# Patient Record
Sex: Male | Born: 1940
Health system: Southern US, Community
[De-identification: ages and names within clinical notes are randomized; demographics above are authoritative.]

## PROBLEM LIST (undated history)

## (undated) DIAGNOSIS — N400 Enlarged prostate without lower urinary tract symptoms: Secondary | ICD-10-CM

## (undated) DIAGNOSIS — R351 Nocturia: Secondary | ICD-10-CM

## (undated) DIAGNOSIS — K219 Gastro-esophageal reflux disease without esophagitis: Secondary | ICD-10-CM

## (undated) DIAGNOSIS — Z87448 Personal history of other diseases of urinary system: Secondary | ICD-10-CM

## (undated) DIAGNOSIS — C679 Malignant neoplasm of bladder, unspecified: Secondary | ICD-10-CM

## (undated) DIAGNOSIS — C419 Malignant neoplasm of bone and articular cartilage, unspecified: Secondary | ICD-10-CM

## (undated) DIAGNOSIS — Z860101 Personal history of adenomatous and serrated colon polyps: Secondary | ICD-10-CM

## (undated) DIAGNOSIS — R300 Dysuria: Secondary | ICD-10-CM

## (undated) DIAGNOSIS — Z8719 Personal history of other diseases of the digestive system: Secondary | ICD-10-CM

## (undated) DIAGNOSIS — Z86010 Personal history of colonic polyps: Secondary | ICD-10-CM

## (undated) DIAGNOSIS — Z8551 Personal history of malignant neoplasm of bladder: Secondary | ICD-10-CM

## (undated) HISTORY — DX: Benign prostatic hyperplasia without lower urinary tract symptoms: N40.0

## (undated) HISTORY — PX: COLONOSCOPY: SHX174

## (undated) HISTORY — DX: Gastro-esophageal reflux disease without esophagitis: K21.9

---

## 1995-08-17 HISTORY — PX: CARDIAC CATHETERIZATION: SHX172

## 2001-11-23 ENCOUNTER — Ambulatory Visit (HOSPITAL_COMMUNITY): Admission: RE | Admit: 2001-11-23 | Discharge: 2001-11-23 | Payer: Self-pay | Admitting: Family Medicine

## 2001-11-23 ENCOUNTER — Encounter: Payer: Self-pay | Admitting: Family Medicine

## 2002-11-01 ENCOUNTER — Emergency Department (HOSPITAL_COMMUNITY): Admission: EM | Admit: 2002-11-01 | Discharge: 2002-11-01 | Payer: Self-pay | Admitting: *Deleted

## 2002-12-02 ENCOUNTER — Emergency Department (HOSPITAL_COMMUNITY): Admission: EM | Admit: 2002-12-02 | Discharge: 2002-12-02 | Payer: Self-pay | Admitting: Internal Medicine

## 2003-04-23 ENCOUNTER — Encounter: Payer: Self-pay | Admitting: Internal Medicine

## 2003-04-23 ENCOUNTER — Ambulatory Visit (HOSPITAL_COMMUNITY): Admission: RE | Admit: 2003-04-23 | Discharge: 2003-04-23 | Payer: Self-pay | Admitting: Internal Medicine

## 2004-03-31 ENCOUNTER — Ambulatory Visit (HOSPITAL_COMMUNITY): Admission: RE | Admit: 2004-03-31 | Discharge: 2004-03-31 | Payer: Self-pay | Admitting: Family Medicine

## 2004-06-23 ENCOUNTER — Ambulatory Visit (HOSPITAL_COMMUNITY): Admission: RE | Admit: 2004-06-23 | Discharge: 2004-06-23 | Payer: Self-pay | Admitting: Family Medicine

## 2005-11-11 ENCOUNTER — Ambulatory Visit (HOSPITAL_COMMUNITY): Admission: RE | Admit: 2005-11-11 | Discharge: 2005-11-11 | Payer: Self-pay | Admitting: Family Medicine

## 2005-12-01 ENCOUNTER — Ambulatory Visit: Payer: Self-pay | Admitting: *Deleted

## 2005-12-14 ENCOUNTER — Ambulatory Visit: Payer: Self-pay | Admitting: *Deleted

## 2005-12-14 ENCOUNTER — Encounter (HOSPITAL_COMMUNITY): Admission: RE | Admit: 2005-12-14 | Discharge: 2006-01-13 | Payer: Self-pay | Admitting: *Deleted

## 2005-12-14 HISTORY — PX: CARDIOVASCULAR STRESS TEST: SHX262

## 2005-12-22 ENCOUNTER — Ambulatory Visit: Payer: Self-pay | Admitting: *Deleted

## 2005-12-25 ENCOUNTER — Ambulatory Visit: Payer: Self-pay | Admitting: Cardiology

## 2005-12-25 ENCOUNTER — Emergency Department (HOSPITAL_COMMUNITY): Admission: EM | Admit: 2005-12-25 | Discharge: 2005-12-25 | Payer: Self-pay | Admitting: Emergency Medicine

## 2006-09-20 ENCOUNTER — Ambulatory Visit (HOSPITAL_COMMUNITY): Admission: RE | Admit: 2006-09-20 | Discharge: 2006-09-20 | Payer: Self-pay | Admitting: General Surgery

## 2006-09-20 ENCOUNTER — Encounter (INDEPENDENT_AMBULATORY_CARE_PROVIDER_SITE_OTHER): Payer: Self-pay | Admitting: *Deleted

## 2007-01-30 ENCOUNTER — Ambulatory Visit (HOSPITAL_COMMUNITY): Admission: RE | Admit: 2007-01-30 | Discharge: 2007-01-30 | Payer: Self-pay | Admitting: Family Medicine

## 2007-07-07 ENCOUNTER — Ambulatory Visit (HOSPITAL_COMMUNITY): Admission: RE | Admit: 2007-07-07 | Discharge: 2007-07-07 | Payer: Self-pay | Admitting: Family Medicine

## 2007-07-17 ENCOUNTER — Encounter (HOSPITAL_COMMUNITY): Admission: RE | Admit: 2007-07-17 | Discharge: 2007-08-16 | Payer: Self-pay | Admitting: Family Medicine

## 2007-10-30 ENCOUNTER — Ambulatory Visit (HOSPITAL_COMMUNITY): Admission: RE | Admit: 2007-10-30 | Discharge: 2007-10-30 | Payer: Self-pay | Admitting: Family Medicine

## 2008-01-31 ENCOUNTER — Ambulatory Visit (HOSPITAL_COMMUNITY): Admission: RE | Admit: 2008-01-31 | Discharge: 2008-01-31 | Payer: Self-pay | Admitting: Family Medicine

## 2008-01-31 ENCOUNTER — Ambulatory Visit: Payer: Self-pay | Admitting: Cardiology

## 2008-01-31 ENCOUNTER — Encounter (INDEPENDENT_AMBULATORY_CARE_PROVIDER_SITE_OTHER): Payer: Self-pay | Admitting: Family Medicine

## 2008-01-31 HISTORY — PX: TRANSTHORACIC ECHOCARDIOGRAM: SHX275

## 2008-02-13 ENCOUNTER — Ambulatory Visit: Payer: Self-pay | Admitting: Cardiovascular Disease

## 2008-03-07 ENCOUNTER — Ambulatory Visit: Payer: Self-pay | Admitting: Internal Medicine

## 2008-03-07 DIAGNOSIS — J449 Chronic obstructive pulmonary disease, unspecified: Secondary | ICD-10-CM | POA: Insufficient documentation

## 2008-07-03 ENCOUNTER — Ambulatory Visit (HOSPITAL_COMMUNITY): Admission: RE | Admit: 2008-07-03 | Discharge: 2008-07-03 | Payer: Self-pay | Admitting: Family Medicine

## 2009-08-16 HISTORY — PX: CATARACT EXTRACTION W/ INTRAOCULAR LENS  IMPLANT, BILATERAL: SHX1307

## 2010-09-17 NOTE — Assessment & Plan Note (Signed)
Summary: Pulmonary Consultation/atypical copd   Visit Type:  Initial Consult Referred by:  Dr. Phillips Odor PCP:  Dr. Timoteo Expose  Chief Complaint:  Chest tightness.  History of Present Illness: 53 ywom quit smoking 1989 no trouble at all with breathing until about a year ago with a pattern of chest tightness that comes and goes and usually better within 15 min thinks he had a treadmill another one planned.  since 4/09 cough that goes away after a few minutes most ams white mucus assoc with chest tightness that resolves  after cough mucus up can do anything he wants with no chest tightness dancing,  up and down steps,  and digging ditches.  "Can do a treadmill all day":(but last did it over a month ago)  the pattern of tightness occurs4 to 5 days a week and may be related to when he eats his evening meal.  Today for example he reports not having a bout last ate at 430 yesterday   Pt denies any significant sore throat, nasal congestion or excess secretions, fever, chills, sweats, unintended wt loss, pleuritic or exertional cp, orthopnea pnd or leg swelling.  Pt also denies any obvious fluctuation in symptoms with weather or environmental change or other alleviating or aggravating factors.          Updated Prior Medication List: UROXATRAL 10 MG  XR24H-TAB (ALFUZOSIN HCL) two times a day  Current Allergies: No known allergies   Past Medical History:    Healthy   Family History:    no resp dz/atopy  Social History:    Retired    Married    Alcohol use-yes, 2-3 beers a day    Former smoker.  Quit 1989.  Smoked 1 ppd x 20 years.    Review of Systems  The patient denies anorexia, fever, weight loss, weight gain, vision loss, decreased hearing, hoarseness, chest pain, syncope, dyspnea on exertion, peripheral edema, headaches, hemoptysis, abdominal pain, melena, hematochezia, severe indigestion/heartburn, hematuria, incontinence, muscle weakness, suspicious skin lesions, transient  blindness, difficulty walking, depression, unusual weight change, abnormal bleeding, enlarged lymph nodes, and angioedema.     Vital Signs:  Patient Profile:   70 Years Old Male Weight:      206 pounds O2 Sat:      95 % O2 treatment:    Room Air Temp:     97.7 degrees F oral Pulse rate:   95 / minute BP sitting:   110 / 70  (left arm)  Vitals Entered By: Vernie Murders (March 07, 2008 10:57 AM)                 Physical Exam  elderly white male in no acute distress he seems somewhat frustrated by his inability to articulate his symptoms effectively HEENT mild turbinate edema.  Oropharynx no thrush or excess pnd or cobblestoning.  No JVD or cervical adenopathy. Mild accessory muscle hypertrophy. Trachea midline, nl thryroid. Chest was hyperinflated by percussion with diminished breath sounds and moderate increased exp time without wheeze. Hoover sign positive at mid inspiration. Regular rate and rhythm without murmur gallop or rub or increase P2.  Abd: no hsm, nl excursion. Ext warm without C,C or E.     CT of Chest  Procedure date:  01/31/2008  Findings:      calcified granuloma right lung with minimal bullous disease and scarring in the right lower lobe     Impression & Recommendations:  Problem # 1:  COPD UNSPECIFIED (ICD-496) he has clinical evidence of  COPD with bullous changes on his chest x-ray suggesting a a small component of emphysema.  Note however that his symptoms are markedly atypical and initially suggested angina to me until he told me that once he coughs up this morning mucus he has no symptoms for the rest of the day including with heavy activity.  However,  when respiratory symptoms begin well after a patient reports complete smoking cessation,  it is very hard to "blame" COPD  ie it doesn't make any more sense than hearing a  NASC[employer] driver wrecked his car while driving his kids to school or a Careers adviser sliced his hand off carving [place].  Once the high risk  activity stops,  the symptoms should not suddenly erupt.  If so, the differential diagnosis should include  obesity/deconditioning,  LPR/Reflux, CHF, or side effect of medications.  since he is the additional interesting history that  it makes a difference when he eats his evening meal as to whether he has morning symptoms, I suspect he is having nocturnal reflux and recommended a trial of Zegerid. If this eliminates  his symptoms, I gave him a prescription area otherwise at like to return here for a full set of PFTs and encouraged him to complete his cardiac workup. Orders: Consultation Level V [id number] [id number])   Medications Added to Medication List This Visit: 1)  Uroxatral 10 Mg Xr24h-tab (Alfuzosin hcl) .... Two times a day 2)  Zegerid 40-1100 Mg Caps (Omeprazole-sodium bicarbonate) .... One at bedtime   Patient Instructions: 1)  GERD (gastroesophageal reflux disease) was discussed. It is a common cause of respiratory symptoms. It commonly presents in the absence of heartburn. GERD can be treated with medication, but also with lifestyle changes including avoidance of late meals, excessive alcohol, smoking cessation, and avoid fatty foods, chocolate, peppermint, colas, red wine, and acidic juices such as orange juice.  2)  NO MINT OR MENTHOL PRODUCTS 3)  USE SUGARLESS CANDY INSTEAD (jolley ranchers)  4)  Please schedule a follow-up appointment in 3 weeks if not better   Prescriptions: ZEGERID 40-1100 MG  CAPS (OMEPRAZOLE-SODIUM BICARBONATE) one at bedtime  #30 x 0   Entered and Authorized by:   Nyoka Cowden MD   Signed by:   Nyoka Cowden MD on 03/07/2008   Method used:   Print then Give to Patient   RxID:   [id number]  ]

## 2010-12-29 NOTE — Assessment & Plan Note (Signed)
[facility]                       [place] CARDIOLOG[facility] OFFICE NOTE   SHAHEEN, MENDE                       MRN:          [id number]  DATE:02/13/2008                            DOB:          Jul 20, 1941    REFERRING PHYSICIAN:  Kirk Ruths, M.D.   Ms. Headen is seen today at the request of Dr. Regino Schultze.  He has previously  been seen by Dr. Dorethea Clan.  He apparently had heart catheterization at  [facility] 10 years ago which was normal.  He had some atypical chest pain 2  years ago and had a normal Myoview in 2007.  He has been having atypical  chest pain again.  It always recurs in the morning when he wakes up.  He  thinks he has some reflux.  He has had a cough for 3 months and had been  on antibiotics without much relief.  He says his allergies are worse  this year than ever before, particularly when cutting the grass.  The  patient is otherwise very active.  He golfs every day.  He shags.  He is  ambulatory and never gets any exertional chest pain.  There is no  associated diaphoresis.  He does have some mild shortness of breath with  his coughing, has not had any significant fevers.   He had previously been on metoprolol but this appears to have been  stopped.  I do not know whether it was due to some bronchospasm.   He has a history of hypertension.  Cholesterol status is unknown.  He is  a nondiabetic.   PAST MEDICAL HISTORY:  Otherwise remarkable for previous atypical chest  pain, question history of hiatal hernia and reflux.  Previous smoking.   ALLERGIES:  He denies any allergies.   MEDICATIONS:  He is currently not taking medication except for p.r.n.  ibuprofen.   FAMILY HISTORY:  Remarkable for both mother and father dying of  cirrhosis in their 18s.   SOCIAL HISTORY:  He is a nonsmoker, quitting over 20 years ago.  The  patient is retired.  He golfs on a regular basis.  He has one child who  lives in [place].  His wife's health is  fine.  He seems to be a fairly  outgoing guy but has a very unusual speech pattern with almost every  sentence beginning with listen here and let me tell you.   PHYSICAL EXAMINATION:  GENERAL:  Remarkable for healthy-appearing  elderly white male in no distress.  VITAL SIGNS:  His weight is 202, blood pressure is 150/90, pulse 80 and  regular, respiratory rate 14, afebrile.  HEENT:  Unremarkable.  NECK:  Carotids are normal without bruit.  No lymphadenopathy,  thyromegaly, or JVP elevation.  LUNGS:  Clear.  Good diaphragmatic motion.  No active wheezing.  S1 and  S2 with normal heart sounds.  PMI normal.  ABDOMEN:  Benign.  Bowel sounds positive.  No AAA.  No bruit.  No  hepatosplenomegaly or hepatojugular reflux.  EXTREMITIES:  Distal pulses are intact with minor varicosities.  NEUROLOGIC:  Nonfocal.  SKIN:  Warm and  dry.  MUSCULOSKELETAL:  No muscular weakness.   EKG is normal except for left axis deviation and compared to previous  EKG in April 2007.  There has been no significant change.   IMPRESSION:  1. Atypical chest pain, nonexertional, every morning, likely related      to reflux.  Consider starting H2 blocker and followup stress      Myoview.  2. Hypertension, previously treated with beta-blocker.  Consider      adding low-dose angiotensin-converting enzyme inhibitor and low-      salt diet.  3. Lower extremity varicosities, not symptomatic.  Consider referral      to [facility]; however, the patient would likely do fine with      observation and elevating the legs at the end of the day.  4. Overall I think the patient's the pain is atypical.  He certainly      is extremely active without chest pain with activity.  It may be      worthwhile to give him some block to elevate the head of his bed      with and start him on an H2 blocker.  5. Persistent upper respiratory illness with allergies and coughing      for last 3 months.  Consider chest CT to rule out  bronchiolitis      obliterans-organizing pneumonia.  Continue to follow up with Dr.      Regino Schultze in regards to atypical pneumonia versus flu.     Noralyn Pick. Eden Emms, MD, [facility]  Electronically Signed    PCN/MedQ  DD: 02/13/2008  DT: 02/14/2008  Job #: [id number]

## 2011-01-01 NOTE — Procedures (Signed)
NAMESALIOU, BARNIER NO.:  [id number]   MEDICAL RECORD NO.:  [id number]         PATIENT TYPE:  REC   LOCATION:  RAD                           FACILITY:  [facility]   PHYSICIAN:  Vida Roller, M.D.   DATE OF BIRTH:  [birth year]   DATE OF PROCEDURE:  DATE OF DISCHARGE:                                    STRESS TEST   HISTORY:  [name] is a 70 year old gentleman with normal coronaries by cath  10 years ago.  He now presents with complaints of exertional chest  discomfort.   Baseline data electrocardiogram reveals sinus rhythm at 74 beats per minute.  Blood pressure is 158/88.   Patient exercised for a total of 10 minutes 20 seconds Bruce protocol stage  IV at 12.8 METS, maximal heart rate was 142 which is at 91% of predicted  maximum, maximum blood pressure is 218/98 resolved down to 152/90 in  recovery.  The patient did describe substernal chest tightness rated as a  4/10 that resolved in less than 2 minutes in recovery.  EKG revealed few  PVCs, ventricular couplets, no ischemic changes were noted.   Final images and results are pending MD review.      [name], P.A. [facility]      Vida Roller, M.D.  Electronically Signed    AB/MEDQ  D:  12/14/2005  T:  12/14/2005  Job:  [id number]

## 2011-01-01 NOTE — H&P (Signed)
[name], [name] NO.:  [id number]   MEDICAL RECORD NO.:  [id number]           PATIENT TYPE:  AMB   LOCATION:  DAY                           FACILITY:  [facility]   PHYSICIAN:  Dalia Heading, M.D.  DATE OF BIRTH:  1940-09-16   DATE OF ADMISSION:  DATE OF DISCHARGE:  LH                              HISTORY & PHYSICAL   CHIEF COMPLAINT:  History of colon polyps.   HISTORY OF PRESENT ILLNESS:  The patient is a 70 year old white male who  presents for follow-up colonoscopy.  He last had a colonoscopy in 1997  for which he had some colon polyps removed.  He now presents for follow-  up colonoscopy.  Denies any nausea, vomiting, fever, diarrhea,  constipation, melena, or hematochezia.  There is no family history of  colon carcinoma.   PAST MEDICAL HISTORY:  Includes an anal fissure, neck difficulties.   CURRENT MEDICATIONS:  Benicar, Nexium, Vicoprofen.   ALLERGIES:  NO KNOWN DRUG ALLERGIES EXCEPT FOR STEROIDS WHICH MAKE HIM  LOOPY.   REVIEW OF SYSTEMS:  Noncontributory.   On physical examination, the patient is a well-developed, well-nourished  white male in no acute distress.  LUNGS:  Clear to auscultation with equal breath sounds bilaterally.  HEART EXAMINATION:  Reveals a regular rate and rhythm without S3, S4, or  murmurs.  ABDOMEN:  Is soft, nontender, nondistended.  RECTAL:  Examination was deferred to the procedure.   IMPRESSION:  History of colon polyps.   PLAN:  The patient is scheduled for colonoscopy on September 20, 2006.  Risks and benefits of the procedure including bleeding and perforation  were fully explained to the patient, who gave informed consent.      Dalia Heading, M.D.  Electronically Signed     MAJ/MEDQ  D:  09/08/2006  T:  09/08/2006  Job:  [id number]   cc:   [facility]  Fax: [phone]   Dalia Heading, M.D.  Fax: [phone]   Kirk Ruths, M.D.  Fax: [phone]

## 2011-01-01 NOTE — Consult Note (Signed)
[name], [name] NO.:  [id number]   MEDICAL RECORD NO.:  [id number]          PATIENT TYPE:  EMS   LOCATION:  MAJO                         FACILITY:  [facility]   PHYSICIAN:  Jesse Sans. Wall, M.D.   DATE OF BIRTH:  Sep 08, 1940   DATE OF CONSULTATION:  12/25/2005  DATE OF DISCHARGE:                                   CONSULTATION   REFERRING PHYSICIAN:  Dr. Carleene Cooper, ER physician.   PRIMARY CARE PHYSICIAN:  Dr. Dionicio Stall, [place]/Dr. Regino Schultze, [facility], Day.   REASON FOR CONSULTATION:  I was asked by Dr. Carleene Cooper to evaluate  [name], a delightful, very active, 70 year old, white male.  I met  [name] about 10 years ago when he had a catheterization which was  apparently normal.   He has been evaluate Dr. Dionicio Stall recently for some chest discomfort.  He  had a stress Myoview on an Dec 14, 2005, which showed him to exercise for 10  minutes and 20 seconds under Bruce protocol.  METS level was 12.8.  Heart  beat increased to 142 beats per minute.  Blood pressure was 158/88 and went  to 218/98.  He has some substernal chest tightness that recovered within 2  minutes.  He had no major are significant ST-segment changes.   He had a small area of inferior septal scar in the mid ventricle to the  apex.  There was mild inferior septal hypokinesia.  EF was 58%.  There is no  obvious ischemia.  It was felt to be a low-risk study with perhaps a remote  infarct.   He has had a lot of problems with his hiatal hernia and reflux.  About 4  weeks ago, he bent over after hitting golf balls and had some tightness in  the middle of his chest.  He subsequently, about 2 weeks ago, was putting  out mulch in his yard and had a similar event.  It came and went over a  period of a minute at a time.  This morning, he awoke with no chest pain,  but then as he was drinking a Coke, he began to have some tightness in his  chest and would come and go as  he drank the Coke.  It did not radiate into  his arm.  He did not have any shortness of breath or diaphoresis.   He does not take anything on regular basis for his reflux.  He uses p.r.n.  Rolaids.  He drinks a lot of sodas.  He denies any melena or  hematochezia.   He enjoys shagging and [employer] (that is a club) on a regular basis.  His  friend that is with him today says he can shag for hours without any chest  discomfort or shortness of breath.   CURRENT MEDICATIONS:  1.  He was placed on Benicar/hydrochlorothiazide 40/12.5 several days ago by      Dr. Dorethea Clan.  2.  He was also start on simvastatin 40 mg a day.   SOCIAL HISTORY:  He lives in [place].  FAMILY HISTORY:  Noncontributory.   PHYSICAL EXAMINATION:  VITAL SIGNS:  His blood pressure here was initially  114/69, temperature was 97.2, pulse 90 and regular, respiratory rate 20,  saturations were 95% on room air.  He has been stable in the emergency room  with his pressure.  GENERAL:  He is clearly in no acute distress.  SKIN:  His skin is warm and dry.  He is very pleasant.  NEUROLOGIC:  Intact.  HEENT:  His sclerae are clear.  PERLA.  Extraocular movements intact.  Facial symmetry is normal.  Carotids upstrokes are equal bilaterally without  bruits.  There is no JVD.  Thyroid is not enlarged.  Trachea is midline.  LUNGS:  Clear.  HEART:  Heart reveals a regular rate and rhythm without murmur or gallop.  ABDOMEN:  Soft.  There is no tenderness.  There is no epigastric bruit.  Bowel sounds are present.  There is no hepatosplenomegaly or tenderness.  EXTREMITIES:  There is no cyanosis, clubbing or edema.  Pulses are brisk.   LABORATORY DATA AND X-RAY FINDINGS:  Laboratory data is unremarkable  including cardiac enzyme care markers.  Electrolytes are normal.   EKG is completely normal.   ASSESSMENT:  I have had a long talk with [name] today.  I think most of  his symptoms are reflux.  He probably has had a remote  infarct in the past  and I agree with aggressive secondary preventative care implemented by Dr.  Dorethea Clan.  I have gone over extensively what to look out for as far as an  acute ischemic event and how to respond by calling 911.  I told him to be  aware of this, even though right now he seems to be perfectly stable.   RECOMMENDATIONS:  I am going to send him home from the emergency room.  I  have given him a Prevacid Solu-tab and gave him a prescription for Nexium 40  mg a day, #30, with one refill.  I have asked him to follow up with Dr.  Regino Schultze and Dr. Dorethea Clan.      Thomas C. Wall, M.D.  Electronically Signed     TCW/MEDQ  D:  12/25/2005  T:  12/27/2005  Job:  [id number]   cc:   Vida Roller, M.D.  Fax: [phone]   Kirk Ruths, M.D.  Fax: [phone]

## 2011-04-12 ENCOUNTER — Ambulatory Visit (HOSPITAL_COMMUNITY)
Admission: RE | Admit: 2011-04-12 | Discharge: 2011-04-12 | Disposition: A | Discharge status: Home / Self Care | Payer: BC Managed Care – PPO | Source: Ambulatory Visit | Attending: Family Medicine | Admitting: Family Medicine

## 2011-04-12 ENCOUNTER — Encounter (HOSPITAL_COMMUNITY): Payer: Self-pay

## 2011-04-12 ENCOUNTER — Other Ambulatory Visit (HOSPITAL_COMMUNITY): Payer: Self-pay | Admitting: Family Medicine

## 2011-04-12 DIAGNOSIS — R5381 Other malaise: Secondary | ICD-10-CM | POA: Insufficient documentation

## 2011-04-12 DIAGNOSIS — R059 Cough, unspecified: Secondary | ICD-10-CM

## 2011-04-12 DIAGNOSIS — I10 Essential (primary) hypertension: Secondary | ICD-10-CM

## 2011-04-12 DIAGNOSIS — R05 Cough: Secondary | ICD-10-CM

## 2011-04-12 DIAGNOSIS — R5383 Other fatigue: Secondary | ICD-10-CM

## 2011-08-24 DIAGNOSIS — I10 Essential (primary) hypertension: Secondary | ICD-10-CM | POA: Diagnosis not present

## 2011-08-24 DIAGNOSIS — Z6829 Body mass index (BMI) 29.0-29.9, adult: Secondary | ICD-10-CM | POA: Diagnosis not present

## 2011-08-24 DIAGNOSIS — Z23 Encounter for immunization: Secondary | ICD-10-CM | POA: Diagnosis not present

## 2011-08-24 DIAGNOSIS — M109 Gout, unspecified: Secondary | ICD-10-CM | POA: Diagnosis not present

## 2011-10-25 DIAGNOSIS — J069 Acute upper respiratory infection, unspecified: Secondary | ICD-10-CM | POA: Diagnosis not present

## 2011-10-25 DIAGNOSIS — G8929 Other chronic pain: Secondary | ICD-10-CM | POA: Diagnosis not present

## 2011-10-25 DIAGNOSIS — Z6829 Body mass index (BMI) 29.0-29.9, adult: Secondary | ICD-10-CM | POA: Diagnosis not present

## 2011-10-25 DIAGNOSIS — R059 Cough, unspecified: Secondary | ICD-10-CM | POA: Diagnosis not present

## 2011-10-25 DIAGNOSIS — N400 Enlarged prostate without lower urinary tract symptoms: Secondary | ICD-10-CM | POA: Diagnosis not present

## 2011-10-25 DIAGNOSIS — R05 Cough: Secondary | ICD-10-CM | POA: Diagnosis not present

## 2012-02-01 DIAGNOSIS — M109 Gout, unspecified: Secondary | ICD-10-CM | POA: Diagnosis not present

## 2012-02-01 DIAGNOSIS — N400 Enlarged prostate without lower urinary tract symptoms: Secondary | ICD-10-CM | POA: Diagnosis not present

## 2012-02-01 DIAGNOSIS — Z6829 Body mass index (BMI) 29.0-29.9, adult: Secondary | ICD-10-CM | POA: Diagnosis not present

## 2012-02-01 DIAGNOSIS — IMO0002 Reserved for concepts with insufficient information to code with codable children: Secondary | ICD-10-CM | POA: Diagnosis not present

## 2012-02-11 DIAGNOSIS — M5030 Other cervical disc degeneration, unspecified cervical region: Secondary | ICD-10-CM | POA: Diagnosis not present

## 2012-02-11 DIAGNOSIS — M542 Cervicalgia: Secondary | ICD-10-CM | POA: Diagnosis not present

## 2012-02-11 DIAGNOSIS — M47812 Spondylosis without myelopathy or radiculopathy, cervical region: Secondary | ICD-10-CM | POA: Diagnosis not present

## 2012-02-16 ENCOUNTER — Other Ambulatory Visit (HOSPITAL_COMMUNITY): Payer: Self-pay | Admitting: Neurosurgery

## 2012-02-16 DIAGNOSIS — M5030 Other cervical disc degeneration, unspecified cervical region: Secondary | ICD-10-CM

## 2012-02-16 DIAGNOSIS — M542 Cervicalgia: Secondary | ICD-10-CM

## 2012-02-16 DIAGNOSIS — M47812 Spondylosis without myelopathy or radiculopathy, cervical region: Secondary | ICD-10-CM

## 2012-02-22 ENCOUNTER — Ambulatory Visit (HOSPITAL_COMMUNITY)
Admission: RE | Admit: 2012-02-22 | Discharge: 2012-02-22 | Disposition: A | Discharge status: Home / Self Care | Payer: BC Managed Care – PPO | Source: Ambulatory Visit | Attending: Neurosurgery | Admitting: Neurosurgery

## 2012-02-22 DIAGNOSIS — M5030 Other cervical disc degeneration, unspecified cervical region: Secondary | ICD-10-CM

## 2012-02-22 DIAGNOSIS — M47812 Spondylosis without myelopathy or radiculopathy, cervical region: Secondary | ICD-10-CM

## 2012-02-22 DIAGNOSIS — M542 Cervicalgia: Secondary | ICD-10-CM | POA: Insufficient documentation

## 2012-02-22 DIAGNOSIS — M5020 Other cervical disc displacement, unspecified cervical region: Secondary | ICD-10-CM | POA: Insufficient documentation

## 2012-02-22 DIAGNOSIS — M4802 Spinal stenosis, cervical region: Secondary | ICD-10-CM | POA: Diagnosis not present

## 2012-03-03 DIAGNOSIS — M47812 Spondylosis without myelopathy or radiculopathy, cervical region: Secondary | ICD-10-CM | POA: Diagnosis not present

## 2012-03-06 ENCOUNTER — Other Ambulatory Visit: Payer: Self-pay | Admitting: Neurosurgery

## 2012-05-04 ENCOUNTER — Encounter (HOSPITAL_COMMUNITY): Admission: RE | Admit: 2012-05-04 | Payer: BC Managed Care – PPO | Source: Ambulatory Visit

## 2012-05-24 DIAGNOSIS — Z23 Encounter for immunization: Secondary | ICD-10-CM | POA: Diagnosis not present

## 2012-06-06 ENCOUNTER — Other Ambulatory Visit (HOSPITAL_COMMUNITY): Payer: BC Managed Care – PPO

## 2012-06-12 ENCOUNTER — Encounter (HOSPITAL_COMMUNITY): Admission: RE | Payer: Self-pay | Source: Ambulatory Visit

## 2012-06-12 ENCOUNTER — Ambulatory Visit (HOSPITAL_COMMUNITY): Admission: RE | Admit: 2012-06-12 | Payer: BC Managed Care – PPO | Source: Ambulatory Visit | Admitting: Neurosurgery

## 2012-06-12 SURGERY — ANTERIOR CERVICAL DECOMPRESSION/DISCECTOMY FUSION 1 LEVEL
Anesthesia: General

## 2012-06-13 DIAGNOSIS — L821 Other seborrheic keratosis: Secondary | ICD-10-CM | POA: Diagnosis not present

## 2012-06-13 DIAGNOSIS — D235 Other benign neoplasm of skin of trunk: Secondary | ICD-10-CM | POA: Diagnosis not present

## 2012-06-13 DIAGNOSIS — L570 Actinic keratosis: Secondary | ICD-10-CM | POA: Diagnosis not present

## 2012-08-14 DIAGNOSIS — Z6829 Body mass index (BMI) 29.0-29.9, adult: Secondary | ICD-10-CM | POA: Diagnosis not present

## 2012-08-14 DIAGNOSIS — E785 Hyperlipidemia, unspecified: Secondary | ICD-10-CM | POA: Diagnosis not present

## 2012-08-14 DIAGNOSIS — G8929 Other chronic pain: Secondary | ICD-10-CM | POA: Diagnosis not present

## 2012-08-14 DIAGNOSIS — I10 Essential (primary) hypertension: Secondary | ICD-10-CM | POA: Diagnosis not present

## 2012-09-19 DIAGNOSIS — I10 Essential (primary) hypertension: Secondary | ICD-10-CM | POA: Diagnosis not present

## 2012-09-19 DIAGNOSIS — G8929 Other chronic pain: Secondary | ICD-10-CM | POA: Diagnosis not present

## 2012-09-19 DIAGNOSIS — L821 Other seborrheic keratosis: Secondary | ICD-10-CM | POA: Diagnosis not present

## 2012-09-19 DIAGNOSIS — L259 Unspecified contact dermatitis, unspecified cause: Secondary | ICD-10-CM | POA: Diagnosis not present

## 2012-09-19 DIAGNOSIS — E785 Hyperlipidemia, unspecified: Secondary | ICD-10-CM | POA: Diagnosis not present

## 2012-12-26 DIAGNOSIS — L570 Actinic keratosis: Secondary | ICD-10-CM | POA: Diagnosis not present

## 2012-12-26 DIAGNOSIS — D235 Other benign neoplasm of skin of trunk: Secondary | ICD-10-CM | POA: Diagnosis not present

## 2012-12-26 DIAGNOSIS — L821 Other seborrheic keratosis: Secondary | ICD-10-CM | POA: Diagnosis not present

## 2013-01-05 DIAGNOSIS — N400 Enlarged prostate without lower urinary tract symptoms: Secondary | ICD-10-CM | POA: Diagnosis not present

## 2013-01-05 DIAGNOSIS — Z6828 Body mass index (BMI) 28.0-28.9, adult: Secondary | ICD-10-CM | POA: Diagnosis not present

## 2013-01-05 DIAGNOSIS — K5289 Other specified noninfective gastroenteritis and colitis: Secondary | ICD-10-CM | POA: Diagnosis not present

## 2013-01-05 DIAGNOSIS — I10 Essential (primary) hypertension: Secondary | ICD-10-CM | POA: Diagnosis not present

## 2013-05-23 DIAGNOSIS — Z23 Encounter for immunization: Secondary | ICD-10-CM | POA: Diagnosis not present

## 2013-09-11 DIAGNOSIS — R059 Cough, unspecified: Secondary | ICD-10-CM | POA: Diagnosis not present

## 2013-09-11 DIAGNOSIS — Z6829 Body mass index (BMI) 29.0-29.9, adult: Secondary | ICD-10-CM | POA: Diagnosis not present

## 2013-09-11 DIAGNOSIS — R05 Cough: Secondary | ICD-10-CM | POA: Diagnosis not present

## 2013-09-11 DIAGNOSIS — I10 Essential (primary) hypertension: Secondary | ICD-10-CM | POA: Diagnosis not present

## 2013-09-11 DIAGNOSIS — G8929 Other chronic pain: Secondary | ICD-10-CM | POA: Diagnosis not present

## 2013-11-20 DIAGNOSIS — G8929 Other chronic pain: Secondary | ICD-10-CM | POA: Diagnosis not present

## 2013-11-20 DIAGNOSIS — Z6829 Body mass index (BMI) 29.0-29.9, adult: Secondary | ICD-10-CM | POA: Diagnosis not present

## 2013-11-20 DIAGNOSIS — M779 Enthesopathy, unspecified: Secondary | ICD-10-CM | POA: Diagnosis not present

## 2013-12-14 DIAGNOSIS — M109 Gout, unspecified: Secondary | ICD-10-CM | POA: Diagnosis not present

## 2014-01-01 DIAGNOSIS — R5383 Other fatigue: Secondary | ICD-10-CM | POA: Diagnosis not present

## 2014-01-01 DIAGNOSIS — R5381 Other malaise: Secondary | ICD-10-CM | POA: Diagnosis not present

## 2014-01-01 DIAGNOSIS — Z6829 Body mass index (BMI) 29.0-29.9, adult: Secondary | ICD-10-CM | POA: Diagnosis not present

## 2014-01-01 DIAGNOSIS — M109 Gout, unspecified: Secondary | ICD-10-CM | POA: Diagnosis not present

## 2014-04-09 DIAGNOSIS — L258 Unspecified contact dermatitis due to other agents: Secondary | ICD-10-CM | POA: Diagnosis not present

## 2014-06-12 DIAGNOSIS — I10 Essential (primary) hypertension: Secondary | ICD-10-CM | POA: Diagnosis not present

## 2014-06-12 DIAGNOSIS — Z6829 Body mass index (BMI) 29.0-29.9, adult: Secondary | ICD-10-CM | POA: Diagnosis not present

## 2014-06-12 DIAGNOSIS — G894 Chronic pain syndrome: Secondary | ICD-10-CM | POA: Diagnosis not present

## 2014-06-12 DIAGNOSIS — J0100 Acute maxillary sinusitis, unspecified: Secondary | ICD-10-CM | POA: Diagnosis not present

## 2014-06-12 DIAGNOSIS — Z23 Encounter for immunization: Secondary | ICD-10-CM | POA: Diagnosis not present

## 2014-06-12 DIAGNOSIS — J209 Acute bronchitis, unspecified: Secondary | ICD-10-CM | POA: Diagnosis not present

## 2014-08-12 ENCOUNTER — Other Ambulatory Visit (HOSPITAL_COMMUNITY): Payer: Self-pay | Admitting: Internal Medicine

## 2014-08-12 DIAGNOSIS — G894 Chronic pain syndrome: Secondary | ICD-10-CM | POA: Diagnosis not present

## 2014-08-12 DIAGNOSIS — R1032 Left lower quadrant pain: Secondary | ICD-10-CM | POA: Diagnosis not present

## 2014-08-12 DIAGNOSIS — R1084 Generalized abdominal pain: Secondary | ICD-10-CM

## 2014-08-12 DIAGNOSIS — I10 Essential (primary) hypertension: Secondary | ICD-10-CM | POA: Diagnosis not present

## 2014-08-12 DIAGNOSIS — E663 Overweight: Secondary | ICD-10-CM | POA: Diagnosis not present

## 2014-08-12 DIAGNOSIS — K219 Gastro-esophageal reflux disease without esophagitis: Secondary | ICD-10-CM | POA: Diagnosis not present

## 2014-08-12 DIAGNOSIS — K5900 Constipation, unspecified: Secondary | ICD-10-CM | POA: Diagnosis not present

## 2014-08-12 DIAGNOSIS — Z6829 Body mass index (BMI) 29.0-29.9, adult: Secondary | ICD-10-CM | POA: Diagnosis not present

## 2014-08-12 DIAGNOSIS — R109 Unspecified abdominal pain: Secondary | ICD-10-CM | POA: Diagnosis not present

## 2014-08-13 ENCOUNTER — Ambulatory Visit (HOSPITAL_COMMUNITY)
Admission: RE | Admit: 2014-08-13 | Discharge: 2014-08-13 | Disposition: A | Discharge status: Home / Self Care | Payer: Medicare Other | Source: Ambulatory Visit | Attending: Internal Medicine | Admitting: Internal Medicine

## 2014-08-13 DIAGNOSIS — N3289 Other specified disorders of bladder: Secondary | ICD-10-CM | POA: Diagnosis not present

## 2014-08-13 DIAGNOSIS — R197 Diarrhea, unspecified: Secondary | ICD-10-CM | POA: Insufficient documentation

## 2014-08-13 DIAGNOSIS — N261 Atrophy of kidney (terminal): Secondary | ICD-10-CM | POA: Diagnosis not present

## 2014-08-13 DIAGNOSIS — I2510 Atherosclerotic heart disease of native coronary artery without angina pectoris: Secondary | ICD-10-CM | POA: Diagnosis not present

## 2014-08-13 DIAGNOSIS — R1084 Generalized abdominal pain: Secondary | ICD-10-CM | POA: Insufficient documentation

## 2014-08-13 DIAGNOSIS — R109 Unspecified abdominal pain: Secondary | ICD-10-CM | POA: Diagnosis not present

## 2014-08-13 DIAGNOSIS — R102 Pelvic and perineal pain: Secondary | ICD-10-CM | POA: Diagnosis not present

## 2014-08-13 MED ORDER — IOHEXOL 300 MG/ML  SOLN
100.0000 mL | Freq: Once | INTRAMUSCULAR | Status: AC | PRN
  Administered 2014-08-13: 100 mL via INTRAVENOUS

## 2014-08-28 ENCOUNTER — Telehealth: Payer: Self-pay | Admitting: Gastroenterology

## 2014-08-28 NOTE — Telephone Encounter (Signed)
Rec'd from [place] forward 5 pages to Dr.Stark

## 2014-09-10 ENCOUNTER — Encounter: Payer: Self-pay | Admitting: Gastroenterology

## 2014-09-10 ENCOUNTER — Ambulatory Visit (INDEPENDENT_AMBULATORY_CARE_PROVIDER_SITE_OTHER): Payer: Medicare Other | Admitting: Gastroenterology

## 2014-09-10 VITALS — BP 132/72 | HR 68 | Ht 70.0 in | Wt 212.1 lb

## 2014-09-10 DIAGNOSIS — K219 Gastro-esophageal reflux disease without esophagitis: Secondary | ICD-10-CM

## 2014-09-10 DIAGNOSIS — Z86010 Personal history of colonic polyps: Secondary | ICD-10-CM

## 2014-09-10 DIAGNOSIS — R933 Abnormal findings on diagnostic imaging of other parts of digestive tract: Secondary | ICD-10-CM | POA: Diagnosis not present

## 2014-09-10 MED ORDER — PEG-KCL-NACL-NASULF-NA ASC-C 100 G PO SOLR: 1.0000 | Freq: Once | ORAL | Status: DC

## 2014-09-10 NOTE — Progress Notes (Signed)
History of Present Illness: This is a 74 year old male referred by Dr. Gerarda Fraction for evaluation of periumbilical abdominal pain and an abnormal CT scan. He relates about 6 weeks ago periumbilical abdominal pain started occurring intermittently. He was evaluated by Dr. Riley Kill and underwent CT scan below findings. He was advised to discontinue all milk products and after discontinuing ice cream his symptoms completely resolved. Currently has no gastrointestinal complaints. He previously underwent colonoscopy by Dr. Arnoldo Morale in February 2008 showing small adenomatous colon polyp. 3 year surveillance colonoscopy was recommended however that has not been completed. Denies weight loss, constipation, diarrhea, change in stool caliber, melena, hematochezia, nausea, vomiting, dysphagia, reflux symptoms, chest pain.  ABD/PELVIC CT 08/13/14 IMPRESSION: Suggestion of mild diffuse wall thickening of proximal and mid small bowel loops -question enteritis of uncertain etiology. No evidence of abscess, pneumoperitoneum or bowel obstruction. The visualized mesenteric vasculature are patent.  Two areas of focal left bladder wall thickening. Bladder neoplasm not excluded and urologic consultation is recommended.  Coronary artery disease and mild bilateral renal cortical atrophy.   Allergies  Allergen Reactions  . Ciprofloxacin Other (See Comments)    Pt felt bloated   Outpatient Prescriptions Prior to Visit  Medication Sig Dispense Refill  . HYDROcodone-ibuprofen (VICOPROFEN) 7.5-200 MG per tablet Take 1 tablet by mouth every 6 (six) hours as needed for moderate pain.    . tamsulosin (FLOMAX) 0.4 MG CAPS capsule Take 0.4 mg by mouth 2 (two) times daily.    Marland Kitchen allopurinol (ZYLOPRIM) 100 MG tablet Take 100 mg by mouth daily.    Marland Kitchen aspirin 81 MG tablet Take 81 mg by mouth daily.    Marland Kitchen olmesartan-hydrochlorothiazide (BENICAR HCT) 40-12.5 MG per tablet Take 1 tablet by mouth daily.    Marland Kitchen omeprazole (PRILOSEC) 40 MG  capsule Take 40 mg by mouth daily.    . predniSONE (DELTASONE) 5 MG tablet Take 5 mg by mouth daily with breakfast.    . sildenafil (VIAGRA) 100 MG tablet Take 100 mg by mouth daily as needed for erectile dysfunction.     No facility-administered medications prior to visit.   Past Medical History  Diagnosis Date  . Hypertension   . Tubular adenoma of colon 2008  . BPH (benign prostatic hypertrophy)   . GERD (gastroesophageal reflux disease)    Past Surgical History  Procedure Laterality Date  . No past surgeries     History   Social History  . Marital Status: Married    Spouse Name: N/A    Number of Children: 1  . Years of Education: N/A   Occupational History  . Retired    Social History Main Topics  . Smoking status: Former Smoker    Types: Cigarettes  . Smokeless tobacco: Never Used  . Alcohol Use: 0.0 oz/week    0 Not specified per week     Comment: 2 beers a night  . Drug Use: No  . Sexual Activity: None   Other Topics Concern  . None   Social History Narrative   Family History  Problem Relation Age of Onset  . Colon cancer Neg Hx   . Colon polyps Neg Hx   . Diabetes Neg Hx   . Kidney disease Neg Hx   . Esophageal cancer Neg Hx   . Heart disease Neg Hx   . Gallbladder disease Neg Hx      Review of Systems: Pertinent positive and negative review of systems were noted in the above HPI section. All other  review of systems were otherwise negative.    Physical Exam: General: Well developed , well nourished, no acute distress Head: Normocephalic and atraumatic Eyes:  sclerae anicteric, EOMI Ears: Normal auditory acuity Mouth: No deformity or lesions Neck: Supple, no masses or thyromegaly Lungs: Clear throughout to auscultation Heart: Regular rate and rhythm; no murmurs, rubs or bruits Abdomen: Soft, non tender and non distended. No masses, hepatosplenomegaly or hernias noted. Normal Bowel sounds Rectal:  Deferred to colonoscopy Musculoskeletal:  Symmetrical with no gross deformities  Skin: No lesions on visible extremities Pulses:  Normal pulses noted Extremities: No clubbing, cyanosis, edema or deformities noted Neurological: Alert oriented x 4, grossly nonfocal Cervical Nodes:  No significant cervical adenopathy Inguinal Nodes: No significant inguinal adenopathy Psychological:  Alert and cooperative. Normal mood and affect  Assessment and Recommendations:  1. Periumbilical abdominal pain, resolved. Lactose intolerance. Abnormal CT scan of small bowel. He may have had a self-limited enteritis or the findings may be a CT artifact. Continue to avoid milk products. Schedule CT enteroscopy in several weeks to allow time for interval healing of a suspected self-limited process.  2. Personal history of adenomatous colon polyps. Overdue for surveillance colonoscopy. The risks, benefits, and alternatives to colonoscopy with possible biopsy and possible polypectomy were discussed with the patient and they consent to proceed.

## 2014-09-10 NOTE — Patient Instructions (Addendum)
Please come directly to the basement the week prior to your CT scan to have blood work done.   You have been scheduled for a CT enterography scan of the abdomen and pelvis at [facility] are scheduled on 10/29/14 at 10:15am. You should arrive at 9:00am . Please follow the written instructions below on the day of your exam:  WARNING: IF YOU ARE ALLERGIC TO IODINE/X-RAY DYE, PLEASE NOTIFY RADIOLOGY IMMEDIATELY AT [phone]! YOU WILL BE GIVEN A 13 HOUR PREMEDICATION PREP.  1) Do not eat or drink anything after midnight.  The purpose of you drinking the oral contrast is to aid in the visualization of your intestinal tract. The contrast solution may cause some diarrhea. Before your exam is started, you will be given a small amount of fluid to drink. Depending on your individual set of symptoms, you may also receive an intravenous injection of x-ray contrast/dye. Plan on being at [facility] for 30 minutes or long, depending on the type of exam you are having performed.   If you have any questions regarding your exam or if you need to reschedule, you may call the CT department at [phone] between the hours of 8:00 am and 5:00 pm, Monday-Friday.  ________________________________________________________________________  [name] have been scheduled for a colonoscopy. Please follow written instructions given to you at your visit today.  Please pick up your prep kit at the pharmacy within the next 1-3 days. If you use inhalers (even only as needed), please bring them with you on the day of your procedure. Your physician has requested that you go to www.startemmi.com and enter the access code given to you at your visit today. This web site gives a general overview about your procedure. However, you should still follow specific instructions given to you by our office regarding your preparation for the procedure.   Please remain on a Lactose free diet.   Thank you for choosing me  and [facility].  [name] Dagoberto Ligas., MD., [facility]  cc: [name], MD

## 2014-09-17 ENCOUNTER — Encounter: Payer: Self-pay | Admitting: Gastroenterology

## 2014-09-17 NOTE — Telephone Encounter (Signed)
Error

## 2014-10-17 ENCOUNTER — Encounter: Payer: Medicare Other | Admitting: Gastroenterology

## 2014-10-24 ENCOUNTER — Other Ambulatory Visit (INDEPENDENT_AMBULATORY_CARE_PROVIDER_SITE_OTHER): Payer: Medicare Other

## 2014-10-24 DIAGNOSIS — R933 Abnormal findings on diagnostic imaging of other parts of digestive tract: Secondary | ICD-10-CM | POA: Diagnosis not present

## 2014-10-24 LAB — CREATININE, SERUM: Creatinine, Ser: 1.06 mg/dL (ref 0.40–1.50)

## 2014-10-24 LAB — BUN: BUN: 9 mg/dL (ref 6–23)

## 2014-10-29 ENCOUNTER — Ambulatory Visit (HOSPITAL_COMMUNITY)
Admission: RE | Admit: 2014-10-29 | Discharge: 2014-10-29 | Disposition: A | Discharge status: Home / Self Care | Payer: Medicare Other | Source: Ambulatory Visit | Attending: Gastroenterology | Admitting: Gastroenterology

## 2014-10-29 ENCOUNTER — Other Ambulatory Visit: Payer: Medicare Other

## 2014-10-29 DIAGNOSIS — R933 Abnormal findings on diagnostic imaging of other parts of digestive tract: Secondary | ICD-10-CM

## 2014-11-05 ENCOUNTER — Encounter: Payer: Medicare Other | Admitting: Gastroenterology

## 2014-11-05 ENCOUNTER — Ambulatory Visit (AMBULATORY_SURGERY_CENTER): Payer: Medicare Other | Admitting: Gastroenterology

## 2014-11-05 ENCOUNTER — Encounter: Payer: Self-pay | Admitting: Gastroenterology

## 2014-11-05 VITALS — BP 138/84 | HR 68 | Temp 98.3°F | Resp 20 | Ht 70.0 in | Wt 212.0 lb

## 2014-11-05 DIAGNOSIS — D123 Benign neoplasm of transverse colon: Secondary | ICD-10-CM | POA: Diagnosis not present

## 2014-11-05 DIAGNOSIS — D122 Benign neoplasm of ascending colon: Secondary | ICD-10-CM

## 2014-11-05 DIAGNOSIS — Z86010 Personal history of colonic polyps: Secondary | ICD-10-CM | POA: Diagnosis not present

## 2014-11-05 DIAGNOSIS — D124 Benign neoplasm of descending colon: Secondary | ICD-10-CM

## 2014-11-05 DIAGNOSIS — D125 Benign neoplasm of sigmoid colon: Secondary | ICD-10-CM | POA: Diagnosis not present

## 2014-11-05 DIAGNOSIS — D120 Benign neoplasm of cecum: Secondary | ICD-10-CM

## 2014-11-05 DIAGNOSIS — J449 Chronic obstructive pulmonary disease, unspecified: Secondary | ICD-10-CM | POA: Diagnosis not present

## 2014-11-05 DIAGNOSIS — I10 Essential (primary) hypertension: Secondary | ICD-10-CM | POA: Diagnosis not present

## 2014-11-05 MED ORDER — SODIUM CHLORIDE 0.9 % IV SOLN: 500.0000 mL | INTRAVENOUS | Status: DC

## 2014-11-05 NOTE — Patient Instructions (Signed)

## 2014-11-05 NOTE — Progress Notes (Signed)
To recovery, report to Hodges, RN, VSS 

## 2014-11-05 NOTE — Progress Notes (Signed)
Called to room to assist during endoscopic procedure.  Patient ID and intended procedure confirmed with present staff. Received instructions for my participation in the procedure from the performing physician.  

## 2014-11-06 ENCOUNTER — Telehealth: Payer: Self-pay | Admitting: *Deleted

## 2014-11-06 NOTE — Telephone Encounter (Signed)
  Follow up Call-  Call back number 11/05/2014  Post procedure Call Back phone  # [phone]  Permission to leave phone message Yes     Patient questions:  Do you have a fever, pain , or abdominal swelling? No. Pain Score  0 *  Have you tolerated food without any problems? Yes.    Have you been able to return to your normal activities? Yes.    Do you have any questions about your discharge instructions: Diet   No. Medications  No. Follow up visit  No.  Do you have questions or concerns about your Care? No.  Actions: * If pain score is 4 or above: No action needed, pain <4.

## 2014-11-07 ENCOUNTER — Ambulatory Visit (HOSPITAL_COMMUNITY): Payer: No Typology Code available for payment source

## 2014-11-14 ENCOUNTER — Encounter: Payer: Self-pay | Admitting: Gastroenterology

## 2014-11-26 ENCOUNTER — Ambulatory Visit (HOSPITAL_COMMUNITY)
Admission: RE | Admit: 2014-11-26 | Discharge: 2014-11-26 | Disposition: A | Discharge status: Home / Self Care | Payer: Medicare Other | Source: Ambulatory Visit | Attending: Gastroenterology | Admitting: Gastroenterology

## 2014-11-26 DIAGNOSIS — R933 Abnormal findings on diagnostic imaging of other parts of digestive tract: Secondary | ICD-10-CM | POA: Diagnosis not present

## 2014-11-26 DIAGNOSIS — R109 Unspecified abdominal pain: Secondary | ICD-10-CM | POA: Insufficient documentation

## 2014-11-26 DIAGNOSIS — K760 Fatty (change of) liver, not elsewhere classified: Secondary | ICD-10-CM | POA: Diagnosis not present

## 2014-11-26 DIAGNOSIS — K7689 Other specified diseases of liver: Secondary | ICD-10-CM | POA: Diagnosis not present

## 2014-11-26 MED ORDER — BARIUM SULFATE 0.1 % PO SUSP: 1350.0000 mL | Freq: Once | ORAL | Status: DC

## 2014-11-26 MED ORDER — IOHEXOL 300 MG/ML  SOLN
125.0000 mL | Freq: Once | INTRAMUSCULAR | Status: AC | PRN
  Administered 2014-11-26: 125 mL via INTRAVENOUS

## 2014-11-27 ENCOUNTER — Other Ambulatory Visit: Payer: Self-pay

## 2014-11-27 DIAGNOSIS — R933 Abnormal findings on diagnostic imaging of other parts of digestive tract: Secondary | ICD-10-CM

## 2014-12-03 DIAGNOSIS — N323 Diverticulum of bladder: Secondary | ICD-10-CM | POA: Diagnosis not present

## 2014-12-03 DIAGNOSIS — D494 Neoplasm of unspecified behavior of bladder: Secondary | ICD-10-CM | POA: Diagnosis not present

## 2014-12-04 ENCOUNTER — Ambulatory Visit (AMBULATORY_SURGERY_CENTER): Payer: Self-pay | Admitting: *Deleted

## 2014-12-04 VITALS — Ht 70.0 in | Wt 212.0 lb

## 2014-12-04 DIAGNOSIS — R933 Abnormal findings on diagnostic imaging of other parts of digestive tract: Secondary | ICD-10-CM

## 2014-12-04 NOTE — Progress Notes (Signed)
Patient denies any allergies to eggs or soy. Patient denies any problems with anesthesia/sedation. Patient denies any oxygen use at home and does not take any diet/weight loss medications. No computer access at this time, "computer broke" per pt.

## 2014-12-05 ENCOUNTER — Ambulatory Visit (AMBULATORY_SURGERY_CENTER): Payer: Medicare Other | Admitting: Gastroenterology

## 2014-12-05 ENCOUNTER — Encounter: Payer: Self-pay | Admitting: Gastroenterology

## 2014-12-05 VITALS — BP 155/81 | HR 76 | Temp 97.1°F | Resp 24 | Ht 70.0 in | Wt 212.0 lb

## 2014-12-05 DIAGNOSIS — K209 Esophagitis, unspecified without bleeding: Secondary | ICD-10-CM

## 2014-12-05 DIAGNOSIS — R198 Other specified symptoms and signs involving the digestive system and abdomen: Secondary | ICD-10-CM

## 2014-12-05 DIAGNOSIS — R933 Abnormal findings on diagnostic imaging of other parts of digestive tract: Secondary | ICD-10-CM | POA: Diagnosis not present

## 2014-12-05 DIAGNOSIS — K929 Disease of digestive system, unspecified: Secondary | ICD-10-CM | POA: Diagnosis not present

## 2014-12-05 DIAGNOSIS — K219 Gastro-esophageal reflux disease without esophagitis: Secondary | ICD-10-CM | POA: Diagnosis not present

## 2014-12-05 DIAGNOSIS — J449 Chronic obstructive pulmonary disease, unspecified: Secondary | ICD-10-CM | POA: Diagnosis not present

## 2014-12-05 HISTORY — PX: ESOPHAGOGASTRODUODENOSCOPY: SHX1529

## 2014-12-05 MED ORDER — OMEPRAZOLE 40 MG PO CPDR: 40.0000 mg | DELAYED_RELEASE_CAPSULE | Freq: Every day | ORAL | Status: DC

## 2014-12-05 MED ORDER — SODIUM CHLORIDE 0.9 % IV SOLN: 500.0000 mL | INTRAVENOUS | Status: DC

## 2014-12-05 NOTE — Progress Notes (Signed)
No problems noted in the recovery room. maw 

## 2014-12-05 NOTE — Op Note (Signed)
[place]  [address] [place], [address]   ENTEROSCOPY PROCEDURE REPORT     EXAM DATE: 12/05/2014  PATIENT NAME:      Mehmet, Scally           MR #:      [id number]  BIRTHDATE:       1941-03-25      VISIT #:     [id number] ATTENDING:     Ladene Artist, MD, [facility]     STATUS:     outpatient  ASSISTANT: REFERRING MD: Kerin Perna, M.D. ASA CLASS:        Class III  INDICATIONS:  The patient is a 74 yr old male here for an enteroscopy procedure due to abnormal CT of jejunum. PROCEDURE PERFORMED:     Small bowel enteroscopy with biopsy MEDICATIONS:     Monitored anesthesia care and Propofol 300 mg IV  CONSENT: The patient understands the risks and benefits of the procedure and understands that these risks include, but are not limited to: sedation, allergic reaction, infection, perforation and/or bleeding. Alternative means of evaluation and treatment include, among others: physical exam, x-rays, and/or surgical intervention. The patient elects to proceed with this endoscopic procedure.  DESCRIPTION OF PROCEDURE: During intra-op preparation period all mechanical & medical equipment was checked for proper function. Hand hygiene and appropriate measures for infection prevention was taken. After the risks, benefits and alternatives of the procedure were thoroughly explained, Informed consent was verified, confirmed and timeout was successfully executed by the treatment team. The [id number] endoscope was introduced through the mouth and advanced to the proximal jejunum jejunum. The prep was The overall prep quality was excellent.. The instrument was then slowly withdrawn while examining the mucosa circumferentially. The scope was then completely withdrawn from the patient and the procedure terminated. The pulse, BP, and O2 saturation were monitored and documented by the physician and the nursing staff throughout the entire procedure.  The patient was  cared for as planned according to standard protocol, then discharged to recovery in stable condition and with appropriate post procedure care. Estimated blood loss is zero unless otherwise noted in this procedure report.  ESOPHAGUS: There was LA Class C esophagitis (Mucosal breaks continuous between > 2 mucosal folds, but involving less than 75% of the esophageal circumference) noted.   There was a short benign appearing stricture, with an inner diameter of 66mm, at the gastroesophageal junction.  The stricture was traversable with resistance. STOMACH: The mucosa and folds of the stomach appeared normal. DUODENUM: bulb to 4th portion appeared normal. JEJUNUM: The exam showed no abnormalities in the jejunum to 50 cm. Multiple random biopsies were performed.    ADVERSE EVENTS:      There were no immediate complications.  IMPRESSIONS:     1.  LA Class C esophagitis 2.  Short stricture at the gastroesophageal junction 3.  Small hiatal hernia 4.  The exam showed no abnormalities in the jejunum; multiple random biopsies performed  RECOMMENDATIONS:     1.  Anti-reflux regimen 2.  Await pathology results 3.  Omeprazole 40 mg po qam, 1 year of refills 4.  Office appt in 6 weeks     Ladene Artist, MD, [facility] eSigned:  Ladene Artist, MD, [facility] 12/05/2014 8:57 AM      PATIENT NAME:  Shaquille, Janes MR#: [id number]

## 2014-12-05 NOTE — Progress Notes (Signed)
Patient awakening,vss,report to rn 

## 2014-12-05 NOTE — Patient Instructions (Signed)
YOU HAD AN ENDOSCOPIC PROCEDURE TODAY AT [facility]:   Refer to the procedure report that was given to you for any specific questions about what was found during the examination.  If the procedure report does not answer your questions, please call your gastroenterologist to clarify.  If you requested that your care partner not be given the details of your procedure findings, then the procedure report has been included in a sealed envelope for you to review at your convenience later.  YOU SHOULD EXPECT: Some feelings of bloating in the abdomen. Passage of more gas than usual.  Walking can help get rid of the air that was put into your GI tract during the procedure and reduce the bloating. If you had a lower endoscopy (such as a colonoscopy or flexible sigmoidoscopy) you may notice spotting of blood in your stool or on the toilet paper. If you underwent a bowel prep for your procedure, you may not have a normal bowel movement for a few days.  Please Note:  You might notice some irritation and congestion in your nose or some drainage.  This is from the oxygen used during your procedure.  There is no need for concern and it should clear up in a day or so.  SYMPTOMS TO REPORT IMMEDIATELY:    Following upper endoscopy (EGD)  Vomiting of blood or coffee ground material  New chest pain or pain under the shoulder blades  Painful or persistently difficult swallowing  New shortness of breath  Fever of 100F or higher  Black, tarry-looking stools  For urgent or emergent issues, a gastroenterologist can be reached at any hour by calling [phone].   DIET: Your first meal following the procedure should be a small meal and then it is ok to progress to your normal diet. Heavy or fried foods are harder to digest and may make you feel nauseous or bloated.  Likewise, meals heavy in dairy and vegetables can increase bloating.  Drink plenty of fluids but you should avoid alcoholic beverages  for 24 hours.  ACTIVITY:  You should plan to take it easy for the rest of today and you should NOT DRIVE or use heavy machinery until tomorrow (because of the sedation medicines used during the test).    FOLLOW UP: Our staff will call the number listed on your records the next business day following your procedure to check on you and address any questions or concerns that you may have regarding the information given to you following your procedure. If we do not reach you, we will leave a message.  However, if you are feeling well and you are not experiencing any problems, there is no need to return our call.  We will assume that you have returned to your regular daily activities without incident.  If any biopsies were taken you will be contacted by phone or by letter within the next 1-3 weeks.  Please call us at [phone] if you have not heard about the biopsies in 3 weeks.    SIGNATURES/CONFIDENTIALITY: You and/or your care partner have signed paperwork which will be entered into your electronic medical record.  These signatures attest to the fact that that the information above on your After Visit Summary has been reviewed and is understood.  Full responsibility of the confidentiality of this discharge information lies with you and/or your care-partner.    Handouts were given to your care partner on GERD and a hiatal hernia. You may resume your  current medications today.  New rx was sent to [facility] in [place], [place] for OMEPRAZOLE 40 mg take every am on an empty stomach. Office vise with Dr. Fuller Plan in 6 weeks.  CMA on the 3rd floor will call you with appointment. Await biopsy results. Please call if any questions or concerns.

## 2014-12-05 NOTE — Progress Notes (Signed)
Pt coughing up phlegm on arrival to recovery.  He vomited x1 within minutes in recovery.  HOB raised, pt was cleaned up.  No other complaints noted at this time.  Barkley Boards, CRNA at the pt's side when he vomited and Dr. Fuller Plan was notified of this.  Dr. Fuller Plan explained the the pt and his family that air is inflated into his stomach and can sometimes cause vomiting.  maw

## 2014-12-05 NOTE — Progress Notes (Signed)
Called to room to assist during endoscopic procedure.  Patient ID and intended procedure confirmed with present staff. Received instructions for my participation in the procedure from the performing physician.  

## 2014-12-06 ENCOUNTER — Telehealth: Payer: Self-pay | Admitting: *Deleted

## 2014-12-06 NOTE — Telephone Encounter (Signed)
  Follow up Call-  Call back number 12/05/2014 11/05/2014  Post procedure Call Back phone  # [phone]  Permission to leave phone message Yes Yes     Patient questions:  Do you have a fever, pain , or abdominal swelling? No. Pain Score  0 *  Have you tolerated food without any problems? Yes.    Have you been able to return to your normal activities? Yes.    Do you have any questions about your discharge instructions: Diet   No. Medications  No. Follow up visit  No.  Do you have questions or concerns about your Care? No.  Actions: * If pain score is 4 or above: No action needed, pain <4.

## 2014-12-12 DIAGNOSIS — N359 Urethral stricture, unspecified: Secondary | ICD-10-CM | POA: Diagnosis not present

## 2014-12-12 DIAGNOSIS — C672 Malignant neoplasm of lateral wall of bladder: Secondary | ICD-10-CM | POA: Diagnosis not present

## 2014-12-15 DIAGNOSIS — Z8551 Personal history of malignant neoplasm of bladder: Secondary | ICD-10-CM

## 2014-12-15 HISTORY — DX: Personal history of malignant neoplasm of bladder: Z85.51

## 2014-12-16 ENCOUNTER — Other Ambulatory Visit: Payer: Self-pay | Admitting: Urology

## 2014-12-19 ENCOUNTER — Encounter: Payer: Self-pay | Admitting: Gastroenterology

## 2014-12-26 NOTE — Op Note (Signed)
[place]  [address]. [place], [address]   COLONOSCOPY PROCEDURE REPORT  PATIENT: [name], [name]  MR#: [id number] BIRTHDATE: Nov 06, 1940 , 62  yrs. old GENDER: male ENDOSCOPIST: Ladene Artist, MD, [facility] REFERRED ZH:[id number] Gerarda Fraction, M.D. PROCEDURE DATE:  11/05/2014 PROCEDURE:   Colonoscopy, surveillance , Colonoscopy with biopsy, and Colonoscopy with snare polypectomy First Screening Colonoscopy - Avg.  risk and is 50 yrs.  old or older - No.  Prior Negative Screening - Now for repeat screening. N/A  History of Adenoma - Now for follow-up colonoscopy & has been > or = to 3 yrs.  Yes hx of adenoma.  Has been 3 or more years since last colonoscopy. ASA CLASS:   Class III INDICATIONS:Surveillance due to prior colonic neoplasia and PH Colon Adenoma. MEDICATIONS: Monitored anesthesia care and Propofol 230 mg IV DESCRIPTION OF PROCEDURE:   After the risks benefits and alternatives of the procedure were thoroughly explained, informed consent was obtained.  The digital rectal exam revealed no abnormalities of the rectum.   The LB [id number]  endoscope was introduced through the anus and advanced to the cecum, which was identified by both the appendix and ileocecal valve. No adverse events experienced.   The quality of the prep was adequate (MoviPrep was used)  The instrument was then slowly withdrawn as the colon was fully examined.   COLON FINDINGS: Four sessile polyps measuring 4-6 mm in size were found in the ascending colon and at the cecum.  Polypectomies were performed with a cold snare on the 3 larger polyps and with cold forceps for the 4 mm polyp.  The resection was complete, the polyp tissue was completely retrieved and sent to histology.  Six sessile polyps measuring 6-10 mm in size were found in the transverse colon.  Polypectomies were performed using snare cautery for the 10 mm polyp and with a cold snare for the other 5.  The resection  was complete, the polyp tissue was completely retrieved and sent to histology.   Two sessile polyps measuring 7 mm in size were found in the sigmoid colon and descending colon.  Polypectomies were performed with a cold snare.  The resection was complete, the polyp tissue was completely retrieved and sent to histology.   The examination was otherwise normal.  Retroflexed views revealed internal Grade I hemorrhoids. The time to cecum = 0.9 Withdrawal time = 16.2   The scope was withdrawn and the procedure completed.  COMPLICATIONS: There were no immediate complications.    ENDOSCOPIC IMPRESSION: 1.   Four sessile polyps in the ascending and cecum; polypectomies performed with cold snare and with cold forceps 2.   Six sessile polyps in the transverse colon; polypectomies performed using snare cautery and with cold snare 3.   Two sessile polyps in the sigmoid colon and descending colon; polypectomies performed with a cold snare 4.   Grade l internal hemorrhoids  RECOMMENDATIONS: 1.  Hold Aspirin and all other NSAIDS for 2 weeks. 2.  Await pathology results 3.  Repeat Colonoscopy in 2 years pending path review  eSigned:  Ladene Artist, MD, [facility] 11/05/2014 2:36 PM     PATIENT NAME:  [name], [name] MR#: [id number]

## 2014-12-27 ENCOUNTER — Encounter (HOSPITAL_BASED_OUTPATIENT_CLINIC_OR_DEPARTMENT_OTHER): Payer: Self-pay | Admitting: *Deleted

## 2014-12-27 NOTE — Progress Notes (Signed)
NPO AFTER MN.  ARRIVE AT 0830. NEEDS HG. WILL TAKE AM MEDS. W/ SIPS OF WATER DOS.

## 2014-12-31 ENCOUNTER — Encounter (HOSPITAL_BASED_OUTPATIENT_CLINIC_OR_DEPARTMENT_OTHER)
Admission: RE | Disposition: A | Discharge status: Home / Self Care | Payer: Self-pay | Source: Ambulatory Visit | Attending: Urology

## 2014-12-31 ENCOUNTER — Encounter (HOSPITAL_BASED_OUTPATIENT_CLINIC_OR_DEPARTMENT_OTHER): Payer: Self-pay | Admitting: Anesthesiology

## 2014-12-31 ENCOUNTER — Ambulatory Visit (HOSPITAL_BASED_OUTPATIENT_CLINIC_OR_DEPARTMENT_OTHER)
Admission: RE | Admit: 2014-12-31 | Discharge: 2014-12-31 | Disposition: A | Discharge status: Home / Self Care | Payer: Medicare Other | Source: Ambulatory Visit | Attending: Urology | Admitting: Urology

## 2014-12-31 ENCOUNTER — Ambulatory Visit (HOSPITAL_BASED_OUTPATIENT_CLINIC_OR_DEPARTMENT_OTHER): Payer: Medicare Other | Admitting: Anesthesiology

## 2014-12-31 DIAGNOSIS — C672 Malignant neoplasm of lateral wall of bladder: Secondary | ICD-10-CM | POA: Insufficient documentation

## 2014-12-31 DIAGNOSIS — Z87891 Personal history of nicotine dependence: Secondary | ICD-10-CM | POA: Insufficient documentation

## 2014-12-31 DIAGNOSIS — N323 Diverticulum of bladder: Secondary | ICD-10-CM | POA: Insufficient documentation

## 2014-12-31 DIAGNOSIS — K219 Gastro-esophageal reflux disease without esophagitis: Secondary | ICD-10-CM | POA: Insufficient documentation

## 2014-12-31 DIAGNOSIS — N3289 Other specified disorders of bladder: Secondary | ICD-10-CM | POA: Diagnosis not present

## 2014-12-31 DIAGNOSIS — M109 Gout, unspecified: Secondary | ICD-10-CM | POA: Insufficient documentation

## 2014-12-31 DIAGNOSIS — M1990 Unspecified osteoarthritis, unspecified site: Secondary | ICD-10-CM | POA: Diagnosis not present

## 2014-12-31 DIAGNOSIS — Z79891 Long term (current) use of opiate analgesic: Secondary | ICD-10-CM | POA: Insufficient documentation

## 2014-12-31 DIAGNOSIS — Z79899 Other long term (current) drug therapy: Secondary | ICD-10-CM | POA: Insufficient documentation

## 2014-12-31 DIAGNOSIS — C679 Malignant neoplasm of bladder, unspecified: Secondary | ICD-10-CM | POA: Diagnosis not present

## 2014-12-31 DIAGNOSIS — J449 Chronic obstructive pulmonary disease, unspecified: Secondary | ICD-10-CM | POA: Diagnosis not present

## 2014-12-31 DIAGNOSIS — N359 Urethral stricture, unspecified: Secondary | ICD-10-CM | POA: Insufficient documentation

## 2014-12-31 DIAGNOSIS — D303 Benign neoplasm of bladder: Secondary | ICD-10-CM | POA: Diagnosis present

## 2014-12-31 HISTORY — DX: Personal history of other diseases of the digestive system: Z87.19

## 2014-12-31 HISTORY — DX: Personal history of colonic polyps: Z86.010

## 2014-12-31 HISTORY — DX: Nocturia: R35.1

## 2014-12-31 HISTORY — PX: TRANSURETHRAL RESECTION OF BLADDER TUMOR: SHX2575

## 2014-12-31 HISTORY — DX: Personal history of adenomatous and serrated colon polyps: Z86.0101

## 2014-12-31 LAB — POCT HEMOGLOBIN-HEMACUE: Hemoglobin: 17 g/dL (ref 13.0–17.0)

## 2014-12-31 SURGERY — TURBT (TRANSURETHRAL RESECTION OF BLADDER TUMOR)
Anesthesia: General | Site: Bladder

## 2014-12-31 MED ORDER — MITOMYCIN CHEMO FOR BLADDER INSTILLATION 40 MG
40.0000 mg | Freq: Once | INTRAVENOUS | Status: AC
  Administered 2014-12-31: 40 mg via INTRAVESICAL
  Filled 2014-12-31: qty 40

## 2014-12-31 MED ORDER — MEPERIDINE HCL 25 MG/ML IJ SOLN
6.2500 mg | INTRAMUSCULAR | Status: DC | PRN
  Filled 2014-12-31: qty 1

## 2014-12-31 MED ORDER — FENTANYL CITRATE (PF) 100 MCG/2ML IJ SOLN
INTRAMUSCULAR | Status: DC | PRN
  Administered 2014-12-31: 50 ug via INTRAVENOUS
  Administered 2014-12-31: 100 ug via INTRAVENOUS

## 2014-12-31 MED ORDER — LACTATED RINGERS IV SOLN
INTRAVENOUS | Status: DC
  Administered 2014-12-31 (×2): via INTRAVENOUS
  Filled 2014-12-31: qty 1000

## 2014-12-31 MED ORDER — MIDAZOLAM HCL 5 MG/5ML IJ SOLN
INTRAMUSCULAR | Status: DC | PRN
  Administered 2014-12-31: 2 mg via INTRAVENOUS

## 2014-12-31 MED ORDER — OXYCODONE HCL 5 MG PO TABS
5.0000 mg | ORAL_TABLET | Freq: Once | ORAL | Status: AC
  Administered 2014-12-31: 5 mg via ORAL
  Filled 2014-12-31: qty 1

## 2014-12-31 MED ORDER — MIDAZOLAM HCL 2 MG/2ML IJ SOLN
INTRAMUSCULAR | Status: AC
  Filled 2014-12-31: qty 2

## 2014-12-31 MED ORDER — SODIUM CHLORIDE 0.9 % IR SOLN
Status: DC | PRN
  Administered 2014-12-31 (×5): 3000 mL via INTRAVESICAL

## 2014-12-31 MED ORDER — PROPOFOL 10 MG/ML IV BOLUS
INTRAVENOUS | Status: DC | PRN
  Administered 2014-12-31: 160 mg via INTRAVENOUS
  Administered 2014-12-31: 30 mg via INTRAVENOUS

## 2014-12-31 MED ORDER — CEFAZOLIN SODIUM-DEXTROSE 2-3 GM-% IV SOLR
2.0000 g | INTRAVENOUS | Status: AC
  Administered 2014-12-31: 2 g via INTRAVENOUS
  Filled 2014-12-31: qty 50

## 2014-12-31 MED ORDER — PROMETHAZINE HCL 25 MG/ML IJ SOLN
6.2500 mg | INTRAMUSCULAR | Status: DC | PRN
  Filled 2014-12-31: qty 1

## 2014-12-31 MED ORDER — OXYCODONE HCL 5 MG PO TABS
ORAL_TABLET | ORAL | Status: AC
  Filled 2014-12-31: qty 1

## 2014-12-31 MED ORDER — PHENYLEPHRINE HCL 10 MG/ML IJ SOLN
INTRAMUSCULAR | Status: DC | PRN
  Administered 2014-12-31 (×2): 120 ug via INTRAVENOUS

## 2014-12-31 MED ORDER — DEXAMETHASONE SODIUM PHOSPHATE 4 MG/ML IJ SOLN
INTRAMUSCULAR | Status: DC | PRN
  Administered 2014-12-31: 10 mg via INTRAVENOUS

## 2014-12-31 MED ORDER — ACETAMINOPHEN 10 MG/ML IV SOLN
INTRAVENOUS | Status: DC | PRN
  Administered 2014-12-31: 1000 mg via INTRAVENOUS

## 2014-12-31 MED ORDER — OXYBUTYNIN CHLORIDE 5 MG PO TABS
ORAL_TABLET | ORAL | Status: AC
  Filled 2014-12-31: qty 1

## 2014-12-31 MED ORDER — PHENAZOPYRIDINE HCL 100 MG PO TABS
ORAL_TABLET | ORAL | Status: AC
  Filled 2014-12-31: qty 2

## 2014-12-31 MED ORDER — HYDROMORPHONE HCL 1 MG/ML IJ SOLN
INTRAMUSCULAR | Status: AC
  Filled 2014-12-31: qty 1

## 2014-12-31 MED ORDER — MITOMYCIN CHEMO FOR BLADDER INSTILLATION 40 MG: 40.0000 mg | Freq: Once | INTRAVENOUS | Status: DC

## 2014-12-31 MED ORDER — CEFAZOLIN SODIUM 1-5 GM-% IV SOLN
1.0000 g | INTRAVENOUS | Status: DC
  Filled 2014-12-31: qty 50

## 2014-12-31 MED ORDER — FENTANYL CITRATE (PF) 100 MCG/2ML IJ SOLN
INTRAMUSCULAR | Status: AC
  Filled 2014-12-31: qty 4

## 2014-12-31 MED ORDER — HYDROMORPHONE HCL 1 MG/ML IJ SOLN
0.2500 mg | INTRAMUSCULAR | Status: DC | PRN
  Administered 2014-12-31 (×2): 0.5 mg via INTRAVENOUS
  Filled 2014-12-31: qty 1

## 2014-12-31 MED ORDER — STERILE WATER FOR IRRIGATION IR SOLN
Status: DC | PRN
  Administered 2014-12-31: 500 mL
  Administered 2014-12-31 (×2): 3000 mL

## 2014-12-31 MED ORDER — CEFAZOLIN SODIUM-DEXTROSE 2-3 GM-% IV SOLR
INTRAVENOUS | Status: AC
  Filled 2014-12-31: qty 50

## 2014-12-31 MED ORDER — LIDOCAINE HCL (CARDIAC) 20 MG/ML IV SOLN
INTRAVENOUS | Status: DC | PRN
  Administered 2014-12-31: 70 mg via INTRAVENOUS

## 2014-12-31 MED ORDER — OXYBUTYNIN CHLORIDE 5 MG PO TABS
5.0000 mg | ORAL_TABLET | Freq: Three times a day (TID) | ORAL | Status: DC
  Administered 2014-12-31: 5 mg via ORAL
  Filled 2014-12-31: qty 1

## 2014-12-31 MED ORDER — PHENAZOPYRIDINE HCL 200 MG PO TABS
200.0000 mg | ORAL_TABLET | Freq: Once | ORAL | Status: DC
  Filled 2014-12-31: qty 1

## 2014-12-31 SURGICAL SUPPLY — 25 items
BAG DRAIN URO-CYSTO SKYTR STRL (DRAIN) ×2 IMPLANT
BAG DRN ANRFLXCHMBR STRAP LEK (BAG)
BAG DRN UROCATH (DRAIN) ×1
BAG URINE DRAINAGE (UROLOGICAL SUPPLIES) ×1 IMPLANT
BAG URINE LEG 19OZ MD ST LTX (BAG) IMPLANT
CANISTER SUCT LVC 12 LTR MEDI- (MISCELLANEOUS) ×3 IMPLANT
CATH FOLEY 2WAY SLVR  5CC 18FR (CATHETERS) ×1
CATH FOLEY 2WAY SLVR  5CC 20FR (CATHETERS)
CATH FOLEY 2WAY SLVR  5CC 22FR (CATHETERS)
CATH FOLEY 2WAY SLVR 5CC 18FR (CATHETERS) IMPLANT
CATH FOLEY 2WAY SLVR 5CC 20FR (CATHETERS) IMPLANT
CATH FOLEY 2WAY SLVR 5CC 22FR (CATHETERS) IMPLANT
CLOTH BEACON ORANGE TIMEOUT ST (SAFETY) ×2 IMPLANT
ELECT REM PT RETURN 9FT ADLT (ELECTROSURGICAL) ×2
ELECTRODE REM PT RTRN 9FT ADLT (ELECTROSURGICAL) ×1 IMPLANT
EVACUATOR MICROVAS BLADDER (UROLOGICAL SUPPLIES) ×2 IMPLANT
GLOVE BIO SURGEON STRL SZ7 (GLOVE) ×2 IMPLANT
HOLDER FOLEY CATH W/STRAP (MISCELLANEOUS) ×1 IMPLANT
IV NS IRRIG 3000ML ARTHROMATIC (IV SOLUTION) ×5 IMPLANT
PACK CYSTO (CUSTOM PROCEDURE TRAY) ×2 IMPLANT
PLUG CATH AND CAP STER (CATHETERS) ×1 IMPLANT
SET ASPIRATION TUBING (TUBING) ×1 IMPLANT
SYRINGE IRR TOOMEY STRL 70CC (SYRINGE) ×1 IMPLANT
WATER STERILE IRR 3000ML UROMA (IV SOLUTION) ×2 IMPLANT
WATER STERILE IRR 500ML POUR (IV SOLUTION) ×1 IMPLANT

## 2014-12-31 NOTE — Discharge Instructions (Signed)
Transurethral Procedure  Medications: Resume all your other meds from home  Activity: 1. No heavy lifting > 10 pounds for 2 weeks. 2. No sexual activity for 2 weeks. 3. No strenuous activity for 2 weeks. 4. No driving while on narcotic pain medications. 5. Drink plenty of water. 6. Continue to walk at home - you can still get blood clots when you are at home so keep     active but don't over do it. 7. Your urine may have some blood in it - make sure you drink plenty of water. Call or           come to the ER immediately if you catheter stops draining or you are unable to urinate.  Bathing: You can shower. You can take a bath unless you have a foley catheter in place.  Signs / Symptoms to call: 1. Call if you have a fever greater than 101.5  2. Uncontrolled nausea / vomiting, uncontrolled pain / dizziness, unable to urinate, leg         swelling / leg pain, or any other concerns.   You can reach [place] at [phone]   Foley Catheter Care A Foley catheter is a soft, flexible tube that is placed into the bladder to drain urine. A Foley catheter may be inserted if:  You leak urine or are not able to control when you urinate (urinary incontinence).  You are not able to urinate when you need to (urinary retention).  You had prostate surgery or surgery on the genitals.  You have certain medical conditions, such as multiple sclerosis, dementia, or a spinal cord injury. If you are going home with a Foley catheter in place, follow the instructions below. TAKING CARE OF THE CATHETER 1. Wash your hands with soap and water. 2. Using mild soap and warm water on a clean washcloth:  Clean the area on your body closest to the catheter insertion site using a circular motion, moving away from the catheter. Never wipe toward the catheter because this could sweep bacteria up into the urethra and cause infection.  Remove all traces of soap. Pat the area  dry with a clean towel. For males, reposition the foreskin. 3. Attach the catheter to your leg so there is no tension on the catheter. Use adhesive tape or a leg strap. If you are using adhesive tape, remove any sticky residue left behind by the previous tape you used. 4. Keep the drainage bag below the level of the bladder, but keep it off the floor. 5. Check throughout the day to be sure the catheter is working and urine is draining freely. Make sure the tubing does not become kinked. 6. Do not pull on the catheter or try to remove it. Pulling could damage internal tissues. TAKING CARE OF THE DRAINAGE BAGS You will be given two drainage bags to take home. One is a large overnight drainage bag, and the other is a smaller leg bag that fits underneath clothing. You may wear the overnight bag at any time, but you should never wear the smaller leg bag at night. Follow the instructions below for how to empty, change, and clean your drainage bags. Emptying the Drainage Bag You must empty your drainage bag when it is  - full or at least 2-3 times a day. 1. Wash your hands with soap and water. 2. Keep the drainage bag below your hips, below the level of your bladder. This stops urine from going back into the tubing  and into your bladder. 3. Hold the dirty bag over the toilet or a clean container. 4. Open the pour spout at the bottom of the bag and empty the urine into the toilet or container. Do not let the pour spout touch the toilet, container, or any other surface. Doing so can place bacteria on the bag, which can cause an infection. 5. Clean the pour spout with a gauze pad or cotton ball that has rubbing alcohol on it. 6. Close the pour spout. 7. Attach the bag to your leg with adhesive tape or a leg strap. 8. Wash your hands well. Changing the Drainage Bag Change your drainage bag once a month or sooner if it starts to smell bad or look dirty. Below are steps to follow when changing the drainage  bag. 1. Wash your hands with soap and water. 2. Pinch off the rubber catheter so that urine does not spill out. 3. Disconnect the catheter tube from the drainage tube at the connection valve. Do not let the tubes touch any surface. 4. Clean the end of the catheter tube with an alcohol wipe. Use a different alcohol wipe to clean the end of the drainage tube. 5. Connect the catheter tube to the drainage tube of the clean drainage bag. 6. Attach the new bag to the leg with adhesive tape or a leg strap. Avoid attaching the new bag too tightly. 7. Wash your hands well. Cleaning the Drainage Bag 1. Wash your hands with soap and water. 2. Wash the bag in warm, soapy water. 3. Rinse the bag thoroughly with warm water. 4. Fill the bag with a solution of white vinegar and water (1 cup vinegar to 1 qt warm water [.2 L vinegar to 1 L warm water]). Close the bag and soak it for 30 minutes in the solution. 5. Rinse the bag with warm water. 6. Hang the bag to dry with the pour spout open and hanging downward. 7. Store the clean bag (once it is dry) in a clean plastic bag. 8. Wash your hands well. PREVENTING INFECTION  Wash your hands before and after handling your catheter.  Take showers daily and wash the area where the catheter enters your body. Do not take baths. Replace wet leg straps with dry ones, if this applies.  Do not use powders, sprays, or lotions on the genital area. Only use creams, lotions, or ointments as directed by your caregiver.  For females, wipe from front to back after each bowel movement.  Drink enough fluids to keep your urine clear or pale yellow unless you have a fluid restriction.  Do not let the drainage bag or tubing touch or lie on the floor.  Wear cotton underwear to absorb moisture and to keep your skin drier. SEEK MEDICAL CARE IF:   Your urine is cloudy or smells unusually bad.  Your catheter becomes clogged.  You are not draining urine into the bag or your  bladder feels full.  Your catheter starts to leak. SEEK IMMEDIATE MEDICAL CARE IF:   You have pain, swelling, redness, or pus where the catheter enters the body.  You have pain in the abdomen, legs, lower back, or bladder.  You have a fever.  You see blood fill the catheter, or your urine is pink or red.  You have nausea, vomiting, or chills.  Your catheter gets pulled out. MAKE SURE YOU:   Understand these instructions.  Will watch your condition.  Will get help right away if you  are not doing well or get worse. Document Released: 08/02/2005 Document Revised: 12/17/2013 Document Reviewed: 07/24/2012 [facility] Patient Information 2015 [place], [place]. This information is not intended to replace advice given to you by your health care provider. Make sure you discuss any questions you have with your health care provider.     Post Anesthesia Home Care Instructions  Activity: Get plenty of rest for the remainder of the day. A responsible adult should stay with you for 24 hours following the procedure.  For the next 24 hours, DO NOT: -Drive a car -Paediatric nurse -Drink alcoholic beverages -Take any medication unless instructed by your physician -Make any legal decisions or sign important papers.  Meals: Start with liquid foods such as gelatin or soup. Progress to regular foods as tolerated. Avoid greasy, spicy, heavy foods. If nausea and/or vomiting occur, drink only clear liquids until the nausea and/or vomiting subsides. Call your physician if vomiting continues.  Special Instructions/Symptoms: Your throat may feel dry or sore from the anesthesia or the breathing tube placed in your throat during surgery. If this causes discomfort, gargle with warm salt water. The discomfort should disappear within 24 hours.  If you had a scopolamine patch placed behind your ear for the management of post- operative nausea and/or vomiting:  1. The medication in the patch is effective  for 72 hours, after which it should be removed.  Wrap patch in a tissue and discard in the trash. Wash hands thoroughly with soap and water. 2. You may remove the patch earlier than 72 hours if you experience unpleasant side effects which may include dry mouth, dizziness or visual disturbances. 3. Avoid touching the patch. Wash your hands with soap and water after contact with the patch.

## 2014-12-31 NOTE — Anesthesia Preprocedure Evaluation (Addendum)
Anesthesia Evaluation  Patient identified by MRN, date of birth, ID band Patient awake    Reviewed: Allergy & Precautions, NPO status , Patient's Chart, lab work & pertinent test results  Airway Mallampati: II  TM Distance: >3 FB Neck ROM: Full    Dental no notable dental hx. (+) Upper Dentures, Dental Advisory Given, Caps,    Pulmonary COPDformer smoker,  breath sounds clear to auscultation  Pulmonary exam normal       Cardiovascular negative cardio ROS Normal cardiovascular examRhythm:Regular Rate:Normal     Neuro/Psych negative neurological ROS  negative psych ROS   GI/Hepatic Neg liver ROS, hiatal hernia, GERD-  Medicated and Controlled,  Endo/Other  negative endocrine ROS  Renal/GU negative Renal ROS     Musculoskeletal negative musculoskeletal ROS (+)   Abdominal   Peds  Hematology negative hematology ROS (+)   Anesthesia Other Findings 7 teeth remain on bottom  ? Ectodermal Dysplasia  Reproductive/Obstetrics                           Anesthesia Physical Anesthesia Plan  ASA: II  Anesthesia Plan: General   Post-op Pain Management:    Induction: Intravenous  Airway Management Planned: LMA  Additional Equipment: None  Intra-op Plan:   Post-operative Plan: Extubation in OR  Informed Consent: I have reviewed the patients History and Physical, chart, labs and discussed the procedure including the risks, benefits and alternatives for the proposed anesthesia with the patient or authorized representative who has indicated his/her understanding and acceptance.   Dental advisory given  Plan Discussed with: CRNA  Anesthesia Plan Comments:         Anesthesia Quick Evaluation

## 2014-12-31 NOTE — Anesthesia Procedure Notes (Signed)
Procedure Name: LMA Insertion Date/Time: 12/31/2014 10:18 AM Performed by: Wanita Chamberlain Pre-anesthesia Checklist: Patient identified, Timeout performed, Emergency Drugs available, Suction available and Patient being monitored Patient Re-evaluated:Patient Re-evaluated prior to inductionOxygen Delivery Method: Circle system utilized Preoxygenation: Pre-oxygenation with 100% oxygen Intubation Type: IV induction Ventilation: Mask ventilation without difficulty LMA: LMA inserted LMA Size: 5.0 Number of attempts: 1 Placement Confirmation: positive ETCO2 Tube secured with: Tape Dental Injury: Teeth and Oropharynx as per pre-operative assessment

## 2014-12-31 NOTE — Op Note (Signed)
[name] is a 74 y.o.   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  Postop diagnosis: Same  Procedure done: Cystoscopy TUR bladder tumors  Surgeon: Charlene Brooke. Naiah Donahoe  Anesthesia: Gen.  Indication: Patient is a 74 years old male who was referred to our office for a filling defect in the left lateral wall of the bladder found on CT scan done for abdominal pain. He denies any history of hematuria. He quit smoking 26 years ago and had smoking pack a day for 25 years. Cystoscopy in the office showed 2 separate tumors on the left lateral wall of the bladder above the ureteral orifice. The larger tumor measures about 3 cm and the smaller tumor measured 2 cm. He is scheduled today for cystoscopy and TUR BT.  Procedure: Patient was identified by his wrist band and proper timeout was taken.  Under general anesthesia he was prepped and draped and placed in the dorsolithotomy position. He has a meatal stenosis and the need panendoscope could not be passed in the bladder. I dilated the meatus with #22 [place] with Von Buren sound. The panendoscope was then inserted in the bladder. The anterior urethra is normal. He has trilobar prostatic hypertrophy. The bladder is heavily trabeculated. There are 2 tumors on the left lateral wall of the bladder above the ureteral orifice. The tumor was adjacent to each oother. The larger tumor measures 3 cm and the smaller one measures 2 cm. There is a large diverticulum on the left lateral wall of the bladder above the larger tumor. Resection of the larger bladder tumor was done with the loop. Specimen was sent to pathology. Resection of the smaller bladder tumor was also done.  An other diverticulum was then noted just above the smaller tumor. There is no evidence of tumor inside of the diverticula. The base of the bladder tumors was then resected and specimen was sent to pathology for staging purposes. Hemostasis was secured  with electrocautery. A 2 cm resection margin was then fulgurated with electrocautery. There was no evidence of bleeding at the end of the procedure. The bladder was irrigated with normal saline and there was no evidence of remaining tumor in the bladder. The resectoscope was then removed. A #18 French Foley catheter was then inserted in the bladder. Then 40 mg of mitomycin-C in 40 mL of water were instilled in the bladder. Mitomycin will be left in the bladder for about 45 minutes.  Patient tolerated the procedure well and left the OR in satisfactory condition to postanesthesia care unit.  EBL: Minimal  CC: Lucio Edward, MD        Traci Sermon, MD

## 2014-12-31 NOTE — Transfer of Care (Signed)
Immediate Anesthesia Transfer of Care Note  Patient: [name]  Procedure(s) Performed: Procedure(s): TRANSURETHRAL RESECTION OF BLADDER TUMOR (TURBT) (N/A)  Patient Location: PACU  Anesthesia Type:General  Level of Consciousness: awake, alert , oriented and patient cooperative  Airway & Oxygen Therapy: Patient Spontanous Breathing and Patient connected to nasal cannula oxygen  Post-op Assessment: Report given to RN and Post -op Vital signs reviewed and stable  Post vital signs: Reviewed and stable  Last Vitals:  Filed Vitals:   12/31/14 0834  BP: 160/91  Pulse: 98  Temp: 36.6 C  Resp: 20    Complications: No apparent anesthesia complications

## 2014-12-31 NOTE — H&P (Signed)
History of Present Illness [name] returns for follow-up. CT scan in December 2015 and on 11/26/14 showed filling defect on the left posterior wall of the bladder with thickened bladder wall, 2 small right renal cysts. There is also a left sided bladder diverticulum. He denies hematuria. He quit smoking 26 years ago and had smoked 1 pack a day for 25 years. Cystoscopy today shows at least 2 separate tumors on the left lateral wall of the bladder.   Past Medical History Problems  1. History of Gout (M10.9) 2. History of arthritis (Z87.39) 3. History of heartburn (S97.026)  Surgical History Problems  1. History of No Surgical Problems  Current Meds 1. Hydrocodone-Acetaminophen TABS;  Therapy: (Recorded:19Apr2016) to Recorded 2. Tamsulosin HCl - 0.4 MG Oral Capsule;  Therapy: (Recorded:19Apr2016) to Recorded  Allergies Medication  1. Cipro TABS  Family History Problems  1. Family history of Alcoholism : Father 2. Family history of Alcoholism : Mother 3. Family history of Cirrhosis : Father 4. Family history of Cirrhosis : Mother 110. Family history of Death In The Family Father : Father   CirrhosisAge of death was 72 6. Family history of Death In The Family Mother : Mother   CirrhosisAge of death was 37 7. Family history of Family Health Status Number Of Children   One son  Social History Problems  1. Alcohol Use   Two per day 2. Caffeine Use   Three per day 3. Former smoker [phone]) 4. Marital History - Currently Married 5. Number of children   1 son 6. Retired From Work 7. History of Tobacco Use   Smoked two ppd for 20 yrs  Review of Systems Genitourinary, constitutional, skin, eye, otolaryngeal, hematologic/lymphatic, cardiovascular, pulmonary, endocrine, musculoskeletal, gastrointestinal, neurological and psychiatric system(s) were reviewed and pertinent findings if present are noted and are otherwise negative.  Genitourinary: urinary frequency and  nocturia.  Gastrointestinal: heartburn and diarrhea.  Hematologic/Lymphatic: a tendency to easily bruise.  Musculoskeletal: joint pain.    Physical Exam Constitutional: Well nourished and well developed . No acute distress.  ENT:. The ears and nose are normal in appearance.  Neck: The appearance of the neck is normal and no neck mass is present.  Pulmonary: No respiratory distress and normal respiratory rhythm and effort.  Cardiovascular: Heart rate and rhythm are normal . No peripheral edema.  Abdomen: The abdomen is soft and nontender. No masses are palpated. No CVA tenderness. No hernias are palpable. No hepatosplenomegaly noted.  Rectal: Rectal exam demonstrates normal sphincter tone, no tenderness and no masses. The prostate has no nodularity and is not tender. The left seminal vesicle is nonpalpable. The right seminal vesicle is nonpalpable. The perineum is normal on inspection.  Genitourinary: Examination of the penis demonstrates no discharge, no masses, no lesions and a normal meatus. The scrotum is without lesions. The right epididymis is palpably normal and non-tender. The left epididymis is palpably normal and non-tender. The right testis is non-tender and without masses. The left testis is non-tender and without masses.  Lymphatics: The femoral and inguinal nodes are not enlarged or tender.  Skin: Normal skin turgor, no visible rash and no visible skin lesions.  Neuro/Psych:. Mood and affect are appropriate.    Results/Data Urine [Data Includes: Last 1 Day]   28Apr2016  COLOR YELLOW   APPEARANCE CLEAR   SPECIFIC GRAVITY 1.015   pH 6.5   GLUCOSE NEG mg/dL  BILIRUBIN NEG   KETONE NEG mg/dL  BLOOD TRACE   PROTEIN NEG mg/dL  UROBILINOGEN 0.2 mg/dL  NITRITE NEG   LEUKOCYTE ESTERASE NEG   SQUAMOUS EPITHELIAL/HPF FEW   WBC 0-2 WBC/hpf  RBC 3-6 RBC/hpf  BACTERIA NONE SEEN   CRYSTALS NONE SEEN   CASTS NONE SEEN    Procedure  Procedure: Cystoscopy   Indication: Bladder  Mass.  Informed Consent: Risks, benefits, and potential adverse events were discussed and informed consent was obtained from the patient . Specific risks including, but not limited to bleeding, infection, pain, allergic reaction etc. were explained.  Prep: The patient was prepped with betadine.  Anesthesia:. Local anesthesia was administered intraurethrally with 2% lidocaine jelly.  Antibiotic prophylaxis: Ciprofloxacin.  Procedure Note:  Urethral meatus:. A stricture was present at the urethral meatus .  Anterior urethra: No abnormalities.  Prostatic urethra:. The lateral and median prostatic lobes were enlarged.  Bladder: Visulization was clear. The ureteral orifices were in the normal anatomic position bilaterally. Multiple tumors were identified in the bladder. A sessile tumor was seen in the bladder measuring approximately 3 cm in size. This tumor was located on the left side, on the posterior aspect, at the base of the bladder. Another papillary tumor was seen in the bladder measuring approximately 2 cm in size. This tumor was located on the left side, at the base of the bladder. The patient tolerated the procedure well.  Complications: None.    Assessment Assessed  1. 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 Meatal stenosis (N35.9)  Plan Health Maintenance  1. UA With REFLEX; [Do Not Release]; Status:Complete;   Done: 28Apr2016 02:07PM  Cipro 500 mgm x 1. Needs TURBT. The procedure, risks, benefits were discussed with the patient. The risks include but are not limited to hemorrhage, infection, bladder injury. He understands that definitive treatment of the bladder tumor will depend on pathological findings.

## 2014-12-31 NOTE — Anesthesia Postprocedure Evaluation (Signed)
Anesthesia Post Note  Patient: [name]  Procedure(s) Performed: Procedure(s) (LRB): TRANSURETHRAL RESECTION OF BLADDER TUMOR (TURBT) (N/A)  Anesthesia type: General  Patient location: PACU  Post pain: Pain level controlled  Post assessment: Post-op Vital signs reviewed  Last Vitals: BP 166/97 mmHg  Pulse 90  Temp(Src) 36.6 C (Oral)  Resp 19  Ht 5\' 11"  (1.803 m)  Wt 205 lb (92.987 kg)  BMI 28.60 kg/m2  SpO2 95%  Post vital signs: Reviewed  Level of consciousness: sedated  Complications: No apparent anesthesia complications

## 2015-01-01 ENCOUNTER — Encounter (HOSPITAL_BASED_OUTPATIENT_CLINIC_OR_DEPARTMENT_OTHER): Payer: Self-pay | Admitting: Urology

## 2015-01-10 DIAGNOSIS — C672 Malignant neoplasm of lateral wall of bladder: Secondary | ICD-10-CM | POA: Diagnosis not present

## 2015-02-18 DIAGNOSIS — E663 Overweight: Secondary | ICD-10-CM | POA: Diagnosis not present

## 2015-02-18 DIAGNOSIS — Z6829 Body mass index (BMI) 29.0-29.9, adult: Secondary | ICD-10-CM | POA: Diagnosis not present

## 2015-02-18 DIAGNOSIS — D494 Neoplasm of unspecified behavior of bladder: Secondary | ICD-10-CM | POA: Diagnosis not present

## 2015-02-18 DIAGNOSIS — Z1389 Encounter for screening for other disorder: Secondary | ICD-10-CM | POA: Diagnosis not present

## 2015-02-18 DIAGNOSIS — C672 Malignant neoplasm of lateral wall of bladder: Secondary | ICD-10-CM | POA: Diagnosis not present

## 2015-02-18 DIAGNOSIS — M25562 Pain in left knee: Secondary | ICD-10-CM | POA: Diagnosis not present

## 2015-02-18 DIAGNOSIS — R300 Dysuria: Secondary | ICD-10-CM | POA: Diagnosis not present

## 2015-02-25 DIAGNOSIS — Z5111 Encounter for antineoplastic chemotherapy: Secondary | ICD-10-CM | POA: Diagnosis not present

## 2015-02-25 DIAGNOSIS — C672 Malignant neoplasm of lateral wall of bladder: Secondary | ICD-10-CM | POA: Diagnosis not present

## 2015-03-04 DIAGNOSIS — N390 Urinary tract infection, site not specified: Secondary | ICD-10-CM | POA: Diagnosis not present

## 2015-03-04 DIAGNOSIS — C679 Malignant neoplasm of bladder, unspecified: Secondary | ICD-10-CM | POA: Diagnosis not present

## 2015-03-11 DIAGNOSIS — C672 Malignant neoplasm of lateral wall of bladder: Secondary | ICD-10-CM | POA: Diagnosis not present

## 2015-03-11 DIAGNOSIS — Z5111 Encounter for antineoplastic chemotherapy: Secondary | ICD-10-CM | POA: Diagnosis not present

## 2015-03-18 DIAGNOSIS — C679 Malignant neoplasm of bladder, unspecified: Secondary | ICD-10-CM | POA: Diagnosis not present

## 2015-03-18 DIAGNOSIS — N390 Urinary tract infection, site not specified: Secondary | ICD-10-CM | POA: Diagnosis not present

## 2015-03-25 DIAGNOSIS — Z5111 Encounter for antineoplastic chemotherapy: Secondary | ICD-10-CM | POA: Diagnosis not present

## 2015-03-25 DIAGNOSIS — C672 Malignant neoplasm of lateral wall of bladder: Secondary | ICD-10-CM | POA: Diagnosis not present

## 2015-04-01 DIAGNOSIS — Z5111 Encounter for antineoplastic chemotherapy: Secondary | ICD-10-CM | POA: Diagnosis not present

## 2015-04-01 DIAGNOSIS — C670 Malignant neoplasm of trigone of bladder: Secondary | ICD-10-CM | POA: Diagnosis not present

## 2015-04-02 DIAGNOSIS — K219 Gastro-esophageal reflux disease without esophagitis: Secondary | ICD-10-CM | POA: Diagnosis not present

## 2015-04-02 DIAGNOSIS — I10 Essential (primary) hypertension: Secondary | ICD-10-CM | POA: Diagnosis not present

## 2015-04-02 DIAGNOSIS — E782 Mixed hyperlipidemia: Secondary | ICD-10-CM | POA: Diagnosis not present

## 2015-04-02 DIAGNOSIS — M1000 Idiopathic gout, unspecified site: Secondary | ICD-10-CM | POA: Diagnosis not present

## 2015-04-02 DIAGNOSIS — Z6829 Body mass index (BMI) 29.0-29.9, adult: Secondary | ICD-10-CM | POA: Diagnosis not present

## 2015-04-08 DIAGNOSIS — N390 Urinary tract infection, site not specified: Secondary | ICD-10-CM | POA: Diagnosis not present

## 2015-04-08 DIAGNOSIS — C679 Malignant neoplasm of bladder, unspecified: Secondary | ICD-10-CM | POA: Diagnosis not present

## 2015-04-22 DIAGNOSIS — C679 Malignant neoplasm of bladder, unspecified: Secondary | ICD-10-CM | POA: Diagnosis not present

## 2015-04-30 DIAGNOSIS — Z5111 Encounter for antineoplastic chemotherapy: Secondary | ICD-10-CM | POA: Diagnosis not present

## 2015-04-30 DIAGNOSIS — C679 Malignant neoplasm of bladder, unspecified: Secondary | ICD-10-CM | POA: Diagnosis not present

## 2015-05-15 DIAGNOSIS — C672 Malignant neoplasm of lateral wall of bladder: Secondary | ICD-10-CM | POA: Diagnosis not present

## 2015-05-22 DIAGNOSIS — H524 Presbyopia: Secondary | ICD-10-CM | POA: Diagnosis not present

## 2015-05-22 DIAGNOSIS — H52223 Regular astigmatism, bilateral: Secondary | ICD-10-CM | POA: Diagnosis not present

## 2015-05-22 DIAGNOSIS — H5203 Hypermetropia, bilateral: Secondary | ICD-10-CM | POA: Diagnosis not present

## 2015-05-22 DIAGNOSIS — H59092 Other disorders of the left eye following cataract surgery: Secondary | ICD-10-CM | POA: Diagnosis not present

## 2015-05-29 DIAGNOSIS — Z23 Encounter for immunization: Secondary | ICD-10-CM | POA: Diagnosis not present

## 2015-05-29 DIAGNOSIS — E663 Overweight: Secondary | ICD-10-CM | POA: Diagnosis not present

## 2015-05-29 DIAGNOSIS — Z6829 Body mass index (BMI) 29.0-29.9, adult: Secondary | ICD-10-CM | POA: Diagnosis not present

## 2015-05-29 DIAGNOSIS — M542 Cervicalgia: Secondary | ICD-10-CM | POA: Diagnosis not present

## 2015-05-29 DIAGNOSIS — L989 Disorder of the skin and subcutaneous tissue, unspecified: Secondary | ICD-10-CM | POA: Diagnosis not present

## 2015-05-29 DIAGNOSIS — Z1389 Encounter for screening for other disorder: Secondary | ICD-10-CM | POA: Diagnosis not present

## 2015-07-08 ENCOUNTER — Telehealth: Payer: Self-pay | Admitting: Gastroenterology

## 2015-07-08 NOTE — Telephone Encounter (Signed)
Patient reports reflux symptoms.  He had stopped the omeprazole due to cost.  Patient instructed to maintain an anti-reflux diet. Advised to avoid caffeine, mint, citrus foods/juices, tomatoes,  chocolate, NSAIDS/ASA products.  Instructed not to eat within 2 hours of exercise or bed, multiple small meals are better than 3 large meals.  Need to take PPI 30 minutes prior to 1st meal of the day and add a second one at bedtime for one week, then return to daily.  He reports that he only has diarrhea about 1 time a month.  He is advised to use imodium for occasional diarrhea.  He will call back for any additional questions or concerns or if his symptoms fail to improve.

## 2015-08-01 ENCOUNTER — Other Ambulatory Visit: Payer: Self-pay | Admitting: Urology

## 2015-08-01 DIAGNOSIS — R3121 Asymptomatic microscopic hematuria: Secondary | ICD-10-CM | POA: Diagnosis not present

## 2015-08-01 DIAGNOSIS — C672 Malignant neoplasm of lateral wall of bladder: Secondary | ICD-10-CM | POA: Diagnosis not present

## 2015-08-06 ENCOUNTER — Encounter (HOSPITAL_BASED_OUTPATIENT_CLINIC_OR_DEPARTMENT_OTHER): Payer: Self-pay | Admitting: *Deleted

## 2015-08-06 NOTE — Progress Notes (Signed)
To [facility] at 0600-Labs to be drawn 08/07/15-instructed Npo after Mn-will take omeprazole with small amt water in am.

## 2015-08-07 DIAGNOSIS — K219 Gastro-esophageal reflux disease without esophagitis: Secondary | ICD-10-CM | POA: Diagnosis not present

## 2015-08-07 DIAGNOSIS — K449 Diaphragmatic hernia without obstruction or gangrene: Secondary | ICD-10-CM | POA: Diagnosis not present

## 2015-08-07 DIAGNOSIS — M109 Gout, unspecified: Secondary | ICD-10-CM | POA: Diagnosis not present

## 2015-08-07 DIAGNOSIS — N329 Bladder disorder, unspecified: Secondary | ICD-10-CM | POA: Diagnosis present

## 2015-08-07 DIAGNOSIS — N359 Urethral stricture, unspecified: Secondary | ICD-10-CM | POA: Diagnosis not present

## 2015-08-07 DIAGNOSIS — Z79899 Other long term (current) drug therapy: Secondary | ICD-10-CM | POA: Diagnosis not present

## 2015-08-07 DIAGNOSIS — N323 Diverticulum of bladder: Secondary | ICD-10-CM | POA: Diagnosis not present

## 2015-08-07 DIAGNOSIS — Z87891 Personal history of nicotine dependence: Secondary | ICD-10-CM | POA: Diagnosis not present

## 2015-08-07 DIAGNOSIS — C678 Malignant neoplasm of overlapping sites of bladder: Secondary | ICD-10-CM | POA: Diagnosis not present

## 2015-08-07 DIAGNOSIS — M1990 Unspecified osteoarthritis, unspecified site: Secondary | ICD-10-CM | POA: Diagnosis not present

## 2015-08-07 DIAGNOSIS — R3121 Asymptomatic microscopic hematuria: Secondary | ICD-10-CM | POA: Diagnosis not present

## 2015-08-07 DIAGNOSIS — J449 Chronic obstructive pulmonary disease, unspecified: Secondary | ICD-10-CM | POA: Diagnosis not present

## 2015-08-07 LAB — BASIC METABOLIC PANEL
Anion gap: 6 (ref 5–15)
BUN: 9 mg/dL (ref 6–20)
CO2: 30 mmol/L (ref 22–32)
Calcium: 8.6 mg/dL — ABNORMAL LOW (ref 8.9–10.3)
Chloride: 106 mmol/L (ref 101–111)
Creatinine, Ser: 1.11 mg/dL (ref 0.61–1.24)
GFR calc Af Amer: >60 mL/min (ref >60)
GFR calc non Af Amer: >60 mL/min (ref >60)
Glucose, Bld: 121 mg/dL — ABNORMAL HIGH (ref 65–99)
Potassium: 4.6 mmol/L (ref 3.5–5.1)
Sodium: 142 mmol/L (ref 135–145)

## 2015-08-07 LAB — APTT: aPTT: 25 seconds (ref 24–37)

## 2015-08-07 LAB — CBC
HCT: 47.8 % (ref 39.0–52.0)
Hemoglobin: 16.6 g/dL (ref 13.0–17.0)
MCH: 35.8 pg — ABNORMAL HIGH (ref 26.0–34.0)
MCHC: 34.7 g/dL (ref 30.0–36.0)
MCV: 103 fL — ABNORMAL HIGH (ref 78.0–100.0)
Platelets: 193 10*3/uL (ref 150–400)
RBC: 4.64 MIL/uL (ref 4.22–5.81)
RDW: 12.9 % (ref 11.5–15.5)
WBC: 6.5 10*3/uL (ref 4.0–10.5)

## 2015-08-07 LAB — PROTIME-INR
INR: 0.99 (ref 0.00–1.49)
Prothrombin Time: 13.3 seconds (ref 11.6–15.2)

## 2015-08-11 NOTE — Anesthesia Preprocedure Evaluation (Signed)
Anesthesia Evaluation  Patient identified by MRN, date of birth, ID band Patient awake    Reviewed: Allergy & Precautions, NPO status , Patient's Chart, lab work & pertinent test results  Airway Mallampati: II  TM Distance: >3 FB Neck ROM: Full    Dental no notable dental hx. (+) Upper Dentures, Dental Advisory Given, Caps,    Pulmonary COPD, former smoker,    Pulmonary exam normal breath sounds clear to auscultation       Cardiovascular negative cardio ROS Normal cardiovascular exam Rhythm:Regular Rate:Normal     Neuro/Psych negative neurological ROS  negative psych ROS   GI/Hepatic Neg liver ROS, hiatal hernia, GERD  Medicated and Controlled,  Endo/Other  negative endocrine ROS  Renal/GU negative Renal ROS     Musculoskeletal negative musculoskeletal ROS (+)   Abdominal   Peds  Hematology negative hematology ROS (+)   Anesthesia Other Findings 7 teeth remain on bottom  ? Ectodermal Dysplasia  Reproductive/Obstetrics                             Anesthesia Physical Anesthesia Plan  ASA: III  Anesthesia Plan: General   Post-op Pain Management:    Induction: Intravenous  Airway Management Planned: LMA  Additional Equipment:   Intra-op Plan:   Post-operative Plan:   Informed Consent:   Plan Discussed with: Surgeon  Anesthesia Plan Comments:         Anesthesia Quick Evaluation

## 2015-08-12 ENCOUNTER — Ambulatory Visit (HOSPITAL_BASED_OUTPATIENT_CLINIC_OR_DEPARTMENT_OTHER)
Admission: RE | Admit: 2015-08-12 | Discharge: 2015-08-12 | Disposition: A | Discharge status: Home / Self Care | Payer: Medicare Other | Source: Ambulatory Visit | Attending: Urology | Admitting: Urology

## 2015-08-12 ENCOUNTER — Encounter (HOSPITAL_BASED_OUTPATIENT_CLINIC_OR_DEPARTMENT_OTHER): Payer: Self-pay | Admitting: *Deleted

## 2015-08-12 ENCOUNTER — Ambulatory Visit (HOSPITAL_BASED_OUTPATIENT_CLINIC_OR_DEPARTMENT_OTHER): Payer: Medicare Other | Admitting: Anesthesiology

## 2015-08-12 ENCOUNTER — Encounter (HOSPITAL_BASED_OUTPATIENT_CLINIC_OR_DEPARTMENT_OTHER)
Admission: RE | Disposition: A | Discharge status: Home / Self Care | Payer: Self-pay | Source: Ambulatory Visit | Attending: Urology

## 2015-08-12 DIAGNOSIS — R3121 Asymptomatic microscopic hematuria: Secondary | ICD-10-CM | POA: Insufficient documentation

## 2015-08-12 DIAGNOSIS — C678 Malignant neoplasm of overlapping sites of bladder: Secondary | ICD-10-CM | POA: Insufficient documentation

## 2015-08-12 DIAGNOSIS — N358 Other urethral stricture: Secondary | ICD-10-CM | POA: Diagnosis not present

## 2015-08-12 DIAGNOSIS — C679 Malignant neoplasm of bladder, unspecified: Secondary | ICD-10-CM | POA: Diagnosis not present

## 2015-08-12 DIAGNOSIS — N359 Urethral stricture, unspecified: Secondary | ICD-10-CM | POA: Insufficient documentation

## 2015-08-12 DIAGNOSIS — M1990 Unspecified osteoarthritis, unspecified site: Secondary | ICD-10-CM | POA: Diagnosis not present

## 2015-08-12 DIAGNOSIS — C672 Malignant neoplasm of lateral wall of bladder: Secondary | ICD-10-CM

## 2015-08-12 DIAGNOSIS — N323 Diverticulum of bladder: Secondary | ICD-10-CM | POA: Diagnosis not present

## 2015-08-12 DIAGNOSIS — M109 Gout, unspecified: Secondary | ICD-10-CM | POA: Insufficient documentation

## 2015-08-12 DIAGNOSIS — K449 Diaphragmatic hernia without obstruction or gangrene: Secondary | ICD-10-CM | POA: Insufficient documentation

## 2015-08-12 DIAGNOSIS — J449 Chronic obstructive pulmonary disease, unspecified: Secondary | ICD-10-CM | POA: Insufficient documentation

## 2015-08-12 DIAGNOSIS — Z87891 Personal history of nicotine dependence: Secondary | ICD-10-CM | POA: Insufficient documentation

## 2015-08-12 DIAGNOSIS — Z79899 Other long term (current) drug therapy: Secondary | ICD-10-CM | POA: Insufficient documentation

## 2015-08-12 DIAGNOSIS — K219 Gastro-esophageal reflux disease without esophagitis: Secondary | ICD-10-CM | POA: Insufficient documentation

## 2015-08-12 HISTORY — PX: CYSTOSCOPY WITH BIOPSY: SHX5122

## 2015-08-12 SURGERY — CYSTOSCOPY, WITH BIOPSY
Anesthesia: General

## 2015-08-12 MED ORDER — MIDAZOLAM HCL 2 MG/2ML IJ SOLN
INTRAMUSCULAR | Status: AC
  Filled 2015-08-12: qty 2

## 2015-08-12 MED ORDER — ARTIFICIAL TEARS OP OINT
TOPICAL_OINTMENT | OPHTHALMIC | Status: AC
  Filled 2015-08-12: qty 3.5

## 2015-08-12 MED ORDER — ONDANSETRON HCL 4 MG/2ML IJ SOLN
INTRAMUSCULAR | Status: DC | PRN
  Administered 2015-08-12: 4 mg via INTRAVENOUS

## 2015-08-12 MED ORDER — CEFAZOLIN SODIUM-DEXTROSE 2-3 GM-% IV SOLR
INTRAVENOUS | Status: AC
  Filled 2015-08-12: qty 50

## 2015-08-12 MED ORDER — DEXAMETHASONE SODIUM PHOSPHATE 4 MG/ML IJ SOLN
INTRAMUSCULAR | Status: DC | PRN
  Administered 2015-08-12: 10 mg via INTRAVENOUS

## 2015-08-12 MED ORDER — PROPOFOL 10 MG/ML IV BOLUS
INTRAVENOUS | Status: AC
  Filled 2015-08-12: qty 40

## 2015-08-12 MED ORDER — MIDAZOLAM HCL 5 MG/5ML IJ SOLN
INTRAMUSCULAR | Status: DC | PRN
  Administered 2015-08-12: 2 mg via INTRAVENOUS

## 2015-08-12 MED ORDER — LACTATED RINGERS IV SOLN
INTRAVENOUS | Status: DC
  Administered 2015-08-12 (×2): via INTRAVENOUS
  Filled 2015-08-12: qty 1000

## 2015-08-12 MED ORDER — PROPOFOL 10 MG/ML IV BOLUS: INTRAVENOUS | Status: DC | PRN

## 2015-08-12 MED ORDER — ONDANSETRON HCL 4 MG/2ML IJ SOLN
INTRAMUSCULAR | Status: AC
  Filled 2015-08-12: qty 2

## 2015-08-12 MED ORDER — OXYCODONE-ACETAMINOPHEN 5-325 MG PO TABS
ORAL_TABLET | ORAL | Status: AC
  Filled 2015-08-12: qty 1

## 2015-08-12 MED ORDER — KETOROLAC TROMETHAMINE 30 MG/ML IJ SOLN
INTRAMUSCULAR | Status: AC
  Filled 2015-08-12: qty 1

## 2015-08-12 MED ORDER — HYDROCODONE-IBUPROFEN 7.5-200 MG PO TABS: 1.0000 | ORAL_TABLET | Freq: Four times a day (QID) | ORAL | Status: DC | PRN

## 2015-08-12 MED ORDER — OXYCODONE-ACETAMINOPHEN 5-325 MG PO TABS
1.0000 | ORAL_TABLET | Freq: Once | ORAL | Status: AC
  Administered 2015-08-12: 1 via ORAL
  Filled 2015-08-12: qty 1

## 2015-08-12 MED ORDER — DEXAMETHASONE SODIUM PHOSPHATE 10 MG/ML IJ SOLN
INTRAMUSCULAR | Status: AC
  Filled 2015-08-12: qty 1

## 2015-08-12 MED ORDER — LIDOCAINE HCL (CARDIAC) 20 MG/ML IV SOLN
INTRAVENOUS | Status: DC | PRN
  Administered 2015-08-12: 100 mg via INTRAVENOUS

## 2015-08-12 MED ORDER — PHENYLEPHRINE 40 MCG/ML (10ML) SYRINGE FOR IV PUSH (FOR BLOOD PRESSURE SUPPORT)
PREFILLED_SYRINGE | INTRAVENOUS | Status: AC
  Filled 2015-08-12: qty 10

## 2015-08-12 MED ORDER — FENTANYL CITRATE (PF) 100 MCG/2ML IJ SOLN
25.0000 ug | INTRAMUSCULAR | Status: DC | PRN
  Filled 2015-08-12: qty 1

## 2015-08-12 MED ORDER — LIDOCAINE HCL (CARDIAC) 20 MG/ML IV SOLN
INTRAVENOUS | Status: AC
  Filled 2015-08-12: qty 5

## 2015-08-12 MED ORDER — FENTANYL CITRATE (PF) 100 MCG/2ML IJ SOLN
INTRAMUSCULAR | Status: DC | PRN
  Administered 2015-08-12 (×2): 25 ug via INTRAVENOUS

## 2015-08-12 MED ORDER — FENTANYL CITRATE (PF) 100 MCG/2ML IJ SOLN
INTRAMUSCULAR | Status: AC
  Filled 2015-08-12: qty 2

## 2015-08-12 MED ORDER — STERILE WATER FOR INJECTION IJ SOLN
INTRAMUSCULAR | Status: AC
  Filled 2015-08-12: qty 40

## 2015-08-12 MED ORDER — STERILE WATER FOR IRRIGATION IR SOLN
Status: DC | PRN
  Administered 2015-08-12: 3000 mL via INTRAVESICAL

## 2015-08-12 MED ORDER — PHENYLEPHRINE HCL 10 MG/ML IJ SOLN
INTRAMUSCULAR | Status: DC | PRN
  Administered 2015-08-12 (×2): 40 ug via INTRAVENOUS

## 2015-08-12 MED ORDER — LIDOCAINE HCL (CARDIAC) 20 MG/ML IV SOLN: INTRAVENOUS | Status: DC | PRN

## 2015-08-12 MED ORDER — CEFAZOLIN SODIUM-DEXTROSE 2-3 GM-% IV SOLR
2.0000 g | INTRAVENOUS | Status: AC
  Administered 2015-08-12: 2 g via INTRAVENOUS
  Filled 2015-08-12: qty 50

## 2015-08-12 MED ORDER — DEXAMETHASONE SODIUM PHOSPHATE 4 MG/ML IJ SOLN: INTRAMUSCULAR | Status: DC | PRN

## 2015-08-12 MED ORDER — SULFAMETHOXAZOLE-TRIMETHOPRIM 800-160 MG PO TABS: 1.0000 | ORAL_TABLET | Freq: Two times a day (BID) | ORAL | Status: DC

## 2015-08-12 MED ORDER — PROPOFOL 10 MG/ML IV BOLUS
INTRAVENOUS | Status: DC | PRN
  Administered 2015-08-12: 150 mg via INTRAVENOUS

## 2015-08-12 SURGICAL SUPPLY — 24 items
BAG DRN ANRFLXCHMBR STRAP LEK (BAG)
BAG URINE DRAINAGE (UROLOGICAL SUPPLIES) IMPLANT
BAG URINE LEG 19OZ MD ST LTX (BAG) IMPLANT
BAG URINE LEG 500ML (DRAIN) IMPLANT
BAG URO CATCHER STRL LF (MISCELLANEOUS) ×2 IMPLANT
CATH FOLEY 2WAY SLVR  5CC 22FR (CATHETERS)
CATH FOLEY 2WAY SLVR 30CC 20FR (CATHETERS) IMPLANT
CATH FOLEY 2WAY SLVR 5CC 22FR (CATHETERS) IMPLANT
CLOTH BEACON ORANGE TIMEOUT ST (SAFETY) ×2 IMPLANT
ELECT BIVAP BIPO 22/24 DONUT (ELECTROSURGICAL)
ELECT REM PT RETURN 9FT ADLT (ELECTROSURGICAL) ×4
ELECTRD BIVAP BIPO 22/24 DONUT (ELECTROSURGICAL) IMPLANT
ELECTRODE REM PT RTRN 9FT ADLT (ELECTROSURGICAL) ×1 IMPLANT
EVACUATOR MICROVAS BLADDER (UROLOGICAL SUPPLIES) IMPLANT
GLOVE BIO SURGEON STRL SZ7.5 (GLOVE) ×2 IMPLANT
GOWN STRL REUS W/ TWL LRG LVL3 (GOWN DISPOSABLE) ×1 IMPLANT
GOWN STRL REUS W/TWL LRG LVL3 (GOWN DISPOSABLE) ×2
KIT ROOM TURNOVER WOR (KITS) ×2 IMPLANT
LOOP CUT BIPOLAR 24F LRG (ELECTROSURGICAL) IMPLANT
MANIFOLD NEPTUNE II (INSTRUMENTS) IMPLANT
PACK CYSTO (CUSTOM PROCEDURE TRAY) ×2 IMPLANT
SET ASPIRATION TUBING (TUBING) IMPLANT
SYRINGE IRR TOOMEY STRL 70CC (SYRINGE) IMPLANT
TUBE CONNECTING 12X1/4 (SUCTIONS) ×1 IMPLANT

## 2015-08-12 NOTE — Transfer of Care (Signed)
  Last Vitals:  Filed Vitals:   08/12/15 0617  BP: 158/81  Pulse: 69  Temp: 36.6 C  Resp: 16    Immediate Anesthesia Transfer of Care Note  Patient: [name]  Procedure(s) Performed: Procedure(s) (LRB): CYSTOSCOPY WITH BIOPSY AND FULGERATION (N/A)  Patient Location: PACU  Anesthesia Type: General  Level of Consciousness: awake, alert  and oriented  Airway & Oxygen Therapy: Patient Spontanous Breathing and Patient connected to nasal cannula oxygen  Post-op Assessment: Report given to PACU RN and Post -op Vital signs reviewed and stable  Post vital signs: Reviewed and stable  Complications: No apparent anesthesia complications

## 2015-08-12 NOTE — H&P (Signed)
Chief Complaint Bladder cancer   History of Present Illness         [name] had TURBT on 12/31/14 for high grade TCC without stromal invasion. He does not have hematuria or straining. He had induction intravesical BCG. He is doing well. He voids with a good flow.  Surveillance cystoscopy today showed no recurrence at his last visit. There are several bladder diverticula. His PSA was 0.26 on 09/19/12.     Interval history:  The patient returns today for his 3 month follow-up. He completed his BCG induction September 2016. He's had no problems since his last visit. Denies any urinary issues at this time. His IPSS score is 4.   Past Medical History Problems  1. History of Gout (M10.9) 2. History of arthritis (Z87.39) 3. History of heartburn [id number])  Surgical History Problems  1. History of No Surgical Problems  Current Meds 1. Hydrocodone-Acetaminophen TABS;  Therapy: (Recorded:19Apr2016) to Recorded 2. Tamsulosin HCl - 0.4 MG Oral Capsule;  Therapy: (Recorded:19Apr2016) to Recorded  Allergies Medication  1. Cipro TABS  Family History Problems  1. Family history of Alcoholism : Father 2. Family history of Alcoholism : Mother 3. Family history of Cirrhosis : Father 4. Family history of Cirrhosis : Mother 71. Family history of Death In The Family Father : Father   CirrhosisAge of death was 46 6. Family history of Death In The Family Mother : Mother   CirrhosisAge of death was 22 7. Family history of Family Health Status Number Of Children   One son  Social History Problems  1. Alcohol Use (History)   Two per day 2. Caffeine Use   Three per day 3. Former smoker [phone]) 4. Marital History - Currently Married 5. Number of children   1 son 6. Retired From Work 7. History of Tobacco Use   Smoked two ppd for 20 yrs  Vitals Vital Signs [Data Includes: Last 1 Day]  Recorded: [id number] 08:09AM  Weight: 210 lb  BMI Calculated: 29.29 BSA Calculated:  2.15 Blood Pressure: 155 / 89 Heart Rate: 82 Respiration: 18  Physical Exam Constitutional: Well nourished . No acute distress.  ENT:. The ears and nose are normal in appearance.  Neck: The appearance of the neck is normal.  Pulmonary: No respiratory distress.  Cardiovascular:. No peripheral edema.  Abdomen: The abdomen is soft and nontender.  Lymphatics: The posterior cervical nodes are not enlarged or tender.  Skin: Normal skin turgor.  Neuro/Psych:. Mood and affect are appropriate.    Results/Data Urine [Data Includes: Last 1 Day]   [id number]  COLOR YELLOW   APPEARANCE CLEAR   SPECIFIC GRAVITY 1.020   pH 6.0   GLUCOSE NEGATIVE   BILIRUBIN NEGATIVE   KETONE NEGATIVE   BLOOD TRACE   PROTEIN NEGATIVE   NITRITE NEGATIVE   LEUKOCYTE ESTERASE NEGATIVE   SQUAMOUS EPITHELIAL/HPF NONE SEEN HPF  WBC NONE SEEN WBC/HPF  RBC 3-10 RBC/HPF  BACTERIA NONE SEEN HPF  CRYSTALS NONE SEEN HPF  CASTS NONE SEEN LPF  Yeast NONE SEEN HPF   Procedure  Procedure: Cystoscopy   Indication: History of Urothelial Carcinoma.  Informed Consent: Risks, benefits, and potential adverse events were discussed and informed consent was obtained. Specific risks including, but not limited to bleeding, infection, pain, allergic reaction etc. were explained.  Prep: The patient was prepped with betadine.  Anesthesia:. Local anesthesia was administered intraurethrally with 2% lidocaine jelly.  Antibiotic prophylaxis: Ciprofloxacin.  Procedure Note:  Urethral meatus:. No abnormalities.  Anterior urethra: No  abnormalities.  Prostatic urethra: No abnormalities.  Bladder: Visulization was clear. erythema, hypervascularity left lateral wall of bladder The patient tolerated the procedure well.  Complications: None.    Assessment Assessed  1. 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 URINE CULTURE; Status:Hold For - Specimen/Data  Collection,Appointment; Requested  for:16Dec2016;  Health Maintenance  2. UA With REFLEX; [Do Not Release]; Status:Complete;   [id number] 07:41AM Malignant neoplasm of lateral wall of bladder  3. URINE CULTURE; Status:Hold For - Specimen/Data Collection,Appointment; Requested  for:16Dec2016;  4. URINE CYTOLOGY W/REFLEX FISH; [Do Not Release]; Status:Hold For -  Specimen/Data Collection,Appointment; Requested for:16Dec2016;   1. 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 We will arrange for him to undergo cystoscopy, bladder biopsy, possible TURBT in the near future. All risks, benefits, and indications of the procedure were described in great detail. The patient is agreeable to proceeding. If pathology is negative, he will be due for maintenance BCG in 3 months.   Signatures Electronically signed by : Baruch Gouty, M.D.; Aug 01 2015  8:38AM EST

## 2015-08-12 NOTE — Op Note (Signed)
Date of procedure: 08/12/2015  Preoperative diagnosis:  1. History of bladder cancer 2. Bladder lesion   Postoperative diagnosis:  1. History of bladder cancer 2. Bladder lesion 3. Meatal stenosis   Procedure: 1. Cystoscopy 2. Bladder biopsy 4 3. Fulguration of bleeders 4. Meatal dilation  Surgeon: Baruch Gouty, MD  Anesthesia: General  Complications: None  Intraoperative findings: The patient had a large diverticulum in the left posterior wall. He also had erythematous areas in the left lateral wall that were biopsied 4.  EBL: None  Specimens: Bladder biopsy 4 cm sent in 1 cup to pathology  Drains: None  Disposition: Stable to the postanesthesia care unit  Indication for procedure: The patient is a 74 y.o. male with history of high-grade non-muscle invasive bladder cancer status post resection and induction BCG has erythematous lesions in the left side of the bladder.  After reviewing the management options for treatment, the patient elected to proceed with the above surgical procedure(s). We have discussed the potential benefits and risks of the procedure, side effects of the proposed treatment, the likelihood of the patient achieving the goals of the procedure, and any potential problems that might occur during the procedure or recuperation. Informed consent has been obtained.  Description of procedure: The patient was met in the preoperative area. All risks, benefits, and indications of the procedure were described in great detail. The patient consented to the procedure. Preoperative antibiotics were given. The patient was taken to the operative theater. General anesthesia was induced per the anesthesia service. The patient was then placed in the dorsal lithotomy position and prepped and draped in the usual sterile fashion. A preoperative timeout was called. At this point, the patient was noted to have meatal stenosis. Using male sounds, his urethra was dilated up to 50  [place]. The cystoscope was inserted into the patient's bladder per urethra atraumatically. Pan cystoscopy revealed the ureters in normal position. There was a large diverticulum on the left posterior lateral wall. There are no lesions noted in the diverticulum. Proximal to this diverticulum on the left posterior lateral wall were noted to be the previous seen erythematous region. These areas were biopsied 4. Specimens were sent to pathology. The areas were fulgurated for bleeding and to remove any remaining erythematous area. Hemostasis was excellent this point. The patient's bladder was then drained and cystoscope was removed. Patient was woken from anesthesia and transferred in stable condition to the postanesthesia care unit.  Plan: The patient will follow-up in one to 2 weeks for pathology results. If these are normal, he will be due for maintenance BCG in approximately 3 months.  Baruch Gouty, M.D.

## 2015-08-12 NOTE — Anesthesia Postprocedure Evaluation (Signed)
Anesthesia Post Note  Patient: [name]  Procedure(s) Performed: Procedure(s) (LRB): CYSTOSCOPY WITH BIOPSY AND FULGERATION (N/A)  Patient location during evaluation: PACU Anesthesia Type: General Level of consciousness: awake and alert Pain management: pain level controlled Vital Signs Assessment: post-procedure vital signs reviewed and stable Respiratory status: spontaneous breathing, nonlabored ventilation, respiratory function stable and patient connected to nasal cannula oxygen Cardiovascular status: blood pressure returned to baseline and stable Postop Assessment: no signs of nausea or vomiting Anesthetic complications: no    Last Vitals:  Filed Vitals:   08/12/15 0830 08/12/15 0840  BP: 153/83   Pulse: 80 84  Temp:    Resp: 14 20    Last Pain:  Filed Vitals:   08/12/15 0944  PainSc: 3                  Nedra Mcinnis L

## 2015-08-12 NOTE — Anesthesia Procedure Notes (Signed)
Procedure Name: LMA Insertion Date/Time: 08/12/2015 7:30 AM Performed by: Mechele Claude Pre-anesthesia Checklist: Patient identified, Emergency Drugs available, Suction available and Patient being monitored Patient Re-evaluated:Patient Re-evaluated prior to inductionOxygen Delivery Method: Circle System Utilized Preoxygenation: Pre-oxygenation with 100% oxygen Intubation Type: IV induction Ventilation: Mask ventilation without difficulty LMA: LMA inserted LMA Size: 5.0 Number of attempts: 1 Airway Equipment and Method: bite block Placement Confirmation: positive ETCO2 Tube secured with: Tape Dental Injury: Teeth and Oropharynx as per pre-operative assessment

## 2015-08-12 NOTE — Discharge Instructions (Signed)
CYSTOSCOPY HOME CARE INSTRUCTIONS ° °Activity: °Rest for the remainder of the day.  Do not drive or operate equipment today.  You may resume normal activities in one to two days as instructed by your physician.  ° °Meals: °Drink plenty of liquids and eat light foods such as gelatin or soup this evening.  You may return to a normal meal plan tomorrow. ° °Return to Work: °You may return to work in one to two days or as instructed by your physician. ° °Special Instructions / Symptoms: °Call your physician if any of these symptoms occur: ° ° -persistent or heavy bleeding ° -bleeding which continues after first few urination ° -large blood clots that are difficult to pass ° -urine stream diminishes or stops completely ° -fever equal to or higher than 101 degrees Farenheit. ° -cloudy urine with a strong, foul odor ° -severe pain ° °Females should always wipe from front to back after elimination.  You may feel some burning pain when you urinate.  This should disappear with time.  Applying moist heat to the lower abdomen or a hot tub bath may help relieve the pain.  ° °Follow-Up / Date of Return Visit to Your Physician: Call for an appointment to arrange follow-up. ° ° ° ° ° °Post Anesthesia Home Care Instructions ° °Activity: °Get plenty of rest for the remainder of the day. A responsible adult should stay with you for 24 hours following the procedure.  °For the next 24 hours, DO NOT: °-Drive a car °-Operate machinery °-Drink alcoholic beverages °-Take any medication unless instructed by your physician °-Make any legal decisions or sign important papers. ° °Meals: °Start with liquid foods such as gelatin or soup. Progress to regular foods as tolerated. Avoid greasy, spicy, heavy foods. If nausea and/or vomiting occur, drink only clear liquids until the nausea and/or vomiting subsides. Call your physician if vomiting continues. ° °Special Instructions/Symptoms: °Your throat may feel dry or sore from the anesthesia or the  breathing tube placed in your throat during surgery. If this causes discomfort, gargle with warm salt water. The discomfort should disappear within 24 hours. ° °If you had a scopolamine patch placed behind your ear for the management of post- operative nausea and/or vomiting: ° °1. The medication in the patch is effective for 72 hours, after which it should be removed.  Wrap patch in a tissue and discard in the trash. Wash hands thoroughly with soap and water. °2. You may remove the patch earlier than 72 hours if you experience unpleasant side effects which may include dry mouth, dizziness or visual disturbances. °3. Avoid touching the patch. Wash your hands with soap and water after contact with the patch. °  ° °

## 2015-08-13 ENCOUNTER — Encounter (HOSPITAL_BASED_OUTPATIENT_CLINIC_OR_DEPARTMENT_OTHER): Payer: Self-pay | Admitting: Urology

## 2015-09-01 DIAGNOSIS — Z0000 Encounter for general adult medical examination without abnormal findings: Secondary | ICD-10-CM | POA: Diagnosis not present

## 2015-09-01 DIAGNOSIS — C672 Malignant neoplasm of lateral wall of bladder: Secondary | ICD-10-CM | POA: Diagnosis not present

## 2015-09-01 DIAGNOSIS — N323 Diverticulum of bladder: Secondary | ICD-10-CM | POA: Diagnosis not present

## 2015-10-07 DIAGNOSIS — H1133 Conjunctival hemorrhage, bilateral: Secondary | ICD-10-CM | POA: Diagnosis not present

## 2015-10-07 DIAGNOSIS — Z6829 Body mass index (BMI) 29.0-29.9, adult: Secondary | ICD-10-CM | POA: Diagnosis not present

## 2015-10-07 DIAGNOSIS — E782 Mixed hyperlipidemia: Secondary | ICD-10-CM | POA: Diagnosis not present

## 2015-10-07 DIAGNOSIS — C672 Malignant neoplasm of lateral wall of bladder: Secondary | ICD-10-CM | POA: Diagnosis not present

## 2015-10-07 DIAGNOSIS — I10 Essential (primary) hypertension: Secondary | ICD-10-CM | POA: Diagnosis not present

## 2015-10-07 DIAGNOSIS — Z1389 Encounter for screening for other disorder: Secondary | ICD-10-CM | POA: Diagnosis not present

## 2015-10-07 DIAGNOSIS — Z0000 Encounter for general adult medical examination without abnormal findings: Secondary | ICD-10-CM | POA: Diagnosis not present

## 2015-10-07 DIAGNOSIS — N400 Enlarged prostate without lower urinary tract symptoms: Secondary | ICD-10-CM | POA: Diagnosis not present

## 2015-10-07 DIAGNOSIS — E663 Overweight: Secondary | ICD-10-CM | POA: Diagnosis not present

## 2015-10-07 DIAGNOSIS — Z5111 Encounter for antineoplastic chemotherapy: Secondary | ICD-10-CM | POA: Diagnosis not present

## 2015-10-14 DIAGNOSIS — Z0000 Encounter for general adult medical examination without abnormal findings: Secondary | ICD-10-CM | POA: Diagnosis not present

## 2015-10-14 DIAGNOSIS — Z5111 Encounter for antineoplastic chemotherapy: Secondary | ICD-10-CM | POA: Diagnosis not present

## 2015-10-14 DIAGNOSIS — C672 Malignant neoplasm of lateral wall of bladder: Secondary | ICD-10-CM | POA: Diagnosis not present

## 2015-10-21 DIAGNOSIS — C672 Malignant neoplasm of lateral wall of bladder: Secondary | ICD-10-CM | POA: Diagnosis not present

## 2015-10-21 DIAGNOSIS — Z0000 Encounter for general adult medical examination without abnormal findings: Secondary | ICD-10-CM | POA: Diagnosis not present

## 2015-10-21 DIAGNOSIS — Z5111 Encounter for antineoplastic chemotherapy: Secondary | ICD-10-CM | POA: Diagnosis not present

## 2015-10-28 DIAGNOSIS — Z6828 Body mass index (BMI) 28.0-28.9, adult: Secondary | ICD-10-CM | POA: Diagnosis not present

## 2015-10-28 DIAGNOSIS — E663 Overweight: Secondary | ICD-10-CM | POA: Diagnosis not present

## 2015-10-28 DIAGNOSIS — Z1389 Encounter for screening for other disorder: Secondary | ICD-10-CM | POA: Diagnosis not present

## 2015-10-28 DIAGNOSIS — J019 Acute sinusitis, unspecified: Secondary | ICD-10-CM | POA: Diagnosis not present

## 2015-10-28 DIAGNOSIS — J301 Allergic rhinitis due to pollen: Secondary | ICD-10-CM | POA: Diagnosis not present

## 2015-10-30 DIAGNOSIS — C672 Malignant neoplasm of lateral wall of bladder: Secondary | ICD-10-CM | POA: Diagnosis not present

## 2015-10-30 DIAGNOSIS — Z0000 Encounter for general adult medical examination without abnormal findings: Secondary | ICD-10-CM | POA: Diagnosis not present

## 2015-11-06 DIAGNOSIS — Z5111 Encounter for antineoplastic chemotherapy: Secondary | ICD-10-CM | POA: Diagnosis not present

## 2015-11-06 DIAGNOSIS — C672 Malignant neoplasm of lateral wall of bladder: Secondary | ICD-10-CM | POA: Diagnosis not present

## 2015-11-06 DIAGNOSIS — Z0000 Encounter for general adult medical examination without abnormal findings: Secondary | ICD-10-CM | POA: Diagnosis not present

## 2015-11-06 DIAGNOSIS — C670 Malignant neoplasm of trigone of bladder: Secondary | ICD-10-CM | POA: Diagnosis not present

## 2015-11-11 DIAGNOSIS — Z5111 Encounter for antineoplastic chemotherapy: Secondary | ICD-10-CM | POA: Diagnosis not present

## 2015-11-11 DIAGNOSIS — C672 Malignant neoplasm of lateral wall of bladder: Secondary | ICD-10-CM | POA: Diagnosis not present

## 2015-11-11 DIAGNOSIS — C670 Malignant neoplasm of trigone of bladder: Secondary | ICD-10-CM | POA: Diagnosis not present

## 2015-11-11 DIAGNOSIS — Z0000 Encounter for general adult medical examination without abnormal findings: Secondary | ICD-10-CM | POA: Diagnosis not present

## 2015-11-18 DIAGNOSIS — Z0000 Encounter for general adult medical examination without abnormal findings: Secondary | ICD-10-CM | POA: Diagnosis not present

## 2015-11-18 DIAGNOSIS — C670 Malignant neoplasm of trigone of bladder: Secondary | ICD-10-CM | POA: Diagnosis not present

## 2015-11-18 DIAGNOSIS — Z5111 Encounter for antineoplastic chemotherapy: Secondary | ICD-10-CM | POA: Diagnosis not present

## 2015-11-18 DIAGNOSIS — C672 Malignant neoplasm of lateral wall of bladder: Secondary | ICD-10-CM | POA: Diagnosis not present

## 2016-01-16 ENCOUNTER — Other Ambulatory Visit: Payer: Self-pay | Admitting: Gastroenterology

## 2016-01-20 ENCOUNTER — Telehealth: Payer: Self-pay | Admitting: Gastroenterology

## 2016-01-20 DIAGNOSIS — R350 Frequency of micturition: Secondary | ICD-10-CM | POA: Diagnosis not present

## 2016-01-20 DIAGNOSIS — N401 Enlarged prostate with lower urinary tract symptoms: Secondary | ICD-10-CM | POA: Diagnosis not present

## 2016-01-20 DIAGNOSIS — C672 Malignant neoplasm of lateral wall of bladder: Secondary | ICD-10-CM | POA: Diagnosis not present

## 2016-01-20 DIAGNOSIS — K209 Esophagitis, unspecified without bleeding: Secondary | ICD-10-CM

## 2016-01-20 MED ORDER — OMEPRAZOLE 40 MG PO CPDR: 40.0000 mg | DELAYED_RELEASE_CAPSULE | Freq: Every day | ORAL | Status: DC

## 2016-01-20 NOTE — Telephone Encounter (Signed)
Prescription sent to patient's pharmacy and patient notified to keep appt for further refills. 

## 2016-02-10 DIAGNOSIS — Z0000 Encounter for general adult medical examination without abnormal findings: Secondary | ICD-10-CM | POA: Diagnosis not present

## 2016-02-10 DIAGNOSIS — Z23 Encounter for immunization: Secondary | ICD-10-CM | POA: Diagnosis not present

## 2016-02-10 DIAGNOSIS — Z6828 Body mass index (BMI) 28.0-28.9, adult: Secondary | ICD-10-CM | POA: Diagnosis not present

## 2016-02-27 DIAGNOSIS — R7309 Other abnormal glucose: Secondary | ICD-10-CM | POA: Diagnosis not present

## 2016-02-27 DIAGNOSIS — E782 Mixed hyperlipidemia: Secondary | ICD-10-CM | POA: Diagnosis not present

## 2016-02-27 DIAGNOSIS — H1133 Conjunctival hemorrhage, bilateral: Secondary | ICD-10-CM | POA: Diagnosis not present

## 2016-02-27 DIAGNOSIS — H10812 Pingueculitis, left eye: Secondary | ICD-10-CM | POA: Diagnosis not present

## 2016-02-27 DIAGNOSIS — Z1389 Encounter for screening for other disorder: Secondary | ICD-10-CM | POA: Diagnosis not present

## 2016-02-27 DIAGNOSIS — Z125 Encounter for screening for malignant neoplasm of prostate: Secondary | ICD-10-CM | POA: Diagnosis not present

## 2016-02-27 DIAGNOSIS — E748 Other specified disorders of carbohydrate metabolism: Secondary | ICD-10-CM | POA: Diagnosis not present

## 2016-03-15 ENCOUNTER — Ambulatory Visit: Payer: Medicare Other | Admitting: Gastroenterology

## 2016-04-01 DIAGNOSIS — H10812 Pingueculitis, left eye: Secondary | ICD-10-CM | POA: Diagnosis not present

## 2016-04-08 DIAGNOSIS — H119 Unspecified disorder of conjunctiva: Secondary | ICD-10-CM | POA: Diagnosis not present

## 2016-04-20 DIAGNOSIS — Z87891 Personal history of nicotine dependence: Secondary | ICD-10-CM | POA: Diagnosis not present

## 2016-04-20 DIAGNOSIS — H119 Unspecified disorder of conjunctiva: Secondary | ICD-10-CM | POA: Diagnosis not present

## 2016-04-20 DIAGNOSIS — Z961 Presence of intraocular lens: Secondary | ICD-10-CM | POA: Diagnosis not present

## 2016-04-20 DIAGNOSIS — H26493 Other secondary cataract, bilateral: Secondary | ICD-10-CM | POA: Diagnosis not present

## 2016-04-27 DIAGNOSIS — N400 Enlarged prostate without lower urinary tract symptoms: Secondary | ICD-10-CM | POA: Diagnosis not present

## 2016-04-27 DIAGNOSIS — Z8551 Personal history of malignant neoplasm of bladder: Secondary | ICD-10-CM | POA: Diagnosis not present

## 2016-05-19 ENCOUNTER — Encounter: Payer: Self-pay | Admitting: Gastroenterology

## 2016-05-19 ENCOUNTER — Ambulatory Visit (INDEPENDENT_AMBULATORY_CARE_PROVIDER_SITE_OTHER): Payer: Medicare Other | Admitting: Gastroenterology

## 2016-05-19 VITALS — BP 130/80 | HR 80 | Ht 70.0 in | Wt 208.6 lb

## 2016-05-19 DIAGNOSIS — R12 Heartburn: Secondary | ICD-10-CM

## 2016-05-19 DIAGNOSIS — Z86010 Personal history of colonic polyps: Secondary | ICD-10-CM

## 2016-05-19 DIAGNOSIS — K219 Gastro-esophageal reflux disease without esophagitis: Secondary | ICD-10-CM | POA: Diagnosis not present

## 2016-05-19 MED ORDER — OMEPRAZOLE 40 MG PO CPDR: 40.0000 mg | DELAYED_RELEASE_CAPSULE | Freq: Every day | ORAL | 11 refills | Status: DC

## 2016-05-19 NOTE — Progress Notes (Signed)
    History of Present Illness: This is a 75 year old male with ongoing heartburn. Heartburn following meals for several weeks after running out of omeprazole. He has elevated the head of his bed and modified his diet however his symptoms persist. His symptoms have previously been well controlled on daily omeprazole. Denies weight loss, abdominal pain, constipation, diarrhea, change in stool caliber, melena, hematochezia, nausea, vomiting, dysphagia, chest pain.   Current Medications, Allergies, Past Medical History, Past Surgical History, Family History and Social History were reviewed in [employer] record.  Physical Exam: General: Well developed, well nourished, no acute distress Head: Normocephalic and atraumatic Eyes:  sclerae anicteric, EOMI Ears: Normal auditory acuity Mouth: No deformity or lesions Lungs: Clear throughout to auscultation Heart: Regular rate and rhythm; no murmurs, rubs or bruits Abdomen: Soft, non tender and non distended. No masses, hepatosplenomegaly or hernias noted. Normal Bowel sounds Musculoskeletal: Symmetrical with no gross deformities  Pulses:  Normal pulses noted Extremities: No clubbing, cyanosis, edema or deformities noted Neurological: Alert oriented x 4, grossly nonfocal Psychological:  Alert and cooperative. Normal mood and affect  Assessment and Recommendations:  1. GERD. Follow standard antireflux measures. Resume omeprazole 40 mg daily. If his symptoms are not under very good control with this regimen within the next 3-4 weeks he is advised to call the office or turn for an office appointment. Consider PPI twice a day or EGD if his symptoms are not controlled.  2. Personal history of multiple adenomatous colon polyps. A two-year interval surveillance colonoscopy was recommended which is due in March 2018.  I spent 15 minutes of face-to-face time with the patient. Greater than 50% of the time was spent counseling and  coordinating care.

## 2016-05-19 NOTE — Patient Instructions (Signed)
We have sent the following medications to your pharmacy for you to pick up at your convenience: omeprazole.   You will be due for a recall colonoscopy in 10/2016. We will send you a reminder in the mail when it gets closer to that time.  Thank you for choosing me and [facility].  [name] Dagoberto Ligas., MD., Marval Regal

## 2016-05-25 DIAGNOSIS — E039 Hypothyroidism, unspecified: Secondary | ICD-10-CM | POA: Diagnosis not present

## 2016-05-27 DIAGNOSIS — R21 Rash and other nonspecific skin eruption: Secondary | ICD-10-CM | POA: Diagnosis not present

## 2016-06-15 DIAGNOSIS — Z9841 Cataract extraction status, right eye: Secondary | ICD-10-CM | POA: Diagnosis not present

## 2016-06-15 DIAGNOSIS — Z79899 Other long term (current) drug therapy: Secondary | ICD-10-CM | POA: Diagnosis not present

## 2016-06-15 DIAGNOSIS — Z87891 Personal history of nicotine dependence: Secondary | ICD-10-CM | POA: Diagnosis not present

## 2016-06-15 DIAGNOSIS — Z961 Presence of intraocular lens: Secondary | ICD-10-CM | POA: Diagnosis not present

## 2016-06-15 DIAGNOSIS — Z9842 Cataract extraction status, left eye: Secondary | ICD-10-CM | POA: Diagnosis not present

## 2016-06-15 DIAGNOSIS — H119 Unspecified disorder of conjunctiva: Secondary | ICD-10-CM | POA: Diagnosis not present

## 2016-06-15 DIAGNOSIS — H26493 Other secondary cataract, bilateral: Secondary | ICD-10-CM | POA: Diagnosis not present

## 2016-06-15 DIAGNOSIS — E039 Hypothyroidism, unspecified: Secondary | ICD-10-CM | POA: Diagnosis not present

## 2016-07-06 DIAGNOSIS — J9801 Acute bronchospasm: Secondary | ICD-10-CM | POA: Diagnosis not present

## 2016-07-06 DIAGNOSIS — I10 Essential (primary) hypertension: Secondary | ICD-10-CM | POA: Diagnosis not present

## 2016-07-06 DIAGNOSIS — J42 Unspecified chronic bronchitis: Secondary | ICD-10-CM | POA: Diagnosis not present

## 2016-07-06 DIAGNOSIS — Z1389 Encounter for screening for other disorder: Secondary | ICD-10-CM | POA: Diagnosis not present

## 2016-07-06 DIAGNOSIS — Z23 Encounter for immunization: Secondary | ICD-10-CM | POA: Diagnosis not present

## 2016-07-06 DIAGNOSIS — K219 Gastro-esophageal reflux disease without esophagitis: Secondary | ICD-10-CM | POA: Diagnosis not present

## 2016-07-06 DIAGNOSIS — G894 Chronic pain syndrome: Secondary | ICD-10-CM | POA: Diagnosis not present

## 2016-07-06 DIAGNOSIS — J449 Chronic obstructive pulmonary disease, unspecified: Secondary | ICD-10-CM | POA: Diagnosis not present

## 2016-07-06 DIAGNOSIS — Z6829 Body mass index (BMI) 29.0-29.9, adult: Secondary | ICD-10-CM | POA: Diagnosis not present

## 2016-07-27 DIAGNOSIS — C679 Malignant neoplasm of bladder, unspecified: Secondary | ICD-10-CM | POA: Diagnosis not present

## 2016-08-03 DIAGNOSIS — Z5111 Encounter for antineoplastic chemotherapy: Secondary | ICD-10-CM | POA: Diagnosis not present

## 2016-08-03 DIAGNOSIS — C672 Malignant neoplasm of lateral wall of bladder: Secondary | ICD-10-CM | POA: Diagnosis not present

## 2016-08-10 DIAGNOSIS — C672 Malignant neoplasm of lateral wall of bladder: Secondary | ICD-10-CM | POA: Diagnosis not present

## 2016-08-10 DIAGNOSIS — Z5111 Encounter for antineoplastic chemotherapy: Secondary | ICD-10-CM | POA: Diagnosis not present

## 2016-08-17 DIAGNOSIS — Z5111 Encounter for antineoplastic chemotherapy: Secondary | ICD-10-CM | POA: Diagnosis not present

## 2016-08-17 DIAGNOSIS — C672 Malignant neoplasm of lateral wall of bladder: Secondary | ICD-10-CM | POA: Diagnosis not present

## 2016-09-15 ENCOUNTER — Encounter: Payer: Self-pay | Admitting: Gastroenterology

## 2016-10-27 ENCOUNTER — Encounter: Payer: Self-pay | Admitting: Gastroenterology

## 2016-11-04 DIAGNOSIS — H119 Unspecified disorder of conjunctiva: Secondary | ICD-10-CM | POA: Diagnosis not present

## 2016-11-04 DIAGNOSIS — H26493 Other secondary cataract, bilateral: Secondary | ICD-10-CM | POA: Diagnosis not present

## 2016-11-22 DIAGNOSIS — R300 Dysuria: Secondary | ICD-10-CM | POA: Diagnosis not present

## 2016-11-30 DIAGNOSIS — R300 Dysuria: Secondary | ICD-10-CM | POA: Diagnosis not present

## 2016-11-30 DIAGNOSIS — C678 Malignant neoplasm of overlapping sites of bladder: Secondary | ICD-10-CM | POA: Diagnosis not present

## 2016-11-30 DIAGNOSIS — N5201 Erectile dysfunction due to arterial insufficiency: Secondary | ICD-10-CM | POA: Diagnosis not present

## 2016-12-09 DIAGNOSIS — R300 Dysuria: Secondary | ICD-10-CM | POA: Diagnosis not present

## 2016-12-13 DIAGNOSIS — E782 Mixed hyperlipidemia: Secondary | ICD-10-CM | POA: Diagnosis not present

## 2016-12-13 DIAGNOSIS — M109 Gout, unspecified: Secondary | ICD-10-CM | POA: Diagnosis not present

## 2016-12-13 DIAGNOSIS — Z1389 Encounter for screening for other disorder: Secondary | ICD-10-CM | POA: Diagnosis not present

## 2016-12-13 DIAGNOSIS — Z6828 Body mass index (BMI) 28.0-28.9, adult: Secondary | ICD-10-CM | POA: Diagnosis not present

## 2016-12-13 DIAGNOSIS — G894 Chronic pain syndrome: Secondary | ICD-10-CM | POA: Diagnosis not present

## 2016-12-13 DIAGNOSIS — R300 Dysuria: Secondary | ICD-10-CM | POA: Diagnosis not present

## 2016-12-13 DIAGNOSIS — C678 Malignant neoplasm of overlapping sites of bladder: Secondary | ICD-10-CM | POA: Diagnosis not present

## 2016-12-13 DIAGNOSIS — I10 Essential (primary) hypertension: Secondary | ICD-10-CM | POA: Diagnosis not present

## 2016-12-13 DIAGNOSIS — J449 Chronic obstructive pulmonary disease, unspecified: Secondary | ICD-10-CM | POA: Diagnosis not present

## 2016-12-13 DIAGNOSIS — E663 Overweight: Secondary | ICD-10-CM | POA: Diagnosis not present

## 2016-12-13 DIAGNOSIS — N5201 Erectile dysfunction due to arterial insufficiency: Secondary | ICD-10-CM | POA: Diagnosis not present

## 2016-12-13 DIAGNOSIS — K219 Gastro-esophageal reflux disease without esophagitis: Secondary | ICD-10-CM | POA: Diagnosis not present

## 2016-12-17 DIAGNOSIS — M722 Plantar fascial fibromatosis: Secondary | ICD-10-CM | POA: Diagnosis not present

## 2016-12-17 DIAGNOSIS — C678 Malignant neoplasm of overlapping sites of bladder: Secondary | ICD-10-CM | POA: Diagnosis not present

## 2016-12-17 DIAGNOSIS — C679 Malignant neoplasm of bladder, unspecified: Secondary | ICD-10-CM | POA: Diagnosis not present

## 2016-12-29 ENCOUNTER — Telehealth: Payer: Self-pay

## 2016-12-29 ENCOUNTER — Ambulatory Visit (AMBULATORY_SURGERY_CENTER): Payer: Self-pay

## 2016-12-29 VITALS — Ht 71.0 in | Wt 202.4 lb

## 2016-12-29 DIAGNOSIS — Z860100 Personal history of colon polyps, unspecified: Secondary | ICD-10-CM

## 2016-12-29 MED ORDER — SUPREP BOWEL PREP KIT 17.5-3.13-1.6 GM/177ML PO SOLN: 1.0000 | Freq: Once | ORAL | 0 refills | Status: AC

## 2016-12-29 NOTE — Progress Notes (Signed)
Bladder cancer  Radioactive implant treatment in the last year  No allergies to eggs or soy No diet meds No home oxygen No past problems with anesthesia  Registered emmi

## 2016-12-29 NOTE — Telephone Encounter (Signed)
Dr Fuller Plan,   Pt has bladder cancer.  Last treatment by injection was Jan 2018 Just FYI.  Pt doing well except for complaints of pain.  Takes narcotic pain killer. Thanks, Angela/PV

## 2017-01-05 DIAGNOSIS — C678 Malignant neoplasm of overlapping sites of bladder: Secondary | ICD-10-CM | POA: Diagnosis not present

## 2017-01-05 DIAGNOSIS — R300 Dysuria: Secondary | ICD-10-CM | POA: Diagnosis not present

## 2017-01-12 ENCOUNTER — Ambulatory Visit (AMBULATORY_SURGERY_CENTER): Payer: Medicare Other | Admitting: Gastroenterology

## 2017-01-12 ENCOUNTER — Encounter: Payer: Self-pay | Admitting: Gastroenterology

## 2017-01-12 VITALS — BP 146/98 | HR 70 | Temp 98.7°F | Resp 12 | Ht 70.0 in | Wt 208.0 lb

## 2017-01-12 DIAGNOSIS — Z86010 Personal history of colonic polyps: Secondary | ICD-10-CM

## 2017-01-12 DIAGNOSIS — D123 Benign neoplasm of transverse colon: Secondary | ICD-10-CM | POA: Diagnosis not present

## 2017-01-12 DIAGNOSIS — D122 Benign neoplasm of ascending colon: Secondary | ICD-10-CM

## 2017-01-12 DIAGNOSIS — D120 Benign neoplasm of cecum: Secondary | ICD-10-CM

## 2017-01-12 DIAGNOSIS — Z1211 Encounter for screening for malignant neoplasm of colon: Secondary | ICD-10-CM | POA: Diagnosis not present

## 2017-01-12 MED ORDER — SODIUM CHLORIDE 0.9 % IV SOLN: 500.0000 mL | INTRAVENOUS | Status: DC

## 2017-01-12 NOTE — Patient Instructions (Signed)
YOU HAD AN ENDOSCOPIC PROCEDURE TODAY AT [facility]:   Refer to the procedure report that was given to you for any specific questions about what was found during the examination.  If the procedure report does not answer your questions, please call your gastroenterologist to clarify.  If you requested that your care partner not be given the details of your procedure findings, then the procedure report has been included in a sealed envelope for you to review at your convenience later.  YOU SHOULD EXPECT: Some feelings of bloating in the abdomen. Passage of more gas than usual.  Walking can help get rid of the air that was put into your GI tract during the procedure and reduce the bloating. If you had a lower endoscopy (such as a colonoscopy or flexible sigmoidoscopy) you may notice spotting of blood in your stool or on the toilet paper. If you underwent a bowel prep for your procedure, you may not have a normal bowel movement for a few days.  Please Note:  You might notice some irritation and congestion in your nose or some drainage.  This is from the oxygen used during your procedure.  There is no need for concern and it should clear up in a day or so.  SYMPTOMS TO REPORT IMMEDIATELY:   Following lower endoscopy (colonoscopy or flexible sigmoidoscopy):  Excessive amounts of blood in the stool  Significant tenderness or worsening of abdominal pains  Swelling of the abdomen that is new, acute  Fever of 100F or higher   For urgent or emergent issues, a gastroenterologist can be reached at any hour by calling [phone].   DIET:  We do recommend a small meal at first, but then you may proceed to your regular diet.  Drink plenty of fluids but you should avoid alcoholic beverages for 24 hours. Try to increase the fiber in your diet, and drink plenty of water.  ACTIVITY:  You should plan to take it easy for the rest of today and you should NOT DRIVE or use heavy machinery until  tomorrow (because of the sedation medicines used during the test).    FOLLOW UP: Our staff will call the number listed on your records the next business day following your procedure to check on you and address any questions or concerns that you may have regarding the information given to you following your procedure. If we do not reach you, we will leave a message.  However, if you are feeling well and you are not experiencing any problems, there is no need to return our call.  We will assume that you have returned to your regular daily activities without incident.  If any biopsies were taken you will be contacted by phone or by letter within the next 1-3 weeks.  Please call us at [phone] if you have not heard about the biopsies in 3 weeks.    SIGNATURES/CONFIDENTIALITY: You and/or your care partner have signed paperwork which will be entered into your electronic medical record.  These signatures attest to the fact that that the information above on your After Visit Summary has been reviewed and is understood.  Full responsibility of the confidentiality of this discharge information lies with you and/or your care-partner.  Recall colonoscopy in 2 years with a more extensive prep. Read all of the handouts given to you by your recovery room nurse.

## 2017-01-12 NOTE — Op Note (Signed)
[place] Patient Name: [name] Procedure Date: 01/12/2017 9:25 AM MRN: [id number] Endoscopist: Ladene Artist , MD Age: 76 Referring MD:  Date of Birth: 1941/08/09 Gender: Male Account #: [id number] Procedure:                Colonoscopy Indications:              Surveillance: History of numerous (> 10) adenomas                            on last colonoscopy (< 3 yrs) Medicines:                Monitored Anesthesia Care Procedure:                Pre-Anesthesia Assessment:                           - Prior to the procedure, a History and Physical                            was performed, and patient medications and                            allergies were reviewed. The patient's tolerance of                            previous anesthesia was also reviewed. The risks                            and benefits of the procedure and the sedation                            options and risks were discussed with the patient.                            All questions were answered, and informed consent                            was obtained. Prior Anticoagulants: The patient has                            taken no previous anticoagulant or antiplatelet                            agents. ASA Grade Assessment: II - A patient with                            mild systemic disease. After reviewing the risks                            and benefits, the patient was deemed in                            satisfactory condition to undergo the procedure.  After obtaining informed consent, the colonoscope                            was passed under direct vision. Throughout the                            procedure, the patient's blood pressure, pulse, and                            oxygen saturations were monitored continuously. The                            Model PCF-H190DL [id number]) scope was introduced                            through the anus and advanced  to the the cecum,                            identified by appendiceal orifice and ileocecal                            valve. The ileocecal valve, appendiceal orifice,                            and rectum were photographed. The quality of the                            bowel preparation was adequate to identify polyps 6                            mm and larger in size after extensive lavage and                            suctioning. The colonoscopy was performed without                            difficulty. The patient tolerated the procedure                            well. Scope In: 9:30:18 AM Scope Out: 9:47:19 AM Scope Withdrawal Time: 0 hours 15 minutes 18 seconds  Total Procedure Duration: 0 hours 17 minutes 1 second  Findings:                 The perianal and digital rectal examinations were                            normal.                           Three sessile polyps were found in the transverse                            colon (2) and cecum (1). The polyps were 7 mm in  size. These polyps were removed with a cold snare.                            Resection and retrieval were complete.                           Three sessile polyps were found in the ascending                            colon. The polyps were 4 to 5 mm in size. These                            polyps were removed with a cold biopsy forceps.                            Resection and retrieval were complete.                           A few medium-mouthed diverticula were found in the                            sigmoid colon.                           Internal hemorrhoids were found during                            retroflexion. The hemorrhoids were small and Grade                            I (internal hemorrhoids that do not prolapse).                           The exam was otherwise without abnormality on                            direct and retroflexion views. Complications:             No immediate complications. Estimated blood loss:                            None. Estimated Blood Loss:     Estimated blood loss: none. Impression:               - Three 7 mm polyps in the transverse colon and in                            the cecum, removed with a cold snare. Resected and                            retrieved.                           - Three 4 to 5 mm polyps in the ascending colon,  removed with a cold biopsy forceps. Resected and                            retrieved.                           - Diverticulosis in the sigmoid colon.                           - Internal hemorrhoids.                           - The examination was otherwise normal on direct                            and retroflexion views. Recommendation:           - Repeat colonoscopy in 2 years for surveillance                            with a more extensive bowel prep.                           - Patient has a contact number available for                            emergencies. The signs and symptoms of potential                            delayed complications were discussed with the                            patient. Return to normal activities tomorrow.                            Written discharge instructions were provided to the                            patient.                           - Resume previous diet.                           - Continue present medications. Ladene Artist, MD 01/12/2017 9:52:18 AM This report has been signed electronically.

## 2017-01-12 NOTE — Progress Notes (Signed)
Pt's states no medical or surgical changes since previsit or office visit. 

## 2017-01-12 NOTE — Progress Notes (Signed)
A and O x3. Report to RN. Tolerated MAC anesthesia well.

## 2017-01-12 NOTE — Progress Notes (Signed)
Called to room to assist during endoscopic procedure.  Patient ID and intended procedure confirmed with present staff. Received instructions for my participation in the procedure from the performing physician.  

## 2017-01-13 ENCOUNTER — Telehealth: Payer: Self-pay | Admitting: *Deleted

## 2017-01-13 NOTE — Telephone Encounter (Signed)
  Follow up Call-  Call back number 01/12/2017 12/05/2014 11/05/2014  Post procedure Call Back phone  # [phone]  Permission to leave phone message Yes Yes Yes  Some recent data might be hidden     Patient questions:  Do you have a fever, pain , or abdominal swelling? No. Pain Score  0 *  Have you tolerated food without any problems? Yes.    Have you been able to return to your normal activities? Yes.    Do you have any questions about your discharge instructions: Diet   No. Medications  No. Follow up visit  No.  Do you have questions or concerns about your Care? No.  Actions: * If pain score is 4 or above: No action needed, pain <4.

## 2017-01-27 ENCOUNTER — Encounter: Payer: Self-pay | Admitting: Gastroenterology

## 2017-01-31 DIAGNOSIS — J449 Chronic obstructive pulmonary disease, unspecified: Secondary | ICD-10-CM | POA: Diagnosis not present

## 2017-01-31 DIAGNOSIS — R300 Dysuria: Secondary | ICD-10-CM | POA: Diagnosis not present

## 2017-01-31 DIAGNOSIS — Z6827 Body mass index (BMI) 27.0-27.9, adult: Secondary | ICD-10-CM | POA: Diagnosis not present

## 2017-01-31 DIAGNOSIS — G894 Chronic pain syndrome: Secondary | ICD-10-CM | POA: Diagnosis not present

## 2017-02-14 DIAGNOSIS — R300 Dysuria: Secondary | ICD-10-CM | POA: Diagnosis not present

## 2017-02-14 DIAGNOSIS — N359 Urethral stricture, unspecified: Secondary | ICD-10-CM | POA: Diagnosis not present

## 2017-03-01 DIAGNOSIS — Z6828 Body mass index (BMI) 28.0-28.9, adult: Secondary | ICD-10-CM | POA: Diagnosis not present

## 2017-03-01 DIAGNOSIS — Z1389 Encounter for screening for other disorder: Secondary | ICD-10-CM | POA: Diagnosis not present

## 2017-03-01 DIAGNOSIS — E663 Overweight: Secondary | ICD-10-CM | POA: Diagnosis not present

## 2017-03-01 DIAGNOSIS — Z0000 Encounter for general adult medical examination without abnormal findings: Secondary | ICD-10-CM | POA: Diagnosis not present

## 2017-03-08 DIAGNOSIS — R300 Dysuria: Secondary | ICD-10-CM | POA: Diagnosis not present

## 2017-03-08 DIAGNOSIS — C678 Malignant neoplasm of overlapping sites of bladder: Secondary | ICD-10-CM | POA: Diagnosis not present

## 2017-03-09 ENCOUNTER — Other Ambulatory Visit: Payer: Self-pay | Admitting: Urology

## 2017-03-09 ENCOUNTER — Encounter (HOSPITAL_BASED_OUTPATIENT_CLINIC_OR_DEPARTMENT_OTHER): Payer: Self-pay | Admitting: *Deleted

## 2017-03-10 ENCOUNTER — Encounter (HOSPITAL_BASED_OUTPATIENT_CLINIC_OR_DEPARTMENT_OTHER): Payer: Self-pay | Admitting: *Deleted

## 2017-03-11 ENCOUNTER — Encounter (HOSPITAL_BASED_OUTPATIENT_CLINIC_OR_DEPARTMENT_OTHER): Payer: Self-pay | Admitting: *Deleted

## 2017-03-11 NOTE — Progress Notes (Signed)
NPO AFTER MN.  ARRIVE AT 1000.  NEEDS HG.

## 2017-03-21 ENCOUNTER — Encounter (HOSPITAL_BASED_OUTPATIENT_CLINIC_OR_DEPARTMENT_OTHER)
Admission: RE | Disposition: A | Discharge status: Home / Self Care | Payer: Self-pay | Source: Ambulatory Visit | Attending: Urology

## 2017-03-21 ENCOUNTER — Encounter (HOSPITAL_BASED_OUTPATIENT_CLINIC_OR_DEPARTMENT_OTHER): Payer: Self-pay | Admitting: Anesthesiology

## 2017-03-21 ENCOUNTER — Ambulatory Visit (HOSPITAL_BASED_OUTPATIENT_CLINIC_OR_DEPARTMENT_OTHER): Payer: Medicare Other | Admitting: Anesthesiology

## 2017-03-21 ENCOUNTER — Ambulatory Visit (HOSPITAL_BASED_OUTPATIENT_CLINIC_OR_DEPARTMENT_OTHER)
Admission: RE | Admit: 2017-03-21 | Discharge: 2017-03-21 | Disposition: A | Discharge status: Home / Self Care | Payer: Medicare Other | Source: Ambulatory Visit | Attending: Urology | Admitting: Urology

## 2017-03-21 DIAGNOSIS — C679 Malignant neoplasm of bladder, unspecified: Secondary | ICD-10-CM | POA: Insufficient documentation

## 2017-03-21 DIAGNOSIS — K219 Gastro-esophageal reflux disease without esophagitis: Secondary | ICD-10-CM | POA: Insufficient documentation

## 2017-03-21 DIAGNOSIS — Z811 Family history of alcohol abuse and dependence: Secondary | ICD-10-CM | POA: Diagnosis not present

## 2017-03-21 DIAGNOSIS — Z8551 Personal history of malignant neoplasm of bladder: Secondary | ICD-10-CM | POA: Diagnosis not present

## 2017-03-21 DIAGNOSIS — Z87891 Personal history of nicotine dependence: Secondary | ICD-10-CM | POA: Diagnosis not present

## 2017-03-21 DIAGNOSIS — R300 Dysuria: Secondary | ICD-10-CM | POA: Insufficient documentation

## 2017-03-21 DIAGNOSIS — Z87448 Personal history of other diseases of urinary system: Secondary | ICD-10-CM | POA: Diagnosis not present

## 2017-03-21 DIAGNOSIS — C678 Malignant neoplasm of overlapping sites of bladder: Secondary | ICD-10-CM

## 2017-03-21 DIAGNOSIS — J449 Chronic obstructive pulmonary disease, unspecified: Secondary | ICD-10-CM | POA: Diagnosis not present

## 2017-03-21 DIAGNOSIS — D494 Neoplasm of unspecified behavior of bladder: Secondary | ICD-10-CM | POA: Diagnosis not present

## 2017-03-21 HISTORY — DX: Personal history of other diseases of urinary system: Z87.448

## 2017-03-21 HISTORY — PX: TRANSURETHRAL RESECTION OF BLADDER TUMOR: SHX2575

## 2017-03-21 HISTORY — DX: Dysuria: R30.0

## 2017-03-21 HISTORY — PX: CYSTOSCOPY WITH URETHRAL DILATATION: SHX5125

## 2017-03-21 HISTORY — DX: Personal history of malignant neoplasm of bladder: Z85.51

## 2017-03-21 LAB — POCT HEMOGLOBIN-HEMACUE: Hemoglobin: 17.5 g/dL — ABNORMAL HIGH (ref 13.0–17.0)

## 2017-03-21 SURGERY — CYSTOSCOPY, WITH URETHRAL DILATION
Anesthesia: General

## 2017-03-21 MED ORDER — LIDOCAINE 2% (20 MG/ML) 5 ML SYRINGE
INTRAMUSCULAR | Status: DC | PRN
  Administered 2017-03-21: 100 mg via INTRAVENOUS

## 2017-03-21 MED ORDER — PROPOFOL 10 MG/ML IV BOLUS
INTRAVENOUS | Status: AC
  Filled 2017-03-21: qty 20

## 2017-03-21 MED ORDER — ONDANSETRON HCL 4 MG/2ML IJ SOLN
INTRAMUSCULAR | Status: DC | PRN
  Administered 2017-03-21: 4 mg via INTRAVENOUS

## 2017-03-21 MED ORDER — HYDROMORPHONE HCL 1 MG/ML IJ SOLN
0.2500 mg | INTRAMUSCULAR | Status: DC | PRN
  Administered 2017-03-21: 0.25 mg via INTRAVENOUS
  Filled 2017-03-21: qty 0.5

## 2017-03-21 MED ORDER — TAMSULOSIN HCL 0.4 MG PO CAPS
0.4000 mg | ORAL_CAPSULE | Freq: Once | ORAL | Status: AC
  Administered 2017-03-21: 0.4 mg via ORAL
  Filled 2017-03-21: qty 1

## 2017-03-21 MED ORDER — MIDAZOLAM HCL 5 MG/5ML IJ SOLN
INTRAMUSCULAR | Status: DC | PRN
  Administered 2017-03-21: 1 mg via INTRAVENOUS

## 2017-03-21 MED ORDER — FENTANYL CITRATE (PF) 100 MCG/2ML IJ SOLN
INTRAMUSCULAR | Status: DC | PRN
  Administered 2017-03-21: 100 ug via INTRAVENOUS

## 2017-03-21 MED ORDER — KETOROLAC TROMETHAMINE 30 MG/ML IJ SOLN
INTRAMUSCULAR | Status: DC | PRN
  Administered 2017-03-21: 30 mg via INTRAVENOUS

## 2017-03-21 MED ORDER — ONDANSETRON HCL 4 MG/2ML IJ SOLN
INTRAMUSCULAR | Status: AC
  Filled 2017-03-21: qty 2

## 2017-03-21 MED ORDER — MIDAZOLAM HCL 2 MG/2ML IJ SOLN
INTRAMUSCULAR | Status: AC
  Filled 2017-03-21: qty 2

## 2017-03-21 MED ORDER — PROMETHAZINE HCL 25 MG/ML IJ SOLN
6.2500 mg | INTRAMUSCULAR | Status: DC | PRN
  Filled 2017-03-21: qty 1

## 2017-03-21 MED ORDER — HYDROCODONE-ACETAMINOPHEN 5-325 MG PO TABS: 1.0000 | ORAL_TABLET | ORAL | 0 refills | Status: DC | PRN

## 2017-03-21 MED ORDER — ISONIAZID 300 MG PO TABS: 300.0000 mg | ORAL_TABLET | Freq: Every day | ORAL | 0 refills | Status: DC

## 2017-03-21 MED ORDER — LACTATED RINGERS IV SOLN
INTRAVENOUS | Status: DC
  Administered 2017-03-21 (×3): via INTRAVENOUS
  Filled 2017-03-21: qty 1000

## 2017-03-21 MED ORDER — PROPOFOL 10 MG/ML IV BOLUS
INTRAVENOUS | Status: DC | PRN
  Administered 2017-03-21: 170 mg via INTRAVENOUS

## 2017-03-21 MED ORDER — HYDROMORPHONE HCL-NACL 0.5-0.9 MG/ML-% IV SOSY
PREFILLED_SYRINGE | INTRAVENOUS | Status: AC
  Filled 2017-03-21: qty 1

## 2017-03-21 MED ORDER — TAMSULOSIN HCL 0.4 MG PO CAPS
ORAL_CAPSULE | ORAL | Status: AC
  Filled 2017-03-21: qty 1

## 2017-03-21 MED ORDER — DEXAMETHASONE SODIUM PHOSPHATE 10 MG/ML IJ SOLN
INTRAMUSCULAR | Status: AC
  Filled 2017-03-21: qty 1

## 2017-03-21 MED ORDER — HYDROCODONE-ACETAMINOPHEN 5-325 MG PO TABS
1.0000 | ORAL_TABLET | Freq: Once | ORAL | Status: AC
  Administered 2017-03-21: 1 via ORAL
  Filled 2017-03-21: qty 1

## 2017-03-21 MED ORDER — CEFAZOLIN SODIUM-DEXTROSE 2-4 GM/100ML-% IV SOLN
2.0000 g | INTRAVENOUS | Status: AC
  Administered 2017-03-21: 2 g via INTRAVENOUS
  Filled 2017-03-21: qty 100

## 2017-03-21 MED ORDER — HYDROCODONE-ACETAMINOPHEN 5-325 MG PO TABS
ORAL_TABLET | ORAL | Status: AC
  Filled 2017-03-21: qty 1

## 2017-03-21 MED ORDER — SODIUM CHLORIDE 0.9 % IJ SOLN
INTRAMUSCULAR | Status: AC
  Filled 2017-03-21: qty 10

## 2017-03-21 MED ORDER — DEXAMETHASONE SODIUM PHOSPHATE 4 MG/ML IJ SOLN
INTRAMUSCULAR | Status: DC | PRN
  Administered 2017-03-21: 10 mg via INTRAVENOUS

## 2017-03-21 MED ORDER — CEFAZOLIN SODIUM-DEXTROSE 2-4 GM/100ML-% IV SOLN
INTRAVENOUS | Status: AC
Start: 2017-03-21 — End: ?
  Filled 2017-03-21: qty 100

## 2017-03-21 MED ORDER — CEPHALEXIN 500 MG PO CAPS: 500.0000 mg | ORAL_CAPSULE | Freq: Three times a day (TID) | ORAL | 0 refills | Status: AC

## 2017-03-21 MED ORDER — FENTANYL CITRATE (PF) 100 MCG/2ML IJ SOLN
INTRAMUSCULAR | Status: AC
  Filled 2017-03-21: qty 2

## 2017-03-21 SURGICAL SUPPLY — 28 items
BAG DRAIN URO-CYSTO SKYTR STRL (DRAIN) ×2 IMPLANT
BAG DRN UROCATH (DRAIN) ×1
BAG URINE DRAINAGE (UROLOGICAL SUPPLIES) IMPLANT
BAG URINE LEG 500ML (DRAIN) IMPLANT
CATH FOLEY 2WAY SLVR  5CC 22FR (CATHETERS)
CATH FOLEY 2WAY SLVR 30CC 20FR (CATHETERS) IMPLANT
CATH FOLEY 2WAY SLVR 5CC 22FR (CATHETERS) IMPLANT
CATH INTERMIT  6FR 70CM (CATHETERS) ×2 IMPLANT
CATH URET 5FR 28IN OPEN ENDED (CATHETERS) ×1 IMPLANT
CLOTH BEACON ORANGE TIMEOUT ST (SAFETY) ×2 IMPLANT
ELECT BIVAP BIPO 22/24 DONUT (ELECTROSURGICAL)
ELECT REM PT RETURN 9FT ADLT (ELECTROSURGICAL) ×2
ELECTRD BIVAP BIPO 22/24 DONUT (ELECTROSURGICAL) IMPLANT
ELECTRODE REM PT RTRN 9FT ADLT (ELECTROSURGICAL) ×1 IMPLANT
EVACUATOR MICROVAS BLADDER (UROLOGICAL SUPPLIES) IMPLANT
GLOVE BIO SURGEON STRL SZ7.5 (GLOVE) ×2 IMPLANT
GOWN STRL REUS W/ TWL LRG LVL3 (GOWN DISPOSABLE) ×1 IMPLANT
GOWN STRL REUS W/ TWL XL LVL3 (GOWN DISPOSABLE) ×1 IMPLANT
GOWN STRL REUS W/TWL LRG LVL3 (GOWN DISPOSABLE) ×2
GOWN STRL REUS W/TWL XL LVL3 (GOWN DISPOSABLE) ×2
GUIDEWIRE STR DUAL SENSOR (WIRE) ×2 IMPLANT
KIT RM TURNOVER CYSTO AR (KITS) ×2 IMPLANT
LOOP CUT BIPOLAR 24F LRG (ELECTROSURGICAL) IMPLANT
MANIFOLD NEPTUNE II (INSTRUMENTS) ×2 IMPLANT
PACK CYSTO (CUSTOM PROCEDURE TRAY) ×2 IMPLANT
SET ASPIRATION TUBING (TUBING) IMPLANT
SYRINGE IRR TOOMEY STRL 70CC (SYRINGE) IMPLANT
TUBE CONNECTING 12X1/4 (SUCTIONS) IMPLANT

## 2017-03-21 NOTE — Anesthesia Procedure Notes (Signed)
Procedure Name: LMA Insertion Date/Time: 03/21/2017 1:00 PM Performed by: Wanita Chamberlain Pre-anesthesia Checklist: Patient identified, Timeout performed, Emergency Drugs available, Suction available and Patient being monitored Patient Re-evaluated:Patient Re-evaluated prior to induction Oxygen Delivery Method: Circle system utilized Preoxygenation: Pre-oxygenation with 100% oxygen Induction Type: IV induction Ventilation: Mask ventilation without difficulty LMA: LMA inserted LMA Size: 4.0 Number of attempts: 1 Airway Equipment and Method: Bite block (gauze airway) Placement Confirmation: positive ETCO2 and breath sounds checked- equal and bilateral Tube secured with: Tape Dental Injury: Teeth and Oropharynx as per pre-operative assessment

## 2017-03-21 NOTE — Anesthesia Preprocedure Evaluation (Addendum)
Anesthesia Evaluation  Patient identified by MRN, date of birth, ID band Patient awake    Reviewed: Allergy & Precautions, NPO status , Patient's Chart, lab work & pertinent test results  Airway Mallampati: II  TM Distance: >3 FB Neck ROM: Full    Dental  (+) Upper Dentures, Caps, Dental Advisory Given,    Pulmonary COPD, former smoker,    breath sounds clear to auscultation       Cardiovascular negative cardio ROS   Rhythm:Regular Rate:Normal     Neuro/Psych    GI/Hepatic Neg liver ROS, hiatal hernia, GERD  Medicated,  Endo/Other  negative endocrine ROS  Renal/GU negative Renal ROS Bladder dysfunction      Musculoskeletal negative musculoskeletal ROS (+)   Abdominal   Peds  Hematology negative hematology ROS (+)   Anesthesia Other Findings   Reproductive/Obstetrics                           Anesthesia Physical Anesthesia Plan  ASA: II  Anesthesia Plan: General   Post-op Pain Management:    Induction: Intravenous  PONV Risk Score and Plan: 3 and Ondansetron, Dexamethasone, Midazolam, Propofol infusion and Treatment may vary due to age or medical condition  Airway Management Planned: LMA  Additional Equipment:   Intra-op Plan:   Post-operative Plan: Extubation in OR  Informed Consent: I have reviewed the patients History and Physical, chart, labs and discussed the procedure including the risks, benefits and alternatives for the proposed anesthesia with the patient or authorized representative who has indicated his/her understanding and acceptance.   Dental advisory given  Plan Discussed with: CRNA  Anesthesia Plan Comments:         Anesthesia Quick Evaluation

## 2017-03-21 NOTE — Op Note (Signed)
Date of procedure: 03/21/17  Preoperative diagnosis:  1. History of bladder cancer 2. History of urethral stricture 3. Dysuria   Postoperative diagnosis:  1. Same   Procedure: 1. Cystoscopy 2. Transurethral resection of bladder tumor 3 cm  Surgeon: Baruch Gouty, MD  Anesthesia: General  Complications: None  Intraoperative findings: The patient had a previous bulbar urethra stricture that was wide open on cystoscopy. His urethra was unremarkable. His prostate was mildly visually obstructed. 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 He had a large diverticulum on the floor of the bladder. He had a necrotic area in his previous resection site that was resected. This was on the right lateral wall.  EBL: None  Specimens: Bladder tumor to pathology  Drains: None  Disposition: Stable to the postanesthesia care unit  Indication for procedure: The patient is a 76 y.o. male with history of bladder cancer status post BCG developed severe dysuria after his last dose of maintenance BCG. Cultures were negative. Ciprofloxacin for a month and not treat his symptoms. Urine cytology was negative. He was unable to tolerate office follow-up cystoscopy. He was given a prescription for isoniazid for proceeding BCG cystitis though elected to wait to start this medication prior to cystoscopy.  After reviewing the management options for treatment, the patient elected to proceed with the above surgical procedure(s). We have discussed the potential benefits and risks of the procedure, side effects of the proposed treatment, the likelihood of the patient achieving the goals of the procedure, and any potential problems that might occur during the procedure or recuperation. Informed consent has been obtained.  Description of procedure: The patient was met in the preoperative area. All risks, benefits, and indications of the procedure were described in great detail. The patient  consented to the procedure. Preoperative antibiotics were given. The patient was taken to the operative theater. General anesthesia was induced per the anesthesia service. The patient was then placed in the dorsal lithotomy position and prepped and draped in the usual sterile fashion. A preoperative timeout was called.   A 24 French 30 resectoscope with visual obturator was inserted in patient's bladder per urethra atraumatically. His previous bulbar urethra stricture was wide open. His urethra was normal. He had mild to moderate prostatic visual obstruction. He had a large diverticulum on the floor the bladder. Pan cystoscopy revealed hypervascular bladder. He also had a necrotic area in the right lateral wall and a previous resection site near his bladder neck. This area was resected. Upon securing of centimeters. Hemostasis was then obtained and was excellent. No tumors other than the stone was seen. Tumor chips were then evacuated and sent for pathology. There was muscle in the base the specimen. At this point, the patient's bladder was drained as well as from anesthesia and transferred in stable condition to the postanesthesia care unit.  Plan: The patient will need surveillance cystoscopy again in 3 months. We will need to have him follow-up in a few weeks for his pathology results. He'll also be started isoniazid at this time to ensure that his dysuria is not from previous BCG.  Baruch Gouty, M.D.

## 2017-03-21 NOTE — H&P (Signed)
CC: I have bladder cancer.  HPI: [name] is a 76 year-old male established patient who is here for bladder cancer.  His bladder cancer was treated by removal with scope. Patient denies removal of the entire bladder, radiation, and chemotherapy.   He does have a good appetite. His bowels are moving normally.   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 He had induction intravesical BCG. It then recurred in December 2016 as pTa TCC. He underwent repeat induction BCG then maintenance BCG which was completed in January 2018. He returns today for post BCG cystoscopy which showed no evidence of disease. Did show possible prostatic urethral stricture that was passively dilated with cystoscope on 11/30/16. Cystoscopy was otherwise negative.   CT Urogram in May 2018 was negative except for inflammation vs tumor at resection site. Cysto was negative one month prior so likely inflammation.     CC/HPI: [name] presents to see me today for a second opinion as due to ongoing dysuria. He has a history of transitional cell carcinoma bladder. He is status post TURBT and also BCG therapy. He has had some issues with intermittent and chronic dysuria, but things got substantially worse after his last cystoscopy. He reports that he has had constant severe dysuria, status post that procedure. Apparently some degree of posterior urethral stricturing was noted, but this was dilated with the cystoscope. He has not had evidence of cystitis or obvious prostatitis. He was treated empirically with a course of Cipro without improvement. He has been on some Uribel, which helps take away some of the symptoms, but he reports ongoing dysuria and wants something done about it. Urinalysis today is unremarkable.   He saw Dr. Risa Grill for a second opinion who started him on gabapentin.     AUA Symptom Score: He never has the sensation of not emptying his bladder completely after finishing urinating. He  never has to urinate again less that two hours after he has finished urinating. He does not have to stop and start again several times when he urinates. He never finds it difficult to postpone urination. He never has a weak urinary stream. He never has to push or strain to begin urination. He has to get up to urinate 2 times from the time he goes to bed until the time he gets up in the morning.   Calculated AUA Symptom Score: 2    ALLERGIES: Cipro TABS    MEDICATIONS: Phenazopyridine Hcl 100 mg tablet 1 tablet PO TID PRN  Gabapentin 300 mg capsule 1 capsule PO Q HS  Hydrocodone-Acetaminophen 2.5 mg-325 mg tablet Oral  Prilosec 40 mg capsule,delayed release Oral  Sildenafil 20 mg tablet Take 1 - 5 tabs PO daily prn  Tamsulosin Hcl 0.4 mg capsule, ext release 24 hr Oral  Uribel 118 mg-10 mg-40.8 mg-36 mg-0.12 mg capsule Take 1 to 4 tablets PO daily prn     GU PSH: Bladder Instill AntiCA Agent - 08/17/2016, 08/10/2016, 08/03/2016 Cysto Bladder Ureth Biopsy - 2017 Cysto Dilate Stricture (M or F) - 2017 Cysto Fulgurate < 0.5 cm - 2017 Cystoscopy - 11/30/2016, 07/27/2016, 04/27/2016, 01/20/2016 Locm 300-399Mg /Ml Iodine,1Ml - 12/17/2016      PSH Notes: Cystoscopy With Fulguration Minor Lesion (Under 68mm), Cystoscopy For Urethral Stricture, Cystoscopy With Biopsy, No Surgical Problems   NON-GU PSH: None   GU PMH: Bladder Cancer overlapping sites - 01/05/2017, - 12/13/2016, - 11/30/2016 Dysuria - 01/05/2017, - 12/13/2016, - 11/30/2016, - 11/22/2016 ED due  to arterial insufficiency - 12/13/2016, - 11/30/2016 Bladder Cancer, Unspec - 07/27/2016, - 04/27/2016, - 01/20/2016 BPH w/o LUTS - 04/27/2016 BPH w/LUTS - 01/20/2016 Bladder Cancer Lateral, Malignant neoplasm of lateral wall of bladder - 2017 Bladder Diverticulum, Diverticulum, bladder acquired - 2017 Microscopic hematuria, Asymptomatic microscopic hematuria - 08/01/2015 Urinary Tract Inf, Unspec site, Urinary tract infection - 2016 Urethral Stricture,  Unspec, Meatal stenosis - 2016 Bladder, Neoplasm of Unspecified behavior, Bladder tumor - 2016 Bladder-neck stenosis/contracture, Bladder neck contracture - 2014 Renal calculus, Kidney stone on left side - 2014    NON-GU PMH: Encounter for general adult medical examination without abnormal findings, Encounter for preventive health examination - 2016 Personal history of other diseases of the musculoskeletal system and connective tissue, History of arthritis - 2016 Gout, Gout - 2014 Personal history of other specified conditions, History of heartburn - 2014    FAMILY HISTORY: Alcoholism - Mother, Father Cirrhosis - Father, Mother Death In The Family Father - Father Death In The Family Mother - Mother Family Health Status Number - Runs In Family   SOCIAL HISTORY: Marital Status: Married Preferred Language: English; Ethnicity: Not Hispanic Or Latino; Race: White Current Smoking Status: Patient does not smoke anymore. Has not smoked since 04/17/1987.  Drinks 4 drinks per week.  Drinks 1 caffeinated drink per day.     Notes: Former smoker, Number of children, Retired From Work, Alcohol Use, Tobacco Use, Caffeine Use, Marital History - Currently Married   REVIEW OF SYSTEMS:    GU Review Male:   Patient reports frequent urination, burning/ pain with urination, get up at night to urinate, stream starts and stops, and trouble starting your stream. Patient denies hard to postpone urination, leakage of urine, have to strain to urinate , erection problems, and penile pain.  Gastrointestinal (Upper):   Patient denies nausea, vomiting, and indigestion/ heartburn.  Gastrointestinal (Lower):   Patient denies diarrhea and constipation.  Constitutional:   Patient reports weight loss. Patient denies fever, night sweats, and fatigue.  Skin:   Patient denies skin rash/ lesion and itching.  Eyes:   Patient denies blurred vision and double vision.  Ears/ Nose/ Throat:   Patient denies sore throat and sinus  problems.  Hematologic/Lymphatic:   Patient denies swollen glands and easy bruising.  Cardiovascular:   Patient denies leg swelling and chest pains.  Respiratory:   Patient denies cough and shortness of breath.  Endocrine:   Patient denies excessive thirst.  Musculoskeletal:   Patient denies back pain and joint pain.  Neurological:   Patient denies headaches and dizziness.  Psychologic:   Patient denies depression and anxiety.   VITAL SIGNS:      03/08/2017 08:16 AM  BP 184/100 mmHg  Pulse 88 /min  Temperature 97.6 F / 36.4 C   MULTI-SYSTEM PHYSICAL EXAMINATION:    Constitutional: Well-nourished. No physical deformities. Normally developed. Good grooming.  Neck: Neck symmetrical, not swollen. Normal tracheal position.  Respiratory: No labored breathing, no use of accessory muscles.   Cardiovascular: Normal temperature, normal extremity pulses, no swelling, no varicosities.  Skin: No paleness, no jaundice, no cyanosis. No lesion, no ulcer, no rash.  Gastrointestinal: No mass, no tenderness, no rigidity, non obese abdomen.  Eyes: Normal conjunctivae. Normal eyelids.  Ears, Nose, Mouth, and Throat: Left ear no scars, no lesions, no masses. Right ear no scars, no lesions, no masses. Nose no scars, no lesions, no masses. Normal hearing. Normal lips.  Musculoskeletal: Normal gait and station of head and neck.  PAST DATA REVIEWED:  Source Of History:  Patient   07/26/07  PSA  Total PSA 0.41     PROCEDURES:          Urinalysis - 81003 Dipstick Dipstick Cont'd  Color: Green Bilirubin: Neg  Appearance: Clear Ketones: Neg  Specific Gravity: 1.015 Blood: Neg  pH: 7.0 Protein: Neg  Glucose: Neg Urobilinogen: 0.2    Nitrites: Neg    Leukocyte Esterase: Neg    Notes:      ASSESSMENT:      ICD-10 Details  1 GU:   Bladder Cancer overlapping sites - C67.8   2   Dysuria - R30.0 Stable   PLAN:            Medications New Meds: Isoniazid 300 mg tablet 1 tablet PO Daily   #30   0 Refill(s)            Orders Labs Urine Cytology          Schedule Procedure: Unspecified Date - Cystoscopy - 52000          Document Letter(s):  Created for [name], MD   Created for Patient: Clinical Summary         Notes:   The patient was unable to tolerate lidocaine jelly injection due to tenderness when his urethra today. I do not think he'll be able to tolerate a cystoscopy at this point. However, this is necessary to both evaluate for recurrence of bladder tumor as well as recurrence of urethral stricture. Unfortunately, we'll have to do this under anesthesia. We will plan for cystoscopy, possible urethral dilation, and possible TURBT. It is not very clear why the patient has dysuria unless the stricture has returned. Since workup has been negative so far, I have sent [place] to his pharmacy to treat for possible BCG prostatitis. He was warned of the risk of peripheral neuropathy and the need to take vitamin B6 with this medication. He has elected however to hold off on taking this medication until after his cystoscopy. We may also consider bilateral retrograde pyelogram at the time of this procedure.   He will also continue his Flomax and sildenafil when necessary.

## 2017-03-21 NOTE — Discharge Instructions (Signed)
°  Post Anesthesia Home Care Instructions ° °Activity: °Get plenty of rest for the remainder of the day. A responsible individual must stay with you for 24 hours following the procedure.  °For the next 24 hours, DO NOT: °-Drive a car °-Operate machinery °-Drink alcoholic beverages °-Take any medication unless instructed by your physician °-Make any legal decisions or sign important papers. ° °Meals: °Start with liquid foods such as gelatin or soup. Progress to regular foods as tolerated. Avoid greasy, spicy, heavy foods. If nausea and/or vomiting occur, drink only clear liquids until the nausea and/or vomiting subsides. Call your physician if vomiting continues. ° °Special Instructions/Symptoms: °Your throat may feel dry or sore from the anesthesia or the breathing tube placed in your throat during surgery. If this causes discomfort, gargle with warm salt water. The discomfort should disappear within 24 hours. ° °If you had a scopolamine patch placed behind your ear for the management of post- operative nausea and/or vomiting: ° °1. The medication in the patch is effective for 72 hours, after which it should be removed.  Wrap patch in a tissue and discard in the trash. Wash hands thoroughly with soap and water. °2. You may remove the patch earlier than 72 hours if you experience unpleasant side effects which may include dry mouth, dizziness or visual disturbances. °3. Avoid touching the patch. Wash your hands with soap and water after contact with the patch. °  °CYSTOSCOPY HOME CARE INSTRUCTIONS ° °Activity: °Rest for the remainder of the day.  Do not drive or operate equipment today.  You may resume normal activities in one to two days as instructed by your physician.  ° °Meals: °Drink plenty of liquids and eat light foods such as gelatin or soup this evening.  You may return to a normal meal plan tomorrow. ° °Return to Work: °You may return to work in one to two days or as instructed by your physician. ° °Special  Instructions / Symptoms: °Call your physician if any of these symptoms occur: ° ° -persistent or heavy bleeding ° -bleeding which continues after first few urination ° -large blood clots that are difficult to pass ° -urine stream diminishes or stops completely ° -fever equal to or higher than 101 degrees Farenheit. ° -cloudy urine with a strong, foul odor ° -severe pain ° °Females should always wipe from front to back after elimination.  You may feel some burning pain when you urinate.  This should disappear with time.  Applying moist heat to the lower abdomen or a hot tub bath may help relieve the pain. \ ° °Follow-Up / Date of Return Visit to Your Physician:   ° °Call for an appointment to arrange follow-up. ° °Patient Signature:  ________________________________________________________ ° °Nurse's Signature:  ________________________________________________________ ° °

## 2017-03-21 NOTE — Anesthesia Postprocedure Evaluation (Signed)
Anesthesia Post Note  Patient: [name]  Procedure(s) Performed: Procedure(s) (LRB): CYSTOSCOPY (N/A) POSSIBLE TRANSURETHRAL RESECTION OF BLADDER TUMOR (TURBT) (N/A)     Patient location during evaluation: PACU Anesthesia Type: General Level of consciousness: awake and alert Pain management: pain level controlled Vital Signs Assessment: post-procedure vital signs reviewed and stable Respiratory status: spontaneous breathing, nonlabored ventilation, respiratory function stable and patient connected to nasal cannula oxygen Cardiovascular status: blood pressure returned to baseline and stable Postop Assessment: no signs of nausea or vomiting Anesthetic complications: no    Last Vitals:  Vitals:   03/21/17 1400 03/21/17 1415  BP: (!) 178/101 (!) 185/105  Pulse: 87 83  Resp: 15 18  Temp:      Last Pain:  Vitals:   03/21/17 1429  TempSrc:   PainSc: 4                  Islam Eichinger,JAMES TERRILL

## 2017-03-21 NOTE — Transfer of Care (Signed)
Immediate Anesthesia Transfer of Care Note  Patient: [name]  Procedure(s) Performed: Procedure(s): CYSTOSCOPY (N/A) POSSIBLE TRANSURETHRAL RESECTION OF BLADDER TUMOR (TURBT) (N/A)  Patient Location: PACU  Anesthesia Type:General  Level of Consciousness: awake, alert , oriented and patient cooperative  Airway & Oxygen Therapy: Patient Spontanous Breathing and Patient connected to nasal cannula oxygen  Post-op Assessment: Report given to RN and Post -op Vital signs reviewed and stable  Post vital signs: Reviewed and stable  Last Vitals:  Vitals:   03/21/17 0942  BP: (!) 167/89  Pulse: 88  Resp: 16  Temp: 36.4 C    Last Pain:  Vitals:   03/21/17 1019  TempSrc:   PainSc: 5       Patients Stated Pain Goal: 5 (42/70/62 3762)  Complications: No apparent anesthesia complications

## 2017-03-22 ENCOUNTER — Encounter (HOSPITAL_BASED_OUTPATIENT_CLINIC_OR_DEPARTMENT_OTHER): Payer: Self-pay | Admitting: Urology

## 2017-03-31 DIAGNOSIS — C678 Malignant neoplasm of overlapping sites of bladder: Secondary | ICD-10-CM | POA: Diagnosis not present

## 2017-03-31 DIAGNOSIS — R300 Dysuria: Secondary | ICD-10-CM | POA: Diagnosis not present

## 2017-04-05 DIAGNOSIS — C678 Malignant neoplasm of overlapping sites of bladder: Secondary | ICD-10-CM | POA: Diagnosis not present

## 2017-04-05 DIAGNOSIS — R339 Retention of urine, unspecified: Secondary | ICD-10-CM | POA: Diagnosis not present

## 2017-04-08 ENCOUNTER — Encounter: Payer: Self-pay | Admitting: Oncology

## 2017-04-08 ENCOUNTER — Telehealth: Payer: Self-pay | Admitting: Oncology

## 2017-04-08 NOTE — Telephone Encounter (Signed)
Appt has been scheduled for the pt to see Dr. Alen Blew on 9/5 at 11am. Pt aware to arrive 30 minutes early. Letter mailed to the pt and faxed to the pt.

## 2017-04-13 DIAGNOSIS — J90 Pleural effusion, not elsewhere classified: Secondary | ICD-10-CM | POA: Diagnosis not present

## 2017-04-13 DIAGNOSIS — C678 Malignant neoplasm of overlapping sites of bladder: Secondary | ICD-10-CM | POA: Diagnosis not present

## 2017-04-13 DIAGNOSIS — R109 Unspecified abdominal pain: Secondary | ICD-10-CM | POA: Diagnosis not present

## 2017-04-15 DIAGNOSIS — R339 Retention of urine, unspecified: Secondary | ICD-10-CM | POA: Diagnosis not present

## 2017-04-20 ENCOUNTER — Ambulatory Visit (HOSPITAL_BASED_OUTPATIENT_CLINIC_OR_DEPARTMENT_OTHER): Payer: Medicare Other | Admitting: Oncology

## 2017-04-20 ENCOUNTER — Telehealth: Payer: Self-pay | Admitting: Oncology

## 2017-04-20 VITALS — BP 140/69 | HR 75 | Temp 97.7°F | Resp 18 | Ht 71.0 in | Wt 192.7 lb

## 2017-04-20 DIAGNOSIS — C679 Malignant neoplasm of bladder, unspecified: Secondary | ICD-10-CM | POA: Diagnosis not present

## 2017-04-20 MED ORDER — PROCHLORPERAZINE MALEATE 10 MG PO TABS: 10.0000 mg | ORAL_TABLET | Freq: Four times a day (QID) | ORAL | 0 refills | Status: DC | PRN

## 2017-04-20 NOTE — Telephone Encounter (Signed)
Gave patient AVS and calendar of upcoming September appointments.  °

## 2017-04-20 NOTE — Progress Notes (Signed)
START ON PATHWAY REGIMEN - Bladder     A cycle is every 21 days:     Gemcitabine      Cisplatin   **Always confirm dose/schedule in your pharmacy ordering system**  Patient Characteristics: Pre Cystectomy, Clinical T2-T4a, N0-1, M0, Cystectomy Eligible, Cisplatin-Based Chemotherapy Indicated (CrCl ? 50 mL/min and Minimal or No Symptoms) AJCC M Category: M0 AJCC N Category: N0 AJCC T Category: T2 Current evidence of distant metastases<= No AJCC 8 Stage Grouping: II Intent of Therapy: Curative Intent, Discussed with Patient 

## 2017-04-20 NOTE — Progress Notes (Signed)
Reason for Referral: Bladder cancer.   HPI: 76 year old gentleman currently of [place] where he lived the majority of his life. He is a gentleman with history of recurrent superficial bladder tumor dating back to 2016. He had repeat TURBT and intravesicular BCG treatments in the past. His most recent cystoscopy in August 2018 performed by Dr. [name] showed a high-grade urothelial carcinoma with muscle invasion. CT scan of the chest abdomen and pelvis obtained on 04/13/2017 showed no evidence of metastatic disease. It showed a local recurrence in the bladder with diffuse wall thickening of the bladder with marked asymmetry in the left side. There is a centrally necrotic-appearing focal mass with no lymphadenopathy. Patient tolerated the procedure well however started developing recurrent symptoms of difficulty urination and dysuria. Indwelling Foley catheter was placed for the last few weeks. Otherwise he is asymptomatic from this cancer. He does not report any abdominal pain, flank pain or hematuria. He remains active and play golf twice in last week. He is able to drive and performs activities of daily living.   He does not report any headaches, blurry vision, syncope or seizures. He does not report any fevers, chills or sweats. He does not report any cough, wheezing or hemoptysis. He does not report any chest pain, palpitation, orthopnea or leg edema. He does not report any nausea, vomiting, abdominal pain, constipation, diarrhea, hematochezia or melena. He does not report any frequency urgency or hesitancy. He does report urinary retention and has a Foley catheter. He does not report any skeletal complaints. He does not report any petechia or rash. Remaining review of systems unremarkable.   Past Medical History:  Diagnosis Date  . BPH (benign prostatic hypertrophy)   . Dysuria   . GERD (gastroesophageal reflux disease)   . History of adenomatous polyp of colon    tubular adenoma 06/ 2018  .  History of bladder cancer 12/2014   urologist-  dr [name]--  dx High Grade TCC without stromal invasion s/p TURBT and intravesical BCG tx/  recurrent 07/2015 (pTa) TCC  repear intravesical BCG tx   . History of hiatal hernia   . History of urethral stricture   . Nocturia   :  Past Surgical History:  Procedure Laterality Date  . CARDIAC CATHETERIZATION  1997   normal coronary arteries  . CARDIOVASCULAR STRESS TEST  12-14-2005   Low risk perfusion study/  very small scar in the inferior septum from mid ventricle to apex,  no ischemia/  mild inferior septal hypokinesis/  ef 58%  . CATARACT EXTRACTION W/ INTRAOCULAR LENS  IMPLANT, BILATERAL  2011  . COLONOSCOPY  last one 06/ 2018  . CYSTOSCOPY WITH BIOPSY N/A 08/12/2015   Procedure: CYSTOSCOPY WITH BIOPSY AND FULGERATION;  Surgeon: [name], MD;  Location: [facility];  Service: Urology;  Laterality: N/A;  . CYSTOSCOPY WITH URETHRAL DILATATION N/A 03/21/2017   Procedure: CYSTOSCOPY;  Surgeon: [name], MD;  Location: [facility];  Service: Urology;  Laterality: N/A;  . ESOPHAGOGASTRODUODENOSCOPY  12-05-2014  . TRANSTHORACIC ECHOCARDIOGRAM  01-31-2008   nomral LVF,  ef 55-65%/  mild pulmonic stenosis without regurg.,  peak transpulomonic valve gradient 63mmHg  . TRANSURETHRAL RESECTION OF BLADDER TUMOR N/A 12/31/2014   Procedure: TRANSURETHRAL RESECTION OF BLADDER TUMOR (TURBT);  Surgeon: Lowella Bandy, MD;  Location: [facility];  Service: Urology;  Laterality: N/A;  . TRANSURETHRAL RESECTION OF BLADDER TUMOR N/A 03/21/2017   Procedure: POSSIBLE TRANSURETHRAL RESECTION OF BLADDER TUMOR (TURBT);  Surgeon:  [name], MD;  Location: [facility];  Service: Urology;  Laterality: N/A;  :   Current Outpatient Prescriptions:  .  aspirin EC 81 MG tablet, Take 81 mg by mouth daily., Disp: , Rfl:  .  HYDROcodone-acetaminophen (NORCO) 5-325 MG tablet, Take 1 tablet by  mouth every 4 (four) hours as needed for moderate pain., Disp: 30 tablet, Rfl: 0 .  ibuprofen (ADVIL,MOTRIN) 200 MG tablet, Take 200 mg by mouth every 6 (six) hours as needed., Disp: , Rfl:  .  tamsulosin (FLOMAX) 0.4 MG CAPS capsule, Take 0.4 mg by mouth every morning. , Disp: , Rfl:  .  prochlorperazine (COMPAZINE) 10 MG tablet, Take 1 tablet (10 mg total) by mouth every 6 (six) hours as needed for nausea or vomiting., Disp: 30 tablet, Rfl: 0:  Allergies  Allergen Reactions  . Ciprofloxacin Hives  :  Family History  Problem Relation Age of Onset  . Colon cancer Neg Hx   . Colon polyps Neg Hx   . Diabetes Neg Hx   . Kidney disease Neg Hx   . Esophageal cancer Neg Hx   . Heart disease Neg Hx   . Gallbladder disease Neg Hx   :  Social History   Social History  . Marital status: Married    Spouse name: N/A  . Number of children: 1  . Years of education: N/A   Occupational History  . Retired    Social History Main Topics  . Smoking status: Former Smoker    Packs/day: 1.00    Years: 34.00    Types: Cigarettes    Quit date: 12/26/1988  . Smokeless tobacco: Never Used  . Alcohol use 3.6 oz/week    6 Cans of beer per week     Comment: BEER  . Drug use: No  . Sexual activity: Not on file   Other Topics Concern  . Not on file   Social History Narrative  . No narrative on file  :  Pertinent items are noted in HPI.  Exam: Blood pressure 140/69, pulse 75, temperature 97.7 F (36.5 C), temperature source Oral, resp. rate 18, height 5\' 11"  (1.803 m), weight 192 lb 11.2 oz (87.4 kg), SpO2 98 %. General appearance: alert and cooperative Throat: lips, mucosa, and tongue normal; teeth and gums normal Neck: no adenopathy Back: negative Resp: clear to auscultation bilaterally Cardio: regular rate and rhythm, S1, S2 normal, no murmur, click, rub or gallop GI: soft, non-tender; bowel sounds normal; no masses,  no organomegaly Extremities: extremities normal, atraumatic, no  cyanosis or edema Skin: Skin color, texture, turgor normal. No rashes or lesions  CBC    Component Value Date/Time   WBC 6.5 08/07/2015 1100   RBC 4.64 08/07/2015 1100   HGB 17.5 (H) 03/21/2017 1037   HCT 47.8 08/07/2015 1100   PLT 193 08/07/2015 1100   MCV 103.0 (H) 08/07/2015 1100   MCH 35.8 (H) 08/07/2015 1100   MCHC 34.7 08/07/2015 1100   RDW 12.9 08/07/2015 1100     Chemistry      Component Value Date/Time   NA 142 08/07/2015 1100   K 4.6 08/07/2015 1100   CL 106 08/07/2015 1100   CO2 30 08/07/2015 1100   BUN 9 08/07/2015 1100   CREATININE 1.11 08/07/2015 1100      Component Value Date/Time   CALCIUM 8.6 (L) 08/07/2015 1100       Assessment and Plan:   76 year old gentleman with the following issues:  1.  High-grade urothelial carcinoma arising from the bladder with muscle invasion diagnosed in August 2018. He has a history of recurrent superficial bladder tumors treated with TURBT and BCG in the past. Staging CT scan obtained on 04/13/2017 showed no evidence of metastatic disease indicating at least T2 N0 disease.  The natural course of this disease was discussed today with the patient, his wife and his daughter. He appears to have more aggressive malignancy with focal necrosis and although he does not have metastatic disease he is at risk of developing aggressive malignancy that could be incurable if left untreated.  Curative options were discussed today which includes primary surgical therapy after neoadjuvant systemic chemotherapy. The rationale for using this therapy was discussed today including the rationale for using neoadjuvant chemotherapy. Risks and benefits associated with cisplatin and gemcitabine chemotherapy were reviewed. Complications include nausea, vomiting, myelosuppression, neutropenia, neutropenic sepsis, peripheral neuropathy, hearing loss, renal toxicity among others. Rarely severe morbidity, hospitalization-can occur from this  treatment.  Alternatively, if he elects against surgery then radiation with chemotherapy would be an alternative. He appears to be a good surgical candidate given his reasonably good health and excellent performance status.  After discussion today, he elected to proceed with neoadjuvant chemotherapy in preparation for primary surgical therapy. He will receive 4 cycles of chemotherapy and likely conclude around January 2019. He will attended chemotherapy education class and immediate future.  2. IV access: Risks and benefits of Port-A-Cath insertion were reviewed today and he elected to proceed with peripheral veins. This can be revisited in the future if his IV access becomes an issue.  3. Nausea prophylaxis: Prescription for Compazine was given to the patient.  4. Renal function surveillance: His creatinine will be monitored closely and we have discussed avoiding nephrotoxic drugs including nonsteroidal anti-inflammatories.  5. Goals of care: The goal of therapy is curative at this time in his performance status and quality of life remains intact and aggressive therapy is warranted.  6. Follow-up: Will be in the immediate future to start therapy.

## 2017-04-25 ENCOUNTER — Other Ambulatory Visit: Payer: Medicare Other

## 2017-04-27 DIAGNOSIS — C678 Malignant neoplasm of overlapping sites of bladder: Secondary | ICD-10-CM | POA: Diagnosis not present

## 2017-04-27 DIAGNOSIS — R339 Retention of urine, unspecified: Secondary | ICD-10-CM | POA: Diagnosis not present

## 2017-05-02 ENCOUNTER — Ambulatory Visit (HOSPITAL_BASED_OUTPATIENT_CLINIC_OR_DEPARTMENT_OTHER): Payer: Medicare Other

## 2017-05-02 ENCOUNTER — Other Ambulatory Visit (HOSPITAL_BASED_OUTPATIENT_CLINIC_OR_DEPARTMENT_OTHER): Payer: Medicare Other

## 2017-05-02 VITALS — BP 146/82 | HR 95 | Temp 97.7°F | Resp 18

## 2017-05-02 DIAGNOSIS — C679 Malignant neoplasm of bladder, unspecified: Secondary | ICD-10-CM | POA: Diagnosis not present

## 2017-05-02 DIAGNOSIS — Z5111 Encounter for antineoplastic chemotherapy: Secondary | ICD-10-CM

## 2017-05-02 LAB — COMPREHENSIVE METABOLIC PANEL
ALT: 15 U/L (ref 0–55)
AST: 12 U/L (ref 5–34)
Albumin: 2.7 g/dL — ABNORMAL LOW (ref 3.5–5.0)
Alkaline Phosphatase: 71 U/L (ref 40–150)
Anion Gap: 9 mEq/L (ref 3–11)
BUN: 9.4 mg/dL (ref 7.0–26.0)
CO2: 24 mEq/L (ref 22–29)
Calcium: 9.4 mg/dL (ref 8.4–10.4)
Chloride: 106 mEq/L (ref 98–109)
Creatinine: 0.8 mg/dL (ref 0.7–1.3)
EGFR: 86 mL/min/{1.73_m2} — ABNORMAL LOW (ref >90)
Glucose: 111 mg/dl (ref 70–140)
Potassium: 3.6 mEq/L (ref 3.5–5.1)
Sodium: 139 mEq/L (ref 136–145)
Total Bilirubin: 0.74 mg/dL (ref 0.20–1.20)
Total Protein: 5.5 g/dL — ABNORMAL LOW (ref 6.4–8.3)

## 2017-05-02 LAB — CBC WITH DIFFERENTIAL/PLATELET
BASO%: 0.5 % (ref 0.0–2.0)
Basophils Absolute: 0 10*3/uL (ref 0.0–0.1)
EOS%: 2 % (ref 0.0–7.0)
Eosinophils Absolute: 0.2 10*3/uL (ref 0.0–0.5)
HCT: 42.5 % (ref 38.4–49.9)
HGB: 14.7 g/dL (ref 13.0–17.1)
LYMPH%: 12 % — ABNORMAL LOW (ref 14.0–49.0)
MCH: 35.6 pg — ABNORMAL HIGH (ref 27.2–33.4)
MCHC: 34.7 g/dL (ref 32.0–36.0)
MCV: 102.5 fL — ABNORMAL HIGH (ref 79.3–98.0)
MONO#: 0.7 10*3/uL (ref 0.1–0.9)
MONO%: 7.7 % (ref 0.0–14.0)
NEUT#: 6.8 10*3/uL — ABNORMAL HIGH (ref 1.5–6.5)
NEUT%: 77.8 % — ABNORMAL HIGH (ref 39.0–75.0)
Platelets: 194 10*3/uL (ref 140–400)
RBC: 4.15 10*6/uL — ABNORMAL LOW (ref 4.20–5.82)
RDW: 13.1 % (ref 11.0–14.6)
WBC: 8.7 10*3/uL (ref 4.0–10.3)
lymph#: 1 10*3/uL (ref 0.9–3.3)

## 2017-05-02 MED ORDER — SODIUM CHLORIDE 0.9 % IV SOLN
Freq: Once | INTRAVENOUS | Status: AC
  Administered 2017-05-02: 12:00:00 via INTRAVENOUS
  Filled 2017-05-02: qty 5

## 2017-05-02 MED ORDER — SODIUM CHLORIDE 0.9 % IV SOLN
48.0000 mg/m2 | Freq: Once | INTRAVENOUS | Status: AC
  Administered 2017-05-02: 100 mg via INTRAVENOUS
  Filled 2017-05-02: qty 100

## 2017-05-02 MED ORDER — SODIUM CHLORIDE 0.9 % IV SOLN
Freq: Once | INTRAVENOUS | Status: AC
  Administered 2017-05-02: 09:00:00 via INTRAVENOUS

## 2017-05-02 MED ORDER — POTASSIUM CHLORIDE 2 MEQ/ML IV SOLN
Freq: Once | INTRAVENOUS | Status: AC
  Administered 2017-05-02: 09:00:00 via INTRAVENOUS
  Filled 2017-05-02: qty 10

## 2017-05-02 MED ORDER — PALONOSETRON HCL INJECTION 0.25 MG/5ML
0.2500 mg | Freq: Once | INTRAVENOUS | Status: AC
Start: 2017-05-02 — End: 2017-05-02
  Administered 2017-05-02: 0.25 mg via INTRAVENOUS

## 2017-05-02 MED ORDER — GEMCITABINE HCL CHEMO INJECTION 1 GM/26.3ML
1000.0000 mg/m2 | Freq: Once | INTRAVENOUS | Status: AC
  Administered 2017-05-02: 2090 mg via INTRAVENOUS
  Filled 2017-05-02: qty 54.97

## 2017-05-02 MED ORDER — PALONOSETRON HCL INJECTION 0.25 MG/5ML
INTRAVENOUS | Status: AC
  Filled 2017-05-02: qty 5

## 2017-05-02 NOTE — Patient Instructions (Signed)
[facility] Discharge Instructions for Patients Receiving Chemotherapy  Today you received the following chemotherapy agents: Gemzar and Cistplatin.  To help prevent nausea and vomiting after your treatment, we encourage you to take your nausea medication as prescribed by your doctor.   If you develop nausea and vomiting that is not controlled by your nausea medication, call the clinic.   BELOW ARE SYMPTOMS THAT SHOULD BE REPORTED IMMEDIATELY:  *FEVER GREATER THAN 100.5 F  *CHILLS WITH OR WITHOUT FEVER  NAUSEA AND VOMITING THAT IS NOT CONTROLLED WITH YOUR NAUSEA MEDICATION  *UNUSUAL SHORTNESS OF BREATH  *UNUSUAL BRUISING OR BLEEDING  TENDERNESS IN MOUTH AND THROAT WITH OR WITHOUT PRESENCE OF ULCERS  *URINARY PROBLEMS  *BOWEL PROBLEMS  UNUSUAL RASH Items with * indicate a potential emergency and should be followed up as soon as possible.  Feel free to call the clinic you have any questions or concerns. The clinic phone number is [phone].  Please show the [facility] at check-in to the Emergency Department and triage nurse.  Gemcitabine (Gemzar) injection What is this medicine? GEMCITABINE (jem SIT a been) is a chemotherapy drug. This medicine is used to treat many types of cancer like breast cancer, lung cancer, pancreatic cancer, and ovarian cancer. This medicine may be used for other purposes; ask your health care provider or pharmacist if you have questions. COMMON BRAND NAME(S): Gemzar What should I tell my health care provider before I take this medicine? They need to know if you have any of these conditions: -blood disorders -infection -kidney disease -liver disease -recent or ongoing radiation therapy -an unusual or allergic reaction to gemcitabine, other chemotherapy, other medicines, foods, dyes, or preservatives -pregnant or trying to get pregnant -breast-feeding How should I use this medicine? This drug is given as an infusion into  a vein. It is administered in a hospital or clinic by a specially trained health care professional. Talk to your pediatrician regarding the use of this medicine in children. Special care may be needed. Overdosage: If you think you have taken too much of this medicine contact a poison control center or emergency room at once. NOTE: This medicine is only for you. Do not share this medicine with others. What if I miss a dose? It is important not to miss your dose. Call your doctor or health care professional if you are unable to keep an appointment. What may interact with this medicine? -medicines to increase blood counts like filgrastim, pegfilgrastim, sargramostim -some other chemotherapy drugs like cisplatin -vaccines Talk to your doctor or health care professional before taking any of these medicines: -acetaminophen -aspirin -ibuprofen -ketoprofen -naproxen This list may not describe all possible interactions. Give your health care provider a list of all the medicines, herbs, non-prescription drugs, or dietary supplements you use. Also tell them if you smoke, drink alcohol, or use illegal drugs. Some items may interact with your medicine. What should I watch for while using this medicine? Visit your doctor for checks on your progress. This drug may make you feel generally unwell. This is not uncommon, as chemotherapy can affect healthy cells as well as cancer cells. Report any side effects. Continue your course of treatment even though you feel ill unless your doctor tells you to stop. In some cases, you may be given additional medicines to help with side effects. Follow all directions for their use. Call your doctor or health care professional for advice if you get a fever, chills or sore throat, or other  symptoms of a cold or flu. Do not treat yourself. This drug decreases your body's ability to fight infections. Try to avoid being around people who are sick. This medicine may increase your  risk to bruise or bleed. Call your doctor or health care professional if you notice any unusual bleeding. Be careful brushing and flossing your teeth or using a toothpick because you may get an infection or bleed more easily. If you have any dental work done, tell your dentist you are receiving this medicine. Avoid taking products that contain aspirin, acetaminophen, ibuprofen, naproxen, or ketoprofen unless instructed by your doctor. These medicines may hide a fever. Women should inform their doctor if they wish to become pregnant or think they might be pregnant. There is a potential for serious side effects to an unborn child. Talk to your health care professional or pharmacist for more information. Do not breast-feed an infant while taking this medicine. What side effects may I notice from receiving this medicine? Side effects that you should report to your doctor or health care professional as soon as possible: -allergic reactions like skin rash, itching or hives, swelling of the face, lips, or tongue -low blood counts - this medicine may decrease the number of white blood cells, red blood cells and platelets. You may be at increased risk for infections and bleeding. -signs of infection - fever or chills, cough, sore throat, pain or difficulty passing urine -signs of decreased platelets or bleeding - bruising, pinpoint red spots on the skin, black, tarry stools, blood in the urine -signs of decreased red blood cells - unusually weak or tired, fainting spells, lightheadedness -breathing problems -chest pain -mouth sores -nausea and vomiting -pain, swelling, redness at site where injected -pain, tingling, numbness in the hands or feet -stomach pain -swelling of ankles, feet, hands -unusual bleeding Side effects that usually do not require medical attention (report to your doctor or health care professional if they continue or are bothersome): -constipation -diarrhea -hair loss -loss of  appetite -stomach upset This list may not describe all possible side effects. Call your doctor for medical advice about side effects. You may report side effects to FDA at 1-800-FDA-1088. Where should I keep my medicine? This drug is given in a hospital or clinic and will not be stored at home. NOTE: This sheet is a summary. It may not cover all possible information. If you have questions about this medicine, talk to your doctor, pharmacist, or health care provider.  2018 Elsevier/Gold Standard (2007-12-12 18:45:54)  Cisplatin injection What is this medicine? CISPLATIN (SIS pla tin) is a chemotherapy drug. It targets fast dividing cells, like cancer cells, and causes these cells to die. This medicine is used to treat many types of cancer like bladder, ovarian, and testicular cancers. This medicine may be used for other purposes; ask your health care provider or pharmacist if you have questions. COMMON BRAND NAME(S): Platinol, Platinol -AQ What should I tell my health care provider before I take this medicine? They need to know if you have any of these conditions: -blood disorders -hearing problems -kidney disease -recent or ongoing radiation therapy -an unusual or allergic reaction to cisplatin, carboplatin, other chemotherapy, other medicines, foods, dyes, or preservatives -pregnant or trying to get pregnant -breast-feeding How should I use this medicine? This drug is given as an infusion into a vein. It is administered in a hospital or clinic by a specially trained health care professional. Talk to your pediatrician regarding the use of this medicine in  children. Special care may be needed. Overdosage: If you think you have taken too much of this medicine contact a poison control center or emergency room at once. NOTE: This medicine is only for you. Do not share this medicine with others. What if I miss a dose? It is important not to miss a dose. Call your doctor or health care  professional if you are unable to keep an appointment. What may interact with this medicine? -dofetilide -foscarnet -medicines for seizures -medicines to increase blood counts like filgrastim, pegfilgrastim, sargramostim -probenecid -pyridoxine used with altretamine -rituximab -some antibiotics like amikacin, gentamicin, neomycin, polymyxin B, streptomycin, tobramycin -sulfinpyrazone -vaccines -zalcitabine Talk to your doctor or health care professional before taking any of these medicines: -acetaminophen -aspirin -ibuprofen -ketoprofen -naproxen This list may not describe all possible interactions. Give your health care provider a list of all the medicines, herbs, non-prescription drugs, or dietary supplements you use. Also tell them if you smoke, drink alcohol, or use illegal drugs. Some items may interact with your medicine. What should I watch for while using this medicine? Your condition will be monitored carefully while you are receiving this medicine. You will need important blood work done while you are taking this medicine. This drug may make you feel generally unwell. This is not uncommon, as chemotherapy can affect healthy cells as well as cancer cells. Report any side effects. Continue your course of treatment even though you feel ill unless your doctor tells you to stop. In some cases, you may be given additional medicines to help with side effects. Follow all directions for their use. Call your doctor or health care professional for advice if you get a fever, chills or sore throat, or other symptoms of a cold or flu. Do not treat yourself. This drug decreases your body's ability to fight infections. Try to avoid being around people who are sick. This medicine may increase your risk to bruise or bleed. Call your doctor or health care professional if you notice any unusual bleeding. Be careful brushing and flossing your teeth or using a toothpick because you may get an infection  or bleed more easily. If you have any dental work done, tell your dentist you are receiving this medicine. Avoid taking products that contain aspirin, acetaminophen, ibuprofen, naproxen, or ketoprofen unless instructed by your doctor. These medicines may hide a fever. Do not become pregnant while taking this medicine. Women should inform their doctor if they wish to become pregnant or think they might be pregnant. There is a potential for serious side effects to an unborn child. Talk to your health care professional or pharmacist for more information. Do not breast-feed an infant while taking this medicine. Drink fluids as directed while you are taking this medicine. This will help protect your kidneys. Call your doctor or health care professional if you get diarrhea. Do not treat yourself. What side effects may I notice from receiving this medicine? Side effects that you should report to your doctor or health care professional as soon as possible: -allergic reactions like skin rash, itching or hives, swelling of the face, lips, or tongue -signs of infection - fever or chills, cough, sore throat, pain or difficulty passing urine -signs of decreased platelets or bleeding - bruising, pinpoint red spots on the skin, black, tarry stools, nosebleeds -signs of decreased red blood cells - unusually weak or tired, fainting spells, lightheadedness -breathing problems -changes in hearing -gout pain -low blood counts - This drug may decrease the number of white  blood cells, red blood cells and platelets. You may be at increased risk for infections and bleeding. -nausea and vomiting -pain, swelling, redness or irritation at the injection site -pain, tingling, numbness in the hands or feet -problems with balance, movement -trouble passing urine or change in the amount of urine Side effects that usually do not require medical attention (report to your doctor or health care professional if they continue or are  bothersome): -changes in vision -loss of appetite -metallic taste in the mouth or changes in taste This list may not describe all possible side effects. Call your doctor for medical advice about side effects. You may report side effects to FDA at 1-800-FDA-1088. Where should I keep my medicine? This drug is given in a hospital or clinic and will not be stored at home. NOTE: This sheet is a summary. It may not cover all possible information. If you have questions about this medicine, talk to your doctor, pharmacist, or health care provider.  2018 Elsevier/Gold Standard (2007-11-07 14:40:54)

## 2017-05-03 ENCOUNTER — Telehealth: Payer: Self-pay | Admitting: *Deleted

## 2017-05-03 NOTE — Telephone Encounter (Signed)
Spoke with patient for follow up for first time chemotherapy. Patient stated,"I'm doing fine. I slept most of today and feel a little weak, but nothing else."

## 2017-05-03 NOTE — Telephone Encounter (Signed)
-----   Message from Ma Hillock, RN sent at 05/02/2017  3:05 PM EDT ----- Regarding: Shadad: 1st Chemo F/U Patient received first dose of cisplatin/gemzar. Patient tolerated the treatment without any complications. Patient education done for s/s of infection, nausea, etc.

## 2017-05-09 ENCOUNTER — Ambulatory Visit: Payer: Medicare Other

## 2017-05-09 ENCOUNTER — Ambulatory Visit (HOSPITAL_BASED_OUTPATIENT_CLINIC_OR_DEPARTMENT_OTHER): Payer: Medicare Other | Admitting: Oncology

## 2017-05-09 ENCOUNTER — Other Ambulatory Visit (HOSPITAL_BASED_OUTPATIENT_CLINIC_OR_DEPARTMENT_OTHER): Payer: Medicare Other

## 2017-05-09 VITALS — BP 146/78 | HR 116 | Temp 98.3°F | Resp 17 | Ht 71.0 in | Wt 187.8 lb

## 2017-05-09 DIAGNOSIS — K5900 Constipation, unspecified: Secondary | ICD-10-CM | POA: Diagnosis not present

## 2017-05-09 DIAGNOSIS — C679 Malignant neoplasm of bladder, unspecified: Secondary | ICD-10-CM

## 2017-05-09 DIAGNOSIS — R300 Dysuria: Secondary | ICD-10-CM | POA: Diagnosis not present

## 2017-05-09 DIAGNOSIS — R509 Fever, unspecified: Secondary | ICD-10-CM | POA: Diagnosis not present

## 2017-05-09 LAB — COMPREHENSIVE METABOLIC PANEL
ALT: 21 U/L (ref 0–55)
AST: 12 U/L (ref 5–34)
Albumin: 2.6 g/dL — ABNORMAL LOW (ref 3.5–5.0)
Alkaline Phosphatase: 68 U/L (ref 40–150)
Anion Gap: 9 mEq/L (ref 3–11)
BUN: 14.2 mg/dL (ref 7.0–26.0)
CO2: 26 mEq/L (ref 22–29)
Calcium: 9.1 mg/dL (ref 8.4–10.4)
Chloride: 102 mEq/L (ref 98–109)
Creatinine: 0.9 mg/dL (ref 0.7–1.3)
EGFR: 85 mL/min/{1.73_m2} — ABNORMAL LOW (ref >90)
Glucose: 120 mg/dl (ref 70–140)
Potassium: 3.6 mEq/L (ref 3.5–5.1)
Sodium: 137 mEq/L (ref 136–145)
Total Bilirubin: 0.94 mg/dL (ref 0.20–1.20)
Total Protein: 5.8 g/dL — ABNORMAL LOW (ref 6.4–8.3)

## 2017-05-09 LAB — CBC WITH DIFFERENTIAL/PLATELET
BASO%: 0.3 % (ref 0.0–2.0)
Basophils Absolute: 0 10*3/uL (ref 0.0–0.1)
EOS%: 0.4 % (ref 0.0–7.0)
Eosinophils Absolute: 0 10*3/uL (ref 0.0–0.5)
HCT: 38.2 % — ABNORMAL LOW (ref 38.4–49.9)
HGB: 13.5 g/dL (ref 13.0–17.1)
LYMPH%: 7.3 % — ABNORMAL LOW (ref 14.0–49.0)
MCH: 35.3 pg — ABNORMAL HIGH (ref 27.2–33.4)
MCHC: 35.3 g/dL (ref 32.0–36.0)
MCV: 100 fL — ABNORMAL HIGH (ref 79.3–98.0)
MONO#: 0.8 10*3/uL (ref 0.1–0.9)
MONO%: 11.2 % (ref 0.0–14.0)
NEUT#: 5.6 10*3/uL (ref 1.5–6.5)
NEUT%: 80.8 % — ABNORMAL HIGH (ref 39.0–75.0)
Platelets: 123 10*3/uL — ABNORMAL LOW (ref 140–400)
RBC: 3.82 10*6/uL — ABNORMAL LOW (ref 4.20–5.82)
RDW: 12.4 % (ref 11.0–14.6)
WBC: 7 10*3/uL (ref 4.0–10.3)
lymph#: 0.5 10*3/uL — ABNORMAL LOW (ref 0.9–3.3)

## 2017-05-09 MED ORDER — CIPROFLOXACIN HCL 500 MG PO TABS: 500.0000 mg | ORAL_TABLET | Freq: Two times a day (BID) | ORAL | 0 refills | Status: DC

## 2017-05-09 NOTE — Progress Notes (Signed)
Hematology and Oncology Follow Up Visit  [name] [id number] 07-09-1941 76 y.o. 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 Staging CT scan obtained on 04/13/2017 showed no evidence of metastatic disease indicating at least T2 N0 disease.   Prior Therapy: He is status post TURBT done on 03/21/2017.  Current therapy: Neoadjuvant chemotherapy utilizing gemcitabine and cisplatin. Day 1 of cycle 1 was given on 05/02/2017.  Interim History: [name] presents today for a follow-up visit. Since the last visit, he received day 1 of cycle 1 of chemotherapy and tolerated it well. He denied any nausea, vomiting or infusion related complications. He did report some mild fatigue afterwards. He felt reasonably fair up until the last 24 hours. He reported low-grade fever and dysuria. He is constipated and complaining with a lot of discomfort associated with his Foley catheter. He feels generally weak although he is eating and drinking properly. He denied any neuropathy or neurological deficits.  He does not report any headaches, blurry vision, syncope or seizures. He does not report any fevers, chills or sweats. He does not report any cough, wheezing or hemoptysis. He does not report any chest pain, palpitation, orthopnea or leg edema. He does not report any nausea, vomiting, abdominal pain, constipation, diarrhea, hematochezia or melena. He does not report any frequency urgency or hesitancy. He does report urinary retention and has a Foley catheter. He does not report any skeletal complaints. He does not report any petechia or rash. Remaining review of systems unremarkable.   Medications: I have reviewed the patient's current medications.  Current Outpatient Prescriptions  Medication Sig Dispense Refill  . aspirin EC 81 MG tablet Take 81 mg  by mouth daily.    Marland Kitchen HYDROcodone-acetaminophen (NORCO) 5-325 MG tablet Take 1 tablet by mouth every 4 (four) hours as needed for moderate pain. 30 tablet 0  . ibuprofen (ADVIL,MOTRIN) 200 MG tablet Take 200 mg by mouth every 6 (six) hours as needed.    . prochlorperazine (COMPAZINE) 10 MG tablet Take 1 tablet (10 mg total) by mouth every 6 (six) hours as needed for nausea or vomiting. 30 tablet 0  . tamsulosin (FLOMAX) 0.4 MG CAPS capsule Take 0.4 mg by mouth every morning.     . ciprofloxacin (CIPRO) 500 MG tablet Take 1 tablet (500 mg total) by mouth 2 (two) times daily. 14 tablet 0   No current facility-administered medications for this visit.      Allergies:  Allergies  Allergen Reactions  . Ciprofloxacin Hives    Past Medical History, Surgical history, Social history, and Family History were reviewed and updated.  Physical Exam: Blood pressure (!) 146/78, pulse (!) 116, temperature 98.3 F (36.8 C), temperature source Oral, resp. rate 17, height 5\' 11"  (1.803 m), weight 187 lb 12.8 oz (85.2 kg), SpO2 98 %. ECOG: 1 General appearance: alert and cooperative Head: Normocephalic, without obvious abnormality Neck: no adenopathy Lymph nodes: Cervical, supraclavicular, and axillary nodes normal. Heart:regular rate and rhythm, S1, S2 normal, no murmur, click, rub or gallop Lung:chest clear, no wheezing, rales, normal symmetric air entry Abdomin: soft, non-tender, without masses or organomegaly EXT:no erythema, induration, or nodules   Lab Results: Lab Results  Component Value Date   WBC 7.0 05/09/2017   HGB 13.5 05/09/2017   HCT 38.2 (L) 05/09/2017   MCV 100.0 (H) 05/09/2017   PLT 123 (L) 05/09/2017  Chemistry      Component Value Date/Time   NA 139 05/02/2017 0757   K 3.6 05/02/2017 0757   CL 106 08/07/2015 1100   CO2 24 05/02/2017 0757   BUN 9.4 05/02/2017 0757   CREATININE 0.8 05/02/2017 0757      Component Value Date/Time   CALCIUM 9.4 05/02/2017 0757    ALKPHOS 71 05/02/2017 0757   AST 12 05/02/2017 0757   ALT 15 05/02/2017 0757   BILITOT 0.74 05/02/2017 0757       Impression and Plan:  76 year old gentleman with the following issues:  1. High-grade urothelial carcinoma arising from the bladder with muscle invasion diagnosed in August 2018. He has a history of recurrent superficial bladder tumors treated with TURBT and BCG in the past. Staging CT scan obtained on 04/13/2017 showed no evidence of metastatic disease indicating at least T2 N0 disease.  He received a day 1 of cycle 1 of chemotherapy on 05/02/2017. He tolerated it well without any complications. He is feeling poorly overall and has requested to suspend chemotherapy and would like to proceed with surgery as soon as possible. He believes that chemotherapy has been tolerable but he cannot wait given his overall discomfort related to his catheter and his bladder tumor.  For this reason, I will suspend chemotherapy and refer him to urology for evaluation for cystectomy in the immediate future.  2. Low-grade fever and dysuria: He has a Foley catheter in place. He appears to have a possible urinary tract infection. We'll prescribe ciprofloxacin for the next 7 days and attempt to improve his symptoms.  3. Constipation: Related to taking pain medication because of his bladder tumor. I certainly to take stool softeners on a daily basis and he will use magnesium citrate effective immediately.  4. Renal function surveillance: His creatinine continues to be normal.  5. Follow-up: Be in 2 weeks to follow his progress.   Zola Button, MD 9/24/20188:53 AM

## 2017-05-12 DIAGNOSIS — R339 Retention of urine, unspecified: Secondary | ICD-10-CM | POA: Diagnosis not present

## 2017-05-13 ENCOUNTER — Telehealth: Payer: Self-pay | Admitting: *Deleted

## 2017-05-13 NOTE — Telephone Encounter (Signed)
Patient calling to request a script for gout. Referred him to his primary care physician.

## 2017-05-16 DIAGNOSIS — C678 Malignant neoplasm of overlapping sites of bladder: Secondary | ICD-10-CM | POA: Diagnosis not present

## 2017-05-23 ENCOUNTER — Ambulatory Visit (HOSPITAL_BASED_OUTPATIENT_CLINIC_OR_DEPARTMENT_OTHER): Payer: Medicare Other | Admitting: Nurse Practitioner

## 2017-05-23 ENCOUNTER — Other Ambulatory Visit (HOSPITAL_BASED_OUTPATIENT_CLINIC_OR_DEPARTMENT_OTHER): Payer: Medicare Other

## 2017-05-23 ENCOUNTER — Telehealth: Payer: Self-pay | Admitting: Nurse Practitioner

## 2017-05-23 ENCOUNTER — Ambulatory Visit: Payer: Medicare Other

## 2017-05-23 VITALS — BP 131/75 | HR 90 | Temp 97.8°F | Resp 20 | Ht 71.0 in | Wt 191.4 lb

## 2017-05-23 DIAGNOSIS — Z23 Encounter for immunization: Secondary | ICD-10-CM

## 2017-05-23 DIAGNOSIS — C679 Malignant neoplasm of bladder, unspecified: Secondary | ICD-10-CM | POA: Diagnosis not present

## 2017-05-23 LAB — CBC WITH DIFFERENTIAL/PLATELET
BASO%: 0.8 % (ref 0.0–2.0)
Basophils Absolute: 0 10*3/uL (ref 0.0–0.1)
EOS%: 1.8 % (ref 0.0–7.0)
Eosinophils Absolute: 0.1 10*3/uL (ref 0.0–0.5)
HCT: 37.3 % — ABNORMAL LOW (ref 38.4–49.9)
HGB: 12.7 g/dL — ABNORMAL LOW (ref 13.0–17.1)
LYMPH%: 12.4 % — ABNORMAL LOW (ref 14.0–49.0)
MCH: 35.6 pg — ABNORMAL HIGH (ref 27.2–33.4)
MCHC: 34.2 g/dL (ref 32.0–36.0)
MCV: 104.2 fL — ABNORMAL HIGH (ref 79.3–98.0)
MONO#: 1 10*3/uL — ABNORMAL HIGH (ref 0.1–0.9)
MONO%: 16.8 % — ABNORMAL HIGH (ref 0.0–14.0)
NEUT#: 4.2 10*3/uL (ref 1.5–6.5)
NEUT%: 68.2 % (ref 39.0–75.0)
Platelets: 298 10*3/uL (ref 140–400)
RBC: 3.58 10*6/uL — ABNORMAL LOW (ref 4.20–5.82)
RDW: 13.6 % (ref 11.0–14.6)
WBC: 6.2 10*3/uL (ref 4.0–10.3)
lymph#: 0.8 10*3/uL — ABNORMAL LOW (ref 0.9–3.3)

## 2017-05-23 LAB — COMPREHENSIVE METABOLIC PANEL
ALT: 15 U/L (ref 0–55)
AST: 13 U/L (ref 5–34)
Albumin: 2.5 g/dL — ABNORMAL LOW (ref 3.5–5.0)
Alkaline Phosphatase: 57 U/L (ref 40–150)
Anion Gap: 8 mEq/L (ref 3–11)
BUN: 12.5 mg/dL (ref 7.0–26.0)
CO2: 27 mEq/L (ref 22–29)
Calcium: 9 mg/dL (ref 8.4–10.4)
Chloride: 107 mEq/L (ref 98–109)
Creatinine: 0.8 mg/dL (ref 0.7–1.3)
EGFR: 86 mL/min/{1.73_m2} — ABNORMAL LOW (ref >90)
Glucose: 97 mg/dl (ref 70–140)
Potassium: 4 mEq/L (ref 3.5–5.1)
Sodium: 141 mEq/L (ref 136–145)
Total Bilirubin: 0.37 mg/dL (ref 0.20–1.20)
Total Protein: 5.6 g/dL — ABNORMAL LOW (ref 6.4–8.3)

## 2017-05-23 MED ORDER — INFLUENZA VAC SPLIT QUAD 0.5 ML IM SUSY
0.5000 mL | PREFILLED_SYRINGE | Freq: Once | INTRAMUSCULAR | Status: DC
  Filled 2017-05-23: qty 0.5

## 2017-05-23 MED ORDER — INFLUENZA VAC SPLIT HIGH-DOSE 0.5 ML IM SUSY
0.5000 mL | PREFILLED_SYRINGE | Freq: Once | INTRAMUSCULAR | Status: AC
  Administered 2017-05-23: 0.5 mL via INTRAMUSCULAR
  Filled 2017-05-23: qty 0.5

## 2017-05-23 NOTE — Telephone Encounter (Signed)
Left message for patient regarding appts that were scheduled per 10/8 los.

## 2017-05-23 NOTE — Progress Notes (Signed)
  [facility] OFFICE PROGRESS NOTE   Diagnosis:  High-grade urothelial carcinoma arising from the bladder with muscle invasion diagnosed in August 2018. Staging CT scan obtained on 04/13/2017 showed no evidence of metastatic disease indicating at least T2 N0 disease.  INTERVAL HISTORY:   [name] returns as scheduled. He overall is feeling better. He reports the Foley catheter was removed last week. He notes improvement in the discomfort. The urine stream is slow. He has frequency. He reports a good appetite. No nausea or vomiting. He has a bowel movement every other day. He takes a stool softener and magnesium citrate as needed. He is scheduled to see Dr. Tresa Moore to discuss surgery later this week.  Objective:  Vital signs in last 24 hours:  Blood pressure 131/75, pulse 90, temperature 97.8 F (36.6 C), temperature source Oral, resp. rate 20, height 5\' 11"  (1.803 m), weight 191 lb 6.4 oz (86.8 kg), SpO2 96 %.    HEENT: No thrush or ulcers. Resp: Lungs clear bilaterally. Cardio: Regular rate and rhythm. GI: Abdomen soft and nontender. No hepatomegaly. Vascular: No leg edema. Calves soft and nontender. Neuro: Alert and oriented.  Skin: Ecchymoses scattered over the forearms.    Lab Results:  Lab Results  Component Value Date   WBC 6.2 05/23/2017   HGB 12.7 (L) 05/23/2017   HCT 37.3 (L) 05/23/2017   MCV 104.2 (H) 05/23/2017   PLT 298 05/23/2017   NEUTROABS 4.2 05/23/2017    Imaging:  No results found.  Medications: I have reviewed the patient's current medications.  Assessment/Plan: 1. High-grade urothelial carcinoma arising from the bladder with muscle invasion diagnosed in August 2018. He has a history of recurrent superficial bladder tumors treated with TURBT and BCG in the past. Staging CT scan obtained on 04/13/2017 showed no evidence of metastatic disease indicating at least T2 N0 disease. He received day 1 of cycle 1 Cisplatin/gemcitabine on 05/02/2017.  He tolerated it well without any complications. He subsequently requested to suspend chemotherapy and to proceed with surgery as soon as possible due to overall discomfort related to the catheter and bladder tumor. He was referred back to urology.   Disposition: Mr. Nickerson appears stable. He is scheduled to see Dr. Tresa Moore later this week to discuss surgery. He will return for a follow-up visit with Dr. Alen Blew in 2 months.  He will receive the influenza vaccine today.  Plan reviewed with Dr. Alen Blew.    Ned Card ANP/GNP-BC   05/23/2017  9:07 AM

## 2017-05-25 DIAGNOSIS — M1000 Idiopathic gout, unspecified site: Secondary | ICD-10-CM | POA: Diagnosis not present

## 2017-05-25 DIAGNOSIS — Z1389 Encounter for screening for other disorder: Secondary | ICD-10-CM | POA: Diagnosis not present

## 2017-05-25 DIAGNOSIS — G894 Chronic pain syndrome: Secondary | ICD-10-CM | POA: Diagnosis not present

## 2017-05-25 DIAGNOSIS — C678 Malignant neoplasm of overlapping sites of bladder: Secondary | ICD-10-CM | POA: Diagnosis not present

## 2017-05-25 DIAGNOSIS — Z6826 Body mass index (BMI) 26.0-26.9, adult: Secondary | ICD-10-CM | POA: Diagnosis not present

## 2017-05-26 DIAGNOSIS — N323 Diverticulum of bladder: Secondary | ICD-10-CM | POA: Diagnosis not present

## 2017-05-26 DIAGNOSIS — R339 Retention of urine, unspecified: Secondary | ICD-10-CM | POA: Diagnosis not present

## 2017-05-26 DIAGNOSIS — C678 Malignant neoplasm of overlapping sites of bladder: Secondary | ICD-10-CM | POA: Diagnosis not present

## 2017-05-27 ENCOUNTER — Other Ambulatory Visit: Payer: Self-pay | Admitting: Urology

## 2017-05-30 ENCOUNTER — Other Ambulatory Visit: Payer: Self-pay | Admitting: Urology

## 2017-05-30 ENCOUNTER — Ambulatory Visit: Payer: Medicare Other

## 2017-05-30 ENCOUNTER — Other Ambulatory Visit: Payer: Medicare Other

## 2017-06-14 DIAGNOSIS — N320 Bladder-neck obstruction: Secondary | ICD-10-CM | POA: Diagnosis not present

## 2017-06-14 DIAGNOSIS — R3121 Asymptomatic microscopic hematuria: Secondary | ICD-10-CM | POA: Diagnosis not present

## 2017-06-14 DIAGNOSIS — C678 Malignant neoplasm of overlapping sites of bladder: Secondary | ICD-10-CM | POA: Diagnosis not present

## 2017-06-28 ENCOUNTER — Inpatient Hospital Stay (HOSPITAL_COMMUNITY)
Admission: RE | Admit: 2017-06-28 | Discharge: 2017-07-05 | DRG: 655 | Disposition: A | Discharge status: Home Health | Payer: Medicare Other | Source: Ambulatory Visit | Attending: Urology | Admitting: Urology

## 2017-06-28 ENCOUNTER — Other Ambulatory Visit: Payer: Self-pay

## 2017-06-28 ENCOUNTER — Encounter (HOSPITAL_COMMUNITY): Payer: Self-pay

## 2017-06-28 DIAGNOSIS — R338 Other retention of urine: Secondary | ICD-10-CM | POA: Diagnosis present

## 2017-06-28 DIAGNOSIS — Z881 Allergy status to other antibiotic agents status: Secondary | ICD-10-CM

## 2017-06-28 DIAGNOSIS — Z936 Other artificial openings of urinary tract status: Secondary | ICD-10-CM

## 2017-06-28 DIAGNOSIS — Z79899 Other long term (current) drug therapy: Secondary | ICD-10-CM | POA: Diagnosis not present

## 2017-06-28 DIAGNOSIS — N401 Enlarged prostate with lower urinary tract symptoms: Secondary | ICD-10-CM | POA: Diagnosis present

## 2017-06-28 DIAGNOSIS — K219 Gastro-esophageal reflux disease without esophagitis: Secondary | ICD-10-CM | POA: Diagnosis present

## 2017-06-28 DIAGNOSIS — N323 Diverticulum of bladder: Secondary | ICD-10-CM | POA: Diagnosis present

## 2017-06-28 DIAGNOSIS — Z7982 Long term (current) use of aspirin: Secondary | ICD-10-CM

## 2017-06-28 DIAGNOSIS — M109 Gout, unspecified: Secondary | ICD-10-CM | POA: Diagnosis present

## 2017-06-28 DIAGNOSIS — Z9842 Cataract extraction status, left eye: Secondary | ICD-10-CM

## 2017-06-28 DIAGNOSIS — C678 Malignant neoplasm of overlapping sites of bladder: Secondary | ICD-10-CM | POA: Diagnosis not present

## 2017-06-28 DIAGNOSIS — Z86010 Personal history of colonic polyps: Secondary | ICD-10-CM

## 2017-06-28 DIAGNOSIS — Z9841 Cataract extraction status, right eye: Secondary | ICD-10-CM | POA: Diagnosis not present

## 2017-06-28 DIAGNOSIS — C679 Malignant neoplasm of bladder, unspecified: Principal | ICD-10-CM | POA: Diagnosis present

## 2017-06-28 DIAGNOSIS — J449 Chronic obstructive pulmonary disease, unspecified: Secondary | ICD-10-CM | POA: Diagnosis present

## 2017-06-28 DIAGNOSIS — N320 Bladder-neck obstruction: Secondary | ICD-10-CM | POA: Diagnosis present

## 2017-06-28 DIAGNOSIS — N4289 Other specified disorders of prostate: Secondary | ICD-10-CM | POA: Diagnosis not present

## 2017-06-28 DIAGNOSIS — Z87891 Personal history of nicotine dependence: Secondary | ICD-10-CM

## 2017-06-28 DIAGNOSIS — Z792 Long term (current) use of antibiotics: Secondary | ICD-10-CM

## 2017-06-28 DIAGNOSIS — Z79891 Long term (current) use of opiate analgesic: Secondary | ICD-10-CM

## 2017-06-28 DIAGNOSIS — C672 Malignant neoplasm of lateral wall of bladder: Secondary | ICD-10-CM | POA: Diagnosis not present

## 2017-06-28 DIAGNOSIS — Z961 Presence of intraocular lens: Secondary | ICD-10-CM | POA: Diagnosis present

## 2017-06-28 LAB — CBC
HCT: 36.1 % — ABNORMAL LOW (ref 39.0–52.0)
Hemoglobin: 12.4 g/dL — ABNORMAL LOW (ref 13.0–17.0)
MCH: 35.4 pg — ABNORMAL HIGH (ref 26.0–34.0)
MCHC: 34.3 g/dL (ref 30.0–36.0)
MCV: 103.1 fL — ABNORMAL HIGH (ref 78.0–100.0)
Platelets: 175 10*3/uL (ref 150–400)
RBC: 3.5 MIL/uL — ABNORMAL LOW (ref 4.22–5.81)
RDW: 13.4 % (ref 11.5–15.5)
WBC: 6.8 10*3/uL (ref 4.0–10.5)

## 2017-06-28 LAB — ABO/RH: ABO/RH(D): A POS

## 2017-06-28 LAB — COMPREHENSIVE METABOLIC PANEL
ALT: 12 U/L — ABNORMAL LOW (ref 17–63)
AST: 11 U/L — ABNORMAL LOW (ref 15–41)
Albumin: 2.8 g/dL — ABNORMAL LOW (ref 3.5–5.0)
Alkaline Phosphatase: 63 U/L (ref 38–126)
Anion gap: 5 (ref 5–15)
BUN: 12 mg/dL (ref 6–20)
CO2: 28 mmol/L (ref 22–32)
Calcium: 8.6 mg/dL — ABNORMAL LOW (ref 8.9–10.3)
Chloride: 112 mmol/L — ABNORMAL HIGH (ref 101–111)
Creatinine, Ser: 1.1 mg/dL (ref 0.61–1.24)
GFR calc Af Amer: >60 mL/min (ref >60)
GFR calc non Af Amer: >60 mL/min (ref >60)
Glucose, Bld: 105 mg/dL — ABNORMAL HIGH (ref 65–99)
Potassium: 3.7 mmol/L (ref 3.5–5.1)
Sodium: 145 mmol/L (ref 135–145)
Total Bilirubin: 0.8 mg/dL (ref 0.3–1.2)
Total Protein: 5.2 g/dL — ABNORMAL LOW (ref 6.5–8.1)

## 2017-06-28 LAB — MRSA PCR SCREENING: MRSA by PCR: NEGATIVE

## 2017-06-28 LAB — PREPARE RBC (CROSSMATCH)

## 2017-06-28 MED ORDER — ACETAMINOPHEN 325 MG PO TABS
325.0000 mg | ORAL_TABLET | Freq: Four times a day (QID) | ORAL | Status: DC | PRN
  Administered 2017-06-28: 325 mg via ORAL
  Filled 2017-06-28: qty 1

## 2017-06-28 MED ORDER — NEOMYCIN SULFATE 500 MG PO TABS
1000.0000 mg | ORAL_TABLET | Freq: Three times a day (TID) | ORAL | Status: AC
  Administered 2017-06-28 – 2017-06-29 (×3): 1000 mg via ORAL
  Filled 2017-06-28 (×3): qty 2

## 2017-06-28 MED ORDER — ALVIMOPAN 12 MG PO CAPS
12.0000 mg | ORAL_CAPSULE | ORAL | Status: AC
Start: 2017-06-29 — End: 2017-06-29
  Administered 2017-06-29: 12 mg via ORAL
  Filled 2017-06-28: qty 1

## 2017-06-28 MED ORDER — PEG 3350-KCL-NA BICARB-NACL 420 G PO SOLR
4000.0000 mL | Freq: Once | ORAL | Status: AC
  Administered 2017-06-28: 4000 mL via ORAL

## 2017-06-28 MED ORDER — PIPERACILLIN-TAZOBACTAM 3.375 G IVPB 30 MIN
3.3750 g | INTRAVENOUS | Status: AC
  Administered 2017-06-29: 3.375 g via INTRAVENOUS
  Filled 2017-06-28: qty 50

## 2017-06-28 MED ORDER — METRONIDAZOLE 500 MG PO TABS
500.0000 mg | ORAL_TABLET | Freq: Three times a day (TID) | ORAL | Status: AC
  Administered 2017-06-28 – 2017-06-29 (×3): 500 mg via ORAL
  Filled 2017-06-28 (×3): qty 1

## 2017-06-28 NOTE — Discharge Instructions (Signed)

## 2017-06-28 NOTE — Consult Note (Signed)
  [facility] Nurse ostomy consult note  [facility] Nurse requested for preoperative stoma site marking by Dr. Tresa Moore.  Discussed surgical procedure and stoma creation with patient and wife.  Explained role of the [facility] nurse team.  Provided the patient with educational booklet and provided samples of pouching options.  Answered patient's and wife's questions.   Examined patient lying, sitting, and standing in order to place the marking in the patient's visual field, away from any creases or abdominal contour issues and within the rectus muscle.    Marked for ileal conduit in the RUQ 6.5cm to the right of the umbilicus and  8.1EX above the umbilicus.  Patient's abdomen cleansed with CHG wipes at site markings, allowed to air dry prior to marking.Covered mark with thin film transparent dressing to preserve mark until date of surgery (tomorrow).   [facility] nursing team will follow, and  will remain available to this patient, the nursing and medical teams. Thanks, Maudie Flakes, MSN, RN, [place], Arther Abbott  Pager# [phone]

## 2017-06-28 NOTE — Anesthesia Preprocedure Evaluation (Addendum)
Anesthesia Evaluation  Patient identified by MRN, date of birth, ID band Patient awake    Reviewed: Allergy & Precautions, NPO status , Patient's Chart, lab work & pertinent test results  Airway Mallampati: III  TM Distance: >3 FB Neck ROM: Full    Dental  (+) Upper Dentures, Caps, Dental Advisory Given,    Pulmonary COPD, former smoker,    breath sounds clear to auscultation       Cardiovascular Exercise Tolerance: Good (-) Past MI negative cardio ROS   Rhythm:Regular Rate:Normal     Neuro/Psych negative neurological ROS     GI/Hepatic Neg liver ROS, hiatal hernia, GERD  Medicated and Controlled,  Endo/Other  negative endocrine ROS  Renal/GU negative Renal ROS Bladder dysfunction  Bladder cancer    Musculoskeletal negative musculoskeletal ROS (+)   Abdominal   Peds  Hematology negative hematology ROS (+)   Anesthesia Other Findings   Reproductive/Obstetrics                            Anesthesia Physical  Anesthesia Plan  ASA: III  Anesthesia Plan: General   Post-op Pain Management:    Induction: Intravenous  PONV Risk Score and Plan: 4 or greater and Ondansetron, Dexamethasone and Treatment may vary due to age or medical condition  Airway Management Planned: Oral ETT  Additional Equipment: None  Intra-op Plan:   Post-operative Plan: Extubation in OR  Informed Consent: I have reviewed the patients History and Physical, chart, labs and discussed the procedure including the risks, benefits and alternatives for the proposed anesthesia with the patient or authorized representative who has indicated his/her understanding and acceptance.   Dental advisory given  Plan Discussed with: CRNA  Anesthesia Plan Comments: (Bilateral blood pressure cuffs, alternate sides throughout case periodically to minimize risk of nerve injury.)       Anesthesia Quick Evaluation

## 2017-06-28 NOTE — H&P (Signed)
[name] is an 76 y.o. male.    Chief Complaint: Pre-op Cystoprostatectomy  HPI:   1 - Muscle Invasive Bladder Cancer - T2G3 bladder caner by TURBT 03/2017. Staging CT localized but with significant left Hutch bladder diverticulum. Long h/o superficial high grade bladder cancer since 2016 treated with TURBT and BCG induction. Pt underwent trial of neoadjuvant chemo but this was suspended before completion of 1 cycle due to poor pt tolerance as he favors up front surgery given local symptoms. Cr <1.5.   2 - Urinary Retention / Bladder Neck Contracture - pt catheter dependant 2018 likely from prostatic / bladder neck stricture. Now out of retention.   PMH sig for Gout.  NO chest / open abd surgeries. His PCP is [name] [facility].   Today " [name] " is seen for pre-op admission, labs, bowel prep, stomal marking in preparation for cystectomy tomorrow. No interval fevers. Had left lower tooth pulled last week for infection that has completely resolved, now done with ABX for that.    Past Medical History:  Diagnosis Date  . BPH (benign prostatic hypertrophy)   . Dysuria   . GERD (gastroesophageal reflux disease)   . History of adenomatous polyp of colon    tubular adenoma 06/ 2018  . History of bladder cancer 12/2014   urologist-  dr [name]--  dx High Grade TCC without stromal invasion s/p TURBT and intravesical BCG tx/  recurrent 07/2015 (pTa) TCC  repear intravesical BCG tx   . History of hiatal hernia   . History of urethral stricture   . Nocturia     Past Surgical History:  Procedure Laterality Date  . CARDIAC CATHETERIZATION  1997   normal coronary arteries  . CARDIOVASCULAR STRESS TEST  12-14-2005   Low risk perfusion study/  very small scar in the inferior septum from mid ventricle to apex,  no ischemia/  mild inferior septal hypokinesis/  ef 58%  . CATARACT EXTRACTION W/ INTRAOCULAR LENS  IMPLANT, BILATERAL  2011  . COLONOSCOPY  last one 06/ 2018  .  ESOPHAGOGASTRODUODENOSCOPY  12-05-2014  . TRANSTHORACIC ECHOCARDIOGRAM  01-31-2008   nomral LVF,  ef 55-65%/  mild pulmonic stenosis without regurg.,  peak transpulomonic valve gradient 35mmHg    Family History  Problem Relation Age of Onset  . Colon cancer Neg Hx   . Colon polyps Neg Hx   . Diabetes Neg Hx   . Kidney disease Neg Hx   . Esophageal cancer Neg Hx   . Heart disease Neg Hx   . Gallbladder disease Neg Hx    Social History:  reports that he quit smoking about 28 years ago. His smoking use included cigarettes. He has a 34.00 pack-year smoking history. he has never used smokeless tobacco. He reports that he drinks about 3.6 oz of alcohol per week. He reports that he does not use drugs.  Allergies:  Allergies  Allergen Reactions  . Ciprofloxacin Hives    Medications Prior to Admission  Medication Sig Dispense Refill  . acetaminophen (TYLENOL) 500 MG tablet Take 500 mg every 6 (six) hours as needed by mouth for moderate pain or headache.    Marland Kitchen amoxicillin (AMOXIL) 875 MG tablet Take 875 mg 2 (two) times daily by mouth.    Marland Kitchen HYDROcodone-ibuprofen (VICOPROFEN) 7.5-200 MG tablet Take 1 tablet every 6 (six) hours as needed by mouth for moderate pain.     . tamsulosin (FLOMAX) 0.4 MG CAPS capsule Take 0.4 mg 2 (two) times daily by  mouth.     . aspirin EC 81 MG tablet Take 81 mg by mouth daily.    . prochlorperazine (COMPAZINE) 10 MG tablet Take 1 tablet (10 mg total) by mouth every 6 (six) hours as needed for nausea or vomiting. (Patient not taking: Reported on 05/23/2017) 30 tablet 0    No results found for this or any previous visit (from the past 48 hour(s)). No results found.  Review of Systems  Constitutional: Negative.  Negative for chills.  HENT: Negative.   Eyes: Negative.   Respiratory: Negative.   Cardiovascular: Negative.   Gastrointestinal: Negative.   Genitourinary: Positive for hematuria.  Musculoskeletal: Negative.   Skin: Negative.   Neurological:  Negative.   Endo/Heme/Allergies: Negative.   Psychiatric/Behavioral: Negative.     Blood pressure (!) 147/84, pulse 78, temperature 97.6 F (36.4 C), temperature source Oral, resp. rate 16, height 6' (1.829 m), weight 81.6 kg (179 lb 14.3 oz), SpO2 95 %. Physical Exam  Constitutional: He appears well-developed.  HENT:  Head: Normocephalic.  Eyes: Pupils are equal, round, and reactive to light.  Neck: Normal range of motion.  Cardiovascular: Normal rate.  Respiratory: Effort normal.  GI: Soft.  Genitourinary:  Genitourinary Comments: No CVAT.   Musculoskeletal: Normal range of motion.  Neurological: He is alert.  Skin: Skin is warm.  Psychiatric: He has a normal mood and affect.     Assessment/Plan  Proceed as planned with cystoprostatectomy and conduit diversion. RIsks, benefits, alternatives, expected peri-op course discussed previously and reiterated today.  Clears, NPO p MN, CMP, CBC, Stomal Consult, Entereg, Consent.   Alexis Frock, MD 06/28/2017, 11:51 AM

## 2017-06-29 ENCOUNTER — Encounter (HOSPITAL_COMMUNITY)
Admission: RE | Disposition: A | Discharge status: Home Health | Payer: Self-pay | Source: Ambulatory Visit | Attending: Urology

## 2017-06-29 ENCOUNTER — Inpatient Hospital Stay (HOSPITAL_COMMUNITY): Payer: Medicare Other | Admitting: Anesthesiology

## 2017-06-29 ENCOUNTER — Encounter (HOSPITAL_COMMUNITY): Payer: Self-pay | Admitting: Registered Nurse

## 2017-06-29 DIAGNOSIS — Z936 Other artificial openings of urinary tract status: Secondary | ICD-10-CM

## 2017-06-29 HISTORY — PX: CYSTOSCOPY WITH INJECTION: SHX1424

## 2017-06-29 LAB — HEMOGLOBIN AND HEMATOCRIT, BLOOD
HCT: 37.2 % — ABNORMAL LOW (ref 39.0–52.0)
Hemoglobin: 12.9 g/dL — ABNORMAL LOW (ref 13.0–17.0)

## 2017-06-29 SURGERY — ROBOT ASSISTED LAPAROSCOPIC RADICAL CYSTOPROSTATECTOMY BILATERAL PELVIC LYMPHADENECTOMY,ORTHOTOPIC NEOBLADDER
Anesthesia: General

## 2017-06-29 MED ORDER — HYDRALAZINE HCL 20 MG/ML IJ SOLN
INTRAMUSCULAR | Status: DC | PRN
  Administered 2017-06-29 (×2): 5 mg via INTRAVENOUS

## 2017-06-29 MED ORDER — HYDROMORPHONE HCL 2 MG/ML IJ SOLN
INTRAMUSCULAR | Status: AC
  Filled 2017-06-29: qty 1

## 2017-06-29 MED ORDER — ONDANSETRON HCL 4 MG/2ML IJ SOLN
INTRAMUSCULAR | Status: DC | PRN
  Administered 2017-06-29: 4 mg via INTRAVENOUS

## 2017-06-29 MED ORDER — LABETALOL HCL 5 MG/ML IV SOLN
INTRAVENOUS | Status: AC
  Filled 2017-06-29: qty 4

## 2017-06-29 MED ORDER — HYDROMORPHONE HCL 1 MG/ML IJ SOLN
INTRAMUSCULAR | Status: DC | PRN
  Administered 2017-06-29 (×4): 0.5 mg via INTRAVENOUS

## 2017-06-29 MED ORDER — MIDAZOLAM HCL 2 MG/2ML IJ SOLN
INTRAMUSCULAR | Status: AC
  Filled 2017-06-29: qty 2

## 2017-06-29 MED ORDER — SODIUM CHLORIDE 0.9% FLUSH: 9.0000 mL | INTRAVENOUS | Status: DC | PRN

## 2017-06-29 MED ORDER — SODIUM CHLORIDE 0.9 % IJ SOLN
INTRAMUSCULAR | Status: AC
  Filled 2017-06-29: qty 10

## 2017-06-29 MED ORDER — ROCURONIUM BROMIDE 50 MG/5ML IV SOSY
PREFILLED_SYRINGE | INTRAVENOUS | Status: AC
  Filled 2017-06-29: qty 5

## 2017-06-29 MED ORDER — FENTANYL CITRATE (PF) 100 MCG/2ML IJ SOLN
25.0000 ug | INTRAMUSCULAR | Status: DC | PRN
  Administered 2017-06-29: 50 ug via INTRAVENOUS
  Administered 2017-06-29 (×2): 25 ug via INTRAVENOUS

## 2017-06-29 MED ORDER — LABETALOL HCL 5 MG/ML IV SOLN
INTRAVENOUS | Status: DC | PRN
  Administered 2017-06-29 (×4): 5 mg via INTRAVENOUS

## 2017-06-29 MED ORDER — ROCURONIUM BROMIDE 100 MG/10ML IV SOLN
INTRAVENOUS | Status: DC | PRN
  Administered 2017-06-29 (×2): 20 mg via INTRAVENOUS
  Administered 2017-06-29: 10 mg via INTRAVENOUS
  Administered 2017-06-29: 50 mg via INTRAVENOUS
  Administered 2017-06-29 (×2): 10 mg via INTRAVENOUS

## 2017-06-29 MED ORDER — HYDROMORPHONE 1 MG/ML IV SOLN
INTRAVENOUS | Status: DC
  Administered 2017-06-29: 18:00:00 via INTRAVENOUS
  Administered 2017-06-29: 1.4 mg via INTRAVENOUS
  Administered 2017-06-30: 1.5 mg via INTRAVENOUS
  Administered 2017-06-30: 0.9 mg via INTRAVENOUS
  Administered 2017-06-30: 1.2 mg via INTRAVENOUS
  Administered 2017-06-30: 0.9 mg via INTRAVENOUS
  Administered 2017-06-30: 3.9 mg via INTRAVENOUS
  Administered 2017-06-30: 1.8 mg via INTRAVENOUS
  Administered 2017-07-01: 2.7 mg via INTRAVENOUS
  Administered 2017-07-01: 2.1 mg via INTRAVENOUS
  Administered 2017-07-01: 5.1 mg via INTRAVENOUS
  Administered 2017-07-01: 1.8 mg via INTRAVENOUS
  Administered 2017-07-01: 3.3 mg via INTRAVENOUS
  Administered 2017-07-01: 11:00:00 via INTRAVENOUS
  Administered 2017-07-02: 1.5 mg via INTRAVENOUS
  Administered 2017-07-02: 2.1 mg via INTRAVENOUS
  Administered 2017-07-02: 1.2 mg via INTRAVENOUS
  Administered 2017-07-03: 02:00:00 via INTRAVENOUS
  Administered 2017-07-03: 4.5 mg via INTRAVENOUS
  Administered 2017-07-03: 1.5 mg via INTRAVENOUS
  Administered 2017-07-03: 3.3 mg via INTRAVENOUS
  Administered 2017-07-03: 3.9 mg via INTRAVENOUS
  Administered 2017-07-03: 3.3 mg via INTRAVENOUS
  Administered 2017-07-03: 3.6 mg via INTRAVENOUS
  Administered 2017-07-04: 2.7 mg via INTRAVENOUS
  Administered 2017-07-04: 3.6 mg via INTRAVENOUS
  Administered 2017-07-04: 03:00:00 via INTRAVENOUS
  Administered 2017-07-04: 2.4 mg via INTRAVENOUS
  Filled 2017-06-29 (×5): qty 25

## 2017-06-29 MED ORDER — LACTATED RINGERS IR SOLN
Status: DC | PRN
  Administered 2017-06-29: 1

## 2017-06-29 MED ORDER — SODIUM CHLORIDE 0.9 % IJ SOLN
INTRAMUSCULAR | Status: DC | PRN
  Administered 2017-06-29: 20 mL

## 2017-06-29 MED ORDER — OXYCODONE HCL 5 MG PO TABS: 5.0000 mg | ORAL_TABLET | ORAL | Status: DC | PRN

## 2017-06-29 MED ORDER — DEXTROSE-NACL 5-0.45 % IV SOLN
INTRAVENOUS | Status: DC
  Administered 2017-06-29 – 2017-07-03 (×8): via INTRAVENOUS

## 2017-06-29 MED ORDER — PROPOFOL 10 MG/ML IV BOLUS
INTRAVENOUS | Status: DC | PRN
  Administered 2017-06-29: 50 mg via INTRAVENOUS
  Administered 2017-06-29: 150 mg via INTRAVENOUS

## 2017-06-29 MED ORDER — DIPHENHYDRAMINE HCL 12.5 MG/5ML PO ELIX: 12.5000 mg | ORAL_SOLUTION | Freq: Four times a day (QID) | ORAL | Status: DC | PRN

## 2017-06-29 MED ORDER — PROPOFOL 10 MG/ML IV BOLUS
INTRAVENOUS | Status: AC
  Filled 2017-06-29: qty 20

## 2017-06-29 MED ORDER — STERILE WATER FOR IRRIGATION IR SOLN
Status: DC | PRN
  Administered 2017-06-29: 1000 mL

## 2017-06-29 MED ORDER — NALOXONE HCL 0.4 MG/ML IJ SOLN: 0.4000 mg | INTRAMUSCULAR | Status: DC | PRN

## 2017-06-29 MED ORDER — FENTANYL CITRATE (PF) 100 MCG/2ML IJ SOLN: INTRAMUSCULAR | Status: DC | PRN

## 2017-06-29 MED ORDER — SUFENTANIL CITRATE 50 MCG/ML IV SOLN
INTRAVENOUS | Status: AC
  Filled 2017-06-29: qty 1

## 2017-06-29 MED ORDER — FENTANYL CITRATE (PF) 100 MCG/2ML IJ SOLN
INTRAMUSCULAR | Status: AC
  Filled 2017-06-29: qty 2

## 2017-06-29 MED ORDER — ONDANSETRON HCL 4 MG/2ML IJ SOLN
4.0000 mg | Freq: Four times a day (QID) | INTRAMUSCULAR | Status: DC | PRN
  Administered 2017-06-30: 4 mg via INTRAVENOUS
  Filled 2017-06-29: qty 2

## 2017-06-29 MED ORDER — DEXAMETHASONE SODIUM PHOSPHATE 10 MG/ML IJ SOLN
INTRAMUSCULAR | Status: DC | PRN
  Administered 2017-06-29: 10 mg via INTRAVENOUS

## 2017-06-29 MED ORDER — ONDANSETRON HCL 4 MG/2ML IJ SOLN
INTRAMUSCULAR | Status: AC
  Filled 2017-06-29: qty 2

## 2017-06-29 MED ORDER — LIDOCAINE HCL (CARDIAC) 20 MG/ML IV SOLN
INTRAVENOUS | Status: DC | PRN
  Administered 2017-06-29: 25 mg via INTRATRACHEAL
  Administered 2017-06-29: 75 mg via INTRAVENOUS

## 2017-06-29 MED ORDER — PIPERACILLIN-TAZOBACTAM 3.375 G IVPB
INTRAVENOUS | Status: AC
  Filled 2017-06-29: qty 50

## 2017-06-29 MED ORDER — SUFENTANIL CITRATE 50 MCG/ML IV SOLN
INTRAVENOUS | Status: DC | PRN
  Administered 2017-06-29 (×2): 5 ug via INTRAVENOUS
  Administered 2017-06-29 (×2): 10 ug via INTRAVENOUS
  Administered 2017-06-29: 5 ug via INTRAVENOUS
  Administered 2017-06-29: 15 ug via INTRAVENOUS
  Administered 2017-06-29: 5 ug via INTRAVENOUS
  Administered 2017-06-29: 15 ug via INTRAVENOUS
  Administered 2017-06-29: 10 ug via INTRAVENOUS
  Administered 2017-06-29: 5 ug via INTRAVENOUS
  Administered 2017-06-29 (×3): 10 ug via INTRAVENOUS
  Administered 2017-06-29: 5 ug via INTRAVENOUS
  Administered 2017-06-29: 15 ug via INTRAVENOUS
  Administered 2017-06-29: 5 ug via INTRAVENOUS
  Administered 2017-06-29 (×2): 10 ug via INTRAVENOUS
  Administered 2017-06-29: 5 ug via INTRAVENOUS

## 2017-06-29 MED ORDER — BUPIVACAINE LIPOSOME 1.3 % IJ SUSP
20.0000 mL | Freq: Once | INTRAMUSCULAR | Status: AC
  Administered 2017-06-29: 20 mL
  Filled 2017-06-29: qty 20

## 2017-06-29 MED ORDER — METOPROLOL TARTRATE 5 MG/5ML IV SOLN
INTRAVENOUS | Status: DC | PRN
  Administered 2017-06-29: 2 mg via INTRAVENOUS
  Administered 2017-06-29 (×2): 1 mg via INTRAVENOUS

## 2017-06-29 MED ORDER — LACTATED RINGERS IV SOLN
INTRAVENOUS | Status: DC | PRN
  Administered 2017-06-29: 08:00:00 via INTRAVENOUS

## 2017-06-29 MED ORDER — ALVIMOPAN 12 MG PO CAPS
12.0000 mg | ORAL_CAPSULE | Freq: Two times a day (BID) | ORAL | Status: DC
  Administered 2017-06-30 – 2017-07-03 (×7): 12 mg via ORAL
  Filled 2017-06-29 (×7): qty 1

## 2017-06-29 MED ORDER — ACETAMINOPHEN 10 MG/ML IV SOLN
1000.0000 mg | Freq: Four times a day (QID) | INTRAVENOUS | Status: AC
  Administered 2017-06-29 – 2017-06-30 (×4): 1000 mg via INTRAVENOUS
  Filled 2017-06-29 (×4): qty 100

## 2017-06-29 MED ORDER — LIDOCAINE 2% (20 MG/ML) 5 ML SYRINGE
INTRAMUSCULAR | Status: AC
  Filled 2017-06-29: qty 5

## 2017-06-29 MED ORDER — ROCURONIUM BROMIDE 50 MG/5ML IV SOSY
PREFILLED_SYRINGE | INTRAVENOUS | Status: AC
  Filled 2017-06-29: qty 10

## 2017-06-29 MED ORDER — SUGAMMADEX SODIUM 200 MG/2ML IV SOLN
INTRAVENOUS | Status: DC | PRN
  Administered 2017-06-29: 175 mg via INTRAVENOUS

## 2017-06-29 MED ORDER — METOPROLOL TARTRATE 5 MG/5ML IV SOLN
INTRAVENOUS | Status: AC
  Filled 2017-06-29: qty 5

## 2017-06-29 MED ORDER — SODIUM CHLORIDE 0.9 % IR SOLN
Status: DC | PRN
  Administered 2017-06-29: 1000 mL via INTRAVESICAL

## 2017-06-29 MED ORDER — LACTATED RINGERS IV SOLN
INTRAVENOUS | Status: DC | PRN
  Administered 2017-06-29 (×3): via INTRAVENOUS

## 2017-06-29 MED ORDER — DEXAMETHASONE SODIUM PHOSPHATE 10 MG/ML IJ SOLN
INTRAMUSCULAR | Status: AC
  Filled 2017-06-29: qty 1

## 2017-06-29 MED ORDER — LACTATED RINGERS IV SOLN: INTRAVENOUS | Status: DC | PRN

## 2017-06-29 MED ORDER — ONDANSETRON HCL 4 MG/2ML IJ SOLN: 4.0000 mg | Freq: Once | INTRAMUSCULAR | Status: DC | PRN

## 2017-06-29 MED ORDER — MIDAZOLAM HCL 5 MG/5ML IJ SOLN
INTRAMUSCULAR | Status: DC | PRN
  Administered 2017-06-29 (×2): 1 mg via INTRAVENOUS

## 2017-06-29 MED ORDER — HYDRALAZINE HCL 20 MG/ML IJ SOLN
INTRAMUSCULAR | Status: AC
  Filled 2017-06-29: qty 1

## 2017-06-29 MED ORDER — SUGAMMADEX SODIUM 200 MG/2ML IV SOLN
INTRAVENOUS | Status: AC
  Filled 2017-06-29: qty 2

## 2017-06-29 MED ORDER — DIPHENHYDRAMINE HCL 50 MG/ML IJ SOLN
12.5000 mg | Freq: Four times a day (QID) | INTRAMUSCULAR | Status: DC | PRN
  Administered 2017-06-30: 12.5 mg via INTRAVENOUS
  Filled 2017-06-29: qty 1

## 2017-06-29 SURGICAL SUPPLY — 92 items
ADH SKN CLS APL DERMABOND .7 (GAUZE/BANDAGES/DRESSINGS) ×1
AGENT HMST KT MTR STRL THRMB (HEMOSTASIS)
APL ESCP 34 STRL LF DISP (HEMOSTASIS)
APPLICATOR COTTON TIP 6IN STRL (MISCELLANEOUS) ×2 IMPLANT
APPLICATOR SURGIFLO ENDO (HEMOSTASIS) IMPLANT
BAG LAPAROSCOPIC 12 15 PORT 16 (BASKET) ×1 IMPLANT
BAG RETRIEVAL 12/15 (BASKET) ×2
BAG URO CATCHER STRL LF (MISCELLANEOUS) ×2 IMPLANT
BLADE SURG SZ10 CARB STEEL (BLADE) IMPLANT
CATH FOLEY 2WAY SLVR 18FR 30CC (CATHETERS) ×2 IMPLANT
CELLS DAT CNTRL 66122 CELL SVR (MISCELLANEOUS) ×1 IMPLANT
CHLORAPREP W/TINT 26ML (MISCELLANEOUS) ×2 IMPLANT
CLIP VESOLOCK LG 6/CT PURPLE (CLIP) ×4 IMPLANT
CLIP VESOLOCK MED LG 6/CT (CLIP) ×2 IMPLANT
CLIP VESOLOCK XL 6/CT (CLIP) ×4 IMPLANT
CLOTH BEACON ORANGE TIMEOUT ST (SAFETY) ×2 IMPLANT
COVER FOOTSWITCH UNIV (MISCELLANEOUS) IMPLANT
COVER SURGICAL LIGHT HANDLE (MISCELLANEOUS) ×2 IMPLANT
COVER TIP SHEARS 8 DVNC (MISCELLANEOUS) ×1 IMPLANT
COVER TIP SHEARS 8MM DA VINCI (MISCELLANEOUS) ×1
DECANTER SPIKE VIAL GLASS SM (MISCELLANEOUS) ×2 IMPLANT
DERMABOND ADVANCED (GAUZE/BANDAGES/DRESSINGS) ×1
DERMABOND ADVANCED .7 DNX12 (GAUZE/BANDAGES/DRESSINGS) ×2 IMPLANT
DRAIN PENROSE 18X1/2 LTX STRL (DRAIN) IMPLANT
DRAPE ARM DVNC X/XI (DISPOSABLE) ×4 IMPLANT
DRAPE COLUMN DVNC XI (DISPOSABLE) ×1 IMPLANT
DRAPE DA VINCI XI ARM (DISPOSABLE) ×4
DRAPE DA VINCI XI COLUMN (DISPOSABLE) ×1
ELECT CAUTERY BLADE 6.4 (BLADE) ×2 IMPLANT
ELECT REM PT RETURN 15FT ADLT (MISCELLANEOUS) ×2 IMPLANT
GLOVE BIO SURGEON STRL SZ 6.5 (GLOVE) ×4 IMPLANT
GLOVE BIOGEL M STRL SZ7.5 (GLOVE) ×6 IMPLANT
GOWN STRL REUS W/TWL LRG LVL3 (GOWN DISPOSABLE) ×10 IMPLANT
GOWN STRL REUS W/TWL XL LVL3 (GOWN DISPOSABLE) ×2 IMPLANT
IRRIG SUCT STRYKERFLOW 2 WTIP (MISCELLANEOUS) ×2
IRRIGATION SUCT STRKRFLW 2 WTP (MISCELLANEOUS) ×1 IMPLANT
KIT PROCEDURE DA VINCI SI (MISCELLANEOUS) ×1
KIT PROCEDURE DVNC SI (MISCELLANEOUS) IMPLANT
LOOP VESSEL MAXI BLUE (MISCELLANEOUS) ×2 IMPLANT
LOOP VESSEL MINI RED (MISCELLANEOUS) IMPLANT
MANIFOLD NEPTUNE II (INSTRUMENTS) ×2 IMPLANT
NDL INSUFFLATION 14GA 120MM (NEEDLE) ×1 IMPLANT
NEEDLE INSUFFLATION 14GA 120MM (NEEDLE) ×2 IMPLANT
PACK CYSTO (CUSTOM PROCEDURE TRAY) ×2 IMPLANT
PACK ROBOT UROLOGY CUSTOM (CUSTOM PROCEDURE TRAY) ×2 IMPLANT
PAD POSITIONING PINK XL (MISCELLANEOUS) ×2 IMPLANT
PORT ACCESS TROCAR AIRSEAL 12 (TROCAR) ×1 IMPLANT
PORT ACCESS TROCAR AIRSEAL 5M (TROCAR) ×1
RELOAD STAPLE 60 2.6 WHT THN (STAPLE) ×7 IMPLANT
RELOAD STAPLE 60 4.1 GRN THCK (STAPLE) ×5 IMPLANT
RELOAD STAPLER GREEN 60MM (STAPLE) ×9 IMPLANT
RELOAD STAPLER WHITE 60MM (STAPLE) ×11 IMPLANT
RETRACTOR LONRSTAR 16.6X16.6CM (MISCELLANEOUS) IMPLANT
RETRACTOR STAY HOOK 5MM (MISCELLANEOUS) IMPLANT
RETRACTOR STER APS 16.6X16.6CM (MISCELLANEOUS)
RETRACTOR WND ALEXIS 18 MED (MISCELLANEOUS) ×1 IMPLANT
RTRCTR WOUND ALEXIS 18CM MED (MISCELLANEOUS) ×2
SEAL CANN UNIV 5-8 DVNC XI (MISCELLANEOUS) ×4 IMPLANT
SEAL XI 5MM-8MM UNIVERSAL (MISCELLANEOUS) ×4
SOLUTION ELECTROLUBE (MISCELLANEOUS) ×2 IMPLANT
SPONGE LAP 18X18 X RAY DECT (DISPOSABLE) ×4 IMPLANT
SPONGE LAP 4X18 X RAY DECT (DISPOSABLE) ×2 IMPLANT
STAPLER ECHELON LONG 60 440 (INSTRUMENTS) ×2 IMPLANT
STAPLER RELOAD GREEN 60MM (STAPLE) ×18
STAPLER RELOAD WHITE 60MM (STAPLE) ×22
STENT SET URETHERAL LEFT 7FR (STENTS) ×2 IMPLANT
STENT SET URETHERAL RIGHT 7FR (STENTS) ×2 IMPLANT
SURGIFLO W/THROMBIN 8M KIT (HEMOSTASIS) IMPLANT
SUT CHROMIC 4 0 RB 1X27 (SUTURE) ×2 IMPLANT
SUT ETHILON 3 0 PS 1 (SUTURE) ×2 IMPLANT
SUT MNCRL AB 4-0 PS2 18 (SUTURE) ×4 IMPLANT
SUT PDS AB 0 CTX 36 PDP370T (SUTURE) ×6 IMPLANT
SUT SILK 3 0 SH 30 (SUTURE) ×1 IMPLANT
SUT SILK 3 0 SH CR/8 (SUTURE) ×2 IMPLANT
SUT VIC AB 2-0 SH 18 (SUTURE) IMPLANT
SUT VIC AB 2-0 UR5 27 (SUTURE) ×8 IMPLANT
SUT VIC AB 3-0 SH 27 (SUTURE) ×8
SUT VIC AB 3-0 SH 27X BRD (SUTURE) ×1 IMPLANT
SUT VIC AB 3-0 SH 27XBRD (SUTURE) ×1 IMPLANT
SUT VIC AB 4-0 RB1 27 (SUTURE) ×8
SUT VIC AB 4-0 RB1 27XBRD (SUTURE) ×4 IMPLANT
SUT VLOC BARB 180 ABS3/0GR12 (SUTURE) ×2
SUTURE VLOC BRB 180 ABS3/0GR12 (SUTURE) ×1 IMPLANT
SYR CONTROL 10ML LL (SYRINGE) IMPLANT
SYSTEM UROSTOMY GENTLE TOUCH (WOUND CARE) ×2 IMPLANT
TOWEL OR NON WOVEN STRL DISP B (DISPOSABLE) ×2 IMPLANT
TROCAR BLADELESS 15MM (ENDOMECHANICALS) ×2 IMPLANT
TUBING CONNECTING 10 (TUBING) IMPLANT
VESSEL LOOPS MINI RED (MISCELLANEOUS)
WATER STERILE IRR 1000ML POUR (IV SOLUTION) ×4 IMPLANT
WATER STERILE IRR 3000ML UROMA (IV SOLUTION) ×2 IMPLANT
YANKAUER SUCT BULB TIP 10FT TU (MISCELLANEOUS) ×1 IMPLANT

## 2017-06-29 NOTE — Anesthesia Postprocedure Evaluation (Signed)
Anesthesia Post Note  Patient: CRAIG IONESCU  Procedure(s) Performed: ROBOT ASSISTED LAPAROSCOPIC RADICAL CYSTOPROSTATECTOMY BILATERAL PELVIC LYMPHADENECTOMY,ILEAL CONDUIT (N/A ) CYSTOSCOPY WITH INJECTION OF INDOCYANINE GREEN DYE (N/A )     Patient location during evaluation: PACU Anesthesia Type: General Level of consciousness: awake and alert Pain management: pain level controlled Vital Signs Assessment: post-procedure vital signs reviewed and stable Respiratory status: spontaneous breathing, nonlabored ventilation and respiratory function stable Cardiovascular status: blood pressure returned to baseline and stable Postop Assessment: no apparent nausea or vomiting Anesthetic complications: no    Last Vitals:  Vitals:   06/29/17 1500 06/29/17 1515  BP: (!) 144/80 (!) 159/84  Pulse: 80 81  Resp: 17 18  Temp:    SpO2: 100% 98%    Last Pain:  Vitals:   06/29/17 1512  TempSrc:   PainSc: 3                  Audry Pili

## 2017-06-29 NOTE — Anesthesia Procedure Notes (Signed)
Procedure Name: Intubation Date/Time: 06/29/2017 9:12 AM Performed by: Lissa Morales, CRNA Pre-anesthesia Checklist: Patient identified, Emergency Drugs available, Suction available and Patient being monitored Patient Re-evaluated:Patient Re-evaluated prior to induction Oxygen Delivery Method: Circle system utilized Preoxygenation: Pre-oxygenation with 100% oxygen Induction Type: IV induction Ventilation: Mask ventilation without difficulty Laryngoscope Size: Mac and 4 Grade View: Grade II Tube type: Oral Tube size: 8.0 mm Number of attempts: 1 Airway Equipment and Method: Stylet and Oral airway Placement Confirmation: ETT inserted through vocal cords under direct vision,  positive ETCO2 and breath sounds checked- equal and bilateral Secured at: 22 cm Tube secured with: Tape Dental Injury: Teeth and Oropharynx as per pre-operative assessment

## 2017-06-29 NOTE — Progress Notes (Signed)
Day of Surgery   Subjective/Chief Complaint:   1 - Muscle Invasive Bladder Cancer - T2G3 bladder caner by TURBT 03/2017. Staging CT localized but with significant left Hutch bladder diverticulum. Admitted 11/13 for pre-op labs, stomal marking, bowel prep in preparation for curative intent cystectomy 11/14.    Today " [name] " is seen to proceed with cystectomy. He completed bowel prep to clear and had stomal marking. Hgb and Cr acceptable.      Objective: Vital signs in last 24 hours: Temp:  [97.6 F (36.4 C)-98.5 F (36.9 C)] 98.5 F (36.9 C) (11/14 0500) Pulse Rate:  [78-81] 78 (11/14 0500) Resp:  [16] 16 (11/14 0500) BP: (136-152)/(67-91) 136/67 (11/14 0500) SpO2:  [95 %-100 %] 100 % (11/14 0500) Weight:  [81.6 kg (179 lb 14.3 oz)] 81.6 kg (179 lb 14.3 oz) (11/13 1127)    Intake/Output from previous day: No intake/output data recorded. Intake/Output this shift: No intake/output data recorded.  General appearance: alert, cooperative, appears stated age and wife and family at bedside Eyes: negative Nose: Nares normal. Septum midline. Mucosa normal. No drainage or sinus tenderness. Throat: lips, mucosa, and tongue normal; teeth and gums normal Neck: supple, symmetrical, trachea midline Back: symmetric, no curvature. ROM normal. No CVA tenderness. Resp: non-labored on room air.  Chest wall: no tenderness Cardio: Nl rate GI: soft, non-tender; bowel sounds normal; no masses,  no organomegaly and stomal marking site noted Rt side just above level of umbilicus Extremities: extremities normal, atraumatic, no cyanosis or edema Skin: Skin color, texture, turgor normal. No rashes or lesions Lymph nodes: Cervical, supraclavicular, and axillary nodes normal. Neurologic: Grossly normal  Lab Results:  Recent Labs    06/28/17 1200  WBC 6.8  HGB 12.4*  HCT 36.1*  PLT 175   BMET Recent Labs    06/28/17 1200  NA 145  K 3.7  CL 112*  CO2 28  GLUCOSE 105*  BUN 12   CREATININE 1.10  CALCIUM 8.6*   PT/INR No results for input(s): LABPROT, INR in the last 72 hours. ABG No results for input(s): PHART, HCO3 in the last 72 hours.  Invalid input(s): PCO2, PO2  Studies/Results: No results found.  Anti-infectives: Anti-infectives (From admission, onward)   Start     Dose/Rate Route Frequency Ordered Stop   06/28/17 1400  neomycin (MYCIFRADIN) tablet 1,000 mg     1,000 mg Oral Every 8 hours 06/28/17 1146 06/29/17 0606   06/28/17 1400  metroNIDAZOLE (FLAGYL) tablet 500 mg     500 mg Oral Every 8 hours 06/28/17 1146 06/29/17 0606   06/28/17 1200  piperacillin-tazobactam (ZOSYN) IVPB 3.375 g     3.375 g 100 mL/hr over 30 Minutes Intravenous 30 min pre-op 06/28/17 1132        Assessment/Plan:  Proceed as planned with cystoprostatectomy. Risks, benefits, alternatives, expected peri-op course discussed extensively with patient and family previously and reiterated today.    [facility], [name] 06/29/2017

## 2017-06-29 NOTE — Transfer of Care (Signed)
Immediate Anesthesia Transfer of Care Note  Patient: [name]  Procedure(s) Performed: ROBOT ASSISTED LAPAROSCOPIC RADICAL CYSTOPROSTATECTOMY BILATERAL PELVIC LYMPHADENECTOMY,ILEAL CONDUIT (N/A ) CYSTOSCOPY WITH INJECTION OF INDOCYANINE GREEN DYE (N/A )  Patient Location: PACU  Anesthesia Type:General  Level of Consciousness: awake, alert , oriented and patient cooperative  Airway & Oxygen Therapy: Patient Spontanous Breathing and Patient connected to face mask oxygen  Post-op Assessment: Report given to RN, Post -op Vital signs reviewed and stable and Patient moving all extremities X 4  Post vital signs: stable  Last Vitals:  Vitals:   06/28/17 2102 06/29/17 0500  BP: (!) 152/91 136/67  Pulse: 81 78  Resp: 16 16  Temp: 36.8 C 36.9 C  SpO2: 99% 100%    Last Pain:  Vitals:   06/29/17 0500  TempSrc: Oral  PainSc:       Patients Stated Pain Goal: 2 (49/75/30 0511)  Complications: No apparent anesthesia complications

## 2017-06-29 NOTE — Brief Op Note (Signed)
06/29/2017  5:39 PM  PATIENT:  [name]  76 y.o. male  PRE-OPERATIVE DIAGNOSIS:  BLADDER CANCER  POST-OPERATIVE DIAGNOSIS:  BLADDER CANCER  PROCEDURE:  Procedure(s): ROBOT ASSISTED LAPAROSCOPIC RADICAL CYSTOPROSTATECTOMY BILATERAL PELVIC LYMPHADENECTOMY,ILEAL CONDUIT (N/A) CYSTOSCOPY WITH INJECTION OF INDOCYANINE GREEN DYE (N/A)  SURGEON:  Surgeon(s) and Role:    Alexis Frock, MD - Primary  PHYSICIAN ASSISTANT:   ASSISTANTS: Clemetine Marker PA   ANESTHESIA:   local and general  EBL:  250 mL   BLOOD ADMINISTERED:none  DRAINS: RLQ Urostomy with Rt (red) and Lt (blue) bander stents, JP   LOCAL MEDICATIONS USED:  MARCAINE     SPECIMEN:  Source of Specimen:  cystoprostatectomy, ureteral margins, pelvic lymph nodes.   DISPOSITION OF SPECIMEN:  PATHOLOGY  COUNTS:  YES  TOURNIQUET:  * No tourniquets in log *  DICTATION: .Other Dictation: Dictation Number [id number]  PLAN OF CARE: Admit to inpatient   PATIENT DISPOSITION:  PACU - hemodynamically stable.   Delay start of Pharmacological VTE agent (>24hrs) due to surgical blood loss or risk of bleeding: yes

## 2017-06-29 NOTE — Op Note (Signed)
NAMECREGG, JUTTE NO.:  [id number]  MEDICAL RECORD NO.:  [id number]  LOCATION:  1308                         FACILITY:  [facility]  PHYSICIAN:  Alexis Frock, MD     DATE OF BIRTH:  May 01, 1941  DATE OF PROCEDURE: 06/29/2017                              OPERATIVE REPORT   DIAGNOSIS:  High-grade bladder cancer, muscle invasive.  PROCEDURES: 1. Cystoscopy with indocyanine green dye injection. 2. Robotic-assisted laparoscopic radical cystoprostatectomy with ileal     conduit, urinary diversion and pelvic lymphadenectomy.  ESTIMATED BLOOD LOSS:  200 mL.  COMPLICATION:  None.  SPECIMENS: 1. Right distal ureteral margin. 2. Left distal ureteral margin. 3. Right external iliac lymph nodes. 4. Right common iliac lymph nodes. 5. Right obturator lymph nodes. 6. Left external iliac lymph nodes. 7. Left obturator lymph nodes. 8. Left common iliac lymph nodes. 9. Cystoprostatectomy.  ASSISTANT:  Debbrah Alar, PA.  FINDINGS: 1. Sentinel lymph nodes seen in bilateral pelvis noted as per     pathology. 2. Some volume residual intraluminal tumor by cystoscopy, mostly on     the left side. 3. Mildly adherent left lateral bladder wall plane as expected given     left-sided tumor.  DRAINS: 1. Jackson-Pratt drain to bulb suction. 2. Right lower quadrant urostomy with Bander stents; right is red,     left is blue to gravity drainage.  INDICATION:  Mr. Andrada is a very pleasant and quite vigorous 76 year old gentleman with known history of high-grade superficial bladder cancer times several years.  He is status post BCG induction.  He has had recurrence of this and is now muscle invasive with some perivesical stranding worrisome for possibly even T3 disease.  No obvious locally advanced or distant disease.  He underwent a curative intent in the past with neoadjuvant chemo with plan for cystoprostatectomy; however, he only tolerated one cycle of chemo and the symptoms  from urinary resection were impressive, he wished to proceed with cystectomy at that time.  He was referred for consideration and found to be a suitable candidate and he wished to proceed with conduit diversion.  Informed consent was obtained and placed in the medical record.  Notably, he was admitted one day preop for final labs, bowel preparation, stomal marking.  PROCEDURE IN UNTIL:  The patient being Torez Beauregard, was verified. Procedure being cystoscopy with indocyanine green dye injection, robotic- assisted cystoprostatectomy with ileal conduit, urinary diversion and pelvic lymphadenectomy was confirmed.  Procedure was carried out.  Time- out was confirmed.  Intravenous antibiotics were administered.  General endotracheal anesthesia was introduced.  The patient was placed into a low lithotomy position after his arm was tucked to the side.  He was further fashioned to the operating table using 3-inch tape over foam padding across his supraxiphoid chest.  A test of steep Trendelenburg positioning was performed, he was found to be suitably positioned. Sterile field was created by prepping and draping the patient's penis, perineum and proximal thighs using iodine in his infra-xiphoid abdomen using chlorhexidine gluconate.  Cystourethroscopy was performed using a 24-French injection scope set.  Inspection of the anterior and posterior urethra was unremarkable.  Inspection of the urinary bladder  revealed some residual papillary and nodular tumor at the area of the left bladder neck as well as large Hutch diverticulum as anticipated.  The edges of this were infiltrated with blebs of indocyanine green dye, 2 mL total.  Foley catheter was then placed per urethra to straight drain. Next, a high-flow, low-pressure pneumoperitoneum was obtained using Veress technique in the supraumbilical midline having passed the aspiration and drop test.  An 8-mm robotic camera port was then placed in the  same location.  Laparoscopic examination of the peritoneal cavity revealed no significant adhesions and no visceral injury.  Additional ports were placed as follows:  Right paramedian 8-mm robotic port, right far lateral 12-mm AirSeal assistant port, right paramedian 15-mm assistant port at the previously-marked stomal site, left paramedian 8- mm robotic port, left far lateral 8-mm robotic port.  Robot was docked and passed through the electronic checks.  Initial attention was directed at development of the left retroperitoneum.  Incision was made lateral to the descending colon from the area of the iliac vessels, distally lateral to the left medial umbilical ligament towards the area of vas deferens and inguinal ring, and then proximally towards the area of the diaphragm.  Posterior peritoneal flap was developed and carefully rotated medially.  The ureter was identified as it coursed over the iliac vessels and marked the vessel loop, dissected distally to the ureterovesical junction, was doubly clipped and ligated.  Marked with a dye-colored tag suture and proximally for distance approximately 8 cm above the iliac crossing, and the ureter was set into position above the iliac vessels.  Attention was directed at left-sided pelvic lymphadenectomy.  The left pelvis was inspected under near infrared fluorescent light.  There were obvious sentinel lymph nodes noted within the left external iliac group.  As such, lymphadenectomy was performed first at the left external iliac group with confines being left external iliac artery, vein, pelvic side wall, iliac bifurcation.  Lymphostasis was achieved with cold clips, set aside, labeled left external iliac lymph node, sentinel.  Next, left obturator group was dissected with confines being left external iliac vein, pelvic sidewall, obturator nerve.  Lymphostasis was achieved with cold clips, set aside, labeled left obturator lymph nodes, and left  obturator nerve was inspected following these maneuvers and found to be intact.  Next, fibrofatty tissue with confines of the left common iliac vessels from the aortic bifurcation, the iliac bifurcation was carefully mobilized. Lymphostasis was achieved with cold clips, set aside, labeled left common iliac lymph nodes.  The endopelvic fascia was carefully swept away from the lateral aspect of the prostate in a base-to-apex orientation on the left side.  Notably, there was some desmoplastic reaction as anticipated to the area of the bladder neck, and purposely wide dissection was performed in order to achieve visibly negative margin in this area.  Similarly, the right retroperitoneum was developed by incising lateral to the ascending colon including peritoneal flap inferiorly over the iliac vessels lateral to the right medial umbilical ligament, this carefully swept medially.  The right ureter was encountered as it crossed the iliac vessels, dissected distally to the ureterovesical junction, was doubly clipped and ligated, tagged with a white suture and proximally for distance approximately 4 cm above the iliac crossing.  Frozen section was negative for carcinoma.  Right-sided pelvic lymphadenectomy was performed of the right external iliac, obturator and common iliac lymph nodes respectively.  There were multiple sentinel lymph nodes noted as per pathology.  The right endopelvic fascia was swept  away from the lateral aspect of the prostate in a base-to-apex orientation.  Next, posterior flap was performed by connecting the two previous peritoneal incisions and creating a posterior flap in a plane just inferior to the vas deferens and seminal vesicles toward the area of the apex of the prostate.  This exposed the vascular pedicles, which were carefully controlled using vascular load stapler x2 each side.  Anterior attachments were taken down using cautery scissors.  This exposed the  anterior base of the prostate, dorsal venous complex, which was also controlled using vascular load stapler.  Final apical dissection was performed in the anterior plane. The membranous urethra was coldly transected and the proximal Foley catheter was clipped, this completely freed up the cystoprostatectomy specimen, was placed into an extra-large EndoCatch bag for later retrieval.  Digital rectal exam was then performed under laparoscopic vision.  No evidence of rectal violation was noted.  The membranous urethral stump was oversewn using a 3-0 V-Loc suture to prevent copious drainage.  Next, posterior peritoneal window was created from the right to the left side just above the area of the aortic bifurcation, and the left ureter was brought through the retroperitoneal tunnel to the area of the right side.  The ileocecal junction was identified and the area of distal ileum approximately 15 cm proximal to this was marked using a tagged silk suture.  All tag sutures were then marked with a laparoscopic grasper.  The specimen bag string was brought through the left paramedian robotic port site and closed suction drain was brought through the previous left lateral most robotic port site near the peritoneal cavity.  Robot was then undocked.  Specimen was retrieved by extending the previous camera port site inferiorly for distance approximately 6 cm, removing the cystoprostatectomy specimen, setting it aside for permanent pathology.  A wound protector was then deployed to this site.  Attention was directed to the urinary diversion.  Both ureters were brought up through this area, appeared to be a suitable length.  The previously-marked tagged bowel was brought the site as well, and a segment of distal ileum approximately 15 cm in length was taken out of continuity using bowel load stapler, taken exquisite care to maintain proximal-distal orientation, this was lined to the retroperitoneal  orientation and bowel-bowel anastomosis was performed using side-to-side stapling x2 of green load stapler.  The free end was oversewn using running silk with a second imbricating layer of running silk.  The acute angle of the anastomosis was buttressed using interrupted silk and the mesenteric defect closed using interrupted silk.  The proximal staple line was excluded using running Vicryl. Attention was directed at ureteroureteral anastomosis first on the right side.  Segment of approximately 4 mm in length of bowel serosa and mucosa was excised.  Four mucosal everting sutures of 4-0 Vicryl were applied.  Final distal ureteral margin was sent, the heel stitch of interrupted Vicryl was placed and a red-colored bander stent was brought 26 cm to anastomosis and ureteroileal anastomosis was further performed using two separate running suture lines of 4-0 Vicryl, which resulted excellent patency without undue tension.  Similarly, left ureteroileal anastomosis was performed in exact same fashion as per the right using a blue-colored Bander stent 26 cm to anastomosis.  Each one of the stent was sewn in place using a separate interrupted silk suture to prevent inadvertent displacement.  Attention was directed to maturation of the stoma, a quarter-sized column of skin and subcutaneous fat were excised from the  area of the prior stomal site and the 15-mm assistant port site.  The fascia was dilated to accommodate two surgeon's fingers and four fascial stay sutures were applied of 4-0 Vicryl, and the conduit was brought through this.  The fascial stay suture was fashioned to the conduit to prevent peristomal hernia.  Next, four rose-budding sutures of 0 Vicryl were applied, resulted in excellent rose-budding of the stoma.  An intervening interrupted Vicryl was used from the skin to the bowel edge.  All surgeons and assistants then changed gloves.  Attention was directed at closure.  The fascia was  closed using figure-of-eight PDS x6, reapproximation of Scarpa's with running Vicryl.  All incision sites were infiltrated with dilute lyophilized Marcaine and closed at the level of the skin using subcuticular Monocryl followed by Dermabond. Procedure was then terminated.  The patient tolerated the procedure well.  There were no immediate periprocedural complications.  The patient was taken to the postanesthesia care unit in stable condition.          ______________________________ Alexis Frock, MD    TM/MEDQ  D:  06/29/2017  T:  06/29/2017  Job:  [id number]

## 2017-06-30 ENCOUNTER — Encounter (HOSPITAL_COMMUNITY): Payer: Self-pay | Admitting: Urology

## 2017-06-30 LAB — CBC
HCT: 34.1 % — ABNORMAL LOW (ref 39.0–52.0)
Hemoglobin: 11.6 g/dL — ABNORMAL LOW (ref 13.0–17.0)
MCH: 35.5 pg — ABNORMAL HIGH (ref 26.0–34.0)
MCHC: 34 g/dL (ref 30.0–36.0)
MCV: 104.3 fL — ABNORMAL HIGH (ref 78.0–100.0)
Platelets: 231 10*3/uL (ref 150–400)
RBC: 3.27 MIL/uL — ABNORMAL LOW (ref 4.22–5.81)
RDW: 13.4 % (ref 11.5–15.5)
WBC: 13.6 10*3/uL — ABNORMAL HIGH (ref 4.0–10.5)

## 2017-06-30 LAB — BASIC METABOLIC PANEL
Anion gap: 6 (ref 5–15)
BUN: 20 mg/dL (ref 6–20)
CO2: 25 mmol/L (ref 22–32)
Calcium: 8.1 mg/dL — ABNORMAL LOW (ref 8.9–10.3)
Chloride: 110 mmol/L (ref 101–111)
Creatinine, Ser: 1.34 mg/dL — ABNORMAL HIGH (ref 0.61–1.24)
GFR calc Af Amer: 58 mL/min — ABNORMAL LOW (ref >60)
GFR calc non Af Amer: 50 mL/min — ABNORMAL LOW (ref >60)
Glucose, Bld: 149 mg/dL — ABNORMAL HIGH (ref 65–99)
Potassium: 3.9 mmol/L (ref 3.5–5.1)
Sodium: 141 mmol/L (ref 135–145)

## 2017-06-30 MED ORDER — ENOXAPARIN SODIUM 40 MG/0.4ML ~~LOC~~ SOLN
40.0000 mg | SUBCUTANEOUS | Status: DC
  Administered 2017-06-30 – 2017-07-04 (×5): 40 mg via SUBCUTANEOUS
  Filled 2017-06-30 (×5): qty 0.4

## 2017-06-30 NOTE — Consult Note (Addendum)
[facility] Nurse ostomy consult note Stoma type/location: RUQ ileal conduit Stomal assessment/size: 1 and 5/8 inches round, red, raised, edematous.  Os at center. Two stents intact:  Red = right, blue = left Peristomal assessment: intact, clear Treatment options for stomal/peristomal skin: skin barrier ring Output; clear amber urine Ostomy pouching: 1pc.convex urostomy pouch with skin barrier ring Education provided: Patient and wife participating in session, grandson in room, but not a willing or able caregiver.  Patient's wife's minister in room to offer prayer, all participate, include this Probation officer. Minister leaves prior to session.  Patient and wife taught pouching system removal using a warm, moist washcloth and we together inspect the back of the skin barrier so that they can see how a skin barrier wears (out).  Stoma measured using sizing guide and patient and wife are taught that the ostomy will shrink gradually over the next several weeks (and then a small amount over the next few months) as edema subsides.  All indicate understanding.  They are taught to periodically measure the ostomy to ensure a good fit of the pouching system. Patient and wife observe pouch preparation and it is stressed that when the ostomy is the same size for several weeks in a row, they can order their pouches pre-cut. The pouch is applied over a skin barrier ring and the purpose of the ring is taught. I demonstrate the pouch closure mechanism (spout) and the attachment to the bedside urinary drainage bag. I will ask one of my partners to visit tomorrow to have the patient practice attaching and disconnecting to the bedside urinary drainage bag.  I today provide a catheter securing strap and demonstrate its use. Enrolled patient in [place] program: No  WOC nursing team will follow, and will remain available to this patient, the nursing and medical teams.   Thank  You for including [place] in this nice patient's care  team.  Maudie Flakes, MSN, RN, [place], Arther Abbott  Pager# [phone]

## 2017-06-30 NOTE — Progress Notes (Signed)
Patient walked 20 feet in hall  .Transferred to [id number] via wheelchair .Report given to RN.

## 2017-06-30 NOTE — Evaluation (Signed)
Physical Therapy Evaluation Patient Details Name: [name] MRN: [id number] DOB: [birth year] Today's Date: 06/30/2017   History of Present Illness  76 YO male admitted for bladder cancer, s/p robotic cystoprostatectomy with ICG sentinal + template pelvic lymphadenectomy and ilial conduit diversion on 06/29/17  Clinical Impression  *the patient ambulated x 120' , barely held onto RW. Pt admitted with above diagnosis. Pt currently with functional limitations due to the deficits listed below (see PT Problem List).  Pt will benefit from skilled PT to increase their independence and safety with mobility to allow discharge to the venue listed below.       Follow Up Recommendations No PT follow up    Equipment Recommendations  None recommended by PT    Recommendations for Other Services       Precautions / Restrictions Precautions Precautions: Fall Precaution Comments: left drain, , RUQ conduit drain      Mobility  Bed Mobility Overal bed mobility: Needs Assistance Bed Mobility: Supine to Sit;Sit to Supine     Supine to sit: Min guard Sit to supine: Min guard   General bed mobility comments: cues for rolling and abdominal protection  Transfers Overall transfer level: Needs assistance Equipment used: Rolling walker (2 wheeled) Transfers: Sit to/from Stand Sit to Stand: Min guard            Ambulation/Gait Ambulation/Gait assistance: Min guard Ambulation Distance (Feet): 120 Feet Assistive device: Rolling walker (2 wheeled) Gait Pattern/deviations: Step-through pattern     General Gait Details: barely held onto the RW once ambulating.  Stairs            Wheelchair Mobility    Modified Rankin (Stroke Patients Only)       Balance                                             Pertinent Vitals/Pain Pain Assessment: 0-10 Pain Score: 3  Pain Location: abdomen Pain Descriptors / Indicators: Discomfort Pain Intervention(s):  Monitored during session;PCA encouraged    Home Living Family/patient expects to be discharged to:: Private residence Living Arrangements: Spouse/significant other Available Help at Discharge: Family Type of Home: House Home Access: Stairs to enter   Technical brewer of Steps: 2 Home Layout: One level Home Equipment: None      Prior Function Level of Independence: Independent               Hand Dominance        Extremity/Trunk Assessment   Upper Extremity Assessment Upper Extremity Assessment: Overall WFL for tasks assessed    Lower Extremity Assessment Lower Extremity Assessment: Generalized weakness    Cervical / Trunk Assessment Cervical / Trunk Assessment: Normal  Communication   Communication: No difficulties  Cognition Arousal/Alertness: Awake/alert Behavior During Therapy: Flat affect Overall Cognitive Status: Within Functional Limits for tasks assessed                                        General Comments      Exercises     Assessment/Plan    PT Assessment Patient needs continued PT services  PT Problem List Decreased activity tolerance;Decreased mobility;Pain       PT Treatment Interventions Gait training;Functional mobility training    PT Goals (Current goals can be found  in the Care Plan section)  Acute Rehab PT Goals Patient Stated Goal: to walk without walker PT Goal Formulation: With patient Time For Goal Achievement: 07/14/17 Potential to Achieve Goals: Good    Frequency Min 3X/week   Barriers to discharge        Co-evaluation               AM-PAC PT "6 Clicks" Daily Activity  Outcome Measure Difficulty turning over in bed (including adjusting bedclothes, sheets and blankets)?: A Little Difficulty moving from lying on back to sitting on the side of the bed? : A Little Difficulty sitting down on and standing up from a chair with arms (e.g., wheelchair, bedside commode, etc,.)?: A Little Help  needed moving to and from a bed to chair (including a wheelchair)?: A Little Help needed walking in hospital room?: A Little Help needed climbing 3-5 steps with a railing? : A Little 6 Click Score: 18    End of Session   Activity Tolerance: Patient tolerated treatment well Patient left: in bed;with call bell/phone within reach;with family/visitor present Nurse Communication: Mobility status PT Visit Diagnosis: Difficulty in walking, not elsewhere classified (R26.2);Pain Pain - Right/Left: Right    Time: 0092-3300 PT Time Calculation (min) (ACUTE ONLY): 15 min   Charges:   PT Evaluation $PT Eval Low Complexity: 1 Low     PT G CodesTresa Endo PT [phone]   Claretha Cooper 06/30/2017, 4:16 PM

## 2017-06-30 NOTE — Progress Notes (Signed)
1 Day Post-Op   Subjective/Chief Complaint:  1 - Bladder Cancer - s/p robotic cystoprostatectomy with ICG sentinal + template pelvic lymphadenectomy and ilial conduit diversion on 06/29/17. Admitted day prior for bowel prep. Path pending. Stepdown POD 0.  2 - Return of Bowel Function - small bowel anastamosis at time of surgery as part of urninary diversion. Received entered peri-op. NPO initilallya post-op.  3 - Disposition / Rehab - independent at baseline with excellent functional status. Ostomy RN comanaging new urostomy in house.   Today "Jospeh" is stable. Hgb 11's, Cr <1.5. No fevers or emesis.    Objective: Vital signs in last 24 hours: Temp:  [97.4 F (36.3 C)-98.4 F (36.9 C)] 97.8 F (36.6 C) (11/15 0300) Pulse Rate:  [73-87] 73 (11/15 0600) Resp:  [9-25] 15 (11/15 0741) BP: (141-179)/(77-118) 179/87 (11/15 0600) SpO2:  [97 %-100 %] 97 % (11/15 0741) Weight:  [83.1 kg (183 lb 3.2 oz)] 83.1 kg (183 lb 3.2 oz) (11/14 1545) Last BM Date: 06/29/17  Intake/Output from previous day: 11/14 0701 - 11/15 0700 In: 4902.5 [I.V.:4802.5; IV Piggyback:100] Out: 2230 [Urine:1275; Drains:705; Blood:250] Intake/Output this shift: No intake/output data recorded.  General appearance: alert, cooperative and appears stated age Eyes: negative Nose: Nares normal. Septum midline. Mucosa normal. No drainage or sinus tenderness. Throat: lips, mucosa, and tongue normal; teeth and gums normal Neck: supple, symmetrical, trachea midline Back: symmetric, no curvature. ROM normal. No CVA tenderness. Resp: non-labored on minimal [place] O2.  Cardio: Nl rate GI: soft, non-tender; bowel sounds normal; no masses,  no organomegaly Male genitalia: No CVAT Extremities: extremities normal, atraumatic, no cyanosis or edema Pulses: 2+ and symmetric Skin: Skin color, texture, turgor normal. No rashes or lesions Neurologic: Grossly normal Incision/Wound: RLQ Urostomy pink / patent with Rt (red) and Lt  (blue) bander stents in situ. Recent port and extraction sites c/d/i. JP with serosanguinous fluid that is non-foul.   Lab Results:  Recent Labs    06/28/17 1200 06/29/17 1449 06/30/17 0334  WBC 6.8  --  13.6*  HGB 12.4* 12.9* 11.6*  HCT 36.1* 37.2* 34.1*  PLT 175  --  231   BMET Recent Labs    06/28/17 1200 06/30/17 0334  NA 145 141  K 3.7 3.9  CL 112* 110  CO2 28 25  GLUCOSE 105* 149*  BUN 12 20  CREATININE 1.10 1.34*  CALCIUM 8.6* 8.1*   PT/INR No results for input(s): LABPROT, INR in the last 72 hours. ABG No results for input(s): PHART, HCO3 in the last 72 hours.  Invalid input(s): PCO2, PO2  Studies/Results: No results found.  Anti-infectives: Anti-infectives (From admission, onward)   Start     Dose/Rate Route Frequency Ordered Stop   06/29/17 0821  piperacillin-tazobactam (ZOSYN) 3.375 GM/50ML IVPB    Comments:  Enrigue Catena   : cabinet override      06/29/17 0821 06/29/17 2029   06/28/17 1400  neomycin (MYCIFRADIN) tablet 1,000 mg     1,000 mg Oral Every 8 hours 06/28/17 1146 06/29/17 0606   06/28/17 1400  metroNIDAZOLE (FLAGYL) tablet 500 mg     500 mg Oral Every 8 hours 06/28/17 1146 06/29/17 0606   06/28/17 1200  piperacillin-tazobactam (ZOSYN) IVPB 3.375 g     3.375 g 100 mL/hr over 30 Minutes Intravenous 30 min pre-op 06/28/17 1132 06/29/17 0936      Assessment/Plan:  1 - Bladder Cancer - path pending.   2 - Return of Bowel Function - NPO with ice chips  for now.   3 - Disposition / Rehab - PT eval today. Appreciate ostomy RN team. He will need at least [facility] for new ostomy teaching / supplies at discharge.   [facility], Ranyah Groeneveld 06/30/2017

## 2017-07-01 LAB — CBC
HCT: 31.8 % — ABNORMAL LOW (ref 39.0–52.0)
Hemoglobin: 10.5 g/dL — ABNORMAL LOW (ref 13.0–17.0)
MCH: 35.4 pg — ABNORMAL HIGH (ref 26.0–34.0)
MCHC: 33 g/dL (ref 30.0–36.0)
MCV: 107.1 fL — ABNORMAL HIGH (ref 78.0–100.0)
Platelets: 204 10*3/uL (ref 150–400)
RBC: 2.97 MIL/uL — ABNORMAL LOW (ref 4.22–5.81)
RDW: 13.5 % (ref 11.5–15.5)
WBC: 11 10*3/uL — ABNORMAL HIGH (ref 4.0–10.5)

## 2017-07-01 LAB — BASIC METABOLIC PANEL
Anion gap: 4 — ABNORMAL LOW (ref 5–15)
BUN: 14 mg/dL (ref 6–20)
CO2: 29 mmol/L (ref 22–32)
Calcium: 8 mg/dL — ABNORMAL LOW (ref 8.9–10.3)
Chloride: 107 mmol/L (ref 101–111)
Creatinine, Ser: 1.16 mg/dL (ref 0.61–1.24)
GFR calc Af Amer: >60 mL/min (ref >60)
GFR calc non Af Amer: 59 mL/min — ABNORMAL LOW (ref >60)
Glucose, Bld: 87 mg/dL (ref 65–99)
Potassium: 3.3 mmol/L — ABNORMAL LOW (ref 3.5–5.1)
Sodium: 140 mmol/L (ref 135–145)

## 2017-07-01 MED ORDER — OXYCODONE HCL 5 MG PO TABS
5.0000 mg | ORAL_TABLET | ORAL | Status: DC | PRN
  Administered 2017-07-04 – 2017-07-05 (×4): 5 mg via ORAL
  Filled 2017-07-01 (×4): qty 1

## 2017-07-01 NOTE — Consult Note (Signed)
[facility] Nurse ostomy consult note Stoma type/location: RUQ ileal conduit Stomal assessment/size: Red, moist, raised. Measured yesterday. Red and Blue stents intact Peristomal assessment: unseen, pouch changed yesterday Treatment options for stomal/peristomal skin: skin barrier ring placed yesterday. Output; urine with dark red blood today Ostomy pouching: 1pc.convex urostomy pouch with skin barrier ring Education provided: Patient and his wife are participating today.  The pouch closure mechanism was taught yesterday. Patient is able to disconnect and reconnect on the second try. He is in a chair. Pt instructed to pay attention to amount in bag and empty when it is 1/3rd full.  Explained he will get a feel for what 1/3rd full feels like and it will become second nature.  A leg strap was added yesterday and pt is frustrated with it. I instructed pt on how to open and close valve without making the tubing twist. I believe that is the cause of the aggrevation. I have asked pt to disconnect from bedside drainage bag everytime he is out of bed and reconnect when getting back in.  I did instruct pt to leave urine in urinal so it could be measured by nursing staff. Patient and wife acknowledge and demonstrate understanding of of todays teaching. Urostomy book lost in transfer of room, provided new book.  Enrolled patient in [place] program: No We will follow this patient and remain available to this patient, nursing, and the medical and surgical teams.  Fara Olden, RN-C, WTA-C, OCA Wound Treatment Associate Ostomy Care Associate        Revision History

## 2017-07-01 NOTE — Progress Notes (Signed)
2 Days Post-Op   Subjective/Chief Complaint:   1 - Bladder Cancer - s/p robotic cystoprostatectomy with ICG sentinal + template pelvic lymphadenectomy and ilial conduit diversion on 06/29/17. Admitted day prior for bowel prep. Path pending. Stepdown POD 0, to med-surg floor POD 1.   2 - Return of Bowel Function - small bowel anastamosis at time of surgery as part of urninary diversion. Received entered peri-op. NPO initilallya post-op. Advanced to clears POD 2.   3 - Disposition / Rehab - independent at baseline with excellent functional status. Ostomy RN comanaging new urostomy in house. PT eval 11/15 feels no additional DME needs from PT perspective at discharge.   Today "Cutter" is stable.  No fevers or emesis. Doing very well with ambulation.    Objective: Vital signs in last 24 hours: Temp:  [97.7 F (36.5 C)-98.2 F (36.8 C)] 98.2 F (36.8 C) (11/16 0012) Pulse Rate:  [73-77] 75 (11/16 0012) Resp:  [10-17] 11 (11/16 0740) BP: (132-152)/(78-87) 132/78 (11/16 0012) SpO2:  [92 %-99 %] 93 % (11/16 0740) Last BM Date: 06/29/17  Intake/Output from previous day: 11/15 0701 - 11/16 0700 In: 2300 [I.V.:2300] Out: 1760 [Urine:1300; Drains:460] Intake/Output this shift: Total I/O In: -  Out: 40 [Drains:40]  General appearance: alert, cooperative and appears stated age Eyes: negative Nose: Nares normal. Septum midline. Mucosa normal. No drainage or sinus tenderness. Throat: lips, mucosa, and tongue normal; teeth and gums normal Neck: supple, symmetrical, trachea midline Back: symmetric, no curvature. ROM normal. No CVA tenderness. Resp: non-labored on minimal [place] O2.  Cardio: Nl rate GI: soft, non-tender; bowel sounds normal; no masses,  no organomegaly Male genitalia: No CVAT Extremities: extremities normal, atraumatic, no cyanosis or edema Pulses: 2+ and symmetric Skin: Skin color, texture, turgor normal. No rashes or lesions Neurologic: Grossly normal Incision/Wound: RLQ  Urostomy pink / patent with Rt (red) and Lt (blue) bander stents in situ. Recent port and extraction sites c/d/i. JP with serosanguinous fluid that is non-foul.    Lab Results:  Recent Labs    06/30/17 0334 07/01/17 0434  WBC 13.6* 11.0*  HGB 11.6* 10.5*  HCT 34.1* 31.8*  PLT 231 204   BMET Recent Labs    06/30/17 0334 07/01/17 0434  NA 141 140  K 3.9 3.3*  CL 110 107  CO2 25 29  GLUCOSE 149* 87  BUN 20 14  CREATININE 1.34* 1.16  CALCIUM 8.1* 8.0*   PT/INR No results for input(s): LABPROT, INR in the last 72 hours. ABG No results for input(s): PHART, HCO3 in the last 72 hours.  Invalid input(s): PCO2, PO2  Studies/Results: No results found.  Anti-infectives: Anti-infectives (From admission, onward)   Start     Dose/Rate Route Frequency Ordered Stop   06/29/17 0821  piperacillin-tazobactam (ZOSYN) 3.375 GM/50ML IVPB    Comments:  Enrigue Catena   : cabinet override      06/29/17 0821 06/29/17 2029   06/28/17 1400  neomycin (MYCIFRADIN) tablet 1,000 mg     1,000 mg Oral Every 8 hours 06/28/17 1146 06/29/17 0606   06/28/17 1400  metroNIDAZOLE (FLAGYL) tablet 500 mg     500 mg Oral Every 8 hours 06/28/17 1146 06/29/17 0606   06/28/17 1200  piperacillin-tazobactam (ZOSYN) IVPB 3.375 g     3.375 g 100 mL/hr over 30 Minutes Intravenous 30 min pre-op 06/28/17 1132 06/29/17 0936      Assessment/Plan:   1 - Bladder Cancer - path pending. No further cancer directed threrapy in house.  2 - Return of Bowel Function - Advance to clears today and continue until regular flatus or BM.   3 - Disposition / Rehab - He will need at least [facility] for new ostomy teaching / supplies at discharge, but likely no additional DME at this point.   [facility], Alandis Bluemel 07/01/2017

## 2017-07-01 NOTE — Progress Notes (Signed)
Physical Therapy Treatment Patient Details Name: [name] MRN: [id number] DOB: 23-Apr-1941 Today's Date: 07/01/2017    History of Present Illness 76 year old male admitted for bladder cancer, s/p robotic cystoprostatectomy with ICG sentinal + template pelvic lymphadenectomy and ilial conduit diversion on 06/29/17    PT Comments    Pt very agreeable to ambulate and progressing well.  Pt anticipates ambulating again 2 more times today.  Follow Up Recommendations  No PT follow up     Equipment Recommendations  None recommended by PT    Recommendations for Other Services       Precautions / Restrictions Precautions Precautions: Fall Precaution Comments: left drain, RUQ conduit drain    Mobility  Bed Mobility Overal bed mobility: Needs Assistance Bed Mobility: Supine to Sit     Supine to sit: Min guard     General bed mobility comments: min/guard for lines/drains  Transfers Overall transfer level: Needs assistance Equipment used: Rolling walker (2 wheeled) Transfers: Sit to/from Stand Sit to Stand: Min guard         General transfer comment: min/guard for safety  Ambulation/Gait Ambulation/Gait assistance: Min guard Ambulation Distance (Feet): 400 Feet Assistive device: Rolling walker (2 wheeled) Gait Pattern/deviations: Step-through pattern     General Gait Details: pt steady with RW, utilized RW due to multiple lines however may attempt without next session, pt denies any symptoms   Stairs            Wheelchair Mobility    Modified Rankin (Stroke Patients Only)       Balance                                            Cognition Arousal/Alertness: Awake/alert Behavior During Therapy: Flat affect Overall Cognitive Status: Within Functional Limits for tasks assessed                                        Exercises      General Comments        Pertinent Vitals/Pain Pain Assessment: 0-10 Pain  Score: 4  Pain Location: abdomen Pain Descriptors / Indicators: Discomfort Pain Intervention(s): Monitored during session;PCA encouraged    Home Living                      Prior Function            PT Goals (current goals can now be found in the care plan section) Progress towards PT goals: Progressing toward goals    Frequency    Min 3X/week      PT Plan Current plan remains appropriate    Co-evaluation              AM-PAC PT "6 Clicks" Daily Activity  Outcome Measure  Difficulty turning over in bed (including adjusting bedclothes, sheets and blankets)?: A Little Difficulty moving from lying on back to sitting on the side of the bed? : A Little Difficulty sitting down on and standing up from a chair with arms (e.g., wheelchair, bedside commode, etc,.)?: A Little Help needed moving to and from a bed to chair (including a wheelchair)?: A Little Help needed walking in hospital room?: A Little Help needed climbing 3-5 steps with a railing? : A Little 6 Click Score:  18    End of Session   Activity Tolerance: Patient tolerated treatment well Patient left: with call bell/phone within reach;with family/visitor present;in chair Nurse Communication: Mobility status PT Visit Diagnosis: Difficulty in walking, not elsewhere classified (R26.2)     Time: 5396-7289 PT Time Calculation (min) (ACUTE ONLY): 14 min  Charges:  $Gait Training: 8-22 mins                    G Codes:      Carmelia Bake, PT, DPT 07/01/2017 Pager: [phone]  York Ram E 07/01/2017, 1:18 PM

## 2017-07-02 ENCOUNTER — Encounter (HOSPITAL_COMMUNITY): Payer: Self-pay

## 2017-07-02 LAB — BASIC METABOLIC PANEL
Anion gap: 6 (ref 5–15)
BUN: 8 mg/dL (ref 6–20)
CO2: 29 mmol/L (ref 22–32)
Calcium: 8.2 mg/dL — ABNORMAL LOW (ref 8.9–10.3)
Chloride: 105 mmol/L (ref 101–111)
Creatinine, Ser: 1.01 mg/dL (ref 0.61–1.24)
GFR calc Af Amer: >60 mL/min (ref >60)
GFR calc non Af Amer: >60 mL/min (ref >60)
Glucose, Bld: 97 mg/dL (ref 65–99)
Potassium: 3 mmol/L — ABNORMAL LOW (ref 3.5–5.1)
Sodium: 140 mmol/L (ref 135–145)

## 2017-07-02 LAB — CBC
HCT: 30.9 % — ABNORMAL LOW (ref 39.0–52.0)
Hemoglobin: 10.5 g/dL — ABNORMAL LOW (ref 13.0–17.0)
MCH: 35.7 pg — ABNORMAL HIGH (ref 26.0–34.0)
MCHC: 34 g/dL (ref 30.0–36.0)
MCV: 105.1 fL — ABNORMAL HIGH (ref 78.0–100.0)
Platelets: 206 10*3/uL (ref 150–400)
RBC: 2.94 MIL/uL — ABNORMAL LOW (ref 4.22–5.81)
RDW: 13.2 % (ref 11.5–15.5)
WBC: 9 10*3/uL (ref 4.0–10.5)

## 2017-07-02 LAB — TYPE AND SCREEN
ABO/RH(D): A POS
Antibody Screen: NEGATIVE
Unit division: 0
Unit division: 0

## 2017-07-02 LAB — BPAM RBC
Blood Product Expiration Date: 201811272359
Blood Product Expiration Date: 201811292359
Unit Type and Rh: 6200
Unit Type and Rh: 6200

## 2017-07-02 NOTE — Progress Notes (Signed)
3 Days Post-Op Subjective: Patient reports tolerating PO.  He is currently on a clear liquid diet but has not had any nausea or vomiting.  He reports having small amount of flatus but no BM yet.  He has no pain and overall seems to be feeling well.  Objective: Vital signs in last 24 hours: Temp:  [98.1 F (36.7 C)-99.1 F (37.3 C)] 98.1 F (36.7 C) (11/17 0441) Pulse Rate:  [79-84] 79 (11/17 0441) Resp:  [11-21] 21 (11/17 0441) BP: (136-146)/(68-78) 146/69 (11/17 0441) SpO2:  [93 %-98 %] 98 % (11/17 0441)  Intake/Output from previous day: 11/16 0701 - 11/17 0700 In: 2640 [P.O.:240; I.V.:2400] Out: 1725 [Urine:1400; Drains:325] Intake/Output this shift: No intake/output data recorded.  Physical Exam:  General:alert, cooperative and no distress GI: not done and soft, non tender, no palpable masses, no organomegaly, no inguinal hernia His ileostomy appears pink and viable with stents exiting and draining clear urine. Incisions are dry, sealed and have no sign of infection.  Lab Results: Recent Labs    06/30/17 0334 07/01/17 0434 07/02/17 0504  HGB 11.6* 10.5* 10.5*  HCT 34.1* 31.8* 30.9*   BMET Recent Labs    07/01/17 0434 07/02/17 0504  NA 140 140  K 3.3* 3.0*  CL 107 105  CO2 29 29  GLUCOSE 87 97  BUN 14 8  CREATININE 1.16 1.01  CALCIUM 8.0* 8.2*   No results for input(s): LABPT, INR in the last 72 hours. No results for input(s): LABURIN in the last 72 hours. Results for orders placed or performed during the hospital encounter of 06/28/17  MRSA PCR Screening     Status: None   Collection Time: 06/28/17 12:07 PM  Result Value Ref Range Status   MRSA by PCR NEGATIVE NEGATIVE Final    Comment:        The GeneXpert MRSA Assay (FDA approved for NASAL specimens only), is one component of a comprehensive MRSA colonization surveillance program. It is not intended to diagnose MRSA infection nor to guide or monitor treatment for MRSA infections.      Studies/Results: No results found.  Assessment/Plan: Status post radical cystectomy: He is doing exceedingly well from his surgery 3 days ago.  His ileostomy looks good and his creatinine remains normal at 1.01.  H&H is stable at 10.5 and 30.9.  He has been tolerating clear liquid diet and has begun to have a small amount of flatus.  He has been ambulating and is currently having no difficulties and appears to be progressing quite nicely.  Advance to full liquids  Continue ambulation   LOS: 4 days   [name] C 07/02/2017, 7:36 AM

## 2017-07-03 ENCOUNTER — Encounter (HOSPITAL_COMMUNITY): Payer: Self-pay

## 2017-07-03 LAB — BASIC METABOLIC PANEL
Anion gap: 4 — ABNORMAL LOW (ref 5–15)
BUN: 6 mg/dL (ref 6–20)
CO2: 29 mmol/L (ref 22–32)
Calcium: 8 mg/dL — ABNORMAL LOW (ref 8.9–10.3)
Chloride: 105 mmol/L (ref 101–111)
Creatinine, Ser: 0.89 mg/dL (ref 0.61–1.24)
GFR calc Af Amer: >60 mL/min (ref >60)
GFR calc non Af Amer: >60 mL/min (ref >60)
Glucose, Bld: 108 mg/dL — ABNORMAL HIGH (ref 65–99)
Potassium: 3.2 mmol/L — ABNORMAL LOW (ref 3.5–5.1)
Sodium: 138 mmol/L (ref 135–145)

## 2017-07-03 LAB — CBC
HCT: 27.8 % — ABNORMAL LOW (ref 39.0–52.0)
Hemoglobin: 9.4 g/dL — ABNORMAL LOW (ref 13.0–17.0)
MCH: 35.3 pg — ABNORMAL HIGH (ref 26.0–34.0)
MCHC: 33.8 g/dL (ref 30.0–36.0)
MCV: 104.5 fL — ABNORMAL HIGH (ref 78.0–100.0)
Platelets: 188 10*3/uL (ref 150–400)
RBC: 2.66 MIL/uL — ABNORMAL LOW (ref 4.22–5.81)
RDW: 13.3 % (ref 11.5–15.5)
WBC: 8.9 10*3/uL (ref 4.0–10.5)

## 2017-07-03 LAB — CREATININE, FLUID (PLEURAL, PERITONEAL, JP DRAINAGE): Creat, Fluid: 0.8 mg/dL

## 2017-07-03 NOTE — Progress Notes (Signed)
Patient ID: [name], male   DOB: 01/22/1941, 76 y.o.   MRN: [id number] 4 Days Post-Op  Assessment: Status post radical cystectomy: He is progressing quite well.  His creatinine remains normal and his hemoglobin is 9.4 which is slightly decreased but there is no sign of active bleeding.  His drain output is as anticipated but will be checked for creatinine in preparation for removal.  I will advance him to a regular diet he will continue to ambulate with the possibility of discharge within 24-48 hours.  Plan: 1.  Regular diet. 2.  JP fluid for creatinine   Subjective: Patient is without complaint.  He tolerated his full liquid diet yesterday.  He has no significant complaints of pain.  He has had multiple bowel movements.  Objective: Vital signs in last 24 hours: Temp:  [98.1 F (36.7 C)-98.7 F (37.1 C)] 98.4 F (36.9 C) (11/18 0636) Pulse Rate:  [77-87] 77 (11/18 0636) Resp:  [12-18] 18 (11/18 0636) BP: (140-155)/(62-75) 140/75 (11/18 0636) SpO2:  [95 %-98 %] 98 % (11/18 0636)  Intake/Output from previous day: 11/17 0701 - 11/18 0700 In: 3200 [P.O.:800; I.V.:2400] Out: 1360 [Urine:1110; Drains:250] Intake/Output this shift: No intake/output data recorded.  Past Medical History:  Diagnosis Date  . BPH (benign prostatic hypertrophy)   . Dysuria   . GERD (gastroesophageal reflux disease)   . History of adenomatous polyp of colon    tubular adenoma 06/ 2018  . History of bladder cancer 12/2014   urologist-  dr [name]--  dx High Grade TCC without stromal invasion s/p TURBT and intravesical BCG tx/  recurrent 07/2015 (pTa) TCC  repear intravesical BCG tx   . History of hiatal hernia   . History of urethral stricture   . Nocturia    Current Facility-Administered Medications  Medication Dose Route Frequency Provider Last Rate Last Dose  . acetaminophen (TYLENOL) tablet 325 mg  325 mg Oral Q6H PRN Ceasar Mons, MD   325 mg at 06/28/17 2310  . alvimopan  (ENTEREG) capsule 12 mg  12 mg Oral BID Debbrah Alar, PA-C   12 mg at 07/02/17 2002  . dextrose 5 %-0.45 % sodium chloride infusion   Intravenous Continuous Alexis Frock, MD 100 mL/hr at 07/02/17 2200    . diphenhydrAMINE (BENADRYL) injection 12.5 mg  12.5 mg Intravenous Q6H PRN Debbrah Alar, PA-C   12.5 mg at 06/30/17 0408   Or  . diphenhydrAMINE (BENADRYL) 12.5 MG/5ML elixir 12.5 mg  12.5 mg Oral Q6H PRN Dancy, Amanda, PA-C      . enoxaparin (LOVENOX) injection 40 mg  40 mg Subcutaneous Q24H Alexis Frock, MD   40 mg at 07/02/17 1455  . HYDROmorphone (DILAUDID) 1 mg/mL PCA injection   Intravenous Q4H Dancy, Amanda, PA-C      . naloxone [facility]) injection 0.4 mg  0.4 mg Intravenous PRN Debbrah Alar, PA-C       And  . sodium chloride flush (NS) 0.9 % injection 9 mL  9 mL Intravenous PRN Dancy, Amanda, PA-C      . ondansetron (ZOFRAN) injection 4 mg  4 mg Intravenous Q6H PRN Debbrah Alar, PA-C   4 mg at 06/30/17 0357  . oxyCODONE (Oxy IR/ROXICODONE) immediate release tablet 5 mg  5 mg Oral Q4H PRN Alexis Frock, MD        Physical Exam:  General: Patient is in no apparent distress Lungs: Normal respiratory effort, chest expands symmetrically. GI: The abdomen is soft and nontender without mass.  Normoactive  bowel sounds.  Ostomy remains pink and viable with stents exiting.  All port sites are free of any sign of infection.    Lab Results: Recent Labs    07/01/17 0434 07/02/17 0504 07/03/17 0509  WBC 11.0* 9.0 8.9  HGB 10.5* 10.5* 9.4*  HCT 31.8* 30.9* 27.8*   BMET Recent Labs    07/02/17 0504 07/03/17 0509  NA 140 138  K 3.0* 3.2*  CL 105 105  CO2 29 29  GLUCOSE 97 108*  BUN 8 6  CREATININE 1.01 0.89  CALCIUM 8.2* 8.0*   No results for input(s): LABPT, INR in the last 72 hours. No results for input(s): LABURIN in the last 72 hours. Results for orders placed or performed during the hospital encounter of 06/28/17  MRSA PCR Screening     Status: None    Collection Time: 06/28/17 12:07 PM  Result Value Ref Range Status   MRSA by PCR NEGATIVE NEGATIVE Final    Comment:        The GeneXpert MRSA Assay (FDA approved for NASAL specimens only), is one component of a comprehensive MRSA colonization surveillance program. It is not intended to diagnose MRSA infection nor to guide or monitor treatment for MRSA infections.     Studies/Results: No results found.     Dandra Velardi C 07/03/2017, 7:52 AM

## 2017-07-03 NOTE — Progress Notes (Addendum)
Patient has had an uneventful day. Patient has been accompanied by wife. Patient will continue to be monitored.

## 2017-07-03 NOTE — Progress Notes (Signed)
Pharmacy Brief Note - Alvimopan (Entereg)  The standing order set for alvimopan (Entereg) now includes an automatic order to discontinue the drug after the patient has had a bowel movement.  The change was approved by the [place] and the Medical Executive Committee.    This patient has had a bowel movement documented by Dr Karsten Ro.  Therefore, alvimopan has been discontinued.  If there are questions, please contact the pharmacy at [phone].  Thank you  Gretta Arab PharmD, BCPS Pager [phone] 07/03/2017 1:18 PM

## 2017-07-04 LAB — BASIC METABOLIC PANEL
Anion gap: 4 — ABNORMAL LOW (ref 5–15)
BUN: <5 mg/dL — ABNORMAL LOW (ref 6–20)
CO2: 31 mmol/L (ref 22–32)
Calcium: 8.2 mg/dL — ABNORMAL LOW (ref 8.9–10.3)
Chloride: 103 mmol/L (ref 101–111)
Creatinine, Ser: 0.84 mg/dL (ref 0.61–1.24)
GFR calc Af Amer: >60 mL/min (ref >60)
GFR calc non Af Amer: >60 mL/min (ref >60)
Glucose, Bld: 107 mg/dL — ABNORMAL HIGH (ref 65–99)
Potassium: 2.9 mmol/L — ABNORMAL LOW (ref 3.5–5.1)
Sodium: 138 mmol/L (ref 135–145)

## 2017-07-04 LAB — CBC
HCT: 28.4 % — ABNORMAL LOW (ref 39.0–52.0)
Hemoglobin: 9.7 g/dL — ABNORMAL LOW (ref 13.0–17.0)
MCH: 35.1 pg — ABNORMAL HIGH (ref 26.0–34.0)
MCHC: 34.2 g/dL (ref 30.0–36.0)
MCV: 102.9 fL — ABNORMAL HIGH (ref 78.0–100.0)
Platelets: 213 10*3/uL (ref 150–400)
RBC: 2.76 MIL/uL — ABNORMAL LOW (ref 4.22–5.81)
RDW: 13.1 % (ref 11.5–15.5)
WBC: 7.3 10*3/uL (ref 4.0–10.5)

## 2017-07-04 MED ORDER — ONDANSETRON HCL 4 MG/2ML IJ SOLN: 4.0000 mg | INTRAMUSCULAR | Status: DC | PRN

## 2017-07-04 MED ORDER — SODIUM CHLORIDE 0.9 % IV SOLN: 250.0000 mL | INTRAVENOUS | Status: DC | PRN

## 2017-07-04 MED ORDER — SODIUM CHLORIDE 0.9% FLUSH: 3.0000 mL | INTRAVENOUS | Status: DC | PRN

## 2017-07-04 MED ORDER — SODIUM CHLORIDE 0.9% FLUSH
3.0000 mL | Freq: Two times a day (BID) | INTRAVENOUS | Status: DC
  Administered 2017-07-04 – 2017-07-05 (×2): 3 mL via INTRAVENOUS

## 2017-07-04 NOTE — Consult Note (Addendum)
[place] Nurse ostomy follow up: POD 5 Stoma type/location: RUQ ileal conduit with 2 stents intact Stomal assessment/size: 1 and 1/2 inches red, round, edematous Peristomal assessment: intact Treatment options for stomal/peristomal skin: skin barrier ring Output: blood tinged urine, patient is not taking in much water....only a clear carbonated beverage. Ostomy pouching: 1pc.convex with skin barrier ring.  Education provided:Patient reports nausea and fatigue today. Watched pouch removal and skin cleansing.  Wife cut out pouch today (1 and 1/2 inches) and removed plastic backing.  Able to verbalize that sizing should preceded cutting out pouch and that they will be able to order precut pouches when ostomy remains the same size for 2-3 weeks. Applied barrier ring (patient calls this a "seal") to the back of the pouching system and assists me as we place pouch over ostomy (I feed in two stents). Patient holds pouch for gentle warming hand pressure.  A good seal is achieved.  Patient is independent in connecting and disconnecting from pouch when OOB, but it should be noted that he is very fatigued today and somewhat nauseated.  Wife reports no BM since Friday, when he had 2 stools. Enrolled patient in [place] Discharge program: Yes, today. 4 pouches, 4 rings, powder and a belt.  [place] nursing team will follow, and will remain available to this patient, the nursing and medical teams.   Thanks, Maudie Flakes, MSN, RN, [place], Arther Abbott  Pager# [phone]

## 2017-07-04 NOTE — Care Management Important Message (Signed)
Important Message  Patient Details  Name: [name] MRN: [id number] Date of Birth: 09-May-1941   Medicare Important Message Given:  Yes    Kerin Salen 07/04/2017, 10:54 AMImportant Message  Patient Details  Name: [name] MRN: [id number] Date of Birth: 1940/12/30   Medicare Important Message Given:  Yes    Kerin Salen 07/04/2017, 10:54 AM

## 2017-07-04 NOTE — Progress Notes (Signed)
Wasted 22 mg Dilaudid PCA in sink. Witnessed waste with Sheffield Slider RN

## 2017-07-04 NOTE — Progress Notes (Signed)
5 Days Post-Op   Subjective/Chief Complaint:  1 - Bladder Cancer - s/p robotic cystoprostatectomy with ICG sentinal + template pelvic lymphadenectomy and ilial conduit diversion on 06/29/17. Admitted day prior for bowel prep. Path pending. Stepdown POD 0, to med-surg floor POD 1.   2 - Return of Bowel Function - small bowel anastamosis at time of surgery as part of urninary diversion. Received entered peri-op. NPO initilallya post-op. Advanced to clears POD 2. Resumed bowel function POD 4 and advanced to reg diet.   3 - Disposition / Rehab - independent at baseline with excellent functional status. Ostomy RN comanaging new urostomy in house. PT eval 11/15 feels no additional DME needs from PT perspective at discharge.   Today "[name]" is stable.  Tollerating reg diet w/o emesis.    Objective: Vital signs in last 24 hours: Temp:  [97.8 F (36.6 C)-98.8 F (37.1 C)] 98.8 F (37.1 C) (11/19 0528) Pulse Rate:  [76-82] 78 (11/19 0528) Resp:  [13-18] 18 (11/19 0528) BP: (139-157)/(67-75) 148/67 (11/19 0528) SpO2:  [95 %-99 %] 98 % (11/19 0528) Last BM Date: 07/03/17  Intake/Output from previous day: 11/18 0701 - 11/19 0700 In: 2000 [I.V.:2000] Out: 1480 [Urine:1200; Drains:280] Intake/Output this shift: No intake/output data recorded.  General appearance: alert, cooperative and appears stated age Eyes: negative Nose: Nares normal. Septum midline. Mucosa normal. No drainage or sinus tenderness. Throat: lips, mucosa, and tongue normal; teeth and gums normal Neck: supple, symmetrical, trachea midline Back: symmetric, no curvature. ROM normal. No CVA tenderness. Resp: non-labored on minimal [place] O2.  Cardio: Nl rate GI: soft, non-tender; bowel sounds normal; no masses,  no organomegaly Male genitalia: No CVAT Extremities: extremities normal, atraumatic, no cyanosis or edema Pulses: 2+ and symmetric Skin: Skin color, texture, turgor normal. No rashes or lesions Neurologic: Grossly  normal Incision/Wound: RLQ Urostomy pink / patent with Rt (red) and Lt (blue) bander stents in situ. Recent port and extraction sites c/d/i. JP with serosanguinous fluid that is non-foul.    Lab Results:  Recent Labs    07/03/17 0509 07/04/17 0457  WBC 8.9 7.3  HGB 9.4* 9.7*  HCT 27.8* 28.4*  PLT 188 213   BMET Recent Labs    07/03/17 0509 07/04/17 0457  NA 138 138  K 3.2* 2.9*  CL 105 103  CO2 29 31  GLUCOSE 108* 107*  BUN 6 <5*  CREATININE 0.89 0.84  CALCIUM 8.0* 8.2*   PT/INR No results for input(s): LABPROT, INR in the last 72 hours. ABG No results for input(s): PHART, HCO3 in the last 72 hours.  Invalid input(s): PCO2, PO2  Studies/Results: No results found.  Anti-infectives: Anti-infectives (From admission, onward)   Start     Dose/Rate Route Frequency Ordered Stop   06/29/17 0821  piperacillin-tazobactam (ZOSYN) 3.375 GM/50ML IVPB    Comments:  Enrigue Catena   : cabinet override      06/29/17 0821 06/29/17 2029   06/28/17 1400  neomycin (MYCIFRADIN) tablet 1,000 mg     1,000 mg Oral Every 8 hours 06/28/17 1146 06/29/17 0606   06/28/17 1400  metroNIDAZOLE (FLAGYL) tablet 500 mg     500 mg Oral Every 8 hours 06/28/17 1146 06/29/17 0606   06/28/17 1200  piperacillin-tazobactam (ZOSYN) IVPB 3.375 g     3.375 g 100 mL/hr over 30 Minutes Intravenous 30 min pre-op 06/28/17 1132 06/29/17 0936      Assessment/Plan:  1 - Bladder Cancer - path pending. No further cancer directed threrapy in house.  2 - Return of Bowel Function - ileus resolved. Continue reg diet. Saline lock, transition to PRN pain meds only.   3 - Disposition / Rehab - Home health orderes placed today. Likely DC homoe tomorrow AM as long as has good day today.   [facility], Melvenia Favela 07/04/2017

## 2017-07-04 NOTE — Progress Notes (Signed)
This CM was contacted by [facility] rep to alert that pt was referred to them for home health services prior to admission. [facility] rep to follow while pt remains in hospital and will arrange services per MD orders for discharge. Marney Doctor RN,BSN,NCM [phone]

## 2017-07-05 MED ORDER — ENSURE ENLIVE PO LIQD: 237.0000 mL | Freq: Two times a day (BID) | ORAL | Status: DC

## 2017-07-05 MED ORDER — OXYCODONE-ACETAMINOPHEN 5-325 MG PO TABS: 1.0000 | ORAL_TABLET | ORAL | 0 refills | Status: DC | PRN

## 2017-07-05 NOTE — Discharge Summary (Signed)
Physician Discharge Summary  Patient ID: [name] MRN: [id number] DOB/AGE: [birth year] 76 y.o.  Admit date: 06/28/2017 Discharge date: 07/05/2017  Admission Diagnoses: Bladder Cancer  Discharge Diagnoses:  Active Problems:   Bladder cancer [facility])   Status post ileal conduit [facility])   Discharged Condition: good  Hospital Course:   1 - Bladder Cancer - s/p robotic cystoprostatectomy with ICG sentinal + template pelvic lymphadenectomy and ilial conduit diversion on 06/29/17. Admitted day prior for bowel prep. Final path [id number].  Stepdown POD 0, to med-surg floor POD 1.   2 - Return of Bowel Function - small bowel anastamosis at time of surgery as part of urninary diversion. Received entered peri-op. NPO initilallya post-op. Advanced to clears POD 2. Resumed bowel function POD 4 and advanced to reg diet which he tollerated at discharge.  3 - Disposition / Rehab - independent at baseline with excellent functional status. Ostomy RN comanaging new urostomy in house. PT eval 11/15 feels no additional DME needs from PT perspective at discharge. Established with [facility] home health at discharge.  By POD 6, the day of discharge, he is ambulatory, pain controlled on PO meds, maintaining PO nutrition, and felt to be adequate for discahrge. JP remvoed prior to discharge as output scant and Cr same as serum.    Consults: PT, Ostomy RN, Case Management  Significant Diagnostic Studies: labs: as per above  Treatments: surgery: as per above  Discharge Exam: Blood pressure (!) 142/75, pulse 74, temperature 98.4 F (36.9 C), temperature source Oral, resp. rate 18, height 6' 0.01" (1.829 m), weight 83.1 kg (183 lb 3.2 oz), SpO2 95 %.  General appearance: alert, cooperative and appears stated age Eyes: negative Nose: Nares normal. Septum midline. Mucosa normal. No drainage or sinus tenderness. Throat: lips, mucosa, and tongue normal; teeth and gums normal Neck: supple, symmetrical, trachea  midline Back: symmetric, no curvature. ROM normal. No CVA tenderness. Resp: non-labored on minimal [place] O2.  Cardio: Nl rate GI: soft, non-tender; bowel sounds normal; no masses,  no organomegaly Male genitalia: No CVAT Extremities: extremities normal, atraumatic, no cyanosis or edema Pulses: 2+ and symmetric Skin: Skin color, texture, turgor normal. No rashes or lesions Neurologic: Grossly normal Incision/Wound: RLQ Urostomy pink / patent with Rt (red) and Lt (blue) bander stents in situ. Recent port and extraction sites c/d/i.     Disposition: 01-Home or Self Care    Follow-up Information    Alexis Frock, MD Follow up on 07/21/2017.   Specialty:  Urology Why:  at 9:15 AM for MD visit.  Contact information: [address] [place] [address] [phone]        [facility] Follow up.   Specialty:  Home Health Services Contact information: [place] [address] [phone]           Signed: Alexis Frock 07/05/2017, 12:00 PM

## 2017-07-05 NOTE — Progress Notes (Signed)
D/c ing home via W/C w/ wife and DTR.All d/c instructions given ,Including [facility] came by,w/ verbal understanding.

## 2017-07-05 NOTE — Progress Notes (Signed)
To D/C home w/ family today awaiting [facility] to see for home care reinforcement teaching per wife Pat's request.

## 2017-07-05 NOTE — Consult Note (Signed)
[place] Nurse ostomy follow up Stoma type/location: RUQ ileal conduit  Pouch applied yesterday is intact.  Patient is feeling more confident about discharge today. Stomal assessment/size: 1 and 1/2 inch Peristomal assessment: Not seen today Treatment options for stomal/peristomal skin: skin barrier ring and convex pouch. Output Clear, blood-tinged urine Ostomy pouching: 1pc.convex pouch with skin barrier ring  Education provided: Questions answered.  Will follow up with Dr. Tresa Moore as scheduled.  HHRN has been arranged. Enrolled patient in [place] Start Discharge program: Yes  [facility] nursing team will remain available to this patient, the nursing and medical teams.  Thanks, Maudie Flakes, MSN, RN, [place], Arther Abbott  Pager# [phone]

## 2017-07-06 DIAGNOSIS — Z936 Other artificial openings of urinary tract status: Secondary | ICD-10-CM | POA: Diagnosis not present

## 2017-07-06 DIAGNOSIS — Z906 Acquired absence of other parts of urinary tract: Secondary | ICD-10-CM | POA: Diagnosis not present

## 2017-07-06 DIAGNOSIS — Z8551 Personal history of malignant neoplasm of bladder: Secondary | ICD-10-CM | POA: Diagnosis not present

## 2017-07-06 DIAGNOSIS — Z48816 Encounter for surgical aftercare following surgery on the genitourinary system: Secondary | ICD-10-CM | POA: Diagnosis not present

## 2017-07-08 DIAGNOSIS — Z936 Other artificial openings of urinary tract status: Secondary | ICD-10-CM | POA: Diagnosis not present

## 2017-07-08 DIAGNOSIS — Z8551 Personal history of malignant neoplasm of bladder: Secondary | ICD-10-CM | POA: Diagnosis not present

## 2017-07-08 DIAGNOSIS — Z906 Acquired absence of other parts of urinary tract: Secondary | ICD-10-CM | POA: Diagnosis not present

## 2017-07-08 DIAGNOSIS — Z48816 Encounter for surgical aftercare following surgery on the genitourinary system: Secondary | ICD-10-CM | POA: Diagnosis not present

## 2017-07-11 DIAGNOSIS — Z48816 Encounter for surgical aftercare following surgery on the genitourinary system: Secondary | ICD-10-CM | POA: Diagnosis not present

## 2017-07-11 DIAGNOSIS — Z936 Other artificial openings of urinary tract status: Secondary | ICD-10-CM | POA: Diagnosis not present

## 2017-07-11 DIAGNOSIS — Z906 Acquired absence of other parts of urinary tract: Secondary | ICD-10-CM | POA: Diagnosis not present

## 2017-07-11 DIAGNOSIS — Z8551 Personal history of malignant neoplasm of bladder: Secondary | ICD-10-CM | POA: Diagnosis not present

## 2017-07-14 DIAGNOSIS — Z936 Other artificial openings of urinary tract status: Secondary | ICD-10-CM | POA: Diagnosis not present

## 2017-07-14 DIAGNOSIS — Z48816 Encounter for surgical aftercare following surgery on the genitourinary system: Secondary | ICD-10-CM | POA: Diagnosis not present

## 2017-07-14 DIAGNOSIS — Z8551 Personal history of malignant neoplasm of bladder: Secondary | ICD-10-CM | POA: Diagnosis not present

## 2017-07-14 DIAGNOSIS — Z906 Acquired absence of other parts of urinary tract: Secondary | ICD-10-CM | POA: Diagnosis not present

## 2017-07-18 DIAGNOSIS — Z8551 Personal history of malignant neoplasm of bladder: Secondary | ICD-10-CM | POA: Diagnosis not present

## 2017-07-18 DIAGNOSIS — Z936 Other artificial openings of urinary tract status: Secondary | ICD-10-CM | POA: Diagnosis not present

## 2017-07-18 DIAGNOSIS — Z906 Acquired absence of other parts of urinary tract: Secondary | ICD-10-CM | POA: Diagnosis not present

## 2017-07-18 DIAGNOSIS — Z48816 Encounter for surgical aftercare following surgery on the genitourinary system: Secondary | ICD-10-CM | POA: Diagnosis not present

## 2017-07-22 DIAGNOSIS — Z936 Other artificial openings of urinary tract status: Secondary | ICD-10-CM | POA: Diagnosis not present

## 2017-07-22 DIAGNOSIS — Z48816 Encounter for surgical aftercare following surgery on the genitourinary system: Secondary | ICD-10-CM | POA: Diagnosis not present

## 2017-07-22 DIAGNOSIS — Z906 Acquired absence of other parts of urinary tract: Secondary | ICD-10-CM | POA: Diagnosis not present

## 2017-07-22 DIAGNOSIS — Z8551 Personal history of malignant neoplasm of bladder: Secondary | ICD-10-CM | POA: Diagnosis not present

## 2017-07-26 ENCOUNTER — Ambulatory Visit: Payer: Medicare Other | Admitting: Oncology

## 2017-07-27 DIAGNOSIS — Z906 Acquired absence of other parts of urinary tract: Secondary | ICD-10-CM | POA: Diagnosis not present

## 2017-07-27 DIAGNOSIS — Z8551 Personal history of malignant neoplasm of bladder: Secondary | ICD-10-CM | POA: Diagnosis not present

## 2017-07-27 DIAGNOSIS — Z936 Other artificial openings of urinary tract status: Secondary | ICD-10-CM | POA: Diagnosis not present

## 2017-07-27 DIAGNOSIS — Z48816 Encounter for surgical aftercare following surgery on the genitourinary system: Secondary | ICD-10-CM | POA: Diagnosis not present

## 2017-07-28 DIAGNOSIS — Z681 Body mass index (BMI) 19 or less, adult: Secondary | ICD-10-CM | POA: Diagnosis not present

## 2017-07-28 DIAGNOSIS — N12 Tubulo-interstitial nephritis, not specified as acute or chronic: Secondary | ICD-10-CM | POA: Diagnosis not present

## 2017-07-28 DIAGNOSIS — G894 Chronic pain syndrome: Secondary | ICD-10-CM | POA: Diagnosis not present

## 2017-07-28 DIAGNOSIS — N390 Urinary tract infection, site not specified: Secondary | ICD-10-CM | POA: Diagnosis not present

## 2017-07-28 DIAGNOSIS — R509 Fever, unspecified: Secondary | ICD-10-CM | POA: Diagnosis not present

## 2017-08-02 DIAGNOSIS — Z936 Other artificial openings of urinary tract status: Secondary | ICD-10-CM | POA: Diagnosis not present

## 2017-08-02 DIAGNOSIS — Z906 Acquired absence of other parts of urinary tract: Secondary | ICD-10-CM | POA: Diagnosis not present

## 2017-08-02 DIAGNOSIS — Z8551 Personal history of malignant neoplasm of bladder: Secondary | ICD-10-CM | POA: Diagnosis not present

## 2017-08-02 DIAGNOSIS — Z48816 Encounter for surgical aftercare following surgery on the genitourinary system: Secondary | ICD-10-CM | POA: Diagnosis not present

## 2017-08-05 DIAGNOSIS — Z48816 Encounter for surgical aftercare following surgery on the genitourinary system: Secondary | ICD-10-CM | POA: Diagnosis not present

## 2017-08-05 DIAGNOSIS — Z906 Acquired absence of other parts of urinary tract: Secondary | ICD-10-CM | POA: Diagnosis not present

## 2017-08-05 DIAGNOSIS — Z936 Other artificial openings of urinary tract status: Secondary | ICD-10-CM | POA: Diagnosis not present

## 2017-08-05 DIAGNOSIS — Z8551 Personal history of malignant neoplasm of bladder: Secondary | ICD-10-CM | POA: Diagnosis not present

## 2017-08-11 DIAGNOSIS — C678 Malignant neoplasm of overlapping sites of bladder: Secondary | ICD-10-CM | POA: Diagnosis not present

## 2017-08-12 DIAGNOSIS — Z8551 Personal history of malignant neoplasm of bladder: Secondary | ICD-10-CM | POA: Diagnosis not present

## 2017-08-12 DIAGNOSIS — Z48816 Encounter for surgical aftercare following surgery on the genitourinary system: Secondary | ICD-10-CM | POA: Diagnosis not present

## 2017-08-12 DIAGNOSIS — Z936 Other artificial openings of urinary tract status: Secondary | ICD-10-CM | POA: Diagnosis not present

## 2017-08-12 DIAGNOSIS — Z906 Acquired absence of other parts of urinary tract: Secondary | ICD-10-CM | POA: Diagnosis not present

## 2017-08-25 DIAGNOSIS — Z936 Other artificial openings of urinary tract status: Secondary | ICD-10-CM | POA: Diagnosis not present

## 2017-08-25 DIAGNOSIS — C678 Malignant neoplasm of overlapping sites of bladder: Secondary | ICD-10-CM | POA: Diagnosis not present

## 2017-09-07 DIAGNOSIS — J029 Acute pharyngitis, unspecified: Secondary | ICD-10-CM | POA: Diagnosis not present

## 2017-09-07 DIAGNOSIS — J069 Acute upper respiratory infection, unspecified: Secondary | ICD-10-CM | POA: Diagnosis not present

## 2017-09-21 DIAGNOSIS — E039 Hypothyroidism, unspecified: Secondary | ICD-10-CM | POA: Diagnosis not present

## 2017-09-21 DIAGNOSIS — Z79899 Other long term (current) drug therapy: Secondary | ICD-10-CM | POA: Diagnosis not present

## 2017-09-21 DIAGNOSIS — J309 Allergic rhinitis, unspecified: Secondary | ICD-10-CM | POA: Diagnosis not present

## 2017-09-21 DIAGNOSIS — Z6827 Body mass index (BMI) 27.0-27.9, adult: Secondary | ICD-10-CM | POA: Diagnosis not present

## 2017-09-21 DIAGNOSIS — R5383 Other fatigue: Secondary | ICD-10-CM | POA: Diagnosis not present

## 2017-09-21 DIAGNOSIS — G5702 Lesion of sciatic nerve, left lower limb: Secondary | ICD-10-CM | POA: Diagnosis not present

## 2017-09-21 DIAGNOSIS — Z79891 Long term (current) use of opiate analgesic: Secondary | ICD-10-CM | POA: Diagnosis not present

## 2017-09-21 DIAGNOSIS — J449 Chronic obstructive pulmonary disease, unspecified: Secondary | ICD-10-CM | POA: Diagnosis not present

## 2017-09-21 DIAGNOSIS — E663 Overweight: Secondary | ICD-10-CM | POA: Diagnosis not present

## 2017-09-21 DIAGNOSIS — Z1389 Encounter for screening for other disorder: Secondary | ICD-10-CM | POA: Diagnosis not present

## 2017-09-21 DIAGNOSIS — I10 Essential (primary) hypertension: Secondary | ICD-10-CM | POA: Diagnosis not present

## 2017-09-21 DIAGNOSIS — E669 Obesity, unspecified: Secondary | ICD-10-CM | POA: Diagnosis not present

## 2017-09-21 DIAGNOSIS — C679 Malignant neoplasm of bladder, unspecified: Secondary | ICD-10-CM | POA: Diagnosis not present

## 2017-09-21 DIAGNOSIS — K219 Gastro-esophageal reflux disease without esophagitis: Secondary | ICD-10-CM | POA: Diagnosis not present

## 2017-09-21 DIAGNOSIS — N400 Enlarged prostate without lower urinary tract symptoms: Secondary | ICD-10-CM | POA: Diagnosis not present

## 2017-09-21 DIAGNOSIS — M1388 Other specified arthritis, other site: Secondary | ICD-10-CM | POA: Diagnosis not present

## 2017-09-26 DIAGNOSIS — E663 Overweight: Secondary | ICD-10-CM | POA: Diagnosis not present

## 2017-09-26 DIAGNOSIS — Z6827 Body mass index (BMI) 27.0-27.9, adult: Secondary | ICD-10-CM | POA: Diagnosis not present

## 2017-09-26 DIAGNOSIS — M7061 Trochanteric bursitis, right hip: Secondary | ICD-10-CM | POA: Diagnosis not present

## 2017-09-27 ENCOUNTER — Telehealth: Payer: Self-pay

## 2017-09-27 ENCOUNTER — Inpatient Hospital Stay: Payer: Medicare Other | Attending: Oncology | Admitting: Oncology

## 2017-09-27 VITALS — BP 145/95 | HR 69 | Temp 98.1°F | Resp 20 | Ht 72.0 in | Wt 192.0 lb

## 2017-09-27 DIAGNOSIS — M25551 Pain in right hip: Secondary | ICD-10-CM | POA: Diagnosis not present

## 2017-09-27 DIAGNOSIS — Z79899 Other long term (current) drug therapy: Secondary | ICD-10-CM | POA: Insufficient documentation

## 2017-09-27 DIAGNOSIS — Z9221 Personal history of antineoplastic chemotherapy: Secondary | ICD-10-CM | POA: Insufficient documentation

## 2017-09-27 DIAGNOSIS — Z906 Acquired absence of other parts of urinary tract: Secondary | ICD-10-CM | POA: Insufficient documentation

## 2017-09-27 DIAGNOSIS — C679 Malignant neoplasm of bladder, unspecified: Secondary | ICD-10-CM | POA: Diagnosis not present

## 2017-09-27 NOTE — Progress Notes (Signed)
Hematology and Oncology Follow Up Visit  [name] [id number] September 21, 1940 77 y.o. 09/27/2017 10:25 AM [name], MDFusco, Purcell Nails, MD   Principle Diagnosis: 77 year-old man with high-grade urothelial carcinoma arising from the bladder with muscle invasion diagnosed in August 2018.  The final pathological staging was T3bN0.  Prior Therapy:   He is status post neoadjuvant chemotherapy utilizing gemcitabine and cisplatin started in September 2018.  He received day 1 of cycle 1 on 05/02/2017 and therapy discontinued after that.  He is status post laparoscopic Cystoprostatectomy and bilateral lymphadenectomy done by Dr. Tresa Moore on 06/29/2017.  A final pathology showed a T3bN0 disease.  He has 13 lymph nodes sampled without any evidence of malignancy.  Current therapy: Observation and surveillance.  Interim History: [name] is here for a follow-up visit.  Since last visit, he underwent cystoprostatectomy and lymphadenectomy performed in November 2018 and tolerated it well.  He denies any complications after that and has recovered reasonably well.  He has gained weight with excellent improvement in his performance status and appetite.  For the last few weeks, he started developing right-sided hip pain especially in the morning that improves as the day progresses.  He denies any neurological deficits or falls.  He did have a an injection arranged by his primary care physician regarding this issue.  He does not report any headaches, blurry vision, syncope or seizures. He does not report any fevers, chills or sweats. He does not report any cough, wheezing or hemoptysis. He does not report any chest pain, palpitation, orthopnea or leg edema. He does not report any nausea, vomiting, abdominal pain, constipation, diarrhea, hematochezia or melena. He does not report any frequency urgency or hesitancy. He does not report any petechia or rash. Remaining review of systems.  Medications: I have reviewed  the patient's current medications.  Current Outpatient Medications  Medication Sig Dispense Refill  . HYDROcodone-ibuprofen (VICOPROFEN) 7.5-200 MG tablet     . levothyroxine (SYNTHROID, LEVOTHROID) 25 MCG tablet 25 mcg every morning.    Marland Kitchen oxyCODONE-acetaminophen (ROXICET) 5-325 MG tablet Take 1 tablet by mouth every 4 (four) hours as needed for severe pain. Post-operatively 20 tablet 0   No current facility-administered medications for this visit.      Allergies:  Allergies  Allergen Reactions  . Ciprofloxacin Hives    Past Medical History, Surgical history, Social history, and Family History were reviewed and updated.  Physical Exam: Blood pressure (!) 145/95, pulse 69, temperature 98.1 F (36.7 C), temperature source Oral, resp. rate 20, height 6' (1.829 m), weight 192 lb (87.1 kg), SpO2 96 %. ECOG: 1 General appearance: alert and cooperative appeared without distress. Head: Normocephalic, without obvious abnormality  Oropharynx: Without ulcers or thrush. Eyes: No scleral icterus.  Lymph nodes: Cervical, supraclavicular, and axillary nodes normal. Heart:regular rate and rhythm, S1, S2 normal, no murmur, click, rub or gallop Lung: Lear in all lung fields without wheezes or dullness to percussion. Abdomin: Soft, nontender without rebound or guarding. Skeletal: No joint deformity or effusion.  He has full range of motion in his right hip.  Skin no rashes or lesions.   Lab Results: Lab Results  Component Value Date   WBC 7.3 07/04/2017   HGB 9.7 (L) 07/04/2017   HCT 28.4 (L) 07/04/2017   MCV 102.9 (H) 07/04/2017   PLT 213 07/04/2017     Chemistry      Component Value Date/Time   NA 138 07/04/2017 0457   NA 141 05/23/2017 0828   K  2.9 (L) 07/04/2017 0457   K 4.0 05/23/2017 0828   CL 103 07/04/2017 0457   CO2 31 07/04/2017 0457   CO2 27 05/23/2017 0828   BUN <5 (L) 07/04/2017 0457   BUN 12.5 05/23/2017 0828   CREATININE 0.84 07/04/2017 0457   CREATININE 0.8  05/23/2017 0828      Component Value Date/Time   CALCIUM 8.2 (L) 07/04/2017 0457   CALCIUM 9.0 05/23/2017 0828   ALKPHOS 63 06/28/2017 1200   ALKPHOS 57 05/23/2017 0828   AST 11 (L) 06/28/2017 1200   AST 13 05/23/2017 0828   ALT 12 (L) 06/28/2017 1200   ALT 15 05/23/2017 0828   BILITOT 0.8 06/28/2017 1200   BILITOT 0.37 05/23/2017 0828       Impression and Plan:  77 year old gentleman with the following issues:  1. High-grade urothelial carcinoma arising from the bladder with muscle invasion diagnosed in August 2018.  He received day 1 of cycle 1 of neoadjuvant chemotherapy and elected to proceed with surgery after that.  He is status post cystoprostatectomy done in November 2018 with the final pathology showed T3bN0.  He had 13 lymph nodes sampled without any evidence of disease.  He had high-grade urothelial carcinoma with microscopic tumor extension into the perivesicular soft tissue with vascular invasion.  Margins are negative.  The natural course of this disease was discussed today as well as the role for adjuvant chemotherapy was discussed.  Given his previous exposure to chemotherapy preoperatively and his wishes at this time we will defer chemotherapy unless he develops systemic disease.  Strict surveillance will be needed given his high risk of recurrence rate locally.  He will have a repeat imaging studies in May 2019.  2.  Right-sided hip pain: I do not think this is related to his cancer diagnosis.  I urged him to follow-up with his primary care physician and possibly obtain imaging studies of the hip.  3. Renal function surveillance: His creatinine is within normal range with good kidney function.  4. Follow-up: Be in 4 months after repeat imaging studies.  15  minutes was spent with the patient face-to-face today.  More than 50% of time was dedicated to patient counseling, education and coordination of the patient's multifaceted care.   Zola Button,  MD 2/12/201910:25 AM

## 2017-09-27 NOTE — Telephone Encounter (Signed)
Printed avs and calender for upcoming appointment. Per 2/12 los 

## 2017-09-28 ENCOUNTER — Other Ambulatory Visit (HOSPITAL_COMMUNITY): Payer: Self-pay | Admitting: Physician Assistant

## 2017-09-28 ENCOUNTER — Ambulatory Visit (HOSPITAL_COMMUNITY)
Admission: RE | Admit: 2017-09-28 | Discharge: 2017-09-28 | Disposition: A | Discharge status: Home / Self Care | Payer: Medicare Other | Source: Ambulatory Visit | Attending: Physician Assistant | Admitting: Physician Assistant

## 2017-09-28 DIAGNOSIS — M25551 Pain in right hip: Secondary | ICD-10-CM

## 2017-09-28 DIAGNOSIS — M160 Bilateral primary osteoarthritis of hip: Secondary | ICD-10-CM | POA: Diagnosis not present

## 2017-09-28 DIAGNOSIS — M47816 Spondylosis without myelopathy or radiculopathy, lumbar region: Secondary | ICD-10-CM | POA: Insufficient documentation

## 2017-10-17 ENCOUNTER — Other Ambulatory Visit (HOSPITAL_COMMUNITY): Payer: Self-pay | Admitting: Orthopedic Surgery

## 2017-10-17 DIAGNOSIS — M7061 Trochanteric bursitis, right hip: Secondary | ICD-10-CM | POA: Diagnosis not present

## 2017-10-17 DIAGNOSIS — M25551 Pain in right hip: Secondary | ICD-10-CM | POA: Diagnosis not present

## 2017-10-17 DIAGNOSIS — G8929 Other chronic pain: Secondary | ICD-10-CM

## 2017-10-17 DIAGNOSIS — M5440 Lumbago with sciatica, unspecified side: Principal | ICD-10-CM

## 2017-10-17 DIAGNOSIS — M545 Low back pain: Secondary | ICD-10-CM | POA: Diagnosis not present

## 2017-10-21 ENCOUNTER — Ambulatory Visit (HOSPITAL_COMMUNITY)
Admission: RE | Admit: 2017-10-21 | Discharge: 2017-10-21 | Disposition: A | Discharge status: Home / Self Care | Payer: Medicare Other | Source: Ambulatory Visit | Attending: Orthopedic Surgery | Admitting: Orthopedic Surgery

## 2017-10-21 DIAGNOSIS — M545 Low back pain: Secondary | ICD-10-CM | POA: Insufficient documentation

## 2017-10-21 DIAGNOSIS — M47816 Spondylosis without myelopathy or radiculopathy, lumbar region: Secondary | ICD-10-CM | POA: Diagnosis not present

## 2017-10-21 DIAGNOSIS — G8929 Other chronic pain: Secondary | ICD-10-CM

## 2017-10-21 DIAGNOSIS — M5440 Lumbago with sciatica, unspecified side: Secondary | ICD-10-CM

## 2017-10-26 DIAGNOSIS — M545 Low back pain: Secondary | ICD-10-CM | POA: Diagnosis not present

## 2017-10-26 DIAGNOSIS — M7061 Trochanteric bursitis, right hip: Secondary | ICD-10-CM | POA: Diagnosis not present

## 2017-10-26 DIAGNOSIS — M25551 Pain in right hip: Secondary | ICD-10-CM | POA: Diagnosis not present

## 2017-10-28 DIAGNOSIS — L209 Atopic dermatitis, unspecified: Secondary | ICD-10-CM | POA: Diagnosis not present

## 2017-10-28 DIAGNOSIS — T50905A Adverse effect of unspecified drugs, medicaments and biological substances, initial encounter: Secondary | ICD-10-CM | POA: Diagnosis not present

## 2017-10-31 DIAGNOSIS — C679 Malignant neoplasm of bladder, unspecified: Secondary | ICD-10-CM | POA: Diagnosis not present

## 2017-10-31 DIAGNOSIS — M25551 Pain in right hip: Secondary | ICD-10-CM | POA: Diagnosis not present

## 2017-10-31 DIAGNOSIS — E663 Overweight: Secondary | ICD-10-CM | POA: Diagnosis not present

## 2017-10-31 DIAGNOSIS — Z6827 Body mass index (BMI) 27.0-27.9, adult: Secondary | ICD-10-CM | POA: Diagnosis not present

## 2017-11-02 ENCOUNTER — Other Ambulatory Visit (HOSPITAL_COMMUNITY): Payer: Self-pay | Admitting: Physician Assistant

## 2017-11-10 DIAGNOSIS — M5136 Other intervertebral disc degeneration, lumbar region: Secondary | ICD-10-CM | POA: Diagnosis not present

## 2017-11-18 DIAGNOSIS — C678 Malignant neoplasm of overlapping sites of bladder: Secondary | ICD-10-CM | POA: Diagnosis not present

## 2017-11-22 DIAGNOSIS — M25551 Pain in right hip: Secondary | ICD-10-CM | POA: Diagnosis not present

## 2017-11-22 DIAGNOSIS — M7061 Trochanteric bursitis, right hip: Secondary | ICD-10-CM | POA: Diagnosis not present

## 2017-11-22 DIAGNOSIS — M5136 Other intervertebral disc degeneration, lumbar region: Secondary | ICD-10-CM | POA: Diagnosis not present

## 2017-11-24 ENCOUNTER — Ambulatory Visit (HOSPITAL_COMMUNITY): Payer: Medicare Other | Attending: Orthopedic Surgery

## 2017-11-24 ENCOUNTER — Encounter (HOSPITAL_COMMUNITY): Payer: Self-pay

## 2017-11-24 DIAGNOSIS — R29898 Other symptoms and signs involving the musculoskeletal system: Secondary | ICD-10-CM

## 2017-11-24 DIAGNOSIS — M25551 Pain in right hip: Secondary | ICD-10-CM | POA: Insufficient documentation

## 2017-11-24 DIAGNOSIS — M6281 Muscle weakness (generalized): Secondary | ICD-10-CM | POA: Diagnosis not present

## 2017-11-24 NOTE — Therapy (Signed)
[place], [place], [address] Phone: [phone]   Fax:  [phone]  Physical Therapy Evaluation  Patient Details  Name: [name] MRN: [id number] Date of Birth: 1941-06-19 Referring Provider: Paralee Cancel, MD   Encounter Date: 11/24/2017  PT End of Session - 11/24/17 1135    Visit Number  1    Number of Visits  9    Date for PT Re-Evaluation  12/22/17    Authorization Type  Medicare; Secondary: [employer] [facility]    Authorization Time Period  11/24/17 to 12/22/17    PT Start Time  1030    PT Stop Time  1112    PT Time Calculation (min)  42 min    Activity Tolerance  Patient tolerated treatment well    Behavior During Therapy  [facility] for tasks assessed/performed       Past Medical History:  Diagnosis Date  . BPH (benign prostatic hypertrophy)   . Dysuria   . GERD (gastroesophageal reflux disease)   . History of adenomatous polyp of colon    tubular adenoma 06/ 2018  . History of bladder cancer 12/2014   urologist-  dr [name]--  dx High Grade TCC without stromal invasion s/p TURBT and intravesical BCG tx/  recurrent 07/2015 (pTa) TCC  repear intravesical BCG tx   . History of hiatal hernia   . History of urethral stricture   . Nocturia     Past Surgical History:  Procedure Laterality Date  . CARDIAC CATHETERIZATION  1997   normal coronary arteries  . CARDIOVASCULAR STRESS TEST  12-14-2005   Low risk perfusion study/  very small scar in the inferior septum from mid ventricle to apex,  no ischemia/  mild inferior septal hypokinesis/  ef 58%  . CATARACT EXTRACTION W/ INTRAOCULAR LENS  IMPLANT, BILATERAL  2011  . COLONOSCOPY  last one 06/ 2018  . CYSTOSCOPY WITH BIOPSY N/A 08/12/2015   Procedure: CYSTOSCOPY WITH BIOPSY AND FULGERATION;  Surgeon: [name], MD;  Location[facility];  Service: Urology;  Laterality: N/A;  . CYSTOSCOPY WITH INJECTION N/A 06/29/2017   Procedure:  CYSTOSCOPY WITH INJECTION OF INDOCYANINE GREEN DYE;  Surgeon: Alexis Frock, MD;  Location: [facility];  Service: Urology;  Laterality: N/A;  . CYSTOSCOPY WITH URETHRAL DILATATION N/A 03/21/2017   Procedure: CYSTOSCOPY;  Surgeon: [name], MD;  Location: [facility];  Service: Urology;  Laterality: N/A;  . ESOPHAGOGASTRODUODENOSCOPY  12-05-2014  . TRANSTHORACIC ECHOCARDIOGRAM  01-31-2008   nomral LVF,  ef 55-65%/  mild pulmonic stenosis without regurg.,  peak transpulomonic valve gradient 84mmHg  . TRANSURETHRAL RESECTION OF BLADDER TUMOR N/A 12/31/2014   Procedure: TRANSURETHRAL RESECTION OF BLADDER TUMOR (TURBT);  Surgeon: Lowella Bandy, MD;  [facility];  Service: Urology;  Laterality: N/A;  . TRANSURETHRAL RESECTION OF BLADDER TUMOR N/A 03/21/2017   Procedure: POSSIBLE TRANSURETHRAL RESECTION OF BLADDER TUMOR (TURBT);  Surgeon: [name], MD;  Location: [facility];  Service: Urology;  Laterality: N/A;    There were no vitals filed for this visit.   Subjective Assessment - 11/24/17 1034    Subjective  Pt states that he feels like he has pulled a muscle in his leg. he states he's been dealing with it for almost 3 months; it started insidiously. He thinks maybe it could have happened when he played golf. He's had 5 shots in his hip and 1 shot in  his back. His pain is 24/7 and it's a constant ache. It is only his R leg. He can't sit down due to the pain. It hurts right at his posterior glute/proximal HS. He goes to see a neurosurgeon next week. He had imaging done on his back and hip; both were negative for any findings. He can't play golf in the last month due to the pain.     Limitations  Sitting    How long can you sit comfortably?  a few mins    How long can you stand comfortably?  no issues    How long can you walk comfortably?  limps some but not limited in distance    Diagnostic tests  imaging negative    Patient Stated  Goals  get well    Currently in Pain?  Yes    Pain Score  8     Pain Location  Leg    Pain Orientation  Right;Posterior    Pain Descriptors / Indicators  Aching;Dull    Pain Type  Chronic pain    Pain Onset  More than a month ago    Pain Frequency  Constant    Aggravating Factors   sitting    Pain Relieving Factors  standing    Effect of Pain on Daily Activities  keeps going         [facility] PT Assessment - 11/24/17 0001      Assessment   Medical Diagnosis  Degeneration of Lumbar Intervertebral disc    Referring Provider  Paralee Cancel, MD    Onset Date/Surgical Date  08/26/17    Next MD Visit  no f/u scheduled neurosurgeon next week    Prior Therapy  yes for shoulder      Balance Screen   Has the patient fallen in the past 6 months  No    Has the patient had a decrease in activity level because of a fear of falling?   No    Is the patient reluctant to leave their home because of a fear of falling?   No      Prior Function   Level of Independence  Independent    Vocation  Retired    Leisure  golf      Observation/Other Assessments   Focus on Therapeutic Outcomes (FOTO)   did not perform this date as it was entered for LBP and since pt is not having LBP, did not feel it was appropriate to have him complete it      Posture/Postural Control   Posture/Postural Control  Postural limitations    Postural Limitations  Rounded Shoulders;Forward head;Increased lumbar lordosis;Increased thoracic kyphosis      ROM / Strength   AROM / PROM / Strength  AROM;Strength      AROM   AROM Assessment Site  Lumbar    Lumbar Flexion  25% limited, in lumbar lordosis    Lumbar Extension  WFL    Lumbar - Right Side Bend  WFL    Lumbar - Left Side Bend  WFL    Lumbar - Right Rotation  WNL    Lumbar - Left Rotation  WNL      Strength   Strength Assessment Site  Hip;Knee;Ankle    Right Hip Flexion  5/5    Right Hip Extension  4/5    Right Hip ABduction  4+/5    Left Hip Flexion  5/5     Left Hip Extension  4+/5    Left  Hip ABduction  4+/5    Right Knee Flexion  5/5 non-painful    Right Knee Extension  5/5    Left Knee Flexion  5/5    Left Knee Extension  5/5    Right Ankle Dorsiflexion  4+/5    Left Ankle Dorsiflexion  5/5      Flexibility   Soft Tissue Assessment /Muscle Length  yes    Hamstrings  R: 150deg; L: 135deg; SLR tight BLE, L>R    Quadriceps  +Ely's BLE, R>L; neg Thomas test R and L    ITB  neg Ober's R      Palpation   Spinal mobility  hypomobile thoracic and lumbar    Palpation comment  increased rectrictions of R gluteals, especially distal glute max (near sacrum/HS origin); pt very tender to palpation here and reporting recreation of pain      Ambulation/Gait   Ambulation Distance (Feet)  596 Feet 3MWT    Assistive device  None    Gait Pattern  Step-through pattern;Antalgic;Trendelenburg    Gait Comments  antalgia RLE, mild Trendelenberg BLE      Balance   Balance Assessed  Yes      Static Standing Balance   Static Standing - Balance Support  No upper extremity supported    Static Standing Balance -  Activities   Single Leg Stance - Right Leg;Single Leg Stance - Left Leg    Static Standing - Comment/# of Minutes  R: 7 sec, Trunk lean to the R;  L:  4 sec      Standardized Balance Assessment   Standardized Balance Assessment  --            Objective measurements completed on examination: See above findings.         PT Education - 11/24/17 1135    Education provided  Yes    Education Details  exam findings, POC, HEP    Person(s) Educated  Patient    Methods  Explanation;Demonstration;Handout    Comprehension  Verbalized understanding;Returned demonstration       PT Short Term Goals - 11/24/17 1143      PT SHORT TERM GOAL #1   Title  Pt will be independent with HEP and perform consistently in order to decrease pain and maximize overall function.    Time  2    Period  Weeks    Status  New    Target Date  12/08/17      PT  SHORT TERM GOAL #2   Title  Pt will have improved R hip IR ROM to 10 deg or > to decrease pain and maximize pt's gait and golf swing.    Time  2    Period  Weeks    Status  New      PT SHORT TERM GOAL #3   Title  Pt will have improved L HS length to 145deg or > in 90/90 position in order to maximize gait, balance, and completion of other functional tasks.    Time  2    Period  Weeks    Status  New        PT Long Term Goals - 11/24/17 1145      PT LONG TERM GOAL #1   Title  Pt will have 1/2 grade improvement in MMT of proximal hip musculature in order to decrease pain and maximize functional tasks.    Time  4    Period  Weeks    Status  New    Target Date  12/22/17      PT LONG TERM GOAL #2   Title  Pt will be able to perform bil SLS for 15sec or > to demo improved functional hip strength and overall balance.     Time  4    Period  Weeks    Status  New      PT LONG TERM GOAL #3   Title  Pt will tolerate being able to sit for 30 mins or > with 4/10 R hip pain or < to allow him to sit and enjoy a meal with greater ease.    Time  4    Period  Weeks    Status  New      PT LONG TERM GOAL #4   Title  Pt will be able to swing his golf club with 4/10 R hip pain or < to demo improved overall function and allow him to return to playing with greater ease and less pain.    Time  4    Period  Weeks    Status  New             Plan - 11/24/17 1138    Clinical Impression Statement  Pt is pleasant 77YO M who presents to OPPT with c/o R buttock pain for the last 3 months. He thinks it happened when he was playing golf; he swings R handed therefore his RLE is back and has to IR during his swing through. Pt with point-tenderness to R distal glute max, near the HS origin on the sacrum, and reported that palpation to that spot recreated his same pain. Pt also presents with deficits in proximal hip strength, R>L, deficits in flexibility, especially of L HS, as well as deficits in SLS, R  hip IR ROM, and difficulty sitting due to the pain. Pt with 6deg R hip IR compared to 13deg L hip IR; ER ROM WNL BLE. Pt demonstrating increased R lateral trunk lean during R SLS, indicating functional glute med weakness. Ober's and Thomas Test negative, while Ely's test positive. Pt needs skilled PT intervention to address these impairments in order to decrease pain and maximize pt's overall QOL    History and Personal Factors relevant to plan of care:  h/o bladder cancer    Clinical Presentation  Stable    Clinical Presentation due to:  MMT, hip ROM, SLS, gait, soft tissue restrictions, difficulty sitting    Clinical Decision Making  Low    Rehab Potential  Good    PT Frequency  2x / week    PT Duration  4 weeks    PT Treatment/Interventions  ADLs/Self Care Home Management;Cryotherapy;Electrical Stimulation;Moist Heat;Traction;Ultrasound;DME Instruction;Gait training;Stair training;Functional mobility training;Therapeutic activities;Therapeutic exercise;Balance training;Neuromuscular re-education;Patient/family education;Manual techniques;Scar mobilization;Passive range of motion;Dry needling;Energy conservation;Taping    PT Next Visit Plan  review goals and HEP; continue stretching of tight musculature; initiate glute and functional strengthening; IR step around for R hip; manual techniques for improved R hip mobility and for soft tissue restrictions     PT Home Exercise Plan  eval: SKTC, supine HS stretch    Consulted and Agree with Plan of Care  Patient       Patient will benefit from skilled therapeutic intervention in order to improve the following deficits and impairments:  Decreased balance, Decreased range of motion, Decreased strength, Hypomobility, Increased fascial restricitons, Increased muscle spasms, Impaired flexibility, Improper body mechanics, Postural dysfunction, Pain, Decreased activity tolerance  Visit Diagnosis: Pain  in right hip - Plan: PT plan of care  cert/re-cert  Muscle weakness (generalized) - Plan: PT plan of care cert/re-cert  Other symptoms and signs involving the musculoskeletal system - Plan: PT plan of care cert/re-cert     Problem List Patient Active Problem List   Diagnosis Date Noted  . Status post ileal conduit ([place]) 06/29/2017  . Bladder cancer ([place]) 06/28/2017  . Malignant neoplasm of urinary bladder ([place]) 04/20/2017  . COPD UNSPECIFIED 03/07/2008        Geraldine Solar PT, DPT  [place] [address] [place], [place], [address] Phone: [phone]   Fax:  [phone]  Name: [name] MRN: [id number] Date of Birth: 10/05/1940

## 2017-11-25 DIAGNOSIS — C678 Malignant neoplasm of overlapping sites of bladder: Secondary | ICD-10-CM | POA: Diagnosis not present

## 2017-11-25 DIAGNOSIS — R102 Pelvic and perineal pain: Secondary | ICD-10-CM | POA: Diagnosis not present

## 2017-11-28 ENCOUNTER — Telehealth (HOSPITAL_COMMUNITY): Payer: Self-pay | Admitting: Internal Medicine

## 2017-11-28 NOTE — Telephone Encounter (Signed)
11/28/17  I called him because he said he got a call from [place].... he said that he wanted to cx all appts, his dr has found a mass on his hip and thinks that is the problem and he said PT ain't going to help

## 2017-11-29 ENCOUNTER — Other Ambulatory Visit: Payer: Self-pay | Admitting: Urology

## 2017-11-29 ENCOUNTER — Encounter (HOSPITAL_COMMUNITY): Payer: Medicare Other

## 2017-11-29 DIAGNOSIS — M7989 Other specified soft tissue disorders: Secondary | ICD-10-CM

## 2017-12-01 ENCOUNTER — Encounter (HOSPITAL_COMMUNITY): Payer: Medicare Other | Admitting: Physical Therapy

## 2017-12-06 ENCOUNTER — Encounter (HOSPITAL_COMMUNITY): Payer: Medicare Other

## 2017-12-07 ENCOUNTER — Encounter (HOSPITAL_COMMUNITY): Payer: Self-pay

## 2017-12-07 ENCOUNTER — Other Ambulatory Visit: Payer: Self-pay | Admitting: Radiology

## 2017-12-07 NOTE — Therapy (Signed)
[place], [place], [address] Phone: [phone]   Fax:  [phone]  Patient Details  Name: [name] MRN: [id number] Date of Birth: 1941/02/14 Referring Provider:  No ref. provider found  Encounter Date: 12/07/2017    Pt called on 11/28/17 and he said that he wanted to cx all appts, his dr has found a mass on his hip and thinks that is the problem and he said PT ain't going to help.   PHYSICAL THERAPY DISCHARGE SUMMARY  Visits from Start of Care: 1  Current functional level related to goals / functional outcomes: See eval   Remaining deficits: See eval   Education / Equipment: See eval Plan: Patient agrees to discharge.  Patient goals were not met. Patient is being discharged due to the patient's request.  ?????       Geraldine Solar PT, [place] [address] [place], [place], [address] Phone: [phone]   Fax:  [phone]

## 2017-12-08 ENCOUNTER — Encounter (HOSPITAL_COMMUNITY): Payer: Self-pay

## 2017-12-08 ENCOUNTER — Encounter (HOSPITAL_COMMUNITY): Payer: Medicare Other

## 2017-12-08 ENCOUNTER — Ambulatory Visit (HOSPITAL_COMMUNITY)
Admission: RE | Admit: 2017-12-08 | Discharge: 2017-12-08 | Disposition: A | Discharge status: Home / Self Care | Payer: Medicare Other | Source: Ambulatory Visit | Attending: Urology | Admitting: Urology

## 2017-12-08 DIAGNOSIS — Z87891 Personal history of nicotine dependence: Secondary | ICD-10-CM | POA: Diagnosis not present

## 2017-12-08 DIAGNOSIS — M7989 Other specified soft tissue disorders: Secondary | ICD-10-CM

## 2017-12-08 DIAGNOSIS — Z8546 Personal history of malignant neoplasm of prostate: Secondary | ICD-10-CM | POA: Insufficient documentation

## 2017-12-08 DIAGNOSIS — C414 Malignant neoplasm of pelvic bones, sacrum and coccyx: Secondary | ICD-10-CM | POA: Diagnosis not present

## 2017-12-08 DIAGNOSIS — M899 Disorder of bone, unspecified: Secondary | ICD-10-CM | POA: Diagnosis present

## 2017-12-08 DIAGNOSIS — Z881 Allergy status to other antibiotic agents status: Secondary | ICD-10-CM | POA: Diagnosis not present

## 2017-12-08 DIAGNOSIS — Z8551 Personal history of malignant neoplasm of bladder: Secondary | ICD-10-CM | POA: Insufficient documentation

## 2017-12-08 DIAGNOSIS — M799 Soft tissue disorder, unspecified: Secondary | ICD-10-CM | POA: Diagnosis not present

## 2017-12-08 LAB — PROTIME-INR
INR: 0.95
Prothrombin Time: 12.6 seconds (ref 11.4–15.2)

## 2017-12-08 LAB — CBC
HCT: 42.1 % (ref 39.0–52.0)
Hemoglobin: 14 g/dL (ref 13.0–17.0)
MCH: 34.5 pg — ABNORMAL HIGH (ref 26.0–34.0)
MCHC: 33.3 g/dL (ref 30.0–36.0)
MCV: 103.7 fL — ABNORMAL HIGH (ref 78.0–100.0)
Platelets: 235 10*3/uL (ref 150–400)
RBC: 4.06 MIL/uL — ABNORMAL LOW (ref 4.22–5.81)
RDW: 13.6 % (ref 11.5–15.5)
WBC: 11 10*3/uL — ABNORMAL HIGH (ref 4.0–10.5)

## 2017-12-08 MED ORDER — MIDAZOLAM HCL 2 MG/2ML IJ SOLN
INTRAMUSCULAR | Status: AC | PRN
  Administered 2017-12-08: 1 mg via INTRAVENOUS
  Administered 2017-12-08: 0.5 mg via INTRAVENOUS

## 2017-12-08 MED ORDER — FLUMAZENIL 0.5 MG/5ML IV SOLN
INTRAVENOUS | Status: AC
  Filled 2017-12-08: qty 5

## 2017-12-08 MED ORDER — NALOXONE HCL 0.4 MG/ML IJ SOLN
INTRAMUSCULAR | Status: AC
  Filled 2017-12-08: qty 1

## 2017-12-08 MED ORDER — SODIUM CHLORIDE 0.9 % IV SOLN: INTRAVENOUS | Status: DC

## 2017-12-08 MED ORDER — LIDOCAINE HCL 1 % IJ SOLN
INTRAMUSCULAR | Status: AC
  Filled 2017-12-08: qty 20

## 2017-12-08 MED ORDER — FENTANYL CITRATE (PF) 100 MCG/2ML IJ SOLN
INTRAMUSCULAR | Status: AC | PRN
  Administered 2017-12-08 (×2): 50 ug via INTRAVENOUS

## 2017-12-08 MED ORDER — MIDAZOLAM HCL 2 MG/2ML IJ SOLN
INTRAMUSCULAR | Status: AC
  Filled 2017-12-08: qty 4

## 2017-12-08 MED ORDER — FENTANYL CITRATE (PF) 100 MCG/2ML IJ SOLN
INTRAMUSCULAR | Status: AC
  Filled 2017-12-08: qty 4

## 2017-12-08 MED ORDER — HYDROCODONE-ACETAMINOPHEN 5-325 MG PO TABS: 1.0000 | ORAL_TABLET | ORAL | Status: DC | PRN

## 2017-12-08 NOTE — Discharge Instructions (Addendum)
Needle Biopsy of the Bone A bone biopsy is a procedure in which a small sample of bone is removed. The sample is taken with a needle. Then, the bone sample is looked at under a microscope to check for abnormalities. The sample is usually taken from a bone that is close to the skin. This procedure may be done to check for various problems with the bone. You may need this procedure if imaging tests or blood tests have indicated a possible problem. This procedure may be done to help determine if a bone tumor is cancerous (malignant). A bone biopsy can help to diagnose problems such as:  Tumors of the bone (sarcomas) and bone marrow (multiple myeloma).  Bone that forms abnormally (Paget disease).  Noncancerous (benign) bone cysts.  Bony growths.  Infections in the bone.  Tell a health care provider about:  Any allergies you have.  All medicines you are taking, including vitamins, herbs, eye drops, creams, and over-the-counter medicines.  Any problems you or family members have had with anesthetic medicines.  Any blood disorders you have.  Any surgeries you have had.  Any medical conditions you have. What are the risks? Generally, this is a safe procedure. However, problems may occur, including:  Excessive bleeding.  Infection.  Injury to surrounding tissue.  What happens before the procedure?  Ask your health care provider about: ? Changing or stopping your regular medicines. This is especially important if you are taking diabetes medicines or blood thinners. ? Taking medicines such as aspirin and ibuprofen. These medicines can thin your blood. Do not take these medicines before your procedure if your health care provider instructs you not to.  Follow instructions from your health care provider about eating or drinking restrictions.  Plan to have someone take you home after the procedure.  If you go home right after the procedure, plan to have someone with you for 24  hours. What happens during the procedure?  An IV tube may be inserted into one of your veins.  The injection site will be cleaned with a germ-killing solution (antiseptic).  You will be given one or more of the following: ? A medicine to help you relax (sedative). ? A medicine to numb the area (local anesthetic).  The sample of bone will be removed by putting a large needle through the skin and into the bone.  The needle will be removed.  A bandage (dressing) will be placed over the insertion site and taped in place. The procedure may vary among health care providers and hospitals. What happens after the procedure?  Your blood pressure, heart rate, breathing rate, and blood oxygen level will be monitored often until the medicines you were given have worn off.  Return to your normal activities as told by your health care provider. This information is not intended to replace advice given to you by your health care provider. Make sure you discuss any questions you have with your health care provider. Document Released: 06/10/2004 Document Revised: 01/08/2016 Document Reviewed: 09/09/2014 Elsevier Interactive Patient Education  2018 [place]. Moderate Conscious Sedation, Adult, Care After These instructions provide you with information about caring for yourself after your procedure. Your health care provider may also give you more specific instructions. Your treatment has been planned according to current medical practices, but problems sometimes occur. Call your health care provider if you have any problems or questions after your procedure. What can I expect after the procedure? After your procedure, it is common:  To  feel sleepy for several hours.  To feel clumsy and have poor balance for several hours.  To have poor judgment for several hours.  To vomit if you eat too soon.  Follow these instructions at home: For at least 24 hours after the procedure:   Do  not: ? Participate in activities where you could fall or become injured. ? Drive. ? Use heavy machinery. ? Drink alcohol. ? Take sleeping pills or medicines that cause drowsiness. ? Make important decisions or sign legal documents. ? Take care of children on your own.  Rest. Eating and drinking  Follow the diet recommended by your health care provider.  If you vomit: ? Drink water, juice, or soup when you can drink without vomiting. ? Make sure you have little or no nausea before eating solid foods. General instructions  Have a responsible adult stay with you until you are awake and alert.  Take over-the-counter and prescription medicines only as told by your health care provider.  If you smoke, do not smoke without supervision.  Keep all follow-up visits as told by your health care provider. This is important. Contact a health care provider if:  You keep feeling nauseous or you keep vomiting.  You feel light-headed.  You develop a rash.  You have a fever. Get help right away if:  You have trouble breathing. This information is not intended to replace advice given to you by your health care provider. Make sure you discuss any questions you have with your health care provider. Document Released: 05/23/2013 Document Revised: 01/05/2016 Document Reviewed: 11/22/2015 Elsevier Interactive Patient Education  [employer].

## 2017-12-08 NOTE — Procedures (Signed)
  Procedure: CT core biopsy R ischial lytic lesion   EBL:   minimal Complications:  none immediate  See full dictation in [facility].  Dillard Cannon MD Main # [phone] Pager  [phone]

## 2017-12-08 NOTE — H&P (Signed)
Chief Complaint: Patient was seen in consultation today for right iliac bone biopsy at the request of [facility]  Referring Physician(s): Manny,Theodore  Supervising Physician: Arne Cleveland  Patient Status: [facility] - Out-pt  History of Present Illness: [name] is a 77 y.o. male   Hx Bladder cancer 2016 New right hip pain for weeks  Worsening pain  4/12/CT reveals right iliac bone lesion Now scheduled for biopsy    Past Medical History:  Diagnosis Date  . BPH (benign prostatic hypertrophy)   . Dysuria   . GERD (gastroesophageal reflux disease)   . History of adenomatous polyp of colon    tubular adenoma 06/ 2018  . History of bladder cancer 12/2014   urologist-  dr [name]--  dx High Grade TCC without stromal invasion s/p TURBT and intravesical BCG tx/  recurrent 07/2015 (pTa) TCC  repear intravesical BCG tx   . History of hiatal hernia   . History of urethral stricture   . Nocturia     Past Surgical History:  Procedure Laterality Date  . CARDIAC CATHETERIZATION  1997   normal coronary arteries  . CARDIOVASCULAR STRESS TEST  12-14-2005   Low risk perfusion study/  very small scar in the inferior septum from mid ventricle to apex,  no ischemia/  mild inferior septal hypokinesis/  ef 58%  . CATARACT EXTRACTION W/ INTRAOCULAR LENS  IMPLANT, BILATERAL  2011  . COLONOSCOPY  last one 06/ 2018  . CYSTOSCOPY WITH BIOPSY N/A 08/12/2015   Procedure: CYSTOSCOPY WITH BIOPSY AND FULGERATION;  Surgeon: [name], MD;  Location: [facility];  Service: Urology;  Laterality: N/A;  . CYSTOSCOPY WITH INJECTION N/A 06/29/2017   Procedure: CYSTOSCOPY WITH INJECTION OF INDOCYANINE GREEN DYE;  Surgeon: Alexis Frock, MD;  Location: [facility];  Service: Urology;  Laterality: N/A;  . CYSTOSCOPY WITH URETHRAL DILATATION N/A 03/21/2017   Procedure: CYSTOSCOPY;  Surgeon: [name], MD;  Location: [facility];  Service: Urology;   Laterality: N/A;  . ESOPHAGOGASTRODUODENOSCOPY  12-05-2014  . TRANSTHORACIC ECHOCARDIOGRAM  01-31-2008   nomral LVF,  ef 55-65%/  mild pulmonic stenosis without regurg.,  peak transpulomonic valve gradient 56mmHg  . TRANSURETHRAL RESECTION OF BLADDER TUMOR N/A 12/31/2014   Procedure: TRANSURETHRAL RESECTION OF BLADDER TUMOR (TURBT);  Surgeon: Lowella Bandy, MD;  Location: [facility];  Service: Urology;  Laterality: N/A;  . TRANSURETHRAL RESECTION OF BLADDER TUMOR N/A 03/21/2017   Procedure: POSSIBLE TRANSURETHRAL RESECTION OF BLADDER TUMOR (TURBT);  Surgeon: [name], MD;  Location: [facility];  Service: Urology;  Laterality: N/A;    Allergies: Ciprofloxacin  Medications: Prior to Admission medications   Medication Sig Start Date End Date Taking? Authorizing Provider  HYDROcodone-ibuprofen (VICOPROFEN) 7.5-200 MG tablet Take 1 tablet by mouth every 4 (four) hours as needed.  09/13/17  Yes [provider]     Family History  Problem Relation Age of Onset  . Colon cancer Neg Hx   . Colon polyps Neg Hx   . Diabetes Neg Hx   . Kidney disease Neg Hx   . Esophageal cancer Neg Hx   . Heart disease Neg Hx   . Gallbladder disease Neg Hx     Social History   Socioeconomic History  . Marital status: Married    Spouse name: Not on file  . Number of children: 1  . Years of education: Not on file  . Highest education level: Not on file  Occupational History  .  Occupation: Retired  Scientific laboratory technician  . Financial resource strain: Not on file  . Food insecurity:    Worry: Not on file    Inability: Not on file  . Transportation needs:    Medical: Not on file    Non-medical: Not on file  Tobacco Use  . Smoking status: Former Smoker    Packs/day: 1.00    Years: 34.00    Pack years: 34.00    Types: Cigarettes    Last attempt to quit: 12/26/1988    Years since quitting: 28.9  . Smokeless tobacco: Never Used  Substance and Sexual Activity  .  Alcohol use: Yes    Alcohol/week: 3.6 oz    Types: 6 Cans of beer per week    Comment: BEER  . Drug use: No  . Sexual activity: Never  Lifestyle  . Physical activity:    Days per week: Not on file    Minutes per session: Not on file  . Stress: Not on file  Relationships  . Social connections:    Talks on phone: Not on file    Gets together: Not on file    Attends religious service: Not on file    Active member of club or organization: Not on file    Attends meetings of clubs or organizations: Not on file    Relationship status: Not on file  Other Topics Concern  . Not on file  Social History Narrative  . Not on file    Review of Systems: A 12 point ROS discussed and pertinent positives are indicated in the HPI above.  All other systems are negative.  Review of Systems  Constitutional: Positive for activity change. Negative for fatigue and fever.  Respiratory: Negative for cough and shortness of breath.   Cardiovascular: Negative for chest pain.  Gastrointestinal: Negative for abdominal pain.  Musculoskeletal: Positive for gait problem.  Neurological: Negative for weakness.  Psychiatric/Behavioral: Negative for behavioral problems and confusion.    Vital Signs: BP (!) 149/83   Pulse 85   Temp 98.3 F (36.8 C) (Oral)   Resp 16   Ht 5\' 11"  (1.803 m)   Wt 191 lb (86.6 kg)   SpO2 98%   BMI 26.64 kg/m   Physical Exam  Constitutional: He is oriented to person, place, and time.  Cardiovascular: Normal rate, regular rhythm and normal heart sounds.  Pulmonary/Chest: Effort normal and breath sounds normal.  Abdominal: Soft. Bowel sounds are normal.  Musculoskeletal: Normal range of motion. He exhibits tenderness.  Right hip pain  Neurological: He is alert and oriented to person, place, and time.  Skin: Skin is warm and dry.  Psychiatric: He has a normal mood and affect. His behavior is normal. Judgment and thought content normal.  Nursing note and vitals  reviewed.   Imaging: No results found.  Labs:  CBC: Recent Labs    07/02/17 0504 07/03/17 0509 07/04/17 0457 12/08/17 0542  WBC 9.0 8.9 7.3 11.0*  HGB 10.5* 9.4* 9.7* 14.0  HCT 30.9* 27.8* 28.4* 42.1  PLT 206 188 213 235    COAGS: Recent Labs    12/08/17 0542  INR 0.95    BMP: Recent Labs    07/01/17 0434 07/02/17 0504 07/03/17 0509 07/04/17 0457  NA 140 140 138 138  K 3.3* 3.0* 3.2* 2.9*  CL 107 105 105 103  CO2 29 29 29 31   GLUCOSE 87 97 108* 107*  BUN 14 8 6  <5*  CALCIUM 8.0* 8.2* 8.0* 8.2*  CREATININE 1.16 1.01 0.89 0.84  GFRNONAA 59* >60 >60 >60  GFRAA >60 >60 >60 >60    LIVER FUNCTION TESTS: Recent Labs    05/02/17 0757 05/09/17 0814 05/23/17 0828 06/28/17 1200  BILITOT 0.74 0.94 0.37 0.8  AST 12 12 13  11*  ALT 15 21 15  12*  ALKPHOS 71 68 57 63  PROT 5.5* 5.8* 5.6* 5.2*  ALBUMIN 2.7* 2.6* 2.5* 2.8*    TUMOR MARKERS: No results for input(s): AFPTM, CEA, CA199, CHROMGRNA in the last 8760 hours.  Assessment and Plan:  Hx Bladder Ca New right pain and lesion noted on imaging Now scheduled for biopsy of right iliac bone lesion Risks and benefits discussed with the patient including, but not limited to bleeding, infection, damage to adjacent structures or low yield requiring additional tests.  All of the patient's questions were answered, patient is agreeable to proceed. Consent signed and in chart.   Thank you for this interesting consult.  I greatly enjoyed meeting [name] and look forward to participating in their care.  A copy of this report was sent to the requesting provider on this date.  Electronically Signed: Lavonia Drafts, PA-C 12/08/2017, 7:08 AM   I spent a total of  30 Minutes   in face to face in clinical consultation, greater than 50% of which was counseling/coordinating care for right iliac bone lesion bx

## 2017-12-13 ENCOUNTER — Ambulatory Visit (HOSPITAL_COMMUNITY): Payer: Medicare Other

## 2017-12-13 ENCOUNTER — Encounter (HOSPITAL_COMMUNITY): Payer: Medicare Other | Admitting: Physical Therapy

## 2017-12-15 ENCOUNTER — Encounter (HOSPITAL_COMMUNITY): Payer: Medicare Other

## 2017-12-16 ENCOUNTER — Telehealth: Payer: Self-pay | Admitting: Oncology

## 2017-12-16 NOTE — Telephone Encounter (Signed)
Scheduled appt per 5/3 sch message - pt is aware of appt date and time   

## 2017-12-20 ENCOUNTER — Encounter (HOSPITAL_COMMUNITY): Payer: Medicare Other

## 2017-12-22 ENCOUNTER — Encounter (HOSPITAL_COMMUNITY): Payer: Medicare Other

## 2017-12-26 ENCOUNTER — Inpatient Hospital Stay: Payer: Medicare Other | Attending: Oncology | Admitting: Oncology

## 2017-12-26 ENCOUNTER — Inpatient Hospital Stay: Payer: Medicare Other

## 2017-12-26 ENCOUNTER — Encounter: Payer: Self-pay | Admitting: Radiation Oncology

## 2017-12-26 ENCOUNTER — Telehealth: Payer: Self-pay | Admitting: Oncology

## 2017-12-26 VITALS — BP 131/97 | HR 129 | Temp 97.8°F | Resp 18 | Ht 71.0 in | Wt 192.3 lb

## 2017-12-26 DIAGNOSIS — K5900 Constipation, unspecified: Secondary | ICD-10-CM | POA: Insufficient documentation

## 2017-12-26 DIAGNOSIS — Z9221 Personal history of antineoplastic chemotherapy: Secondary | ICD-10-CM | POA: Insufficient documentation

## 2017-12-26 DIAGNOSIS — Z79899 Other long term (current) drug therapy: Secondary | ICD-10-CM | POA: Diagnosis not present

## 2017-12-26 DIAGNOSIS — C679 Malignant neoplasm of bladder, unspecified: Secondary | ICD-10-CM

## 2017-12-26 DIAGNOSIS — C7951 Secondary malignant neoplasm of bone: Secondary | ICD-10-CM | POA: Diagnosis not present

## 2017-12-26 DIAGNOSIS — G893 Neoplasm related pain (acute) (chronic): Secondary | ICD-10-CM | POA: Diagnosis not present

## 2017-12-26 LAB — COMPREHENSIVE METABOLIC PANEL
ALT: 41 U/L (ref 0–55)
AST: 22 U/L (ref 5–34)
Albumin: 2.9 g/dL — ABNORMAL LOW (ref 3.5–5.0)
Alkaline Phosphatase: 71 U/L (ref 40–150)
Anion gap: 10 (ref 3–11)
BUN: 21 mg/dL (ref 7–26)
CO2: 26 mmol/L (ref 22–29)
Calcium: 10.1 mg/dL (ref 8.4–10.4)
Chloride: 105 mmol/L (ref 98–109)
Creatinine, Ser: 0.85 mg/dL (ref 0.70–1.30)
GFR calc Af Amer: >60 mL/min (ref >60)
GFR calc non Af Amer: >60 mL/min (ref >60)
Glucose, Bld: 119 mg/dL (ref 70–140)
Potassium: 4.1 mmol/L (ref 3.5–5.1)
Sodium: 141 mmol/L (ref 136–145)
Total Bilirubin: 0.5 mg/dL (ref 0.2–1.2)
Total Protein: 6.9 g/dL (ref 6.4–8.3)

## 2017-12-26 LAB — CBC WITH DIFFERENTIAL/PLATELET
Basophils Absolute: 0.1 10*3/uL (ref 0.0–0.1)
Basophils Relative: 1 %
Eosinophils Absolute: 0.1 10*3/uL (ref 0.0–0.5)
Eosinophils Relative: 1 %
HCT: 41.5 % (ref 38.4–49.9)
Hemoglobin: 14.1 g/dL (ref 13.0–17.1)
Lymphocytes Relative: 8 %
Lymphs Abs: 1.1 10*3/uL (ref 0.9–3.3)
MCH: 34.7 pg — ABNORMAL HIGH (ref 27.2–33.4)
MCHC: 34.1 g/dL (ref 32.0–36.0)
MCV: 102 fL — ABNORMAL HIGH (ref 79.3–98.0)
Monocytes Absolute: 1.4 10*3/uL — ABNORMAL HIGH (ref 0.1–0.9)
Monocytes Relative: 11 %
Neutro Abs: 10.5 10*3/uL — ABNORMAL HIGH (ref 1.5–6.5)
Neutrophils Relative %: 79 %
Platelets: 350 10*3/uL (ref 140–400)
RBC: 4.07 MIL/uL — ABNORMAL LOW (ref 4.20–5.82)
RDW: 13.6 % (ref 11.0–14.6)
WBC: 13.2 10*3/uL — ABNORMAL HIGH (ref 4.0–10.3)

## 2017-12-26 MED ORDER — HYDROCODONE-IBUPROFEN 7.5-200 MG PO TABS: 2.0000 | ORAL_TABLET | Freq: Four times a day (QID) | ORAL | 0 refills | Status: DC | PRN

## 2017-12-26 NOTE — Telephone Encounter (Signed)
Scheduled apt per 5/13 los - gave patient AVS and calender per los. Central radiology to contact patient with ct scan .

## 2017-12-26 NOTE — Telephone Encounter (Signed)
Scheduled appt per 5/13 los - Gave patient AVS and calender per los.  

## 2017-12-26 NOTE — Progress Notes (Signed)
Hematology and Oncology Follow Up Visit  [name] [id number] September 16, 1940 77 y.o. 12/26/2017 9:58 AM [name], MDFusco, Purcell Nails, MD   Principle Diagnosis: 77 year-old man with T3b high-grade urothelial carcinoma arising from the bladder diagnosed in August 2018.  He developed metastatic disease in April 2019 with documented right pelvic mass.  Prior Therapy:   He is status post neoadjuvant chemotherapy utilizing gemcitabine and cisplatin started in September 2018.  He received day 1 of cycle 1 on 05/02/2017 and therapy discontinued after that.  He is status post laparoscopic Cystoprostatectomy and bilateral lymphadenectomy done by Dr. Tresa Moore on 06/29/2017.  A final pathology showed a T3bN0 disease.  He has 13 lymph nodes sampled without any evidence of malignancy.  Current therapy: Under consideration to start salvage therapy.  Interim History: [name] presents today for a follow-up visit.  Since the last visit, he continued to develop worsening right-sided hip pain and despite imaging studies of his pelvis and back no clear etiology has been identified.  He subsequently underwent a CT scan of the abdomen and pelvis which was completed on November 25, 2017.  CT scan showed new lytic lesion in the right ischial tuberosity measuring 4.4 x 3.9 cm.  He was prescribed hydrocodone by his primary care physician which he has been taking every 6 hours.  Despite that pain medication he still has pain issues that is affecting his quality of life.  He still ambulates without any difficulties.  He denies any neurological deficits.  He does report constipation issues which she takes Senokot which was not effective.  He has started taking MiraLAX which was more effective and having regular bowel movements.  His appetite and performance status remains unchanged.   He does not report any headaches, blurry vision, syncope or seizures. He does not report any fevers, chills or sweats. He does not report any  cough, wheezing or hemoptysis. He does not report any chest pain, palpitation, orthopnea or leg edema. He does not report any nausea, vomiting, abdominal pain,diarrhea, hematochezia or melena. He does not report any frequency urgency or hesitancy. He does not report any petechia or rash.  Denies any petechiae or lymphadenopathy.  Denies any anxiety or depression.  Denies any heat or cold intolerance.  Remaining review of systems is negative.  Medications: I have reviewed the patient's current medications.  Current Outpatient Medications  Medication Sig Dispense Refill  . HYDROcodone-ibuprofen (VICOPROFEN) 7.5-200 MG tablet Take 2 tablets by mouth every 6 (six) hours as needed. 180 tablet 0   No current facility-administered medications for this visit.      Allergies:  Allergies  Allergen Reactions  . Ciprofloxacin Hives    Past Medical History, Surgical history, Social history, and Family History were reviewed and updated.  Physical Exam: Blood pressure (!) 131/97, pulse (!) 129, temperature 97.8 F (36.6 C), temperature source Oral, resp. rate 18, height 5\' 11"  (1.803 m), weight 192 lb 4.8 oz (87.2 kg), SpO2 98 %. ECOG: 1 General appearance: Well-appearing gentleman appeared comfortable. Head: Traumatic without abnormalities. Oropharynx: No oral thrush or ulcers. Eyes: No scleral icterus.  Lymph nodes: Cervical, supraclavicular, and axillary nodes are not enlarged. Heart: Regular rate and rhythm without any murmurs or gallops. Lung: Faythe Dingwall to auscultation without any rhonchi, wheezes or dullness to percussion. Abdomin: No rebound or guarding.  No shifting dullness or ascites.  Soft without tenderness. Skeletal: Limited range of motion in his right hip.  No clubbing or cyanosis. Skin no ecchymosis or petechiae. Neurological: No  motor or sensory deficits.   Lab Results: Lab Results  Component Value Date   WBC 13.2 (H) 12/26/2017   HGB 14.1 12/26/2017   HCT 41.5 12/26/2017   MCV  102.0 (H) 12/26/2017   PLT 350 12/26/2017     Chemistry      Component Value Date/Time   NA 138 07/04/2017 0457   NA 141 05/23/2017 0828   K 2.9 (L) 07/04/2017 0457   K 4.0 05/23/2017 0828   CL 103 07/04/2017 0457   CO2 31 07/04/2017 0457   CO2 27 05/23/2017 0828   BUN <5 (L) 07/04/2017 0457   BUN 12.5 05/23/2017 0828   CREATININE 0.84 07/04/2017 0457   CREATININE 0.8 05/23/2017 0828      Component Value Date/Time   CALCIUM 8.2 (L) 07/04/2017 0457   CALCIUM 9.0 05/23/2017 0828   ALKPHOS 63 06/28/2017 1200   ALKPHOS 57 05/23/2017 0828   AST 11 (L) 06/28/2017 1200   AST 13 05/23/2017 0828   ALT 12 (L) 06/28/2017 1200   ALT 15 05/23/2017 0828   BILITOT 0.8 06/28/2017 1200   BILITOT 0.37 05/23/2017 0828       Impression and Plan:  77 year old man with  1.  T3b high-grade urothelial carcinoma arising from the bladder diagnosed in August 2018.  He received part of cycle 1 of neoadjuvant chemotherapy utilizing cisplatin and gemcitabine and subsequently underwent cystoprostatectomy in November 2018.  He developed a biopsy-proven urothelial carcinoma involving the right ischial tuberosity confirmed on December 08, 2017.  The natural course of this disease was discussed today with the patient and his family.  Imaging studies of his abdomen and pelvis obtained in April 2019 was compared to previous scans done previously.  At this time, he is clearly developed a stage IV disease and imaging studies of the chest would be next to document complete staging work-up.  He will likely require systemic chemotherapy for a palliative purposes after completing his staging work-up.  Given his right hip pain causing major pain issues, I will refer him to radiation oncology for palliative treatment first before pursuing systemic therapy.  2.  Right-sided hip pain: Related to metastatic disease to the right ischial tuberosity.  Prescription for hydrocodone was refilled for him with a referral to  radiation oncology.  3.  Constipation: Clear instruction was given to him about maintaining adequate stool softeners on a daily basis.  He has MiraLAX which she will take daily.  4. Renal function: His creatinine remained at baseline and would be a candidate for aggressive systemic chemotherapy.  5.  Goals of care: This was discussed today in detail with the patient and his family.  It is unlikely that would be able to cure this cancer and treatment would be palliative at this time.  The goals of care is to control his pain long-term.  6. Follow-up: In the next 2 to 3 weeks to follow his progress.  25  minutes was spent with the patient face-to-face today.  More than 50% of time was dedicated to patient counseling, education and answering questions regarding diagnosis, prognosis and future plan of care.   Zola Button, MD 5/13/20199:58 AM

## 2017-12-27 ENCOUNTER — Telehealth: Payer: Self-pay | Admitting: *Deleted

## 2017-12-27 ENCOUNTER — Encounter: Payer: Self-pay | Admitting: Radiation Oncology

## 2017-12-27 NOTE — Telephone Encounter (Signed)
Faxed ROI to [facility]; release [phone]

## 2017-12-27 NOTE — Progress Notes (Signed)
Histology and Location of Primary Cancer: T3b high-grade urothelial carcinoma arising from the bladder diagnosed in August 2018.  Sites of Visceral and Bony Metastatic Disease: bony  Location(s) of Symptomatic Metastases: right ischial tuberosity  Past/Anticipated chemotherapy by medical oncology, if any: He received part of cycle 1 of neoadjuvant chemotherapy utilizing cisplatin and gemcitabine and subsequently underwent cystoprostatectomy in November 2018   Pain on a scale of 0-10 is: 10    If Spine Met(s), symptoms, if any, include:  Bowel/Bladder retention or incontinence (please describe): constipation related to effects of pain medication managed with senokot and miralax  Numbness or weakness in extremities (please describe): no  Current Decadron regimen, if applicable: no  Ambulatory status? Walker? Wheelchair?: Ambulatory  SAFETY ISSUES:  Prior radiation? no  Pacemaker/ICD? no  Possible current pregnancy? no  Is the patient on methotrexate? no  Current Complaints / other details:  77 year old male. Married. Accompanied by wife and daughter today.   Reports severe right hip pain 10 on a scale of 0-10. Reports it hurts to bad to sit down. Reports oxycodone, tramadol nor hydrocodone-ibuprofen worked to relieve his pain. Reports taking hydrocodone-acetaminophen 5/325 two tablets every six hours works only to relieve his pain for 4 or 5 hours if he doesn't move. Discussed colace bid and miralax every day to manage constipation related to effects of pain medication. Encouraged use of laxative if no BM in 2-3 days. Provided patient with constipation management handouts.

## 2017-12-29 ENCOUNTER — Ambulatory Visit
Admission: RE | Admit: 2017-12-29 | Discharge: 2017-12-29 | Disposition: A | Discharge status: Home / Self Care | Payer: Medicare Other | Source: Ambulatory Visit | Attending: Radiation Oncology | Admitting: Radiation Oncology

## 2017-12-29 ENCOUNTER — Other Ambulatory Visit: Payer: Self-pay

## 2017-12-29 ENCOUNTER — Encounter: Payer: Self-pay | Admitting: Radiation Oncology

## 2017-12-29 VITALS — BP 137/86 | HR 117 | Temp 98.1°F | Resp 20 | Ht 71.0 in | Wt 193.0 lb

## 2017-12-29 DIAGNOSIS — K219 Gastro-esophageal reflux disease without esophagitis: Secondary | ICD-10-CM | POA: Diagnosis not present

## 2017-12-29 DIAGNOSIS — K449 Diaphragmatic hernia without obstruction or gangrene: Secondary | ICD-10-CM | POA: Diagnosis not present

## 2017-12-29 DIAGNOSIS — C7951 Secondary malignant neoplasm of bone: Secondary | ICD-10-CM | POA: Diagnosis not present

## 2017-12-29 DIAGNOSIS — C7952 Secondary malignant neoplasm of bone marrow: Principal | ICD-10-CM

## 2017-12-29 DIAGNOSIS — N35919 Unspecified urethral stricture, male, unspecified site: Secondary | ICD-10-CM | POA: Insufficient documentation

## 2017-12-29 DIAGNOSIS — K5900 Constipation, unspecified: Secondary | ICD-10-CM | POA: Diagnosis not present

## 2017-12-29 DIAGNOSIS — G893 Neoplasm related pain (acute) (chronic): Secondary | ICD-10-CM | POA: Diagnosis not present

## 2017-12-29 DIAGNOSIS — R918 Other nonspecific abnormal finding of lung field: Secondary | ICD-10-CM | POA: Insufficient documentation

## 2017-12-29 DIAGNOSIS — Z8551 Personal history of malignant neoplasm of bladder: Secondary | ICD-10-CM | POA: Diagnosis not present

## 2017-12-29 DIAGNOSIS — Z86010 Personal history of colonic polyps: Secondary | ICD-10-CM | POA: Insufficient documentation

## 2017-12-29 DIAGNOSIS — C679 Malignant neoplasm of bladder, unspecified: Secondary | ICD-10-CM | POA: Diagnosis not present

## 2017-12-29 DIAGNOSIS — Z85828 Personal history of other malignant neoplasm of skin: Secondary | ICD-10-CM | POA: Insufficient documentation

## 2017-12-29 DIAGNOSIS — Z87891 Personal history of nicotine dependence: Secondary | ICD-10-CM | POA: Diagnosis not present

## 2017-12-29 HISTORY — DX: Malignant neoplasm of bladder, unspecified: C67.9

## 2017-12-29 MED ORDER — HYDROMORPHONE HCL 4 MG PO TABS
4.0000 mg | ORAL_TABLET | ORAL | 0 refills | Status: DC | PRN
Start: 2017-12-29 — End: 2018-01-16

## 2017-12-29 NOTE — Progress Notes (Signed)
Radiation Oncology         [phone] ________________________________  Initial Outpatient Consultation  Name: [name] MRN: [id number]  Date of Service: 12/29/2017 DOB: 04/29/1941  QQ:VZDGL, Purcell Nails, MD  [name], MD   REFERRING PHYSICIAN: [name], MD  DIAGNOSIS: 77 y.o. male with stage IV, (pT3bN0) high-grade urothelial carcinoma of the bladder with painful metastasis to the right ischial bone    ICD-10-CM   1. Malignant neoplasm of urinary bladder, unspecified site [facility]) C67.9 Ambulatory referral to Social Work  2. Bone metastasis (HCC) C79.51     HISTORY OF PRESENT ILLNESS: [name] is a 77 y.o. male seen at the request of Dr. Alen Blew for new bony metastatic disease.  He has a history of pT3bN0 high grade urothelial carcinoma of the bladder diagnosed initially in August 2018.  He was started on neoadjuvant chemotherapy with cisplatin and gemcitabine but discontinued after 1 cycle due to intolerability.  He proceeded with radical cystoprostatectomy with bilateral pelvic lymph node dissection with Dr. Tresa Moore on 06/29/2017.  Final pathology revealed a pT3bN0 high-grade urothelial carcinoma.  He has remained under close observation with Dr. Tresa Moore.  Subsequently, he developed right hip pain 4-5 months ago which has progressively worsened.  He had a CT of the abdomen and pelvis on 11/25/2017 at [facility] which showed a 4.4 x 3.9 cm lytic lesion in the right ischial tuberosity, likely the source of his pain.  There were also noted subcentimeter pulmonary nodules in bilateral lung bases but no pathologically enlarged lymph nodes in the abdomen or pelvis.  He underwent a CT-guided core needle biopsy on 12/08/2017 which confirmed poorly differentiated carcinoma consistent with metastatic urothelial carcinoma.  He has been using Vicodin as needed for pain relief but reports that this only provides minimal, temporary relief and is becoming less effective.  More recently,  he tried Percocet and Oxy IR which again only provided minimal relief and cause itching.  He continues with pain which is significantly affecting his quality of life. The pain is worse with sitting and is improved with standing or walking. He denies numbness or tingling in the LEs or loss of bowel or bladder function. He has not noted any focal weakness in the LEs. Prior to the onset of pain in the right buttock, he had been able to remain quite active with gardening, yard work, home improvement projects, [employer], etc.  He is referred today to discuss the potential role of palliative radiotherapy in the management of his painful bony metastatic disease. He is accompanied by his wife and daughter. He has a planned follow-up with Dr. Alen Blew on January 18, 2018 to consider starting systemic therapy at that time.  PREVIOUS RADIATION THERAPY: No  PAST MEDICAL HISTORY:  Past Medical History:  Diagnosis Date  . Bladder cancer ([place])   . BPH (benign prostatic hypertrophy)   . Dysuria   . GERD (gastroesophageal reflux disease)   . History of adenomatous polyp of colon    tubular adenoma 06/ 2018  . History of bladder cancer 12/2014   urologist-  dr [name]--  dx High Grade TCC without stromal invasion s/p TURBT and intravesical BCG tx/  recurrent 07/2015 (pTa) TCC  repear intravesical BCG tx   . History of hiatal hernia   . History of urethral stricture   . Nocturia       PAST SURGICAL HISTORY: Past Surgical History:  Procedure Laterality Date  . [place]   normal  coronary arteries  . CARDIOVASCULAR STRESS TEST  12-14-2005   Low risk perfusion study/  very small scar in the inferior septum from mid ventricle to apex,  no ischemia/  mild inferior septal hypokinesis/  ef 58%  . CATARACT EXTRACTION W/ INTRAOCULAR LENS  IMPLANT, BILATERAL  2011  . COLONOSCOPY  last one 06/ 2018  . CYSTOSCOPY WITH BIOPSY N/A 08/12/2015   Procedure: CYSTOSCOPY WITH BIOPSY AND FULGERATION;   Surgeon: [name], MD;  Location: [facility];  Service: Urology;  Laterality: N/A;  . CYSTOSCOPY WITH INJECTION N/A 06/29/2017   Procedure: CYSTOSCOPY WITH INJECTION OF INDOCYANINE GREEN DYE;  Surgeon: Alexis Frock, MD;  Location: [facility];  Service: Urology;  Laterality: N/A;  . CYSTOSCOPY WITH URETHRAL DILATATION N/A 03/21/2017   Procedure: CYSTOSCOPY;  Surgeon: [name], MD;  Location: [facility];  Service: Urology;  Laterality: N/A;  . ESOPHAGOGASTRODUODENOSCOPY  12-05-2014  . TRANSTHORACIC ECHOCARDIOGRAM  01-31-2008   nomral LVF,  ef 55-65%/  mild pulmonic stenosis without regurg.,  peak transpulomonic valve gradient 13mmHg  . TRANSURETHRAL RESECTION OF BLADDER TUMOR N/A 12/31/2014   Procedure: TRANSURETHRAL RESECTION OF BLADDER TUMOR (TURBT);  Surgeon: Lowella Bandy, MD;  Location[facility];  Service: Urology;  Laterality: N/A;  . TRANSURETHRAL RESECTION OF BLADDER TUMOR N/A 03/21/2017   Procedure: POSSIBLE TRANSURETHRAL RESECTION OF BLADDER TUMOR (TURBT);  Surgeon: [name], MD;  Location: [facility];  Service: Urology;  Laterality: N/A;    FAMILY HISTORY:  Family History  Problem Relation Age of Onset  . Colon cancer Neg Hx   . Colon polyps Neg Hx   . Diabetes Neg Hx   . Kidney disease Neg Hx   . Esophageal cancer Neg Hx   . Heart disease Neg Hx   . Gallbladder disease Neg Hx     SOCIAL HISTORY:  Social History   Socioeconomic History  . Marital status: Married    Spouse name: Not on file  . Number of children: 1  . Years of education: Not on file  . Highest education level: Not on file  Occupational History  . Occupation: Retired  Scientific laboratory technician  . Financial resource strain: Not on file  . Food insecurity:    Worry: Not on file    Inability: Not on file  . Transportation needs:    Medical: Not on file    Non-medical: Not on file  Tobacco Use  . Smoking status: Former Smoker      Packs/day: 1.00    Years: 34.00    Pack years: 34.00    Types: Cigarettes    Last attempt to quit: 12/26/1988    Years since quitting: 29.0  . Smokeless tobacco: Never Used  Substance and Sexual Activity  . Alcohol use: Yes    Alcohol/week: 3.6 oz    Types: 6 Cans of beer per week    Comment: BEER  . Drug use: No  . Sexual activity: Never  Lifestyle  . Physical activity:    Days per week: Not on file    Minutes per session: Not on file  . Stress: Not on file  Relationships  . Social connections:    Talks on phone: Not on file    Gets together: Not on file    Attends religious service: Not on file    Active member of club or organization: Not on file    Attends meetings of clubs or organizations: Not on file  Relationship status: Not on file  . Intimate partner violence:    Fear of current or ex partner: Not on file    Emotionally abused: Not on file    Physically abused: Not on file    Forced sexual activity: Not on file  Other Topics Concern  . Not on file  Social History Narrative  . Not on file    ALLERGIES: Ciprofloxacin  MEDICATIONS:  Current Outpatient Medications  Medication Sig Dispense Refill  . HYDROcodone-acetaminophen (NORCO/VICODIN) 5-325 MG tablet      No current facility-administered medications for this encounter.     REVIEW OF SYSTEMS:  On review of systems, the patient reports that he is doing well overall. He denies any chest pain, shortness of breath, cough, fevers, chills, night sweats, or unintended weight changes. He reports constipation related to effects of pain medication, managed with senokot and miralax. He denies any bladder disturbances, and denies abdominal pain, nausea or vomiting. He reports 10/10 right pelvic pain, worse with sitting and bending over. He states, "it hurts too bad to sit." His pain is unrelieved by oxycodone, tramadol, or hydrocodone-ibuprofen. He states that taking two tablets of hydrocodone-acetaminophen every 6  hours works to relieve his pain for 4-5 hours, if he is inactive. He remains ambulatory. He denies any numbness or weakness in his extremities. A complete review of systems is obtained and is otherwise negative.    PHYSICAL EXAM:  Wt Readings from Last 3 Encounters:  12/29/17 193 lb (87.5 kg)  12/26/17 192 lb 4.8 oz (87.2 kg)  12/08/17 191 lb (86.6 kg)   Temp Readings from Last 3 Encounters:  12/29/17 98.1 F (36.7 C) (Oral)  12/26/17 97.8 F (36.6 C) (Oral)  12/08/17 98.2 F (36.8 C) (Oral)   BP Readings from Last 3 Encounters:  12/29/17 137/86  12/26/17 (!) 131/97  12/08/17 (!) 142/77   Pulse Readings from Last 3 Encounters:  12/29/17 (!) 117  12/26/17 (!) 129  12/08/17 88   Pain Assessment Pain Score: 10-Worst pain ever/10  In general this is a well appearing caucasian male in no acute distress. He is alert and oriented x4 and appropriate throughout the examination. HEENT reveals that the patient is normocephalic, atraumatic. EOMs are intact. PERRLA. Skin is intact without any evidence of gross lesions. Cardiovascular exam reveals a regular rate and rhythm, no clicks rubs or murmurs are auscultated. Chest is clear to auscultation bilaterally. Lymphatic assessment is performed and does not reveal any adenopathy in the cervical, supraclavicular, axillary, or inguinal chains. Abdomen has active bowel sounds in all quadrants and is intact. The abdomen is soft, non tender, non distended. Lower extremities are negative for pretibial pitting edema, deep calf tenderness, cyanosis or clubbing.   KPS = 80  100 - Normal; no complaints; no evidence of disease. 90   - Able to carry on normal activity; minor signs or symptoms of disease. 80   - Normal activity with effort; some signs or symptoms of disease. 50   - Cares for self; unable to carry on normal activity or to do active work. 60   - Requires occasional assistance, but is able to care for most of his personal needs. 50   -  Requires considerable assistance and frequent medical care. 29   - Disabled; requires special care and assistance. 15   - Severely disabled; hospital admission is indicated although death not imminent. 66   - Very sick; hospital admission necessary; active supportive treatment necessary. 10   -  Moribund; fatal processes progressing rapidly. 0     - Dead  Karnofsky DA, Abelmann [place], Craver LS and Burchenal JH [phone]) The use of the nitrogen mustards in the palliative treatment of carcinoma: with particular reference to bronchogenic carcinoma Cancer 1 [phone]  LABORATORY DATA:  Lab Results  Component Value Date   WBC 13.2 (H) 12/26/2017   HGB 14.1 12/26/2017   HCT 41.5 12/26/2017   MCV 102.0 (H) 12/26/2017   PLT 350 12/26/2017   Lab Results  Component Value Date   NA 141 12/26/2017   K 4.1 12/26/2017   CL 105 12/26/2017   CO2 26 12/26/2017   Lab Results  Component Value Date   ALT 41 12/26/2017   AST 22 12/26/2017   ALKPHOS 71 12/26/2017   BILITOT 0.5 12/26/2017     RADIOGRAPHY: Ct Biopsy  Result Date: 12/08/2017 CLINICAL DATA:  Lytic lesion in the right ischial tuberosity. History of bladder carcinoma and prostate carcinoma. EXAM: CT GUIDED DEEP CORE BONE BIOPSY OF RIGHT ISCHIAL LESION ANESTHESIA/SEDATION: Intravenous Fentanyl and Versed were administered as conscious sedation during continuous monitoring of the patient's level of consciousness and physiological / cardiorespiratory status by the radiology RN, with a total moderate sedation time of 10 minutes. PROCEDURE: The procedure risks, benefits, and alternatives were explained to the patient. Questions regarding the procedure were encouraged and answered. The patient understands and consents to the procedure. Patient placed prone. Select axial scans through the pelvis obtained in the lytic right ischial lesion localized. An appropriate skin entry site was determined and marked. The site was prepped with chlorhexidinein a sterile  fashion, and a sterile drape was applied covering the operative field. A sterile gown and sterile gloves were used for the procedure. Local anesthesia was provided with 1% Lidocaine. Under CT fluoroscopic guidance, a 11 gauge Cook bone trocar needle was advanced to the margin of the lesion. Once needle tip position was confirmed, coaxial 13-gauge core biopsy samples were obtained, with a final core via the guide needle itself, all submitted in formalin to surgical pathology. The guide needle was removed. Postprocedure scans show no hemorrhage or other apparent complication. The patient tolerated the procedure well. COMPLICATIONS: None immediate FINDINGS: Lytic expansile soft tissue lesion in the right ischium was localized. Representative core biopsy samples obtained as above. IMPRESSION: 1. Technically successful CT-guided core biopsy, lytic right ischial bone lesion. Electronically Signed   By: Lucrezia Europe M.D.   On: 12/08/2017 09:30      IMPRESSION/PLAN: 1. 77 y.o. with stage IV, (pT3bN0) high-grade urothelial carcinoma of the bladder with painful metastasis to bone. Today, we talked to the patient and family about the findings and workup thus far. We discussed the natural history of metastatic urothelial carcinoma and general treatment, highlighting the role of palliative radiotherapy in the management of painful bony metastasis. We discussed the available radiation techniques, and focused on the details of logistics and delivery.  The recommendation is for a course of 10 daily treatments to the lesion in the right ischial tuberosity delivered over a period of 2 weeks.  We reviewed the anticipated acute and late sequelae associated with radiation in this setting. The patient was encouraged to ask questions that were answered to his satisfaction.   At the conclusion of our conversation, the patient elects to proceed with palliative radiotherapy to the lesion in the right ischial tuberosity.  He has freely  signed written consent to proceed today in the office and a copy of this document has been  placed in his medical record.  He is scheduled for CT simulation/treatment planning tomorrow 12/30/17 at 2:00 PM  in anticipation of beginning treatments on Monday 01/02/18.  2. Right pelvic pain. Secondary to bony metastatic disease as noted above.  He has not had significant relief with Vicodin, Percocet or Tramadol.  We will triall a prescription for dilaudid 4mg  po q 4hrs prn severe pain.  Prescription has been sent to patient's pharmacy.  3. Constipation secondary to use of narcotic pain medications.  Patient has been provided with written instructions for over-the-counter medications to help relieve his constipation.  These were reviewed with the nurse and he appears to have a good understanding.  We are hopeful that with his radiation treatments, he will get significant pain relief and be able to come off the narcotic pain medications.  We will continue to follow this throughout his course of treatment and make adjustments to his medication regimen as needed.  We spent 60 minutes face to face with the patient and more than 50% of that time was spent in counseling and/or coordination of care.    Nicholos Johns, PA-C    Tyler Pita, MD  [facility] Direct Dial: [phone]  Fax: [phone] [email or web address]  Skype  [email or web address]  This document serves as a record of services personally performed by Tyler Pita, MD and Freeman Caldron, PA-C. It was created on their behalf by Rae Lips, a trained medical scribe. The creation of this record is based on the scribe's personal observations and the providers' statements to them. This document has been checked and approved by the attending providers.

## 2017-12-29 NOTE — Progress Notes (Signed)
See progress note under physician encounter. 

## 2017-12-30 ENCOUNTER — Telehealth: Payer: Self-pay | Admitting: Radiation Oncology

## 2017-12-30 ENCOUNTER — Ambulatory Visit
Admission: RE | Admit: 2017-12-30 | Discharge: 2017-12-30 | Disposition: A | Discharge status: Home / Self Care | Payer: Medicare Other | Source: Ambulatory Visit | Attending: Radiation Oncology | Admitting: Radiation Oncology

## 2017-12-30 DIAGNOSIS — C679 Malignant neoplasm of bladder, unspecified: Secondary | ICD-10-CM | POA: Diagnosis not present

## 2017-12-30 DIAGNOSIS — Z510 Encounter for antineoplastic radiation therapy: Secondary | ICD-10-CM | POA: Diagnosis not present

## 2017-12-30 DIAGNOSIS — C7951 Secondary malignant neoplasm of bone: Secondary | ICD-10-CM | POA: Insufficient documentation

## 2017-12-30 NOTE — Telephone Encounter (Signed)
I agree, I would advise to give the Dilaudid a little more time to work.  He can certainly alternate the two medications every 4 hours if he prefers. Thank you! -Nahiara Kretzschmar

## 2017-12-30 NOTE — Telephone Encounter (Signed)
Phoned patient back. Spoke with patient and wife over speaker phone. Per Freeman Caldron, PA-C I encouraged patient to alternate Vicodin and Dilaudid; taking one dilaudid tablet every four hours and vicodin every six hours if pain level was above a 5. Explained that [place] believes he needs to allow Dilaudid more time to become therapeutic. Patient and wife both verbalized understanding of all reviewed.

## 2017-12-30 NOTE — Telephone Encounter (Signed)
Received email from [facility] that patient is requesting a return call about his pain medication. Phoned patient to inquire. Patient reports picking up Dilaudid script and taking one tablet every four hours. Reports he has taken up to 4 tablets at this point without any relief. Patient questions if he can return to taking vicodin. Explained that Dilaudid is much stronger than vicodin. Patient verbalized understanding. Patient understands this RN will discuss these findings with Ashlyn Bruning, PA-C and phone back with further directions. Asked patient to continue the Dilaudid 1 tablet every four hours until I called back and not to mix the two medications. Patient verbalized understanding. Patient reports hip pain 10 on a scale of 0-10 with any movement.

## 2018-01-01 NOTE — Progress Notes (Signed)
  Radiation Oncology         [phone] ________________________________  Name: [name] MRN: [id number]  Date: 12/30/2017  DOB: 09/29/1940  SIMULATION AND TREATMENT PLANNING NOTE    ICD-10-CM   1. Bone metastasis ([place]) C79.51     DIAGNOSIS:  77 y.o. male with stage IV, (pT3bN0) high-grade urothelial carcinoma of the bladder with painful metastasis to the right ischial bone  NARRATIVE:  The patient was brought to the [facility].  Identity was confirmed.  All relevant records and images related to the planned course of therapy were reviewed.  The patient freely provided informed written consent to proceed with treatment after reviewing the details related to the planned course of therapy. The consent form was witnessed and verified by the simulation staff.  Then, the patient was set-up in a stable reproducible  supine position for radiation therapy.  CT images were obtained.  Surface markings were placed.  The CT images were loaded into the planning software.  Then the target and avoidance structures were contoured including kidneys.  Treatment planning then occurred.  The radiation prescription was entered and confirmed.  Then, I designed and supervised the construction of a total of 1 medically necessary complex treatment device with VacLoc positioner I have requested : 3D Simulation  I have requested a DVH of the following structures: Left Kidney, Right Kidney and target.  PLAN:  The patient will receive 30 Gy in 10 fractions.  ________________________________  Sheral Apley Tammi Klippel, M.D.

## 2018-01-02 ENCOUNTER — Ambulatory Visit
Admission: RE | Admit: 2018-01-02 | Discharge: 2018-01-02 | Disposition: A | Discharge status: Home / Self Care | Payer: Medicare Other | Source: Ambulatory Visit | Attending: Radiation Oncology | Admitting: Radiation Oncology

## 2018-01-02 DIAGNOSIS — C679 Malignant neoplasm of bladder, unspecified: Secondary | ICD-10-CM | POA: Diagnosis not present

## 2018-01-02 DIAGNOSIS — C7951 Secondary malignant neoplasm of bone: Secondary | ICD-10-CM | POA: Diagnosis not present

## 2018-01-02 DIAGNOSIS — Z510 Encounter for antineoplastic radiation therapy: Secondary | ICD-10-CM | POA: Diagnosis not present

## 2018-01-03 ENCOUNTER — Encounter (HOSPITAL_COMMUNITY): Payer: Self-pay

## 2018-01-03 ENCOUNTER — Ambulatory Visit (HOSPITAL_COMMUNITY)
Admission: RE | Admit: 2018-01-03 | Discharge: 2018-01-03 | Disposition: A | Discharge status: Home / Self Care | Payer: Medicare Other | Source: Ambulatory Visit | Attending: Oncology | Admitting: Oncology

## 2018-01-03 ENCOUNTER — Ambulatory Visit
Admission: RE | Admit: 2018-01-03 | Discharge: 2018-01-03 | Disposition: A | Discharge status: Home / Self Care | Payer: Medicare Other | Source: Ambulatory Visit | Attending: Radiation Oncology | Admitting: Radiation Oncology

## 2018-01-03 DIAGNOSIS — J438 Other emphysema: Secondary | ICD-10-CM | POA: Insufficient documentation

## 2018-01-03 DIAGNOSIS — C679 Malignant neoplasm of bladder, unspecified: Secondary | ICD-10-CM | POA: Diagnosis not present

## 2018-01-03 DIAGNOSIS — I2510 Atherosclerotic heart disease of native coronary artery without angina pectoris: Secondary | ICD-10-CM | POA: Diagnosis not present

## 2018-01-03 DIAGNOSIS — J432 Centrilobular emphysema: Secondary | ICD-10-CM | POA: Insufficient documentation

## 2018-01-03 DIAGNOSIS — C7951 Secondary malignant neoplasm of bone: Secondary | ICD-10-CM | POA: Diagnosis not present

## 2018-01-03 DIAGNOSIS — I700 Atherosclerosis of aorta: Secondary | ICD-10-CM | POA: Diagnosis not present

## 2018-01-03 DIAGNOSIS — R918 Other nonspecific abnormal finding of lung field: Secondary | ICD-10-CM | POA: Insufficient documentation

## 2018-01-03 DIAGNOSIS — Z510 Encounter for antineoplastic radiation therapy: Secondary | ICD-10-CM | POA: Diagnosis not present

## 2018-01-03 DIAGNOSIS — J439 Emphysema, unspecified: Secondary | ICD-10-CM | POA: Diagnosis not present

## 2018-01-03 MED ORDER — IOHEXOL 300 MG/ML  SOLN
75.0000 mL | Freq: Once | INTRAMUSCULAR | Status: AC | PRN
  Administered 2018-01-03: 75 mL via INTRAVENOUS

## 2018-01-04 ENCOUNTER — Ambulatory Visit
Admission: RE | Admit: 2018-01-04 | Discharge: 2018-01-04 | Disposition: A | Discharge status: Home / Self Care | Payer: Medicare Other | Source: Ambulatory Visit | Attending: Radiation Oncology | Admitting: Radiation Oncology

## 2018-01-04 DIAGNOSIS — Z510 Encounter for antineoplastic radiation therapy: Secondary | ICD-10-CM | POA: Diagnosis not present

## 2018-01-04 DIAGNOSIS — C679 Malignant neoplasm of bladder, unspecified: Secondary | ICD-10-CM | POA: Diagnosis not present

## 2018-01-04 DIAGNOSIS — C7951 Secondary malignant neoplasm of bone: Secondary | ICD-10-CM | POA: Diagnosis not present

## 2018-01-05 ENCOUNTER — Ambulatory Visit
Admission: RE | Admit: 2018-01-05 | Discharge: 2018-01-05 | Disposition: A | Discharge status: Home / Self Care | Payer: Medicare Other | Source: Ambulatory Visit | Attending: Radiation Oncology | Admitting: Radiation Oncology

## 2018-01-05 DIAGNOSIS — Z510 Encounter for antineoplastic radiation therapy: Secondary | ICD-10-CM | POA: Diagnosis not present

## 2018-01-05 DIAGNOSIS — C679 Malignant neoplasm of bladder, unspecified: Secondary | ICD-10-CM | POA: Diagnosis not present

## 2018-01-05 DIAGNOSIS — C7951 Secondary malignant neoplasm of bone: Secondary | ICD-10-CM | POA: Diagnosis not present

## 2018-01-06 ENCOUNTER — Ambulatory Visit
Admission: RE | Admit: 2018-01-06 | Discharge: 2018-01-06 | Disposition: A | Discharge status: Home / Self Care | Payer: Medicare Other | Source: Ambulatory Visit | Attending: Radiation Oncology | Admitting: Radiation Oncology

## 2018-01-06 DIAGNOSIS — C679 Malignant neoplasm of bladder, unspecified: Secondary | ICD-10-CM | POA: Diagnosis not present

## 2018-01-06 DIAGNOSIS — Z510 Encounter for antineoplastic radiation therapy: Secondary | ICD-10-CM | POA: Diagnosis not present

## 2018-01-06 DIAGNOSIS — C7951 Secondary malignant neoplasm of bone: Secondary | ICD-10-CM | POA: Diagnosis not present

## 2018-01-10 ENCOUNTER — Ambulatory Visit
Admission: RE | Admit: 2018-01-10 | Discharge: 2018-01-10 | Disposition: A | Discharge status: Home / Self Care | Payer: Medicare Other | Source: Ambulatory Visit | Attending: Radiation Oncology | Admitting: Radiation Oncology

## 2018-01-10 DIAGNOSIS — C7951 Secondary malignant neoplasm of bone: Secondary | ICD-10-CM | POA: Diagnosis not present

## 2018-01-10 DIAGNOSIS — Z510 Encounter for antineoplastic radiation therapy: Secondary | ICD-10-CM | POA: Diagnosis not present

## 2018-01-10 DIAGNOSIS — C679 Malignant neoplasm of bladder, unspecified: Secondary | ICD-10-CM | POA: Diagnosis not present

## 2018-01-11 ENCOUNTER — Ambulatory Visit
Admission: RE | Admit: 2018-01-11 | Discharge: 2018-01-11 | Disposition: A | Discharge status: Home / Self Care | Payer: Medicare Other | Source: Ambulatory Visit | Attending: Radiation Oncology | Admitting: Radiation Oncology

## 2018-01-11 DIAGNOSIS — C679 Malignant neoplasm of bladder, unspecified: Secondary | ICD-10-CM | POA: Diagnosis not present

## 2018-01-11 DIAGNOSIS — Z510 Encounter for antineoplastic radiation therapy: Secondary | ICD-10-CM | POA: Diagnosis not present

## 2018-01-11 DIAGNOSIS — C7951 Secondary malignant neoplasm of bone: Secondary | ICD-10-CM | POA: Diagnosis not present

## 2018-01-12 ENCOUNTER — Ambulatory Visit
Admission: RE | Admit: 2018-01-12 | Discharge: 2018-01-12 | Disposition: A | Discharge status: Home / Self Care | Payer: Medicare Other | Source: Ambulatory Visit | Attending: Radiation Oncology | Admitting: Radiation Oncology

## 2018-01-12 DIAGNOSIS — Z510 Encounter for antineoplastic radiation therapy: Secondary | ICD-10-CM | POA: Diagnosis not present

## 2018-01-12 DIAGNOSIS — C679 Malignant neoplasm of bladder, unspecified: Secondary | ICD-10-CM | POA: Diagnosis not present

## 2018-01-12 DIAGNOSIS — C7951 Secondary malignant neoplasm of bone: Secondary | ICD-10-CM | POA: Diagnosis not present

## 2018-01-13 ENCOUNTER — Ambulatory Visit
Admission: RE | Admit: 2018-01-13 | Discharge: 2018-01-13 | Disposition: A | Discharge status: Home / Self Care | Payer: Medicare Other | Source: Ambulatory Visit | Attending: Radiation Oncology | Admitting: Radiation Oncology

## 2018-01-13 DIAGNOSIS — C7951 Secondary malignant neoplasm of bone: Secondary | ICD-10-CM | POA: Diagnosis not present

## 2018-01-13 DIAGNOSIS — Z510 Encounter for antineoplastic radiation therapy: Secondary | ICD-10-CM | POA: Diagnosis not present

## 2018-01-13 DIAGNOSIS — C679 Malignant neoplasm of bladder, unspecified: Secondary | ICD-10-CM | POA: Diagnosis not present

## 2018-01-16 ENCOUNTER — Other Ambulatory Visit: Payer: Self-pay | Admitting: Radiation Oncology

## 2018-01-16 ENCOUNTER — Encounter: Payer: Self-pay | Admitting: Radiation Oncology

## 2018-01-16 ENCOUNTER — Ambulatory Visit
Admission: RE | Admit: 2018-01-16 | Discharge: 2018-01-16 | Disposition: A | Discharge status: Home / Self Care | Payer: Medicare Other | Source: Ambulatory Visit | Attending: Radiation Oncology | Admitting: Radiation Oncology

## 2018-01-16 ENCOUNTER — Other Ambulatory Visit: Payer: Self-pay | Admitting: *Deleted

## 2018-01-16 DIAGNOSIS — C679 Malignant neoplasm of bladder, unspecified: Secondary | ICD-10-CM | POA: Insufficient documentation

## 2018-01-16 DIAGNOSIS — Z510 Encounter for antineoplastic radiation therapy: Secondary | ICD-10-CM | POA: Insufficient documentation

## 2018-01-16 DIAGNOSIS — R410 Disorientation, unspecified: Secondary | ICD-10-CM

## 2018-01-16 DIAGNOSIS — C7951 Secondary malignant neoplasm of bone: Secondary | ICD-10-CM | POA: Diagnosis not present

## 2018-01-16 MED ORDER — HYDROMORPHONE HCL 4 MG PO TABS: 4.0000 mg | ORAL_TABLET | ORAL | 0 refills | Status: DC | PRN

## 2018-01-16 MED ORDER — OXYCODONE HCL 5 MG PO TABS: 5.0000 mg | ORAL_TABLET | ORAL | 0 refills | Status: DC | PRN

## 2018-01-16 NOTE — Progress Notes (Signed)
  Radiation Oncology         [phone] ________________________________  Name: [name] MRN: [id number]  Date: 01/16/2018  DOB: October 20, 1940  End of Treatment Note  Diagnosis:  77 y.o. male with stage IV, (pT3bN0) high-grade urothelial carcinoma of the bladder with painful metastasis to the right ischial bone  Indication for treatment:  Palliative       Radiation treatment dates:   01/02/18 - 01/16/18  Site/dose: Right pelvis treated to 40 Gy with 10 fx of 4 Gy  Beams/energy:   3D / 15X  Narrative: The patient tolerated radiation treatment relatively well.   The patient did endorse right pelvic pain, a persistent dry cough, excessive sweating, and confusion throughout treatment. His pain medications were changed to Dilaudid 4mg  po q 6hrs to alternate with Percocet q 4 hours which did improve his pain control.   Plan: The patient has completed radiation treatment. The patient will return to radiation oncology clinic for routine followup in one month. I advised him to call or return sooner if he has any questions or concerns related to his recovery or treatment. ________________________________  Sheral Apley. Tammi Klippel, M.D.  This document serves as a record of services personally performed by Tyler Pita, MD. It was created on his behalf by Linward Natal, a trained medical scribe. The creation of this record is based on the scribe's personal observations and the provider's statements to them. This document has been checked and approved by the attending provider.

## 2018-01-18 ENCOUNTER — Inpatient Hospital Stay: Payer: Medicare Other | Attending: Oncology | Admitting: Oncology

## 2018-01-18 ENCOUNTER — Telehealth: Payer: Self-pay | Admitting: Oncology

## 2018-01-18 ENCOUNTER — Other Ambulatory Visit (HOSPITAL_COMMUNITY)
Admission: RE | Admit: 2018-01-18 | Discharge: 2018-01-18 | Disposition: A | Discharge status: Home / Self Care | Payer: Medicare Other | Source: Ambulatory Visit | Attending: Oncology | Admitting: Oncology

## 2018-01-18 VITALS — BP 146/85 | HR 86 | Temp 97.9°F | Resp 18 | Ht 71.0 in | Wt 197.1 lb

## 2018-01-18 DIAGNOSIS — Z923 Personal history of irradiation: Secondary | ICD-10-CM | POA: Insufficient documentation

## 2018-01-18 DIAGNOSIS — R21 Rash and other nonspecific skin eruption: Secondary | ICD-10-CM | POA: Insufficient documentation

## 2018-01-18 DIAGNOSIS — G893 Neoplasm related pain (acute) (chronic): Secondary | ICD-10-CM | POA: Diagnosis not present

## 2018-01-18 DIAGNOSIS — C7800 Secondary malignant neoplasm of unspecified lung: Secondary | ICD-10-CM | POA: Insufficient documentation

## 2018-01-18 DIAGNOSIS — C679 Malignant neoplasm of bladder, unspecified: Secondary | ICD-10-CM | POA: Diagnosis not present

## 2018-01-18 DIAGNOSIS — C7951 Secondary malignant neoplasm of bone: Secondary | ICD-10-CM | POA: Diagnosis not present

## 2018-01-18 DIAGNOSIS — Z9221 Personal history of antineoplastic chemotherapy: Secondary | ICD-10-CM | POA: Diagnosis not present

## 2018-01-18 DIAGNOSIS — Z5111 Encounter for antineoplastic chemotherapy: Secondary | ICD-10-CM | POA: Diagnosis not present

## 2018-01-18 DIAGNOSIS — K5900 Constipation, unspecified: Secondary | ICD-10-CM | POA: Diagnosis not present

## 2018-01-18 DIAGNOSIS — Z7189 Other specified counseling: Secondary | ICD-10-CM | POA: Insufficient documentation

## 2018-01-18 MED ORDER — LIDOCAINE-PRILOCAINE 2.5-2.5 % EX CREA: 1.0000 "application " | TOPICAL_CREAM | CUTANEOUS | 0 refills | Status: DC | PRN

## 2018-01-18 MED ORDER — PROCHLORPERAZINE MALEATE 10 MG PO TABS: 10.0000 mg | ORAL_TABLET | Freq: Four times a day (QID) | ORAL | 0 refills | Status: DC | PRN

## 2018-01-18 NOTE — Telephone Encounter (Signed)
Scheduled appt per 6/5 los - Gave patient aVS and calender per los.

## 2018-01-18 NOTE — Progress Notes (Signed)
DISCONTINUE ON PATHWAY REGIMEN - Bladder     A cycle is every 21 days:     Gemcitabine      Cisplatin   **Always confirm dose/schedule in your pharmacy ordering system**  REASON: Other Reason PRIOR TREATMENT: [id number]: Gemcitabine 1,000 mg/m2 D1, 8 + Cisplatin 70 mg/m2 D1 q21 Days x 4 Cycles TREATMENT RESPONSE: Unable to Evaluate  START ON PATHWAY REGIMEN - Bladder     A cycle is every 21 days:     Carboplatin      Gemcitabine   **Always confirm dose/schedule in your pharmacy ordering system**  Patient Characteristics: Metastatic Disease, First Line, No Prior Neoadjuvant/Adjuvant Therapy, Poor Renal Function (CrCl < 50 mL/min), Unknown PD-L1 Expression AJCC M Category: M0 AJCC N Category: NX AJCC T Category: TX Current evidence of distant metastases<= Yes AJCC 8 Stage Grouping: IVB Line of Therapy: First Line Prior Neoadjuvant/Adjuvant Therapy<= No Renal Function: Poor Renal Function (CrCl < 50 mL/min) PD-L1 Expression Status: Unknown PD-L1 Expression Intent of Therapy: Non-Curative / Palliative Intent, Discussed with Patient

## 2018-01-18 NOTE — Progress Notes (Signed)
Hematology and Oncology Follow Up Visit  [name] [id number] 09-Mar-1941 77 y.o. 01/18/2018 1:16 PM [name], MDFusco, Purcell Nails, MD   Principle Diagnosis: 77 year-old man with high-grade urothelial carcinoma arising from the bladder.  He was initially diagnosed with stage IIIb in August 2018.  He developed metastatic disease in April 2019 with documented right pelvic mass.  PDL 1 status is pending.  Prior Therapy:   He is status post neoadjuvant chemotherapy utilizing gemcitabine and cisplatin started in September 2018.  He received day 1 of cycle 1 on 05/02/2017 and therapy discontinued after that.  He is status post laparoscopic Cystoprostatectomy and bilateral lymphadenectomy done by Dr. Tresa Moore on 06/29/2017.  A final pathology showed a T3bN0 disease.  He has 13 lymph nodes sampled without any evidence of malignancy.  He completed radiation therapy to the pelvis on January 16, 2018 under the care of Dr. Tammi Klippel.  Current therapy:   Interim History: [name] is here for a follow-up visit.  Since the last visit, he has been receiving radiation therapy that was completed on 01/16/2018.  His pain has improved slightly and did require stronger pain medication including Dilaudid and oxycodone to control his pain.  He is able to lay down but not sit on his right hip.  His pain medication have helped his complaints for the time being.  He is able to ambulate without any difficulties.  His appetite and performance status does not decline.  He denies any respiratory symptoms including cough or hemoptysis.   He does not report any headaches, blurry vision, syncope or seizures.  He denies any alteration of mental status or confusion.  He does not report any fevers, chills or sweats. He does not report any cough, wheezing or dyspnea on exertion. He does not report any chest pain, palpitation, orthopnea or leg edema. He does not report any nausea, vomiting, abdominal pain,diarrhea, hematochezia or  melena. He does not report any frequency urgency or hesitancy. He does not report any petechia or rash.  Denies any thrombosis or bleeding tendencies.  Denies any anxiety or depression.  Denies any heat or cold intolerance.  Denies any other bone pain.  Remaining review of systems is negative.  Medications: I have reviewed the patient's current medications.  Current Outpatient Medications  Medication Sig Dispense Refill  . HYDROcodone-acetaminophen (NORCO/VICODIN) 5-325 MG tablet     . HYDROmorphone (DILAUDID) 4 MG tablet Take 1 tablet (4 mg total) by mouth every 4 (four) hours as needed for severe pain. 90 tablet 0  . oxyCODONE (OXY IR/ROXICODONE) 5 MG immediate release tablet Take 1-3 tablets (5-15 mg total) by mouth every 4 (four) hours as needed for severe pain. 90 tablet 0   No current facility-administered medications for this visit.      Allergies:  Allergies  Allergen Reactions  . Ciprofloxacin Hives    Past Medical History, Surgical history, Social history, and Family History were reviewed and updated.  Physical Exam: Blood pressure (!) 146/85, pulse 86, temperature 97.9 F (36.6 C), temperature source Oral, resp. rate 18, height 5\' 11"  (1.803 m), weight 197 lb 1.6 oz (89.4 kg), SpO2 98 %.   ECOG: 1 General appearance: Comfortable appearing gentleman with no distress. Head: Normocephalic without abnormalities. Oropharynx: Mucous membranes are moist and pink. Eyes: Sclera anicteric. Lymph nodes: No lymphadenopathy noted in the cervical, supraclavicular, or axillary nodes.  Heart: Regular rate without any murmurs or gallops. Lung: clear to auscultation in all lung fields without any wheezes or dullness  to percussion. Abdomin: Soft, nontender without any rebound or guarding. Musculoskeletal: No joint deformity or effusion.  Limited range of motion his right hip. Skin: No petechia or ecchymosis. Neurological: No deficits noted on exam.   Lab Results: Lab Results   Component Value Date   WBC 13.2 (H) 12/26/2017   HGB 14.1 12/26/2017   HCT 41.5 12/26/2017   MCV 102.0 (H) 12/26/2017   PLT 350 12/26/2017     Chemistry      Component Value Date/Time   NA 141 12/26/2017 0911   NA 141 05/23/2017 0828   K 4.1 12/26/2017 0911   K 4.0 05/23/2017 0828   CL 105 12/26/2017 0911   CO2 26 12/26/2017 0911   CO2 27 05/23/2017 0828   BUN 21 12/26/2017 0911   BUN 12.5 05/23/2017 0828   CREATININE 0.85 12/26/2017 0911   CREATININE 0.8 05/23/2017 0828      Component Value Date/Time   CALCIUM 10.1 12/26/2017 0911   CALCIUM 9.0 05/23/2017 0828   ALKPHOS 71 12/26/2017 0911   ALKPHOS 57 05/23/2017 0828   AST 22 12/26/2017 0911   AST 13 05/23/2017 0828   ALT 41 12/26/2017 0911   ALT 15 05/23/2017 0828   BILITOT 0.5 12/26/2017 0911   BILITOT 0.37 05/23/2017 0828     EXAM: CT CHEST WITH CONTRAST  TECHNIQUE: Multidetector CT imaging of the chest was performed during intravenous contrast administration.  CONTRAST:  58mL OMNIPAQUE IOHEXOL 300 MG/ML  SOLN  COMPARISON:  Chest CT 01/31/2008.  FINDINGS: Cardiovascular: Heart size is normal. There is no significant pericardial fluid, thickening or pericardial calcification. There is aortic atherosclerosis, as well as atherosclerosis of the great vessels of the mediastinum and the coronary arteries, including calcified atherosclerotic plaque in the left main, left anterior descending, left circumflex and right coronary arteries. Thickening and calcification of the aortic valve.  Mediastinum/Nodes: No pathologically enlarged mediastinal or hilar lymph nodes. Small hiatal hernia. No axillary lymphadenopathy.  Lungs/Pleura: Multiple small pulmonary nodules are scattered throughout both lungs, new compared to the prior examination, highly concerning for metastatic disease. The largest of these nodules is in the left upper lobe measuring 12 x 13 mm (axial image 41 of series 5). No acute  consolidative airspace disease. No pleural effusions. Mild diffuse bronchial wall thickening with mild centrilobular and paraseptal emphysema.  Upper Abdomen: Multiple low-attenuation lesions scattered throughout the liver. The largest of these is in the central aspect of segment 4A (axial image 143 of series 2) measuring 2.9 x 1.8 cm, compatible with a simple cyst. The smaller lesions are too small to definitively characterize, but are similar to prior studies, also likely to represent cysts. Aortic atherosclerosis.  Musculoskeletal: There are no aggressive appearing lytic or blastic lesions noted in the visualized portions of the skeleton.  IMPRESSION: 1. Multiple new pulmonary nodules scattered throughout both lungs concerning for widespread metastatic disease. 2. Aortic atherosclerosis, in addition to left main and 3 vessel coronary artery disease. Assessment for potential risk factor modification, dietary therapy or pharmacologic therapy may be warranted, if clinically indicated. 3. There are calcifications of the aortic valve. Echocardiographic correlation for evaluation of potential valvular dysfunction may be warranted if clinically indicated. 4. Mild diffuse bronchial thickening with mild centrilobular and paraseptal emphysema; imaging findings suggestive of underlying COPD.  Aortic Atherosclerosis (ICD10-I70.0) and Emphysema (ICD10-J43.9).   Impression and Plan:  77 year old man with  1.  High-grade urothelial carcinoma arising from the bladder diagnosed in August 2018 with T3b disease and currently has  metastatic disease to the bone and lung.   The natural course of this disease was reviewed again with the patient and his family.  Systemic therapy is warranted given his systemic disease including bone involvement as well as diffuse pulmonary metastasis based on his CT scan obtained on Jan 03, 2018.  These results were reviewed today including imaging  studies.  Options of systemic chemotherapy was reviewed and the use of gemcitabine and carboplatin would be reasonable at this time given the high response rate anticipated with this therapy and the need to do so given his symptomatic disease.  His PDL 1 status is currently pending and immunotherapy could be an option if systemic chemotherapy is ineffective or intolerable.  Complication associated with this therapy include nausea, vomiting, myelosuppression, neutropenia, neutropenic sepsis, possible need for transfusion and rarely hospitalization, severe illness or death.  After discussion today he is agreeable to proceed in the immediate future.    2.  Right-sided hip pain: Related to metastatic disease to the right ischial tuberosity.  Pain has improved slowly with radiation and currently on adequate pain regimen.  3.  Constipation: His bowel habits have improved with MiraLAX.  I encouraged him to continue to do so.  4. Renal function: His creatinine still within normal range we will continue to monitor on carboplatin therapy.    5.  Goals of care: This was reviewed again and he understand he has an incurable malignancy and disease is incurable.  However, systemic chemotherapy should offer palliation which would be the goal of therapy at this time to improve his quality of life.  6.  IV access: Risks and benefits of Port-A-Cath insertion was reviewed today.  These complications include bleeding, thrombosis and infection.  He is agreeable to proceed and will ask interventional radiology to kindly placed a Port-A-Cath in the near future.  7.  Antiemetics: Prescription for Compazine will be available to him to use.  6. Follow-up: In the next few weeks to start chemotherapy.  25  minutes was spent with the patient face-to-face today.  More than 50% of time was dedicated to patient counseling, education and coordinating his future plan of care including scheduling systemic chemotherapy and  discussion about the complication associated with this therapy.  Zola Button, MD 6/5/20191:16 PM

## 2018-01-19 ENCOUNTER — Other Ambulatory Visit: Payer: Medicare Other

## 2018-01-22 ENCOUNTER — Ambulatory Visit
Admission: RE | Admit: 2018-01-22 | Discharge: 2018-01-22 | Disposition: A | Discharge status: Home / Self Care | Payer: Medicare Other | Source: Ambulatory Visit | Attending: Radiation Oncology | Admitting: Radiation Oncology

## 2018-01-22 DIAGNOSIS — R410 Disorientation, unspecified: Secondary | ICD-10-CM

## 2018-01-22 DIAGNOSIS — R4182 Altered mental status, unspecified: Secondary | ICD-10-CM | POA: Diagnosis not present

## 2018-01-22 MED ORDER — GADOBENATE DIMEGLUMINE 529 MG/ML IV SOLN
18.0000 mL | Freq: Once | INTRAVENOUS | Status: AC | PRN
  Administered 2018-01-22: 18 mL via INTRAVENOUS

## 2018-01-24 ENCOUNTER — Ambulatory Visit: Payer: Medicare Other | Admitting: Oncology

## 2018-01-25 ENCOUNTER — Telehealth: Payer: Self-pay | Admitting: *Deleted

## 2018-01-25 ENCOUNTER — Other Ambulatory Visit: Payer: Self-pay

## 2018-01-25 ENCOUNTER — Emergency Department (HOSPITAL_COMMUNITY): Payer: Medicare Other

## 2018-01-25 ENCOUNTER — Inpatient Hospital Stay (HOSPITAL_COMMUNITY)
Admission: EM | Admit: 2018-01-25 | Discharge: 2018-01-28 | DRG: 871 | Disposition: A | Discharge status: Home / Self Care | Payer: Medicare Other | Attending: Family Medicine | Admitting: Family Medicine

## 2018-01-25 ENCOUNTER — Other Ambulatory Visit: Payer: Medicare Other

## 2018-01-25 ENCOUNTER — Encounter (HOSPITAL_COMMUNITY): Payer: Self-pay | Admitting: *Deleted

## 2018-01-25 DIAGNOSIS — C679 Malignant neoplasm of bladder, unspecified: Secondary | ICD-10-CM | POA: Diagnosis not present

## 2018-01-25 DIAGNOSIS — N390 Urinary tract infection, site not specified: Secondary | ICD-10-CM | POA: Diagnosis present

## 2018-01-25 DIAGNOSIS — I471 Supraventricular tachycardia: Secondary | ICD-10-CM | POA: Diagnosis not present

## 2018-01-25 DIAGNOSIS — R0902 Hypoxemia: Secondary | ICD-10-CM | POA: Diagnosis not present

## 2018-01-25 DIAGNOSIS — K219 Gastro-esophageal reflux disease without esophagitis: Secondary | ICD-10-CM | POA: Diagnosis present

## 2018-01-25 DIAGNOSIS — N400 Enlarged prostate without lower urinary tract symptoms: Secondary | ICD-10-CM | POA: Diagnosis present

## 2018-01-25 DIAGNOSIS — R739 Hyperglycemia, unspecified: Secondary | ICD-10-CM | POA: Insufficient documentation

## 2018-01-25 DIAGNOSIS — Z936 Other artificial openings of urinary tract status: Secondary | ICD-10-CM | POA: Diagnosis not present

## 2018-01-25 DIAGNOSIS — R6520 Severe sepsis without septic shock: Secondary | ICD-10-CM | POA: Diagnosis present

## 2018-01-25 DIAGNOSIS — Z8551 Personal history of malignant neoplasm of bladder: Secondary | ICD-10-CM

## 2018-01-25 DIAGNOSIS — Z9221 Personal history of antineoplastic chemotherapy: Secondary | ICD-10-CM

## 2018-01-25 DIAGNOSIS — R531 Weakness: Secondary | ICD-10-CM | POA: Diagnosis not present

## 2018-01-25 DIAGNOSIS — G9341 Metabolic encephalopathy: Secondary | ICD-10-CM | POA: Diagnosis present

## 2018-01-25 DIAGNOSIS — R509 Fever, unspecified: Secondary | ICD-10-CM

## 2018-01-25 DIAGNOSIS — A419 Sepsis, unspecified organism: Secondary | ICD-10-CM | POA: Diagnosis not present

## 2018-01-25 DIAGNOSIS — C7802 Secondary malignant neoplasm of left lung: Secondary | ICD-10-CM | POA: Diagnosis present

## 2018-01-25 DIAGNOSIS — R402411 Glasgow coma scale score 13-15, in the field [EMT or ambulance]: Secondary | ICD-10-CM | POA: Diagnosis not present

## 2018-01-25 DIAGNOSIS — Z87891 Personal history of nicotine dependence: Secondary | ICD-10-CM

## 2018-01-25 DIAGNOSIS — Z923 Personal history of irradiation: Secondary | ICD-10-CM

## 2018-01-25 DIAGNOSIS — R319 Hematuria, unspecified: Secondary | ICD-10-CM

## 2018-01-25 DIAGNOSIS — R600 Localized edema: Secondary | ICD-10-CM | POA: Diagnosis not present

## 2018-01-25 DIAGNOSIS — D539 Nutritional anemia, unspecified: Secondary | ICD-10-CM | POA: Diagnosis present

## 2018-01-25 DIAGNOSIS — R410 Disorientation, unspecified: Secondary | ICD-10-CM | POA: Diagnosis not present

## 2018-01-25 DIAGNOSIS — C7951 Secondary malignant neoplasm of bone: Secondary | ICD-10-CM | POA: Diagnosis present

## 2018-01-25 DIAGNOSIS — C7801 Secondary malignant neoplasm of right lung: Secondary | ICD-10-CM | POA: Diagnosis present

## 2018-01-25 DIAGNOSIS — R402441 Other coma, without documented Glasgow coma scale score, or with partial score reported, in the field [EMT or ambulance]: Secondary | ICD-10-CM | POA: Diagnosis not present

## 2018-01-25 DIAGNOSIS — R000 Tachycardia, unspecified: Secondary | ICD-10-CM | POA: Diagnosis not present

## 2018-01-25 LAB — CBC WITH DIFFERENTIAL/PLATELET
Basophils Absolute: 0 10*3/uL (ref 0.0–0.1)
Basophils Relative: 0 %
Eosinophils Absolute: 0.1 10*3/uL (ref 0.0–0.7)
Eosinophils Relative: 0 %
HCT: 37 % — ABNORMAL LOW (ref 39.0–52.0)
Hemoglobin: 12.1 g/dL — ABNORMAL LOW (ref 13.0–17.0)
Lymphocytes Relative: 3 %
Lymphs Abs: 0.7 10*3/uL (ref 0.7–4.0)
MCH: 33.4 pg (ref 26.0–34.0)
MCHC: 32.7 g/dL (ref 30.0–36.0)
MCV: 102.2 fL — ABNORMAL HIGH (ref 78.0–100.0)
Monocytes Absolute: 1.8 10*3/uL — ABNORMAL HIGH (ref 0.1–1.0)
Monocytes Relative: 8 %
Neutro Abs: 21.3 10*3/uL — ABNORMAL HIGH (ref 1.7–7.7)
Neutrophils Relative %: 89 %
Platelets: 436 10*3/uL — ABNORMAL HIGH (ref 150–400)
RBC: 3.62 MIL/uL — ABNORMAL LOW (ref 4.22–5.81)
RDW: 12.7 % (ref 11.5–15.5)
WBC: 23.9 10*3/uL — ABNORMAL HIGH (ref 4.0–10.5)

## 2018-01-25 LAB — COMPREHENSIVE METABOLIC PANEL
ALT: 47 U/L (ref 17–63)
AST: 40 U/L (ref 15–41)
Albumin: 2.5 g/dL — ABNORMAL LOW (ref 3.5–5.0)
Alkaline Phosphatase: 69 U/L (ref 38–126)
Anion gap: 15 (ref 5–15)
BUN: 17 mg/dL (ref 6–20)
CO2: 24 mmol/L (ref 22–32)
Calcium: 9.2 mg/dL (ref 8.9–10.3)
Chloride: 97 mmol/L — ABNORMAL LOW (ref 101–111)
Creatinine, Ser: 1.13 mg/dL (ref 0.61–1.24)
GFR calc Af Amer: >60 mL/min (ref >60)
GFR calc non Af Amer: >60 mL/min (ref >60)
Glucose, Bld: 180 mg/dL — ABNORMAL HIGH (ref 65–99)
Potassium: 4.4 mmol/L (ref 3.5–5.1)
Sodium: 136 mmol/L (ref 135–145)
Total Bilirubin: 0.8 mg/dL (ref 0.3–1.2)
Total Protein: 6.5 g/dL (ref 6.5–8.1)

## 2018-01-25 LAB — URINALYSIS, ROUTINE W REFLEX MICROSCOPIC
Bilirubin Urine: NEGATIVE
Glucose, UA: NEGATIVE mg/dL
Ketones, ur: NEGATIVE mg/dL
Nitrite: NEGATIVE
Protein, ur: 100 mg/dL — AB
Specific Gravity, Urine: 1.01 (ref 1.005–1.030)
WBC, UA: >50 WBC/hpf — ABNORMAL HIGH (ref 0–5)
pH: 6 (ref 5.0–8.0)

## 2018-01-25 LAB — TROPONIN I: Troponin I: <0.03 ng/mL (ref <0.03)

## 2018-01-25 LAB — I-STAT CG4 LACTIC ACID, ED
Lactic Acid, Venous: 5.48 mmol/L (ref 0.5–1.9)
Lactic Acid, Venous: 0.73 mmol/L (ref 0.5–1.9)

## 2018-01-25 LAB — BRAIN NATRIURETIC PEPTIDE: B Natriuretic Peptide: 43 pg/mL (ref 0.0–100.0)

## 2018-01-25 LAB — LIPASE, BLOOD: Lipase: 23 U/L (ref 11–51)

## 2018-01-25 LAB — LACTIC ACID, PLASMA: Lactic Acid, Venous: 1.5 mmol/L (ref 0.5–1.9)

## 2018-01-25 LAB — GLUCOSE, CAPILLARY: Glucose-Capillary: 113 mg/dL — ABNORMAL HIGH (ref 65–99)

## 2018-01-25 MED ORDER — HYDROMORPHONE HCL 4 MG PO TABS
4.0000 mg | ORAL_TABLET | ORAL | Status: DC | PRN
  Administered 2018-01-25 – 2018-01-28 (×9): 4 mg via ORAL
  Filled 2018-01-25 (×10): qty 1

## 2018-01-25 MED ORDER — SODIUM CHLORIDE 0.9 % IV SOLN
INTRAVENOUS | Status: AC
  Administered 2018-01-25: 22:00:00 via INTRAVENOUS

## 2018-01-25 MED ORDER — ACETAMINOPHEN 650 MG RE SUPP: 650.0000 mg | Freq: Four times a day (QID) | RECTAL | Status: DC | PRN

## 2018-01-25 MED ORDER — SODIUM CHLORIDE 0.9 % IV BOLUS (SEPSIS)
1000.0000 mL | Freq: Once | INTRAVENOUS | Status: AC
  Administered 2018-01-25: 1000 mL via INTRAVENOUS

## 2018-01-25 MED ORDER — INSULIN ASPART 100 UNIT/ML ~~LOC~~ SOLN
0.0000 [IU] | Freq: Three times a day (TID) | SUBCUTANEOUS | Status: DC
  Administered 2018-01-26 – 2018-01-27 (×3): 1 [IU] via SUBCUTANEOUS

## 2018-01-25 MED ORDER — PIPERACILLIN-TAZOBACTAM 3.375 G IVPB 30 MIN
3.3750 g | Freq: Once | INTRAVENOUS | Status: AC
  Administered 2018-01-25: 3.375 g via INTRAVENOUS
  Filled 2018-01-25: qty 50

## 2018-01-25 MED ORDER — VANCOMYCIN HCL 10 G IV SOLR
1500.0000 mg | Freq: Once | INTRAVENOUS | Status: AC
  Administered 2018-01-25: 1500 mg via INTRAVENOUS
  Filled 2018-01-25 (×2): qty 1500

## 2018-01-25 MED ORDER — ALLOPURINOL 100 MG PO TABS
100.0000 mg | ORAL_TABLET | Freq: Every day | ORAL | Status: DC
  Administered 2018-01-26 – 2018-01-28 (×3): 100 mg via ORAL
  Filled 2018-01-25 (×3): qty 1

## 2018-01-25 MED ORDER — ONDANSETRON HCL 4 MG PO TABS: 4.0000 mg | ORAL_TABLET | Freq: Four times a day (QID) | ORAL | Status: DC | PRN

## 2018-01-25 MED ORDER — VANCOMYCIN HCL IN DEXTROSE 1-5 GM/200ML-% IV SOLN: 1000.0000 mg | Freq: Once | INTRAVENOUS | Status: DC

## 2018-01-25 MED ORDER — FLEET ENEMA 7-19 GM/118ML RE ENEM
1.0000 | ENEMA | Freq: Once | RECTAL | Status: DC | PRN
Start: 2018-01-25 — End: 2018-01-28

## 2018-01-25 MED ORDER — SENNOSIDES-DOCUSATE SODIUM 8.6-50 MG PO TABS: 1.0000 | ORAL_TABLET | Freq: Every evening | ORAL | Status: DC | PRN

## 2018-01-25 MED ORDER — ONDANSETRON HCL 4 MG/2ML IJ SOLN: 4.0000 mg | Freq: Four times a day (QID) | INTRAMUSCULAR | Status: DC | PRN

## 2018-01-25 MED ORDER — ACETAMINOPHEN 325 MG PO TABS
650.0000 mg | ORAL_TABLET | Freq: Four times a day (QID) | ORAL | Status: DC | PRN
  Administered 2018-01-26: 650 mg via ORAL
  Filled 2018-01-25: qty 2

## 2018-01-25 MED ORDER — INSULIN ASPART 100 UNIT/ML ~~LOC~~ SOLN: 0.0000 [IU] | Freq: Every day | SUBCUTANEOUS | Status: DC

## 2018-01-25 MED ORDER — OXYCODONE HCL 5 MG PO TABS
5.0000 mg | ORAL_TABLET | ORAL | Status: DC | PRN
  Administered 2018-01-25 – 2018-01-28 (×9): 10 mg via ORAL
  Filled 2018-01-25 (×9): qty 2

## 2018-01-25 MED ORDER — ACETAMINOPHEN 325 MG PO TABS
650.0000 mg | ORAL_TABLET | Freq: Once | ORAL | Status: AC
  Administered 2018-01-25: 650 mg via ORAL
  Filled 2018-01-25: qty 2

## 2018-01-25 MED ORDER — SODIUM CHLORIDE 0.9% FLUSH
3.0000 mL | Freq: Two times a day (BID) | INTRAVENOUS | Status: DC
  Administered 2018-01-25 – 2018-01-28 (×6): 3 mL via INTRAVENOUS

## 2018-01-25 MED ORDER — ENOXAPARIN SODIUM 40 MG/0.4ML ~~LOC~~ SOLN
40.0000 mg | SUBCUTANEOUS | Status: DC
  Administered 2018-01-25 – 2018-01-27 (×3): 40 mg via SUBCUTANEOUS
  Filled 2018-01-25 (×3): qty 0.4

## 2018-01-25 MED ORDER — BISACODYL 5 MG PO TBEC: 5.0000 mg | DELAYED_RELEASE_TABLET | Freq: Every day | ORAL | Status: DC | PRN

## 2018-01-25 MED ORDER — SODIUM CHLORIDE 0.9 % IV SOLN
2.0000 g | Freq: Two times a day (BID) | INTRAVENOUS | Status: DC
Start: 2018-01-25 — End: 2018-01-28
  Administered 2018-01-25 – 2018-01-28 (×6): 2 g via INTRAVENOUS
  Filled 2018-01-25 (×12): qty 2

## 2018-01-25 NOTE — ED Notes (Signed)
Critical Result  CG4+  Result: 5.48  MD Notified: D. Ray

## 2018-01-25 NOTE — Progress Notes (Signed)
Pharmacy Note:  Initial antibiotic(s) regimen of Vancomycin and Zosyn ordered by EDP to treat Sepsis.  Estimated Creatinine Clearance: 59.2 mL/min (by C-G formula based on SCr of 1.13 mg/dL).   Allergies  Allergen Reactions  . Ciprofloxacin Hives    Vitals:   01/25/18 1800 01/25/18 1813  BP: 127/65   Pulse: (!) 125   Resp: (!) 22   Temp:  (!) 101.4 F (38.6 C)  SpO2: 97%     Anti-infectives (From admission, onward)   Start     Dose/Rate Route Frequency Ordered Stop   01/25/18 1730  vancomycin (VANCOCIN) 1,500 mg in sodium chloride 0.9 % 500 mL IVPB     1,500 mg 250 mL/hr over 120 Minutes Intravenous  Once 01/25/18 1710     01/25/18 1700  piperacillin-tazobactam (ZOSYN) IVPB 3.375 g     3.375 g 100 mL/hr over 30 Minutes Intravenous  Once 01/25/18 1655 01/25/18 1745   01/25/18 1700  vancomycin (VANCOCIN) IVPB 1000 mg/200 mL premix  Status:  Discontinued     1,000 mg 200 mL/hr over 60 Minutes Intravenous  Once 01/25/18 1655 01/25/18 1710      Antimicrobials this admission:  Vanc 6/12 >>  Zosyn 6/12 >>   Dose adjustments this admission:  n/a   Microbiology results:  6/12 BCx: pending  UCx:    Sputum:    MRSA PCR:    Plan: Initial dose(s) of Vancomycin and Zosyn  X 1 ordered. F/U admission orders for further dosing if therapy continued.  Pricilla Larsson, [facility] 01/25/2018 6:20 PM

## 2018-01-25 NOTE — ED Notes (Signed)
Pt given a cup of water per Dr. Jeanell Sparrow

## 2018-01-25 NOTE — H&P (Signed)
History and Physical    [name] ATF:[id number] DOB: [birth year] DOA: 01/25/2018  PCP: [name], MD   Patient coming from: Home  Chief Complaint: shaking chills, gen weakness, confusion   HPI: [name] is a 77 y.o. 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  Patient is accompanied by his wife and son who assist with the history.  He has reportedly been experiencing chills for couple days, but otherwise seem to be in his usual state of health and did not have any new complaints.  Today, his wife noted him to be mildly confused, and then have an episode of shaking chills, unable to hold a glass of tea, and marked generalized weakness.  EMS was called and patient was transported to the ED.  He has had a mild chronic cough that is unchanged, has not been experiencing any vomiting or diarrhea, and has not been expressing any other specific complaints.  ED Course: Upon arrival to the ED, patient is found to be febrile to 40.3 C, saturating adequately on 2 L/min supplemental oxygen, tachycardic to 150, and with stable blood pressure.  EKG features SVT with rate 152.  Chest x-ray is negative for acute cardiopulmonary disease.  Chemistry panel is notable for glucose 180.  CBC features a leukocytosis to 23,900 and mild macrocytic anemia.  Lactic acid is elevated to 5.48, troponin undetectable, BNP normal, and urinalysis suggestive of possible infection.  Blood cultures were collected, 3 L of normal saline administered, and the patient was started on vancomycin and Zosyn.  Lactic acid normalized after the fluids, heart rate improved, blood pressure remained stable, and patient's mental status returned to baseline.  He will be admitted for ongoing evaluation and management of sepsis suspected secondary to  urinary source.  Review of Systems:  All other systems reviewed and apart from HPI, are negative.  Past Medical History:  Diagnosis Date  . Bladder cancer ([place])   . BPH (benign prostatic hypertrophy)   . Dysuria   . GERD (gastroesophageal reflux disease)   . History of adenomatous polyp of colon    tubular adenoma 06/ 2018  . History of bladder cancer 12/2014   urologist-  dr [name]--  dx High Grade TCC without stromal invasion s/p TURBT and intravesical BCG tx/  recurrent 07/2015 (pTa) TCC  repear intravesical BCG tx   . History of hiatal hernia   . History of urethral stricture   . Nocturia     Past Surgical History:  Procedure Laterality Date  . CARDIAC CATHETERIZATION  1997   normal coronary arteries  . CARDIOVASCULAR STRESS TEST  12-14-2005   Low risk perfusion study/  very small scar in the inferior septum from mid ventricle to apex,  no ischemia/  mild inferior septal hypokinesis/  ef 58%  . CATARACT EXTRACTION W/ INTRAOCULAR LENS  IMPLANT, BILATERAL  2011  . COLONOSCOPY  last one 06/ 2018  . CYSTOSCOPY WITH BIOPSY N/A 08/12/2015   Procedure: CYSTOSCOPY WITH BIOPSY AND FULGERATION;  Surgeon: [name], MD;  Location: [facility];  Service: Urology;  Laterality: N/A;  . CYSTOSCOPY WITH INJECTION N/A 06/29/2017   Procedure: CYSTOSCOPY WITH INJECTION OF INDOCYANINE GREEN DYE;  Surgeon: Alexis Frock, MD;  Location: [facility];  Service: Urology;  Laterality: N/A;  . CYSTOSCOPY WITH URETHRAL DILATATION N/A 03/21/2017   Procedure:  CYSTOSCOPY;  Surgeon: [name], MD;  Location: [facility];  Service: Urology;  Laterality: N/A;  . ESOPHAGOGASTRODUODENOSCOPY  12-05-2014  . TRANSTHORACIC ECHOCARDIOGRAM  01-31-2008   nomral LVF,  ef 55-65%/  mild pulmonic stenosis without regurg.,  peak transpulomonic valve gradient 51mmHg  . TRANSURETHRAL RESECTION OF BLADDER TUMOR N/A 12/31/2014   Procedure: TRANSURETHRAL RESECTION OF BLADDER TUMOR  (TURBT);  Surgeon: Lowella Bandy, MD;  Location: [facility];  Service: Urology;  Laterality: N/A;  . TRANSURETHRAL RESECTION OF BLADDER TUMOR N/A 03/21/2017   Procedure: POSSIBLE TRANSURETHRAL RESECTION OF BLADDER TUMOR (TURBT);  Surgeon: [name], MD;  Location: [facility];  Service: Urology;  Laterality: N/A;     reports that he quit smoking about 29 years ago. His smoking use included cigarettes. He has a 34.00 pack-year smoking history. He has never used smokeless tobacco. He reports that he drinks about 3.6 oz of alcohol per week. He reports that he does not use drugs.  Allergies  Allergen Reactions  . Ciprofloxacin Hives    Family History  Problem Relation Age of Onset  . Colon cancer Neg Hx   . Colon polyps Neg Hx   . Diabetes Neg Hx   . Kidney disease Neg Hx   . Esophageal cancer Neg Hx   . Heart disease Neg Hx   . Gallbladder disease Neg Hx      Prior to Admission medications   Medication Sig Start Date End Date Taking? Authorizing Provider  allopurinol (ZYLOPRIM) 100 MG tablet Take 100 mg by mouth daily. 01/19/18  Yes [provider]  HYDROmorphone (DILAUDID) 4 MG tablet Take 1 tablet (4 mg total) by mouth every 4 (four) hours as needed for severe pain. 01/16/18  Yes Tyler Pita, MD  oxyCODONE (OXY IR/ROXICODONE) 5 MG immediate release tablet Take 1-3 tablets (5-15 mg total) by mouth every 4 (four) hours as needed for severe pain. 01/16/18  Yes Tyler Pita, MD  lidocaine-prilocaine (EMLA) cream Apply 1 application topically as needed. 01/18/18   [name], MD  prochlorperazine (COMPAZINE) 10 MG tablet Take 1 tablet (10 mg total) by mouth every 6 (six) hours as needed for nausea or vomiting. 01/18/18   [name], MD    Physical Exam: Vitals:   01/25/18 1815 01/25/18 1830 01/25/18 1845 01/25/18 1900  BP: 140/68 122/63 127/62 126/61  Pulse: (!) 120 (!) 113 (!) 116 (!) 115  Resp: 20 19 (!) 21 14  Temp:        TempSrc:      SpO2: 99% 97% 99% 97%  Weight:      Height:          Constitutional: NAD, calm  Eyes: PERTLA, lids and conjunctivae normal ENMT: Mucous membranes are moist. Posterior pharynx clear of any exudate or lesions.   Neck: normal, supple, no masses, no thyromegaly Respiratory: clear to auscultation bilaterally, no wheezing, no crackles. Normal respiratory effort.    Cardiovascular: Rate ~110 and regular. No extremity edema.  No significant JVD. Abdomen: No distension, no tenderness, soft. Bowel sounds active.  Musculoskeletal: no clubbing / cyanosis. No joint deformity upper and lower extremities.   Skin: no significant rashes, lesions, ulcers. Poor turgor. Neurologic: CN 2-12 grossly intact. Sensation intact. Strength 5/5 in all 4 limbs.  Psychiatric: Alert and oriented to person, place, and situation. Pleasant and cooperative.     Labs on Admission: I have personally reviewed following labs and imaging studies  CBC: Recent Labs  Lab 01/25/18 1654  WBC 23.9*  NEUTROABS 21.3*  HGB 12.1*  HCT 37.0*  MCV 102.2*  PLT 696*   Basic Metabolic Panel: Recent Labs  Lab 01/25/18 1654  NA 136  K 4.4  CL 97*  CO2 24  GLUCOSE 180*  BUN 17  CREATININE 1.13  CALCIUM 9.2   GFR: Estimated Creatinine Clearance: 59.2 mL/min (by C-G formula based on SCr of 1.13 mg/dL). Liver Function Tests: Recent Labs  Lab 01/25/18 1654  AST 40  ALT 47  ALKPHOS 69  BILITOT 0.8  PROT 6.5  ALBUMIN 2.5*   Recent Labs  Lab 01/25/18 1654  LIPASE 23   No results for input(s): AMMONIA in the last 168 hours. Coagulation Profile: No results for input(s): INR, PROTIME in the last 168 hours. Cardiac Enzymes: Recent Labs  Lab 01/25/18 1654  TROPONINI <0.03   BNP (last 3 results) No results for input(s): PROBNP in the last 8760 hours. HbA1C: No results for input(s): HGBA1C in the last 72 hours. CBG: No results for input(s): GLUCAP in the last 168 hours. Lipid Profile: No  results for input(s): CHOL, HDL, LDLCALC, TRIG, CHOLHDL, LDLDIRECT in the last 72 hours. Thyroid Function Tests: No results for input(s): TSH, T4TOTAL, FREET4, T3FREE, THYROIDAB in the last 72 hours. Anemia Panel: No results for input(s): VITAMINB12, FOLATE, FERRITIN, TIBC, IRON, RETICCTPCT in the last 72 hours. Urine analysis:    Component Value Date/Time   COLORURINE YELLOW 01/25/2018 1752   APPEARANCEUR CLOUDY (A) 01/25/2018 1752   LABSPEC 1.010 01/25/2018 1752   PHURINE 6.0 01/25/2018 1752   GLUCOSEU NEGATIVE 01/25/2018 1752   HGBUR SMALL (A) 01/25/2018 1752   BILIRUBINUR NEGATIVE 01/25/2018 1752   KETONESUR NEGATIVE 01/25/2018 1752   PROTEINUR 100 (A) 01/25/2018 1752   NITRITE NEGATIVE 01/25/2018 1752   LEUKOCYTESUR SMALL (A) 01/25/2018 1752   Sepsis Labs: @LABRCNTIP (procalcitonin:4,lacticidven:4) ) Recent Results (from the past 240 hour(s))  Blood Culture (routine x 2)     Status: None (Preliminary result)   Collection Time: 01/25/18  4:54 PM  Result Value Ref Range Status   Specimen Description BLOOD RIGHT FOREARM  Final   Special Requests   Final    BOTTLES DRAWN AEROBIC AND ANAEROBIC Blood Culture adequate volume Performed at [facility], [address] [place], [place] [address]    Culture PENDING  Incomplete   Report Status PENDING  Incomplete  Blood Culture (routine x 2)     Status: None (Preliminary result)   Collection Time: 01/25/18  5:13 PM  Result Value Ref Range Status   Specimen Description BLOOD LEFT HAND  Final   Special Requests   Final    BOTTLES DRAWN AEROBIC AND ANAEROBIC Blood Culture results may not be optimal due to an excessive volume of blood received in culture bottles Performed at [facility], [address], [place], [place] [address]    Culture PENDING  Incomplete   Report Status PENDING  Incomplete     Radiological Exams on Admission: Dg Chest Port 1 View  Result Date: 01/25/2018 CLINICAL DATA:  Fever, increased weakness, history  bladder cancer, rectal cancer, former smoker EXAM: PORTABLE CHEST 1 VIEW COMPARISON:  Portable exam 1703 hours compared 04/12/2011 and correlated with a CT chest exam of 01/03/2018 FINDINGS: Normal heart size, mediastinal contours, and pulmonary vascularity. Lungs clear. No acute infiltrate, pleural effusion, or pneumothorax. Osseous structures unremarkable. IMPRESSION: No acute abnormalities. Electronically Signed   By: Lavonia Dana M.D.   On: 01/25/2018 17:16    EKG:  Independently reviewed. SVT, rate 152.   Assessment/Plan   1. Sepsis, suspected secondary to UTI  - Presents with gen weakness, confusion, and shaking chills  - Found to be febrile with 40.3 C, tachycardic, and with marked leukocytosis and elevated lactate  - Cultures collected in ED, 30 cc/kg NS given, and empiric vancomycin and Zosyn started  - CXR is clear, abdominal exam benign, no meningismus urine likely source  - Lactate normalized with IVF in ED, BP remains stable  - Continue abx with cefepime for suspected urinary source while following cultures and clinical course    2. Urothelial carcinoma with metastases  - Follows with oncology, radiation oncology, and urology  - Status-post ileal conduit, chemotherapy, radiation   - Scheduled for upcoming port placement and continued chemotherapy    3. Hyperglycemia  - Likely secondary to sepsis  - Monitor CBG and use a low-intensity SSI as needed     DVT prophylaxis: Lovenox Code Status: Full  Family Communication: Son and wife updated at bedside Consults called: None Admission status: Inpatient   Vianne Bulls, MD [facility] Pager [phone]  If 7PM-7AM, please contact night-coverage [email or web address] Password [facility]  01/25/2018, 7:50 PM

## 2018-01-25 NOTE — ED Notes (Signed)
Lactic of 5.48 given to Dr. Jeanell Sparrow.

## 2018-01-25 NOTE — ED Triage Notes (Signed)
Pt brought in by [facility] for c/o increasing weakness; pt is a cancer pt with rectal and bladder cancer; pt is unable to sit, stand or walk pt is lying on his side for comfort

## 2018-01-25 NOTE — ED Provider Notes (Signed)
[facility] EMERGENCY DEPARTMENT Provider Note   CSN: [id number] Arrival date & time: 01/25/18  1639     History   Chief Complaint Chief Complaint  Patient presents with  . Weakness    HPI PEARSE SHIFFLER is a 77 y.o. male. Level 5 caveat secondary to patient confusion and severity of disease HPI History obtained from patient's wife 71 year old man history of bladder cancer presents today with onset of confusion, weakness, and fever.  Wife states that he has been receiving radiation for cancer in low back area.  He has been going daily and has been able to move himself around and mentating normally.  Today he became confused.  Patient transported by EMS and prehospital temperature noted to be 100.7.  Wife states that oncologist saw "dot"on chest x-Zamire Whitehurst last week Past Medical History:  Diagnosis Date  . Bladder cancer ([place])   . BPH (benign prostatic hypertrophy)   . Dysuria   . GERD (gastroesophageal reflux disease)   . History of adenomatous polyp of colon    tubular adenoma 06/ 2018  . History of bladder cancer 12/2014   urologist-  dr [name]--  dx High Grade TCC without stromal invasion s/p TURBT and intravesical BCG tx/  recurrent 07/2015 (pTa) TCC  repear intravesical BCG tx   . History of hiatal hernia   . History of urethral stricture   . Nocturia     Patient Active Problem List   Diagnosis Date Noted  . Goals of care, counseling/discussion 01/18/2018  . Bone metastasis ([place]) 12/29/2017  . Status post ileal conduit ([place]) 06/29/2017  . Bladder cancer ([place]) 06/28/2017  . Malignant neoplasm of urinary bladder ([place]) 04/20/2017  . COPD UNSPECIFIED 03/07/2008    Past Surgical History:  Procedure Laterality Date  . CARDIAC CATHETERIZATION  1997   normal coronary arteries  . CARDIOVASCULAR STRESS TEST  12-14-2005   Low risk perfusion study/  very small scar in the inferior septum from mid ventricle to apex,  no ischemia/  mild inferior septal hypokinesis/  ef 58%  .  CATARACT EXTRACTION W/ INTRAOCULAR LENS  IMPLANT, BILATERAL  2011  . COLONOSCOPY  last one 06/ 2018  . CYSTOSCOPY WITH BIOPSY N/A 08/12/2015   Procedure: CYSTOSCOPY WITH BIOPSY AND FULGERATION;  Surgeon: [name], MD;  Location: [facility];  Service: Urology;  Laterality: N/A;  . CYSTOSCOPY WITH INJECTION N/A 06/29/2017   Procedure: CYSTOSCOPY WITH INJECTION OF INDOCYANINE GREEN DYE;  Surgeon: Alexis Frock, MD;  Location: [facility];  Service: Urology;  Laterality: N/A;  . CYSTOSCOPY WITH URETHRAL DILATATION N/A 03/21/2017   Procedure: CYSTOSCOPY;  Surgeon: [name], MD;  Location: [facility];  Service: Urology;  Laterality: N/A;  . ESOPHAGOGASTRODUODENOSCOPY  12-05-2014  . TRANSTHORACIC ECHOCARDIOGRAM  01-31-2008   nomral LVF,  ef 55-65%/  mild pulmonic stenosis without regurg.,  peak transpulomonic valve gradient 45mmHg  . TRANSURETHRAL RESECTION OF BLADDER TUMOR N/A 12/31/2014   Procedure: TRANSURETHRAL RESECTION OF BLADDER TUMOR (TURBT);  Surgeon: Lowella Bandy, MD;  Location: [facility];  Service: Urology;  Laterality: N/A;  . TRANSURETHRAL RESECTION OF BLADDER TUMOR N/A 03/21/2017   Procedure: POSSIBLE TRANSURETHRAL RESECTION OF BLADDER TUMOR (TURBT);  Surgeon: [name], MD;  Location: [facility];  Service: Urology;  Laterality: N/A;        Home Medications    Prior to Admission medications   Medication Sig Start Date End Date Taking? Authorizing Provider  HYDROcodone-acetaminophen (NORCO/VICODIN) 5-325  MG tablet  12/12/17   [provider]  HYDROmorphone (DILAUDID) 4 MG tablet Take 1 tablet (4 mg total) by mouth every 4 (four) hours as needed for severe pain. 01/16/18   Tyler Pita, MD  lidocaine-prilocaine (EMLA) cream Apply 1 application topically as needed. 01/18/18   [name], MD  oxyCODONE (OXY IR/ROXICODONE) 5 MG immediate release tablet Take 1-3 tablets (5-15 mg total) by  mouth every 4 (four) hours as needed for severe pain. 01/16/18   Tyler Pita, MD  prochlorperazine (COMPAZINE) 10 MG tablet Take 1 tablet (10 mg total) by mouth every 6 (six) hours as needed for nausea or vomiting. 01/18/18   [name], MD    Family History Family History  Problem Relation Age of Onset  . Colon cancer Neg Hx   . Colon polyps Neg Hx   . Diabetes Neg Hx   . Kidney disease Neg Hx   . Esophageal cancer Neg Hx   . Heart disease Neg Hx   . Gallbladder disease Neg Hx     Social History Social History   Tobacco Use  . Smoking status: Former Smoker    Packs/day: 1.00    Years: 34.00    Pack years: 34.00    Types: Cigarettes    Last attempt to quit: 12/26/1988    Years since quitting: 29.1  . Smokeless tobacco: Never Used  Substance Use Topics  . Alcohol use: Yes    Alcohol/week: 3.6 oz    Types: 6 Cans of beer per week    Comment: BEER  . Drug use: No     Allergies   Ciprofloxacin   Review of Systems Review of Systems  Unable to perform ROS: Acuity of condition     Physical Exam Updated Vital Signs Ht 1.803 m (5\' 11" )   Wt 89.4 kg (197 lb)   BMI 27.48 kg/m  Temp 104.6 There were no vitals filed for this visit. Tachycardia to 150 bp 128/70 Physical Exam  Constitutional: He appears well-developed and well-nourished.  HENT:  Head: Normocephalic and atraumatic.  Right Ear: External ear normal.  Left Ear: External ear normal.  Mm dry  Eyes: Pupils are equal, round, and reactive to light.  Neck: Normal range of motion.  Cardiovascular: Tachycardia present.  Pulmonary/Chest: Tachypnea noted. He has no wheezes. He has no rales.  Decreased breath sounds throughout  Abdominal: Soft. Bowel sounds are normal.  Urostomy bag in place  Musculoskeletal: Normal range of motion.  Neurological: He is alert. No cranial nerve deficit. Coordination normal.  Skin: Skin is warm and dry.  Nursing note and vitals reviewed.    ED Treatments / Results    Labs (all labs ordered are listed, but only abnormal results are displayed) Labs Reviewed  CULTURE, BLOOD (ROUTINE X 2)  CULTURE, BLOOD (ROUTINE X 2)  COMPREHENSIVE METABOLIC PANEL  CBC WITH DIFFERENTIAL/PLATELET  URINALYSIS, ROUTINE W REFLEX MICROSCOPIC  LIPASE, BLOOD  BRAIN NATRIURETIC PEPTIDE  TROPONIN I  I-STAT CG4 LACTIC ACID, ED    EKG EKG Interpretation  Date/Time:  Wednesday January 25 2018 16:42:25 EDT Ventricular Rate:  152 PR Interval:    QRS Duration: 83 QT Interval:  266 QTC Calculation: 423 R Axis:   -41 Text Interpretation:  Supraventricular tachycardia Left axis deviation Low voltage, precordial leads Abnormal R-wave progression, early transition Confirmed by Pattricia Boss [phone]) on 01/25/2018 5:00:40 PM   Radiology Dg Chest Port 1 View  Result Date: 01/25/2018 CLINICAL DATA:  Fever, increased weakness, history  bladder cancer, rectal cancer, former smoker EXAM: PORTABLE CHEST 1 VIEW COMPARISON:  Portable exam 1703 hours compared 04/12/2011 and correlated with a CT chest exam of 01/03/2018 FINDINGS: Normal heart size, mediastinal contours, and pulmonary vascularity. Lungs clear. No acute infiltrate, pleural effusion, or pneumothorax. Osseous structures unremarkable. IMPRESSION: No acute abnormalities. Electronically Signed   By: Lavonia Dana M.D.   On: 01/25/2018 17:16    Procedures Procedures (including critical care time)  Medications Ordered in ED Medications  sodium chloride 0.9 % bolus 1,000 mL (has no administration in time range)    And  sodium chloride 0.9 % bolus 1,000 mL (has no administration in time range)    And  sodium chloride 0.9 % bolus 1,000 mL (has no administration in time range)  piperacillin-tazobactam (ZOSYN) IVPB 3.375 g (has no administration in time range)  vancomycin (VANCOCIN) IVPB 1000 mg/200 mL premix (has no administration in time range)  acetaminophen (TYLENOL) tablet 650 mg (has no administration in time range)     Initial  Impression / Assessment and Plan / ED Course  I have reviewed the triage vital signs and the nursing notes.  Pertinent labs & imaging results that were available during my care of the patient were reviewed by me and considered in my medical decision making (see chart for details).  Clinical Course as of Jan 26 1847  Wed Jan 25, 2018  1713 Lactic Acid, Venous(!!): 5.48 [DR]  1806 WBC(!): 23.9 [DR]  1807 Leukocytosis noted-c.w.infection and sepsis   CBC WITH DIFFERENTIAL(!) [DR]    Clinical Course User Index [DR] Pattricia Boss, MD    CRITICAL CARE Performed by: Pattricia Boss Total critical care time: 60 minutes Critical care time was exclusive of separately billable procedures and treating other patients. Critical care was necessary to treat or prevent imminent or life-threatening deterioration. Critical care was time spent personally by me on the following activities: development of treatment plan with patient and/or surrogate as well as nursing, discussions with consultants, evaluation of patient's response to treatment, examination of patient, obtaining history from patient or surrogate, ordering and performing treatments and interventions, ordering and review of laboratory studies, ordering and review of radiographic studies, pulse oximetry and re-evaluation of patient's condition.  7:24 PM Repeat lactic at 0.73.  Patient's heart rate down to 100 on my exam.  BP stable.Patient awake and alert and interactive.   Vitals:   01/25/18 1845 01/25/18 1900  BP: 127/62 126/61  Pulse: (!) 116 (!) 115  Resp: (!) 21 14  Temp:    SpO2: 99% 97%   Discussed with Dr. Myna Hidalgo and he will see for admission.  Final Clinical Impressions(s) / ED Diagnoses   Final diagnoses:  Urinary tract infection with hematuria, site unspecified  Sepsis, due to unspecified organism [facility])    ED Discharge Orders    None       Pattricia Boss, MD 01/25/18 1926

## 2018-01-25 NOTE — ED Notes (Signed)
Pt on 3L [place] at 100 %

## 2018-01-25 NOTE — Telephone Encounter (Signed)
"  Sherman Donaldson calling about my Dad.  His schedule is mixed up.  Weekly chemotherapy is to start une 17 th, 24 th but his tube placement is scheduled June 25 th.  He needs the tube placed before the 17 th.  Tube ordered because he has so many chemotherapy treatments to receive.  Yes they did say this is the first available appointment."  This nurse communicated nurses may provide treatments through veins in arms, slowing rate if needed for any discomfort until tube placed for use.  Call transferred to collaborative nurse for further assistance by chance provider orders needed for scheduling accomadations.

## 2018-01-25 NOTE — Progress Notes (Signed)
Pharmacy Antibiotic Note  [name] is a 77 y.o. male admitted on 01/25/2018 with UTI.  Pharmacy has been consulted for Cefepime dosing.  Plan: Cefepime 2gm IV every 12 hours. Monitor labs, micro and vitals.   Height: 5\' 11"  (180.3 cm) Weight: 197 lb (89.4 kg) IBW/kg (Calculated) : 75.3  Temp (24hrs), Avg:103 F (39.4 C), Min:101.4 F (38.6 C), Max:104.6 F (40.3 C)  Recent Labs  Lab 01/25/18 1654 01/25/18 1702 01/25/18 1854  WBC 23.9*  --   --   CREATININE 1.13  --   --   LATICACIDVEN  --  5.48* 0.73    Estimated Creatinine Clearance: 59.2 mL/min (by C-G formula based on SCr of 1.13 mg/dL).    Allergies  Allergen Reactions  . Ciprofloxacin Hives   Antimicrobials this admission:  Vanc 6/12 >> 6/12 Zosyn 6/12 >> 6/12 Cefepime 6/12 >>  Dose adjustments this admission:  n/a   Microbiology results:  6/12 BCx: pending  UCx:    Sputum:    MRSA PCR:    Thank you for allowing pharmacy to be a part of this patient's care.  Pricilla Larsson 01/25/2018 8:00 PM

## 2018-01-25 NOTE — Telephone Encounter (Signed)
Spoke with [name] regarding port-a-cath placement. Informed him that sometimes the port can't be placed before the start of chemotherapy treatments. I communicated that the chemotherapy can still be given through veins in his arms and keep him on schedule. He verbalized understanding and will communicate this to the patient.

## 2018-01-26 LAB — CBC WITH DIFFERENTIAL/PLATELET
Basophils Absolute: 0 10*3/uL (ref 0.0–0.1)
Basophils Relative: 0 %
Eosinophils Absolute: 0 10*3/uL (ref 0.0–0.7)
Eosinophils Relative: 0 %
HCT: 29.7 % — ABNORMAL LOW (ref 39.0–52.0)
Hemoglobin: 9.5 g/dL — ABNORMAL LOW (ref 13.0–17.0)
Lymphocytes Relative: 2 %
Lymphs Abs: 0.4 10*3/uL — ABNORMAL LOW (ref 0.7–4.0)
MCH: 33 pg (ref 26.0–34.0)
MCHC: 32 g/dL (ref 30.0–36.0)
MCV: 103.1 fL — ABNORMAL HIGH (ref 78.0–100.0)
Monocytes Absolute: 1.7 10*3/uL — ABNORMAL HIGH (ref 0.1–1.0)
Monocytes Relative: 9 %
Neutro Abs: 18.3 10*3/uL — ABNORMAL HIGH (ref 1.7–7.7)
Neutrophils Relative %: 89 %
Platelets: 296 10*3/uL (ref 150–400)
RBC: 2.88 MIL/uL — ABNORMAL LOW (ref 4.22–5.81)
RDW: 12.7 % (ref 11.5–15.5)
WBC: 20.5 10*3/uL — ABNORMAL HIGH (ref 4.0–10.5)

## 2018-01-26 LAB — GLUCOSE, CAPILLARY
Glucose-Capillary: 110 mg/dL — ABNORMAL HIGH (ref 65–99)
Glucose-Capillary: 133 mg/dL — ABNORMAL HIGH (ref 65–99)
Glucose-Capillary: 124 mg/dL — ABNORMAL HIGH (ref 65–99)
Glucose-Capillary: 129 mg/dL — ABNORMAL HIGH (ref 65–99)

## 2018-01-26 LAB — BASIC METABOLIC PANEL
Anion gap: 7 (ref 5–15)
BUN: 13 mg/dL (ref 6–20)
CO2: 27 mmol/L (ref 22–32)
Calcium: 8.2 mg/dL — ABNORMAL LOW (ref 8.9–10.3)
Chloride: 101 mmol/L (ref 101–111)
Creatinine, Ser: 0.9 mg/dL (ref 0.61–1.24)
GFR calc Af Amer: >60 mL/min (ref >60)
GFR calc non Af Amer: >60 mL/min (ref >60)
Glucose, Bld: 129 mg/dL — ABNORMAL HIGH (ref 65–99)
Potassium: 3.8 mmol/L (ref 3.5–5.1)
Sodium: 135 mmol/L (ref 135–145)

## 2018-01-26 MED ORDER — ENSURE ENLIVE PO LIQD
237.0000 mL | Freq: Two times a day (BID) | ORAL | Status: DC
  Administered 2018-01-26 – 2018-01-28 (×5): 237 mL via ORAL

## 2018-01-26 NOTE — Progress Notes (Signed)
PROGRESS NOTE    [name]  ZOX:[id number] DOB: Apr 11, 1941 DOA: 01/25/2018 PCP: [name], MD    Brief Narrative:  77 year old male with a history of metastatic bladder cancer, presented to the emergency room with shaking chills and generalized weakness.  Found to have sepsis secondary to urinary tract infection.  Admitted for IV antibiotics.   Assessment & Plan:   Principal Problem:   Sepsis due to urinary tract infection ([place]) Active Problems:   Malignant neoplasm of urinary bladder ([facility])   Sepsis secondary to UTI ([place])   1. Sepsis.  Secondary to urinary tract infection.  Started on intravenous antibiotics.  Cultures are in process.  Fevers appear to be improving.  Hemodynamics are currently stable.  Continue current treatments. 2. Urinary tract infection.  Currently on cefepime.  Follow-up urine culture.  Since patient has urostomy, may have some antibiotic resistance. 3. Metastatic bladder cancer.  He has undergone radiation treatment with Dr. Tammi Klippel.  Plans are for chemotherapy to be started next week.  I have informed the cancer center at [facility] that he is currently admitted.   DVT prophylaxis: Lovenox Code Status: Full code Family Communication: Discussed with wife at the bedside Disposition Plan: Discharge home once urine culture results are available.   Consultants:     Procedures:     Antimicrobials:   Cefepime 6/12>    Subjective: Feeling better today.  No abdominal pain, no nausea or vomiting.  Fevers are improving.  Objective: Vitals:   01/25/18 2120 01/25/18 2200 01/26/18 0514 01/26/18 1515  BP: 131/81  122/75 109/73  Pulse: (!) 101  (!) 101 89  Resp:    18  Temp: 97.7 F (36.5 C)  100 F (37.8 C) 98.6 F (37 C)  TempSrc: Oral  Oral Oral  SpO2: 100% 96% 95% 95%  Weight: 90.2 kg (198 lb 13.7 oz)     Height:        Intake/Output Summary (Last 24 hours) at 01/26/2018 1817 Last data filed at 01/26/2018 1736 Gross per 24 hour    Intake 3445 ml  Output -  Net 3445 ml   Filed Weights   01/25/18 1644 01/25/18 2120  Weight: 89.4 kg (197 lb) 90.2 kg (198 lb 13.7 oz)    Examination:  General exam: Appears calm and comfortable  Respiratory system: Clear to auscultation. Respiratory effort normal. Cardiovascular system: S1 & S2 heard, RRR. No JVD, murmurs, rubs, gallops or clicks. No pedal edema. Gastrointestinal system: Abdomen is nondistended, soft and nontender. No organomegaly or masses felt. Normal bowel sounds heard.  Urostomy noted in right lower quadrant with yellow-colored urine. Central nervous system: Alert and oriented. No focal neurological deficits. Extremities: Symmetric 5 x 5 power. Skin: No rashes, lesions or ulcers Psychiatry: Judgement and insight appear normal. Mood & affect appropriate.     Data Reviewed: I have personally reviewed following labs and imaging studies  CBC: Recent Labs  Lab 01/25/18 1654 01/26/18 0425  WBC 23.9* 20.5*  NEUTROABS 21.3* 18.3*  HGB 12.1* 9.5*  HCT 37.0* 29.7*  MCV 102.2* 103.1*  PLT 436* 811   Basic Metabolic Panel: Recent Labs  Lab 01/25/18 1654 01/26/18 0425  NA 136 135  K 4.4 3.8  CL 97* 101  CO2 24 27  GLUCOSE 180* 129*  BUN 17 13  CREATININE 1.13 0.90  CALCIUM 9.2 8.2*   GFR: Estimated Creatinine Clearance: 74.4 mL/min (by C-G formula based on SCr of 0.9 mg/dL). Liver Function Tests: Recent Labs  Lab 01/25/18  1654  AST 40  ALT 47  ALKPHOS 69  BILITOT 0.8  PROT 6.5  ALBUMIN 2.5*   Recent Labs  Lab 01/25/18 1654  LIPASE 23   No results for input(s): AMMONIA in the last 168 hours. Coagulation Profile: No results for input(s): INR, PROTIME in the last 168 hours. Cardiac Enzymes: Recent Labs  Lab 01/25/18 1654  TROPONINI <0.03   BNP (last 3 results) No results for input(s): PROBNP in the last 8760 hours. HbA1C: No results for input(s): HGBA1C in the last 72 hours. CBG: Recent Labs  Lab 01/25/18 2145 01/26/18 0802  01/26/18 1138 01/26/18 1700  GLUCAP 113* 110* 133* 124*   Lipid Profile: No results for input(s): CHOL, HDL, LDLCALC, TRIG, CHOLHDL, LDLDIRECT in the last 72 hours. Thyroid Function Tests: No results for input(s): TSH, T4TOTAL, FREET4, T3FREE, THYROIDAB in the last 72 hours. Anemia Panel: No results for input(s): VITAMINB12, FOLATE, FERRITIN, TIBC, IRON, RETICCTPCT in the last 72 hours. Sepsis Labs: Recent Labs  Lab 01/25/18 1702 01/25/18 1854 01/25/18 2213  LATICACIDVEN 5.48* 0.73 1.5    Recent Results (from the past 240 hour(s))  Blood Culture (routine x 2)     Status: None (Preliminary result)   Collection Time: 01/25/18  4:54 PM  Result Value Ref Range Status   Specimen Description BLOOD RIGHT FOREARM  Final   Special Requests   Final    BOTTLES DRAWN AEROBIC AND ANAEROBIC Blood Culture adequate volume   Culture   Final    NO GROWTH < 24 HOURS Performed at [facility], [address] [place], [place] [address]    Report Status PENDING  Incomplete  Blood Culture (routine x 2)     Status: None (Preliminary result)   Collection Time: 01/25/18  5:13 PM  Result Value Ref Range Status   Specimen Description BLOOD LEFT HAND  Final   Special Requests   Final    BOTTLES DRAWN AEROBIC AND ANAEROBIC Blood Culture results may not be optimal due to an excessive volume of blood received in culture bottles   Culture   Final    NO GROWTH < 24 HOURS Performed at [facility], [address] [place], [place] [address]    Report Status PENDING  Incomplete         Radiology Studies: Dg Chest Port 1 View  Result Date: 01/25/2018 CLINICAL DATA:  Fever, increased weakness, history bladder cancer, rectal cancer, former smoker EXAM: PORTABLE CHEST 1 VIEW COMPARISON:  Portable exam 1703 hours compared 04/12/2011 and correlated with a CT chest exam of 01/03/2018 FINDINGS: Normal heart size, mediastinal contours, and pulmonary vascularity. Lungs clear. No acute infiltrate, pleural  effusion, or pneumothorax. Osseous structures unremarkable. IMPRESSION: No acute abnormalities. Electronically Signed   By: Lavonia Dana M.D.   On: 01/25/2018 17:16        Scheduled Meds: . allopurinol  100 mg Oral Daily  . enoxaparin (LOVENOX) injection  40 mg Subcutaneous Q24H  . feeding supplement (ENSURE ENLIVE)  237 mL Oral BID BM  . insulin aspart  0-5 Units Subcutaneous QHS  . insulin aspart  0-9 Units Subcutaneous TID WC  . sodium chloride flush  3 mL Intravenous Q12H   Continuous Infusions: . ceFEPime (MAXIPIME) IV Stopped (01/26/18 1139)     LOS: 1 day    Time spent: 57mins    Kathie Dike, MD [facility] Pager [phone]  If 7PM-7AM, please contact night-coverage [email or web address] Password [facility] 01/26/2018, 6:17 PM

## 2018-01-26 NOTE — Progress Notes (Addendum)
Initial Nutrition Assessment  DOCUMENTATION CODES:   Not applicable  INTERVENTION:   Monitor PO intake.  Ordered snacks BID BM  Continue Ensure Enlive po BID, each supplement provides 350 kcal and 20 grams of protein  NUTRITION DIAGNOSIS:   Increased nutrient needs related to cancer and cancer related treatments as evidenced by per patient/family report: pt will start chemo next week.  GOAL:   Patient will meet greater than or equal to 90% of their needs  MONITOR:   PO intake, Supplement acceptance, Weight trends  REASON FOR ASSESSMENT:   Malnutrition Screening Tool   ASSESSMENT:  77 y/o male with Hx of bladder cancer, dysuria, GERD, hiatal hernia, urethral stricture, and nocturia.  Admitted for general weakness, chills, confusion, and fever.  MST score of 2.  Pt triggered MST score of 2 for weight loss and poor appetite.  Spoke with pt and pt's wife and they report no weight loss and a good appetite.  They said the last wt they had for him was 193 lbs and that a UBW for pt before he got sick was between 201-204 lbs.  Pt is now 198 lbs.  They also said pt's weight had been down to the 170s when he was receiving treatment before, but pt seems to have done a good job gaining weight back from that time.  NFPE was performed because of MST score, but no concerning signs of malnutrition were found.    Pt's wife says that at home pt usually eats in small portions every 2-3 hours and that he has 2-3 Boost chocolate shakes/day (20g protein each), but does not take any vitamins at home.  Typical foods include fruit, chicken tenders, and vegetables (which wife picks up from restaurants).  Pt also has 5-6 pepsi's daily.  RD intern encouraged pt to drink more water instead.  Pt said he has not had any problems with nausea, vomiting, constipation, or diarrhea.    Currently pt has a good appetite and has no trouble eating, but because he eats only small amounts at a time, wife said he only ate  about 50% breakfast.  Snacks were ordered BID between meals to accommodate pt's normal eating patterns and Ensure Enlive is also being delivered BID between meals.  No other necessary interventions have been identified at this time.    Labs reviewed: Glucose 129 Recent Labs  Lab 01/25/18 1654 01/26/18 0425  NA 136 135  K 4.4 3.8  CL 97* 101  CO2 24 27  BUN 17 13  CREATININE 1.13 0.90  CALCIUM 9.2 8.2*  GLUCOSE 180* 129*   Medications:  Zyloprim, lovenox, ensure enlive, saline, maxipime, dilaudid, oxycodone  Past Medical History:  Diagnosis Date  . Bladder cancer ([place])   . BPH (benign prostatic hypertrophy)   . Dysuria   . GERD (gastroesophageal reflux disease)   . History of adenomatous polyp of colon    tubular adenoma 06/ 2018  . History of bladder cancer 12/2014   urologist-  dr [name]--  dx High Grade TCC without stromal invasion s/p TURBT and intravesical BCG tx/  recurrent 07/2015 (pTa) TCC  repear intravesical BCG tx   . History of hiatal hernia   . History of urethral stricture   . Nocturia    NUTRITION - FOCUSED PHYSICAL EXAM:    Most Recent Value  Orbital Region  No depletion  Upper Arm Region  Mild depletion  Temple Region  Mild depletion  Clavicle Bone Region  No depletion  Clavicle and  Acromion Bone Region  No depletion  Scapular Bone Region  No depletion  Dorsal Hand  No depletion  Anterior Thigh Region  Mild depletion  Posterior Calf Region  Mild depletion  Edema (RD Assessment)  None  Hair  Unable to assess [bald]  Skin  Reviewed [ecchymosis]  Nails  Reviewed     Diet Order:   Diet Order           Diet Heart Room service appropriate? Yes; Fluid consistency: Thin  Diet effective now         EDUCATION NEEDS:   No education needs have been identified at this time  Skin:  Skin Assessment: Skin Integrity Issues: Skin Integrity Issues:: Other (Comment) Other: Ecchymosis on both arms.  Last BM:  (not recorded)  Height:   Ht Readings from  Last 1 Encounters:  01/25/18 5\' 11"  (1.803 m)   Weight:   Wt Readings from Last 1 Encounters:  01/25/18 198 lb 13.7 oz (90.2 kg)   Ideal Body Weight:  78 kg  BMI:  Body mass index is 27.73 kg/m.   Adjusted Body Weight: 81 kg  Estimated Nutritional Needs:   Kcal:  1940-2190 kcal (24-27 kcal/kg adjusted bw)  Protein:  97-122 g (1.2-1.5 g/kg adjusted bw)  Fluid:  1940-2190 mL (61mL/kcal)

## 2018-01-27 ENCOUNTER — Inpatient Hospital Stay (HOSPITAL_COMMUNITY): Payer: Medicare Other

## 2018-01-27 ENCOUNTER — Telehealth: Payer: Self-pay | Admitting: *Deleted

## 2018-01-27 LAB — CBC
HCT: 29.8 % — ABNORMAL LOW (ref 39.0–52.0)
Hemoglobin: 9.6 g/dL — ABNORMAL LOW (ref 13.0–17.0)
MCH: 32.9 pg (ref 26.0–34.0)
MCHC: 32.2 g/dL (ref 30.0–36.0)
MCV: 102.1 fL — ABNORMAL HIGH (ref 78.0–100.0)
Platelets: 265 10*3/uL (ref 150–400)
RBC: 2.92 MIL/uL — ABNORMAL LOW (ref 4.22–5.81)
RDW: 12.8 % (ref 11.5–15.5)
WBC: 9 10*3/uL (ref 4.0–10.5)

## 2018-01-27 LAB — URINE CULTURE

## 2018-01-27 LAB — BASIC METABOLIC PANEL
Anion gap: 8 (ref 5–15)
BUN: 11 mg/dL (ref 6–20)
CO2: 29 mmol/L (ref 22–32)
Calcium: 8.3 mg/dL — ABNORMAL LOW (ref 8.9–10.3)
Chloride: 99 mmol/L — ABNORMAL LOW (ref 101–111)
Creatinine, Ser: 0.77 mg/dL (ref 0.61–1.24)
GFR calc Af Amer: >60 mL/min (ref >60)
GFR calc non Af Amer: >60 mL/min (ref >60)
Glucose, Bld: 109 mg/dL — ABNORMAL HIGH (ref 65–99)
Potassium: 3.7 mmol/L (ref 3.5–5.1)
Sodium: 136 mmol/L (ref 135–145)

## 2018-01-27 LAB — GLUCOSE, CAPILLARY
Glucose-Capillary: 105 mg/dL — ABNORMAL HIGH (ref 65–99)
Glucose-Capillary: 121 mg/dL — ABNORMAL HIGH (ref 65–99)
Glucose-Capillary: 104 mg/dL — ABNORMAL HIGH (ref 65–99)
Glucose-Capillary: 124 mg/dL — ABNORMAL HIGH (ref 65–99)

## 2018-01-27 NOTE — Progress Notes (Signed)
Pharmacy Antibiotic Note  [name] is a 77 y.o. male admitted on 01/25/2018 with UTI.  Pharmacy has been consulted for Cefepime dosing.  Plan: Continue Cefepime 2gm IV every 12 hours. Monitor labs, micro and vitals.   Height: 5\' 11"  (180.3 cm) Weight: 198 lb 13.7 oz (90.2 kg) IBW/kg (Calculated) : 75.3  Temp (24hrs), Avg:99.6 F (37.6 C), Min:98.4 F (36.9 C), Max:102.8 F (39.3 C)  Recent Labs  Lab 01/25/18 1654 01/25/18 1702 01/25/18 1854 01/25/18 2213 01/26/18 0425 01/27/18 0449  WBC 23.9*  --   --   --  20.5* 9.0  CREATININE 1.13  --   --   --  0.90 0.77  LATICACIDVEN  --  5.48* 0.73 1.5  --   --     Estimated Creatinine Clearance: 83.7 mL/min (by C-G formula based on SCr of 0.77 mg/dL).    Allergies  Allergen Reactions  . Ciprofloxacin Hives   Antimicrobials this admission:  Vanc 6/12 >> 6/12 Zosyn 6/12 >> 6/12 Cefepime 6/12 >>   Dose adjustments this admission:  n/a    Microbiology results:  6/12 BCx2: NG x 2 days 6/12 UCx: Multiple, no predominant  Sputum:    MRSA PCR:  Thank you for allowing pharmacy to be a part of this patient's care.  Pricilla Larsson 01/27/2018 12:27 PM

## 2018-01-27 NOTE — Telephone Encounter (Signed)
Returned wife's phone call regarding patient being hospitalized at [facility]. I left a message informing her that Dr. Alen Blew wants to leave the appointments for Monday, as is, so, if he is discharged over the weekend, he can come to the appointments. If he is not discharged, then I will cancel the appointments on Monday. Instructed her to call [facility] if she had any questions.

## 2018-01-27 NOTE — Progress Notes (Addendum)
PROGRESS NOTE    [name]  GQQ:[id number] DOB: 09-16-1940 DOA: 01/25/2018 PCP: [name], MD    Brief Narrative:  77 year old male with a history of metastatic bladder cancer, presented to the emergency room with shaking chills and generalized weakness.  Found to have sepsis secondary to urinary tract infection.  Admitted for IV antibiotics.   Assessment & Plan:   Principal Problem:   Sepsis due to urinary tract infection ([place]) Active Problems:   Malignant neoplasm of urinary bladder ([facility])   Sepsis secondary to UTI ([place])   1. Sepsis.  Secondary to urinary tract infection.  Currently on intravenous antibiotics.  Cultures are in process.  He did have a significant fever of 102.8 last night.  Hemodynamics are currently stable.  Continue current treatments and follow-up culture results.  Clinically, he does not appear toxic at this time 2. Urinary tract infection.  Currently on cefepime.  Follow-up urine culture.  Since patient has urostomy, may have some antibiotic resistance. 3. Metastatic bladder cancer.  He has undergone radiation treatment with Dr. Tammi Klippel.  Plans are for chemotherapy to be started next week.  I have informed the cancer center at [facility] that he is currently admitted. 4. Right leg swelling.  Check venous ultrasound to rule out DVT. 5. Acute metabolic encephalopathy secondary to sepsis/UTI.  Now resolved.  Mental status back to baseline   DVT prophylaxis: Lovenox Code Status: Full code Family Communication: Discussed with wife at the bedside Disposition Plan: Discharge home once urine culture results are available.   Consultants:     Procedures:     Antimicrobials:   Cefepime 6/12>    Subjective: Has some pain in his right leg with swelling.  No nausea or vomiting.  No chest pain.  Objective: Vitals:   01/26/18 2200 01/27/18 0017 01/27/18 0640 01/27/18 1411  BP: (!) 149/78  129/71 123/74  Pulse: (!) 110  94 88  Resp:    18    Temp: (!) 102.8 F (39.3 C) 98.4 F (36.9 C) 98.6 F (37 C) 98.4 F (36.9 C)  TempSrc: Oral Oral Oral Oral  SpO2: 96%  93% 98%  Weight:      Height:        Intake/Output Summary (Last 24 hours) at 01/27/2018 1829 Last data filed at 01/27/2018 1200 Gross per 24 hour  Intake 940 ml  Output 300 ml  Net 640 ml   Filed Weights   01/25/18 1644 01/25/18 2120  Weight: 89.4 kg (197 lb) 90.2 kg (198 lb 13.7 oz)    Examination:  General exam: Alert, awake, oriented x 3 Respiratory system: Clear to auscultation. Respiratory effort normal. Cardiovascular system:RRR. No murmurs, rubs, gallops. Gastrointestinal system: Abdomen is nondistended, soft and nontender. No organomegaly or masses felt. Normal bowel sounds heard.  Urostomy in right lower quadrant with yellow-colored urine Central nervous system: Alert and oriented. No focal neurological deficits. Extremities: Right lower extremity appears to be more edematous than left Skin: No rashes, lesions or ulcers Psychiatry: Judgement and insight appear normal. Mood & affect appropriate.   Data Reviewed: I have personally reviewed following labs and imaging studies  CBC: Recent Labs  Lab 01/25/18 1654 01/26/18 0425 01/27/18 0449  WBC 23.9* 20.5* 9.0  NEUTROABS 21.3* 18.3*  --   HGB 12.1* 9.5* 9.6*  HCT 37.0* 29.7* 29.8*  MCV 102.2* 103.1* 102.1*  PLT 436* 296 671   Basic Metabolic Panel: Recent Labs  Lab 01/25/18 1654 01/26/18 0425 01/27/18 0449  NA 136 135  136  K 4.4 3.8 3.7  CL 97* 101 99*  CO2 24 27 29   GLUCOSE 180* 129* 109*  BUN 17 13 11   CREATININE 1.13 0.90 0.77  CALCIUM 9.2 8.2* 8.3*   GFR: Estimated Creatinine Clearance: 83.7 mL/min (by C-G formula based on SCr of 0.77 mg/dL). Liver Function Tests: Recent Labs  Lab 01/25/18 1654  AST 40  ALT 47  ALKPHOS 69  BILITOT 0.8  PROT 6.5  ALBUMIN 2.5*   Recent Labs  Lab 01/25/18 1654  LIPASE 23   No results for input(s): AMMONIA in the last 168  hours. Coagulation Profile: No results for input(s): INR, PROTIME in the last 168 hours. Cardiac Enzymes: Recent Labs  Lab 01/25/18 1654  TROPONINI <0.03   BNP (last 3 results) No results for input(s): PROBNP in the last 8760 hours. HbA1C: No results for input(s): HGBA1C in the last 72 hours. CBG: Recent Labs  Lab 01/26/18 1700 01/26/18 2201 01/27/18 0735 01/27/18 1132 01/27/18 1607  GLUCAP 124* 129* 105* 121* 104*   Lipid Profile: No results for input(s): CHOL, HDL, LDLCALC, TRIG, CHOLHDL, LDLDIRECT in the last 72 hours. Thyroid Function Tests: No results for input(s): TSH, T4TOTAL, FREET4, T3FREE, THYROIDAB in the last 72 hours. Anemia Panel: No results for input(s): VITAMINB12, FOLATE, FERRITIN, TIBC, IRON, RETICCTPCT in the last 72 hours. Sepsis Labs: Recent Labs  Lab 01/25/18 1702 01/25/18 1854 01/25/18 2213  LATICACIDVEN 5.48* 0.73 1.5    Recent Results (from the past 240 hour(s))  Blood Culture (routine x 2)     Status: None (Preliminary result)   Collection Time: 01/25/18  4:54 PM  Result Value Ref Range Status   Specimen Description BLOOD RIGHT FOREARM  Final   Special Requests   Final    BOTTLES DRAWN AEROBIC AND ANAEROBIC Blood Culture adequate volume   Culture   Final    NO GROWTH 2 DAYS Performed at [facility], [address], [place], [place] [address]    Report Status PENDING  Incomplete  Blood Culture (routine x 2)     Status: None (Preliminary result)   Collection Time: 01/25/18  5:13 PM  Result Value Ref Range Status   Specimen Description BLOOD LEFT HAND  Final   Special Requests   Final    BOTTLES DRAWN AEROBIC AND ANAEROBIC Blood Culture results may not be optimal due to an excessive volume of blood received in culture bottles   Culture   Final    NO GROWTH 2 DAYS Performed at [facility], [address] [place], [place] [address]    Report Status PENDING  Incomplete  Culture, Urine     Status: Abnormal   Collection Time: 01/25/18   5:52 PM  Result Value Ref Range Status   Specimen Description   Final    URINE, RANDOM Performed at [facility], [address] [place], [place] [address]    Special Requests   Final    NONE Performed at [facility], [address] [place], [place] [address]    Culture MULTIPLE ORGANISMS PRESENT, NONE PREDOMINANT (A)  Final   Report Status 01/27/2018 FINAL  Final         Radiology Studies: US Renal  Result Date: 01/27/2018 CLINICAL DATA:  UTI, fever EXAM: RENAL / URINARY TRACT ULTRASOUND COMPLETE COMPARISON:  CT 11/25/2017 FINDINGS: Right Kidney: Length: 11.3 cm. Normal echotexture. No hydronephrosis. 2.3 cm cyst in the midpole. Left Kidney: Length: 11.3 cm. Echogenicity within normal limits. No mass or hydronephrosis visualized. Bladder:  Prior cystectomy. IMPRESSION: No hydronephrosis or acute findings. Electronically Signed   By: Rolm Baptise M.D.   On: 01/27/2018 10:56   US Venous Img Lower Bilateral  Result Date: 01/27/2018 CLINICAL DATA:  Bilateral pain and edema EXAM: BILATERAL LOWER EXTREMITY VENOUS DOPPLER ULTRASOUND TECHNIQUE: Gray-scale sonography with graded compression, as well as color Doppler and duplex ultrasound were performed to evaluate the lower extremity deep venous systems from the level of the common femoral vein and including the common femoral, femoral, profunda femoral, popliteal and calf veins including the posterior tibial, peroneal and gastrocnemius veins when visible. The superficial great saphenous vein was also interrogated. Spectral Doppler was utilized to evaluate flow at rest and with distal augmentation maneuvers in the common femoral, femoral and popliteal veins. COMPARISON:  None. FINDINGS: RIGHT LOWER EXTREMITY Common Femoral Vein: No evidence of thrombus. Normal compressibility, respiratory phasicity and response to augmentation. Saphenofemoral Junction: No evidence of thrombus. Normal compressibility and flow on color Doppler imaging. Profunda  Femoral Vein: No evidence of thrombus. Normal compressibility and flow on color Doppler imaging. Femoral Vein: No evidence of thrombus. Normal compressibility, respiratory phasicity and response to augmentation. Popliteal Vein: No evidence of thrombus. Normal compressibility, respiratory phasicity and response to augmentation. Calf Veins: No evidence of thrombus. Normal compressibility and flow on color Doppler imaging. Superficial Great Saphenous Vein: No evidence of thrombus. Normal compressibility. Venous Reflux:  None. Other Findings:  None. LEFT LOWER EXTREMITY Common Femoral Vein: No evidence of thrombus. Normal compressibility, respiratory phasicity and response to augmentation. Saphenofemoral Junction: No evidence of thrombus. Normal compressibility and flow on color Doppler imaging. Profunda Femoral Vein: No evidence of thrombus. Normal compressibility and flow on color Doppler imaging. Femoral Vein: No evidence of thrombus. Normal compressibility, respiratory phasicity and response to augmentation. Popliteal Vein: No evidence of thrombus. Normal compressibility, respiratory phasicity and response to augmentation. Calf Veins: No evidence of thrombus. Normal compressibility and flow on color Doppler imaging. Superficial Great Saphenous Vein: No evidence of thrombus. Normal compressibility. Venous Reflux:  None. Other Findings:  None. IMPRESSION: No evidence of deep venous thrombosis. Electronically Signed   By: Jerilynn Mages.  Shick M.D.   On: 01/27/2018 11:02        Scheduled Meds: . allopurinol  100 mg Oral Daily  . enoxaparin (LOVENOX) injection  40 mg Subcutaneous Q24H  . feeding supplement (ENSURE ENLIVE)  237 mL Oral BID BM  . insulin aspart  0-5 Units Subcutaneous QHS  . insulin aspart  0-9 Units Subcutaneous TID WC  . sodium chloride flush  3 mL Intravenous Q12H   Continuous Infusions: . ceFEPime (MAXIPIME) IV Stopped (01/27/18 1142)     LOS: 2 days    Time spent: 58mins    Kathie Dike, MD [facility] Pager [phone]  If 7PM-7AM, please contact night-coverage [email or web address] Password [facility] 01/27/2018, 6:29 PM

## 2018-01-27 NOTE — Care Management Important Message (Signed)
Important Message  Patient Details  Name: [name] MRN: [id number] Date of Birth: 17-Feb-1941   Medicare Important Message Given:  Yes    Sherald Barge, RN 01/27/2018, 1:57 PM

## 2018-01-28 DIAGNOSIS — A419 Sepsis, unspecified organism: Principal | ICD-10-CM

## 2018-01-28 DIAGNOSIS — C679 Malignant neoplasm of bladder, unspecified: Secondary | ICD-10-CM

## 2018-01-28 DIAGNOSIS — N390 Urinary tract infection, site not specified: Secondary | ICD-10-CM

## 2018-01-28 LAB — CBC
HCT: 30.5 % — ABNORMAL LOW (ref 39.0–52.0)
Hemoglobin: 10 g/dL — ABNORMAL LOW (ref 13.0–17.0)
MCH: 33.3 pg (ref 26.0–34.0)
MCHC: 32.8 g/dL (ref 30.0–36.0)
MCV: 101.7 fL — ABNORMAL HIGH (ref 78.0–100.0)
Platelets: 281 10*3/uL (ref 150–400)
RBC: 3 MIL/uL — ABNORMAL LOW (ref 4.22–5.81)
RDW: 12.8 % (ref 11.5–15.5)
WBC: 6.4 10*3/uL (ref 4.0–10.5)

## 2018-01-28 LAB — COMPREHENSIVE METABOLIC PANEL
ALT: 42 U/L (ref 17–63)
AST: 50 U/L — ABNORMAL HIGH (ref 15–41)
Albumin: 2.1 g/dL — ABNORMAL LOW (ref 3.5–5.0)
Alkaline Phosphatase: 48 U/L (ref 38–126)
BUN: 10 mg/dL (ref 6–20)
Calcium: 8.4 mg/dL — ABNORMAL LOW (ref 8.9–10.3)
Chloride: 98 mmol/L — ABNORMAL LOW (ref 101–111)
Creatinine, Ser: 0.73 mg/dL (ref 0.61–1.24)
GFR calc Af Amer: >60 mL/min (ref >60)
GFR calc non Af Amer: >60 mL/min (ref >60)
Glucose, Bld: 96 mg/dL (ref 65–99)
Potassium: 3.7 mmol/L (ref 3.5–5.1)
Sodium: 135 mmol/L (ref 135–145)
Total Bilirubin: 0.1 mg/dL — ABNORMAL LOW (ref 0.3–1.2)
Total Protein: 5.2 g/dL — ABNORMAL LOW (ref 6.5–8.1)

## 2018-01-28 LAB — GLUCOSE, CAPILLARY: Glucose-Capillary: 108 mg/dL — ABNORMAL HIGH (ref 65–99)

## 2018-01-28 MED ORDER — CEPHALEXIN 500 MG PO CAPS: 500.0000 mg | ORAL_CAPSULE | Freq: Three times a day (TID) | ORAL | 0 refills | Status: AC

## 2018-01-28 NOTE — Progress Notes (Signed)
IVs removed and discharge instructions reviewed with patient and wife.  Script for abx sent to pharmacy and to keep appointment to start chemo on Monday at [facility]. Son to drive home

## 2018-01-28 NOTE — Discharge Instructions (Signed)
Please inform your oncology doctor that you are being treated for a Urinary tract infection.   Seek medical care or return to ER if symptoms return, worsen or new problem develops.     Follow with Primary MD  [name], MD  and other consultants as instructed your Hospitalist MD  Please get a complete blood count and chemistry panel checked by your Primary MD at your next visit, and again as instructed by your Primary MD.  Get Medicines reviewed and adjusted: Please take all your medications with you for your next visit with your Primary MD  Laboratory/radiological data: Please request your Primary MD to go over all hospital tests and procedure/radiological results at the follow up, please ask your Primary MD to get all Hospital records sent to his/her office.  In some cases, they will be blood work, cultures and biopsy results pending at the time of your discharge. Please request that your primary care M.D. follows up on these results.  Also Note the following: If you experience worsening of your admission symptoms, develop shortness of breath, life threatening emergency, suicidal or homicidal thoughts you must seek medical attention immediately by calling 911 or calling your MD immediately  if symptoms less severe.  You must read complete instructions/literature along with all the possible adverse reactions/side effects for all the Medicines you take and that have been prescribed to you. Take any new Medicines after you have completely understood and accpet all the possible adverse reactions/side effects.   Do not drive when taking Pain medications or sleeping medications (Benzodaizepines)  Do not take more than prescribed Pain, Sleep and Anxiety Medications. It is not advisable to combine anxiety,sleep and pain medications without talking with your primary care practitioner  Special Instructions: If you have smoked or chewed Tobacco  in the last 2 yrs please stop smoking, stop any  regular Alcohol  and or any Recreational drug use.  Wear Seat belts while driving.  Please note: You were cared for by a hospitalist during your hospital stay. Once you are discharged, your primary care physician will handle any further medical issues. Please note that NO REFILLS for any discharge medications will be authorized once you are discharged, as it is imperative that you return to your primary care physician (or establish a relationship with a primary care physician if you do not have one) for your post hospital discharge needs so that they can reassess your need for medications and monitor your lab values.      Antibiotic Medicine, Adult Antibiotic medicines are used to treat infections caused by bacteria, such as strep throat and urinary tract infection (UTI). Antibiotic medicines will not work for viral illnesses, such as colds or the flu (influenza). They work by killing the bacteria that is making you sick. Antibiotics can also have serious side effects. It is important that you take antibiotic medicines safely and only when needed. When do I need to take antibiotics? Antibiotics are medicines that treat bacterial infections. You may need antibiotics for:  UTI.  Strep throat.  Meningitis. This infection affects the spinal cord and brain.  Bacterial sinusitis.  Serious lung infection.  You may start antibiotics while your health care provider waits for test results to come back. Common tests may include throat, urine, blood, or mucus culture. Your health care provider may change or stop the antibiotic depending on your test results. When are antibiotics not needed? You do not need antibiotics for most common illnesses. These illnesses may be caused  by a virus, not a bacteria. You do not need antibiotics for:  The common cold.  Influenza.  Sore throat.  Discolored mucus.  Bronchitis.  Antibiotics are not always needed for all bacterial infections. Many of these  infections clear up without antibiotic treatment. Do not ask for or take antibiotics when they are not necessary. How long should I take the antibiotic? You must take the entire prescription. Continue to take your antibiotic for as long as told by your health care provider. Do not stop taking it even if you start to feel better. If you stop taking it too soon:  You may start to feel sick again.  Your infection may become harder to treat.  Complications may develop.  Each course of antibiotics needs a different amount of time to work. Some antibiotic courses last only a few days. Some last about a week to 10 days. In some cases, you may need to take antibiotics for a few weeks to completely treat the infection. What if I miss a dose? Try not to miss any doses of medicine. If you miss a dose, call your health care provider or pharmacist for advice. Sometimes it is okay to take the missed dose as soon as possible. What are the risks of taking antibiotics? Most antibiotics can cause an infection called Clostridium difficile (C. difficile), which causes severe diarrhea. This infection happens when the antibiotics kill the healthy bacteria in your intestines. This allows C. difficile to grow. The infection needs to be treated right away. Let your health care provider know if:  You have diarrhea while taking an antibiotic.  You have diarrhea after you stop taking an antibiotic. C. difficile infection can start weeks after stopping the antibiotic.  Taking an antibiotic also puts you at risk for getting a bacteria that does not respond to medicine (antibiotic-resistant infection) in the future. Antibiotics can cause bacteria to change so that if the antibiotic is taken again, the medicine is not able to kill the bacteria. These infections can be more serious and, in some cases, life-threatening. Do antibiotics affect birth control? Birth control pills may not work while you are on antibiotics. If you  are taking birth control pills, continue taking them as usual and use a second form of birth control, such as a condom, to avoid unwanted pregnancy. Continue using the second form of birth control until your health care provider says you can stop. What else should I know about taking antibiotics? It is important for you to take antibiotics exactly as told. Make sure that you:  Take the entire course of antibiotic that was prescribed. Do not stop taking your antibiotics even if your symptoms improve.  Take the correct amount of medicine each day.  Ask your health care provider: ? How long to wait in between doses. ? If the antibiotic should be taken with food. ? If there are any foods, drinks, or medicines that you should avoid while taking the antibiotics. ? If there are any side effects you should be aware of.  Only use the antibiotics prescribed for you by your health care provider. Do not use antibiotics prescribed for someone else.  Drink a large glass of water along with the antibiotics.  Ask the pharmacist for a syringe, cup, or spoon that properly measures the antibiotics.  Throw away any leftover medicine.  Contact a health care provider if:  Your symptoms get worse.  You have new joint pain or muscle aches that begin after starting  the antibiotic. When should I seek immediate medical care?  You have signs of a serious allergic reaction to antibiotics. If you have signs of a severe allergic reaction, stop taking the antibiotic right away. Signs may include: ? Hives, which are raised, itchy, red bumps on the skin. ? Skin rash. ? Trouble breathing. ? A wheezing sound when you breathe. ? Swelling anywhere on your body. ? Feeling dizzy. ? Vomiting.  Your urine turns dark or becomes blood-colored.  Your skin turns yellow.  You bruise or bleed easily.  You have severe diarrhea and abdominal cramps.  You have a severe headache. Summary  Antibiotic medicines are used  to treat infections caused by bacteria, such as strep throat and UTIs. It is important that you take antibiotic medicines only when needed.  Your health care provider may change or stop the antibiotic depending on your test results.  Most antibiotics can cause an infection called Clostridium difficile (C. difficile), which causes severe diarrhea. Let your health care provider know if you develop diarrhea while taking an antibiotic.  Take the entire course of antibiotic that was prescribed. This information is not intended to replace advice given to you by your health care provider. Make sure you discuss any questions you have with your health care provider. Document Released: 04/14/2004 Document Revised: 08/03/2016 Document Reviewed: 08/03/2016 Elsevier Interactive Patient Education  2018 [employer].  Urinary Tract Infection, Adult A urinary tract infection (UTI) is an infection of any part of the urinary tract. The urinary tract includes the:  Kidneys.  Ureters.  Bladder.  Urethra.  These organs make, store, and get rid of pee (urine) in the body. Follow these instructions at home:  Take over-the-counter and prescription medicines only as told by your doctor.  If you were prescribed an antibiotic medicine, take it as told by your doctor. Do not stop taking the antibiotic even if you start to feel better.  Avoid the following drinks: ? Alcohol. ? Caffeine. ? Tea. ? Carbonated drinks.  Drink enough fluid to keep your pee clear or pale yellow.  Keep all follow-up visits as told by your doctor. This is important.  Make sure to: ? Empty your bladder often and completely. Do not to hold pee for long periods of time. ? Empty your bladder before and after sex. ? Wipe from front to back after a bowel movement if you are male. Use each tissue one time when you wipe. Contact a doctor if:  You have back pain.  You have a fever.  You feel sick to your stomach  (nauseous).  You throw up (vomit).  Your symptoms do not get better after 3 days.  Your symptoms go away and then come back. Get help right away if:  You have very bad back pain.  You have very bad lower belly (abdominal) pain.  You are throwing up and cannot keep down any medicines or water. This information is not intended to replace advice given to you by your health care provider. Make sure you discuss any questions you have with your health care provider. Document Released: 01/19/2008 Document Revised: 01/08/2016 Document Reviewed: 06/23/2015 Elsevier Interactive Patient Education  2018 [place] is a surgical procedure that is done to create an opening (stoma) for urine to leave the body. This procedure may be done if a medical condition prevents the usual flow of urine into your bladder and out of your body. It may also be done if you have  had your bladder removed. During the surgery, the tubes that drain urine from your kidneys (ureters) are connected to a piece of intestine. This piece of intestine is attached to the stoma made in the front of your abdomen. A bag or pouch is fitted over the stoma to catch your urine. Tell a health care provider about:  Any allergies you have.  All medicines you are taking, including vitamins, herbs, eye drops, creams, and over-the-counter medicines.  Any problems you or family members have had with anesthetic medicines.  Any blood disorders you have.  Any surgeries you have had.  Any medical conditions you have.  Whether you are pregnant or may be pregnant. What are the risks? Generally, this is a safe procedure. However, problems may occur, including:  Infection.  Bleeding.  Allergic reactions to medicines.  Damage to other structures or organs.  Blood clot.  What happens before the procedure?  Follow instructions from your health care provider about eating or drinking restrictions.  Ask your  health care provider about: ? Changing or stopping your regular medicines. This is especially important if you are taking diabetes medicines or blood thinners. ? Taking medicines such as aspirin and ibuprofen. These medicines can thin your blood. Do not take these medicines before your procedure if your health care provider instructs you not to.  Do not use tobacco products, such as cigarettes, chewing tobacco, and e-cigarettes. If you need help quitting, ask your health care provider.  Plan to have someone take you home after the procedure.  You may have an exam or testing.  You may be prescribed an oral bowel prep. This involves drinking a large amount of medicated liquid, starting the day before your surgery. The liquid will cause you to have multiple loose stools until your stool is almost clear or light green. This cleans out your colon in preparation for the surgery.  Ask your health care provider how your surgical site will be marked or identified.  You may be given antibiotic medicine to help prevent infection. What happens during the procedure?  To reduce your risk of infection: ? Your health care team will wash or sanitize their hands. ? Your skin will be washed with soap.  An IV tube will be inserted into one of your veins.  You will be given one or more of the following: ? A medicine to help you relax (sedative). ? A medicine to make you fall asleep (general anesthetic).  A tube (nasogastric tube) may be put through your nose into your stomach. This will remove stomach fluids and help prevent nausea and vomiting.  An incision will be made in your abdomen.  A small piece of intestine will be removed, and the remaining ends of intestine will be stitched (sutured) back together. The ureters will be attached to the small piece of intestine (ileal conduit).  The ileal conduit will be brought up and attached to the abdominal wall, creating the stoma that urine can pass  through.  Small tubes (stents) may be placed in the ureters to make sure the flow of urine is not blocked if the ureters swell after surgery.  The muscles of the abdomen will be sutured back together, and then the skin will be sutured or stapled. A bandage (dressing) may be used to cover the incision or incisions. A urostomy pouch will be attached to the stoma. The procedure may vary among health care providers and hospitals. What happens after the procedure?  Your blood pressure, heart  rate, breathing rate, and blood oxygen level will be monitored often until the medicines you were given have worn off.  You will be given pain medicine as needed.  You may continue to have the nasogastric tube in place until your intestines are working normally again.  You will be encouraged to breathe deeply and to cough. This helps your lungs recover from surgery.  At first, you may only be allowed to suck on ice chips. You will slowly advance to drinking clear fluids and then return to a normal diet.  You will be taught how to care for the stoma and urostomy pouch. This information is not intended to replace advice given to you by your health care provider. Make sure you discuss any questions you have with your health care provider. Document Released: 08/29/2015 Document Revised: 01/08/2016 Document Reviewed: 04/15/2015 Elsevier Interactive Patient Education  2018 [employer].

## 2018-01-28 NOTE — Discharge Summary (Signed)
Physician Discharge Summary  [name]:[id number] DOB: 1941/01/16 DOA: 01/25/2018  PCP: [name], MD Oncologist: Dahlia Byes   Admit date: 01/25/2018 Discharge date: 01/28/2018  Admitted From: Home  Disposition: Home   Recommendations for Outpatient Follow-up:  1. Follow up with PCP in 2 weeks. 2. Follow up with oncologist in 1 week.   Discharge Condition: STABLE   CODE STATUS: FULL    Brief Hospitalization Summary: Please see all hospital notes, images, labs for full details of the hospitalization.  HPI: [name] is a 77 y.o. 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  Patient is accompanied by his wife and son who assist with the history.  He has reportedly been experiencing chills for couple days, but otherwise seem to be in his usual state of health and did not have any new complaints.  Today, his wife noted him to be mildly confused, and then have an episode of shaking chills, unable to hold a glass of tea, and marked generalized weakness.  EMS was called and patient was transported to the ED.  He has had a mild chronic cough that is unchanged, has not been experiencing any vomiting or diarrhea, and has not been expressing any other specific complaints.  ED Course: Upon arrival to the ED, patient is found to be febrile to 40.3 C, saturating adequately on 2 L/min supplemental oxygen, tachycardic to 150, and with stable blood pressure.  EKG features SVT with rate 152.  Chest x-ray is negative for acute cardiopulmonary disease.  Chemistry panel is notable for glucose 180.  CBC features a leukocytosis to 23,900 and mild macrocytic anemia.  Lactic acid is elevated to 5.48, troponin undetectable, BNP normal, and urinalysis suggestive of possible infection.  Blood cultures were  collected, 3 L of normal saline administered, and the patient was started on vancomycin and Zosyn.  Lactic acid normalized after the fluids, heart rate improved, blood pressure remained stable, and patient's mental status returned to baseline.  He will be admitted for ongoing evaluation and management of sepsis suspected secondary to urinary source.  Brief Narrative:  77 year old male with a history of metastatic bladder cancer, presented to the emergency room with shaking chills and generalized weakness.  Found to have sepsis secondary to urinary tract infection.  Admitted for IV antibiotics.  Assessment & Plan:     Sepsis due to urinary tract infection   Malignant neoplasm of urinary bladder   Sepsis secondary to UTI   1. Sepsis.  Secondary to urinary tract infection.  Currently on intravenous antibiotics.  Cultures are in process.  He is afebrile.  Hemodynamics are currently stable.  Continue current treatments and follow-up culture results.  Clinically, he does not appear toxic at this time.  He feels much better and asking to discharge home.  I asked for him to speak with his oncologist before proceeding with systemic chemotherapy.   2. Urinary tract infection.  Treated with IV cefepime.  Urine culture did not grow any significant findings.   He will be discharged on 3 more days of oral cephalexin.  He was treated with 3 days of IV cefepime and is doing much better, he is afebrile.   3. Metastatic bladder cancer.  He has undergone radiation treatment with Dr. Tammi Klippel.  Plans are for chemotherapy to be started next week. I asked the patient  to please see his oncologist before starting chemotherapy.  He verbalized understanding.   4. Right leg swelling.  Check venous ultrasound to rule out DVT. 5. Acute metabolic encephalopathy secondary to sepsis/UTI.  Now resolved.  Mental status back to baseline  DVT prophylaxis: Lovenox Code Status: Full code Family Communication: Discussed with wife  at the bedside Disposition Plan: Discharge home   Discharge Diagnoses:  Principal Problem:   Sepsis due to urinary tract infection ([place]) Active Problems:   Malignant neoplasm of urinary bladder ([facility])   Sepsis secondary to UTI [facility])  Discharge Instructions: Discharge Instructions    Call MD for:  extreme fatigue   Complete by:  As directed    Call MD for:  hives   Complete by:  As directed    Call MD for:  persistant dizziness or light-headedness   Complete by:  As directed    Call MD for:  persistant nausea and vomiting   Complete by:  As directed    Call MD for:  temperature >100.4   Complete by:  As directed    Increase activity slowly   Complete by:  As directed      Allergies as of 01/28/2018      Reactions   Ciprofloxacin Hives      Medication List    STOP taking these medications   HYDROmorphone 4 MG tablet Commonly known as:  DILAUDID     TAKE these medications   allopurinol 100 MG tablet Commonly known as:  ZYLOPRIM Take 100 mg by mouth daily.   cephALEXin 500 MG capsule Commonly known as:  KEFLEX Take 1 capsule (500 mg total) by mouth 3 (three) times daily for 3 days.   lidocaine-prilocaine cream Commonly known as:  EMLA Apply 1 application topically as needed.   oxyCODONE 5 MG immediate release tablet Commonly known as:  Oxy IR/ROXICODONE Take 1-3 tablets (5-15 mg total) by mouth every 4 (four) hours as needed for severe pain.   prochlorperazine 10 MG tablet Commonly known as:  COMPAZINE Take 1 tablet (10 mg total) by mouth every 6 (six) hours as needed for nausea or vomiting.      Follow-up Information    [name], MD. Schedule an appointment as soon as possible for a visit in 2 week(s).   Specialty:  Internal Medicine Contact information: [address] [place] [address] [phone]        [name], MD. Schedule an appointment as soon as possible for a visit in 1 week(s).   Specialty:  Oncology Contact  information: [address] [place] [address] [phone]          Allergies  Allergen Reactions  . Ciprofloxacin Hives   Allergies as of 01/28/2018      Reactions   Ciprofloxacin Hives      Medication List    STOP taking these medications   HYDROmorphone 4 MG tablet Commonly known as:  DILAUDID     TAKE these medications   allopurinol 100 MG tablet Commonly known as:  ZYLOPRIM Take 100 mg by mouth daily.   cephALEXin 500 MG capsule Commonly known as:  KEFLEX Take 1 capsule (500 mg total) by mouth 3 (three) times daily for 3 days.   lidocaine-prilocaine cream Commonly known as:  EMLA Apply 1 application topically as needed.   oxyCODONE 5 MG immediate release tablet Commonly known as:  Oxy IR/ROXICODONE Take 1-3 tablets (5-15 mg total) by mouth every 4 (four) hours as needed for severe  pain.   prochlorperazine 10 MG tablet Commonly known as:  COMPAZINE Take 1 tablet (10 mg total) by mouth every 6 (six) hours as needed for nausea or vomiting.      Procedures/Studies: Ct Chest W Contrast  Result Date: 01/03/2018 CLINICAL DATA:  77 year old male with history of bladder cancer diagnosed 3 years ago status post urostomy in November 2018 as well as chemotherapy and radiation therapy. Radiation therapy is ongoing. Former smoker (quit in the 1980s). EXAM: CT CHEST WITH CONTRAST TECHNIQUE: Multidetector CT imaging of the chest was performed during intravenous contrast administration. CONTRAST:  85mL OMNIPAQUE IOHEXOL 300 MG/ML  SOLN COMPARISON:  Chest CT 01/31/2008. FINDINGS: Cardiovascular: Heart size is normal. There is no significant pericardial fluid, thickening or pericardial calcification. There is aortic atherosclerosis, as well as atherosclerosis of the great vessels of the mediastinum and the coronary arteries, including calcified atherosclerotic plaque in the left main, left anterior descending, left circumflex and right coronary arteries. Thickening  and calcification of the aortic valve. Mediastinum/Nodes: No pathologically enlarged mediastinal or hilar lymph nodes. Small hiatal hernia. No axillary lymphadenopathy. Lungs/Pleura: Multiple small pulmonary nodules are scattered throughout both lungs, new compared to the prior examination, highly concerning for metastatic disease. The largest of these nodules is in the left upper lobe measuring 12 x 13 mm (axial image 41 of series 5). No acute consolidative airspace disease. No pleural effusions. Mild diffuse bronchial wall thickening with mild centrilobular and paraseptal emphysema. Upper Abdomen: Multiple low-attenuation lesions scattered throughout the liver. The largest of these is in the central aspect of segment 4A (axial image 143 of series 2) measuring 2.9 x 1.8 cm, compatible with a simple cyst. The smaller lesions are too small to definitively characterize, but are similar to prior studies, also likely to represent cysts. Aortic atherosclerosis. Musculoskeletal: There are no aggressive appearing lytic or blastic lesions noted in the visualized portions of the skeleton. IMPRESSION: 1. Multiple new pulmonary nodules scattered throughout both lungs concerning for widespread metastatic disease. 2. Aortic atherosclerosis, in addition to left main and 3 vessel coronary artery disease. Assessment for potential risk factor modification, dietary therapy or pharmacologic therapy may be warranted, if clinically indicated. 3. There are calcifications of the aortic valve. Echocardiographic correlation for evaluation of potential valvular dysfunction may be warranted if clinically indicated. 4. Mild diffuse bronchial thickening with mild centrilobular and paraseptal emphysema; imaging findings suggestive of underlying COPD. Aortic Atherosclerosis (ICD10-I70.0) and Emphysema (ICD10-J43.9). Electronically Signed   By: Vinnie Langton M.D.   On: 01/03/2018 16:07   Mr Jeri Cos IR Contrast  Result Date:  01/22/2018 CLINICAL DATA:  Invasive bladder cancer. Altered mental status. Disorientation. EXAM: MRI HEAD WITHOUT AND WITH CONTRAST TECHNIQUE: Multiplanar, multiecho pulse sequences of the brain and surrounding structures were obtained without and with intravenous contrast. CONTRAST:  59mL MULTIHANCE GADOBENATE DIMEGLUMINE 529 MG/ML IV SOLN COMPARISON:  None. FINDINGS: Brain: Moderate periventricular white matter changes are present bilaterally. Dilated perivascular spaces are present throughout the basal ganglia. No acute infarct is present. The postcontrast images demonstrate no pathologic enhancement to suggest metastatic disease to the brain. The ventricles are of normal size. No significant extra-axial fluid collection is present. The internal auditory canals are within normal limits bilaterally. The brainstem is normal. A remote lacunar infarct is present in the inferior left cerebellum. Vascular: Flow is present in the major intracranial arteries. Skull and upper cervical spine: The skull base is within normal limits. The craniocervical junction is normal. The upper cervical spine is within normal  limits. Sinuses/Orbits: Fluid levels are present in the sphenoid sinuses bilaterally. The paranasal sinuses and mastoid air cells are otherwise clear. Bilateral lens replacements are present. Globes and orbits are otherwise within normal limits. IMPRESSION: 1. No pathologic enhancement to suggest metastatic disease to the brain. 2. Periventricular white matter changes are moderately advanced for age. This likely reflects the sequela of chronic microvascular ischemia. 3. Dilated perivascular spaces throughout the basal ganglia consistent with moderate atrophy. 4. No acute intracranial abnormality. Electronically Signed   By: San Morelle M.D.   On: 01/22/2018 20:08   US Renal  Result Date: 01/27/2018 CLINICAL DATA:  UTI, fever EXAM: RENAL / URINARY TRACT ULTRASOUND COMPLETE COMPARISON:  CT 11/25/2017  FINDINGS: Right Kidney: Length: 11.3 cm. Normal echotexture. No hydronephrosis. 2.3 cm cyst in the midpole. Left Kidney: Length: 11.3 cm. Echogenicity within normal limits. No mass or hydronephrosis visualized. Bladder: Prior cystectomy. IMPRESSION: No hydronephrosis or acute findings. Electronically Signed   By: Rolm Baptise M.D.   On: 01/27/2018 10:56   US Venous Img Lower Bilateral  Result Date: 01/27/2018 CLINICAL DATA:  Bilateral pain and edema EXAM: BILATERAL LOWER EXTREMITY VENOUS DOPPLER ULTRASOUND TECHNIQUE: Gray-scale sonography with graded compression, as well as color Doppler and duplex ultrasound were performed to evaluate the lower extremity deep venous systems from the level of the common femoral vein and including the common femoral, femoral, profunda femoral, popliteal and calf veins including the posterior tibial, peroneal and gastrocnemius veins when visible. The superficial great saphenous vein was also interrogated. Spectral Doppler was utilized to evaluate flow at rest and with distal augmentation maneuvers in the common femoral, femoral and popliteal veins. COMPARISON:  None. FINDINGS: RIGHT LOWER EXTREMITY Common Femoral Vein: No evidence of thrombus. Normal compressibility, respiratory phasicity and response to augmentation. Saphenofemoral Junction: No evidence of thrombus. Normal compressibility and flow on color Doppler imaging. Profunda Femoral Vein: No evidence of thrombus. Normal compressibility and flow on color Doppler imaging. Femoral Vein: No evidence of thrombus. Normal compressibility, respiratory phasicity and response to augmentation. Popliteal Vein: No evidence of thrombus. Normal compressibility, respiratory phasicity and response to augmentation. Calf Veins: No evidence of thrombus. Normal compressibility and flow on color Doppler imaging. Superficial Great Saphenous Vein: No evidence of thrombus. Normal compressibility. Venous Reflux:  None. Other Findings:  None. LEFT  LOWER EXTREMITY Common Femoral Vein: No evidence of thrombus. Normal compressibility, respiratory phasicity and response to augmentation. Saphenofemoral Junction: No evidence of thrombus. Normal compressibility and flow on color Doppler imaging. Profunda Femoral Vein: No evidence of thrombus. Normal compressibility and flow on color Doppler imaging. Femoral Vein: No evidence of thrombus. Normal compressibility, respiratory phasicity and response to augmentation. Popliteal Vein: No evidence of thrombus. Normal compressibility, respiratory phasicity and response to augmentation. Calf Veins: No evidence of thrombus. Normal compressibility and flow on color Doppler imaging. Superficial Great Saphenous Vein: No evidence of thrombus. Normal compressibility. Venous Reflux:  None. Other Findings:  None. IMPRESSION: No evidence of deep venous thrombosis. Electronically Signed   By: Jerilynn Mages.  Shick M.D.   On: 01/27/2018 11:02   Dg Chest Port 1 View  Result Date: 01/25/2018 CLINICAL DATA:  Fever, increased weakness, history bladder cancer, rectal cancer, former smoker EXAM: PORTABLE CHEST 1 VIEW COMPARISON:  Portable exam 1703 hours compared 04/12/2011 and correlated with a CT chest exam of 01/03/2018 FINDINGS: Normal heart size, mediastinal contours, and pulmonary vascularity. Lungs clear. No acute infiltrate, pleural effusion, or pneumothorax. Osseous structures unremarkable. IMPRESSION: No acute abnormalities. Electronically Signed   By: Elta Guadeloupe  Thornton Papas M.D.   On: 01/25/2018 17:16      Subjective: Pt says that he is feeling much better, he has no fever or chills.  He has been eating and drinking much better.    Discharge Exam: Vitals:   01/27/18 2103 01/28/18 0613  BP: (!) 151/87 131/79  Pulse: 83 88  Resp: 17 16  Temp: 98.3 F (36.8 C) 98.6 F (37 C)  SpO2: 98% 96%   Vitals:   01/27/18 1411 01/27/18 1800 01/27/18 2103 01/28/18 0613  BP: 123/74  (!) 151/87 131/79  Pulse: 88  83 88  Resp: 18  17 16   Temp:  98.4 F (36.9 C) 98.8 F (37.1 C) 98.3 F (36.8 C) 98.6 F (37 C)  TempSrc: Oral Oral Oral Oral  SpO2: 98%  98% 96%  Weight:      Height:       General: Pt is alert, awake, not in acute distress Cardiovascular: RRR, S1/S2 +, no rubs, no gallops Respiratory: CTA bilaterally, no wheezing, no rhonchi Abdominal: Soft, NT, ND, bowel sounds + Extremities: no edema, no cyanosis   The results of significant diagnostics from this hospitalization (including imaging, microbiology, ancillary and laboratory) are listed below for reference.    Microbiology: Recent Results (from the past 240 hour(s))  Blood Culture (routine x 2)     Status: None (Preliminary result)   Collection Time: 01/25/18  4:54 PM  Result Value Ref Range Status   Specimen Description BLOOD RIGHT FOREARM  Final   Special Requests   Final    BOTTLES DRAWN AEROBIC AND ANAEROBIC Blood Culture adequate volume   Culture   Final    NO GROWTH 2 DAYS Performed at [facility] [address], [place], [place] [address]    Report Status PENDING  Incomplete  Blood Culture (routine x 2)     Status: None (Preliminary result)   Collection Time: 01/25/18  5:13 PM  Result Value Ref Range Status   Specimen Description BLOOD LEFT HAND  Final   Special Requests   Final    BOTTLES DRAWN AEROBIC AND ANAEROBIC Blood Culture results may not be optimal due to an excessive volume of blood received in culture bottles   Culture   Final    NO GROWTH 2 DAYS Performed at [facility], [address], [place], [place] [address]    Report Status PENDING  Incomplete  Culture, Urine     Status: Abnormal   Collection Time: 01/25/18  5:52 PM  Result Value Ref Range Status   Specimen Description   Final    URINE, RANDOM Performed at [facility], [address] [place], [place] [address]    Special Requests   Final    NONE Performed at [facility], [address], [place], [place] [address]    Culture MULTIPLE ORGANISMS PRESENT, NONE PREDOMINANT  (A)  Final   Report Status 01/27/2018 FINAL  Final     Labs: BNP (last 3 results) Recent Labs    01/25/18 1654  BNP 82.4   Basic Metabolic Panel: Recent Labs  Lab 01/25/18 1654 01/26/18 0425 01/27/18 0449 01/28/18 0603  NA 136 135 136 135  K 4.4 3.8 3.7 3.7  CL 97* 101 99* 98*  CO2 24 27 29  TEST NOT PERFORMED, REAGENT NOT AVAILABLE  GLUCOSE 180* 129* 109* 96  BUN 17 13 11 10   CREATININE 1.13 0.90 0.77 0.73  CALCIUM 9.2 8.2* 8.3* 8.4*   Liver Function Tests: Recent Labs  Lab 01/25/18 1654  01/28/18 0603  AST 40 50*  ALT 47 42  ALKPHOS 69 48  BILITOT 0.8 0.1*  PROT 6.5 5.2*  ALBUMIN 2.5* 2.1*   Recent Labs  Lab 01/25/18 1654  LIPASE 23   No results for input(s): AMMONIA in the last 168 hours. CBC: Recent Labs  Lab 01/25/18 1654 01/26/18 0425 01/27/18 0449 01/28/18 0603  WBC 23.9* 20.5* 9.0 6.4  NEUTROABS 21.3* 18.3*  --   --   HGB 12.1* 9.5* 9.6* 10.0*  HCT 37.0* 29.7* 29.8* 30.5*  MCV 102.2* 103.1* 102.1* 101.7*  PLT 436* 296 265 281   Cardiac Enzymes: Recent Labs  Lab 01/25/18 1654  TROPONINI <0.03   BNP: Invalid input(s): POCBNP CBG: Recent Labs  Lab 01/27/18 0735 01/27/18 1132 01/27/18 1607 01/27/18 2155 01/28/18 0726  GLUCAP 105* 121* 104* 124* 108*   D-Dimer No results for input(s): DDIMER in the last 72 hours. Hgb A1c No results for input(s): HGBA1C in the last 72 hours. Lipid Profile No results for input(s): CHOL, HDL, LDLCALC, TRIG, CHOLHDL, LDLDIRECT in the last 72 hours. Thyroid function studies No results for input(s): TSH, T4TOTAL, T3FREE, THYROIDAB in the last 72 hours.  Invalid input(s): FREET3 Anemia work up No results for input(s): VITAMINB12, FOLATE, FERRITIN, TIBC, IRON, RETICCTPCT in the last 72 hours. Urinalysis    Component Value Date/Time   COLORURINE YELLOW 01/25/2018 1752   APPEARANCEUR CLOUDY (A) 01/25/2018 1752   LABSPEC 1.010 01/25/2018 1752   PHURINE 6.0 01/25/2018 1752   GLUCOSEU NEGATIVE  01/25/2018 1752   HGBUR SMALL (A) 01/25/2018 1752   BILIRUBINUR NEGATIVE 01/25/2018 1752   KETONESUR NEGATIVE 01/25/2018 1752   PROTEINUR 100 (A) 01/25/2018 1752   NITRITE NEGATIVE 01/25/2018 1752   LEUKOCYTESUR SMALL (A) 01/25/2018 1752   Sepsis Labs Invalid input(s): PROCALCITONIN,  WBC,  LACTICIDVEN Microbiology Recent Results (from the past 240 hour(s))  Blood Culture (routine x 2)     Status: None (Preliminary result)   Collection Time: 01/25/18  4:54 PM  Result Value Ref Range Status   Specimen Description BLOOD RIGHT FOREARM  Final   Special Requests   Final    BOTTLES DRAWN AEROBIC AND ANAEROBIC Blood Culture adequate volume   Culture   Final    NO GROWTH 2 DAYS Performed at [facility], [address], [place], [place] [address]    Report Status PENDING  Incomplete  Blood Culture (routine x 2)     Status: None (Preliminary result)   Collection Time: 01/25/18  5:13 PM  Result Value Ref Range Status   Specimen Description BLOOD LEFT HAND  Final   Special Requests   Final    BOTTLES DRAWN AEROBIC AND ANAEROBIC Blood Culture results may not be optimal due to an excessive volume of blood received in culture bottles   Culture   Final    NO GROWTH 2 DAYS Performed at [facility], [address] [place], [place] [address]    Report Status PENDING  Incomplete  Culture, Urine     Status: Abnormal   Collection Time: 01/25/18  5:52 PM  Result Value Ref Range Status   Specimen Description   Final    URINE, RANDOM Performed at [facility], [address], [place], [place] [address]    Special Requests   Final    NONE Performed at [facility], [address] [place], [place] [address]    Culture MULTIPLE ORGANISMS PRESENT, NONE PREDOMINANT (A)  Final   Report Status 01/27/2018 FINAL  Final  Time coordinating discharge: 35 Minutes  SIGNED:  Irwin Brakeman, MD  [facility] 01/28/2018, 9:21 AM Pager [phone]  If 7PM-7AM, please contact  night-coverage [email or web address] Password [email or web address]

## 2018-01-30 ENCOUNTER — Inpatient Hospital Stay: Payer: Medicare Other

## 2018-01-30 ENCOUNTER — Other Ambulatory Visit: Payer: Self-pay | Admitting: Radiation Oncology

## 2018-01-30 ENCOUNTER — Telehealth: Payer: Self-pay | Admitting: Radiation Oncology

## 2018-01-30 VITALS — BP 125/73 | HR 97 | Temp 98.3°F | Resp 18 | Ht 71.0 in | Wt 193.1 lb

## 2018-01-30 DIAGNOSIS — K5900 Constipation, unspecified: Secondary | ICD-10-CM | POA: Diagnosis not present

## 2018-01-30 DIAGNOSIS — C679 Malignant neoplasm of bladder, unspecified: Secondary | ICD-10-CM | POA: Diagnosis not present

## 2018-01-30 DIAGNOSIS — C7800 Secondary malignant neoplasm of unspecified lung: Secondary | ICD-10-CM | POA: Diagnosis not present

## 2018-01-30 DIAGNOSIS — Z5111 Encounter for antineoplastic chemotherapy: Secondary | ICD-10-CM | POA: Diagnosis not present

## 2018-01-30 DIAGNOSIS — R21 Rash and other nonspecific skin eruption: Secondary | ICD-10-CM | POA: Diagnosis not present

## 2018-01-30 DIAGNOSIS — Z923 Personal history of irradiation: Secondary | ICD-10-CM | POA: Diagnosis not present

## 2018-01-30 DIAGNOSIS — Z9221 Personal history of antineoplastic chemotherapy: Secondary | ICD-10-CM | POA: Diagnosis not present

## 2018-01-30 DIAGNOSIS — G893 Neoplasm related pain (acute) (chronic): Secondary | ICD-10-CM | POA: Diagnosis not present

## 2018-01-30 DIAGNOSIS — C7951 Secondary malignant neoplasm of bone: Secondary | ICD-10-CM | POA: Diagnosis not present

## 2018-01-30 LAB — CULTURE, BLOOD (ROUTINE X 2)
Culture: NO GROWTH
Culture: NO GROWTH
Special Requests: ADEQUATE

## 2018-01-30 LAB — COMPREHENSIVE METABOLIC PANEL
ALT: 59 U/L — ABNORMAL HIGH (ref 0–55)
AST: 49 U/L — ABNORMAL HIGH (ref 5–34)
Albumin: 2.2 g/dL — ABNORMAL LOW (ref 3.5–5.0)
Alkaline Phosphatase: 58 U/L (ref 40–150)
Anion gap: 10 (ref 3–11)
BUN: 12 mg/dL (ref 7–26)
CO2: 27 mmol/L (ref 22–29)
Calcium: 9.3 mg/dL (ref 8.4–10.4)
Chloride: 98 mmol/L (ref 98–109)
Creatinine, Ser: 0.74 mg/dL (ref 0.70–1.30)
GFR calc Af Amer: >60 mL/min (ref >60)
GFR calc non Af Amer: >60 mL/min (ref >60)
Glucose, Bld: 150 mg/dL — ABNORMAL HIGH (ref 70–140)
Potassium: 3.6 mmol/L (ref 3.5–5.1)
Sodium: 135 mmol/L — ABNORMAL LOW (ref 136–145)
Total Bilirubin: 0.3 mg/dL (ref 0.2–1.2)
Total Protein: 5.8 g/dL — ABNORMAL LOW (ref 6.4–8.3)

## 2018-01-30 LAB — CBC WITH DIFFERENTIAL/PLATELET
Basophils Absolute: 0.1 10*3/uL (ref 0.0–0.1)
Basophils Relative: 1 %
Eosinophils Absolute: 0.1 10*3/uL (ref 0.0–0.5)
Eosinophils Relative: 1 %
HCT: 33.4 % — ABNORMAL LOW (ref 38.4–49.9)
Hemoglobin: 11.1 g/dL — ABNORMAL LOW (ref 13.0–17.1)
Lymphocytes Relative: 4 %
Lymphs Abs: 0.3 10*3/uL — ABNORMAL LOW (ref 0.9–3.3)
MCH: 33.1 pg (ref 27.2–33.4)
MCHC: 33.2 g/dL (ref 32.0–36.0)
MCV: 99.8 fL — ABNORMAL HIGH (ref 79.3–98.0)
Monocytes Absolute: 0.7 10*3/uL (ref 0.1–0.9)
Monocytes Relative: 8 %
Neutro Abs: 7.6 10*3/uL — ABNORMAL HIGH (ref 1.5–6.5)
Neutrophils Relative %: 86 %
Platelets: 376 10*3/uL (ref 140–400)
RBC: 3.35 MIL/uL — ABNORMAL LOW (ref 4.20–5.82)
RDW: 13.5 % (ref 11.0–14.6)
WBC: 8.8 10*3/uL (ref 4.0–10.3)

## 2018-01-30 MED ORDER — PALONOSETRON HCL INJECTION 0.25 MG/5ML
INTRAVENOUS | Status: AC
  Filled 2018-01-30: qty 5

## 2018-01-30 MED ORDER — OXYCODONE HCL 5 MG PO TABS: 5.0000 mg | ORAL_TABLET | ORAL | 0 refills | Status: DC | PRN

## 2018-01-30 MED ORDER — SODIUM CHLORIDE 0.9 % IV SOLN
400.0000 mg | Freq: Once | INTRAVENOUS | Status: AC
  Administered 2018-01-30: 400 mg via INTRAVENOUS
  Filled 2018-01-30: qty 40

## 2018-01-30 MED ORDER — SODIUM CHLORIDE 0.9 % IV SOLN
1000.0000 mg/m2 | Freq: Once | INTRAVENOUS | Status: AC
  Administered 2018-01-30: 2128 mg via INTRAVENOUS
  Filled 2018-01-30: qty 55.97

## 2018-01-30 MED ORDER — PALONOSETRON HCL INJECTION 0.25 MG/5ML
0.2500 mg | Freq: Once | INTRAVENOUS | Status: AC
  Administered 2018-01-30: 0.25 mg via INTRAVENOUS

## 2018-01-30 MED ORDER — SODIUM CHLORIDE 0.9 % IV SOLN
Freq: Once | INTRAVENOUS | Status: AC
  Administered 2018-01-30: 09:00:00 via INTRAVENOUS

## 2018-01-30 MED ORDER — SODIUM CHLORIDE 0.9 % IV SOLN
Freq: Once | INTRAVENOUS | Status: AC
  Administered 2018-01-30: 10:00:00 via INTRAVENOUS
  Filled 2018-01-30: qty 5

## 2018-01-30 NOTE — Patient Instructions (Addendum)
[facility] Discharge Instructions for Patients Receiving Chemotherapy  Today you received the following chemotherapy agents Gemzar,Carboplatin  To help prevent nausea and vomiting after your treatment, we encourage you to take your nausea medication as directed  If you develop nausea and vomiting that is not controlled by your nausea medication, call the clinic.   BELOW ARE SYMPTOMS THAT SHOULD BE REPORTED IMMEDIATELY:  *FEVER GREATER THAN 100.5 F  *CHILLS WITH OR WITHOUT FEVER  NAUSEA AND VOMITING THAT IS NOT CONTROLLED WITH YOUR NAUSEA MEDICATION  *UNUSUAL SHORTNESS OF BREATH  *UNUSUAL BRUISING OR BLEEDING  TENDERNESS IN MOUTH AND THROAT WITH OR WITHOUT PRESENCE OF ULCERS  *URINARY PROBLEMS  *BOWEL PROBLEMS  UNUSUAL RASH Items with * indicate a potential emergency and should be followed up as soon as possible.  Feel free to call the clinic should you have any questions or concerns. The clinic phone number is [phone].  Please show the [facility] at check-in to the Emergency Department and triage nurse.   Gemcitabine injection What is this medicine? GEMCITABINE (jem SIT a been) is a chemotherapy drug. This medicine is used to treat many types of cancer like breast cancer, lung cancer, pancreatic cancer, and ovarian cancer. This medicine may be used for other purposes; ask your health care provider or pharmacist if you have questions. COMMON BRAND NAME(S): Gemzar What should I tell my health care provider before I take this medicine? They need to know if you have any of these conditions: -blood disorders -infection -kidney disease -liver disease -recent or ongoing radiation therapy -an unusual or allergic reaction to gemcitabine, other chemotherapy, other medicines, foods, dyes, or preservatives -pregnant or trying to get pregnant -breast-feeding How should I use this medicine? This drug is given as an infusion into a vein. It is  administered in a hospital or clinic by a specially trained health care professional. Talk to your pediatrician regarding the use of this medicine in children. Special care may be needed. Overdosage: If you think you have taken too much of this medicine contact a poison control center or emergency room at once. NOTE: This medicine is only for you. Do not share this medicine with others. What if I miss a dose? It is important not to miss your dose. Call your doctor or health care professional if you are unable to keep an appointment. What may interact with this medicine? -medicines to increase blood counts like filgrastim, pegfilgrastim, sargramostim -some other chemotherapy drugs like cisplatin -vaccines Talk to your doctor or health care professional before taking any of these medicines: -acetaminophen -aspirin -ibuprofen -ketoprofen -naproxen This list may not describe all possible interactions. Give your health care provider a list of all the medicines, herbs, non-prescription drugs, or dietary supplements you use. Also tell them if you smoke, drink alcohol, or use illegal drugs. Some items may interact with your medicine. What should I watch for while using this medicine? Visit your doctor for checks on your progress. This drug may make you feel generally unwell. This is not uncommon, as chemotherapy can affect healthy cells as well as cancer cells. Report any side effects. Continue your course of treatment even though you feel ill unless your doctor tells you to stop. In some cases, you may be given additional medicines to help with side effects. Follow all directions for their use. Call your doctor or health care professional for advice if you get a fever, chills or sore throat, or other symptoms of a cold or  flu. Do not treat yourself. This drug decreases your body's ability to fight infections. Try to avoid being around people who are sick. This medicine may increase your risk to bruise  or bleed. Call your doctor or health care professional if you notice any unusual bleeding. Be careful brushing and flossing your teeth or using a toothpick because you may get an infection or bleed more easily. If you have any dental work done, tell your dentist you are receiving this medicine. Avoid taking products that contain aspirin, acetaminophen, ibuprofen, naproxen, or ketoprofen unless instructed by your doctor. These medicines may hide a fever. Women should inform their doctor if they wish to become pregnant or think they might be pregnant. There is a potential for serious side effects to an unborn child. Talk to your health care professional or pharmacist for more information. Do not breast-feed an infant while taking this medicine. What side effects may I notice from receiving this medicine? Side effects that you should report to your doctor or health care professional as soon as possible: -allergic reactions like skin rash, itching or hives, swelling of the face, lips, or tongue -low blood counts - this medicine may decrease the number of white blood cells, red blood cells and platelets. You may be at increased risk for infections and bleeding. -signs of infection - fever or chills, cough, sore throat, pain or difficulty passing urine -signs of decreased platelets or bleeding - bruising, pinpoint red spots on the skin, black, tarry stools, blood in the urine -signs of decreased red blood cells - unusually weak or tired, fainting spells, lightheadedness -breathing problems -chest pain -mouth sores -nausea and vomiting -pain, swelling, redness at site where injected -pain, tingling, numbness in the hands or feet -stomach pain -swelling of ankles, feet, hands -unusual bleeding Side effects that usually do not require medical attention (report to your doctor or health care professional if they continue or are bothersome): -constipation -diarrhea -hair loss -loss of appetite -stomach  upset This list may not describe all possible side effects. Call your doctor for medical advice about side effects. You may report side effects to FDA at 1-800-FDA-1088. Where should I keep my medicine? This drug is given in a hospital or clinic and will not be stored at home. NOTE: This sheet is a summary. It may not cover all possible information. If you have questions about this medicine, talk to your doctor, pharmacist, or health care provider.  2018 Elsevier/Gold Standard (2007-12-12 18:45:54)   Carboplatin injection What is this medicine? CARBOPLATIN (KAR boe pla tin) is a chemotherapy drug. It targets fast dividing cells, like cancer cells, and causes these cells to die. This medicine is used to treat ovarian cancer and many other cancers. This medicine may be used for other purposes; ask your health care provider or pharmacist if you have questions. COMMON BRAND NAME(S): Paraplatin What should I tell my health care provider before I take this medicine? They need to know if you have any of these conditions: -blood disorders -hearing problems -kidney disease -recent or ongoing radiation therapy -an unusual or allergic reaction to carboplatin, cisplatin, other chemotherapy, other medicines, foods, dyes, or preservatives -pregnant or trying to get pregnant -breast-feeding How should I use this medicine? This drug is usually given as an infusion into a vein. It is administered in a hospital or clinic by a specially trained health care professional. Talk to your pediatrician regarding the use of this medicine in children. Special care may be needed. Overdosage: If  you think you have taken too much of this medicine contact a poison control center or emergency room at once. NOTE: This medicine is only for you. Do not share this medicine with others. What if I miss a dose? It is important not to miss a dose. Call your doctor or health care professional if you are unable to keep an  appointment. What may interact with this medicine? -medicines for seizures -medicines to increase blood counts like filgrastim, pegfilgrastim, sargramostim -some antibiotics like amikacin, gentamicin, neomycin, streptomycin, tobramycin -vaccines Talk to your doctor or health care professional before taking any of these medicines: -acetaminophen -aspirin -ibuprofen -ketoprofen -naproxen This list may not describe all possible interactions. Give your health care provider a list of all the medicines, herbs, non-prescription drugs, or dietary supplements you use. Also tell them if you smoke, drink alcohol, or use illegal drugs. Some items may interact with your medicine. What should I watch for while using this medicine? Your condition will be monitored carefully while you are receiving this medicine. You will need important blood work done while you are taking this medicine. This drug may make you feel generally unwell. This is not uncommon, as chemotherapy can affect healthy cells as well as cancer cells. Report any side effects. Continue your course of treatment even though you feel ill unless your doctor tells you to stop. In some cases, you may be given additional medicines to help with side effects. Follow all directions for their use. Call your doctor or health care professional for advice if you get a fever, chills or sore throat, or other symptoms of a cold or flu. Do not treat yourself. This drug decreases your body's ability to fight infections. Try to avoid being around people who are sick. This medicine may increase your risk to bruise or bleed. Call your doctor or health care professional if you notice any unusual bleeding. Be careful brushing and flossing your teeth or using a toothpick because you may get an infection or bleed more easily. If you have any dental work done, tell your dentist you are receiving this medicine. Avoid taking products that contain aspirin, acetaminophen,  ibuprofen, naproxen, or ketoprofen unless instructed by your doctor. These medicines may hide a fever. Do not become pregnant while taking this medicine. Women should inform their doctor if they wish to become pregnant or think they might be pregnant. There is a potential for serious side effects to an unborn child. Talk to your health care professional or pharmacist for more information. Do not breast-feed an infant while taking this medicine. What side effects may I notice from receiving this medicine? Side effects that you should report to your doctor or health care professional as soon as possible: -allergic reactions like skin rash, itching or hives, swelling of the face, lips, or tongue -signs of infection - fever or chills, cough, sore throat, pain or difficulty passing urine -signs of decreased platelets or bleeding - bruising, pinpoint red spots on the skin, black, tarry stools, nosebleeds -signs of decreased red blood cells - unusually weak or tired, fainting spells, lightheadedness -breathing problems -changes in hearing -changes in vision -chest pain -high blood pressure -low blood counts - This drug may decrease the number of white blood cells, red blood cells and platelets. You may be at increased risk for infections and bleeding. -nausea and vomiting -pain, swelling, redness or irritation at the injection site -pain, tingling, numbness in the hands or feet -problems with balance, talking, walking -trouble passing urine  or change in the amount of urine Side effects that usually do not require medical attention (report to your doctor or health care professional if they continue or are bothersome): -hair loss -loss of appetite -metallic taste in the mouth or changes in taste This list may not describe all possible side effects. Call your doctor for medical advice about side effects. You may report side effects to FDA at 1-800-FDA-1088. Where should I keep my medicine? This drug  is given in a hospital or clinic and will not be stored at home. NOTE: This sheet is a summary. It may not cover all possible information. If you have questions about this medicine, talk to your doctor, pharmacist, or health care provider.  2018 Elsevier/Gold Standard (2007-11-07 14:38:05).

## 2018-01-30 NOTE — Progress Notes (Signed)
Discharge instructions reviewed and given to the pt.  Pt verbalized understanding. 

## 2018-01-30 NOTE — Telephone Encounter (Signed)
Received voicemail message from patient's wife requesting a refill of his Dilaudid and Oxycodone IR. Also, requested to know MRI of brain results from 01/22/2018. Called wife back. Explained that per Shona Simpson, PA-C his brain MRI is free of metastatic disease. Explained oxycodone IR refill has been escribed by Shona Simpson, PA-C to [facility]. Finally, per Bryson Ha I explained that Dilaudid and Oxy IR are both short acting medications and two short acting medications aren't necessary thus Oxy IR was the only one refilled. Wife reports the patient is only able to receive relief of his pain when he takes either one Oxy IR and one Dilaudid or two Oxy IR and one Dilaudid. She request enough Dilaudid to hold them over until they can follow up with Dr. Alen Blew on Monday, June 24th. Wife understands this RN will report these findings to Shona Simpson, PA-C and phone back with further information about Dilaudid refill

## 2018-02-01 ENCOUNTER — Telehealth: Payer: Self-pay | Admitting: *Deleted

## 2018-02-01 NOTE — Telephone Encounter (Signed)
Spoke with patient, eating and drinking well. No c/o. Knows to call desk nurse for any problems.

## 2018-02-02 ENCOUNTER — Telehealth: Payer: Self-pay | Admitting: *Deleted

## 2018-02-02 NOTE — Telephone Encounter (Signed)
rec'd call from family. States patient is itching on his back since getting chemotherapy. Wanted to know if it was okay to take benedryl 25 mg PO?  Yes. Call back if not helpful and exacerbates or persists.

## 2018-02-03 ENCOUNTER — Telehealth: Payer: Self-pay | Admitting: *Deleted

## 2018-02-03 ENCOUNTER — Other Ambulatory Visit: Payer: Self-pay | Admitting: *Deleted

## 2018-02-03 MED ORDER — HYDROXYZINE HCL 50 MG PO TABS: 50.0000 mg | ORAL_TABLET | Freq: Four times a day (QID) | ORAL | 0 refills | Status: DC | PRN

## 2018-02-03 NOTE — Telephone Encounter (Signed)
Spoke with patient and wife. Per dr Alen Blew. Atarax called to [place] for c/o itching, not relieved with benadryl.

## 2018-02-03 NOTE — Telephone Encounter (Signed)
Atarax 50 mg QID PRN. #[id number]

## 2018-02-03 NOTE — Telephone Encounter (Signed)
Patient calling back today.  with itching. Itching started after chemo treatment 01/30/18 after getting carbo/gemzar. Itches under his arms neck and chest. Benadryl has not helped. Denies fever or burning. Requesting something stronger for itching.

## 2018-02-06 ENCOUNTER — Inpatient Hospital Stay: Payer: Medicare Other

## 2018-02-06 ENCOUNTER — Inpatient Hospital Stay (HOSPITAL_BASED_OUTPATIENT_CLINIC_OR_DEPARTMENT_OTHER): Payer: Medicare Other | Admitting: Oncology

## 2018-02-06 ENCOUNTER — Telehealth: Payer: Self-pay | Admitting: Oncology

## 2018-02-06 ENCOUNTER — Other Ambulatory Visit: Payer: Self-pay | Admitting: Radiology

## 2018-02-06 VITALS — BP 133/78 | HR 102 | Temp 98.3°F | Resp 18 | Ht 71.0 in | Wt 188.6 lb

## 2018-02-06 DIAGNOSIS — R21 Rash and other nonspecific skin eruption: Secondary | ICD-10-CM

## 2018-02-06 DIAGNOSIS — Z9221 Personal history of antineoplastic chemotherapy: Secondary | ICD-10-CM | POA: Diagnosis not present

## 2018-02-06 DIAGNOSIS — Z923 Personal history of irradiation: Secondary | ICD-10-CM

## 2018-02-06 DIAGNOSIS — C679 Malignant neoplasm of bladder, unspecified: Secondary | ICD-10-CM

## 2018-02-06 DIAGNOSIS — C7951 Secondary malignant neoplasm of bone: Secondary | ICD-10-CM

## 2018-02-06 DIAGNOSIS — C7800 Secondary malignant neoplasm of unspecified lung: Secondary | ICD-10-CM

## 2018-02-06 DIAGNOSIS — G893 Neoplasm related pain (acute) (chronic): Secondary | ICD-10-CM

## 2018-02-06 DIAGNOSIS — Z5111 Encounter for antineoplastic chemotherapy: Secondary | ICD-10-CM | POA: Diagnosis not present

## 2018-02-06 LAB — CMP (CANCER CENTER ONLY)
ALT: 156 U/L — ABNORMAL HIGH (ref 0–55)
AST: 95 U/L — ABNORMAL HIGH (ref 5–34)
Albumin: 2.4 g/dL — ABNORMAL LOW (ref 3.5–5.0)
Alkaline Phosphatase: 61 U/L (ref 40–150)
Anion gap: 9 (ref 3–11)
BUN: 14 mg/dL (ref 7–26)
CO2: 29 mmol/L (ref 22–29)
Calcium: 9.6 mg/dL (ref 8.4–10.4)
Chloride: 95 mmol/L — ABNORMAL LOW (ref 98–109)
Creatinine: 0.77 mg/dL (ref 0.70–1.30)
GFR, Est AFR Am: >60 mL/min (ref >60)
GFR, Estimated: >60 mL/min (ref >60)
Glucose, Bld: 132 mg/dL (ref 70–140)
Potassium: 3.9 mmol/L (ref 3.5–5.1)
Sodium: 133 mmol/L — ABNORMAL LOW (ref 136–145)
Total Bilirubin: 0.4 mg/dL (ref 0.2–1.2)
Total Protein: 6.4 g/dL (ref 6.4–8.3)

## 2018-02-06 LAB — CBC WITH DIFFERENTIAL (CANCER CENTER ONLY)
Basophils Absolute: 0 10*3/uL (ref 0.0–0.1)
Basophils Relative: 0 %
Eosinophils Absolute: 0.2 10*3/uL (ref 0.0–0.5)
Eosinophils Relative: 3 %
HCT: 33.7 % — ABNORMAL LOW (ref 38.4–49.9)
Hemoglobin: 10.9 g/dL — ABNORMAL LOW (ref 13.0–17.1)
Lymphocytes Relative: 10 %
Lymphs Abs: 0.6 10*3/uL — ABNORMAL LOW (ref 0.9–3.3)
MCH: 32 pg (ref 27.2–33.4)
MCHC: 32.3 g/dL (ref 32.0–36.0)
MCV: 98.8 fL — ABNORMAL HIGH (ref 79.3–98.0)
Monocytes Absolute: 0.8 10*3/uL (ref 0.1–0.9)
Monocytes Relative: 13 %
Neutro Abs: 4.5 10*3/uL (ref 1.5–6.5)
Neutrophils Relative %: 74 %
Platelet Count: 215 10*3/uL (ref 140–400)
RBC: 3.41 MIL/uL — ABNORMAL LOW (ref 4.20–5.82)
RDW: 12.9 % (ref 11.0–14.6)
WBC Count: 6 10*3/uL (ref 4.0–10.3)

## 2018-02-06 MED ORDER — PROCHLORPERAZINE MALEATE 10 MG PO TABS
10.0000 mg | ORAL_TABLET | Freq: Once | ORAL | Status: AC
  Administered 2018-02-06: 10 mg via ORAL

## 2018-02-06 MED ORDER — FAMOTIDINE IN NACL 20-0.9 MG/50ML-% IV SOLN
INTRAVENOUS | Status: AC
  Filled 2018-02-06: qty 50

## 2018-02-06 MED ORDER — SODIUM CHLORIDE 0.9 % IV SOLN
Freq: Once | INTRAVENOUS | Status: AC
Start: 2018-02-06 — End: 2018-02-06
  Administered 2018-02-06: 10:00:00 via INTRAVENOUS

## 2018-02-06 MED ORDER — DIPHENHYDRAMINE HCL 25 MG PO CAPS
25.0000 mg | ORAL_CAPSULE | Freq: Once | ORAL | Status: AC
  Administered 2018-02-06: 25 mg via ORAL

## 2018-02-06 MED ORDER — FAMOTIDINE IN NACL 20-0.9 MG/50ML-% IV SOLN
20.0000 mg | Freq: Once | INTRAVENOUS | Status: AC
  Administered 2018-02-06: 20 mg via INTRAVENOUS

## 2018-02-06 MED ORDER — DIPHENHYDRAMINE HCL 25 MG PO CAPS
ORAL_CAPSULE | ORAL | Status: AC
  Filled 2018-02-06: qty 1

## 2018-02-06 MED ORDER — GEMCITABINE HCL CHEMO INJECTION 1 GM/26.3ML
1000.0000 mg/m2 | Freq: Once | INTRAVENOUS | Status: AC
  Administered 2018-02-06: 2128 mg via INTRAVENOUS
  Filled 2018-02-06: qty 55.97

## 2018-02-06 MED ORDER — PREDNISONE 10 MG (21) PO TBPK: ORAL_TABLET | ORAL | 0 refills | Status: DC

## 2018-02-06 MED ORDER — PROCHLORPERAZINE MALEATE 10 MG PO TABS
ORAL_TABLET | ORAL | Status: AC
  Filled 2018-02-06: qty 1

## 2018-02-06 MED ORDER — HYDROMORPHONE HCL 4 MG PO TABS: 4.0000 mg | ORAL_TABLET | ORAL | 0 refills | Status: DC | PRN

## 2018-02-06 NOTE — Progress Notes (Signed)
Hematology and Oncology Follow Up Visit  [name] [id number] [birth year] 77 y.o. 02/06/2018 9:01 AM [name], MDFusco, Purcell Nails, MD   Principle Diagnosis: 77 year-old man with stage IV bladder cancer with a documented disease to the pelvic bone in April 2019.  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  His tumor is PDL 1 positive  with CPS score over 90%.  Prior Therapy:   He is status post neoadjuvant chemotherapy utilizing gemcitabine and cisplatin started in September 2018.  He received day 1 of cycle 1 on 05/02/2017 and therapy discontinued after that.  He is status post laparoscopic Cystoprostatectomy and bilateral lymphadenectomy done by Dr. Tresa Moore on 06/29/2017.  A final pathology showed a T3bN0 disease.  He has 13 lymph nodes sampled without any evidence of malignancy.  He completed radiation therapy to the pelvis on January 16, 2018 under the care of Dr. Tammi Klippel.  Current therapy: Carboplatin and gemcitabine started on 01/30/2018.  Today is day 8 of cycle 1.  Interim History: [name] is here for a follow-up.  Since the last visit, he received the first cycle of chemotherapy without complications.  He did develop urinary tract infection prior to that required hospitalization.  He presented with symptoms of fevers and shaking chills on 01/25/2018 and was discharged on 01/28/2018.  He was started on vancomycin and Zosyn and subsequently switched to cefepime and then was discharged on oral cephalexin.  He did develop pruritus and rash on his right flank.  His rash is erythematous and has been bothering him and keeping him from sleep.  He tried Benadryl and Atarax without any improvement of symptoms.  His pain is also improved and is able to sit down and keep better since the start of chemotherapy.  His appetite has decreased and of lost some weight.  He does not report any headaches, blurry vision, syncope or seizures.  He denies any  neuropathy or confusion.  He does not report any fevers, chills or sweats. He does not report any cough, wheezing or dyspnea on exertion. He does not report any chest pain, palpitation, orthopnea or leg edema. He does not report any nausea, vomiting, abdominal pain,diarrhea.  He denied constipation or early satiety.. He does not report any frequency urgency or hesitancy.  He denies any ecchymosis.  Denies any thrombosis or bleeding tendencies.  Denies any mood changes.  Denies any heat or cold intolerance.  Remaining review of systems is negative.  Medications: I have reviewed the patient's current medications.  Current Outpatient Medications  Medication Sig Dispense Refill  . allopurinol (ZYLOPRIM) 100 MG tablet Take 100 mg by mouth daily.    . hydrOXYzine (ATARAX/VISTARIL) 50 MG tablet Take 1 tablet (50 mg total) by mouth 4 (four) times daily as needed. 30 tablet 0  . lidocaine-prilocaine (EMLA) cream Apply 1 application topically as needed. 30 g 0  . oxyCODONE (OXY IR/ROXICODONE) 5 MG immediate release tablet Take 1-3 tablets (5-15 mg total) by mouth every 4 (four) hours as needed for severe pain. 90 tablet 0  . prochlorperazine (COMPAZINE) 10 MG tablet Take 1 tablet (10 mg total) by mouth every 6 (six) hours as needed for nausea or vomiting. 30 tablet 0   No current facility-administered medications for this visit.      Allergies:  Allergies  Allergen Reactions  . Ciprofloxacin Hives    Past Medical History, Surgical history, Social history, and Family History were reviewed and updated.  Physical Exam:  Blood pressure 133/78, pulse (!) 102, temperature 98.3 F (36.8 C), temperature source Oral, resp. rate 18, height 5\' 11"  (1.803 m), weight 188 lb 9.6 oz (85.5 kg), SpO2 97 %.   ECOG: 1 General appearance: Well-appearing gentleman without distress. Head: Normocephalic without abnormalities. Oropharynx: Mucous membranes are moist and pink. Eyes: Sclera anicteric. Lymph nodes: No  no lymphadenopathy noted in the cervical, supraclavicular, inguinal or axillary regions. Heart: Regular rate and rhythm.  S1 and S2 without murmurs. Lung: clear without any rhonchi, wheezes or dullness to percussion. Abdomin: Soft, without any rebound or guarding.  No shifting dullness or ascites. Musculoskeletal: No joint deformity or effusion.  Full range of motion noted. Skin: Diffuse erythema noted on his right flank and left axillary region.  No petechiae or ecchymosis noted. Neurological: Motor and sensory exam is intact.   Lab Results: Lab Results  Component Value Date   WBC 8.8 01/30/2018   HGB 11.1 (L) 01/30/2018   HCT 33.4 (L) 01/30/2018   MCV 99.8 (H) 01/30/2018   PLT 376 01/30/2018     Chemistry      Component Value Date/Time   NA 135 (L) 01/30/2018 0843   NA 141 05/23/2017 0828   K 3.6 01/30/2018 0843   K 4.0 05/23/2017 0828   CL 98 01/30/2018 0843   CO2 27 01/30/2018 0843   CO2 27 05/23/2017 0828   BUN 12 01/30/2018 0843   BUN 12.5 05/23/2017 0828   CREATININE 0.74 01/30/2018 0843   CREATININE 0.8 05/23/2017 0828      Component Value Date/Time   CALCIUM 9.3 01/30/2018 0843   CALCIUM 9.0 05/23/2017 0828   ALKPHOS 58 01/30/2018 0843   ALKPHOS 57 05/23/2017 0828   AST 49 (H) 01/30/2018 0843   AST 13 05/23/2017 0828   ALT 59 (H) 01/30/2018 0843   ALT 15 05/23/2017 0828   BILITOT 0.3 01/30/2018 0843   BILITOT 0.37 05/23/2017 0828      Impression and Plan:  77 year old man with:   1.  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  This was confirmed in April 2019 after he is initial diagnosis in August 2018.   He is currently receiving carboplatin and gemcitabine chemotherapy without any major complications.  He received day 1 of cycle 1 without major issues.  Risks and benefits of continuing chemotherapy was reviewed today and is agreeable to continue.  His counts including CBC and  chemistries were personally reviewed and he is ready to proceed.  He will have the start of cycle 2 in 2 weeks.  .    2. Hip pain: He has documented metastatic disease to the ischial tuberosity.  His pain has improved after completing radiation therapy and the start of chemotherapy.  He has been using both oxycodone and Dilaudid as needed.  His use of Dilaudid has decreased for severe pain.  I have instructed him to use oxycodone 5 to 15 mg every 4 hours as needed and use Dilaudid for severe pain.   3.  Constipation: Improved at this time as he is decreasing his narcotic pain medication use.  4. Renal function: His creatinine is unchanged and baseline kidney function remains normal.  5.  Goals of care: Treatment is palliative and his performance status remains excellent and aggressive therapy is warranted.  6.  IV access: Port-A-Cath will be inserted on 02/07/2018.  7.  Antiemetics: No nausea or vomiting reported.  Compazine is available to  him as needed.  8.  Rash: Unclear etiology could be related to gemcitabine or carboplatin.  The plan is to premedicate him with Pepcid and Benadryl and I prescribed him a prednisone taper.  He will have 60 mg daily and tapered over the next 6 days.  9.  Urinary tract infection resolved at this time after use of oral antibiotics.  10. Follow-up: In 2 weeks first to start of cycle 2 of chemotherapy.  30  minutes was spent with the patient face-to-face today.  More than 50% of time was dedicated to patient counseling, education and discussing future plan of care as well as managing his current complaints.  Zola Button, MD 6/24/20199:01 AM

## 2018-02-06 NOTE — Progress Notes (Signed)
Per Dr. Alen Blew okay to treat with AST and ALT results today.

## 2018-02-06 NOTE — Patient Instructions (Signed)
[facility] Discharge Instructions for Patients Receiving Chemotherapy  Today you received the following chemotherapy agents Gemzar.  To help prevent nausea and vomiting after your treatment, we encourage you to take your nausea medication as directed  If you develop nausea and vomiting that is not controlled by your nausea medication, call the clinic.   BELOW ARE SYMPTOMS THAT SHOULD BE REPORTED IMMEDIATELY:  *FEVER GREATER THAN 100.5 F  *CHILLS WITH OR WITHOUT FEVER  NAUSEA AND VOMITING THAT IS NOT CONTROLLED WITH YOUR NAUSEA MEDICATION  *UNUSUAL SHORTNESS OF BREATH  *UNUSUAL BRUISING OR BLEEDING  TENDERNESS IN MOUTH AND THROAT WITH OR WITHOUT PRESENCE OF ULCERS  *URINARY PROBLEMS  *BOWEL PROBLEMS  UNUSUAL RASH Items with * indicate a potential emergency and should be followed up as soon as possible.  Feel free to call the clinic should you have any questions or concerns. The clinic phone number is [phone].  Please show the [facility] at check-in to the Emergency Department and triage nurse.   Gemcitabine injection What is this medicine? GEMCITABINE (jem SIT a been) is a chemotherapy drug. This medicine is used to treat many types of cancer like breast cancer, lung cancer, pancreatic cancer, and ovarian cancer. This medicine may be used for other purposes; ask your health care provider or pharmacist if you have questions. COMMON BRAND NAME(S): Gemzar What should I tell my health care provider before I take this medicine? They need to know if you have any of these conditions: -blood disorders -infection -kidney disease -liver disease -recent or ongoing radiation therapy -an unusual or allergic reaction to gemcitabine, other chemotherapy, other medicines, foods, dyes, or preservatives -pregnant or trying to get pregnant -breast-feeding How should I use this medicine? This drug is given as an infusion into a vein. It is administered in a  hospital or clinic by a specially trained health care professional. Talk to your pediatrician regarding the use of this medicine in children. Special care may be needed. Overdosage: If you think you have taken too much of this medicine contact a poison control center or emergency room at once. NOTE: This medicine is only for you. Do not share this medicine with others. What if I miss a dose? It is important not to miss your dose. Call your doctor or health care professional if you are unable to keep an appointment. What may interact with this medicine? -medicines to increase blood counts like filgrastim, pegfilgrastim, sargramostim -some other chemotherapy drugs like cisplatin -vaccines Talk to your doctor or health care professional before taking any of these medicines: -acetaminophen -aspirin -ibuprofen -ketoprofen -naproxen This list may not describe all possible interactions. Give your health care provider a list of all the medicines, herbs, non-prescription drugs, or dietary supplements you use. Also tell them if you smoke, drink alcohol, or use illegal drugs. Some items may interact with your medicine. What should I watch for while using this medicine? Visit your doctor for checks on your progress. This drug may make you feel generally unwell. This is not uncommon, as chemotherapy can affect healthy cells as well as cancer cells. Report any side effects. Continue your course of treatment even though you feel ill unless your doctor tells you to stop. In some cases, you may be given additional medicines to help with side effects. Follow all directions for their use. Call your doctor or health care professional for advice if you get a fever, chills or sore throat, or other symptoms of a cold or  flu. Do not treat yourself. This drug decreases your body's ability to fight infections. Try to avoid being around people who are sick. This medicine may increase your risk to bruise or bleed. Call  your doctor or health care professional if you notice any unusual bleeding. Be careful brushing and flossing your teeth or using a toothpick because you may get an infection or bleed more easily. If you have any dental work done, tell your dentist you are receiving this medicine. Avoid taking products that contain aspirin, acetaminophen, ibuprofen, naproxen, or ketoprofen unless instructed by your doctor. These medicines may hide a fever. Women should inform their doctor if they wish to become pregnant or think they might be pregnant. There is a potential for serious side effects to an unborn child. Talk to your health care professional or pharmacist for more information. Do not breast-feed an infant while taking this medicine. What side effects may I notice from receiving this medicine? Side effects that you should report to your doctor or health care professional as soon as possible: -allergic reactions like skin rash, itching or hives, swelling of the face, lips, or tongue -low blood counts - this medicine may decrease the number of white blood cells, red blood cells and platelets. You may be at increased risk for infections and bleeding. -signs of infection - fever or chills, cough, sore throat, pain or difficulty passing urine -signs of decreased platelets or bleeding - bruising, pinpoint red spots on the skin, black, tarry stools, blood in the urine -signs of decreased red blood cells - unusually weak or tired, fainting spells, lightheadedness -breathing problems -chest pain -mouth sores -nausea and vomiting -pain, swelling, redness at site where injected -pain, tingling, numbness in the hands or feet -stomach pain -swelling of ankles, feet, hands -unusual bleeding Side effects that usually do not require medical attention (report to your doctor or health care professional if they continue or are bothersome): -constipation -diarrhea -hair loss -loss of appetite -stomach upset This  list may not describe all possible side effects. Call your doctor for medical advice about side effects. You may report side effects to FDA at 1-800-FDA-1088. Where should I keep my medicine? This drug is given in a hospital or clinic and will not be stored at home. NOTE: This sheet is a summary. It may not cover all possible information. If you have questions about this medicine, talk to your doctor, pharmacist, or health care provider.  2018 Elsevier/Gold Standard (2007-12-12 18:45:54)   Carboplatin injection What is this medicine? CARBOPLATIN (KAR boe pla tin) is a chemotherapy drug. It targets fast dividing cells, like cancer cells, and causes these cells to die. This medicine is used to treat ovarian cancer and many other cancers. This medicine may be used for other purposes; ask your health care provider or pharmacist if you have questions. COMMON BRAND NAME(S): Paraplatin What should I tell my health care provider before I take this medicine? They need to know if you have any of these conditions: -blood disorders -hearing problems -kidney disease -recent or ongoing radiation therapy -an unusual or allergic reaction to carboplatin, cisplatin, other chemotherapy, other medicines, foods, dyes, or preservatives -pregnant or trying to get pregnant -breast-feeding How should I use this medicine? This drug is usually given as an infusion into a vein. It is administered in a hospital or clinic by a specially trained health care professional. Talk to your pediatrician regarding the use of this medicine in children. Special care may be needed. Overdosage: If  you think you have taken too much of this medicine contact a poison control center or emergency room at once. NOTE: This medicine is only for you. Do not share this medicine with others. What if I miss a dose? It is important not to miss a dose. Call your doctor or health care professional if you are unable to keep an appointment. What  may interact with this medicine? -medicines for seizures -medicines to increase blood counts like filgrastim, pegfilgrastim, sargramostim -some antibiotics like amikacin, gentamicin, neomycin, streptomycin, tobramycin -vaccines Talk to your doctor or health care professional before taking any of these medicines: -acetaminophen -aspirin -ibuprofen -ketoprofen -naproxen This list may not describe all possible interactions. Give your health care provider a list of all the medicines, herbs, non-prescription drugs, or dietary supplements you use. Also tell them if you smoke, drink alcohol, or use illegal drugs. Some items may interact with your medicine. What should I watch for while using this medicine? Your condition will be monitored carefully while you are receiving this medicine. You will need important blood work done while you are taking this medicine. This drug may make you feel generally unwell. This is not uncommon, as chemotherapy can affect healthy cells as well as cancer cells. Report any side effects. Continue your course of treatment even though you feel ill unless your doctor tells you to stop. In some cases, you may be given additional medicines to help with side effects. Follow all directions for their use. Call your doctor or health care professional for advice if you get a fever, chills or sore throat, or other symptoms of a cold or flu. Do not treat yourself. This drug decreases your body's ability to fight infections. Try to avoid being around people who are sick. This medicine may increase your risk to bruise or bleed. Call your doctor or health care professional if you notice any unusual bleeding. Be careful brushing and flossing your teeth or using a toothpick because you may get an infection or bleed more easily. If you have any dental work done, tell your dentist you are receiving this medicine. Avoid taking products that contain aspirin, acetaminophen, ibuprofen, naproxen,  or ketoprofen unless instructed by your doctor. These medicines may hide a fever. Do not become pregnant while taking this medicine. Women should inform their doctor if they wish to become pregnant or think they might be pregnant. There is a potential for serious side effects to an unborn child. Talk to your health care professional or pharmacist for more information. Do not breast-feed an infant while taking this medicine. What side effects may I notice from receiving this medicine? Side effects that you should report to your doctor or health care professional as soon as possible: -allergic reactions like skin rash, itching or hives, swelling of the face, lips, or tongue -signs of infection - fever or chills, cough, sore throat, pain or difficulty passing urine -signs of decreased platelets or bleeding - bruising, pinpoint red spots on the skin, black, tarry stools, nosebleeds -signs of decreased red blood cells - unusually weak or tired, fainting spells, lightheadedness -breathing problems -changes in hearing -changes in vision -chest pain -high blood pressure -low blood counts - This drug may decrease the number of white blood cells, red blood cells and platelets. You may be at increased risk for infections and bleeding. -nausea and vomiting -pain, swelling, redness or irritation at the injection site -pain, tingling, numbness in the hands or feet -problems with balance, talking, walking -trouble passing urine  or change in the amount of urine Side effects that usually do not require medical attention (report to your doctor or health care professional if they continue or are bothersome): -hair loss -loss of appetite -metallic taste in the mouth or changes in taste This list may not describe all possible side effects. Call your doctor for medical advice about side effects. You may report side effects to FDA at 1-800-FDA-1088. Where should I keep my medicine? This drug is given in a hospital  or clinic and will not be stored at home. NOTE: This sheet is a summary. It may not cover all possible information. If you have questions about this medicine, talk to your doctor, pharmacist, or health care provider.  2018 Elsevier/Gold Standard (2007-11-07 14:38:05).

## 2018-02-06 NOTE — Telephone Encounter (Signed)
Appointments scheduled AVS/Calendar printed per 6/24 los °

## 2018-02-07 ENCOUNTER — Encounter (HOSPITAL_COMMUNITY): Payer: Self-pay

## 2018-02-07 ENCOUNTER — Ambulatory Visit (HOSPITAL_COMMUNITY)
Admission: RE | Admit: 2018-02-07 | Discharge: 2018-02-07 | Disposition: A | Discharge status: Home / Self Care | Payer: Medicare Other | Source: Ambulatory Visit | Attending: Oncology | Admitting: Oncology

## 2018-02-07 ENCOUNTER — Other Ambulatory Visit: Payer: Self-pay | Admitting: Oncology

## 2018-02-07 DIAGNOSIS — R351 Nocturia: Secondary | ICD-10-CM | POA: Diagnosis not present

## 2018-02-07 DIAGNOSIS — C679 Malignant neoplasm of bladder, unspecified: Secondary | ICD-10-CM | POA: Diagnosis not present

## 2018-02-07 DIAGNOSIS — Z5111 Encounter for antineoplastic chemotherapy: Secondary | ICD-10-CM | POA: Diagnosis not present

## 2018-02-07 DIAGNOSIS — N401 Enlarged prostate with lower urinary tract symptoms: Secondary | ICD-10-CM | POA: Diagnosis not present

## 2018-02-07 DIAGNOSIS — K219 Gastro-esophageal reflux disease without esophagitis: Secondary | ICD-10-CM | POA: Diagnosis not present

## 2018-02-07 HISTORY — PX: IR IMAGING GUIDED PORT INSERTION: IMG5740

## 2018-02-07 LAB — CBC WITH DIFFERENTIAL/PLATELET
Basophils Absolute: 0 10*3/uL (ref 0.0–0.1)
Basophils Relative: 0 %
Eosinophils Absolute: 0 10*3/uL (ref 0.0–0.7)
Eosinophils Relative: 0 %
HCT: 33.8 % — ABNORMAL LOW (ref 39.0–52.0)
Hemoglobin: 11.2 g/dL — ABNORMAL LOW (ref 13.0–17.0)
Lymphocytes Relative: 2 %
Lymphs Abs: 0.2 10*3/uL — ABNORMAL LOW (ref 0.7–4.0)
MCH: 31.9 pg (ref 26.0–34.0)
MCHC: 33.1 g/dL (ref 30.0–36.0)
MCV: 96.3 fL (ref 78.0–100.0)
Monocytes Absolute: 0.4 10*3/uL (ref 0.1–1.0)
Monocytes Relative: 3 %
Neutro Abs: 10.2 10*3/uL — ABNORMAL HIGH (ref 1.7–7.7)
Neutrophils Relative %: 95 %
Platelets: 179 10*3/uL (ref 150–400)
RBC: 3.51 MIL/uL — ABNORMAL LOW (ref 4.22–5.81)
RDW: 12.8 % (ref 11.5–15.5)
WBC: 10.8 10*3/uL — ABNORMAL HIGH (ref 4.0–10.5)

## 2018-02-07 LAB — PROTIME-INR
INR: 1.12
Prothrombin Time: 14.3 seconds (ref 11.4–15.2)

## 2018-02-07 MED ORDER — FENTANYL CITRATE (PF) 100 MCG/2ML IJ SOLN
INTRAMUSCULAR | Status: AC | PRN
  Administered 2018-02-07 (×3): 50 ug via INTRAVENOUS

## 2018-02-07 MED ORDER — LIDOCAINE-EPINEPHRINE (PF) 2 %-1:200000 IJ SOLN
INTRAMUSCULAR | Status: AC
  Filled 2018-02-07: qty 20

## 2018-02-07 MED ORDER — CEFAZOLIN SODIUM-DEXTROSE 2-4 GM/100ML-% IV SOLN
INTRAVENOUS | Status: AC
  Filled 2018-02-07: qty 100

## 2018-02-07 MED ORDER — FLUMAZENIL 0.5 MG/5ML IV SOLN
INTRAVENOUS | Status: AC
  Filled 2018-02-07: qty 5

## 2018-02-07 MED ORDER — SODIUM CHLORIDE 0.9 % IV SOLN
INTRAVENOUS | Status: DC
  Administered 2018-02-07: 11:00:00 via INTRAVENOUS

## 2018-02-07 MED ORDER — MIDAZOLAM HCL 2 MG/2ML IJ SOLN
INTRAMUSCULAR | Status: AC
  Filled 2018-02-07: qty 4

## 2018-02-07 MED ORDER — HEPARIN SOD (PORK) LOCK FLUSH 100 UNIT/ML IV SOLN
INTRAVENOUS | Status: AC
Start: 2018-02-07 — End: 2018-02-08
  Filled 2018-02-07: qty 5

## 2018-02-07 MED ORDER — MIDAZOLAM HCL 2 MG/2ML IJ SOLN
INTRAMUSCULAR | Status: AC | PRN
  Administered 2018-02-07 (×3): 1 mg via INTRAVENOUS

## 2018-02-07 MED ORDER — FENTANYL CITRATE (PF) 100 MCG/2ML IJ SOLN
INTRAMUSCULAR | Status: AC
  Filled 2018-02-07: qty 4

## 2018-02-07 MED ORDER — NALOXONE HCL 0.4 MG/ML IJ SOLN
INTRAMUSCULAR | Status: AC
  Filled 2018-02-07: qty 1

## 2018-02-07 MED ORDER — CEFAZOLIN SODIUM-DEXTROSE 2-4 GM/100ML-% IV SOLN
2.0000 g | INTRAVENOUS | Status: AC
  Administered 2018-02-07: 2 g via INTRAVENOUS

## 2018-02-07 NOTE — Discharge Instructions (Signed)
Implanted Port Insertion, Care After This sheet gives you information about how to care for yourself after your procedure. Your health care provider may also give you more specific instructions. If you have problems or questions, contact your health care provider. What can I expect after the procedure? After your procedure, it is common to have:  Discomfort at the port insertion site.  Bruising on the skin over the port. This should improve over 3-4 days.  Follow these instructions at home: [facility] care  After your port is placed, you will get a manufacturer's information card. The card has information about your port. Keep this card with you at all times.  Take care of the port as told by your health care provider. Ask your health care provider if you or a family member can get training for taking care of the port at home. A home health care nurse may also take care of the port.  Make sure to remember what type of port you have. Incision care  Follow instructions from your health care provider about how to take care of your port insertion site. Make sure you: ? Wash your hands with soap and water before you change your bandage (dressing). If soap and water are not available, use hand sanitizer. ? Change your dressing as told by your health care provider. YOU MAY REMOVE DSG TOMORROW. ? Leave stitches (sutures), skin glue, or adhesive strips in place. These skin closures may need to stay in place for 2 weeks or longer. If adhesive strip edges start to loosen and curl up, you may trim the loose edges. Do not remove adhesive strips completely unless your health care provider tells you to do that.  DO NOT USE EMLA CREAM FOR 2 WEEKS AFTER PORT PLACEMENT AS THIS WILL REMOVE SURGICAL GLUE ON THE INCISION.  Check your port insertion site every day for signs of infection. Check for: ? More redness, swelling, or pain. ? More fluid or blood. ? Warmth. ? Pus or a bad smell. General instructions  Do  not take baths, swim, or use a hot tub until your health care provider approves. YOU MAY SHOWER TOMORROW.   Do not lift anything that is heavier than 10 lb (4.5 kg) for a week, or as told by your health care provider.  Ask your health care provider when it is okay to: ? Return to work or school. ? Resume usual physical activities or sports.  Do not drive for 24 hours if you were given a medicine to help you relax (sedative).  Take over-the-counter and prescription medicines only as told by your health care provider.  Wear a medical alert bracelet in case of an emergency. This will tell any health care providers that you have a port.  Keep all follow-up visits as told by your health care provider. This is important. Contact a health care provider if:    You have a fever or chills.  You have more redness, swelling, or pain around your port insertion site.  You have more fluid or blood coming from your port insertion site.  Your port insertion site feels warm to the touch.  You have pus or a bad smell coming from the port insertion site. Get help right away if:  You have chest pain or shortness of breath.  You have bleeding from your port that you cannot control. Summary  Take care of the port as told by your health care provider.  Change your dressing as told by your  health care provider.  Keep all follow-up visits as told by your health care provider. This information is not intended to replace advice given to you by your health care provider. Make sure you discuss any questions you have with your health care provider. Document Released: 05/23/2013 Document Revised: 06/23/2016 Document Reviewed: 06/23/2016 Elsevier Interactive Patient Education  2017 [employer]. Moderate Conscious Sedation, Adult, Care After These instructions provide you with information about caring for yourself after your procedure. Your health care provider may also give you more specific  instructions. Your treatment has been planned according to current medical practices, but problems sometimes occur. Call your health care provider if you have any problems or questions after your procedure. What can I expect after the procedure? After your procedure, it is common:  To feel sleepy for several hours.  To feel clumsy and have poor balance for several hours.  To have poor judgment for several hours.  To vomit if you eat too soon.  Follow these instructions at home: For at least 24 hours after the procedure:   Do not: ? Participate in activities where you could fall or become injured. ? Drive. ? Use heavy machinery. ? Drink alcohol. ? Take sleeping pills or medicines that cause drowsiness. ? Make important decisions or sign legal documents. ? Take care of children on your own.  Rest. Eating and drinking  Follow the diet recommended by your health care provider.  If you vomit: ? Drink water, juice, or soup when you can drink without vomiting. ? Make sure you have little or no nausea before eating solid foods. General instructions  Have a responsible adult stay with you until you are awake and alert.  Take over-the-counter and prescription medicines only as told by your health care provider.  If you smoke, do not smoke without supervision.  Keep all follow-up visits as told by your health care provider. This is important. Contact a health care provider if:  You keep feeling nauseous or you keep vomiting.  You feel light-headed.  You develop a rash.  You have a fever. Get help right away if:  You have trouble breathing. This information is not intended to replace advice given to you by your health care provider. Make sure you discuss any questions you have with your health care provider. Document Released: 05/23/2013 Document Revised: 01/05/2016 Document Reviewed: 11/22/2015 Elsevier Interactive Patient Education  [employer].

## 2018-02-07 NOTE — H&P (Signed)
Referring Physician(s): [name]  Supervising Physician: Arne Cleveland  Patient Status:  [facility] OP  Chief Complaint:  "I'm getting a port a cath put in"  Subjective: Patient familiar to IR service from prior right ischial lesion biopsy on 12/08/2017.  He has a history of stage IV bladder cancer and presents again today for Port-A-Cath placement for chemotherapy.  He currently denies fever, headache, chest pain, dyspnea, cough, abdominal/back pain, nausea, vomiting or bleeding. Past Medical History:  Diagnosis Date  . Bladder cancer ([place])   . BPH (benign prostatic hypertrophy)   . Dysuria   . GERD (gastroesophageal reflux disease)   . History of adenomatous polyp of colon    tubular adenoma 06/ 2018  . History of bladder cancer 12/2014   urologist-  dr [name]--  dx High Grade TCC without stromal invasion s/p TURBT and intravesical BCG tx/  recurrent 07/2015 (pTa) TCC  repear intravesical BCG tx   . History of hiatal hernia   . History of urethral stricture   . Nocturia    Past Surgical History:  Procedure Laterality Date  . CARDIAC CATHETERIZATION  1997   normal coronary arteries  . CARDIOVASCULAR STRESS TEST  12-14-2005   Low risk perfusion study/  very small scar in the inferior septum from mid ventricle to apex,  no ischemia/  mild inferior septal hypokinesis/  ef 58%  . CATARACT EXTRACTION W/ INTRAOCULAR LENS  IMPLANT, BILATERAL  2011  . COLONOSCOPY  last one 06/ 2018  . CYSTOSCOPY WITH BIOPSY N/A 08/12/2015   Procedure: CYSTOSCOPY WITH BIOPSY AND FULGERATION;  Surgeon: [name], MD;  [facility]: [facility];  Service: Urology;  Laterality: N/A;  . CYSTOSCOPY WITH INJECTION N/A 06/29/2017   Procedure: CYSTOSCOPY WITH INJECTION OF INDOCYANINE GREEN DYE;  Surgeon: Alexis Frock, MD;  Location: [facility];  Service: Urology;  Laterality: N/A;  . CYSTOSCOPY WITH URETHRAL DILATATION N/A 03/21/2017   Procedure: CYSTOSCOPY;  Surgeon: [name], MD;  Location: [facility];  Service: Urology;  Laterality: N/A;  . ESOPHAGOGASTRODUODENOSCOPY  12-05-2014  . TRANSTHORACIC ECHOCARDIOGRAM  01-31-2008   nomral LVF,  ef 55-65%/  mild pulmonic stenosis without regurg.,  peak transpulomonic valve gradient 41mmHg  . TRANSURETHRAL RESECTION OF BLADDER TUMOR N/A 12/31/2014   Procedure: TRANSURETHRAL RESECTION OF BLADDER TUMOR (TURBT);  Surgeon: Lowella Bandy, MD;  Location: [facility];  Service: Urology;  Laterality: N/A;  . TRANSURETHRAL RESECTION OF BLADDER TUMOR N/A 03/21/2017   Procedure: POSSIBLE TRANSURETHRAL RESECTION OF BLADDER TUMOR (TURBT);  Surgeon: [name], MD;  Location: [facility];  Service: Urology;  Laterality: N/A;     Allergies: Ciprofloxacin  Medications: Prior to Admission medications   Medication Sig Start Date End Date Taking? Authorizing Provider  allopurinol (ZYLOPRIM) 100 MG tablet Take 100 mg by mouth daily. 01/19/18  Yes [provider]  HYDROmorphone (DILAUDID) 4 MG tablet Take 1 tablet (4 mg total) by mouth every 4 (four) hours as needed for severe pain. 02/06/18  Yes [name], MD  hydrOXYzine (ATARAX/VISTARIL) 50 MG tablet Take 1 tablet (50 mg total) by mouth 4 (four) times daily as needed. 02/03/18  Yes [name], MD  oxyCODONE (OXY IR/ROXICODONE) 5 MG immediate release tablet Take 1-3 tablets (5-15 mg total) by mouth every 4 (four) hours as needed for severe pain. 01/30/18  Yes Hayden Pedro, PA-C  predniSONE (STERAPRED UNI-PAK 21 TAB) 10 MG (21) TBPK tablet Take 6 tablets  for a day.  Take 5 tablets for a day, then Take 4 tablets for a day Take 3 tablets for a day Take 2 tablets for a day Take one tablet for day and stop. 02/06/18  Yes [name], MD  prochlorperazine (COMPAZINE) 10 MG tablet Take 1 tablet (10 mg total) by mouth every 6 (six) hours as needed for nausea or vomiting. 01/18/18  Yes [name], MD    lidocaine-prilocaine (EMLA) cream Apply 1 application topically as needed. 01/18/18   [name], MD     Vital Signs: Ht 5\' 11"  (1.803 m)   Wt 189 lb (85.7 kg)   BMI 26.36 kg/m  Blood pressure 150/82, heart rate 87, respirations 18, O2 sat 100% room air, temperature 97.8  Physical Exam awake, alert.  Chest clear to auscultation bilaterally.  Heart with regular rate and rhythm.  Abdomen soft, positive bowel sounds, nontender, right lower quadrant urostomy intact.  No significant lower extremity edema.  Imaging: No results found.  Labs:  CBC: Recent Labs    01/28/18 0603 01/30/18 0843 02/06/18 0838 02/07/18 1008  WBC 6.4 8.8 6.0 10.8*  HGB 10.0* 11.1* 10.9* 11.2*  HCT 30.5* 33.4* 33.7* 33.8*  PLT 281 376 215 179    COAGS: Recent Labs    12/08/17 0542 02/07/18 1008  INR 0.95 1.12    BMP: Recent Labs    01/27/18 0449 01/28/18 0603 01/30/18 0843 02/06/18 0838  NA 136 135 135* 133*  K 3.7 3.7 3.6 3.9  CL 99* 98* 98 95*  CO2 29 TEST NOT PERFORMED, REAGENT NOT AVAILABLE 27 29  GLUCOSE 109* 96 150* 132  BUN 11 10 12 14   CALCIUM 8.3* 8.4* 9.3 9.6  CREATININE 0.77 0.73 0.74 0.77  GFRNONAA >60 >60 >60 >60  GFRAA >60 >60 >60 >60    LIVER FUNCTION TESTS: Recent Labs    01/25/18 1654 01/28/18 0603 01/30/18 0843 02/06/18 0838  BILITOT 0.8 0.1* 0.3 0.4  AST 40 50* 49* 95*  ALT 47 42 59* 156*  ALKPHOS 69 48 58 61  PROT 6.5 5.2* 5.8* 6.4  ALBUMIN 2.5* 2.1* 2.2* 2.4*    Assessment and Plan: Patient with history of stage IV bladder cancer; presents today for Port-A-Cath placement for chemotherapy.Risks and benefits of image guided port-a-catheter placement was discussed with the patient/family including, but not limited to bleeding, infection, pneumothorax, or fibrin sheath development and need for additional procedures.  All of the patient's questions were answered, patient is agreeable to proceed. Consent signed and in chart.     Electronically  Signed: D. Rowe Robert, PA-C 02/07/2018, 11:46 AM   I spent a total of 25 minutes at the the patient's bedside AND on the patient's hospital floor or unit, greater than 50% of which was counseling/coordinating care for Port-A-Cath placement

## 2018-02-07 NOTE — Procedures (Signed)
  Procedure: R IJ Port    EBL:   minimal Complications:  none immediate  See full dictation in [facility] PACS.  D. Lorenzo Arscott MD Main # [phone] Pager  [phone]    

## 2018-02-13 ENCOUNTER — Telehealth: Payer: Self-pay | Admitting: *Deleted

## 2018-02-13 NOTE — Telephone Encounter (Signed)
[place] left a voicemail for Lattie Haw that Mr. Mell does not need to keep his one month follow up appointment for 02-24-18 with Freeman Caldron, PA as along  he is doing well, it is perfectly fine for him to continue follow up with Dr. Alen Blew only.

## 2018-02-17 DIAGNOSIS — Z5111 Encounter for antineoplastic chemotherapy: Secondary | ICD-10-CM | POA: Insufficient documentation

## 2018-02-17 NOTE — Progress Notes (Signed)
Hematology and Oncology Follow Up Visit  [name] [id number] 1941-02-13 77 y.o. 02/20/2018 11:14 AM [name], MDFusco, Purcell Nails, MD   Principle Diagnosis: 77 year-old man with stage IV bladder cancer with a documented disease to the pelvic bone in April 2019.  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  His tumor is PDL 1 positive  with CPS score over 90%.  Prior Therapy:   He is status post neoadjuvant chemotherapy utilizing gemcitabine and cisplatin started in September 2018.  He received day 1 of cycle 1 on 05/02/2017 and therapy discontinued after that.  He is status post laparoscopic Cystoprostatectomy and bilateral lymphadenectomy done by Dr. Tresa Moore on 06/29/2017.  A final pathology showed a T3bN0 disease.  He has 13 lymph nodes sampled without any evidence of malignancy.  He completed radiation therapy to the pelvis on January 16, 2018 under the care of Dr. Tammi Klippel.  Current therapy: Carboplatin and gemcitabine started on 01/30/2018.  Today is day 1 of cycle 2.  Interim History: [name] is here for a follow-up.  Patient tolerated his first cycle of chemotherapy fairly well.  He complained of having approximate 7 to 8 days of fatigue and weakness.  He has ongoing pain to his right hip/tailbone.  He uses Dilaudid 1 tablet every 8 hours along with oxycodone 2 tablets every 8 hours.  He is requesting a refill on both of these medications today.  He reports that his rash is somewhat better since receiving a Medrol Dosepak.  He still reports itching to his bilateral flanks.  He did not get much relief with Benadryl or Atarax.  His appetite has improved and he has gained back some of his lost weight.  Reports constipation and is using Colace and MiraLAX which is helping.  He does not report any headaches, blurry vision, syncope or seizures.  He denies any neuropathy or confusion.  He does not report any fevers, chills or sweats. He does not  report any cough, wheezing or dyspnea on exertion. He does not report any chest pain, palpitations, orthopnea or leg edema. He does not report any nausea, vomiting, abdominal pain,diarrhea.  He does not report any frequency urgency or hesitancy.  He denies any ecchymosis.  Denies any thrombosis or bleeding tendencies.  Denies any mood changes.  Denies any heat or cold intolerance.  Remaining review of systems is negative.  The patient is here for evaluation prior to day 1 of cycle 2 of his chemotherapy.  Medications: I have reviewed the patient's current medications.  Current Outpatient Medications  Medication Sig Dispense Refill  . allopurinol (ZYLOPRIM) 100 MG tablet Take 100 mg by mouth daily.    Marland Kitchen HYDROmorphone (DILAUDID) 4 MG tablet Take 1 tablet (4 mg total) by mouth every 4 (four) hours as needed for severe pain. 30 tablet 0  . oxyCODONE (OXY IR/ROXICODONE) 5 MG immediate release tablet Take 1-2 tablets (5-10 mg total) by mouth every 4 (four) hours as needed for severe pain. 60 tablet 0  . prochlorperazine (COMPAZINE) 10 MG tablet Take 1 tablet (10 mg total) by mouth every 6 (six) hours as needed for nausea or vomiting. 30 tablet 0  . hydrOXYzine (ATARAX/VISTARIL) 50 MG tablet Take 1 tablet (50 mg total) by mouth 4 (four) times daily as needed. (Patient not taking: Reported on 02/20/2018) 30 tablet 0  . lidocaine-prilocaine (EMLA) cream Apply 1 application topically as needed. (Patient not taking: Reported on 02/20/2018) 30 g 0  .  predniSONE (STERAPRED UNI-PAK 21 TAB) 10 MG (21) TBPK tablet Take 6 tablets for a day.  Take 5 tablets for a day, then Take 4 tablets for a day Take 3 tablets for a day Take 2 tablets for a day Take one tablet for day and stop. (Patient not taking: Reported on 02/20/2018) 21 tablet 0   No current facility-administered medications for this visit.    Facility-Administered Medications Ordered in Other Visits  Medication Dose Route Frequency Provider Last Rate Last Dose   . CARBOplatin (PARAPLATIN) 400 mg in sodium chloride 0.9 % 250 mL chemo infusion  400 mg Intravenous Once Shadad, Firas N, MD      . gemcitabine (GEMZAR) 2,128 mg in sodium chloride 0.9 % 250 mL chemo infusion  1,000 mg/m2 (Treatment Plan Recorded) Intravenous Once [name], MD      . heparin lock flush 100 unit/mL  500 Units Intracatheter Once PRN [name], MD      . sodium chloride flush (NS) 0.9 % injection 10 mL  10 mL Intracatheter PRN [name], MD         Allergies:  Allergies  Allergen Reactions  . Ciprofloxacin Hives    Past Medical History, Surgical history, Social history, and Family History were reviewed and updated.  Physical Exam:  Blood pressure 133/69, pulse 97, temperature 98.2 F (36.8 C), temperature source Oral, resp. rate 17, height 5\' 11"  (1.803 m), weight 192 lb 6.4 oz (87.3 kg), SpO2 97 %.  ECOG: 1 General appearance: Well-appearing gentleman without distress. Head: Normocephalic without abnormalities. Oropharynx: Mucous membranes are moist and pink. Eyes: Sclera anicteric. Lymph nodes: No no lymphadenopathy noted in the cervical, supraclavicular, inguinal or axillary regions. Heart: Regular rate and rhythm.  S1 and S2 without murmurs. Lung: clear without any rhonchi, wheezes or dullness to percussion. Abdomin: Soft, without any rebound or guarding.  No shifting dullness or ascites. Musculoskeletal: No joint deformity or effusion.  Full range of motion noted. Skin: Diffuse faint erythema noted on bilateral flanks.  No petechiae or ecchymosis noted. Neurological: Motor and sensory exam is intact.   Lab Results: Lab Results  Component Value Date   WBC 6.9 02/20/2018   HGB 9.3 (L) 02/20/2018   HCT 27.8 (L) 02/20/2018   MCV 96.2 02/20/2018   PLT 444 (H) 02/20/2018     Chemistry      Component Value Date/Time   NA 130 (L) 02/20/2018 0818   NA 141 05/23/2017 0828   K 4.5 02/20/2018 0818   K 4.0 05/23/2017 0828   CL 92 (L)  02/20/2018 0818   CO2 31 02/20/2018 0818   CO2 27 05/23/2017 0828   BUN 12 02/20/2018 0818   BUN 12.5 05/23/2017 0828   CREATININE 0.71 02/20/2018 0818   CREATININE 0.8 05/23/2017 0828      Component Value Date/Time   CALCIUM 9.2 02/20/2018 0818   CALCIUM 9.0 05/23/2017 0828   ALKPHOS 60 02/20/2018 0818   ALKPHOS 57 05/23/2017 0828   AST 30 02/20/2018 0818   AST 13 05/23/2017 0828   ALT 80 (H) 02/20/2018 0818   ALT 15 05/23/2017 0828   BILITOT 0.5 02/20/2018 0818   BILITOT 0.37 05/23/2017 0828      Impression and Plan:  77 year old man with:   1.  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  This was confirmed in April 2019 after he is initial diagnosis in August 2018.  He is currently receiving carboplatin and gemcitabine chemotherapy without any major complications.  He received his first cycle without any concerning complaints except for itching and mild rash.  Risks and benefits of continuing chemotherapy was reviewed today and is agreeable to continue.  Labs been reviewed and are adequate to proceed.  Recommend that he proceed with day 1 of cycle 2 as scheduled today.  2. Hip pain: He has documented metastatic disease to the ischial tuberosity.  His pain has improved after completing radiation therapy and the start of chemotherapy.  He has been using both oxycodone and Dilaudid as needed.  He received a refill of both of his pain medications today.  3.  Constipation: Improved at this time as he is decreasing his narcotic pain medication use.  Recommend that he continue Colace twice a day along with MiraLAX 1 capful mixed in water or juice daily.  4. Renal function: His creatinine is unchanged and baseline kidney function remains normal.  5.  Goals of care: Treatment is palliative and his performance status remains excellent and aggressive therapy is warranted.  6.  IV access: Port-A-Cath in place without  any complications noted.  7.  Antiemetics: No nausea or vomiting reported.  Compazine is available to him as needed.  8.  Rash: Unclear etiology could be related to gemcitabine or carboplatin.  Improved with Medrol Dosepak.  We discussed using topical Benadryl or hydrocortisone to help with the itching.  He will be premedicated with Benadryl and Pepcid prior to his chemotherapy today.  I advised him to contact us if his itching or rash worsen.  9.  Anemia: Secondary to chemotherapy.  Hemoglobin is 9.3 today.  CBC will be monitored on each day of chemotherapy.  We will consider a blood transfusion if his hemoglobin is 7.0 or less or he becomes more symptomatic.  10. Follow-up: In 3 weeks for evaluation prior to day 1 of cycle 3.   Mikey Bussing, DNP, AGPCNP-BC, AOCNP 7/8/201911:14 AM

## 2018-02-20 ENCOUNTER — Inpatient Hospital Stay: Payer: Medicare Other

## 2018-02-20 ENCOUNTER — Telehealth: Payer: Self-pay | Admitting: Nutrition

## 2018-02-20 ENCOUNTER — Ambulatory Visit: Payer: Medicare Other

## 2018-02-20 ENCOUNTER — Inpatient Hospital Stay: Payer: Medicare Other | Attending: Oncology | Admitting: *Deleted

## 2018-02-20 ENCOUNTER — Inpatient Hospital Stay (HOSPITAL_BASED_OUTPATIENT_CLINIC_OR_DEPARTMENT_OTHER): Payer: Medicare Other | Admitting: Oncology

## 2018-02-20 ENCOUNTER — Encounter: Payer: Self-pay | Admitting: Oncology

## 2018-02-20 VITALS — BP 133/69 | HR 97 | Temp 98.2°F | Resp 17 | Ht 71.0 in | Wt 192.4 lb

## 2018-02-20 DIAGNOSIS — K5900 Constipation, unspecified: Secondary | ICD-10-CM | POA: Insufficient documentation

## 2018-02-20 DIAGNOSIS — K921 Melena: Secondary | ICD-10-CM | POA: Insufficient documentation

## 2018-02-20 DIAGNOSIS — G893 Neoplasm related pain (acute) (chronic): Secondary | ICD-10-CM

## 2018-02-20 DIAGNOSIS — Z923 Personal history of irradiation: Secondary | ICD-10-CM

## 2018-02-20 DIAGNOSIS — C7800 Secondary malignant neoplasm of unspecified lung: Secondary | ICD-10-CM | POA: Insufficient documentation

## 2018-02-20 DIAGNOSIS — D6481 Anemia due to antineoplastic chemotherapy: Secondary | ICD-10-CM

## 2018-02-20 DIAGNOSIS — C7951 Secondary malignant neoplasm of bone: Secondary | ICD-10-CM

## 2018-02-20 DIAGNOSIS — R21 Rash and other nonspecific skin eruption: Secondary | ICD-10-CM

## 2018-02-20 DIAGNOSIS — C679 Malignant neoplasm of bladder, unspecified: Secondary | ICD-10-CM | POA: Insufficient documentation

## 2018-02-20 DIAGNOSIS — Z5111 Encounter for antineoplastic chemotherapy: Secondary | ICD-10-CM | POA: Insufficient documentation

## 2018-02-20 DIAGNOSIS — Z9221 Personal history of antineoplastic chemotherapy: Secondary | ICD-10-CM | POA: Insufficient documentation

## 2018-02-20 DIAGNOSIS — L299 Pruritus, unspecified: Secondary | ICD-10-CM | POA: Diagnosis not present

## 2018-02-20 DIAGNOSIS — Z95828 Presence of other vascular implants and grafts: Secondary | ICD-10-CM

## 2018-02-20 LAB — CBC WITH DIFFERENTIAL (CANCER CENTER ONLY)
Basophils Absolute: 0.1 10*3/uL (ref 0.0–0.1)
Basophils Relative: 1 %
Eosinophils Absolute: 0.1 10*3/uL (ref 0.0–0.5)
Eosinophils Relative: 1 %
HCT: 27.8 % — ABNORMAL LOW (ref 38.4–49.9)
Hemoglobin: 9.3 g/dL — ABNORMAL LOW (ref 13.0–17.1)
Lymphocytes Relative: 8 %
Lymphs Abs: 0.5 10*3/uL — ABNORMAL LOW (ref 0.9–3.3)
MCH: 32.3 pg (ref 27.2–33.4)
MCHC: 33.5 g/dL (ref 32.0–36.0)
MCV: 96.2 fL (ref 79.3–98.0)
Monocytes Absolute: 1 10*3/uL — ABNORMAL HIGH (ref 0.1–0.9)
Monocytes Relative: 14 %
Neutro Abs: 5.2 10*3/uL (ref 1.5–6.5)
Neutrophils Relative %: 76 %
Platelet Count: 444 10*3/uL — ABNORMAL HIGH (ref 140–400)
RBC: 2.89 MIL/uL — ABNORMAL LOW (ref 4.20–5.82)
RDW: 14.6 % (ref 11.0–14.6)
WBC Count: 6.9 10*3/uL (ref 4.0–10.3)

## 2018-02-20 LAB — CMP (CANCER CENTER ONLY)
ALT: 80 U/L — ABNORMAL HIGH (ref 0–44)
AST: 30 U/L (ref 15–41)
Albumin: 2.4 g/dL — ABNORMAL LOW (ref 3.5–5.0)
Alkaline Phosphatase: 60 U/L (ref 38–126)
Anion gap: 7 (ref 5–15)
BUN: 12 mg/dL (ref 8–23)
CO2: 31 mmol/L (ref 22–32)
Calcium: 9.2 mg/dL (ref 8.9–10.3)
Chloride: 92 mmol/L — ABNORMAL LOW (ref 98–111)
Creatinine: 0.71 mg/dL (ref 0.61–1.24)
GFR, Est AFR Am: >60 mL/min (ref >60)
GFR, Estimated: >60 mL/min (ref >60)
Glucose, Bld: 91 mg/dL (ref 70–99)
Potassium: 4.5 mmol/L (ref 3.5–5.1)
Sodium: 130 mmol/L — ABNORMAL LOW (ref 135–145)
Total Bilirubin: 0.5 mg/dL (ref 0.3–1.2)
Total Protein: 6.1 g/dL — ABNORMAL LOW (ref 6.5–8.1)

## 2018-02-20 MED ORDER — HEPARIN SOD (PORK) LOCK FLUSH 100 UNIT/ML IV SOLN
500.0000 [IU] | Freq: Once | INTRAVENOUS | Status: AC | PRN
  Administered 2018-02-20: 500 [IU]
  Filled 2018-02-20: qty 5

## 2018-02-20 MED ORDER — SODIUM CHLORIDE 0.9 % IV SOLN
Freq: Once | INTRAVENOUS | Status: AC
  Administered 2018-02-20: 10:00:00 via INTRAVENOUS
  Filled 2018-02-20: qty 5

## 2018-02-20 MED ORDER — FAMOTIDINE IN NACL 20-0.9 MG/50ML-% IV SOLN
INTRAVENOUS | Status: AC
  Filled 2018-02-20: qty 50

## 2018-02-20 MED ORDER — SODIUM CHLORIDE 0.9% FLUSH
10.0000 mL | Freq: Once | INTRAVENOUS | Status: AC
  Administered 2018-02-20: 10 mL
  Filled 2018-02-20: qty 10

## 2018-02-20 MED ORDER — PALONOSETRON HCL INJECTION 0.25 MG/5ML
0.2500 mg | Freq: Once | INTRAVENOUS | Status: AC
  Administered 2018-02-20: 0.25 mg via INTRAVENOUS

## 2018-02-20 MED ORDER — SODIUM CHLORIDE 0.9% FLUSH
10.0000 mL | INTRAVENOUS | Status: DC | PRN
  Administered 2018-02-20: 10 mL
  Filled 2018-02-20: qty 10

## 2018-02-20 MED ORDER — SODIUM CHLORIDE 0.9 % IV SOLN
400.0000 mg | Freq: Once | INTRAVENOUS | Status: AC
  Administered 2018-02-20: 400 mg via INTRAVENOUS
  Filled 2018-02-20: qty 40

## 2018-02-20 MED ORDER — DIPHENHYDRAMINE HCL 25 MG PO CAPS
25.0000 mg | ORAL_CAPSULE | Freq: Once | ORAL | Status: AC
  Administered 2018-02-20: 25 mg via ORAL

## 2018-02-20 MED ORDER — PALONOSETRON HCL INJECTION 0.25 MG/5ML
INTRAVENOUS | Status: AC
Start: 2018-02-20 — End: ?
  Filled 2018-02-20: qty 5

## 2018-02-20 MED ORDER — SODIUM CHLORIDE 0.9 % IV SOLN
Freq: Once | INTRAVENOUS | Status: AC
  Administered 2018-02-20: 10:00:00 via INTRAVENOUS

## 2018-02-20 MED ORDER — SODIUM CHLORIDE 0.9 % IV SOLN
1000.0000 mg/m2 | Freq: Once | INTRAVENOUS | Status: AC
  Administered 2018-02-20: 2128 mg via INTRAVENOUS
  Filled 2018-02-20: qty 55.97

## 2018-02-20 MED ORDER — OXYCODONE HCL 5 MG PO TABS: 5.0000 mg | ORAL_TABLET | ORAL | 0 refills | Status: DC | PRN

## 2018-02-20 MED ORDER — FAMOTIDINE IN NACL 20-0.9 MG/50ML-% IV SOLN
20.0000 mg | Freq: Once | INTRAVENOUS | Status: AC
  Administered 2018-02-20: 20 mg via INTRAVENOUS

## 2018-02-20 MED ORDER — DIPHENHYDRAMINE HCL 25 MG PO CAPS
ORAL_CAPSULE | ORAL | Status: AC
  Filled 2018-02-20: qty 1

## 2018-02-20 MED ORDER — HYDROMORPHONE HCL 4 MG PO TABS: 4.0000 mg | ORAL_TABLET | ORAL | 0 refills | Status: DC | PRN

## 2018-02-20 NOTE — Telephone Encounter (Signed)
Appointment scheduled LMVM for patient with date/time per 7/8 sch msg

## 2018-02-20 NOTE — Progress Notes (Signed)
Complete 02/20/18 

## 2018-02-20 NOTE — Patient Instructions (Signed)
[facility] Discharge Instructions for Patients Receiving Chemotherapy  Today you received the following chemotherapy agents: Gemcitabine (Gemzar) and Carboplatin (Paraplatin)  To help prevent nausea and vomiting after your treatment, we encourage you to take your nausea medication as prescribed. Received Aloxi during treatment today-->Take Compazine (not Zofran) for the next 3 days as needed.    If you develop nausea and vomiting that is not controlled by your nausea medication, call the clinic.   BELOW ARE SYMPTOMS THAT SHOULD BE REPORTED IMMEDIATELY:  *FEVER GREATER THAN 100.5 F  *CHILLS WITH OR WITHOUT FEVER  NAUSEA AND VOMITING THAT IS NOT CONTROLLED WITH YOUR NAUSEA MEDICATION  *UNUSUAL SHORTNESS OF BREATH  *UNUSUAL BRUISING OR BLEEDING  TENDERNESS IN MOUTH AND THROAT WITH OR WITHOUT PRESENCE OF ULCERS  *URINARY PROBLEMS  *BOWEL PROBLEMS  UNUSUAL RASH Items with * indicate a potential emergency and should be followed up as soon as possible.  Feel free to call the clinic should you have any questions or concerns. The clinic phone number is [phone].  Please show the [facility] [place] at check-in to the Emergency Department and triage nurse.

## 2018-02-20 NOTE — Progress Notes (Signed)
Will keep Carbo dose at AUC = 4 (400 mg) per Mikey Bussing, NP.  Dr. Alen Blew is on PAL today. Kennith Center, Pharm.D., CPP 02/20/2018@10 :04 AM

## 2018-02-21 ENCOUNTER — Telehealth: Payer: Self-pay | Admitting: Oncology

## 2018-02-21 ENCOUNTER — Ambulatory Visit: Payer: Self-pay | Admitting: Urology

## 2018-02-21 NOTE — Telephone Encounter (Signed)
Patient called to cancel °

## 2018-02-22 ENCOUNTER — Telehealth: Payer: Self-pay

## 2018-02-22 ENCOUNTER — Inpatient Hospital Stay: Payer: Medicare Other | Admitting: Nutrition

## 2018-02-22 NOTE — Telephone Encounter (Signed)
Received phone call from pt daughter-in-law that she had a message from her mother than pt has been having a difficult time moving bowels. Pt will sit on toilet for up to three hours to go and has to "pick stool out." Pt currently taking colace bid and miralax daily. Recommendation from Dr. Alen Blew for pt to start taking miralax daily (more if needed) along with senna s 2 tablets twice daily. To assist with immediate relief. Pt to drink 1/2 bottle of magnesium citrate. Wait one hour for good stool. If pt does not have good BM can take other half of bottle. Pt daughter-in-law verbalized understanding and is aware that all of these medications are OTC.

## 2018-02-24 ENCOUNTER — Ambulatory Visit: Payer: Medicare Other | Admitting: Urology

## 2018-02-27 ENCOUNTER — Inpatient Hospital Stay: Payer: Medicare Other

## 2018-02-27 VITALS — BP 122/75 | HR 95 | Temp 98.4°F | Resp 17

## 2018-02-27 DIAGNOSIS — K5900 Constipation, unspecified: Secondary | ICD-10-CM | POA: Diagnosis not present

## 2018-02-27 DIAGNOSIS — C679 Malignant neoplasm of bladder, unspecified: Secondary | ICD-10-CM

## 2018-02-27 DIAGNOSIS — Z5111 Encounter for antineoplastic chemotherapy: Secondary | ICD-10-CM | POA: Diagnosis not present

## 2018-02-27 DIAGNOSIS — G893 Neoplasm related pain (acute) (chronic): Secondary | ICD-10-CM | POA: Diagnosis not present

## 2018-02-27 DIAGNOSIS — C7800 Secondary malignant neoplasm of unspecified lung: Secondary | ICD-10-CM | POA: Diagnosis not present

## 2018-02-27 DIAGNOSIS — C7951 Secondary malignant neoplasm of bone: Secondary | ICD-10-CM | POA: Diagnosis not present

## 2018-02-27 DIAGNOSIS — Z95828 Presence of other vascular implants and grafts: Secondary | ICD-10-CM

## 2018-02-27 LAB — CBC WITH DIFFERENTIAL (CANCER CENTER ONLY)
Basophils Absolute: 0 10*3/uL (ref 0.0–0.1)
Basophils Relative: 0 %
Eosinophils Absolute: 0 10*3/uL (ref 0.0–0.5)
Eosinophils Relative: 0 %
HCT: 29.7 % — ABNORMAL LOW (ref 38.4–49.9)
Hemoglobin: 9.4 g/dL — ABNORMAL LOW (ref 13.0–17.1)
Lymphocytes Relative: 13 %
Lymphs Abs: 0.6 10*3/uL — ABNORMAL LOW (ref 0.9–3.3)
MCH: 30.3 pg (ref 27.2–33.4)
MCHC: 31.6 g/dL — ABNORMAL LOW (ref 32.0–36.0)
MCV: 95.8 fL (ref 79.3–98.0)
Monocytes Absolute: 0.7 10*3/uL (ref 0.1–0.9)
Monocytes Relative: 14 %
Neutro Abs: 3.3 10*3/uL (ref 1.5–6.5)
Neutrophils Relative %: 73 %
Platelet Count: 209 10*3/uL (ref 140–400)
RBC: 3.1 MIL/uL — ABNORMAL LOW (ref 4.20–5.82)
RDW: 13.9 % (ref 11.0–14.6)
WBC Count: 4.6 10*3/uL (ref 4.0–10.3)

## 2018-02-27 LAB — CMP (CANCER CENTER ONLY)
ALT: 65 U/L — ABNORMAL HIGH (ref 0–44)
AST: 22 U/L (ref 15–41)
Albumin: 2.6 g/dL — ABNORMAL LOW (ref 3.5–5.0)
Alkaline Phosphatase: 65 U/L (ref 38–126)
Anion gap: 11 (ref 5–15)
BUN: 18 mg/dL (ref 8–23)
CO2: 26 mmol/L (ref 22–32)
Calcium: 9.6 mg/dL (ref 8.9–10.3)
Chloride: 96 mmol/L — ABNORMAL LOW (ref 98–111)
Creatinine: 0.73 mg/dL (ref 0.61–1.24)
GFR, Est AFR Am: >60 mL/min (ref >60)
GFR, Estimated: >60 mL/min (ref >60)
Glucose, Bld: 141 mg/dL — ABNORMAL HIGH (ref 70–99)
Potassium: 4 mmol/L (ref 3.5–5.1)
Sodium: 133 mmol/L — ABNORMAL LOW (ref 135–145)
Total Bilirubin: 0.4 mg/dL (ref 0.3–1.2)
Total Protein: 6.3 g/dL — ABNORMAL LOW (ref 6.5–8.1)

## 2018-02-27 MED ORDER — FAMOTIDINE IN NACL 20-0.9 MG/50ML-% IV SOLN
INTRAVENOUS | Status: AC
Start: 2018-02-27 — End: ?
  Filled 2018-02-27: qty 50

## 2018-02-27 MED ORDER — SODIUM CHLORIDE 0.9% FLUSH
10.0000 mL | Freq: Once | INTRAVENOUS | Status: AC
  Administered 2018-02-27: 10 mL
  Filled 2018-02-27: qty 10

## 2018-02-27 MED ORDER — HEPARIN SOD (PORK) LOCK FLUSH 100 UNIT/ML IV SOLN
500.0000 [IU] | Freq: Once | INTRAVENOUS | Status: AC | PRN
  Administered 2018-02-27: 500 [IU]
  Filled 2018-02-27: qty 5

## 2018-02-27 MED ORDER — SODIUM CHLORIDE 0.9 % IV SOLN
1000.0000 mg/m2 | Freq: Once | INTRAVENOUS | Status: AC
  Administered 2018-02-27: 2128 mg via INTRAVENOUS
  Filled 2018-02-27: qty 55.97

## 2018-02-27 MED ORDER — FAMOTIDINE IN NACL 20-0.9 MG/50ML-% IV SOLN
20.0000 mg | Freq: Once | INTRAVENOUS | Status: AC
  Administered 2018-02-27: 20 mg via INTRAVENOUS

## 2018-02-27 MED ORDER — SODIUM CHLORIDE 0.9% FLUSH
10.0000 mL | INTRAVENOUS | Status: DC | PRN
  Administered 2018-02-27: 10 mL
  Filled 2018-02-27: qty 10

## 2018-02-27 MED ORDER — DIPHENHYDRAMINE HCL 25 MG PO CAPS
25.0000 mg | ORAL_CAPSULE | Freq: Once | ORAL | Status: AC
  Administered 2018-02-27: 25 mg via ORAL

## 2018-02-27 MED ORDER — SODIUM CHLORIDE 0.9 % IV SOLN
Freq: Once | INTRAVENOUS | Status: AC
  Administered 2018-02-27: 12:00:00 via INTRAVENOUS

## 2018-02-27 MED ORDER — PROCHLORPERAZINE MALEATE 10 MG PO TABS
10.0000 mg | ORAL_TABLET | Freq: Once | ORAL | Status: AC
  Administered 2018-02-27: 10 mg via ORAL

## 2018-02-27 MED ORDER — PROCHLORPERAZINE MALEATE 10 MG PO TABS
ORAL_TABLET | ORAL | Status: AC
  Filled 2018-02-27: qty 1

## 2018-02-27 MED ORDER — DIPHENHYDRAMINE HCL 25 MG PO CAPS
ORAL_CAPSULE | ORAL | Status: AC
  Filled 2018-02-27: qty 1

## 2018-02-27 NOTE — Patient Instructions (Signed)
[facility] °Discharge Instructions for Patients Receiving Chemotherapy ° °Today you received the following chemotherapy agents Gemzar ° °To help prevent nausea and vomiting after your treatment, we encourage you to take your nausea medication as directed. °  °If you develop nausea and vomiting that is not controlled by your nausea medication, call the clinic.  ° °BELOW ARE SYMPTOMS THAT SHOULD BE REPORTED IMMEDIATELY: °· *FEVER GREATER THAN 100.5 F °· *CHILLS WITH OR WITHOUT FEVER °· NAUSEA AND VOMITING THAT IS NOT CONTROLLED WITH YOUR NAUSEA MEDICATION °· *UNUSUAL SHORTNESS OF BREATH °· *UNUSUAL BRUISING OR BLEEDING °· TENDERNESS IN MOUTH AND THROAT WITH OR WITHOUT PRESENCE OF ULCERS °· *URINARY PROBLEMS °· *BOWEL PROBLEMS °· UNUSUAL RASH °Items with * indicate a potential emergency and should be followed up as soon as possible. ° °Feel free to call the clinic should you have any questions or concerns. The clinic phone number is [phone]. ° °Please show the CHEMO ALERT CARD at check-in to the Emergency Department and triage nurse. ° ° °

## 2018-03-02 ENCOUNTER — Telehealth: Payer: Self-pay | Admitting: *Deleted

## 2018-03-02 ENCOUNTER — Other Ambulatory Visit: Payer: Self-pay | Admitting: *Deleted

## 2018-03-02 MED ORDER — PREDNISONE 10 MG (21) PO TBPK: ORAL_TABLET | ORAL | 0 refills | Status: DC

## 2018-03-02 NOTE — Telephone Encounter (Signed)
Please refill his Prednisone dose pack.

## 2018-03-02 NOTE — Telephone Encounter (Signed)
Patient states he has a red, itchy rash on both sides of his trunk and sides. States prednisone dose pak worked last time and he is out of atarax.

## 2018-03-13 ENCOUNTER — Other Ambulatory Visit: Payer: Self-pay | Admitting: *Deleted

## 2018-03-13 ENCOUNTER — Telehealth: Payer: Self-pay

## 2018-03-13 ENCOUNTER — Inpatient Hospital Stay: Payer: Medicare Other

## 2018-03-13 ENCOUNTER — Inpatient Hospital Stay (HOSPITAL_BASED_OUTPATIENT_CLINIC_OR_DEPARTMENT_OTHER): Payer: Medicare Other | Admitting: Oncology

## 2018-03-13 VITALS — BP 124/77 | HR 108 | Temp 98.6°F | Resp 17 | Ht 71.0 in | Wt 186.7 lb

## 2018-03-13 DIAGNOSIS — K5900 Constipation, unspecified: Secondary | ICD-10-CM

## 2018-03-13 DIAGNOSIS — C679 Malignant neoplasm of bladder, unspecified: Secondary | ICD-10-CM

## 2018-03-13 DIAGNOSIS — G893 Neoplasm related pain (acute) (chronic): Secondary | ICD-10-CM | POA: Diagnosis not present

## 2018-03-13 DIAGNOSIS — K921 Melena: Secondary | ICD-10-CM | POA: Diagnosis not present

## 2018-03-13 DIAGNOSIS — Z95828 Presence of other vascular implants and grafts: Secondary | ICD-10-CM

## 2018-03-13 DIAGNOSIS — D6481 Anemia due to antineoplastic chemotherapy: Secondary | ICD-10-CM

## 2018-03-13 DIAGNOSIS — C7951 Secondary malignant neoplasm of bone: Secondary | ICD-10-CM

## 2018-03-13 DIAGNOSIS — L299 Pruritus, unspecified: Secondary | ICD-10-CM | POA: Diagnosis not present

## 2018-03-13 DIAGNOSIS — C7800 Secondary malignant neoplasm of unspecified lung: Secondary | ICD-10-CM

## 2018-03-13 DIAGNOSIS — Z5111 Encounter for antineoplastic chemotherapy: Secondary | ICD-10-CM | POA: Diagnosis not present

## 2018-03-13 LAB — CBC WITH DIFFERENTIAL (CANCER CENTER ONLY)
Basophils Absolute: 0.1 10*3/uL (ref 0.0–0.1)
Basophils Relative: 2 %
Eosinophils Absolute: 0.1 10*3/uL (ref 0.0–0.5)
Eosinophils Relative: 2 %
HCT: 25.1 % — ABNORMAL LOW (ref 38.4–49.9)
Hemoglobin: 8.1 g/dL — ABNORMAL LOW (ref 13.0–17.1)
Lymphocytes Relative: 11 %
Lymphs Abs: 0.5 10*3/uL — ABNORMAL LOW (ref 0.9–3.3)
MCH: 31.2 pg (ref 27.2–33.4)
MCHC: 32.2 g/dL (ref 32.0–36.0)
MCV: 96.9 fL (ref 79.3–98.0)
Monocytes Absolute: 0.9 10*3/uL (ref 0.1–0.9)
Monocytes Relative: 20 %
Neutro Abs: 2.8 10*3/uL (ref 1.5–6.5)
Neutrophils Relative %: 65 %
Platelet Count: 436 10*3/uL — ABNORMAL HIGH (ref 140–400)
RBC: 2.59 MIL/uL — ABNORMAL LOW (ref 4.20–5.82)
RDW: 17.2 % — ABNORMAL HIGH (ref 11.0–14.6)
WBC Count: 4.3 10*3/uL (ref 4.0–10.3)

## 2018-03-13 LAB — CMP (CANCER CENTER ONLY)
ALT: 28 U/L (ref 0–44)
AST: 13 U/L — ABNORMAL LOW (ref 15–41)
Albumin: 2.4 g/dL — ABNORMAL LOW (ref 3.5–5.0)
Alkaline Phosphatase: 56 U/L (ref 38–126)
Anion gap: 8 (ref 5–15)
BUN: 8 mg/dL (ref 8–23)
CO2: 27 mmol/L (ref 22–32)
Calcium: 9 mg/dL (ref 8.9–10.3)
Chloride: 100 mmol/L (ref 98–111)
Creatinine: 0.73 mg/dL (ref 0.61–1.24)
GFR, Est AFR Am: >60 mL/min (ref >60)
GFR, Estimated: >60 mL/min (ref >60)
Glucose, Bld: 101 mg/dL — ABNORMAL HIGH (ref 70–99)
Potassium: 4 mmol/L (ref 3.5–5.1)
Sodium: 135 mmol/L (ref 135–145)
Total Bilirubin: 0.4 mg/dL (ref 0.3–1.2)
Total Protein: 5.6 g/dL — ABNORMAL LOW (ref 6.5–8.1)

## 2018-03-13 MED ORDER — HEPARIN SOD (PORK) LOCK FLUSH 100 UNIT/ML IV SOLN
500.0000 [IU] | Freq: Once | INTRAVENOUS | Status: DC | PRN
Start: 2018-03-13 — End: 2018-03-13
  Filled 2018-03-13: qty 5

## 2018-03-13 MED ORDER — GEMCITABINE HCL CHEMO INJECTION 1 GM/26.3ML
1000.0000 mg/m2 | Freq: Once | INTRAVENOUS | Status: AC
  Administered 2018-03-13: 2128 mg via INTRAVENOUS
  Filled 2018-03-13: qty 55.97

## 2018-03-13 MED ORDER — OXYCODONE HCL 5 MG PO TABS: 5.0000 mg | ORAL_TABLET | ORAL | 0 refills | Status: DC | PRN

## 2018-03-13 MED ORDER — FOSAPREPITANT DIMEGLUMINE INJECTION 150 MG
Freq: Once | INTRAVENOUS | Status: AC
  Administered 2018-03-13: 09:00:00 via INTRAVENOUS
  Filled 2018-03-13: qty 5

## 2018-03-13 MED ORDER — HYDROMORPHONE HCL 4 MG PO TABS: 4.0000 mg | ORAL_TABLET | ORAL | 0 refills | Status: DC | PRN

## 2018-03-13 MED ORDER — DIPHENHYDRAMINE HCL 50 MG/ML IJ SOLN
INTRAMUSCULAR | Status: AC
  Filled 2018-03-13: qty 1

## 2018-03-13 MED ORDER — FAMOTIDINE IN NACL 20-0.9 MG/50ML-% IV SOLN
20.0000 mg | Freq: Once | INTRAVENOUS | Status: AC
  Administered 2018-03-13: 20 mg via INTRAVENOUS

## 2018-03-13 MED ORDER — SODIUM CHLORIDE 0.9% FLUSH
10.0000 mL | INTRAVENOUS | Status: DC | PRN
  Filled 2018-03-13: qty 10

## 2018-03-13 MED ORDER — FAMOTIDINE IN NACL 20-0.9 MG/50ML-% IV SOLN
INTRAVENOUS | Status: AC
  Filled 2018-03-13: qty 50

## 2018-03-13 MED ORDER — PALONOSETRON HCL INJECTION 0.25 MG/5ML
INTRAVENOUS | Status: AC
  Filled 2018-03-13: qty 5

## 2018-03-13 MED ORDER — SODIUM CHLORIDE 0.9 % IV SOLN
400.0000 mg | Freq: Once | INTRAVENOUS | Status: AC
  Administered 2018-03-13: 400 mg via INTRAVENOUS
  Filled 2018-03-13: qty 40

## 2018-03-13 MED ORDER — DIPHENHYDRAMINE HCL 25 MG PO CAPS: 25.0000 mg | ORAL_CAPSULE | Freq: Once | ORAL | Status: DC

## 2018-03-13 MED ORDER — SODIUM CHLORIDE 0.9 % IV SOLN
Freq: Once | INTRAVENOUS | Status: AC
  Administered 2018-03-13: 09:00:00 via INTRAVENOUS
  Filled 2018-03-13: qty 250

## 2018-03-13 MED ORDER — PALONOSETRON HCL INJECTION 0.25 MG/5ML
0.2500 mg | Freq: Once | INTRAVENOUS | Status: AC
  Administered 2018-03-13: 0.25 mg via INTRAVENOUS

## 2018-03-13 MED ORDER — SODIUM CHLORIDE 0.9% FLUSH
10.0000 mL | Freq: Once | INTRAVENOUS | Status: AC
  Administered 2018-03-13: 10 mL
  Filled 2018-03-13: qty 10

## 2018-03-13 MED ORDER — DIPHENHYDRAMINE HCL 50 MG/ML IJ SOLN
25.0000 mg | Freq: Once | INTRAMUSCULAR | Status: AC
  Administered 2018-03-13: 25 mg via INTRAVENOUS

## 2018-03-13 MED ORDER — DIPHENHYDRAMINE HCL 25 MG PO CAPS
ORAL_CAPSULE | ORAL | Status: AC
  Filled 2018-03-13: qty 1

## 2018-03-13 NOTE — Progress Notes (Signed)
Continue carboplatin at AUC of 4 per Dr Alen Blew

## 2018-03-13 NOTE — Patient Instructions (Signed)
[facility] Discharge Instructions for Patients Receiving Chemotherapy  Today you received the following chemotherapy agents: Gemcitabine (Gemzar) and Carboplatin (Paraplatin)  To help prevent nausea and vomiting after your treatment, we encourage you to take your nausea medication as prescribed. Received Aloxi during treatment today-->Take Compazine (not Zofran) for the next 3 days as needed.    If you develop nausea and vomiting that is not controlled by your nausea medication, call the clinic.   BELOW ARE SYMPTOMS THAT SHOULD BE REPORTED IMMEDIATELY:  *FEVER GREATER THAN 100.5 F  *CHILLS WITH OR WITHOUT FEVER  NAUSEA AND VOMITING THAT IS NOT CONTROLLED WITH YOUR NAUSEA MEDICATION  *UNUSUAL SHORTNESS OF BREATH  *UNUSUAL BRUISING OR BLEEDING  TENDERNESS IN MOUTH AND THROAT WITH OR WITHOUT PRESENCE OF ULCERS  *URINARY PROBLEMS  *BOWEL PROBLEMS  UNUSUAL RASH Items with * indicate a potential emergency and should be followed up as soon as possible.  Feel free to call the clinic should you have any questions or concerns. The clinic phone number is [phone].  Please show the [facility] at check-in to the Emergency Department and triage nurse.

## 2018-03-13 NOTE — Progress Notes (Signed)
Hematology and Oncology Follow Up Visit  [name] [id number] 1941-05-11 77 y.o. 03/13/2018 7:57 AM [name], MDFusco, Purcell Nails, MD   Principle Diagnosis: 77 year old man with stage IV bladder cancer with documented disease to the bone after initial diagnosis of August 2018.  He developed metastatic disease in 2019.  His tumor is PDL 1 positive  with CPS score over 90%.  Prior Therapy:   He is status post neoadjuvant chemotherapy utilizing gemcitabine and cisplatin started in September 2018.  He received day 1 of cycle 1 on 05/02/2017 and therapy discontinued after that.  He is status post laparoscopic Cystoprostatectomy and bilateral lymphadenectomy done by Dr. Tresa Moore on 06/29/2017.  A final pathology showed a T3bN0 disease.  He has 13 lymph nodes sampled without any evidence of malignancy.  He completed radiation therapy to the pelvis on January 16, 2018 under the care of Dr. Tammi Klippel.  Current therapy: Carboplatin and gemcitabine he is here for day 1 of cycle 3.  Interim History: [name] presents today for a follow-up.  He completed the second cycle of chemotherapy without any dose reduction or delay.  He tolerated the chemotherapy without any major complication except for periodic pruritus and rash.  He has used hydroxyzine and prednisone with improvement in his symptoms.  He denies any nausea, vomiting or abdominal distention.  He does report periodic constipation and he has not been using stool softeners regularly.  His pain is manageable with oxycodone for mild pain and Dilaudid for severe pain.  He takes these medication 1 or 2 times a day.  He is ambulating without any difficulties and is able to sit for longer period of time.  His pain is predominantly in the right hip and described as sharp but occasionally becomes dull.  No neurological deficits or radiation of his pain.  He does not report any headaches, blurry vision, syncope or seizures.  He denies any dizziness or alteration  in mental status.  He does not report any fevers, chills or sweats. He does not report any cough, wheezing or dyspnea on exertion. He does not report any chest pain, palpitation, orthopnea or leg edema. He does not report any nausea, vomiting, abdominal pain,diarrhea.  He denied hematochezia or melena he does not report any frequency urgency or hesitancy.  He denies any ecchymosis.    Denies any anxiety or depression.  Remaining review of systems is negative.  Medications: I have reviewed the patient's current medications.  Current Outpatient Medications  Medication Sig Dispense Refill  . allopurinol (ZYLOPRIM) 100 MG tablet Take 100 mg by mouth daily.    Marland Kitchen HYDROmorphone (DILAUDID) 4 MG tablet Take 1 tablet (4 mg total) by mouth every 4 (four) hours as needed for severe pain. 30 tablet 0  . hydrOXYzine (ATARAX/VISTARIL) 50 MG tablet Take 1 tablet (50 mg total) by mouth 4 (four) times daily as needed. (Patient not taking: Reported on 02/20/2018) 30 tablet 0  . lidocaine-prilocaine (EMLA) cream Apply 1 application topically as needed. (Patient not taking: Reported on 02/20/2018) 30 g 0  . oxyCODONE (OXY IR/ROXICODONE) 5 MG immediate release tablet Take 1-2 tablets (5-10 mg total) by mouth every 4 (four) hours as needed for severe pain. 60 tablet 0  . predniSONE (STERAPRED UNI-PAK 21 TAB) 10 MG (21) TBPK tablet Take 6 tablets for a day.  Take 5 tablets for a day, then Take 4 tablets for a day Take 3 tablets for a day Take 2 tablets for a day Take one tablet for  day and stop. 21 tablet 0  . prochlorperazine (COMPAZINE) 10 MG tablet Take 1 tablet (10 mg total) by mouth every 6 (six) hours as needed for nausea or vomiting. 30 tablet 0   No current facility-administered medications for this visit.      Allergies:  Allergies  Allergen Reactions  . Ciprofloxacin Hives    Past Medical History, Surgical history, Social history, and Family History were reviewed and updated.  Physical Exam:  Blood  pressure 124/77, pulse (!) 108, temperature 98.6 F (37 C), temperature source Oral, resp. rate 17, height 5\' 11"  (1.803 m), weight 186 lb 11.2 oz (84.7 kg), SpO2 100 %.    ECOG: 1 General appearance: Alert, awake gentleman without distress. Head: Normocephalic without abnormalities. Oropharynx: No oral thrush or ulcers. Eyes: Pupils are equal and round reactive to light. Lymph nodes: No cervical, supraclavicular, inguinal or axillary lymphadenopathy. Heart: Regular rate and rhythm.  Without any murmurs or leg edema. Lung: clear that any rhonchi, wheezes or dullness to percussion. Abdomin: Soft, without any rebound or guarding.  No shifting dullness or ascites. Musculoskeletal: No clubbing or cyanosis. Skin:  Neurological: No motor or sensory deficits.   Lab Results: Lab Results  Component Value Date   WBC 4.6 02/27/2018   HGB 9.4 (L) 02/27/2018   HCT 29.7 (L) 02/27/2018   MCV 95.8 02/27/2018   PLT 209 02/27/2018     Chemistry      Component Value Date/Time   NA 133 (L) 02/27/2018 0952   NA 141 05/23/2017 0828   K 4.0 02/27/2018 0952   K 4.0 05/23/2017 0828   CL 96 (L) 02/27/2018 0952   CO2 26 02/27/2018 0952   CO2 27 05/23/2017 0828   BUN 18 02/27/2018 0952   BUN 12.5 05/23/2017 0828   CREATININE 0.73 02/27/2018 0952   CREATININE 0.8 05/23/2017 0828      Component Value Date/Time   CALCIUM 9.6 02/27/2018 0952   CALCIUM 9.0 05/23/2017 0828   ALKPHOS 65 02/27/2018 0952   ALKPHOS 57 05/23/2017 0828   AST 22 02/27/2018 0952   AST 13 05/23/2017 0828   ALT 65 (H) 02/27/2018 0952   ALT 15 05/23/2017 0828   BILITOT 0.4 02/27/2018 0952   BILITOT 0.37 05/23/2017 0828      Impression and Plan:  77 year old man with:   1. High-grade urothelial carcinoma arising from the bladder diagnosed in 2018 and developed advanced disease in 2019.    He completed 2 cycles of carboplatin and gemcitabine without any major complications.  Risks and benefits of continuing  chemotherapy was reviewed today.  Long-term complication associated with this treatment was reviewed.  He is agreeable to continue will complete cycle 3 without any dose reductions.  The plan is to repeat imaging studies after cycle 3 to determine his response.  Plan is to complete 6 cycles of therapy he is able to tolerate it and CT scan showed positive response.  He appears to be clinically improving and I recommend continuing therapy at this time.   2. Hip pain: Manageable with the current pain regimen.  His oxycodone and Dilaudid was refilled today.   3.  Constipation: We have discussed strategies to combat his constipation including using stool softeners and laxatives.  He will take Senokot S2 tablets twice a day with MiraLAX daily.  4. Renal function: Creatinine remains stable.  No changes in his renal function noted.  5.  Goals of care: His performance status and quality of life remain excellent and  aggressive therapy is warranted.  6.  IV access: Port-A-Cath remains in use without complications.  7.  Antiemetics: No issues noted with nausea and vomiting at this time.  8.  Rash: No rash reported but continues to have pruritus.  I will change his Benadryl to intravenous treatment in addition to Pepcid.  9.  Anemia: Related to systemic chemotherapy and chronic disease.  He did report some mild hematochezia associated with his constipation.  I will check iron studies with the next laboratory testing and replace as needed.  No need for transfusion at this time.  10.  Follow-up: Next week for day 8 of cycle 3 and in 3 weeks for the beginning of cycle 4 of therapy.  25  minutes was spent with the patient face-to-face today.  More than 50% of time was dedicated to patient counseling, education and managing multiple complaints related to his cancer and cancer treatment.  Zola Button, MD 7/29/20197:57 AM

## 2018-03-13 NOTE — Telephone Encounter (Signed)
Printed avs and calender of upcoming appointment. Per 7/29 los 

## 2018-03-20 ENCOUNTER — Inpatient Hospital Stay: Payer: Medicare Other

## 2018-03-20 ENCOUNTER — Inpatient Hospital Stay: Payer: Medicare Other | Attending: Oncology

## 2018-03-20 ENCOUNTER — Other Ambulatory Visit: Payer: Self-pay | Admitting: Hematology

## 2018-03-20 VITALS — BP 153/92 | HR 113 | Temp 97.6°F | Resp 14

## 2018-03-20 DIAGNOSIS — Z95828 Presence of other vascular implants and grafts: Secondary | ICD-10-CM

## 2018-03-20 DIAGNOSIS — Z5111 Encounter for antineoplastic chemotherapy: Secondary | ICD-10-CM | POA: Diagnosis not present

## 2018-03-20 DIAGNOSIS — C7951 Secondary malignant neoplasm of bone: Secondary | ICD-10-CM | POA: Insufficient documentation

## 2018-03-20 DIAGNOSIS — Z923 Personal history of irradiation: Secondary | ICD-10-CM | POA: Insufficient documentation

## 2018-03-20 DIAGNOSIS — K5900 Constipation, unspecified: Secondary | ICD-10-CM | POA: Insufficient documentation

## 2018-03-20 DIAGNOSIS — C679 Malignant neoplasm of bladder, unspecified: Secondary | ICD-10-CM

## 2018-03-20 DIAGNOSIS — C7802 Secondary malignant neoplasm of left lung: Secondary | ICD-10-CM | POA: Insufficient documentation

## 2018-03-20 DIAGNOSIS — D649 Anemia, unspecified: Secondary | ICD-10-CM | POA: Insufficient documentation

## 2018-03-20 DIAGNOSIS — Z79899 Other long term (current) drug therapy: Secondary | ICD-10-CM | POA: Insufficient documentation

## 2018-03-20 LAB — CMP (CANCER CENTER ONLY)
ALT: 48 U/L — ABNORMAL HIGH (ref 0–44)
AST: 20 U/L (ref 15–41)
Albumin: 2.6 g/dL — ABNORMAL LOW (ref 3.5–5.0)
Alkaline Phosphatase: 67 U/L (ref 38–126)
Anion gap: 10 (ref 5–15)
BUN: 13 mg/dL (ref 8–23)
CO2: 27 mmol/L (ref 22–32)
Calcium: 9.1 mg/dL (ref 8.9–10.3)
Chloride: 101 mmol/L (ref 98–111)
Creatinine: 0.85 mg/dL (ref 0.61–1.24)
GFR, Est AFR Am: >60 mL/min (ref >60)
GFR, Estimated: >60 mL/min (ref >60)
Glucose, Bld: 124 mg/dL — ABNORMAL HIGH (ref 70–99)
Potassium: 3.9 mmol/L (ref 3.5–5.1)
Sodium: 138 mmol/L (ref 135–145)
Total Bilirubin: 0.4 mg/dL (ref 0.3–1.2)
Total Protein: 5.7 g/dL — ABNORMAL LOW (ref 6.5–8.1)

## 2018-03-20 LAB — CBC WITH DIFFERENTIAL (CANCER CENTER ONLY)
Basophils Absolute: 0.1 10*3/uL (ref 0.0–0.1)
Basophils Relative: 4 %
Eosinophils Absolute: 0 10*3/uL (ref 0.0–0.5)
Eosinophils Relative: 1 %
HCT: 24.1 % — ABNORMAL LOW (ref 38.4–49.9)
Hemoglobin: 7.8 g/dL — ABNORMAL LOW (ref 13.0–17.1)
Lymphocytes Relative: 14 %
Lymphs Abs: 0.2 10*3/uL — ABNORMAL LOW (ref 0.9–3.3)
MCH: 30.5 pg (ref 27.2–33.4)
MCHC: 32.5 g/dL (ref 32.0–36.0)
MCV: 93.9 fL (ref 79.3–98.0)
Monocytes Absolute: 0.3 10*3/uL (ref 0.1–0.9)
Monocytes Relative: 20 %
Neutro Abs: 1 10*3/uL — ABNORMAL LOW (ref 1.5–6.5)
Neutrophils Relative %: 61 %
Platelet Count: 196 10*3/uL (ref 140–400)
RBC: 2.57 MIL/uL — ABNORMAL LOW (ref 4.20–5.82)
RDW: 17.5 % — ABNORMAL HIGH (ref 11.0–14.6)
WBC Count: 1.6 10*3/uL — ABNORMAL LOW (ref 4.0–10.3)

## 2018-03-20 LAB — IRON AND TIBC
Iron: 49 ug/dL (ref 42–163)
Saturation Ratios: 20 % — ABNORMAL LOW (ref 42–163)
TIBC: 249 ug/dL (ref 202–409)
UIBC: 200 ug/dL

## 2018-03-20 LAB — FERRITIN: Ferritin: 759 ng/mL — ABNORMAL HIGH (ref 24–336)

## 2018-03-20 MED ORDER — SODIUM CHLORIDE 0.9% FLUSH
10.0000 mL | Freq: Once | INTRAVENOUS | Status: AC
  Administered 2018-03-20: 10 mL
  Filled 2018-03-20: qty 10

## 2018-03-20 MED ORDER — HEPARIN SOD (PORK) LOCK FLUSH 100 UNIT/ML IV SOLN
500.0000 [IU] | Freq: Once | INTRAVENOUS | Status: AC
  Administered 2018-03-20: 500 [IU]
  Filled 2018-03-20: qty 5

## 2018-03-20 NOTE — Progress Notes (Signed)
Per Dr. Irene Limbo, hold treatment today due to [facility] of 1.0

## 2018-03-20 NOTE — Progress Notes (Signed)
Per Dr. Irene Limbo, hold treatment today due to [place] 1.0.  VSS, pt denies fever/chills or other signs and symptoms of infection.  Pt to return 8/19 for next treatment.  Appointments given.

## 2018-03-31 ENCOUNTER — Ambulatory Visit (HOSPITAL_COMMUNITY)
Admission: RE | Admit: 2018-03-31 | Discharge: 2018-03-31 | Disposition: A | Discharge status: Home / Self Care | Payer: Medicare Other | Source: Ambulatory Visit | Attending: Oncology | Admitting: Oncology

## 2018-03-31 DIAGNOSIS — C7951 Secondary malignant neoplasm of bone: Secondary | ICD-10-CM | POA: Insufficient documentation

## 2018-03-31 DIAGNOSIS — J432 Centrilobular emphysema: Secondary | ICD-10-CM | POA: Diagnosis not present

## 2018-03-31 DIAGNOSIS — I2510 Atherosclerotic heart disease of native coronary artery without angina pectoris: Secondary | ICD-10-CM | POA: Insufficient documentation

## 2018-03-31 DIAGNOSIS — C7802 Secondary malignant neoplasm of left lung: Secondary | ICD-10-CM | POA: Diagnosis not present

## 2018-03-31 DIAGNOSIS — C679 Malignant neoplasm of bladder, unspecified: Secondary | ICD-10-CM | POA: Insufficient documentation

## 2018-03-31 DIAGNOSIS — I700 Atherosclerosis of aorta: Secondary | ICD-10-CM | POA: Diagnosis not present

## 2018-03-31 DIAGNOSIS — C7800 Secondary malignant neoplasm of unspecified lung: Secondary | ICD-10-CM | POA: Diagnosis not present

## 2018-03-31 DIAGNOSIS — C675 Malignant neoplasm of bladder neck: Secondary | ICD-10-CM | POA: Diagnosis not present

## 2018-03-31 DIAGNOSIS — C7801 Secondary malignant neoplasm of right lung: Secondary | ICD-10-CM | POA: Insufficient documentation

## 2018-03-31 MED ORDER — SODIUM CHLORIDE 0.9 % IV SOLN
INTRAVENOUS | Status: AC
  Filled 2018-03-31: qty 250

## 2018-03-31 MED ORDER — IOHEXOL 300 MG/ML  SOLN
100.0000 mL | Freq: Once | INTRAMUSCULAR | Status: AC | PRN
  Administered 2018-03-31: 100 mL via INTRAVENOUS

## 2018-04-03 ENCOUNTER — Telehealth: Payer: Self-pay | Admitting: Oncology

## 2018-04-03 ENCOUNTER — Inpatient Hospital Stay: Payer: Medicare Other

## 2018-04-03 ENCOUNTER — Inpatient Hospital Stay (HOSPITAL_BASED_OUTPATIENT_CLINIC_OR_DEPARTMENT_OTHER): Payer: Medicare Other | Admitting: Oncology

## 2018-04-03 VITALS — BP 135/84 | HR 115 | Temp 98.1°F | Resp 18 | Ht 71.0 in | Wt 183.9 lb

## 2018-04-03 DIAGNOSIS — K5900 Constipation, unspecified: Secondary | ICD-10-CM | POA: Diagnosis not present

## 2018-04-03 DIAGNOSIS — D649 Anemia, unspecified: Secondary | ICD-10-CM

## 2018-04-03 DIAGNOSIS — C7802 Secondary malignant neoplasm of left lung: Secondary | ICD-10-CM | POA: Diagnosis not present

## 2018-04-03 DIAGNOSIS — C7951 Secondary malignant neoplasm of bone: Secondary | ICD-10-CM | POA: Diagnosis not present

## 2018-04-03 DIAGNOSIS — C679 Malignant neoplasm of bladder, unspecified: Secondary | ICD-10-CM

## 2018-04-03 DIAGNOSIS — Z923 Personal history of irradiation: Secondary | ICD-10-CM | POA: Diagnosis not present

## 2018-04-03 DIAGNOSIS — Z5111 Encounter for antineoplastic chemotherapy: Secondary | ICD-10-CM | POA: Diagnosis not present

## 2018-04-03 DIAGNOSIS — Z79899 Other long term (current) drug therapy: Secondary | ICD-10-CM

## 2018-04-03 LAB — CMP (CANCER CENTER ONLY)
ALT: 16 U/L (ref 0–44)
AST: 12 U/L — ABNORMAL LOW (ref 15–41)
Albumin: 2.7 g/dL — ABNORMAL LOW (ref 3.5–5.0)
Alkaline Phosphatase: 83 U/L (ref 38–126)
Anion gap: 8 (ref 5–15)
BUN: 12 mg/dL (ref 8–23)
CO2: 27 mmol/L (ref 22–32)
Calcium: 9.2 mg/dL (ref 8.9–10.3)
Chloride: 106 mmol/L (ref 98–111)
Creatinine: 0.74 mg/dL (ref 0.61–1.24)
GFR, Est AFR Am: >60 mL/min (ref >60)
GFR, Estimated: >60 mL/min (ref >60)
Glucose, Bld: 111 mg/dL — ABNORMAL HIGH (ref 70–99)
Potassium: 4 mmol/L (ref 3.5–5.1)
Sodium: 141 mmol/L (ref 135–145)
Total Bilirubin: 0.3 mg/dL (ref 0.3–1.2)
Total Protein: 5.7 g/dL — ABNORMAL LOW (ref 6.5–8.1)

## 2018-04-03 LAB — CBC WITH DIFFERENTIAL (CANCER CENTER ONLY)
Basophils Absolute: 0 10*3/uL (ref 0.0–0.1)
Basophils Relative: 1 %
Eosinophils Absolute: 0.1 10*3/uL (ref 0.0–0.5)
Eosinophils Relative: 2 %
HCT: 34 % — ABNORMAL LOW (ref 38.4–49.9)
Hemoglobin: 10.1 g/dL — ABNORMAL LOW (ref 13.0–17.1)
Lymphocytes Relative: 10 %
Lymphs Abs: 0.4 10*3/uL — ABNORMAL LOW (ref 0.9–3.3)
MCH: 31.2 pg (ref 27.2–33.4)
MCHC: 29.7 g/dL — ABNORMAL LOW (ref 32.0–36.0)
MCV: 104.9 fL — ABNORMAL HIGH (ref 79.3–98.0)
Monocytes Absolute: 0.9 10*3/uL (ref 0.1–0.9)
Monocytes Relative: 24 %
Neutro Abs: 2.3 10*3/uL (ref 1.5–6.5)
Neutrophils Relative %: 63 %
Platelet Count: 277 10*3/uL (ref 140–400)
RBC: 3.24 MIL/uL — ABNORMAL LOW (ref 4.20–5.82)
RDW: 21 % — ABNORMAL HIGH (ref 11.0–14.6)
WBC Count: 3.6 10*3/uL — ABNORMAL LOW (ref 4.0–10.3)

## 2018-04-03 MED ORDER — SODIUM CHLORIDE 0.9 % IV SOLN
Freq: Once | INTRAVENOUS | Status: AC
  Administered 2018-04-03: 09:00:00 via INTRAVENOUS
  Filled 2018-04-03: qty 250

## 2018-04-03 MED ORDER — FAMOTIDINE IN NACL 20-0.9 MG/50ML-% IV SOLN
20.0000 mg | Freq: Once | INTRAVENOUS | Status: AC
  Administered 2018-04-03: 20 mg via INTRAVENOUS

## 2018-04-03 MED ORDER — DIPHENHYDRAMINE HCL 50 MG/ML IJ SOLN
12.5000 mg | Freq: Once | INTRAMUSCULAR | Status: AC
  Administered 2018-04-03: 12.5 mg via INTRAVENOUS

## 2018-04-03 MED ORDER — PALONOSETRON HCL INJECTION 0.25 MG/5ML
INTRAVENOUS | Status: AC
  Filled 2018-04-03: qty 5

## 2018-04-03 MED ORDER — FAMOTIDINE IN NACL 20-0.9 MG/50ML-% IV SOLN
INTRAVENOUS | Status: AC
  Filled 2018-04-03: qty 50

## 2018-04-03 MED ORDER — HYDROMORPHONE HCL 4 MG PO TABS: 4.0000 mg | ORAL_TABLET | ORAL | 0 refills | Status: DC | PRN

## 2018-04-03 MED ORDER — OXYCODONE HCL 5 MG PO TABS: 5.0000 mg | ORAL_TABLET | ORAL | 0 refills | Status: DC | PRN

## 2018-04-03 MED ORDER — SODIUM CHLORIDE 0.9 % IV SOLN
412.8000 mg | Freq: Once | INTRAVENOUS | Status: AC
  Administered 2018-04-03: 410 mg via INTRAVENOUS
  Filled 2018-04-03: qty 41

## 2018-04-03 MED ORDER — PALONOSETRON HCL INJECTION 0.25 MG/5ML
0.2500 mg | Freq: Once | INTRAVENOUS | Status: AC
  Administered 2018-04-03: 0.25 mg via INTRAVENOUS

## 2018-04-03 MED ORDER — HYDROXYZINE HCL 50 MG PO TABS: 50.0000 mg | ORAL_TABLET | Freq: Four times a day (QID) | ORAL | 0 refills | Status: DC | PRN

## 2018-04-03 MED ORDER — SODIUM CHLORIDE 0.9 % IV SOLN
800.0000 mg/m2 | Freq: Once | INTRAVENOUS | Status: AC
  Administered 2018-04-03: 1710 mg via INTRAVENOUS
  Filled 2018-04-03: qty 44.97

## 2018-04-03 MED ORDER — SODIUM CHLORIDE 0.9 % IV SOLN
Freq: Once | INTRAVENOUS | Status: AC
  Administered 2018-04-03: 10:00:00 via INTRAVENOUS
  Filled 2018-04-03: qty 5

## 2018-04-03 MED ORDER — DIPHENHYDRAMINE HCL 50 MG/ML IJ SOLN
INTRAMUSCULAR | Status: AC
  Filled 2018-04-03: qty 1

## 2018-04-03 NOTE — Progress Notes (Signed)
Hematology and Oncology Follow Up Visit  [name] [id number] 1941-04-09 77 y.o. 04/03/2018 8:44 AM [name], MDFusco, Purcell Nails, MD   Principle Diagnosis: 77 year old man with stage bladder cancer diagnosed in August 2018.  He developed IV disease with pulmonary nodules and pelvic metastasis in 2019.  His tumor is PDL 1 positive  with CPS score over 90%.  Prior Therapy:   He is status post neoadjuvant chemotherapy utilizing gemcitabine and cisplatin started in September 2018.  He received day 1 of cycle 1 on 05/02/2017 and therapy discontinued after that.  He is status post laparoscopic Cystoprostatectomy and bilateral lymphadenectomy done by Dr. Tresa Moore on 06/29/2017. Final pathology showed a T3bN0 disease.  He has 13 lymph nodes sampled without any evidence of malignancy.  He completed radiation therapy to the pelvis on January 16, 2018 under the care of Dr. Tammi Klippel.  Current therapy: Carboplatin and gemcitabine he is here for day 1 of cycle 4.   Interim History: Mr. [name] here for a follow-up visit.  Since the last visit, he reports no major changes in his health.  He did not receive day 8 of the last cycle of chemotherapy because of pancytopenia.  He has fully recovered at this time.  His hip pain still manageable with alternating Dilaudid and oxycodone.  Overall his pain has improved.  Last chemotherapy did not cause him any major issues including no excessive fatigue, nausea or change in his bowel habits.  His energy level is reasonable.  He did have diarrhea this morning and had 3 loose bowel movements.  He does not report any headaches, blurry vision, syncope or seizures.  He denies any confusion or lethargy.  He does not report any fevers, chills or sweats. He does not report any cough, wheezing or dyspnea on exertion. He does not report any chest pain, palpitation, orthopnea or leg edema. He does not report any nausea, vomiting.  He denies any early satiety or distention.  He denied  hematochezia or melena.  He does not report any frequency urgency or hesitancy.  He denies any rashes or lesions.  He denies any lymphadenopathy or petechiae.   Denies any in his mood.  Remaining review of systems is negative.  Medications: I have reviewed the patient's current medications.  Current Outpatient Medications  Medication Sig Dispense Refill  . allopurinol (ZYLOPRIM) 100 MG tablet Take 100 mg by mouth daily.    Marland Kitchen HYDROmorphone (DILAUDID) 4 MG tablet Take 1 tablet (4 mg total) by mouth every 4 (four) hours as needed for severe pain. 30 tablet 0  . hydrOXYzine (ATARAX/VISTARIL) 50 MG tablet Take 1 tablet (50 mg total) by mouth 4 (four) times daily as needed. 30 tablet 0  . lidocaine-prilocaine (EMLA) cream Apply 1 application topically as needed. 30 g 0  . oxyCODONE (OXY IR/ROXICODONE) 5 MG immediate release tablet Take 1-2 tablets (5-10 mg total) by mouth every 4 (four) hours as needed for severe pain. 60 tablet 0  . predniSONE (STERAPRED UNI-PAK 21 TAB) 10 MG (21) TBPK tablet Take 6 tablets for a day.  Take 5 tablets for a day, then Take 4 tablets for a day Take 3 tablets for a day Take 2 tablets for a day Take one tablet for day and stop. 21 tablet 0  . prochlorperazine (COMPAZINE) 10 MG tablet Take 1 tablet (10 mg total) by mouth every 6 (six) hours as needed for nausea or vomiting. 30 tablet 0   No current facility-administered medications for this visit.  Allergies:  Allergies  Allergen Reactions  . Ciprofloxacin Hives    Past Medical History, Surgical history, Social history, and Family History were reviewed and updated.  Physical Exam:   Blood pressure 135/84, pulse (!) 115, temperature 98.1 F (36.7 C), temperature source Oral, resp. rate 18, height 5\' 11"  (1.803 m), weight 183 lb 14.4 oz (83.4 kg), SpO2 100 %.    ECOG: 1 General appearance: Well-appearing gentleman without distress. Head: Atraumatic without abnormalities. Oropharynx: No oral thrush or  ulcers. Eyes: Sclera anicteric. Lymph nodes: No adenopathy noted in the cervical, supraclavicular, inguinal or axillary regions. Heart: Regular rate and rhythm.  Without any murmurs or leg edema. Lung: clear in all lung fields without any rhonchi, wheezes or dullness to percussion. Abdomin: Soft, any rebound or guarding.  Good bowel sounds. Musculoskeletal: No deformity or effusion. Skin: No rashes or lesions. Neurological: Intact motor, sensory and deep tendon reflexes.   Lab Results: Lab Results  Component Value Date   WBC 1.6 (L) 03/20/2018   HGB 7.8 (L) 03/20/2018   HCT 24.1 (L) 03/20/2018   MCV 93.9 03/20/2018   PLT 196 03/20/2018     Chemistry      Component Value Date/Time   NA 138 03/20/2018 1203   NA 141 05/23/2017 0828   K 3.9 03/20/2018 1203   K 4.0 05/23/2017 0828   CL 101 03/20/2018 1203   CO2 27 03/20/2018 1203   CO2 27 05/23/2017 0828   BUN 13 03/20/2018 1203   BUN 12.5 05/23/2017 0828   CREATININE 0.85 03/20/2018 1203   CREATININE 0.8 05/23/2017 0828      Component Value Date/Time   CALCIUM 9.1 03/20/2018 1203   CALCIUM 9.0 05/23/2017 0828   ALKPHOS 67 03/20/2018 1203   ALKPHOS 57 05/23/2017 0828   AST 20 03/20/2018 1203   AST 13 05/23/2017 0828   ALT 48 (H) 03/20/2018 1203   ALT 15 05/23/2017 0828   BILITOT 0.4 03/20/2018 1203   BILITOT 0.37 05/23/2017 0828     CLINICAL DATA:  Bladder cancer with cystectomy in 2016. Right hip metastasis.  EXAM: CT CHEST, ABDOMEN, AND PELVIS WITH CONTRAST  TECHNIQUE: Multidetector CT imaging of the chest, abdomen and pelvis was performed following the standard protocol during bolus administration of intravenous contrast.  CONTRAST:  169mL OMNIPAQUE IOHEXOL 300 MG/ML  SOLN  COMPARISON:  Chest CT 01/03/2018.  Abdominopelvic CT 11/25/2017.  FINDINGS: CT CHEST FINDINGS  Cardiovascular: A right Port-A-Cath which terminates at the low SVC. Aortic and branch vessel atherosclerosis. Normal heart size,  without pericardial effusion. Multivessel coronary artery atherosclerosis.  Mediastinum/Nodes: No supraclavicular adenopathy. No mediastinal or hilar adenopathy. Tiny hiatal hernia.  Lungs/Pleura: No pleural fluid. Focal right-sided pleural thickening medially is similar.  Moderate centrilobular emphysema with lower lobe predominant bronchial wall thickening.  Bilateral pulmonary nodules. Index left upper lobe pulmonary nodule measures 12 x 12 mm on 41/6 and is unchanged.  Index anterior right upper lobe pulmonary nodule measures 8 x 5 mm on 63/6 and is slightly decreased from 9 x 8 mm on the prior.  The remainder of the smaller pulmonary nodules are similar.  Musculoskeletal: No acute osseous abnormality.  CT ABDOMEN PELVIS FINDINGS  Hepatobiliary: Bilateral hepatic cysts. Normal gallbladder, without biliary ductal dilatation.  Pancreas: Pancreatic parenchymal versus vascular calcifications adjacent the common duct within the head. No duct dilatation or acute inflammation.  Spleen: Normal in size, without focal abnormality.  Adrenals/Urinary Tract: Normal adrenal glands. Favor early contrast excretion in the left kidney, given  absence of stones on prior noncontrast exam. Too small to characterize lesions in both kidneys. Good renal collecting system opacification on delayed images. Suboptimal proximal to mid right ureteric opacification on delayed images. No ureteric filling defect identified. Cystectomy and ileal conduit creation, without acute complication.  Stomach/Bowel: Normal remainder of the stomach. Otherwise normal large and small bowel.  Vascular/Lymphatic: Aortic and branch vessel atherosclerosis. Multiple renal arteries bilaterally. No abdominopelvic adenopathy.  Reproductive: Prostatectomy.  Other: No significant free fluid.  Musculoskeletal: Right inferior pubic ramus lytic lesion with soft tissue component. Measures on the order of  5.2 x 5.1 cm on 92/9. Compare 4.0 by 3.5 cm on the prior exam (when remeasured).  IMPRESSION: 1. Redemonstration of bilateral pulmonary metastasis. The majority of nodules are similar in size. One index nodule has minimally decreased. 2. Progression of right inferior pubic ramus osseous metastasis. 3. No new sites of soft tissue metastasis identified. 4. Cystoprostatectomy, as before. 5. Aortic atherosclerosis (ICD10-I70.0), coronary artery atherosclerosis and emphysema (ICD10-J43.9).      Impression and Plan:  77 year old man with:   1.  Stage IV high-grade urothelial carcinoma arising from the bladder diagnosed in 2018 and developed metastatic disease to pelvic nodes and pulmonary nodules in 2019.  He is status post 3 cycles of therapy with few complications.  CT scan obtained on 03/31/2018 was personally reviewed which showed that pulmonary nodules have decreased in size.  His right pubic ramus soft tissue involvement slightly increased in size.  Risks and benefits of continuing chemotherapy was reviewed today.  I have recommended completing 6 cycles of therapy before repeating imaging studies and consideration for possible switch of therapy to immune-based treatment option.  After discussion today, he is agreeable to proceed.  We will reduce his carboplatin as well as gemcitabine doses because of his recent cytopenia episodes.   2. Hip pain: Manageable at this time with the current pain medication.  I have refilled his Dilaudid and oxycodone.   3.  Constipation: Having diarrhea at this time.  I have recommended to hold his laxatives until his diarrhea resolves.  4. Renal function: His kidney function remains stable with creatinine close to baseline.  5.  Goals of care: Treatments remains palliative but his performance status remains adequate for aggressive therapy.  6.  IV access: Port-A-Cath will continue to be in use for the time being.  7.  Antiemetics: No nausea or  vomiting reported.  8.  Rash: Improved at this time with Atarax prescribed to him.  9.  Anemia: Hemoglobin remains stable with iron studies are normal.  His anemia mostly related to malignancy.  His hemoglobin improved after withholding day 8 of chemotherapy.  10.  Follow-up: Will be with 1 week for day 8 of therapy and in 3 weeks for cycle 5.  25  minutes was spent with the patient face-to-face today.  More than 50% of time was dedicated to discussing the natural course of his disease, reviewing imaging studies and coordinating future plan of care.  Zola Button, MD 8/19/20198:44 AM

## 2018-04-03 NOTE — Patient Instructions (Signed)
[facility] Discharge Instructions for Patients Receiving Chemotherapy  Today you received the following chemotherapy agents: Gemcitabine (Gemzar) and Carboplatin (Paraplatin)  To help prevent nausea and vomiting after your treatment, we encourage you to take your nausea medication as prescribed. Received Aloxi during treatment today-->Take Compazine (not Zofran) for the next 3 days as needed.    If you develop nausea and vomiting that is not controlled by your nausea medication, call the clinic.   BELOW ARE SYMPTOMS THAT SHOULD BE REPORTED IMMEDIATELY:  *FEVER GREATER THAN 100.5 F  *CHILLS WITH OR WITHOUT FEVER  NAUSEA AND VOMITING THAT IS NOT CONTROLLED WITH YOUR NAUSEA MEDICATION  *UNUSUAL SHORTNESS OF BREATH  *UNUSUAL BRUISING OR BLEEDING  TENDERNESS IN MOUTH AND THROAT WITH OR WITHOUT PRESENCE OF ULCERS  *URINARY PROBLEMS  *BOWEL PROBLEMS  UNUSUAL RASH Items with * indicate a potential emergency and should be followed up as soon as possible.  Feel free to call the clinic should you have any questions or concerns. The clinic phone number is [phone].  Please show the [facility] at check-in to the Emergency Department and triage nurse.

## 2018-04-03 NOTE — Telephone Encounter (Signed)
Appts scheduled AVS/Calendar printed per 8/19 los °

## 2018-04-10 ENCOUNTER — Encounter (HOSPITAL_COMMUNITY): Payer: Self-pay | Admitting: Emergency Medicine

## 2018-04-10 ENCOUNTER — Inpatient Hospital Stay: Payer: Medicare Other

## 2018-04-10 ENCOUNTER — Inpatient Hospital Stay (HOSPITAL_COMMUNITY)
Admission: EM | Admit: 2018-04-10 | Discharge: 2018-04-18 | DRG: 314 | Disposition: A | Discharge status: Home / Self Care | Payer: Medicare Other | Attending: Internal Medicine | Admitting: Internal Medicine

## 2018-04-10 ENCOUNTER — Emergency Department (HOSPITAL_COMMUNITY): Payer: Medicare Other

## 2018-04-10 ENCOUNTER — Other Ambulatory Visit: Payer: Self-pay

## 2018-04-10 VITALS — BP 152/81 | HR 96 | Temp 98.0°F | Resp 18

## 2018-04-10 DIAGNOSIS — R931 Abnormal findings on diagnostic imaging of heart and coronary circulation: Secondary | ICD-10-CM | POA: Diagnosis present

## 2018-04-10 DIAGNOSIS — C680 Malignant neoplasm of urethra: Secondary | ICD-10-CM | POA: Diagnosis present

## 2018-04-10 DIAGNOSIS — D709 Neutropenia, unspecified: Secondary | ICD-10-CM

## 2018-04-10 DIAGNOSIS — Y712 Prosthetic and other implants, materials and accessory cardiovascular devices associated with adverse incidents: Secondary | ICD-10-CM | POA: Diagnosis present

## 2018-04-10 DIAGNOSIS — C679 Malignant neoplasm of bladder, unspecified: Secondary | ICD-10-CM

## 2018-04-10 DIAGNOSIS — G893 Neoplasm related pain (acute) (chronic): Secondary | ICD-10-CM | POA: Diagnosis present

## 2018-04-10 DIAGNOSIS — R509 Fever, unspecified: Secondary | ICD-10-CM | POA: Diagnosis not present

## 2018-04-10 DIAGNOSIS — C7911 Secondary malignant neoplasm of bladder: Secondary | ICD-10-CM | POA: Diagnosis present

## 2018-04-10 DIAGNOSIS — N390 Urinary tract infection, site not specified: Secondary | ICD-10-CM | POA: Diagnosis not present

## 2018-04-10 DIAGNOSIS — A419 Sepsis, unspecified organism: Secondary | ICD-10-CM | POA: Diagnosis not present

## 2018-04-10 DIAGNOSIS — T827XXA Infection and inflammatory reaction due to other cardiac and vascular devices, implants and grafts, initial encounter: Secondary | ICD-10-CM | POA: Diagnosis not present

## 2018-04-10 DIAGNOSIS — Z9079 Acquired absence of other genital organ(s): Secondary | ICD-10-CM

## 2018-04-10 DIAGNOSIS — D61818 Other pancytopenia: Secondary | ICD-10-CM

## 2018-04-10 DIAGNOSIS — R5081 Fever presenting with conditions classified elsewhere: Secondary | ICD-10-CM | POA: Diagnosis present

## 2018-04-10 DIAGNOSIS — A4189 Other specified sepsis: Secondary | ICD-10-CM

## 2018-04-10 DIAGNOSIS — R7881 Bacteremia: Secondary | ICD-10-CM

## 2018-04-10 DIAGNOSIS — C20 Malignant neoplasm of rectum: Secondary | ICD-10-CM | POA: Diagnosis not present

## 2018-04-10 DIAGNOSIS — R3915 Urgency of urination: Secondary | ICD-10-CM | POA: Diagnosis not present

## 2018-04-10 DIAGNOSIS — B9689 Other specified bacterial agents as the cause of diseases classified elsewhere: Secondary | ICD-10-CM | POA: Diagnosis present

## 2018-04-10 DIAGNOSIS — T451X5A Adverse effect of antineoplastic and immunosuppressive drugs, initial encounter: Secondary | ICD-10-CM | POA: Diagnosis present

## 2018-04-10 DIAGNOSIS — R350 Frequency of micturition: Secondary | ICD-10-CM | POA: Diagnosis not present

## 2018-04-10 DIAGNOSIS — Z95828 Presence of other vascular implants and grafts: Secondary | ICD-10-CM

## 2018-04-10 DIAGNOSIS — Z87891 Personal history of nicotine dependence: Secondary | ICD-10-CM

## 2018-04-10 DIAGNOSIS — C7951 Secondary malignant neoplasm of bone: Secondary | ICD-10-CM | POA: Diagnosis present

## 2018-04-10 DIAGNOSIS — C7989 Secondary malignant neoplasm of other specified sites: Secondary | ICD-10-CM | POA: Diagnosis present

## 2018-04-10 DIAGNOSIS — D6959 Other secondary thrombocytopenia: Secondary | ICD-10-CM | POA: Diagnosis present

## 2018-04-10 DIAGNOSIS — I38 Endocarditis, valve unspecified: Secondary | ICD-10-CM | POA: Diagnosis present

## 2018-04-10 DIAGNOSIS — Z936 Other artificial openings of urinary tract status: Secondary | ICD-10-CM

## 2018-04-10 DIAGNOSIS — R6510 Systemic inflammatory response syndrome (SIRS) of non-infectious origin without acute organ dysfunction: Secondary | ICD-10-CM

## 2018-04-10 DIAGNOSIS — G894 Chronic pain syndrome: Secondary | ICD-10-CM | POA: Diagnosis present

## 2018-04-10 DIAGNOSIS — Z9221 Personal history of antineoplastic chemotherapy: Secondary | ICD-10-CM

## 2018-04-10 DIAGNOSIS — Z86010 Personal history of colonic polyps: Secondary | ICD-10-CM

## 2018-04-10 DIAGNOSIS — B9620 Unspecified Escherichia coli [E. coli] as the cause of diseases classified elsewhere: Secondary | ICD-10-CM | POA: Diagnosis present

## 2018-04-10 DIAGNOSIS — Z881 Allergy status to other antibiotic agents status: Secondary | ICD-10-CM

## 2018-04-10 LAB — URINALYSIS, ROUTINE W REFLEX MICROSCOPIC
Bilirubin Urine: NEGATIVE
Glucose, UA: NEGATIVE mg/dL
Hgb urine dipstick: NEGATIVE
Ketones, ur: NEGATIVE mg/dL
Nitrite: POSITIVE — AB
Protein, ur: NEGATIVE mg/dL
Specific Gravity, Urine: 1.01 (ref 1.005–1.030)
pH: 6 (ref 5.0–8.0)

## 2018-04-10 LAB — CMP (CANCER CENTER ONLY)
ALT: 33 U/L (ref 0–44)
AST: 18 U/L (ref 15–41)
Albumin: 2.5 g/dL — ABNORMAL LOW (ref 3.5–5.0)
Alkaline Phosphatase: 61 U/L (ref 38–126)
Anion gap: 7 (ref 5–15)
BUN: 13 mg/dL (ref 8–23)
CO2: 29 mmol/L (ref 22–32)
Calcium: 9.1 mg/dL (ref 8.9–10.3)
Chloride: 100 mmol/L (ref 98–111)
Creatinine: 0.66 mg/dL (ref 0.61–1.24)
GFR, Est AFR Am: >60 mL/min (ref >60)
GFR, Estimated: >60 mL/min (ref >60)
Glucose, Bld: 102 mg/dL — ABNORMAL HIGH (ref 70–99)
Potassium: 3.6 mmol/L (ref 3.5–5.1)
Sodium: 136 mmol/L (ref 135–145)
Total Bilirubin: 0.5 mg/dL (ref 0.3–1.2)
Total Protein: 5.3 g/dL — ABNORMAL LOW (ref 6.5–8.1)

## 2018-04-10 LAB — CBC WITH DIFFERENTIAL/PLATELET
Basophils Absolute: 0 10*3/uL (ref 0.0–0.1)
Basophils Relative: 0 %
Eosinophils Absolute: 0 10*3/uL (ref 0.0–0.7)
Eosinophils Relative: 0 %
HCT: 28.6 % — ABNORMAL LOW (ref 39.0–52.0)
Hemoglobin: 9.2 g/dL — ABNORMAL LOW (ref 13.0–17.0)
Lymphocytes Relative: 8 %
Lymphs Abs: 0.4 10*3/uL — ABNORMAL LOW (ref 0.7–4.0)
MCH: 31.8 pg (ref 26.0–34.0)
MCHC: 32.2 g/dL (ref 30.0–36.0)
MCV: 99 fL (ref 78.0–100.0)
Monocytes Absolute: 0.5 10*3/uL (ref 0.1–1.0)
Monocytes Relative: 10 %
Neutro Abs: 3.8 10*3/uL (ref 1.7–7.7)
Neutrophils Relative %: 82 %
Platelets: 90 10*3/uL — ABNORMAL LOW (ref 150–400)
RBC: 2.89 MIL/uL — ABNORMAL LOW (ref 4.22–5.81)
RDW: 17.3 % — ABNORMAL HIGH (ref 11.5–15.5)
WBC: 4.7 10*3/uL (ref 4.0–10.5)

## 2018-04-10 LAB — CBC WITH DIFFERENTIAL (CANCER CENTER ONLY)
Basophils Absolute: 0 10*3/uL (ref 0.0–0.1)
Basophils Relative: 1 %
Eosinophils Absolute: 0 10*3/uL (ref 0.0–0.5)
Eosinophils Relative: 0 %
HCT: 28.9 % — ABNORMAL LOW (ref 38.4–49.9)
Hemoglobin: 9.1 g/dL — ABNORMAL LOW (ref 13.0–17.1)
Lymphocytes Relative: 7 %
Lymphs Abs: 0.3 10*3/uL — ABNORMAL LOW (ref 0.9–3.3)
MCH: 31.3 pg (ref 27.2–33.4)
MCHC: 31.5 g/dL — ABNORMAL LOW (ref 32.0–36.0)
MCV: 99.3 fL — ABNORMAL HIGH (ref 79.3–98.0)
Monocytes Absolute: 0.6 10*3/uL (ref 0.1–0.9)
Monocytes Relative: 14 %
Neutro Abs: 3 10*3/uL (ref 1.5–6.5)
Neutrophils Relative %: 78 %
Platelet Count: 98 10*3/uL — ABNORMAL LOW (ref 140–400)
RBC: 2.91 MIL/uL — ABNORMAL LOW (ref 4.20–5.82)
RDW: 17.5 % — ABNORMAL HIGH (ref 11.0–14.6)
WBC Count: 3.9 10*3/uL — ABNORMAL LOW (ref 4.0–10.3)

## 2018-04-10 LAB — COMPREHENSIVE METABOLIC PANEL
ALT: 34 U/L (ref 0–44)
AST: 26 U/L (ref 15–41)
Albumin: 2.8 g/dL — ABNORMAL LOW (ref 3.5–5.0)
Alkaline Phosphatase: 50 U/L (ref 38–126)
Anion gap: 9 (ref 5–15)
BUN: 17 mg/dL (ref 8–23)
CO2: 25 mmol/L (ref 22–32)
Calcium: 8.7 mg/dL — ABNORMAL LOW (ref 8.9–10.3)
Chloride: 100 mmol/L (ref 98–111)
Creatinine, Ser: 0.8 mg/dL (ref 0.61–1.24)
GFR calc Af Amer: >60 mL/min (ref >60)
GFR calc non Af Amer: >60 mL/min (ref >60)
Glucose, Bld: 118 mg/dL — ABNORMAL HIGH (ref 70–99)
Potassium: 3.6 mmol/L (ref 3.5–5.1)
Sodium: 134 mmol/L — ABNORMAL LOW (ref 135–145)
Total Bilirubin: 1 mg/dL (ref 0.3–1.2)
Total Protein: 5.8 g/dL — ABNORMAL LOW (ref 6.5–8.1)

## 2018-04-10 LAB — CG4 I-STAT (LACTIC ACID): Lactic Acid, Venous: 0.94 mmol/L (ref 0.5–1.9)

## 2018-04-10 MED ORDER — FAMOTIDINE IN NACL 20-0.9 MG/50ML-% IV SOLN
20.0000 mg | Freq: Once | INTRAVENOUS | Status: AC
  Administered 2018-04-10: 20 mg via INTRAVENOUS

## 2018-04-10 MED ORDER — HEPARIN SOD (PORK) LOCK FLUSH 100 UNIT/ML IV SOLN
500.0000 [IU] | Freq: Once | INTRAVENOUS | Status: AC | PRN
  Administered 2018-04-10: 500 [IU]
  Filled 2018-04-10: qty 5

## 2018-04-10 MED ORDER — DIPHENHYDRAMINE HCL 50 MG/ML IJ SOLN
12.5000 mg | Freq: Once | INTRAMUSCULAR | Status: AC
  Administered 2018-04-10: 12.5 mg via INTRAVENOUS

## 2018-04-10 MED ORDER — SODIUM CHLORIDE 0.9 % IV SOLN
800.0000 mg/m2 | Freq: Once | INTRAVENOUS | Status: AC
  Administered 2018-04-10: 1710 mg via INTRAVENOUS
  Filled 2018-04-10: qty 44.97

## 2018-04-10 MED ORDER — DIPHENHYDRAMINE HCL 50 MG/ML IJ SOLN: 12.5000 mg | Freq: Once | INTRAMUSCULAR | Status: DC

## 2018-04-10 MED ORDER — PROCHLORPERAZINE MALEATE 10 MG PO TABS
10.0000 mg | ORAL_TABLET | Freq: Once | ORAL | Status: AC
  Administered 2018-04-10: 10 mg via ORAL

## 2018-04-10 MED ORDER — DIPHENHYDRAMINE HCL 50 MG/ML IJ SOLN
INTRAMUSCULAR | Status: AC
  Filled 2018-04-10: qty 1

## 2018-04-10 MED ORDER — SODIUM CHLORIDE 0.9% FLUSH
10.0000 mL | INTRAVENOUS | Status: DC | PRN
  Administered 2018-04-10: 10 mL
  Filled 2018-04-10: qty 10

## 2018-04-10 MED ORDER — SODIUM CHLORIDE 0.9 % IV SOLN
Freq: Once | INTRAVENOUS | Status: AC
  Administered 2018-04-10: 10:00:00 via INTRAVENOUS
  Filled 2018-04-10: qty 250

## 2018-04-10 MED ORDER — FAMOTIDINE IN NACL 20-0.9 MG/50ML-% IV SOLN
INTRAVENOUS | Status: AC
  Filled 2018-04-10: qty 50

## 2018-04-10 MED ORDER — SODIUM CHLORIDE 0.9 % IV BOLUS
1000.0000 mL | Freq: Once | INTRAVENOUS | Status: AC
  Administered 2018-04-10: 1000 mL via INTRAVENOUS

## 2018-04-10 MED ORDER — SODIUM CHLORIDE 0.9 % IV SOLN
2.0000 g | Freq: Once | INTRAVENOUS | Status: AC
  Administered 2018-04-10: 2 g via INTRAVENOUS
  Filled 2018-04-10: qty 2

## 2018-04-10 MED ORDER — PROCHLORPERAZINE MALEATE 10 MG PO TABS
ORAL_TABLET | ORAL | Status: AC
  Filled 2018-04-10: qty 1

## 2018-04-10 MED ORDER — SODIUM CHLORIDE 0.9% FLUSH
10.0000 mL | Freq: Once | INTRAVENOUS | Status: AC
  Administered 2018-04-10: 10 mL
  Filled 2018-04-10: qty 10

## 2018-04-10 NOTE — Patient Instructions (Signed)
[facility] Discharge Instructions for Patients Receiving Chemotherapy  Today you received the following chemotherapy agents: Gemcitabine (Gemzar)  To help prevent nausea and vomiting after your treatment, we encourage you to take your nausea medication as prescribed. Received Aloxi during treatment today-->Take Compazine (not Zofran) for the next 3 days as needed.    If you develop nausea and vomiting that is not controlled by your nausea medication, call the clinic.   BELOW ARE SYMPTOMS THAT SHOULD BE REPORTED IMMEDIATELY:  *FEVER GREATER THAN 100.5 F  *CHILLS WITH OR WITHOUT FEVER  NAUSEA AND VOMITING THAT IS NOT CONTROLLED WITH YOUR NAUSEA MEDICATION  *UNUSUAL SHORTNESS OF BREATH  *UNUSUAL BRUISING OR BLEEDING  TENDERNESS IN MOUTH AND THROAT WITH OR WITHOUT PRESENCE OF ULCERS  *URINARY PROBLEMS  *BOWEL PROBLEMS  UNUSUAL RASH Items with * indicate a potential emergency and should be followed up as soon as possible.  Feel free to call the clinic should you have any questions or concerns. The clinic phone number is [phone].  Please show the [facility] at check-in to the Emergency Department and triage nurse.

## 2018-04-10 NOTE — ED Triage Notes (Signed)
Pt C/O fever. Pt is cancer pt that received his last chemo this AM. Pt reports temp of 102 at home. Pt was given 1g tylenol 1930. Pt denies pain or any other complaints.

## 2018-04-10 NOTE — Progress Notes (Signed)
OK to treat with abnormal labs from 8/26 per MD [facility]

## 2018-04-10 NOTE — ED Provider Notes (Signed)
[facility] EMERGENCY DEPARTMENT Provider Note   CSN: [id number] Arrival date & time: 04/10/18  2118     History   Chief Complaint Chief Complaint  Patient presents with  . Fever    HPI [name] is a 77 y.o. male.  Patient presents with a fever.  He is a cancer patient with history of metastatic bladder cancer and received IV chemotherapy this morning.  His wife states he was laying around at home not having much activity or appetite today.  He was "and "burning up around 7:00 and had a temperature of 102.7.  He received 1 g of Tylenol at 7:30 PM.  Patient complains of fatigue and nausea but denies any other complaints.  No headache, chest pain or abdominal pain.  No vomiting or diarrhea.  He does have a urostomy cannot say whether he is having dysuria hematuria.  Has had UTIs in the past with his chemotherapy.  Denies any pain in his back or pain in his testicles.  Denies any sore throat or rashes.  They called his oncologist and were told to come to the hospital.  The history is provided by the patient and a relative.  Fever   Pertinent negatives include no chest pain, no vomiting, no congestion, no headaches and no cough.    Past Medical History:  Diagnosis Date  . Bladder cancer ([place])   . BPH (benign prostatic hypertrophy)   . Dysuria   . GERD (gastroesophageal reflux disease)   . History of adenomatous polyp of colon    tubular adenoma 06/ 2018  . History of bladder cancer 12/2014   urologist-  dr [name]--  dx High Grade TCC without stromal invasion s/p TURBT and intravesical BCG tx/  recurrent 07/2015 (pTa) TCC  repear intravesical BCG tx   . History of hiatal hernia   . History of urethral stricture   . Nocturia     Patient Active Problem List   Diagnosis Date Noted  . Port-A-Cath in place 02/20/2018  . Encounter for antineoplastic chemotherapy 02/17/2018  . Sepsis due to urinary tract infection ([place]) 01/25/2018  . Sepsis secondary to UTI ([place]) 01/25/2018    . Hyperglycemia   . Goals of care, counseling/discussion 01/18/2018  . Bone metastasis ([place]) 12/29/2017  . Status post ileal conduit ([place]) 06/29/2017  . Bladder cancer ([place]) 06/28/2017  . Malignant neoplasm of urinary bladder ([place]) 04/20/2017  . COPD UNSPECIFIED 03/07/2008    Past Surgical History:  Procedure Laterality Date  . CARDIAC CATHETERIZATION  1997   normal coronary arteries  . CARDIOVASCULAR STRESS TEST  12-14-2005   Low risk perfusion study/  very small scar in the inferior septum from mid ventricle to apex,  no ischemia/  mild inferior septal hypokinesis/  ef 58%  . CATARACT EXTRACTION W/ INTRAOCULAR LENS  IMPLANT, BILATERAL  2011  . COLONOSCOPY  last one 06/ 2018  . CYSTOSCOPY WITH BIOPSY N/A 08/12/2015   Procedure: CYSTOSCOPY WITH BIOPSY AND FULGERATION;  Surgeon: [name], MD;  [facility];  Service: Urology;  Laterality: N/A;  . CYSTOSCOPY WITH INJECTION N/A 06/29/2017   Procedure: CYSTOSCOPY WITH INJECTION OF INDOCYANINE GREEN DYE;  Surgeon: Alexis Frock, MD;  Location: [facility];  Service: Urology;  Laterality: N/A;  . CYSTOSCOPY WITH URETHRAL DILATATION N/A 03/21/2017   Procedure: CYSTOSCOPY;  Surgeon: [name], MD;  Location: [facility];  Service: Urology;  Laterality: N/A;  . ESOPHAGOGASTRODUODENOSCOPY  12-05-2014  . IR IMAGING GUIDED  PORT INSERTION  02/07/2018  . TRANSTHORACIC ECHOCARDIOGRAM  01-31-2008   nomral LVF,  ef 55-65%/  mild pulmonic stenosis without regurg.,  peak transpulomonic valve gradient 57mmHg  . TRANSURETHRAL RESECTION OF BLADDER TUMOR N/A 12/31/2014   Procedure: TRANSURETHRAL RESECTION OF BLADDER TUMOR (TURBT);  Surgeon: Lowella Bandy, MD;  Location[facility];  Service: Urology;  Laterality: N/A;  . TRANSURETHRAL RESECTION OF BLADDER TUMOR N/A 03/21/2017   Procedure: POSSIBLE TRANSURETHRAL RESECTION OF BLADDER TUMOR (TURBT);  Surgeon: [name], MD;  Location:  [facility];  Service: Urology;  Laterality: N/A;        Home Medications    Prior to Admission medications   Medication Sig Start Date End Date Taking? Authorizing Provider  acetaminophen (TYLENOL) 500 MG tablet Take 1,000 mg by mouth every 6 (six) hours as needed.   Yes [provider]  HYDROmorphone (DILAUDID) 4 MG tablet Take 1 tablet (4 mg total) by mouth every 4 (four) hours as needed for severe pain. 04/03/18  Yes [name], MD  hydrOXYzine (ATARAX/VISTARIL) 50 MG tablet Take 1 tablet (50 mg total) by mouth 4 (four) times daily as needed. 04/03/18  Yes [name], MD  lidocaine-prilocaine (EMLA) cream Apply 1 application topically as needed. 01/18/18  Yes [name], MD  oxyCODONE (OXY IR/ROXICODONE) 5 MG immediate release tablet Take 1-2 tablets (5-10 mg total) by mouth every 4 (four) hours as needed for severe pain. 04/03/18  Yes [name], MD  prochlorperazine (COMPAZINE) 10 MG tablet Take 1 tablet (10 mg total) by mouth every 6 (six) hours as needed for nausea or vomiting. 01/18/18  Yes Shadad, Mathis Dad, MD  predniSONE (STERAPRED UNI-PAK 21 TAB) 10 MG (21) TBPK tablet Take 6 tablets for a day.  Take 5 tablets for a day, then Take 4 tablets for a day Take 3 tablets for a day Take 2 tablets for a day Take one tablet for day and stop. Patient not taking: Reported on 04/10/2018 03/02/18   [name], MD    Family History Family History  Problem Relation Age of Onset  . Colon cancer Neg Hx   . Colon polyps Neg Hx   . Diabetes Neg Hx   . Kidney disease Neg Hx   . Esophageal cancer Neg Hx   . Heart disease Neg Hx   . Gallbladder disease Neg Hx     Social History Social History   Tobacco Use  . Smoking status: Former Smoker    Packs/day: 1.00    Years: 34.00    Pack years: 34.00    Types: Cigarettes    Last attempt to quit: 12/26/1988    Years since quitting: 29.3  . Smokeless tobacco: Never Used  Substance Use Topics  .  Alcohol use: Yes    Alcohol/week: 6.0 standard drinks    Types: 6 Cans of beer per week    Comment: BEER  . Drug use: No     Allergies   Ciprofloxacin   Review of Systems Review of Systems  Constitutional: Positive for activity change, appetite change and fever.  HENT: Negative for congestion.   Eyes: Negative for visual disturbance.  Respiratory: Negative for cough, chest tightness and shortness of breath.   Cardiovascular: Negative for chest pain.  Gastrointestinal: Negative for abdominal pain, nausea and vomiting.  Genitourinary: Positive for dysuria, frequency and urgency. Negative for flank pain, hematuria and testicular pain.  Musculoskeletal: Positive for arthralgias and myalgias.  Skin:  Negative for rash.  Neurological: Negative for dizziness, weakness and headaches.   all other systems are negative except as noted in the HPI and PMH.     Physical Exam Updated Vital Signs BP 110/72 (BP Location: Right Arm)   Pulse (!) 114   Temp 98 F (36.7 C) (Oral)   Resp 18   SpO2 93%   Physical Exam  Constitutional: He is oriented to person, place, and time. He appears well-developed and well-nourished. No distress.  HENT:  Head: Normocephalic.  Mouth/Throat: Oropharynx is clear and moist. No oropharyngeal exudate.  Dry mucus membranes  Eyes: Pupils are equal, round, and reactive to light. Conjunctivae and EOM are normal.  Neck: Normal range of motion. Neck supple.  No meningismus.  Cardiovascular: Normal rate, regular rhythm, normal heart sounds and intact distal pulses.  No murmur heard. Pulmonary/Chest: Effort normal and breath sounds normal. No respiratory distress.  Abdominal: Soft. There is tenderness. There is no rebound and no guarding.  Mild diffuse tenderness, urostomy in place  Musculoskeletal: Normal range of motion. He exhibits no edema or tenderness.  Neurological: He is alert and oriented to person, place, and time. No cranial nerve deficit. He exhibits  normal muscle tone. Coordination normal.  No ataxia on finger to nose bilaterally. No pronator drift. 5/5 strength throughout. CN 2-12 intact.Equal grip strength. Sensation intact.   Skin: Skin is warm.  Psychiatric: He has a normal mood and affect. His behavior is normal.  Nursing note and vitals reviewed.    ED Treatments / Results  Labs (all labs ordered are listed, but only abnormal results are displayed) Labs Reviewed  COMPREHENSIVE METABOLIC PANEL - Abnormal; Notable for the following components:      Result Value   Sodium 134 (*)    Glucose, Bld 118 (*)    Calcium 8.7 (*)    Total Protein 5.8 (*)    Albumin 2.8 (*)    All other components within normal limits  CBC WITH DIFFERENTIAL/PLATELET - Abnormal; Notable for the following components:   RBC 2.89 (*)    Hemoglobin 9.2 (*)    HCT 28.6 (*)    RDW 17.3 (*)    Platelets 90 (*)    Lymphs Abs 0.4 (*)    All other components within normal limits  URINALYSIS, ROUTINE W REFLEX MICROSCOPIC - Abnormal; Notable for the following components:   APPearance HAZY (*)    Nitrite POSITIVE (*)    Leukocytes, UA LARGE (*)    Bacteria, UA MANY (*)    All other components within normal limits  CULTURE, BLOOD (ROUTINE X 2)  CULTURE, BLOOD (ROUTINE X 2)  URINE CULTURE  I-STAT CG4 LACTIC ACID, ED  CG4 I-STAT (LACTIC ACID)  I-STAT CG4 LACTIC ACID, ED    EKG None  Radiology Dg Chest 2 View  Result Date: 04/10/2018 CLINICAL DATA:  Fever. Chemotherapy today. History of metastatic bladder cancer. EXAM: CHEST - 2 VIEW COMPARISON:  CT chest March 31, 2018 in chest radiograph January 25, 2018 FINDINGS: Heart size is normal. Mediastinal silhouette is not suspicious. Known pulmonary nodules less conspicuous due to plain radiography. No pleural effusion or focal consolidation. No pneumothorax. Biapical pleural thickening. Single-lumen RIGHT chest Port-A-Cath with distal tip projecting mid superior vena cava. No pneumothorax. Soft tissue planes  and included osseous structures are non suspicious. IMPRESSION: 1. No acute cardiopulmonary process. Electronically Signed   By: Elon Alas M.D.   On: 04/10/2018 22:01    Procedures Procedures (including critical care time)  Medications Ordered in ED Medications  sodium chloride 0.9 % bolus 1,000 mL (1,000 mLs Intravenous New Bag/Given 04/10/18 2259)  ceFEPIme (MAXIPIME) 2 g in sodium chloride 0.9 % 100 mL IVPB (has no administration in time range)     Initial Impression / Assessment and Plan / ED Course  I have reviewed the triage vital signs and the nursing notes.  Pertinent labs & imaging results that were available during my care of the patient were reviewed by me and considered in my medical decision making (see chart for details).    Cancer patient presenting with fever post chemotherapy.  He is tachycardic but has stable vital signs and does not appear toxic or septic.  We will start IV fluids, obtain labs, cultures, urinalysis, chest x-ray  Lactate is normal.  White blood cell count is normal.  He is not neutropenic.  Urinalysis consistent with infection though question the validity of the sample with urostomy.  Start IV fluids and IV cefepime.  Patient appears nontoxic and has normal lactate normal WBC count without neutropenia.  He however is tachycardic despite afebrile in the ED.  Concern for early sepsis.  Given his immunocompromised state, plan overnight observation for IV fluids and IV antibiotics.  Patient is in agreement.  Discussed with Dr. Darrick Meigs. Final Clinical Impressions(s) / ED Diagnoses   Final diagnoses:  Complicated UTI (urinary tract infection)  Status post chemotherapy    ED Discharge Orders    None       Yumalay Circle, Annie Main, MD 04/11/18 [phone]

## 2018-04-11 DIAGNOSIS — N390 Urinary tract infection, site not specified: Secondary | ICD-10-CM | POA: Diagnosis present

## 2018-04-11 DIAGNOSIS — T451X5A Adverse effect of antineoplastic and immunosuppressive drugs, initial encounter: Secondary | ICD-10-CM | POA: Diagnosis present

## 2018-04-11 DIAGNOSIS — C20 Malignant neoplasm of rectum: Secondary | ICD-10-CM | POA: Diagnosis present

## 2018-04-11 DIAGNOSIS — Z881 Allergy status to other antibiotic agents status: Secondary | ICD-10-CM | POA: Diagnosis not present

## 2018-04-11 DIAGNOSIS — R931 Abnormal findings on diagnostic imaging of heart and coronary circulation: Secondary | ICD-10-CM | POA: Diagnosis present

## 2018-04-11 DIAGNOSIS — D6959 Other secondary thrombocytopenia: Secondary | ICD-10-CM | POA: Diagnosis present

## 2018-04-11 DIAGNOSIS — R319 Hematuria, unspecified: Secondary | ICD-10-CM | POA: Diagnosis not present

## 2018-04-11 DIAGNOSIS — Z9221 Personal history of antineoplastic chemotherapy: Secondary | ICD-10-CM | POA: Diagnosis not present

## 2018-04-11 DIAGNOSIS — Z936 Other artificial openings of urinary tract status: Secondary | ICD-10-CM | POA: Diagnosis not present

## 2018-04-11 DIAGNOSIS — C7989 Secondary malignant neoplasm of other specified sites: Secondary | ICD-10-CM | POA: Diagnosis present

## 2018-04-11 DIAGNOSIS — C7951 Secondary malignant neoplasm of bone: Secondary | ICD-10-CM | POA: Diagnosis not present

## 2018-04-11 DIAGNOSIS — Y712 Prosthetic and other implants, materials and accessory cardiovascular devices associated with adverse incidents: Secondary | ICD-10-CM | POA: Diagnosis present

## 2018-04-11 DIAGNOSIS — Z95828 Presence of other vascular implants and grafts: Secondary | ICD-10-CM | POA: Diagnosis not present

## 2018-04-11 DIAGNOSIS — G893 Neoplasm related pain (acute) (chronic): Secondary | ICD-10-CM | POA: Diagnosis present

## 2018-04-11 DIAGNOSIS — D709 Neutropenia, unspecified: Secondary | ICD-10-CM | POA: Diagnosis not present

## 2018-04-11 DIAGNOSIS — R5081 Fever presenting with conditions classified elsewhere: Secondary | ICD-10-CM | POA: Diagnosis not present

## 2018-04-11 DIAGNOSIS — A419 Sepsis, unspecified organism: Secondary | ICD-10-CM | POA: Diagnosis present

## 2018-04-11 DIAGNOSIS — Z9079 Acquired absence of other genital organ(s): Secondary | ICD-10-CM | POA: Diagnosis not present

## 2018-04-11 DIAGNOSIS — R6510 Systemic inflammatory response syndrome (SIRS) of non-infectious origin without acute organ dysfunction: Secondary | ICD-10-CM | POA: Diagnosis not present

## 2018-04-11 DIAGNOSIS — A4189 Other specified sepsis: Secondary | ICD-10-CM | POA: Diagnosis not present

## 2018-04-11 DIAGNOSIS — R7881 Bacteremia: Secondary | ICD-10-CM | POA: Diagnosis not present

## 2018-04-11 DIAGNOSIS — T827XXA Infection and inflammatory reaction due to other cardiac and vascular devices, implants and grafts, initial encounter: Secondary | ICD-10-CM | POA: Diagnosis present

## 2018-04-11 DIAGNOSIS — Z87891 Personal history of nicotine dependence: Secondary | ICD-10-CM | POA: Diagnosis not present

## 2018-04-11 DIAGNOSIS — C679 Malignant neoplasm of bladder, unspecified: Secondary | ICD-10-CM | POA: Diagnosis not present

## 2018-04-11 DIAGNOSIS — Z452 Encounter for adjustment and management of vascular access device: Secondary | ICD-10-CM | POA: Diagnosis not present

## 2018-04-11 DIAGNOSIS — C680 Malignant neoplasm of urethra: Secondary | ICD-10-CM | POA: Diagnosis present

## 2018-04-11 DIAGNOSIS — D61818 Other pancytopenia: Secondary | ICD-10-CM | POA: Diagnosis not present

## 2018-04-11 DIAGNOSIS — Z86010 Personal history of colonic polyps: Secondary | ICD-10-CM | POA: Diagnosis not present

## 2018-04-11 DIAGNOSIS — G894 Chronic pain syndrome: Secondary | ICD-10-CM | POA: Diagnosis present

## 2018-04-11 DIAGNOSIS — C7911 Secondary malignant neoplasm of bladder: Secondary | ICD-10-CM | POA: Diagnosis present

## 2018-04-11 DIAGNOSIS — B9620 Unspecified Escherichia coli [E. coli] as the cause of diseases classified elsewhere: Secondary | ICD-10-CM | POA: Diagnosis present

## 2018-04-11 DIAGNOSIS — B9689 Other specified bacterial agents as the cause of diseases classified elsewhere: Secondary | ICD-10-CM | POA: Diagnosis present

## 2018-04-11 DIAGNOSIS — I38 Endocarditis, valve unspecified: Secondary | ICD-10-CM | POA: Diagnosis present

## 2018-04-11 LAB — COMPREHENSIVE METABOLIC PANEL
ALT: 29 U/L (ref 0–44)
AST: 26 U/L (ref 15–41)
Albumin: 2.3 g/dL — ABNORMAL LOW (ref 3.5–5.0)
Alkaline Phosphatase: 44 U/L (ref 38–126)
Anion gap: 7 (ref 5–15)
BUN: 14 mg/dL (ref 8–23)
CO2: 25 mmol/L (ref 22–32)
Calcium: 8.1 mg/dL — ABNORMAL LOW (ref 8.9–10.3)
Chloride: 104 mmol/L (ref 98–111)
Creatinine, Ser: 0.69 mg/dL (ref 0.61–1.24)
GFR calc Af Amer: >60 mL/min (ref >60)
GFR calc non Af Amer: >60 mL/min (ref >60)
Glucose, Bld: 131 mg/dL — ABNORMAL HIGH (ref 70–99)
Potassium: 3.5 mmol/L (ref 3.5–5.1)
Sodium: 136 mmol/L (ref 135–145)
Total Bilirubin: 0.5 mg/dL (ref 0.3–1.2)
Total Protein: 4.9 g/dL — ABNORMAL LOW (ref 6.5–8.1)

## 2018-04-11 LAB — CBC
HCT: 24.5 % — ABNORMAL LOW (ref 39.0–52.0)
Hemoglobin: 7.7 g/dL — ABNORMAL LOW (ref 13.0–17.0)
MCH: 31.6 pg (ref 26.0–34.0)
MCHC: 31.4 g/dL (ref 30.0–36.0)
MCV: 100.4 fL — ABNORMAL HIGH (ref 78.0–100.0)
Platelets: 69 10*3/uL — ABNORMAL LOW (ref 150–400)
RBC: 2.44 MIL/uL — ABNORMAL LOW (ref 4.22–5.81)
RDW: 17.3 % — ABNORMAL HIGH (ref 11.5–15.5)
WBC: 3.6 10*3/uL — ABNORMAL LOW (ref 4.0–10.5)

## 2018-04-11 LAB — BLOOD CULTURE ID PANEL (REFLEXED)

## 2018-04-11 LAB — CG4 I-STAT (LACTIC ACID): Lactic Acid, Venous: 0.73 mmol/L (ref 0.5–1.9)

## 2018-04-11 MED ORDER — CAMPHOR-MENTHOL 0.5-0.5 % EX LOTN
TOPICAL_LOTION | CUTANEOUS | Status: DC | PRN
  Administered 2018-04-13: 21:00:00 via TOPICAL
  Filled 2018-04-11 (×3): qty 222

## 2018-04-11 MED ORDER — ONDANSETRON HCL 4 MG/2ML IJ SOLN
4.0000 mg | Freq: Four times a day (QID) | INTRAMUSCULAR | Status: DC | PRN
  Administered 2018-04-13: 4 mg via INTRAVENOUS
  Filled 2018-04-11: qty 2

## 2018-04-11 MED ORDER — SODIUM CHLORIDE 0.9 % IV SOLN
2.0000 g | Freq: Once | INTRAVENOUS | Status: AC
  Administered 2018-04-11: 2 g via INTRAVENOUS
  Filled 2018-04-11: qty 2

## 2018-04-11 MED ORDER — ONDANSETRON HCL 4 MG PO TABS
4.0000 mg | ORAL_TABLET | Freq: Four times a day (QID) | ORAL | Status: DC | PRN
  Administered 2018-04-11: 4 mg via ORAL
  Filled 2018-04-11: qty 1

## 2018-04-11 MED ORDER — ENSURE ENLIVE PO LIQD
237.0000 mL | Freq: Two times a day (BID) | ORAL | Status: DC
  Administered 2018-04-11 – 2018-04-17 (×8): 237 mL via ORAL

## 2018-04-11 MED ORDER — SODIUM CHLORIDE 0.9 % IV SOLN
2.0000 g | Freq: Two times a day (BID) | INTRAVENOUS | Status: DC
  Administered 2018-04-11 – 2018-04-18 (×13): 2 g via INTRAVENOUS
  Filled 2018-04-11 (×18): qty 2

## 2018-04-11 MED ORDER — POLYETHYLENE GLYCOL 3350 17 G PO PACK
17.0000 g | PACK | Freq: Every day | ORAL | Status: DC
  Administered 2018-04-13 – 2018-04-15 (×2): 17 g via ORAL
  Filled 2018-04-11 (×4): qty 1

## 2018-04-11 MED ORDER — DOCUSATE SODIUM 100 MG PO CAPS
100.0000 mg | ORAL_CAPSULE | Freq: Two times a day (BID) | ORAL | Status: DC
  Administered 2018-04-12 – 2018-04-15 (×5): 100 mg via ORAL
  Filled 2018-04-11 (×7): qty 1

## 2018-04-11 MED ORDER — HYDROXYZINE HCL 25 MG PO TABS
50.0000 mg | ORAL_TABLET | Freq: Four times a day (QID) | ORAL | Status: DC | PRN
  Administered 2018-04-13 – 2018-04-14 (×7): 50 mg via ORAL
  Filled 2018-04-11 (×7): qty 2

## 2018-04-11 MED ORDER — ACETAMINOPHEN 325 MG PO TABS
650.0000 mg | ORAL_TABLET | Freq: Four times a day (QID) | ORAL | Status: DC | PRN
  Administered 2018-04-11 – 2018-04-13 (×3): 650 mg via ORAL
  Filled 2018-04-11 (×3): qty 2

## 2018-04-11 MED ORDER — HYDROMORPHONE HCL 4 MG PO TABS
4.0000 mg | ORAL_TABLET | ORAL | Status: DC | PRN
  Administered 2018-04-11 – 2018-04-14 (×6): 4 mg via ORAL
  Filled 2018-04-11 (×6): qty 1

## 2018-04-11 MED ORDER — SODIUM CHLORIDE 0.9 % IV SOLN
INTRAVENOUS | Status: DC
  Administered 2018-04-11 – 2018-04-14 (×6): via INTRAVENOUS

## 2018-04-11 NOTE — Progress Notes (Signed)
[name] nd examined. Admitted after midnight secondary to UTI. Patient actively receiving chemotherapy for stage 4 bladder cancer. UA suspicious for infection. Started on Cefepime; continue IVF's, supportive care and follow urine culture results. Please refer to H&P written by Dr. Darrick Meigs for further info/details on admission. Patient is hemodynamically stable currently and with low grade temp.  Barton Dubois MD [phone]

## 2018-04-11 NOTE — H&P (Signed)
TRH H&P    Patient Demographics:    [name], is a 77 y.o. male  MRN: [id number]  DOB - 1941/02/10  Admit Date - 04/10/2018  Referring MD/NP/PA: DrRancour  Outpatient Primary MD for the patient is [name], MD  Patient coming from: Home  Chief complaint-fever   HPI:    [name]  is a 77 y.o. male, with history of metastatic bladder cancer, status post urostomy, was getting chemotherapy.  Patient received IV chemotherapy this morning and developed fever as high as 102.7.  Patient denies any chest pain, no shortness of breath.  No nausea vomiting or diarrhea.  No back pain.  He called his oncologist who recommended him to go to the ED for further evaluation. In the ED lab work revealed abnormal UA.  Patient started on IV cefepime. Lactic acid 0.73, WBC 4.7, platelet 90     Review of systems:      All other systems reviewed and are negative.   With Past History of the following :    Past Medical History:  Diagnosis Date  . Bladder cancer ([place])   . BPH (benign prostatic hypertrophy)   . Dysuria   . GERD (gastroesophageal reflux disease)   . History of adenomatous polyp of colon    tubular adenoma 06/ 2018  . History of bladder cancer 12/2014   urologist-  dr [name]--  dx High Grade TCC without stromal invasion s/p TURBT and intravesical BCG tx/  recurrent 07/2015 (pTa) TCC  repear intravesical BCG tx   . History of hiatal hernia   . History of urethral stricture   . Nocturia       Past Surgical History:  Procedure Laterality Date  . CARDIAC CATHETERIZATION  1997   normal coronary arteries  . CARDIOVASCULAR STRESS TEST  12-14-2005   Low risk perfusion study/  very small scar in the inferior septum from mid ventricle to apex,  no ischemia/  mild inferior septal hypokinesis/  ef 58%  . CATARACT EXTRACTION W/ INTRAOCULAR LENS  IMPLANT, BILATERAL  2011  . COLONOSCOPY  last one  06/ 2018  . CYSTOSCOPY WITH BIOPSY N/A 08/12/2015   Procedure: CYSTOSCOPY WITH BIOPSY AND FULGERATION;  Surgeon: [name], MD;  Location: [facility];  Service: Urology;  Laterality: N/A;  . CYSTOSCOPY WITH INJECTION N/A 06/29/2017   Procedure: CYSTOSCOPY WITH INJECTION OF INDOCYANINE GREEN DYE;  Surgeon: Alexis Frock, MD;  Location: [facility];  Service: Urology;  Laterality: N/A;  . CYSTOSCOPY WITH URETHRAL DILATATION N/A 03/21/2017   Procedure: CYSTOSCOPY;  Surgeon: [name], MD;  Location: [facility];  Service: Urology;  Laterality: N/A;  . ESOPHAGOGASTRODUODENOSCOPY  12-05-2014  . IR IMAGING GUIDED PORT INSERTION  02/07/2018  . TRANSTHORACIC ECHOCARDIOGRAM  01-31-2008   nomral LVF,  ef 55-65%/  mild pulmonic stenosis without regurg.,  peak transpulomonic valve gradient 51mmHg  . TRANSURETHRAL RESECTION OF BLADDER TUMOR N/A 12/31/2014   Procedure: TRANSURETHRAL RESECTION OF BLADDER TUMOR (TURBT);  Surgeon: Lowella Bandy, MD;  Location: [facility];  Service: Urology;  Laterality: N/A;  . TRANSURETHRAL RESECTION OF BLADDER TUMOR N/A 03/21/2017   Procedure: POSSIBLE TRANSURETHRAL RESECTION OF BLADDER TUMOR (TURBT);  Surgeon: [name], MD;  Location: [facility];  Service: Urology;  Laterality: N/A;      Social History:      Social History   Tobacco Use  . Smoking status: Former Smoker    Packs/day: 1.00    Years: 34.00    Pack years: 34.00    Types: Cigarettes    Last attempt to quit: 12/26/1988    Years since quitting: 29.3  . Smokeless tobacco: Never Used  Substance Use Topics  . Alcohol use: Yes    Alcohol/week: 6.0 standard drinks    Types: 6 Cans of beer per week    Comment: BEER       Family History :     Family History  Problem Relation Age of Onset  . Colon cancer Neg Hx   . Colon polyps Neg Hx   . Diabetes Neg Hx   . Kidney disease Neg Hx   . Esophageal cancer Neg Hx   .  Heart disease Neg Hx   . Gallbladder disease Neg Hx       Home Medications:   Prior to Admission medications   Medication Sig Start Date End Date Taking? Authorizing Provider  acetaminophen (TYLENOL) 500 MG tablet Take 1,000 mg by mouth every 6 (six) hours as needed.   Yes [provider]  HYDROmorphone (DILAUDID) 4 MG tablet Take 1 tablet (4 mg total) by mouth every 4 (four) hours as needed for severe pain. 04/03/18  Yes [name], MD  hydrOXYzine (ATARAX/VISTARIL) 50 MG tablet Take 1 tablet (50 mg total) by mouth 4 (four) times daily as needed. 04/03/18  Yes [name], MD  lidocaine-prilocaine (EMLA) cream Apply 1 application topically as needed. 01/18/18  Yes [name], MD  oxyCODONE (OXY IR/ROXICODONE) 5 MG immediate release tablet Take 1-2 tablets (5-10 mg total) by mouth every 4 (four) hours as needed for severe pain. 04/03/18  Yes [name], MD  prochlorperazine (COMPAZINE) 10 MG tablet Take 1 tablet (10 mg total) by mouth every 6 (six) hours as needed for nausea or vomiting. 01/18/18  Yes Shadad, Mathis Dad, MD  predniSONE (STERAPRED UNI-PAK 21 TAB) 10 MG (21) TBPK tablet Take 6 tablets for a day.  Take 5 tablets for a day, then Take 4 tablets for a day Take 3 tablets for a day Take 2 tablets for a day Take one tablet for day and stop. Patient not taking: Reported on 04/10/2018 03/02/18   [name], MD     Allergies:     Allergies  Allergen Reactions  . Ciprofloxacin Hives     Physical Exam:   Vitals  Blood pressure 131/75, pulse (!) 101, temperature 98 F (36.7 C), temperature source Oral, resp. rate 15, SpO2 100 %.  1.  General: Appears in no acute distress  2. Psychiatric:  Intact judgement and  insight, awake alert, oriented x 3.  3. Neurologic: No focal neurological deficits, all cranial nerves intact.Strength 5/5 all 4 extremities, sensation intact all 4 extremities, plantars down going.  4. Eyes :  anicteric sclerae, moist  conjunctivae with no lid lag. PERRLA.  5. ENMT:  Oropharynx clear with moist mucous membranes and good dentition  6. Neck:  supple, no cervical lymphadenopathy appriciated, No thyromegaly  7. Respiratory : Normal respiratory effort, good air  movement bilaterally,clear to  auscultation bilaterally  8. Cardiovascular : RRR, no gallops, rubs or murmurs, no leg edema  9. Gastrointestinal:  Positive bowel sounds, abdomen soft, non-tender to palpation,no hepatosplenomegaly, no rigidity or guarding, urostomy bag in place      10. Skin:  No cyanosis, normal texture and turgor, no rash, lesions or ulcers  11.Musculoskeletal:  Good muscle tone,  joints appear normal , no effusions,  normal range of motion    Data Review:    CBC Recent Labs  Lab 04/10/18 0816 04/10/18 2205  WBC 3.9* 4.7  HGB 9.1* 9.2*  HCT 28.9* 28.6*  PLT 98* 90*  MCV 99.3* 99.0  MCH 31.3 31.8  MCHC 31.5* 32.2  RDW 17.5* 17.3*  LYMPHSABS 0.3* 0.4*  MONOABS 0.6 0.5  EOSABS 0.0 0.0  BASOSABS 0.0 0.0   ------------------------------------------------------------------------------------------------------------------  Chemistries  Recent Labs  Lab 04/10/18 0816 04/10/18 2205  NA 136 134*  K 3.6 3.6  CL 100 100  CO2 29 25  GLUCOSE 102* 118*  BUN 13 17  CREATININE 0.66 0.80  CALCIUM 9.1 8.7*  AST 18 26  ALT 33 34  ALKPHOS 61 50  BILITOT 0.5 1.0   ------------------------------------------------------------------------------------------------------------------  ------------------------------------------------------------------------------------------------------------------ GFR: Estimated Creatinine Clearance: 82.4 mL/min (by C-G formula based on SCr of 0.8 mg/dL). Liver Function Tests: Recent Labs  Lab 04/10/18 0816 04/10/18 2205  AST 18 26  ALT 33 34  ALKPHOS 61 50  BILITOT 0.5 1.0  PROT 5.3* 5.8*  ALBUMIN 2.5* 2.8*     --------------------------------------------------------------------------------------------------------------- Urine analysis:    Component Value Date/Time   COLORURINE YELLOW 04/10/2018 2217   APPEARANCEUR HAZY (A) 04/10/2018 2217   LABSPEC 1.010 04/10/2018 2217   PHURINE 6.0 04/10/2018 2217   GLUCOSEU NEGATIVE 04/10/2018 2217   [place] NEGATIVE 04/10/2018 2217   [place] NEGATIVE 04/10/2018 2217   KETONESUR NEGATIVE 04/10/2018 2217   PROTEINUR NEGATIVE 04/10/2018 2217   NITRITE POSITIVE (A) 04/10/2018 2217   LEUKOCYTESUR LARGE (A) 04/10/2018 2217      Imaging Results:    Dg Chest 2 View  Result Date: 04/10/2018 CLINICAL DATA:  Fever. Chemotherapy today. History of metastatic bladder cancer. EXAM: CHEST - 2 VIEW COMPARISON:  CT chest March 31, 2018 in chest radiograph January 25, 2018 FINDINGS: Heart size is normal. Mediastinal silhouette is not suspicious. Known pulmonary nodules less conspicuous due to plain radiography. No pleural effusion or focal consolidation. No pneumothorax. Biapical pleural thickening. Single-lumen RIGHT chest Port-A-Cath with distal tip projecting mid superior vena cava. No pneumothorax. Soft tissue planes and included osseous structures are non suspicious. IMPRESSION: 1. No acute cardiopulmonary process. Electronically Signed   By: Elon Alas M.D.   On: 04/10/2018 22:01       Assessment & Plan:    Active Problems:   UTI (urinary tract infection)   1. UTI-patient has ab normal UA, started on cefepime in the ED.  Urine culture has been obtained.  Follow urine culture results.   2. Metastatic rectal cancer-status post urostomy, getting chemotherapy as outpatient.  3. Chronic pain syndrome-secondary to bladder cancer, continue with Dilaudid 4 mg every 4 hours PRN for pain  4. Thrombocytopenia-platelet count 90,000, secondary to chemotherapy.  Follow CBC in a.m.   DVT Prophylaxis-   SCDs  AM Labs Ordered, also please review Full  Orders  Family Communication: Admission, patients condition and plan of care including tests being ordered have been discussed with the patient and his wife at bedside* who indicate understanding and agree with the  plan and Code Status.  Code Status: Full code  Admission status: Inpatient  Time spent in minutes : 60 minutes   Oswald Hillock M.D on 04/11/2018 at 2:18 AM  Between 7am to 7pm - Pager - [phone]. After 7pm go to [email or web address] - password [facility]  [facility] - Office  [phone]

## 2018-04-11 NOTE — Progress Notes (Signed)
MD notified of positive blood cultures.  No new orders given at this time.

## 2018-04-11 NOTE — Progress Notes (Signed)
Pharmacy Antibiotic Note  [name] is a 77 y.o. male admitted on 04/10/2018 with UTI.  Pharmacy has been consulted for cefepime dosing.  Plan: Start cefepime 2g IV q12h Pharmacy will continue to monitor patient labs, cultures and progress.    Temp (24hrs), Avg:98.8 F (37.1 C), Min:98 F (36.7 C), Max:99.7 F (37.6 C)  Recent Labs  Lab 04/10/18 0816 04/10/18 2205 04/10/18 2213 04/10/18 2351 04/11/18 0458  WBC 3.9* 4.7  --   --  3.6*  CREATININE 0.66 0.80  --   --  0.69  LATICACIDVEN  --   --  0.94 0.73  --     Estimated Creatinine Clearance: 82.4 mL/min (by C-G formula based on SCr of 0.69 mg/dL).    Allergies  Allergen Reactions  . Ciprofloxacin Hives    Antimicrobials this admission: 8/27 cefepime >>   Microbiology results: 8-26 BCx2:  NG <12h 8/26 UCx:    Thank you for allowing pharmacy to participate in the care of this patient.  Despina Pole, Pharm. D. Clinical Pharmacist 04/11/2018 8:12 AM

## 2018-04-11 NOTE — Progress Notes (Signed)
Initial Nutrition Assessment  DOCUMENTATION CODES:  Not applicable  INTERVENTION:  Ensure Enlive po BID, each supplement provides 350 kcal and 20 grams of protein  Ice cream with Lunch and Dinner.   Patient reports uncontrolled pain. Notes he takes 4 mg hydromorphone q4 PRN  and 5mg  oxycodone q4 PRN  Education provided regarding oncologic nutrition therapy  NUTRITION DIAGNOSIS:  Increased nutrient needs related to cancer and cancer related treatments as evidenced by estimated nutritional requirements for this disease state.   GOAL:  Patient will meet greater than or equal to 90% of their needs  MONITOR:  PO intake, Supplement acceptance, Labs, Weight trends, I & O's  REASON FOR ASSESSMENT:  Malnutrition Screening Tool    ASSESSMENT:  77 y/o male PMHx Stage IV bladder cancer s/p urostomy. Initially diagnosed August 2018. Developed metastatic disease in April 2019 w/ pulmonary/pubus ramus metastases. On chemotherapy. Presented w/ fever and fatigue following a chemotherapy infusion. Referred to ED by oncology. Worked up for UTI and admitted for management.   Patient seen with other individual at bedside. He says his appetite has not been as good recently as he is in the midst of a chemo cycle. He says this normally happens with his chemotherapy. He reports severe fatigue, poor appetite and weakness following his treatments. He actually says it hits him so hard he is not even able to walk for 24 hrs afterwards due to weakness. He says his Oncologist was debating changing his chemo, but pt says "im trying to stay with it". He says loses 2-3 lbs each chemotherapy, "I expect to be 170's when its said and done".  He typically has constipation, but this is currently well managed. Denies any n/v/d. Patient does report some uncontrolled pain, but he says the nurse is already working to address this. He says he controls pain well with 1 hydromorphone tab/day and 2 separate doses of  oxycodone/day.  At baseline, he does not take any vitamins, but he does drink 3-4 high protein Boost supplements/day. He does not follow any type of diet.   Wt wise, he reports his weight prior to starting chemotherapy (01/2017) was 235. Per review of chart, he was 197 lbs in July 2018, but no history showing he was 235 lbs. Todays bed weight is >190, though did not remove all items from bed and he has received IVF. Per chart, pt was ~192 lbs at this time last year. He appears to have done a fairly good job maintaining weight up until the past month or so. He was 192.4 early July, 186.7 and 183.9 mid August.   He is documented as eating 50% of his breakfast this morning and reports that he ate 50% of his Lunch.   RD spent the majority of encounter counseling patient on importance of nutrition in cancer treatment. He seems to have simply accepted he will lose weight and is not motivated to try to prevent it. Reviewed how adequate nutrition improves outcomes. His relative at bedside says he routinely encourages patient to eat better.   Reviewed importance of nutrition in acute infection. RD recommended Ensure given his eating only 50% meals. He was agreeable. He also would accept ice cream on tray (has had magic cup before and doesn't like).  Physical Exam deferred for privacy as another individual present. Will perform on F/U. Visually, does appear to have mild degree of wasting in various areas.   Labs: Albumin:2.8, H/H:9.2/28.6 Meds: Ensure Enlive, Colace,miralax IV abx  Recent Labs  Lab 04/10/18  [id number] 04/10/18 2205 04/11/18 0458  NA 136 134* 136  K 3.6 3.6 3.5  CL 100 100 104  CO2 29 25 25   BUN 13 17 14   CREATININE 0.66 0.80 0.69  CALCIUM 9.1 8.7* 8.1*  GLUCOSE 102* 118* 131*   NUTRITION - FOCUSED PHYSICAL EXAM: Deferred at this time  Diet Order:   Diet Order            Diet regular Room service appropriate? Yes; Fluid consistency: Thin  Diet effective now              EDUCATION NEEDS:  Education needs have been addressed  Skin:  Skin Assessment: Reviewed RN Assessment  Last BM:  8/24  Height:  Ht Readings from Last 1 Encounters:  04/11/18 5\' 11"  (1.803 m)   Weight:  Wt Readings from Last 1 Encounters:  04/11/18 88 kg   Wt Readings from Last 10 Encounters:  04/11/18 88 kg  04/03/18 83.4 kg  03/13/18 84.7 kg  02/20/18 87.3 kg  02/07/18 85.7 kg  02/07/18 85.7 kg  02/06/18 85.5 kg  01/30/18 87.6 kg  01/25/18 90.2 kg  01/18/18 89.4 kg   Ideal Body Weight:  78.18 kg  BMI:  Body mass index is 27.06 kg/m.  Estimated Nutritional Needs:  Kcal:  2200-2400 (25-27 kcal/kg bw) Protein:  97-114 (1.1-1.3g/kw bw) Fluid:  >2.2 L fluid (25 ml/kg bw)  Burtis Junes RD, LDN, CNSC Clinical Nutrition Available Tues-Sat via Pager: [phone] 04/11/2018 2:50 PM

## 2018-04-11 NOTE — Progress Notes (Signed)
ANTIBIOTIC CONSULT NOTE-Preliminary  Pharmacy Consult for cefepime Indication: UTI  Allergies  Allergen Reactions  . Ciprofloxacin Hives    Patient Measurements:   Adjusted Body Weight:   Vital Signs: Temp: 99.7 F (37.6 C) (08/27 0538) Temp Source: Oral (08/27 0538) BP: 92/55 (08/27 0538) Pulse Rate: 104 (08/27 0538)  Labs: Recent Labs    04/10/18 0816 04/10/18 2205  WBC 3.9* 4.7  HGB 9.1* 9.2*  PLT 98* 90*  CREATININE 0.66 0.80    Estimated Creatinine Clearance: 82.4 mL/min (by C-G formula based on SCr of 0.8 mg/dL).  No results for input(s): VANCOTROUGH, VANCOPEAK, VANCORANDOM, GENTTROUGH, GENTPEAK, GENTRANDOM, TOBRATROUGH, TOBRAPEAK, TOBRARND, AMIKACINPEAK, AMIKACINTROU, AMIKACIN in the last 72 hours.   Microbiology: Recent Results (from the past 720 hour(s))  Blood culture (routine x 2)     Status: None (Preliminary result)   Collection Time: 04/10/18 11:06 PM  Result Value Ref Range Status   Specimen Description LEFT ANTECUBITAL  Final   Special Requests   Final    BOTTLES DRAWN AEROBIC AND ANAEROBIC Blood Culture adequate volume Performed at [facility], [address] [place], [place] [address]    Culture PENDING  Incomplete   Report Status PENDING  Incomplete  Blood culture (routine x 2)     Status: None (Preliminary result)   Collection Time: 04/10/18 11:40 PM  Result Value Ref Range Status   Specimen Description BLOOD LEFT HAND  Final   Special Requests   Final    BOTTLES DRAWN AEROBIC ONLY Blood Culture adequate volume Performed at [name], [address], [place], [place] [address]    Culture PENDING  Incomplete   Report Status PENDING  Incomplete    Medical History: Past Medical History:  Diagnosis Date  . Bladder cancer ([place])   . BPH (benign prostatic hypertrophy)   . Dysuria   . GERD (gastroesophageal reflux disease)   . History of adenomatous polyp of colon    tubular adenoma 06/ 2018  . History of bladder cancer 12/2014   urologist-  dr [name]--  dx High Grade TCC without stromal invasion s/p TURBT and intravesical BCG tx/  recurrent 07/2015 (pTa) TCC  repear intravesical BCG tx   . History of hiatal hernia   . History of urethral stricture   . Nocturia     Medications:  Scheduled:  . feeding supplement (ENSURE ENLIVE)  237 mL Oral BID BM   Infusions:  . sodium chloride 75 mL/hr at 04/11/18 0321  . ceFEPime (MAXIPIME) IV     Anti-infectives (From admission, onward)   Start     Dose/Rate Route Frequency Ordered Stop   04/11/18 0800  ceFEPIme (MAXIPIME) 2 g in sodium chloride 0.9 % 100 mL IVPB     2 g 200 mL/hr over 30 Minutes Intravenous  Once 04/11/18 0556     04/10/18 2315  ceFEPIme (MAXIPIME) 2 g in sodium chloride 0.9 % 100 mL IVPB     2 g 200 mL/hr over 30 Minutes Intravenous  Once 04/10/18 2309 04/11/18 0025      Assessment: 77 yo male undergoing chemotherapy for metastatic bladder cancer.  Developed fever after IV chemo this morning and came to ED.  Abnormal UA, now starting cefepime for UTI.     Plan:  Preliminary review of pertinent patient information completed.  Protocol will be initiated with dose(s) of cefepime 2 grams at 0800, 8 hours after initial dose in ED.  Forestine Na clinical pharmacist will complete review during morning rounds to assess  patient and finalize treatment regimen if needed.  Sharif Rendell Scarlett, RPH 04/11/2018,5:56 AM

## 2018-04-12 DIAGNOSIS — Z936 Other artificial openings of urinary tract status: Secondary | ICD-10-CM

## 2018-04-12 DIAGNOSIS — R6510 Systemic inflammatory response syndrome (SIRS) of non-infectious origin without acute organ dysfunction: Secondary | ICD-10-CM

## 2018-04-12 DIAGNOSIS — C679 Malignant neoplasm of bladder, unspecified: Secondary | ICD-10-CM

## 2018-04-12 DIAGNOSIS — D61818 Other pancytopenia: Secondary | ICD-10-CM

## 2018-04-12 DIAGNOSIS — R7881 Bacteremia: Secondary | ICD-10-CM

## 2018-04-12 DIAGNOSIS — C7951 Secondary malignant neoplasm of bone: Secondary | ICD-10-CM

## 2018-04-12 DIAGNOSIS — Z9221 Personal history of antineoplastic chemotherapy: Secondary | ICD-10-CM

## 2018-04-12 LAB — BASIC METABOLIC PANEL
Anion gap: 7 (ref 5–15)
BUN: 11 mg/dL (ref 8–23)
CO2: 27 mmol/L (ref 22–32)
Calcium: 8.1 mg/dL — ABNORMAL LOW (ref 8.9–10.3)
Chloride: 104 mmol/L (ref 98–111)
Creatinine, Ser: 0.62 mg/dL (ref 0.61–1.24)
GFR calc Af Amer: >60 mL/min (ref >60)
GFR calc non Af Amer: >60 mL/min (ref >60)
Glucose, Bld: 119 mg/dL — ABNORMAL HIGH (ref 70–99)
Potassium: 3.7 mmol/L (ref 3.5–5.1)
Sodium: 138 mmol/L (ref 135–145)

## 2018-04-12 LAB — CBC
HCT: 22.7 % — ABNORMAL LOW (ref 39.0–52.0)
Hemoglobin: 7.3 g/dL — ABNORMAL LOW (ref 13.0–17.0)
MCH: 32 pg (ref 26.0–34.0)
MCHC: 32.2 g/dL (ref 30.0–36.0)
MCV: 99.6 fL (ref 78.0–100.0)
Platelets: 49 10*3/uL — ABNORMAL LOW (ref 150–400)
RBC: 2.28 MIL/uL — ABNORMAL LOW (ref 4.22–5.81)
RDW: 17 % — ABNORMAL HIGH (ref 11.5–15.5)
WBC: 2.1 10*3/uL — ABNORMAL LOW (ref 4.0–10.5)

## 2018-04-12 NOTE — Progress Notes (Signed)
PROGRESS NOTE  [name] VQQ:[id number] DOB: [birth year] DOA: 04/10/2018 PCP: [name], MD  Brief History:  77 year old male with a history of stage IV bladder cancer/urothelial carcinoma with pulmonary and pelvic metastasis, GERD presents with a fever of 1 2.7 F that developed around 7 PM on 04/10/2018.  The patient last received chemotherapy on the morning of 04/10/2018.  He underwent a cystoscopy prostatectomy on 06/19/2017 with ileal conduit.  Currently on Carboplatin and gemcitabine cycle 4 managed by Dr. Alen Blew.   He did not receive day 8 of the last cycle of chemotherapy because of pancytopenia.  Upon presentation, the patient was noted to have WBC 4.7 with low-grade temperature 99.7 F and tachycardia.  UA showed 21-50 WBC.  Lactic acid was 0.73.  The patient was started empirically on cefepime.  Assessment/Plan: Fever/SIRS -Last chemotherapy 04/10/2018 -Continue empiric cefepime -Source may be bacteremia -Urinalysis with pyuria, but sample comes from ileal conduit -still running fever but trending down  -personally reviewed CXR--no infiltrates -continue IVF  Bacteremia -Preliminary BC ID shows Streptococcus species -Continue cefepime empirically pending final culture data  Pyuria -significance is unclear presently as sample is obtained from ileal conduit  Metastatic urothelial carcinoma -Managed by Dr. Alen Blew -Currently on Carboplatin and gemcitabine cycle 4   Pancytopenia -related to recent chemo -am CBC with diff -SCDs  Cancer associated pain -Continue home dose of hydromorphone  Fixed drug reaction -?related to chemo agents -sarna prn -benadryl prn itch    Disposition Plan:   Home in 2-3 days  Family Communication:   Spouse updated at bedside  Consultants:none    Code Status:  FULL  DVT Prophylaxis:  [place] Heparin / [place] Lovenox   Procedures: As Listed in Progress Note Above  Antibiotics: Cefepime 8/27>>>    Subjective: Patient  denies fevers, chills, headache, chest pain, dyspnea, nausea, vomiting, diarrhea, abdominal pain, dysuria, hematuria, hematochezia, and melena.   Objective: Vitals:   04/11/18 2008 04/11/18 2131 04/12/18 0532 04/12/18 0930  BP:  (!) 97/48 131/70   Pulse:  93 97   Resp:  18 18   Temp: 99.3 F (37.4 C) 98.8 F (37.1 C) 99 F (37.2 C) 98.1 F (36.7 C)  TempSrc:  Oral Oral Oral  SpO2:  98% 99%   Weight:      Height:        Intake/Output Summary (Last 24 hours) at 04/12/2018 1041 Last data filed at 04/12/2018 0800 Gross per 24 hour  Intake 3665 ml  Output 1150 ml  Net 2515 ml   Weight change:  Exam:   General:  Pt is alert, follows commands appropriately, not in acute distress  HEENT: No icterus, No thrush, No neck mass, [place]/AT  Cardiovascular: RRR, S1/S2, no rubs, no gallops  Respiratory: CTA bilaterally, no wheezing, no crackles, no rhonchi  Abdomen: Soft/+BS, non tender, non distended, no guarding  Extremities: No edema, No lymphangitis, No petechiae, No rashes, no synovitis; salmon colored maculopapular rash bilateral flanks up to axillae   Data Reviewed: I have personally reviewed following labs and imaging studies Basic Metabolic Panel: Recent Labs  Lab 04/10/18 0816 04/10/18 2205 04/11/18 0458 04/12/18 0518  NA 136 134* 136 138  K 3.6 3.6 3.5 3.7  CL 100 100 104 104  CO2 29 25 25 27   GLUCOSE 102* 118* 131* 119*  BUN 13 17 14 11   CREATININE 0.66 0.80 0.69 0.62  CALCIUM 9.1 8.7* 8.1* 8.1*   Liver Function Tests:  Recent Labs  Lab 04/10/18 0816 04/10/18 2205 04/11/18 0458  AST 18 26 26   ALT 33 34 29  ALKPHOS 61 50 44  BILITOT 0.5 1.0 0.5  PROT 5.3* 5.8* 4.9*  ALBUMIN 2.5* 2.8* 2.3*   No results for input(s): LIPASE, AMYLASE in the last 168 hours. No results for input(s): AMMONIA in the last 168 hours. Coagulation Profile: No results for input(s): INR, PROTIME in the last 168 hours. CBC: Recent Labs  Lab 04/10/18 0816 04/10/18 2205  04/11/18 0458 04/12/18 0518  WBC 3.9* 4.7 3.6* 2.1*  NEUTROABS 3.0 3.8  --   --   HGB 9.1* 9.2* 7.7* 7.3*  HCT 28.9* 28.6* 24.5* 22.7*  MCV 99.3* 99.0 100.4* 99.6  PLT 98* 90* 69* 49*   Cardiac Enzymes: No results for input(s): CKTOTAL, CKMB, CKMBINDEX, TROPONINI in the last 168 hours. BNP: Invalid input(s): POCBNP CBG: No results for input(s): GLUCAP in the last 168 hours. HbA1C: No results for input(s): HGBA1C in the last 72 hours. Urine analysis:    Component Value Date/Time   COLORURINE YELLOW 04/10/2018 2217   APPEARANCEUR HAZY (A) 04/10/2018 2217   LABSPEC 1.010 04/10/2018 2217   PHURINE 6.0 04/10/2018 2217   GLUCOSEU NEGATIVE 04/10/2018 2217   HGBUR NEGATIVE 04/10/2018 2217   [place] NEGATIVE 04/10/2018 2217   KETONESUR NEGATIVE 04/10/2018 2217   PROTEINUR NEGATIVE 04/10/2018 2217   NITRITE POSITIVE (A) 04/10/2018 2217   LEUKOCYTESUR LARGE (A) 04/10/2018 2217   Sepsis Labs: @LABRCNTIP (procalcitonin:4,lacticidven:4) ) Recent Results (from the past 240 hour(s))  Blood culture (routine x 2)     Status: None (Preliminary result)   Collection Time: 04/10/18 11:06 PM  Result Value Ref Range Status   Specimen Description   Final    LEFT ANTECUBITAL Performed at [facility], [address], [place], [place] [address]    Special Requests   Final    BOTTLES DRAWN AEROBIC AND ANAEROBIC Blood Culture adequate volume Performed at [place] [place], [place] [address]    Culture  Setup Time   Final    GRAM POSITIVE COCCI ANAEROBIC BOTTLE Gram Stain Report Called to,Read Back By and Verified With: ALSTON C. AT 1505 ON [id number] BY THOMPSON S. CRITICAL RESULT CALLED TO, READ BACK BY AND VERIFIED WITH: C ALSTON RN 04/11/18 1817 JDW    Culture   Final    GRAM POSITIVE COCCI IDENTIFICATION TO FOLLOW Performed at [facility], [place] [address] [place],  [address]    Report Status PENDING  Incomplete  Blood Culture ID Panel (Reflexed)      Status: Abnormal   Collection Time: 04/10/18 11:06 PM  Result Value Ref Range Status   Enterococcus species NOT DETECTED NOT DETECTED Final   Listeria monocytogenes NOT DETECTED NOT DETECTED Final   Staphylococcus species NOT DETECTED NOT DETECTED Final   Staphylococcus aureus NOT DETECTED NOT DETECTED Final   Streptococcus species DETECTED (A) NOT DETECTED Final    Comment: Not Enterococcus species, Streptococcus agalactiae, Streptococcus pyogenes, or Streptococcus pneumoniae. CRITICAL RESULT CALLED TO, READ BACK BY AND VERIFIED WITH: C ALSTON RN 04/11/18 1817 JDW    Streptococcus agalactiae NOT DETECTED NOT DETECTED Final   Streptococcus pneumoniae NOT DETECTED NOT DETECTED Final   Streptococcus pyogenes NOT DETECTED NOT DETECTED Final   Acinetobacter baumannii NOT DETECTED NOT DETECTED Final   Enterobacteriaceae species NOT DETECTED NOT DETECTED Final   Enterobacter cloacae complex NOT DETECTED NOT DETECTED Final   Escherichia coli NOT DETECTED NOT DETECTED Final  Klebsiella oxytoca NOT DETECTED NOT DETECTED Final   Klebsiella pneumoniae NOT DETECTED NOT DETECTED Final   Proteus species NOT DETECTED NOT DETECTED Final   Serratia marcescens NOT DETECTED NOT DETECTED Final   Haemophilus influenzae NOT DETECTED NOT DETECTED Final   Neisseria meningitidis NOT DETECTED NOT DETECTED Final   Pseudomonas aeruginosa NOT DETECTED NOT DETECTED Final   Candida albicans NOT DETECTED NOT DETECTED Final   Candida glabrata NOT DETECTED NOT DETECTED Final   Candida krusei NOT DETECTED NOT DETECTED Final   Candida parapsilosis NOT DETECTED NOT DETECTED Final   Candida tropicalis NOT DETECTED NOT DETECTED Final    Comment: Performed at [facility], [place] [address] [place], [place] [address]  Blood culture (routine x 2)     Status: None (Preliminary result)   Collection Time: 04/10/18 11:40 PM  Result Value Ref Range Status   Specimen Description BLOOD LEFT HAND  Final   Special Requests    Final    BOTTLES DRAWN AEROBIC ONLY Blood Culture adequate volume   Culture   Final    NO GROWTH 2 DAYS Performed at [facility], [address], [place], [place] [address]    Report Status PENDING  Incomplete     Scheduled Meds: . docusate sodium  100 mg Oral BID  . feeding supplement (ENSURE ENLIVE)  237 mL Oral BID BM  . polyethylene glycol  17 g Oral Daily   Continuous Infusions: . sodium chloride 75 mL/hr at 04/12/18 1011  . ceFEPime (MAXIPIME) IV 2 g (04/12/18 0911)    Procedures/Studies: Dg Chest 2 View  Result Date: 04/10/2018 CLINICAL DATA:  Fever. Chemotherapy today. History of metastatic bladder cancer. EXAM: CHEST - 2 VIEW COMPARISON:  CT chest March 31, 2018 in chest radiograph January 25, 2018 FINDINGS: Heart size is normal. Mediastinal silhouette is not suspicious. Known pulmonary nodules less conspicuous due to plain radiography. No pleural effusion or focal consolidation. No pneumothorax. Biapical pleural thickening. Single-lumen RIGHT chest Port-A-Cath with distal tip projecting mid superior vena cava. No pneumothorax. Soft tissue planes and included osseous structures are non suspicious. IMPRESSION: 1. No acute cardiopulmonary process. Electronically Signed   By: Elon Alas M.D.   On: 04/10/2018 22:01   Ct Chest W Contrast  Result Date: 03/31/2018 CLINICAL DATA:  Bladder cancer with cystectomy in 2016. Right hip metastasis. EXAM: CT CHEST, ABDOMEN, AND PELVIS WITH CONTRAST TECHNIQUE: Multidetector CT imaging of the chest, abdomen and pelvis was performed following the standard protocol during bolus administration of intravenous contrast. CONTRAST:  130mL OMNIPAQUE IOHEXOL 300 MG/ML  SOLN COMPARISON:  Chest CT 01/03/2018.  Abdominopelvic CT 11/25/2017. FINDINGS: CT CHEST FINDINGS Cardiovascular: A right Port-A-Cath which terminates at the low SVC. Aortic and branch vessel atherosclerosis. Normal heart size, without pericardial effusion. Multivessel coronary artery  atherosclerosis. Mediastinum/Nodes: No supraclavicular adenopathy. No mediastinal or hilar adenopathy. Tiny hiatal hernia. Lungs/Pleura: No pleural fluid. Focal right-sided pleural thickening medially is similar. Moderate centrilobular emphysema with lower lobe predominant bronchial wall thickening. Bilateral pulmonary nodules. Index left upper lobe pulmonary nodule measures 12 x 12 mm on 41/6 and is unchanged. Index anterior right upper lobe pulmonary nodule measures 8 x 5 mm on 63/6 and is slightly decreased from 9 x 8 mm on the prior. The remainder of the smaller pulmonary nodules are similar. Musculoskeletal: No acute osseous abnormality. CT ABDOMEN PELVIS FINDINGS Hepatobiliary: Bilateral hepatic cysts. Normal gallbladder, without biliary ductal dilatation. Pancreas: Pancreatic parenchymal versus vascular calcifications adjacent the common duct within the head.  No duct dilatation or acute inflammation. Spleen: Normal in size, without focal abnormality. Adrenals/Urinary Tract: Normal adrenal glands. Favor early contrast excretion in the left kidney, given absence of stones on prior noncontrast exam. Too small to characterize lesions in both kidneys. Good renal collecting system opacification on delayed images. Suboptimal proximal to mid right ureteric opacification on delayed images. No ureteric filling defect identified. Cystectomy and ileal conduit creation, without acute complication. Stomach/Bowel: Normal remainder of the stomach. Otherwise normal large and small bowel. Vascular/Lymphatic: Aortic and branch vessel atherosclerosis. Multiple renal arteries bilaterally. No abdominopelvic adenopathy. Reproductive: Prostatectomy. Other: No significant free fluid. Musculoskeletal: Right inferior pubic ramus lytic lesion with soft tissue component. Measures on the order of 5.2 x 5.1 cm on 92/9. Compare 4.0 by 3.5 cm on the prior exam (when remeasured). IMPRESSION: 1. Redemonstration of bilateral pulmonary  metastasis. The majority of nodules are similar in size. One index nodule has minimally decreased. 2. Progression of right inferior pubic ramus osseous metastasis. 3. No new sites of soft tissue metastasis identified. 4. Cystoprostatectomy, as before. 5. Aortic atherosclerosis (ICD10-I70.0), coronary artery atherosclerosis and emphysema (ICD10-J43.9). Electronically Signed   By: Abigail Miyamoto M.D.   On: 03/31/2018 11:57   Ct Abdomen Pelvis W Contrast  Result Date: 03/31/2018 CLINICAL DATA:  Bladder cancer with cystectomy in 2016. Right hip metastasis. EXAM: CT CHEST, ABDOMEN, AND PELVIS WITH CONTRAST TECHNIQUE: Multidetector CT imaging of the chest, abdomen and pelvis was performed following the standard protocol during bolus administration of intravenous contrast. CONTRAST:  164mL OMNIPAQUE IOHEXOL 300 MG/ML  SOLN COMPARISON:  Chest CT 01/03/2018.  Abdominopelvic CT 11/25/2017. FINDINGS: CT CHEST FINDINGS Cardiovascular: A right Port-A-Cath which terminates at the low SVC. Aortic and branch vessel atherosclerosis. Normal heart size, without pericardial effusion. Multivessel coronary artery atherosclerosis. Mediastinum/Nodes: No supraclavicular adenopathy. No mediastinal or hilar adenopathy. Tiny hiatal hernia. Lungs/Pleura: No pleural fluid. Focal right-sided pleural thickening medially is similar. Moderate centrilobular emphysema with lower lobe predominant bronchial wall thickening. Bilateral pulmonary nodules. Index left upper lobe pulmonary nodule measures 12 x 12 mm on 41/6 and is unchanged. Index anterior right upper lobe pulmonary nodule measures 8 x 5 mm on 63/6 and is slightly decreased from 9 x 8 mm on the prior. The remainder of the smaller pulmonary nodules are similar. Musculoskeletal: No acute osseous abnormality. CT ABDOMEN PELVIS FINDINGS Hepatobiliary: Bilateral hepatic cysts. Normal gallbladder, without biliary ductal dilatation. Pancreas: Pancreatic parenchymal versus vascular calcifications  adjacent the common duct within the head. No duct dilatation or acute inflammation. Spleen: Normal in size, without focal abnormality. Adrenals/Urinary Tract: Normal adrenal glands. Favor early contrast excretion in the left kidney, given absence of stones on prior noncontrast exam. Too small to characterize lesions in both kidneys. Good renal collecting system opacification on delayed images. Suboptimal proximal to mid right ureteric opacification on delayed images. No ureteric filling defect identified. Cystectomy and ileal conduit creation, without acute complication. Stomach/Bowel: Normal remainder of the stomach. Otherwise normal large and small bowel. Vascular/Lymphatic: Aortic and branch vessel atherosclerosis. Multiple renal arteries bilaterally. No abdominopelvic adenopathy. Reproductive: Prostatectomy. Other: No significant free fluid. Musculoskeletal: Right inferior pubic ramus lytic lesion with soft tissue component. Measures on the order of 5.2 x 5.1 cm on 92/9. Compare 4.0 by 3.5 cm on the prior exam (when remeasured). IMPRESSION: 1. Redemonstration of bilateral pulmonary metastasis. The majority of nodules are similar in size. One index nodule has minimally decreased. 2. Progression of right inferior pubic ramus osseous metastasis. 3. No new sites of soft  tissue metastasis identified. 4. Cystoprostatectomy, as before. 5. Aortic atherosclerosis (ICD10-I70.0), coronary artery atherosclerosis and emphysema (ICD10-J43.9). Electronically Signed   By: Abigail Miyamoto M.D.   On: 03/31/2018 11:57    Orson Eva, DO  [facility] Pager [phone]  If 7PM-7AM, please contact night-coverage [email or web address] Password [email or web address] 04/12/2018, 10:41 AM   LOS: 1 day

## 2018-04-13 DIAGNOSIS — A4189 Other specified sepsis: Secondary | ICD-10-CM

## 2018-04-13 DIAGNOSIS — D709 Neutropenia, unspecified: Secondary | ICD-10-CM

## 2018-04-13 DIAGNOSIS — R5081 Fever presenting with conditions classified elsewhere: Secondary | ICD-10-CM

## 2018-04-13 LAB — BASIC METABOLIC PANEL
Anion gap: 8 (ref 5–15)
BUN: 11 mg/dL (ref 8–23)
CO2: 26 mmol/L (ref 22–32)
Calcium: 8.3 mg/dL — ABNORMAL LOW (ref 8.9–10.3)
Chloride: 106 mmol/L (ref 98–111)
Creatinine, Ser: 0.61 mg/dL (ref 0.61–1.24)
GFR calc Af Amer: >60 mL/min (ref >60)
GFR calc non Af Amer: >60 mL/min (ref >60)
Glucose, Bld: 94 mg/dL (ref 70–99)
Potassium: 3.8 mmol/L (ref 3.5–5.1)
Sodium: 140 mmol/L (ref 135–145)

## 2018-04-13 LAB — CULTURE, BLOOD (ROUTINE X 2): Special Requests: ADEQUATE

## 2018-04-13 LAB — URINE CULTURE: Culture: >=100000 — AB

## 2018-04-13 LAB — CBC WITH DIFFERENTIAL/PLATELET
Basophils Absolute: 0 10*3/uL (ref 0.0–0.1)
Basophils Relative: 2 %
Eosinophils Absolute: 0 10*3/uL (ref 0.0–0.7)
Eosinophils Relative: 3 %
HCT: 23.3 % — ABNORMAL LOW (ref 39.0–52.0)
Hemoglobin: 7.4 g/dL — ABNORMAL LOW (ref 13.0–17.0)
Lymphocytes Relative: 16 %
Lymphs Abs: 0.2 10*3/uL — ABNORMAL LOW (ref 0.7–4.0)
MCH: 31.9 pg (ref 26.0–34.0)
MCHC: 31.8 g/dL (ref 30.0–36.0)
MCV: 100.4 fL — ABNORMAL HIGH (ref 78.0–100.0)
Monocytes Absolute: 0 10*3/uL — ABNORMAL LOW (ref 0.1–1.0)
Monocytes Relative: 2 %
Neutro Abs: 0.9 10*3/uL — ABNORMAL LOW (ref 1.7–7.7)
Neutrophils Relative %: 77 %
Platelets: 43 10*3/uL — ABNORMAL LOW (ref 150–400)
RBC: 2.32 MIL/uL — ABNORMAL LOW (ref 4.22–5.81)
RDW: 16.7 % — ABNORMAL HIGH (ref 11.5–15.5)
WBC: 1.2 10*3/uL — CL (ref 4.0–10.5)

## 2018-04-13 LAB — PROTIME-INR
INR: 0.96
Prothrombin Time: 12.7 seconds (ref 11.4–15.2)

## 2018-04-13 MED ORDER — ALUM & MAG HYDROXIDE-SIMETH 200-200-20 MG/5ML PO SUSP
30.0000 mL | Freq: Four times a day (QID) | ORAL | Status: DC | PRN
  Administered 2018-04-13: 30 mL via ORAL
  Filled 2018-04-13: qty 30

## 2018-04-13 MED ORDER — TBO-FILGRASTIM 480 MCG/0.8ML ~~LOC~~ SOSY: 480.0000 ug | PREFILLED_SYRINGE | Freq: Every day | SUBCUTANEOUS | Status: DC

## 2018-04-13 MED ORDER — FILGRASTIM 480 MCG/1.6ML IJ SOLN
480.0000 ug | Freq: Every day | INTRAMUSCULAR | Status: DC
  Administered 2018-04-13: 480 ug via SUBCUTANEOUS
  Filled 2018-04-13 (×5): qty 1.6

## 2018-04-13 NOTE — Progress Notes (Signed)
IR consulted for Port-A-Cath removal at the request of Dr. Carles Collet.  RN aware to have [facility] transportation arranged for patient to arrive at [facility] [place] tomorrow morning (8/30).  Patient to be NPO after midnight.  INR ordered.   Brynda Greathouse, MS RD PA-C 2:39 PM

## 2018-04-13 NOTE — Progress Notes (Signed)
PROGRESS NOTE  [name] QTM:[id number] DOB: Mar 02, 1941 DOA: 04/10/2018 PCP: [name], MD  Brief History:  77 year old male with a history of stage IV bladder cancer/urothelial carcinoma with pulmonary and pelvic metastasis, GERD presents with a fever of 1 2.7 F that developed around 7 PM on 04/10/2018.  The patient last received chemotherapy on the morning of 04/10/2018.  He underwent a cystoscopy prostatectomy on 06/19/2017 with ileal conduit.  Currently on Carboplatin and gemcitabine cycle 4 managed by Dr. Alen Blew.  He did not receive day 8 of the last cycle of chemotherapy because of pancytopenia.  Upon presentation, the patient was noted to have WBC 4.7 with low-grade temperature 99.7 F and tachycardia.  UA showed 21-50 WBC.  Lactic acid was 0.73.  The patient was started empirically on cefepime.  Assessment/Plan:  Febrile Neutropenia/SIRS -Last chemotherapy 04/10/2018 -Continue cefepime -Source is Rothia bacteremia -Urinalysis with pyuria, but sample comes from ileal conduit -now afebrile x 24 hours -personally reviewed CXR--no infiltrates -start granix  Bacteremia--Roth[place] -source likely port-a-cath -Continue cefepime -long discussion with pt and spouse regarding risk of recurrent bacteremia with future chemotherapy if port-a-cath is retained -they have agreed to have port-a-cath removed -case discussed with Dr. Marcello Fennel Bacteruria -significance is unclear presently as sample is obtained from ileal conduit  Metastatic urothelial carcinoma -Managed by Dr. Alen Blew -Currently on Carboplatin and gemcitabine cycle 4   Pancytopenia -related to recent chemo -am CBC with diff -SCDs  Cancer associated pain -Continue home dose of hydromorphone  Fixed drug reaction -?related to chemo agents -sarna prn -benadryl prn itch    Disposition Plan:   Home in 2-3 days  Family Communication:   Spouse updated at bedside--Total time spent 40 minutes.   Greater than 50% spent face to face counseling and coordinating care.   Consultants:none    Code Status:  FULL  DVT Prophylaxis:  [place] Heparin /  Lovenox   Procedures: As Listed in Progress Note Above  Antibiotics: Cefepime 8/27>>>  Subjective: Pt c/o generalized weakness.  Otherwise, Patient denies fevers, chills, headache, chest pain, dyspnea, nausea, vomiting, diarrhea, abdominal pain, dysuria, hematuria, hematochezia, and melena.   Objective: Vitals:   04/12/18 1405 04/12/18 2106 04/12/18 2210 04/13/18 0513  BP: (!) 109/56 116/67  (!) 148/81  Pulse: 93 (!) 102  94  Resp: 18 20  18   Temp: 98.9 F (37.2 C) 99.2 F (37.3 C)  98.3 F (36.8 C)  TempSrc: Oral Oral  Oral  SpO2: 100% 99% 99% 99%  Weight:      Height:        Intake/Output Summary (Last 24 hours) at 04/13/2018 1345 Last data filed at 04/13/2018 0513 Gross per 24 hour  Intake 1028.75 ml  Output 1002 ml  Net 26.75 ml   Weight change:  Exam:   General:  Pt is alert, follows commands appropriately, not in acute distress  HEENT: No icterus, No thrush, No neck mass, [place]/AT  Cardiovascular: RRR, S1/S2, no rubs, no gallops  Respiratory: CTA bilaterally, no wheezing, no crackles, no rhonchi  Abdomen: Soft/+BS, non tender, non distended, no guarding  Extremities: No edema, No lymphangitis, No petechiae, No rashes, no synovitis   Data Reviewed: I have personally reviewed following labs and imaging studies Basic Metabolic Panel: Recent Labs  Lab 04/10/18 0816 04/10/18 2205 04/11/18 0458 04/12/18 0518 04/13/18 0537  NA 136 134* 136 138 140  K 3.6 3.6 3.5 3.7 3.8  CL 100 100 104  104 106  CO2 29 25 25 27 26   GLUCOSE 102* 118* 131* 119* 94  BUN 13 17 14 11 11   CREATININE 0.66 0.80 0.69 0.62 0.61  CALCIUM 9.1 8.7* 8.1* 8.1* 8.3*   Liver Function Tests: Recent Labs  Lab 04/10/18 0816 04/10/18 2205 04/11/18 0458  AST 18 26 26   ALT 33 34 29  ALKPHOS 61 50 44  BILITOT 0.5 1.0 0.5  PROT  5.3* 5.8* 4.9*  ALBUMIN 2.5* 2.8* 2.3*   No results for input(s): LIPASE, AMYLASE in the last 168 hours. No results for input(s): AMMONIA in the last 168 hours. Coagulation Profile: No results for input(s): INR, PROTIME in the last 168 hours. CBC: Recent Labs  Lab 04/10/18 0816 04/10/18 2205 04/11/18 0458 04/12/18 0518 04/13/18 0537  WBC 3.9* 4.7 3.6* 2.1* 1.2*  NEUTROABS 3.0 3.8  --   --  0.9*  HGB 9.1* 9.2* 7.7* 7.3* 7.4*  HCT 28.9* 28.6* 24.5* 22.7* 23.3*  MCV 99.3* 99.0 100.4* 99.6 100.4*  PLT 98* 90* 69* 49* 43*   Cardiac Enzymes: No results for input(s): CKTOTAL, CKMB, CKMBINDEX, TROPONINI in the last 168 hours. BNP: Invalid input(s): POCBNP CBG: No results for input(s): GLUCAP in the last 168 hours. HbA1C: No results for input(s): HGBA1C in the last 72 hours. Urine analysis:    Component Value Date/Time   COLORURINE YELLOW 04/10/2018 2217   APPEARANCEUR HAZY (A) 04/10/2018 2217   LABSPEC 1.010 04/10/2018 2217   PHURINE 6.0 04/10/2018 2217   GLUCOSEU NEGATIVE 04/10/2018 2217   HGBUR NEGATIVE 04/10/2018 2217   BILIRUBINUR NEGATIVE 04/10/2018 2217   KETONESUR NEGATIVE 04/10/2018 2217   PROTEINUR NEGATIVE 04/10/2018 2217   NITRITE POSITIVE (A) 04/10/2018 2217   LEUKOCYTESUR LARGE (A) 04/10/2018 2217   Sepsis Labs: @LABRCNTIP (procalcitonin:4,lacticidven:4) ) Recent Results (from the past 240 hour(s))  Urine Culture     Status: Abnormal   Collection Time: 04/10/18 10:17 PM  Result Value Ref Range Status   Specimen Description   Final    URINE, RANDOM Performed at [facility], [address] [place], [place] [address]    Special Requests   Final    NONE Performed at [facility], [address] [place], [place] [address]    Culture >=100,000 COLONIES/mL ESCHERICHIA COLI (A)  Final   Report Status 04/13/2018 FINAL  Final   Organism ID, Bacteria ESCHERICHIA COLI (A)  Final      Susceptibility   Escherichia coli - MIC*    AMPICILLIN 16 INTERMEDIATE  Intermediate     CEFAZOLIN <=4 SENSITIVE Sensitive     CEFTRIAXONE <=1 SENSITIVE Sensitive     CIPROFLOXACIN >=4 RESISTANT Resistant     GENTAMICIN <=1 SENSITIVE Sensitive     IMIPENEM <=0.25 SENSITIVE Sensitive     NITROFURANTOIN <=16 SENSITIVE Sensitive     TRIMETH/SULFA <=20 SENSITIVE Sensitive     AMPICILLIN/SULBACTAM 4 SENSITIVE Sensitive     PIP/TAZO <=4 SENSITIVE Sensitive     Extended ESBL NEGATIVE Sensitive     * >=100,000 COLONIES/mL ESCHERICHIA COLI  Blood culture (routine x 2)     Status: Abnormal   Collection Time: 04/10/18 11:06 PM  Result Value Ref Range Status   Specimen Description   Final    LEFT ANTECUBITAL Performed at [facility], [address], [place], [place] [address]    Special Requests   Final    BOTTLES DRAWN AEROBIC AND ANAEROBIC Blood Culture adequate volume Performed at [facility], [address], [place], [place] [address]  Culture  Setup Time   Final    GRAM POSITIVE COCCI ANAEROBIC BOTTLE Gram Stain Report Called to,Read Back By and Verified With: ALSTON C. AT [id number] ON [id number] BY THOMPSON S. CRITICAL RESULT CALLED TO, READ BACK BY AND VERIFIED WITH: C ALSTON RN 04/11/18 1817 JDW    Culture (A)  Final    ROTHIA MUCILAGINOSA Standardized susceptibility testing for this organism is not available. STREPTOCOCCUS SPECIES IN BCID DID NOT GROW IN CULTURE Performed at [facility] [address], [place], [place] [address]    Report Status 04/13/2018 FINAL  Final  Blood Culture ID Panel (Reflexed)     Status: Abnormal   Collection Time: 04/10/18 11:06 PM  Result Value Ref Range Status   Enterococcus species NOT DETECTED NOT DETECTED Final   Listeria monocytogenes NOT DETECTED NOT DETECTED Final   Staphylococcus species NOT DETECTED NOT DETECTED Final   Staphylococcus aureus NOT DETECTED NOT DETECTED Final   Streptococcus species DETECTED (A) NOT DETECTED Final    Comment: Not Enterococcus species, Streptococcus agalactiae, Streptococcus  pyogenes, or Streptococcus pneumoniae. CRITICAL RESULT CALLED TO, READ BACK BY AND VERIFIED WITH: C ALSTON RN 04/11/18 1817 JDW    Streptococcus agalactiae NOT DETECTED NOT DETECTED Final   Streptococcus pneumoniae NOT DETECTED NOT DETECTED Final   Streptococcus pyogenes NOT DETECTED NOT DETECTED Final   Acinetobacter baumannii NOT DETECTED NOT DETECTED Final   Enterobacteriaceae species NOT DETECTED NOT DETECTED Final   Enterobacter cloacae complex NOT DETECTED NOT DETECTED Final   Escherichia coli NOT DETECTED NOT DETECTED Final   Klebsiella oxytoca NOT DETECTED NOT DETECTED Final   Klebsiella pneumoniae NOT DETECTED NOT DETECTED Final   Proteus species NOT DETECTED NOT DETECTED Final   Serratia marcescens NOT DETECTED NOT DETECTED Final   Haemophilus influenzae NOT DETECTED NOT DETECTED Final   Neisseria meningitidis NOT DETECTED NOT DETECTED Final   Pseudomonas aeruginosa NOT DETECTED NOT DETECTED Final   Candida albicans NOT DETECTED NOT DETECTED Final   Candida glabrata NOT DETECTED NOT DETECTED Final   Candida krusei NOT DETECTED NOT DETECTED Final   Candida parapsilosis NOT DETECTED NOT DETECTED Final   Candida tropicalis NOT DETECTED NOT DETECTED Final    Comment: Performed at [facility], [place] [address] [place], [place] [address]  Blood culture (routine x 2)     Status: None (Preliminary result)   Collection Time: 04/10/18 11:40 PM  Result Value Ref Range Status   Specimen Description BLOOD LEFT HAND  Final   Special Requests   Final    BOTTLES DRAWN AEROBIC ONLY Blood Culture adequate volume   Culture   Final    NO GROWTH 3 DAYS Performed at [facility], [address] [place], [place] [address]    Report Status PENDING  Incomplete     Scheduled Meds: . docusate sodium  100 mg Oral BID  . feeding supplement (ENSURE ENLIVE)  237 mL Oral BID BM  . filgrastim (NEUPOGEN)  SQ  480 mcg Subcutaneous Daily  . polyethylene glycol  17 g Oral Daily   Continuous  Infusions: . sodium chloride 75 mL/hr at 04/13/18 0812  . ceFEPime (MAXIPIME) IV Stopped (04/13/18 1026)    Procedures/Studies: Dg Chest 2 View  Result Date: 04/10/2018 CLINICAL DATA:  Fever. Chemotherapy today. History of metastatic bladder cancer. EXAM: CHEST - 2 VIEW COMPARISON:  CT chest March 31, 2018 in chest radiograph January 25, 2018 FINDINGS: Heart size is normal. Mediastinal silhouette is not suspicious. Known pulmonary nodules  less conspicuous due to plain radiography. No pleural effusion or focal consolidation. No pneumothorax. Biapical pleural thickening. Single-lumen RIGHT chest Port-A-Cath with distal tip projecting mid superior vena cava. No pneumothorax. Soft tissue planes and included osseous structures are non suspicious. IMPRESSION: 1. No acute cardiopulmonary process. Electronically Signed   By: Elon Alas M.D.   On: 04/10/2018 22:01   Ct Chest W Contrast  Result Date: 03/31/2018 CLINICAL DATA:  Bladder cancer with cystectomy in 2016. Right hip metastasis. EXAM: CT CHEST, ABDOMEN, AND PELVIS WITH CONTRAST TECHNIQUE: Multidetector CT imaging of the chest, abdomen and pelvis was performed following the standard protocol during bolus administration of intravenous contrast. CONTRAST:  158mL OMNIPAQUE IOHEXOL 300 MG/ML  SOLN COMPARISON:  Chest CT 01/03/2018.  Abdominopelvic CT 11/25/2017. FINDINGS: CT CHEST FINDINGS Cardiovascular: A right Port-A-Cath which terminates at the low SVC. Aortic and branch vessel atherosclerosis. Normal heart size, without pericardial effusion. Multivessel coronary artery atherosclerosis. Mediastinum/Nodes: No supraclavicular adenopathy. No mediastinal or hilar adenopathy. Tiny hiatal hernia. Lungs/Pleura: No pleural fluid. Focal right-sided pleural thickening medially is similar. Moderate centrilobular emphysema with lower lobe predominant bronchial wall thickening. Bilateral pulmonary nodules. Index left upper lobe pulmonary nodule measures 12 x 12 mm  on 41/6 and is unchanged. Index anterior right upper lobe pulmonary nodule measures 8 x 5 mm on 63/6 and is slightly decreased from 9 x 8 mm on the prior. The remainder of the smaller pulmonary nodules are similar. Musculoskeletal: No acute osseous abnormality. CT ABDOMEN PELVIS FINDINGS Hepatobiliary: Bilateral hepatic cysts. Normal gallbladder, without biliary ductal dilatation. Pancreas: Pancreatic parenchymal versus vascular calcifications adjacent the common duct within the head. No duct dilatation or acute inflammation. Spleen: Normal in size, without focal abnormality. Adrenals/Urinary Tract: Normal adrenal glands. Favor early contrast excretion in the left kidney, given absence of stones on prior noncontrast exam. Too small to characterize lesions in both kidneys. Good renal collecting system opacification on delayed images. Suboptimal proximal to mid right ureteric opacification on delayed images. No ureteric filling defect identified. Cystectomy and ileal conduit creation, without acute complication. Stomach/Bowel: Normal remainder of the stomach. Otherwise normal large and small bowel. Vascular/Lymphatic: Aortic and branch vessel atherosclerosis. Multiple renal arteries bilaterally. No abdominopelvic adenopathy. Reproductive: Prostatectomy. Other: No significant free fluid. Musculoskeletal: Right inferior pubic ramus lytic lesion with soft tissue component. Measures on the order of 5.2 x 5.1 cm on 92/9. Compare 4.0 by 3.5 cm on the prior exam (when remeasured). IMPRESSION: 1. Redemonstration of bilateral pulmonary metastasis. The majority of nodules are similar in size. One index nodule has minimally decreased. 2. Progression of right inferior pubic ramus osseous metastasis. 3. No new sites of soft tissue metastasis identified. 4. Cystoprostatectomy, as before. 5. Aortic atherosclerosis (ICD10-I70.0), coronary artery atherosclerosis and emphysema (ICD10-J43.9). Electronically Signed   By: Abigail Miyamoto  M.D.   On: 03/31/2018 11:57   Ct Abdomen Pelvis W Contrast  Result Date: 03/31/2018 CLINICAL DATA:  Bladder cancer with cystectomy in 2016. Right hip metastasis. EXAM: CT CHEST, ABDOMEN, AND PELVIS WITH CONTRAST TECHNIQUE: Multidetector CT imaging of the chest, abdomen and pelvis was performed following the standard protocol during bolus administration of intravenous contrast. CONTRAST:  165mL OMNIPAQUE IOHEXOL 300 MG/ML  SOLN COMPARISON:  Chest CT 01/03/2018.  Abdominopelvic CT 11/25/2017. FINDINGS: CT CHEST FINDINGS Cardiovascular: A right Port-A-Cath which terminates at the low SVC. Aortic and branch vessel atherosclerosis. Normal heart size, without pericardial effusion. Multivessel coronary artery atherosclerosis. Mediastinum/Nodes: No supraclavicular adenopathy. No mediastinal or hilar adenopathy. Tiny hiatal hernia. Lungs/Pleura: No  pleural fluid. Focal right-sided pleural thickening medially is similar. Moderate centrilobular emphysema with lower lobe predominant bronchial wall thickening. Bilateral pulmonary nodules. Index left upper lobe pulmonary nodule measures 12 x 12 mm on 41/6 and is unchanged. Index anterior right upper lobe pulmonary nodule measures 8 x 5 mm on 63/6 and is slightly decreased from 9 x 8 mm on the prior. The remainder of the smaller pulmonary nodules are similar. Musculoskeletal: No acute osseous abnormality. CT ABDOMEN PELVIS FINDINGS Hepatobiliary: Bilateral hepatic cysts. Normal gallbladder, without biliary ductal dilatation. Pancreas: Pancreatic parenchymal versus vascular calcifications adjacent the common duct within the head. No duct dilatation or acute inflammation. Spleen: Normal in size, without focal abnormality. Adrenals/Urinary Tract: Normal adrenal glands. Favor early contrast excretion in the left kidney, given absence of stones on prior noncontrast exam. Too small to characterize lesions in both kidneys. Good renal collecting system opacification on delayed images.  Suboptimal proximal to mid right ureteric opacification on delayed images. No ureteric filling defect identified. Cystectomy and ileal conduit creation, without acute complication. Stomach/Bowel: Normal remainder of the stomach. Otherwise normal large and small bowel. Vascular/Lymphatic: Aortic and branch vessel atherosclerosis. Multiple renal arteries bilaterally. No abdominopelvic adenopathy. Reproductive: Prostatectomy. Other: No significant free fluid. Musculoskeletal: Right inferior pubic ramus lytic lesion with soft tissue component. Measures on the order of 5.2 x 5.1 cm on 92/9. Compare 4.0 by 3.5 cm on the prior exam (when remeasured). IMPRESSION: 1. Redemonstration of bilateral pulmonary metastasis. The majority of nodules are similar in size. One index nodule has minimally decreased. 2. Progression of right inferior pubic ramus osseous metastasis. 3. No new sites of soft tissue metastasis identified. 4. Cystoprostatectomy, as before. 5. Aortic atherosclerosis (ICD10-I70.0), coronary artery atherosclerosis and emphysema (ICD10-J43.9). Electronically Signed   By: Abigail Miyamoto M.D.   On: 03/31/2018 11:57    Orson Eva, DO  [facility] Pager [phone]  If 7PM-7AM, please contact night-coverage [email or web address] Password [email or web address] 04/13/2018, 1:45 PM   LOS: 2 days

## 2018-04-13 NOTE — Care Management (Signed)
Met with pt and wife at bedside. Pt from home, ind pta. Pt undergoing chemo for bladder cancer, now with bacteremia and will need to have port removed. Pt will need IV abx at DC, wife willing and able to administer IV abx. CM provided list of IV abx providers and HHA, pt and wife has chosen [facility] from list of options. Vaughan Basta, [facility] rep, given referral. CM will follow and cont to communicate with [place] date and specific IV abx that will be used.

## 2018-04-13 NOTE — Progress Notes (Signed)
CRITICAL VALUE STICKER  CRITICAL VALUE: WBC 1.2  DATE & TIME NOTIFIED: 04/13/18 4765  MD NOTIFIED: Dr. Carles Collet

## 2018-04-14 ENCOUNTER — Encounter (HOSPITAL_COMMUNITY): Payer: Self-pay | Admitting: Interventional Radiology

## 2018-04-14 ENCOUNTER — Ambulatory Visit (HOSPITAL_COMMUNITY)
Admit: 2018-04-14 | Discharge: 2018-04-14 | Disposition: A | Discharge status: Home / Self Care | Payer: Medicare Other | Source: Ambulatory Visit | Attending: Internal Medicine | Admitting: Internal Medicine

## 2018-04-14 ENCOUNTER — Inpatient Hospital Stay (HOSPITAL_COMMUNITY): Payer: Medicare Other

## 2018-04-14 DIAGNOSIS — A419 Sepsis, unspecified organism: Secondary | ICD-10-CM | POA: Insufficient documentation

## 2018-04-14 DIAGNOSIS — T827XXA Infection and inflammatory reaction due to other cardiac and vascular devices, implants and grafts, initial encounter: Secondary | ICD-10-CM | POA: Insufficient documentation

## 2018-04-14 DIAGNOSIS — C679 Malignant neoplasm of bladder, unspecified: Secondary | ICD-10-CM | POA: Insufficient documentation

## 2018-04-14 DIAGNOSIS — Z87891 Personal history of nicotine dependence: Secondary | ICD-10-CM

## 2018-04-14 DIAGNOSIS — R351 Nocturia: Secondary | ICD-10-CM | POA: Insufficient documentation

## 2018-04-14 DIAGNOSIS — R7881 Bacteremia: Secondary | ICD-10-CM

## 2018-04-14 DIAGNOSIS — D703 Neutropenia due to infection: Secondary | ICD-10-CM | POA: Insufficient documentation

## 2018-04-14 DIAGNOSIS — Y712 Prosthetic and other implants, materials and accessory cardiovascular devices associated with adverse incidents: Secondary | ICD-10-CM

## 2018-04-14 DIAGNOSIS — N401 Enlarged prostate with lower urinary tract symptoms: Secondary | ICD-10-CM

## 2018-04-14 DIAGNOSIS — K219 Gastro-esophageal reflux disease without esophagitis: Secondary | ICD-10-CM

## 2018-04-14 DIAGNOSIS — Z452 Encounter for adjustment and management of vascular access device: Secondary | ICD-10-CM | POA: Diagnosis not present

## 2018-04-14 HISTORY — PX: IR REMOVAL TUN ACCESS W/ PORT W/O FL MOD SED: IMG2290

## 2018-04-14 LAB — CBC WITH DIFFERENTIAL/PLATELET
Basophils Absolute: 0 10*3/uL (ref 0.0–0.1)
Basophils Relative: 0 %
Eosinophils Absolute: 0 10*3/uL (ref 0.0–0.7)
Eosinophils Relative: 0 %
HCT: 21 % — ABNORMAL LOW (ref 39.0–52.0)
Hemoglobin: 6.7 g/dL — CL (ref 13.0–17.0)
Lymphocytes Relative: 3 %
Lymphs Abs: 0.2 10*3/uL — ABNORMAL LOW (ref 0.7–4.0)
MCH: 31.9 pg (ref 26.0–34.0)
MCHC: 31.9 g/dL (ref 30.0–36.0)
MCV: 100 fL (ref 78.0–100.0)
Monocytes Absolute: 0.1 10*3/uL (ref 0.1–1.0)
Monocytes Relative: 1 %
Neutro Abs: 7 10*3/uL (ref 1.7–7.7)
Neutrophils Relative %: 96 %
Platelets: 32 10*3/uL — ABNORMAL LOW (ref 150–400)
RBC: 2.1 MIL/uL — ABNORMAL LOW (ref 4.22–5.81)
RDW: 16.4 % — ABNORMAL HIGH (ref 11.5–15.5)
WBC: 7.3 10*3/uL (ref 4.0–10.5)

## 2018-04-14 LAB — ABO/RH: ABO/RH(D): A POS

## 2018-04-14 LAB — PREPARE RBC (CROSSMATCH)

## 2018-04-14 LAB — ECHOCARDIOGRAM COMPLETE
Height: 71 in
Weight: 3104 oz

## 2018-04-14 MED ORDER — FENTANYL CITRATE (PF) 100 MCG/2ML IJ SOLN
INTRAMUSCULAR | Status: AC
  Filled 2018-04-14: qty 4

## 2018-04-14 MED ORDER — LIDOCAINE-EPINEPHRINE (PF) 1 %-1:200000 IJ SOLN
INTRAMUSCULAR | Status: AC
  Filled 2018-04-14: qty 30

## 2018-04-14 MED ORDER — FENTANYL CITRATE (PF) 100 MCG/2ML IJ SOLN
INTRAMUSCULAR | Status: AC | PRN
  Administered 2018-04-14 (×2): 25 ug via INTRAVENOUS

## 2018-04-14 MED ORDER — MIDAZOLAM HCL 2 MG/2ML IJ SOLN
INTRAMUSCULAR | Status: AC
  Filled 2018-04-14: qty 4

## 2018-04-14 MED ORDER — MIDAZOLAM HCL 2 MG/2ML IJ SOLN
INTRAMUSCULAR | Status: AC | PRN
  Administered 2018-04-14: 1 mg via INTRAVENOUS

## 2018-04-14 MED ORDER — SODIUM CHLORIDE 0.9% IV SOLUTION: Freq: Once | INTRAVENOUS | Status: DC

## 2018-04-14 MED ORDER — CEFAZOLIN SODIUM-DEXTROSE 2-4 GM/100ML-% IV SOLN
2.0000 g | Freq: Once | INTRAVENOUS | Status: AC
  Administered 2018-04-14: 2 g via INTRAVENOUS

## 2018-04-14 NOTE — Progress Notes (Signed)
CRITICAL VALUE ALERT  Critical Value:  Hemoglobin 6.7  Date & Time Notied:  04/14/2018 1545  Provider Notified: TAT  Orders Received/Actions taken: no orders given at this time.

## 2018-04-14 NOTE — Progress Notes (Signed)
Pharmacy Antibiotic Note  [name] is a 77 y.o. male admitted on 04/10/2018 with UTI.  Pharmacy has been consulted for cefepime dosing.  Plan: cefepime 2g IV q12h Monitor labs, c/s, and patient improvement  Height: 5\' 11"  (180.3 cm)(Carried from last encounter) Weight: 194 lb (88 kg)(bedscale) IBW/kg (Calculated) : 75.3 Temp (24hrs), Avg:98.7 F (37.1 C), Min:97.6 F (36.4 C), Max:100.1 F (37.8 C)  Recent Labs  Lab 04/10/18 0816 04/10/18 2205 04/10/18 2213 04/10/18 2351 04/11/18 0458 04/12/18 0518 04/13/18 0537  WBC 3.9* 4.7  --   --  3.6* 2.1* 1.2*  CREATININE 0.66 0.80  --   --  0.69 0.62 0.61  LATICACIDVEN  --   --  0.94 0.73  --   --   --     Estimated Creatinine Clearance: 82.4 mL/min (by C-G formula based on SCr of 0.61 mg/dL).    Allergies  Allergen Reactions  . Ciprofloxacin Hives    Antimicrobials this admission: 8/27 cefepime >>   Microbiology results: 8-26 BCx2:  streptococcus 1/2 BCID- did not grow in culture. Culture grew rothia mucilaginosa 8/26 UCx:  >100,000 e. coli  Thank you for allowing pharmacy to participate in the care of this patient.   Margot Ables, PharmD Clinical Pharmacist 04/14/2018 10:56 AM

## 2018-04-14 NOTE — Procedures (Signed)
Bacteremia  S/p PORT removal  No comp No sign of site infection so incision closed and dermabond applied  ebl min Full report in pacs

## 2018-04-14 NOTE — Care Management Important Message (Signed)
Important Message  Patient Details  Name: [name] MRN: [id number] Date of Birth: [birth year]   Medicare Important Message Given:  Yes    [name] 04/14/2018, 11:22 AM

## 2018-04-14 NOTE — Sedation Documentation (Signed)
Patient is resting comfortably. 

## 2018-04-14 NOTE — Care Management (Signed)
Plan for DC home Monday 9/2. [facility] rep aware, HH IVa order in to be processed.

## 2018-04-14 NOTE — Progress Notes (Signed)
*  PRELIMINARY RESULTS* Echocardiogram 2D Echocardiogram has been performed.  Leavy Cella 04/14/2018, 4:04 PM

## 2018-04-14 NOTE — Sedation Documentation (Signed)
Family updated as to patient's status.

## 2018-04-14 NOTE — Progress Notes (Signed)
PROGRESS NOTE  [name]:[id number] DOB: 08-07-1941 DOA: 04/10/2018 PCP: [name], MD  Brief History: 77 year old male with a history of stage IV bladder cancer/urothelial carcinoma with pulmonary and pelvic metastasis, GERD presents with a fever of 1 2.7 F that developed around 7 PM on 04/10/2018. The patient last received chemotherapy on the morning of 04/10/2018. He underwent a cystoscopy prostatectomy on 06/19/2017 with ileal conduit. Currently onCarboplatin and gemcitabine cycle 42managed by Dr. Alen Blew.He did not receive day 8 of the last cycle of chemotherapy because of pancytopenia.Upon presentation, the patient was noted to have WBC 4.7 with low-grade temperature 99.7 F and tachycardia. UA showed 21-50 WBC. Lactic acid was 0.73. The patient was started empirically on cefepime.  Assessment/Plan:  Febrile Neutropenia/SIRS -Last chemotherapy 04/10/2018 -Continue cefepime -Source is Rothia bacteremia -Urinalysis with pyuria, but sample comes from ileal conduit -now afebrile x 48 hours -personally reviewed CXR--no infiltrates -d/c granix   Bacteremia--Rothia -source likely port-a-cath -Continue cefepime -long discussion with pt and spouse regarding risk of recurrent bacteremia with future chemotherapy if port-a-cath is retained -port-a-cath removed 8/30 -case discussed with Dr. Alen Blew -continue abx for 14 days beyond last neg culture  [facility] Bacteruria -significance is unclear presently as sample is obtained from ileal conduit  Metastatic urothelial carcinoma -Managed by Dr. Alen Blew -Currently onCarboplatin and gemcitabine cycle 4  Pancytopenia -related to recent chemo -transfuse 2 units PRBC -am CBC with diff -SCDs  Cancer associated pain -Continue home dose of hydromorphone  Fixed drug reaction -?related to chemo agents -sarna prn -benadryl prn itch    Disposition Plan: Home in 2-3days  Family  Communication:Spouse updatedat bedside8/30/19 --Total time spent 35 minutes.  Greater than 50% spent face to face counseling and coordinating care.   Consultants:IR  Code Status: FULL  DVT Prophylaxis: SCDs   Procedures: As Listed in Progress Note Above  Antibiotics: Cefepime 8/27>>>    Subjective: Patient denies fevers, chills, headache, chest pain, dyspnea, nausea, vomiting, diarrhea, abdominal pain, dysuria, hematuria, hematochezia, and melena.   Objective: Vitals:   04/14/18 0000 04/14/18 0646 04/14/18 0659 04/14/18 1501  BP:  (!) 88/60 110/68 112/68  Pulse:  87  91  Resp:  18  19  Temp: 98.5 F (36.9 C) 97.6 F (36.4 C)  98.2 F (36.8 C)  TempSrc:  Oral  Oral  SpO2:  98%  100%  Weight:      Height:       No intake or output data in the 24 hours ending 04/14/18 1731 Weight change:  Exam:   General:  Pt is alert, follows commands appropriately, not in acute distress  HEENT: No icterus, No thrush, No neck mass, [place]/AT  Cardiovascular: RRR, S1/S2, no rubs, no gallops  Respiratory: CTA bilaterally, no wheezing, no crackles, no rhonchi  Abdomen: Soft/+BS, non tender, non distended, no guarding  Extremities: No edema, No lymphangitis, No petechiae, No rashes, no synovitis   Data Reviewed: I have personally reviewed following labs and imaging studies Basic Metabolic Panel: Recent Labs  Lab 04/10/18 0816 04/10/18 2205 04/11/18 0458 04/12/18 0518 04/13/18 0537  NA 136 134* 136 138 140  K 3.6 3.6 3.5 3.7 3.8  CL 100 100 104 104 106  CO2 29 25 25 27 26   GLUCOSE 102* 118* 131* 119* 94  BUN 13 17 14 11 11   CREATININE 0.66 0.80 0.69 0.62 0.61  CALCIUM 9.1 8.7* 8.1* 8.1* 8.3*   Liver Function Tests: Recent Labs  Lab  04/10/18 0816 04/10/18 2205 04/11/18 0458  AST 18 26 26   ALT 33 34 29  ALKPHOS 61 50 44  BILITOT 0.5 1.0 0.5  PROT 5.3* 5.8* 4.9*  ALBUMIN 2.5* 2.8* 2.3*   No results for input(s): LIPASE, AMYLASE in the last 168  hours. No results for input(s): AMMONIA in the last 168 hours. Coagulation Profile: Recent Labs  Lab 04/13/18 1545  INR 0.96   CBC: Recent Labs  Lab 04/10/18 0816 04/10/18 2205 04/11/18 0458 04/12/18 0518 04/13/18 0537 04/14/18 1527  WBC 3.9* 4.7 3.6* 2.1* 1.2* 7.3  NEUTROABS 3.0 3.8  --   --  0.9* 7.0  HGB 9.1* 9.2* 7.7* 7.3* 7.4* 6.7*  HCT 28.9* 28.6* 24.5* 22.7* 23.3* 21.0*  MCV 99.3* 99.0 100.4* 99.6 100.4* 100.0  PLT 98* 90* 69* 49* 43* 32*   Cardiac Enzymes: No results for input(s): CKTOTAL, CKMB, CKMBINDEX, TROPONINI in the last 168 hours. BNP: Invalid input(s): POCBNP CBG: No results for input(s): GLUCAP in the last 168 hours. HbA1C: No results for input(s): HGBA1C in the last 72 hours. Urine analysis:    Component Value Date/Time   COLORURINE YELLOW 04/10/2018 2217   APPEARANCEUR HAZY (A) 04/10/2018 2217   LABSPEC 1.010 04/10/2018 2217   PHURINE 6.0 04/10/2018 2217   GLUCOSEU NEGATIVE 04/10/2018 2217   HGBUR NEGATIVE 04/10/2018 2217   BILIRUBINUR NEGATIVE 04/10/2018 2217   KETONESUR NEGATIVE 04/10/2018 2217   PROTEINUR NEGATIVE 04/10/2018 2217   NITRITE POSITIVE (A) 04/10/2018 2217   LEUKOCYTESUR LARGE (A) 04/10/2018 2217   Sepsis Labs: @LABRCNTIP (procalcitonin:4,lacticidven:4) ) Recent Results (from the past 240 hour(s))  Urine Culture     Status: Abnormal   Collection Time: 04/10/18 10:17 PM  Result Value Ref Range Status   Specimen Description   Final    URINE, RANDOM Performed at [facility], [address] [place], [place] [address]    Special Requests   Final    NONE Performed at [facility], [address] [place], [place] [address]    Culture >=100,000 COLONIES/mL ESCHERICHIA COLI (A)  Final   Report Status 04/13/2018 FINAL  Final   Organism ID, Bacteria ESCHERICHIA COLI (A)  Final      Susceptibility   Escherichia coli - MIC*    AMPICILLIN 16 INTERMEDIATE Intermediate     CEFAZOLIN <=4 SENSITIVE Sensitive     CEFTRIAXONE <=1  SENSITIVE Sensitive     CIPROFLOXACIN >=4 RESISTANT Resistant     GENTAMICIN <=1 SENSITIVE Sensitive     IMIPENEM <=0.25 SENSITIVE Sensitive     NITROFURANTOIN <=16 SENSITIVE Sensitive     TRIMETH/SULFA <=20 SENSITIVE Sensitive     AMPICILLIN/SULBACTAM 4 SENSITIVE Sensitive     PIP/TAZO <=4 SENSITIVE Sensitive     Extended ESBL NEGATIVE Sensitive     * >=100,000 COLONIES/mL ESCHERICHIA COLI  Blood culture (routine x 2)     Status: Abnormal   Collection Time: 04/10/18 11:06 PM  Result Value Ref Range Status   Specimen Description   Final    LEFT ANTECUBITAL Performed at [facility], [address] [place], [place] [address]    Special Requests   Final    BOTTLES DRAWN AEROBIC AND ANAEROBIC Blood Culture adequate volume Performed at [facility], [address], [place], [place] [address]    Culture  Setup Time   Final    GRAM POSITIVE COCCI ANAEROBIC BOTTLE Gram Stain Report Called to,Read Back By and Verified With: ALSTON C. AT 1505 ON [id number] BY THOMPSON S. CRITICAL RESULT CALLED  TO, READ BACK BY AND VERIFIED WITH: C ALSTON RN 04/11/18 1817 JDW    Culture (A)  Final    ROTHIA MUCILAGINOSA Standardized susceptibility testing for this organism is not available. STREPTOCOCCUS SPECIES IN BCID DID NOT GROW IN CULTURE Performed at [facility] [address] [place], [place] [address]    Report Status 04/13/2018 FINAL  Final  Blood Culture ID Panel (Reflexed)     Status: Abnormal   Collection Time: 04/10/18 11:06 PM  Result Value Ref Range Status   Enterococcus species NOT DETECTED NOT DETECTED Final   Listeria monocytogenes NOT DETECTED NOT DETECTED Final   Staphylococcus species NOT DETECTED NOT DETECTED Final   Staphylococcus aureus NOT DETECTED NOT DETECTED Final   Streptococcus species DETECTED (A) NOT DETECTED Final    Comment: Not Enterococcus species, Streptococcus agalactiae, Streptococcus pyogenes, or Streptococcus pneumoniae. CRITICAL RESULT CALLED TO, READ BACK  BY AND VERIFIED WITH: C ALSTON RN 04/11/18 1817 JDW    Streptococcus agalactiae NOT DETECTED NOT DETECTED Final   Streptococcus pneumoniae NOT DETECTED NOT DETECTED Final   Streptococcus pyogenes NOT DETECTED NOT DETECTED Final   Acinetobacter baumannii NOT DETECTED NOT DETECTED Final   Enterobacteriaceae species NOT DETECTED NOT DETECTED Final   Enterobacter cloacae complex NOT DETECTED NOT DETECTED Final   Escherichia coli NOT DETECTED NOT DETECTED Final   Klebsiella oxytoca NOT DETECTED NOT DETECTED Final   Klebsiella pneumoniae NOT DETECTED NOT DETECTED Final   Proteus species NOT DETECTED NOT DETECTED Final   Serratia marcescens NOT DETECTED NOT DETECTED Final   Haemophilus influenzae NOT DETECTED NOT DETECTED Final   Neisseria meningitidis NOT DETECTED NOT DETECTED Final   Pseudomonas aeruginosa NOT DETECTED NOT DETECTED Final   Candida albicans NOT DETECTED NOT DETECTED Final   Candida glabrata NOT DETECTED NOT DETECTED Final   Candida krusei NOT DETECTED NOT DETECTED Final   Candida parapsilosis NOT DETECTED NOT DETECTED Final   Candida tropicalis NOT DETECTED NOT DETECTED Final    Comment: Performed at [facility], [place] [address], [place], [place] [address]  Blood culture (routine x 2)     Status: None (Preliminary result)   Collection Time: 04/10/18 11:40 PM  Result Value Ref Range Status   Specimen Description BLOOD LEFT HAND  Final   Special Requests   Final    BOTTLES DRAWN AEROBIC ONLY Blood Culture adequate volume   Culture   Final    NO GROWTH 4 DAYS Performed at [facility], [address] [place], [place] [address]    Report Status PENDING  Incomplete  Culture, blood (Routine X 2) w Reflex to ID Panel     Status: None (Preliminary result)   Collection Time: 04/13/18  1:58 PM  Result Value Ref Range Status   Specimen Description LEFT ANTECUBITAL  Final   Special Requests   Final    BOTTLES DRAWN AEROBIC AND ANAEROBIC Blood Culture adequate volume    Culture   Final    NO GROWTH < 24 HOURS Performed at [facility], [address], [place], [place] [address]    Report Status PENDING  Incomplete  Culture, blood (Routine X 2) w Reflex to ID Panel     Status: None (Preliminary result)   Collection Time: 04/13/18  2:03 PM  Result Value Ref Range Status   Specimen Description BLOOD CENTRAL LINE  Final   Special Requests   Final    BOTTLES DRAWN AEROBIC AND ANAEROBIC Blood Culture adequate volume   Culture  Final    NO GROWTH < 24 HOURS Performed at [facility], [address] [place],  [address]    Report Status PENDING  Incomplete     Scheduled Meds: . docusate sodium  100 mg Oral BID  . feeding supplement (ENSURE ENLIVE)  237 mL Oral BID BM  . filgrastim (NEUPOGEN)  SQ  480 mcg Subcutaneous Daily  . polyethylene glycol  17 g Oral Daily   Continuous Infusions: . sodium chloride 75 mL/hr at 04/14/18 1214  . ceFEPime (MAXIPIME) IV Stopped (04/13/18 2138)    Procedures/Studies: Dg Chest 2 View  Result Date: 04/10/2018 CLINICAL DATA:  Fever. Chemotherapy today. History of metastatic bladder cancer. EXAM: CHEST - 2 VIEW COMPARISON:  CT chest March 31, 2018 in chest radiograph January 25, 2018 FINDINGS: Heart size is normal. Mediastinal silhouette is not suspicious. Known pulmonary nodules less conspicuous due to plain radiography. No pleural effusion or focal consolidation. No pneumothorax. Biapical pleural thickening. Single-lumen RIGHT chest Port-A-Cath with distal tip projecting mid superior vena cava. No pneumothorax. Soft tissue planes and included osseous structures are non suspicious. IMPRESSION: 1. No acute cardiopulmonary process. Electronically Signed   By: Elon Alas M.D.   On: 04/10/2018 22:01   Ct Chest W Contrast  Result Date: 03/31/2018 CLINICAL DATA:  Bladder cancer with cystectomy in 2016. Right hip metastasis. EXAM: CT CHEST, ABDOMEN, AND PELVIS WITH CONTRAST TECHNIQUE: Multidetector CT imaging of the  chest, abdomen and pelvis was performed following the standard protocol during bolus administration of intravenous contrast. CONTRAST:  129mL OMNIPAQUE IOHEXOL 300 MG/ML  SOLN COMPARISON:  Chest CT 01/03/2018.  Abdominopelvic CT 11/25/2017. FINDINGS: CT CHEST FINDINGS Cardiovascular: A right Port-A-Cath which terminates at the low SVC. Aortic and branch vessel atherosclerosis. Normal heart size, without pericardial effusion. Multivessel coronary artery atherosclerosis. Mediastinum/Nodes: No supraclavicular adenopathy. No mediastinal or hilar adenopathy. Tiny hiatal hernia. Lungs/Pleura: No pleural fluid. Focal right-sided pleural thickening medially is similar. Moderate centrilobular emphysema with lower lobe predominant bronchial wall thickening. Bilateral pulmonary nodules. Index left upper lobe pulmonary nodule measures 12 x 12 mm on 41/6 and is unchanged. Index anterior right upper lobe pulmonary nodule measures 8 x 5 mm on 63/6 and is slightly decreased from 9 x 8 mm on the prior. The remainder of the smaller pulmonary nodules are similar. Musculoskeletal: No acute osseous abnormality. CT ABDOMEN PELVIS FINDINGS Hepatobiliary: Bilateral hepatic cysts. Normal gallbladder, without biliary ductal dilatation. Pancreas: Pancreatic parenchymal versus vascular calcifications adjacent the common duct within the head. No duct dilatation or acute inflammation. Spleen: Normal in size, without focal abnormality. Adrenals/Urinary Tract: Normal adrenal glands. Favor early contrast excretion in the left kidney, given absence of stones on prior noncontrast exam. Too small to characterize lesions in both kidneys. Good renal collecting system opacification on delayed images. Suboptimal proximal to mid right ureteric opacification on delayed images. No ureteric filling defect identified. Cystectomy and ileal conduit creation, without acute complication. Stomach/Bowel: Normal remainder of the stomach. Otherwise normal large and  small bowel. Vascular/Lymphatic: Aortic and branch vessel atherosclerosis. Multiple renal arteries bilaterally. No abdominopelvic adenopathy. Reproductive: Prostatectomy. Other: No significant free fluid. Musculoskeletal: Right inferior pubic ramus lytic lesion with soft tissue component. Measures on the order of 5.2 x 5.1 cm on 92/9. Compare 4.0 by 3.5 cm on the prior exam (when remeasured). IMPRESSION: 1. Redemonstration of bilateral pulmonary metastasis. The majority of nodules are similar in size. One index nodule has minimally decreased. 2. Progression of right inferior pubic ramus osseous metastasis. 3. No new  sites of soft tissue metastasis identified. 4. Cystoprostatectomy, as before. 5. Aortic atherosclerosis (ICD10-I70.0), coronary artery atherosclerosis and emphysema (ICD10-J43.9). Electronically Signed   By: Abigail Miyamoto M.D.   On: 03/31/2018 11:57   Ct Abdomen Pelvis W Contrast  Result Date: 03/31/2018 CLINICAL DATA:  Bladder cancer with cystectomy in 2016. Right hip metastasis. EXAM: CT CHEST, ABDOMEN, AND PELVIS WITH CONTRAST TECHNIQUE: Multidetector CT imaging of the chest, abdomen and pelvis was performed following the standard protocol during bolus administration of intravenous contrast. CONTRAST:  163mL OMNIPAQUE IOHEXOL 300 MG/ML  SOLN COMPARISON:  Chest CT 01/03/2018.  Abdominopelvic CT 11/25/2017. FINDINGS: CT CHEST FINDINGS Cardiovascular: A right Port-A-Cath which terminates at the low SVC. Aortic and branch vessel atherosclerosis. Normal heart size, without pericardial effusion. Multivessel coronary artery atherosclerosis. Mediastinum/Nodes: No supraclavicular adenopathy. No mediastinal or hilar adenopathy. Tiny hiatal hernia. Lungs/Pleura: No pleural fluid. Focal right-sided pleural thickening medially is similar. Moderate centrilobular emphysema with lower lobe predominant bronchial wall thickening. Bilateral pulmonary nodules. Index left upper lobe pulmonary nodule measures 12 x 12 mm  on 41/6 and is unchanged. Index anterior right upper lobe pulmonary nodule measures 8 x 5 mm on 63/6 and is slightly decreased from 9 x 8 mm on the prior. The remainder of the smaller pulmonary nodules are similar. Musculoskeletal: No acute osseous abnormality. CT ABDOMEN PELVIS FINDINGS Hepatobiliary: Bilateral hepatic cysts. Normal gallbladder, without biliary ductal dilatation. Pancreas: Pancreatic parenchymal versus vascular calcifications adjacent the common duct within the head. No duct dilatation or acute inflammation. Spleen: Normal in size, without focal abnormality. Adrenals/Urinary Tract: Normal adrenal glands. Favor early contrast excretion in the left kidney, given absence of stones on prior noncontrast exam. Too small to characterize lesions in both kidneys. Good renal collecting system opacification on delayed images. Suboptimal proximal to mid right ureteric opacification on delayed images. No ureteric filling defect identified. Cystectomy and ileal conduit creation, without acute complication. Stomach/Bowel: Normal remainder of the stomach. Otherwise normal large and small bowel. Vascular/Lymphatic: Aortic and branch vessel atherosclerosis. Multiple renal arteries bilaterally. No abdominopelvic adenopathy. Reproductive: Prostatectomy. Other: No significant free fluid. Musculoskeletal: Right inferior pubic ramus lytic lesion with soft tissue component. Measures on the order of 5.2 x 5.1 cm on 92/9. Compare 4.0 by 3.5 cm on the prior exam (when remeasured). IMPRESSION: 1. Redemonstration of bilateral pulmonary metastasis. The majority of nodules are similar in size. One index nodule has minimally decreased. 2. Progression of right inferior pubic ramus osseous metastasis. 3. No new sites of soft tissue metastasis identified. 4. Cystoprostatectomy, as before. 5. Aortic atherosclerosis (ICD10-I70.0), coronary artery atherosclerosis and emphysema (ICD10-J43.9). Electronically Signed   By: Abigail Miyamoto  M.D.   On: 03/31/2018 11:57   Ir Removal [employer] W/ [facility] W/o Fl Mod Sed  Result Date: 04/14/2018 CLINICAL DATA:  Bacteremia, concern for infection EXAM: REMOVAL OF IMPLANTED TUNNELED PORT-A-CATH MEDICATIONS: Ancef 2 g. The antibiotic was administered within 1 hour prior to the start of the procedure. ANESTHESIA/SEDATION: Moderate (conscious) sedation was employed during this procedure. A total of Versed 1.0 mg and Fentanyl 50 mcg was administered intravenously. Moderate Sedation Time: 10 minutes. The patient's level of consciousness and vital signs were monitored continuously by radiology nursing throughout the procedure under my direct supervision. FLUOROSCOPY TIME:  None PROCEDURE: Informed written consent was obtained from the patient after a discussion of the risk, benefits and alternatives to the procedure. The patient was positioned supine on the fluoroscopy table and the right chest Port-A-Cath site was prepped with chlorhexidine. A  sterile gown and gloves were worn during the procedure. Local anesthesia was provided with 1% lidocaine with epinephrine. A timeout was performed prior to the initiation of the procedure. An incision was made overlying the Port-A-Cath with a #15 scalpel. Utilizing sharp and blunt dissection, the Port-A-Cath was removed completely. The pocked was irrigated with sterile saline. Wound closure was performed with subcutaneous 2-0 Vicryl, subcuticular 4-0 Vicryl, Dermabond and Steri-Strips. A dressing was placed. The patient tolerated the procedure well without immediate post procedural complication. FINDINGS: Successful removal of implant Port-A-Cath without immediate post procedural complication. IMPRESSION: Successful removal of implanted Port-A-Cath. Electronically Signed   By: Jerilynn Mages.  Shick M.D.   On: 04/14/2018 11:48    Orson Eva, DO  [facility] Pager [phone]  If 7PM-7AM, please contact night-coverage [email or web address] Password [email or web address] 04/14/2018, 5:31 PM    LOS: 3 days

## 2018-04-14 NOTE — H&P (Signed)
Chief Complaint: Patient was seen in consultation today for Port-A-Cath removal  Referring Physician(s): Tat,David  Supervising Physician: Daryll Brod  Patient Status: [facility] - In-pt  History of Present Illness: [name] is a 77 y.o. male with past medical history of stage IV bladder cancer/urothelial carcinoma who has been receiving chemotherapy via Port-A-Cath originally placed 02/07/18 by Dr. Vernard Gambles.  Patient with pancytopenia which prevented his most recent chemo infusion.  He is now admitted to [facility] with neutropenic fever and suspected sepsis due to Port-A-Cath infection.  IR consulted for Port-A-Cath removal at the request of Dr. Carles Collet.   Past Medical History:  Diagnosis Date  . Bladder cancer ([place])   . BPH (benign prostatic hypertrophy)   . Dysuria   . GERD (gastroesophageal reflux disease)   . History of adenomatous polyp of colon    tubular adenoma 06/ 2018  . History of bladder cancer 12/2014   urologist-  dr [name]--  dx High Grade TCC without stromal invasion s/p TURBT and intravesical BCG tx/  recurrent 07/2015 (pTa) TCC  repear intravesical BCG tx   . History of hiatal hernia   . History of urethral stricture   . Nocturia     Past Surgical History:  Procedure Laterality Date  . CARDIAC CATHETERIZATION  1997   normal coronary arteries  . CARDIOVASCULAR STRESS TEST  12-14-2005   Low risk perfusion study/  very small scar in the inferior septum from mid ventricle to apex,  no ischemia/  mild inferior septal hypokinesis/  ef 58%  . CATARACT EXTRACTION W/ INTRAOCULAR LENS  IMPLANT, BILATERAL  2011  . COLONOSCOPY  last one 06/ 2018  . CYSTOSCOPY WITH BIOPSY N/A 08/12/2015   Procedure: CYSTOSCOPY WITH BIOPSY AND FULGERATION;  Surgeon: [name], MD;  Location: [facility];  Service: Urology;  Laterality: N/A;  . CYSTOSCOPY WITH INJECTION N/A 06/29/2017   Procedure: CYSTOSCOPY WITH INJECTION OF INDOCYANINE GREEN DYE;  Surgeon: Alexis Frock, MD;  Location: [facility];  Service: Urology;  Laterality: N/A;  . CYSTOSCOPY WITH URETHRAL DILATATION N/A 03/21/2017   Procedure: CYSTOSCOPY;  Surgeon: [name], MD;  Location: [facility];  Service: Urology;  Laterality: N/A;  . ESOPHAGOGASTRODUODENOSCOPY  12-05-2014  . IR IMAGING GUIDED PORT INSERTION  02/07/2018  . TRANSTHORACIC ECHOCARDIOGRAM  01-31-2008   nomral LVF,  ef 55-65%/  mild pulmonic stenosis without regurg.,  peak transpulomonic valve gradient 80mmHg  . TRANSURETHRAL RESECTION OF BLADDER TUMOR N/A 12/31/2014   Procedure: TRANSURETHRAL RESECTION OF BLADDER TUMOR (TURBT);  Surgeon: Lowella Bandy, MD;  Location: [facility];  Service: Urology;  Laterality: N/A;  . TRANSURETHRAL RESECTION OF BLADDER TUMOR N/A 03/21/2017   Procedure: POSSIBLE TRANSURETHRAL RESECTION OF BLADDER TUMOR (TURBT);  Surgeon: [name], MD;  Location: [facility];  Service: Urology;  Laterality: N/A;    Allergies: Ciprofloxacin  Medications: Prior to Admission medications   Medication Sig Start Date End Date Taking? Authorizing Provider  acetaminophen (TYLENOL) 500 MG tablet Take 1,000 mg by mouth every 6 (six) hours as needed.    [provider]  HYDROmorphone (DILAUDID) 4 MG tablet Take 1 tablet (4 mg total) by mouth every 4 (four) hours as needed for severe pain. 04/03/18   [name], MD  hydrOXYzine (ATARAX/VISTARIL) 50 MG tablet Take 1 tablet (50 mg total) by mouth 4 (four) times daily as needed. 04/03/18   [name], MD  lidocaine-prilocaine (EMLA) cream Apply 1 application  topically as needed. 01/18/18   [name], MD  oxyCODONE (OXY IR/ROXICODONE) 5 MG immediate release tablet Take 1-2 tablets (5-10 mg total) by mouth every 4 (four) hours as needed for severe pain. 04/03/18   [name], MD  predniSONE (STERAPRED UNI-PAK 21 TAB) 10 MG (21) TBPK tablet Take 6 tablets for a day.  Take 5 tablets for a day,  then Take 4 tablets for a day Take 3 tablets for a day Take 2 tablets for a day Take one tablet for day and stop. Patient not taking: Reported on 04/10/2018 03/02/18   [name], MD  prochlorperazine (COMPAZINE) 10 MG tablet Take 1 tablet (10 mg total) by mouth every 6 (six) hours as needed for nausea or vomiting. 01/18/18   [name], MD     Family History  Problem Relation Age of Onset  . Colon cancer Neg Hx   . Colon polyps Neg Hx   . Diabetes Neg Hx   . Kidney disease Neg Hx   . Esophageal cancer Neg Hx   . Heart disease Neg Hx   . Gallbladder disease Neg Hx     Social History   Socioeconomic History  . Marital status: Married    Spouse name: Not on file  . Number of children: 1  . Years of education: Not on file  . Highest education level: Not on file  Occupational History  . Occupation: Retired  Scientific laboratory technician  . Financial resource strain: Not on file  . Food insecurity:    Worry: Not on file    Inability: Not on file  . Transportation needs:    Medical: Not on file    Non-medical: Not on file  Tobacco Use  . Smoking status: Former Smoker    Packs/day: 1.00    Years: 34.00    Pack years: 34.00    Types: Cigarettes    Last attempt to quit: 12/26/1988    Years since quitting: 29.3  . Smokeless tobacco: Never Used  Substance and Sexual Activity  . Alcohol use: Yes    Alcohol/week: 6.0 standard drinks    Types: 6 Cans of beer per week    Comment: BEER  . Drug use: No  . Sexual activity: Never  Lifestyle  . Physical activity:    Days per week: Not on file    Minutes per session: Not on file  . Stress: Not on file  Relationships  . Social connections:    Talks on phone: Not on file    Gets together: Not on file    Attends religious service: Not on file    Active member of club or organization: Not on file    Attends meetings of clubs or organizations: Not on file    Relationship status: Not on file  Other Topics Concern  . Not on file  Social  History Narrative  . Not on file     Review of Systems: A 12 point ROS discussed and pertinent positives are indicated in the HPI above.  All other systems are negative.  Review of Systems  Constitutional: Positive for fatigue and fever.  Respiratory: Negative for cough and shortness of breath.   Cardiovascular: Negative for chest pain.  Gastrointestinal: Negative for abdominal pain, nausea and vomiting.  Genitourinary: Negative for dysuria (has ileal conduit in place).  Musculoskeletal: Negative for back pain.  Psychiatric/Behavioral: Negative for behavioral problems and confusion.    Vital Signs: BP 91/67 (BP Location: Right Arm)  Pulse 80   Resp 20   SpO2 98%   Physical Exam  Constitutional: He is oriented to person, place, and time. He appears well-developed. No distress.  Neck: Normal range of motion. Neck supple. No tracheal deviation present.  Port-A-Cath in place on right chest.  No expressible pus.   No erythema or warmth.  Cardiovascular: Normal rate, regular rhythm and normal heart sounds. Exam reveals no gallop and no friction rub.  No murmur heard. Pulmonary/Chest: Effort normal and breath sounds normal. No respiratory distress.  Abdominal: Soft. He exhibits no distension. There is no tenderness.  Lymphadenopathy:    He has no cervical adenopathy.  Neurological: He is alert and oriented to person, place, and time.  Skin: Skin is warm and dry. He is not diaphoretic.  Psychiatric: He has a normal mood and affect. His behavior is normal. Judgment and thought content normal.  Nursing note and vitals reviewed.    MD Evaluation Airway: WNL Heart: WNL Abdomen: WNL Chest/ Lungs: WNL ASA  Classification: 3 Mallampati/Airway Score: Two   Imaging: Dg Chest 2 View  Result Date: 04/10/2018 CLINICAL DATA:  Fever. Chemotherapy today. History of metastatic bladder cancer. EXAM: CHEST - 2 VIEW COMPARISON:  CT chest March 31, 2018 in chest radiograph January 25, 2018  FINDINGS: Heart size is normal. Mediastinal silhouette is not suspicious. Known pulmonary nodules less conspicuous due to plain radiography. No pleural effusion or focal consolidation. No pneumothorax. Biapical pleural thickening. Single-lumen RIGHT chest Port-A-Cath with distal tip projecting mid superior vena cava. No pneumothorax. Soft tissue planes and included osseous structures are non suspicious. IMPRESSION: 1. No acute cardiopulmonary process. Electronically Signed   By: Elon Alas M.D.   On: 04/10/2018 22:01   Ct Chest W Contrast  Result Date: 03/31/2018 CLINICAL DATA:  Bladder cancer with cystectomy in 2016. Right hip metastasis. EXAM: CT CHEST, ABDOMEN, AND PELVIS WITH CONTRAST TECHNIQUE: Multidetector CT imaging of the chest, abdomen and pelvis was performed following the standard protocol during bolus administration of intravenous contrast. CONTRAST:  159mL OMNIPAQUE IOHEXOL 300 MG/ML  SOLN COMPARISON:  Chest CT 01/03/2018.  Abdominopelvic CT 11/25/2017. FINDINGS: CT CHEST FINDINGS Cardiovascular: A right Port-A-Cath which terminates at the low SVC. Aortic and branch vessel atherosclerosis. Normal heart size, without pericardial effusion. Multivessel coronary artery atherosclerosis. Mediastinum/Nodes: No supraclavicular adenopathy. No mediastinal or hilar adenopathy. Tiny hiatal hernia. Lungs/Pleura: No pleural fluid. Focal right-sided pleural thickening medially is similar. Moderate centrilobular emphysema with lower lobe predominant bronchial wall thickening. Bilateral pulmonary nodules. Index left upper lobe pulmonary nodule measures 12 x 12 mm on 41/6 and is unchanged. Index anterior right upper lobe pulmonary nodule measures 8 x 5 mm on 63/6 and is slightly decreased from 9 x 8 mm on the prior. The remainder of the smaller pulmonary nodules are similar. Musculoskeletal: No acute osseous abnormality. CT ABDOMEN PELVIS FINDINGS Hepatobiliary: Bilateral hepatic cysts. Normal gallbladder,  without biliary ductal dilatation. Pancreas: Pancreatic parenchymal versus vascular calcifications adjacent the common duct within the head. No duct dilatation or acute inflammation. Spleen: Normal in size, without focal abnormality. Adrenals/Urinary Tract: Normal adrenal glands. Favor early contrast excretion in the left kidney, given absence of stones on prior noncontrast exam. Too small to characterize lesions in both kidneys. Good renal collecting system opacification on delayed images. Suboptimal proximal to mid right ureteric opacification on delayed images. No ureteric filling defect identified. Cystectomy and ileal conduit creation, without acute complication. Stomach/Bowel: Normal remainder of the stomach. Otherwise normal large and small bowel. Vascular/Lymphatic: Aortic  and branch vessel atherosclerosis. Multiple renal arteries bilaterally. No abdominopelvic adenopathy. Reproductive: Prostatectomy. Other: No significant free fluid. Musculoskeletal: Right inferior pubic ramus lytic lesion with soft tissue component. Measures on the order of 5.2 x 5.1 cm on 92/9. Compare 4.0 by 3.5 cm on the prior exam (when remeasured). IMPRESSION: 1. Redemonstration of bilateral pulmonary metastasis. The majority of nodules are similar in size. One index nodule has minimally decreased. 2. Progression of right inferior pubic ramus osseous metastasis. 3. No new sites of soft tissue metastasis identified. 4. Cystoprostatectomy, as before. 5. Aortic atherosclerosis (ICD10-I70.0), coronary artery atherosclerosis and emphysema (ICD10-J43.9). Electronically Signed   By: Abigail Miyamoto M.D.   On: 03/31/2018 11:57   Ct Abdomen Pelvis W Contrast  Result Date: 03/31/2018 CLINICAL DATA:  Bladder cancer with cystectomy in 2016. Right hip metastasis. EXAM: CT CHEST, ABDOMEN, AND PELVIS WITH CONTRAST TECHNIQUE: Multidetector CT imaging of the chest, abdomen and pelvis was performed following the standard protocol during bolus  administration of intravenous contrast. CONTRAST:  122mL OMNIPAQUE IOHEXOL 300 MG/ML  SOLN COMPARISON:  Chest CT 01/03/2018.  Abdominopelvic CT 11/25/2017. FINDINGS: CT CHEST FINDINGS Cardiovascular: A right Port-A-Cath which terminates at the low SVC. Aortic and branch vessel atherosclerosis. Normal heart size, without pericardial effusion. Multivessel coronary artery atherosclerosis. Mediastinum/Nodes: No supraclavicular adenopathy. No mediastinal or hilar adenopathy. Tiny hiatal hernia. Lungs/Pleura: No pleural fluid. Focal right-sided pleural thickening medially is similar. Moderate centrilobular emphysema with lower lobe predominant bronchial wall thickening. Bilateral pulmonary nodules. Index left upper lobe pulmonary nodule measures 12 x 12 mm on 41/6 and is unchanged. Index anterior right upper lobe pulmonary nodule measures 8 x 5 mm on 63/6 and is slightly decreased from 9 x 8 mm on the prior. The remainder of the smaller pulmonary nodules are similar. Musculoskeletal: No acute osseous abnormality. CT ABDOMEN PELVIS FINDINGS Hepatobiliary: Bilateral hepatic cysts. Normal gallbladder, without biliary ductal dilatation. Pancreas: Pancreatic parenchymal versus vascular calcifications adjacent the common duct within the head. No duct dilatation or acute inflammation. Spleen: Normal in size, without focal abnormality. Adrenals/Urinary Tract: Normal adrenal glands. Favor early contrast excretion in the left kidney, given absence of stones on prior noncontrast exam. Too small to characterize lesions in both kidneys. Good renal collecting system opacification on delayed images. Suboptimal proximal to mid right ureteric opacification on delayed images. No ureteric filling defect identified. Cystectomy and ileal conduit creation, without acute complication. Stomach/Bowel: Normal remainder of the stomach. Otherwise normal large and small bowel. Vascular/Lymphatic: Aortic and branch vessel atherosclerosis. Multiple  renal arteries bilaterally. No abdominopelvic adenopathy. Reproductive: Prostatectomy. Other: No significant free fluid. Musculoskeletal: Right inferior pubic ramus lytic lesion with soft tissue component. Measures on the order of 5.2 x 5.1 cm on 92/9. Compare 4.0 by 3.5 cm on the prior exam (when remeasured). IMPRESSION: 1. Redemonstration of bilateral pulmonary metastasis. The majority of nodules are similar in size. One index nodule has minimally decreased. 2. Progression of right inferior pubic ramus osseous metastasis. 3. No new sites of soft tissue metastasis identified. 4. Cystoprostatectomy, as before. 5. Aortic atherosclerosis (ICD10-I70.0), coronary artery atherosclerosis and emphysema (ICD10-J43.9). Electronically Signed   By: Abigail Miyamoto M.D.   On: 03/31/2018 11:57    Labs:  CBC: Recent Labs    04/10/18 2205 04/11/18 0458 04/12/18 0518 04/13/18 0537  WBC 4.7 3.6* 2.1* 1.2*  HGB 9.2* 7.7* 7.3* 7.4*  HCT 28.6* 24.5* 22.7* 23.3*  PLT 90* 69* 49* 43*    COAGS: Recent Labs    12/08/17 0542 02/07/18 1008  04/13/18 1545  INR 0.95 1.12 0.96    BMP: Recent Labs    04/10/18 2205 04/11/18 0458 04/12/18 0518 04/13/18 0537  NA 134* 136 138 140  K 3.6 3.5 3.7 3.8  CL 100 104 104 106  CO2 25 25 27 26   GLUCOSE 118* 131* 119* 94  BUN 17 14 11 11   CALCIUM 8.7* 8.1* 8.1* 8.3*  CREATININE 0.80 0.69 0.62 0.61  GFRNONAA >60 >60 >60 >60  GFRAA >60 >60 >60 >60    LIVER FUNCTION TESTS: Recent Labs    04/03/18 0822 04/10/18 0816 04/10/18 2205 04/11/18 0458  BILITOT 0.3 0.5 1.0 0.5  AST 12* 18 26 26   ALT 16 33 34 29  ALKPHOS 83 61 50 44  PROT 5.7* 5.3* 5.8* 4.9*  ALBUMIN 2.7* 2.5* 2.8* 2.3*    TUMOR MARKERS: No results for input(s): AFPTM, CEA, CA199, CHROMGRNA in the last 8760 hours.  Assessment and Plan: Bladder cancer/urothelial carcinoma Neutropenia, Sepsis Patient has been receiving chemotherapy via Port-A-Cath placed 02/07/18. Now with neutropenic fevers,  sepsis, suspected Port-A-Cath infection.  IR consulted for Port-A-Cath removal at the request of Dr. Carles Collet. Patient presents today in their usual state of health.  He has been NPO and is not currently on blood thinners.   WBC 1.2 INR 0/96 + peripheral Blood cultures-- Rothia mucilaginosa, Streptococcus  Risks and benefits of image guided port-a-catheter placement was discussed with the patient including, but not limited to bleeding, infection, pneumothorax, or fibrin sheath development and need for additional procedures.  All of the patient's questions were answered, patient is agreeable to proceed. Consent signed and in chart.  Thank you for this interesting consult.  I greatly enjoyed meeting [name] and look forward to participating in their care.  A copy of this report was sent to the requesting provider on this date.  Electronically Signed: Docia Barrier, PA 04/14/2018, 10:40 AM   I spent a total of    25 Minutes in face to face in clinical consultation, greater than 50% of which was counseling/coordinating care for neutropenic fever, sepsis.

## 2018-04-15 LAB — CBC WITH DIFFERENTIAL/PLATELET
Basophils Absolute: 0 10*3/uL (ref 0.0–0.1)
Basophils Relative: 0 %
Eosinophils Absolute: 0.1 10*3/uL (ref 0.0–0.7)
Eosinophils Relative: 1 %
HCT: 26.3 % — ABNORMAL LOW (ref 39.0–52.0)
Hemoglobin: 8.5 g/dL — ABNORMAL LOW (ref 13.0–17.0)
Lymphocytes Relative: 5 %
Lymphs Abs: 0.3 10*3/uL — ABNORMAL LOW (ref 0.7–4.0)
MCH: 30.5 pg (ref 26.0–34.0)
MCHC: 32.3 g/dL (ref 30.0–36.0)
MCV: 94.3 fL (ref 78.0–100.0)
Monocytes Absolute: 0.1 10*3/uL (ref 0.1–1.0)
Monocytes Relative: 2 %
Neutro Abs: 5.6 10*3/uL (ref 1.7–7.7)
Neutrophils Relative %: 92 %
Platelets: 24 10*3/uL — CL (ref 150–400)
RBC: 2.79 MIL/uL — ABNORMAL LOW (ref 4.22–5.81)
RDW: 19.8 % — ABNORMAL HIGH (ref 11.5–15.5)
WBC: 6.1 10*3/uL (ref 4.0–10.5)

## 2018-04-15 LAB — BASIC METABOLIC PANEL
Anion gap: 6 (ref 5–15)
BUN: 14 mg/dL (ref 8–23)
CO2: 28 mmol/L (ref 22–32)
Calcium: 8.3 mg/dL — ABNORMAL LOW (ref 8.9–10.3)
Chloride: 106 mmol/L (ref 98–111)
Creatinine, Ser: 0.6 mg/dL — ABNORMAL LOW (ref 0.61–1.24)
GFR calc Af Amer: >60 mL/min (ref >60)
GFR calc non Af Amer: >60 mL/min (ref >60)
Glucose, Bld: 94 mg/dL (ref 70–99)
Potassium: 3.6 mmol/L (ref 3.5–5.1)
Sodium: 140 mmol/L (ref 135–145)

## 2018-04-15 LAB — CULTURE, BLOOD (ROUTINE X 2)
Culture: NO GROWTH
Special Requests: ADEQUATE

## 2018-04-15 MED ORDER — POLYETHYLENE GLYCOL 3350 17 G PO PACK
17.0000 g | PACK | Freq: Two times a day (BID) | ORAL | Status: DC
  Administered 2018-04-15 – 2018-04-17 (×2): 17 g via ORAL
  Filled 2018-04-15 (×4): qty 1

## 2018-04-15 MED ORDER — SENNA 8.6 MG PO TABS
2.0000 | ORAL_TABLET | Freq: Every day | ORAL | Status: DC
  Administered 2018-04-15 – 2018-04-18 (×3): 17.2 mg via ORAL
  Filled 2018-04-15 (×4): qty 2

## 2018-04-15 NOTE — Progress Notes (Signed)
PROGRESS NOTE  [name] AYT:[id number] DOB: 02-23-1941 DOA: 04/10/2018 PCP: [name], MD   Brief History: 77 year old male with a history of stage IV bladder cancer/urothelial carcinoma with pulmonary and pelvic metastasis, GERD presents with a fever of 1 2.7 F that developed around 7 PM on 04/10/2018. The patient last received chemotherapy on the morning of 04/10/2018. He underwent a cystoscopy prostatectomy on 06/19/2017 with ileal conduit. Currently onCarboplatin and gemcitabine cycle 4managed by Dr. Alen Blew.He did not receive day 8 of the last cycle of chemotherapy because of pancytopenia.Upon presentation, the patient was noted to have WBC 4.7 with low-grade temperature 99.7 F and tachycardia. UA showed 21-50 WBC. Lactic acid was 0.73. The patient was started empirically on cefepime.  Assessment/Plan:  Febrile Neutropenia/SIRS -Last chemotherapy 04/10/2018 -source is bacteremia -Continue cefepime -Source is Rothia bacteremia -Urinalysis with pyuria, but sample comes from ileal conduit -now afebrile x 72 hours -personally reviewed CXR--no infiltrates -d/c granix   Bacteremia--Rothia -source likely port-a-cath vs endocarditiis -Continue cefepime -04/13/18 repeat blood culture = neg -port-a-cath removed 8/30 -04/14/18 TTE--EF--55%, TV appears nodular with echodensity, trivial TR -needs TEE on 04/17/18 or 04/18/18  EColi Bacteruria -significance is unclear presently as sample is obtained from ileal conduit  Metastatic urothelial carcinoma -Managed by Dr. Alen Blew -Currently onCarboplatin and gemcitabine cycle 4  Pancytopenia -related to recent chemo -transfused 2 units PRBC 8/30 -am CBC with diff -SCDs  Cancer associated pain -Continue home dose of hydromorphone  Fixed drug reaction -?related to chemo agents -sarna prn -benadryl prn itch    Disposition Plan: Home 9/3 if TEE is negative  Family Communication:Spouse  updatedat bedside8/31/19 --Total time spent 35 minutes. Greater than 50% spent face to face counseling and coordinating care.   Consultants:IR  Code Status: FULL  DVT Prophylaxis: SCDs   Procedures: As Listed in Progress Note Above  Antibiotics: Cefepime 8/27>>>      Subjective: Patient denies fevers, chills, headache, chest pain, dyspnea, nausea, vomiting, diarrhea, abdominal pain, dysuria, hematuria, hematochezia, and melena.   Objective: Vitals:   04/15/18 0245 04/15/18 0510 04/15/18 0534 04/15/18 1348  BP: 115/78 (!) 107/59 115/80 131/71  Pulse: 89 84 85 91  Resp:  16 18 16   Temp: 35.5 F (36.8 C) 97.8 F (36.6 C) 98.5 F (36.9 C) 98.4 F (36.9 C)  TempSrc: Oral Oral Oral Oral  SpO2: 99% 100% 99% 98%  Weight:      Height:        Intake/Output Summary (Last 24 hours) at 04/15/2018 1657 Last data filed at 04/15/2018 1200 Gross per 24 hour  Intake 555 ml  Output -  Net 555 ml   Weight change:  Exam:   General:  Pt is alert, follows commands appropriately, not in acute distress  HEENT: No icterus, No thrush, No neck mass, [place]/AT  Cardiovascular: RRR, S1/S2, no rubs, no gallops  Respiratory: CTA bilaterally, no wheezing, no crackles, no rhonchi  Abdomen: Soft/+BS, non tender, non distended, no guarding  Extremities: No edema, No lymphangitis, No petechiae, No rashes, no synovitis   Data Reviewed: I have personally reviewed following labs and imaging studies Basic Metabolic Panel: Recent Labs  Lab 04/10/18 2205 04/11/18 0458 04/12/18 0518 04/13/18 0537 04/15/18 0710  NA 134* 136 138 140 140  K 3.6 3.5 3.7 3.8 3.6  CL 100 104 104 106 106  CO2 25 25 27 26 28   GLUCOSE 118* 131* 119* 94 94  BUN 17 14 11 11  14  CREATININE 0.80 0.69 0.62 0.61 0.60*  CALCIUM 8.7* 8.1* 8.1* 8.3* 8.3*   Liver Function Tests: Recent Labs  Lab 04/10/18 0816 04/10/18 2205 04/11/18 0458  AST 18 26 26   ALT 33 34 29  ALKPHOS 61 50 44  BILITOT  0.5 1.0 0.5  PROT 5.3* 5.8* 4.9*  ALBUMIN 2.5* 2.8* 2.3*   No results for input(s): LIPASE, AMYLASE in the last 168 hours. No results for input(s): AMMONIA in the last 168 hours. Coagulation Profile: Recent Labs  Lab 04/13/18 1545  INR 0.96   CBC: Recent Labs  Lab 04/10/18 0816  04/10/18 2205 04/11/18 0458 04/12/18 0518 04/13/18 0537 04/14/18 1527 04/15/18 0710  WBC 3.9*   < > 4.7 3.6* 2.1* 1.2* 7.3 6.1  NEUTROABS 3.0  --  3.8  --   --  0.9* 7.0 5.6  HGB 9.1*   < > 9.2* 7.7* 7.3* 7.4* 6.7* 8.5*  HCT 28.9*  --  28.6* 24.5* 22.7* 23.3* 21.0* 26.3*  MCV 99.3*  --  99.0 100.4* 99.6 100.4* 100.0 94.3  PLT 98*   < > 90* 69* 49* 43* 32* 24*   < > = values in this interval not displayed.   Cardiac Enzymes: No results for input(s): CKTOTAL, CKMB, CKMBINDEX, TROPONINI in the last 168 hours. BNP: Invalid input(s): POCBNP CBG: No results for input(s): GLUCAP in the last 168 hours. HbA1C: No results for input(s): HGBA1C in the last 72 hours. Urine analysis:    Component Value Date/Time   COLORURINE YELLOW 04/10/2018 2217   APPEARANCEUR HAZY (A) 04/10/2018 2217   LABSPEC 1.010 04/10/2018 2217   PHURINE 6.0 04/10/2018 2217   GLUCOSEU NEGATIVE 04/10/2018 2217   HGBUR NEGATIVE 04/10/2018 2217   BILIRUBINUR NEGATIVE 04/10/2018 2217   KETONESUR NEGATIVE 04/10/2018 2217   PROTEINUR NEGATIVE 04/10/2018 2217   NITRITE POSITIVE (A) 04/10/2018 2217   LEUKOCYTESUR LARGE (A) 04/10/2018 2217   Sepsis Labs: @LABRCNTIP (procalcitonin:4,lacticidven:4) ) Recent Results (from the past 240 hour(s))  Urine Culture     Status: Abnormal   Collection Time: 04/10/18 10:17 PM  Result Value Ref Range Status   Specimen Description   Final    URINE, RANDOM Performed at [facility], [address] [place], [place] [address]    Special Requests   Final    NONE Performed at [facility], [address] [place], [place] [address]    Culture >=100,000 COLONIES/mL ESCHERICHIA COLI (A)  Final    Report Status 04/13/2018 FINAL  Final   Organism ID, Bacteria ESCHERICHIA COLI (A)  Final      Susceptibility   Escherichia coli - MIC*    AMPICILLIN 16 INTERMEDIATE Intermediate     CEFAZOLIN <=4 SENSITIVE Sensitive     CEFTRIAXONE <=1 SENSITIVE Sensitive     CIPROFLOXACIN >=4 RESISTANT Resistant     GENTAMICIN <=1 SENSITIVE Sensitive     IMIPENEM <=0.25 SENSITIVE Sensitive     NITROFURANTOIN <=16 SENSITIVE Sensitive     TRIMETH/SULFA <=20 SENSITIVE Sensitive     AMPICILLIN/SULBACTAM 4 SENSITIVE Sensitive     PIP/TAZO <=4 SENSITIVE Sensitive     Extended ESBL NEGATIVE Sensitive     * >=100,000 COLONIES/mL ESCHERICHIA COLI  Blood culture (routine x 2)     Status: Abnormal   Collection Time: 04/10/18 11:06 PM  Result Value Ref Range Status   Specimen Description   Final    LEFT ANTECUBITAL Performed at [facility], [address] [place], [place] [address]    Special Requests   Final  BOTTLES DRAWN AEROBIC AND ANAEROBIC Blood Culture adequate volume Performed at [facility], [address], [place], [place] [address]    Culture  Setup Time   Final    GRAM POSITIVE COCCI ANAEROBIC BOTTLE Gram Stain Report Called to,Read Back By and Verified With: ALSTON C. AT [id number] ON [id number] BY THOMPSON S. CRITICAL RESULT CALLED TO, READ BACK BY AND VERIFIED WITH: C ALSTON RN 04/11/18 1817 JDW    Culture (A)  Final    ROTHIA MUCILAGINOSA Standardized susceptibility testing for this organism is not available. STREPTOCOCCUS SPECIES IN BCID DID NOT GROW IN CULTURE Performed at [facility] [address] [place], [place] [address]    Report Status 04/13/2018 FINAL  Final  Blood Culture ID Panel (Reflexed)     Status: Abnormal   Collection Time: 04/10/18 11:06 PM  Result Value Ref Range Status   Enterococcus species NOT DETECTED NOT DETECTED Final   Listeria monocytogenes NOT DETECTED NOT DETECTED Final   Staphylococcus species NOT DETECTED NOT DETECTED Final   Staphylococcus aureus  NOT DETECTED NOT DETECTED Final   Streptococcus species DETECTED (A) NOT DETECTED Final    Comment: Not Enterococcus species, Streptococcus agalactiae, Streptococcus pyogenes, or Streptococcus pneumoniae. CRITICAL RESULT CALLED TO, READ BACK BY AND VERIFIED WITH: C ALSTON RN 04/11/18 1817 JDW    Streptococcus agalactiae NOT DETECTED NOT DETECTED Final   Streptococcus pneumoniae NOT DETECTED NOT DETECTED Final   Streptococcus pyogenes NOT DETECTED NOT DETECTED Final   Acinetobacter baumannii NOT DETECTED NOT DETECTED Final   Enterobacteriaceae species NOT DETECTED NOT DETECTED Final   Enterobacter cloacae complex NOT DETECTED NOT DETECTED Final   Escherichia coli NOT DETECTED NOT DETECTED Final   Klebsiella oxytoca NOT DETECTED NOT DETECTED Final   Klebsiella pneumoniae NOT DETECTED NOT DETECTED Final   Proteus species NOT DETECTED NOT DETECTED Final   Serratia marcescens NOT DETECTED NOT DETECTED Final   Haemophilus influenzae NOT DETECTED NOT DETECTED Final   Neisseria meningitidis NOT DETECTED NOT DETECTED Final   Pseudomonas aeruginosa NOT DETECTED NOT DETECTED Final   Candida albicans NOT DETECTED NOT DETECTED Final   Candida glabrata NOT DETECTED NOT DETECTED Final   Candida krusei NOT DETECTED NOT DETECTED Final   Candida parapsilosis NOT DETECTED NOT DETECTED Final   Candida tropicalis NOT DETECTED NOT DETECTED Final    Comment: Performed at [facility], [place] [address], [place], [place] [address]  Blood culture (routine x 2)     Status: None   Collection Time: 04/10/18 11:40 PM  Result Value Ref Range Status   Specimen Description BLOOD LEFT HAND  Final   Special Requests   Final    BOTTLES DRAWN AEROBIC ONLY Blood Culture adequate volume   Culture   Final    NO GROWTH 5 DAYS Performed at [facility], [address], [place], [place] [address]    Report Status 04/15/2018 FINAL  Final  Culture, blood (Routine X 2) w Reflex to ID Panel     Status: None (Preliminary  result)   Collection Time: 04/13/18  1:58 PM  Result Value Ref Range Status   Specimen Description LEFT ANTECUBITAL  Final   Special Requests   Final    BOTTLES DRAWN AEROBIC AND ANAEROBIC Blood Culture adequate volume   Culture   Final    NO GROWTH 2 DAYS Performed at [facility], [address], [place], [place] [address]    Report Status PENDING  Incomplete  Culture, blood (Routine X 2)  w Reflex to ID Panel     Status: None (Preliminary result)   Collection Time: 04/13/18  2:03 PM  Result Value Ref Range Status   Specimen Description BLOOD CENTRAL LINE  Final   Special Requests   Final    BOTTLES DRAWN AEROBIC AND ANAEROBIC Blood Culture adequate volume   Culture   Final    NO GROWTH 2 DAYS Performed at [facility], [address], [place], [place] [address]    Report Status PENDING  Incomplete     Scheduled Meds: . sodium chloride   Intravenous Once  . docusate sodium  100 mg Oral BID  . feeding supplement (ENSURE ENLIVE)  237 mL Oral BID BM  . polyethylene glycol  17 g Oral Daily   Continuous Infusions: . ceFEPime (MAXIPIME) IV Stopped (04/15/18 1030)    Procedures/Studies: Dg Chest 2 View  Result Date: 04/10/2018 CLINICAL DATA:  Fever. Chemotherapy today. History of metastatic bladder cancer. EXAM: CHEST - 2 VIEW COMPARISON:  CT chest March 31, 2018 in chest radiograph January 25, 2018 FINDINGS: Heart size is normal. Mediastinal silhouette is not suspicious. Known pulmonary nodules less conspicuous due to plain radiography. No pleural effusion or focal consolidation. No pneumothorax. Biapical pleural thickening. Single-lumen RIGHT chest Port-A-Cath with distal tip projecting mid superior vena cava. No pneumothorax. Soft tissue planes and included osseous structures are non suspicious. IMPRESSION: 1. No acute cardiopulmonary process. Electronically Signed   By: Elon Alas M.D.   On: 04/10/2018 22:01   Ct Chest W Contrast  Result Date: 03/31/2018 CLINICAL DATA:   Bladder cancer with cystectomy in 2016. Right hip metastasis. EXAM: CT CHEST, ABDOMEN, AND PELVIS WITH CONTRAST TECHNIQUE: Multidetector CT imaging of the chest, abdomen and pelvis was performed following the standard protocol during bolus administration of intravenous contrast. CONTRAST:  110mL OMNIPAQUE IOHEXOL 300 MG/ML  SOLN COMPARISON:  Chest CT 01/03/2018.  Abdominopelvic CT 11/25/2017. FINDINGS: CT CHEST FINDINGS Cardiovascular: A right Port-A-Cath which terminates at the low SVC. Aortic and branch vessel atherosclerosis. Normal heart size, without pericardial effusion. Multivessel coronary artery atherosclerosis. Mediastinum/Nodes: No supraclavicular adenopathy. No mediastinal or hilar adenopathy. Tiny hiatal hernia. Lungs/Pleura: No pleural fluid. Focal right-sided pleural thickening medially is similar. Moderate centrilobular emphysema with lower lobe predominant bronchial wall thickening. Bilateral pulmonary nodules. Index left upper lobe pulmonary nodule measures 12 x 12 mm on 41/6 and is unchanged. Index anterior right upper lobe pulmonary nodule measures 8 x 5 mm on 63/6 and is slightly decreased from 9 x 8 mm on the prior. The remainder of the smaller pulmonary nodules are similar. Musculoskeletal: No acute osseous abnormality. CT ABDOMEN PELVIS FINDINGS Hepatobiliary: Bilateral hepatic cysts. Normal gallbladder, without biliary ductal dilatation. Pancreas: Pancreatic parenchymal versus vascular calcifications adjacent the common duct within the head. No duct dilatation or acute inflammation. Spleen: Normal in size, without focal abnormality. Adrenals/Urinary Tract: Normal adrenal glands. Favor early contrast excretion in the left kidney, given absence of stones on prior noncontrast exam. Too small to characterize lesions in both kidneys. Good renal collecting system opacification on delayed images. Suboptimal proximal to mid right ureteric opacification on delayed images. No ureteric filling defect  identified. Cystectomy and ileal conduit creation, without acute complication. Stomach/Bowel: Normal remainder of the stomach. Otherwise normal large and small bowel. Vascular/Lymphatic: Aortic and branch vessel atherosclerosis. Multiple renal arteries bilaterally. No abdominopelvic adenopathy. Reproductive: Prostatectomy. Other: No significant free fluid. Musculoskeletal: Right inferior pubic ramus lytic lesion with soft tissue component. Measures on the order of 5.2 x 5.1  cm on 92/9. Compare 4.0 by 3.5 cm on the prior exam (when remeasured). IMPRESSION: 1. Redemonstration of bilateral pulmonary metastasis. The majority of nodules are similar in size. One index nodule has minimally decreased. 2. Progression of right inferior pubic ramus osseous metastasis. 3. No new sites of soft tissue metastasis identified. 4. Cystoprostatectomy, as before. 5. Aortic atherosclerosis (ICD10-I70.0), coronary artery atherosclerosis and emphysema (ICD10-J43.9). Electronically Signed   By: Abigail Miyamoto M.D.   On: 03/31/2018 11:57   Ct Abdomen Pelvis W Contrast  Result Date: 03/31/2018 CLINICAL DATA:  Bladder cancer with cystectomy in 2016. Right hip metastasis. EXAM: CT CHEST, ABDOMEN, AND PELVIS WITH CONTRAST TECHNIQUE: Multidetector CT imaging of the chest, abdomen and pelvis was performed following the standard protocol during bolus administration of intravenous contrast. CONTRAST:  131mL OMNIPAQUE IOHEXOL 300 MG/ML  SOLN COMPARISON:  Chest CT 01/03/2018.  Abdominopelvic CT 11/25/2017. FINDINGS: CT CHEST FINDINGS Cardiovascular: A right Port-A-Cath which terminates at the low SVC. Aortic and branch vessel atherosclerosis. Normal heart size, without pericardial effusion. Multivessel coronary artery atherosclerosis. Mediastinum/Nodes: No supraclavicular adenopathy. No mediastinal or hilar adenopathy. Tiny hiatal hernia. Lungs/Pleura: No pleural fluid. Focal right-sided pleural thickening medially is similar. Moderate  centrilobular emphysema with lower lobe predominant bronchial wall thickening. Bilateral pulmonary nodules. Index left upper lobe pulmonary nodule measures 12 x 12 mm on 41/6 and is unchanged. Index anterior right upper lobe pulmonary nodule measures 8 x 5 mm on 63/6 and is slightly decreased from 9 x 8 mm on the prior. The remainder of the smaller pulmonary nodules are similar. Musculoskeletal: No acute osseous abnormality. CT ABDOMEN PELVIS FINDINGS Hepatobiliary: Bilateral hepatic cysts. Normal gallbladder, without biliary ductal dilatation. Pancreas: Pancreatic parenchymal versus vascular calcifications adjacent the common duct within the head. No duct dilatation or acute inflammation. Spleen: Normal in size, without focal abnormality. Adrenals/Urinary Tract: Normal adrenal glands. Favor early contrast excretion in the left kidney, given absence of stones on prior noncontrast exam. Too small to characterize lesions in both kidneys. Good renal collecting system opacification on delayed images. Suboptimal proximal to mid right ureteric opacification on delayed images. No ureteric filling defect identified. Cystectomy and ileal conduit creation, without acute complication. Stomach/Bowel: Normal remainder of the stomach. Otherwise normal large and small bowel. Vascular/Lymphatic: Aortic and branch vessel atherosclerosis. Multiple renal arteries bilaterally. No abdominopelvic adenopathy. Reproductive: Prostatectomy. Other: No significant free fluid. Musculoskeletal: Right inferior pubic ramus lytic lesion with soft tissue component. Measures on the order of 5.2 x 5.1 cm on 92/9. Compare 4.0 by 3.5 cm on the prior exam (when remeasured). IMPRESSION: 1. Redemonstration of bilateral pulmonary metastasis. The majority of nodules are similar in size. One index nodule has minimally decreased. 2. Progression of right inferior pubic ramus osseous metastasis. 3. No new sites of soft tissue metastasis identified. 4.  Cystoprostatectomy, as before. 5. Aortic atherosclerosis (ICD10-I70.0), coronary artery atherosclerosis and emphysema (ICD10-J43.9). Electronically Signed   By: Abigail Miyamoto M.D.   On: 03/31/2018 11:57   Ir Removal [employer] W/ [place] W/o Fl Mod Sed  Result Date: 04/14/2018 CLINICAL DATA:  Bacteremia, concern for infection EXAM: REMOVAL OF IMPLANTED TUNNELED PORT-A-CATH MEDICATIONS: Ancef 2 g. The antibiotic was administered within 1 hour prior to the start of the procedure. ANESTHESIA/SEDATION: Moderate (conscious) sedation was employed during this procedure. A total of Versed 1.0 mg and Fentanyl 50 mcg was administered intravenously. Moderate Sedation Time: 10 minutes. The patient's level of consciousness and vital signs were monitored continuously by radiology nursing throughout the procedure under my  direct supervision. FLUOROSCOPY TIME:  None PROCEDURE: Informed written consent was obtained from the patient after a discussion of the risk, benefits and alternatives to the procedure. The patient was positioned supine on the fluoroscopy table and the right chest Port-A-Cath site was prepped with chlorhexidine. A sterile gown and gloves were worn during the procedure. Local anesthesia was provided with 1% lidocaine with epinephrine. A timeout was performed prior to the initiation of the procedure. An incision was made overlying the Port-A-Cath with a #15 scalpel. Utilizing sharp and blunt dissection, the Port-A-Cath was removed completely. The pocked was irrigated with sterile saline. Wound closure was performed with subcutaneous 2-0 Vicryl, subcuticular 4-0 Vicryl, Dermabond and Steri-Strips. A dressing was placed. The patient tolerated the procedure well without immediate post procedural complication. FINDINGS: Successful removal of implant Port-A-Cath without immediate post procedural complication. IMPRESSION: Successful removal of implanted Port-A-Cath. Electronically Signed   By: Jerilynn Mages.  Shick M.D.   On:  04/14/2018 11:48    Orson Eva, DO  [facility] Pager [phone]  If 7PM-7AM, please contact night-coverage [email or web address] Password [email or web address] 04/15/2018, 4:57 PM   LOS: 4 days

## 2018-04-15 NOTE — Progress Notes (Signed)
CRITICAL VALUE ALERT  Critical Value:  Platelets 24  Date & Time Notied:  04/15/2018 0815  Provider Notified: Dr. Carles Collet  Orders Received/Actions taken: none

## 2018-04-15 NOTE — Progress Notes (Signed)
Gave patient handout on thrombocytopenia.

## 2018-04-16 DIAGNOSIS — R319 Hematuria, unspecified: Secondary | ICD-10-CM

## 2018-04-16 DIAGNOSIS — N390 Urinary tract infection, site not specified: Secondary | ICD-10-CM

## 2018-04-16 LAB — BPAM RBC
Blood Product Expiration Date: 201909122359
Blood Product Expiration Date: 201909122359
ISSUE DATE / TIME: 201908302157
ISSUE DATE / TIME: 201908310202
Unit Type and Rh: 6200
Unit Type and Rh: 6200

## 2018-04-16 LAB — CBC
HCT: 28.1 % — ABNORMAL LOW (ref 39.0–52.0)
Hemoglobin: 9.1 g/dL — ABNORMAL LOW (ref 13.0–17.0)
MCH: 30.2 pg (ref 26.0–34.0)
MCHC: 32.4 g/dL (ref 30.0–36.0)
MCV: 93.4 fL (ref 78.0–100.0)
Platelets: 21 10*3/uL — CL (ref 150–400)
RBC: 3.01 MIL/uL — ABNORMAL LOW (ref 4.22–5.81)
RDW: 18.5 % — ABNORMAL HIGH (ref 11.5–15.5)
WBC: 3.8 10*3/uL — ABNORMAL LOW (ref 4.0–10.5)

## 2018-04-16 LAB — TYPE AND SCREEN
ABO/RH(D): A POS
Antibody Screen: NEGATIVE
Unit division: 0
Unit division: 0

## 2018-04-16 NOTE — Progress Notes (Signed)
CRITICAL VALUE ALERT  Critical Value: Platelet 21  Date & Time Notied:  04/16/2018 1606  Provider Notified: Tat, MD  Orders Received/Actions taken: awaiting further instructions

## 2018-04-16 NOTE — Progress Notes (Signed)
PROGRESS NOTE  [name] LFY:[id number] DOB: June 13, 1941 DOA: 04/10/2018 PCP: [name], MD Brief History: 77 year old male with a history of stage IV bladder cancer/urothelial carcinoma with pulmonary and pelvic metastasis, GERD presents with a fever of 1 2.7 F that developed around 7 PM on 04/10/2018. The patient last received chemotherapy on the morning of 04/10/2018. He underwent a cystoscopy prostatectomy on 06/19/2017 with ileal conduit. Currently onCarboplatin and gemcitabine cycle 44managed by Dr. Alen Blew.He did not receive day 8 of the last cycle of chemotherapy because of pancytopenia.Upon presentation, the patient was noted to have WBC 4.7 with low-grade temperature 99.7 F and tachycardia. UA showed 21-50 WBC. Lactic acid was 0.73. The patient was started empirically on cefepime.  Assessment/Plan:  Febrile Neutropenia/SIRS -Last chemotherapy 04/10/2018 -source is bacteremia -Continue cefepime -Source is Rothia bacteremia -Urinalysis with pyuria, but sample comes from ileal conduit -now afebrile x96hours -personally reviewed CXR--no infiltrates -d/cgranix   Bacteremia--Rothia -source likely port-a-cath vs endocarditiis -Continue cefepime -04/13/18 repeat blood culture = neg -port-a-cath removed8/30 -04/14/18 TTE--EF--55%, TV appears nodular with echodensity, trivial TR -needs TEE on 04/17/18 or 04/18/18  EColi Bacteruria -significance is unclear presently as sample is obtained from ileal conduit  Metastatic urothelial carcinoma -Managed by Dr. Alen Blew -Currently onCarboplatin and gemcitabine cycle 4  Pancytopenia -related to recent chemo -transfused 2 units PRBC 8/30 -am CBC with diff -SCDs  Cancer associated pain -Continue home dose of hydromorphone  Fixed drug reaction -?related to chemo agents -sarna prn -benadryl prn itch    Disposition Plan: Home 9/3 if TEE is negative  Family Communication:Spouse  updatedat bedside 04/16/18--Total time spent48minutes. Greater than 50% spent face to face counseling and coordinating care.   Consultants:IR  Code Status: FULL  DVT Prophylaxis: SCDs   Procedures: As Listed in Progress Note Above  Antibiotics: Cefepime 8/27>>>     Subjective: Patient denies fevers, chills, headache, chest pain, dyspnea, nausea, vomiting, diarrhea, abdominal pain, dysuria, hematuria, hematochezia, and melena.   Objective: Vitals:   04/15/18 1348 04/15/18 2123 04/16/18 0532 04/16/18 1337  BP: 131/71 134/77 135/76 116/89  Pulse: 91 90 82 87  Resp: 16 18 18 18   Temp: 98.4 F (36.9 C) 98.7 F (37.1 C) 98 F (36.7 C) 98.6 F (37 C)  TempSrc: Oral Oral Oral Oral  SpO2: 98% 100% 99% 100%  Weight:      Height:        Intake/Output Summary (Last 24 hours) at 04/16/2018 1527 Last data filed at 04/16/2018 1200 Gross per 24 hour  Intake 1373.33 ml  Output 800 ml  Net 573.33 ml   Weight change:  Exam:   General:  Pt is alert, follows commands appropriately, not in acute distress  HEENT: No icterus, No thrush, No neck mass, [place]/AT  Cardiovascular: RRR, S1/S2, no rubs, no gallops  Respiratory: CTA bilaterally, no wheezing, no crackles, no rhonchi  Abdomen: Soft/+BS, non tender, non distended, no guarding  Extremities: No edema, No lymphangitis, No petechiae, No rashes, no synovitis   Data Reviewed: I have personally reviewed following labs and imaging studies Basic Metabolic Panel: Recent Labs  Lab 04/10/18 2205 04/11/18 0458 04/12/18 0518 04/13/18 0537 04/15/18 0710  NA 134* 136 138 140 140  K 3.6 3.5 3.7 3.8 3.6  CL 100 104 104 106 106  CO2 25 25 27 26 28   GLUCOSE 118* 131* 119* 94 94  BUN 17 14 11 11 14   CREATININE 0.80 0.69 0.62 0.61 0.60*  CALCIUM  8.7* 8.1* 8.1* 8.3* 8.3*   Liver Function Tests: Recent Labs  Lab 04/10/18 0816 04/10/18 2205 04/11/18 0458  AST 18 26 26   ALT 33 34 29  ALKPHOS 61 50 44  BILITOT  0.5 1.0 0.5  PROT 5.3* 5.8* 4.9*  ALBUMIN 2.5* 2.8* 2.3*   No results for input(s): LIPASE, AMYLASE in the last 168 hours. No results for input(s): AMMONIA in the last 168 hours. Coagulation Profile: Recent Labs  Lab 04/13/18 1545  INR 0.96   CBC: Recent Labs  Lab 04/10/18 0816  04/10/18 2205 04/11/18 0458 04/12/18 0518 04/13/18 0537 04/14/18 1527 04/15/18 0710  WBC 3.9*   < > 4.7 3.6* 2.1* 1.2* 7.3 6.1  NEUTROABS 3.0  --  3.8  --   --  0.9* 7.0 5.6  HGB 9.1*   < > 9.2* 7.7* 7.3* 7.4* 6.7* 8.5*  HCT 28.9*  --  28.6* 24.5* 22.7* 23.3* 21.0* 26.3*  MCV 99.3*  --  99.0 100.4* 99.6 100.4* 100.0 94.3  PLT 98*   < > 90* 69* 49* 43* 32* 24*   < > = values in this interval not displayed.   Cardiac Enzymes: No results for input(s): CKTOTAL, CKMB, CKMBINDEX, TROPONINI in the last 168 hours. BNP: Invalid input(s): POCBNP CBG: No results for input(s): GLUCAP in the last 168 hours. HbA1C: No results for input(s): HGBA1C in the last 72 hours. Urine analysis:    Component Value Date/Time   COLORURINE YELLOW 04/10/2018 2217   APPEARANCEUR HAZY (A) 04/10/2018 2217   LABSPEC 1.010 04/10/2018 2217   PHURINE 6.0 04/10/2018 2217   GLUCOSEU NEGATIVE 04/10/2018 2217   HGBUR NEGATIVE 04/10/2018 2217   BILIRUBINUR NEGATIVE 04/10/2018 2217   KETONESUR NEGATIVE 04/10/2018 2217   PROTEINUR NEGATIVE 04/10/2018 2217   NITRITE POSITIVE (A) 04/10/2018 2217   LEUKOCYTESUR LARGE (A) 04/10/2018 2217   Sepsis Labs: @LABRCNTIP (procalcitonin:4,lacticidven:4) ) Recent Results (from the past 240 hour(s))  Urine Culture     Status: Abnormal   Collection Time: 04/10/18 10:17 PM  Result Value Ref Range Status   Specimen Description   Final    URINE, RANDOM Performed at [facility], [address], [place], [place] [address]    Special Requests   Final    NONE Performed at [facility], [address] [place], [place] [address]    Culture >=100,000 COLONIES/mL ESCHERICHIA COLI (A)  Final    Report Status 04/13/2018 FINAL  Final   Organism ID, Bacteria ESCHERICHIA COLI (A)  Final      Susceptibility   Escherichia coli - MIC*    AMPICILLIN 16 INTERMEDIATE Intermediate     CEFAZOLIN <=4 SENSITIVE Sensitive     CEFTRIAXONE <=1 SENSITIVE Sensitive     CIPROFLOXACIN >=4 RESISTANT Resistant     GENTAMICIN <=1 SENSITIVE Sensitive     IMIPENEM <=0.25 SENSITIVE Sensitive     NITROFURANTOIN <=16 SENSITIVE Sensitive     TRIMETH/SULFA <=20 SENSITIVE Sensitive     AMPICILLIN/SULBACTAM 4 SENSITIVE Sensitive     PIP/TAZO <=4 SENSITIVE Sensitive     Extended ESBL NEGATIVE Sensitive     * >=100,000 COLONIES/mL ESCHERICHIA COLI  Blood culture (routine x 2)     Status: Abnormal   Collection Time: 04/10/18 11:06 PM  Result Value Ref Range Status   Specimen Description   Final    LEFT ANTECUBITAL Performed at [facility], [address] [place], [place] [address]    Special Requests   Final    BOTTLES DRAWN AEROBIC AND ANAEROBIC  Blood Culture adequate volume Performed at [facility], [address], [place], [place] [address]    Culture  Setup Time   Final    GRAM POSITIVE COCCI ANAEROBIC BOTTLE Gram Stain Report Called to,Read Back By and Verified With: ALSTON C. AT [id number] ON [id number] BY THOMPSON S. CRITICAL RESULT CALLED TO, READ BACK BY AND VERIFIED WITH: C ALSTON RN 04/11/18 1817 JDW    Culture (A)  Final    ROTHIA MUCILAGINOSA Standardized susceptibility testing for this organism is not available. STREPTOCOCCUS SPECIES IN BCID DID NOT GROW IN CULTURE Performed at [facility] [address] [place], [place] [address]    Report Status 04/13/2018 FINAL  Final  Blood Culture ID Panel (Reflexed)     Status: Abnormal   Collection Time: 04/10/18 11:06 PM  Result Value Ref Range Status   Enterococcus species NOT DETECTED NOT DETECTED Final   Listeria monocytogenes NOT DETECTED NOT DETECTED Final   Staphylococcus species NOT DETECTED NOT DETECTED Final   Staphylococcus aureus  NOT DETECTED NOT DETECTED Final   Streptococcus species DETECTED (A) NOT DETECTED Final    Comment: Not Enterococcus species, Streptococcus agalactiae, Streptococcus pyogenes, or Streptococcus pneumoniae. CRITICAL RESULT CALLED TO, READ BACK BY AND VERIFIED WITH: C ALSTON RN 04/11/18 1817 JDW    Streptococcus agalactiae NOT DETECTED NOT DETECTED Final   Streptococcus pneumoniae NOT DETECTED NOT DETECTED Final   Streptococcus pyogenes NOT DETECTED NOT DETECTED Final   Acinetobacter baumannii NOT DETECTED NOT DETECTED Final   Enterobacteriaceae species NOT DETECTED NOT DETECTED Final   Enterobacter cloacae complex NOT DETECTED NOT DETECTED Final   Escherichia coli NOT DETECTED NOT DETECTED Final   Klebsiella oxytoca NOT DETECTED NOT DETECTED Final   Klebsiella pneumoniae NOT DETECTED NOT DETECTED Final   Proteus species NOT DETECTED NOT DETECTED Final   Serratia marcescens NOT DETECTED NOT DETECTED Final   Haemophilus influenzae NOT DETECTED NOT DETECTED Final   Neisseria meningitidis NOT DETECTED NOT DETECTED Final   Pseudomonas aeruginosa NOT DETECTED NOT DETECTED Final   Candida albicans NOT DETECTED NOT DETECTED Final   Candida glabrata NOT DETECTED NOT DETECTED Final   Candida krusei NOT DETECTED NOT DETECTED Final   Candida parapsilosis NOT DETECTED NOT DETECTED Final   Candida tropicalis NOT DETECTED NOT DETECTED Final    Comment: Performed at [facility], [place] [address], [place], [place] [address]  Blood culture (routine x 2)     Status: None   Collection Time: 04/10/18 11:40 PM  Result Value Ref Range Status   Specimen Description BLOOD LEFT HAND  Final   Special Requests   Final    BOTTLES DRAWN AEROBIC ONLY Blood Culture adequate volume   Culture   Final    NO GROWTH 5 DAYS Performed at [facility], [address] [place], [place] [address]    Report Status 04/15/2018 FINAL  Final  Culture, blood (Routine X 2) w Reflex to ID Panel     Status: None (Preliminary  result)   Collection Time: 04/13/18  1:58 PM  Result Value Ref Range Status   Specimen Description LEFT ANTECUBITAL  Final   Special Requests   Final    BOTTLES DRAWN AEROBIC AND ANAEROBIC Blood Culture adequate volume   Culture   Final    NO GROWTH 3 DAYS Performed at [facility], [address] [place], [place] [address]    Report Status PENDING  Incomplete  Culture, blood (Routine X 2) w Reflex to ID Panel  Status: None (Preliminary result)   Collection Time: 04/13/18  2:03 PM  Result Value Ref Range Status   Specimen Description BLOOD CENTRAL LINE  Final   Special Requests   Final    BOTTLES DRAWN AEROBIC AND ANAEROBIC Blood Culture adequate volume   Culture   Final    NO GROWTH 3 DAYS Performed at [facility], [address], [place], [place] [address]    Report Status PENDING  Incomplete     Scheduled Meds: . sodium chloride   Intravenous Once  . feeding supplement (ENSURE ENLIVE)  237 mL Oral BID BM  . polyethylene glycol  17 g Oral BID  . senna  2 tablet Oral Daily   Continuous Infusions: . ceFEPime (MAXIPIME) IV Stopped (04/16/18 8938)    Procedures/Studies: Dg Chest 2 View  Result Date: 04/10/2018 CLINICAL DATA:  Fever. Chemotherapy today. History of metastatic bladder cancer. EXAM: CHEST - 2 VIEW COMPARISON:  CT chest March 31, 2018 in chest radiograph January 25, 2018 FINDINGS: Heart size is normal. Mediastinal silhouette is not suspicious. Known pulmonary nodules less conspicuous due to plain radiography. No pleural effusion or focal consolidation. No pneumothorax. Biapical pleural thickening. Single-lumen RIGHT chest Port-A-Cath with distal tip projecting mid superior vena cava. No pneumothorax. Soft tissue planes and included osseous structures are non suspicious. IMPRESSION: 1. No acute cardiopulmonary process. Electronically Signed   By: Elon Alas M.D.   On: 04/10/2018 22:01   Ct Chest W Contrast  Result Date: 03/31/2018 CLINICAL DATA:  Bladder  cancer with cystectomy in 2016. Right hip metastasis. EXAM: CT CHEST, ABDOMEN, AND PELVIS WITH CONTRAST TECHNIQUE: Multidetector CT imaging of the chest, abdomen and pelvis was performed following the standard protocol during bolus administration of intravenous contrast. CONTRAST:  162mL OMNIPAQUE IOHEXOL 300 MG/ML  SOLN COMPARISON:  Chest CT 01/03/2018.  Abdominopelvic CT 11/25/2017. FINDINGS: CT CHEST FINDINGS Cardiovascular: A right Port-A-Cath which terminates at the low SVC. Aortic and branch vessel atherosclerosis. Normal heart size, without pericardial effusion. Multivessel coronary artery atherosclerosis. Mediastinum/Nodes: No supraclavicular adenopathy. No mediastinal or hilar adenopathy. Tiny hiatal hernia. Lungs/Pleura: No pleural fluid. Focal right-sided pleural thickening medially is similar. Moderate centrilobular emphysema with lower lobe predominant bronchial wall thickening. Bilateral pulmonary nodules. Index left upper lobe pulmonary nodule measures 12 x 12 mm on 41/6 and is unchanged. Index anterior right upper lobe pulmonary nodule measures 8 x 5 mm on 63/6 and is slightly decreased from 9 x 8 mm on the prior. The remainder of the smaller pulmonary nodules are similar. Musculoskeletal: No acute osseous abnormality. CT ABDOMEN PELVIS FINDINGS Hepatobiliary: Bilateral hepatic cysts. Normal gallbladder, without biliary ductal dilatation. Pancreas: Pancreatic parenchymal versus vascular calcifications adjacent the common duct within the head. No duct dilatation or acute inflammation. Spleen: Normal in size, without focal abnormality. Adrenals/Urinary Tract: Normal adrenal glands. Favor early contrast excretion in the left kidney, given absence of stones on prior noncontrast exam. Too small to characterize lesions in both kidneys. Good renal collecting system opacification on delayed images. Suboptimal proximal to mid right ureteric opacification on delayed images. No ureteric filling defect  identified. Cystectomy and ileal conduit creation, without acute complication. Stomach/Bowel: Normal remainder of the stomach. Otherwise normal large and small bowel. Vascular/Lymphatic: Aortic and branch vessel atherosclerosis. Multiple renal arteries bilaterally. No abdominopelvic adenopathy. Reproductive: Prostatectomy. Other: No significant free fluid. Musculoskeletal: Right inferior pubic ramus lytic lesion with soft tissue component. Measures on the order of 5.2 x 5.1 cm on 92/9. Compare 4.0 by 3.5 cm on the  prior exam (when remeasured). IMPRESSION: 1. Redemonstration of bilateral pulmonary metastasis. The majority of nodules are similar in size. One index nodule has minimally decreased. 2. Progression of right inferior pubic ramus osseous metastasis. 3. No new sites of soft tissue metastasis identified. 4. Cystoprostatectomy, as before. 5. Aortic atherosclerosis (ICD10-I70.0), coronary artery atherosclerosis and emphysema (ICD10-J43.9). Electronically Signed   By: Abigail Miyamoto M.D.   On: 03/31/2018 11:57   Ct Abdomen Pelvis W Contrast  Result Date: 03/31/2018 CLINICAL DATA:  Bladder cancer with cystectomy in 2016. Right hip metastasis. EXAM: CT CHEST, ABDOMEN, AND PELVIS WITH CONTRAST TECHNIQUE: Multidetector CT imaging of the chest, abdomen and pelvis was performed following the standard protocol during bolus administration of intravenous contrast. CONTRAST:  175mL OMNIPAQUE IOHEXOL 300 MG/ML  SOLN COMPARISON:  Chest CT 01/03/2018.  Abdominopelvic CT 11/25/2017. FINDINGS: CT CHEST FINDINGS Cardiovascular: A right Port-A-Cath which terminates at the low SVC. Aortic and branch vessel atherosclerosis. Normal heart size, without pericardial effusion. Multivessel coronary artery atherosclerosis. Mediastinum/Nodes: No supraclavicular adenopathy. No mediastinal or hilar adenopathy. Tiny hiatal hernia. Lungs/Pleura: No pleural fluid. Focal right-sided pleural thickening medially is similar. Moderate  centrilobular emphysema with lower lobe predominant bronchial wall thickening. Bilateral pulmonary nodules. Index left upper lobe pulmonary nodule measures 12 x 12 mm on 41/6 and is unchanged. Index anterior right upper lobe pulmonary nodule measures 8 x 5 mm on 63/6 and is slightly decreased from 9 x 8 mm on the prior. The remainder of the smaller pulmonary nodules are similar. Musculoskeletal: No acute osseous abnormality. CT ABDOMEN PELVIS FINDINGS Hepatobiliary: Bilateral hepatic cysts. Normal gallbladder, without biliary ductal dilatation. Pancreas: Pancreatic parenchymal versus vascular calcifications adjacent the common duct within the head. No duct dilatation or acute inflammation. Spleen: Normal in size, without focal abnormality. Adrenals/Urinary Tract: Normal adrenal glands. Favor early contrast excretion in the left kidney, given absence of stones on prior noncontrast exam. Too small to characterize lesions in both kidneys. Good renal collecting system opacification on delayed images. Suboptimal proximal to mid right ureteric opacification on delayed images. No ureteric filling defect identified. Cystectomy and ileal conduit creation, without acute complication. Stomach/Bowel: Normal remainder of the stomach. Otherwise normal large and small bowel. Vascular/Lymphatic: Aortic and branch vessel atherosclerosis. Multiple renal arteries bilaterally. No abdominopelvic adenopathy. Reproductive: Prostatectomy. Other: No significant free fluid. Musculoskeletal: Right inferior pubic ramus lytic lesion with soft tissue component. Measures on the order of 5.2 x 5.1 cm on 92/9. Compare 4.0 by 3.5 cm on the prior exam (when remeasured). IMPRESSION: 1. Redemonstration of bilateral pulmonary metastasis. The majority of nodules are similar in size. One index nodule has minimally decreased. 2. Progression of right inferior pubic ramus osseous metastasis. 3. No new sites of soft tissue metastasis identified. 4.  Cystoprostatectomy, as before. 5. Aortic atherosclerosis (ICD10-I70.0), coronary artery atherosclerosis and emphysema (ICD10-J43.9). Electronically Signed   By: Abigail Miyamoto M.D.   On: 03/31/2018 11:57   Ir Removal [employer] W/ [place] W/o Fl Mod Sed  Result Date: 04/14/2018 CLINICAL DATA:  Bacteremia, concern for infection EXAM: REMOVAL OF IMPLANTED TUNNELED PORT-A-CATH MEDICATIONS: Ancef 2 g. The antibiotic was administered within 1 hour prior to the start of the procedure. ANESTHESIA/SEDATION: Moderate (conscious) sedation was employed during this procedure. A total of Versed 1.0 mg and Fentanyl 50 mcg was administered intravenously. Moderate Sedation Time: 10 minutes. The patient's level of consciousness and vital signs were monitored continuously by radiology nursing throughout the procedure under my direct supervision. FLUOROSCOPY TIME:  None PROCEDURE: Informed written consent  was obtained from the patient after a discussion of the risk, benefits and alternatives to the procedure. The patient was positioned supine on the fluoroscopy table and the right chest Port-A-Cath site was prepped with chlorhexidine. A sterile gown and gloves were worn during the procedure. Local anesthesia was provided with 1% lidocaine with epinephrine. A timeout was performed prior to the initiation of the procedure. An incision was made overlying the Port-A-Cath with a #15 scalpel. Utilizing sharp and blunt dissection, the Port-A-Cath was removed completely. The pocked was irrigated with sterile saline. Wound closure was performed with subcutaneous 2-0 Vicryl, subcuticular 4-0 Vicryl, Dermabond and Steri-Strips. A dressing was placed. The patient tolerated the procedure well without immediate post procedural complication. FINDINGS: Successful removal of implant Port-A-Cath without immediate post procedural complication. IMPRESSION: Successful removal of implanted Port-A-Cath. Electronically Signed   By: Jerilynn Mages.  Shick M.D.   On:  04/14/2018 11:48    Orson Eva, DO  [facility] Pager [phone]  If 7PM-7AM, please contact night-coverage [email or web address] Password [email or web address] 04/16/2018, 3:27 PM   LOS: 5 days

## 2018-04-17 LAB — CBC WITH DIFFERENTIAL/PLATELET
Basophils Absolute: 0 10*3/uL (ref 0.0–0.1)
Basophils Relative: 0 %
Eosinophils Absolute: 0.1 10*3/uL (ref 0.0–0.7)
Eosinophils Relative: 4 %
HCT: 28.1 % — ABNORMAL LOW (ref 39.0–52.0)
Hemoglobin: 9.1 g/dL — ABNORMAL LOW (ref 13.0–17.0)
Lymphocytes Relative: 13 %
Lymphs Abs: 0.3 10*3/uL — ABNORMAL LOW (ref 0.7–4.0)
MCH: 30.2 pg (ref 26.0–34.0)
MCHC: 32.4 g/dL (ref 30.0–36.0)
MCV: 93.4 fL (ref 78.0–100.0)
Monocytes Absolute: 0.4 10*3/uL (ref 0.1–1.0)
Monocytes Relative: 14 %
Neutro Abs: 1.8 10*3/uL (ref 1.7–7.7)
Neutrophils Relative %: 69 %
Platelets: 19 10*3/uL — CL (ref 150–400)
RBC: 3.01 MIL/uL — ABNORMAL LOW (ref 4.22–5.81)
RDW: 18.1 % — ABNORMAL HIGH (ref 11.5–15.5)
WBC: 2.6 10*3/uL — ABNORMAL LOW (ref 4.0–10.5)

## 2018-04-17 MED ORDER — SODIUM CHLORIDE 0.9% IV SOLUTION: Freq: Once | INTRAVENOUS | Status: DC

## 2018-04-17 NOTE — Progress Notes (Signed)
Pharmacy Antibiotic Note  Today is day #7 of cefepime therapy for this 77 yo male admitted for neutropenic fever. CXR shows no infiltrates and port-a-cath was removed on 8/30.  Urine culture grew E. Coli and one of two blood cultures from 8/26 grew rothia mucilaginosa. Blood cultures from 8-29 have shown no growth. Patient's WBC is still low, but trending up slowly and he is afebrile currently.   Plan: Continue cefepime 2g IV q12h Monitor labs, c/s, and patient improvement  Height: 5\' 11"  (180.3 cm)(Carried from last encounter) Weight: 194 lb (88 kg)(bedscale) IBW/kg (Calculated) : 75.3 Temp (24hrs), Avg:98.4 F (36.9 C), Min:98.2 F (36.8 C), Max:98.6 F (37 C)  Recent Labs  Lab 04/10/18 2205 04/10/18 2213 04/10/18 2351 04/11/18 0458 04/12/18 0518 04/13/18 0537 04/14/18 1527 04/15/18 0710 04/16/18 1427 04/17/18 0627  WBC 4.7  --   --  3.6* 2.1* 1.2* 7.3 6.1 3.8* 2.6*  CREATININE 0.80  --   --  0.69 0.62 0.61  --  0.60*  --   --   LATICACIDVEN  --  0.94 0.73  --   --   --   --   --   --   --     Estimated Creatinine Clearance: 82.4 mL/min (A) (by C-G formula based on SCr of 0.6 mg/dL (L)).    Allergies  Allergen Reactions  . Ciprofloxacin Hives    Antimicrobials this admission: 8/27 cefepime >>   Microbiology results 8-29 BCx2: NG x3 days 8-26 BCx2:  streptococcus 1/2 BCID- did not grow in culture. Culture grew rothia mucilaginosa 8/26 UCx:  >100,000 e. coli  Thank you for allowing pharmacy to participate in the care of this patient.   Despina Pole, Pharm. D. Clinical Pharmacist 04/17/2018 10:20 AM

## 2018-04-17 NOTE — Progress Notes (Signed)
PROGRESS NOTE  [name] ZDG:[id number] DOB: 1941-04-29 DOA: 04/10/2018 PCP: [name], MD  Brief History: 77 year old male with a history of stage IV bladder cancer/urothelial carcinoma with pulmonary and pelvic metastasis, GERD presents with a fever of 1 2.7 F that developed around 7 PM on 04/10/2018. The patient last received chemotherapy on the morning of 04/10/2018. He underwent a cystoscopy prostatectomy on 06/19/2017 with ileal conduit. Currently onCarboplatin and gemcitabine cycle 4managed by Dr. Alen Blew.He did not receive day 8 of the last cycle of chemotherapy because of pancytopenia.Upon presentation, the patient was noted to have WBC 4.7 with low-grade temperature 99.7 F and tachycardia. UA showed 21-50 WBC. Lactic acid was 0.73. The patient was started empirically on cefepime with clinical improvment.  Assessment/Plan:  Febrile Neutropenia/SIRS -Last chemotherapy 04/10/2018 -source is bacteremia -Continue cefepime -Source is Rothia bacteremia -Urinalysis with pyuria, but sample comes from ileal conduit -now afebrile>96hours -personally reviewed CXR--no infiltrates -received one dose granix  Bacteremia--Rothia -source likely port-a-cathvs endocarditiis -Continue cefepime -04/13/18 repeat blood culture = neg -port-a-cath removed8/30 -04/14/18 TTE--EF--55%, TV appears nodular with echodensity, trivial TR -needs TEE on 04/17/18 or 04/18/18  EColi Bacteruria -significance is unclear presently as sample is obtained from ileal conduit  Metastatic urothelial carcinoma -Managed by Dr. Alen Blew -Currently onCarboplatin and gemcitabine cycle 4  Pancytopenia -related to recent chemo -transfused2 units PRBC 8/30 -am CBC with diff -without signs of active bleeding, hold off transfusing platelets -SCDs  Cancer associated pain -Continue home dose of hydromorphone  Fixed drug reaction -?related to chemo agents -sarna prn -benadryl prn  itch    Disposition Plan: Home 9/3 if TEE is negative Family Communication:Spouse updatedat bedside 04/17/18 Consultants:IR  Code Status: FULL  DVT Prophylaxis: SCDs  Cefepime 8/26>>>   Subjective: Patient denies fevers, chills, headache, chest pain, dyspnea, nausea, vomiting, diarrhea, abdominal pain, dysuria, hematuria, hematochezia, and melena.   Objective: Vitals:   04/16/18 0532 04/16/18 1337 04/16/18 2125 04/17/18 0522  BP: 135/76 116/89 129/68 132/66  Pulse: 82 87 93 80  Resp: 18 18 18 18   Temp: 98 F (36.7 C) 98.6 F (37 C) 98.5 F (36.9 C) 98.2 F (36.8 C)  TempSrc: Oral Oral Oral Oral  SpO2: 99% 100% 100% 100%  Weight:      Height:        Intake/Output Summary (Last 24 hours) at 04/17/2018 1613 Last data filed at 04/17/2018 0500 Gross per 24 hour  Intake 240 ml  Output 650 ml  Net -410 ml   Weight change:  Exam:   General:  Pt is alert, follows commands appropriately, not in acute distress  HEENT: No icterus, No thrush, No neck mass, [place]/AT  Cardiovascular: RRR, S1/S2, no rubs, no gallops  Respiratory: CTA bilaterally, no wheezing, no crackles, no rhonchi  Abdomen: Soft/+BS, non tender, non distended, no guarding  Extremities: No edema, No lymphangitis, No petechiae, No rashes, no synovitis   Data Reviewed: I have personally reviewed following labs and imaging studies Basic Metabolic Panel: Recent Labs  Lab 04/10/18 2205 04/11/18 0458 04/12/18 0518 04/13/18 0537 04/15/18 0710  NA 134* 136 138 140 140  K 3.6 3.5 3.7 3.8 3.6  CL 100 104 104 106 106  CO2 25 25 27 26 28   GLUCOSE 118* 131* 119* 94 94  BUN 17 14 11 11 14   CREATININE 0.80 0.69 0.62 0.61 0.60*  CALCIUM 8.7* 8.1* 8.1* 8.3* 8.3*   Liver Function Tests: Recent Labs  Lab 04/10/18 2205  04/11/18 0458  AST 26 26  ALT 34 29  ALKPHOS 50 44  BILITOT 1.0 0.5  PROT 5.8* 4.9*  ALBUMIN 2.8* 2.3*   No results for input(s): LIPASE, AMYLASE in the last 168  hours. No results for input(s): AMMONIA in the last 168 hours. Coagulation Profile: Recent Labs  Lab 04/13/18 1545  INR 0.96   CBC: Recent Labs  Lab 04/10/18 2205  04/13/18 0537 04/14/18 1527 04/15/18 0710 04/16/18 1427 04/17/18 0627  WBC 4.7   < > 1.2* 7.3 6.1 3.8* 2.6*  NEUTROABS 3.8  --  0.9* 7.0 5.6  --  1.8  HGB 9.2*   < > 7.4* 6.7* 8.5* 9.1* 9.1*  HCT 28.6*   < > 23.3* 21.0* 26.3* 28.1* 28.1*  MCV 99.0   < > 100.4* 100.0 94.3 93.4 93.4  PLT 90*   < > 43* 32* 24* 21* 19*   < > = values in this interval not displayed.   Cardiac Enzymes: No results for input(s): CKTOTAL, CKMB, CKMBINDEX, TROPONINI in the last 168 hours. BNP: Invalid input(s): POCBNP CBG: No results for input(s): GLUCAP in the last 168 hours. HbA1C: No results for input(s): HGBA1C in the last 72 hours. Urine analysis:    Component Value Date/Time   COLORURINE YELLOW 04/10/2018 2217   APPEARANCEUR HAZY (A) 04/10/2018 2217   LABSPEC 1.010 04/10/2018 2217   PHURINE 6.0 04/10/2018 2217   GLUCOSEU NEGATIVE 04/10/2018 2217   HGBUR NEGATIVE 04/10/2018 2217   BILIRUBINUR NEGATIVE 04/10/2018 2217   KETONESUR NEGATIVE 04/10/2018 2217   PROTEINUR NEGATIVE 04/10/2018 2217   NITRITE POSITIVE (A) 04/10/2018 2217   LEUKOCYTESUR LARGE (A) 04/10/2018 2217   Sepsis Labs: @LABRCNTIP (procalcitonin:4,lacticidven:4) ) Recent Results (from the past 240 hour(s))  Urine Culture     Status: Abnormal   Collection Time: 04/10/18 10:17 PM  Result Value Ref Range Status   Specimen Description   Final    URINE, RANDOM Performed at [facility], [address] [place], [place] [address]    Special Requests   Final    NONE Performed at [facility], [address], [place], [place] [address]    Culture >=100,000 COLONIES/mL ESCHERICHIA COLI (A)  Final   Report Status 04/13/2018 FINAL  Final   Organism ID, Bacteria ESCHERICHIA COLI (A)  Final      Susceptibility   Escherichia coli - MIC*    AMPICILLIN 16  INTERMEDIATE Intermediate     CEFAZOLIN <=4 SENSITIVE Sensitive     CEFTRIAXONE <=1 SENSITIVE Sensitive     CIPROFLOXACIN >=4 RESISTANT Resistant     GENTAMICIN <=1 SENSITIVE Sensitive     IMIPENEM <=0.25 SENSITIVE Sensitive     NITROFURANTOIN <=16 SENSITIVE Sensitive     TRIMETH/SULFA <=20 SENSITIVE Sensitive     AMPICILLIN/SULBACTAM 4 SENSITIVE Sensitive     PIP/TAZO <=4 SENSITIVE Sensitive     Extended ESBL NEGATIVE Sensitive     * >=100,000 COLONIES/mL ESCHERICHIA COLI  Blood culture (routine x 2)     Status: Abnormal   Collection Time: 04/10/18 11:06 PM  Result Value Ref Range Status   Specimen Description   Final    LEFT ANTECUBITAL Performed at [facility], [address], [place], [place] [address]    Special Requests   Final    BOTTLES DRAWN AEROBIC AND ANAEROBIC Blood Culture adequate volume Performed at [facility], [address], [place], [place] [address]    Culture  Setup Time   Final    GRAM POSITIVE COCCI ANAEROBIC  BOTTLE Gram Stain Report Called to,Read Back By and Verified With: ALSTON C. AT [id number] ON [id number] BY THOMPSON S. CRITICAL RESULT CALLED TO, READ BACK BY AND VERIFIED WITH: C ALSTON RN 04/11/18 1817 JDW    Culture (A)  Final    ROTHIA MUCILAGINOSA Standardized susceptibility testing for this organism is not available. STREPTOCOCCUS SPECIES IN BCID DID NOT GROW IN CULTURE Performed at [facility] [address], [place], [place] [address]    Report Status 04/13/2018 FINAL  Final  Blood Culture ID Panel (Reflexed)     Status: Abnormal   Collection Time: 04/10/18 11:06 PM  Result Value Ref Range Status   Enterococcus species NOT DETECTED NOT DETECTED Final   Listeria monocytogenes NOT DETECTED NOT DETECTED Final   Staphylococcus species NOT DETECTED NOT DETECTED Final   Staphylococcus aureus NOT DETECTED NOT DETECTED Final   Streptococcus species DETECTED (A) NOT DETECTED Final    Comment: Not Enterococcus species, Streptococcus agalactiae,  Streptococcus pyogenes, or Streptococcus pneumoniae. CRITICAL RESULT CALLED TO, READ BACK BY AND VERIFIED WITH: C ALSTON RN 04/11/18 1817 JDW    Streptococcus agalactiae NOT DETECTED NOT DETECTED Final   Streptococcus pneumoniae NOT DETECTED NOT DETECTED Final   Streptococcus pyogenes NOT DETECTED NOT DETECTED Final   Acinetobacter baumannii NOT DETECTED NOT DETECTED Final   Enterobacteriaceae species NOT DETECTED NOT DETECTED Final   Enterobacter cloacae complex NOT DETECTED NOT DETECTED Final   Escherichia coli NOT DETECTED NOT DETECTED Final   Klebsiella oxytoca NOT DETECTED NOT DETECTED Final   Klebsiella pneumoniae NOT DETECTED NOT DETECTED Final   Proteus species NOT DETECTED NOT DETECTED Final   Serratia marcescens NOT DETECTED NOT DETECTED Final   Haemophilus influenzae NOT DETECTED NOT DETECTED Final   Neisseria meningitidis NOT DETECTED NOT DETECTED Final   Pseudomonas aeruginosa NOT DETECTED NOT DETECTED Final   Candida albicans NOT DETECTED NOT DETECTED Final   Candida glabrata NOT DETECTED NOT DETECTED Final   Candida krusei NOT DETECTED NOT DETECTED Final   Candida parapsilosis NOT DETECTED NOT DETECTED Final   Candida tropicalis NOT DETECTED NOT DETECTED Final    Comment: Performed at [facility], [place] [address] [place], [place] [address]  Blood culture (routine x 2)     Status: None   Collection Time: 04/10/18 11:40 PM  Result Value Ref Range Status   Specimen Description BLOOD LEFT HAND  Final   Special Requests   Final    BOTTLES DRAWN AEROBIC ONLY Blood Culture adequate volume   Culture   Final    NO GROWTH 5 DAYS Performed at [facility], [address], [place], [place] [address]    Report Status 04/15/2018 FINAL  Final  Culture, blood (Routine X 2) w Reflex to ID Panel     Status: None (Preliminary result)   Collection Time: 04/13/18  1:58 PM  Result Value Ref Range Status   Specimen Description LEFT ANTECUBITAL  Final   Special Requests   Final     BOTTLES DRAWN AEROBIC AND ANAEROBIC Blood Culture adequate volume   Culture   Final    NO GROWTH 4 DAYS Performed at [facility], [address], [place], [place] [address]    Report Status PENDING  Incomplete  Culture, blood (Routine X 2) w Reflex to ID Panel     Status: None (Preliminary result)   Collection Time: 04/13/18  2:03 PM  Result Value Ref Range Status   Specimen Description BLOOD CENTRAL LINE  Final  Special Requests   Final    BOTTLES DRAWN AEROBIC AND ANAEROBIC Blood Culture adequate volume   Culture   Final    NO GROWTH 4 DAYS Performed at [facility], [address], [place], [place] [address]    Report Status PENDING  Incomplete     Scheduled Meds: . sodium chloride   Intravenous Once  . feeding supplement (ENSURE ENLIVE)  237 mL Oral BID BM  . polyethylene glycol  17 g Oral BID  . senna  2 tablet Oral Daily   Continuous Infusions: . ceFEPime (MAXIPIME) IV Stopped (04/17/18 1111)    Procedures/Studies: Dg Chest 2 View  Result Date: 04/10/2018 CLINICAL DATA:  Fever. Chemotherapy today. History of metastatic bladder cancer. EXAM: CHEST - 2 VIEW COMPARISON:  CT chest March 31, 2018 in chest radiograph January 25, 2018 FINDINGS: Heart size is normal. Mediastinal silhouette is not suspicious. Known pulmonary nodules less conspicuous due to plain radiography. No pleural effusion or focal consolidation. No pneumothorax. Biapical pleural thickening. Single-lumen RIGHT chest Port-A-Cath with distal tip projecting mid superior vena cava. No pneumothorax. Soft tissue planes and included osseous structures are non suspicious. IMPRESSION: 1. No acute cardiopulmonary process. Electronically Signed   By: Elon Alas M.D.   On: 04/10/2018 22:01   Ct Chest W Contrast  Result Date: 03/31/2018 CLINICAL DATA:  Bladder cancer with cystectomy in 2016. Right hip metastasis. EXAM: CT CHEST, ABDOMEN, AND PELVIS WITH CONTRAST TECHNIQUE: Multidetector CT imaging of the chest, abdomen  and pelvis was performed following the standard protocol during bolus administration of intravenous contrast. CONTRAST:  121mL OMNIPAQUE IOHEXOL 300 MG/ML  SOLN COMPARISON:  Chest CT 01/03/2018.  Abdominopelvic CT 11/25/2017. FINDINGS: CT CHEST FINDINGS Cardiovascular: A right Port-A-Cath which terminates at the low SVC. Aortic and branch vessel atherosclerosis. Normal heart size, without pericardial effusion. Multivessel coronary artery atherosclerosis. Mediastinum/Nodes: No supraclavicular adenopathy. No mediastinal or hilar adenopathy. Tiny hiatal hernia. Lungs/Pleura: No pleural fluid. Focal right-sided pleural thickening medially is similar. Moderate centrilobular emphysema with lower lobe predominant bronchial wall thickening. Bilateral pulmonary nodules. Index left upper lobe pulmonary nodule measures 12 x 12 mm on 41/6 and is unchanged. Index anterior right upper lobe pulmonary nodule measures 8 x 5 mm on 63/6 and is slightly decreased from 9 x 8 mm on the prior. The remainder of the smaller pulmonary nodules are similar. Musculoskeletal: No acute osseous abnormality. CT ABDOMEN PELVIS FINDINGS Hepatobiliary: Bilateral hepatic cysts. Normal gallbladder, without biliary ductal dilatation. Pancreas: Pancreatic parenchymal versus vascular calcifications adjacent the common duct within the head. No duct dilatation or acute inflammation. Spleen: Normal in size, without focal abnormality. Adrenals/Urinary Tract: Normal adrenal glands. Favor early contrast excretion in the left kidney, given absence of stones on prior noncontrast exam. Too small to characterize lesions in both kidneys. Good renal collecting system opacification on delayed images. Suboptimal proximal to mid right ureteric opacification on delayed images. No ureteric filling defect identified. Cystectomy and ileal conduit creation, without acute complication. Stomach/Bowel: Normal remainder of the stomach. Otherwise normal large and small bowel.  Vascular/Lymphatic: Aortic and branch vessel atherosclerosis. Multiple renal arteries bilaterally. No abdominopelvic adenopathy. Reproductive: Prostatectomy. Other: No significant free fluid. Musculoskeletal: Right inferior pubic ramus lytic lesion with soft tissue component. Measures on the order of 5.2 x 5.1 cm on 92/9. Compare 4.0 by 3.5 cm on the prior exam (when remeasured). IMPRESSION: 1. Redemonstration of bilateral pulmonary metastasis. The majority of nodules are similar in size. One index nodule has minimally decreased. 2. Progression of right  inferior pubic ramus osseous metastasis. 3. No new sites of soft tissue metastasis identified. 4. Cystoprostatectomy, as before. 5. Aortic atherosclerosis (ICD10-I70.0), coronary artery atherosclerosis and emphysema (ICD10-J43.9). Electronically Signed   By: Abigail Miyamoto M.D.   On: 03/31/2018 11:57   Ct Abdomen Pelvis W Contrast  Result Date: 03/31/2018 CLINICAL DATA:  Bladder cancer with cystectomy in 2016. Right hip metastasis. EXAM: CT CHEST, ABDOMEN, AND PELVIS WITH CONTRAST TECHNIQUE: Multidetector CT imaging of the chest, abdomen and pelvis was performed following the standard protocol during bolus administration of intravenous contrast. CONTRAST:  177mL OMNIPAQUE IOHEXOL 300 MG/ML  SOLN COMPARISON:  Chest CT 01/03/2018.  Abdominopelvic CT 11/25/2017. FINDINGS: CT CHEST FINDINGS Cardiovascular: A right Port-A-Cath which terminates at the low SVC. Aortic and branch vessel atherosclerosis. Normal heart size, without pericardial effusion. Multivessel coronary artery atherosclerosis. Mediastinum/Nodes: No supraclavicular adenopathy. No mediastinal or hilar adenopathy. Tiny hiatal hernia. Lungs/Pleura: No pleural fluid. Focal right-sided pleural thickening medially is similar. Moderate centrilobular emphysema with lower lobe predominant bronchial wall thickening. Bilateral pulmonary nodules. Index left upper lobe pulmonary nodule measures 12 x 12 mm on 41/6 and  is unchanged. Index anterior right upper lobe pulmonary nodule measures 8 x 5 mm on 63/6 and is slightly decreased from 9 x 8 mm on the prior. The remainder of the smaller pulmonary nodules are similar. Musculoskeletal: No acute osseous abnormality. CT ABDOMEN PELVIS FINDINGS Hepatobiliary: Bilateral hepatic cysts. Normal gallbladder, without biliary ductal dilatation. Pancreas: Pancreatic parenchymal versus vascular calcifications adjacent the common duct within the head. No duct dilatation or acute inflammation. Spleen: Normal in size, without focal abnormality. Adrenals/Urinary Tract: Normal adrenal glands. Favor early contrast excretion in the left kidney, given absence of stones on prior noncontrast exam. Too small to characterize lesions in both kidneys. Good renal collecting system opacification on delayed images. Suboptimal proximal to mid right ureteric opacification on delayed images. No ureteric filling defect identified. Cystectomy and ileal conduit creation, without acute complication. Stomach/Bowel: Normal remainder of the stomach. Otherwise normal large and small bowel. Vascular/Lymphatic: Aortic and branch vessel atherosclerosis. Multiple renal arteries bilaterally. No abdominopelvic adenopathy. Reproductive: Prostatectomy. Other: No significant free fluid. Musculoskeletal: Right inferior pubic ramus lytic lesion with soft tissue component. Measures on the order of 5.2 x 5.1 cm on 92/9. Compare 4.0 by 3.5 cm on the prior exam (when remeasured). IMPRESSION: 1. Redemonstration of bilateral pulmonary metastasis. The majority of nodules are similar in size. One index nodule has minimally decreased. 2. Progression of right inferior pubic ramus osseous metastasis. 3. No new sites of soft tissue metastasis identified. 4. Cystoprostatectomy, as before. 5. Aortic atherosclerosis (ICD10-I70.0), coronary artery atherosclerosis and emphysema (ICD10-J43.9). Electronically Signed   By: Abigail Miyamoto M.D.   On:  03/31/2018 11:57   Ir Removal [employer] W/ [place] W/o Fl Mod Sed  Result Date: 04/14/2018 CLINICAL DATA:  Bacteremia, concern for infection EXAM: REMOVAL OF IMPLANTED TUNNELED PORT-A-CATH MEDICATIONS: Ancef 2 g. The antibiotic was administered within 1 hour prior to the start of the procedure. ANESTHESIA/SEDATION: Moderate (conscious) sedation was employed during this procedure. A total of Versed 1.0 mg and Fentanyl 50 mcg was administered intravenously. Moderate Sedation Time: 10 minutes. The patient's level of consciousness and vital signs were monitored continuously by radiology nursing throughout the procedure under my direct supervision. FLUOROSCOPY TIME:  None PROCEDURE: Informed written consent was obtained from the patient after a discussion of the risk, benefits and alternatives to the procedure. The patient was positioned supine on the fluoroscopy table and the right  chest Port-A-Cath site was prepped with chlorhexidine. A sterile gown and gloves were worn during the procedure. Local anesthesia was provided with 1% lidocaine with epinephrine. A timeout was performed prior to the initiation of the procedure. An incision was made overlying the Port-A-Cath with a #15 scalpel. Utilizing sharp and blunt dissection, the Port-A-Cath was removed completely. The pocked was irrigated with sterile saline. Wound closure was performed with subcutaneous 2-0 Vicryl, subcuticular 4-0 Vicryl, Dermabond and Steri-Strips. A dressing was placed. The patient tolerated the procedure well without immediate post procedural complication. FINDINGS: Successful removal of implant Port-A-Cath without immediate post procedural complication. IMPRESSION: Successful removal of implanted Port-A-Cath. Electronically Signed   By: Jerilynn Mages.  Shick M.D.   On: 04/14/2018 11:48    Orson Eva, DO  [facility] Pager [phone]  If 7PM-7AM, please contact night-coverage [email or web address] Password [email or web address] 04/17/2018, 4:13 PM   LOS: 6 days

## 2018-04-17 NOTE — Progress Notes (Signed)
CRITICAL VALUE ALERT  Critical Value:  Platelets 19  Date & Time Notied:  04/17/2018 0900  Provider Notified: Dr. Carles Collet  Orders Received/Actions taken: awaiting further instructions

## 2018-04-18 LAB — CULTURE, BLOOD (ROUTINE X 2)
Culture: NO GROWTH
Culture: NO GROWTH
Special Requests: ADEQUATE
Special Requests: ADEQUATE

## 2018-04-18 LAB — BPAM PLATELET PHERESIS
Blood Product Expiration Date: 201909042359
ISSUE DATE / TIME: 201909022013
Unit Type and Rh: 600

## 2018-04-18 LAB — PREPARE PLATELET PHERESIS: Unit division: 0

## 2018-04-18 LAB — BASIC METABOLIC PANEL
Anion gap: 5 (ref 5–15)
BUN: 9 mg/dL (ref 8–23)
CO2: 28 mmol/L (ref 22–32)
Calcium: 8.6 mg/dL — ABNORMAL LOW (ref 8.9–10.3)
Chloride: 110 mmol/L (ref 98–111)
Creatinine, Ser: 0.6 mg/dL — ABNORMAL LOW (ref 0.61–1.24)
GFR calc Af Amer: >60 mL/min (ref >60)
GFR calc non Af Amer: >60 mL/min (ref >60)
Glucose, Bld: 101 mg/dL — ABNORMAL HIGH (ref 70–99)
Potassium: 3.5 mmol/L (ref 3.5–5.1)
Sodium: 143 mmol/L (ref 135–145)

## 2018-04-18 LAB — CBC
HCT: 27.5 % — ABNORMAL LOW (ref 39.0–52.0)
Hemoglobin: 8.8 g/dL — ABNORMAL LOW (ref 13.0–17.0)
MCH: 30.1 pg (ref 26.0–34.0)
MCHC: 32 g/dL (ref 30.0–36.0)
MCV: 94.2 fL (ref 78.0–100.0)
Platelets: 40 10*3/uL — ABNORMAL LOW (ref 150–400)
RBC: 2.92 MIL/uL — ABNORMAL LOW (ref 4.22–5.81)
RDW: 16.7 % — ABNORMAL HIGH (ref 11.5–15.5)
WBC: 2.8 10*3/uL — ABNORMAL LOW (ref 4.0–10.5)

## 2018-04-18 LAB — MAGNESIUM: Magnesium: 2 mg/dL (ref 1.7–2.4)

## 2018-04-18 MED ORDER — SODIUM CHLORIDE 0.9% FLUSH: 10.0000 mL | INTRAVENOUS | Status: DC | PRN

## 2018-04-18 MED ORDER — SODIUM CHLORIDE 0.9 % IV SOLN: 2.0000 g | Freq: Two times a day (BID) | INTRAVENOUS | 0 refills | Status: DC

## 2018-04-18 MED ORDER — SODIUM CHLORIDE 0.9% FLUSH: 10.0000 mL | Freq: Two times a day (BID) | INTRAVENOUS | Status: DC

## 2018-04-18 MED ORDER — POLYETHYLENE GLYCOL 3350 17 G PO PACK: 17.0000 g | PACK | Freq: Every day | ORAL | 0 refills | Status: DC

## 2018-04-18 MED ORDER — ENSURE ENLIVE PO LIQD: 237.0000 mL | Freq: Two times a day (BID) | ORAL | 12 refills | Status: DC

## 2018-04-18 NOTE — Progress Notes (Addendum)
   Chart reviewed as Cardiology was consulted for consideration of a TEE. The patient has Stage 4 Bladder/Urothelial Carcinoma with metastatic disease and is currently admitted for febrile neutropenia/SIRS having been found to have Rothia bacteremia. Transthoracic echocardiogram on 04/14/2018 showed the tricuspid valve appeared somewhat nodular with associated echodensity which could be due to artifact but vegetation could not be excluded and a TEE could be considered for further evaluation.  In reviewing recent labs, the patient's platelet count was 19K on 04/17/2018 and he received a platelet transfusion yesterday evening with repeat labs pending for this morning. Will review with Dr. Domenic Polite but given his severe thrombocytopenia, he would not be an ideal candidate for a TEE at this time.   Signed, Erma Heritage, PA-C 04/18/2018, 7:54 AM Pager: [phone]   Attending note:  Chart reviewed.  Echocardiogram from last week already reviewed.  Patient has stage IV metastatic bladder cancer/urethral carcinoma with metastasis affecting pelvic sites and also the lungs.  He recently received chemotherapy treatment on August 26 (carboplatin and gemcitabine).  He was subsequently admitted with low-grade fevers  Rothia Mucilaginosa identified from blood culture on August 26, also Escherichia Coli identified from urine culture on August 26.  Right-sided Port-A-Cath was felt to be possible site and this was removed.  Follow-up blood cultures from August 29 have been negative.  He is currently afebrile, however pancytopenic following chemotherapy with recent platelet count 19,000.  Patient would be at higher risk for complication with TEE in light of his current medical condition.  Risk of bleeding would be high, and I would also have concerns about respiratory complications in light of known pulmonary metastasis.  If the option for a longer term treatment course of antibiotics is within reason (4 weeks to cover  potential endocarditis), this would obviate the need for a high risk TEE at this particular time.  Follow-up blood cultures and possibly reimaging at the end of treatment course could be considered, and perhaps he would be at a point where he is not actively pancytopenic and therefore have somewhat lower risk.  The other issue to consider is that even if he does have a definite tricuspid valve vegetation or abscess, he would not be a surgical candidate in light of his current condition, and the treatment would be antibiotics.  If it is felt by the primary team that a TEE is absolutely required at this time, I think the best thing for the patient would be to have him transferred to [facility].  If there are complications, he would be better served at a tertiary care facility.  Satira Sark, M.D., F.A.C.C.

## 2018-04-18 NOTE — Progress Notes (Signed)
Peripherally Inserted Central Catheter/Midline Placement  The IV Nurse has discussed with the patient and/or persons authorized to consent for the patient, the purpose of this procedure and the potential benefits and risks involved with this procedure.  The benefits include less needle sticks, lab draws from the catheter, and the patient may be discharged home with the catheter. Risks include, but not limited to, infection, bleeding, blood clot (thrombus formation), and puncture of an artery; nerve damage and irregular heartbeat and possibility to perform a PICC exchange if needed/ordered by physician.  Alternatives to this procedure were also discussed.  Bard Power PICC patient education guide, fact sheet on infection prevention and patient information card has been provided to patient /or left at bedside.    PICC/Midline Placement Documentation  PICC Single Lumen 03/17/22 PICC Left Basilic 46 cm 0 cm (Active)  Indication for Insertion or Continuance of Line Home intravenous therapies (PICC only) 04/18/2018  1:00 PM  Exposed Catheter (cm) 0 cm 04/18/2018  1:00 PM  Site Assessment Clean;Dry;Intact 04/18/2018  1:00 PM  Line Status Flushed;Blood return noted 04/18/2018  1:00 PM  Dressing Type Transparent 04/18/2018  1:00 PM  Dressing Status Clean;Dry;Intact;Antimicrobial disc in place 04/18/2018  1:00 PM  Dressing Intervention New dressing 04/18/2018  1:00 PM  Dressing Change Due 04/25/18 04/18/2018  1:00 PM       Jule Economy Horton 04/18/2018, 1:40 PM

## 2018-04-18 NOTE — Care Management Important Message (Signed)
Important Message  Patient Details  Name: [name] MRN: [id number] Date of Birth: November 16, 1940   Medicare Important Message Given:  Yes    [name] 04/18/2018, 9:33 AM

## 2018-04-18 NOTE — Progress Notes (Addendum)
Patient's PICC line flushed site WNL.  AVS reviewed with patient and wife.  Both verbalized understanding of discharge instructions, physician follow-up, medications, PICC line education.  [facility] arranged to follow-up tomorrow.  Patient stable awaiting transport by NT for discharge.  Reports belongings intact and in possession.

## 2018-04-18 NOTE — Progress Notes (Signed)
Platelet infusion completed without difficulty and VSS.

## 2018-04-18 NOTE — Care Management Note (Signed)
Case Management Note  Patient Details  Name: [name] MRN: [id number] Date of Birth: [birth year]  Expected Discharge Date:  04/18/18               Expected Discharge Plan:  [place]  In-House Referral:  NA  Discharge planning Services  CM Consult  Post Acute Care Choice:  Home Health Choice offered to:  Patient, Spouse   HH Arranged:  RN, IV Antibiotics HH Agency:  [facility]  Status of Service:  Completed, signed off  If discussed at [place] of Stay Meetings, dates discussed:  04/18/2018  Additional Comments:  Sherald Barge, RN 04/18/2018, 12:17 PM

## 2018-04-18 NOTE — Care Management (Signed)
DC home today with IV abx through [facility]. [facility] rep to talk to pt and wife today. IV abx will start at home this afternoon.

## 2018-04-18 NOTE — Discharge Summary (Addendum)
Physician Discharge Summary  [name]:[id number] DOB: 09-16-1940 DOA: 04/10/2018  PCP: [name], MD  Admit date: 04/10/2018 Discharge date: 04/18/2018  Admitted From: home Disposition:  Home   Recommendations for Outpatient Follow-up:  1. Follow up with PCP in 1-2 weeks 2. Please obtain weekly CBC and BMP while on IV antibiotics starting on 619. 3. Continue cefepime with a stop date of 05/25/2018 4. 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 The patient last received chemotherapy on the morning of 04/10/2018. He underwent a cystoscopy prostatectomy on 06/19/2017 with ileal conduit. Currently onCarboplatin and gemcitabine cycle 62managed by Dr. Alen Blew.He did not receive day 8 of the last cycle of chemotherapy because of pancytopenia.Upon presentation, the patient was noted to have WBC 4.7 with low-grade temperature 99.7 F and tachycardia. UA showed 21-50 WBC. Lactic acid was 0.73. The patient was started empirically on cefepime with clinical improvment. The patient was noted to have Rothia bacteremia.  His Port-A-Cath was removed.  Repeat blood cultures were negative.  Transthoracic echocardiogram suggested abnormality in the tricuspid valve.  Cardiology was consulted for possible TEE.  However, after discussion with cardiology, it was felt that the risk was greater than benefit of TEE given the patient's clinical condition and thromboytopenia.  Therefore, the decision was made to treat the patient with a prolonged course of antibiotics.  Discharge Diagnoses:  Febrile  Neutropenia/SIRS-->sepsis due to bactermia -Last chemotherapy 04/10/2018 -source is bacteremia -Continue cefepime -Source is Rothia bacteremia -Urinalysis with pyuria, but sample comes from ileal conduit -now afebrile>96hours -personally reviewed CXR--no infiltrates -received one dose granix  Bacteremia--Rothia -source likely port-a-cathvs endocarditiis -Continue cefepime -04/13/18 repeat blood culture = neg -port-a-cath removed8/30 -04/14/18 TTE--EF--55%, TV appears nodular with echodensity, trivial TR -Case was discussed with cardiology.  It was felt that the risk is greater than benefit of TEE in the setting of his clinical condition and thrombus cytopenia.  Furthermore, the patient is not a surgical candidate should have endocarditis.  Therefore, the decision was made to treat the patient with a prolonged course of antibiotics.  A PICC line was placed and the patient will be sent home with IV cefepime with a stop date of May 25, 2018. -Dr. Alen Blew updated  [facility] Bacteruria -significance is unclear presently as sample is obtained from ileal conduit  Metastatic urothelial carcinoma -Managed by Dr. Alen Blew -Currently onCarboplatin and gemcitabine cycle 4  Pancytopenia -related to recent chemo -transfused2 units PRBC 8/30 -am CBC with diff -transfused 1 unit platelets 9/2 in preparation for TEE -SCDs  Cancer associated pain -Continue home dose of hydromorphone  Fixed drug reaction -?related to chemo agents -sarna prn -benadryl prn itch -improved    Discharge Instructions   Allergies as of 04/18/2018      Reactions   Ciprofloxacin Hives      Medication List    STOP taking these medications   lidocaine-prilocaine cream Commonly known as:  EMLA   predniSONE 10 MG (21) Tbpk tablet Commonly known as:  STERAPRED UNI-PAK 21 TAB     TAKE these medications   acetaminophen 500 MG tablet Commonly known as:  TYLENOL Take 1,000 mg by mouth every 6 (six)  hours  as needed.   ceFEPIme 2 g in sodium chloride 0.9 % 100 mL Inject 2 g into the vein every 12 (twelve) hours. Last dose on 05/25/2018   feeding supplement (ENSURE ENLIVE) Liqd Take 237 mLs by mouth 2 (two) times daily between meals.   HYDROmorphone 4 MG tablet Commonly known as:  DILAUDID Take 1 tablet (4 mg total) by mouth every 4 (four) hours as needed for severe pain.   hydrOXYzine 50 MG tablet Commonly known as:  ATARAX/VISTARIL Take 1 tablet (50 mg total) by mouth 4 (four) times daily as needed.   oxyCODONE 5 MG immediate release tablet Commonly known as:  Oxy IR/ROXICODONE Take 1-2 tablets (5-10 mg total) by mouth every 4 (four) hours as needed for severe pain.   polyethylene glycol packet Commonly known as:  MIRALAX / GLYCOLAX Take 17 g by mouth daily.   prochlorperazine 10 MG tablet Commonly known as:  COMPAZINE Take 1 tablet (10 mg total) by mouth every 6 (six) hours as needed for nausea or vomiting.       Allergies  Allergen Reactions  . Ciprofloxacin Hives    Consultations:  Cardiology  IR   Procedures/Studies: Dg Chest 2 View  Result Date: 04/10/2018 CLINICAL DATA:  Fever. Chemotherapy today. History of metastatic bladder cancer. EXAM: CHEST - 2 VIEW COMPARISON:  CT chest March 31, 2018 in chest radiograph January 25, 2018 FINDINGS: Heart size is normal. Mediastinal silhouette is not suspicious. Known pulmonary nodules less conspicuous due to plain radiography. No pleural effusion or focal consolidation. No pneumothorax. Biapical pleural thickening. Single-lumen RIGHT chest Port-A-Cath with distal tip projecting mid superior vena cava. No pneumothorax. Soft tissue planes and included osseous structures are non suspicious. IMPRESSION: 1. No acute cardiopulmonary process. Electronically Signed   By: Elon Alas M.D.   On: 04/10/2018 22:01   Ct Chest W Contrast  Result Date: 03/31/2018 CLINICAL DATA:  Bladder cancer with cystectomy in 2016. Right hip  metastasis. EXAM: CT CHEST, ABDOMEN, AND PELVIS WITH CONTRAST TECHNIQUE: Multidetector CT imaging of the chest, abdomen and pelvis was performed following the standard protocol during bolus administration of intravenous contrast. CONTRAST:  16mL OMNIPAQUE IOHEXOL 300 MG/ML  SOLN COMPARISON:  Chest CT 01/03/2018.  Abdominopelvic CT 11/25/2017. FINDINGS: CT CHEST FINDINGS Cardiovascular: A right Port-A-Cath which terminates at the low SVC. Aortic and branch vessel atherosclerosis. Normal heart size, without pericardial effusion. Multivessel coronary artery atherosclerosis. Mediastinum/Nodes: No supraclavicular adenopathy. No mediastinal or hilar adenopathy. Tiny hiatal hernia. Lungs/Pleura: No pleural fluid. Focal right-sided pleural thickening medially is similar. Moderate centrilobular emphysema with lower lobe predominant bronchial wall thickening. Bilateral pulmonary nodules. Index left upper lobe pulmonary nodule measures 12 x 12 mm on 41/6 and is unchanged. Index anterior right upper lobe pulmonary nodule measures 8 x 5 mm on 63/6 and is slightly decreased from 9 x 8 mm on the prior. The remainder of the smaller pulmonary nodules are similar. Musculoskeletal: No acute osseous abnormality. CT ABDOMEN PELVIS FINDINGS Hepatobiliary: Bilateral hepatic cysts. Normal gallbladder, without biliary ductal dilatation. Pancreas: Pancreatic parenchymal versus vascular calcifications adjacent the common duct within the head. No duct dilatation or acute inflammation. Spleen: Normal in size, without focal abnormality. Adrenals/Urinary Tract: Normal adrenal glands. Favor early contrast excretion in the left kidney, given absence of stones on prior noncontrast exam. Too small to characterize lesions in both kidneys. Good renal collecting system opacification on delayed images. Suboptimal proximal to mid right ureteric opacification on delayed images. No ureteric filling defect identified. Cystectomy and ileal  conduit creation,  without acute complication. Stomach/Bowel: Normal remainder of the stomach. Otherwise normal large and small bowel. Vascular/Lymphatic: Aortic and branch vessel atherosclerosis. Multiple renal arteries bilaterally. No abdominopelvic adenopathy. Reproductive: Prostatectomy. Other: No significant free fluid. Musculoskeletal: Right inferior pubic ramus lytic lesion with soft tissue component. Measures on the order of 5.2 x 5.1 cm on 92/9. Compare 4.0 by 3.5 cm on the prior exam (when remeasured). IMPRESSION: 1. Redemonstration of bilateral pulmonary metastasis. The majority of nodules are similar in size. One index nodule has minimally decreased. 2. Progression of right inferior pubic ramus osseous metastasis. 3. No new sites of soft tissue metastasis identified. 4. Cystoprostatectomy, as before. 5. Aortic atherosclerosis (ICD10-I70.0), coronary artery atherosclerosis and emphysema (ICD10-J43.9). Electronically Signed   By: Abigail Miyamoto M.D.   On: 03/31/2018 11:57   Ct Abdomen Pelvis W Contrast  Result Date: 03/31/2018 CLINICAL DATA:  Bladder cancer with cystectomy in 2016. Right hip metastasis. EXAM: CT CHEST, ABDOMEN, AND PELVIS WITH CONTRAST TECHNIQUE: Multidetector CT imaging of the chest, abdomen and pelvis was performed following the standard protocol during bolus administration of intravenous contrast. CONTRAST:  127mL OMNIPAQUE IOHEXOL 300 MG/ML  SOLN COMPARISON:  Chest CT 01/03/2018.  Abdominopelvic CT 11/25/2017. FINDINGS: CT CHEST FINDINGS Cardiovascular: A right Port-A-Cath which terminates at the low SVC. Aortic and branch vessel atherosclerosis. Normal heart size, without pericardial effusion. Multivessel coronary artery atherosclerosis. Mediastinum/Nodes: No supraclavicular adenopathy. No mediastinal or hilar adenopathy. Tiny hiatal hernia. Lungs/Pleura: No pleural fluid. Focal right-sided pleural thickening medially is similar. Moderate centrilobular emphysema with lower lobe predominant bronchial  wall thickening. Bilateral pulmonary nodules. Index left upper lobe pulmonary nodule measures 12 x 12 mm on 41/6 and is unchanged. Index anterior right upper lobe pulmonary nodule measures 8 x 5 mm on 63/6 and is slightly decreased from 9 x 8 mm on the prior. The remainder of the smaller pulmonary nodules are similar. Musculoskeletal: No acute osseous abnormality. CT ABDOMEN PELVIS FINDINGS Hepatobiliary: Bilateral hepatic cysts. Normal gallbladder, without biliary ductal dilatation. Pancreas: Pancreatic parenchymal versus vascular calcifications adjacent the common duct within the head. No duct dilatation or acute inflammation. Spleen: Normal in size, without focal abnormality. Adrenals/Urinary Tract: Normal adrenal glands. Favor early contrast excretion in the left kidney, given absence of stones on prior noncontrast exam. Too small to characterize lesions in both kidneys. Good renal collecting system opacification on delayed images. Suboptimal proximal to mid right ureteric opacification on delayed images. No ureteric filling defect identified. Cystectomy and ileal conduit creation, without acute complication. Stomach/Bowel: Normal remainder of the stomach. Otherwise normal large and small bowel. Vascular/Lymphatic: Aortic and branch vessel atherosclerosis. Multiple renal arteries bilaterally. No abdominopelvic adenopathy. Reproductive: Prostatectomy. Other: No significant free fluid. Musculoskeletal: Right inferior pubic ramus lytic lesion with soft tissue component. Measures on the order of 5.2 x 5.1 cm on 92/9. Compare 4.0 by 3.5 cm on the prior exam (when remeasured). IMPRESSION: 1. Redemonstration of bilateral pulmonary metastasis. The majority of nodules are similar in size. One index nodule has minimally decreased. 2. Progression of right inferior pubic ramus osseous metastasis. 3. No new sites of soft tissue metastasis identified. 4. Cystoprostatectomy, as before. 5. Aortic atherosclerosis (ICD10-I70.0),  coronary artery atherosclerosis and emphysema (ICD10-J43.9). Electronically Signed   By: Abigail Miyamoto M.D.   On: 03/31/2018 11:57   Ir Removal [employer] W/ [place] W/o Fl Mod Sed  Result Date: 04/14/2018 CLINICAL DATA:  Bacteremia, concern for infection EXAM: REMOVAL OF IMPLANTED TUNNELED PORT-A-CATH MEDICATIONS: Ancef 2 g. The antibiotic was  administered within 1 hour prior to the start of the procedure. ANESTHESIA/SEDATION: Moderate (conscious) sedation was employed during this procedure. A total of Versed 1.0 mg and Fentanyl 50 mcg was administered intravenously. Moderate Sedation Time: 10 minutes. The patient's level of consciousness and vital signs were monitored continuously by radiology nursing throughout the procedure under my direct supervision. FLUOROSCOPY TIME:  None PROCEDURE: Informed written consent was obtained from the patient after a discussion of the risk, benefits and alternatives to the procedure. The patient was positioned supine on the fluoroscopy table and the right chest Port-A-Cath site was prepped with chlorhexidine. A sterile gown and gloves were worn during the procedure. Local anesthesia was provided with 1% lidocaine with epinephrine. A timeout was performed prior to the initiation of the procedure. An incision was made overlying the Port-A-Cath with a #15 scalpel. Utilizing sharp and blunt dissection, the Port-A-Cath was removed completely. The pocked was irrigated with sterile saline. Wound closure was performed with subcutaneous 2-0 Vicryl, subcuticular 4-0 Vicryl, Dermabond and Steri-Strips. A dressing was placed. The patient tolerated the procedure well without immediate post procedural complication. FINDINGS: Successful removal of implant Port-A-Cath without immediate post procedural complication. IMPRESSION: Successful removal of implanted Port-A-Cath. Electronically Signed   By: Jerilynn Mages.  Shick M.D.   On: 04/14/2018 11:48        Discharge Exam: Vitals:   04/17/18 2218  04/18/18 0500  BP: 125/81 126/82  Pulse: 86 84  Resp: 18 18  Temp: 98.4 F (36.9 C) 98 F (36.7 C)  SpO2: 97% 98%   Vitals:   04/17/18 2058 04/17/18 2131 04/17/18 2218 04/18/18 0500  BP: 131/77 132/76 125/81 126/82  Pulse: 87 86 86 84  Resp: 18 18 18 18   Temp: 98.4 F (36.9 C) 98.2 F (36.8 C) 98.4 F (36.9 C) 98 F (36.7 C)  TempSrc: Oral Oral Oral Oral  SpO2: 100% 97% 97% 98%  Weight:      Height:        General: Pt is alert, awake, not in acute distress Cardiovascular: RRR, S1/S2 +, no rubs, no gallops Respiratory: CTA bilaterally, no wheezing, no rhonchi Abdominal: Soft, NT, ND, bowel sounds + Extremities: no edema, no cyanosis   The results of significant diagnostics from this hospitalization (including imaging, microbiology, ancillary and laboratory) are listed below for reference.    Significant Diagnostic Studies: Dg Chest 2 View  Result Date: 04/10/2018 CLINICAL DATA:  Fever. Chemotherapy today. History of metastatic bladder cancer. EXAM: CHEST - 2 VIEW COMPARISON:  CT chest March 31, 2018 in chest radiograph January 25, 2018 FINDINGS: Heart size is normal. Mediastinal silhouette is not suspicious. Known pulmonary nodules less conspicuous due to plain radiography. No pleural effusion or focal consolidation. No pneumothorax. Biapical pleural thickening. Single-lumen RIGHT chest Port-A-Cath with distal tip projecting mid superior vena cava. No pneumothorax. Soft tissue planes and included osseous structures are non suspicious. IMPRESSION: 1. No acute cardiopulmonary process. Electronically Signed   By: Elon Alas M.D.   On: 04/10/2018 22:01   Ct Chest W Contrast  Result Date: 03/31/2018 CLINICAL DATA:  Bladder cancer with cystectomy in 2016. Right hip metastasis. EXAM: CT CHEST, ABDOMEN, AND PELVIS WITH CONTRAST TECHNIQUE: Multidetector CT imaging of the chest, abdomen and pelvis was performed following the standard protocol during bolus administration of  intravenous contrast. CONTRAST:  136mL OMNIPAQUE IOHEXOL 300 MG/ML  SOLN COMPARISON:  Chest CT 01/03/2018.  Abdominopelvic CT 11/25/2017. FINDINGS: CT CHEST FINDINGS Cardiovascular: A right Port-A-Cath which terminates at the low SVC. Aortic  and branch vessel atherosclerosis. Normal heart size, without pericardial effusion. Multivessel coronary artery atherosclerosis. Mediastinum/Nodes: No supraclavicular adenopathy. No mediastinal or hilar adenopathy. Tiny hiatal hernia. Lungs/Pleura: No pleural fluid. Focal right-sided pleural thickening medially is similar. Moderate centrilobular emphysema with lower lobe predominant bronchial wall thickening. Bilateral pulmonary nodules. Index left upper lobe pulmonary nodule measures 12 x 12 mm on 41/6 and is unchanged. Index anterior right upper lobe pulmonary nodule measures 8 x 5 mm on 63/6 and is slightly decreased from 9 x 8 mm on the prior. The remainder of the smaller pulmonary nodules are similar. Musculoskeletal: No acute osseous abnormality. CT ABDOMEN PELVIS FINDINGS Hepatobiliary: Bilateral hepatic cysts. Normal gallbladder, without biliary ductal dilatation. Pancreas: Pancreatic parenchymal versus vascular calcifications adjacent the common duct within the head. No duct dilatation or acute inflammation. Spleen: Normal in size, without focal abnormality. Adrenals/Urinary Tract: Normal adrenal glands. Favor early contrast excretion in the left kidney, given absence of stones on prior noncontrast exam. Too small to characterize lesions in both kidneys. Good renal collecting system opacification on delayed images. Suboptimal proximal to mid right ureteric opacification on delayed images. No ureteric filling defect identified. Cystectomy and ileal conduit creation, without acute complication. Stomach/Bowel: Normal remainder of the stomach. Otherwise normal large and small bowel. Vascular/Lymphatic: Aortic and branch vessel atherosclerosis. Multiple renal arteries  bilaterally. No abdominopelvic adenopathy. Reproductive: Prostatectomy. Other: No significant free fluid. Musculoskeletal: Right inferior pubic ramus lytic lesion with soft tissue component. Measures on the order of 5.2 x 5.1 cm on 92/9. Compare 4.0 by 3.5 cm on the prior exam (when remeasured). IMPRESSION: 1. Redemonstration of bilateral pulmonary metastasis. The majority of nodules are similar in size. One index nodule has minimally decreased. 2. Progression of right inferior pubic ramus osseous metastasis. 3. No new sites of soft tissue metastasis identified. 4. Cystoprostatectomy, as before. 5. Aortic atherosclerosis (ICD10-I70.0), coronary artery atherosclerosis and emphysema (ICD10-J43.9). Electronically Signed   By: Abigail Miyamoto M.D.   On: 03/31/2018 11:57   Ct Abdomen Pelvis W Contrast  Result Date: 03/31/2018 CLINICAL DATA:  Bladder cancer with cystectomy in 2016. Right hip metastasis. EXAM: CT CHEST, ABDOMEN, AND PELVIS WITH CONTRAST TECHNIQUE: Multidetector CT imaging of the chest, abdomen and pelvis was performed following the standard protocol during bolus administration of intravenous contrast. CONTRAST:  131mL OMNIPAQUE IOHEXOL 300 MG/ML  SOLN COMPARISON:  Chest CT 01/03/2018.  Abdominopelvic CT 11/25/2017. FINDINGS: CT CHEST FINDINGS Cardiovascular: A right Port-A-Cath which terminates at the low SVC. Aortic and branch vessel atherosclerosis. Normal heart size, without pericardial effusion. Multivessel coronary artery atherosclerosis. Mediastinum/Nodes: No supraclavicular adenopathy. No mediastinal or hilar adenopathy. Tiny hiatal hernia. Lungs/Pleura: No pleural fluid. Focal right-sided pleural thickening medially is similar. Moderate centrilobular emphysema with lower lobe predominant bronchial wall thickening. Bilateral pulmonary nodules. Index left upper lobe pulmonary nodule measures 12 x 12 mm on 41/6 and is unchanged. Index anterior right upper lobe pulmonary nodule measures 8 x 5 mm on  63/6 and is slightly decreased from 9 x 8 mm on the prior. The remainder of the smaller pulmonary nodules are similar. Musculoskeletal: No acute osseous abnormality. CT ABDOMEN PELVIS FINDINGS Hepatobiliary: Bilateral hepatic cysts. Normal gallbladder, without biliary ductal dilatation. Pancreas: Pancreatic parenchymal versus vascular calcifications adjacent the common duct within the head. No duct dilatation or acute inflammation. Spleen: Normal in size, without focal abnormality. Adrenals/Urinary Tract: Normal adrenal glands. Favor early contrast excretion in the left kidney, given absence of stones on prior noncontrast exam. Too small to characterize lesions in both  kidneys. Good renal collecting system opacification on delayed images. Suboptimal proximal to mid right ureteric opacification on delayed images. No ureteric filling defect identified. Cystectomy and ileal conduit creation, without acute complication. Stomach/Bowel: Normal remainder of the stomach. Otherwise normal large and small bowel. Vascular/Lymphatic: Aortic and branch vessel atherosclerosis. Multiple renal arteries bilaterally. No abdominopelvic adenopathy. Reproductive: Prostatectomy. Other: No significant free fluid. Musculoskeletal: Right inferior pubic ramus lytic lesion with soft tissue component. Measures on the order of 5.2 x 5.1 cm on 92/9. Compare 4.0 by 3.5 cm on the prior exam (when remeasured). IMPRESSION: 1. Redemonstration of bilateral pulmonary metastasis. The majority of nodules are similar in size. One index nodule has minimally decreased. 2. Progression of right inferior pubic ramus osseous metastasis. 3. No new sites of soft tissue metastasis identified. 4. Cystoprostatectomy, as before. 5. Aortic atherosclerosis (ICD10-I70.0), coronary artery atherosclerosis and emphysema (ICD10-J43.9). Electronically Signed   By: Abigail Miyamoto M.D.   On: 03/31/2018 11:57   Ir Removal [employer] W/ [place] W/o Fl Mod Sed  Result Date:  04/14/2018 CLINICAL DATA:  Bacteremia, concern for infection EXAM: REMOVAL OF IMPLANTED TUNNELED PORT-A-CATH MEDICATIONS: Ancef 2 g. The antibiotic was administered within 1 hour prior to the start of the procedure. ANESTHESIA/SEDATION: Moderate (conscious) sedation was employed during this procedure. A total of Versed 1.0 mg and Fentanyl 50 mcg was administered intravenously. Moderate Sedation Time: 10 minutes. The patient's level of consciousness and vital signs were monitored continuously by radiology nursing throughout the procedure under my direct supervision. FLUOROSCOPY TIME:  None PROCEDURE: Informed written consent was obtained from the patient after a discussion of the risk, benefits and alternatives to the procedure. The patient was positioned supine on the fluoroscopy table and the right chest Port-A-Cath site was prepped with chlorhexidine. A sterile gown and gloves were worn during the procedure. Local anesthesia was provided with 1% lidocaine with epinephrine. A timeout was performed prior to the initiation of the procedure. An incision was made overlying the Port-A-Cath with a #15 scalpel. Utilizing sharp and blunt dissection, the Port-A-Cath was removed completely. The pocked was irrigated with sterile saline. Wound closure was performed with subcutaneous 2-0 Vicryl, subcuticular 4-0 Vicryl, Dermabond and Steri-Strips. A dressing was placed. The patient tolerated the procedure well without immediate post procedural complication. FINDINGS: Successful removal of implant Port-A-Cath without immediate post procedural complication. IMPRESSION: Successful removal of implanted Port-A-Cath. Electronically Signed   By: Jerilynn Mages.  Shick M.D.   On: 04/14/2018 11:48     Microbiology: Recent Results (from the past 240 hour(s))  Urine Culture     Status: Abnormal   Collection Time: 04/10/18 10:17 PM  Result Value Ref Range Status   Specimen Description   Final    URINE, RANDOM Performed at [facility], [address] [place], [place] [address]    Special Requests   Final    NONE Performed at [facility], [address], [place], [place] [address]    Culture >=100,000 COLONIES/mL ESCHERICHIA COLI (A)  Final   Report Status 04/13/2018 FINAL  Final   Organism ID, Bacteria ESCHERICHIA COLI (A)  Final      Susceptibility   Escherichia coli - MIC*    AMPICILLIN 16 INTERMEDIATE Intermediate     CEFAZOLIN <=4 SENSITIVE Sensitive     CEFTRIAXONE <=1 SENSITIVE Sensitive     CIPROFLOXACIN >=4 RESISTANT Resistant     GENTAMICIN <=1 SENSITIVE Sensitive     IMIPENEM <=0.25 SENSITIVE Sensitive     NITROFURANTOIN <=16 SENSITIVE Sensitive  TRIMETH/SULFA <=20 SENSITIVE Sensitive     AMPICILLIN/SULBACTAM 4 SENSITIVE Sensitive     PIP/TAZO <=4 SENSITIVE Sensitive     Extended ESBL NEGATIVE Sensitive     * >=100,000 COLONIES/mL ESCHERICHIA COLI  Blood culture (routine x 2)     Status: Abnormal   Collection Time: 04/10/18 11:06 PM  Result Value Ref Range Status   Specimen Description   Final    LEFT ANTECUBITAL Performed at [facility], [address] [place], [place] [address]    Special Requests   Final    BOTTLES DRAWN AEROBIC AND ANAEROBIC Blood Culture adequate volume Performed at [facility], [address] [place], [place] [address]    Culture  Setup Time   Final    GRAM POSITIVE COCCI ANAEROBIC BOTTLE Gram Stain Report Called to,Read Back By and Verified With: ALSTON C. AT 0981 ON [id number] BY THOMPSON S. CRITICAL RESULT CALLED TO, READ BACK BY AND VERIFIED WITH: C ALSTON RN 04/11/18 1817 JDW    Culture (A)  Final    ROTHIA MUCILAGINOSA Standardized susceptibility testing for this organism is not available. STREPTOCOCCUS SPECIES IN BCID DID NOT GROW IN CULTURE Performed at [place] [address], [place], [place] [address]    Report Status 04/13/2018 FINAL  Final  Blood Culture ID Panel (Reflexed)     Status: Abnormal   Collection Time: 04/10/18 11:06 PM  Result  Value Ref Range Status   Enterococcus species NOT DETECTED NOT DETECTED Final   Listeria monocytogenes NOT DETECTED NOT DETECTED Final   Staphylococcus species NOT DETECTED NOT DETECTED Final   Staphylococcus aureus NOT DETECTED NOT DETECTED Final   Streptococcus species DETECTED (A) NOT DETECTED Final    Comment: Not Enterococcus species, Streptococcus agalactiae, Streptococcus pyogenes, or Streptococcus pneumoniae. CRITICAL RESULT CALLED TO, READ BACK BY AND VERIFIED WITH: C ALSTON RN 04/11/18 1817 JDW    Streptococcus agalactiae NOT DETECTED NOT DETECTED Final   Streptococcus pneumoniae NOT DETECTED NOT DETECTED Final   Streptococcus pyogenes NOT DETECTED NOT DETECTED Final   Acinetobacter baumannii NOT DETECTED NOT DETECTED Final   Enterobacteriaceae species NOT DETECTED NOT DETECTED Final   Enterobacter cloacae complex NOT DETECTED NOT DETECTED Final   Escherichia coli NOT DETECTED NOT DETECTED Final   Klebsiella oxytoca NOT DETECTED NOT DETECTED Final   Klebsiella pneumoniae NOT DETECTED NOT DETECTED Final   Proteus species NOT DETECTED NOT DETECTED Final   Serratia marcescens NOT DETECTED NOT DETECTED Final   Haemophilus influenzae NOT DETECTED NOT DETECTED Final   Neisseria meningitidis NOT DETECTED NOT DETECTED Final   Pseudomonas aeruginosa NOT DETECTED NOT DETECTED Final   Candida albicans NOT DETECTED NOT DETECTED Final   Candida glabrata NOT DETECTED NOT DETECTED Final   Candida krusei NOT DETECTED NOT DETECTED Final   Candida parapsilosis NOT DETECTED NOT DETECTED Final   Candida tropicalis NOT DETECTED NOT DETECTED Final    Comment: Performed at [facility], [place] [address], [place], [place] [address]  Blood culture (routine x 2)     Status: None   Collection Time: 04/10/18 11:40 PM  Result Value Ref Range Status   Specimen Description BLOOD LEFT HAND  Final   Special Requests   Final    BOTTLES DRAWN AEROBIC ONLY Blood Culture adequate volume   Culture   Final     NO GROWTH 5 DAYS Performed at [facility], [address], [place], [place] [address]    Report Status 04/15/2018 FINAL  Final  Culture,  blood (Routine X 2) w Reflex to ID Panel     Status: None   Collection Time: 04/13/18  1:58 PM  Result Value Ref Range Status   Specimen Description LEFT ANTECUBITAL  Final   Special Requests   Final    BOTTLES DRAWN AEROBIC AND ANAEROBIC Blood Culture adequate volume   Culture   Final    NO GROWTH 5 DAYS Performed at [facility], [address], [place], [place] [address]    Report Status 04/18/2018 FINAL  Final  Culture, blood (Routine X 2) w Reflex to ID Panel     Status: None   Collection Time: 04/13/18  2:03 PM  Result Value Ref Range Status   Specimen Description BLOOD CENTRAL LINE  Final   Special Requests   Final    BOTTLES DRAWN AEROBIC AND ANAEROBIC Blood Culture adequate volume   Culture   Final    NO GROWTH 5 DAYS Performed at [facility], [address], [place], [place] [address]    Report Status 04/18/2018 FINAL  Final     Labs: Basic Metabolic Panel: Recent Labs  Lab 04/12/18 0518 04/13/18 0537 04/15/18 0710 04/18/18 0627  NA 138 140 140 143  K 3.7 3.8 3.6 3.5  CL 104 106 106 110  CO2 27 26 28 28   GLUCOSE 119* 94 94 101*  BUN 11 11 14 9   CREATININE 0.62 0.61 0.60* 0.60*  CALCIUM 8.1* 8.3* 8.3* 8.6*  MG  --   --   --  2.0   Liver Function Tests: No results for input(s): AST, ALT, ALKPHOS, BILITOT, PROT, ALBUMIN in the last 168 hours. No results for input(s): LIPASE, AMYLASE in the last 168 hours. No results for input(s): AMMONIA in the last 168 hours. CBC: Recent Labs  Lab 04/13/18 0537 04/14/18 1527 04/15/18 0710 04/16/18 1427 04/17/18 0627 04/18/18 0627  WBC 1.2* 7.3 6.1 3.8* 2.6* 2.8*  NEUTROABS 0.9* 7.0 5.6  --  1.8  --   HGB 7.4* 6.7* 8.5* 9.1* 9.1* 8.8*  HCT 23.3* 21.0* 26.3* 28.1* 28.1* 27.5*  MCV 100.4* 100.0 94.3 93.4 93.4 94.2  PLT 43* 32* 24* 21* 19* 40*   Cardiac Enzymes: No results  for input(s): CKTOTAL, CKMB, CKMBINDEX, TROPONINI in the last 168 hours. BNP: Invalid input(s): POCBNP CBG: No results for input(s): GLUCAP in the last 168 hours.  Time coordinating discharge:  36 minutes  Signed:  Orson Eva, DO [facility] Pager: [phone] 04/18/2018, 9:13 AM

## 2018-04-18 NOTE — Progress Notes (Signed)
Platelets started and IV site began to leak. IV site change with platelets restarted. VSS.

## 2018-04-19 ENCOUNTER — Telehealth: Payer: Self-pay | Admitting: *Deleted

## 2018-04-19 ENCOUNTER — Other Ambulatory Visit: Payer: Self-pay

## 2018-04-19 ENCOUNTER — Encounter (HOSPITAL_COMMUNITY): Payer: Self-pay

## 2018-04-19 ENCOUNTER — Emergency Department (HOSPITAL_COMMUNITY)
Admission: EM | Admit: 2018-04-19 | Discharge: 2018-04-20 | Disposition: A | Discharge status: Home / Self Care | Payer: Medicare Other | Attending: Emergency Medicine | Admitting: Emergency Medicine

## 2018-04-19 DIAGNOSIS — G893 Neoplasm related pain (acute) (chronic): Secondary | ICD-10-CM | POA: Diagnosis not present

## 2018-04-19 DIAGNOSIS — J449 Chronic obstructive pulmonary disease, unspecified: Secondary | ICD-10-CM | POA: Insufficient documentation

## 2018-04-19 DIAGNOSIS — C7800 Secondary malignant neoplasm of unspecified lung: Secondary | ICD-10-CM | POA: Diagnosis not present

## 2018-04-19 DIAGNOSIS — C679 Malignant neoplasm of bladder, unspecified: Secondary | ICD-10-CM | POA: Diagnosis not present

## 2018-04-19 DIAGNOSIS — D61810 Antineoplastic chemotherapy induced pancytopenia: Secondary | ICD-10-CM | POA: Diagnosis not present

## 2018-04-19 DIAGNOSIS — Z936 Other artificial openings of urinary tract status: Secondary | ICD-10-CM | POA: Diagnosis not present

## 2018-04-19 DIAGNOSIS — Z87891 Personal history of nicotine dependence: Secondary | ICD-10-CM | POA: Insufficient documentation

## 2018-04-19 DIAGNOSIS — R7881 Bacteremia: Secondary | ICD-10-CM | POA: Diagnosis not present

## 2018-04-19 DIAGNOSIS — Z452 Encounter for adjustment and management of vascular access device: Secondary | ICD-10-CM | POA: Diagnosis not present

## 2018-04-19 DIAGNOSIS — Z8551 Personal history of malignant neoplasm of bladder: Secondary | ICD-10-CM | POA: Diagnosis not present

## 2018-04-19 DIAGNOSIS — T7840XA Allergy, unspecified, initial encounter: Secondary | ICD-10-CM | POA: Insufficient documentation

## 2018-04-19 DIAGNOSIS — T3695XA Adverse effect of unspecified systemic antibiotic, initial encounter: Secondary | ICD-10-CM | POA: Diagnosis not present

## 2018-04-19 DIAGNOSIS — B9689 Other specified bacterial agents as the cause of diseases classified elsewhere: Secondary | ICD-10-CM | POA: Diagnosis not present

## 2018-04-19 DIAGNOSIS — R51 Headache: Secondary | ICD-10-CM | POA: Diagnosis not present

## 2018-04-19 DIAGNOSIS — Z792 Long term (current) use of antibiotics: Secondary | ICD-10-CM | POA: Diagnosis not present

## 2018-04-19 DIAGNOSIS — Z5181 Encounter for therapeutic drug level monitoring: Secondary | ICD-10-CM | POA: Diagnosis not present

## 2018-04-19 DIAGNOSIS — C7951 Secondary malignant neoplasm of bone: Secondary | ICD-10-CM | POA: Diagnosis not present

## 2018-04-19 DIAGNOSIS — R21 Rash and other nonspecific skin eruption: Secondary | ICD-10-CM | POA: Diagnosis present

## 2018-04-19 DIAGNOSIS — Z79891 Long term (current) use of opiate analgesic: Secondary | ICD-10-CM | POA: Diagnosis not present

## 2018-04-19 MED ORDER — DIPHENHYDRAMINE HCL 50 MG/ML IJ SOLN
25.0000 mg | Freq: Once | INTRAMUSCULAR | Status: AC
  Administered 2018-04-19: 25 mg via INTRAVENOUS
  Filled 2018-04-19: qty 1

## 2018-04-19 MED ORDER — METHYLPREDNISOLONE SODIUM SUCC 125 MG IJ SOLR
80.0000 mg | Freq: Once | INTRAMUSCULAR | Status: AC
  Administered 2018-04-19: 80 mg via INTRAVENOUS
  Filled 2018-04-19: qty 2

## 2018-04-19 MED ORDER — SULFAMETHOXAZOLE-TRIMETHOPRIM 800-160 MG PO TABS: 1.0000 | ORAL_TABLET | Freq: Two times a day (BID) | ORAL | 0 refills | Status: DC

## 2018-04-19 MED ORDER — FAMOTIDINE IN NACL 20-0.9 MG/50ML-% IV SOLN
20.0000 mg | Freq: Once | INTRAVENOUS | Status: AC
  Administered 2018-04-19: 20 mg via INTRAVENOUS
  Filled 2018-04-19: qty 50

## 2018-04-19 NOTE — Telephone Encounter (Signed)
He can cancel his appointment on 9/9 but keep 9/16. It's too soon start chemo on 9/9 but possibly on 9/16.

## 2018-04-19 NOTE — Discharge Instructions (Addendum)
Please return to the Emergency Department for any new or worsening symptoms or if your symptoms do not improve. Please be sure to follow up with your Primary Care Physician as soon as possible regarding your visit today. If you do not have a Primary Doctor please use the resources below to establish one. You must call Dr. Algis Downs office first thing tomorrow morning to schedule a follow-up appointment for as soon as possible.  Dr. Johnnye Sima is an infectious disease physician who has asked [place] to start you on Bactrim. Please take your antibiotic medication Bactrim as prescribed.  Contact a health care provider if: You develop symptoms of an allergic reaction. You may notice them soon after you are exposed to a substance. The symptoms may include: Rash. Headache. Sneezing or a runny nose. Swelling. Nausea. Diarrhea. Get help right away if: You needed to use epinephrine. An epinephrine injection helps to manage life-threatening allergic reactions, but you still need to go to the emergency room even if epinephrine seems to work. This is important because anaphylaxis may happen again within 72 hours (rebound anaphylaxis). If you used epinephrine to treat anaphylaxis outside of the hospital, you need additional medical care. This may include more doses of epinephrine. You develop: A tight feeling in your chest or throat. Wheezing or difficulty breathing. Hives. Red skin or itching all over your body. Swelling in your lips, tongue, or the back of your throat. You have severe vomiting or diarrhea. You faint or you feel like you are going to faint. Contact a health care provider if: You think that you are having an allergic reaction. Symptoms of an allergic reaction usually start within 30 minutes after you are exposed to a medicine. You have symptoms that last more than 2 days after your reaction. Your symptoms get worse. You develop new symptoms. Get help right away if: You needed to use  epinephrine. An epinephrine injection helps to manage life-threatening allergic reactions, but you still need to go to the emergency room even if epinephrine seems to work. This is important because anaphylaxis may happen again within 72 hours (rebound anaphylaxis). If you used epinephrine to treat anaphylaxis outside of the hospital, you need additional medical care. This may include more doses of epinephrine. You develop symptoms of a severe allergic reaction.  RESOURCE GUIDE  Chronic Pain Problems: Contact [facility]  [phone] Patients need to be referred by their primary care doctor.  Insufficient Money for Medicine: Contact [employer]:  call "211" or [place] [phone].  No Primary Care Doctor: Call [facility]  [phone] - can help you locate a primary care doctor that  accepts your insurance, provides certain services, etc. Physician Referral [phone]  Agencies that provide inexpensive medical care: [facility]  [facility]  [phone] [facility]  [phone] [facility]  [phone] [facility]  [phone] [facility]  [phone]  [place] Providers: [facility]- [address]  [phone], Mon-Fri 9am-7pm, Sat 9am-1pm [place], [address] [place]  [place], [address] [place]  [facility]- [address]  [place], [address], [phone]  Only accepts [place] patients after they have their name  applied to their card  Self Pay (no insurance) in [facility]: Sickle Cell Patients: Dr Kevan Ny,  [facility]  [place], [facility] [place]  [facility]  [place]- [address], [facility]- see information above (Speak to [employer] if you do not have insurance)       -  [facility]- [place], [place],  [place], [place]  Dr Vista Lawman-  [address], [place], [facility]- [address] [phone]       -  [facility]- [address], [place], also [address] [phone]       -    [facility]- [address], [place], 1st & 3rd Saturday   every month, 10am-1pm  1) Find a Doctor and Pay Out of Pocket Although you won't have to find out who is covered by your insurance plan, it is a good idea to ask around and get recommendations. You will then need to call the office and see if the doctor you have chosen will accept you as a new patient and what types of options they offer for patients who are self-pay. Some doctors offer discounts or will set up payment plans for their patients who do not have insurance, but you will need to ask so you aren't surprised when you get to your appointment.  2) Contact Your Local Health Department Not all health departments have doctors that can see patients for sick visits, but many do, so it is worth a call to see if yours does. If you don't know where your local health department is, you can check in your phone book. The CDC also has a tool to help you locate your state's health department, and many state websites also have listings of all of their local health departments.  3) Find a [place] [facility] If your illness is not likely to be very severe or complicated, you may want to try a walk in clinic. These are popping up all over the country in pharmacies, drugstores, and shopping centers. They're usually staffed by nurse practitioners or physician assistants that  have been trained to treat common illnesses and complaints. They're usually fairly quick and inexpensive. However, if you have serious medical issues or chronic medical problems, these are probably not your best option  [facility] [place], [facility], [address] [place], phone [phone] or [phone].  Monday - Friday, call for an appointment. [place], [facility], [address], [place], phone [phone] or [phone].  Monday - Friday, call for an appointment.  Abuse/Neglect: [place] [phone] [place] [phone] (After Hours)  Emergency Shelter:  [facility] [phone]  Maternity Homes: Room at the [place] [phone] [place] [phone]  MRSA Hotline #:   (860)121-3361  [facility]. [address]  [place] Phone:  [phone]                                  Phone:  [phone]                   Phone:  [phone]  [facility], [place]- [phone]       -     [facility] in [place], [address],                                  [place] [phone] or [phone] (After Hours)   [place]  Substance Abuse Resources: Alcohol and Drug Services  [phone] [place] [phone] [facility] [place] Chinita P[place] [phone] Residential & Outpatient Substance Abuse Program   [phone]  Psychological Services: [place]  [phone] [place]  [place], [place]. [address] [place], [place]: [phone] or [phone], [employer]  Dental Assistance  If unable to pay or uninsured, contact:  [facility] or [facility]. to become qualified for the adult dental clinic.  Patients with Medicaid: [facility] [phone] [address], [place] [address], [phone]  If unable to pay, or uninsured, contact [facility] [phone]) or [place] [phone] in [place], [place] in [facility]) to become qualified for the adult dental clinic   Other [place]- [place], [place], [place], [address], [place], [place], 2nd and 4th Thursday of the month at 6:30am.  10 clients each day by appointment, can sometimes see walk-in patients if someone does not show for an appointment. [facility]- [address] [place]ard Danker [place], [place], [address], [place], [place], [place], [address], [place] Department- [phone] [place] [facility] [phone]

## 2018-04-19 NOTE — Telephone Encounter (Signed)
Spoke with [name] regarding patient is having itching even though he is taking Atarax. Per Dr. Alen Blew, he can have Benadryl 25 mg po Q6 hours as needed. [name] verbalized understanding.

## 2018-04-19 NOTE — ED Provider Notes (Addendum)
[place] DEPT Provider Note   CSN: [id number] Arrival date & time: 04/19/18  2057     History   Chief Complaint Chief Complaint  Patient presents with  . Rash    HPI [name] is a 77 y.o. male with history of stage IV bladder cancer currently receiving chemotherapy presenting for apparent medication reaction.  Patient recently discharged following inpatient for complicated UTI during which he was receiving cefepime with improvement.  Patient discharged with new PICC line to left upper extremity after removal of infected Port-A-Cath.  Patient received first dose of cefepime this morning at 9 AM, shortly after he developed rash and itching to his torso.  Patient took 2 Benadryl at home without relief on examination today patient still with rest and itch.  Patient denies facial swelling, shortness of breath, feeling of throat closing or nausea/vomiting.  HPI  Past Medical History:  Diagnosis Date  . Bladder cancer ([place])   . BPH (benign prostatic hypertrophy)   . Dysuria   . GERD (gastroesophageal reflux disease)   . History of adenomatous polyp of colon    tubular adenoma 06/ 2018  . History of bladder cancer 12/2014   urologist-  dr [name]--  dx High Grade TCC without stromal invasion s/p TURBT and intravesical BCG tx/  recurrent 07/2015 (pTa) TCC  repear intravesical BCG tx   . History of hiatal hernia   . History of urethral stricture   . Nocturia     Patient Active Problem List   Diagnosis Date Noted  . Febrile neutropenia ([place]) 04/13/2018  . Gram positive sepsis ([place]) 04/13/2018  . SIRS (systemic inflammatory response syndrome) ([place]) 04/12/2018  . Bacteremia due to Gram-positive bacteria 04/12/2018  . Pancytopenia ([place]) 04/12/2018  . Status post chemotherapy   . UTI (urinary tract infection) 04/11/2018  . Port-A-Cath in place 02/20/2018  . Encounter for antineoplastic chemotherapy 02/17/2018  . Sepsis due to urinary tract  infection ([place]) 01/25/2018  . Sepsis secondary to UTI ([place]) 01/25/2018  . Hyperglycemia   . Goals of care, counseling/discussion 01/18/2018  . Bone metastasis ([place]) 12/29/2017  . 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 Bladder cancer ([place]) 06/28/2017  . Malignant neoplasm of urinary bladder ([place]) 04/20/2017  . COPD UNSPECIFIED 03/07/2008    Past Surgical History:  Procedure Laterality Date  . CARDIAC CATHETERIZATION  1997   normal coronary arteries  . CARDIOVASCULAR STRESS TEST  12-14-2005   Low risk perfusion study/  very small scar in the inferior septum from mid ventricle to apex,  no ischemia/  mild inferior septal hypokinesis/  ef 58%  . CATARACT EXTRACTION W/ INTRAOCULAR LENS  IMPLANT, BILATERAL  2011  . COLONOSCOPY  last one 06/ 2018  . CYSTOSCOPY WITH BIOPSY N/A 08/12/2015   Procedure: CYSTOSCOPY WITH BIOPSY AND FULGERATION;  Surgeon: [name], MD;  Location: [facility];  Service: Urology;  Laterality: N/A;  . CYSTOSCOPY WITH INJECTION N/A 06/29/2017   Procedure: CYSTOSCOPY WITH INJECTION OF INDOCYANINE GREEN DYE;  Surgeon: Alexis Frock, MD;  Location: [facility];  Service: Urology;  Laterality: N/A;  . CYSTOSCOPY WITH URETHRAL DILATATION N/A 03/21/2017   Procedure: CYSTOSCOPY;  Surgeon: [name], MD;  Location: [facility];  Service: Urology;  Laterality: N/A;  . ESOPHAGOGASTRODUODENOSCOPY  12-05-2014  . IR IMAGING GUIDED PORT INSERTION  02/07/2018  . IR REMOVAL TUN ACCESS W/ PORT W/O FL MOD SED  04/14/2018  . TRANSTHORACIC ECHOCARDIOGRAM  01-31-2008  nomral LVF,  ef 55-65%/  mild pulmonic stenosis without regurg.,  peak transpulomonic valve gradient 61mmHg  . TRANSURETHRAL RESECTION OF BLADDER TUMOR N/A 12/31/2014   Procedure: TRANSURETHRAL RESECTION OF BLADDER TUMOR (TURBT);  Surgeon: Lowella Bandy, MD;  Location: [facility];  Service: Urology;  Laterality: N/A;  . TRANSURETHRAL RESECTION OF BLADDER TUMOR N/A  03/21/2017   Procedure: POSSIBLE TRANSURETHRAL RESECTION OF BLADDER TUMOR (TURBT);  Surgeon: [name], MD;  Location: [facility];  Service: Urology;  Laterality: N/A;        Home Medications    Prior to Admission medications   Medication Sig Start Date End Date Taking? Authorizing Provider  acetaminophen (TYLENOL) 500 MG tablet Take 1,000 mg by mouth every 6 (six) hours as needed for fever.    Yes [provider]  diphenhydrAMINE (BENADRYL) 25 MG tablet Take 25 mg by mouth every 6 (six) hours as needed for itching.   Yes [provider]  feeding supplement, ENSURE ENLIVE, (ENSURE ENLIVE) LIQD Take 237 mLs by mouth 2 (two) times daily between meals. 04/18/18  Yes Tat, Shanon Brow, MD  HYDROmorphone (DILAUDID) 4 MG tablet Take 1 tablet (4 mg total) by mouth every 4 (four) hours as needed for severe pain. 04/03/18  Yes [name], MD  hydrOXYzine (ATARAX/VISTARIL) 50 MG tablet Take 1 tablet (50 mg total) by mouth 4 (four) times daily as needed. Patient taking differently: Take 50 mg by mouth 4 (four) times daily as needed for itching.  04/03/18  Yes [name], MD  lactose free nutrition (BOOST) LIQD Take 237 mLs by mouth 3 (three) times daily between meals.   Yes [provider]  oxyCODONE (OXY IR/ROXICODONE) 5 MG immediate release tablet Take 1-2 tablets (5-10 mg total) by mouth every 4 (four) hours as needed for severe pain. 04/03/18  Yes [name], MD  polyethylene glycol (MIRALAX / GLYCOLAX) packet Take 17 g by mouth daily. 04/18/18  Yes Tat, Shanon Brow, MD  prochlorperazine (COMPAZINE) 10 MG tablet Take 1 tablet (10 mg total) by mouth every 6 (six) hours as needed for nausea or vomiting. 01/18/18   [name], MD  sulfamethoxazole-trimethoprim (BACTRIM DS,SEPTRA DS) 800-160 MG tablet Take 1 tablet by mouth 2 (two) times daily for 7 days. 04/19/18 04/26/18  Deliah Boston, PA-C    Family History Family History  Problem Relation Age of  Onset  . Colon cancer Neg Hx   . Colon polyps Neg Hx   . Diabetes Neg Hx   . Kidney disease Neg Hx   . Esophageal cancer Neg Hx   . Heart disease Neg Hx   . Gallbladder disease Neg Hx     Social History Social History   Tobacco Use  . Smoking status: Former Smoker    Packs/day: 1.00    Years: 34.00    Pack years: 34.00    Types: Cigarettes    Last attempt to quit: 12/26/1988    Years since quitting: 29.3  . Smokeless tobacco: Never Used  Substance Use Topics  . Alcohol use: Yes    Alcohol/week: 6.0 standard drinks    Types: 6 Cans of beer per week    Comment: BEER  . Drug use: No     Allergies   Ciprofloxacin   Review of Systems Review of Systems  Constitutional: Negative.  Negative for chills and fever.  HENT: Negative.  Negative for facial swelling, rhinorrhea, sore throat, trouble swallowing and voice change.  Eyes: Negative.  Negative for visual disturbance.  Respiratory: Negative.  Negative for cough and shortness of breath.   Cardiovascular: Negative.  Negative for chest pain.  Gastrointestinal: Negative.  Negative for abdominal pain, blood in stool, diarrhea, nausea and vomiting.  Genitourinary: Negative.  Negative for dysuria and hematuria.  Musculoskeletal: Negative.  Negative for arthralgias and myalgias.  Skin: Positive for color change and rash.  Neurological: Negative.  Negative for dizziness, weakness and headaches.     Physical Exam Updated Vital Signs BP 127/77 (BP Location: Left Arm)   Pulse 88   Temp 98.6 F (37 C) (Oral)   Resp 18   SpO2 99%   Physical Exam  Constitutional: He is oriented to person, place, and time. He appears well-developed and well-nourished. No distress.  HENT:  Head: Normocephalic and atraumatic.  Right Ear: External ear normal.  Left Ear: External ear normal.  Nose: Nose normal.  Mouth/Throat: Uvula is midline, oropharynx is clear and moist and mucous membranes are normal. No uvula swelling.  Eyes: Pupils are  equal, round, and reactive to light. EOM are normal.  Neck: Trachea normal, normal range of motion and full passive range of motion without pain. Neck supple. No tracheal deviation present.  Cardiovascular: Normal rate, regular rhythm, normal heart sounds and intact distal pulses.  Pulmonary/Chest: Effort normal and breath sounds normal. No respiratory distress. He has no wheezes.  Abdominal: Soft. There is no tenderness. There is no rebound and no guarding.  Musculoskeletal: Normal range of motion.  Neurological: He is alert and oriented to person, place, and time.  Skin: Skin is warm and dry. Rash noted.     Diffuse erythematous blanchable rash to chest, lower abdomen, upper back and scattered upper extremities.  No desquamation noted, no superimposed bacterial infection noted, no palmar/sole or mucosal involvement.  Psychiatric: He has a normal mood and affect. His behavior is normal.     ED Treatments / Results  Labs (all labs ordered are listed, but only abnormal results are displayed) Labs Reviewed - No data to display  EKG None  Radiology No results found.  Procedures Procedures (including critical care time)  Medications Ordered in ED Medications  diphenhydrAMINE (BENADRYL) injection 25 mg (25 mg Intravenous Given 04/19/18 2303)  famotidine (PEPCID) IVPB 20 mg premix (0 mg Intravenous Stopped 04/19/18 2347)  methylPREDNISolone sodium succinate (SOLU-MEDROL) 125 mg/2 mL injection 80 mg (80 mg Intravenous Given 04/19/18 2330)     Initial Impression / Assessment and Plan / ED Course  I have reviewed the triage vital signs and the nursing notes.  Pertinent labs & imaging results that were available during my care of the patient were reviewed by me and considered in my medical decision making (see chart for details).  Clinical Course as of Apr 20 117  Wed Apr 19, 2018  2328 Consult called to Infectious Disease Dr. Johnnye Sima who advises that the patient be started on Bactrim  double strength BID and follow-up with their office as soon as possible.   [BM]    Clinical Course User Index [BM] Deliah Boston, PA-C   Patient presenting for apparent drug reaction after IV dose of cefepime.  Patient has been receiving this medication for the past few days while admitted at [facility].  However today was his first dose at home when he developed the reaction a few moments later.  Patient treated with Benadryl, Pepcid and Solu-Medrol with relief of symptoms.  Patient informed to avoid using this medication to  avoid further allergic reaction.  Patient re-evaluated prior to dc, is hemodynamically stable, in no respiratory distress, and denies the feeling of throat closing. Pt has been advised to take OTC benadryl & return to the ED if they have s/s including throat closing, difficulty breathing, swelling of lips face or tongue.  Patient's rash has greatly decreased in size and color.  Minimal erythema and pruritus to the upper chest only at time of discharge.  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  Patient also with concerning echocardiogram that is needing follow-up TEE.  This was discussed with infectious disease Dr. Johnnye Sima who advised starting the patient on Bactrim twice daily with prompt follow-up in his office.   Patient and family members were informed of care plan and are agreeable.  At this time there does not appear to be any evidence of an acute emergency medical condition and the patient appears stable for discharge with appropriate outpatient follow up. Diagnosis was discussed with patient who verbalizes understanding of care plan and is agreeable to discharge. I have discussed return precautions with patient and family at bedside who verbalize understanding of return precautions. Patient strongly encouraged to follow-up with their PCP. All questions answered.  Patient's case discussed  with and patient seen and evaluated by Dr. Ashok Cordia who agrees with discharge and outpatient follow-up at this time.    Note: Portions of this report may have been transcribed using voice recognition software. Every effort was made to ensure accuracy; however, inadvertent computerized transcription errors may still be present.   Final Clinical Impressions(s) / ED Diagnoses   Final diagnoses:  Allergic reaction, initial encounter    ED Discharge Orders         Ordered    sulfamethoxazole-trimethoprim (BACTRIM DS,SEPTRA DS) 800-160 MG tablet  2 times daily     04/19/18 2336           Deliah Boston, PA-C 04/20/18 0120    Deliah Boston, PA-C 04/20/18 0121    Lajean Saver, MD 04/20/18 1455

## 2018-04-19 NOTE — Telephone Encounter (Signed)
Received voice mail message from patient stating,"I was released from the hospital and I wanted to ask Dr. Alen Blew am I going to continue chemotherapy? [name], is my driver, and I will need to work with her schedule to get there. Her number is [phone]."  Re: patient is scheduled for lab/MD/flush/chemotherapy on 04/24/18.

## 2018-04-19 NOTE — ED Notes (Signed)
Pt took two benadryls at home before arriving, pt is still complaining of itching

## 2018-04-19 NOTE — Telephone Encounter (Signed)
Spoke with [name] regarding chemotherapy schedule. Per Dr. Alen Blew, cancel all appointments on 04/24/18 due to patient being on IV antibiotics from the hospital. He will resume chemotherapy on 9/16 if everything looks good. [name] verbalized understanding.

## 2018-04-19 NOTE — ED Triage Notes (Signed)
Pt was just released from the hospital after nine days for an infected port a cath, pt has a PICC line line and was sent home with cefipime to have throught his PICC, his wife has given him one dose and he has started itching and has a rash

## 2018-04-20 ENCOUNTER — Ambulatory Visit: Payer: Medicare Other | Admitting: Infectious Diseases

## 2018-04-20 ENCOUNTER — Encounter: Payer: Self-pay | Admitting: Infectious Diseases

## 2018-04-20 ENCOUNTER — Telehealth: Payer: Self-pay

## 2018-04-20 ENCOUNTER — Ambulatory Visit (INDEPENDENT_AMBULATORY_CARE_PROVIDER_SITE_OTHER): Payer: Medicare Other | Admitting: Infectious Diseases

## 2018-04-20 ENCOUNTER — Telehealth: Payer: Self-pay | Admitting: *Deleted

## 2018-04-20 DIAGNOSIS — R7881 Bacteremia: Secondary | ICD-10-CM | POA: Diagnosis not present

## 2018-04-20 DIAGNOSIS — C679 Malignant neoplasm of bladder, unspecified: Secondary | ICD-10-CM | POA: Diagnosis not present

## 2018-04-20 DIAGNOSIS — T827XXA Infection and inflammatory reaction due to other cardiac and vascular devices, implants and grafts, initial encounter: Secondary | ICD-10-CM | POA: Diagnosis not present

## 2018-04-20 DIAGNOSIS — R319 Hematuria, unspecified: Secondary | ICD-10-CM

## 2018-04-20 DIAGNOSIS — N390 Urinary tract infection, site not specified: Secondary | ICD-10-CM | POA: Diagnosis not present

## 2018-04-20 NOTE — Telephone Encounter (Signed)
Per Dr. Johnnye Sima faxed order to [facility] on 04/20/18. To change current antibiotic to vancomyocin per pharmacy protocol  x5 weeks. Weekly CBS, CMP, Vanc trough. Fax labs to our office,and ordered to pull PICC at completion of therapy.

## 2018-04-20 NOTE — Progress Notes (Signed)
   Subjective:    Patient ID: [name], male    DOB: 04/29/1941, 77 y.o.   MRN: [id number]  HPI 77 yo M with hx of bladder CA, ilieal conduit, receiveing CTX 8-26 (carboplatin/gemcitabine day 7/8 on 8-25, 4th cycle), then adm to [facility] 8-26 to 9-3 with fever and Rothia mucilagnosa bacteremia as well as TTE showing TV nodular echodensity. He was felt to be too high risk for TEE.  His port was removed.  He was started on cefepime in the hospital and at d/c he continued on this. His planned stop date was 05-25-18.  His UCx grew E coli (I- amp, R- cipro).  Last pm he took his home dose and developed diffuse rash.  I started him on po bactrim to bridge him til he could be seen in ID clinic. He has not started this yet.   The past medical history, family history and social history were reviewed/updated in EPIC Quit ETOH 1 year ago, no Tobacco x 30 yrs.   Review of Systems  Constitutional: Negative for chills and fever.  HENT: Negative for mouth sores and trouble swallowing.   Gastrointestinal: Negative for constipation and diarrhea.  Skin: Positive for rash.  Hematological: Does not bruise/bleed easily.  Please see HPI. All other systems reviewed and negative.      Objective:   Physical Exam  Constitutional: He is oriented to person, place, and time. No distress.  HENT:  Mouth/Throat: No oropharyngeal exudate.  Eyes: Pupils are equal, round, and reactive to light. EOM are normal.  Neck: Normal range of motion. Neck supple.  Cardiovascular: Tachycardia present.  Pulmonary/Chest: Effort normal and breath sounds normal.  Abdominal: Soft. Bowel sounds are normal. He exhibits no distension. There is no tenderness.    Lymphadenopathy:    He has no cervical adenopathy.  Neurological: He is alert and oriented to person, place, and time.  Skin: Rash noted. He is not diaphoretic.         Assessment & Plan:

## 2018-04-20 NOTE — Telephone Encounter (Signed)
Spoke with Dr Johnnye Sima. He wants to see [name] today at 11:30. RN called patient and his wife. They were concerned that he would miss his primary care physician appointment at 10:45, that they weren't able to come to [facility] on their own today. [name] asked me to call her daughter [name] to ask her to drive them ([phone]), asked me to let PCP know they would have to cancel their appointment. [name] asked for an afternoon appointment, stating she was at work, could not come any earlier.  Per Dr Johnnye Sima, RN offered appointment at 2:30. [name] accepted, stating she would find a way to get them there. RN called Dr Nolon Rod office, explained it was imperative we see [name] today, asked them to cancel his appointment today and not charge him a no-show fee.  Their computer system was down, so the attendant stated she would write the information down. [name] called back, asked for the 11:30 appointment instead, asked for this nurse to schedule PCP appointment for this evening. RN advised his PCP's office was experiencing computer issues, that they were unable to schedule anything at this time, asked that he call them later to see if they are able to schedule at that time. Lance Gandy, RN   Campbell Riches, MD  Lance Gandy, RN; Ivar Drape, CMA        This pt needs an appt ASAP  W/i 24h

## 2018-04-20 NOTE — Assessment & Plan Note (Signed)
Will change his anbx to vancomycin ([facility] June 2014) Will treat him for 6 weeks as though he has IE due to TV lesion.  Will repeat his BCx at end of rx.  Will see him back in 6 weeks.  Will cc his PCP/Onc MD.

## 2018-04-20 NOTE — Assessment & Plan Note (Signed)
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

## 2018-04-20 NOTE — Assessment & Plan Note (Signed)
Would consider holding his chemo while he is on anbx but will defer to Dr Alen Blew.

## 2018-04-21 ENCOUNTER — Telehealth: Payer: Self-pay | Admitting: Infectious Diseases

## 2018-04-21 ENCOUNTER — Telehealth: Payer: Self-pay

## 2018-04-21 ENCOUNTER — Other Ambulatory Visit: Payer: Self-pay

## 2018-04-21 DIAGNOSIS — C7951 Secondary malignant neoplasm of bone: Secondary | ICD-10-CM | POA: Diagnosis not present

## 2018-04-21 DIAGNOSIS — C679 Malignant neoplasm of bladder, unspecified: Secondary | ICD-10-CM | POA: Diagnosis not present

## 2018-04-21 DIAGNOSIS — B9689 Other specified bacterial agents as the cause of diseases classified elsewhere: Secondary | ICD-10-CM | POA: Diagnosis not present

## 2018-04-21 DIAGNOSIS — R7881 Bacteremia: Secondary | ICD-10-CM | POA: Diagnosis not present

## 2018-04-21 DIAGNOSIS — D61810 Antineoplastic chemotherapy induced pancytopenia: Secondary | ICD-10-CM | POA: Diagnosis not present

## 2018-04-21 DIAGNOSIS — Z452 Encounter for adjustment and management of vascular access device: Secondary | ICD-10-CM | POA: Diagnosis not present

## 2018-04-21 NOTE — Telephone Encounter (Signed)
Thank you I have spoken with pharmacy

## 2018-04-21 NOTE — Telephone Encounter (Signed)
[facility] called today to inform Dr. Johnnye Sima patient is having a reaction to antibiotic Vancomycin. Will inform MD of call. Call back number: [place]

## 2018-04-21 NOTE — Patient Outreach (Signed)
[facility]) Care Management  04/21/2018  [name] 11-02-1940 [id number]     EMMI-General Discharge RED ON EMMI ALERT Day # 1 Date: 04/20/18 Red Alert Reason: " Taking meds? No"   Outreach attempt # 1 to patient.  Spoke with both patient and spouse. Patient states he is doing fairly well. He voices that he is still itching some and has rash. He is taking Benadryl prn. Patient shares how he had an allergic reaction to first antibiotic he was prescribed. Medication has been discontinued and he has been started on IV vancomycin. He has PICC line and [facility] administering med. Patient states [facility] coming around 11am today. Advised patient to have RN assess his rash to determine if it is improving. Patient voices that he went to infectious disease MD on yesterday and will follow up in 6 weeks. He has appt with PCP on 04/24/18. He denies any issues with transportation. Patient voices no other issues regarding his meds. He states no further RN CM needs or concerns at this time. Advised patient that they would get one more automated EMMI-GENERAL post discharge calls to assess how they are doing following recent hospitalization and will receive a call from a nurse if any of their responses were abnormal. Patient voiced understanding and was appreciative of f/u call.      Plan: RN CM will close case at this time.   Enzo Montgomery, RN,BSN,CCM [place] Telephonic Care Management Coordinator Direct Phone: [phone] Toll Free: [phone] Fax: [phone]

## 2018-04-21 NOTE — Telephone Encounter (Signed)
Called by pharmacy- pt having itching and rash with his IV vanco infusion, even with benadryl.  I have asked pharm to hold his rx until 9-9 and we can re-adresss.  I am not sure that his previous cefepime rxn had resolved prior to this infusion, still hopeful we can use vanco.

## 2018-04-22 ENCOUNTER — Emergency Department (HOSPITAL_COMMUNITY)
Admission: EM | Admit: 2018-04-22 | Discharge: 2018-04-22 | Disposition: A | Discharge status: Home / Self Care | Payer: Medicare Other | Attending: Emergency Medicine | Admitting: Emergency Medicine

## 2018-04-22 ENCOUNTER — Encounter (HOSPITAL_COMMUNITY): Payer: Self-pay

## 2018-04-22 DIAGNOSIS — T7840XA Allergy, unspecified, initial encounter: Secondary | ICD-10-CM

## 2018-04-22 DIAGNOSIS — R21 Rash and other nonspecific skin eruption: Secondary | ICD-10-CM | POA: Insufficient documentation

## 2018-04-22 DIAGNOSIS — Z79899 Other long term (current) drug therapy: Secondary | ICD-10-CM | POA: Insufficient documentation

## 2018-04-22 DIAGNOSIS — D090 Carcinoma in situ of bladder: Secondary | ICD-10-CM | POA: Insufficient documentation

## 2018-04-22 DIAGNOSIS — J449 Chronic obstructive pulmonary disease, unspecified: Secondary | ICD-10-CM | POA: Insufficient documentation

## 2018-04-22 DIAGNOSIS — Z87891 Personal history of nicotine dependence: Secondary | ICD-10-CM | POA: Diagnosis not present

## 2018-04-22 MED ORDER — PREDNISONE 20 MG PO TABS: 40.0000 mg | ORAL_TABLET | Freq: Every day | ORAL | 0 refills | Status: DC

## 2018-04-22 MED ORDER — HEPARIN SOD (PORK) LOCK FLUSH 100 UNIT/ML IV SOLN
500.0000 [IU] | Freq: Once | INTRAVENOUS | Status: AC
  Administered 2018-04-22: 500 [IU]
  Filled 2018-04-22: qty 5

## 2018-04-22 MED ORDER — DIPHENHYDRAMINE HCL 25 MG PO CAPS
25.0000 mg | ORAL_CAPSULE | Freq: Once | ORAL | Status: AC
  Administered 2018-04-22: 25 mg via ORAL
  Filled 2018-04-22: qty 1

## 2018-04-22 MED ORDER — FAMOTIDINE IN NACL 20-0.9 MG/50ML-% IV SOLN
20.0000 mg | Freq: Once | INTRAVENOUS | Status: AC
  Administered 2018-04-22: 20 mg via INTRAVENOUS
  Filled 2018-04-22: qty 50

## 2018-04-22 MED ORDER — METHYLPREDNISOLONE SODIUM SUCC 125 MG IJ SOLR
125.0000 mg | Freq: Once | INTRAMUSCULAR | Status: AC
  Administered 2018-04-22: 125 mg via INTRAVENOUS
  Filled 2018-04-22: qty 2

## 2018-04-22 NOTE — Discharge Instructions (Addendum)
Take the prescription as directed.  Take over the counter benadryl, as directed on packaging, as needed for itching.  If the benadryl is too sedating, take an over the counter non-sedating antihistamine such as claritin, allegra or zyrtec, as directed on packaging.  Call your regular Infectious Disease doctor on Monday to schedule a follow up appointment within the next 2 days and to receive further instructions regarding your antibiotics. Return to the Emergency Department immediately sooner if worsening.

## 2018-04-22 NOTE — ED Provider Notes (Signed)
[facility] EMERGENCY DEPARTMENT Provider Note   CSN: [id number] Arrival date & time: 04/22/18  8315     History   Chief Complaint Chief Complaint  Patient presents with  . Allergic Reaction    HPI [name] is a 77 y.o. male.   Allergic Reaction   Pt was seen at [facility].  Per pt and spouse, c/o gradual onset and persistence of constant "itching red rash" that began while he was receiving his IV vancomycin by his Home Health RN yesterday. Pt states he had this same reaction to IV cefepime on 04/19/2018. Pt was evaluated in the ED for that reaction, received benadryl, pepcid, and solumedrol with partial improvement. Pt was rx PO bactrim, but did not take the medication. Pt states he continued to have the reaction, though somewhat improved, over the next several days (pt was taking OTC benadryl). Pt states he was evaluated by his ID Dr. Johnnye Sima on 04/20/2018, had antibiotic changed to IV vancomycin. Pt received IV vancomycin yesterday and his itching, red rash worsened. Pt has taken OTC benadryl with "some" improvement. Denies SOB/wheezing, no dysphagia, no intra-oral edema, no CP, no abd pain, no N/V/D.   Past Medical History:  Diagnosis Date  . Bladder cancer ([place])   . BPH (benign prostatic hypertrophy)   . Dysuria   . GERD (gastroesophageal reflux disease)   . History of adenomatous polyp of colon    tubular adenoma 06/ 2018  . History of bladder cancer 12/2014   urologist-  dr [name]--  dx High Grade TCC without stromal invasion s/p TURBT and intravesical BCG tx/  recurrent 07/2015 (pTa) TCC  repear intravesical BCG tx   . History of hiatal hernia   . History of urethral stricture   . Nocturia     Patient Active Problem List   Diagnosis Date Noted  . Bacteremia associated with intravascular line ([place]) 04/20/2018  . Febrile neutropenia ([place]) 04/13/2018  . Gram positive sepsis ([place]) 04/13/2018  . SIRS (systemic inflammatory response syndrome) ([place]) 04/12/2018  . Bacteremia  due to Gram-positive bacteria 04/12/2018  . Pancytopenia ([place]) 04/12/2018  . Status post chemotherapy   . UTI (urinary tract infection) 04/11/2018  . Port-A-Cath in place 02/20/2018  . Encounter for antineoplastic chemotherapy 02/17/2018  . Sepsis due to urinary tract infection ([place]) 01/25/2018  . Sepsis secondary to UTI ([place]) 01/25/2018  . Hyperglycemia   . Goals of care, counseling/discussion 01/18/2018  . Bone metastasis ([place]) 12/29/2017  . Status post ileal conduit ([place]) 06/29/2017  . Bladder cancer ([place]) 06/28/2017  . Malignant neoplasm of urinary bladder ([place]) 04/20/2017  . COPD UNSPECIFIED 03/07/2008    Past Surgical History:  Procedure Laterality Date  . CARDIAC CATHETERIZATION  1997   normal coronary arteries  . CARDIOVASCULAR STRESS TEST  12-14-2005   Low risk perfusion study/  very small scar in the inferior septum from mid ventricle to apex,  no ischemia/  mild inferior septal hypokinesis/  ef 58%  . CATARACT EXTRACTION W/ INTRAOCULAR LENS  IMPLANT, BILATERAL  2011  . COLONOSCOPY  last one 06/ 2018  . CYSTOSCOPY WITH BIOPSY N/A 08/12/2015   Procedure: CYSTOSCOPY WITH BIOPSY AND FULGERATION;  Surgeon: [name], MD;  Location: [facility];  Service: Urology;  Laterality: N/A;  . CYSTOSCOPY WITH INJECTION N/A 06/29/2017   Procedure: CYSTOSCOPY WITH INJECTION OF INDOCYANINE GREEN DYE;  Surgeon: Alexis Frock, MD;  Location: [facility];  Service: Urology;  Laterality: N/A;  . CYSTOSCOPY WITH URETHRAL DILATATION N/A  03/21/2017   Procedure: CYSTOSCOPY;  Surgeon: [name], MD;  Location: [facility];  Service: Urology;  Laterality: N/A;  . ESOPHAGOGASTRODUODENOSCOPY  12-05-2014  . IR IMAGING GUIDED PORT INSERTION  02/07/2018  . IR REMOVAL TUN ACCESS W/ PORT W/O FL MOD SED  04/14/2018  . TRANSTHORACIC ECHOCARDIOGRAM  01-31-2008   nomral LVF,  ef 55-65%/  mild pulmonic stenosis without regurg.,  peak transpulomonic valve gradient  31mmHg  . TRANSURETHRAL RESECTION OF BLADDER TUMOR N/A 12/31/2014   Procedure: TRANSURETHRAL RESECTION OF BLADDER TUMOR (TURBT);  Surgeon: Lowella Bandy, MD;  Location: [facility];  Service: Urology;  Laterality: N/A;  . TRANSURETHRAL RESECTION OF BLADDER TUMOR N/A 03/21/2017   Procedure: POSSIBLE TRANSURETHRAL RESECTION OF BLADDER TUMOR (TURBT);  Surgeon: [name], MD;  Location: [facility];  Service: Urology;  Laterality: N/A;        Home Medications    Prior to Admission medications   Medication Sig Start Date End Date Taking? Authorizing Provider  acetaminophen (TYLENOL) 500 MG tablet Take 1,000 mg by mouth every 6 (six) hours as needed for fever.     [provider]  diphenhydrAMINE (BENADRYL) 25 MG tablet Take 25 mg by mouth every 6 (six) hours as needed for itching.    [provider]  feeding supplement, ENSURE ENLIVE, (ENSURE ENLIVE) LIQD Take 237 mLs by mouth 2 (two) times daily between meals. 04/18/18   Orson Eva, MD  HYDROmorphone (DILAUDID) 4 MG tablet Take 1 tablet (4 mg total) by mouth every 4 (four) hours as needed for severe pain. 04/03/18   [name], MD  hydrOXYzine (ATARAX/VISTARIL) 50 MG tablet Take 1 tablet (50 mg total) by mouth 4 (four) times daily as needed. Patient taking differently: Take 50 mg by mouth 4 (four) times daily as needed for itching.  04/03/18   [name], MD  lactose free nutrition (BOOST) LIQD Take 237 mLs by mouth 3 (three) times daily between meals.    [provider]  oxyCODONE (OXY IR/ROXICODONE) 5 MG immediate release tablet Take 1-2 tablets (5-10 mg total) by mouth every 4 (four) hours as needed for severe pain. 04/03/18   [name], MD  polyethylene glycol (MIRALAX / GLYCOLAX) packet Take 17 g by mouth daily. 04/18/18   Orson Eva, MD  prochlorperazine (COMPAZINE) 10 MG tablet Take 1 tablet (10 mg total) by mouth every 6 (six) hours as needed for nausea or vomiting.  01/18/18   [name], MD    Family History Family History  Problem Relation Age of Onset  . Alcoholism Father   . Colon cancer Neg Hx   . Colon polyps Neg Hx   . Diabetes Neg Hx   . Kidney disease Neg Hx   . Esophageal cancer Neg Hx   . Heart disease Neg Hx   . Gallbladder disease Neg Hx     Social History Social History   Tobacco Use  . Smoking status: Former Smoker    Packs/day: 1.00    Years: 34.00    Pack years: 34.00    Types: Cigarettes    Last attempt to quit: 12/26/1988    Years since quitting: 29.3  . Smokeless tobacco: Never Used  Substance Use Topics  . Alcohol use: Yes    Alcohol/week: 6.0 standard drinks    Types: 6 Cans of beer per week    Comment: BEER  . Drug use: No     Allergies  Ciprofloxacin and Cefepime   Review of Systems Review of Systems ROS: Statement: All systems negative except as marked or noted in the HPI; Constitutional: Negative for fever and chills. ; ; Eyes: Negative for eye pain, redness and discharge. ; ; ENMT: Negative for ear pain, hoarseness, nasal congestion, sinus pressure and sore throat. ; ; Cardiovascular: Negative for chest pain, palpitations, diaphoresis, dyspnea and peripheral edema. ; ; Respiratory: Negative for cough, wheezing and stridor. ; ; Gastrointestinal: Negative for nausea, vomiting, diarrhea, abdominal pain, blood in stool, hematemesis, jaundice and rectal bleeding. . ; ; Genitourinary: Negative for dysuria, flank pain and hematuria. ; ; Musculoskeletal: Negative for back pain and neck pain. Negative for swelling and trauma.; ; Skin: +itching rash. Negative for abrasions, blisters, bruising and skin lesion.; ; Neuro: Negative for headache, lightheadedness and neck stiffness. Negative for weakness, altered level of consciousness, altered mental status, extremity weakness, paresthesias, involuntary movement, seizure and syncope.       Physical Exam Updated Vital Signs BP (!) 167/84 (BP Location: Left Arm)    Pulse 92   Temp (!) 97.5 F (36.4 C) (Oral)   Resp 18   Ht 5\' 11"  (1.803 m)   Wt 81.6 kg   SpO2 100%   BMI 25.10 kg/m   Physical Exam 0750; Physical examination:  Nursing notes reviewed; Vital signs and O2 SAT reviewed;  Constitutional: Well developed, Well nourished, Well hydrated, In no acute distress; Head:  Normocephalic, atraumatic; Eyes: EOMI, PERRL, No scleral icterus; ENMT: Mouth and pharynx normal, Mucous membranes moist. Mouth and pharynx without lesions. No tonsillar exudates. No intra-oral edema. No submandibular or sublingual edema. No hoarse voice, no drooling, no stridor. No trismus.; Neck: Supple, Full range of motion, No lymphadenopathy; Cardiovascular: Regular rate and rhythm, No gallop; Respiratory: Breath sounds clear & equal bilaterally, No wheezes.  Speaking full sentences with ease, Normal respiratory effort/excursion; Chest: Nontender, Movement normal; Abdomen: Soft, Nontender, Nondistended, Normal bowel sounds; Genitourinary: No CVA tenderness; Extremities: Peripheral pulses normal, No tenderness, No edema, No calf edema or asymmetry.; Neuro: AA&Ox3, Major CN grossly intact.  Speech clear. No gross focal motor or sensory deficits in extremities.; Skin: Color normal, Warm, Dry. +diffuse erythematous macular rash to anterior/posterior torso that pt is scratching at during exam. PICC site LUE without erythema/rash. No petechiae, no purpura, no vesicles. No open wounds.      ED Treatments / Results  Labs (all labs ordered are listed, but only abnormal results are displayed)   EKG None  Radiology   Procedures Procedures (including critical care time)  Medications Ordered in ED Medications - No data to display   Initial Impression / Assessment and Plan / ED Course  I have reviewed the triage vital signs and the nursing notes.  Pertinent labs & imaging results that were available during my care of the patient were reviewed by me and considered in my medical  decision making (see chart for details).  MDM Reviewed: previous chart, nursing note and vitals    0800:  Unclear if reaction yesterday was to new med, or continuation/flair of 1st med reaction. Also to consider, since no reaction to med during hospitalization, ?reaction to different equipment used by [facility] service.  Pt to receive benadryl, pepcid, solumedrol while in the ED. Pt will need rx short course prednisone. T/C returned from [facility] ID Dr. Johnnye Sima, case discussed, including:  HPI, pertinent PM/SHx, VS/PE, dx testing, ED course and treatment:  Agrees with ED treatment, OK with rx prednisone, he will speak  with Home Health staff Monday regarding medication, then pt will be contacted/notified of plan for abx. Dx and d/w ID MD, d/w pt and family.  Questions answered.  Verb understanding, agreeable with plan.   9584:  Feels better after meds and wants to go home now. Pt's wife with multiple questions/concerns/comments regarding pt's abx. I explained that she should address these concerns with Home Health staff and ID MD. She verb understanding.     Final Clinical Impressions(s) / ED Diagnoses   Final diagnoses:  None    ED Discharge Orders    None       Francine Graven, DO 04/25/18 4171

## 2018-04-22 NOTE — ED Triage Notes (Signed)
Pt is on Vancomycin at home for "blood infection" and states he continues to have rash and itching despite a change in antibiotics.  Pt was previously on Cefipime with the same complaints.  Pt took 25 mg benadryl approx 4 hours ago.

## 2018-04-24 ENCOUNTER — Other Ambulatory Visit: Payer: Medicare Other

## 2018-04-24 ENCOUNTER — Ambulatory Visit: Payer: Medicare Other | Admitting: Oncology

## 2018-04-24 ENCOUNTER — Ambulatory Visit: Payer: Medicare Other

## 2018-04-24 ENCOUNTER — Other Ambulatory Visit: Payer: Self-pay

## 2018-04-24 DIAGNOSIS — I10 Essential (primary) hypertension: Secondary | ICD-10-CM | POA: Diagnosis not present

## 2018-04-24 DIAGNOSIS — K219 Gastro-esophageal reflux disease without esophagitis: Secondary | ICD-10-CM | POA: Diagnosis not present

## 2018-04-24 DIAGNOSIS — E663 Overweight: Secondary | ICD-10-CM | POA: Diagnosis not present

## 2018-04-24 DIAGNOSIS — B9689 Other specified bacterial agents as the cause of diseases classified elsewhere: Secondary | ICD-10-CM | POA: Diagnosis not present

## 2018-04-24 DIAGNOSIS — G894 Chronic pain syndrome: Secondary | ICD-10-CM | POA: Diagnosis not present

## 2018-04-24 DIAGNOSIS — C679 Malignant neoplasm of bladder, unspecified: Secondary | ICD-10-CM | POA: Diagnosis not present

## 2018-04-24 DIAGNOSIS — J449 Chronic obstructive pulmonary disease, unspecified: Secondary | ICD-10-CM | POA: Diagnosis not present

## 2018-04-24 DIAGNOSIS — Z0001 Encounter for general adult medical examination with abnormal findings: Secondary | ICD-10-CM | POA: Diagnosis not present

## 2018-04-24 DIAGNOSIS — N529 Male erectile dysfunction, unspecified: Secondary | ICD-10-CM | POA: Diagnosis not present

## 2018-04-24 DIAGNOSIS — Z1389 Encounter for screening for other disorder: Secondary | ICD-10-CM | POA: Diagnosis not present

## 2018-04-24 DIAGNOSIS — Z452 Encounter for adjustment and management of vascular access device: Secondary | ICD-10-CM | POA: Diagnosis not present

## 2018-04-24 DIAGNOSIS — Z5181 Encounter for therapeutic drug level monitoring: Secondary | ICD-10-CM | POA: Diagnosis not present

## 2018-04-24 DIAGNOSIS — Z6826 Body mass index (BMI) 26.0-26.9, adult: Secondary | ICD-10-CM | POA: Diagnosis not present

## 2018-04-24 DIAGNOSIS — C7951 Secondary malignant neoplasm of bone: Secondary | ICD-10-CM | POA: Diagnosis not present

## 2018-04-24 DIAGNOSIS — D61810 Antineoplastic chemotherapy induced pancytopenia: Secondary | ICD-10-CM | POA: Diagnosis not present

## 2018-04-24 DIAGNOSIS — N390 Urinary tract infection, site not specified: Secondary | ICD-10-CM | POA: Diagnosis not present

## 2018-04-24 DIAGNOSIS — R7881 Bacteremia: Secondary | ICD-10-CM | POA: Diagnosis not present

## 2018-04-24 NOTE — Telephone Encounter (Signed)
Patient's daughter called today asking what the plan for IV antibiotics will be.  She states Mr. Dragan had an allergic reaction to Vancomycin 04-21-2018. Visited the ER and was told to speak to Dr. Johnnye Sima for a follow up plan.  Will give them a call back with the answer. Pricilla Riffle RN]

## 2018-04-24 NOTE — Patient Outreach (Signed)
[facility]) Care Management  04/24/2018  [name] [birth year] [id number]     EMMI-General Discharge RED ON EMMI ALERT Day # 4 Date: 04/23/18 Red Alert Reason: " Talked to care team about dosing? No"    Outreach attempt # 1 to patient. Spoke with patient. He reports he is doing fairly well. Reviewed and addressed red alert. Patient shares that first antibiotic caused allergic rash reaction. Last week he was switched to IV vancomycin and states he developed the same reaction and had to go to the ED. He reports that since then he has not had any antibiotics as med has been on hold. He goes for PCP follow up appt today. He also voices that [facility] coming out to visit him today as well. Patient concerned about currently not being on any antibiotics at all. He inquired about the plan as to when he will start back on antibiotic. RN CM encouraged patient to discuss this with his medical team. No further RN CM needs or concerns at this time. Patient has completed post discharge automated EMMI calls.         Plan: RN CM will close case at this time.    Enzo Montgomery, RN,BSN,CCM [place] Management Telephonic Care Management Coordinator Direct Phone: [phone] Toll Free: [phone] Fax: [phone]

## 2018-04-25 ENCOUNTER — Telehealth: Payer: Self-pay | Admitting: Infectious Diseases

## 2018-04-25 DIAGNOSIS — D61810 Antineoplastic chemotherapy induced pancytopenia: Secondary | ICD-10-CM | POA: Diagnosis not present

## 2018-04-25 DIAGNOSIS — R7881 Bacteremia: Secondary | ICD-10-CM | POA: Diagnosis not present

## 2018-04-25 DIAGNOSIS — Z452 Encounter for adjustment and management of vascular access device: Secondary | ICD-10-CM | POA: Diagnosis not present

## 2018-04-25 DIAGNOSIS — B9689 Other specified bacterial agents as the cause of diseases classified elsewhere: Secondary | ICD-10-CM | POA: Diagnosis not present

## 2018-04-25 DIAGNOSIS — C7951 Secondary malignant neoplasm of bone: Secondary | ICD-10-CM | POA: Diagnosis not present

## 2018-04-25 DIAGNOSIS — C679 Malignant neoplasm of bladder, unspecified: Secondary | ICD-10-CM | POA: Diagnosis not present

## 2018-04-25 NOTE — Telephone Encounter (Signed)
Patient's PCP will not sign home health orders. Mary at [facility] asked if Dr Johnnye Sima would be willing to sign plan of care.  OK per Dr Johnnye Sima. Landis Gandy, RN

## 2018-04-25 NOTE — Telephone Encounter (Signed)
Called pt.  He is currently getting vanco infusion.  He took benadryl po prior.  He is on a prednisone taper, worried how he will react when this ends. I let them know that he cannot be on long term prednisone.  They will call his PCP about his low K+ 3.2 So far no problems.

## 2018-04-28 ENCOUNTER — Other Ambulatory Visit (HOSPITAL_COMMUNITY)
Admission: RE | Admit: 2018-04-28 | Discharge: 2018-04-28 | Disposition: A | Discharge status: Home / Self Care | Payer: Medicare Other | Source: Other Acute Inpatient Hospital | Attending: Infectious Diseases | Admitting: Infectious Diseases

## 2018-04-28 DIAGNOSIS — C7951 Secondary malignant neoplasm of bone: Secondary | ICD-10-CM | POA: Diagnosis not present

## 2018-04-28 DIAGNOSIS — Z5181 Encounter for therapeutic drug level monitoring: Secondary | ICD-10-CM | POA: Insufficient documentation

## 2018-04-28 DIAGNOSIS — D61810 Antineoplastic chemotherapy induced pancytopenia: Secondary | ICD-10-CM | POA: Diagnosis not present

## 2018-04-28 DIAGNOSIS — C679 Malignant neoplasm of bladder, unspecified: Secondary | ICD-10-CM | POA: Diagnosis not present

## 2018-04-28 DIAGNOSIS — R7881 Bacteremia: Secondary | ICD-10-CM | POA: Diagnosis not present

## 2018-04-28 DIAGNOSIS — Z452 Encounter for adjustment and management of vascular access device: Secondary | ICD-10-CM | POA: Diagnosis not present

## 2018-04-28 DIAGNOSIS — B9689 Other specified bacterial agents as the cause of diseases classified elsewhere: Secondary | ICD-10-CM | POA: Diagnosis not present

## 2018-04-28 LAB — VANCOMYCIN, TROUGH: Vancomycin Tr: 7 ug/mL — ABNORMAL LOW (ref 15–20)

## 2018-05-01 ENCOUNTER — Inpatient Hospital Stay (HOSPITAL_BASED_OUTPATIENT_CLINIC_OR_DEPARTMENT_OTHER): Payer: Medicare Other | Admitting: Oncology

## 2018-05-01 ENCOUNTER — Inpatient Hospital Stay: Payer: Medicare Other

## 2018-05-01 ENCOUNTER — Telehealth: Payer: Self-pay | Admitting: Behavioral Health

## 2018-05-01 ENCOUNTER — Inpatient Hospital Stay: Payer: Medicare Other | Attending: Oncology

## 2018-05-01 ENCOUNTER — Telehealth: Payer: Self-pay

## 2018-05-01 VITALS — BP 141/95 | HR 98 | Temp 98.3°F | Resp 18 | Ht 71.0 in | Wt 184.7 lb

## 2018-05-01 DIAGNOSIS — Z923 Personal history of irradiation: Secondary | ICD-10-CM

## 2018-05-01 DIAGNOSIS — R21 Rash and other nonspecific skin eruption: Secondary | ICD-10-CM | POA: Insufficient documentation

## 2018-05-01 DIAGNOSIS — C7800 Secondary malignant neoplasm of unspecified lung: Secondary | ICD-10-CM | POA: Diagnosis not present

## 2018-05-01 DIAGNOSIS — B9689 Other specified bacterial agents as the cause of diseases classified elsewhere: Secondary | ICD-10-CM | POA: Diagnosis not present

## 2018-05-01 DIAGNOSIS — C679 Malignant neoplasm of bladder, unspecified: Secondary | ICD-10-CM

## 2018-05-01 DIAGNOSIS — R7881 Bacteremia: Secondary | ICD-10-CM | POA: Insufficient documentation

## 2018-05-01 DIAGNOSIS — Z5111 Encounter for antineoplastic chemotherapy: Secondary | ICD-10-CM | POA: Diagnosis not present

## 2018-05-01 DIAGNOSIS — Z452 Encounter for adjustment and management of vascular access device: Secondary | ICD-10-CM | POA: Diagnosis not present

## 2018-05-01 DIAGNOSIS — D649 Anemia, unspecified: Secondary | ICD-10-CM | POA: Insufficient documentation

## 2018-05-01 DIAGNOSIS — Z9221 Personal history of antineoplastic chemotherapy: Secondary | ICD-10-CM | POA: Insufficient documentation

## 2018-05-01 DIAGNOSIS — Z95828 Presence of other vascular implants and grafts: Secondary | ICD-10-CM

## 2018-05-01 DIAGNOSIS — D61810 Antineoplastic chemotherapy induced pancytopenia: Secondary | ICD-10-CM | POA: Diagnosis not present

## 2018-05-01 DIAGNOSIS — C7951 Secondary malignant neoplasm of bone: Secondary | ICD-10-CM | POA: Diagnosis not present

## 2018-05-01 LAB — CMP (CANCER CENTER ONLY)
ALT: 23 U/L (ref 0–44)
AST: 17 U/L (ref 15–41)
Albumin: 2.9 g/dL — ABNORMAL LOW (ref 3.5–5.0)
Alkaline Phosphatase: 62 U/L (ref 38–126)
Anion gap: 9 (ref 5–15)
BUN: 15 mg/dL (ref 8–23)
CO2: 27 mmol/L (ref 22–32)
Calcium: 9.4 mg/dL (ref 8.9–10.3)
Chloride: 105 mmol/L (ref 98–111)
Creatinine: 0.8 mg/dL (ref 0.61–1.24)
GFR, Est AFR Am: >60 mL/min (ref >60)
GFR, Estimated: >60 mL/min (ref >60)
Glucose, Bld: 111 mg/dL — ABNORMAL HIGH (ref 70–99)
Potassium: 3.9 mmol/L (ref 3.5–5.1)
Sodium: 141 mmol/L (ref 135–145)
Total Bilirubin: 0.4 mg/dL (ref 0.3–1.2)
Total Protein: 5.7 g/dL — ABNORMAL LOW (ref 6.5–8.1)

## 2018-05-01 LAB — CBC WITH DIFFERENTIAL (CANCER CENTER ONLY)
Basophils Absolute: 0.1 10*3/uL (ref 0.0–0.1)
Basophils Relative: 1 %
Eosinophils Absolute: 0.4 10*3/uL (ref 0.0–0.5)
Eosinophils Relative: 7 %
HCT: 38.6 % (ref 38.4–49.9)
Hemoglobin: 12.7 g/dL — ABNORMAL LOW (ref 13.0–17.1)
Lymphocytes Relative: 11 %
Lymphs Abs: 0.6 10*3/uL — ABNORMAL LOW (ref 0.9–3.3)
MCH: 33.2 pg (ref 27.2–33.4)
MCHC: 33 g/dL (ref 32.0–36.0)
MCV: 100.6 fL — ABNORMAL HIGH (ref 79.3–98.0)
Monocytes Absolute: 1.3 10*3/uL — ABNORMAL HIGH (ref 0.1–0.9)
Monocytes Relative: 22 %
Neutro Abs: 3.4 10*3/uL (ref 1.5–6.5)
Neutrophils Relative %: 59 %
Platelet Count: 254 10*3/uL (ref 140–400)
RBC: 3.84 MIL/uL — ABNORMAL LOW (ref 4.20–5.82)
RDW: 25 % — ABNORMAL HIGH (ref 11.0–14.6)
WBC Count: 5.7 10*3/uL (ref 4.0–10.3)

## 2018-05-01 MED ORDER — SODIUM CHLORIDE 0.9% FLUSH
10.0000 mL | Freq: Once | INTRAVENOUS | Status: AC
  Administered 2018-05-01: 10 mL
  Filled 2018-05-01: qty 10

## 2018-05-01 MED ORDER — DIPHENHYDRAMINE HCL 50 MG/ML IJ SOLN
INTRAMUSCULAR | Status: AC
  Filled 2018-05-01: qty 1

## 2018-05-01 MED ORDER — SODIUM CHLORIDE 0.9 % IV SOLN
Freq: Once | INTRAVENOUS | Status: AC
  Administered 2018-05-01: 11:00:00 via INTRAVENOUS
  Filled 2018-05-01: qty 5

## 2018-05-01 MED ORDER — HEPARIN SOD (PORK) LOCK FLUSH 100 UNIT/ML IV SOLN
500.0000 [IU] | Freq: Once | INTRAVENOUS | Status: AC
  Administered 2018-05-01: 500 [IU]
  Filled 2018-05-01: qty 5

## 2018-05-01 MED ORDER — DIPHENHYDRAMINE HCL 50 MG/ML IJ SOLN
12.5000 mg | Freq: Once | INTRAMUSCULAR | Status: AC
  Administered 2018-05-01: 12.5 mg via INTRAVENOUS

## 2018-05-01 MED ORDER — HEPARIN SOD (PORK) LOCK FLUSH 100 UNIT/ML IV SOLN
250.0000 [IU] | Freq: Once | INTRAVENOUS | Status: AC | PRN
  Administered 2018-05-01: 250 [IU]
  Filled 2018-05-01: qty 5

## 2018-05-01 MED ORDER — FAMOTIDINE IN NACL 20-0.9 MG/50ML-% IV SOLN
INTRAVENOUS | Status: AC
  Filled 2018-05-01: qty 50

## 2018-05-01 MED ORDER — DEXAMETHASONE SODIUM PHOSPHATE 10 MG/ML IJ SOLN
10.0000 mg | Freq: Once | INTRAMUSCULAR | Status: AC
  Administered 2018-05-01: 10 mg via INTRAVENOUS

## 2018-05-01 MED ORDER — DEXAMETHASONE SODIUM PHOSPHATE 10 MG/ML IJ SOLN
INTRAMUSCULAR | Status: AC
  Filled 2018-05-01: qty 1

## 2018-05-01 MED ORDER — SODIUM CHLORIDE 0.9 % IV SOLN: 10.0000 mg | Freq: Once | INTRAVENOUS | Status: DC

## 2018-05-01 MED ORDER — SODIUM CHLORIDE 0.9 % IV SOLN
410.0000 mg | Freq: Once | INTRAVENOUS | Status: AC
  Administered 2018-05-01: 410 mg via INTRAVENOUS
  Filled 2018-05-01: qty 41

## 2018-05-01 MED ORDER — PALONOSETRON HCL INJECTION 0.25 MG/5ML
INTRAVENOUS | Status: AC
  Filled 2018-05-01: qty 5

## 2018-05-01 MED ORDER — SODIUM CHLORIDE 0.9 % IV SOLN
800.0000 mg/m2 | Freq: Once | INTRAVENOUS | Status: AC
  Administered 2018-05-01: 1710 mg via INTRAVENOUS
  Filled 2018-05-01: qty 44.97

## 2018-05-01 MED ORDER — FAMOTIDINE IN NACL 20-0.9 MG/50ML-% IV SOLN
20.0000 mg | Freq: Once | INTRAVENOUS | Status: AC
  Administered 2018-05-01: 20 mg via INTRAVENOUS

## 2018-05-01 MED ORDER — SODIUM CHLORIDE 0.9 % IV SOLN
Freq: Once | INTRAVENOUS | Status: AC
  Administered 2018-05-01: 10:00:00 via INTRAVENOUS
  Filled 2018-05-01: qty 250

## 2018-05-01 MED ORDER — SODIUM CHLORIDE 0.9% FLUSH
3.0000 mL | INTRAVENOUS | Status: DC | PRN
  Administered 2018-05-01: 3 mL
  Filled 2018-05-01: qty 10

## 2018-05-01 MED ORDER — PALONOSETRON HCL INJECTION 0.25 MG/5ML
0.2500 mg | Freq: Once | INTRAVENOUS | Status: AC
  Administered 2018-05-01: 0.25 mg via INTRAVENOUS

## 2018-05-01 NOTE — Patient Instructions (Signed)
[facility] Discharge Instructions for Patients Receiving Chemotherapy  Today you received the following chemotherapy agents Gemzar/Carboplatin To help prevent nausea and vomiting after your treatment, we encourage you to take your nausea medication as prescribed.   If you develop nausea and vomiting that is not controlled by your nausea medication, call the clinic.   BELOW ARE SYMPTOMS THAT SHOULD BE REPORTED IMMEDIATELY:  *FEVER GREATER THAN 100.5 F  *CHILLS WITH OR WITHOUT FEVER  NAUSEA AND VOMITING THAT IS NOT CONTROLLED WITH YOUR NAUSEA MEDICATION  *UNUSUAL SHORTNESS OF BREATH  *UNUSUAL BRUISING OR BLEEDING  TENDERNESS IN MOUTH AND THROAT WITH OR WITHOUT PRESENCE OF ULCERS  *URINARY PROBLEMS  *BOWEL PROBLEMS  UNUSUAL RASH Items with * indicate a potential emergency and should be followed up as soon as possible.  Feel free to call the clinic should you have any questions or concerns. The clinic phone number is [phone].  Please show the CHEMO ALERT CARD at check-in to the Emergency Department and triage nurse.   

## 2018-05-01 NOTE — Progress Notes (Signed)
Pt to receive Dex 10mg  IVP in addition to the Dex 12mg  combined with Emend. Pt having rash likely from antibiotics. Confirmed with Dr. Alen Blew.  Hardie Pulley, PharmD, BCPS, BCOP

## 2018-05-01 NOTE — Telephone Encounter (Signed)
Printed avs and calender of upcoming appointment. Per 9/16 los 

## 2018-05-01 NOTE — Telephone Encounter (Signed)
Patient's wife Peak called stating [name] has again had another reaction to IV Vancomycin.  She states he started back infusions Tuesday 04/25/2018 no reaction, Wednesday 04/26/2018 no reaction and on Thursday 04/27/2018 as the infusion was going he began to develop hives, itching and redness on his upper body (Abdomen, chest, back and bilateral arms.)  Infusion was stopped by infusion nurse and told to inform doctor Hatcher.  They were calling in today to inform Dr. Johnnye Sima of the reaction. Pricilla Riffle RN

## 2018-05-01 NOTE — Progress Notes (Signed)
Hematology and Oncology Follow Up Visit  [name] [id number] 09-22-1940 77 y.o. 05/01/2018 9:20 AM [name], MDFusco, Purcell Nails, MD   Principle Diagnosis: 77 year old man with stage IV bladder cancer with documented disease to the lung and pelvic metastasis in 2019.  His tumor is PDL 1 positive  with CPS score over 90%.   Prior Therapy:   He is status post neoadjuvant chemotherapy utilizing gemcitabine and cisplatin started in September 2018.  He received day 1 of cycle 1 on 05/02/2017 and therapy discontinued after that.  He is status post laparoscopic Cystoprostatectomy and bilateral lymphadenectomy done by Dr. Tresa Moore on 06/29/2017. Final pathology showed a T3bN0 disease.  He has 13 lymph nodes sampled without any evidence of malignancy.  He completed radiation therapy to the pelvis on January 16, 2018 under the care of Dr. Tammi Klippel.  Current therapy: Carboplatin and gemcitabine he is here for day 1 of cycle 5.   Interim History: [name] presents today for a follow-up with his family.  Since the last visit, he was hospitalized at [facility] on April 10, 2018 after presenting with fevers.  He developed bacteremia and his work-up revealed a vegetation on a transthoracic echo.  The organism was Rothia mucilagnosa and was started on intravenous antibiotics.  He initially received cefepime and subsequently vancomycin under the care of Dr. Johnnye Sima.  He developed rash related to vancomycin as well as pruritus.  He denies any fevers, chills or sweats.  His appetite is excellent and his performance status is maintained.  He denies any hip pain at this time.  He is ambulating without any major difficulties.  He does not report any headaches, blurry vision, syncope or seizures.  He denies any alteration in mental status or confusion.  He does not report any fevers, chills or sweats. He does not report any cough, wheezing or dyspnea on exertion. He does not report any chest pain,  palpitation, orthopnea or leg edema. He does not report any nausea, vomiting.  He denies any changes in his bowel habits.  He denied hematochezia or melena.  He does not report any frequency urgency or hesitancy.Marland Kitchen  He denies any bleeding or clotting tendencies.  Denies any in his mood.  Remaining review of systems is negative.  Medications: I have reviewed the patient's current medications.  Current Outpatient Medications  Medication Sig Dispense Refill  . acetaminophen (TYLENOL) 500 MG tablet Take 1,000 mg by mouth every 6 (six) hours as needed for fever.     . diphenhydrAMINE (BENADRYL) 25 MG tablet Take 25 mg by mouth every 6 (six) hours as needed for itching.    . feeding supplement, ENSURE ENLIVE, (ENSURE ENLIVE) LIQD Take 237 mLs by mouth 2 (two) times daily between meals. 237 mL 12  . HYDROmorphone (DILAUDID) 4 MG tablet Take 1 tablet (4 mg total) by mouth every 4 (four) hours as needed for severe pain. 30 tablet 0  . hydrOXYzine (ATARAX/VISTARIL) 50 MG tablet Take 1 tablet (50 mg total) by mouth 4 (four) times daily as needed. (Patient taking differently: Take 50 mg by mouth 4 (four) times daily as needed for itching. ) 30 tablet 0  . lactose free nutrition (BOOST) LIQD Take 237 mLs by mouth 3 (three) times daily between meals.    Marland Kitchen oxyCODONE (OXY IR/ROXICODONE) 5 MG immediate release tablet Take 1-2 tablets (5-10 mg total) by mouth every 4 (four) hours as needed for severe pain. 60 tablet 0  . polyethylene glycol (MIRALAX / GLYCOLAX)  packet Take 17 g by mouth daily. 14 each 0  . predniSONE (DELTASONE) 20 MG tablet Take 2 tablets (40 mg total) by mouth daily. 10 tablet 0  . prochlorperazine (COMPAZINE) 10 MG tablet Take 1 tablet (10 mg total) by mouth every 6 (six) hours as needed for nausea or vomiting. 30 tablet 0   No current facility-administered medications for this visit.      Allergies:  Allergies  Allergen Reactions  . Ciprofloxacin Hives  . Cefepime Rash    Past Medical  History, Surgical history, Social history, and Family History were reviewed and updated.  Physical Exam:   Blood pressure (!) 141/95, pulse 98, temperature 98.3 F (36.8 C), temperature source Oral, resp. rate 18, height 5\' 11"  (1.803 m), weight 184 lb 11.2 oz (83.8 kg), SpO2 100 %.    ECOG: 1   General appearance: Comfortable appearing without any discomfort Head: Normocephalic without any trauma Oropharynx: Mucous membranes are moist and pink without any thrush or ulcers. Eyes: Pupils are equal and round reactive to light. Lymph nodes: No cervical, supraclavicular, inguinal or axillary lymphadenopathy.   Heart:regular rate and rhythm.  S1 and S2 without leg edema. Lung: Clear without any rhonchi or wheezes.  No dullness to percussion. Abdomin: Soft, nontender, nondistended with good bowel sounds.  No hepatosplenomegaly. Musculoskeletal: No joint deformity or effusion.  Full range of motion noted. Neurological: No deficits noted on motor, sensory and deep tendon reflex exam. Skin: Mild erythema noted on his chest wall and flank. Psychiatric: Mood and affect appeared appropriate.     Lab Results: Lab Results  Component Value Date   WBC 2.8 (L) 04/18/2018   HGB 8.8 (L) 04/18/2018   HCT 27.5 (L) 04/18/2018   MCV 94.2 04/18/2018   PLT 40 (L) 04/18/2018     Chemistry      Component Value Date/Time   NA 143 04/18/2018 0627   NA 141 05/23/2017 0828   K 3.5 04/18/2018 0627   K 4.0 05/23/2017 0828   CL 110 04/18/2018 0627   CO2 28 04/18/2018 0627   CO2 27 05/23/2017 0828   BUN 9 04/18/2018 0627   BUN 12.5 05/23/2017 0828   CREATININE 0.60 (L) 04/18/2018 0627   CREATININE 0.66 04/10/2018 0816   CREATININE 0.8 05/23/2017 0828      Component Value Date/Time   CALCIUM 8.6 (L) 04/18/2018 0627   CALCIUM 9.0 05/23/2017 0828   ALKPHOS 44 04/11/2018 0458   ALKPHOS 57 05/23/2017 0828   AST 26 04/11/2018 0458   AST 18 04/10/2018 0816   AST 13 05/23/2017 0828   ALT 29  04/11/2018 0458   ALT 33 04/10/2018 0816   ALT 15 05/23/2017 0828   BILITOT 0.5 04/11/2018 0458   BILITOT 0.5 04/10/2018 0816   BILITOT 0.37 05/23/2017 0828       Impression and Plan:  77 year old man with:   1.  Urothelial carcinoma arising from the bladder diagnosed in 2018.  He developed stage IV disease in 2019.  He completed 4 cycles of therapy with reasonable response both clinically and radiographically.  Risks and benefits of continuing this treatment as well as the natural course of this disease was reviewed.  Despite his recent hospitalization and bacteremia, he has fully recovered at this time and willing to proceed.  His laboratory data are adequate and the plan is to proceed today with cycle 5 and will eliminate day 8 of chemotherapy moving forward.  He will complete 6 cycles of chemotherapy and repeat  imaging studies after that.  2. Hip pain: Continues to improve with the treatment of his cancer.  He rarely takes any pain medication.  3.  Bacteremia: Likely from a urinary tract infection etiology.  He continues to be on long-term antibiotics and he has been reacting to vancomycin as of late.  Despite Benadryl and prednisone taper at this time.  He continues to follow with Dr. Johnnye Sima regarding this issue.  His Port-A-Cath has been removed and he currently using a PICC line.  4. Renal function: Creatinine on 04/18/2018 was within normal range.  5.  Goals of care: Treatments remains palliative but his performance status remains adequate for aggressive therapy.  6.  IV access: He has a PICC line in place and Port-A-Cath has been removed.  7.  Antiemetics: No issues related to chemotherapy at this time reported.  Antiemetics has been available to him.  8.  Rash: Related to recent antibiotic use.  Will receive dexamethasone prior to chemotherapy today.  9.  Anemia: Hemoglobin completely normalized at this time with a hemoglobin of 12.7.  No growth factor support is  needed.  10.  Follow-up: Will be in 3 weeks for cycle 6 of therapy.  25  minutes was spent with the patient face-to-face today.  More than 50% of time was dedicated to reviewing the natural course of this disease, discussing long-term complication as well as coordinating his plan of care.  Zola Button, MD 9/16/20199:20 AM

## 2018-05-02 ENCOUNTER — Telehealth: Payer: Self-pay | Admitting: *Deleted

## 2018-05-02 ENCOUNTER — Other Ambulatory Visit: Payer: Self-pay | Admitting: *Deleted

## 2018-05-02 DIAGNOSIS — C679 Malignant neoplasm of bladder, unspecified: Secondary | ICD-10-CM

## 2018-05-02 DIAGNOSIS — C7951 Secondary malignant neoplasm of bone: Secondary | ICD-10-CM

## 2018-05-02 MED ORDER — HYDROXYZINE HCL 50 MG PO TABS: 50.0000 mg | ORAL_TABLET | Freq: Four times a day (QID) | ORAL | 0 refills | Status: DC | PRN

## 2018-05-02 NOTE — Telephone Encounter (Signed)
Called Mr. Hitchman, informed him per Dr. Johnnye Sima to stop the Vancomycin.  Informed him that Dr. Johnnye Sima would gather more information and if rash is better on Thursday we can try oral Bactrim which was prescribed before.  Patient verbalized understanding.  He state he was prescribed Hydroxyzine 50mg  Q6hours prn for itching and patient states the itching is better and his rash is slowly going away. Pricilla Riffle RN

## 2018-05-02 NOTE — Telephone Encounter (Signed)
Spoke with Livia Snellen, daughter-in-law, regarding patient's rash and he has started itching again this morning. She stated,"can you please ask Dr. Alen Blew to give him something for the itching? He got Decadron yesterday with his chemotherapy. Prednisone stops the itch. My return number is [phone].

## 2018-05-02 NOTE — Telephone Encounter (Signed)
Thanks Caryl Pina They can stop the vanco,  I am posting this to an infectious disease list serve to get some advice.  If the rash is better on Thursday, then can try the oral bactrim that we had prescribed before Thanks jeff

## 2018-05-02 NOTE — Telephone Encounter (Signed)
Atarax 50 mg QID PRN. #30.

## 2018-05-04 ENCOUNTER — Other Ambulatory Visit: Payer: Self-pay | Admitting: *Deleted

## 2018-05-04 ENCOUNTER — Telehealth: Payer: Self-pay | Admitting: *Deleted

## 2018-05-04 DIAGNOSIS — R7881 Bacteremia: Secondary | ICD-10-CM | POA: Diagnosis not present

## 2018-05-04 DIAGNOSIS — Z452 Encounter for adjustment and management of vascular access device: Secondary | ICD-10-CM | POA: Diagnosis not present

## 2018-05-04 DIAGNOSIS — C679 Malignant neoplasm of bladder, unspecified: Secondary | ICD-10-CM | POA: Diagnosis not present

## 2018-05-04 DIAGNOSIS — D61810 Antineoplastic chemotherapy induced pancytopenia: Secondary | ICD-10-CM | POA: Diagnosis not present

## 2018-05-04 DIAGNOSIS — C7951 Secondary malignant neoplasm of bone: Secondary | ICD-10-CM | POA: Diagnosis not present

## 2018-05-04 DIAGNOSIS — B9689 Other specified bacterial agents as the cause of diseases classified elsewhere: Secondary | ICD-10-CM | POA: Diagnosis not present

## 2018-05-04 MED ORDER — METHYLPREDNISOLONE 4 MG PO TBPK: ORAL_TABLET | ORAL | 0 refills | Status: DC

## 2018-05-04 NOTE — Telephone Encounter (Signed)
Medrol dose pack. (21 4 mg tablets) Day 1: 24 mg/day in single or divided doses, Day 2: 20 mg/day in single or divided doses, Day 3: 16 mg/day in single or divided doses, Day 4: 12 mg/day in single or divided doses, Day 5: 8 mg/day in single or divided doses, Day 6: 4 mg in a single dose.

## 2018-05-04 NOTE — Telephone Encounter (Signed)
Informed patient that Dr. Alen Blew was E-scribing a Medrol dosepack for itching, hives and rash. He verbalized understanding.

## 2018-05-04 NOTE — Telephone Encounter (Signed)
Patient wife called advised patient woke up this morning and with a full body rash and sever itching. He had his last dose of IV antibiotics 04/27/18. He had Chemo on Monday 05/01/18. He is taking Atarax 50 mg every 6 hours and OTC Benadryl but still having discomfort. He would like to know what Dr Johnnye Sima recommends. He reports no tongue or throat swelling. Advised him will lat the provider know what is happening and give them a call back once he responds.  Advised in the mean time if he dose start to having trouble breathing he should call

## 2018-05-04 NOTE — Telephone Encounter (Signed)
Received voice mail message from patient stating,"I am covered in hives and whelps. The itching is really bad. I've tried Benadryl, Atarax and Prednisone. What else does Dr. Alen Blew recommend? I'm ready to go to the ER for some relief. Return number is [phone]."

## 2018-05-04 NOTE — Telephone Encounter (Signed)
He should stop the bactrim (if he started taking it). If he did not, he should call his oncologist to see if this is related to his chemo.  Can I see him in clinic tomorrow at 11am? I attempted to call him but got no answer.

## 2018-05-08 ENCOUNTER — Other Ambulatory Visit: Payer: Self-pay | Admitting: Behavioral Health

## 2018-05-08 DIAGNOSIS — C679 Malignant neoplasm of bladder, unspecified: Secondary | ICD-10-CM | POA: Diagnosis not present

## 2018-05-08 DIAGNOSIS — R7881 Bacteremia: Secondary | ICD-10-CM | POA: Diagnosis not present

## 2018-05-08 DIAGNOSIS — C7951 Secondary malignant neoplasm of bone: Secondary | ICD-10-CM | POA: Diagnosis not present

## 2018-05-08 DIAGNOSIS — D61810 Antineoplastic chemotherapy induced pancytopenia: Secondary | ICD-10-CM | POA: Diagnosis not present

## 2018-05-08 DIAGNOSIS — B9689 Other specified bacterial agents as the cause of diseases classified elsewhere: Secondary | ICD-10-CM | POA: Diagnosis not present

## 2018-05-08 DIAGNOSIS — Z452 Encounter for adjustment and management of vascular access device: Secondary | ICD-10-CM | POA: Diagnosis not present

## 2018-05-08 NOTE — Telephone Encounter (Signed)
Can you call [facility] and see if they can get him set up to start merrem 500mg  q8 hours IVPB He can stop the bactrim after he starts the [facility]

## 2018-05-08 NOTE — Telephone Encounter (Signed)
Called [facility] spoke to [employer] and gave verbal order per Dr. Johnnye Sima for patient to have Merrem 594m Q8 hours x5 weeks from start date. LJeani Hawkingverified order with read-back.  First dose will be given in the patient's home.  Aware patient has had an allergic reaction to several antibiotics so home nursing staff will be made aware and will take anaphylaxis kit.  Spoke to Dr HJohnnye Simaand he gave verbal phone order for 5 weeks from start date of the [place] RiffleRN

## 2018-05-09 DIAGNOSIS — C679 Malignant neoplasm of bladder, unspecified: Secondary | ICD-10-CM | POA: Diagnosis not present

## 2018-05-09 DIAGNOSIS — B9689 Other specified bacterial agents as the cause of diseases classified elsewhere: Secondary | ICD-10-CM | POA: Diagnosis not present

## 2018-05-09 DIAGNOSIS — R7881 Bacteremia: Secondary | ICD-10-CM | POA: Diagnosis not present

## 2018-05-09 DIAGNOSIS — D61810 Antineoplastic chemotherapy induced pancytopenia: Secondary | ICD-10-CM | POA: Diagnosis not present

## 2018-05-09 DIAGNOSIS — C7951 Secondary malignant neoplasm of bone: Secondary | ICD-10-CM | POA: Diagnosis not present

## 2018-05-09 DIAGNOSIS — Z452 Encounter for adjustment and management of vascular access device: Secondary | ICD-10-CM | POA: Diagnosis not present

## 2018-05-09 NOTE — Telephone Encounter (Signed)
Thanks Ashley

## 2018-05-10 DIAGNOSIS — B9689 Other specified bacterial agents as the cause of diseases classified elsewhere: Secondary | ICD-10-CM | POA: Diagnosis not present

## 2018-05-10 DIAGNOSIS — C7951 Secondary malignant neoplasm of bone: Secondary | ICD-10-CM | POA: Diagnosis not present

## 2018-05-10 DIAGNOSIS — C679 Malignant neoplasm of bladder, unspecified: Secondary | ICD-10-CM | POA: Diagnosis not present

## 2018-05-10 DIAGNOSIS — D61810 Antineoplastic chemotherapy induced pancytopenia: Secondary | ICD-10-CM | POA: Diagnosis not present

## 2018-05-10 DIAGNOSIS — R7881 Bacteremia: Secondary | ICD-10-CM | POA: Diagnosis not present

## 2018-05-10 DIAGNOSIS — Z452 Encounter for adjustment and management of vascular access device: Secondary | ICD-10-CM | POA: Diagnosis not present

## 2018-05-15 ENCOUNTER — Other Ambulatory Visit: Payer: Medicare Other

## 2018-05-15 ENCOUNTER — Encounter: Payer: Self-pay | Admitting: Infectious Diseases

## 2018-05-15 ENCOUNTER — Ambulatory Visit: Payer: Medicare Other | Admitting: Oncology

## 2018-05-15 ENCOUNTER — Ambulatory Visit: Payer: Medicare Other

## 2018-05-15 DIAGNOSIS — Z792 Long term (current) use of antibiotics: Secondary | ICD-10-CM | POA: Diagnosis not present

## 2018-05-15 DIAGNOSIS — C7951 Secondary malignant neoplasm of bone: Secondary | ICD-10-CM | POA: Diagnosis not present

## 2018-05-15 DIAGNOSIS — Z452 Encounter for adjustment and management of vascular access device: Secondary | ICD-10-CM | POA: Diagnosis not present

## 2018-05-15 DIAGNOSIS — R7881 Bacteremia: Secondary | ICD-10-CM | POA: Diagnosis not present

## 2018-05-15 DIAGNOSIS — C679 Malignant neoplasm of bladder, unspecified: Secondary | ICD-10-CM | POA: Diagnosis not present

## 2018-05-15 DIAGNOSIS — D61810 Antineoplastic chemotherapy induced pancytopenia: Secondary | ICD-10-CM | POA: Diagnosis not present

## 2018-05-15 DIAGNOSIS — B9689 Other specified bacterial agents as the cause of diseases classified elsewhere: Secondary | ICD-10-CM | POA: Diagnosis not present

## 2018-05-16 ENCOUNTER — Telehealth: Payer: Self-pay | Admitting: *Deleted

## 2018-05-16 DIAGNOSIS — Z452 Encounter for adjustment and management of vascular access device: Secondary | ICD-10-CM | POA: Diagnosis not present

## 2018-05-16 DIAGNOSIS — C7951 Secondary malignant neoplasm of bone: Secondary | ICD-10-CM | POA: Diagnosis not present

## 2018-05-16 DIAGNOSIS — D61810 Antineoplastic chemotherapy induced pancytopenia: Secondary | ICD-10-CM | POA: Diagnosis not present

## 2018-05-16 DIAGNOSIS — B9689 Other specified bacterial agents as the cause of diseases classified elsewhere: Secondary | ICD-10-CM | POA: Diagnosis not present

## 2018-05-16 DIAGNOSIS — R7881 Bacteremia: Secondary | ICD-10-CM | POA: Diagnosis not present

## 2018-05-16 DIAGNOSIS — C679 Malignant neoplasm of bladder, unspecified: Secondary | ICD-10-CM | POA: Diagnosis not present

## 2018-05-16 NOTE — Telephone Encounter (Signed)
Spoke with [facility] Stop merrem Continue benadryl and atarax prn ( if not effective, will try steroids pack) Resume his po antibiotics

## 2018-05-16 NOTE — Telephone Encounter (Signed)
Patient states he stopped antibiotics yesterday, but the hydroxyzine and benadryl are ineffective. He would like a steroid pack to help with his itching.   RN spoke with Lenna Sciara at [place]. She states he started the merrem 500 mg three times daily on 9/24. On 9/30, his home health nurse noted a new 1-2 cm red spot at the PICC insertion site and redness/itching on his trunk. Patient stopped merrem per [facility] pharmacy.  Lenna Sciara will page Dr Johnnye Sima for further advice/directions. Landis Gandy, RN

## 2018-05-16 NOTE — Telephone Encounter (Signed)
Per verbal order from Dr Johnnye Sima, verbal order confirmed with Lenna Sciara at [place] to stop merrem, resume oral antibioitcs, pull picc line, continue oral benadryl and atarax. Patient should call back after 24 hours if itching is not resolved. Patient verbalized understanding. He will call back tomorrow. Landis Gandy, RN

## 2018-05-16 NOTE — Telephone Encounter (Signed)
Received verbal order per Dr Johnnye Sima for patient to NOT resume bactrim. Dr Johnnye Sima will give oral antibiotic recommendation 10/2 after reviewing chart. Patient aware no antibiotics (iv or oral) until he hears from [place]. Landis Gandy, RN

## 2018-05-17 ENCOUNTER — Telehealth: Payer: Self-pay | Admitting: Infectious Diseases

## 2018-05-17 DIAGNOSIS — C7951 Secondary malignant neoplasm of bone: Secondary | ICD-10-CM

## 2018-05-17 DIAGNOSIS — C679 Malignant neoplasm of bladder, unspecified: Secondary | ICD-10-CM

## 2018-05-17 MED ORDER — METHYLPREDNISOLONE 4 MG PO TBPK: ORAL_TABLET | ORAL | 0 refills | Status: DC

## 2018-05-17 NOTE — Telephone Encounter (Signed)
I called [name] about his recent events.  He continues to have pruritis, states he was up all night.  He does not feel like the benadryl was helpful.  Will send him repeat steroid dose.  Will hold his anbx until further notice.  He is getting repeat chemo on Monday.   He will f/u in ID clinic and we will repeat his BCx.

## 2018-05-22 ENCOUNTER — Other Ambulatory Visit: Payer: Medicare Other

## 2018-05-22 ENCOUNTER — Inpatient Hospital Stay (HOSPITAL_BASED_OUTPATIENT_CLINIC_OR_DEPARTMENT_OTHER): Payer: Medicare Other | Admitting: Oncology

## 2018-05-22 ENCOUNTER — Telehealth: Payer: Self-pay | Admitting: Oncology

## 2018-05-22 ENCOUNTER — Inpatient Hospital Stay: Payer: Medicare Other

## 2018-05-22 ENCOUNTER — Inpatient Hospital Stay: Payer: Medicare Other | Attending: Oncology

## 2018-05-22 VITALS — BP 156/85 | HR 87 | Temp 98.4°F | Resp 18 | Ht 71.0 in | Wt 190.8 lb

## 2018-05-22 DIAGNOSIS — C7800 Secondary malignant neoplasm of unspecified lung: Secondary | ICD-10-CM | POA: Diagnosis not present

## 2018-05-22 DIAGNOSIS — C679 Malignant neoplasm of bladder, unspecified: Secondary | ICD-10-CM

## 2018-05-22 DIAGNOSIS — D649 Anemia, unspecified: Secondary | ICD-10-CM | POA: Diagnosis not present

## 2018-05-22 DIAGNOSIS — Z923 Personal history of irradiation: Secondary | ICD-10-CM | POA: Diagnosis not present

## 2018-05-22 DIAGNOSIS — Z5111 Encounter for antineoplastic chemotherapy: Secondary | ICD-10-CM | POA: Diagnosis not present

## 2018-05-22 DIAGNOSIS — R600 Localized edema: Secondary | ICD-10-CM | POA: Insufficient documentation

## 2018-05-22 DIAGNOSIS — G893 Neoplasm related pain (acute) (chronic): Secondary | ICD-10-CM | POA: Insufficient documentation

## 2018-05-22 DIAGNOSIS — C7951 Secondary malignant neoplasm of bone: Secondary | ICD-10-CM

## 2018-05-22 DIAGNOSIS — R7881 Bacteremia: Secondary | ICD-10-CM | POA: Diagnosis not present

## 2018-05-22 DIAGNOSIS — Z23 Encounter for immunization: Secondary | ICD-10-CM | POA: Diagnosis not present

## 2018-05-22 DIAGNOSIS — Z9221 Personal history of antineoplastic chemotherapy: Secondary | ICD-10-CM

## 2018-05-22 DIAGNOSIS — R21 Rash and other nonspecific skin eruption: Secondary | ICD-10-CM | POA: Insufficient documentation

## 2018-05-22 LAB — CBC WITH DIFFERENTIAL (CANCER CENTER ONLY)
Basophils Absolute: 0.1 10*3/uL (ref 0.0–0.1)
Basophils Relative: 1 %
Eosinophils Absolute: 0.1 10*3/uL (ref 0.0–0.5)
Eosinophils Relative: 1 %
HCT: 38.6 % (ref 38.4–49.9)
Hemoglobin: 12.9 g/dL — ABNORMAL LOW (ref 13.0–17.1)
Lymphocytes Relative: 9 %
Lymphs Abs: 0.7 10*3/uL — ABNORMAL LOW (ref 0.9–3.3)
MCH: 33.8 pg — ABNORMAL HIGH (ref 27.2–33.4)
MCHC: 33.4 g/dL (ref 32.0–36.0)
MCV: 101.3 fL — ABNORMAL HIGH (ref 79.3–98.0)
Monocytes Absolute: 1 10*3/uL — ABNORMAL HIGH (ref 0.1–0.9)
Monocytes Relative: 13 %
Neutro Abs: 6 10*3/uL (ref 1.5–6.5)
Neutrophils Relative %: 76 %
Platelet Count: 253 10*3/uL (ref 140–400)
RBC: 3.81 MIL/uL — ABNORMAL LOW (ref 4.20–5.82)
RDW: 21.5 % — ABNORMAL HIGH (ref 11.0–14.6)
WBC Count: 7.9 10*3/uL (ref 4.0–10.3)

## 2018-05-22 LAB — CMP (CANCER CENTER ONLY)
ALT: 37 U/L (ref 0–44)
AST: 20 U/L (ref 15–41)
Albumin: 2.7 g/dL — ABNORMAL LOW (ref 3.5–5.0)
Alkaline Phosphatase: 59 U/L (ref 38–126)
Anion gap: 8 (ref 5–15)
BUN: 12 mg/dL (ref 8–23)
CO2: 28 mmol/L (ref 22–32)
Calcium: 9.1 mg/dL (ref 8.9–10.3)
Chloride: 108 mmol/L (ref 98–111)
Creatinine: 0.74 mg/dL (ref 0.61–1.24)
GFR, Est AFR Am: >60 mL/min (ref >60)
GFR, Estimated: >60 mL/min (ref >60)
Glucose, Bld: 91 mg/dL (ref 70–99)
Potassium: 3.9 mmol/L (ref 3.5–5.1)
Sodium: 144 mmol/L (ref 135–145)
Total Bilirubin: 0.3 mg/dL (ref 0.3–1.2)
Total Protein: 5.2 g/dL — ABNORMAL LOW (ref 6.5–8.1)

## 2018-05-22 MED ORDER — SODIUM CHLORIDE 0.9 % IV SOLN
800.0000 mg/m2 | Freq: Once | INTRAVENOUS | Status: AC
  Administered 2018-05-22: 1710 mg via INTRAVENOUS
  Filled 2018-05-22: qty 44.97

## 2018-05-22 MED ORDER — DIPHENHYDRAMINE HCL 50 MG/ML IJ SOLN
12.5000 mg | Freq: Once | INTRAMUSCULAR | Status: AC
  Administered 2018-05-22: 12.5 mg via INTRAVENOUS

## 2018-05-22 MED ORDER — SODIUM CHLORIDE 0.9 % IV SOLN
Freq: Once | INTRAVENOUS | Status: AC
  Administered 2018-05-22: 12:00:00 via INTRAVENOUS
  Filled 2018-05-22: qty 5

## 2018-05-22 MED ORDER — PALONOSETRON HCL INJECTION 0.25 MG/5ML
0.2500 mg | Freq: Once | INTRAVENOUS | Status: AC
  Administered 2018-05-22: 0.25 mg via INTRAVENOUS

## 2018-05-22 MED ORDER — DIPHENHYDRAMINE HCL 50 MG/ML IJ SOLN
INTRAMUSCULAR | Status: AC
  Filled 2018-05-22: qty 1

## 2018-05-22 MED ORDER — FAMOTIDINE IN NACL 20-0.9 MG/50ML-% IV SOLN
INTRAVENOUS | Status: AC
  Filled 2018-05-22: qty 50

## 2018-05-22 MED ORDER — INFLUENZA VAC SPLIT QUAD 0.5 ML IM SUSY
0.5000 mL | PREFILLED_SYRINGE | Freq: Once | INTRAMUSCULAR | Status: AC
  Administered 2018-05-22: 0.5 mL via INTRAMUSCULAR

## 2018-05-22 MED ORDER — SODIUM CHLORIDE 0.9 % IV SOLN
410.0000 mg | Freq: Once | INTRAVENOUS | Status: AC
  Administered 2018-05-22: 410 mg via INTRAVENOUS
  Filled 2018-05-22: qty 41

## 2018-05-22 MED ORDER — SODIUM CHLORIDE 0.9 % IV SOLN
Freq: Once | INTRAVENOUS | Status: AC
  Administered 2018-05-22: 11:00:00 via INTRAVENOUS
  Filled 2018-05-22: qty 250

## 2018-05-22 MED ORDER — FAMOTIDINE IN NACL 20-0.9 MG/50ML-% IV SOLN
20.0000 mg | Freq: Once | INTRAVENOUS | Status: AC
  Administered 2018-05-22: 20 mg via INTRAVENOUS

## 2018-05-22 MED ORDER — PALONOSETRON HCL INJECTION 0.25 MG/5ML
INTRAVENOUS | Status: AC
  Filled 2018-05-22: qty 5

## 2018-05-22 NOTE — Progress Notes (Signed)
Hematology and Oncology Follow Up Visit  [name] [id number] [birth year] 77 y.o. 05/22/2018 10:20 AM [name], MDFusco, Purcell Nails, MD   Principle Diagnosis: 77 year old man with bladder cancer diagnosed in 2018.  He subsequently developed stage IV disease to the lung and pelvic metastasis in 2019.  His tumor is PDL 1 positive  with CPS score over 90%.   Prior Therapy:   He is status post neoadjuvant chemotherapy utilizing gemcitabine and cisplatin started in September 2018.  He received day 1 of cycle 1 on 05/02/2017 and therapy discontinued after that.  He is status post laparoscopic Cystoprostatectomy and bilateral lymphadenectomy done by Dr. Tresa Moore on 06/29/2017. Final pathology showed a T3bN0 disease.  He has 13 lymph nodes sampled without any evidence of malignancy.  He completed radiation therapy to the pelvis on January 16, 2018 under the care of Dr. Tammi Klippel.  Current therapy: Carboplatin and gemcitabine he is here for day 1 of cycle 6.   Interim History: [name] is here for return visit.  Since the last visit, he reports no major complications or complaints.  He is pruritus have improved after completing a Medrol Dosepak.  His appetite has increased and his performance status continues to improve.  He is not reporting any fevers or chills.  He denies any recent hospitalizations.  He denies any complications related to chemotherapy including infusion related issues or nausea.  His performance status and quality of life remain unchanged.  He does not report any headaches, blurry vision, syncope or seizures.  He denies any dizziness or lethargy.  He does not report any fevers, chills or sweats. He does not report any cough, wheezing or dyspnea on exertion. He does not report any chest pain, palpitation, orthopnea or leg edema. He does not report any nausea, vomiting.  He denies any constipation or diarrhea.  He denied lymphadenopathy or petechiae.  He does not report any frequency  urgency or hesitancy.Denies any in his mood.  Denies any changes in his mood.  Denies any rashes.  Remaining review of systems is negative.  Medications: I have reviewed the patient's current medications.  Current Outpatient Medications  Medication Sig Dispense Refill  . acetaminophen (TYLENOL) 500 MG tablet Take 1,000 mg by mouth every 6 (six) hours as needed for fever.     . diphenhydrAMINE (BENADRYL) 25 MG tablet Take 25 mg by mouth every 6 (six) hours as needed for itching.    . feeding supplement, ENSURE ENLIVE, (ENSURE ENLIVE) LIQD Take 237 mLs by mouth 2 (two) times daily between meals. 237 mL 12  . HYDROmorphone (DILAUDID) 4 MG tablet Take 1 tablet (4 mg total) by mouth every 4 (four) hours as needed for severe pain. 30 tablet 0  . hydrOXYzine (ATARAX/VISTARIL) 50 MG tablet Take 1 tablet (50 mg total) by mouth 4 (four) times daily as needed. 30 tablet 0  . lactose free nutrition (BOOST) LIQD Take 237 mLs by mouth 3 (three) times daily between meals.    . methylPREDNISolone (MEDROL DOSEPAK) 4 MG TBPK tablet Take as directed on the back of the package. 21 tablet 0  . oxyCODONE (OXY IR/ROXICODONE) 5 MG immediate release tablet Take 1-2 tablets (5-10 mg total) by mouth every 4 (four) hours as needed for severe pain. 60 tablet 0  . polyethylene glycol (MIRALAX / GLYCOLAX) packet Take 17 g by mouth daily. 14 each 0  . predniSONE (DELTASONE) 20 MG tablet Take 2 tablets (40 mg total) by mouth daily. 10 tablet 0  .  prochlorperazine (COMPAZINE) 10 MG tablet Take 1 tablet (10 mg total) by mouth every 6 (six) hours as needed for nausea or vomiting. 30 tablet 0   No current facility-administered medications for this visit.      Allergies:  Allergies  Allergen Reactions  . Ciprofloxacin Hives  . Cefepime Rash    Past Medical History, Surgical history, Social history, and Family History were reviewed and updated.  Physical Exam:   Blood pressure (!) 156/85, pulse 87, temperature 98.4 F  (36.9 C), temperature source Oral, resp. rate 18, height 5\' 11"  (1.803 m), weight 190 lb 12.8 oz (86.5 kg), SpO2 99 %.    ECOG: 1   General appearance: Alert, awake without any distress. Head: Atraumatic without abnormalities Oropharynx: Without any thrush or ulcers. Eyes: No scleral icterus. Lymph nodes: No lymphadenopathy noted in the cervical, supraclavicular, or axillary nodes Heart:regular rate and rhythm, without any murmurs or gallops.   Lung: Clear to auscultation without any rhonchi, wheezes or dullness to percussion. Abdomin: Soft, nontender without any shifting dullness or ascites. Musculoskeletal: No clubbing or cyanosis. Neurological: No motor or sensory deficits. Skin: No rashes or lesions.  Without any ecchymosis. Psychiatric: Mood and affect appeared normal.       Lab Results: Lab Results  Component Value Date   WBC 7.9 05/22/2018   HGB 12.9 (L) 05/22/2018   HCT 38.6 05/22/2018   MCV 101.3 (H) 05/22/2018   PLT 253 05/22/2018     Chemistry      Component Value Date/Time   NA 141 05/01/2018 0846   NA 141 05/23/2017 0828   K 3.9 05/01/2018 0846   K 4.0 05/23/2017 0828   CL 105 05/01/2018 0846   CO2 27 05/01/2018 0846   CO2 27 05/23/2017 0828   BUN 15 05/01/2018 0846   BUN 12.5 05/23/2017 0828   CREATININE 0.80 05/01/2018 0846   CREATININE 0.8 05/23/2017 0828      Component Value Date/Time   CALCIUM 9.4 05/01/2018 0846   CALCIUM 9.0 05/23/2017 0828   ALKPHOS 62 05/01/2018 0846   ALKPHOS 57 05/23/2017 0828   AST 17 05/01/2018 0846   AST 13 05/23/2017 0828   ALT 23 05/01/2018 0846   ALT 15 05/23/2017 0828   BILITOT 0.4 05/01/2018 0846   BILITOT 0.37 05/23/2017 0828       Impression and Plan:  77 year old man with:   1.  Stage IV urothelial carcinoma arising from the bladder documented in 2019.  He is currently on chemotherapy utilizing carboplatin and gemcitabine without any major complications at this time.  Plan is to proceed with  cycle 6 today without any dose reduction or delay.  After completing 6 cycles of therapy, we will repeat staging CT scan.  Risks and benefits of proceeding with chemotherapy was reviewed today and is agreeable to continue.  2. Hip pain: Related to cancer metastasis to the hip.  Much improved at this time after radiation and chemotherapy.  3.  Bacteremia: Completed course of antibiotic at this time.  No recurrent infections noted.  His antibiotic has been withheld at this time.  4. Renal function: His creatinine clearance remains normal at this time.  5.  Goals of care: His performance status remain excellent and aggressive therapy is warranted.  6.  IV access: His Port-A-Cath has been removed at this time.  And his PICC line has been removed after completing intravenous antibiotics.  7.  Antiemetics: No nausea or vomiting reported at this time.  8.  Rash:  Resolved at this time after stopping antibiotics.  Gemcitabine could also be contributing but appears to be improving at this time.  9.  Anemia: Hemoglobin is close to normal range.  10.  Follow-up: Will be in 4 weeks after repeating imaging studies.  25  minutes was spent with the patient face-to-face today.  More than 50% of time was dedicated to reviewing his disease status, treatment options and coordinating his multifaceted care.  Zola Button, MD 10/7/201910:20 AM

## 2018-05-22 NOTE — Patient Instructions (Signed)
[facility] Discharge Instructions for Patients Receiving Chemotherapy  Today you received the following chemotherapy agents Gemzar/Carboplatin To help prevent nausea and vomiting after your treatment, we encourage you to take your nausea medication as prescribed.   If you develop nausea and vomiting that is not controlled by your nausea medication, call the clinic.   BELOW ARE SYMPTOMS THAT SHOULD BE REPORTED IMMEDIATELY:  *FEVER GREATER THAN 100.5 F  *CHILLS WITH OR WITHOUT FEVER  NAUSEA AND VOMITING THAT IS NOT CONTROLLED WITH YOUR NAUSEA MEDICATION  *UNUSUAL SHORTNESS OF BREATH  *UNUSUAL BRUISING OR BLEEDING  TENDERNESS IN MOUTH AND THROAT WITH OR WITHOUT PRESENCE OF ULCERS  *URINARY PROBLEMS  *BOWEL PROBLEMS  UNUSUAL RASH Items with * indicate a potential emergency and should be followed up as soon as possible.  Feel free to call the clinic should you have any questions or concerns. The clinic phone number is [phone].  Please show the CHEMO ALERT CARD at check-in to the Emergency Department and triage nurse.   

## 2018-05-22 NOTE — Telephone Encounter (Signed)
Appts scheduled avs/calendar printed per 10/7 los °

## 2018-05-23 DIAGNOSIS — R7881 Bacteremia: Secondary | ICD-10-CM | POA: Diagnosis not present

## 2018-05-23 DIAGNOSIS — C7951 Secondary malignant neoplasm of bone: Secondary | ICD-10-CM | POA: Diagnosis not present

## 2018-05-23 DIAGNOSIS — B9689 Other specified bacterial agents as the cause of diseases classified elsewhere: Secondary | ICD-10-CM | POA: Diagnosis not present

## 2018-05-23 DIAGNOSIS — D61810 Antineoplastic chemotherapy induced pancytopenia: Secondary | ICD-10-CM | POA: Diagnosis not present

## 2018-05-23 DIAGNOSIS — C679 Malignant neoplasm of bladder, unspecified: Secondary | ICD-10-CM | POA: Diagnosis not present

## 2018-05-23 DIAGNOSIS — Z452 Encounter for adjustment and management of vascular access device: Secondary | ICD-10-CM | POA: Diagnosis not present

## 2018-05-25 ENCOUNTER — Telehealth: Payer: Self-pay | Admitting: *Deleted

## 2018-05-25 NOTE — Telephone Encounter (Signed)
Received call from patients daughter in law Sheffield Slider [phone].  She states that patient is telling her that the rash post chemo has come back and that he has in the past developed whelps and blisters from the Carbo/Gemzar and also from a previous antibiotic dosing.  States patient just finished a prednisone pak and that benadryl and atarax are not helping.  Dr Alen Blew is out of the office today and [facility] was willing to see patient.  Patient declined coming in for visit today, recommended that we could also see him tomorrow if he didn't think he could get here today and he declined.  He said that he would call if it got that bad and I encouraged him to utilize the [facility] clinic here to avoid going into the weekend and having to go to the ER.

## 2018-05-26 DIAGNOSIS — L209 Atopic dermatitis, unspecified: Secondary | ICD-10-CM | POA: Diagnosis not present

## 2018-06-05 ENCOUNTER — Ambulatory Visit (INDEPENDENT_AMBULATORY_CARE_PROVIDER_SITE_OTHER): Payer: Medicare Other | Admitting: Infectious Diseases

## 2018-06-05 ENCOUNTER — Other Ambulatory Visit: Payer: Self-pay

## 2018-06-05 ENCOUNTER — Ambulatory Visit
Admission: RE | Admit: 2018-06-05 | Discharge: 2018-06-05 | Disposition: A | Discharge status: Home / Self Care | Payer: Medicare Other | Source: Ambulatory Visit | Attending: Infectious Diseases | Admitting: Infectious Diseases

## 2018-06-05 ENCOUNTER — Encounter: Payer: Self-pay | Admitting: Infectious Diseases

## 2018-06-05 DIAGNOSIS — R238 Other skin changes: Principal | ICD-10-CM

## 2018-06-05 DIAGNOSIS — M7989 Other specified soft tissue disorders: Secondary | ICD-10-CM

## 2018-06-05 DIAGNOSIS — T50905D Adverse effect of unspecified drugs, medicaments and biological substances, subsequent encounter: Secondary | ICD-10-CM

## 2018-06-05 DIAGNOSIS — R7881 Bacteremia: Secondary | ICD-10-CM

## 2018-06-05 DIAGNOSIS — T50905A Adverse effect of unspecified drugs, medicaments and biological substances, initial encounter: Secondary | ICD-10-CM | POA: Insufficient documentation

## 2018-06-05 NOTE — Progress Notes (Signed)
Error

## 2018-06-05 NOTE — Assessment & Plan Note (Signed)
Will have him seen by derm.

## 2018-06-05 NOTE — Addendum Note (Signed)
Addended by: Anhar Mcdermott C on: 06/05/2018 10:49 AM   Modules accepted: Orders

## 2018-06-05 NOTE — Assessment & Plan Note (Signed)
Will send him for doppler to eval.

## 2018-06-05 NOTE — Assessment & Plan Note (Signed)
Rothia.  Will repeat his BCx, he has been doing well despite incomplete treatment.

## 2018-06-05 NOTE — Progress Notes (Signed)
   Subjective:    Patient ID: [name], male    DOB: [birth year], 77 y.o.   MRN: [id number]  HPI 77 yo M with hx of bladder CA, ilieal conduit, receiveing CTX (carboplatin/gemcitabine last dose 05-22-18 6th cycle), then adm to [facility] 8-26 to 9-3 with fever and Rothia mucilagnosa bacteremia as well as TTE showing TV nodular echodensity. He was felt to be too high risk for TEE.  His port was removed.  He was started on cefepime in the hospital and at d/c he continued on this. His planned stop date was 05-25-18.  His UCx grew E coli (I- amp, R- cipro).  At home, he developed diffuse rash due to cefepime. He was then given vancomycin, then merrem, both of  which again caused him to have a rash. .    Today he complains of pain and swelling in his R knee. He is going to see his oncologist.  He continues to have pruritis.  He has repeat scans pending at [place].   Review of Systems  Constitutional: Positive for diaphoresis. Negative for chills and fever.  Gastrointestinal: Positive for constipation. Negative for diarrhea.  Musculoskeletal: Positive for joint swelling.  Skin: Positive for rash.  Please see HPI. All other systems reviewed and negative.      Objective:   Physical Exam  Constitutional: He is oriented to person, place, and time. He appears well-developed and well-nourished.  HENT:  Mouth/Throat: No oropharyngeal exudate.  Eyes: Pupils are equal, round, and reactive to light. EOM are normal.  Neck: Normal range of motion. Neck supple.  Cardiovascular: Normal rate, regular rhythm and normal heart sounds.  Pulmonary/Chest: Effort normal and breath sounds normal.  Abdominal: Soft. Bowel sounds are normal. He exhibits no distension. There is no tenderness.  Musculoskeletal: He exhibits edema.       Legs: Lymphadenopathy:    He has no cervical adenopathy.  Neurological: He is alert and oriented to person, place, and time.  Skin: Rash noted.          Assessment & Plan:

## 2018-06-05 NOTE — Addendum Note (Signed)
Addended by: Reggy Eye on: 06/05/2018 11:38 AM   Modules accepted: Orders

## 2018-06-06 ENCOUNTER — Telehealth: Payer: Self-pay | Admitting: Infectious Diseases

## 2018-06-06 ENCOUNTER — Ambulatory Visit (HOSPITAL_COMMUNITY)
Admission: RE | Admit: 2018-06-06 | Discharge: 2018-06-06 | Disposition: A | Discharge status: Home / Self Care | Payer: Medicare Other | Source: Ambulatory Visit | Attending: Oncology | Admitting: Oncology

## 2018-06-06 DIAGNOSIS — C679 Malignant neoplasm of bladder, unspecified: Secondary | ICD-10-CM

## 2018-06-06 NOTE — Telephone Encounter (Signed)
Called pt and let him know his u/s was (-) for DVT.  Spoke with wife and pt.  He will have CT scan.  He feels that swelling is about the same.  I asked him to call if he feels worse.

## 2018-06-09 ENCOUNTER — Ambulatory Visit (HOSPITAL_COMMUNITY)
Admission: RE | Admit: 2018-06-09 | Discharge: 2018-06-09 | Disposition: A | Discharge status: Home / Self Care | Payer: Medicare Other | Source: Ambulatory Visit | Attending: Oncology | Admitting: Oncology

## 2018-06-09 DIAGNOSIS — I2510 Atherosclerotic heart disease of native coronary artery without angina pectoris: Secondary | ICD-10-CM | POA: Insufficient documentation

## 2018-06-09 DIAGNOSIS — K449 Diaphragmatic hernia without obstruction or gangrene: Secondary | ICD-10-CM | POA: Diagnosis not present

## 2018-06-09 DIAGNOSIS — C679 Malignant neoplasm of bladder, unspecified: Secondary | ICD-10-CM | POA: Diagnosis not present

## 2018-06-09 DIAGNOSIS — I700 Atherosclerosis of aorta: Secondary | ICD-10-CM | POA: Insufficient documentation

## 2018-06-09 DIAGNOSIS — J90 Pleural effusion, not elsewhere classified: Secondary | ICD-10-CM | POA: Diagnosis not present

## 2018-06-09 DIAGNOSIS — Z5111 Encounter for antineoplastic chemotherapy: Secondary | ICD-10-CM | POA: Diagnosis not present

## 2018-06-09 MED ORDER — SODIUM CHLORIDE 0.9 % IJ SOLN
INTRAMUSCULAR | Status: AC
  Filled 2018-06-09: qty 200

## 2018-06-09 MED ORDER — IOHEXOL 300 MG/ML  SOLN
100.0000 mL | Freq: Once | INTRAMUSCULAR | Status: AC | PRN
  Administered 2018-06-09: 100 mL via INTRAVENOUS

## 2018-06-11 LAB — CULTURE, BLOOD (SINGLE)
MICRO NUMBER:: 91264428
MICRO NUMBER:: 91264429
Result:: NO GROWTH
Result:: NO GROWTH
SPECIMEN QUALITY:: ADEQUATE
SPECIMEN QUALITY:: ADEQUATE

## 2018-06-12 ENCOUNTER — Telehealth: Payer: Self-pay

## 2018-06-12 ENCOUNTER — Telehealth: Payer: Self-pay | Admitting: Oncology

## 2018-06-12 NOTE — Telephone Encounter (Signed)
-----   Message from [name], MD sent at 06/12/2018 10:59 AM EDT ----- I can see him 10/29 at 11:00 am. Please send a message to scheduling.  Thanks.  ----- Message ----- From: Scot Dock, RN Sent: 06/12/2018  10:18 AM EDT To: [name], MD  Patient daughter called and stated that the patient pain has increased - right hip/groin/leg - he is unable to sit down, is taking the dilaudid and oxy again with minimal relief, feet swollen. Scans performed on 10/25 and next appt 11/4. Daughter would like to know if appt can be moved up or if you can contact her via phone with results and plan with next steps and plan for pain management. Please advise. Thanks

## 2018-06-12 NOTE — Telephone Encounter (Signed)
Scheduled apt per 10/28 sch message - pt is aware of appt date and time.

## 2018-06-12 NOTE — Telephone Encounter (Signed)
Scheduling message sent and appointment scheduled.

## 2018-06-13 ENCOUNTER — Telehealth: Payer: Self-pay

## 2018-06-13 ENCOUNTER — Inpatient Hospital Stay (HOSPITAL_BASED_OUTPATIENT_CLINIC_OR_DEPARTMENT_OTHER): Payer: Medicare Other | Admitting: Oncology

## 2018-06-13 VITALS — BP 162/87 | HR 109 | Temp 97.8°F | Resp 17 | Ht 71.0 in | Wt 201.6 lb

## 2018-06-13 DIAGNOSIS — G893 Neoplasm related pain (acute) (chronic): Secondary | ICD-10-CM | POA: Diagnosis not present

## 2018-06-13 DIAGNOSIS — C7800 Secondary malignant neoplasm of unspecified lung: Secondary | ICD-10-CM

## 2018-06-13 DIAGNOSIS — Z9221 Personal history of antineoplastic chemotherapy: Secondary | ICD-10-CM | POA: Diagnosis not present

## 2018-06-13 DIAGNOSIS — C679 Malignant neoplasm of bladder, unspecified: Secondary | ICD-10-CM | POA: Diagnosis not present

## 2018-06-13 DIAGNOSIS — D649 Anemia, unspecified: Secondary | ICD-10-CM | POA: Diagnosis not present

## 2018-06-13 DIAGNOSIS — R21 Rash and other nonspecific skin eruption: Secondary | ICD-10-CM

## 2018-06-13 DIAGNOSIS — R7881 Bacteremia: Secondary | ICD-10-CM

## 2018-06-13 DIAGNOSIS — R600 Localized edema: Secondary | ICD-10-CM

## 2018-06-13 DIAGNOSIS — Z923 Personal history of irradiation: Secondary | ICD-10-CM | POA: Diagnosis not present

## 2018-06-13 DIAGNOSIS — C7951 Secondary malignant neoplasm of bone: Secondary | ICD-10-CM

## 2018-06-13 DIAGNOSIS — Z5111 Encounter for antineoplastic chemotherapy: Secondary | ICD-10-CM | POA: Diagnosis not present

## 2018-06-13 MED ORDER — FENTANYL 25 MCG/HR TD PT72: 25.0000 ug | MEDICATED_PATCH | TRANSDERMAL | 0 refills | Status: DC

## 2018-06-13 MED ORDER — HYDROMORPHONE HCL 4 MG PO TABS: 4.0000 mg | ORAL_TABLET | ORAL | 0 refills | Status: DC | PRN

## 2018-06-13 MED ORDER — OXYCODONE HCL 5 MG PO TABS: 5.0000 mg | ORAL_TABLET | ORAL | 0 refills | Status: DC | PRN

## 2018-06-13 NOTE — Telephone Encounter (Signed)
Pri avs and calender of upcoming appointment. Per 10/29 los

## 2018-06-13 NOTE — Progress Notes (Signed)
Hematology and Oncology Follow Up Visit  [name] [id number] [birth year] 77 y.o. 06/13/2018 10:34 AM [name], MDFusco, Purcell Nails, MD   Principle Diagnosis: 77 year old man with stage IV bladder cancer with lung and pelvic metastasis confirmed and in 2019.  His tumor is PDL 1 positive  with CPS score over 90%.  His original tumor diagnosed in 2018 with localized tumor.  Prior Therapy:   He is status post neoadjuvant chemotherapy utilizing gemcitabine and cisplatin started in September 2018.  He received day 1 of cycle 1 on 05/02/2017 and therapy discontinued after that.  He is status post laparoscopic Cystoprostatectomy and bilateral lymphadenectomy done by Dr. Tresa Moore on 06/29/2017. Final pathology showed a T3bN0 disease.  He has 13 lymph nodes sampled without any evidence of malignancy.  He completed radiation therapy to the pelvis on January 16, 2018 under the care of Dr. Tammi Klippel.   Carboplatin and gemcitabine he is status post 6 cycles of therapy completed on 05/22/2018.   Current therapy: Under consideration to start different salvage therapy.  Interim History: [name] presents today for a follow-up.  Since the last visit, he reports few complaints.  He has reported increase in his right hip discomfort and has been requiring increased oxycodone and Dilaudid use in the last few weeks.  He reports the pain is rather sharp in his right hip radiating to the groin.  He has not reported decrease in his appetite or functional status.  He remains ambulatory.  He did report right lower extremity swelling and ultrasound Doppler on 06/05/2018 showed no deep vein thrombosis.  He is off antibiotics at this time and his blood culture on 06/05/2018 showed no growth.  He does not report any headaches, blurry vision, syncope or seizures.  He denies any changes in his mentation or confusion.  He does not report any fevers, chills or sweats. He does not report any cough, wheezing or dyspnea on  exertion. He does not report any chest pain, palpitation, orthopnea or dyspnea on exertion. He does not report any nausea, vomiting.  He denies any changes in his bowels.  He denied lymphadenopathy or petechiae.  He does not report any frequency urgency or hesitancy.any anxiety or depression.  He denies any bleeding or clotting tendency.  He denies any ecchymosis or petechiae.  Remaining review of systems is negative.  Medications: I have reviewed the patient's current medications.  Current Outpatient Medications  Medication Sig Dispense Refill  . acetaminophen (TYLENOL) 500 MG tablet Take 1,000 mg by mouth every 6 (six) hours as needed for fever.     . diphenhydrAMINE (BENADRYL) 25 MG tablet Take 25 mg by mouth every 6 (six) hours as needed for itching.    . feeding supplement, ENSURE ENLIVE, (ENSURE ENLIVE) LIQD Take 237 mLs by mouth 2 (two) times daily between meals. 237 mL 12  . HYDROmorphone (DILAUDID) 4 MG tablet Take 1 tablet (4 mg total) by mouth every 4 (four) hours as needed for severe pain. 30 tablet 0  . hydrOXYzine (ATARAX/VISTARIL) 50 MG tablet Take 1 tablet (50 mg total) by mouth 4 (four) times daily as needed. 30 tablet 0  . lactose free nutrition (BOOST) LIQD Take 237 mLs by mouth 3 (three) times daily between meals.    . methylPREDNISolone (MEDROL DOSEPAK) 4 MG TBPK tablet Take as directed on the back of the package. 21 tablet 0  . oxyCODONE (OXY IR/ROXICODONE) 5 MG immediate release tablet Take 1-2 tablets (5-10 mg total) by mouth every 4 (four)  hours as needed for severe pain. 60 tablet 0  . polyethylene glycol (MIRALAX / GLYCOLAX) packet Take 17 g by mouth daily. 14 each 0  . predniSONE (DELTASONE) 20 MG tablet Take 2 tablets (40 mg total) by mouth daily. 10 tablet 0  . prochlorperazine (COMPAZINE) 10 MG tablet Take 1 tablet (10 mg total) by mouth every 6 (six) hours as needed for nausea or vomiting. 30 tablet 0   No current facility-administered medications for this visit.       Allergies:  Allergies  Allergen Reactions  . Ciprofloxacin Hives  . Cefepime Rash    Past Medical History, Surgical history, Social history, and Family History were reviewed and updated.  Physical Exam:   There were no vitals taken for this visit.    ECOG: 1  Blood pressure (!) 162/87, pulse (!) 109, temperature 97.8 F (36.6 C), temperature source Oral, resp. rate 17, height 5\' 11"  (1.803 m), weight 201 lb 9.6 oz (91.4 kg), SpO2 100 %.    General appearance: Comfortable appearing without any discomfort Head: Normocephalic without any trauma Oropharynx: Mucous membranes are moist and pink without any thrush or ulcers. Eyes: Pupils are equal and round reactive to light. Lymph nodes: No cervical, supraclavicular, inguinal or axillary lymphadenopathy.   Heart:regular rate and rhythm.  S1 and S2.  Right foot edema noted more than left. Lung: Clear without any rhonchi or wheezes.  No dullness to percussion. Abdomin: Soft, nontender, nondistended with good bowel sounds.  No hepatosplenomegaly. Musculoskeletal: No joint deformity or effusion.  Full range of motion noted. Neurological: No deficits noted on motor, sensory and deep tendon reflex exam. Skin: No petechial rash or dryness.  No rashes or lesions.       Lab Results: Lab Results  Component Value Date   WBC 7.9 05/22/2018   HGB 12.9 (L) 05/22/2018   HCT 38.6 05/22/2018   MCV 101.3 (H) 05/22/2018   PLT 253 05/22/2018     Chemistry      Component Value Date/Time   NA 144 05/22/2018 0945   NA 141 05/23/2017 0828   K 3.9 05/22/2018 0945   K 4.0 05/23/2017 0828   CL 108 05/22/2018 0945   CO2 28 05/22/2018 0945   CO2 27 05/23/2017 0828   BUN 12 05/22/2018 0945   BUN 12.5 05/23/2017 0828   CREATININE 0.74 05/22/2018 0945   CREATININE 0.8 05/23/2017 0828      Component Value Date/Time   CALCIUM 9.1 05/22/2018 0945   CALCIUM 9.0 05/23/2017 0828   ALKPHOS 59 05/22/2018 0945   ALKPHOS 57 05/23/2017  0828   AST 20 05/22/2018 0945   AST 13 05/23/2017 0828   ALT 37 05/22/2018 0945   ALT 15 05/23/2017 0828   BILITOT 0.3 05/22/2018 0945   BILITOT 0.37 05/23/2017 0828      EXAM: CT CHEST, ABDOMEN, AND PELVIS WITH CONTRAST  TECHNIQUE: Multidetector CT imaging of the chest, abdomen and pelvis was performed following the standard protocol during bolus administration of intravenous contrast.  CONTRAST:  168mL OMNIPAQUE IOHEXOL 300 MG/ML  SOLN  COMPARISON:  Multiple exams, including 03/31/2018  FINDINGS: CT CHEST FINDINGS  Imaging of the chest is in the delayed phase due to an apparent misunderstanding of the order resulting in initial scanning of the abdomen and pelvis and then bringing the patient back to scan the chest. The result is a lack of concentrated vascular contrast almost akin to a noncontrast CT chest. This does not affect our ability to assess  the pulmonary nodules or adenopathy, but if there was specific concern for vascular abnormalities aside from atherosclerosis, we are happy to have the patient return for a second bolus of contrast and properly timed postcontrast chest imaging.  Cardiovascular: Coronary, aortic arch, and branch vessel atherosclerotic vascular disease. No cardiomegaly or pericardial effusion.  Mediastinum/Nodes: Small mediastinal and hilar lymph nodes are not pathologically enlarged by size criteria. Small type 1 hiatal hernia and potentially mild distal esophageal wall thickening.  Lungs/Pleura: Trace left pleural effusion, nonspecific for transudative versus exudative etiology. Biapical pleuroparenchymal scarring. Generally there has been mild enlargement of the scattered bilateral pulmonary nodules. For example, a right lower lobe nodule measuring 1.0 by 0.8 cm on image 114/19 previously measured 0.7 by 0.6 cm. A spiculated 1.5 by 1.3 cm left upper lobe nodule on image 33/19 previously measured 1.3 by 1.3 cm. A 0.8 by 0.7 cm  right middle lobe nodule on image 105/19 previously measured 0.7 by 0.6 cm. Some of the nodules are stable. I do not see any new nodules.  Airway thickening is present, suggesting bronchitis or reactive airways disease. There is some mild atelectasis and adjacent pleural thickening along the right lower lobe posteromedially. Filling defect in the right upper trachea is new and probably from mucous.  Musculoskeletal: Old right lateral rib fractures appear healed. Mild thoracic spondylosis.  CT ABDOMEN PELVIS FINDINGS  Hepatobiliary: Stable hepatic cysts.  Otherwise unremarkable.  Pancreas: Vascular calcifications adjacent to the head of the pancreas. Overall the pancreas appears unremarkable.  Spleen: Unremarkable  Adrenals/Urinary Tract: Small right renal hypodense to characterize lesions are technically too small although statistically likely to be cysts. Ileal conduit noted in the right abdomen, without hydronephrosis. There is excreted contrast medium in the collecting systems on the initial images, and accordingly today's exam is not sensitive for renal calculi. No obvious filling defects along the remaining urothelium. The bladder is surgically absent.  Stomach/Bowel: Mild rectal wall thickening distally. There is potential wall thickening of the lower cecum as on images 40 through 52 of series 5 this appearance would ordinarily raise some concern for tumor, but this thickening was not present on 03/31/2018 and could thus rather be a manifestation of inflammation or contraction. The ileocecal valve appears unremarkable.  Vascular/Lymphatic: Aortoiliac atherosclerotic vascular disease. No pathologic adenopathy is observed.  Reproductive: Cystoprostatectomy.  Other: Small amount of free pelvic fluid, new compared to the prior exam.  Musculoskeletal: Lytic destructive lesion of the right inferior pubic ramus, soft tissue portion 4.9 cm in thickness and  previously 5.4 cm in thickness. Adjacent edema along the right hip adductor musculature.  IMPRESSION: 1. Mild enlargement of the scattered pulmonary nodules compatible with progressive pulmonary metastatic lesions. 2. The large lytic destructive lesion of the right inferior pubic ramus is mildly reduced in size. 3. New trace left pleural effusion and new minimal pelvic ascites. 4. New abnormal wall thickening in the rectum. This could well be inflammatory given that it was not present on 03/31/2018. Similarly there is wall thickening along the inferior margin of the cecum that was not present previously, which might be inflammatory. 5. Other imaging findings of potential clinical significance: Aortic Atherosclerosis (ICD10-I70.0). Coronary atherosclerosis. Small type 1 hiatal hernia. Mild distal esophagitis is suspected. Airway thickening is present, suggesting bronchitis or reactive airways disease. Cystoprostatectomy with ileal conduit common no obstruction or current tumor along the urothelium.   Impression and Plan:  77 year old man with:   1.  Bladder cancer diagnosed in 2018 and subsequently developed stage  IV disease with pulmonary nodules as well as right hip metastasis.  He completed 6 cycles of chemotherapy utilizing gemcitabine and carboplatin without any major complications.  CT scan obtained on 06/09/2018 was personally reviewed and reviewed with the patient and his family.  His scan showed a slight increase in his pulmonary nodules although stable disease otherwise.  The natural course of this disease was discussed today and treatment options were reviewed.  These options would include continued observation and surveillance versus starting salvage therapy.  Given his increased hip pain and increase in the pulmonary nodules I recommended pursuing salvage therapy.  He is PDL 1 status indicate possible response to Pembrolizumab and would be a good candidate for it.  Risks  and benefits associated with immunotherapy was reviewed today in detail.  These include nausea, fatigue, pruritus, pneumonitis, colitis and thyroid disease.  Rare potentially harmful complications associated with immunotherapy was also discussed.  He is agreeable to proceed at 200 mg every 3 weeks in the immediate future.   2. Hip pain: Related to cancer metastasis to the hip.  The status of his pain has worsened at this time despite no major changes on imaging studies.  I will obtain MRI of the hip to document any changes.  From a pain management standpoint I will start him on long-acting pain medication use and fentanyl.  He will use 25 mcg every 3 days.  Complication associated with this medication calling somnolence, confusion and constipation.  He will continue use oxycodone and Dilaudid as needed.  3.  Bacteremia: Completed course of antibiotic without any evidence of recurrence.  Recent blood cultures are negative.  4.  Lower extremity edema: Likely related to prednisone which she has been tapered off.  5.  Goals of care: Treatment remains palliative although his disease remains incurable at this time.  6.  IV access: Peripheral veins will be used initially will consider reinserting a Port-A-Cath if his blood cultures remains negative.  7.  Antiemetics: I anticipate less nausea and vomiting associated with immunotherapy.  Antiemetics available to him.  8.  Rash: Resolving at this time with very little pruritus.  9.  Immune mediated side effects: We will continue to monitor those closely including thyroid function.  10.  Follow-up: Will be in in the immediate future to start salvage therapy with Pembrolizumab  30  minutes was spent with the patient face-to-face today.  More than 50% of time was dedicated to discussing the natural course of his disease, reviewing imaging studies and coordinating treatment plan and discussing side effects associated with therapy.  Zola Button,  MD 10/29/201910:34 AM

## 2018-06-13 NOTE — Progress Notes (Signed)
DISCONTINUE ON PATHWAY REGIMEN - Bladder     A cycle is every 21 days:     Carboplatin      Gemcitabine   **Always confirm dose/schedule in your pharmacy ordering system**  REASON: Disease Progression PRIOR TREATMENT: [id number]: Carboplatin AUC=5 D1 + Gemcitabine 1,000 mg/m2 D1, 8 q21 Days for a Maximum of 6 Cycles TREATMENT RESPONSE: Partial Response (PR)  START ON PATHWAY REGIMEN - Bladder     A cycle is 21 days:     Pembrolizumab   **Always confirm dose/schedule in your pharmacy ordering system**  Patient Characteristics: Metastatic Disease, Second Line, FGFR2/FGFR3 Mutation Negative or Unknown AJCC M Category: M0 AJCC N Category: NX AJCC T Category: TX Current evidence of distant metastases<= Yes AJCC 8 Stage Grouping: IVB Line of Therapy: Second Line FGFR2/FGFR3 Mutation Status: Did Not Order Test Intent of Therapy: Non-Curative / Palliative Intent, Discussed with Patient

## 2018-06-14 ENCOUNTER — Telehealth: Payer: Self-pay | Admitting: *Deleted

## 2018-06-14 NOTE — Telephone Encounter (Signed)
Patient daughter called to cancel his upcoming appointment as he has just had scans that show his cancer has grown and he will need to start more intense cancer therapy. Advised they will call us if they need to.

## 2018-06-15 NOTE — Progress Notes (Signed)
PA for Fentanyl patches has been submitted.

## 2018-06-15 NOTE — Progress Notes (Signed)
PA for fentanyl patches has been approved.

## 2018-06-16 ENCOUNTER — Other Ambulatory Visit: Payer: Medicare Other

## 2018-06-19 ENCOUNTER — Ambulatory Visit (HOSPITAL_COMMUNITY)
Admission: RE | Admit: 2018-06-19 | Discharge: 2018-06-19 | Disposition: A | Discharge status: Home / Self Care | Payer: Medicare Other | Source: Ambulatory Visit | Attending: Oncology | Admitting: Oncology

## 2018-06-19 ENCOUNTER — Encounter (HOSPITAL_COMMUNITY): Payer: Self-pay | Admitting: Radiology

## 2018-06-19 ENCOUNTER — Ambulatory Visit: Payer: Medicare Other | Admitting: Oncology

## 2018-06-19 ENCOUNTER — Other Ambulatory Visit: Payer: Medicare Other

## 2018-06-19 DIAGNOSIS — R600 Localized edema: Secondary | ICD-10-CM | POA: Insufficient documentation

## 2018-06-19 DIAGNOSIS — C679 Malignant neoplasm of bladder, unspecified: Secondary | ICD-10-CM | POA: Insufficient documentation

## 2018-06-19 DIAGNOSIS — M25451 Effusion, right hip: Secondary | ICD-10-CM | POA: Diagnosis not present

## 2018-06-19 MED ORDER — GADOBUTROL 1 MMOL/ML IV SOLN
9.0000 mL | Freq: Once | INTRAVENOUS | Status: AC | PRN
  Administered 2018-06-19: 9 mL via INTRAVENOUS

## 2018-06-20 ENCOUNTER — Other Ambulatory Visit: Payer: Self-pay | Admitting: Oncology

## 2018-06-20 ENCOUNTER — Other Ambulatory Visit: Payer: Medicare Other

## 2018-06-20 DIAGNOSIS — S329XXA Fracture of unspecified parts of lumbosacral spine and pelvis, initial encounter for closed fracture: Secondary | ICD-10-CM

## 2018-06-20 DIAGNOSIS — C679 Malignant neoplasm of bladder, unspecified: Secondary | ICD-10-CM

## 2018-06-21 ENCOUNTER — Other Ambulatory Visit: Payer: Self-pay | Admitting: *Deleted

## 2018-06-21 ENCOUNTER — Inpatient Hospital Stay: Payer: Medicare Other | Attending: Oncology

## 2018-06-21 ENCOUNTER — Inpatient Hospital Stay: Payer: Medicare Other

## 2018-06-21 VITALS — BP 148/89 | HR 88 | Temp 97.7°F | Resp 18 | Ht 71.0 in | Wt 196.5 lb

## 2018-06-21 DIAGNOSIS — C679 Malignant neoplasm of bladder, unspecified: Secondary | ICD-10-CM | POA: Insufficient documentation

## 2018-06-21 DIAGNOSIS — Z9221 Personal history of antineoplastic chemotherapy: Secondary | ICD-10-CM | POA: Insufficient documentation

## 2018-06-21 DIAGNOSIS — Z923 Personal history of irradiation: Secondary | ICD-10-CM | POA: Insufficient documentation

## 2018-06-21 DIAGNOSIS — C7800 Secondary malignant neoplasm of unspecified lung: Secondary | ICD-10-CM | POA: Diagnosis not present

## 2018-06-21 DIAGNOSIS — Z5112 Encounter for antineoplastic immunotherapy: Secondary | ICD-10-CM | POA: Diagnosis not present

## 2018-06-21 DIAGNOSIS — Z79899 Other long term (current) drug therapy: Secondary | ICD-10-CM | POA: Diagnosis not present

## 2018-06-21 DIAGNOSIS — C7951 Secondary malignant neoplasm of bone: Secondary | ICD-10-CM | POA: Diagnosis not present

## 2018-06-21 LAB — CBC WITH DIFFERENTIAL (CANCER CENTER ONLY)
Abs Immature Granulocytes: 0.04 10*3/uL (ref 0.00–0.07)
Basophils Absolute: 0.1 10*3/uL (ref 0.0–0.1)
Basophils Relative: 1 %
Eosinophils Absolute: 0.1 10*3/uL (ref 0.0–0.5)
Eosinophils Relative: 2 %
HCT: 38.2 % — ABNORMAL LOW (ref 39.0–52.0)
Hemoglobin: 12.1 g/dL — ABNORMAL LOW (ref 13.0–17.0)
Immature Granulocytes: 1 %
Lymphocytes Relative: 14 %
Lymphs Abs: 0.8 10*3/uL (ref 0.7–4.0)
MCH: 33.4 pg (ref 26.0–34.0)
MCHC: 31.7 g/dL (ref 30.0–36.0)
MCV: 105.5 fL — ABNORMAL HIGH (ref 80.0–100.0)
Monocytes Absolute: 0.8 10*3/uL (ref 0.1–1.0)
Monocytes Relative: 14 %
Neutro Abs: 3.9 10*3/uL (ref 1.7–7.7)
Neutrophils Relative %: 68 %
Platelet Count: 230 10*3/uL (ref 150–400)
RBC: 3.62 MIL/uL — ABNORMAL LOW (ref 4.22–5.81)
RDW: 17.2 % — ABNORMAL HIGH (ref 11.5–15.5)
WBC Count: 5.6 10*3/uL (ref 4.0–10.5)
nRBC: 0 % (ref 0.0–0.2)

## 2018-06-21 LAB — CMP (CANCER CENTER ONLY)
ALT: 12 U/L (ref 0–44)
AST: 17 U/L (ref 15–41)
Albumin: 2.2 g/dL — ABNORMAL LOW (ref 3.5–5.0)
Alkaline Phosphatase: 73 U/L (ref 38–126)
Anion gap: 9 (ref 5–15)
BUN: 7 mg/dL — ABNORMAL LOW (ref 8–23)
CO2: 25 mmol/L (ref 22–32)
Calcium: 8.6 mg/dL — ABNORMAL LOW (ref 8.9–10.3)
Chloride: 108 mmol/L (ref 98–111)
Creatinine: 0.81 mg/dL (ref 0.61–1.24)
GFR, Est AFR Am: >60 mL/min (ref >60)
GFR, Estimated: >60 mL/min (ref >60)
Glucose, Bld: 139 mg/dL — ABNORMAL HIGH (ref 70–99)
Potassium: 3.8 mmol/L (ref 3.5–5.1)
Sodium: 142 mmol/L (ref 135–145)
Total Bilirubin: 0.2 mg/dL — ABNORMAL LOW (ref 0.3–1.2)
Total Protein: 4.9 g/dL — ABNORMAL LOW (ref 6.5–8.1)

## 2018-06-21 MED ORDER — SODIUM CHLORIDE 0.9 % IV SOLN
Freq: Once | INTRAVENOUS | Status: AC
  Administered 2018-06-21: 10:00:00 via INTRAVENOUS
  Filled 2018-06-21: qty 250

## 2018-06-21 MED ORDER — SODIUM CHLORIDE 0.9 % IV SOLN
200.0000 mg | Freq: Once | INTRAVENOUS | Status: AC
  Administered 2018-06-21: 200 mg via INTRAVENOUS
  Filled 2018-06-21: qty 8

## 2018-06-21 MED ORDER — FENTANYL 50 MCG/HR TD PT72: 50.0000 ug | MEDICATED_PATCH | TRANSDERMAL | 0 refills | Status: DC

## 2018-06-21 NOTE — Progress Notes (Signed)
06/21/18  Verified with RN [place] patient is no longer taking Prednisone and Medrol Dospak.  Medications discontinued off patients medication record.  Henreitta Leber, PharmD

## 2018-06-21 NOTE — Patient Instructions (Signed)
[facility] Discharge Instructions for Patients Receiving Chemotherapy  Today you received the following chemotherapy agents: Pembrolizumab (Keytruda)  To help prevent nausea and vomiting after your treatment, we encourage you to take your nausea medication as prescribed.    If you develop nausea and vomiting that is not controlled by your nausea medication, call the clinic.   BELOW ARE SYMPTOMS THAT SHOULD BE REPORTED IMMEDIATELY:  *FEVER GREATER THAN 100.5 F  *CHILLS WITH OR WITHOUT FEVER  NAUSEA AND VOMITING THAT IS NOT CONTROLLED WITH YOUR NAUSEA MEDICATION  *UNUSUAL SHORTNESS OF BREATH  *UNUSUAL BRUISING OR BLEEDING  TENDERNESS IN MOUTH AND THROAT WITH OR WITHOUT PRESENCE OF ULCERS  *URINARY PROBLEMS  *BOWEL PROBLEMS  UNUSUAL RASH Items with * indicate a potential emergency and should be followed up as soon as possible.  Feel free to call the clinic should you have any questions or concerns. The clinic phone number is [phone].  Please show the CHEMO ALERT CARD at check-in to the Emergency Department and triage nurse.  Pembrolizumab injection What is this medicine? PEMBROLIZUMAB (pem broe liz ue mab) is a monoclonal antibody. It is used to treat melanoma, head and neck cancer, Hodgkin lymphoma, non-small cell lung cancer, urothelial cancer, stomach cancer, and cancers that have a certain genetic condition. This medicine may be used for other purposes; ask your health care provider or pharmacist if you have questions. COMMON BRAND NAME(S): Keytruda What should I tell my health care provider before I take this medicine? They need to know if you have any of these conditions: -diabetes -immune system problems -inflammatory bowel disease -liver disease -lung or breathing disease -lupus -organ transplant -an unusual or allergic reaction to pembrolizumab, other medicines, foods, dyes, or preservatives -pregnant or trying to get  pregnant -breast-feeding How should I use this medicine? This medicine is for infusion into a vein. It is given by a health care professional in a hospital or clinic setting. A special MedGuide will be given to you before each treatment. Be sure to read this information carefully each time. Talk to your pediatrician regarding the use of this medicine in children. While this drug may be prescribed for selected conditions, precautions do apply. Overdosage: If you think you have taken too much of this medicine contact a poison control center or emergency room at once. NOTE: This medicine is only for you. Do not share this medicine with others. What if I miss a dose? It is important not to miss your dose. Call your doctor or health care professional if you are unable to keep an appointment. What may interact with this medicine? Interactions have not been studied. Give your health care provider a list of all the medicines, herbs, non-prescription drugs, or dietary supplements you use. Also tell them if you smoke, drink alcohol, or use illegal drugs. Some items may interact with your medicine. This list may not describe all possible interactions. Give your health care provider a list of all the medicines, herbs, non-prescription drugs, or dietary supplements you use. Also tell them if you smoke, drink alcohol, or use illegal drugs. Some items may interact with your medicine. What should I watch for while using this medicine? Your condition will be monitored carefully while you are receiving this medicine. You may need blood work done while you are taking this medicine. Do not become pregnant while taking this medicine or for 4 months after stopping it. Women should inform their doctor if they wish to become pregnant or think   they might be pregnant. There is a potential for serious side effects to an unborn child. Talk to your health care professional or pharmacist for more information. Do not breast-feed  an infant while taking this medicine or for 4 months after the last dose. What side effects may I notice from receiving this medicine? Side effects that you should report to your doctor or health care professional as soon as possible: -allergic reactions like skin rash, itching or hives, swelling of the face, lips, or tongue -bloody or black, tarry -breathing problems -changes in vision -chest pain -chills -constipation -cough -dizziness or feeling faint or lightheaded -fast or irregular heartbeat -fever -flushing -hair loss -low blood counts - this medicine may decrease the number of white blood cells, red blood cells and platelets. You may be at increased risk for infections and bleeding. -muscle pain -muscle weakness -persistent headache -signs and symptoms of high blood sugar such as dizziness; dry mouth; dry skin; fruity breath; nausea; stomach pain; increased hunger or thirst; increased urination -signs and symptoms of kidney injury like trouble passing urine or change in the amount of urine -signs and symptoms of liver injury like dark urine, light-colored stools, loss of appetite, nausea, right upper belly pain, yellowing of the eyes or skin -stomach pain -sweating -weight loss Side effects that usually do not require medical attention (report to your doctor or health care professional if they continue or are bothersome): -decreased appetite -diarrhea -tiredness This list may not describe all possible side effects. Call your doctor for medical advice about side effects. You may report side effects to FDA at 1-800-FDA-1088. Where should I keep my medicine? This drug is given in a hospital or clinic and will not be stored at home. NOTE: This sheet is a summary. It may not cover all possible information. If you have questions about this medicine, talk to your doctor, pharmacist, or health care provider.  2018 Elsevier/Gold Standard (2016-05-11 12:29:36)  

## 2018-06-22 ENCOUNTER — Telehealth: Payer: Self-pay | Admitting: *Deleted

## 2018-06-22 NOTE — Telephone Encounter (Signed)
Spoke with patient. [place] yesterday. States he is doing fine. Eating and drinking well. Denies n/v diarrhea or rash. Patient and wife know to call for any issues

## 2018-06-27 ENCOUNTER — Ambulatory Visit: Payer: Medicare Other | Admitting: Infectious Diseases

## 2018-06-27 ENCOUNTER — Ambulatory Visit (INDEPENDENT_AMBULATORY_CARE_PROVIDER_SITE_OTHER): Payer: Medicare Other | Admitting: Orthopaedic Surgery

## 2018-06-27 ENCOUNTER — Telehealth: Payer: Self-pay

## 2018-06-27 ENCOUNTER — Encounter (INDEPENDENT_AMBULATORY_CARE_PROVIDER_SITE_OTHER): Payer: Self-pay | Admitting: Orthopaedic Surgery

## 2018-06-27 DIAGNOSIS — C7951 Secondary malignant neoplasm of bone: Secondary | ICD-10-CM | POA: Diagnosis not present

## 2018-06-27 NOTE — Progress Notes (Signed)
Office Visit Note   Patient: [name]           Date of Birth: 1941/05/25           MRN: [id number] Visit Date: 06/27/2018              Requested by: [name], MD [place], [place] [address] PCP: [name], MD   Assessment & Plan: Visit Diagnoses:  1. Bone metastasis ([place])     Plan: Impression is metastatic disease to the right inferior pubic ramus with soft tissue extension.  There is a pathologic fracture.  Given the nature of the disease process patient will need to consult with an orthopedic surgeon who specializes and musculoskeletal oncology such as Dr. Redmond Pulling or [place] at [facility].  We have made a referral today for the patient.  Questions encouraged and answered.  Follow-up as needed.  Follow-Up Instructions: Return if symptoms worsen or fail to improve.   Orders:  Orders Placed This Encounter  Procedures  . Ambulatory referral to Oncology   No orders of the defined types were placed in this encounter.     Procedures: No procedures performed   Clinical Data: No additional findings.   Subjective: Chief Complaint  Patient presents with  . Right Hip - Pain    [name] is a 77 year old gentleman who comes in with severe right hip and pelvic pain.  He has known primary bladder cancer with metastatic disease to his right pubic inferior ramus and lungs.  He has been referred here for evaluation and treatment for his pathologic fracture of his right inferior pubic ramus fracture.  He is unable to ambulate due to the pain.  He is currently ambulating in a wheelchair and takes Dilaudid, fentanyl, oxycodone due to the pain.   Review of Systems  Constitutional: Negative.   All other systems reviewed and are negative.    Objective: Vital Signs: There were no vitals taken for this visit.  Physical Exam  Constitutional: He is oriented to person, place, and time. He appears well-developed and well-nourished.  HENT:    Head: Normocephalic and atraumatic.  Eyes: Pupils are equal, round, and reactive to light.  Neck: Neck supple.  Pulmonary/Chest: Effort normal.  Abdominal: Soft.  Musculoskeletal: Normal range of motion.  Neurological: He is alert and oriented to person, place, and time.  Skin: Skin is warm.  Psychiatric: He has a normal mood and affect. His behavior is normal. Judgment and thought content normal.  Nursing note and vitals reviewed.   Ortho Exam His right hip exam shows moderate discomfort with hip range of motion.  He is very tender to the initial tuberosity.  No neurovascular compromise to the right lower extremity. Specialty Comments:  No specialty comments available.  Imaging: No results found.   PMFS History: Patient Active Problem List   Diagnosis Date Noted  . Redness and swelling of thigh 06/05/2018  . Adverse drug reaction 06/05/2018  . Bacteremia associated with intravascular line ([place]) 04/20/2018  . Febrile neutropenia ([place]) 04/13/2018  . Gram positive sepsis ([place]) 04/13/2018  . SIRS (systemic inflammatory response syndrome) ([place]) 04/12/2018  . Bacteremia due to Gram-positive bacteria 04/12/2018  . Pancytopenia ([place]) 04/12/2018  . Status post chemotherapy   . UTI (urinary tract infection) 04/11/2018  . Port-A-Cath in place 02/20/2018  . Encounter for antineoplastic chemotherapy 02/17/2018  . Sepsis due to urinary tract infection ([place]) 01/25/2018  . Sepsis secondary to UTI ([place]) 01/25/2018  .  Hyperglycemia   . Goals of care, counseling/discussion 01/18/2018  . Bone metastasis ([place]) 12/29/2017  . Status post ileal conduit ([place]) 06/29/2017  . Bladder cancer ([place]) 06/28/2017  . Malignant neoplasm of urinary bladder ([employer]) 04/20/2017  . COPD UNSPECIFIED 03/07/2008   Past Medical History:  Diagnosis Date  . Bladder cancer ([place])   . BPH (benign prostatic hypertrophy)   . Dysuria   . GERD (gastroesophageal reflux disease)   . History of adenomatous polyp of  colon    tubular adenoma 06/ 2018  . History of bladder cancer 12/2014   urologist-  dr [name]--  dx High Grade TCC without stromal invasion s/p TURBT and intravesical BCG tx/  recurrent 07/2015 (pTa) TCC  repear intravesical BCG tx   . History of hiatal hernia   . History of urethral stricture   . Nocturia     Family History  Problem Relation Age of Onset  . Alcoholism Father   . Colon cancer Neg Hx   . Colon polyps Neg Hx   . Diabetes Neg Hx   . Kidney disease Neg Hx   . Esophageal cancer Neg Hx   . Heart disease Neg Hx   . Gallbladder disease Neg Hx     Past Surgical History:  Procedure Laterality Date  . CARDIAC CATHETERIZATION  1997   normal coronary arteries  . CARDIOVASCULAR STRESS TEST  12-14-2005   Low risk perfusion study/  very small scar in the inferior septum from mid ventricle to apex,  no ischemia/  mild inferior septal hypokinesis/  ef 58%  . CATARACT EXTRACTION W/ INTRAOCULAR LENS  IMPLANT, BILATERAL  2011  . COLONOSCOPY  last one 06/ 2018  . CYSTOSCOPY WITH BIOPSY N/A 08/12/2015   Procedure: CYSTOSCOPY WITH BIOPSY AND FULGERATION;  Surgeon: [name], MD;  Location[facility];  Service: Urology;  Laterality: N/A;  . CYSTOSCOPY WITH INJECTION N/A 06/29/2017   Procedure: CYSTOSCOPY WITH INJECTION OF INDOCYANINE GREEN DYE;  Surgeon: Alexis Frock, MD;  Location: [facility];  Service: Urology;  Laterality: N/A;  . CYSTOSCOPY WITH URETHRAL DILATATION N/A 03/21/2017   Procedure: CYSTOSCOPY;  Surgeon: [name], MD;  Location: [facility];  Service: Urology;  Laterality: N/A;  . ESOPHAGOGASTRODUODENOSCOPY  12-05-2014  . IR IMAGING GUIDED PORT INSERTION  02/07/2018  . IR REMOVAL TUN ACCESS W/ PORT W/O FL MOD SED  04/14/2018  . TRANSTHORACIC ECHOCARDIOGRAM  01-31-2008   nomral LVF,  ef 55-65%/  mild pulmonic stenosis without regurg.,  peak transpulomonic valve gradient 38mmHg  . TRANSURETHRAL RESECTION OF BLADDER TUMOR N/A  12/31/2014   Procedure: TRANSURETHRAL RESECTION OF BLADDER TUMOR (TURBT);  Surgeon: Lowella Bandy, MD;  Location[facility];  Service: Urology;  Laterality: N/A;  . TRANSURETHRAL RESECTION OF BLADDER TUMOR N/A 03/21/2017   Procedure: POSSIBLE TRANSURETHRAL RESECTION OF BLADDER TUMOR (TURBT);  Surgeon: [name], MD;  Location: [facility];  Service: Urology;  Laterality: N/A;   Social History   Occupational History  . Occupation: Retired  Tobacco Use  . Smoking status: Former Smoker    Packs/day: 1.00    Years: 34.00    Pack years: 34.00    Types: Cigarettes    Last attempt to quit: 12/26/1988    Years since quitting: 29.5  . Smokeless tobacco: Never Used  Substance and Sexual Activity  . Alcohol use: Yes    Alcohol/week: 6.0 standard drinks    Types: 6 Cans of beer per week  Comment: BEER  . Drug use: No  . Sexual activity: Never

## 2018-06-27 NOTE — Telephone Encounter (Signed)
Per 11/27 appointment need to be readjusted due to infusion time was approved for 2:45 pm. After talking with Shadad. I Levada Dy) was able to move his f/u an lab appointment closer to the infusion time. Per 11/12 follow up from  [facility].

## 2018-06-28 ENCOUNTER — Other Ambulatory Visit: Payer: Self-pay | Admitting: Oncology

## 2018-06-28 MED ORDER — HYDROMORPHONE HCL 4 MG PO TABS: 4.0000 mg | ORAL_TABLET | ORAL | 0 refills | Status: DC | PRN

## 2018-06-28 MED ORDER — OXYCODONE HCL 5 MG PO TABS: 5.0000 mg | ORAL_TABLET | ORAL | 0 refills | Status: DC | PRN

## 2018-07-10 ENCOUNTER — Ambulatory Visit: Payer: Medicare Other

## 2018-07-10 DIAGNOSIS — Z8551 Personal history of malignant neoplasm of bladder: Secondary | ICD-10-CM | POA: Diagnosis not present

## 2018-07-10 DIAGNOSIS — C7951 Secondary malignant neoplasm of bone: Secondary | ICD-10-CM | POA: Diagnosis not present

## 2018-07-10 DIAGNOSIS — Z87891 Personal history of nicotine dependence: Secondary | ICD-10-CM | POA: Diagnosis not present

## 2018-07-10 DIAGNOSIS — Z881 Allergy status to other antibiotic agents status: Secondary | ICD-10-CM | POA: Diagnosis not present

## 2018-07-10 DIAGNOSIS — Z936 Other artificial openings of urinary tract status: Secondary | ICD-10-CM | POA: Diagnosis not present

## 2018-07-11 ENCOUNTER — Other Ambulatory Visit: Payer: Self-pay | Admitting: Oncology

## 2018-07-11 ENCOUNTER — Telehealth: Payer: Self-pay

## 2018-07-11 MED ORDER — HYDROMORPHONE HCL 4 MG PO TABS: 4.0000 mg | ORAL_TABLET | ORAL | 0 refills | Status: DC | PRN

## 2018-07-11 MED ORDER — OXYCODONE HCL 5 MG PO TABS: 5.0000 mg | ORAL_TABLET | ORAL | 0 refills | Status: DC | PRN

## 2018-07-11 MED ORDER — FENTANYL 50 MCG/HR TD PT72: 50.0000 ug | MEDICATED_PATCH | TRANSDERMAL | 0 refills | Status: DC

## 2018-07-12 ENCOUNTER — Inpatient Hospital Stay (HOSPITAL_BASED_OUTPATIENT_CLINIC_OR_DEPARTMENT_OTHER): Payer: Medicare Other | Admitting: Oncology

## 2018-07-12 ENCOUNTER — Ambulatory Visit: Payer: Medicare Other | Admitting: Oncology

## 2018-07-12 ENCOUNTER — Inpatient Hospital Stay: Payer: Medicare Other

## 2018-07-12 ENCOUNTER — Telehealth: Payer: Self-pay | Admitting: Oncology

## 2018-07-12 ENCOUNTER — Other Ambulatory Visit: Payer: Medicare Other

## 2018-07-12 VITALS — BP 139/91 | HR 100 | Temp 97.7°F | Resp 18 | Ht 71.0 in | Wt 192.6 lb

## 2018-07-12 DIAGNOSIS — C679 Malignant neoplasm of bladder, unspecified: Secondary | ICD-10-CM

## 2018-07-12 DIAGNOSIS — Z923 Personal history of irradiation: Secondary | ICD-10-CM

## 2018-07-12 DIAGNOSIS — Z79899 Other long term (current) drug therapy: Secondary | ICD-10-CM

## 2018-07-12 DIAGNOSIS — C7800 Secondary malignant neoplasm of unspecified lung: Secondary | ICD-10-CM

## 2018-07-12 DIAGNOSIS — Z9221 Personal history of antineoplastic chemotherapy: Secondary | ICD-10-CM

## 2018-07-12 DIAGNOSIS — C7951 Secondary malignant neoplasm of bone: Secondary | ICD-10-CM

## 2018-07-12 DIAGNOSIS — Z5112 Encounter for antineoplastic immunotherapy: Secondary | ICD-10-CM | POA: Diagnosis not present

## 2018-07-12 LAB — CBC WITH DIFFERENTIAL (CANCER CENTER ONLY)
Abs Immature Granulocytes: 0.04 10*3/uL (ref 0.00–0.07)
Basophils Absolute: 0.1 10*3/uL (ref 0.0–0.1)
Basophils Relative: 1 %
Eosinophils Absolute: 0.1 10*3/uL (ref 0.0–0.5)
Eosinophils Relative: 2 %
HCT: 39.3 % (ref 39.0–52.0)
Hemoglobin: 12.4 g/dL — ABNORMAL LOW (ref 13.0–17.0)
Immature Granulocytes: 1 %
Lymphocytes Relative: 11 %
Lymphs Abs: 0.8 10*3/uL (ref 0.7–4.0)
MCH: 32.5 pg (ref 26.0–34.0)
MCHC: 31.6 g/dL (ref 30.0–36.0)
MCV: 102.9 fL — ABNORMAL HIGH (ref 80.0–100.0)
Monocytes Absolute: 1.4 10*3/uL — ABNORMAL HIGH (ref 0.1–1.0)
Monocytes Relative: 17 %
Neutro Abs: 5.5 10*3/uL (ref 1.7–7.7)
Neutrophils Relative %: 68 %
Platelet Count: 284 10*3/uL (ref 150–400)
RBC: 3.82 MIL/uL — ABNORMAL LOW (ref 4.22–5.81)
RDW: 14.2 % (ref 11.5–15.5)
WBC Count: 7.9 10*3/uL (ref 4.0–10.5)
nRBC: 0 % (ref 0.0–0.2)

## 2018-07-12 LAB — CMP (CANCER CENTER ONLY)
ALT: 21 U/L (ref 0–44)
AST: 22 U/L (ref 15–41)
Albumin: 2.6 g/dL — ABNORMAL LOW (ref 3.5–5.0)
Alkaline Phosphatase: 65 U/L (ref 38–126)
Anion gap: 8 (ref 5–15)
BUN: 13 mg/dL (ref 8–23)
CO2: 28 mmol/L (ref 22–32)
Calcium: 9.3 mg/dL (ref 8.9–10.3)
Chloride: 103 mmol/L (ref 98–111)
Creatinine: 0.77 mg/dL (ref 0.61–1.24)
GFR, Est AFR Am: >60 mL/min (ref >60)
GFR, Estimated: >60 mL/min (ref >60)
Glucose, Bld: 102 mg/dL — ABNORMAL HIGH (ref 70–99)
Potassium: 3.9 mmol/L (ref 3.5–5.1)
Sodium: 139 mmol/L (ref 135–145)
Total Bilirubin: 0.7 mg/dL (ref 0.3–1.2)
Total Protein: 6 g/dL — ABNORMAL LOW (ref 6.5–8.1)

## 2018-07-12 LAB — TSH: TSH: 2.925 u[IU]/mL (ref 0.320–4.118)

## 2018-07-12 MED ORDER — DIPHENHYDRAMINE HCL 50 MG/ML IJ SOLN
INTRAMUSCULAR | Status: AC
  Filled 2018-07-12: qty 1

## 2018-07-12 MED ORDER — DIPHENHYDRAMINE HCL 50 MG/ML IJ SOLN
25.0000 mg | Freq: Once | INTRAMUSCULAR | Status: AC
  Administered 2018-07-12: 25 mg via INTRAVENOUS

## 2018-07-12 MED ORDER — SODIUM CHLORIDE 0.9 % IV SOLN
Freq: Once | INTRAVENOUS | Status: AC
  Administered 2018-07-12: 14:00:00 via INTRAVENOUS
  Filled 2018-07-12: qty 250

## 2018-07-12 MED ORDER — HEPARIN SOD (PORK) LOCK FLUSH 100 UNIT/ML IV SOLN
250.0000 [IU] | Freq: Once | INTRAVENOUS | Status: DC | PRN
  Filled 2018-07-12: qty 5

## 2018-07-12 MED ORDER — SODIUM CHLORIDE 0.9% FLUSH
10.0000 mL | INTRAVENOUS | Status: DC | PRN
  Filled 2018-07-12: qty 10

## 2018-07-12 MED ORDER — SODIUM CHLORIDE 0.9 % IV SOLN
200.0000 mg | Freq: Once | INTRAVENOUS | Status: AC
  Administered 2018-07-12: 200 mg via INTRAVENOUS
  Filled 2018-07-12: qty 8

## 2018-07-12 NOTE — Progress Notes (Signed)
Hematology and Oncology Follow Up Visit  [name] [id number] Dec 19, 1940 77 y.o. 07/12/2018 12:24 PM [name], MDFusco, Purcell Nails, MD   Principle Diagnosis: 77 year old man with stage IV bladder cancer with lung and pelvic metastasis confirmed and in 2019.  His tumor is PDL 1 positive  with CPS score over 90%.  His original tumor diagnosed in 2018 with localized tumor.  Prior Therapy:   He is status post neoadjuvant chemotherapy utilizing gemcitabine and cisplatin started in September 2018.  He received day 1 of cycle 1 on 05/02/2017 and therapy discontinued after that.  He is status post laparoscopic Cystoprostatectomy and bilateral lymphadenectomy done by Dr. Tresa Moore on 06/29/2017. Final pathology showed a T3bN0 disease.  He has 13 lymph nodes sampled without any evidence of malignancy.  He completed radiation therapy to the pelvis on January 16, 2018 under the care of Dr. Tammi Klippel.   Carboplatin and gemcitabine he is status post 6 cycles of therapy completed on 05/22/2018.   Current therapy: Pembrolizumab 200 mg every 3 weeks started on 06/21/2018.  Interim History: [name] returns today for a repeat evaluation.  Since the last visit, he reports no major changes in his health.  He received Pembrolizumab without any major complications.  He does report some mild pruritus which has resolved at this time.  He denies any skin rash or lesions.  He denies any respiratory complaints or major changes in his bowel habits.  Continues to have chronic hip pain and currently on fentanyl and oral short acting pain medication as well.  He was evaluated by Dr. Warren Lacy at [facility] for possible surgical intervention.  His mobility is limited but still ambulates short distances.  He does not report any headaches, blurry vision, syncope or seizures.  He denies any dizziness or lethargy.  He does not report any fevers, chills or sweats. He does not report any cough, wheezing or  dyspnea on exertion. He does not report any chest pain, palpitation, orthopnea or leg edema.  He does not report any nausea, vomiting.  He denies any constipation or diarrhea.  He denied lymphadenopathy or petechiae.  He does not report any frequency urgency or hesitancy.he does not report any mood changes. He denies any skin rashes or lesions.  He denies any lymphadenopathy or petechiae.  Remaining review of systems is negative.  Medications: I have reviewed the patient's current medications.  Current Outpatient Medications  Medication Sig Dispense Refill  . acetaminophen (TYLENOL) 500 MG tablet Take 1,000 mg by mouth every 6 (six) hours as needed for fever.     . diphenhydrAMINE (BENADRYL) 25 MG tablet Take 25 mg by mouth every 6 (six) hours as needed for itching.    . feeding supplement, ENSURE ENLIVE, (ENSURE ENLIVE) LIQD Take 237 mLs by mouth 2 (two) times daily between meals. 237 mL 12  . fentaNYL (DURAGESIC - DOSED MCG/HR) 25 MCG/HR patch Place 1 patch (25 mcg total) onto the skin every 3 (three) days. 5 patch 0  . fentaNYL (DURAGESIC - DOSED MCG/HR) 50 MCG/HR Place 1 patch (50 mcg total) onto the skin every 3 (three) days. 10 patch 0  . HYDROmorphone (DILAUDID) 4 MG tablet Take 1 tablet (4 mg total) by mouth every 4 (four) hours as needed for severe pain. 60 tablet 0  . hydrOXYzine (ATARAX/VISTARIL) 50 MG tablet Take 1 tablet (50 mg total) by mouth 4 (four) times daily as needed. 30 tablet 0  . lactose free nutrition (BOOST) LIQD Take 237  mLs by mouth 3 (three) times daily between meals.    Marland Kitchen oxyCODONE (OXY IR/ROXICODONE) 5 MG immediate release tablet Take 1-2 tablets (5-10 mg total) by mouth every 4 (four) hours as needed for severe pain. 60 tablet 0  . polyethylene glycol (MIRALAX / GLYCOLAX) packet Take 17 g by mouth daily. 14 each 0  . prochlorperazine (COMPAZINE) 10 MG tablet Take 1 tablet (10 mg total) by mouth every 6 (six) hours as needed for nausea or vomiting. 30 tablet 0   No  current facility-administered medications for this visit.      Allergies:  Allergies  Allergen Reactions  . Ciprofloxacin Hives  . Cefepime Rash    Past Medical History, Surgical history, Social history, and Family History were reviewed and updated.  Physical Exam:  Blood pressure (!) 139/91, pulse 100, temperature 97.7 F (36.5 C), temperature source Oral, resp. rate 18, height 5\' 11"  (1.803 m), weight 192 lb 9.6 oz (87.4 kg), SpO2 97 %.     ECOG: 1     General appearance: Alert, awake without any distress. Head: Atraumatic without abnormalities Oropharynx: Without any thrush or ulcers. Eyes: No scleral icterus. Lymph nodes: No lymphadenopathy noted in the cervical, supraclavicular, or axillary nodes Heart:regular rate and rhythm, without any murmurs or gallops.   Lung: Clear to auscultation without any rhonchi, wheezes or dullness to percussion. Abdomin: Soft, nontender without any shifting dullness or ascites. Musculoskeletal: No clubbing or cyanosis. Neurological: No motor or sensory deficits. Skin: No rashes or lesions. Psychiatric: Mood and affect appeared normal.        Lab Results: Lab Results  Component Value Date   WBC 7.9 07/12/2018   HGB 12.4 (L) 07/12/2018   HCT 39.3 07/12/2018   MCV 102.9 (H) 07/12/2018   PLT 284 07/12/2018     Chemistry      Component Value Date/Time   NA 142 06/21/2018 0847   NA 141 05/23/2017 0828   K 3.8 06/21/2018 0847   K 4.0 05/23/2017 0828   CL 108 06/21/2018 0847   CO2 25 06/21/2018 0847   CO2 27 05/23/2017 0828   BUN 7 (L) 06/21/2018 0847   BUN 12.5 05/23/2017 0828   CREATININE 0.81 06/21/2018 0847   CREATININE 0.8 05/23/2017 0828      Component Value Date/Time   CALCIUM 8.6 (L) 06/21/2018 0847   CALCIUM 9.0 05/23/2017 0828   ALKPHOS 73 06/21/2018 0847   ALKPHOS 57 05/23/2017 0828   AST 17 06/21/2018 0847   AST 13 05/23/2017 0828   ALT 12 06/21/2018 0847   ALT 15 05/23/2017 0828   BILITOT 0.2 (L)  06/21/2018 0847   BILITOT 0.37 05/23/2017 0828       Impression and Plan:  77 year old man with:   1.  Stage IV bladder cancer with documented metastatic disease to the lung and the right hip.  He has progressed on therapy as outlined above and currently receiving Pembrolizumab at started 3 weeks ago.  Risks and benefits of continuing this therapy was reviewed today and he is agreeable to continue.  The plan is to repeat imaging studies after 3-4 cycles.  2. Hip pain: Continues to have issues despite fentanyl patch and oral analgesia.  He is using both Dilaudid and oxycodone for breakthrough pain medication.  He has been evaluated by Dr. Warren Lacy at [facility] and his options of treatment include nerve block versus radiofrequency ablation to alleviate the pain.  He is considering these options at this time.  3.  Goals of care: Therapy remains palliative at this time and his disease is incurable.  His performance status remains adequate and aggressive measures are warranted.  4.  IV access: No need for Port-A-Cath at this time peripheral veins are currently in use.  5.  Antiemetics: Prescription for Compazine is available to him.  No nausea or vomiting reported at this time.  6.  Immune mediated side effects: None reported at this time.  We will continue to monitor his thyroid function and educate him about potential complications which include pneumonitis, colitis, arthritis among others.  7.  Follow-up: Will be in 2 weeks for the next cycle of therapy.  25  minutes was spent with the patient face-to-face today.  More than 50% of time was dedicated to reviewing his disease status, addressing complications related to therapy and answering questions regarding plan of care.    Zola Button, MD 11/27/201912:24 PM

## 2018-07-12 NOTE — Telephone Encounter (Signed)
Printed calendar and avs. °

## 2018-07-12 NOTE — Patient Instructions (Signed)
[facility] Discharge Instructions for Patients Receiving Chemotherapy  Today you received the following chemotherapy agents: Pembrolizumab (Keytruda)  To help prevent nausea and vomiting after your treatment, we encourage you to take your nausea medication as prescribed.    If you develop nausea and vomiting that is not controlled by your nausea medication, call the clinic.   BELOW ARE SYMPTOMS THAT SHOULD BE REPORTED IMMEDIATELY:  *FEVER GREATER THAN 100.5 F  *CHILLS WITH OR WITHOUT FEVER  NAUSEA AND VOMITING THAT IS NOT CONTROLLED WITH YOUR NAUSEA MEDICATION  *UNUSUAL SHORTNESS OF BREATH  *UNUSUAL BRUISING OR BLEEDING  TENDERNESS IN MOUTH AND THROAT WITH OR WITHOUT PRESENCE OF ULCERS  *URINARY PROBLEMS  *BOWEL PROBLEMS  UNUSUAL RASH Items with * indicate a potential emergency and should be followed up as soon as possible.  Feel free to call the clinic should you have any questions or concerns. The clinic phone number is [phone].  Please show the CHEMO ALERT CARD at check-in to the Emergency Department and triage nurse.  Pembrolizumab injection What is this medicine? PEMBROLIZUMAB (pem broe liz ue mab) is a monoclonal antibody. It is used to treat melanoma, head and neck cancer, Hodgkin lymphoma, non-small cell lung cancer, urothelial cancer, stomach cancer, and cancers that have a certain genetic condition. This medicine may be used for other purposes; ask your health care provider or pharmacist if you have questions. COMMON BRAND NAME(S): Keytruda What should I tell my health care provider before I take this medicine? They need to know if you have any of these conditions: -diabetes -immune system problems -inflammatory bowel disease -liver disease -lung or breathing disease -lupus -organ transplant -an unusual or allergic reaction to pembrolizumab, other medicines, foods, dyes, or preservatives -pregnant or trying to get  pregnant -breast-feeding How should I use this medicine? This medicine is for infusion into a vein. It is given by a health care professional in a hospital or clinic setting. A special MedGuide will be given to you before each treatment. Be sure to read this information carefully each time. Talk to your pediatrician regarding the use of this medicine in children. While this drug may be prescribed for selected conditions, precautions do apply. Overdosage: If you think you have taken too much of this medicine contact a poison control center or emergency room at once. NOTE: This medicine is only for you. Do not share this medicine with others. What if I miss a dose? It is important not to miss your dose. Call your doctor or health care professional if you are unable to keep an appointment. What may interact with this medicine? Interactions have not been studied. Give your health care provider a list of all the medicines, herbs, non-prescription drugs, or dietary supplements you use. Also tell them if you smoke, drink alcohol, or use illegal drugs. Some items may interact with your medicine. This list may not describe all possible interactions. Give your health care provider a list of all the medicines, herbs, non-prescription drugs, or dietary supplements you use. Also tell them if you smoke, drink alcohol, or use illegal drugs. Some items may interact with your medicine. What should I watch for while using this medicine? Your condition will be monitored carefully while you are receiving this medicine. You may need blood work done while you are taking this medicine. Do not become pregnant while taking this medicine or for 4 months after stopping it. Women should inform their doctor if they wish to become pregnant or think   they might be pregnant. There is a potential for serious side effects to an unborn child. Talk to your health care professional or pharmacist for more information. Do not breast-feed  an infant while taking this medicine or for 4 months after the last dose. What side effects may I notice from receiving this medicine? Side effects that you should report to your doctor or health care professional as soon as possible: -allergic reactions like skin rash, itching or hives, swelling of the face, lips, or tongue -bloody or black, tarry -breathing problems -changes in vision -chest pain -chills -constipation -cough -dizziness or feeling faint or lightheaded -fast or irregular heartbeat -fever -flushing -hair loss -low blood counts - this medicine may decrease the number of white blood cells, red blood cells and platelets. You may be at increased risk for infections and bleeding. -muscle pain -muscle weakness -persistent headache -signs and symptoms of high blood sugar such as dizziness; dry mouth; dry skin; fruity breath; nausea; stomach pain; increased hunger or thirst; increased urination -signs and symptoms of kidney injury like trouble passing urine or change in the amount of urine -signs and symptoms of liver injury like dark urine, light-colored stools, loss of appetite, nausea, right upper belly pain, yellowing of the eyes or skin -stomach pain -sweating -weight loss Side effects that usually do not require medical attention (report to your doctor or health care professional if they continue or are bothersome): -decreased appetite -diarrhea -tiredness This list may not describe all possible side effects. Call your doctor for medical advice about side effects. You may report side effects to FDA at 1-800-FDA-1088. Where should I keep my medicine? This drug is given in a hospital or clinic and will not be stored at home. NOTE: This sheet is a summary. It may not cover all possible information. If you have questions about this medicine, talk to your doctor, pharmacist, or health care provider.  2018 Elsevier/Gold Standard (2016-05-11 12:29:36)  

## 2018-07-24 ENCOUNTER — Telehealth: Payer: Self-pay

## 2018-07-24 ENCOUNTER — Other Ambulatory Visit: Payer: Self-pay | Admitting: Oncology

## 2018-07-24 MED ORDER — OXYCODONE HCL 5 MG PO TABS: 5.0000 mg | ORAL_TABLET | ORAL | 0 refills | Status: DC | PRN

## 2018-07-24 MED ORDER — HYDROMORPHONE HCL 4 MG PO TABS: 4.0000 mg | ORAL_TABLET | ORAL | 0 refills | Status: DC | PRN

## 2018-07-24 NOTE — Telephone Encounter (Signed)
Received a message from the patient daughter Lattie Haw stating that the patient has had a fever all weekend and treating with Tylenol. Contacted the patient and spoke with spouse. She stated that the patient had temps ranging from 100.7 to 101.7 Sat and Sunday and she was treating with Tylenol, the patient was diaphoretic with chills and shaking. She stated that he vomited this morning at 3 am x 2 but now states he is feeling better. Spoke with the patient and he stated that he is feeling much better this morning and does not feel that he needs to be seen in [facility]. Explained that if anything changes and he starts with fever, chills, or vomiting to call back. Patient and spouse verbalized understanding and had no other questions or concerns.

## 2018-07-26 ENCOUNTER — Inpatient Hospital Stay: Payer: Medicare Other | Attending: Oncology | Admitting: Medical

## 2018-07-26 ENCOUNTER — Telehealth: Payer: Self-pay | Admitting: Emergency Medicine

## 2018-07-26 ENCOUNTER — Ambulatory Visit (HOSPITAL_COMMUNITY)
Admission: RE | Admit: 2018-07-26 | Discharge: 2018-07-26 | Disposition: A | Discharge status: Home / Self Care | Payer: Medicare Other | Source: Ambulatory Visit | Attending: Medical | Admitting: Medical

## 2018-07-26 ENCOUNTER — Other Ambulatory Visit: Payer: Self-pay | Admitting: Emergency Medicine

## 2018-07-26 ENCOUNTER — Inpatient Hospital Stay: Payer: Medicare Other | Admitting: Medical

## 2018-07-26 VITALS — BP 147/92 | HR 87 | Temp 98.4°F | Resp 18 | Ht 71.0 in | Wt 186.5 lb

## 2018-07-26 DIAGNOSIS — Z5112 Encounter for antineoplastic immunotherapy: Secondary | ICD-10-CM | POA: Insufficient documentation

## 2018-07-26 DIAGNOSIS — Z79899 Other long term (current) drug therapy: Secondary | ICD-10-CM | POA: Insufficient documentation

## 2018-07-26 DIAGNOSIS — Z923 Personal history of irradiation: Secondary | ICD-10-CM | POA: Insufficient documentation

## 2018-07-26 DIAGNOSIS — C7951 Secondary malignant neoplasm of bone: Secondary | ICD-10-CM | POA: Insufficient documentation

## 2018-07-26 DIAGNOSIS — J90 Pleural effusion, not elsewhere classified: Secondary | ICD-10-CM | POA: Diagnosis not present

## 2018-07-26 DIAGNOSIS — R509 Fever, unspecified: Secondary | ICD-10-CM

## 2018-07-26 DIAGNOSIS — Z9221 Personal history of antineoplastic chemotherapy: Secondary | ICD-10-CM | POA: Insufficient documentation

## 2018-07-26 DIAGNOSIS — C679 Malignant neoplasm of bladder, unspecified: Secondary | ICD-10-CM

## 2018-07-26 DIAGNOSIS — C7800 Secondary malignant neoplasm of unspecified lung: Secondary | ICD-10-CM | POA: Diagnosis not present

## 2018-07-26 LAB — CMP (CANCER CENTER ONLY)
ALT: 34 U/L (ref 0–44)
AST: 43 U/L — ABNORMAL HIGH (ref 15–41)
Albumin: 2.4 g/dL — ABNORMAL LOW (ref 3.5–5.0)
Alkaline Phosphatase: 64 U/L (ref 38–126)
Anion gap: 11 (ref 5–15)
BUN: 15 mg/dL (ref 8–23)
CO2: 29 mmol/L (ref 22–32)
Calcium: 9.3 mg/dL (ref 8.9–10.3)
Chloride: 97 mmol/L — ABNORMAL LOW (ref 98–111)
Creatinine: 0.8 mg/dL (ref 0.61–1.24)
GFR, Est AFR Am: >60 mL/min (ref >60)
GFR, Estimated: >60 mL/min (ref >60)
Glucose, Bld: 104 mg/dL — ABNORMAL HIGH (ref 70–99)
Potassium: 3.9 mmol/L (ref 3.5–5.1)
Sodium: 137 mmol/L (ref 135–145)
Total Bilirubin: 0.4 mg/dL (ref 0.3–1.2)
Total Protein: 6.2 g/dL — ABNORMAL LOW (ref 6.5–8.1)

## 2018-07-26 LAB — URINALYSIS, COMPLETE (UACMP) WITH MICROSCOPIC
Bilirubin Urine: NEGATIVE
Glucose, UA: NEGATIVE mg/dL
Hgb urine dipstick: NEGATIVE
Ketones, ur: NEGATIVE mg/dL
Nitrite: POSITIVE — AB
Protein, ur: NEGATIVE mg/dL
Specific Gravity, Urine: 1.012 (ref 1.005–1.030)
pH: 7 (ref 5.0–8.0)

## 2018-07-26 LAB — CBC WITH DIFFERENTIAL (CANCER CENTER ONLY)
Abs Immature Granulocytes: 0.02 10*3/uL (ref 0.00–0.07)
Basophils Absolute: 0 10*3/uL (ref 0.0–0.1)
Basophils Relative: 1 %
Eosinophils Absolute: 0.1 10*3/uL (ref 0.0–0.5)
Eosinophils Relative: 2 %
HCT: 36.8 % — ABNORMAL LOW (ref 39.0–52.0)
Hemoglobin: 11.7 g/dL — ABNORMAL LOW (ref 13.0–17.0)
Immature Granulocytes: 0 %
Lymphocytes Relative: 12 %
Lymphs Abs: 0.7 10*3/uL (ref 0.7–4.0)
MCH: 32.3 pg (ref 26.0–34.0)
MCHC: 31.8 g/dL (ref 30.0–36.0)
MCV: 101.7 fL — ABNORMAL HIGH (ref 80.0–100.0)
Monocytes Absolute: 1 10*3/uL (ref 0.1–1.0)
Monocytes Relative: 17 %
Neutro Abs: 4.1 10*3/uL (ref 1.7–7.7)
Neutrophils Relative %: 68 %
Platelet Count: 229 10*3/uL (ref 150–400)
RBC: 3.62 MIL/uL — ABNORMAL LOW (ref 4.22–5.81)
RDW: 13.2 % (ref 11.5–15.5)
WBC Count: 5.9 10*3/uL (ref 4.0–10.5)
nRBC: 0 % (ref 0.0–0.2)

## 2018-07-26 MED ORDER — SULFAMETHOXAZOLE-TRIMETHOPRIM 800-160 MG PO TABS: 1.0000 | ORAL_TABLET | Freq: Two times a day (BID) | ORAL | 0 refills | Status: DC

## 2018-07-26 NOTE — Progress Notes (Signed)
These results were called to [name]. A message was left for him. He was told to call if there were questions.  [name], MHS, PA-C

## 2018-07-26 NOTE — Patient Instructions (Signed)
Fever, Adult A fever is an increase in the body's temperature. It is often defined as a temperature of 100 F (38C) or higher. Short mild or moderate fevers often have no long-term effects. They also often do not need treatment. Moderate or high fevers may make you feel uncomfortable. Sometimes, they can also be a sign of a serious illness or disease. The sweating that may happen with repeated fevers or fevers that last a while may also cause you to not have enough fluid in your body (dehydration). You can take your temperature with a thermometer to see if you have a fever. A measured temperature can change with:  Age.  Time of day.  Where the thermometer is placed: ? Mouth (oral). ? Rectum (rectal). ? Ear (tympanic). ? Underarm (axillary). ? Forehead (temporal).  Follow these instructions at home: Pay attention to any changes in your symptoms. Take these actions to help with your condition:  Take over-the-counter and prescription medicines only as told by your doctor. Follow the dosing instructions carefully.  If you were prescribed an antibiotic medicine, take it as told by your doctor. Do not stop taking the antibiotic even if you start to feel better.  Rest as needed.  Drink enough fluid to keep your pee (urine) clear or pale yellow.  Sponge yourself or bathe with room-temperature water as needed. This helps to lower your body temperature . Do not use ice water.  Do not wear too many blankets or heavy clothes.  Contact a doctor if:  You throw up (vomit).  You cannot eat or drink without throwing up.  You have watery poop (diarrhea).  It hurts when you pee.  Your symptoms do not get better with treatment.  You have new symptoms.  You feel very weak. Get help right away if:  You are short of breath or have trouble breathing.  You are dizzy or you pass out (faint).  You feel confused.  You have signs of not having enough fluid in your body, such as: ? A dry  mouth. ? Peeing less. ? Looking pale.  You have very bad pain in your belly (abdomen).  You keep throwing up or having water poop.  You have a skin rash.  Your symptoms suddenly get worse. This information is not intended to replace advice given to you by your health care provider. Make sure you discuss any questions you have with your health care provider. Document Released: 05/11/2008 Document Revised: 01/08/2016 Document Reviewed: 09/26/2014 Elsevier Interactive Patient Education  2018 Elsevier Inc.  

## 2018-07-26 NOTE — Progress Notes (Signed)
Pt presents today with intermittent fevers since Saturday of last week.  States that he had a fever of 102 but took tylenol to relieve it.  Says this has happened several times over the last few days without any other symptoms or signs of infection.  The last two nights the patient has had soaking night sweats and chills.  Pt is currently afebrile, denies CP or SOB.  No rash present.  States "this used to happen sometimes when I was on chemo but it's only immunotherapy now".  Denies headache, dizziness, confusion, weakness, or falls.

## 2018-07-26 NOTE — Telephone Encounter (Signed)
Returning pt's VM, spoken with patient and his wife.  Pt has had fevers intermittently with tylenol use over last few days.  Currently afebrile.  Reports gen fatigue and profuse sweating at this time, states "something isn't right".  Pt advised to come in for [facility] for lab and assessment at noon.  Verbalized understanding.

## 2018-07-27 NOTE — Progress Notes (Signed)
[facility] Progress Note   [name] [id number] 03-10-1941 77 y.o.  [name] is managed by Dr. Zola Button  Actively treated with chemotherapy/immunotherapy/hormonal therapy: yes  Current Therapy: Keytruda  Last Treated: 07/12/18 (cycle 2 day 1)  Assessment: Plan:    Fever, unspecified fever cause - Plan: Urine Culture, Urinalysis, Complete w Microscopic, DG Chest 2 View  Malignant neoplasm of urinary bladder, unspecified site (HCC)   1) Fever: A urinalysis and urine culture were collected today.  The patient was referred for a CXR.  A CBC and a chemistry panel was collected today. The patient was given as prescription for Bactrim DS twice daily for seven days.  2) Metastatic bladder cancer: The patient continues to be followed by Dr. Alen Blew.  He is s/p Cycle 2 of Keytruda which was dosed on 07/12/18.  He will be seen for a follow up on 08/02/18.  Please see After Visit Summary for patient specific instructions.  Future Appointments  Date Time Provider [place]  08/02/2018  8:45 AM [facility] LAB 3 [facility] None  08/02/2018  9:15 AM Owens Shark, NP [facility] None  08/02/2018 10:15 AM [facility] INFUSION [facility] None  08/23/2018 12:30 PM [facility] LAB 5 [facility] None  08/23/2018  1:00 PM [name], MD [facility] None  08/23/2018  3:00 PM [facility] INFUSION [facility] None  08/23/2018  3:15 PM Karie Mainland, RD [facility] None    Orders Placed This Encounter  Procedures  . Urine Culture  . DG Chest 2 View  . Urinalysis, Complete w Microscopic       Subjective:   Patient ID:  [name] is a 77 y.o. (DOB [birth year]) male.  Chief Complaint:  Chief Complaint  Patient presents with  . Fever    HPI [name] is a 77 y.o. male with a history of metastatic bladder cancer who is managed by Dr. Alen Blew and is s/p cycle 2 day 1 of Keytruda which was dosed on 07/12/18.  He presents today with a report  of a fever of 102.7 on Saturday.  He took Tylenol with his temperature dropping to 99.0.  He has had no additional fever but has been sweating profusely.  He has chills and intermittent nausea w/vomiting.  He has a urostomy and has noted cloudiness of his urine with increased sedimentation.  He denies chest pain, SOB, headache, or dizziness.  Medications: I have reviewed the patient's current medications.  Allergies:  Allergies  Allergen Reactions  . Ciprofloxacin Hives  . Cefepime Rash    Past Medical History:  Diagnosis Date  . Bladder cancer ([place])   . BPH (benign prostatic hypertrophy)   . Dysuria   . GERD (gastroesophageal reflux disease)   . History of adenomatous polyp of colon    tubular adenoma 06/ 2018  . History of bladder cancer 12/2014   urologist-  dr [name]--  dx High Grade TCC without stromal invasion s/p TURBT and intravesical BCG tx/  recurrent 07/2015 (pTa) TCC  repear intravesical BCG tx   . History of hiatal hernia   . History of urethral stricture   . Nocturia     Past Surgical History:  Procedure Laterality Date  . CARDIAC CATHETERIZATION  1997   normal coronary arteries  . CARDIOVASCULAR STRESS TEST  12-14-2005   Low risk perfusion study/  very small scar in the inferior septum from mid ventricle to apex,  no ischemia/  mild inferior septal hypokinesis/  ef 58%  .  CATARACT EXTRACTION W/ INTRAOCULAR LENS  IMPLANT, BILATERAL  2011  . COLONOSCOPY  last one 06/ 2018  . CYSTOSCOPY WITH BIOPSY N/A 08/12/2015   Procedure: CYSTOSCOPY WITH BIOPSY AND FULGERATION;  Surgeon: [name], MD;  Location: [facility];  Service: Urology;  Laterality: N/A;  . CYSTOSCOPY WITH INJECTION N/A 06/29/2017   Procedure: CYSTOSCOPY WITH INJECTION OF INDOCYANINE GREEN DYE;  Surgeon: Alexis Frock, MD;  Location: [facility];  Service: Urology;  Laterality: N/A;  . CYSTOSCOPY WITH URETHRAL DILATATION N/A 03/21/2017   Procedure: CYSTOSCOPY;  Surgeon: [name], MD;  Location[facility];  Service: Urology;  Laterality: N/A;  . ESOPHAGOGASTRODUODENOSCOPY  12-05-2014  . IR IMAGING GUIDED PORT INSERTION  02/07/2018  . IR REMOVAL TUN ACCESS W/ PORT W/O FL MOD SED  04/14/2018  . TRANSTHORACIC ECHOCARDIOGRAM  01-31-2008   nomral LVF,  ef 55-65%/  mild pulmonic stenosis without regurg.,  peak transpulomonic valve gradient 3mmHg  . TRANSURETHRAL RESECTION OF BLADDER TUMOR N/A 12/31/2014   Procedure: TRANSURETHRAL RESECTION OF BLADDER TUMOR (TURBT);  Surgeon: Lowella Bandy, MD;  Location: [facility];  Service: Urology;  Laterality: N/A;  . TRANSURETHRAL RESECTION OF BLADDER TUMOR N/A 03/21/2017   Procedure: POSSIBLE TRANSURETHRAL RESECTION OF BLADDER TUMOR (TURBT);  Surgeon: [name], MD;  Location: [facility];  Service: Urology;  Laterality: N/A;    Family History  Problem Relation Age of Onset  . Alcoholism Father   . Colon cancer Neg Hx   . Colon polyps Neg Hx   . Diabetes Neg Hx   . Kidney disease Neg Hx   . Esophageal cancer Neg Hx   . Heart disease Neg Hx   . Gallbladder disease Neg Hx     Social History   Socioeconomic History  . Marital status: Married    Spouse name: Not on file  . Number of children: 1  . Years of education: Not on file  . Highest education level: Not on file  Occupational History  . Occupation: Retired  Scientific laboratory technician  . Financial resource strain: Not on file  . Food insecurity:    Worry: Not on file    Inability: Not on file  . Transportation needs:    Medical: Not on file    Non-medical: Not on file  Tobacco Use  . Smoking status: Former Smoker    Packs/day: 1.00    Years: 34.00    Pack years: 34.00    Types: Cigarettes    Last attempt to quit: 12/26/1988    Years since quitting: 29.6  . Smokeless tobacco: Never Used  Substance and Sexual Activity  . Alcohol use: Yes    Alcohol/week: 6.0 standard drinks    Types: 6 Cans of beer per week     Comment: BEER  . Drug use: No  . Sexual activity: Never  Lifestyle  . Physical activity:    Days per week: Not on file    Minutes per session: Not on file  . Stress: Not on file  Relationships  . Social connections:    Talks on phone: Not on file    Gets together: Not on file    Attends religious service: Not on file    Active member of club or organization: Not on file    Attends meetings of clubs or organizations: Not on file    Relationship status: Not on file  . Intimate partner violence:    Fear of current  or ex partner: Not on file    Emotionally abused: Not on file    Physically abused: Not on file    Forced sexual activity: Not on file  Other Topics Concern  . Not on file  Social History Narrative  . Not on file    Past Medical History, Surgical history, Social history, and Family history were reviewed and updated as appropriate.   Please see review of systems for further details on the patient's review from today.   Review of Systems:  Review of Systems  Constitutional: Positive for chills and fever. Negative for diaphoresis.  HENT: Negative for trouble swallowing and voice change.   Respiratory: Negative for cough, chest tightness, shortness of breath and wheezing.   Cardiovascular: Negative for chest pain and palpitations.  Gastrointestinal: Negative for abdominal pain, constipation, diarrhea, nausea and vomiting.  Musculoskeletal: Negative for back pain and myalgias.  Neurological: Negative for dizziness, light-headedness and headaches.    Objective:   Physical Exam:  BP (!) 147/92 (BP Location: Left Arm, Patient Position: Sitting)   Pulse 87   Temp 98.4 F (36.9 C) (Oral)   Resp 18   Ht 5\' 11"  (1.803 m)   Wt 186 lb 8 oz (84.6 kg)   SpO2 99%   BMI 26.01 kg/m  ECOG: 0  Physical Exam Constitutional:      General: He is not in acute distress.    Appearance: He is not diaphoretic.  HENT:     Head: Normocephalic and atraumatic.  Cardiovascular:      Rate and Rhythm: Normal rate and regular rhythm.     Heart sounds: Normal heart sounds. No murmur. No friction rub. No gallop.   Pulmonary:     Effort: Pulmonary effort is normal. No respiratory distress.     Breath sounds: Normal breath sounds. No wheezing or rales.  Abdominal:     General: Bowel sounds are normal. There is no distension.     Tenderness: There is no abdominal tenderness. There is no guarding.     Comments: A urostomy was noted with hazy yellow urine with sediment. No erythema or exudate noted at the urostomy opening.  Skin:    General: Skin is warm and dry.     Findings: No erythema or rash.  Neurological:     General: No focal deficit present.     Mental Status: He is alert.     Lab Review:     Component Value Date/Time   NA 137 07/26/2018 1209   NA 141 05/23/2017 0828   K 3.9 07/26/2018 1209   K 4.0 05/23/2017 0828   CL 97 (L) 07/26/2018 1209   CO2 29 07/26/2018 1209   CO2 27 05/23/2017 0828   GLUCOSE 104 (H) 07/26/2018 1209   GLUCOSE 97 05/23/2017 0828   BUN 15 07/26/2018 1209   BUN 12.5 05/23/2017 0828   CREATININE 0.80 07/26/2018 1209   CREATININE 0.8 05/23/2017 0828   CALCIUM 9.3 07/26/2018 1209   CALCIUM 9.0 05/23/2017 0828   PROT 6.2 (L) 07/26/2018 1209   PROT 5.6 (L) 05/23/2017 0828   ALBUMIN 2.4 (L) 07/26/2018 1209   ALBUMIN 2.5 (L) 05/23/2017 0828   AST 43 (H) 07/26/2018 1209   AST 13 05/23/2017 0828   ALT 34 07/26/2018 1209   ALT 15 05/23/2017 0828   ALKPHOS 64 07/26/2018 1209   ALKPHOS 57 05/23/2017 0828   BILITOT 0.4 07/26/2018 1209   BILITOT 0.37 05/23/2017 0828   GFRNONAA >60 07/26/2018 1209  GFRAA >60 07/26/2018 1209       Component Value Date/Time   WBC 5.9 07/26/2018 1209   WBC 2.8 (L) 04/18/2018 0627   RBC 3.62 (L) 07/26/2018 1209   HGB 11.7 (L) 07/26/2018 1209   HGB 12.7 (L) 05/23/2017 0828   HCT 36.8 (L) 07/26/2018 1209   HCT 37.3 (L) 05/23/2017 0828   PLT 229 07/26/2018 1209   PLT 298 05/23/2017 0828   MCV  101.7 (H) 07/26/2018 1209   MCV 104.2 (H) 05/23/2017 0828   MCH 32.3 07/26/2018 1209   MCHC 31.8 07/26/2018 1209   RDW 13.2 07/26/2018 1209   RDW 13.6 05/23/2017 0828   LYMPHSABS 0.7 07/26/2018 1209   LYMPHSABS 0.8 (L) 05/23/2017 0828   MONOABS 1.0 07/26/2018 1209   MONOABS 1.0 (H) 05/23/2017 0828   EOSABS 0.1 07/26/2018 1209   EOSABS 0.1 05/23/2017 0828   BASOSABS 0.0 07/26/2018 1209   BASOSABS 0.0 05/23/2017 0828   -------------------------------  Imaging from last 24 hours (if applicable):  Radiology interpretation: Dg Chest 2 View  Result Date: 07/27/2018 CLINICAL DATA:  Bladder cancer.  Fever. EXAM: CHEST - 2 VIEW COMPARISON:  Chest CT 06/09/2018 FINDINGS: There is no edema, consolidation, or pneumothorax. Known pulmonary nodules are not well identified. Trace pleural effusions, also seen previously. Remote lateral right rib fractures. Normal heart size and mediastinal contours. IMPRESSION: 1. Negative for pneumonia. 2. Trace pleural effusions. Electronically Signed   By: Monte Fantasia M.D.   On: 07/27/2018 08:06        This case was discussed Dr. Alen Blew. He expressed agreement with my management of this patient.

## 2018-07-27 NOTE — Progress Notes (Signed)
These preliminary result these preliminary results were noted.  Awaiting final report.

## 2018-07-28 DIAGNOSIS — Z79899 Other long term (current) drug therapy: Secondary | ICD-10-CM | POA: Diagnosis not present

## 2018-07-28 DIAGNOSIS — Z5181 Encounter for therapeutic drug level monitoring: Secondary | ICD-10-CM | POA: Diagnosis not present

## 2018-07-28 LAB — URINE CULTURE: Culture: >=100000 — AB

## 2018-08-01 DIAGNOSIS — C799 Secondary malignant neoplasm of unspecified site: Secondary | ICD-10-CM | POA: Diagnosis not present

## 2018-08-02 ENCOUNTER — Encounter: Payer: Self-pay | Admitting: Nurse Practitioner

## 2018-08-02 ENCOUNTER — Other Ambulatory Visit: Payer: Self-pay | Admitting: Oncology

## 2018-08-02 ENCOUNTER — Inpatient Hospital Stay: Payer: Medicare Other

## 2018-08-02 ENCOUNTER — Inpatient Hospital Stay (HOSPITAL_BASED_OUTPATIENT_CLINIC_OR_DEPARTMENT_OTHER): Payer: Medicare Other | Admitting: Nurse Practitioner

## 2018-08-02 VITALS — BP 154/84 | HR 100 | Temp 97.9°F | Resp 18 | Ht 71.0 in | Wt 189.9 lb

## 2018-08-02 DIAGNOSIS — Z9221 Personal history of antineoplastic chemotherapy: Secondary | ICD-10-CM | POA: Diagnosis not present

## 2018-08-02 DIAGNOSIS — Z5112 Encounter for antineoplastic immunotherapy: Secondary | ICD-10-CM | POA: Diagnosis not present

## 2018-08-02 DIAGNOSIS — Z923 Personal history of irradiation: Secondary | ICD-10-CM | POA: Diagnosis not present

## 2018-08-02 DIAGNOSIS — C679 Malignant neoplasm of bladder, unspecified: Secondary | ICD-10-CM

## 2018-08-02 DIAGNOSIS — R509 Fever, unspecified: Secondary | ICD-10-CM

## 2018-08-02 DIAGNOSIS — C7800 Secondary malignant neoplasm of unspecified lung: Secondary | ICD-10-CM

## 2018-08-02 DIAGNOSIS — C7951 Secondary malignant neoplasm of bone: Secondary | ICD-10-CM

## 2018-08-02 DIAGNOSIS — Z79899 Other long term (current) drug therapy: Secondary | ICD-10-CM | POA: Diagnosis not present

## 2018-08-02 LAB — CBC WITH DIFFERENTIAL (CANCER CENTER ONLY)
Abs Immature Granulocytes: 0.14 10*3/uL — ABNORMAL HIGH (ref 0.00–0.07)
Basophils Absolute: 0.1 10*3/uL (ref 0.0–0.1)
Basophils Relative: 1 %
Eosinophils Absolute: 0.2 10*3/uL (ref 0.0–0.5)
Eosinophils Relative: 3 %
HCT: 39.1 % (ref 39.0–52.0)
Hemoglobin: 12.4 g/dL — ABNORMAL LOW (ref 13.0–17.0)
Immature Granulocytes: 2 %
Lymphocytes Relative: 14 %
Lymphs Abs: 0.9 10*3/uL (ref 0.7–4.0)
MCH: 32.4 pg (ref 26.0–34.0)
MCHC: 31.7 g/dL (ref 30.0–36.0)
MCV: 102.1 fL — ABNORMAL HIGH (ref 80.0–100.0)
Monocytes Absolute: 0.6 10*3/uL (ref 0.1–1.0)
Monocytes Relative: 9 %
Neutro Abs: 4.6 10*3/uL (ref 1.7–7.7)
Neutrophils Relative %: 71 %
Platelet Count: 333 10*3/uL (ref 150–400)
RBC: 3.83 MIL/uL — ABNORMAL LOW (ref 4.22–5.81)
RDW: 13.2 % (ref 11.5–15.5)
WBC Count: 6.4 10*3/uL (ref 4.0–10.5)
nRBC: 0 % (ref 0.0–0.2)

## 2018-08-02 LAB — CMP (CANCER CENTER ONLY)
ALT: 25 U/L (ref 0–44)
AST: 19 U/L (ref 15–41)
Albumin: 2.6 g/dL — ABNORMAL LOW (ref 3.5–5.0)
Alkaline Phosphatase: 67 U/L (ref 38–126)
Anion gap: 11 (ref 5–15)
BUN: 12 mg/dL (ref 8–23)
CO2: 24 mmol/L (ref 22–32)
Calcium: 9.4 mg/dL (ref 8.9–10.3)
Chloride: 104 mmol/L (ref 98–111)
Creatinine: 1.07 mg/dL (ref 0.61–1.24)
GFR, Est AFR Am: >60 mL/min (ref >60)
GFR, Estimated: >60 mL/min (ref >60)
Glucose, Bld: 137 mg/dL — ABNORMAL HIGH (ref 70–99)
Potassium: 4.2 mmol/L (ref 3.5–5.1)
Sodium: 139 mmol/L (ref 135–145)
Total Bilirubin: <0.2 mg/dL — ABNORMAL LOW (ref 0.3–1.2)
Total Protein: 5.8 g/dL — ABNORMAL LOW (ref 6.5–8.1)

## 2018-08-02 MED ORDER — FENTANYL 50 MCG/HR TD PT72: 50.0000 ug | MEDICATED_PATCH | TRANSDERMAL | 0 refills | Status: DC

## 2018-08-02 MED ORDER — OXYCODONE HCL 5 MG PO TABS: 5.0000 mg | ORAL_TABLET | ORAL | 0 refills | Status: DC | PRN

## 2018-08-02 MED ORDER — SODIUM CHLORIDE 0.9 % IV SOLN
200.0000 mg | Freq: Once | INTRAVENOUS | Status: AC
  Administered 2018-08-02: 200 mg via INTRAVENOUS
  Filled 2018-08-02: qty 8

## 2018-08-02 MED ORDER — DIPHENHYDRAMINE HCL 50 MG/ML IJ SOLN
INTRAMUSCULAR | Status: AC
  Filled 2018-08-02: qty 1

## 2018-08-02 MED ORDER — SODIUM CHLORIDE 0.9 % IV SOLN
Freq: Once | INTRAVENOUS | Status: AC
  Administered 2018-08-02: 10:00:00 via INTRAVENOUS
  Filled 2018-08-02: qty 250

## 2018-08-02 MED ORDER — DIPHENHYDRAMINE HCL 50 MG/ML IJ SOLN
25.0000 mg | Freq: Once | INTRAMUSCULAR | Status: AC | PRN
  Administered 2018-08-02: 25 mg via INTRAVENOUS

## 2018-08-02 NOTE — Progress Notes (Signed)
  [place] OFFICE PROGRESS NOTE   Diagnosis:  Bladder cancer, stage IV with lung and pelvic metastasis; tumor is PDL 1+; original tumor diagnosed in 2018 with localized disease  Prior Therapy:  He is status post neoadjuvant chemotherapy utilizing gemcitabine and cisplatin started in September 2018.  He received day 1 of cycle 1 on 05/02/2017 and therapy discontinued after that.  He is status post laparoscopic Cystoprostatectomy and bilateral lymphadenectomy done by Dr. Tresa Moore on 06/29/2017. Final pathology showed a T3bN0 disease.  He has 13 lymph nodes sampled without any evidence of malignancy.  He completed radiation therapy to the pelvis on January 16, 2018 under the care of Dr. Tammi Klippel.  Carboplatin and gemcitabine he is status post 6 cycles of therapy completed on 05/22/2018.   Current therapy: Pembrolizumab 200 mg every 3 weeks started on 06/21/2018.  INTERVAL HISTORY:   [name] returns as scheduled.  He completed cycle 2 pembrolizumab 07/12/2018.  He denies nausea/vomiting.  No mouth sores.  No diarrhea.  No rash.  No shortness of breath or cough.  He continues to have right hip pain.  No other pain.  He reports possible hip surgery the first week of January.  He continues a fentanyl patch with oxycodone and Dilaudid as needed.  Objective:  Vital signs in last 24 hours:  Blood pressure (!) 154/84, pulse 100, temperature 97.9 F (36.6 C), temperature source Oral, resp. rate 18, height 5\' 11"  (1.803 m), weight 189 lb 14.4 oz (86.1 kg), SpO2 97 %.    HEENT: No thrush or ulcers. Resp: Lungs clear bilaterally. Cardio: Regular rate and rhythm. GI: Abdomen soft and nontender.  No hepatomegaly.  Urostomy right abdomen. Vascular: No leg edema.  Skin: No rash.   Lab Results:  Lab Results  Component Value Date   WBC 6.4 08/02/2018   HGB 12.4 (L) 08/02/2018   HCT 39.1 08/02/2018   MCV 102.1 (H) 08/02/2018   PLT 333 08/02/2018   NEUTROABS 4.6 08/02/2018     Imaging:  No results found.  Medications: I have reviewed the patient's current medications.  Assessment/Plan: 1. Stage IV bladder cancer with documented metastatic disease to the lung and right hip currently on active treatment with pembrolizumab.  He has completed 2 cycles. 2. Hip pain.  Duragesic patch, Dilaudid and oxycodone as needed.  Disposition: Mr. Natzke appears stable.  He has completed 2 cycles of pembrolizumab.  Plan to proceed with cycle 3 today as scheduled.  The plan is for restaging CTs after he has completed 4 cycles.  He continues a Duragesic patch with oxycodone and Dilaudid for breakthrough pain.  New prescriptions for the Duragesic patch and oxycodone were sent to his pharmacy.  He will return for lab, follow-up and cycle 4 pembrolizumab in 3 weeks.  He will contact the office in the interim with any problems.  Plan reviewed with Dr. Alen Blew.    Ned Card ANP/GNP-BC   08/02/2018  9:31 AM

## 2018-08-02 NOTE — Patient Instructions (Signed)
[facility] Discharge Instructions for Patients Receiving Chemotherapy  Today you received the following chemotherapy agent: Pembrolizumab (Keytruda).  To help prevent nausea and vomiting after your treatment, we encourage you to take your nausea medication as prescribed.   If you develop nausea and vomiting that is not controlled by your nausea medication, call the clinic.   BELOW ARE SYMPTOMS THAT SHOULD BE REPORTED IMMEDIATELY:  *FEVER GREATER THAN 100.5 F  *CHILLS WITH OR WITHOUT FEVER  NAUSEA AND VOMITING THAT IS NOT CONTROLLED WITH YOUR NAUSEA MEDICATION  *UNUSUAL SHORTNESS OF BREATH  *UNUSUAL BRUISING OR BLEEDING  TENDERNESS IN MOUTH AND THROAT WITH OR WITHOUT PRESENCE OF ULCERS  *URINARY PROBLEMS  *BOWEL PROBLEMS  UNUSUAL RASH Items with * indicate a potential emergency and should be followed up as soon as possible.  Feel free to call the clinic should you have any questions or concerns. The clinic phone number is [phone].  Please show the CHEMO ALERT CARD at check-in to the Emergency Department and triage nurse.   

## 2018-08-14 DIAGNOSIS — C799 Secondary malignant neoplasm of unspecified site: Secondary | ICD-10-CM | POA: Diagnosis not present

## 2018-08-14 DIAGNOSIS — C7951 Secondary malignant neoplasm of bone: Secondary | ICD-10-CM | POA: Diagnosis not present

## 2018-08-23 ENCOUNTER — Inpatient Hospital Stay: Payer: Medicare Other | Attending: Oncology

## 2018-08-23 ENCOUNTER — Encounter: Payer: Medicare Other | Admitting: Nutrition

## 2018-08-23 ENCOUNTER — Inpatient Hospital Stay: Payer: Medicare Other

## 2018-08-23 ENCOUNTER — Other Ambulatory Visit: Payer: Medicare Other

## 2018-08-23 ENCOUNTER — Inpatient Hospital Stay (HOSPITAL_BASED_OUTPATIENT_CLINIC_OR_DEPARTMENT_OTHER): Payer: Medicare Other | Admitting: Oncology

## 2018-08-23 ENCOUNTER — Ambulatory Visit: Payer: Medicare Other | Admitting: Oncology

## 2018-08-23 ENCOUNTER — Other Ambulatory Visit: Payer: Self-pay

## 2018-08-23 VITALS — HR 85

## 2018-08-23 VITALS — BP 151/93 | HR 109 | Temp 98.3°F | Resp 18 | Wt 190.4 lb

## 2018-08-23 DIAGNOSIS — C7951 Secondary malignant neoplasm of bone: Secondary | ICD-10-CM | POA: Diagnosis not present

## 2018-08-23 DIAGNOSIS — C7800 Secondary malignant neoplasm of unspecified lung: Secondary | ICD-10-CM | POA: Diagnosis not present

## 2018-08-23 DIAGNOSIS — C679 Malignant neoplasm of bladder, unspecified: Secondary | ICD-10-CM

## 2018-08-23 DIAGNOSIS — G893 Neoplasm related pain (acute) (chronic): Secondary | ICD-10-CM | POA: Insufficient documentation

## 2018-08-23 DIAGNOSIS — Z5112 Encounter for antineoplastic immunotherapy: Secondary | ICD-10-CM | POA: Diagnosis not present

## 2018-08-23 DIAGNOSIS — Z923 Personal history of irradiation: Secondary | ICD-10-CM | POA: Insufficient documentation

## 2018-08-23 LAB — CBC WITH DIFFERENTIAL (CANCER CENTER ONLY)
Abs Immature Granulocytes: 0.03 10*3/uL (ref 0.00–0.07)
Basophils Absolute: 0.1 10*3/uL (ref 0.0–0.1)
Basophils Relative: 1 %
Eosinophils Absolute: 0.4 10*3/uL (ref 0.0–0.5)
Eosinophils Relative: 6 %
HCT: 39.7 % (ref 39.0–52.0)
Hemoglobin: 13 g/dL (ref 13.0–17.0)
Immature Granulocytes: 1 %
Lymphocytes Relative: 15 %
Lymphs Abs: 1 10*3/uL (ref 0.7–4.0)
MCH: 32.2 pg (ref 26.0–34.0)
MCHC: 32.7 g/dL (ref 30.0–36.0)
MCV: 98.3 fL (ref 80.0–100.0)
Monocytes Absolute: 0.8 10*3/uL (ref 0.1–1.0)
Monocytes Relative: 13 %
Neutro Abs: 4.3 10*3/uL (ref 1.7–7.7)
Neutrophils Relative %: 64 %
Platelet Count: 315 10*3/uL (ref 150–400)
RBC: 4.04 MIL/uL — ABNORMAL LOW (ref 4.22–5.81)
RDW: 13.9 % (ref 11.5–15.5)
WBC Count: 6.6 10*3/uL (ref 4.0–10.5)
nRBC: 0 % (ref 0.0–0.2)

## 2018-08-23 LAB — CMP (CANCER CENTER ONLY)
ALT: 20 U/L (ref 0–44)
AST: 18 U/L (ref 15–41)
Albumin: 2.8 g/dL — ABNORMAL LOW (ref 3.5–5.0)
Alkaline Phosphatase: 68 U/L (ref 38–126)
Anion gap: 11 (ref 5–15)
BUN: 15 mg/dL (ref 8–23)
CO2: 25 mmol/L (ref 22–32)
Calcium: 9.5 mg/dL (ref 8.9–10.3)
Chloride: 103 mmol/L (ref 98–111)
Creatinine: 0.79 mg/dL (ref 0.61–1.24)
GFR, Est AFR Am: >60 mL/min (ref >60)
GFR, Estimated: >60 mL/min (ref >60)
Glucose, Bld: 107 mg/dL — ABNORMAL HIGH (ref 70–99)
Potassium: 3.8 mmol/L (ref 3.5–5.1)
Sodium: 139 mmol/L (ref 135–145)
Total Bilirubin: 0.3 mg/dL (ref 0.3–1.2)
Total Protein: 6.3 g/dL — ABNORMAL LOW (ref 6.5–8.1)

## 2018-08-23 MED ORDER — SODIUM CHLORIDE 0.9 % IV SOLN
200.0000 mg | Freq: Once | INTRAVENOUS | Status: AC
  Administered 2018-08-23: 200 mg via INTRAVENOUS
  Filled 2018-08-23: qty 8

## 2018-08-23 MED ORDER — DIPHENHYDRAMINE HCL 50 MG/ML IJ SOLN
INTRAMUSCULAR | Status: AC
  Filled 2018-08-23: qty 1

## 2018-08-23 MED ORDER — SODIUM CHLORIDE 0.9 % IV SOLN
Freq: Once | INTRAVENOUS | Status: AC
  Administered 2018-08-23: 14:00:00 via INTRAVENOUS
  Filled 2018-08-23: qty 250

## 2018-08-23 MED ORDER — DIPHENHYDRAMINE HCL 50 MG/ML IJ SOLN
25.0000 mg | Freq: Once | INTRAMUSCULAR | Status: AC
  Administered 2018-08-23: 25 mg via INTRAVENOUS

## 2018-08-23 NOTE — Patient Instructions (Signed)
[facility] Discharge Instructions for Patients Receiving Chemotherapy  Today you received the following chemotherapy agent: Pembrolizumab (Keytruda).  To help prevent nausea and vomiting after your treatment, we encourage you to take your nausea medication as prescribed.   If you develop nausea and vomiting that is not controlled by your nausea medication, call the clinic.   BELOW ARE SYMPTOMS THAT SHOULD BE REPORTED IMMEDIATELY:  *FEVER GREATER THAN 100.5 F  *CHILLS WITH OR WITHOUT FEVER  NAUSEA AND VOMITING THAT IS NOT CONTROLLED WITH YOUR NAUSEA MEDICATION  *UNUSUAL SHORTNESS OF BREATH  *UNUSUAL BRUISING OR BLEEDING  TENDERNESS IN MOUTH AND THROAT WITH OR WITHOUT PRESENCE OF ULCERS  *URINARY PROBLEMS  *BOWEL PROBLEMS  UNUSUAL RASH Items with * indicate a potential emergency and should be followed up as soon as possible.  Feel free to call the clinic should you have any questions or concerns. The clinic phone number is [phone].  Please show the CHEMO ALERT CARD at check-in to the Emergency Department and triage nurse.   

## 2018-08-23 NOTE — Progress Notes (Signed)
Hematology and Oncology Follow Up Visit  [name] [id number] [birth year] 78 y.o. 08/23/2018 12:17 PM [name], MDFusco, Purcell Nails, MD   Principle Diagnosis: 78 year old man with bladder cancer diagnosed in 2018.  He developed stage IV disease with lung and pelvic metastasis confirmed and in 2019.  His tumor is PDL 1 positive  with CPS score over 90%. Prior Therapy:   He is status post neoadjuvant chemotherapy utilizing gemcitabine and cisplatin started in September 2018.  He received day 1 of cycle 1 on 05/02/2017 and therapy discontinued after that.  He is status post laparoscopic Cystoprostatectomy and bilateral lymphadenectomy done by Dr. Tresa Moore on 06/29/2017. Final pathology showed a T3bN0 disease.  He has 13 lymph nodes sampled without any evidence of malignancy.  He completed radiation therapy to the pelvis on January 16, 2018 under the care of Dr. Tammi Klippel.   Carboplatin and gemcitabine he is status post 6 cycles of therapy completed on 05/22/2018.   Current therapy: Pembrolizumab 200 mg every 3 weeks started on 06/21/2018.  He is here for cycle 4 of therapy.  Interim History: [name] presents today for a follow-up.  Since the last visit, he reports no major changes in his health.  He underwent embolization of his tumor on the right hip and has tolerated the procedure reasonably well.  He did notice increase in his chronic hip pain temporarily but currently improving.  Continues to use fentanyl patch and breakthrough with oxycodone and at times Dilaudid.  He still has some difficulty ambulating and putting weight on the right hip.  He has tolerated Pembrolizumab without any major complications.  He denies any dermatological toxicities, respiratory complaints or changes in his bowels.  He does not report any headaches, blurry vision, syncope or seizures.  He denies any confusion or dizziness.  He does not report any fevers, chills or sweats. He does not report any cough, wheezing or  shortness of breath. He does not report any chest pain, palpitation, orthopnea or leg edema.  He does not report any nausea, vomiting.  He denies any constipation or diarrhea.  He denied lymphadenopathy or petechiae.  He does not report any frequency urgency or hesitancy.  He denied any anxiety or depression. He denies any skin rashes or lesions.  He denies any bleeding or clotting tendency.  Remaining review of systems is negative.  Medications: I have reviewed the patient's current medications.  Current Outpatient Medications  Medication Sig Dispense Refill  . acetaminophen (TYLENOL) 500 MG tablet Take 1,000 mg by mouth every 6 (six) hours as needed for fever.     . bisacodyl (DULCOLAX) 5 MG EC tablet Take 5 mg by mouth daily as needed for moderate constipation.    . diphenhydrAMINE (BENADRYL) 25 MG tablet Take 25 mg by mouth every 6 (six) hours as needed for itching.    . feeding supplement, ENSURE ENLIVE, (ENSURE ENLIVE) LIQD Take 237 mLs by mouth 2 (two) times daily between meals. 237 mL 12  . fentaNYL (DURAGESIC - DOSED MCG/HR) 50 MCG/HR Place 1 patch (50 mcg total) onto the skin every 3 (three) days. 10 patch 0  . HYDROmorphone (DILAUDID) 4 MG tablet Take 1 tablet (4 mg total) by mouth every 4 (four) hours as needed for severe pain. 120 tablet 0  . hydrOXYzine (ATARAX/VISTARIL) 50 MG tablet Take 1 tablet (50 mg total) by mouth 4 (four) times daily as needed. (Patient not taking: Reported on 07/26/2018) 30 tablet 0  . lactose free nutrition (BOOST) LIQD Take  237 mLs by mouth 3 (three) times daily between meals.    Marland Kitchen oxyCODONE (OXY IR/ROXICODONE) 5 MG immediate release tablet Take 1-2 tablets (5-10 mg total) by mouth every 4 (four) hours as needed for severe pain. 120 tablet 0  . polyethylene glycol (MIRALAX / GLYCOLAX) packet Take 17 g by mouth daily. (Patient not taking: Reported on 08/02/2018) 14 each 0  . prochlorperazine (COMPAZINE) 10 MG tablet Take 1 tablet (10 mg total) by mouth every 6  (six) hours as needed for nausea or vomiting. (Patient not taking: Reported on 08/02/2018) 30 tablet 0  . sulfamethoxazole-trimethoprim (BACTRIM DS,SEPTRA DS) 800-160 MG tablet Take 1 tablet by mouth 2 (two) times daily. 14 tablet 0   No current facility-administered medications for this visit.      Allergies:  Allergies  Allergen Reactions  . Ciprofloxacin Hives  . Cefepime Rash    Past Medical History, Surgical history, Social history, and Family History were reviewed and updated.  Physical Exam:   Blood pressure (!) 151/93, pulse (!) 109, temperature 98.3 F (36.8 C), temperature source Oral, resp. rate 18, weight 190 lb 7 oz (86.4 kg), SpO2 96 %.     ECOG: 1     General appearance: Comfortable appearing without any discomfort Head: Normocephalic without any trauma Oropharynx: Mucous membranes are moist and pink without any thrush or ulcers. Eyes: Pupils are equal and round reactive to light. Lymph nodes: No cervical, supraclavicular, inguinal or axillary lymphadenopathy.   Heart:regular rate and rhythm.  S1 and S2 without leg edema. Lung: Clear without any rhonchi or wheezes.  No dullness to percussion. Abdomin: Soft, nontender, nondistended with good bowel sounds.  No hepatosplenomegaly. Musculoskeletal: No joint deformity or effusion.  Full range of motion noted. Neurological: No deficits noted on motor, sensory and deep tendon reflex exam. Skin: No petechial rash or dryness.  Appeared moist.          Lab Results: Lab Results  Component Value Date   WBC 6.4 08/02/2018   HGB 12.4 (L) 08/02/2018   HCT 39.1 08/02/2018   MCV 102.1 (H) 08/02/2018   PLT 333 08/02/2018     Chemistry      Component Value Date/Time   NA 139 08/02/2018 0840   NA 141 05/23/2017 0828   K 4.2 08/02/2018 0840   K 4.0 05/23/2017 0828   CL 104 08/02/2018 0840   CO2 24 08/02/2018 0840   CO2 27 05/23/2017 0828   BUN 12 08/02/2018 0840   BUN 12.5 05/23/2017 0828   CREATININE  1.07 08/02/2018 0840   CREATININE 0.8 05/23/2017 0828      Component Value Date/Time   CALCIUM 9.4 08/02/2018 0840   CALCIUM 9.0 05/23/2017 0828   ALKPHOS 67 08/02/2018 0840   ALKPHOS 57 05/23/2017 0828   AST 19 08/02/2018 0840   AST 13 05/23/2017 0828   ALT 25 08/02/2018 0840   ALT 15 05/23/2017 0828   BILITOT <0.2 (L) 08/02/2018 0840   BILITOT 0.37 05/23/2017 0828       Impression and Plan:  78 year old man with:   1.  Bladder cancer with disease disease to the lung and hip indicating stage IV noted in 2019.  He continues to receive Pembrolizumab without any major complications.  Risks and benefits associated with this therapy on long-term complications were discussed.  The plan is to continue with therapy and repeat imaging studies before cycle 5.  He is agreeable to proceed at this time.  2. Hip pain: He is status  post pelvic embolization of his tumor completed on 08/14/2018 at [facility].  He continues to be on fentanyl and breakthrough oxycodone for moderate pain.  He will use Dilaudid only for severe pain.  3.  Goals of care: He has incurable disease but disease that can be palliated further.  His performance status is adequate and aggressive therapy is warranted.  4.  IV access: Peripheral veins remain adequate at this time.  Port-A-Cath will be deferred unless any issues reported.  He is interested in having Port-A-Cath for long-term treatment is needed.  Depending on the CT scan will make determination for IV access in the future.  5.  Antiemetics: No nausea or vomiting reported at this time.  Antiemetics available to him.  6.  Immune mediated side effects: I continue to educate him about long-term immune mediated complications.  These include colitis, pneumonitis, thyroid disease among others.  7.  Follow-up: Will be in 3 weeks for the next cycle of therapy.  25  minutes was spent with the patient face-to-face today.  More than 50% of time was  dedicated to discussing his disease status, treatment options, complications related therapy and answering questions regarding future plan of care.  Zola Button, MD 1/8/202012:17 PM

## 2018-08-29 DIAGNOSIS — C799 Secondary malignant neoplasm of unspecified site: Secondary | ICD-10-CM | POA: Diagnosis not present

## 2018-08-30 ENCOUNTER — Telehealth: Payer: Self-pay | Admitting: *Deleted

## 2018-08-30 NOTE — Telephone Encounter (Signed)
Returned call to patient, per dr Alen Blew no need for MRI of neck and patient states he is running a fever of 101. Denies any other symptoms. Instructed her to give tylenol now.

## 2018-08-30 NOTE — Telephone Encounter (Signed)
Spoke with patient's wife. States patient no longer running a temp. Now 98.1. instructed her on s/s of infection and if fever with shaking chills, he is to report to the E.R. wife verbalized understanding.

## 2018-08-31 ENCOUNTER — Other Ambulatory Visit: Payer: Self-pay | Admitting: Oncology

## 2018-08-31 ENCOUNTER — Telehealth: Payer: Self-pay | Admitting: *Deleted

## 2018-08-31 MED ORDER — SULFAMETHOXAZOLE-TRIMETHOPRIM 800-160 MG PO TABS: 1.0000 | ORAL_TABLET | Freq: Two times a day (BID) | ORAL | 0 refills | Status: DC

## 2018-08-31 NOTE — Telephone Encounter (Signed)
Please let him know Rx for Bactrim was called to his pharmacy.

## 2018-08-31 NOTE — Telephone Encounter (Signed)
Spoke with patient and wife. Dr Alen Blew called in a prescription for bactrim to patient's pharmacy.

## 2018-08-31 NOTE — Telephone Encounter (Signed)
Patient and wife calling. States he is still running fevers in the 100's. Takes tylenol and it goes down, only to return again.shaking chills this morning, diarrhea x 2. They are requesting an antibiotic

## 2018-09-11 ENCOUNTER — Ambulatory Visit (HOSPITAL_COMMUNITY)
Admission: RE | Admit: 2018-09-11 | Discharge: 2018-09-11 | Disposition: A | Discharge status: Home / Self Care | Payer: Medicare Other | Source: Ambulatory Visit | Attending: Oncology | Admitting: Oncology

## 2018-09-11 ENCOUNTER — Inpatient Hospital Stay: Payer: Medicare Other

## 2018-09-11 DIAGNOSIS — C679 Malignant neoplasm of bladder, unspecified: Secondary | ICD-10-CM

## 2018-09-11 DIAGNOSIS — C7989 Secondary malignant neoplasm of other specified sites: Secondary | ICD-10-CM | POA: Diagnosis not present

## 2018-09-11 DIAGNOSIS — Z5112 Encounter for antineoplastic immunotherapy: Secondary | ICD-10-CM | POA: Diagnosis not present

## 2018-09-11 DIAGNOSIS — C7800 Secondary malignant neoplasm of unspecified lung: Secondary | ICD-10-CM | POA: Diagnosis not present

## 2018-09-11 DIAGNOSIS — Z923 Personal history of irradiation: Secondary | ICD-10-CM | POA: Diagnosis not present

## 2018-09-11 DIAGNOSIS — C7951 Secondary malignant neoplasm of bone: Secondary | ICD-10-CM | POA: Diagnosis not present

## 2018-09-11 DIAGNOSIS — G893 Neoplasm related pain (acute) (chronic): Secondary | ICD-10-CM | POA: Diagnosis not present

## 2018-09-11 LAB — CBC WITH DIFFERENTIAL (CANCER CENTER ONLY)
Abs Immature Granulocytes: 0.08 10*3/uL — ABNORMAL HIGH (ref 0.00–0.07)
Basophils Absolute: 0 10*3/uL (ref 0.0–0.1)
Basophils Relative: 1 %
Eosinophils Absolute: 0.3 10*3/uL (ref 0.0–0.5)
Eosinophils Relative: 4 %
HCT: 40.6 % (ref 39.0–52.0)
Hemoglobin: 13 g/dL (ref 13.0–17.0)
Immature Granulocytes: 1 %
Lymphocytes Relative: 13 %
Lymphs Abs: 1 10*3/uL (ref 0.7–4.0)
MCH: 31.5 pg (ref 26.0–34.0)
MCHC: 32 g/dL (ref 30.0–36.0)
MCV: 98.3 fL (ref 80.0–100.0)
Monocytes Absolute: 0.7 10*3/uL (ref 0.1–1.0)
Monocytes Relative: 10 %
Neutro Abs: 5.4 10*3/uL (ref 1.7–7.7)
Neutrophils Relative %: 71 %
Platelet Count: 279 10*3/uL (ref 150–400)
RBC: 4.13 MIL/uL — ABNORMAL LOW (ref 4.22–5.81)
RDW: 14.3 % (ref 11.5–15.5)
WBC Count: 7.5 10*3/uL (ref 4.0–10.5)
nRBC: 0 % (ref 0.0–0.2)

## 2018-09-11 LAB — CMP (CANCER CENTER ONLY)
ALT: 24 U/L (ref 0–44)
AST: 18 U/L (ref 15–41)
Albumin: 2.9 g/dL — ABNORMAL LOW (ref 3.5–5.0)
Alkaline Phosphatase: 72 U/L (ref 38–126)
Anion gap: 10 (ref 5–15)
BUN: 11 mg/dL (ref 8–23)
CO2: 26 mmol/L (ref 22–32)
Calcium: 9.8 mg/dL (ref 8.9–10.3)
Chloride: 105 mmol/L (ref 98–111)
Creatinine: 0.78 mg/dL (ref 0.61–1.24)
GFR, Est AFR Am: >60 mL/min (ref >60)
GFR, Estimated: >60 mL/min (ref >60)
Glucose, Bld: 100 mg/dL — ABNORMAL HIGH (ref 70–99)
Potassium: 4.2 mmol/L (ref 3.5–5.1)
Sodium: 141 mmol/L (ref 135–145)
Total Bilirubin: 0.3 mg/dL (ref 0.3–1.2)
Total Protein: 6.5 g/dL (ref 6.5–8.1)

## 2018-09-11 MED ORDER — IOHEXOL 300 MG/ML  SOLN
100.0000 mL | Freq: Once | INTRAMUSCULAR | Status: AC | PRN
  Administered 2018-09-11: 100 mL via INTRAVENOUS

## 2018-09-11 MED ORDER — SODIUM CHLORIDE (PF) 0.9 % IJ SOLN
INTRAMUSCULAR | Status: AC
  Filled 2018-09-11: qty 50

## 2018-09-11 MED ORDER — SODIUM CHLORIDE 0.9 % IV SOLN
INTRAVENOUS | Status: AC
  Filled 2018-09-11: qty 250

## 2018-09-11 MED ORDER — SODIUM CHLORIDE (PF) 0.9 % IJ SOLN
INTRAMUSCULAR | Status: AC
  Filled 2018-09-11: qty 100

## 2018-09-13 ENCOUNTER — Inpatient Hospital Stay (HOSPITAL_BASED_OUTPATIENT_CLINIC_OR_DEPARTMENT_OTHER): Payer: Medicare Other | Admitting: Oncology

## 2018-09-13 ENCOUNTER — Ambulatory Visit: Payer: Medicare Other

## 2018-09-13 ENCOUNTER — Telehealth: Payer: Self-pay | Admitting: Oncology

## 2018-09-13 DIAGNOSIS — Z923 Personal history of irradiation: Secondary | ICD-10-CM

## 2018-09-13 DIAGNOSIS — C679 Malignant neoplasm of bladder, unspecified: Secondary | ICD-10-CM

## 2018-09-13 DIAGNOSIS — Z5112 Encounter for antineoplastic immunotherapy: Secondary | ICD-10-CM | POA: Diagnosis not present

## 2018-09-13 DIAGNOSIS — G893 Neoplasm related pain (acute) (chronic): Secondary | ICD-10-CM | POA: Diagnosis not present

## 2018-09-13 DIAGNOSIS — C7951 Secondary malignant neoplasm of bone: Secondary | ICD-10-CM

## 2018-09-13 DIAGNOSIS — C7800 Secondary malignant neoplasm of unspecified lung: Secondary | ICD-10-CM | POA: Diagnosis not present

## 2018-09-13 MED ORDER — HYDROMORPHONE HCL 4 MG PO TABS: 4.0000 mg | ORAL_TABLET | ORAL | 0 refills | Status: DC | PRN

## 2018-09-13 MED ORDER — CAMPHOR-MENTHOL 0.5-0.5 % EX LOTN: 1.0000 "application " | TOPICAL_LOTION | CUTANEOUS | 3 refills | Status: DC | PRN

## 2018-09-13 MED ORDER — DIPHENHYDRAMINE HCL 50 MG/ML IJ SOLN
INTRAMUSCULAR | Status: AC
  Filled 2018-09-13: qty 1

## 2018-09-13 MED ORDER — SODIUM CHLORIDE 0.9 % IV SOLN
200.0000 mg | Freq: Once | INTRAVENOUS | Status: AC
  Administered 2018-09-13: 200 mg via INTRAVENOUS
  Filled 2018-09-13: qty 8

## 2018-09-13 MED ORDER — CAMPHOR-MENTHOL 0.5-0.5 % EX LOTN: 1.0000 "application " | TOPICAL_LOTION | CUTANEOUS | 0 refills | Status: DC | PRN

## 2018-09-13 MED ORDER — OXYCODONE HCL 5 MG PO TABS: 5.0000 mg | ORAL_TABLET | ORAL | 0 refills | Status: DC | PRN

## 2018-09-13 MED ORDER — SODIUM CHLORIDE 0.9 % IV SOLN
Freq: Once | INTRAVENOUS | Status: AC
  Administered 2018-09-13: 09:00:00 via INTRAVENOUS
  Filled 2018-09-13: qty 250

## 2018-09-13 MED ORDER — DIPHENHYDRAMINE HCL 50 MG/ML IJ SOLN
25.0000 mg | Freq: Once | INTRAMUSCULAR | Status: AC
  Administered 2018-09-13: 25 mg via INTRAVENOUS

## 2018-09-13 MED ORDER — FENTANYL 50 MCG/HR TD PT72: 1.0000 | MEDICATED_PATCH | TRANSDERMAL | 0 refills | Status: DC

## 2018-09-13 NOTE — Telephone Encounter (Signed)
Scheduled appt per 01/29 los. ° °Printed calendar and avs. °

## 2018-09-13 NOTE — Progress Notes (Signed)
Per Dr. Alen Blew, add diphenhydramine 25 mg IV to this treatment and all subsequent pembrolizumab treatments. Henreitta Leber, RPh notified via phone.

## 2018-09-13 NOTE — Progress Notes (Signed)
Hematology and Oncology Follow Up Visit  [name] [id number] February 09, 1941 78 y.o. 09/13/2018 8:37 AM [name], MDFusco, Purcell Nails, MD   Principle Diagnosis: 78 year old man with advanced bladder cancer with lung and pelvic metastasis diagnosed in 2019.  His tumor is PDL 1 positive  with CPS score over 90%.  He presented with early-stage disease in 2018.   Prior Therapy:   He is status post neoadjuvant chemotherapy utilizing gemcitabine and cisplatin started in September 2018.  He received day 1 of cycle 1 on 05/02/2017 and therapy discontinued after that.  He is status post laparoscopic Cystoprostatectomy and bilateral lymphadenectomy done by Dr. Tresa Moore on 06/29/2017. Final pathology showed a T3bN0 disease.  He has 13 lymph nodes sampled without any evidence of malignancy.  He completed radiation therapy to the pelvis on January 16, 2018 under the care of Dr. Tammi Klippel.   Carboplatin and gemcitabine he is status post 6 cycles of therapy completed on 05/22/2018.   Current therapy: Pembrolizumab 200 mg every 3 weeks started on 06/21/2018.  He is here for cycle 5 of therapy.  Interim History: [name] returns today for repeat evaluation.  Since the last visit, he continues to tolerate Pembrolizumab without any major complications.  He does report some pruritus and mild skin rash but no other complaints.  He is ambulatory with the help of a cane although he still has some issues with right hip discomfort.  He denies any nausea or infusion related complications.  His appetite has been reasonable and weight is unchanged.  He denies any pulmonary issues or recent hospitalizations.  He was diagnosed with a urinary tract infection with hematuria and dysuria which has resolved at this time.  He does not report any headaches, blurry vision, syncope or seizures.  He denies any alteration mental status or lethargy.  He does not report any fevers, chills or sweats. He does not report any cough, wheezing or  dyspnea on exertion. He does not report any chest pain, palpitation, orthopnea or leg edema.  He does not report any nausea, vomiting.  He denies any changes in bowel habits. He does not report any frequency urgency or hesitancy.  He denied any mood changes. He denies any hair or nail changes.  Denies any petechiae or bruising.  Remaining review of systems is negative.  Medications: I have reviewed the patient's current medications.  Current Outpatient Medications  Medication Sig Dispense Refill  . acetaminophen (TYLENOL) 500 MG tablet Take 1,000 mg by mouth every 6 (six) hours as needed for fever.     . bisacodyl (DULCOLAX) 5 MG EC tablet Take 5 mg by mouth daily as needed for moderate constipation.    . diphenhydrAMINE (BENADRYL) 25 MG tablet Take 25 mg by mouth every 6 (six) hours as needed for itching.    . feeding supplement, ENSURE ENLIVE, (ENSURE ENLIVE) LIQD Take 237 mLs by mouth 2 (two) times daily between meals. 237 mL 12  . fentaNYL (DURAGESIC - DOSED MCG/HR) 50 MCG/HR Place 1 patch (50 mcg total) onto the skin every 3 (three) days. 10 patch 0  . HYDROmorphone (DILAUDID) 4 MG tablet Take 1 tablet (4 mg total) by mouth every 4 (four) hours as needed for severe pain. 120 tablet 0  . hydrOXYzine (ATARAX/VISTARIL) 50 MG tablet Take 1 tablet (50 mg total) by mouth 4 (four) times daily as needed. (Patient not taking: Reported on 07/26/2018) 30 tablet 0  . lactose free nutrition (BOOST) LIQD Take 237 mLs by mouth 3 (three)  times daily between meals.    Marland Kitchen oxyCODONE (OXY IR/ROXICODONE) 5 MG immediate release tablet Take 1-2 tablets (5-10 mg total) by mouth every 4 (four) hours as needed for severe pain. 120 tablet 0  . polyethylene glycol (MIRALAX / GLYCOLAX) packet Take 17 g by mouth daily. (Patient not taking: Reported on 08/02/2018) 14 each 0  . prochlorperazine (COMPAZINE) 10 MG tablet Take 1 tablet (10 mg total) by mouth every 6 (six) hours as needed for nausea or vomiting. (Patient not taking:  Reported on 08/02/2018) 30 tablet 0  . sulfamethoxazole-trimethoprim (BACTRIM DS,SEPTRA DS) 800-160 MG tablet Take 1 tablet by mouth 2 (two) times daily. 14 tablet 0   No current facility-administered medications for this visit.      Allergies:  Allergies  Allergen Reactions  . Ciprofloxacin Hives  . Cefepime Rash    Past Medical History, Surgical history, Social history, and Family History were reviewed and updated.  Physical Exam:    Blood pressure (!) 157/87, pulse 84, temperature 98 F (36.7 C), temperature source Oral, resp. rate 18, height 5\' 11"  (1.803 m), weight 191 lb 12.8 oz (87 kg), SpO2 97 %.     ECOG: 1     General appearance: Alert, awake without any distress. Head: Atraumatic without abnormalities Oropharynx: Without any thrush or ulcers. Eyes: No scleral icterus. Lymph nodes: No lymphadenopathy noted in the cervical, supraclavicular, or axillary nodes Heart:regular rate and rhythm, without any murmurs or gallops.   Lung: Clear to auscultation without any rhonchi, wheezes or dullness to percussion. Abdomin: Soft, nontender without any shifting dullness or ascites. Musculoskeletal: No clubbing or cyanosis. Neurological: No motor or sensory deficits. Skin: Mild erythema noted on his left flank           Lab Results: Lab Results  Component Value Date   WBC 7.5 09/11/2018   HGB 13.0 09/11/2018   HCT 40.6 09/11/2018   MCV 98.3 09/11/2018   PLT 279 09/11/2018     Chemistry      Component Value Date/Time   NA 141 09/11/2018 1052   NA 141 05/23/2017 0828   K 4.2 09/11/2018 1052   K 4.0 05/23/2017 0828   CL 105 09/11/2018 1052   CO2 26 09/11/2018 1052   CO2 27 05/23/2017 0828   BUN 11 09/11/2018 1052   BUN 12.5 05/23/2017 0828   CREATININE 0.78 09/11/2018 1052   CREATININE 0.8 05/23/2017 0828      Component Value Date/Time   CALCIUM 9.8 09/11/2018 1052   CALCIUM 9.0 05/23/2017 0828   ALKPHOS 72 09/11/2018 1052   ALKPHOS 57  05/23/2017 0828   AST 18 09/11/2018 1052   AST 13 05/23/2017 0828   ALT 24 09/11/2018 1052   ALT 15 05/23/2017 0828   BILITOT 0.3 09/11/2018 1052   BILITOT 0.37 05/23/2017 0828     EXAM: CT CHEST, ABDOMEN, AND PELVIS WITH CONTRAST  TECHNIQUE: Multidetector CT imaging of the chest, abdomen and pelvis was performed following the standard protocol during bolus administration of intravenous contrast.  CONTRAST:  125mL OMNIPAQUE IOHEXOL 300 MG/ML  SOLN  COMPARISON:  MRI right hip dated 06/19/2018. CT chest abdomen pelvis dated 06/09/2018.  FINDINGS: CT CHEST FINDINGS  Cardiovascular: The heart is normal in size. No pericardial effusion.  No evidence of thoracic aortic aneurysm. Mild atherosclerotic calcifications of the aortic arch.  Coronary atherosclerosis the LAD and left circumflex.  Mediastinum/Nodes: Small mediastinal lymph nodes which do not meet pathologic CT size criteria.  Visualized thyroid is unremarkable.  Lungs/Pleura: Improving bilateral pulmonary nodules/metastases, including:  --13 x 9 mm spiculated nodule in the anterior left upper lobe (series 7/image 40), previously 15 x 13 mm  --4 mm right middle lobe nodule (series 7/image 25), previously 8 x 7 mm  --8 x 4 mm left lower lobe nodule (series 7/image 119), previously 10 x 8 mm  Mild biapical pleural-parenchymal scarring.  No focal consolidation.  Trace left pleural effusion with minimal dependent atelectasis. No pneumothorax.  Musculoskeletal: Mild degenerative changes of the lower thoracic spine.  CT ABDOMEN PELVIS FINDINGS  Hepatobiliary: Scattered hepatic cysts, measuring up to 2.7 cm in segment 4A (series 3/image 56).  Gallbladder is unremarkable. No intrahepatic or extrahepatic ductal dilatation.  Pancreas: Within normal limits.  Spleen: Within normal limits.  Adrenals/Urinary Tract: Adrenal glands are within normal limits.  10 mm cyst in the anterior  right upper kidney (series 3/image 65). Left kidney is within normal limits. No hydronephrosis.  Status post cystectomy with right mid abdominal ileal conduit.  Stomach/Bowel: Stomach is within normal limits.  No evidence of bowel obstruction.  Appendix is not discretely visualized.  Vascular/Lymphatic: No evidence of abdominal aortic aneurysm.  Atherosclerotic calcifications of the abdominal aorta and branch vessels.  No suspicious abdominopelvic lymphadenopathy.  Reproductive: Status post prostatectomy.  Other: No abdominopelvic ascites.  Musculoskeletal: Metastasis along the right inferior pubic ramus with pathologic fracture and associated soft tissue component, measuring 5.9 x 4.2 cm in maximal axial dimension (series 3/image 132), unchanged.  Mild degenerative changes the lower lumbar spine.  IMPRESSION: Status post cystoprostatectomy with right mid abdominal ileal conduit.  Bilateral pulmonary nodules/metastases, mildly improved. Trace left pleural effusion.  Metastasis along the right inferior pubic ramus with pathologic fracture, grossly unchanged.  Additional stable ancillary findings as above.   Impression and Plan:  78 year old man with:   1.  Stage IV bladder cancer diagnosed in 2019.  He initially presented with early-stage disease in 2018 and subsequently developed pelvic bone metastasis and pulmonary lesions.  He has tolerated Pembrolizumab without any major complications.  CT scan on September 11, 2018 was personally reviewed and showed positive response to therapy.  He has bilateral pulmonary nodules improved with stable right metastasis in the right hip.  Risks and benefits associated with this therapy was discussed today.  I have recommended continuing treatment at this time given his positive response.  After discussion he is agreeable to proceed for the time being.  Duration of therapy will be determined depending on his  tolerance.  2. Hip pain: Manageable at this time.  He continues to use hydrocodone for mild pain, Dilaudid for severe pain.  He has not been regularly using fentanyl patch.  I reiterated today the importance of using long-acting pain medication to decrease his breakthrough which would be more efficient controlling his pain.  He understands at this time and willing to use fentanyl 50 mcg every 3 days continuously.  3.  Goals of care: Therapy remains palliative at this time but his performance status is excellent and aggressive therapy is warranted.  4.  IV access: Peripheral veins remain in use at this time.  5.  Antiemetics: No issues reported at this time with nausea or vomiting.  6.  Immune mediated side effects: Long-term complications such as colitis, pneumonitis among others were reviewed he is not experiencing any other than mild dermatological toxicities.  Topical creams are recommended as well as as needed Benadryl.  7.  Follow-up: Will be in 3 weeks for the next cycle of  therapy.  25  minutes was spent with the patient face-to-face today.  More than 50% of time was dedicated to reviewing his disease status, imaging studies and dealing with complications related to therapy as well as pain management.  Zola Button, MD 1/29/20208:37 AM

## 2018-09-26 ENCOUNTER — Other Ambulatory Visit: Payer: Self-pay | Admitting: Oncology

## 2018-09-26 ENCOUNTER — Telehealth: Payer: Self-pay | Admitting: *Deleted

## 2018-09-26 MED ORDER — SULFAMETHOXAZOLE-TRIMETHOPRIM 800-160 MG PO TABS: 1.0000 | ORAL_TABLET | Freq: Two times a day (BID) | ORAL | 0 refills | Status: DC

## 2018-09-26 NOTE — Telephone Encounter (Signed)
Rx of bactrim will be called for him.

## 2018-09-26 NOTE — Telephone Encounter (Signed)
Daughter in law lisa bolin calling. States patient has been running a fever for 2 days and only taking tylenol, they are requesting an antibiotic. I spoke with patient, he states his fever gets as high as 100.9 and he takes tylenol, it come back down. Patient reminded he has a chemo card and it has parameters on when to call the office or report to the emergency room. Patient requests a refill on his antibiotic.

## 2018-09-26 NOTE — Telephone Encounter (Signed)
Spoke with daughter Lattie Haw, antibiotic to be called in for patient.

## 2018-10-04 ENCOUNTER — Telehealth: Payer: Self-pay | Admitting: Oncology

## 2018-10-04 ENCOUNTER — Inpatient Hospital Stay (HOSPITAL_BASED_OUTPATIENT_CLINIC_OR_DEPARTMENT_OTHER): Payer: Medicare Other | Admitting: Oncology

## 2018-10-04 ENCOUNTER — Inpatient Hospital Stay: Payer: Medicare Other

## 2018-10-04 ENCOUNTER — Inpatient Hospital Stay: Payer: Medicare Other | Attending: Oncology

## 2018-10-04 VITALS — HR 85

## 2018-10-04 DIAGNOSIS — C7951 Secondary malignant neoplasm of bone: Secondary | ICD-10-CM | POA: Diagnosis not present

## 2018-10-04 DIAGNOSIS — G893 Neoplasm related pain (acute) (chronic): Secondary | ICD-10-CM | POA: Diagnosis not present

## 2018-10-04 DIAGNOSIS — C679 Malignant neoplasm of bladder, unspecified: Secondary | ICD-10-CM

## 2018-10-04 DIAGNOSIS — Z923 Personal history of irradiation: Secondary | ICD-10-CM | POA: Insufficient documentation

## 2018-10-04 DIAGNOSIS — Z5112 Encounter for antineoplastic immunotherapy: Secondary | ICD-10-CM | POA: Diagnosis not present

## 2018-10-04 DIAGNOSIS — C7800 Secondary malignant neoplasm of unspecified lung: Secondary | ICD-10-CM | POA: Diagnosis not present

## 2018-10-04 DIAGNOSIS — Z79899 Other long term (current) drug therapy: Secondary | ICD-10-CM | POA: Insufficient documentation

## 2018-10-04 LAB — CBC WITH DIFFERENTIAL (CANCER CENTER ONLY)
Abs Immature Granulocytes: 0.04 10*3/uL (ref 0.00–0.07)
Basophils Absolute: 0.1 10*3/uL (ref 0.0–0.1)
Basophils Relative: 1 %
Eosinophils Absolute: 0.2 10*3/uL (ref 0.0–0.5)
Eosinophils Relative: 4 %
HCT: 45.7 % (ref 39.0–52.0)
Hemoglobin: 14.3 g/dL (ref 13.0–17.0)
Immature Granulocytes: 1 %
Lymphocytes Relative: 14 %
Lymphs Abs: 0.9 10*3/uL (ref 0.7–4.0)
MCH: 31.4 pg (ref 26.0–34.0)
MCHC: 31.3 g/dL (ref 30.0–36.0)
MCV: 100.2 fL — ABNORMAL HIGH (ref 80.0–100.0)
Monocytes Absolute: 0.5 10*3/uL (ref 0.1–1.0)
Monocytes Relative: 8 %
Neutro Abs: 4.6 10*3/uL (ref 1.7–7.7)
Neutrophils Relative %: 72 %
Platelet Count: 311 10*3/uL (ref 150–400)
RBC: 4.56 MIL/uL (ref 4.22–5.81)
RDW: 15.6 % — ABNORMAL HIGH (ref 11.5–15.5)
WBC Count: 6.3 10*3/uL (ref 4.0–10.5)
nRBC: 0 % (ref 0.0–0.2)

## 2018-10-04 LAB — CMP (CANCER CENTER ONLY)
ALT: 24 U/L (ref 0–44)
AST: 19 U/L (ref 15–41)
Albumin: 3.2 g/dL — ABNORMAL LOW (ref 3.5–5.0)
Alkaline Phosphatase: 83 U/L (ref 38–126)
Anion gap: 10 (ref 5–15)
BUN: 11 mg/dL (ref 8–23)
CO2: 25 mmol/L (ref 22–32)
Calcium: 9.8 mg/dL (ref 8.9–10.3)
Chloride: 105 mmol/L (ref 98–111)
Creatinine: 1.03 mg/dL (ref 0.61–1.24)
GFR, Est AFR Am: >60 mL/min (ref >60)
GFR, Estimated: >60 mL/min (ref >60)
Glucose, Bld: 152 mg/dL — ABNORMAL HIGH (ref 70–99)
Potassium: 4 mmol/L (ref 3.5–5.1)
Sodium: 140 mmol/L (ref 135–145)
Total Bilirubin: 0.4 mg/dL (ref 0.3–1.2)
Total Protein: 6.8 g/dL (ref 6.5–8.1)

## 2018-10-04 LAB — TSH: TSH: 2.984 u[IU]/mL (ref 0.320–4.118)

## 2018-10-04 MED ORDER — HYDROMORPHONE HCL 4 MG PO TABS: 4.0000 mg | ORAL_TABLET | ORAL | 0 refills | Status: DC | PRN

## 2018-10-04 MED ORDER — DIPHENHYDRAMINE HCL 50 MG/ML IJ SOLN
25.0000 mg | Freq: Once | INTRAMUSCULAR | Status: AC
  Administered 2018-10-04: 25 mg via INTRAVENOUS

## 2018-10-04 MED ORDER — FENTANYL 50 MCG/HR TD PT72: 1.0000 | MEDICATED_PATCH | TRANSDERMAL | 0 refills | Status: DC

## 2018-10-04 MED ORDER — OXYCODONE HCL 5 MG PO TABS: 5.0000 mg | ORAL_TABLET | ORAL | 0 refills | Status: DC | PRN

## 2018-10-04 MED ORDER — DIPHENHYDRAMINE HCL 50 MG/ML IJ SOLN
INTRAMUSCULAR | Status: AC
  Filled 2018-10-04: qty 1

## 2018-10-04 MED ORDER — SODIUM CHLORIDE 0.9 % IV SOLN
200.0000 mg | Freq: Once | INTRAVENOUS | Status: AC
  Administered 2018-10-04: 200 mg via INTRAVENOUS
  Filled 2018-10-04: qty 8

## 2018-10-04 MED ORDER — SODIUM CHLORIDE 0.9 % IV SOLN
Freq: Once | INTRAVENOUS | Status: AC
  Administered 2018-10-04: 11:00:00 via INTRAVENOUS
  Filled 2018-10-04: qty 250

## 2018-10-04 NOTE — Patient Instructions (Signed)
[facility] Discharge Instructions for Patients Receiving Chemotherapy  Today you received the following chemotherapy agent: Pembrolizumab (Keytruda).  To help prevent nausea and vomiting after your treatment, we encourage you to take your nausea medication as prescribed.   If you develop nausea and vomiting that is not controlled by your nausea medication, call the clinic.   BELOW ARE SYMPTOMS THAT SHOULD BE REPORTED IMMEDIATELY:  *FEVER GREATER THAN 100.5 F  *CHILLS WITH OR WITHOUT FEVER  NAUSEA AND VOMITING THAT IS NOT CONTROLLED WITH YOUR NAUSEA MEDICATION  *UNUSUAL SHORTNESS OF BREATH  *UNUSUAL BRUISING OR BLEEDING  TENDERNESS IN MOUTH AND THROAT WITH OR WITHOUT PRESENCE OF ULCERS  *URINARY PROBLEMS  *BOWEL PROBLEMS  UNUSUAL RASH Items with * indicate a potential emergency and should be followed up as soon as possible.  Feel free to call the clinic should you have any questions or concerns. The clinic phone number is [phone].  Please show the CHEMO ALERT CARD at check-in to the Emergency Department and triage nurse.   

## 2018-10-04 NOTE — Telephone Encounter (Signed)
Scheduled appt per 2/19 los.  Cancelled March appt per MD request.  Printed calendar and avs.

## 2018-10-04 NOTE — Progress Notes (Signed)
Hematology and Oncology Follow Up Visit  [name] [id number] 07-09-1941 78 y.o. 10/04/2018 9:50 AM [name], MDFusco, Purcell Nails, MD   Principle Diagnosis: 78 year old man with stage IV advanced bladder cancer diagnosed in 2019.  He has disease in the bone as well as the lung with tumor that is PDL 1 positive  with CPS score over 90%.     Prior Therapy:   He is status post neoadjuvant chemotherapy utilizing gemcitabine and cisplatin started in September 2018.  He received day 1 of cycle 1 on 05/02/2017 and therapy discontinued after that.  He is status post laparoscopic Cystoprostatectomy and bilateral lymphadenectomy done by Dr. Tresa Moore on 06/29/2017. Final pathology showed a T3bN0 disease.  He has 13 lymph nodes sampled without any evidence of malignancy.  He completed radiation therapy to the pelvis on January 16, 2018 under the care of Dr. Tammi Klippel.   Carboplatin and gemcitabine he is status post 6 cycles of therapy completed on 05/22/2018.   Current therapy: Pembrolizumab 200 mg every 3 weeks started on 06/21/2018.  He is here for cycle 6 of therapy.  Interim History: [name] returns today for a repeat evaluation.  Since the last visit, continues to show slow improvement in his overall health.  He reports more mobility and less pain in his right hip although he still requires fentanyl patch and for breakthrough pain medication requiring oxycodone and occasionally Dilaudid.  His performance status and quality of life remains reasonable.  He has tolerated Pembrolizumab without any recent hospitalization or illnesses.  He denies any excessive fatigue or tiredness.  He continues to have issues with pruritus.  No respiratory or GI complaints.   Patient denied any alteration mental status, neuropathy, confusion or dizziness.  Denies any headaches or lethargy.  Denies any night sweats, weight loss or changes in appetite.  Denied orthopnea, dyspnea on exertion or chest discomfort.  Denies  shortness of breath, difficulty breathing hemoptysis or cough.  Denies any abdominal distention, nausea, early satiety or dyspepsia.  Denies any hematuria, frequency, dysuria or nocturia.  Denies any skin irritation, dryness or rash.  Denies any ecchymosis or petechiae.  Denies any lymphadenopathy or clotting.  Denies any heat or cold intolerance.  Denies any anxiety or depression.  Remaining review of system is negative.   Medications: I have reviewed the patient's current medications.  Current Outpatient Medications  Medication Sig Dispense Refill  . acetaminophen (TYLENOL) 500 MG tablet Take 1,000 mg by mouth every 6 (six) hours as needed for fever.     . bisacodyl (DULCOLAX) 5 MG EC tablet Take 5 mg by mouth daily as needed for moderate constipation.    . camphor-menthol (SARNA) lotion Apply 1 application topically as needed for itching. 222 mL 0  . camphor-menthol (SARNA) lotion Apply 1 application topically as needed for itching. 222 mL 3  . diphenhydrAMINE (BENADRYL) 25 MG tablet Take 25 mg by mouth every 6 (six) hours as needed for itching.    . feeding supplement, ENSURE ENLIVE, (ENSURE ENLIVE) LIQD Take 237 mLs by mouth 2 (two) times daily between meals. 237 mL 12  . fentaNYL (DURAGESIC) 50 MCG/HR Place 1 patch onto the skin every 3 (three) days. 10 patch 0  . HYDROmorphone (DILAUDID) 4 MG tablet Take 1 tablet (4 mg total) by mouth every 4 (four) hours as needed for severe pain. 120 tablet 0  . hydrOXYzine (ATARAX/VISTARIL) 50 MG tablet Take 1 tablet (50 mg total) by mouth 4 (four) times daily as needed. (Patient  not taking: Reported on 07/26/2018) 30 tablet 0  . lactose free nutrition (BOOST) LIQD Take 237 mLs by mouth 3 (three) times daily between meals.    Marland Kitchen oxyCODONE (OXY IR/ROXICODONE) 5 MG immediate release tablet Take 1-2 tablets (5-10 mg total) by mouth every 4 (four) hours as needed for severe pain. 120 tablet 0  . polyethylene glycol (MIRALAX / GLYCOLAX) packet Take 17 g by mouth  daily. (Patient not taking: Reported on 08/02/2018) 14 each 0  . prochlorperazine (COMPAZINE) 10 MG tablet Take 1 tablet (10 mg total) by mouth every 6 (six) hours as needed for nausea or vomiting. (Patient not taking: Reported on 08/02/2018) 30 tablet 0  . sulfamethoxazole-trimethoprim (BACTRIM DS,SEPTRA DS) 800-160 MG tablet Take 1 tablet by mouth 2 (two) times daily. 14 tablet 0   No current facility-administered medications for this visit.      Allergies:  Allergies  Allergen Reactions  . Ciprofloxacin Hives  . Cefepime Rash    Past Medical History, Surgical history, Social history, and Family History were reviewed and updated.  Physical Exam:    Blood pressure (!) 158/87, pulse (!) 114, temperature (!) 97.5 F (36.4 C), temperature source Oral, resp. rate 18, height 5\' 11"  (1.803 m), weight 192 lb 12.8 oz (87.5 kg), SpO2 97 %.     ECOG: 1    General appearance: Comfortable appearing without any discomfort Head: Normocephalic without any trauma Oropharynx: Mucous membranes are moist and pink without any thrush or ulcers. Eyes: Pupils are equal and round reactive to light. Lymph nodes: No cervical, supraclavicular, inguinal or axillary lymphadenopathy.   Heart:regular rate and rhythm.  S1 and S2 without leg edema. Lung: Clear without any rhonchi or wheezes.  No dullness to percussion. Abdomin: Soft, nontender, nondistended with good bowel sounds.  No hepatosplenomegaly. Musculoskeletal: No joint deformity or effusion.  Full range of motion noted. Neurological: No deficits noted on motor, sensory and deep tendon reflex exam. Skin: No petechial rash or dryness.  Appeared moist.             Lab Results: Lab Results  Component Value Date   WBC 6.3 10/04/2018   HGB 14.3 10/04/2018   HCT 45.7 10/04/2018   MCV 100.2 (H) 10/04/2018   PLT 311 10/04/2018     Chemistry      Component Value Date/Time   NA 140 10/04/2018 0857   NA 141 05/23/2017 0828   K 4.0  10/04/2018 0857   K 4.0 05/23/2017 0828   CL 105 10/04/2018 0857   CO2 25 10/04/2018 0857   CO2 27 05/23/2017 0828   BUN 11 10/04/2018 0857   BUN 12.5 05/23/2017 0828   CREATININE 1.03 10/04/2018 0857   CREATININE 0.8 05/23/2017 0828      Component Value Date/Time   CALCIUM 9.8 10/04/2018 0857   CALCIUM 9.0 05/23/2017 0828   ALKPHOS 83 10/04/2018 0857   ALKPHOS 57 05/23/2017 0828   AST 19 10/04/2018 0857   AST 13 05/23/2017 0828   ALT 24 10/04/2018 0857   ALT 15 05/23/2017 0828   BILITOT 0.4 10/04/2018 0857   BILITOT 0.37 05/23/2017 0828       Impression and Plan:  78 year old man with:   1.  Bladder cancer diagnosed in 2018 and subsequently developed stage IV disease in 2019.  He continues to be on Pembrolizumab with reasonable response to therapy based on CT scan noted in January 2020.  Risks and benefits of continuing this therapy and long-term complications were reiterated.  At this time, he is agreeable to proceed with treatment today and will proceed with a treatment break based on his wishes till April 8.  We tentatively repeat imaging studies in May 2019.  2. Hip pain: Pain is manageable at this time with fentanyl as well as oxycodone which was refilled for him.  3.  Goals of care: Therapy remains palliative at this time although aggressive therapy is warranted given his performance status.  4.  IV access: No issues with IV access at this time although his peripheral veins remain in use.  5.  Antiemetics: Antiemetics available to him.  No issues with nausea and vomiting.  6.  Immune mediated side effects: I continue to reiterate these long-term complications including pneumonitis, colitis among others.  He is experiencing issues with pruritus and a treatment break will help with that.  7.  Follow-up: Will be in 7 weeks to follow his progress.  25  minutes was spent with the patient face-to-face today.  More than 50% of time was dedicated to discussing his  disease status, treatment options and answering questions regarding future plan of care.  Zola Button, MD 2/19/20209:50 AM

## 2018-10-25 ENCOUNTER — Ambulatory Visit: Payer: Medicare Other | Admitting: Oncology

## 2018-10-25 ENCOUNTER — Other Ambulatory Visit: Payer: Medicare Other

## 2018-10-25 ENCOUNTER — Ambulatory Visit: Payer: Medicare Other

## 2018-10-30 DIAGNOSIS — L308 Other specified dermatitis: Secondary | ICD-10-CM | POA: Diagnosis not present

## 2018-10-30 DIAGNOSIS — L503 Dermatographic urticaria: Secondary | ICD-10-CM | POA: Diagnosis not present

## 2018-11-10 DIAGNOSIS — C679 Malignant neoplasm of bladder, unspecified: Secondary | ICD-10-CM | POA: Diagnosis not present

## 2018-11-10 DIAGNOSIS — J449 Chronic obstructive pulmonary disease, unspecified: Secondary | ICD-10-CM | POA: Diagnosis not present

## 2018-11-10 DIAGNOSIS — M10071 Idiopathic gout, right ankle and foot: Secondary | ICD-10-CM | POA: Diagnosis not present

## 2018-11-10 DIAGNOSIS — I10 Essential (primary) hypertension: Secondary | ICD-10-CM | POA: Diagnosis not present

## 2018-11-10 DIAGNOSIS — Z681 Body mass index (BMI) 19 or less, adult: Secondary | ICD-10-CM | POA: Diagnosis not present

## 2018-11-22 ENCOUNTER — Inpatient Hospital Stay: Payer: Medicare Other

## 2018-11-22 ENCOUNTER — Other Ambulatory Visit: Payer: Self-pay

## 2018-11-22 ENCOUNTER — Telehealth: Payer: Self-pay | Admitting: Oncology

## 2018-11-22 ENCOUNTER — Inpatient Hospital Stay (HOSPITAL_BASED_OUTPATIENT_CLINIC_OR_DEPARTMENT_OTHER): Payer: Medicare Other | Admitting: Oncology

## 2018-11-22 ENCOUNTER — Inpatient Hospital Stay: Payer: Medicare Other | Attending: Oncology

## 2018-11-22 DIAGNOSIS — Z79899 Other long term (current) drug therapy: Secondary | ICD-10-CM

## 2018-11-22 DIAGNOSIS — Z923 Personal history of irradiation: Secondary | ICD-10-CM | POA: Diagnosis not present

## 2018-11-22 DIAGNOSIS — C679 Malignant neoplasm of bladder, unspecified: Secondary | ICD-10-CM | POA: Diagnosis not present

## 2018-11-22 DIAGNOSIS — Z9221 Personal history of antineoplastic chemotherapy: Secondary | ICD-10-CM

## 2018-11-22 DIAGNOSIS — C7800 Secondary malignant neoplasm of unspecified lung: Secondary | ICD-10-CM | POA: Diagnosis not present

## 2018-11-22 DIAGNOSIS — Z5112 Encounter for antineoplastic immunotherapy: Secondary | ICD-10-CM | POA: Diagnosis not present

## 2018-11-22 DIAGNOSIS — C7951 Secondary malignant neoplasm of bone: Secondary | ICD-10-CM | POA: Diagnosis not present

## 2018-11-22 LAB — CBC WITH DIFFERENTIAL (CANCER CENTER ONLY)
Abs Immature Granulocytes: 0.32 10*3/uL — ABNORMAL HIGH (ref 0.00–0.07)
Basophils Absolute: 0.1 10*3/uL (ref 0.0–0.1)
Basophils Relative: 1 %
Eosinophils Absolute: 0.3 10*3/uL (ref 0.0–0.5)
Eosinophils Relative: 3 %
HCT: 44.9 % (ref 39.0–52.0)
Hemoglobin: 14.1 g/dL (ref 13.0–17.0)
Immature Granulocytes: 3 %
Lymphocytes Relative: 11 %
Lymphs Abs: 1.3 10*3/uL (ref 0.7–4.0)
MCH: 32.5 pg (ref 26.0–34.0)
MCHC: 31.4 g/dL (ref 30.0–36.0)
MCV: 103.5 fL — ABNORMAL HIGH (ref 80.0–100.0)
Monocytes Absolute: 0.8 10*3/uL (ref 0.1–1.0)
Monocytes Relative: 6 %
Neutro Abs: 9.4 10*3/uL — ABNORMAL HIGH (ref 1.7–7.7)
Neutrophils Relative %: 76 %
Platelet Count: 323 10*3/uL (ref 150–400)
RBC: 4.34 MIL/uL (ref 4.22–5.81)
RDW: 13.9 % (ref 11.5–15.5)
WBC Count: 12.2 10*3/uL — ABNORMAL HIGH (ref 4.0–10.5)
nRBC: 0 % (ref 0.0–0.2)

## 2018-11-22 LAB — CMP (CANCER CENTER ONLY)
ALT: 50 U/L — ABNORMAL HIGH (ref 0–44)
AST: 26 U/L (ref 15–41)
Albumin: 3 g/dL — ABNORMAL LOW (ref 3.5–5.0)
Alkaline Phosphatase: 68 U/L (ref 38–126)
Anion gap: 11 (ref 5–15)
BUN: 21 mg/dL (ref 8–23)
CO2: 26 mmol/L (ref 22–32)
Calcium: 9.4 mg/dL (ref 8.9–10.3)
Chloride: 104 mmol/L (ref 98–111)
Creatinine: 0.91 mg/dL (ref 0.61–1.24)
GFR, Est AFR Am: >60 mL/min (ref >60)
GFR, Estimated: >60 mL/min (ref >60)
Glucose, Bld: 121 mg/dL — ABNORMAL HIGH (ref 70–99)
Potassium: 4.2 mmol/L (ref 3.5–5.1)
Sodium: 141 mmol/L (ref 135–145)
Total Bilirubin: 0.3 mg/dL (ref 0.3–1.2)
Total Protein: 6.2 g/dL — ABNORMAL LOW (ref 6.5–8.1)

## 2018-11-22 MED ORDER — DIPHENHYDRAMINE HCL 50 MG/ML IJ SOLN
25.0000 mg | Freq: Once | INTRAMUSCULAR | Status: AC
  Administered 2018-11-22: 25 mg via INTRAVENOUS

## 2018-11-22 MED ORDER — HYDROMORPHONE HCL 4 MG PO TABS: 4.0000 mg | ORAL_TABLET | ORAL | 0 refills | Status: DC | PRN

## 2018-11-22 MED ORDER — FENTANYL 50 MCG/HR TD PT72: 1.0000 | MEDICATED_PATCH | TRANSDERMAL | 0 refills | Status: DC

## 2018-11-22 MED ORDER — OXYCODONE HCL 5 MG PO TABS: 5.0000 mg | ORAL_TABLET | ORAL | 0 refills | Status: DC | PRN

## 2018-11-22 MED ORDER — SODIUM CHLORIDE 0.9 % IV SOLN
200.0000 mg | Freq: Once | INTRAVENOUS | Status: AC
  Administered 2018-11-22: 200 mg via INTRAVENOUS
  Filled 2018-11-22: qty 8

## 2018-11-22 MED ORDER — DIPHENHYDRAMINE HCL 50 MG/ML IJ SOLN
INTRAMUSCULAR | Status: AC
  Filled 2018-11-22: qty 1

## 2018-11-22 MED ORDER — SODIUM CHLORIDE 0.9 % IV SOLN
Freq: Once | INTRAVENOUS | Status: AC
  Administered 2018-11-22: 09:00:00 via INTRAVENOUS
  Filled 2018-11-22: qty 250

## 2018-11-22 NOTE — Patient Instructions (Signed)
[facility] Discharge Instructions for Patients Receiving Chemotherapy  Today you received the following chemotherapy agent: Pembrolizumab Beryle Flock).  To help prevent nausea and vomiting after your treatment, we encourage you to take your nausea medication as prescribed.   If you develop nausea and vomiting that is not controlled by your nausea medication, call the clinic.   BELOW ARE SYMPTOMS THAT SHOULD BE REPORTED IMMEDIATELY:  *FEVER GREATER THAN 100.5 F  *CHILLS WITH OR WITHOUT FEVER  NAUSEA AND VOMITING THAT IS NOT CONTROLLED WITH YOUR NAUSEA MEDICATION  *UNUSUAL SHORTNESS OF BREATH  *UNUSUAL BRUISING OR BLEEDING  TENDERNESS IN MOUTH AND THROAT WITH OR WITHOUT PRESENCE OF ULCERS  *URINARY PROBLEMS  *BOWEL PROBLEMS  UNUSUAL RASH Items with * indicate a potential emergency and should be followed up as soon as possible.  Feel free to call the clinic should you have any questions or concerns. The clinic phone number is [phone].  Please show the [facility] at check-in to the Emergency Department and triage nurse.

## 2018-11-22 NOTE — Telephone Encounter (Signed)
Scheduled appt per 4/8 los. °

## 2018-11-22 NOTE — Progress Notes (Signed)
Hematology and Oncology Follow Up Visit  [name] [id number] 1941-06-26 78 y.o. 11/22/2018 8:20 AM [name], MDFusco, Purcell Nails, MD   Principle Diagnosis: 78 year old man with bladder cancer diagnosed in 2019.  He was found to have stage IV disease and the bone as well as the lung with tumor that is PDL 1 positive  with CPS score over 90%.  It was additionally found to have localized tumor and 2018.   Prior Therapy:   He is status post neoadjuvant chemotherapy utilizing gemcitabine and cisplatin started in September 2018.  He received day 1 of cycle 1 on 05/02/2017 and therapy discontinued after that.  He is status post laparoscopic Cystoprostatectomy and bilateral lymphadenectomy done by Dr. Tresa Moore on 06/29/2017. Final pathology showed a T3bN0 disease.  He has 13 lymph nodes sampled without any evidence of malignancy.  He completed radiation therapy to the pelvis on January 16, 2018 under the care of Dr. Tammi Klippel.   Carboplatin and gemcitabine he is status post 6 cycles of therapy completed on 05/22/2018.   Current therapy: Pembrolizumab 200 mg every 3 weeks started on 06/21/2018.  He is status post 6 cycles of therapy.  Interim History: [name] is here for a repeat evaluation.  Since the last visit, he reports no major changes or complaints.  He had a treatment break with improvement in his overall performance status and quality of life.  His itching has subsided.  He also developed an episode of gout which has resolved at this time after treatment.  He continues to have issues with his hip pain which is manageable with the current pain medication.  Uses fentanyl patch and uses oxycodone for breakthrough.  He does use Dilaudid for severe pain although rarely.  His appetite and mobility remains reasonable.    He denied headaches, blurry vision, syncope or seizures.  Denies any fevers, chills or sweats.  Denied chest pain, palpitation, orthopnea or leg edema.  Denied cough, wheezing or  hemoptysis.  Denied nausea, vomiting or abdominal pain.  Denies any constipation or diarrhea.  Denies any frequency urgency or hesitancy.  Denies any arthralgias or myalgias.  Denies any skin rashes or lesions.  Denies any bleeding or clotting tendency.  Denies any easy bruising.  Denies any hair or nail changes.  Denies any anxiety or depression.  Remaining review of system is negative.     Medications: I have reviewed the patient's current medications.  Current Outpatient Medications  Medication Sig Dispense Refill  . acetaminophen (TYLENOL) 500 MG tablet Take 1,000 mg by mouth every 6 (six) hours as needed for fever.     . bisacodyl (DULCOLAX) 5 MG EC tablet Take 5 mg by mouth daily as needed for moderate constipation.    . camphor-menthol (SARNA) lotion Apply 1 application topically as needed for itching. 222 mL 0  . camphor-menthol (SARNA) lotion Apply 1 application topically as needed for itching. 222 mL 3  . diphenhydrAMINE (BENADRYL) 25 MG tablet Take 25 mg by mouth every 6 (six) hours as needed for itching.    . feeding supplement, ENSURE ENLIVE, (ENSURE ENLIVE) LIQD Take 237 mLs by mouth 2 (two) times daily between meals. 237 mL 12  . fentaNYL (DURAGESIC) 50 MCG/HR Place 1 patch onto the skin every 3 (three) days. 10 patch 0  . HYDROmorphone (DILAUDID) 4 MG tablet Take 1 tablet (4 mg total) by mouth every 4 (four) hours as needed for severe pain. 120 tablet 0  . hydrOXYzine (ATARAX/VISTARIL) 50 MG tablet Take  1 tablet (50 mg total) by mouth 4 (four) times daily as needed. (Patient not taking: Reported on 07/26/2018) 30 tablet 0  . lactose free nutrition (BOOST) LIQD Take 237 mLs by mouth 3 (three) times daily between meals.    Marland Kitchen oxyCODONE (OXY IR/ROXICODONE) 5 MG immediate release tablet Take 1-2 tablets (5-10 mg total) by mouth every 4 (four) hours as needed for severe pain. 120 tablet 0  . polyethylene glycol (MIRALAX / GLYCOLAX) packet Take 17 g by mouth daily. (Patient not taking:  Reported on 08/02/2018) 14 each 0  . prochlorperazine (COMPAZINE) 10 MG tablet Take 1 tablet (10 mg total) by mouth every 6 (six) hours as needed for nausea or vomiting. (Patient not taking: Reported on 08/02/2018) 30 tablet 0  . sulfamethoxazole-trimethoprim (BACTRIM DS,SEPTRA DS) 800-160 MG tablet Take 1 tablet by mouth 2 (two) times daily. 14 tablet 0   No current facility-administered medications for this visit.      Allergies:  Allergies  Allergen Reactions  . Ciprofloxacin Hives  . Cefepime Rash    Past Medical History, Surgical history, Social history, and Family History were reviewed and updated.  Physical Exam:    Blood pressure 140/89, pulse (!) 102, temperature 98.1 F (36.7 C), temperature source Oral, resp. rate 18, height 5\' 11"  (1.803 m), weight 194 lb 9.6 oz (88.3 kg), SpO2 96 %.      ECOG: 1     General appearance: Alert, awake without any distress. Head: Atraumatic without abnormalities Oropharynx: Without any thrush or ulcers. Eyes: No scleral icterus. Lymph nodes: No lymphadenopathy noted in the cervical, supraclavicular, or axillary nodes Heart:regular rate and rhythm, without any murmurs or gallops.   Lung: Clear to auscultation without any rhonchi, wheezes or dullness to percussion. Abdomin: Soft, nontender without any shifting dullness or ascites. Musculoskeletal: No clubbing or cyanosis. Neurological: No motor or sensory deficits. Skin: No rashes or lesions. Psychiatric: Mood and affect appeared normal.             Lab Results: Lab Results  Component Value Date   WBC 6.3 10/04/2018   HGB 14.3 10/04/2018   HCT 45.7 10/04/2018   MCV 100.2 (H) 10/04/2018   PLT 311 10/04/2018     Chemistry      Component Value Date/Time   NA 140 10/04/2018 0857   NA 141 05/23/2017 0828   K 4.0 10/04/2018 0857   K 4.0 05/23/2017 0828   CL 105 10/04/2018 0857   CO2 25 10/04/2018 0857   CO2 27 05/23/2017 0828   BUN 11 10/04/2018 0857   BUN  12.5 05/23/2017 0828   CREATININE 1.03 10/04/2018 0857   CREATININE 0.8 05/23/2017 0828      Component Value Date/Time   CALCIUM 9.8 10/04/2018 0857   CALCIUM 9.0 05/23/2017 0828   ALKPHOS 83 10/04/2018 0857   ALKPHOS 57 05/23/2017 0828   AST 19 10/04/2018 0857   AST 13 05/23/2017 0828   ALT 24 10/04/2018 0857   ALT 15 05/23/2017 0828   BILITOT 0.4 10/04/2018 0857   BILITOT 0.37 05/23/2017 0828       Impression and Plan:  78 year old man with:   1.  Stage IV bladder cancer with disease to the bone and lymphadenopathy noted in 2019.   He has completed 6 cycles of therapy with the last CT scan in January 2020 showed reasonable response to therapy.  Risks and benefits of resuming Pembrolizumab after short treatment break was discussed today.  He is agreeable to continue at  this time.  The plan is to proceed with cycle 7 today, cycle 8 in 3 weeks and repeat imaging studies in May 2020.  2. Hip pain: Pain is manageable with the current regimen.  I will refill his fentanyl, oxycodone and Dilaudid today.  3.  Goals of care: His disease is incurable and therapy remains palliative at this time.  4.  IV access: Peripheral veins have been used without any issues.  5.  Antiemetics: Compazine is available to him as well as other antiemetics.  No issues reported with nausea or vomiting.  6.  Immune mediated side effects: Long-term immune therapy complications were reiterated.  These were include thyroid disease, pneumonitis, colitis among others.  7.  Follow-up: Will be in 3 weeks for the next cycle of chemotherapy.  25  minutes was spent with the patient face-to-face today.  More than 50% of time was dedicated to reviewing the natural course of his disease, treatment options, complications related therapy answering questions regarding future plan of care.  Zola Button, MD 4/8/20208:20 AM

## 2018-11-24 ENCOUNTER — Telehealth: Payer: Self-pay | Admitting: Oncology

## 2018-11-24 NOTE — Telephone Encounter (Signed)
Left message re 4/29 appointments. Schedule mailed.

## 2018-12-13 ENCOUNTER — Telehealth: Payer: Self-pay

## 2018-12-13 ENCOUNTER — Inpatient Hospital Stay: Payer: Medicare Other

## 2018-12-13 ENCOUNTER — Inpatient Hospital Stay (HOSPITAL_BASED_OUTPATIENT_CLINIC_OR_DEPARTMENT_OTHER): Payer: Medicare Other | Admitting: Oncology

## 2018-12-13 ENCOUNTER — Other Ambulatory Visit: Payer: Self-pay

## 2018-12-13 VITALS — BP 144/94 | HR 114 | Temp 97.8°F | Resp 18 | Ht 71.0 in | Wt 193.3 lb

## 2018-12-13 VITALS — HR 100 | Resp 18

## 2018-12-13 DIAGNOSIS — Z923 Personal history of irradiation: Secondary | ICD-10-CM | POA: Diagnosis not present

## 2018-12-13 DIAGNOSIS — C679 Malignant neoplasm of bladder, unspecified: Secondary | ICD-10-CM

## 2018-12-13 DIAGNOSIS — C7951 Secondary malignant neoplasm of bone: Secondary | ICD-10-CM | POA: Diagnosis not present

## 2018-12-13 DIAGNOSIS — C7800 Secondary malignant neoplasm of unspecified lung: Secondary | ICD-10-CM

## 2018-12-13 DIAGNOSIS — Z9221 Personal history of antineoplastic chemotherapy: Secondary | ICD-10-CM

## 2018-12-13 DIAGNOSIS — Z79899 Other long term (current) drug therapy: Secondary | ICD-10-CM

## 2018-12-13 DIAGNOSIS — Z5112 Encounter for antineoplastic immunotherapy: Secondary | ICD-10-CM | POA: Diagnosis not present

## 2018-12-13 LAB — CMP (CANCER CENTER ONLY)
ALT: 23 U/L (ref 0–44)
AST: 11 U/L — ABNORMAL LOW (ref 15–41)
Albumin: 3.2 g/dL — ABNORMAL LOW (ref 3.5–5.0)
Alkaline Phosphatase: 87 U/L (ref 38–126)
Anion gap: 10 (ref 5–15)
BUN: 17 mg/dL (ref 8–23)
CO2: 23 mmol/L (ref 22–32)
Calcium: 9.7 mg/dL (ref 8.9–10.3)
Chloride: 107 mmol/L (ref 98–111)
Creatinine: 0.93 mg/dL (ref 0.61–1.24)
GFR, Est AFR Am: >60 mL/min (ref >60)
GFR, Estimated: >60 mL/min (ref >60)
Glucose, Bld: 157 mg/dL — ABNORMAL HIGH (ref 70–99)
Potassium: 4.2 mmol/L (ref 3.5–5.1)
Sodium: 140 mmol/L (ref 135–145)
Total Bilirubin: 0.4 mg/dL (ref 0.3–1.2)
Total Protein: 6.6 g/dL (ref 6.5–8.1)

## 2018-12-13 LAB — CBC WITH DIFFERENTIAL (CANCER CENTER ONLY)
Abs Immature Granulocytes: 0.04 10*3/uL (ref 0.00–0.07)
Basophils Absolute: 0 10*3/uL (ref 0.0–0.1)
Basophils Relative: 0 %
Eosinophils Absolute: 0 10*3/uL (ref 0.0–0.5)
Eosinophils Relative: 0 %
HCT: 45.5 % (ref 39.0–52.0)
Hemoglobin: 14.9 g/dL (ref 13.0–17.0)
Immature Granulocytes: 1 %
Lymphocytes Relative: 6 %
Lymphs Abs: 0.5 10*3/uL — ABNORMAL LOW (ref 0.7–4.0)
MCH: 33.3 pg (ref 26.0–34.0)
MCHC: 32.7 g/dL (ref 30.0–36.0)
MCV: 101.8 fL — ABNORMAL HIGH (ref 80.0–100.0)
Monocytes Absolute: 0.3 10*3/uL (ref 0.1–1.0)
Monocytes Relative: 4 %
Neutro Abs: 7.3 10*3/uL (ref 1.7–7.7)
Neutrophils Relative %: 89 %
Platelet Count: 238 10*3/uL (ref 150–400)
RBC: 4.47 MIL/uL (ref 4.22–5.81)
RDW: 14.5 % (ref 11.5–15.5)
WBC Count: 8.1 10*3/uL (ref 4.0–10.5)
nRBC: 0 % (ref 0.0–0.2)

## 2018-12-13 LAB — TSH: TSH: 0.839 u[IU]/mL (ref 0.320–4.118)

## 2018-12-13 MED ORDER — METHYLPREDNISOLONE 4 MG PO TABS: ORAL_TABLET | ORAL | 0 refills | Status: DC

## 2018-12-13 MED ORDER — SODIUM CHLORIDE 0.9 % IV SOLN
Freq: Once | INTRAVENOUS | Status: AC
  Administered 2018-12-13: 10:00:00 via INTRAVENOUS
  Filled 2018-12-13: qty 250

## 2018-12-13 MED ORDER — DIPHENHYDRAMINE HCL 50 MG/ML IJ SOLN
25.0000 mg | Freq: Once | INTRAMUSCULAR | Status: AC
  Administered 2018-12-13: 25 mg via INTRAVENOUS

## 2018-12-13 MED ORDER — DIPHENHYDRAMINE HCL 50 MG/ML IJ SOLN
INTRAMUSCULAR | Status: AC
  Filled 2018-12-13: qty 1

## 2018-12-13 MED ORDER — SODIUM CHLORIDE 0.9 % IV SOLN
200.0000 mg | Freq: Once | INTRAVENOUS | Status: AC
  Administered 2018-12-13: 200 mg via INTRAVENOUS
  Filled 2018-12-13: qty 8

## 2018-12-13 NOTE — Progress Notes (Signed)
Hematology and Oncology Follow Up Visit  MAICOL BOWLAND [id number] Aug 13, 1941 78 y.o. 12/13/2018 9:03 AM [name], MDFusco, Purcell Nails, MD   Principle Diagnosis: 78 year old man with stage IV bladder cancer with disease to the bone and the lung diagnosed in 2019.  His tumor is PDL 1 positive  with CPS score over 90%.     Prior Therapy:   He is status post neoadjuvant chemotherapy utilizing gemcitabine and cisplatin started in September 2018.  He received day 1 of cycle 1 on 05/02/2017 and therapy discontinued after that.  He is status post laparoscopic Cystoprostatectomy and bilateral lymphadenectomy done by Dr. Tresa Moore on 06/29/2017. Final pathology showed a T3bN0 disease.  He has 13 lymph nodes sampled without any evidence of malignancy.  He completed radiation therapy to the pelvis on January 16, 2018 under the care of Dr. Tammi Klippel.   Carboplatin and gemcitabine he is status post 6 cycles of therapy completed on 05/22/2018.   Current therapy: Pembrolizumab 200 mg every 3 weeks started on 06/21/2018.  He is here for the next cycle of therapy.  Interim History: Mr. Eastman resents today for a repeat evaluation.  Since the last visit, he reports no major changes in his health.  He tolerated Pembrolizumab without any major side effects except for dermatological toxicities.  He does develop occasional rash and pruritus that resolves between treatments.  He has applied topical steroids as well as antihistamines with improvement.  His hip pain has improved as well and is no longer using fentanyl patch at this time.  He does use breakthrough pain medication as needed utilizing Dilaudid for severe pain and oxycodone for moderate pain.  He ambulates without any major difficulties.   He denied any alteration mental status, neuropathy, confusion or dizziness.  Denies any headaches or lethargy.  Denies any night sweats, weight loss or changes in appetite.  Denied orthopnea, dyspnea on exertion or chest  discomfort.  Denies shortness of breath, difficulty breathing hemoptysis or cough.  Denies any abdominal distention, nausea, early satiety or dyspepsia.  Denies any hematuria, frequency, dysuria or nocturia.  Denies any skin irritation, dryness or rash.  Denies any ecchymosis or petechiae.  Denies any lymphadenopathy or clotting.  Denies any heat or cold intolerance.  Denies any anxiety or depression.  Remaining review of system is negative.     Medications: I have reviewed the patient's current medications.  Current Outpatient Medications  Medication Sig Dispense Refill  . acetaminophen (TYLENOL) 500 MG tablet Take 1,000 mg by mouth every 6 (six) hours as needed for fever.     . bisacodyl (DULCOLAX) 5 MG EC tablet Take 5 mg by mouth daily as needed for moderate constipation.    . camphor-menthol (SARNA) lotion Apply 1 application topically as needed for itching. 222 mL 0  . camphor-menthol (SARNA) lotion Apply 1 application topically as needed for itching. 222 mL 3  . diphenhydrAMINE (BENADRYL) 25 MG tablet Take 25 mg by mouth every 6 (six) hours as needed for itching.    . feeding supplement, ENSURE ENLIVE, (ENSURE ENLIVE) LIQD Take 237 mLs by mouth 2 (two) times daily between meals. 237 mL 12  . fentaNYL (DURAGESIC) 50 MCG/HR Place 1 patch onto the skin every 3 (three) days. 10 patch 0  . HYDROmorphone (DILAUDID) 4 MG tablet Take 1 tablet (4 mg total) by mouth every 4 (four) hours as needed for severe pain. 120 tablet 0  . hydrOXYzine (ATARAX/VISTARIL) 50 MG tablet Take 1 tablet (50 mg total) by mouth  4 (four) times daily as needed. (Patient not taking: Reported on 07/26/2018) 30 tablet 0  . lactose free nutrition (BOOST) LIQD Take 237 mLs by mouth 3 (three) times daily between meals.    Marland Kitchen oxyCODONE (OXY IR/ROXICODONE) 5 MG immediate release tablet Take 1-2 tablets (5-10 mg total) by mouth every 4 (four) hours as needed for severe pain. 120 tablet 0  . polyethylene glycol (MIRALAX / GLYCOLAX)  packet Take 17 g by mouth daily. (Patient not taking: Reported on 08/02/2018) 14 each 0  . prochlorperazine (COMPAZINE) 10 MG tablet Take 1 tablet (10 mg total) by mouth every 6 (six) hours as needed for nausea or vomiting. (Patient not taking: Reported on 08/02/2018) 30 tablet 0  . sulfamethoxazole-trimethoprim (BACTRIM DS,SEPTRA DS) 800-160 MG tablet Take 1 tablet by mouth 2 (two) times daily. 14 tablet 0   No current facility-administered medications for this visit.      Allergies:  Allergies  Allergen Reactions  . Ciprofloxacin Hives  . Cefepime Rash    Past Medical History, Surgical history, Social history, and Family History were reviewed and updated.  Physical Exam: Blood pressure (!) 144/94, pulse (!) 114, temperature 97.8 F (36.6 C), temperature source Oral, resp. rate 18, height 5\' 11"  (1.803 m), weight 193 lb 4.8 oz (87.7 kg), SpO2 98 %.   ECOG: 1     General appearance: Comfortable appearing without any discomfort Head: Normocephalic without any trauma Oropharynx: Mucous membranes are moist and pink without any thrush or ulcers. Eyes: Pupils are equal and round reactive to light. Lymph nodes: No cervical, supraclavicular, inguinal or axillary lymphadenopathy.   Heart:regular rate and rhythm.  S1 and S2 without leg edema. Lung: Clear without any rhonchi or wheezes.  No dullness to percussion. Abdomin: Soft, nontender, nondistended with good bowel sounds.  No hepatosplenomegaly. Musculoskeletal: No joint deformity or effusion.  Full range of motion noted. Neurological: No deficits noted on motor, sensory and deep tendon reflex exam. Skin: No petechial rash or dryness.  Appeared moist.               Lab Results: Lab Results  Component Value Date   WBC 8.1 12/13/2018   HGB 14.9 12/13/2018   HCT 45.5 12/13/2018   MCV 101.8 (H) 12/13/2018   PLT 238 12/13/2018     Chemistry      Component Value Date/Time   NA 141 11/22/2018 0811   NA 141 05/23/2017  0828   K 4.2 11/22/2018 0811   K 4.0 05/23/2017 0828   CL 104 11/22/2018 0811   CO2 26 11/22/2018 0811   CO2 27 05/23/2017 0828   BUN 21 11/22/2018 0811   BUN 12.5 05/23/2017 0828   CREATININE 0.91 11/22/2018 0811   CREATININE 0.8 05/23/2017 0828      Component Value Date/Time   CALCIUM 9.4 11/22/2018 0811   CALCIUM 9.0 05/23/2017 0828   ALKPHOS 68 11/22/2018 0811   ALKPHOS 57 05/23/2017 0828   AST 26 11/22/2018 0811   AST 13 05/23/2017 0828   ALT 50 (H) 11/22/2018 0811   ALT 15 05/23/2017 0828   BILITOT 0.3 11/22/2018 0811   BILITOT 0.37 05/23/2017 0828       Impression and Plan:  78 year old man with:   1.  Bladder cancer diagnosed in 2018 and subsequently developed stage IV disease with bone involvement in 2019.  He continues to tolerate Pembrolizumab without any major complications.  Risks and benefits of continuing this therapy was reviewed today.  Alternative options were also  discussed including resuming systemic chemotherapy.  After discussion today he is agreeable to proceed today and will repeat imaging studies before the next visit in 2019-01-11.  Therapy discontinuation because of dermatological toxicity may be possible at the time.  2. Hip pain: Manageable with the current pain medication.  He has discontinued fentanyl patch for the time being.  3.  Goals of care: Therapy remains palliative although his performance status is excellent and aggressive therapy is warranted.  4.  IV access: Veins appeared adequate at this time and does not require 40 cath insertion.  5.  Antiemetics: No nausea or vomiting reported at this time.  Demadex are available to him.  6.  Immune mediated side effects: He is experiencing mild dermatological toxicity that is manageable.  He has experienced more severe skin rash in the past was treated with oral methylprednisone.  7.  Follow-up: Will be in 3 weeks for an evaluation after the next imaging studies.  25  minutes was spent with  the patient face-to-face today.  More than 50% of time was spent on reviewing his disease status, treatment options and answering questions regarding future plan of care.  Zola Button, MD 4/29/20209:03 AM

## 2018-12-13 NOTE — Patient Instructions (Signed)
[facility] Discharge Instructions for Patients Receiving Chemotherapy  Today you received the following chemotherapy agent: Pembrolizumab Beryle Flock).  To help prevent nausea and vomiting after your treatment, we encourage you to take your nausea medication as prescribed.   If you develop nausea and vomiting that is not controlled by your nausea medication, call the clinic.   BELOW ARE SYMPTOMS THAT SHOULD BE REPORTED IMMEDIATELY:  *FEVER GREATER THAN 100.5 F  *CHILLS WITH OR WITHOUT FEVER  NAUSEA AND VOMITING THAT IS NOT CONTROLLED WITH YOUR NAUSEA MEDICATION  *UNUSUAL SHORTNESS OF BREATH  *UNUSUAL BRUISING OR BLEEDING  TENDERNESS IN MOUTH AND THROAT WITH OR WITHOUT PRESENCE OF ULCERS  *URINARY PROBLEMS  *BOWEL PROBLEMS  UNUSUAL RASH Items with * indicate a potential emergency and should be followed up as soon as possible.  Feel free to call the clinic should you have any questions or concerns. The clinic phone number is [phone].  Please show the Rew at check-in to the Emergency Department and triage nurse.  Coronavirus (COVID-19) Are you at risk?  Are you at risk for the Coronavirus (COVID-19)?  To be considered HIGH RISK for Coronavirus (COVID-19), you have to meet the following criteria:  . Traveled to [place], [place], [place], [place] or [place]; or in the [place] to [place], [place], [place], or [place]; and have fever, cough, and shortness of breath within the last 2 weeks of travel OR . Been in close contact with a person diagnosed with COVID-19 within the last 2 weeks and have fever, cough, and shortness of breath . IF YOU DO NOT MEET THESE CRITERIA, YOU ARE CONSIDERED LOW RISK FOR COVID-19.  What to do if you are HIGH RISK for COVID-19?  Marland Kitchen If you are having a medical emergency, call 911. . Seek medical care right away. Before you go to a doctor's office, urgent care or emergency department, call ahead and tell  them about your recent travel, contact with someone diagnosed with COVID-19, and your symptoms. You should receive instructions from your physician's office regarding next steps of care.  . When you arrive at healthcare provider, tell the healthcare staff immediately you have returned from visiting [place], [place], [place], [place] or [place]; or traveled in the [place] to [place], [place], [place], or [place]; in the last two weeks or you have been in close contact with a person diagnosed with COVID-19 in the last 2 weeks.   . Tell the health care staff about your symptoms: fever, cough and shortness of breath. . After you have been seen by a medical provider, you will be either: o Tested for (COVID-19) and discharged home on quarantine except to seek medical care if symptoms worsen, and asked to  - Stay home and avoid contact with others until you get your results (4-5 days)  - Avoid travel on public transportation if possible (such as bus, train, or airplane) or o Sent to the Emergency Department by EMS for evaluation, COVID-19 testing, and possible admission depending on your condition and test results.  What to do if you are LOW RISK for COVID-19?  Reduce your risk of any infection by using the same precautions used for avoiding the common cold or flu:  Marland Kitchen Wash your hands often with soap and warm water for at least 20 seconds.  If soap and water are not readily available, use an alcohol-based hand sanitizer with at least 60% alcohol.  . If coughing or  sneezing, cover your mouth and nose by coughing or sneezing into the elbow areas of your shirt or coat, into a tissue or into your sleeve (not your hands). . Avoid shaking hands with others and consider head nods or verbal greetings only. . Avoid touching your eyes, nose, or mouth with unwashed hands.  . Avoid close contact with people who are sick. . Avoid places or events with large numbers of people in one location, like concerts or  sporting events. . Carefully consider travel plans you have or are making. . If you are planning any travel outside or inside the [place], visit the CDC's Travelers' Health webpage for the latest health notices. . If you have some symptoms but not all symptoms, continue to monitor at home and seek medical attention if your symptoms worsen. . If you are having a medical emergency, call 911.   [place] / e-Visit: [email or web address]         [facility]: [facility]: [phone]                   [facility]: [phone]

## 2018-12-13 NOTE — Telephone Encounter (Signed)
Contacted patient pharmacy and called in prescription for methylprednisolone dose pack to pharmacist Tripp with read back.

## 2018-12-14 ENCOUNTER — Telehealth: Payer: Self-pay | Admitting: Oncology

## 2018-12-14 NOTE — Telephone Encounter (Signed)
No los per 4/29. °

## 2018-12-20 ENCOUNTER — Telehealth: Payer: Self-pay

## 2018-12-20 NOTE — Telephone Encounter (Signed)
Spoke to patient's daughter Lattie Haw and made her ware of Dr. Hazeline Junker instructions. She will call the office back if patient needs refills or Benadryl or Atarax.

## 2018-12-20 NOTE — Telephone Encounter (Signed)
-----   Message from [name], MD sent at 12/20/2018  2:57 PM EDT ----- No prednisone. He can use benadryl or Atarax. These can be refilled for him if needed.  ----- Message ----- From: [name], RN Sent: 12/20/2018   2:39 PM EDT To: [name], MD  Pt.'s daughter Lattie Haw called. She stated patient said during his visit last week he thought you said you would call in Prednisone for itching. She wants to make sure this is correct because patient gets confused sometimes. If so,she wants to know if you will send in a precscription to [facility] in [place]. Patient has been up the past couple of nights with constant itching.  [name]

## 2018-12-25 ENCOUNTER — Telehealth: Payer: Self-pay

## 2018-12-25 ENCOUNTER — Other Ambulatory Visit: Payer: Self-pay | Admitting: Oncology

## 2018-12-25 ENCOUNTER — Other Ambulatory Visit: Payer: Self-pay

## 2018-12-25 MED ORDER — METHYLPREDNISOLONE 4 MG PO TABS: ORAL_TABLET | ORAL | 0 refills | Status: DC

## 2018-12-25 NOTE — Telephone Encounter (Signed)
-----   Message from [name], MD sent at 12/25/2018  4:01 PM EDT ----- Medrol dose pack was called to him on 4/29. If he has not picked it up, he needs to. If needed it can be called again to [facility].  Thanks.  ----- Message ----- From: [name], RN Sent: 12/25/2018  12:33 PM EDT To: [name], MD  Patient's daughter called and stated patient still has constant itching. Nothing has improved since she called last week. Atarax and Benadryl are not helping at all. She states patient cannot continue like this and they want to know if there is an alternate treatment. They also want to know if there is anything else you can prescribe to help with the itching.  [name]

## 2018-12-25 NOTE — Telephone Encounter (Signed)
Called and made patient's daughter aware of prescription sent on 4/29. She verbalized understanding and will pick up medication for patient.

## 2019-01-01 ENCOUNTER — Other Ambulatory Visit: Payer: Self-pay | Admitting: Oncology

## 2019-01-01 DIAGNOSIS — C679 Malignant neoplasm of bladder, unspecified: Secondary | ICD-10-CM

## 2019-01-01 MED ORDER — HYDROMORPHONE HCL 4 MG PO TABS: 4.0000 mg | ORAL_TABLET | ORAL | 0 refills | Status: DC | PRN

## 2019-01-01 MED ORDER — OXYCODONE HCL 5 MG PO TABS: 5.0000 mg | ORAL_TABLET | ORAL | 0 refills | Status: DC | PRN

## 2019-01-03 ENCOUNTER — Inpatient Hospital Stay (HOSPITAL_BASED_OUTPATIENT_CLINIC_OR_DEPARTMENT_OTHER): Payer: Medicare Other | Admitting: Oncology

## 2019-01-03 ENCOUNTER — Inpatient Hospital Stay: Payer: Medicare Other | Attending: Oncology

## 2019-01-03 ENCOUNTER — Inpatient Hospital Stay: Payer: Medicare Other

## 2019-01-03 ENCOUNTER — Other Ambulatory Visit: Payer: Self-pay

## 2019-01-03 VITALS — BP 144/88 | HR 92 | Temp 98.1°F | Resp 18 | Ht 71.0 in | Wt 204.4 lb

## 2019-01-03 DIAGNOSIS — Z9221 Personal history of antineoplastic chemotherapy: Secondary | ICD-10-CM | POA: Diagnosis not present

## 2019-01-03 DIAGNOSIS — C679 Malignant neoplasm of bladder, unspecified: Secondary | ICD-10-CM | POA: Diagnosis not present

## 2019-01-03 DIAGNOSIS — G893 Neoplasm related pain (acute) (chronic): Secondary | ICD-10-CM | POA: Insufficient documentation

## 2019-01-03 DIAGNOSIS — Z923 Personal history of irradiation: Secondary | ICD-10-CM | POA: Diagnosis not present

## 2019-01-03 DIAGNOSIS — C7951 Secondary malignant neoplasm of bone: Secondary | ICD-10-CM | POA: Insufficient documentation

## 2019-01-03 DIAGNOSIS — M25559 Pain in unspecified hip: Secondary | ICD-10-CM

## 2019-01-03 DIAGNOSIS — Z79899 Other long term (current) drug therapy: Secondary | ICD-10-CM | POA: Insufficient documentation

## 2019-01-03 LAB — CBC WITH DIFFERENTIAL (CANCER CENTER ONLY)
Abs Immature Granulocytes: 0.13 10*3/uL — ABNORMAL HIGH (ref 0.00–0.07)
Basophils Absolute: 0 10*3/uL (ref 0.0–0.1)
Basophils Relative: 0 %
Eosinophils Absolute: 0.3 10*3/uL (ref 0.0–0.5)
Eosinophils Relative: 3 %
HCT: 43.3 % (ref 39.0–52.0)
Hemoglobin: 14.1 g/dL (ref 13.0–17.0)
Immature Granulocytes: 1 %
Lymphocytes Relative: 10 %
Lymphs Abs: 1 10*3/uL (ref 0.7–4.0)
MCH: 33.8 pg (ref 26.0–34.0)
MCHC: 32.6 g/dL (ref 30.0–36.0)
MCV: 103.8 fL — ABNORMAL HIGH (ref 80.0–100.0)
Monocytes Absolute: 0.8 10*3/uL (ref 0.1–1.0)
Monocytes Relative: 9 %
Neutro Abs: 6.9 10*3/uL (ref 1.7–7.7)
Neutrophils Relative %: 77 %
Platelet Count: 213 10*3/uL (ref 150–400)
RBC: 4.17 MIL/uL — ABNORMAL LOW (ref 4.22–5.81)
RDW: 14.5 % (ref 11.5–15.5)
WBC Count: 9.2 10*3/uL (ref 4.0–10.5)
nRBC: 0 % (ref 0.0–0.2)

## 2019-01-03 LAB — CMP (CANCER CENTER ONLY)
ALT: 36 U/L (ref 0–44)
AST: 18 U/L (ref 15–41)
Albumin: 2.7 g/dL — ABNORMAL LOW (ref 3.5–5.0)
Alkaline Phosphatase: 68 U/L (ref 38–126)
Anion gap: 8 (ref 5–15)
BUN: 12 mg/dL (ref 8–23)
CO2: 25 mmol/L (ref 22–32)
Calcium: 8.4 mg/dL — ABNORMAL LOW (ref 8.9–10.3)
Chloride: 108 mmol/L (ref 98–111)
Creatinine: 0.88 mg/dL (ref 0.61–1.24)
GFR, Est AFR Am: >60 mL/min (ref >60)
GFR, Estimated: >60 mL/min (ref >60)
Glucose, Bld: 94 mg/dL (ref 70–99)
Potassium: 4 mmol/L (ref 3.5–5.1)
Sodium: 141 mmol/L (ref 135–145)
Total Bilirubin: 0.3 mg/dL (ref 0.3–1.2)
Total Protein: 5.4 g/dL — ABNORMAL LOW (ref 6.5–8.1)

## 2019-01-03 NOTE — Progress Notes (Signed)
Hematology and Oncology Follow Up Visit  [name] [id number] June 13, 1941 78 y.o. 01/03/2019 10:17 AM [name], MDFusco, Purcell Nails, MD   Principle Diagnosis: 78 year old man with bladder cancer diagnosed in 2018.  He subsequently developed stage IV disease to the bone and the lung diagnosed in 2019.  His tumor is PDL 1 positive  with CPS score over 90%.     Prior Therapy:   He is status post neoadjuvant chemotherapy utilizing gemcitabine and cisplatin started in September 2018.  He received day 1 of cycle 1 on 05/02/2017 and therapy discontinued after that.  He is status post laparoscopic Cystoprostatectomy and bilateral lymphadenectomy done by Dr. Tresa Moore on 06/29/2017. Final pathology showed a T3bN0 disease.  He has 13 lymph nodes sampled without any evidence of malignancy.  He completed radiation therapy to the pelvis on January 16, 2018 under the care of Dr. Tammi Klippel.   Carboplatin and gemcitabine he is status post 6 cycles of therapy completed on 05/22/2018.   Current therapy: Pembrolizumab 200 mg every 3 weeks started on 06/21/2018.  He completed 8 cycles of therapy and April 2020.  Interim History: [name] returns today for a follow-up.  Since the last visit, he continues to have issues with pruritus related to Pembrolizumab.  He was prescribed a Medrol Dosepak which helped his symptoms significantly.  He is ambulating without any major difficulties in his hip pain is manageable.  He does not require any long-acting pain medication and does use short acting medication as needed.  Predominantly oxycodone rarely Dilaudid for severe pain.  Denies any recent falls or syncope.  His performance status and quality of life is improved after his itching has improved.   He denied headaches, blurry vision, syncope or seizures.  Denies any fevers, chills or sweats.  Denied chest pain, palpitation, orthopnea or leg edema.  Denied cough, wheezing or hemoptysis.  Denied nausea, vomiting or  abdominal pain.  Denies any constipation or diarrhea.  Denies any frequency urgency or hesitancy.  Denies any arthralgias or myalgias.  Denies any skin rashes or lesions.  Denies any bleeding or clotting tendency.  Denies any easy bruising.  Denies any hair or nail changes.  Denies any anxiety or depression.  Remaining review of system is negative. .      Medications: I have reviewed the patient's current medications.  Current Outpatient Medications  Medication Sig Dispense Refill  . acetaminophen (TYLENOL) 500 MG tablet Take 1,000 mg by mouth every 6 (six) hours as needed for fever.     . bisacodyl (DULCOLAX) 5 MG EC tablet Take 5 mg by mouth daily as needed for moderate constipation.    . camphor-menthol (SARNA) lotion Apply 1 application topically as needed for itching. 222 mL 0  . camphor-menthol (SARNA) lotion Apply 1 application topically as needed for itching. 222 mL 3  . diphenhydrAMINE (BENADRYL) 25 MG tablet Take 25 mg by mouth every 6 (six) hours as needed for itching.    . feeding supplement, ENSURE ENLIVE, (ENSURE ENLIVE) LIQD Take 237 mLs by mouth 2 (two) times daily between meals. 237 mL 12  . fentaNYL (DURAGESIC) 50 MCG/HR Place 1 patch onto the skin every 3 (three) days. 10 patch 0  . HYDROmorphone (DILAUDID) 4 MG tablet Take 1 tablet (4 mg total) by mouth every 4 (four) hours as needed for severe pain. 120 tablet 0  . hydrOXYzine (ATARAX/VISTARIL) 50 MG tablet Take 1 tablet (50 mg total) by mouth 4 (four) times daily as needed. (Patient not  taking: Reported on 07/26/2018) 30 tablet 0  . lactose free nutrition (BOOST) LIQD Take 237 mLs by mouth 3 (three) times daily between meals.    . methylPREDNISolone (MEDROL) 4 MG tablet Take 6 tablets for a day then,  5 tablets for a day,  4 tablets for a day,  3 tablets for a day,  2 tablets for a day,  1 tablet for a day and then stop. 21 tablet 0  . oxyCODONE (OXY IR/ROXICODONE) 5 MG immediate release tablet Take 1-2 tablets (5-10  mg total) by mouth every 4 (four) hours as needed for severe pain. 120 tablet 0  . polyethylene glycol (MIRALAX / GLYCOLAX) packet Take 17 g by mouth daily. (Patient not taking: Reported on 08/02/2018) 14 each 0  . prochlorperazine (COMPAZINE) 10 MG tablet Take 1 tablet (10 mg total) by mouth every 6 (six) hours as needed for nausea or vomiting. (Patient not taking: Reported on 08/02/2018) 30 tablet 0  . sulfamethoxazole-trimethoprim (BACTRIM DS,SEPTRA DS) 800-160 MG tablet Take 1 tablet by mouth 2 (two) times daily. 14 tablet 0   No current facility-administered medications for this visit.      Allergies:  Allergies  Allergen Reactions  . Ciprofloxacin Hives  . Cefepime Rash    Past Medical History, Surgical history, Social history, and Family History were reviewed and updated.  Physical Exam: Blood pressure (!) 144/88, pulse 92, temperature 98.1 F (36.7 C), temperature source Oral, resp. rate 18, height 5\' 11"  (1.803 m), weight 204 lb 6.4 oz (92.7 kg), SpO2 97 %.   ECOG: 1    General appearance: Alert, awake without any distress. Head: Atraumatic without abnormalities Oropharynx: Without any thrush or ulcers. Eyes: No scleral icterus. Lymph nodes: No lymphadenopathy noted in the cervical, supraclavicular, or axillary nodes Heart:regular rate and rhythm, without any murmurs or gallops.   Lung: Clear to auscultation without any rhonchi, wheezes or dullness to percussion. Abdomin: Soft, nontender without any shifting dullness or ascites. Musculoskeletal: No clubbing or cyanosis. Neurological: No motor or sensory deficits. Skin: No rashes or lesions. .               Lab Results: Lab Results  Component Value Date   WBC 9.2 01/03/2019   HGB 14.1 01/03/2019   HCT 43.3 01/03/2019   MCV 103.8 (H) 01/03/2019   PLT 213 01/03/2019     Chemistry      Component Value Date/Time   NA 140 12/13/2018 0854   NA 141 05/23/2017 0828   K 4.2 12/13/2018 0854   K 4.0  05/23/2017 0828   CL 107 12/13/2018 0854   CO2 23 12/13/2018 0854   CO2 27 05/23/2017 0828   BUN 17 12/13/2018 0854   BUN 12.5 05/23/2017 0828   CREATININE 0.93 12/13/2018 0854   CREATININE 0.8 05/23/2017 0828      Component Value Date/Time   CALCIUM 9.7 12/13/2018 0854   CALCIUM 9.0 05/23/2017 0828   ALKPHOS 87 12/13/2018 0854   ALKPHOS 57 05/23/2017 0828   AST 11 (L) 12/13/2018 0854   AST 13 05/23/2017 0828   ALT 23 12/13/2018 0854   ALT 15 05/23/2017 0828   BILITOT 0.4 12/13/2018 0854   BILITOT 0.37 05/23/2017 0828       Impression and Plan:  78 year old man with:   1.  Stage IV bladder cancer diagnosed in 2019.  He is status post Pembrolizumab for total of 8 cycles completed in April 2020.  Risks and benefits of resuming this therapy were  reviewed today.  Given his recurrent pruritus that has been rather debilitating for him, I have recommended holding his treatment for the time being and repeat imaging studies.  Staging work-up will be completed in the near future and depending on these results options of therapy were discussed.  One option would be a period of active surveillance versus different salvage therapy at that time.  After discussion today, he is agreeable to discontinue Pembrolizumab and repeat imaging studies and potentially treatment break after.  2. Hip pain: Related to metastatic cancer into the hip.  He is status post ablation which helped his pain.  Pain is manageable at this time with oxycodone and Dilaudid which refilled for him.  3.  Goals of care: Therapy remains palliative although aggressive therapy is warranted given his excellent performance status.  4.  Immune mediated side effects: Predominantly pruritus which has resolved at this time with Medrol Dosepak.  5.  Follow-up: We will be in the next 4 to 6 weeks after repeat imaging studies.  25  minutes was spent with the patient face-to-face today.  More than 50% of time was dedicated to  reviewing his laboratory data, previous imaging studies and discussing future treatment options.  Zola Button, MD 5/20/202010:17 AM

## 2019-01-04 ENCOUNTER — Other Ambulatory Visit: Payer: Self-pay

## 2019-01-04 ENCOUNTER — Telehealth: Payer: Self-pay

## 2019-01-04 ENCOUNTER — Telehealth: Payer: Self-pay | Admitting: Oncology

## 2019-01-04 MED ORDER — METHYLPREDNISOLONE 4 MG PO TABS: ORAL_TABLET | ORAL | 0 refills | Status: DC

## 2019-01-04 NOTE — Telephone Encounter (Signed)
Tried to reach regarding schedule °

## 2019-01-04 NOTE — Telephone Encounter (Signed)
Called and made patient's daughter Lattie Haw aware that prescription was sent to [facility]. She verbalized understanding.

## 2019-01-04 NOTE — Telephone Encounter (Signed)
-----   Message from [name], MD sent at 01/04/2019 11:23 AM EDT ----- Yes. Please call it in for him. Thanks.  ----- Message ----- From: [name], RN Sent: 01/04/2019  10:46 AM EDT To: [name], MD  Patient's daughter called and stated that during pt.'s visit yesterday, you said you would prescribe another medrol pack if needed. Patient is requesting this. Is is ok? [name]

## 2019-01-08 ENCOUNTER — Encounter: Payer: Self-pay | Admitting: Gastroenterology

## 2019-01-16 ENCOUNTER — Other Ambulatory Visit: Payer: Self-pay

## 2019-01-16 MED ORDER — CAMPHOR-MENTHOL 0.5-0.5 % EX LOTN: 1.0000 "application " | TOPICAL_LOTION | CUTANEOUS | 3 refills | Status: DC | PRN

## 2019-01-17 ENCOUNTER — Other Ambulatory Visit: Payer: Self-pay

## 2019-01-17 MED ORDER — METHYLPREDNISOLONE 4 MG PO TABS: ORAL_TABLET | ORAL | 0 refills | Status: DC

## 2019-01-17 NOTE — Telephone Encounter (Signed)
Received call from patient daughter in law stating that the patient has been c/o generalized itching and the Sarna lotion and Atarax have not been effective. She stated that the patient is requesting a refill of the Medrol dose pack. Okay to refill Medrol dose pack per Dr. Alen Blew. Refill request called in to patient pharmacy, spoke with Trip and order provided with read back. Contacted Lattie Haw and made aware that refill was called in to pharmacy.

## 2019-01-24 ENCOUNTER — Other Ambulatory Visit: Payer: Self-pay

## 2019-01-24 ENCOUNTER — Ambulatory Visit (HOSPITAL_COMMUNITY)
Admission: RE | Admit: 2019-01-24 | Discharge: 2019-01-24 | Disposition: A | Discharge status: Home / Self Care | Payer: Medicare Other | Source: Ambulatory Visit | Attending: Oncology | Admitting: Oncology

## 2019-01-24 DIAGNOSIS — C679 Malignant neoplasm of bladder, unspecified: Secondary | ICD-10-CM | POA: Insufficient documentation

## 2019-01-24 DIAGNOSIS — R918 Other nonspecific abnormal finding of lung field: Secondary | ICD-10-CM | POA: Diagnosis not present

## 2019-01-24 MED ORDER — IOHEXOL 300 MG/ML  SOLN
100.0000 mL | Freq: Once | INTRAMUSCULAR | Status: AC | PRN
  Administered 2019-01-24: 100 mL via INTRAVENOUS

## 2019-01-31 ENCOUNTER — Telehealth: Payer: Self-pay

## 2019-01-31 NOTE — Telephone Encounter (Signed)
-----   Message from [name], MD sent at 01/31/2019 10:52 AM EDT ----- Regarding: RE: scan results CT scan looks good. No new cancer and old spots are stable or reduced. No need for any treatment for now. Thanks.  ----- Message ----- From: Scot Dock, RN Sent: 01/31/2019  10:34 AM EDT To: [name], MD Subject: scan results                                   He is requesting results from scans on 6/10. Next appt 7/16

## 2019-01-31 NOTE — Telephone Encounter (Signed)
Contacted patient and made him aware of scans per Dr. Alen Blew. Patient was appreciative and aware of next appointment. No other questions or concerns.

## 2019-02-05 ENCOUNTER — Other Ambulatory Visit: Payer: Self-pay | Admitting: *Deleted

## 2019-02-05 ENCOUNTER — Telehealth: Payer: Self-pay | Admitting: *Deleted

## 2019-02-05 MED ORDER — METHYLPREDNISOLONE 4 MG PO TABS: ORAL_TABLET | ORAL | 0 refills | Status: DC

## 2019-02-05 NOTE — Telephone Encounter (Signed)
Daughter notified that refill sent and patient should call dermatologist as well

## 2019-02-05 NOTE — Telephone Encounter (Signed)
Daughter called to say [name] is "itching/broken out again and wants to get another dose pack". She is wondering if something else could be causing itching. Last treatment was in April 29.

## 2019-02-05 NOTE — Telephone Encounter (Signed)
Ok to give another dose pack. Unclear to me why he has this. He might need to see Dermatology.

## 2019-02-14 DIAGNOSIS — Z1389 Encounter for screening for other disorder: Secondary | ICD-10-CM | POA: Diagnosis not present

## 2019-02-14 DIAGNOSIS — Z6827 Body mass index (BMI) 27.0-27.9, adult: Secondary | ICD-10-CM | POA: Diagnosis not present

## 2019-02-14 DIAGNOSIS — L209 Atopic dermatitis, unspecified: Secondary | ICD-10-CM | POA: Diagnosis not present

## 2019-02-14 DIAGNOSIS — D709 Neutropenia, unspecified: Secondary | ICD-10-CM | POA: Diagnosis not present

## 2019-02-28 ENCOUNTER — Other Ambulatory Visit: Payer: Self-pay

## 2019-02-28 DIAGNOSIS — C679 Malignant neoplasm of bladder, unspecified: Secondary | ICD-10-CM

## 2019-03-01 ENCOUNTER — Other Ambulatory Visit: Payer: Self-pay | Admitting: Oncology

## 2019-03-01 ENCOUNTER — Telehealth: Payer: Self-pay | Admitting: Oncology

## 2019-03-01 ENCOUNTER — Inpatient Hospital Stay: Payer: Medicare Other

## 2019-03-01 ENCOUNTER — Inpatient Hospital Stay: Payer: Medicare Other | Attending: Oncology | Admitting: Oncology

## 2019-03-01 ENCOUNTER — Other Ambulatory Visit: Payer: Self-pay

## 2019-03-01 VITALS — BP 130/70 | HR 75 | Temp 98.7°F | Ht 71.0 in | Wt 201.3 lb

## 2019-03-01 DIAGNOSIS — C679 Malignant neoplasm of bladder, unspecified: Secondary | ICD-10-CM | POA: Diagnosis not present

## 2019-03-01 DIAGNOSIS — R918 Other nonspecific abnormal finding of lung field: Secondary | ICD-10-CM | POA: Diagnosis not present

## 2019-03-01 DIAGNOSIS — R222 Localized swelling, mass and lump, trunk: Secondary | ICD-10-CM | POA: Diagnosis not present

## 2019-03-01 DIAGNOSIS — N1330 Unspecified hydronephrosis: Secondary | ICD-10-CM | POA: Insufficient documentation

## 2019-03-01 DIAGNOSIS — Z923 Personal history of irradiation: Secondary | ICD-10-CM | POA: Diagnosis not present

## 2019-03-01 DIAGNOSIS — Z9221 Personal history of antineoplastic chemotherapy: Secondary | ICD-10-CM | POA: Diagnosis not present

## 2019-03-01 DIAGNOSIS — I2510 Atherosclerotic heart disease of native coronary artery without angina pectoris: Secondary | ICD-10-CM | POA: Insufficient documentation

## 2019-03-01 DIAGNOSIS — Z79899 Other long term (current) drug therapy: Secondary | ICD-10-CM | POA: Diagnosis not present

## 2019-03-01 LAB — CMP (CANCER CENTER ONLY)
ALT: 35 U/L (ref 0–44)
AST: 22 U/L (ref 15–41)
Albumin: 3.3 g/dL — ABNORMAL LOW (ref 3.5–5.0)
Alkaline Phosphatase: 71 U/L (ref 38–126)
Anion gap: 10 (ref 5–15)
BUN: 20 mg/dL (ref 8–23)
CO2: 24 mmol/L (ref 22–32)
Calcium: 8.9 mg/dL (ref 8.9–10.3)
Chloride: 106 mmol/L (ref 98–111)
Creatinine: 1.04 mg/dL (ref 0.61–1.24)
GFR, Est AFR Am: >60 mL/min (ref >60)
GFR, Estimated: >60 mL/min (ref >60)
Glucose, Bld: 106 mg/dL — ABNORMAL HIGH (ref 70–99)
Potassium: 4.9 mmol/L (ref 3.5–5.1)
Sodium: 140 mmol/L (ref 135–145)
Total Bilirubin: 0.5 mg/dL (ref 0.3–1.2)
Total Protein: 6.2 g/dL — ABNORMAL LOW (ref 6.5–8.1)

## 2019-03-01 LAB — CBC WITH DIFFERENTIAL (CANCER CENTER ONLY)
Abs Immature Granulocytes: 0.32 K/uL — ABNORMAL HIGH (ref 0.00–0.07)
Basophils Absolute: 0 K/uL (ref 0.0–0.1)
Basophils Relative: 0 %
Eosinophils Absolute: 0 K/uL (ref 0.0–0.5)
Eosinophils Relative: 0 %
HCT: 44.4 % (ref 39.0–52.0)
Hemoglobin: 14.9 g/dL (ref 13.0–17.0)
Immature Granulocytes: 2 %
Lymphocytes Relative: 6 %
Lymphs Abs: 0.9 K/uL (ref 0.7–4.0)
MCH: 35.8 pg — ABNORMAL HIGH (ref 26.0–34.0)
MCHC: 33.6 g/dL (ref 30.0–36.0)
MCV: 106.7 fL — ABNORMAL HIGH (ref 80.0–100.0)
Monocytes Absolute: 1.1 K/uL — ABNORMAL HIGH (ref 0.1–1.0)
Monocytes Relative: 7 %
Neutro Abs: 13.4 K/uL — ABNORMAL HIGH (ref 1.7–7.7)
Neutrophils Relative %: 85 %
Platelet Count: 274 K/uL (ref 150–400)
RBC: 4.16 MIL/uL — ABNORMAL LOW (ref 4.22–5.81)
RDW: 13.8 % (ref 11.5–15.5)
WBC Count: 15.8 K/uL — ABNORMAL HIGH (ref 4.0–10.5)
nRBC: 0 % (ref 0.0–0.2)

## 2019-03-01 MED ORDER — OXYCODONE HCL 5 MG PO TABS: 5.0000 mg | ORAL_TABLET | ORAL | 0 refills | Status: DC | PRN

## 2019-03-01 MED ORDER — PROCHLORPERAZINE MALEATE 10 MG PO TABS: 10.0000 mg | ORAL_TABLET | Freq: Four times a day (QID) | ORAL | 1 refills | Status: DC | PRN

## 2019-03-01 NOTE — Progress Notes (Signed)
Hematology and Oncology Follow Up Visit  [name] [id number] 11/06/1940 78 y.o. 03/01/2019 10:27 AM [name], MDFusco, Purcell Nails, MD   Principle Diagnosis: 78 year old man with stage IV bladder cancer with bone and lung involvement diagnosed in 2019. His tumor is PDL 1 positive  with CPS score over 90% after presenting with localized disease in 2018.   Prior Therapy:   He is status post neoadjuvant chemotherapy utilizing gemcitabine and cisplatin started in September 2018.  He received day 1 of cycle 1 on 05/02/2017 and therapy discontinued after that.  He is status post laparoscopic Cystoprostatectomy and bilateral lymphadenectomy done by Dr. Tresa Moore on 06/29/2017. Final pathology showed a T3bN0 disease.  He has 13 lymph nodes sampled without any evidence of malignancy.  He completed radiation therapy to the pelvis on January 16, 2018 under the care of Dr. Tammi Klippel.   Carboplatin and gemcitabine he is status post 6 cycles of therapy completed on 05/22/2018.  Pembrolizumab 200 mg every 3 weeks started on 06/21/2018.  He completed 8 cycles of therapy and April 2020.  Therapy discontinued because of dermatological toxicities.   Current therapy: Active surveillance.  Interim History: [name] is here for a repeat follow-up.  Since the last visit, he reports no major changes in his health.  He continues to have improvement in his right hip pain although not completely resolved at this time.  He continues to use Dilaudid as needed for breakthrough pain although not using any long-acting pain medication.  He still able to ambulate without any difficulties without any recent falls or syncope.  He continues to have issues with pruritus however.  He has been prescribed Medrol Dosepak with improvement in his symptoms.   Patient denied any alteration mental status, neuropathy, confusion or dizziness.  Denies any headaches or lethargy.  Denies any night sweats, weight loss or changes in appetite.   Denied orthopnea, dyspnea on exertion or chest discomfort.  Denies shortness of breath, difficulty breathing hemoptysis or cough.  Denies any abdominal distention, nausea, early satiety or dyspepsia.  Denies any hematuria, frequency, dysuria or nocturia.  Denies any skin irritation, dryness or rash.  Denies any ecchymosis or petechiae.  Denies any lymphadenopathy or clotting.  Denies any heat or cold intolerance.  Denies any anxiety or depression.  Remaining review of system is negative.      .      Medications: No changes by my review. Current Outpatient Medications  Medication Sig Dispense Refill  . acetaminophen (TYLENOL) 500 MG tablet Take 1,000 mg by mouth every 6 (six) hours as needed for fever.     . bisacodyl (DULCOLAX) 5 MG EC tablet Take 5 mg by mouth daily as needed for moderate constipation.    . camphor-menthol (SARNA) lotion Apply 1 application topically as needed for itching. 222 mL 0  . camphor-menthol (SARNA) lotion Apply 1 application topically as needed for itching. 222 mL 3  . diphenhydrAMINE (BENADRYL) 25 MG tablet Take 25 mg by mouth every 6 (six) hours as needed for itching.    . feeding supplement, ENSURE ENLIVE, (ENSURE ENLIVE) LIQD Take 237 mLs by mouth 2 (two) times daily between meals. 237 mL 12  . fentaNYL (DURAGESIC) 50 MCG/HR Place 1 patch onto the skin every 3 (three) days. 10 patch 0  . HYDROmorphone (DILAUDID) 4 MG tablet Take 1 tablet (4 mg total) by mouth every 4 (four) hours as needed for severe pain. 120 tablet 0  . hydrOXYzine (ATARAX/VISTARIL) 50 MG tablet Take 1  tablet (50 mg total) by mouth 4 (four) times daily as needed. (Patient not taking: Reported on 07/26/2018) 30 tablet 0  . lactose free nutrition (BOOST) LIQD Take 237 mLs by mouth 3 (three) times daily between meals.    . methylPREDNISolone (MEDROL) 4 MG tablet Take 6 tablets for a day then,  5 tablets for a day,  4 tablets for a day,  3 tablets for a day,  2 tablets for a day,  1 tablet  for a day and then stop. 21 tablet 0  . oxyCODONE (OXY IR/ROXICODONE) 5 MG immediate release tablet Take 1-2 tablets (5-10 mg total) by mouth every 4 (four) hours as needed for severe pain. 120 tablet 0  . polyethylene glycol (MIRALAX / GLYCOLAX) packet Take 17 g by mouth daily. (Patient not taking: Reported on 08/02/2018) 14 each 0  . prochlorperazine (COMPAZINE) 10 MG tablet Take 1 tablet (10 mg total) by mouth every 6 (six) hours as needed for nausea or vomiting. 30 tablet 1  . sulfamethoxazole-trimethoprim (BACTRIM DS,SEPTRA DS) 800-160 MG tablet Take 1 tablet by mouth 2 (two) times daily. 14 tablet 0   No current facility-administered medications for this visit.      Allergies:  Allergies  Allergen Reactions  . Ciprofloxacin Hives  . Cefepime Rash    Past Medical History, Surgical history, Social history, and Family History unchanged by my review.  Physical Exam: Blood pressure 130/70, pulse 75, temperature 98.7 F (37.1 C), temperature source Oral, height 5\' 11"  (1.803 m), weight 201 lb 4.8 oz (91.3 kg), SpO2 98 %.   ECOG: 1   General appearance: Comfortable appearing without any discomfort Head: Normocephalic without any trauma Oropharynx: Mucous membranes are moist and pink without any thrush or ulcers. Eyes: Pupils are equal and round reactive to light. Lymph nodes: No cervical, supraclavicular, inguinal or axillary lymphadenopathy.   Heart:regular rate and rhythm.  S1 and S2 without leg edema. Lung: Clear without any rhonchi or wheezes.  No dullness to percussion. Abdomin: Soft, nontender, nondistended with good bowel sounds.  No hepatosplenomegaly. Musculoskeletal: No joint deformity or effusion.  Full range of motion noted. Neurological: No deficits noted on motor, sensory and deep tendon reflex exam. Skin: No petechial rash or dryness.  Appeared moist.                Lab Results: Lab Results  Component Value Date   WBC 15.8 (H) 03/01/2019   HGB 14.9  03/01/2019   HCT 44.4 03/01/2019   MCV 106.7 (H) 03/01/2019   PLT 274 03/01/2019     Chemistry      Component Value Date/Time   NA 141 01/03/2019 0935   NA 141 05/23/2017 0828   K 4.0 01/03/2019 0935   K 4.0 05/23/2017 0828   CL 108 01/03/2019 0935   CO2 25 01/03/2019 0935   CO2 27 05/23/2017 0828   BUN 12 01/03/2019 0935   BUN 12.5 05/23/2017 0828   CREATININE 0.88 01/03/2019 0935   CREATININE 0.8 05/23/2017 0828      Component Value Date/Time   CALCIUM 8.4 (L) 01/03/2019 0935   CALCIUM 9.0 05/23/2017 0828   ALKPHOS 68 01/03/2019 0935   ALKPHOS 57 05/23/2017 0828   AST 18 01/03/2019 0935   AST 13 05/23/2017 0828   ALT 36 01/03/2019 0935   ALT 15 05/23/2017 0828   BILITOT 0.3 01/03/2019 0935   BILITOT 0.37 05/23/2017 0828     EXAM: CT CHEST, ABDOMEN, AND PELVIS WITH CONTRAST  TECHNIQUE:  Multidetector CT imaging of the chest, abdomen and pelvis was performed following the standard protocol during bolus administration of intravenous contrast.  CONTRAST:  150mL OMNIPAQUE IOHEXOL 300 MG/ML  SOLN  COMPARISON:  Multiple exams, including 09/11/2018  FINDINGS: CT CHEST FINDINGS  Cardiovascular: Coronary, aortic arch, and branch vessel atherosclerotic vascular disease.  Mediastinum/Nodes: No pathologic adenopathy in the chest.  Lungs/Pleura: Stable biapical pleuroparenchymal scarring. Airway thickening is present, suggesting bronchitis or reactive airways disease. Left upper lobe pulmonary nodule extending to the pleural surface measures 1.3 by 0.9 cm on image 41/4, stable. Fine peripheral reticulonodular opacity favoring the lung apices.  Reduced conspicuity of the prior 4 mm right lower lobe nodule, currently a small bandlike opacity on image 107/4.  Slightly less solid appearance of the 8 by 3 mm left lower lobe pulmonary nodule on image 121/4.  Musculoskeletal: Mild lower thoracic spondylosis.  CT ABDOMEN PELVIS FINDINGS  Hepatobiliary:  Stable sharply defined fluid density lesions in the liver favoring cysts. No new or significantly enlarging liver lesions. Contracted gallbladder.  Pancreas: Unremarkable  Spleen: Unremarkable  Adrenals/Urinary Tract: Stable hypodense lesion in the right kidney upper pole is technically too small to characterize at about 7 mm in diameter, but statistically likely to be a cyst. Mild left hydronephrosis and hydroureter extending to the ileal conduit. No significant asymmetry in enhancement. Cystoprostatectomy.  Stomach/Bowel: Prominent stool throughout the colon favors constipation.  Vascular/Lymphatic: Aortoiliac atherosclerotic vascular disease.  Reproductive: Cystoprostatectomy, otherwise unremarkable.  Other: No supplemental non-categorized findings.  Musculoskeletal: The lytic mass of the right inferior pubic ramus is observed with considerable soft tissue component, currently measuring 5.9 by 4.0 cm, previously 5.9 by 4.2 cm. Subjectively the appearance is not significantly changed, with large lytic component of the right inferior pubic ramus. Mild lumbar spondylosis and degenerative disc disease.  IMPRESSION: 1. The dominant left upper lobe pulmonary nodule is stable but the other smaller pulmonary nodules demonstrate reduced size/conspicuity. 2. Essentially stable lytic mass of the right inferior pubic ramus. 3. Mild left hydronephrosis with hydroureter extending down to the ileal pouch. Prior cystoprostatectomy. 4. Other imaging findings of potential clinical significance: Aortic Atherosclerosis (ICD10-I70.0). Coronary atherosclerosis. Airway thickening is present, suggesting bronchitis or reactive airways disease. Prominent stool throughout the colon favors constipation.    Impression and Plan:  78 year old man with:   1.  Bladder cancer diagnosed in 2018 subsequently developed stage IV disease with pelvic and pulmonary nodules.  CT scan obtained  on 01/24/2019 was personally reviewed and discussed with the patient.  Imaging studies were also shared with the patient.  His disease continues to be very minimal at this time with continued regression off immunotherapy.  The natural course of this disease as well as treatment options were discussed.  At this time I have opted to continue with active surveillance given his minimal disease and his dermatological toxicity associated with Pembrolizumab.  I recommended proceeding with salvage therapy if he develops progression of disease next scans.  2. Hip pain: He is currently using Dilaudid as needed will be refilled for him as needed.  3.  Goals of care: Disease remains incurable although aggressive measures are warranted.  4.  Pruritus: Continues to be an issue I have recommended referral to allergy specialist.  His primary care physician is proceeding without.  5.  Follow-up: in 3 months for repeat evaluation.  25  minutes was spent with the patient face-to-face today.  More than 50% of time was spent on reviewing his imaging studies, disease status update,  treatment options and answering questions regarding future plan of care.  Zola Button, MD 7/16/202010:27 AM

## 2019-03-01 NOTE — Telephone Encounter (Signed)
Scheduled appt 7/16 los.  Printed calendar and avs, and gave the patient the number to central radiology.

## 2019-03-09 ENCOUNTER — Emergency Department (HOSPITAL_COMMUNITY)
Admission: EM | Admit: 2019-03-09 | Discharge: 2019-03-09 | Disposition: A | Discharge status: Home / Self Care | Payer: Medicare Other | Attending: Emergency Medicine | Admitting: Emergency Medicine

## 2019-03-09 ENCOUNTER — Emergency Department (HOSPITAL_COMMUNITY): Payer: Medicare Other

## 2019-03-09 ENCOUNTER — Other Ambulatory Visit: Payer: Self-pay

## 2019-03-09 ENCOUNTER — Encounter (HOSPITAL_COMMUNITY): Payer: Self-pay | Admitting: Emergency Medicine

## 2019-03-09 DIAGNOSIS — Z8551 Personal history of malignant neoplasm of bladder: Secondary | ICD-10-CM | POA: Diagnosis not present

## 2019-03-09 DIAGNOSIS — N3001 Acute cystitis with hematuria: Secondary | ICD-10-CM | POA: Diagnosis not present

## 2019-03-09 DIAGNOSIS — Z85830 Personal history of malignant neoplasm of bone: Secondary | ICD-10-CM | POA: Diagnosis not present

## 2019-03-09 DIAGNOSIS — Z79899 Other long term (current) drug therapy: Secondary | ICD-10-CM | POA: Diagnosis not present

## 2019-03-09 DIAGNOSIS — Z20828 Contact with and (suspected) exposure to other viral communicable diseases: Secondary | ICD-10-CM | POA: Diagnosis not present

## 2019-03-09 DIAGNOSIS — Z87891 Personal history of nicotine dependence: Secondary | ICD-10-CM | POA: Diagnosis not present

## 2019-03-09 DIAGNOSIS — R531 Weakness: Secondary | ICD-10-CM | POA: Diagnosis not present

## 2019-03-09 LAB — CBC WITH DIFFERENTIAL/PLATELET
Abs Immature Granulocytes: 0.09 10*3/uL — ABNORMAL HIGH (ref 0.00–0.07)
Basophils Absolute: 0 10*3/uL (ref 0.0–0.1)
Basophils Relative: 0 %
Eosinophils Absolute: 0 10*3/uL (ref 0.0–0.5)
Eosinophils Relative: 0 %
HCT: 39.9 % (ref 39.0–52.0)
Hemoglobin: 13.2 g/dL (ref 13.0–17.0)
Immature Granulocytes: 1 %
Lymphocytes Relative: 4 %
Lymphs Abs: 0.5 10*3/uL — ABNORMAL LOW (ref 0.7–4.0)
MCH: 35.8 pg — ABNORMAL HIGH (ref 26.0–34.0)
MCHC: 33.1 g/dL (ref 30.0–36.0)
MCV: 108.1 fL — ABNORMAL HIGH (ref 80.0–100.0)
Monocytes Absolute: 1.9 10*3/uL — ABNORMAL HIGH (ref 0.1–1.0)
Monocytes Relative: 14 %
Neutro Abs: 11.1 10*3/uL — ABNORMAL HIGH (ref 1.7–7.7)
Neutrophils Relative %: 81 %
Platelets: 148 10*3/uL — ABNORMAL LOW (ref 150–400)
RBC: 3.69 MIL/uL — ABNORMAL LOW (ref 4.22–5.81)
RDW: 13.2 % (ref 11.5–15.5)
WBC: 13.6 10*3/uL — ABNORMAL HIGH (ref 4.0–10.5)
nRBC: 0 % (ref 0.0–0.2)

## 2019-03-09 LAB — URINALYSIS, ROUTINE W REFLEX MICROSCOPIC
Bacteria, UA: NONE SEEN
Bilirubin Urine: NEGATIVE
Glucose, UA: NEGATIVE mg/dL
Ketones, ur: NEGATIVE mg/dL
Nitrite: NEGATIVE
Protein, ur: NEGATIVE mg/dL
Specific Gravity, Urine: 1.011 (ref 1.005–1.030)
pH: 7 (ref 5.0–8.0)

## 2019-03-09 LAB — SARS CORONAVIRUS 2 BY RT PCR (HOSPITAL ORDER, PERFORMED IN ~~LOC~~ HOSPITAL LAB): SARS Coronavirus 2: NEGATIVE

## 2019-03-09 LAB — HEPATIC FUNCTION PANEL
ALT: 33 U/L (ref 0–44)
AST: 42 U/L — ABNORMAL HIGH (ref 15–41)
Albumin: 2.7 g/dL — ABNORMAL LOW (ref 3.5–5.0)
Alkaline Phosphatase: 46 U/L (ref 38–126)
Bilirubin, Direct: 0.2 mg/dL (ref 0.0–0.2)
Indirect Bilirubin: 0.7 mg/dL (ref 0.3–0.9)
Total Bilirubin: 0.9 mg/dL (ref 0.3–1.2)
Total Protein: 6.1 g/dL — ABNORMAL LOW (ref 6.5–8.1)

## 2019-03-09 LAB — BASIC METABOLIC PANEL
Anion gap: 9 (ref 5–15)
BUN: 23 mg/dL (ref 8–23)
CO2: 27 mmol/L (ref 22–32)
Calcium: 8.4 mg/dL — ABNORMAL LOW (ref 8.9–10.3)
Chloride: 99 mmol/L (ref 98–111)
Creatinine, Ser: 1.01 mg/dL (ref 0.61–1.24)
GFR calc Af Amer: >60 mL/min (ref >60)
GFR calc non Af Amer: >60 mL/min (ref >60)
Glucose, Bld: 117 mg/dL — ABNORMAL HIGH (ref 70–99)
Potassium: 4 mmol/L (ref 3.5–5.1)
Sodium: 135 mmol/L (ref 135–145)

## 2019-03-09 LAB — TROPONIN I (HIGH SENSITIVITY)
Troponin I (High Sensitivity): 5 ng/L (ref <18)
Troponin I (High Sensitivity): 5 ng/L (ref <18)

## 2019-03-09 LAB — CBG MONITORING, ED: Glucose-Capillary: 112 mg/dL — ABNORMAL HIGH (ref 70–99)

## 2019-03-09 MED ORDER — SULFAMETHOXAZOLE-TRIMETHOPRIM 800-160 MG PO TABS
1.0000 | ORAL_TABLET | Freq: Once | ORAL | Status: AC
  Administered 2019-03-09: 1 via ORAL
  Filled 2019-03-09: qty 1

## 2019-03-09 MED ORDER — SODIUM CHLORIDE 0.9 % IV BOLUS
500.0000 mL | Freq: Once | INTRAVENOUS | Status: AC
  Administered 2019-03-09: 500 mL via INTRAVENOUS

## 2019-03-09 MED ORDER — SULFAMETHOXAZOLE-TRIMETHOPRIM 800-160 MG PO TABS: 1.0000 | ORAL_TABLET | Freq: Two times a day (BID) | ORAL | 0 refills | Status: AC

## 2019-03-09 NOTE — ED Provider Notes (Signed)
[facility] EMERGENCY DEPARTMENT Provider Note   CSN: [id number] Arrival date & time: 03/09/19  1025     History   Chief Complaint Chief Complaint  Patient presents with  . Fatigue    X2 days    HPI [name] is a 78 y.o. male.     HPI  [name] is a 78 y.o. male with hx of bladder cancer with mets to bone and lung, who presents to the Emergency Department complaining of generalized weakness and fatigue for 2-3 days.  Symptoms also associated with decreased appetite. He is status post chemotherapy, but continues to have f/u visits with oncology.  He denies pain, shortness of breath, fever, chills, abdominal pain, nausea or vomiting.  He has an indwelling catheter  and states that last time he felt like this he had a UTI that led to sepsis and he wanted to be evaluated this time before his symptoms worsened.  No known COVID exposures.   Past Medical History:  Diagnosis Date  . Bladder cancer ([place])   . BPH (benign prostatic hypertrophy)   . Dysuria   . GERD (gastroesophageal reflux disease)   . History of adenomatous polyp of colon    tubular adenoma 06/ 2018  . History of bladder cancer 12/2014   urologist-  dr [name]--  dx High Grade TCC without stromal invasion s/p TURBT and intravesical BCG tx/  recurrent 07/2015 (pTa) TCC  repear intravesical BCG tx   . History of hiatal hernia   . History of urethral stricture   . Nocturia     Patient Active Problem List   Diagnosis Date Noted  . Redness and swelling of thigh 06/05/2018  . Adverse drug reaction 06/05/2018  . Bacteremia associated with intravascular line ([place]) 04/20/2018  . Febrile neutropenia ([place]) 04/13/2018  . Gram positive sepsis ([place]) 04/13/2018  . SIRS (systemic inflammatory response syndrome) ([place]) 04/12/2018  . Bacteremia due to Gram-positive bacteria 04/12/2018  . Pancytopenia ([place]) 04/12/2018  . Status post chemotherapy   . UTI (urinary tract infection) 04/11/2018  . Port-A-Cath in place  02/20/2018  . Encounter for antineoplastic chemotherapy 02/17/2018  . Sepsis due to urinary tract infection ([place]) 01/25/2018  . Sepsis secondary to UTI ([place]) 01/25/2018  . Hyperglycemia   . Goals of care, counseling/discussion 01/18/2018  . Bone metastasis ([place]) 12/29/2017  . Status post ileal conduit ([place]) 06/29/2017  . Bladder cancer ([place]) 06/28/2017  . Malignant neoplasm of urinary bladder ([place]) 04/20/2017  . COPD UNSPECIFIED 03/07/2008    Past Surgical History:  Procedure Laterality Date  . CARDIAC CATHETERIZATION  1997   normal coronary arteries  . CARDIOVASCULAR STRESS TEST  12-14-2005   Low risk perfusion study/  very small scar in the inferior septum from mid ventricle to apex,  no ischemia/  mild inferior septal hypokinesis/  ef 58%  . CATARACT EXTRACTION W/ INTRAOCULAR LENS  IMPLANT, BILATERAL  2011  . COLONOSCOPY  last one 06/ 2018  . CYSTOSCOPY WITH BIOPSY N/A 08/12/2015   Procedure: CYSTOSCOPY WITH BIOPSY AND FULGERATION;  Surgeon: [name], MD;  Location: [facility];  Service: Urology;  Laterality: N/A;  . CYSTOSCOPY WITH INJECTION N/A 06/29/2017   Procedure: CYSTOSCOPY WITH INJECTION OF INDOCYANINE GREEN DYE;  Surgeon: Alexis Frock, MD;  Location: [facility];  Service: Urology;  Laterality: N/A;  . CYSTOSCOPY WITH URETHRAL DILATATION N/A 03/21/2017   Procedure: CYSTOSCOPY;  Surgeon: [name], MD;  Location: [facility];  Service: Urology;  Laterality: N/A;  . ESOPHAGOGASTRODUODENOSCOPY  12-05-2014  . IR IMAGING GUIDED PORT INSERTION  02/07/2018  . IR REMOVAL TUN ACCESS W/ PORT W/O FL MOD SED  04/14/2018  . TRANSTHORACIC ECHOCARDIOGRAM  01-31-2008   nomral LVF,  ef 55-65%/  mild pulmonic stenosis without regurg.,  peak transpulomonic valve gradient 62mmHg  . TRANSURETHRAL RESECTION OF BLADDER TUMOR N/A 12/31/2014   Procedure: TRANSURETHRAL RESECTION OF BLADDER TUMOR (TURBT);  Surgeon: Lowella Bandy, MD;  Location: [facility];  Service: Urology;  Laterality: N/A;  . TRANSURETHRAL RESECTION OF BLADDER TUMOR N/A 03/21/2017   Procedure: POSSIBLE TRANSURETHRAL RESECTION OF BLADDER TUMOR (TURBT);  Surgeon: [name], MD;  Location: [facility];  Service: Urology;  Laterality: N/A;        Home Medications    Prior to Admission medications   Medication Sig Start Date End Date Taking? Authorizing Provider  acetaminophen (TYLENOL) 500 MG tablet Take 1,000 mg by mouth every 6 (six) hours as needed for fever.     [provider]  bisacodyl (DULCOLAX) 5 MG EC tablet Take 5 mg by mouth daily as needed for moderate constipation.    [provider]  camphor-menthol Timoteo Ace) lotion Apply 1 application topically as needed for itching. 01/16/19   [name], MD  diphenhydrAMINE (BENADRYL) 25 MG tablet Take 25 mg by mouth every 6 (six) hours as needed for itching.    [provider]  feeding supplement, ENSURE ENLIVE, (ENSURE ENLIVE) LIQD Take 237 mLs by mouth 2 (two) times daily between meals. 04/18/18   Orson Eva, MD  fentaNYL (DURAGESIC) 50 MCG/HR Place 1 patch onto the skin every 3 (three) days. 11/22/18   [name], MD  HYDROmorphone (DILAUDID) 4 MG tablet Take 1 tablet (4 mg total) by mouth every 4 (four) hours as needed for severe pain. 01/01/19   [name], MD  lactose free nutrition (BOOST) LIQD Take 237 mLs by mouth 3 (three) times daily between meals.    [provider]  oxyCODONE (OXY IR/ROXICODONE) 5 MG immediate release tablet Take 1-2 tablets (5-10 mg total) by mouth every 4 (four) hours as needed for severe pain. 03/01/19   [name], MD  prochlorperazine (COMPAZINE) 10 MG tablet Take 1 tablet (10 mg total) by mouth every 6 (six) hours as needed for nausea or vomiting. 03/01/19   [name], MD    Family History Family History  Problem Relation Age of Onset  . Alcoholism Father   . Colon cancer Neg Hx   . Colon polyps  Neg Hx   . Diabetes Neg Hx   . Kidney disease Neg Hx   . Esophageal cancer Neg Hx   . Heart disease Neg Hx   . Gallbladder disease Neg Hx     Social History Social History   Tobacco Use  . Smoking status: Former Smoker    Packs/day: 1.00    Years: 34.00    Pack years: 34.00    Types: Cigarettes    Quit date: 12/26/1988    Years since quitting: 30.2  . Smokeless tobacco: Never Used  Substance Use Topics  . Alcohol use: Yes    Alcohol/week: 6.0 standard drinks    Types: 6 Cans of beer per week    Comment: BEER  . Drug use: No     Allergies   Ciprofloxacin and Cefepime   Review of Systems Review of Systems  Constitutional: Positive for fatigue. Negative for chills and  fever.  HENT: Negative for congestion, sore throat and trouble swallowing.   Respiratory: Negative for cough, shortness of breath and wheezing.   Cardiovascular: Negative for chest pain and palpitations.  Gastrointestinal: Negative for abdominal pain, diarrhea, nausea and vomiting.  Genitourinary: Negative for decreased urine volume, dysuria, flank pain and hematuria.  Musculoskeletal: Negative for arthralgias, back pain, myalgias, neck pain and neck stiffness.  Skin: Negative for rash.  Neurological: Negative for dizziness, weakness and numbness.  Hematological: Does not bruise/bleed easily.  Psychiatric/Behavioral: Negative for decreased concentration.     Physical Exam Updated Vital Signs BP 119/78   Pulse 88   Temp 98.1 F (36.7 C) (Oral)   Resp 20   Ht 5\' 11"  (1.803 m)   Wt 90.7 kg   SpO2 95%   BMI 27.89 kg/m   Physical Exam Vitals signs and nursing note reviewed.  Constitutional:      Appearance: Normal appearance. He is not ill-appearing or toxic-appearing.  HENT:     Head: Normocephalic.     Mouth/Throat:     Mouth: Mucous membranes are moist.     Pharynx: Oropharynx is clear.  Neck:     Musculoskeletal: Normal range of motion. No muscular tenderness.     Thyroid: No  thyromegaly.     Meningeal: Kernig's sign absent.  Cardiovascular:     Rate and Rhythm: Normal rate and regular rhythm.     Heart sounds: Normal heart sounds.  Pulmonary:     Effort: Pulmonary effort is normal.     Breath sounds: Normal breath sounds. No wheezing.  Abdominal:     General: There is no distension.     Palpations: Abdomen is soft.     Tenderness: There is no abdominal tenderness. There is no right CVA tenderness, left CVA tenderness, guarding or rebound.     Comments: Pt has indwelling catheter with dark yellow colored urine in the catheter bag.    Musculoskeletal: Normal range of motion.     Right lower leg: No edema.     Left lower leg: No edema.  Skin:    General: Skin is warm.     Findings: No rash.  Neurological:     General: No focal deficit present.     Mental Status: He is alert.     GCS: GCS eye subscore is 4. GCS verbal subscore is 5. GCS motor subscore is 6.     Sensory: Sensation is intact. No sensory deficit.     Motor: Motor function is intact. No weakness.     Coordination: Coordination is intact.     Comments: CN II-XII intact.  Speech clear      ED Treatments / Results  Labs (all labs ordered are listed, but only abnormal results are displayed) Labs Reviewed  BASIC METABOLIC PANEL - Abnormal; Notable for the following components:      Result Value   Glucose, Bld 117 (*)    Calcium 8.4 (*)    All other components within normal limits  URINALYSIS, ROUTINE W REFLEX MICROSCOPIC - Abnormal; Notable for the following components:   APPearance HAZY (*)    Hgb urine dipstick SMALL (*)    Leukocytes,Ua TRACE (*)    Non Squamous Epithelial 0-5 (*)    All other components within normal limits  HEPATIC FUNCTION PANEL - Abnormal; Notable for the following components:   Total Protein 6.1 (*)    Albumin 2.7 (*)    AST 42 (*)    All other components within  normal limits  CBC WITH DIFFERENTIAL/PLATELET - Abnormal; Notable for the following components:    WBC 13.6 (*)    RBC 3.69 (*)    MCV 108.1 (*)    MCH 35.8 (*)    Platelets 148 (*)    Neutro Abs 11.1 (*)    Lymphs Abs 0.5 (*)    Monocytes Absolute 1.9 (*)    Abs Immature Granulocytes 0.09 (*)    All other components within normal limits  CBG MONITORING, ED - Abnormal; Notable for the following components:   Glucose-Capillary 112 (*)    All other components within normal limits  SARS CORONAVIRUS 2 (HOSPITAL ORDER, [facility])  URINE CULTURE  TROPONIN I (HIGH SENSITIVITY)  TROPONIN I (HIGH SENSITIVITY)    EKG EKG Interpretation  Date/Time:  Friday March 09 2019 10:48:07 EDT Ventricular Rate:  89 PR Interval:    QRS Duration: 80 QT Interval:  355 QTC Calculation: 432 R Axis:   -47 Text Interpretation:  Sinus rhythm Left anterior fascicular block Baseline wander in lead(s) II Confirmed by Milton Ferguson [phone]) on 03/09/2019 2:37:36 PM   Radiology Dg Chest Portable 1 View  Result Date: 03/09/2019 CLINICAL DATA:  Weakness EXAM: PORTABLE CHEST 1 VIEW COMPARISON:  07/26/2018 FINDINGS: Cardiomediastinal silhouette is within normal limits. Lungs are clear without focal consolidation, pleural effusion, or pneumothorax. IMPRESSION: No active disease. Electronically Signed   By: Davina Poke M.D.   On: 03/09/2019 11:34    Procedures Procedures (including critical care time)  Medications Ordered in ED Medications  sodium chloride 0.9 % bolus 500 mL (0 mLs Intravenous Stopped 03/09/19 1214)     Initial Impression / Assessment and Plan / ED Course  I have reviewed the triage vital signs and the nursing notes.  Pertinent labs & imaging results that were available during my care of the patient were reviewed by me and considered in my medical decision making (see chart for details).       Pt with generalized weakness for 2 days.  Decreased appetite.  Denies pain.  Overall, he is well-appearing and nontoxic.  Will obtain labs and chest x-ray.  No  concerning symptoms for acute abdomen.  Work-up today reassuring.  No concerning sx's for sepsis.  COVID test negative.  Patient does appear to have UTI which is recurrent for him.  No concern for pyelonephritis or kidney stone.  Will treat with antibiotics and urine cultures pending.  Patient also seen by Dr. Roderic Palau and care plan discussed. Pt agrees to tx plan and close PCP f/u.  Strict return precautions discussed.   Final Clinical Impressions(s) / ED Diagnoses   Final diagnoses:  Acute cystitis with hematuria    ED Discharge Orders    None       Bufford Lope 03/10/19 2222    Milton Ferguson, MD 03/14/19 [phone]

## 2019-03-09 NOTE — Discharge Instructions (Addendum)
Be sure to take the antibiotic as directed until its finished.  You may take Tylenol every 4 hours if needed for pain or fever.  Drink plenty of fluids.  Follow-up with your primary provider for recheck.  Return to the ER for any worsening symptoms such as vomiting, abdominal pain, or fever

## 2019-03-09 NOTE — ED Triage Notes (Signed)
C/o weakness for last 2 days, poor oral intake.  Denies any pain.

## 2019-03-10 LAB — URINE CULTURE

## 2019-03-15 ENCOUNTER — Other Ambulatory Visit: Payer: Self-pay

## 2019-03-15 MED ORDER — PREDNISONE 10 MG (21) PO TBPK: ORAL_TABLET | ORAL | 0 refills | Status: DC

## 2019-03-15 NOTE — Telephone Encounter (Signed)
Received call from patient daughter-in-law requesting that another refill of the prednisone dose pack be sent to [facility] because pt has started "itching" again. Verbal order received for prednisone dose pack. Order sent and Lattie Haw made aware.

## 2019-03-16 DIAGNOSIS — Z79899 Other long term (current) drug therapy: Secondary | ICD-10-CM | POA: Diagnosis not present

## 2019-03-16 DIAGNOSIS — G893 Neoplasm related pain (acute) (chronic): Secondary | ICD-10-CM | POA: Diagnosis not present

## 2019-03-16 DIAGNOSIS — Z5181 Encounter for therapeutic drug level monitoring: Secondary | ICD-10-CM | POA: Diagnosis not present

## 2019-03-16 DIAGNOSIS — M25551 Pain in right hip: Secondary | ICD-10-CM | POA: Diagnosis not present

## 2019-03-16 DIAGNOSIS — C7951 Secondary malignant neoplasm of bone: Secondary | ICD-10-CM | POA: Diagnosis not present

## 2019-03-20 ENCOUNTER — Other Ambulatory Visit: Payer: Self-pay | Admitting: Pain Medicine

## 2019-03-20 ENCOUNTER — Other Ambulatory Visit (HOSPITAL_COMMUNITY): Payer: Self-pay | Admitting: Pain Medicine

## 2019-03-20 DIAGNOSIS — C7951 Secondary malignant neoplasm of bone: Secondary | ICD-10-CM

## 2019-03-20 DIAGNOSIS — G893 Neoplasm related pain (acute) (chronic): Secondary | ICD-10-CM

## 2019-03-26 ENCOUNTER — Encounter (HOSPITAL_COMMUNITY): Payer: Self-pay

## 2019-03-26 ENCOUNTER — Ambulatory Visit (HOSPITAL_COMMUNITY): Payer: Medicare Other

## 2019-04-03 ENCOUNTER — Other Ambulatory Visit: Payer: Self-pay | Admitting: Oncology

## 2019-04-03 DIAGNOSIS — C679 Malignant neoplasm of bladder, unspecified: Secondary | ICD-10-CM

## 2019-04-03 MED ORDER — OXYCODONE HCL 5 MG PO TABS: 5.0000 mg | ORAL_TABLET | ORAL | 0 refills | Status: DC | PRN

## 2019-04-06 DIAGNOSIS — H10812 Pingueculitis, left eye: Secondary | ICD-10-CM | POA: Diagnosis not present

## 2019-04-10 DIAGNOSIS — M25551 Pain in right hip: Secondary | ICD-10-CM | POA: Diagnosis not present

## 2019-04-10 DIAGNOSIS — C679 Malignant neoplasm of bladder, unspecified: Secondary | ICD-10-CM | POA: Diagnosis not present

## 2019-04-24 ENCOUNTER — Telehealth: Payer: Self-pay

## 2019-04-24 ENCOUNTER — Other Ambulatory Visit: Payer: Self-pay

## 2019-04-24 ENCOUNTER — Inpatient Hospital Stay: Payer: Medicare Other | Attending: Oncology | Admitting: Medical

## 2019-04-24 VITALS — BP 140/80 | HR 85 | Temp 99.1°F | Resp 17 | Ht 71.0 in | Wt 202.3 lb

## 2019-04-24 DIAGNOSIS — R319 Hematuria, unspecified: Secondary | ICD-10-CM | POA: Diagnosis not present

## 2019-04-24 DIAGNOSIS — C679 Malignant neoplasm of bladder, unspecified: Secondary | ICD-10-CM

## 2019-04-24 DIAGNOSIS — N390 Urinary tract infection, site not specified: Secondary | ICD-10-CM

## 2019-04-24 DIAGNOSIS — C7951 Secondary malignant neoplasm of bone: Secondary | ICD-10-CM | POA: Diagnosis not present

## 2019-04-24 MED ORDER — SULFAMETHOXAZOLE-TRIMETHOPRIM 800-160 MG PO TABS: 1.0000 | ORAL_TABLET | Freq: Two times a day (BID) | ORAL | 0 refills | Status: DC

## 2019-04-24 NOTE — Telephone Encounter (Signed)
Called daughter in law Lattie Haw back and explained that the patient will be seen by Lucianne Lei PA with [facility] and that a scheduler will call her and let her know the earliest appt available. Lattie Haw verbalized understanding. Scheduling message sent.

## 2019-04-24 NOTE — Telephone Encounter (Signed)
-----   Message from [name], MD sent at 04/24/2019 12:19 PM EDT ----- Regarding: RE: pt concerns Yes please. Thanks ----- Message ----- From: Scot Dock, RN Sent: 04/24/2019  12:08 PM EDT To: [name], MD Subject: pt concerns                                    Pt daughter in law called. Pt isn't feeling well and urine is darker than usual and he feels that he has a UTI.  PCP only doing virtual visits. Should I set him up for symptom management?

## 2019-04-24 NOTE — Patient Instructions (Signed)
COVID-19: How to Protect Yourself and Others Know how it spreads  There is currently no vaccine to prevent coronavirus disease 2019 (COVID-19).  The best way to prevent illness is to avoid being exposed to this virus.  The virus is thought to spread mainly from person-to-person. ? Between people who are in close contact with one another (within about 6 feet). ? Through respiratory droplets produced when an infected person coughs, sneezes or talks. ? These droplets can land in the mouths or noses of people who are nearby or possibly be inhaled into the lungs. ? Some recent studies have suggested that COVID-19 may be spread by people who are not showing symptoms. Everyone should Clean your hands often  Wash your hands often with soap and water for at least 20 seconds especially after you have been in a public place, or after blowing your nose, coughing, or sneezing.  If soap and water are not readily available, use a hand sanitizer that contains at least 60% alcohol. Cover all surfaces of your hands and rub them together until they feel dry.  Avoid touching your eyes, nose, and mouth with unwashed hands. Avoid close contact  Stay home if you are sick.  Avoid close contact with people who are sick.  Put distance between yourself and other people. ? Remember that some people without symptoms may be able to spread virus. ? This is especially important for people who are at higher risk of getting very sick.www.cdc.gov/coronavirus/2019-ncov/need-extra-precautions/people-at-higher-risk.html Cover your mouth and nose with a cloth face cover when around others  You could spread COVID-19 to others even if you do not feel sick.  Everyone should wear a cloth face cover when they have to go out in public, for example to the grocery store or to pick up other necessities. ? Cloth face coverings should not be placed on young children under age 2, anyone who has trouble breathing, or is unconscious,  incapacitated or otherwise unable to remove the mask without assistance.  The cloth face cover is meant to protect other people in case you are infected.  Do NOT use a facemask meant for a healthcare worker.  Continue to keep about 6 feet between yourself and others. The cloth face cover is not a substitute for social distancing. Cover coughs and sneezes  If you are in a private setting and do not have on your cloth face covering, remember to always cover your mouth and nose with a tissue when you cough or sneeze or use the inside of your elbow.  Throw used tissues in the trash.  Immediately wash your hands with soap and water for at least 20 seconds. If soap and water are not readily available, clean your hands with a hand sanitizer that contains at least 60% alcohol. Clean and disinfect  Clean AND disinfect frequently touched surfaces daily. This includes tables, doorknobs, light switches, countertops, handles, desks, phones, keyboards, toilets, faucets, and sinks. www.cdc.gov/coronavirus/2019-ncov/prevent-getting-sick/disinfecting-your-home.html  If surfaces are dirty, clean them: Use detergent or soap and water prior to disinfection.  Then, use a household disinfectant. You can see a list of EPA-registered household disinfectants here. cdc.gov/coronavirus 12/19/2018 This information is not intended to replace advice given to you by your health care provider. Make sure you discuss any questions you have with your health care provider. Document Released: 11/28/2018 Document Revised: 12/27/2018 Document Reviewed: 11/28/2018 Elsevier Patient Education  2020 [employer].  

## 2019-04-24 NOTE — Progress Notes (Signed)
Pt presents with urostomy at baseline, reports darkened urine and fatigue.  Urine in bag appears to be dark yellow.  Denies abd/pelvic/back pain.  Denies fever/chills or N/V/D.  Denies any other symptoms of UTI or changes in respiratory status.  Afebrile.  A&Ox4.

## 2019-04-25 NOTE — Progress Notes (Signed)
[facility] Progress Note   [name] 1940/08/25 78 y.o.  [name] is managed by Dr. Alen Blew  Actively treated with chemotherapy/immunotherapy/hormonal therapy: [name] is currently managed with active surveillance  Next scheduled appointment with provider: 05/24/2019  Assessment: Plan:    Urinary tract infection with hematuria, site unspecified - Plan: sulfamethoxazole-trimethoprim (BACTRIM DS) 800-160 MG tablet  Malignant neoplasm of urinary bladder, unspecified site ([name])  Bone metastasis (HCC)   Urinary tract infection with a history of urosepsis: No urinalysis or urine culture was collected today due to the likelihood of contamination from the patient's urostomy bag.  He was treated empirically with Bactrim DS p.o. twice daily x7 days.  He knows to call or return if he is not showing benefits from this therapy.  Metastatic bladder cancer with bone metastasis: Mr. [name] continues to be followed by Dr. Alen Blew and is currently being managed with surveillance.  He is scheduled to return for follow-up on 05/24/2019.  Please see After Visit Summary for patient specific instructions.  Future Appointments  Date Time Provider [place]  04/27/2019  9:30 AM Valentina Shaggy, MD [facility] None  04/30/2019  2:10 PM Ladene Artist, MD [facility]  05/22/2019  8:30 AM [facility] 2 [facility] None  05/22/2019  9:30 AM [facility] [place]  05/24/2019  8:30 AM [name], MD [facility] None    No orders of the defined types were placed in this encounter.      Subjective:   Patient ID:  [name] is a 78 y.o. (DOB 09-Jan-1941) male.  Chief Complaint:  Chief Complaint  Patient presents with  . Urinary Tract Infection    HPI [name]   is a 78 year old male with a history of a metastatic bladder cancer with bone metastasis.  He continues to be managed by Dr. Alen Blew and is currently managed with  active surveillance.  He has a urostomy and has recurrent urinary tract infections.  He also has had hospitalizations for urosepsis.  He presents to the clinic today with a report of fatigue.  He has had urosepsis before without fevers with fatigue being his only presenting symptom.  He reports having progressive fatigue over the last several days with dark yellow and cloudy urine output.  Medications: I have reviewed the patient's current medications.  Allergies:  Allergies  Allergen Reactions  . Ciprofloxacin Hives  . Cefepime Rash    Past Medical History:  Diagnosis Date  . Bladder cancer ([place])   . BPH (benign prostatic hypertrophy)   . Dysuria   . GERD (gastroesophageal reflux disease)   . History of adenomatous polyp of colon    tubular adenoma 06/ 2018  . History of bladder cancer 12/2014   urologist-  dr [name]--  dx High Grade TCC without stromal invasion s/p TURBT and intravesical BCG tx/  recurrent 07/2015 (pTa) TCC  repear intravesical BCG tx   . History of hiatal hernia   . History of urethral stricture   . Nocturia     Past Surgical History:  Procedure Laterality Date  . CARDIAC CATHETERIZATION  1997   normal coronary arteries  . CARDIOVASCULAR STRESS TEST  12-14-2005   Low risk perfusion study/  very small scar in the inferior septum from mid ventricle to apex,  no ischemia/  mild inferior septal hypokinesis/  ef 58%  . CATARACT EXTRACTION W/ INTRAOCULAR LENS  IMPLANT, BILATERAL  2011  . COLONOSCOPY  last  one 06/ 2018  . CYSTOSCOPY WITH BIOPSY N/A 08/12/2015   Procedure: CYSTOSCOPY WITH BIOPSY AND FULGERATION;  Surgeon: [name], MD;  Location: [facility];  Service: Urology;  Laterality: N/A;  . CYSTOSCOPY WITH INJECTION N/A 06/29/2017   Procedure: CYSTOSCOPY WITH INJECTION OF INDOCYANINE GREEN DYE;  Surgeon: Alexis Frock, MD;  Location[facility];  Service: Urology;  Laterality: N/A;  . CYSTOSCOPY WITH URETHRAL DILATATION N/A 03/21/2017    Procedure: CYSTOSCOPY;  Surgeon: [name], MD;  Location: [facility];  Service: Urology;  Laterality: N/A;  . ESOPHAGOGASTRODUODENOSCOPY  12-05-2014  . IR IMAGING GUIDED PORT INSERTION  02/07/2018  . IR REMOVAL TUN ACCESS W/ PORT W/O FL MOD SED  04/14/2018  . TRANSTHORACIC ECHOCARDIOGRAM  01-31-2008   nomral LVF,  ef 55-65%/  mild pulmonic stenosis without regurg.,  peak transpulomonic valve gradient 33mmHg  . TRANSURETHRAL RESECTION OF BLADDER TUMOR N/A 12/31/2014   Procedure: TRANSURETHRAL RESECTION OF BLADDER TUMOR (TURBT);  Surgeon: Lowella Bandy, MD;  Location: [facility];  Service: Urology;  Laterality: N/A;  . TRANSURETHRAL RESECTION OF BLADDER TUMOR N/A 03/21/2017   Procedure: POSSIBLE TRANSURETHRAL RESECTION OF BLADDER TUMOR (TURBT);  Surgeon: [name], MD;  Location: [facility];  Service: Urology;  Laterality: N/A;    Family History  Problem Relation Age of Onset  . Alcoholism Father   . Colon cancer Neg Hx   . Colon polyps Neg Hx   . Diabetes Neg Hx   . Kidney disease Neg Hx   . Esophageal cancer Neg Hx   . Heart disease Neg Hx   . Gallbladder disease Neg Hx     Social History   Socioeconomic History  . Marital status: Married    Spouse name: Not on file  . Number of children: 1  . Years of education: Not on file  . Highest education level: Not on file  Occupational History  . Occupation: Retired  Scientific laboratory technician  . Financial resource strain: Not on file  . Food insecurity    Worry: Not on file    Inability: Not on file  . Transportation needs    Medical: Not on file    Non-medical: Not on file  Tobacco Use  . Smoking status: Former Smoker    Packs/day: 1.00    Years: 34.00    Pack years: 34.00    Types: Cigarettes    Quit date: 12/26/1988    Years since quitting: 30.3  . Smokeless tobacco: Never Used  Substance and Sexual Activity  . Alcohol use: Yes    Alcohol/week: 6.0 standard drinks     Types: 6 Cans of beer per week    Comment: BEER  . Drug use: No  . Sexual activity: Never  Lifestyle  . Physical activity    Days per week: Not on file    Minutes per session: Not on file  . Stress: Not on file  Relationships  . Social Herbalist on phone: Not on file    Gets together: Not on file    Attends religious service: Not on file    Active member of club or organization: Not on file    Attends meetings of clubs or organizations: Not on file    Relationship status: Not on file  . Intimate partner violence    Fear of current or ex partner: Not on file    Emotionally abused: Not on file  Physically abused: Not on file    Forced sexual activity: Not on file  Other Topics Concern  . Not on file  Social History Narrative  . Not on file    Past Medical History, Surgical history, Social history, and Family history were reviewed and updated as appropriate.   Please see review of systems for further details on the patient's review from today.   Review of Systems:  Review of Systems  Constitutional: Positive for fatigue. Negative for chills, diaphoresis and fever.  Respiratory: Negative for cough and shortness of breath.   Cardiovascular: Negative for chest pain, palpitations and leg swelling.  Gastrointestinal: Negative for abdominal pain, constipation, diarrhea, nausea and vomiting.  Genitourinary: Negative for dysuria, flank pain, frequency, hematuria and urgency.       Dark and cloudy urine output to the urostomy bag  Skin: Negative for rash.  Neurological: Negative for dizziness and headaches.    Objective:   Physical Exam:  BP 140/80 (BP Location: Left Arm, Patient Position: Sitting)   Pulse 85   Temp 99.1 F (37.3 C) (Temporal)   Resp 17   Ht 5\' 11"  (1.803 m)   Wt 202 lb 4.8 oz (91.8 kg)   SpO2 97%   BMI 28.22 kg/m  ECOG: 1  Physical Exam Constitutional:      General: He is not in acute distress.    Appearance: He is not diaphoretic.   HENT:     Head: Normocephalic and atraumatic.  Cardiovascular:     Rate and Rhythm: Normal rate and regular rhythm.     Heart sounds: Normal heart sounds. No murmur. No friction rub. No gallop.   Pulmonary:     Effort: Pulmonary effort is normal. No respiratory distress.     Breath sounds: Normal breath sounds. No wheezing or rales.  Abdominal:     General: There is no distension.     Tenderness: There is no abdominal tenderness. There is no guarding.     Comments: There is a urostomy in the right upper quadrant with cloudy yellow urine noted in the patient's urostomy bag.  Skin:    General: Skin is warm and dry.     Findings: No erythema or rash.  Neurological:     Mental Status: He is alert.     Gait: Gait normal.  Psychiatric:        Mood and Affect: Mood normal.        Behavior: Behavior normal.        Thought Content: Thought content normal.        Judgment: Judgment normal.     Lab Review:     Component Value Date/Time   NA 135 03/09/2019 1124   NA 141 05/23/2017 0828   K 4.0 03/09/2019 1124   K 4.0 05/23/2017 0828   CL 99 03/09/2019 1124   CO2 27 03/09/2019 1124   CO2 27 05/23/2017 0828   GLUCOSE 117 (H) 03/09/2019 1124   GLUCOSE 97 05/23/2017 0828   BUN 23 03/09/2019 1124   BUN 12.5 05/23/2017 0828   CREATININE 1.01 03/09/2019 1124   CREATININE 1.04 03/01/2019 0943   CREATININE 0.8 05/23/2017 0828   CALCIUM 8.4 (L) 03/09/2019 1124   CALCIUM 9.0 05/23/2017 0828   PROT 6.1 (L) 03/09/2019 1124   PROT 5.6 (L) 05/23/2017 0828   ALBUMIN 2.7 (L) 03/09/2019 1124   ALBUMIN 2.5 (L) 05/23/2017 0828   AST 42 (H) 03/09/2019 1124   AST 22 03/01/2019 0943   AST 13  05/23/2017 0828   ALT 33 03/09/2019 1124   ALT 35 03/01/2019 0943   ALT 15 05/23/2017 0828   ALKPHOS 46 03/09/2019 1124   ALKPHOS 57 05/23/2017 0828   BILITOT 0.9 03/09/2019 1124   BILITOT 0.5 03/01/2019 0943   BILITOT 0.37 05/23/2017 0828   GFRNONAA >60 03/09/2019 1124   GFRNONAA >60 03/01/2019 0943    GFRAA >60 03/09/2019 1124   GFRAA >60 03/01/2019 0943       Component Value Date/Time   WBC 13.6 (H) 03/09/2019 1116   RBC 3.69 (L) 03/09/2019 1116   HGB 13.2 03/09/2019 1116   HGB 14.9 03/01/2019 0943   HGB 12.7 (L) 05/23/2017 0828   HCT 39.9 03/09/2019 1116   HCT 37.3 (L) 05/23/2017 0828   PLT 148 (L) 03/09/2019 1116   PLT 274 03/01/2019 0943   PLT 298 05/23/2017 0828   MCV 108.1 (H) 03/09/2019 1116   MCV 104.2 (H) 05/23/2017 0828   MCH 35.8 (H) 03/09/2019 1116   MCHC 33.1 03/09/2019 1116   RDW 13.2 03/09/2019 1116   RDW 13.6 05/23/2017 0828   LYMPHSABS 0.5 (L) 03/09/2019 1116   LYMPHSABS 0.8 (L) 05/23/2017 0828   MONOABS 1.9 (H) 03/09/2019 1116   MONOABS 1.0 (H) 05/23/2017 0828   EOSABS 0.0 03/09/2019 1116   EOSABS 0.1 05/23/2017 0828   BASOSABS 0.0 03/09/2019 1116   BASOSABS 0.0 05/23/2017 0828   -------------------------------  Imaging from last 24 hours (if applicable):  Radiology interpretation: No results found.

## 2019-04-27 ENCOUNTER — Ambulatory Visit (INDEPENDENT_AMBULATORY_CARE_PROVIDER_SITE_OTHER): Payer: Medicare Other | Admitting: Allergy & Immunology

## 2019-04-27 ENCOUNTER — Encounter: Payer: Self-pay | Admitting: Allergy & Immunology

## 2019-04-27 ENCOUNTER — Other Ambulatory Visit: Payer: Self-pay

## 2019-04-27 VITALS — BP 128/80 | HR 84 | Temp 97.9°F | Resp 20 | Ht 71.0 in | Wt 202.2 lb

## 2019-04-27 DIAGNOSIS — L299 Pruritus, unspecified: Secondary | ICD-10-CM

## 2019-04-27 DIAGNOSIS — L508 Other urticaria: Secondary | ICD-10-CM

## 2019-04-27 MED ORDER — METHYLPREDNISOLONE ACETATE 80 MG/ML IJ SUSP
80.0000 mg | Freq: Once | INTRAMUSCULAR | Status: AC
  Administered 2019-04-27: 80 mg via INTRAMUSCULAR

## 2019-04-27 NOTE — Patient Instructions (Addendum)
1. Itching and chronic urticaria (hives) - Your history does not have any "red flags" such as fevers, joint pains, or permanent skin changes that would be concerning for a more serious cause of hives.  - We will get some labs to rule out serious causes of hives: alpha gal panel, basic food panel, environmental allergy panel, complete blood count, tryptase level, chronic urticaria panel, CMP, ESR, and CRP. - Chronic hives are often times a self limited process and will "burn themselves out" over 6-12 months, although this is not always the case.  - Start the prednisone pack provided to help with immediate improvement in the itching. - In the meantime, start suppressive dosing of antihistamines:   - Morning: Zyrtec (cetirizine) 1m (one tablets) + Pepcid (famotidine) 253m - Evening: Zyrtec (cetirizine) 1031mone tablets) + Pepcid (famotidine) 13m85mYou can change this dosing at home, decreasing the dose as needed or increasing the dosing as needed.  - If you are not tolerating the medications or are tired of taking them every day, we can start treatment with a monthly injectable medication called Xolair.   2. Return in about 3 months (around 07/27/2019). This can be an in-person, a virtual Webex or a telephone follow up visit.   Please inform us [place] Emergency Department visits, hospitalizations, or changes in symptoms. Call us b[place]ore going to the ED for breathing or allergy symptoms since we might be able to fit you in for a sick visit. Feel free to contact us a[place]atime with any questions, problems, or concerns.  It was a pleasure to meet you today!  Websites that have reliable patient information: 1. American Academy of Asthma, Allergy, and Immunology: www.aaaai.org 2. Food Allergy Research and Education (FARE): foodallergy.org 3. Mothers of Asthmatics: http://www.asthmacommunitynetwork.org 4. American College of Allergy, Asthma, and Immunology: www.acaai.org  "Like" us o[place]Facebook and  Instagram for our latest updates!      Make sure you are registered to vote! If you have moved or changed any of your contact information, you will need to get this updated before voting!  In some cases, you MAY be able to register to vote online: [email or web address] ID laws are NOT going into effect for the General Election in November 2020! DO NOT let this stop you from exercising your right to vote!   Absentee voting is the SAFEST way to vote during the coronavirus pandemic!   Download and print an absentee ballot request form at [email or web address] or you can scan the QR code below with your smart phone:      More information on absentee ballots can be found here: [email or web address]

## 2019-04-27 NOTE — Progress Notes (Addendum)
NEW PATIENT  Date of Service/Encounter:  04/27/19  Referring provider: [name], MD   Assessment:   Chronic urticaria with itching - unknown trigger, but possibly related to his opioids for pain control  Metastatic bladder cancer - s/p treatment with gemcitabine and cisplatin, and pembrolizumab  Pain control with opioids  Plan/Recommendations:   1. Itching and chronic urticaria (hives) - Your history does not have any "red flags" such as fevers, joint pains, or permanent skin changes that would be concerning for a more serious cause of hives.  - We will get some labs to rule out serious causes of hives: alpha gal panel, basic food panel, environmental allergy panel, complete blood count, tryptase level, chronic urticaria panel, CMP, ESR, and CRP. - Chronic hives are often times a self limited process and will "burn themselves out" over 6-12 months, although this is not always the case.  - DepoMedrol 92m IM given in clinic today.  - Start the prednisone pack provided to help with immediate improvement in the itching.  - In the meantime, start suppressive dosing of antihistamines:   - Morning: Zyrtec (cetirizine) 164m(one tablets) + Pepcid (famotidine) 2029m- Evening: Zyrtec (cetirizine) 46m79mne tablets) + Pepcid (famotidine) 20mg51mou can change this dosing at home, decreasing the dose as needed or increasing the dosing as needed.  - If you are not tolerating the medications or are tired of taking them every day, we can start treatment with a monthly injectable medication called Xolair.   2. Return in about 3 months (around 07/27/2019). This can be an in-person, a virtual Webex or a telephone follow up visit.   Subjective:   [name] 78 y.40 male presenting today for evaluation of  Chief Complaint  Patient presents with  . Pruritus  . Rash    Lance MADOLEa history of the following: Patient Active Problem List   Diagnosis Date Noted  .  Redness and swelling of thigh 06/05/2018  . Adverse drug reaction 06/05/2018  . Bacteremia associated with intravascular line (HCC) [place]05/2019  . Febrile neutropenia (HCC) [place]29/2019  . Gram positive sepsis (HCC) [place]29/2019  . SIRS (systemic inflammatory response syndrome) (HCC) [place]28/2019  . Bacteremia due to Gram-positive bacteria 04/12/2018  . Pancytopenia (HCC) [place]28/2019  . Status post chemotherapy   . UTI (urinary tract infection) 04/11/2018  . Port-A-Cath in place 02/20/2018  . Encounter for antineoplastic chemotherapy 02/17/2018  . Sepsis due to urinary tract infection (HCC) [place]12/2019  . Sepsis secondary to UTI (HCC) [place]12/2019  . Hyperglycemia   . Goals of care, counseling/discussion 01/18/2018  . Bone metastasis (HCC) [place]16/2019  . Status post ileal conduit (HCC) [place]14/2018  . Bladder cancer (HCC) [place]13/2018  . Malignant neoplasm of urinary bladder (HCC) [place]05/2018  . COPD UNSPECIFIED 03/07/2008    History obtained from: chart review and patient.  [name] by [employer]     [name] 78 y.77 male presenting for an evaluation of pruritis and rash. Symptoms have been ongoing for over one year, although the time course is difficult to get pinned down with the patient.  He has a history of stage IV metastatic bladder cancer (bone and lung involvement). He is managed by Dr. ShadaAlen BlewPA [facility] last visit with them was in September 2020.  He is currently undergoing management with active surveillance.  He is having another scan in a month or  so to see if the cancer is progressing.  He has a urostomy in place in conjunction with the urostomy, he has a history of recurrent UTIs and urosepsis.   His cancer was treated with gemcitabine and cisplatin which was started in September 2018.  He completed radiation to the pelvis in June 2019.  He was on pembrolizumab 200 mg every 3 weeks, starting in November 2019.  He  completed 8 cycles of this and stopped in April 2020 due to dermatologic toxicities, per his Ocology note from July 2020. He is now off of all medications since April 2019 and they are scanning him again in one month to see if everything has remained stable.   He is here today for an evaluation of chronic pruritus and a rash He breaks out "all of the time". It has been going on for 18 months or so. He will take a prednisone pack and it resolves. He has had steroids 5-6 times. He tried stopping his medications and he changed soaps and washing powder. Nothing has seemed to have worked. He has had no systemic symptoms with this rash including SOB, throat swelling, or abdominal pain.   He has tried Benadryl and doubled up on it, but it has not helped at all.  It is unclear whether he has tried any other antihistamines.  He has used various ointments and lotions without much success.  He denies any association with any foods.  He denies any association with any particular environmental trigger.  Of note, he does have a fentanyl patch, Dilaudid tablet, and oxycodone tablet for pain control.  Its unclear how often he is using these.  He has no atopic history at all.  He denies any allergic rhinitis, asthma, or eczema history.  Otherwise, there is no history of other atopic diseases, including asthma, food allergies, drug allergies, environmental allergies, stinging insect allergies or contact dermatitis. There is no significant infectious history. Vaccinations are up to date.    Past Medical History: Patient Active Problem List   Diagnosis Date Noted  . Redness and swelling of thigh 06/05/2018  . Adverse drug reaction 06/05/2018  . Bacteremia associated with intravascular line ([place]) 04/20/2018  . Febrile neutropenia ([place]) 04/13/2018  . Gram positive sepsis ([place]) 04/13/2018  . SIRS (systemic inflammatory response syndrome) ([place]) 04/12/2018  . Bacteremia due to Gram-positive bacteria 04/12/2018  .  Pancytopenia ([place]) 04/12/2018  . Status post chemotherapy   . UTI (urinary tract infection) 04/11/2018  . Port-A-Cath in place 02/20/2018  . Encounter for antineoplastic chemotherapy 02/17/2018  . Sepsis due to urinary tract infection ([place]) 01/25/2018  . Sepsis secondary to UTI ([place]) 01/25/2018  . Hyperglycemia   . Goals of care, counseling/discussion 01/18/2018  . Bone metastasis ([place]) 12/29/2017  . Status post ileal conduit ([place]) 06/29/2017  . Bladder cancer ([place]) 06/28/2017  . Malignant neoplasm of urinary bladder ([place]) 04/20/2017  . COPD UNSPECIFIED 03/07/2008    Medication List:  Allergies as of 04/27/2019      Reactions   Ciprofloxacin Hives   Cefepime Rash      Medication List       Accurate as of April 27, 2019  2:13 PM. If you have any questions, ask your nurse or doctor.        STOP taking these medications   predniSONE 10 MG (21) Tbpk tablet Commonly known as: STERAPRED UNI-PAK 21 TAB Stopped by: Valentina Shaggy, MD     TAKE these medications   acetaminophen 500 MG  tablet Commonly known as: TYLENOL Take 1,000 mg by mouth every 6 (six) hours as needed for fever.   bisacodyl 5 MG EC tablet Commonly known as: DULCOLAX Take 5 mg by mouth daily as needed for moderate constipation.   camphor-menthol lotion Commonly known as: Sarna Apply 1 application topically as needed for itching.   diphenhydrAMINE 25 MG tablet Commonly known as: BENADRYL Take 25 mg by mouth every 6 (six) hours as needed for itching.   lactose free nutrition Liqd Take 237 mLs by mouth 3 (three) times daily between meals.   feeding supplement (ENSURE ENLIVE) Liqd Take 237 mLs by mouth 2 (two) times daily between meals.   fentaNYL 50 MCG/HR Commonly known as: [place] 1 patch onto the skin every 3 (three) days.   HYDROmorphone 4 MG tablet Commonly known as: Dilaudid Take 1 tablet (4 mg total) by mouth every 4 (four) hours as needed for severe pain.   oxyCODONE 5  MG immediate release tablet Commonly known as: Oxy IR/ROXICODONE Take 1-2 tablets (5-10 mg total) by mouth every 4 (four) hours as needed for severe pain.   prochlorperazine 10 MG tablet Commonly known as: COMPAZINE Take 1 tablet (10 mg total) by mouth every 6 (six) hours as needed for nausea or vomiting.   sulfamethoxazole-trimethoprim 800-160 MG tablet Commonly known as: BACTRIM DS Take 1 tablet by mouth 2 (two) times daily.       Birth History: non-contributory  Developmental History: non-contributory  Past Surgical History: Past Surgical History:  Procedure Laterality Date  . CARDIAC CATHETERIZATION  1997   normal coronary arteries  . CARDIOVASCULAR STRESS TEST  12-14-2005   Low risk perfusion study/  very small scar in the inferior septum from mid ventricle to apex,  no ischemia/  mild inferior septal hypokinesis/  ef 58%  . CATARACT EXTRACTION W/ INTRAOCULAR LENS  IMPLANT, BILATERAL  2011  . COLONOSCOPY  last one 06/ 2018  . CYSTOSCOPY WITH BIOPSY N/A 08/12/2015   Procedure: CYSTOSCOPY WITH BIOPSY AND FULGERATION;  Surgeon: [name], MD;  Location[facility];  Service: Urology;  Laterality: N/A;  . CYSTOSCOPY WITH INJECTION N/A 06/29/2017   Procedure: CYSTOSCOPY WITH INJECTION OF INDOCYANINE GREEN DYE;  Surgeon: Alexis Frock, MD;  Location: [facility];  Service: Urology;  Laterality: N/A;  . CYSTOSCOPY WITH URETHRAL DILATATION N/A 03/21/2017   Procedure: CYSTOSCOPY;  Surgeon: [name], MD;  [facility];  Service: Urology;  Laterality: N/A;  . ESOPHAGOGASTRODUODENOSCOPY  12-05-2014  . IR IMAGING GUIDED PORT INSERTION  02/07/2018  . IR REMOVAL TUN ACCESS W/ PORT W/O FL MOD SED  04/14/2018  . TRANSTHORACIC ECHOCARDIOGRAM  01-31-2008   nomral LVF,  ef 55-65%/  mild pulmonic stenosis without regurg.,  peak transpulomonic valve gradient 7mHg  . TRANSURETHRAL RESECTION OF BLADDER TUMOR N/A 12/31/2014   Procedure:  TRANSURETHRAL RESECTION OF BLADDER TUMOR (TURBT);  Surgeon: MLowella Bandy MD;  [facility]  Service: Urology;  Laterality: N/A;  . TRANSURETHRAL RESECTION OF BLADDER TUMOR N/A 03/21/2017   Procedure: POSSIBLE TRANSURETHRAL RESECTION OF BLADDER TUMOR (TURBT);  Surgeon: BNickie Retort MD;  [facility]  Service: Urology;  Laterality: N/A;     Family History: Family History  Problem Relation Age of Onset  . Alcoholism Father   . Colon cancer Neg Hx   . Colon polyps Neg Hx   . Diabetes Neg Hx   . Kidney disease Neg Hx   .  Esophageal cancer Neg Hx   . Heart disease Neg Hx   . Gallbladder disease Neg Hx      Social History: [name] lives at home with his wife.  They live in a house.  There is no mildew damage in the home.  They have electric heating and central cooling.  There are no animals inside or outside of the home.  He does have dust mite covers on his bedding.  There is no tobacco exposure now, but he did smoke for 30 to 40 years.  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   Review of Systems  Constitutional: Negative.  Negative for fever, malaise/fatigue and weight loss.  HENT: Negative.  Negative for congestion, ear discharge and ear pain.   Eyes: Negative for pain, discharge and redness.  Respiratory: Negative for cough, sputum production, shortness of breath and wheezing.   Cardiovascular: Negative.  Negative for chest pain and palpitations.  Gastrointestinal: Negative for abdominal pain, constipation, diarrhea, heartburn, nausea and vomiting.  Skin: Positive for itching and rash.  Neurological: Negative for dizziness and headaches.  Endo/Heme/Allergies: Negative for environmental allergies. Does not bruise/bleed easily.       Objective:   Blood pressure 128/80, pulse 84, temperature 97.9 F (36.6 C), temperature source Temporal, resp. rate 20, height '5\' 11"'  (1.803 m), weight 202 lb 3.2 oz (91.7 kg),  SpO2 95 %. Body mass index is 28.2 kg/m.   Physical Exam:   Physical Exam  Constitutional: He appears well-developed.  Somewhat crotchety but talkative.  HENT:  Head: Normocephalic and atraumatic.  Right Ear: Tympanic membrane, external ear and ear canal normal. No drainage, swelling or tenderness. Tympanic membrane is not injected, not scarred, not erythematous, not retracted and not bulging.  Left Ear: Tympanic membrane, external ear and ear canal normal. No drainage, swelling or tenderness. Tympanic membrane is not injected, not scarred, not erythematous, not retracted and not bulging.  Nose: Mucosal edema and rhinorrhea present. No nasal deformity or septal deviation. No epistaxis. Right sinus exhibits no maxillary sinus tenderness and no frontal sinus tenderness. Left sinus exhibits no maxillary sinus tenderness and no frontal sinus tenderness.  Mouth/Throat: Uvula is midline and oropharynx is clear and moist. Mucous membranes are not pale and not dry.  Eyes: Pupils are equal, round, and reactive to light. Conjunctivae and EOM are normal. Right eye exhibits no chemosis and no discharge. Left eye exhibits no chemosis and no discharge. Right conjunctiva is not injected. Left conjunctiva is not injected.  Cardiovascular: Normal rate, regular rhythm and normal heart sounds.  Respiratory: Effort normal and breath sounds normal. No accessory muscle usage. No tachypnea. No respiratory distress. He has no wheezes. He has no rhonchi. He has no rales. He exhibits no tenderness.  Moving air well in all lung fields.  GI: There is no abdominal tenderness. There is no rebound and no guarding.  Lymphadenopathy:       Head (right side): No submandibular, no tonsillar and no occipital adenopathy present.       Head (left side): No submandibular, no tonsillar and no occipital adenopathy present.    He has no cervical adenopathy.  Neurological: He is alert.  Skin: No abrasion, no petechiae and no rash  noted. Rash is not papular, not vesicular and not urticarial. No erythema. No pallor.  He does have scattered erythematous blanchable macular lesions over his entire body.  Some are more raised, but the majority of them are flat.  There is no honey  crusting suggestive of a staphylococcal skin infection.  There is no much of his body that is spared.  There are excoriation marks present.  Psychiatric: He has a normal mood and affect.     Diagnostic studies: labs sent instead      Salvatore Marvel, MD [facility]

## 2019-04-30 ENCOUNTER — Other Ambulatory Visit: Payer: Self-pay

## 2019-04-30 ENCOUNTER — Ambulatory Visit (INDEPENDENT_AMBULATORY_CARE_PROVIDER_SITE_OTHER): Payer: Medicare Other | Admitting: Gastroenterology

## 2019-04-30 ENCOUNTER — Encounter: Payer: Self-pay | Admitting: Gastroenterology

## 2019-04-30 ENCOUNTER — Other Ambulatory Visit: Payer: Self-pay | Admitting: Allergy & Immunology

## 2019-04-30 VITALS — BP 128/70 | HR 88 | Temp 97.4°F | Ht 71.0 in | Wt 203.8 lb

## 2019-04-30 DIAGNOSIS — Z86010 Personal history of colonic polyps: Secondary | ICD-10-CM | POA: Diagnosis not present

## 2019-04-30 DIAGNOSIS — K5901 Slow transit constipation: Secondary | ICD-10-CM

## 2019-04-30 DIAGNOSIS — L508 Other urticaria: Secondary | ICD-10-CM | POA: Diagnosis not present

## 2019-04-30 MED ORDER — FAMOTIDINE 40 MG PO TABS: 40.0000 mg | ORAL_TABLET | Freq: Every day | ORAL | 11 refills | Status: DC

## 2019-04-30 NOTE — Progress Notes (Signed)
    History of Present Illness: This is a 78 year old male with a history of multiple recurrent adenomatous colon polyps.  He has been treated with chemotherapy for stage IV bladder cancer with bone and lung involvement diagnosed in 2019.  He underwent laparoscopic cystoprostatectomy and bilateral lymphadenectomy in November 2018.  He has a right abdomen ureterostomy.  He has intermittent difficulties with constipation.  Occasionally notes small amounts of bright red blood per rectum with straining and hard stools.  He relates a follow-up imaging study for his bladder cancer is scheduled in early November.  He has intermittent reflux symptoms which are currently under fairly good control on Pepcid AC.  Current Medications, Allergies, Past Medical History, Past Surgical History, Family History and Social History were reviewed in [employer] record.   Physical Exam: General: Well developed, well nourished, no acute distress Head: Normocephalic and atraumatic Eyes:  sclerae anicteric, EOMI Ears: Normal auditory acuity Mouth: No deformity or lesions Lungs: Clear throughout to auscultation Heart: Regular rate and rhythm; no murmurs, rubs or bruits Abdomen: Soft, non tender and non distended. Right sided ureterostomy. No masses, hepatosplenomegaly or hernias noted. Normal Bowel sounds Rectal: Deferred to colonoscopy Musculoskeletal: Symmetrical with no gross deformities  Pulses:  Normal pulses noted Extremities: No clubbing, cyanosis, edema or deformities noted Neurological: Alert oriented x 4, grossly nonfocal Psychological:  Alert and cooperative. Normal mood and affect   Assessment and Recommendations:  1. Personal history multiple adenomatous colon polyps.  He is due for a 2-year interval surveillance colonoscopy with an extended bowel preparation.  Due to his stage IV bladder cancer he would like to defer this exam until he has more information from his follow-up  imaging studies in early November.  If the results are favorable he would like to proceed with colonoscopy in December otherwise may defer colonoscopy.  2. Constipation.  Maintain high-fiber diet with adequate daily fluid intake.  Continue Dulcolax daily or as an alternative MiraLAX once or twice daily.  3.  Stage IV bladder cancer.  Follow-up with Dr. Alen Blew and Dr. Tresa Moore as planned.  4.  GERD.  Follow standard antireflux measures.  Discontinue Pepcid AC.  Begin famotidine 40 mg daily.

## 2019-04-30 NOTE — Patient Instructions (Signed)
We have sent the following medications to your pharmacy for you to pick up at your convenience: famotidine.   Continue your laxative.   Per your request your colonoscopy will be scheduled in 07/2019. We will send you a reminder in the mail when it gets closer to that time.  Thank you for choosing me and [facility].  [name] Dagoberto Ligas., MD., [facility] Regal

## 2019-05-02 IMAGING — XA IR FLUORO GUIDE CV LINE*L*
1 series · 1 of 1 positions shown · non-contrast
Comparison: none

CLINICAL DATA: Metastatic bladder carcinoma . Needs durable venous
access for planned chemotherapy regimen.
TECHNIQUE: The procedure, risks, benefits, and alternatives were explained to
the patient. Questions regarding the procedure were encouraged and
answered. The patient understands and consents to the procedure. As
antibiotic prophylaxis, cefazolin 2 g was ordered pre-procedure and
administered intravenously within one hour of incision. Patency of
the right IJ vein was confirmed with ultrasound with image
documentation. An appropriate skin site was determined. Skin site
was marked. Region was prepped using maximum barrier technique
including cap and mask, sterile gown, sterile gloves, large sterile
sheet, and Chlorhexidine as cutaneous antisepsis. The region was
infiltrated locally with 1% lidocaine. Under real-time ultrasound
guidance, the right IJ vein was accessed with a 21 gauge
micropuncture needle; the needle tip within the vein was confirmed
with ultrasound image documentation. Needle was exchanged over a 018
guidewire for transitional dilator which allowed passage of the
Benson wire into the IVC. Over this, the transitional dilator was
exchanged for a 5 French MPA catheter. A small incision was made on
the right anterior chest wall and a subcutaneous pocket fashioned.
The power-injectable port was positioned and its catheter tunneled
to the right IJ dermatotomy site. The MPA catheter was exchanged
over an Amplatz wire for a peel-away sheath, through which the port
catheter, which had been trimmed to the appropriate length, was
advanced and positioned under fluoroscopy with its tip at the
cavoatrial junction. Spot chest radiograph confirms good catheter
position and no pneumothorax. The pocket was closed with deep
interrupted and subcuticular continuous 3-0 Monocryl sutures. The
port was flushed per protocol. The incisions were covered with
Dermabond then covered with a sterile dressing.

COMPLICATIONS:
COMPLICATIONS
None immediate

[Series 300: line placements · 1 of 1 slices shown]
[im 1/1]
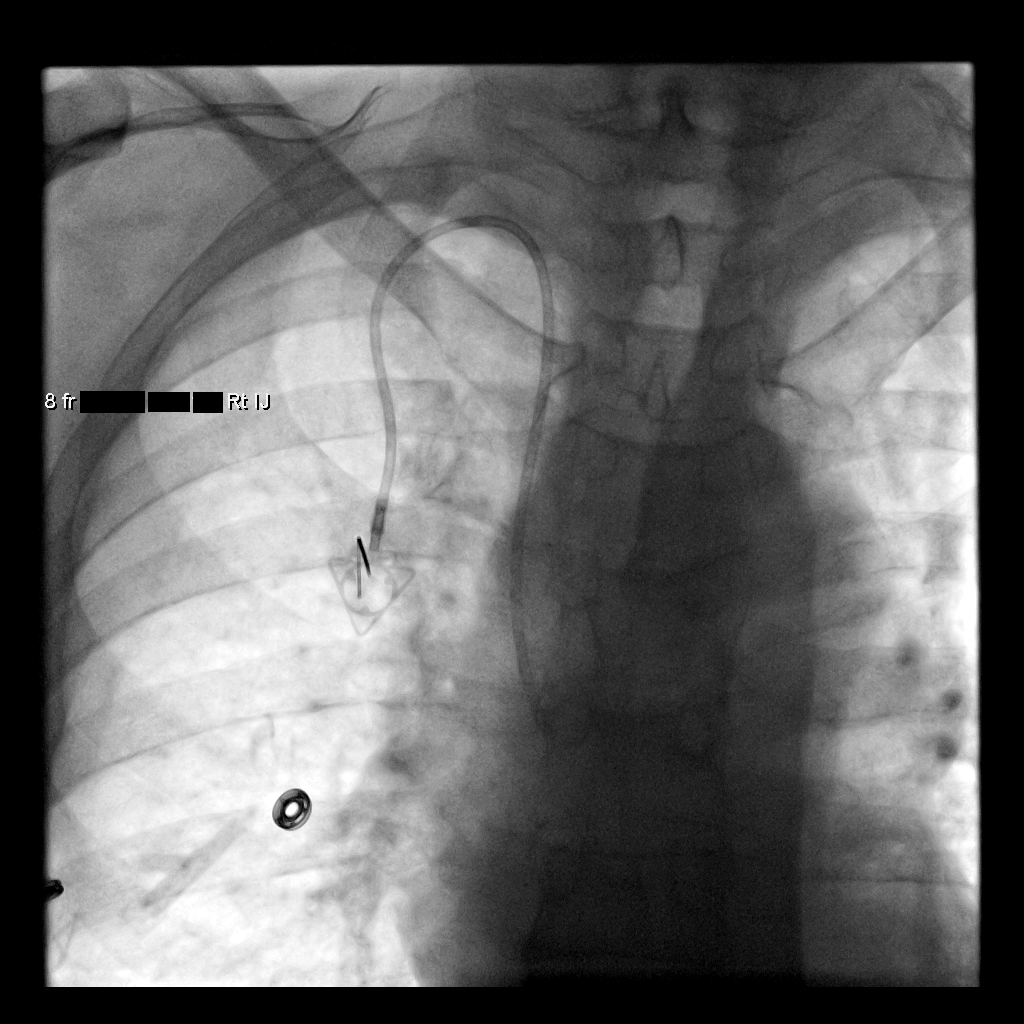

[1 of 1 positions shown; findings below may reference images not displayed]

EXAM:
TUNNELED PORT CATHETER PLACEMENT WITH ULTRASOUND AND FLUOROSCOPIC
GUIDANCE

FLUOROSCOPY TIME:  0.1 minute; 12 uSymM DAP

ANESTHESIA/SEDATION:
Intravenous Fentanyl and Versed were administered as conscious
sedation during continuous monitoring of the patient's level of
consciousness and physiological / cardiorespiratory status by the
radiology RN, with a total moderate sedation time of 19 minutes.
IMPRESSION: Technically successful right IJ power-injectable port catheter
placement. Ready for routine use.

## 2019-05-04 LAB — ALPHA-GAL PANEL
Alpha Gal IgE*: 0.17 kU/L — ABNORMAL HIGH (ref <0.10)
Beef (Bos spp) IgE: 0.12 kU/L (ref <0.35)
Class Interpretation: 0
Class Interpretation: 0
Lamb/Mutton (Ovis spp) IgE: <0.1 kU/L (ref <0.35)
Pork (Sus spp) IgE: <0.1 kU/L (ref <0.35)

## 2019-05-04 LAB — IGE+ALLERGENS ZONE 2(30)

## 2019-05-04 LAB — CMP14+EGFR
ALT: 26 IU/L (ref 0–44)
AST: 20 IU/L (ref 0–40)
Albumin/Globulin Ratio: 1.9 (ref 1.2–2.2)
Albumin: 3.7 g/dL (ref 3.7–4.7)
Alkaline Phosphatase: 80 IU/L (ref 39–117)
BUN/Creatinine Ratio: 13 (ref 10–24)
BUN: 16 mg/dL (ref 8–27)
Bilirubin Total: 0.4 mg/dL (ref 0.0–1.2)
CO2: 21 mmol/L (ref 20–29)
Calcium: 9.1 mg/dL (ref 8.6–10.2)
Chloride: 105 mmol/L (ref 96–106)
Creatinine, Ser: 1.22 mg/dL (ref 0.76–1.27)
GFR calc Af Amer: 65 mL/min/{1.73_m2} (ref >59)
GFR calc non Af Amer: 56 mL/min/{1.73_m2} — ABNORMAL LOW (ref >59)
Globulin, Total: 2 g/dL (ref 1.5–4.5)
Glucose: 86 mg/dL (ref 65–99)
Potassium: 4.6 mmol/L (ref 3.5–5.2)
Sodium: 140 mmol/L (ref 134–144)
Total Protein: 5.7 g/dL — ABNORMAL LOW (ref 6.0–8.5)

## 2019-05-04 LAB — ALLERGEN PROFILE, BASIC FOOD
Allergen Corn, IgE: <0.1 kU/L
Beef IgE: 0.13 kU/L — AB
Chocolate/Cacao IgE: <0.1 kU/L
Egg, Whole IgE: <0.1 kU/L
Food Mix (Seafoods) IgE: NEGATIVE
Milk IgE: <0.1 kU/L
Peanut IgE: <0.1 kU/L
Pork IgE: <0.1 kU/L
Soybean IgE: <0.1 kU/L
Wheat IgE: <0.1 kU/L

## 2019-05-04 LAB — ALLERGEN PROFILE, MOLD
Aureobasidi Pullulans IgE: <0.1 kU/L
Candida Albicans IgE: <0.1 kU/L
M009-IgE Fusarium proliferatum: <0.1 kU/L
M014-IgE Epicoccum purpur: <0.1 kU/L
Phoma Betae IgE: <0.1 kU/L
Setomelanomma Rostrat: <0.1 kU/L

## 2019-05-04 LAB — C-REACTIVE PROTEIN: CRP: 5 mg/L (ref 0–10)

## 2019-05-04 LAB — ANA W/REFLEX IF POSITIVE: Anti Nuclear Antibody (ANA): NEGATIVE

## 2019-05-04 LAB — TRYPTASE: Tryptase: 8.5 ug/L (ref 2.2–13.2)

## 2019-05-04 LAB — SEDIMENTATION RATE: Sed Rate: 23 mm/hr (ref 0–30)

## 2019-05-04 LAB — THYROID ANTIBODIES
Thyroglobulin Antibody: <1 IU/mL (ref 0.0–0.9)
Thyroperoxidase Ab SerPl-aCnc: <9 IU/mL (ref 0–34)

## 2019-05-04 LAB — RHEUMATOID FACTOR: Rheumatoid fact SerPl-aCnc: <10 IU/mL (ref 0.0–13.9)

## 2019-05-10 ENCOUNTER — Telehealth: Payer: Self-pay

## 2019-05-10 NOTE — Telephone Encounter (Signed)
Spoke with pt and he stated that the cetirizine and Pepcid did not help. Pt wants to know if he can have a zpak until he can possibly get started on shots which was stated in last ov notes.  - If you are not tolerating the medications or are tired of taking them every day, we can start treatment with a monthly injectable medication called Xolair.  Please advise Dr. Ernst Bowler

## 2019-05-10 NOTE — Telephone Encounter (Signed)
Patient is calling due to having another break out. Patient is requesting a Zpack. Patient states he will come in if he needs to.    Louisa  Please advise.

## 2019-05-11 MED ORDER — PREDNISONE 10 MG PO TABS: ORAL_TABLET | ORAL | 0 refills | Status: DC

## 2019-05-11 NOTE — Telephone Encounter (Signed)
Noted.   Joel Gallagher, MD [facility]  

## 2019-05-11 NOTE — Addendum Note (Signed)
Addended by: Katherina Right D on: 05/11/2019 09:01 AM   Modules accepted: Orders

## 2019-05-11 NOTE — Telephone Encounter (Addendum)
FYI:  Spoke with pt- he not eating any red meats.  He is aware prednisone was sent to pharmacy.  He is very interested in getting xolair injections.  Mailed out [employer] and consent forms.

## 2019-05-11 NOTE — Telephone Encounter (Signed)
I would make sure that he is avoiding all red meats since he had a slightly elevated alpha gal. His labs finally came back. The remainder of his labs were negative, including an environmental allergy panel.  Azithromycin is not going to do anything for his hives. I can send in prednisone instead: Take two tablets (20mg ) twice daily for three days, then one tablet (10mg ) twice daily for three days, then STOP.  We can send him information on Xolair if he wants to try that. But I would make sure we are taking red meat out of his diet first to give that time to work.    Salvatore Marvel, MD [facility] [place]

## 2019-05-16 DIAGNOSIS — I10 Essential (primary) hypertension: Secondary | ICD-10-CM | POA: Diagnosis not present

## 2019-05-16 DIAGNOSIS — E7849 Other hyperlipidemia: Secondary | ICD-10-CM | POA: Diagnosis not present

## 2019-05-16 DIAGNOSIS — N400 Enlarged prostate without lower urinary tract symptoms: Secondary | ICD-10-CM | POA: Diagnosis not present

## 2019-05-16 DIAGNOSIS — G894 Chronic pain syndrome: Secondary | ICD-10-CM | POA: Diagnosis not present

## 2019-05-22 ENCOUNTER — Other Ambulatory Visit: Payer: Self-pay

## 2019-05-22 ENCOUNTER — Inpatient Hospital Stay: Payer: Medicare Other | Attending: Oncology

## 2019-05-22 ENCOUNTER — Ambulatory Visit (HOSPITAL_COMMUNITY)
Admission: RE | Admit: 2019-05-22 | Discharge: 2019-05-22 | Disposition: A | Discharge status: Home / Self Care | Payer: Medicare Other | Source: Ambulatory Visit | Attending: Oncology | Admitting: Oncology

## 2019-05-22 DIAGNOSIS — Z23 Encounter for immunization: Secondary | ICD-10-CM | POA: Insufficient documentation

## 2019-05-22 DIAGNOSIS — C679 Malignant neoplasm of bladder, unspecified: Secondary | ICD-10-CM | POA: Diagnosis not present

## 2019-05-22 DIAGNOSIS — R918 Other nonspecific abnormal finding of lung field: Secondary | ICD-10-CM | POA: Diagnosis not present

## 2019-05-22 DIAGNOSIS — K5710 Diverticulosis of small intestine without perforation or abscess without bleeding: Secondary | ICD-10-CM | POA: Diagnosis not present

## 2019-05-22 LAB — CBC WITH DIFFERENTIAL (CANCER CENTER ONLY)
Abs Immature Granulocytes: 0.2 10*3/uL — ABNORMAL HIGH (ref 0.00–0.07)
Basophils Absolute: 0.1 10*3/uL (ref 0.0–0.1)
Basophils Relative: 0 %
Eosinophils Absolute: 0.1 10*3/uL (ref 0.0–0.5)
Eosinophils Relative: 1 %
HCT: 41.1 % (ref 39.0–52.0)
Hemoglobin: 13.7 g/dL (ref 13.0–17.0)
Immature Granulocytes: 2 %
Lymphocytes Relative: 9 %
Lymphs Abs: 1 10*3/uL (ref 0.7–4.0)
MCH: 35.7 pg — ABNORMAL HIGH (ref 26.0–34.0)
MCHC: 33.3 g/dL (ref 30.0–36.0)
MCV: 107 fL — ABNORMAL HIGH (ref 80.0–100.0)
Monocytes Absolute: 1 10*3/uL (ref 0.1–1.0)
Monocytes Relative: 9 %
Neutro Abs: 9 10*3/uL — ABNORMAL HIGH (ref 1.7–7.7)
Neutrophils Relative %: 79 %
Platelet Count: 190 10*3/uL (ref 150–400)
RBC: 3.84 MIL/uL — ABNORMAL LOW (ref 4.22–5.81)
RDW: 13.8 % (ref 11.5–15.5)
WBC Count: 11.3 10*3/uL — ABNORMAL HIGH (ref 4.0–10.5)
nRBC: 0 % (ref 0.0–0.2)

## 2019-05-22 LAB — CMP (CANCER CENTER ONLY)
ALT: 21 U/L (ref 0–44)
AST: 13 U/L — ABNORMAL LOW (ref 15–41)
Albumin: 2.7 g/dL — ABNORMAL LOW (ref 3.5–5.0)
Alkaline Phosphatase: 60 U/L (ref 38–126)
Anion gap: 9 (ref 5–15)
BUN: 14 mg/dL (ref 8–23)
CO2: 26 mmol/L (ref 22–32)
Calcium: 8.9 mg/dL (ref 8.9–10.3)
Chloride: 106 mmol/L (ref 98–111)
Creatinine: 0.78 mg/dL (ref 0.61–1.24)
GFR, Est AFR Am: >60 mL/min (ref >60)
GFR, Estimated: >60 mL/min (ref >60)
Glucose, Bld: 99 mg/dL (ref 70–99)
Potassium: 4 mmol/L (ref 3.5–5.1)
Sodium: 141 mmol/L (ref 135–145)
Total Bilirubin: 0.5 mg/dL (ref 0.3–1.2)
Total Protein: 5.6 g/dL — ABNORMAL LOW (ref 6.5–8.1)

## 2019-05-22 MED ORDER — SODIUM CHLORIDE (PF) 0.9 % IJ SOLN
INTRAMUSCULAR | Status: AC
  Filled 2019-05-22: qty 50

## 2019-05-22 MED ORDER — SODIUM CHLORIDE 0.9 % IV SOLN
INTRAVENOUS | Status: AC
  Filled 2019-05-22: qty 250

## 2019-05-22 MED ORDER — IOHEXOL 300 MG/ML  SOLN
100.0000 mL | Freq: Once | INTRAMUSCULAR | Status: AC | PRN
  Administered 2019-05-22: 100 mL via INTRAVENOUS

## 2019-05-24 ENCOUNTER — Other Ambulatory Visit: Payer: Self-pay

## 2019-05-24 ENCOUNTER — Inpatient Hospital Stay (HOSPITAL_BASED_OUTPATIENT_CLINIC_OR_DEPARTMENT_OTHER): Payer: Medicare Other | Admitting: Oncology

## 2019-05-24 VITALS — BP 156/84 | HR 89 | Temp 98.0°F | Resp 19 | Wt 215.6 lb

## 2019-05-24 DIAGNOSIS — C679 Malignant neoplasm of bladder, unspecified: Secondary | ICD-10-CM | POA: Diagnosis not present

## 2019-05-24 DIAGNOSIS — Z23 Encounter for immunization: Secondary | ICD-10-CM | POA: Diagnosis not present

## 2019-05-24 MED ORDER — HYDROMORPHONE HCL 4 MG PO TABS: 4.0000 mg | ORAL_TABLET | ORAL | 0 refills | Status: DC | PRN

## 2019-05-24 MED ORDER — INFLUENZA VAC A&B SA ADJ QUAD 0.5 ML IM PRSY
PREFILLED_SYRINGE | INTRAMUSCULAR | Status: AC
  Filled 2019-05-24: qty 0.5

## 2019-05-24 MED ORDER — OXYCODONE HCL 5 MG PO TABS: 5.0000 mg | ORAL_TABLET | ORAL | 0 refills | Status: DC | PRN

## 2019-05-24 MED ORDER — INFLUENZA VAC A&B SA ADJ QUAD 0.5 ML IM PRSY
0.5000 mL | PREFILLED_SYRINGE | Freq: Once | INTRAMUSCULAR | Status: AC
  Administered 2019-05-24: 0.5 mL via INTRAMUSCULAR

## 2019-05-24 NOTE — Progress Notes (Signed)
Hematology and Oncology Follow Up Visit  [name] [id number] June 29, 1941 78 y.o. 05/24/2019 8:23 AM [name], MDFusco, Purcell Nails, MD   Principle Diagnosis: 78 year old man with bladder cancer diagnosed in 2018.he subsequently developed stage IV disease with pelvic bone involvement as well as lung involvement in 2019. His tumor is PDL 1 positive  with CPS score over 90%.    Prior Therapy:   He is status post neoadjuvant chemotherapy utilizing gemcitabine and cisplatin started in September 2018.  He received day 1 of cycle 1 on 05/02/2017 and therapy discontinued after that.  He is status post laparoscopic Cystoprostatectomy and bilateral lymphadenectomy done by Dr. Tresa Moore on 06/29/2017. Final pathology showed a T3bN0 disease.  He has 13 lymph nodes sampled without any evidence of malignancy.  He completed radiation therapy to the pelvis on January 16, 2018 under the care of Dr. Tammi Klippel.   Carboplatin and gemcitabine he is status post 6 cycles of therapy completed on 05/22/2018.  Pembrolizumab 200 mg every 3 weeks started on 06/21/2018.  He completed 8 cycles of therapy and April 2020.  Therapy discontinued because of dermatological toxicities.   Current therapy: Active surveillance.  Interim History: [name] returns today for repeat evaluation.  Since last visit, he reports no major changes in his health.  He continues to have chronic hip pain which is manageable with the current pain medication.  His mobility has remained about the same with enjoying reasonable quality of life.  He is able to play golf periodically and ambulates without any falls or syncope.  He denies shortness of breath or difficulty breathing.  Continues to have issues with pruritus and currently under evaluation by dermatology.  He denied headaches, blurry vision, syncope or seizures.  Denies any fevers, chills or sweats.  Denied chest pain, palpitation, orthopnea or leg edema.  Denied cough, wheezing or hemoptysis.   Denied nausea, vomiting or abdominal pain.  Denies any constipation or diarrhea.  Denies any frequency urgency or hesitancy.  Denies any arthralgias or myalgias.  Denies any skin rashes or lesions.  Denies any bleeding or clotting tendency.  Denies any easy bruising.  Denies any hair or nail changes.  Denies any anxiety or depression.  Remaining review of system is negative.          .      Medications: Updated today on review. Current Outpatient Medications  Medication Sig Dispense Refill  . acetaminophen (TYLENOL) 500 MG tablet Take 1,000 mg by mouth every 6 (six) hours as needed for fever.     . diphenhydrAMINE (BENADRYL) 25 MG tablet Take 25 mg by mouth every 6 (six) hours as needed for itching.    . famotidine (PEPCID) 40 MG tablet Take 1 tablet (40 mg total) by mouth daily. 30 tablet 11  . HYDROmorphone (DILAUDID) 4 MG tablet Take 1 tablet (4 mg total) by mouth every 4 (four) hours as needed for severe pain. 120 tablet 0  . lactose free nutrition (BOOST) LIQD Take 237 mLs by mouth 3 (three) times daily between meals.    Marland Kitchen oxyCODONE (OXY IR/ROXICODONE) 5 MG immediate release tablet Take 1-2 tablets (5-10 mg total) by mouth every 4 (four) hours as needed for severe pain. 120 tablet 0  . predniSONE (DELTASONE) 10 MG tablet Take 2 tablets (20 mg) twice daily for three days, then 1 tablet (10 mg) twice daily for three days, then stop. 18 tablet 0  . sulfamethoxazole-trimethoprim (BACTRIM DS) 800-160 MG tablet Take 1 tablet by mouth 2 (  two) times daily. 14 tablet 0   No current facility-administered medications for this visit.      Allergies:  Allergies  Allergen Reactions  . Ciprofloxacin Hives  . Cefepime Rash    Past Medical History, Surgical history, Social history, and Family History without any changes on review.  Physical Exam: Blood pressure (!) 156/84, pulse 89, temperature 98 F (36.7 C), temperature source Tympanic, resp. rate 19, weight 215 lb 9 oz (97.8 kg),  SpO2 98 %.    ECOG: 1     General appearance: Alert, awake without any distress. Head: Atraumatic without abnormalities Oropharynx: Without any thrush or ulcers. Eyes: No scleral icterus. Lymph nodes: No lymphadenopathy noted in the cervical, supraclavicular, or axillary nodes Heart:regular rate and rhythm, without any murmurs or gallops.   Lung: Clear to auscultation without any rhonchi, wheezes or dullness to percussion. Abdomin: Soft, nontender without any shifting dullness or ascites. Musculoskeletal: No clubbing or cyanosis. Neurological: No motor or sensory deficits. Skin: No rashes or lesions. Psychiatric: Mood and affect appeared normal.                Lab Results: Lab Results  Component Value Date   WBC 11.3 (H) 05/22/2019   HGB 13.7 05/22/2019   HCT 41.1 05/22/2019   MCV 107.0 (H) 05/22/2019   PLT 190 05/22/2019     Chemistry      Component Value Date/Time   NA 141 05/22/2019 0828   NA 140 04/30/2019 1033   NA 141 05/23/2017 0828   K 4.0 05/22/2019 0828   K 4.0 05/23/2017 0828   CL 106 05/22/2019 0828   CO2 26 05/22/2019 0828   CO2 27 05/23/2017 0828   BUN 14 05/22/2019 0828   BUN 16 04/30/2019 1033   BUN 12.5 05/23/2017 0828   CREATININE 0.78 05/22/2019 0828   CREATININE 0.8 05/23/2017 0828      Component Value Date/Time   CALCIUM 8.9 05/22/2019 0828   CALCIUM 9.0 05/23/2017 0828   ALKPHOS 60 05/22/2019 0828   ALKPHOS 57 05/23/2017 0828   AST 13 (L) 05/22/2019 0828   AST 13 05/23/2017 0828   ALT 21 05/22/2019 0828   ALT 15 05/23/2017 0828   BILITOT 0.5 05/22/2019 0828   BILITOT 0.37 05/23/2017 0828     IMPRESSION: 1. Stable pulmonary nodules. 2. Stable signs of cysto prostatectomy and creation of conduit diversion with mild left ureteral and renal pelvic dilation, not changed. Attention on follow-up. 3. Redemonstration of destructive right pubic ramus lesion. 4. Redemonstration of atherosclerotic changes in the thoracic  and abdominal aorta. 5.  Aortic Atherosclerosis (ICD10-I70.0).   Impression and Plan:  78 year old man with:   1.  Stage IV bladder cancer documented in 2019 with pulmonary and bone involvement.   The natural course of this disease was updated today as well as treatment options.  CT scan obtained on 05/22/2019 was personally reviewed and continues to show stable disease.  Treatment options at this time were reviewed which include continued active surveillance versus restarting immunotherapy as well as different salvage chemotherapy options.  At this time given the stability of his disease and overall reasonable performance status have recommended continued active surveillance at this time and repeat imaging studies in 4 to 6 months.   2. Hip pain: Related to his malignancy.  He is taking oxycodone for moderate pain and Dilaudid for severe pain.  Pain is manageable at this time.  3.  Goals of care: Therapy remains palliative although aggressive measures are  warranted given his excellent performance status.  4.  Pruritus: Continues to be an issue and following up with allergy specialist at this time.  5.  Follow-up: He will return in 3 months and tentatively repeat CT scan in 6 months.  25  minutes was spent with the patient face-to-face today.  More than 50% of time was dedicated to updating his disease status, reviewing imaging studies and discussing future plan of care.  Zola Button, MD 10/8/20208:23 AM

## 2019-05-30 ENCOUNTER — Telehealth: Payer: Self-pay

## 2019-05-30 NOTE — Telephone Encounter (Signed)
Patient stopped by to bring paperwork for Tammy.  Patient is breaking out in the rash still and he is requesting a z pack to clear him up some until he does his injection. He doesn't really want prednisone.  [place]

## 2019-05-30 NOTE — Telephone Encounter (Signed)
Dr. Gallagher please advise.  

## 2019-05-31 ENCOUNTER — Telehealth: Payer: Self-pay | Admitting: *Deleted

## 2019-05-31 NOTE — Telephone Encounter (Signed)
Called and discussed with patient [name] for hives.  He has MCR and supplement so we will buy and bill his medication and will be $0 out of pocket for him.  Made appt to start 10/21 since he is going to beach today and cant come in to office tomorrow.

## 2019-06-01 MED ORDER — PREDNISONE 10 MG PO TABS: 10.0000 mg | ORAL_TABLET | Freq: Two times a day (BID) | ORAL | 0 refills | Status: AC

## 2019-06-01 NOTE — Telephone Encounter (Signed)
Left a message with wife to have him call us back

## 2019-06-01 NOTE — Telephone Encounter (Signed)
I have no idea what he is talking about.  Azithromycin does not treat hives.  If he is having a breakout, he needs prednisone.  If he does not like the effects of prednisone, we can go with the low-dose prednisone like 10 mg twice a day for 5 days.  Salvatore Marvel, MD [facility] [place] [facility] [place]

## 2019-06-01 NOTE — Telephone Encounter (Signed)
Prednisone was sent to pharmacy. Still awaiting call back from pt.

## 2019-06-01 NOTE — Addendum Note (Signed)
Addended by: Katherina Right D on: 06/01/2019 02:43 PM   Modules accepted: Orders

## 2019-06-04 NOTE — Telephone Encounter (Signed)
Called patient and left voicemail asking to return call to discuss.

## 2019-06-06 ENCOUNTER — Telehealth: Payer: Self-pay

## 2019-06-06 ENCOUNTER — Ambulatory Visit: Payer: Medicare Other

## 2019-06-06 ENCOUNTER — Other Ambulatory Visit: Payer: Self-pay | Admitting: Medical

## 2019-06-06 DIAGNOSIS — N390 Urinary tract infection, site not specified: Secondary | ICD-10-CM

## 2019-06-06 MED ORDER — SULFAMETHOXAZOLE-TRIMETHOPRIM 800-160 MG PO TABS: 1.0000 | ORAL_TABLET | Freq: Two times a day (BID) | ORAL | 0 refills | Status: DC

## 2019-06-06 NOTE — Telephone Encounter (Signed)
Spoke to [employer], PA in the [facility]. [name], PA stated he will call in a prescription for an antibiotic for patient since his symptoms are similar to when he was last seen in [facility] on 04/24/19 for a UTI. Patient informed of prescription being sent to his pharmacy and informed patient to call office if his symptoms do not improve or become worse. Patient verbalized understanding.

## 2019-06-06 NOTE — Telephone Encounter (Signed)
-----   Message from [name], MD sent at 06/06/2019  3:46 PM EDT ----- Regarding: RE: Patient advice request [facility] please on 10/22. Thanks ----- Message ----- From: Teodoro Spray, RN Sent: 06/06/2019   3:39 PM EDT To: [name], MD Subject: Patient advice request                         Patient called office stating "I think I have a kidney infection and need to be seen tomorrow to test for it". Patient denies fever, but reports darkened urine in urostomy bag and L sided flank pain. Patient states symptoms are similar to when he last seen for UTI on 04/24/19.   Please advise.

## 2019-06-11 ENCOUNTER — Other Ambulatory Visit: Payer: Self-pay | Admitting: Medical

## 2019-06-11 ENCOUNTER — Other Ambulatory Visit: Payer: Self-pay

## 2019-06-11 ENCOUNTER — Telehealth: Payer: Self-pay | Admitting: *Deleted

## 2019-06-11 ENCOUNTER — Inpatient Hospital Stay (HOSPITAL_BASED_OUTPATIENT_CLINIC_OR_DEPARTMENT_OTHER): Payer: Medicare Other | Admitting: Medical

## 2019-06-11 VITALS — BP 155/98 | HR 98 | Temp 98.0°F | Resp 17 | Ht 71.0 in | Wt 206.9 lb

## 2019-06-11 DIAGNOSIS — Z23 Encounter for immunization: Secondary | ICD-10-CM | POA: Diagnosis not present

## 2019-06-11 DIAGNOSIS — R61 Generalized hyperhidrosis: Secondary | ICD-10-CM

## 2019-06-11 DIAGNOSIS — C679 Malignant neoplasm of bladder, unspecified: Secondary | ICD-10-CM | POA: Diagnosis not present

## 2019-06-11 DIAGNOSIS — R319 Hematuria, unspecified: Secondary | ICD-10-CM | POA: Diagnosis not present

## 2019-06-11 DIAGNOSIS — R300 Dysuria: Secondary | ICD-10-CM

## 2019-06-11 DIAGNOSIS — D61818 Other pancytopenia: Secondary | ICD-10-CM

## 2019-06-11 DIAGNOSIS — A419 Sepsis, unspecified organism: Secondary | ICD-10-CM | POA: Diagnosis not present

## 2019-06-11 DIAGNOSIS — C7951 Secondary malignant neoplasm of bone: Secondary | ICD-10-CM | POA: Diagnosis not present

## 2019-06-11 DIAGNOSIS — N390 Urinary tract infection, site not specified: Secondary | ICD-10-CM | POA: Diagnosis not present

## 2019-06-11 LAB — URINALYSIS, COMPLETE (UACMP) WITH MICROSCOPIC
Bilirubin Urine: NEGATIVE
Glucose, UA: NEGATIVE mg/dL
Hgb urine dipstick: NEGATIVE
Ketones, ur: NEGATIVE mg/dL
Leukocytes,Ua: NEGATIVE
Nitrite: NEGATIVE
Protein, ur: NEGATIVE mg/dL
Specific Gravity, Urine: 1.012 (ref 1.005–1.030)
pH: 6 (ref 5.0–8.0)

## 2019-06-11 LAB — CMP (CANCER CENTER ONLY)
ALT: 28 U/L (ref 0–44)
AST: 15 U/L (ref 15–41)
Albumin: 2.7 g/dL — ABNORMAL LOW (ref 3.5–5.0)
Alkaline Phosphatase: 57 U/L (ref 38–126)
Anion gap: 12 (ref 5–15)
BUN: 17 mg/dL (ref 8–23)
CO2: 18 mmol/L — ABNORMAL LOW (ref 22–32)
Calcium: 9 mg/dL (ref 8.9–10.3)
Chloride: 106 mmol/L (ref 98–111)
Creatinine: 1.04 mg/dL (ref 0.61–1.24)
GFR, Est AFR Am: >60 mL/min (ref >60)
GFR, Estimated: >60 mL/min (ref >60)
Glucose, Bld: 134 mg/dL — ABNORMAL HIGH (ref 70–99)
Potassium: 4.3 mmol/L (ref 3.5–5.1)
Sodium: 136 mmol/L (ref 135–145)
Total Bilirubin: 0.3 mg/dL (ref 0.3–1.2)
Total Protein: 6.1 g/dL — ABNORMAL LOW (ref 6.5–8.1)

## 2019-06-11 LAB — CBC WITH DIFFERENTIAL (CANCER CENTER ONLY)
Abs Immature Granulocytes: 0.04 10*3/uL (ref 0.00–0.07)
Basophils Absolute: 0 10*3/uL (ref 0.0–0.1)
Basophils Relative: 0 %
Eosinophils Absolute: 0 10*3/uL (ref 0.0–0.5)
Eosinophils Relative: 0 %
HCT: 42.4 % (ref 39.0–52.0)
Hemoglobin: 14.3 g/dL (ref 13.0–17.0)
Immature Granulocytes: 1 %
Lymphocytes Relative: 5 %
Lymphs Abs: 0.4 10*3/uL — ABNORMAL LOW (ref 0.7–4.0)
MCH: 35.8 pg — ABNORMAL HIGH (ref 26.0–34.0)
MCHC: 33.7 g/dL (ref 30.0–36.0)
MCV: 106 fL — ABNORMAL HIGH (ref 80.0–100.0)
Monocytes Absolute: 0.4 10*3/uL (ref 0.1–1.0)
Monocytes Relative: 6 %
Neutro Abs: 6 10*3/uL (ref 1.7–7.7)
Neutrophils Relative %: 88 %
Platelet Count: 225 10*3/uL (ref 150–400)
RBC: 4 MIL/uL — ABNORMAL LOW (ref 4.22–5.81)
RDW: 12.8 % (ref 11.5–15.5)
WBC Count: 6.9 10*3/uL (ref 4.0–10.5)
nRBC: 0 % (ref 0.0–0.2)

## 2019-06-11 LAB — SAMPLE TO BLOOD BANK

## 2019-06-11 MED ORDER — METHYLPREDNISOLONE 4 MG PO TBPK: ORAL_TABLET | ORAL | 0 refills | Status: DC

## 2019-06-11 NOTE — Progress Notes (Signed)
Pt presents with concerns about possible UTI.  Denies polyuria, dysuria, hematuria, or abd/back/pelvic pain, however pt has urostomy at baseline.  Denies rash/bleeding/signs of infection around insertion site.  Reports fatigue, sweats, and 'feeling really awful' with anorexia and mild nausea.  A&Ox4.  Afebrile.

## 2019-06-11 NOTE — Telephone Encounter (Signed)
Patient called to report that he has been taking the Antibiotic that [name], PA prescribed and he feels horrible.  Describes sweating, lethargy.  He denies any abdominal discomfort or back pain, fever.  He does report a rash but stated that he has had that and is taking Prednisone for that that was prescribed by [name].  Per [name], PA he wants to see if patient can come into the [facility] clinic today to have labs and be seen since Antibiotic has been unsuccessful.  Patient agrees to visit and will come in today.  Dr. Alen Blew also notified as he is the primary MD for patient.

## 2019-06-11 NOTE — Patient Instructions (Signed)
COVID-19: How to Protect Yourself and Others Know how it spreads  There is currently no vaccine to prevent coronavirus disease 2019 (COVID-19).  The best way to prevent illness is to avoid being exposed to this virus.  The virus is thought to spread mainly from person-to-person. ? Between people who are in close contact with one another (within about 6 feet). ? Through respiratory droplets produced when an infected person coughs, sneezes or talks. ? These droplets can land in the mouths or noses of people who are nearby or possibly be inhaled into the lungs. ? Some recent studies have suggested that COVID-19 may be spread by people who are not showing symptoms. Everyone should Clean your hands often  Wash your hands often with soap and water for at least 20 seconds especially after you have been in a public place, or after blowing your nose, coughing, or sneezing.  If soap and water are not readily available, use a hand sanitizer that contains at least 60% alcohol. Cover all surfaces of your hands and rub them together until they feel dry.  Avoid touching your eyes, nose, and mouth with unwashed hands. Avoid close contact  Stay home if you are sick.  Avoid close contact with people who are sick.  Put distance between yourself and other people. ? Remember that some people without symptoms may be able to spread virus. ? This is especially important for people who are at higher risk of getting very sick.www.cdc.gov/coronavirus/2019-ncov/need-extra-precautions/people-at-higher-risk.html Cover your mouth and nose with a cloth face cover when around others  You could spread COVID-19 to others even if you do not feel sick.  Everyone should wear a cloth face cover when they have to go out in public, for example to the grocery store or to pick up other necessities. ? Cloth face coverings should not be placed on young children under age 2, anyone who has trouble breathing, or is unconscious,  incapacitated or otherwise unable to remove the mask without assistance.  The cloth face cover is meant to protect other people in case you are infected.  Do NOT use a facemask meant for a healthcare worker.  Continue to keep about 6 feet between yourself and others. The cloth face cover is not a substitute for social distancing. Cover coughs and sneezes  If you are in a private setting and do not have on your cloth face covering, remember to always cover your mouth and nose with a tissue when you cough or sneeze or use the inside of your elbow.  Throw used tissues in the trash.  Immediately wash your hands with soap and water for at least 20 seconds. If soap and water are not readily available, clean your hands with a hand sanitizer that contains at least 60% alcohol. Clean and disinfect  Clean AND disinfect frequently touched surfaces daily. This includes tables, doorknobs, light switches, countertops, handles, desks, phones, keyboards, toilets, faucets, and sinks. www.cdc.gov/coronavirus/2019-ncov/prevent-getting-sick/disinfecting-your-home.html  If surfaces are dirty, clean them: Use detergent or soap and water prior to disinfection.  Then, use a household disinfectant. You can see a list of EPA-registered household disinfectants here. cdc.gov/coronavirus 12/19/2018 This information is not intended to replace advice given to you by your health care provider. Make sure you discuss any questions you have with your health care provider. Document Released: 11/28/2018 Document Revised: 12/27/2018 Document Reviewed: 11/28/2018 Elsevier Patient Education  2020 [employer].  

## 2019-06-11 NOTE — Telephone Encounter (Signed)
Agree. Thanks

## 2019-06-11 NOTE — Progress Notes (Signed)
[facility] Progress Note   [name] [id number] Dec 08, 1940 78 y.o.  [name] is managed by Dr. Zola Button  Actively treated with chemotherapy/immunotherapy/hormonal therapy: active surveillance  Last treated: April 2020  Next scheduled appointment with provider: to be scheduled  Assessment: Plan:    Sepsis due to urinary tract infection ([place])  Pancytopenia ([place])  Urinary tract infection with hematuria, site unspecified  Malignant neoplasm of urinary bladder, unspecified site [facility])  Bone metastasis ([place])   History of UTI's with sepsis: [name] has been on Bactrim DS twice daily since 06/06/2019. He developed sweats over the weekend. A CBC, chemistry panel, urinalysis, urine cultures, and blood cultures x 2 have been collected today.  [name] was told to stop Bactrim while we wait on his cultures.  He will be contacted to set these results were available.  History of pancytopenia: A CBC was collected today.  His CBC returned with a WBC of 6.9, hemoglobin of 14.3, hematocrit at 42.4 and platelet count at 225.  Metastatic malignant neoplasm of the urinary bladder with bone metastasis: [name] is followed by Dr. Alen Blew and is currently not receiving therapy but is being managed with active surveillance. He currently does not have a follow up visit scheduled.   Please see After Visit Summary for patient specific instructions.  Future Appointments  Date Time Provider [place]  06/11/2019 11:15 AM [facility] 3 [facility] None  06/11/2019 11:30 AM Harle Stanford., PA-C [facility] None  07/27/2019 10:30 AM Valentina Shaggy, MD [facility] None    No orders of the defined types were placed in this encounter.      Subjective:   Patient ID:  [name] is a 78 y.o. (DOB 04-15-1941) male.  Chief Complaint: No chief complaint on file.   HPI [name]  Is a 78 y.o. male with a diagnosis of a metastatic malignant  neoplasm of the urinary bladder with bone metastasis. He is followed by Dr. Alen Blew and is managed with active surveillance. He has a history of UTI's with sepsis. He is currently on Bactrim DS twice daily since 06/06/2019. [name] called this morning reporting that feels horrible. He reported sweats and lethargy.  He denied abdominal discomfort, back pain, and fevers.  He has an ongoing rash for which he is taking Prednisone for that was prescribed by [name].  He reports that he has sweats, fatigue, and ongoing rash for which she has been on a steroid taper, and anorexia.  He is drinking adequate fluid.  Medications: I have reviewed the patient's current medications.  Allergies:  Allergies  Allergen Reactions   Ciprofloxacin Hives   Cefepime Rash    Past Medical History:  Diagnosis Date   Bladder cancer ([place])    BPH (benign prostatic hypertrophy)    Dysuria    GERD (gastroesophageal reflux disease)    History of adenomatous polyp of colon    tubular adenoma 06/ 2018   History of bladder cancer 12/2014   urologist-  dr [name]--  dx High Grade TCC without stromal invasion s/p TURBT and intravesical BCG tx/  recurrent 07/2015 (pTa) TCC  repear intravesical BCG tx    History of hiatal hernia    History of urethral stricture    Nocturia     Past Surgical History:  Procedure Laterality Date   CARDIAC CATHETERIZATION  1997   normal coronary arteries   CARDIOVASCULAR STRESS TEST  12-14-2005   Low risk perfusion  study/  very small scar in the inferior septum from mid ventricle to apex,  no ischemia/  mild inferior septal hypokinesis/  ef 58%   CATARACT EXTRACTION W/ INTRAOCULAR LENS  IMPLANT, BILATERAL  2011   COLONOSCOPY  last one 06/ 2018   CYSTOSCOPY WITH BIOPSY N/A 08/12/2015   Procedure: CYSTOSCOPY WITH BIOPSY AND FULGERATION;  Surgeon: [name], MD;  Location: [facility];  Service: Urology;  Laterality: N/A;   CYSTOSCOPY WITH  INJECTION N/A 06/29/2017   Procedure: CYSTOSCOPY WITH INJECTION OF INDOCYANINE GREEN DYE;  Surgeon: Alexis Frock, MD;  Location: [facility];  Service: Urology;  Laterality: N/A;   CYSTOSCOPY WITH URETHRAL DILATATION N/A 03/21/2017   Procedure: CYSTOSCOPY;  Surgeon: [name], MD;  Location: [facility];  Service: Urology;  Laterality: N/A;   ESOPHAGOGASTRODUODENOSCOPY  12-05-2014   IR IMAGING GUIDED PORT INSERTION  02/07/2018   IR REMOVAL TUN ACCESS W/ PORT W/O FL MOD SED  04/14/2018   TRANSTHORACIC ECHOCARDIOGRAM  01-31-2008   nomral LVF,  ef 55-65%/  mild pulmonic stenosis without regurg.,  peak transpulomonic valve gradient 87mmHg   TRANSURETHRAL RESECTION OF BLADDER TUMOR N/A 12/31/2014   Procedure: TRANSURETHRAL RESECTION OF BLADDER TUMOR (TURBT);  Surgeon: Lowella Bandy, MD;  Location: [facility];  Service: Urology;  Laterality: N/A;   TRANSURETHRAL RESECTION OF BLADDER TUMOR N/A 03/21/2017   Procedure: POSSIBLE TRANSURETHRAL RESECTION OF BLADDER TUMOR (TURBT);  Surgeon: [name], MD;  Location: [facility];  Service: Urology;  Laterality: N/A;    Family History  Problem Relation Age of Onset   Alcoholism Father    Colon cancer Neg Hx    Colon polyps Neg Hx    Diabetes Neg Hx    Kidney disease Neg Hx    Esophageal cancer Neg Hx    Heart disease Neg Hx    Gallbladder disease Neg Hx     Social History   Socioeconomic History   Marital status: Married    Spouse name: Not on file   Number of children: 1   Years of education: Not on file   Highest education level: Not on file  Occupational History   Occupation: Retired  Scientist, product/process development strain: Not on file   Food insecurity    Worry: Not on file    Inability: Not on Lexicographer needs    Medical: Not on file    Non-medical: Not on file  Tobacco Use   Smoking status: Former Smoker    Packs/day: 1.00    Years: 34.00      Pack years: 34.00    Types: Cigarettes    Quit date: 12/26/1988    Years since quitting: 30.4   Smokeless tobacco: Never Used  Substance and Sexual Activity   Alcohol use: Yes    Alcohol/week: 6.0 standard drinks    Types: 6 Cans of beer per week    Comment: BEER   Drug use: No   Sexual activity: Never  Lifestyle   Physical activity    Days per week: Not on file    Minutes per session: Not on file   Stress: Not on file  Relationships   Social connections    Talks on phone: Not on file    Gets together: Not on file    Attends religious service: Not on file    Active member of club or organization: Not on file    Attends meetings  of clubs or organizations: Not on file    Relationship status: Not on file   Intimate partner violence    Fear of current or ex partner: Not on file    Emotionally abused: Not on file    Physically abused: Not on file    Forced sexual activity: Not on file  Other Topics Concern   Not on file  Social History Narrative   Not on file    Past Medical History, Surgical history, Social history, and Family history were reviewed and updated as appropriate.   Please see review of systems for further details on the patient's review from today.   Review of Systems:  Review of Systems  Constitutional: Positive for appetite change, diaphoresis and fatigue. Negative for chills and fever.  HENT: Negative for trouble swallowing.   Respiratory: Negative for cough and shortness of breath.   Cardiovascular: Negative for chest pain, palpitations and leg swelling.  Gastrointestinal: Negative for abdominal pain, constipation, diarrhea, nausea and vomiting.  Genitourinary: Negative for difficulty urinating, dysuria, flank pain, frequency, hematuria and urgency.  Skin: Positive for rash.  Neurological: Negative for dizziness and headaches.    Objective:   Physical Exam:  There were no vitals taken for this visit. ECOG: 1  Physical  Exam Constitutional:      General: He is not in acute distress.    Appearance: Normal appearance. He is not ill-appearing, toxic-appearing or diaphoretic.  HENT:     Head: Normocephalic and atraumatic.  Eyes:     General: No scleral icterus.       Right eye: No discharge.        Left eye: No discharge.     Conjunctiva/sclera: Conjunctivae normal.  Neurological:     Mental Status: He is alert.     Coordination: Coordination normal.     Gait: Gait normal.  Psychiatric:        Mood and Affect: Mood normal.        Behavior: Behavior normal.        Thought Content: Thought content normal.        Judgment: Judgment normal.     Lab Review:     Component Value Date/Time   NA 141 05/22/2019 0828   NA 140 04/30/2019 1033   NA 141 05/23/2017 0828   K 4.0 05/22/2019 0828   K 4.0 05/23/2017 0828   CL 106 05/22/2019 0828   CO2 26 05/22/2019 0828   CO2 27 05/23/2017 0828   GLUCOSE 99 05/22/2019 0828   GLUCOSE 97 05/23/2017 0828   BUN 14 05/22/2019 0828   BUN 16 04/30/2019 1033   BUN 12.5 05/23/2017 0828   CREATININE 0.78 05/22/2019 0828   CREATININE 0.8 05/23/2017 0828   CALCIUM 8.9 05/22/2019 0828   CALCIUM 9.0 05/23/2017 0828   PROT 5.6 (L) 05/22/2019 0828   PROT 5.7 (L) 04/30/2019 1033   PROT 5.6 (L) 05/23/2017 0828   ALBUMIN 2.7 (L) 05/22/2019 0828   ALBUMIN 3.7 04/30/2019 1033   ALBUMIN 2.5 (L) 05/23/2017 0828   AST 13 (L) 05/22/2019 0828   AST 13 05/23/2017 0828   ALT 21 05/22/2019 0828   ALT 15 05/23/2017 0828   ALKPHOS 60 05/22/2019 0828   ALKPHOS 57 05/23/2017 0828   BILITOT 0.5 05/22/2019 0828   BILITOT 0.37 05/23/2017 0828   GFRNONAA >60 05/22/2019 0828   GFRAA >60 05/22/2019 0828       Component Value Date/Time   WBC 11.3 (H) 05/22/2019 0828   WBC  13.6 (H) 03/09/2019 1116   RBC 3.84 (L) 05/22/2019 0828   HGB 13.7 05/22/2019 0828   HGB 12.7 (L) 05/23/2017 0828   HCT 41.1 05/22/2019 0828   HCT 37.3 (L) 05/23/2017 0828   PLT 190 05/22/2019 0828   PLT  298 05/23/2017 0828   MCV 107.0 (H) 05/22/2019 0828   MCV 104.2 (H) 05/23/2017 0828   MCH 35.7 (H) 05/22/2019 0828   MCHC 33.3 05/22/2019 0828   RDW 13.8 05/22/2019 0828   RDW 13.6 05/23/2017 0828   LYMPHSABS 1.0 05/22/2019 0828   LYMPHSABS 0.8 (L) 05/23/2017 0828   MONOABS 1.0 05/22/2019 0828   MONOABS 1.0 (H) 05/23/2017 0828   EOSABS 0.1 05/22/2019 0828   EOSABS 0.1 05/23/2017 0828   BASOSABS 0.1 05/22/2019 0828   BASOSABS 0.0 05/23/2017 0828   -------------------------------  Imaging from last 24 hours (if applicable):  Radiology interpretation: Ct Chest W Contrast  Result Date: 05/22/2019 CLINICAL DATA:  History of invasive bladder cancer. Post cystoprostatectomy and urinary diversion with history also of chemo and radiotherapy. EXAM: CT CHEST, ABDOMEN, AND PELVIS WITH CONTRAST TECHNIQUE: Multidetector CT imaging of the chest, abdomen and pelvis was performed following the standard protocol during bolus administration of intravenous contrast. CONTRAST:  170mL OMNIPAQUE IOHEXOL 300 MG/ML  SOLN COMPARISON:  01/24/2019 and 09/11/2018 FINDINGS: CT CHEST FINDINGS Cardiovascular: No signs of acute vascular process. Scattered atherosclerosis. Atherosclerosis is mild to moderate in the chest. No aneurysmal dilation. Calcified coronary artery disease. Heart size normal without pericardial effusion. Central pulmonary vasculature is normal. Mediastinum/Nodes: No signs of mediastinal or hilar lymphadenopathy. No signs of axillary adenopathy.  No supraclavicular adenopathy. Lungs/Pleura: Calcified tiny pulmonary nodules indicative of prior granulomatous disease are unchanged. Stable appearance of left upper lobe, spiculated pulmonary nodule. Measuring approximately 1 cm (image 26, series 6.) no signs of pleural effusion or evidence of consolidation. Small pleural based nodule in the anterior left chest also unchanged as is a tiny parenchymal nodule on image 42 that has a more linear appearance than  nodular appearing approximately 0.5 cm in greatest dimension. Musculoskeletal: Signs of bilateral gynecomastia without chest wall mass. CT ABDOMEN PELVIS FINDINGS Hepatobiliary: Cysts in the liver with similar appearance to previous study. No signs of biliary ductal dilation. Gallbladder is normal. Pancreas: No signs of pancreatic lesion or inflammation. No ductal dilation. Spleen: Normal in size without focal abnormality. Adrenals/Urinary Tract: Normal appearance of bilateral adrenal glands. Mild dilation of the left extrarenal pelvis and left ureter following ileal conduit diversion with similar appearance compared to previous exam. Stomach/Bowel: Small hiatal hernia. Small duodenal diverticulum. No signs of acute process related to small bowel or colon. Postoperative changes related to conduit diversion. Vascular/Lymphatic: Calcified and noncalcified atherosclerotic plaque throughout the abdominal aorta extending into the iliac vessels is mild-to-moderate. No retroperitoneal or upper abdominal lymphadenopathy. No pelvic lymphadenopathy following cysto prostatectomy and lymphadenectomy. Reproductive: Postoperative changes as described. Other: No signs of free air. No signs of focal fluid. Appendix is normal. Musculoskeletal: Redemonstration of destructive lesion along the right inferior pubic ramus. No new destructive bone process. Current measurements of soft tissue proximally 5.6 x 3.2 cm. Appearance unchanged compared to prior study. IMPRESSION: 1. Stable pulmonary nodules. 2. Stable signs of cysto prostatectomy and creation of conduit diversion with mild left ureteral and renal pelvic dilation, not changed. Attention on follow-up. 3. Redemonstration of destructive right pubic ramus lesion. 4. Redemonstration of atherosclerotic changes in the thoracic and abdominal aorta. 5.  Aortic Atherosclerosis (ICD10-I70.0). Electronically Signed   By: Cay Schillings  Wile M.D.   On: 05/22/2019 11:47   Ct Abdomen Pelvis W  Contrast  Result Date: 05/22/2019 CLINICAL DATA:  History of invasive bladder cancer. Post cystoprostatectomy and urinary diversion with history also of chemo and radiotherapy. EXAM: CT CHEST, ABDOMEN, AND PELVIS WITH CONTRAST TECHNIQUE: Multidetector CT imaging of the chest, abdomen and pelvis was performed following the standard protocol during bolus administration of intravenous contrast. CONTRAST:  133mL OMNIPAQUE IOHEXOL 300 MG/ML  SOLN COMPARISON:  01/24/2019 and 09/11/2018 FINDINGS: CT CHEST FINDINGS Cardiovascular: No signs of acute vascular process. Scattered atherosclerosis. Atherosclerosis is mild to moderate in the chest. No aneurysmal dilation. Calcified coronary artery disease. Heart size normal without pericardial effusion. Central pulmonary vasculature is normal. Mediastinum/Nodes: No signs of mediastinal or hilar lymphadenopathy. No signs of axillary adenopathy.  No supraclavicular adenopathy. Lungs/Pleura: Calcified tiny pulmonary nodules indicative of prior granulomatous disease are unchanged. Stable appearance of left upper lobe, spiculated pulmonary nodule. Measuring approximately 1 cm (image 26, series 6.) no signs of pleural effusion or evidence of consolidation. Small pleural based nodule in the anterior left chest also unchanged as is a tiny parenchymal nodule on image 42 that has a more linear appearance than nodular appearing approximately 0.5 cm in greatest dimension. Musculoskeletal: Signs of bilateral gynecomastia without chest wall mass. CT ABDOMEN PELVIS FINDINGS Hepatobiliary: Cysts in the liver with similar appearance to previous study. No signs of biliary ductal dilation. Gallbladder is normal. Pancreas: No signs of pancreatic lesion or inflammation. No ductal dilation. Spleen: Normal in size without focal abnormality. Adrenals/Urinary Tract: Normal appearance of bilateral adrenal glands. Mild dilation of the left extrarenal pelvis and left ureter following ileal conduit  diversion with similar appearance compared to previous exam. Stomach/Bowel: Small hiatal hernia. Small duodenal diverticulum. No signs of acute process related to small bowel or colon. Postoperative changes related to conduit diversion. Vascular/Lymphatic: Calcified and noncalcified atherosclerotic plaque throughout the abdominal aorta extending into the iliac vessels is mild-to-moderate. No retroperitoneal or upper abdominal lymphadenopathy. No pelvic lymphadenopathy following cysto prostatectomy and lymphadenectomy. Reproductive: Postoperative changes as described. Other: No signs of free air. No signs of focal fluid. Appendix is normal. Musculoskeletal: Redemonstration of destructive lesion along the right inferior pubic ramus. No new destructive bone process. Current measurements of soft tissue proximally 5.6 x 3.2 cm. Appearance unchanged compared to prior study. IMPRESSION: 1. Stable pulmonary nodules. 2. Stable signs of cysto prostatectomy and creation of conduit diversion with mild left ureteral and renal pelvic dilation, not changed. Attention on follow-up. 3. Redemonstration of destructive right pubic ramus lesion. 4. Redemonstration of atherosclerotic changes in the thoracic and abdominal aorta. 5.  Aortic Atherosclerosis (ICD10-I70.0). Electronically Signed   By: Zetta Bills M.D.   On: 05/22/2019 11:47

## 2019-06-12 NOTE — Progress Notes (Signed)
These preliminary result these preliminary results were noted.  Awaiting final report.

## 2019-06-13 LAB — URINE CULTURE: Culture: >=100000 — AB

## 2019-06-13 NOTE — Progress Notes (Signed)
These preliminary result these preliminary results were noted.  Awaiting final report.

## 2019-06-14 NOTE — Progress Notes (Signed)
These preliminary result these preliminary results were noted.  Awaiting final report.

## 2019-06-15 NOTE — Progress Notes (Signed)
These preliminary result these preliminary results were noted.  Awaiting final report.

## 2019-06-16 LAB — CULTURE, BLOOD (SINGLE)
Culture: NO GROWTH
Culture: NO GROWTH

## 2019-06-19 DIAGNOSIS — E669 Obesity, unspecified: Secondary | ICD-10-CM | POA: Diagnosis not present

## 2019-06-19 DIAGNOSIS — G894 Chronic pain syndrome: Secondary | ICD-10-CM | POA: Diagnosis not present

## 2019-06-19 DIAGNOSIS — K219 Gastro-esophageal reflux disease without esophagitis: Secondary | ICD-10-CM | POA: Diagnosis not present

## 2019-06-19 DIAGNOSIS — J449 Chronic obstructive pulmonary disease, unspecified: Secondary | ICD-10-CM | POA: Diagnosis not present

## 2019-06-19 DIAGNOSIS — Z6828 Body mass index (BMI) 28.0-28.9, adult: Secondary | ICD-10-CM | POA: Diagnosis not present

## 2019-06-19 DIAGNOSIS — Z0001 Encounter for general adult medical examination with abnormal findings: Secondary | ICD-10-CM | POA: Diagnosis not present

## 2019-06-19 DIAGNOSIS — I10 Essential (primary) hypertension: Secondary | ICD-10-CM | POA: Diagnosis not present

## 2019-06-19 DIAGNOSIS — Z1389 Encounter for screening for other disorder: Secondary | ICD-10-CM | POA: Diagnosis not present

## 2019-06-20 ENCOUNTER — Telehealth: Payer: Self-pay | Admitting: Oncology

## 2019-06-20 NOTE — Telephone Encounter (Signed)
Scheduled appt per 11/4 sch message - pt wife aware of appt date and time

## 2019-06-22 ENCOUNTER — Encounter (HOSPITAL_COMMUNITY): Payer: Self-pay

## 2019-06-22 ENCOUNTER — Emergency Department (HOSPITAL_COMMUNITY)
Admission: EM | Admit: 2019-06-22 | Discharge: 2019-06-22 | Disposition: A | Discharge status: Home / Self Care | Payer: Medicare Other | Attending: Emergency Medicine | Admitting: Emergency Medicine

## 2019-06-22 ENCOUNTER — Other Ambulatory Visit: Payer: Self-pay

## 2019-06-22 DIAGNOSIS — M545 Low back pain, unspecified: Secondary | ICD-10-CM

## 2019-06-22 DIAGNOSIS — Z79899 Other long term (current) drug therapy: Secondary | ICD-10-CM | POA: Insufficient documentation

## 2019-06-22 DIAGNOSIS — Z8551 Personal history of malignant neoplasm of bladder: Secondary | ICD-10-CM | POA: Insufficient documentation

## 2019-06-22 DIAGNOSIS — Z936 Other artificial openings of urinary tract status: Secondary | ICD-10-CM | POA: Diagnosis not present

## 2019-06-22 DIAGNOSIS — N390 Urinary tract infection, site not specified: Secondary | ICD-10-CM | POA: Diagnosis not present

## 2019-06-22 DIAGNOSIS — Z87891 Personal history of nicotine dependence: Secondary | ICD-10-CM | POA: Diagnosis not present

## 2019-06-22 LAB — URINALYSIS, ROUTINE W REFLEX MICROSCOPIC
Bilirubin Urine: NEGATIVE
Glucose, UA: NEGATIVE mg/dL
Ketones, ur: NEGATIVE mg/dL
Nitrite: NEGATIVE
Protein, ur: 100 mg/dL — AB
RBC / HPF: >50 RBC/hpf — ABNORMAL HIGH (ref 0–5)
Specific Gravity, Urine: 1.006 (ref 1.005–1.030)
WBC, UA: >50 WBC/hpf — ABNORMAL HIGH (ref 0–5)
pH: 6 (ref 5.0–8.0)

## 2019-06-22 MED ORDER — CEPHALEXIN 500 MG PO CAPS: 500.0000 mg | ORAL_CAPSULE | Freq: Four times a day (QID) | ORAL | 0 refills | Status: DC

## 2019-06-22 NOTE — ED Triage Notes (Addendum)
Pt reports he woke up @0430  with sharp mid to lower back pain. Pt has bladder cancer and currently receiving chemo. No injury reported. Pt has urostomy. Pt took morphine without relief

## 2019-06-22 NOTE — ED Provider Notes (Signed)
[facility] EMERGENCY DEPARTMENT Provider Note   CSN: [id number] Arrival date & time: 06/22/19  [id number]     History   Chief Complaint Chief Complaint  Patient presents with  . Back Pain    HPI [name] is a 78 y.o. male.     Pt reports waking up at 4:30 AM with sharpe mid to lower back pain. He rreports this pain was worse than usual. She could not find a comfortable position. He took medication for pain and was gradually able to find rest. No reported fever, no vomiting, and no LOC or altered mental status. The pain continued this AM and he came to the ED for assistance.  Pt has a hx of bladder cancer and has a urostomy.  He has a hx of GERD, and bacteremia. He is currently rreceiving chemo.  The history is provided by the patient.  Back Pain Associated symptoms: no abdominal pain, no chest pain and no dysuria     Past Medical History:  Diagnosis Date  . Bladder cancer ([place])   . BPH (benign prostatic hypertrophy)   . Dysuria   . GERD (gastroesophageal reflux disease)   . History of adenomatous polyp of colon    tubular adenoma 06/ 2018  . History of bladder cancer 12/2014   urologist-  dr [name]--  dx High Grade TCC without stromal invasion s/p TURBT and intravesical BCG tx/  recurrent 07/2015 (pTa) TCC  repear intravesical BCG tx   . History of hiatal hernia   . History of urethral stricture   . Nocturia     Patient Active Problem List   Diagnosis Date Noted  . Redness and swelling of thigh 06/05/2018  . Adverse drug reaction 06/05/2018  . Bacteremia associated with intravascular line ([place]) 04/20/2018  . Febrile neutropenia ([place]) 04/13/2018  . Gram positive sepsis ([place]) 04/13/2018  . SIRS (systemic inflammatory response syndrome) ([place]) 04/12/2018  . Bacteremia due to Gram-positive bacteria 04/12/2018  . Pancytopenia ([place]) 04/12/2018  . Status post chemotherapy   . UTI (urinary tract infection) 04/11/2018  . Port-A-Cath in place 02/20/2018  . Encounter for  antineoplastic chemotherapy 02/17/2018  . Sepsis due to urinary tract infection ([place]) 01/25/2018  . Sepsis secondary to UTI ([place]) 01/25/2018  . Hyperglycemia   . Goals of care, counseling/discussion 01/18/2018  . Bone metastasis ([place]) 12/29/2017  . Status post ileal conduit ([place]) 06/29/2017  . Bladder cancer ([place]) 06/28/2017  . Malignant neoplasm of urinary bladder ([place]) 04/20/2017  . COPD UNSPECIFIED 03/07/2008    Past Surgical History:  Procedure Laterality Date  . CARDIAC CATHETERIZATION  1997   normal coronary arteries  . CARDIOVASCULAR STRESS TEST  12-14-2005   Low risk perfusion study/  very small scar in the inferior septum from mid ventricle to apex,  no ischemia/  mild inferior septal hypokinesis/  ef 58%  . CATARACT EXTRACTION W/ INTRAOCULAR LENS  IMPLANT, BILATERAL  2011  . COLONOSCOPY  last one 06/ 2018  . CYSTOSCOPY WITH BIOPSY N/A 08/12/2015   Procedure: CYSTOSCOPY WITH BIOPSY AND FULGERATION;  Surgeon: [name], MD;  Location: [facility];  Service: Urology;  Laterality: N/A;  . CYSTOSCOPY WITH INJECTION N/A 06/29/2017   Procedure: CYSTOSCOPY WITH INJECTION OF INDOCYANINE GREEN DYE;  Surgeon: Alexis Frock, MD;  Location: [facility];  Service: Urology;  Laterality: N/A;  . CYSTOSCOPY WITH URETHRAL DILATATION N/A 03/21/2017   Procedure: CYSTOSCOPY;  Surgeon: [name], MD;  Location: [facility];  Service:  Urology;  Laterality: N/A;  . ESOPHAGOGASTRODUODENOSCOPY  12-05-2014  . IR IMAGING GUIDED PORT INSERTION  02/07/2018  . IR REMOVAL TUN ACCESS W/ PORT W/O FL MOD SED  04/14/2018  . TRANSTHORACIC ECHOCARDIOGRAM  01-31-2008   nomral LVF,  ef 55-65%/  mild pulmonic stenosis without regurg.,  peak transpulomonic valve gradient 32mmHg  . TRANSURETHRAL RESECTION OF BLADDER TUMOR N/A 12/31/2014   Procedure: TRANSURETHRAL RESECTION OF BLADDER TUMOR (TURBT);  Surgeon: Lowella Bandy, MD;  Location: [facility];  Service:  Urology;  Laterality: N/A;  . TRANSURETHRAL RESECTION OF BLADDER TUMOR N/A 03/21/2017   Procedure: POSSIBLE TRANSURETHRAL RESECTION OF BLADDER TUMOR (TURBT);  Surgeon: [name], MD;  Location: [facility];  Service: Urology;  Laterality: N/A;        Home Medications    Prior to Admission medications   Medication Sig Start Date End Date Taking? Authorizing Provider  acetaminophen (TYLENOL) 500 MG tablet Take 1,000 mg by mouth every 6 (six) hours as needed for fever.     [provider]  diphenhydrAMINE (BENADRYL) 25 MG tablet Take 25 mg by mouth every 6 (six) hours as needed for itching.    [provider]  famotidine (PEPCID) 40 MG tablet Take 1 tablet (40 mg total) by mouth daily. 04/30/19   Ladene Artist, MD  HYDROmorphone (DILAUDID) 4 MG tablet Take 1 tablet (4 mg total) by mouth every 4 (four) hours as needed for severe pain. 05/24/19   [name], MD  lactose free nutrition (BOOST) LIQD Take 237 mLs by mouth 3 (three) times daily between meals.    [provider]  methylPREDNISolone (MEDROL DOSEPAK) 4 MG TBPK tablet 6 x 1 day, 5 x 1 day, 4 x 1 day, 3 x 1 day, 2 x 1 day, 1 x 1 day 06/11/19   Harle Stanford., PA-C  oxyCODONE (OXY IR/ROXICODONE) 5 MG immediate release tablet Take 1-2 tablets (5-10 mg total) by mouth every 4 (four) hours as needed for severe pain. 05/24/19   [name], MD    Family History Family History  Problem Relation Age of Onset  . Alcoholism Father   . Colon cancer Neg Hx   . Colon polyps Neg Hx   . Diabetes Neg Hx   . Kidney disease Neg Hx   . Esophageal cancer Neg Hx   . Heart disease Neg Hx   . Gallbladder disease Neg Hx     Social History Social History   Tobacco Use  . Smoking status: Former Smoker    Packs/day: 1.00    Years: 34.00    Pack years: 34.00    Types: Cigarettes    Quit date: 12/26/1988    Years since quitting: 30.5  . Smokeless tobacco: Never Used  Substance Use Topics   . Alcohol use: Yes    Alcohol/week: 6.0 standard drinks    Types: 6 Cans of beer per week    Comment: BEER  . Drug use: No     Allergies   Ciprofloxacin and Cefepime   Review of Systems Review of Systems  Constitutional: Negative for activity change.       All ROS Neg except as noted in HPI  HENT: Negative.   Eyes: Negative for photophobia and discharge.  Respiratory: Negative for cough, shortness of breath and wheezing.   Cardiovascular: Negative for chest pain and palpitations.  Gastrointestinal: Negative for abdominal pain and blood in stool.  Genitourinary: Negative for dysuria, frequency and  hematuria.  Musculoskeletal: Positive for back pain. Negative for arthralgias and neck pain.  Skin: Negative.   Neurological: Negative for dizziness, seizures and speech difficulty.  Psychiatric/Behavioral: Negative for confusion and hallucinations.     Physical Exam Updated Vital Signs BP (!) 143/96 (BP Location: Right Arm)   Pulse (!) 111   Temp 98.1 F (36.7 C) (Oral)   Resp 18   Ht 5\' 11"  (1.803 m)   Wt 93 kg   SpO2 94%   BMI 28.60 kg/m   Physical Exam Vitals signs and nursing note reviewed.  Constitutional:      Appearance: He is well-developed. He is not toxic-appearing.  HENT:     Head: Normocephalic.     Right Ear: Tympanic membrane and external ear normal.     Left Ear: Tympanic membrane and external ear normal.  Eyes:     General: Lids are normal.     Pupils: Pupils are equal, round, and reactive to light.  Neck:     Musculoskeletal: Normal range of motion and neck supple.     Vascular: No carotid bruit.  Cardiovascular:     Rate and Rhythm: Normal rate and regular rhythm.     Pulses: Normal pulses.     Heart sounds: Normal heart sounds.  Pulmonary:     Effort: No respiratory distress.     Breath sounds: Normal breath sounds.  Abdominal:     General: Bowel sounds are normal.     Palpations: Abdomen is soft.     Tenderness: There is no abdominal  tenderness. There is no guarding.     Comments: Urostomy bag on the left functioning well.  No evidence of infection about the stoma.  Musculoskeletal: Normal range of motion.     Thoracic back: He exhibits tenderness.       Back:  Lymphadenopathy:     Head:     Right side of head: No submandibular adenopathy.     Left side of head: No submandibular adenopathy.     Cervical: No cervical adenopathy.  Skin:    General: Skin is warm and dry.  Neurological:     Mental Status: He is alert and oriented to person, place, and time.     Cranial Nerves: No cranial nerve deficit.     Sensory: No sensory deficit.  Psychiatric:        Speech: Speech normal.      ED Treatments / Results  Labs (all labs ordered are listed, but only abnormal results are displayed) Labs Reviewed - No data to display  EKG None  Radiology No results found.  Procedures Procedures (including critical care time)  Medications Ordered in ED Medications - No data to display   Initial Impression / Assessment and Plan / ED Course  I have reviewed the triage vital signs and the nursing notes.  Pertinent labs & imaging results that were available during my care of the patient were reviewed by me and considered in my medical decision making (see chart for details).          Final Clinical Impressions(s) / ED Diagnoses MDM  Heart rate elevated at 111 on the initial vital signs.  Repeat check of the temperature was 99.1.  Heart rate rechecked and was 101.  We will check urine analysis.  Urostomy back was changed and UA obtained Urine was cloudy with modeerated hgb, greater than 50 RBC's and WBC's. Few bacteria present. Yeast and hyaline cast present. Question if this is from colonization  or uti. Culture sent to the lab. Pt started on keflex. Pt to return to the ED if any changes in condition.  Pt ambulatory at D/C without problem.    Final diagnoses:  Acute left-sided low back pain, unspecified  whether sciatica present  Lower urinary tract infectious disease    ED Discharge Orders         Ordered    cephALEXin (KEFLEX) 500 MG capsule  4 times daily     06/22/19 1309           Lily Kocher, PA-C 06/22/19 1846    Virgel Manifold, MD 06/23/19 [phone]

## 2019-06-22 NOTE — ED Notes (Signed)
Urostomy bag changed. Awaiting urine for urinalysis.

## 2019-06-22 NOTE — Discharge Instructions (Addendum)
We are thankful that your back pain has resolved on its own.Your urine test is abnormal. There is question if you are developing a urinary tract infection or if the findings are affected by your urostomy. Please use keflex with each meal and at bed time.   Please see your doctor or return to the emergency department if this pain returns and will not resolve, or if there are changes in your general condition, problems or concerns.

## 2019-06-23 IMAGING — CT CT ABD-PELV W/ CM
3 of 11 series · 11 of 46 positions shown, 17 images · IV contrast (APPLIED)
Comparison: Chest CT 01/03/2018.  Abdominopelvic CT 11/25/2017.

CLINICAL DATA: Bladder cancer with cystectomy in 0656. Right hip
metastasis.

EXAM:
CT CHEST, ABDOMEN, AND PELVIS WITH CONTRAST
TECHNIQUE: Multidetector CT imaging of the chest, abdomen and pelvis was
performed following the standard protocol during bolus
administration of intravenous contrast.
CONTRAST:  100mL OMNIPAQUE IOHEXOL 300 MG/ML  SOLN

[Series 7: coronal · coronal · 0.66mm/px · 2 of 124 slices shown, 3 images]
[im 42/124  soft-tissue]
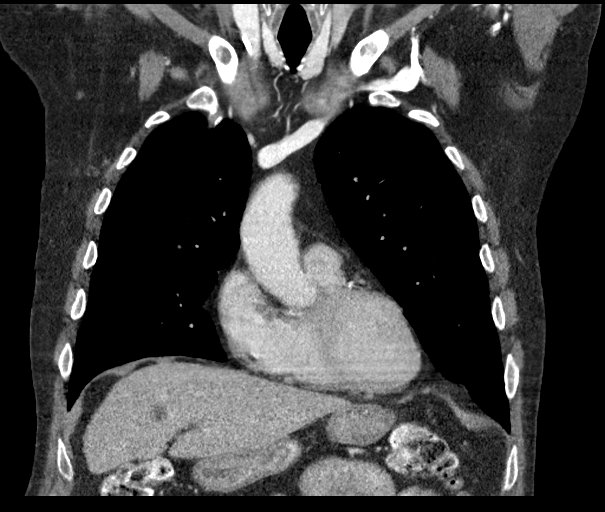
[im 42/124  bone]
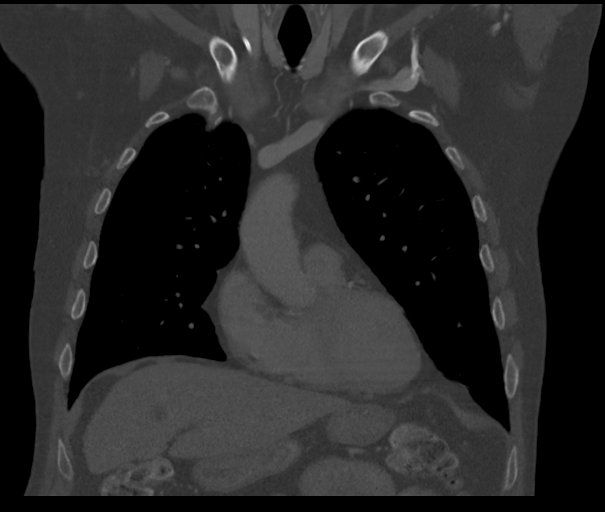
[im 83/124  soft-tissue]
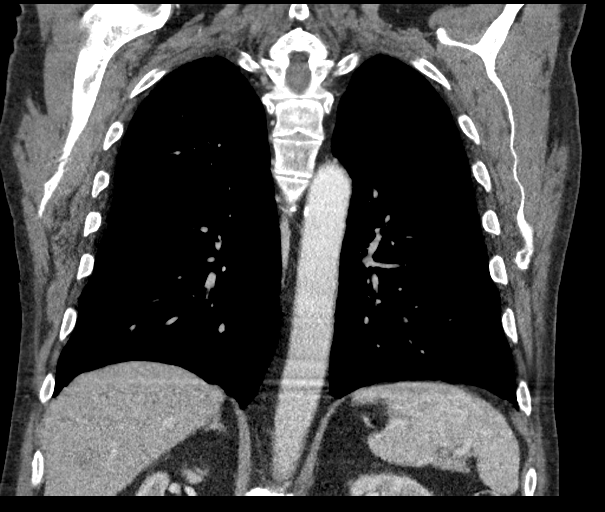

[Series 9: axial pre · axial · non-contrast · 0.91mm/px · z∈[-714,-359]mm · 5 of 107 slices shown, 10 images]
[im 18/107  soft-tissue]
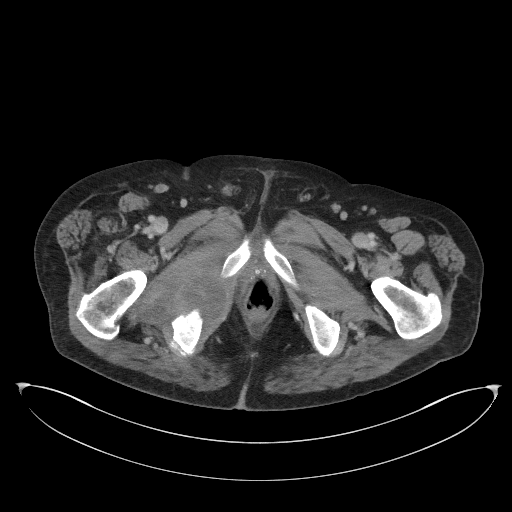
[im 18/107  bone]
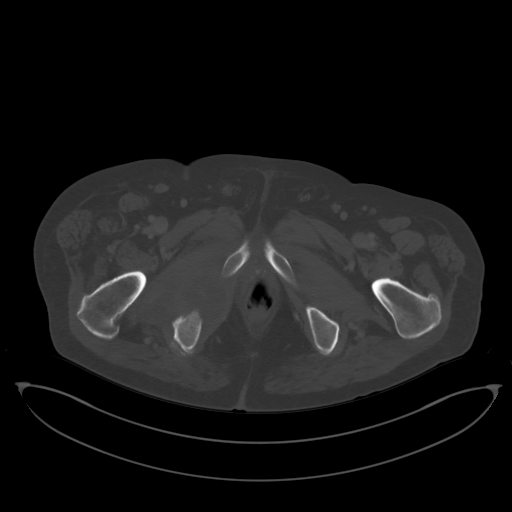
[im 36/107  soft-tissue]
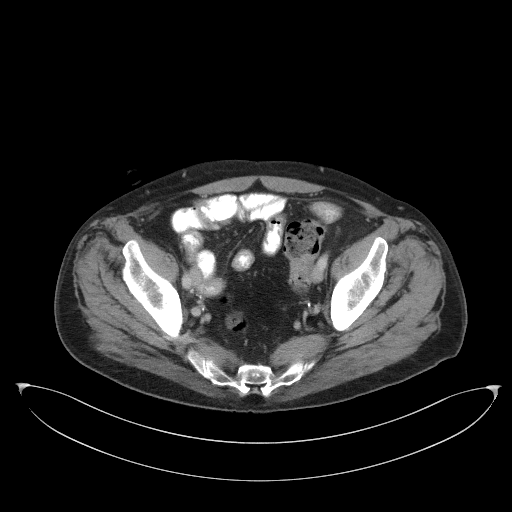
[im 36/107  lung]
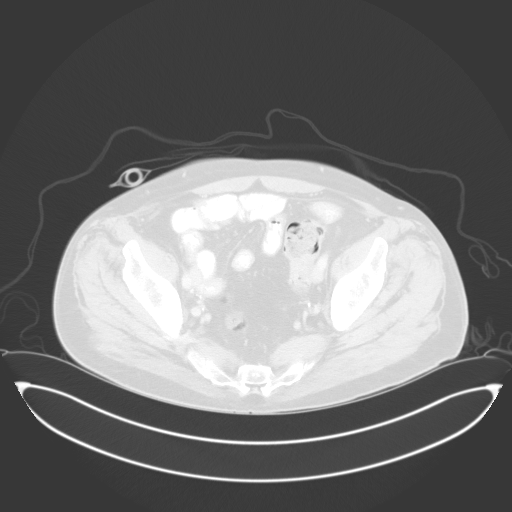
[im 54/107  soft-tissue]
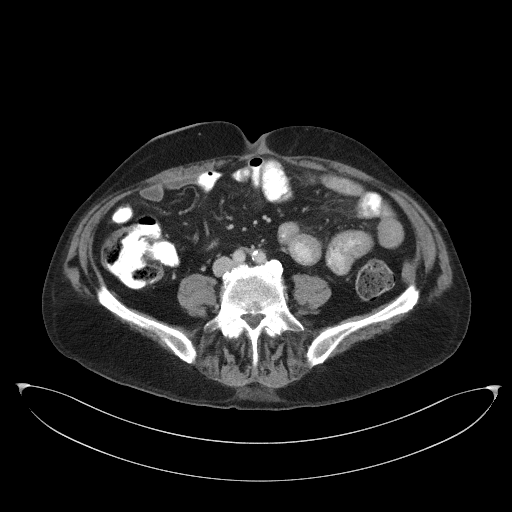
[im 54/107  lung]
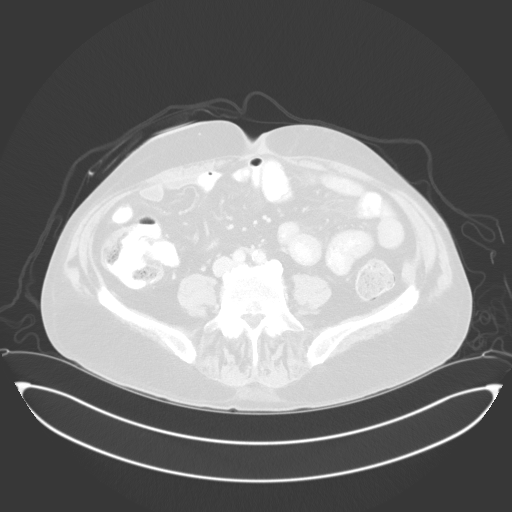
[im 71/107  soft-tissue]
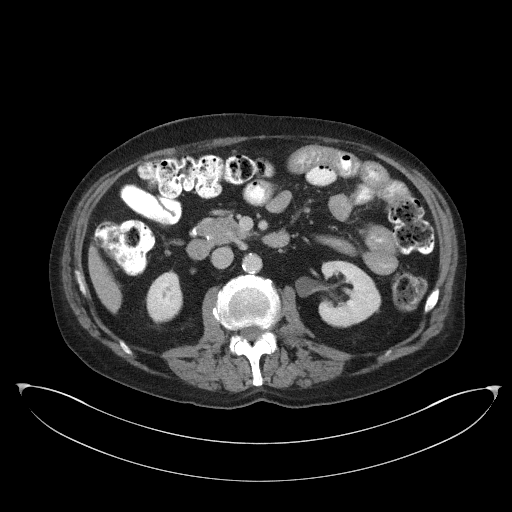
[im 71/107  lung]
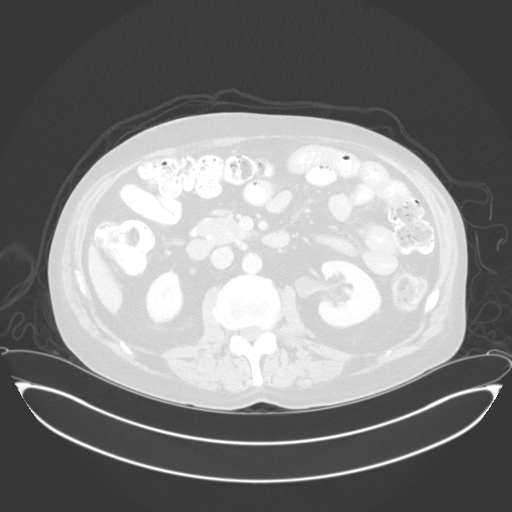
[im 89/107  soft-tissue]
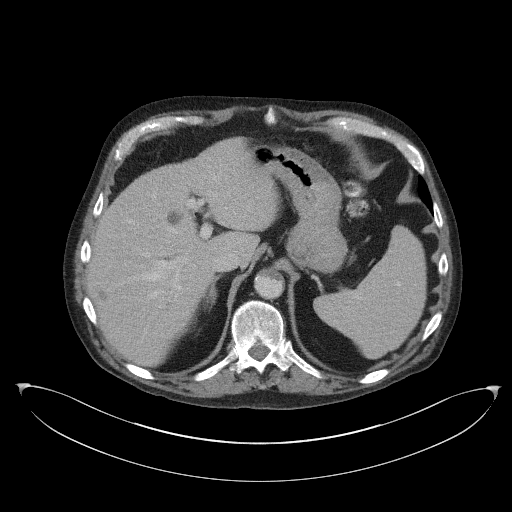
[im 89/107  lung]
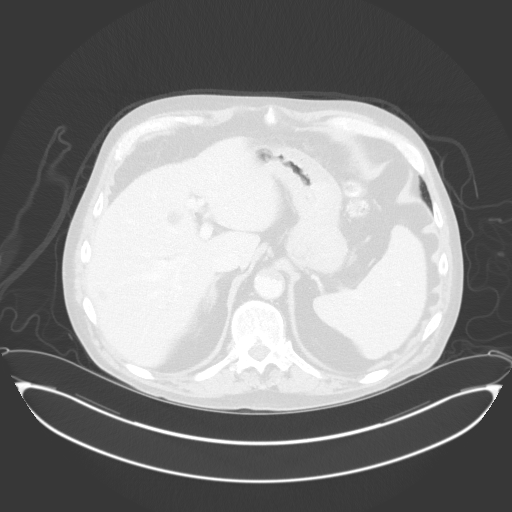

[Series 15: axial delay · axial · delayed · 0.91mm/px · z∈[-714,-404]mm · 4 of 104 slices shown]
[im 21/104  soft-tissue]
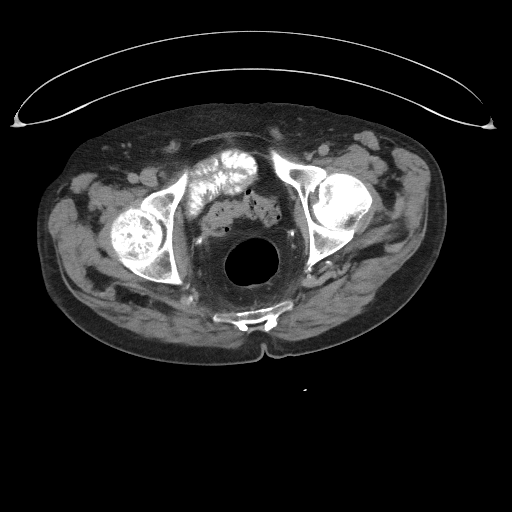
[im 42/104  soft-tissue]
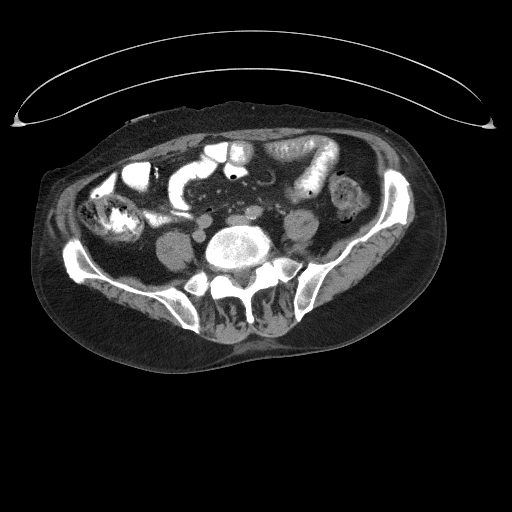
[im 62/104  soft-tissue]
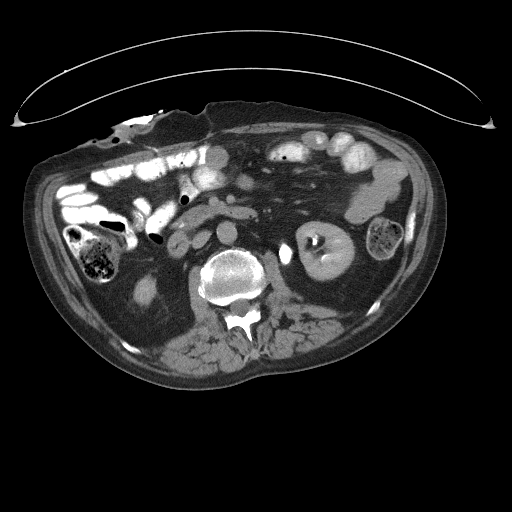
[im 83/104  soft-tissue]
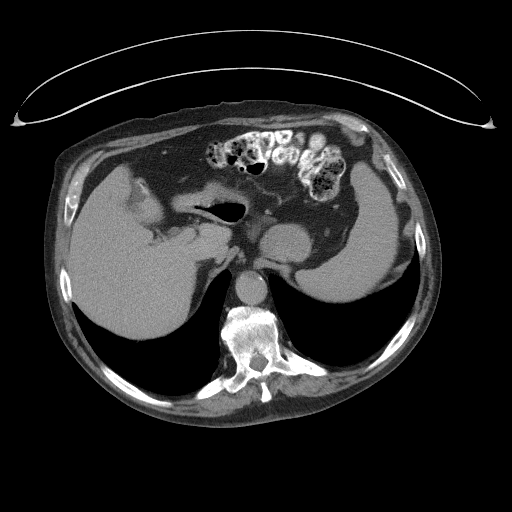

[11 of 46 positions shown; findings below may reference images not displayed]

FINDINGS: CT CHEST FINDINGS

Cardiovascular: A right Port-A-Cath which terminates at the low SVC.
Aortic and branch vessel atherosclerosis. Normal heart size, without
pericardial effusion. Multivessel coronary artery atherosclerosis.

Mediastinum/Nodes: No supraclavicular adenopathy. No mediastinal or
hilar adenopathy. Tiny hiatal hernia.

Lungs/Pleura: No pleural fluid. Focal right-sided pleural thickening
medially is similar.

Moderate centrilobular emphysema with lower lobe predominant
bronchial wall thickening.

Bilateral pulmonary nodules. Index left upper lobe pulmonary nodule
measures 12 x 12 mm on 41/6 and is unchanged.

Index anterior right upper lobe pulmonary nodule measures 8 x 5 mm
on 63/6 and is slightly decreased from 9 x 8 mm on the prior.

The remainder of the smaller pulmonary nodules are similar.

Musculoskeletal: No acute osseous abnormality.

CT ABDOMEN PELVIS FINDINGS

Hepatobiliary: Bilateral hepatic cysts. Normal gallbladder, without
biliary ductal dilatation.

Pancreas: Pancreatic parenchymal versus vascular calcifications
adjacent the common duct within the head. No duct dilatation or
acute inflammation.

Spleen: Normal in size, without focal abnormality.

Adrenals/Urinary Tract: Normal adrenal glands. Favor early contrast
excretion in the left kidney, given absence of stones on prior
noncontrast exam. Too small to characterize lesions in both kidneys.
Good renal collecting system opacification on delayed images.
Suboptimal proximal to mid right ureteric opacification on delayed
images. No ureteric filling defect identified. Cystectomy and ileal
conduit creation, without acute complication.

Stomach/Bowel: Normal remainder of the stomach. Otherwise normal
large and small bowel.

Vascular/Lymphatic: Aortic and branch vessel atherosclerosis.
Multiple renal arteries bilaterally. No abdominopelvic adenopathy.

Reproductive: Prostatectomy.

Other: No significant free fluid.

Musculoskeletal: Right inferior pubic ramus lytic lesion with soft
tissue component. Measures on the order of 5.2 x 5.1 cm on 92/9.
Compare 4.0 by 3.5 cm on the prior exam (when remeasured).
IMPRESSION: 1. Redemonstration of bilateral pulmonary metastasis. The majority
of nodules are similar in size. One index nodule has minimally
decreased.
2. Progression of right inferior pubic ramus osseous metastasis.
3. No new sites of soft tissue metastasis identified.
4. Cystoprostatectomy, as before.
5. Aortic atherosclerosis (G79QH-KJ5.5), coronary artery
atherosclerosis and emphysema (G79QH-FBD.9).

## 2019-06-26 LAB — URINE CULTURE: Culture: 40000 — AB

## 2019-06-27 ENCOUNTER — Inpatient Hospital Stay: Payer: Medicare Other | Attending: Oncology | Admitting: Oncology

## 2019-06-27 ENCOUNTER — Telehealth: Payer: Self-pay | Admitting: Emergency Medicine

## 2019-06-27 ENCOUNTER — Other Ambulatory Visit: Payer: Self-pay

## 2019-06-27 VITALS — BP 140/80 | HR 92 | Temp 98.3°F | Resp 18 | Ht 71.0 in | Wt 206.6 lb

## 2019-06-27 DIAGNOSIS — M25559 Pain in unspecified hip: Secondary | ICD-10-CM | POA: Insufficient documentation

## 2019-06-27 DIAGNOSIS — N400 Enlarged prostate without lower urinary tract symptoms: Secondary | ICD-10-CM | POA: Diagnosis not present

## 2019-06-27 DIAGNOSIS — Z9221 Personal history of antineoplastic chemotherapy: Secondary | ICD-10-CM | POA: Insufficient documentation

## 2019-06-27 DIAGNOSIS — R319 Hematuria, unspecified: Secondary | ICD-10-CM

## 2019-06-27 DIAGNOSIS — Z87891 Personal history of nicotine dependence: Secondary | ICD-10-CM | POA: Diagnosis not present

## 2019-06-27 DIAGNOSIS — C7951 Secondary malignant neoplasm of bone: Secondary | ICD-10-CM | POA: Insufficient documentation

## 2019-06-27 DIAGNOSIS — Z923 Personal history of irradiation: Secondary | ICD-10-CM | POA: Diagnosis not present

## 2019-06-27 DIAGNOSIS — R109 Unspecified abdominal pain: Secondary | ICD-10-CM | POA: Insufficient documentation

## 2019-06-27 DIAGNOSIS — N390 Urinary tract infection, site not specified: Secondary | ICD-10-CM | POA: Diagnosis not present

## 2019-06-27 DIAGNOSIS — C679 Malignant neoplasm of bladder, unspecified: Secondary | ICD-10-CM | POA: Diagnosis not present

## 2019-06-27 DIAGNOSIS — L299 Pruritus, unspecified: Secondary | ICD-10-CM | POA: Insufficient documentation

## 2019-06-27 NOTE — Telephone Encounter (Signed)
Post ED Visit - Positive Culture Follow-up: Successful Patient Follow-Up  Culture assessed and recommendations reviewed by:  []  Elenor Quinones, Pharm.D. []  Heide Guile, Pharm.D., BCPS AQ-ID []  Parks Neptune, Pharm.D., BCPS []  Alycia Rossetti, Pharm.D., BCPS []  [place], Pharm.D., BCPS, AAHIVP []  Legrand Como, Pharm.D., BCPS, AAHIVP []  Salome Arnt, PharmD, BCPS []  Johnnette Gourd, PharmD, BCPS []  Hughes Better, PharmD, BCPS []  Leeroy Cha, PharmD  Positive urine culture  []  Patient discharged without antimicrobial prescription and treatment is now indicated [x]  Organism is resistant to prescribed ED discharge antimicrobial []  Patient with positive blood cultures  Changes discussed with ED provider: [name] PA New antibiotic prescription stope Cephalexin, start bactrim DS 1 po bid x 7 days Called to [name] @  [phone]  Contacted patient   [name] 06/27/2019, 1:55 PM

## 2019-06-27 NOTE — Progress Notes (Signed)
Hematology and Oncology Follow Up Visit  [name] [id number] 1940/11/20 78 y.o. 06/27/2019 3:06 PM [name], MDFusco, Purcell Nails, MD   Principle Diagnosis: 78 year old man with stage IV bladder cancer with pelvic bone involvement diagnosed in 2019 with his cancer PDL 1 positive  with CPS score over 90%.  He was initially diagnosed with localized cancer in 2018.   Prior Therapy:   He is status post neoadjuvant chemotherapy utilizing gemcitabine and cisplatin started in September 2018.  He received day 1 of cycle 1 on 05/02/2017 and therapy discontinued after that.  He is status post laparoscopic Cystoprostatectomy and bilateral lymphadenectomy done by Dr. Tresa Moore on 06/29/2017. Final pathology showed a T3bN0 disease.  He has 13 lymph nodes sampled without any evidence of malignancy.  He completed radiation therapy to the pelvis on January 16, 2018 under the care of Dr. Tammi Klippel.   Carboplatin and gemcitabine he is status post 6 cycles of therapy completed on 05/22/2018.  Pembrolizumab 200 mg every 3 weeks started on 06/21/2018.  He completed 8 cycles of therapy and April 2020.  Therapy discontinued because of dermatological toxicities.   Current therapy: Active surveillance.  Interim History: [name] is here for a follow-up.  Since the last visit, he was seen in the emergency department for recurrent urinary tract infection and was started on cephalexin and switch to Bactrim because of sensitivities.  He reports feeling reasonably better at this time with improvement in his flank pain without any hematuria or fevers.  He remains reasonably active and attends to activities of daily living.  Continues to require higher doses of Dilaudid twice a day to control his pain.  He denies any pathological fractures or falls.  Still ambulates reasonably well and plays golf occasionally.   Patient denied any alteration mental status, neuropathy, confusion or dizziness.  Denies any headaches or  lethargy.  Denies any night sweats, weight loss or changes in appetite.  Denied orthopnea, dyspnea on exertion or chest discomfort.  Denies shortness of breath, difficulty breathing hemoptysis or cough.  Denies any abdominal distention, nausea, early satiety or dyspepsia.  Denies any hematuria, frequency, dysuria or nocturia.  Denies any skin irritation, dryness or rash.  Denies any ecchymosis or petechiae.  Denies any lymphadenopathy or clotting.  Denies any heat or cold intolerance.  Denies any anxiety or depression.  Remaining review of system is negative.                .      Medications: Without any changes on review. Current Outpatient Medications  Medication Sig Dispense Refill  . acetaminophen (TYLENOL) 500 MG tablet Take 1,000 mg by mouth every 6 (six) hours as needed for fever.     . cephALEXin (KEFLEX) 500 MG capsule Take 1 capsule (500 mg total) by mouth 4 (four) times daily. 20 capsule 0  . diphenhydrAMINE (BENADRYL) 25 MG tablet Take 25 mg by mouth every 6 (six) hours as needed for itching.    . famotidine (PEPCID) 40 MG tablet Take 1 tablet (40 mg total) by mouth daily. 30 tablet 11  . HYDROmorphone (DILAUDID) 4 MG tablet Take 1 tablet (4 mg total) by mouth every 4 (four) hours as needed for severe pain. 120 tablet 0  . lactose free nutrition (BOOST) LIQD Take 237 mLs by mouth 3 (three) times daily between meals.    . methylPREDNISolone (MEDROL DOSEPAK) 4 MG TBPK tablet 6 x 1 day, 5 x 1 day, 4 x 1 day, 3 x 1  day, 2 x 1 day, 1 x 1 day 21 tablet 0  . oxyCODONE (OXY IR/ROXICODONE) 5 MG immediate release tablet Take 1-2 tablets (5-10 mg total) by mouth every 4 (four) hours as needed for severe pain. 120 tablet 0   No current facility-administered medications for this visit.      Allergies:  Allergies  Allergen Reactions  . Ciprofloxacin Hives  . Cefepime Rash    Past Medical History, Surgical history, Social history, and Family History updated without any  changes.  Physical Exam: Blood pressure 140/80, pulse 92, temperature 98.3 F (36.8 C), temperature source Temporal, resp. rate 18, height 5\' 11"  (1.803 m), weight 206 lb 9.6 oz (93.7 kg), SpO2 98 %.    ECOG: 1   General appearance: Comfortable appearing without any discomfort Head: Normocephalic without any trauma Oropharynx: Mucous membranes are moist and pink without any thrush or ulcers. Eyes: Pupils are equal and round reactive to light. Lymph nodes: No cervical, supraclavicular, inguinal or axillary lymphadenopathy.   Heart:regular rate and rhythm.  S1 and S2 without leg edema. Lung: Clear without any rhonchi or wheezes.  No dullness to percussion. Abdomin: Soft, nontender, nondistended with good bowel sounds.  No hepatosplenomegaly. Musculoskeletal: No joint deformity or effusion.  Full range of motion noted. Neurological: No deficits noted on motor, sensory and deep tendon reflex exam. Skin: No petechial rash or dryness.  Appeared moist.                  Lab Results: Lab Results  Component Value Date   WBC 6.9 06/11/2019   HGB 14.3 06/11/2019   HCT 42.4 06/11/2019   MCV 106.0 (H) 06/11/2019   PLT 225 06/11/2019     Chemistry      Component Value Date/Time   NA 136 06/11/2019 1105   NA 140 04/30/2019 1033   NA 141 05/23/2017 0828   K 4.3 06/11/2019 1105   K 4.0 05/23/2017 0828   CL 106 06/11/2019 1105   CO2 18 (L) 06/11/2019 1105   CO2 27 05/23/2017 0828   BUN 17 06/11/2019 1105   BUN 16 04/30/2019 1033   BUN 12.5 05/23/2017 0828   CREATININE 1.04 06/11/2019 1105   CREATININE 0.8 05/23/2017 0828      Component Value Date/Time   CALCIUM 9.0 06/11/2019 1105   CALCIUM 9.0 05/23/2017 0828   ALKPHOS 57 06/11/2019 1105   ALKPHOS 57 05/23/2017 0828   AST 15 06/11/2019 1105   AST 13 05/23/2017 0828   ALT 28 06/11/2019 1105   ALT 15 05/23/2017 0828   BILITOT 0.3 06/11/2019 1105   BILITOT 0.37 05/23/2017 0828        Impression and  Plan:  78 year old man with:   1.  Bladder cancer diagnosed in 2018 and subsequently developed stage IV disease with pelvic bone and pulmonary involvement.  Imaging studies obtained in October 2020 showed overall stabilized disease without need for active treatment at this time.  Risks and benefits of restarting therapy utilizing immune therapy versus chemotherapy was discussed.  I have recommended the continued active surveillance at this time given his overall stable disease and to reinitiate salvage therapy if he developed progression of disease.  He is agreeable with this plan.   2. Hip pain: He continues to Dilaudid for severe pain.  3.  Goals of care: Therapy remains palliative at this time although aggressive measures are warranted given his reasonable performance status.  4.  Pruritus: Could be related to previous immune therapy.  5.  Follow-up: will be in 3 months for repeat imaging studies.   25  minutes was spent with the patient face-to-face today.  More than 50% of time was spent on reviewing the natural course of his disease, treatment options and management of complications related to therapy.  Zola Button, MD 11/11/20203:06 PM

## 2019-06-27 NOTE — Progress Notes (Signed)
ED Antimicrobial Stewardship Positive Culture Follow Up   [name] is an 78 y.o. male who presented to [facility] on 06/22/2019 with a chief complaint of  Chief Complaint  Patient presents with  . Back Pain    Recent Results (from the past 720 hour(s))  Culture, Blood     Status: None   Collection Time: 06/11/19 11:05 AM   Specimen: BLOOD RIGHT HAND  Result Value Ref Range Status   Specimen Description   Final    BLOOD RIGHT HAND Performed at [facility], [address], [place], [place] [address]    Special Requests   Final    BOTTLES DRAWN AEROBIC AND ANAEROBIC Blood Culture results may not be optimal due to an inadequate volume of blood received in culture bottles   Culture   Final    NO GROWTH 5 DAYS Performed at [facility], [name] [address], [place], [place] [address]    Report Status 06/16/2019 FINAL  Final  Culture, Blood     Status: None   Collection Time: 06/11/19 11:05 AM   Specimen: BLOOD LEFT HAND  Result Value Ref Range Status   Specimen Description   Final    BLOOD LEFT HAND Performed at [facility], [place] [address], [place], [place] [address]    Special Requests   Final    BOTTLES DRAWN AEROBIC AND ANAEROBIC Blood Culture results may not be optimal due to an inadequate volume of blood received in culture bottles   Culture   Final    NO GROWTH 5 DAYS Performed at [facility], [place] [address], [place], [place] [address]    Report Status 06/16/2019 FINAL  Final  Urine Culture     Status: Abnormal   Collection Time: 06/11/19 11:46 AM   Specimen: Urine, Catheterized  Result Value Ref Range Status   Specimen Description   Final    URINE, CATHETERIZED Performed at [facility], [place] [address], [place], [place] [address]    Special Requests   Final    NONE Performed at [facility], [place] [address] [place], [place] [address]    Culture  >=100,000 COLONIES/mL ENTEROCOCCUS FAECIUM (A)  Final   Report Status 06/13/2019 FINAL  Final   Organism ID, Bacteria ENTEROCOCCUS FAECIUM (A)  Final      Susceptibility   Enterococcus faecium - MIC*    AMPICILLIN >=32 RESISTANT Resistant     LEVOFLOXACIN >=8 RESISTANT Resistant     NITROFURANTOIN 128 RESISTANT Resistant     VANCOMYCIN 2 SENSITIVE Sensitive     GENTAMICIN SYNERGY SENSITIVE Sensitive     * >=100,000 COLONIES/mL ENTEROCOCCUS FAECIUM  Urine Culture     Status: Abnormal   Collection Time: 06/22/19 12:00 PM   Specimen: Urine, Clean Catch  Result Value Ref Range Status   Specimen Description   Final    URINE, CLEAN CATCH Performed at [facility], [address], [place],  [address]    Special Requests   Final    NONE Performed at [facility], [place] [address] [place], [place] [address]    Culture 40,000 COLONIES/mL SERRATIA MARCESCENS (A)  Final   Report Status 06/26/2019 FINAL  Final   Organism ID, Bacteria SERRATIA MARCESCENS (A)  Final      Susceptibility   Serratia marcescens - MIC*    CEFAZOLIN >=64 RESISTANT Resistant     CEFTRIAXONE <=1 SENSITIVE Sensitive  CIPROFLOXACIN <=0.25 SENSITIVE Sensitive     GENTAMICIN <=1 SENSITIVE Sensitive     NITROFURANTOIN 128 RESISTANT Resistant     TRIMETH/SULFA <=20 SENSITIVE Sensitive     * 40,000 COLONIES/mL SERRATIA MARCESCENS    [x]  Treated with cephalexin, organism resistant to prescribed antimicrobial  New antibiotic prescription: Bactrim DS PO BID x7 days  ED Provider: [name], PA-C   Kennon Holter, PharmD PGY1 Ambulatory Care Pharmacy Resident [employer] Phone: [phone] 06/27/2019, 9:17 AM

## 2019-06-28 ENCOUNTER — Telehealth: Payer: Self-pay | Admitting: Oncology

## 2019-06-28 NOTE — Telephone Encounter (Signed)
Scheduled appt per 11/11 los. ° °Spoke with pt and he is aware of his appt date and time. °

## 2019-06-29 ENCOUNTER — Ambulatory Visit: Payer: Medicare Other | Admitting: Infectious Diseases

## 2019-07-16 ENCOUNTER — Other Ambulatory Visit: Payer: Self-pay

## 2019-07-16 ENCOUNTER — Inpatient Hospital Stay: Payer: Medicare Other

## 2019-07-16 ENCOUNTER — Telehealth: Payer: Self-pay

## 2019-07-16 ENCOUNTER — Inpatient Hospital Stay (HOSPITAL_BASED_OUTPATIENT_CLINIC_OR_DEPARTMENT_OTHER): Payer: Medicare Other | Admitting: Medical

## 2019-07-16 ENCOUNTER — Encounter: Payer: Self-pay | Admitting: Gastroenterology

## 2019-07-16 VITALS — BP 147/88 | HR 100 | Temp 98.3°F | Resp 18 | Ht 71.0 in | Wt 207.9 lb

## 2019-07-16 DIAGNOSIS — N390 Urinary tract infection, site not specified: Secondary | ICD-10-CM

## 2019-07-16 DIAGNOSIS — L299 Pruritus, unspecified: Secondary | ICD-10-CM | POA: Diagnosis not present

## 2019-07-16 DIAGNOSIS — R319 Hematuria, unspecified: Secondary | ICD-10-CM | POA: Diagnosis not present

## 2019-07-16 DIAGNOSIS — C7951 Secondary malignant neoplasm of bone: Secondary | ICD-10-CM | POA: Diagnosis not present

## 2019-07-16 DIAGNOSIS — R109 Unspecified abdominal pain: Secondary | ICD-10-CM | POA: Diagnosis not present

## 2019-07-16 DIAGNOSIS — C679 Malignant neoplasm of bladder, unspecified: Secondary | ICD-10-CM

## 2019-07-16 DIAGNOSIS — Z923 Personal history of irradiation: Secondary | ICD-10-CM | POA: Diagnosis not present

## 2019-07-16 DIAGNOSIS — M25559 Pain in unspecified hip: Secondary | ICD-10-CM | POA: Diagnosis not present

## 2019-07-16 DIAGNOSIS — Z9221 Personal history of antineoplastic chemotherapy: Secondary | ICD-10-CM | POA: Diagnosis not present

## 2019-07-16 LAB — CBC WITH DIFFERENTIAL (CANCER CENTER ONLY)
Abs Immature Granulocytes: 0.03 10*3/uL (ref 0.00–0.07)
Basophils Absolute: 0 10*3/uL (ref 0.0–0.1)
Basophils Relative: 0 %
Eosinophils Absolute: 0 10*3/uL (ref 0.0–0.5)
Eosinophils Relative: 0 %
HCT: 39.9 % (ref 39.0–52.0)
Hemoglobin: 13.4 g/dL (ref 13.0–17.0)
Immature Granulocytes: 0 %
Lymphocytes Relative: 5 %
Lymphs Abs: 0.4 10*3/uL — ABNORMAL LOW (ref 0.7–4.0)
MCH: 35.8 pg — ABNORMAL HIGH (ref 26.0–34.0)
MCHC: 33.6 g/dL (ref 30.0–36.0)
MCV: 106.7 fL — ABNORMAL HIGH (ref 80.0–100.0)
Monocytes Absolute: 0.5 10*3/uL (ref 0.1–1.0)
Monocytes Relative: 6 %
Neutro Abs: 6.5 10*3/uL (ref 1.7–7.7)
Neutrophils Relative %: 89 %
Platelet Count: 176 10*3/uL (ref 150–400)
RBC: 3.74 MIL/uL — ABNORMAL LOW (ref 4.22–5.81)
RDW: 13.1 % (ref 11.5–15.5)
WBC Count: 7.3 10*3/uL (ref 4.0–10.5)
nRBC: 0 % (ref 0.0–0.2)

## 2019-07-16 LAB — CMP (CANCER CENTER ONLY)
ALT: 23 U/L (ref 0–44)
AST: 17 U/L (ref 15–41)
Albumin: 2.7 g/dL — ABNORMAL LOW (ref 3.5–5.0)
Alkaline Phosphatase: 51 U/L (ref 38–126)
Anion gap: 12 (ref 5–15)
BUN: 15 mg/dL (ref 8–23)
CO2: 23 mmol/L (ref 22–32)
Calcium: 8.9 mg/dL (ref 8.9–10.3)
Chloride: 103 mmol/L (ref 98–111)
Creatinine: 1.13 mg/dL (ref 0.61–1.24)
GFR, Est AFR Am: >60 mL/min (ref >60)
GFR, Estimated: >60 mL/min (ref >60)
Glucose, Bld: 169 mg/dL — ABNORMAL HIGH (ref 70–99)
Potassium: 4.2 mmol/L (ref 3.5–5.1)
Sodium: 138 mmol/L (ref 135–145)
Total Bilirubin: 0.3 mg/dL (ref 0.3–1.2)
Total Protein: 6 g/dL — ABNORMAL LOW (ref 6.5–8.1)

## 2019-07-16 MED ORDER — SULFAMETHOXAZOLE-TRIMETHOPRIM 800-160 MG PO TABS: 1.0000 | ORAL_TABLET | Freq: Two times a day (BID) | ORAL | 0 refills | Status: DC

## 2019-07-16 MED ORDER — SULFAMETHOXAZOLE-TRIMETHOPRIM 400-80 MG PO TABS: 1.0000 | ORAL_TABLET | Freq: Every day | ORAL | 0 refills | Status: DC

## 2019-07-16 MED ORDER — METHYLPREDNISOLONE 4 MG PO TBPK: ORAL_TABLET | ORAL | 0 refills | Status: DC

## 2019-07-16 NOTE — Patient Instructions (Signed)
COVID-19: How to Protect Yourself and Others Know how it spreads  There is currently no vaccine to prevent coronavirus disease 2019 (COVID-19).  The best way to prevent illness is to avoid being exposed to this virus.  The virus is thought to spread mainly from person-to-person. ? Between people who are in close contact with one another (within about 6 feet). ? Through respiratory droplets produced when an infected person coughs, sneezes or talks. ? These droplets can land in the mouths or noses of people who are nearby or possibly be inhaled into the lungs. ? Some recent studies have suggested that COVID-19 may be spread by people who are not showing symptoms. Everyone should Clean your hands often  Wash your hands often with soap and water for at least 20 seconds especially after you have been in a public place, or after blowing your nose, coughing, or sneezing.  If soap and water are not readily available, use a hand sanitizer that contains at least 60% alcohol. Cover all surfaces of your hands and rub them together until they feel dry.  Avoid touching your eyes, nose, and mouth with unwashed hands. Avoid close contact  Stay home if you are sick.  Avoid close contact with people who are sick.  Put distance between yourself and other people. ? Remember that some people without symptoms may be able to spread virus. ? This is especially important for people who are at higher risk of getting very sick.www.cdc.gov/coronavirus/2019-ncov/need-extra-precautions/people-at-higher-risk.html Cover your mouth and nose with a cloth face cover when around others  You could spread COVID-19 to others even if you do not feel sick.  Everyone should wear a cloth face cover when they have to go out in public, for example to the grocery store or to pick up other necessities. ? Cloth face coverings should not be placed on young children under age 2, anyone who has trouble breathing, or is unconscious,  incapacitated or otherwise unable to remove the mask without assistance.  The cloth face cover is meant to protect other people in case you are infected.  Do NOT use a facemask meant for a healthcare worker.  Continue to keep about 6 feet between yourself and others. The cloth face cover is not a substitute for social distancing. Cover coughs and sneezes  If you are in a private setting and do not have on your cloth face covering, remember to always cover your mouth and nose with a tissue when you cough or sneeze or use the inside of your elbow.  Throw used tissues in the trash.  Immediately wash your hands with soap and water for at least 20 seconds. If soap and water are not readily available, clean your hands with a hand sanitizer that contains at least 60% alcohol. Clean and disinfect  Clean AND disinfect frequently touched surfaces daily. This includes tables, doorknobs, light switches, countertops, handles, desks, phones, keyboards, toilets, faucets, and sinks. www.cdc.gov/coronavirus/2019-ncov/prevent-getting-sick/disinfecting-your-home.html  If surfaces are dirty, clean them: Use detergent or soap and water prior to disinfection.  Then, use a household disinfectant. You can see a list of EPA-registered household disinfectants here. cdc.gov/coronavirus 12/19/2018 This information is not intended to replace advice given to you by your health care provider. Make sure you discuss any questions you have with your health care provider. Document Released: 11/28/2018 Document Revised: 12/27/2018 Document Reviewed: 11/28/2018 Elsevier Patient Education  2020 [employer].  

## 2019-07-16 NOTE — Telephone Encounter (Signed)
Returned call to patient and he stated he can arrive by 11am to be seen in Symptom Management.

## 2019-07-16 NOTE — Progress Notes (Signed)
Symptoms Management Clinic Progress Note   [name] [id number] April 13, 1941 78 y.o.  [name] is managed by Dr. Zola Button  Actively treated with chemotherapy/immunotherapy/hormonal therapy: active surveillance  Last treated: April 2020  Next scheduled appointment with provider: 10/14/2019  Assessment: Plan:    Urinary tract infection with hematuria, site unspecified  Malignant neoplasm of urinary bladder, unspecified site ([place])  Bone metastasis ([place])   Recurrent urinary tract infections: [name] started a prescription for Bactrim this past weekend with pills that were leftover from a prior treatment.  He was given an additional 7 days of Bactrim 800-160 mg p.o. twice daily and was told to begin Bactrim 400-80 mg once daily for suppression of possible recurrent urinary tract infections.  He was given a 30-day supply as a trial.  Metastatic bladder cancer with bone metastasis: The patient continues to be followed by Dr. Alen Blew and is currently under active surveillance.  He will be seen in follow-up on 10/14/2019.  Please see After Visit Summary for patient specific instructions.  Future Appointments  Date Time Provider [place]  07/27/2019 10:00 AM [facility] NURSE [facility] None  07/27/2019 10:30 AM Valentina Shaggy, MD [facility] None  09/27/2019  8:00 AM [facility] None  10/04/2019  9:30 AM Shadad, Mathis Dad, MD [facility] None    No orders of the defined types were placed in this encounter.      Subjective:   Patient ID:  [name] is a 78 y.o. (DOB May 27, 1941) male.  Chief Complaint:  Chief Complaint  Patient presents with  . Urinary Tract Infection    HPI [name]  is a 78 y.o. male with a diagnosis of metastatic bladder cancer.  He is followed by Dr. Alen Blew and is currently managed with active surveillance.  He was last treated with chemotherapy in April 2020.  He is scheduled to see Dr.  Alen Blew in follow-up on 10/07/2019.  He has a history of a urostomy and has had recurrent urinary tract infections.  His last urinary tract infection was approximately 2 weeks ago when he was seen at the [facility] emergency room.  He was doing better until this past weekend when he developed dark and cloudy foul-smelling urine.  He had a leftover portion of a prescription of Bactrim which he started.  His urine is now clear.  He denies fevers, chills, or sweats.  He inquires about suppressive therapy has he has had multiple urinary tract infections.  Medications: I have reviewed the patient's current medications.  Allergies:  Allergies  Allergen Reactions  . Ciprofloxacin Hives  . Cefepime Rash    Past Medical History:  Diagnosis Date  . Bladder cancer ([place])   . BPH (benign prostatic hypertrophy)   . Dysuria   . GERD (gastroesophageal reflux disease)   . History of adenomatous polyp of colon    tubular adenoma 06/ 2018  . History of bladder cancer 12/2014   urologist-  dr [name]--  dx High Grade TCC without stromal invasion s/p TURBT and intravesical BCG tx/  recurrent 07/2015 (pTa) TCC  repear intravesical BCG tx   . History of hiatal hernia   . History of urethral stricture   . Nocturia     Past Surgical History:  Procedure Laterality Date  . CARDIAC CATHETERIZATION  1997   normal coronary arteries  . CARDIOVASCULAR STRESS TEST  12-14-2005   Low risk perfusion study/  very small scar in the inferior septum from  mid ventricle to apex,  no ischemia/  mild inferior septal hypokinesis/  ef 58%  . CATARACT EXTRACTION W/ INTRAOCULAR LENS  IMPLANT, BILATERAL  2011  . COLONOSCOPY  last one 06/ 2018  . CYSTOSCOPY WITH BIOPSY N/A 08/12/2015   Procedure: CYSTOSCOPY WITH BIOPSY AND FULGERATION;  Surgeon: [name], MD;  [facility];  Service: Urology;  Laterality: N/A;  . CYSTOSCOPY WITH INJECTION N/A 06/29/2017   Procedure: CYSTOSCOPY WITH INJECTION OF  INDOCYANINE GREEN DYE;  Surgeon: Alexis Frock, MD;  Location: [facility];  Service: Urology;  Laterality: N/A;  . CYSTOSCOPY WITH URETHRAL DILATATION N/A 03/21/2017   Procedure: CYSTOSCOPY;  Surgeon: [name], MD;  Location[facility];  Service: Urology;  Laterality: N/A;  . ESOPHAGOGASTRODUODENOSCOPY  12-05-2014  . IR IMAGING GUIDED PORT INSERTION  02/07/2018  . IR REMOVAL TUN ACCESS W/ PORT W/O FL MOD SED  04/14/2018  . TRANSTHORACIC ECHOCARDIOGRAM  01-31-2008   nomral LVF,  ef 55-65%/  mild pulmonic stenosis without regurg.,  peak transpulomonic valve gradient 48mmHg  . TRANSURETHRAL RESECTION OF BLADDER TUMOR N/A 12/31/2014   Procedure: TRANSURETHRAL RESECTION OF BLADDER TUMOR (TURBT);  Surgeon: Lowella Bandy, MD;  Location: [facility];  Service: Urology;  Laterality: N/A;  . TRANSURETHRAL RESECTION OF BLADDER TUMOR N/A 03/21/2017   Procedure: POSSIBLE TRANSURETHRAL RESECTION OF BLADDER TUMOR (TURBT);  Surgeon: [name], MD;  Location: [facility];  Service: Urology;  Laterality: N/A;    Family History  Problem Relation Age of Onset  . Alcoholism Father   . Colon cancer Neg Hx   . Colon polyps Neg Hx   . Diabetes Neg Hx   . Kidney disease Neg Hx   . Esophageal cancer Neg Hx   . Heart disease Neg Hx   . Gallbladder disease Neg Hx     Social History   Socioeconomic History  . Marital status: Married    Spouse name: Not on file  . Number of children: 1  . Years of education: Not on file  . Highest education level: Not on file  Occupational History  . Occupation: Retired  Scientific laboratory technician  . Financial resource strain: Not on file  . Food insecurity    Worry: Not on file    Inability: Not on file  . Transportation needs    Medical: Not on file    Non-medical: Not on file  Tobacco Use  . Smoking status: Former Smoker    Packs/day: 1.00    Years: 34.00    Pack years: 34.00    Types: Cigarettes    Quit date: 12/26/1988     Years since quitting: 30.5  . Smokeless tobacco: Never Used  Substance and Sexual Activity  . Alcohol use: Yes    Alcohol/week: 6.0 standard drinks    Types: 6 Cans of beer per week    Comment: BEER  . Drug use: No  . Sexual activity: Never  Lifestyle  . Physical activity    Days per week: Not on file    Minutes per session: Not on file  . Stress: Not on file  Relationships  . Social Herbalist on phone: Not on file    Gets together: Not on file    Attends religious service: Not on file    Active member of club or organization: Not on file    Attends meetings of clubs or organizations: Not on file    Relationship  status: Not on file  . Intimate partner violence    Fear of current or ex partner: Not on file    Emotionally abused: Not on file    Physically abused: Not on file    Forced sexual activity: Not on file  Other Topics Concern  . Not on file  Social History Narrative  . Not on file    Past Medical History, Surgical history, Social history, and Family history were reviewed and updated as appropriate.   Please see review of systems for further details on the patient's review from today.   Review of Systems:  Review of Systems  Constitutional: Negative for chills, diaphoresis and fever.  Respiratory: Negative for cough and shortness of breath.   Cardiovascular: Negative for chest pain, palpitations and leg swelling.  Gastrointestinal: Negative for abdominal pain, constipation, diarrhea, nausea and vomiting.  Genitourinary: Negative for difficulty urinating, dysuria, flank pain, frequency, hematuria and urgency.       Cloudy, dark, foul-smelling urine output to a urostomy bag.  Skin: Negative for rash.  Neurological: Negative for dizziness and headaches.    Objective:   Physical Exam:  BP (!) 147/88 (BP Location: Left Arm, Patient Position: Sitting)   Pulse 100   Temp 98.3 F (36.8 C)   Resp 18   Ht 5\' 11"  (1.803 m)   Wt 207 lb 14.4 oz (94.3  kg)   SpO2 97%   BMI 29.00 kg/m  ECOG: 1  Physical Exam Constitutional:      General: He is not in acute distress.    Appearance: He is not diaphoretic.  HENT:     Head: Normocephalic and atraumatic.  Eyes:     General: No scleral icterus.       Right eye: No discharge.        Left eye: No discharge.  Cardiovascular:     Rate and Rhythm: Normal rate and regular rhythm.     Heart sounds: Normal heart sounds. No murmur. No friction rub. No gallop.   Pulmonary:     Effort: Pulmonary effort is normal. No respiratory distress.     Breath sounds: Normal breath sounds. No wheezing or rales.  Abdominal:     Comments: Patient is noted to have a urostomy bag with clear yellow urine.  Skin:    General: Skin is warm and dry.     Findings: No erythema or rash.  Neurological:     Mental Status: He is alert.     Coordination: Coordination normal.     Gait: Gait normal.  Psychiatric:        Mood and Affect: Mood normal.        Behavior: Behavior normal.        Thought Content: Thought content normal.        Judgment: Judgment normal.     Lab Review:     Component Value Date/Time   NA 138 07/16/2019 1050   NA 140 04/30/2019 1033   NA 141 05/23/2017 0828   K 4.2 07/16/2019 1050   K 4.0 05/23/2017 0828   CL 103 07/16/2019 1050   CO2 23 07/16/2019 1050   CO2 27 05/23/2017 0828   GLUCOSE 169 (H) 07/16/2019 1050   GLUCOSE 97 05/23/2017 0828   BUN 15 07/16/2019 1050   BUN 16 04/30/2019 1033   BUN 12.5 05/23/2017 0828   CREATININE 1.13 07/16/2019 1050   CREATININE 0.8 05/23/2017 0828   CALCIUM 8.9 07/16/2019 1050   CALCIUM 9.0 05/23/2017 0828  PROT 6.0 (L) 07/16/2019 1050   PROT 5.7 (L) 04/30/2019 1033   PROT 5.6 (L) 05/23/2017 0828   ALBUMIN 2.7 (L) 07/16/2019 1050   ALBUMIN 3.7 04/30/2019 1033   ALBUMIN 2.5 (L) 05/23/2017 0828   AST 17 07/16/2019 1050   AST 13 05/23/2017 0828   ALT 23 07/16/2019 1050   ALT 15 05/23/2017 0828   ALKPHOS 51 07/16/2019 1050   ALKPHOS 57  05/23/2017 0828   BILITOT 0.3 07/16/2019 1050   BILITOT 0.37 05/23/2017 0828   GFRNONAA >60 07/16/2019 1050   GFRAA >60 07/16/2019 1050       Component Value Date/Time   WBC 7.3 07/16/2019 1050   WBC 13.6 (H) 03/09/2019 1116   RBC 3.74 (L) 07/16/2019 1050   HGB 13.4 07/16/2019 1050   HGB 12.7 (L) 05/23/2017 0828   HCT 39.9 07/16/2019 1050   HCT 37.3 (L) 05/23/2017 0828   PLT 176 07/16/2019 1050   PLT 298 05/23/2017 0828   MCV 106.7 (H) 07/16/2019 1050   MCV 104.2 (H) 05/23/2017 0828   MCH 35.8 (H) 07/16/2019 1050   MCHC 33.6 07/16/2019 1050   RDW 13.1 07/16/2019 1050   RDW 13.6 05/23/2017 0828   LYMPHSABS 0.4 (L) 07/16/2019 1050   LYMPHSABS 0.8 (L) 05/23/2017 0828   MONOABS 0.5 07/16/2019 1050   MONOABS 1.0 (H) 05/23/2017 0828   EOSABS 0.0 07/16/2019 1050   EOSABS 0.1 05/23/2017 0828   BASOSABS 0.0 07/16/2019 1050   BASOSABS 0.0 05/23/2017 0828   -------------------------------  Imaging from last 24 hours (if applicable):  Radiology interpretation: No results found.

## 2019-07-16 NOTE — Telephone Encounter (Signed)
-----   Message from [name], MD sent at 07/16/2019  9:17 AM EST ----- [facility] is fine. Thanks ----- Message ----- From: [name], RN Sent: 07/16/2019   8:57 AM EST To: [name], MD  Patient called to report he thinks he has a UTI. C/o flank pain, dark foul smelling urine and just not feeling well.  Patient stated you instructed him to come to the office for evaluation the next time this occurred. Do you want me to get him scheduled in Symptom Management.  [name]

## 2019-07-17 LAB — URINE CULTURE: Culture: NO GROWTH

## 2019-07-26 DIAGNOSIS — L501 Idiopathic urticaria: Secondary | ICD-10-CM | POA: Diagnosis not present

## 2019-07-27 ENCOUNTER — Ambulatory Visit: Payer: Self-pay

## 2019-07-27 ENCOUNTER — Ambulatory Visit (INDEPENDENT_AMBULATORY_CARE_PROVIDER_SITE_OTHER): Payer: Medicare Other | Admitting: Allergy & Immunology

## 2019-07-27 ENCOUNTER — Encounter: Payer: Self-pay | Admitting: Allergy & Immunology

## 2019-07-27 ENCOUNTER — Other Ambulatory Visit: Payer: Self-pay

## 2019-07-27 VITALS — BP 132/70 | HR 102 | Temp 97.6°F | Resp 16

## 2019-07-27 DIAGNOSIS — L501 Idiopathic urticaria: Secondary | ICD-10-CM

## 2019-07-27 MED ORDER — OMALIZUMAB 150 MG ~~LOC~~ SOLR
300.0000 mg | SUBCUTANEOUS | Status: DC
  Administered 2019-07-27 – 2019-09-21 (×3): 300 mg via SUBCUTANEOUS

## 2019-07-27 MED ORDER — EPINEPHRINE 0.3 MG/0.3ML IJ SOAJ: 0.3000 mg | Freq: Once | INTRAMUSCULAR | 1 refills | Status: AC

## 2019-07-27 NOTE — Progress Notes (Signed)
FOLLOW UP  Date of Service/Encounter:  07/27/19   Assessment:   Chronic urticaria with itching - initiating Xolair today  Metastatic bladder cancer - s/p treatment with gemcitabine and cisplatin, and pembrolizumab  Pain control with opioids   Plan/Recommendations:   1. Itching and chronic urticaria (hives) - Xolair started today, which hopefully will help with the hive outbreaks.  - Take the prednisone pack today if symptoms worsen over the weekend.  - In the meantime, start suppressive dosing of antihistamines:   - Morning: Zyrtec (cetirizine) 10mg  (one tablets) + Pepcid (famotidine) 20mg   - Evening: Zyrtec (cetirizine) 10mg  (one tablets) + Pepcid (famotidine) 20mg  - You can change this dosing at home, decreasing the dose as needed or increasing the dosing as needed.   2. Return in about 6 months (around 01/25/2020). This can be an in-person, a virtual Webex or a telephone follow up visit.   Subjective:   BENGY LESKO is a 78 y.o. male presenting today for follow up of  Chief Complaint  Patient presents with  . Urticaria    ADRIAN KUKUK has a history of the following: Patient Active Problem List   Diagnosis Date Noted  . Redness and swelling of thigh 06/05/2018  . Adverse drug reaction 06/05/2018  . Bacteremia associated with intravascular line ([place]) 04/20/2018  . Febrile neutropenia ([place]) 04/13/2018  . Gram positive sepsis ([place]) 04/13/2018  . SIRS (systemic inflammatory response syndrome) ([place]) 04/12/2018  . Bacteremia due to Gram-positive bacteria 04/12/2018  . Pancytopenia ([place]) 04/12/2018  . Status post chemotherapy   . UTI (urinary tract infection) 04/11/2018  . Port-A-Cath in place 02/20/2018  . Encounter for antineoplastic chemotherapy 02/17/2018  . Sepsis due to urinary tract infection ([place]) 01/25/2018  . Sepsis secondary to UTI ([place]) 01/25/2018  . Hyperglycemia   . Goals of care, counseling/discussion 01/18/2018  . Bone metastasis ([place])  12/29/2017  . 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 Bladder cancer ([place]) 06/28/2017  . Malignant neoplasm of urinary bladder ([place]) 04/20/2017  . COPD UNSPECIFIED 03/07/2008    History obtained from: chart review and patient.  Kado is a 78 y.o. male presenting for a follow up visit.  At the last visit, we obtain labs to look for serious causes of hives and swelling.  We started him on Zyrtec 10 mg plus Pepcid 20 mg in the morning and evening.  He did decide to start Xolair and received his first dose today.  He did have a slightly elevated IgE to alpha gal.  We recommended continued avoidance of red meats.  The remainder of his labs were completely negative, including an environmental panel.  Since last visit, he has mostly done well.  He continues to have some urticarial breakouts, although his history is difficult to piece together.  He has not the best historian.  He did call us in October requesting azithromycin for his hives.  We sent in prednisone instead.  He also made the decision to start Xolair, which was given today.  Today, he is requesting azithromycin again for his hives.  I told him that there was no reason for azithromycin since this is not a hive treatment.  We did provide him with a Dosepak of prednisone to use just in case. He does think that his opioids might be related to his urticaria, which we had discussed at the last visit.   He apparently was in the ED for an infection since the last visit. In November, he was admitted  with urosepsis. Urine culture grew out Serratia. He was discharged on Keflex.   Otherwise, there have been no changes to his past medical history, surgical history, family history, or social history.    Review of Systems  Constitutional: Negative.  Negative for fever, malaise/fatigue and weight loss.  HENT: Negative.  Negative for congestion, ear discharge and ear pain.   Eyes: Negative for pain, discharge and redness.  Respiratory: Negative  for cough, sputum production, shortness of breath and wheezing.   Cardiovascular: Negative.  Negative for chest pain and palpitations.  Gastrointestinal: Negative for abdominal pain, constipation, diarrhea, heartburn, nausea and vomiting.  Skin: Positive for itching and rash.  Neurological: Negative for dizziness and headaches.  Endo/Heme/Allergies: Negative for environmental allergies. Does not bruise/bleed easily.       Objective:   Blood pressure 132/70, pulse (!) 102, temperature 97.6 F (36.4 C), temperature source Temporal, resp. rate 16, SpO2 96 %. There is no height or weight on file to calculate BMI.   Physical Exam:  Physical Exam  Constitutional: He appears well-developed.  Very talkative male.  HENT:  Head: Normocephalic and atraumatic.  Right Ear: Tympanic membrane, external ear and ear canal normal.  Left Ear: Tympanic membrane and ear canal normal.  Nose: No mucosal edema, rhinorrhea, nasal deformity or septal deviation. No epistaxis. Right sinus exhibits no maxillary sinus tenderness and no frontal sinus tenderness. Left sinus exhibits no maxillary sinus tenderness and no frontal sinus tenderness.  Mouth/Throat: Uvula is midline and oropharynx is clear and moist. Mucous membranes are not pale and not dry.  Eyes: Pupils are equal, round, and reactive to light. Conjunctivae and EOM are normal. Right eye exhibits no chemosis and no discharge. Left eye exhibits no chemosis and no discharge. Right conjunctiva is not injected. Left conjunctiva is not injected.  Cardiovascular: Normal rate, regular rhythm and normal heart sounds.  Respiratory: Effort normal and breath sounds normal. No accessory muscle usage. No tachypnea. No respiratory distress. He has no wheezes. He has no rhonchi. He has no rales. He exhibits no tenderness.  Lymphadenopathy:    He has no cervical adenopathy.  Neurological: He is alert.  Skin: No abrasion, no petechiae and no rash noted. Rash is not  papular, not vesicular and not urticarial. No erythema. No pallor.  There are some excoriation marks present on the neck.   Psychiatric: He has a normal mood and affect.     Diagnostic studies: none     Salvatore Marvel, MD  [facility] [place] [facility] [place]

## 2019-07-27 NOTE — Progress Notes (Signed)
Immunotherapy   Patient Details  Name: [name] MRN: [id number] Date of Birth: 1941/05/31  07/27/2019  [name] started on Xolair today. Patient received 300 mg. Patient waited in an exam room for 30 minutes with no problems.  Frequency: every 4 weeks Epi-Pen: Epi pen was sent in. Consent signed and patient instructions given.   Herbie Drape 07/27/2019, 10:10 AM

## 2019-07-27 NOTE — Patient Instructions (Addendum)
1. Itching and chronic urticaria (hives) - Xolair started today, which hopefully will help with the hive outbreaks.  - Take the prednisone pack today if symptoms worsen over the weekend.  - In the meantime, start suppressive dosing of antihistamines:   - Morning: Zyrtec (cetirizine) 10mg  (one tablets) + Pepcid (famotidine) 20mg   - Evening: Zyrtec (cetirizine) 10mg  (one tablets) + Pepcid (famotidine) 20mg  - You can change this dosing at home, decreasing the dose as needed or increasing the dosing as needed.   2. Return in about 6 months (around 01/25/2020). This can be an in-person, a virtual Webex or a telephone follow up visit.   Please inform us of any Emergency Department visits, hospitalizations, or changes in symptoms. Call us before going to the ED for breathing or allergy symptoms since we might be able to fit you in for a sick visit. Feel free to contact us anytime with any questions, problems, or concerns.  It was a pleasure to see you again today!  Websites that have reliable patient information: 1. American Academy of Asthma, Allergy, and Immunology: www.aaaai.org 2. Food Allergy Research and Education (FARE): foodallergy.org 3. Mothers of Asthmatics: http://www.asthmacommunitynetwork.org 4. American College of Allergy, Asthma, and Immunology: www.acaai.org  "Like" [place] on Facebook and Instagram for our latest updates!      Make sure you are registered to vote! If you have moved or changed any of your contact information, you will need to get this updated before voting!  In some cases, you MAY be able to register to vote online: [email or web address]

## 2019-08-16 ENCOUNTER — Other Ambulatory Visit: Payer: Self-pay | Admitting: Medical

## 2019-08-16 DIAGNOSIS — I10 Essential (primary) hypertension: Secondary | ICD-10-CM | POA: Diagnosis not present

## 2019-08-16 DIAGNOSIS — C679 Malignant neoplasm of bladder, unspecified: Secondary | ICD-10-CM | POA: Diagnosis not present

## 2019-08-16 DIAGNOSIS — G894 Chronic pain syndrome: Secondary | ICD-10-CM | POA: Diagnosis not present

## 2019-08-16 DIAGNOSIS — F4542 Pain disorder with related psychological factors: Secondary | ICD-10-CM | POA: Diagnosis not present

## 2019-08-23 DIAGNOSIS — L501 Idiopathic urticaria: Secondary | ICD-10-CM | POA: Diagnosis not present

## 2019-08-24 ENCOUNTER — Ambulatory Visit (INDEPENDENT_AMBULATORY_CARE_PROVIDER_SITE_OTHER): Payer: Medicare Other

## 2019-08-24 DIAGNOSIS — L501 Idiopathic urticaria: Secondary | ICD-10-CM | POA: Diagnosis not present

## 2019-08-28 DIAGNOSIS — Z23 Encounter for immunization: Secondary | ICD-10-CM | POA: Diagnosis not present

## 2019-09-04 DIAGNOSIS — L738 Other specified follicular disorders: Secondary | ICD-10-CM | POA: Diagnosis not present

## 2019-09-05 ENCOUNTER — Other Ambulatory Visit: Payer: Self-pay | Admitting: *Deleted

## 2019-09-05 ENCOUNTER — Other Ambulatory Visit: Payer: Self-pay | Admitting: Oncology

## 2019-09-05 ENCOUNTER — Telehealth: Payer: Self-pay | Admitting: *Deleted

## 2019-09-05 MED ORDER — SULFAMETHOXAZOLE-TRIMETHOPRIM 400-80 MG PO TABS: 1.0000 | ORAL_TABLET | Freq: Every day | ORAL | 0 refills | Status: DC

## 2019-09-05 MED ORDER — HYDROMORPHONE HCL 4 MG PO TABS: 4.0000 mg | ORAL_TABLET | ORAL | 0 refills | Status: DC | PRN

## 2019-09-05 NOTE — Telephone Encounter (Signed)
[name] stopped by the cancer center to request a refill of hydromorphone and bactrim

## 2019-09-05 NOTE — Telephone Encounter (Signed)
Refills will be sent to his pharmacy to pick up.

## 2019-09-07 ENCOUNTER — Telehealth: Payer: Self-pay | Admitting: Emergency Medicine

## 2019-09-07 ENCOUNTER — Other Ambulatory Visit: Payer: Self-pay | Admitting: Medical

## 2019-09-07 MED ORDER — SULFAMETHOXAZOLE-TRIMETHOPRIM 400-80 MG PO TABS: 1.0000 | ORAL_TABLET | Freq: Every day | ORAL | 2 refills | Status: DC

## 2019-09-07 NOTE — Telephone Encounter (Signed)
Returning pt's VM stating 'you didn't send in the right prescription, this isn't what I take.  I need a refill of the stuff Lucianne Lei gave me the last time I saw him'.  Spoke with PA Lucianne Lei, confirmed that bactrim refill that was sent in to pt's pharmacy is the correct medication.  Wife answered, states she will let [name] know that the medication at the pharmacy is the correct one to take daily per PA Lucianne Lei, and that he may call back at any time w/any further questions or concerns.

## 2019-09-07 NOTE — Telephone Encounter (Signed)
Returning pt's VM requesting refill of abx for chronic UTIs prescribed by PA Lucianne Lei.  PA Lucianne Lei sending in prescription, pt instructed to f/u next week (Monday or Tuesday) if he doesn't see improvement in lower back soreness/fatigue or worsening or new symptoms (fever/chills/no drainage from urostomy).  Pt verbalized understanding.

## 2019-09-18 DIAGNOSIS — C44311 Basal cell carcinoma of skin of nose: Secondary | ICD-10-CM | POA: Diagnosis not present

## 2019-09-20 DIAGNOSIS — L501 Idiopathic urticaria: Secondary | ICD-10-CM | POA: Diagnosis not present

## 2019-09-21 ENCOUNTER — Ambulatory Visit (INDEPENDENT_AMBULATORY_CARE_PROVIDER_SITE_OTHER): Payer: Medicare Other

## 2019-09-21 ENCOUNTER — Other Ambulatory Visit: Payer: Self-pay

## 2019-09-21 DIAGNOSIS — L501 Idiopathic urticaria: Secondary | ICD-10-CM

## 2019-09-21 NOTE — Progress Notes (Signed)
Immunotherapy   Patient Details  Name: DENORRIS ORDUNO MRN: [id number] Date of Birth: 02-08-1941  09/21/2019  [name] came in to get Xolair injections and Dr. Ernst Bowler gave him 2 baby packs to help with his itching. Following schedule: Xolair Frequency: Every 28 days Epi-Pen: Yes  Consent signed and patient instructions given.   Isabel Caprice 09/21/2019, 2:58 PM

## 2019-09-26 ENCOUNTER — Ambulatory Visit (INDEPENDENT_AMBULATORY_CARE_PROVIDER_SITE_OTHER): Payer: Medicare Other | Admitting: Allergy & Immunology

## 2019-09-26 ENCOUNTER — Other Ambulatory Visit: Payer: Self-pay

## 2019-09-26 ENCOUNTER — Encounter: Payer: Self-pay | Admitting: Allergy & Immunology

## 2019-09-26 VITALS — BP 138/82 | HR 78 | Temp 98.1°F | Resp 18

## 2019-09-26 DIAGNOSIS — L299 Pruritus, unspecified: Secondary | ICD-10-CM

## 2019-09-26 DIAGNOSIS — L501 Idiopathic urticaria: Secondary | ICD-10-CM

## 2019-09-26 NOTE — Progress Notes (Signed)
FOLLOW UP  Date of Service/Encounter:  09/26/19   Assessment:   Chronic urticariawith itching - on monthly Xolair with some improvement  Metastatic bladder cancer - s/p treatment withgemcitabine and cisplatin, and pembrolizumab  Pain control with opioids   [name] presents for follow-up visit.  Unfortunately, he continues to have some itching despite the use of Xolair monthly.  I do not think he is using his cetirizine and Pepcid on a routine basis, as he tells me that this is not working.  We are going to change him to Allegra 1 tablet twice daily as well as famotidine 1 tablet twice daily.  Hopefully this will provide some relief while we get the Xolair approved for every 2-week administration.  He did go online about wanting prednisone, but we did have a long discussion about the side effects of prednisone including irritability, increased appetite, bone thinning, and immunosuppression.  I also would hate for the use of prednisone to interfere with his chemotherapy regimen, should he need to go back onto chemotherapy.  Plan/Recommendations:   1. Itching and chronic urticaria (hives) - Since he has gotten some improvement with his Xolair, we are going to increase it to every 2 weeks. - Prednisone is not a good long-term solution since it has so many bad side effects, including immunosuppression. - We are going to change her antihistamines to see if this provides any better relief. - In the meantime, start suppressive dosing of antihistamines:   - Morning: Allegra (fexofenadine) 180mg  (one tablets) + Pepcid (famotidine) 20mg   - Evening:  Allegra (fexofenadine) 180mg  (one tablets) + Pepcid (famotidine) 20mg  - Be sure to moisturize very well on a daily basis.   2. Return in about 3 months (around 12/24/2019). This can be an in-person, a virtual Webex or a telephone follow up visit.   Subjective:   [name] is a 79 y.o. male presenting today for follow up of   Chief  Complaint  Patient presents with  . Immunotherapy    Wants to discuss Xolair.     [name] has a history of the following: Patient Active Problem List   Diagnosis Date Noted  . Redness and swelling of thigh 06/05/2018  . Adverse drug reaction 06/05/2018  . Bacteremia associated with intravascular line ([place]) 04/20/2018  . Febrile neutropenia ([place]) 04/13/2018  . Gram positive sepsis ([place]) 04/13/2018  . SIRS (systemic inflammatory response syndrome) ([place]) 04/12/2018  . Bacteremia due to Gram-positive bacteria 04/12/2018  . Pancytopenia ([place]) 04/12/2018  . Status post chemotherapy   . UTI (urinary tract infection) 04/11/2018  . Port-A-Cath in place 02/20/2018  . Encounter for antineoplastic chemotherapy 02/17/2018  . Sepsis due to urinary tract infection ([place]) 01/25/2018  . Sepsis secondary to UTI ([place]) 01/25/2018  . Hyperglycemia   . Goals of care, counseling/discussion 01/18/2018  . Bone metastasis ([place]) 12/29/2017  . 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 Bladder cancer ([place]) 06/28/2017  . Malignant neoplasm of urinary bladder ([place]) 04/20/2017  . COPD UNSPECIFIED 03/07/2008   PCP: Dr. Gerarda Fraction  Oncologist: Dr. Alen Blew  History obtained from: chart review and patient.  [name] is a 80 y.o. male presenting for a follow up visit.  He has a longstanding history of chronic urticaria with pruritus.  Symptoms have been ongoing for about 3 years since he has been treated for his bladder cancer.  It was thought that his itching was related to the chemotherapy, but he has not received any chemotherapy in approximately  18 months.  He also receives chronic opiates to treat his pain secondary to his bladder cancer.  I have also considered this as a cause of his urticaria and pruritus.  When he was last seen in December 2020, he seemed to be doing well.  But as always, his history was not the greatest.  He continued to ask for azithromycin for his hives, but we told him this was not  an indication to use azithromycin.  We did send in a prednisone Dosepak.  We did start him on Xolair, which he receives once a month.  He is supposed to be on cetirizine and Pepcid twice daily, but is not clear that he is taking this regularly. Since the last visit, he has continued to itch. He is using famotidine, but is not using the cetirizine.   Since last visit, he continues to have some intermittent breakouts.  He tells me today that he would like to be on prednisone every day to help keep his pain.  This seems to be the only thing that helps.  He is open to some medication changes after we discussed the side effects of chronic prednisone use.  However, he keeps coming back to prednisone during the visit and tells me how great it works.  There seems to be no trigger for these outbreaks, but he does feel that the addition of the Xolair has helped.  Otherwise, there have been no changes to his past medical history, surgical history, family history, or social history.    Review of Systems  Constitutional: Negative.  Negative for fever, malaise/fatigue and weight loss.  HENT: Positive for sinus pain. Negative for congestion, ear discharge, ear pain and sore throat.   Eyes: Negative for pain, discharge and redness.  Respiratory: Negative for cough, sputum production, shortness of breath and wheezing.   Cardiovascular: Negative.  Negative for chest pain and palpitations.  Gastrointestinal: Negative for abdominal pain, constipation, diarrhea, heartburn, nausea and vomiting.  Skin: Positive for itching and rash.  Neurological: Negative for dizziness and headaches.  Endo/Heme/Allergies: Negative for environmental allergies. Does not bruise/bleed easily.       Objective:   Blood pressure 138/82, pulse 78, temperature 98.1 F (36.7 C), temperature source Temporal, resp. rate 18, SpO2 96 %. There is no height or weight on file to calculate BMI.   Physical Exam:  Physical Exam   Constitutional: He appears well-developed.  HENT:  Head: Normocephalic and atraumatic.  Right Ear: Tympanic membrane, external ear and ear canal normal.  Left Ear: Tympanic membrane, external ear and ear canal normal.  Nose: Nose normal. No mucosal edema, rhinorrhea, nasal deformity or septal deviation. No epistaxis. Right sinus exhibits no maxillary sinus tenderness and no frontal sinus tenderness. Left sinus exhibits no maxillary sinus tenderness and no frontal sinus tenderness.  Mouth/Throat: Uvula is midline and oropharynx is clear and moist. Mucous membranes are not pale and not dry.  Eyes: Pupils are equal, round, and reactive to light. Conjunctivae and EOM are normal. Right eye exhibits no chemosis and no discharge. Left eye exhibits no chemosis and no discharge. Right conjunctiva is not injected. Left conjunctiva is not injected.  Respiratory: Effort normal and breath sounds normal. No accessory muscle usage. No tachypnea. No respiratory distress. He has no wheezes. He has no rhonchi.  Skin: No abrasion, no petechiae and no rash noted. Rash is not papular, not vesicular and not urticarial. No erythema. No pallor.  There are some excoriation marks present on the bilateral flanks.  There are no urticarial or eczematous lesions noted.  There are some excoriation marks on the patient's neck as well.  Psychiatric: He has a normal mood and affect.     Diagnostic studies: none     Salvatore Marvel, MD  [facility]

## 2019-09-26 NOTE — Patient Instructions (Addendum)
1. Itching and chronic urticaria (hives) - Since he has gotten some improvement with his Xolair, we are going to increase it to every 2 weeks. - Prednisone is not a good long-term solution since it has so many bad side effects, including immunosuppression. - We are going to change her antihistamines to see if this provides any better relief. - In the meantime, start suppressive dosing of antihistamines:   - Morning: Allegra (fexofenadine ) 180mg  (one tablets) + Pepcid (famotidine) 20mg   - Evening:  Allegra (fexofenadine ) 180mg  (one tablets) + Pepcid (famotidine) 20mg  - Be sure to moisturize very well on a daily basis.   2. Return in about 3 months (around 12/24/2019). This can be an in-person, a virtual Webex or a telephone follow up visit.   Please inform us of any Emergency Department visits, hospitalizations, or changes in symptoms. Call us before going to the ED for breathing or allergy symptoms since we might be able to fit you in for a sick visit. Feel free to contact us anytime with any questions, problems, or concerns.  It was a pleasure to see you again today!  Websites that have reliable patient information: 1. American Academy of Asthma, Allergy, and Immunology: www.aaaai.org 2. Food Allergy Research and Education (FARE): foodallergy.org 3. Mothers of Asthmatics: http://www.asthmacommunitynetwork.org 4. American College of Allergy, Asthma, and Immunology: www.acaai.org   COVID-19 Vaccine Information can be found at: ShippingScam.co.uk For questions related to vaccine distribution or appointments, please email vaccine@[email or web address] .[email or web address] or call [phone].     "Like" [place] on Facebook and Instagram for our latest updates!        Make sure you are registered to vote! If you have moved or changed any of your contact information, you will need to get this updated before voting!  In some cases, you MAY be able to register  to vote online: [email or web address]

## 2019-09-27 ENCOUNTER — Ambulatory Visit (HOSPITAL_COMMUNITY)
Admission: RE | Admit: 2019-09-27 | Discharge: 2019-09-27 | Disposition: A | Discharge status: Home / Self Care | Payer: Medicare Other | Source: Ambulatory Visit | Attending: Oncology | Admitting: Oncology

## 2019-09-27 ENCOUNTER — Inpatient Hospital Stay: Payer: Medicare Other | Attending: Oncology

## 2019-09-27 ENCOUNTER — Other Ambulatory Visit: Payer: Self-pay

## 2019-09-27 ENCOUNTER — Telehealth: Payer: Self-pay

## 2019-09-27 ENCOUNTER — Inpatient Hospital Stay (HOSPITAL_BASED_OUTPATIENT_CLINIC_OR_DEPARTMENT_OTHER): Payer: Medicare Other | Admitting: Medical

## 2019-09-27 DIAGNOSIS — Z87891 Personal history of nicotine dependence: Secondary | ICD-10-CM | POA: Insufficient documentation

## 2019-09-27 DIAGNOSIS — R911 Solitary pulmonary nodule: Secondary | ICD-10-CM | POA: Diagnosis not present

## 2019-09-27 DIAGNOSIS — C679 Malignant neoplasm of bladder, unspecified: Secondary | ICD-10-CM | POA: Diagnosis not present

## 2019-09-27 DIAGNOSIS — Z79899 Other long term (current) drug therapy: Secondary | ICD-10-CM | POA: Insufficient documentation

## 2019-09-27 DIAGNOSIS — Z923 Personal history of irradiation: Secondary | ICD-10-CM | POA: Diagnosis not present

## 2019-09-27 DIAGNOSIS — B029 Zoster without complications: Secondary | ICD-10-CM

## 2019-09-27 DIAGNOSIS — Z9221 Personal history of antineoplastic chemotherapy: Secondary | ICD-10-CM | POA: Diagnosis not present

## 2019-09-27 DIAGNOSIS — C7951 Secondary malignant neoplasm of bone: Secondary | ICD-10-CM | POA: Insufficient documentation

## 2019-09-27 LAB — CBC WITH DIFFERENTIAL (CANCER CENTER ONLY)
Abs Immature Granulocytes: 0.04 10*3/uL (ref 0.00–0.07)
Basophils Absolute: 0.1 10*3/uL (ref 0.0–0.1)
Basophils Relative: 1 %
Eosinophils Absolute: 0.2 10*3/uL (ref 0.0–0.5)
Eosinophils Relative: 2 %
HCT: 43.3 % (ref 39.0–52.0)
Hemoglobin: 14.5 g/dL (ref 13.0–17.0)
Immature Granulocytes: 1 %
Lymphocytes Relative: 9 %
Lymphs Abs: 0.7 10*3/uL (ref 0.7–4.0)
MCH: 36.1 pg — ABNORMAL HIGH (ref 26.0–34.0)
MCHC: 33.5 g/dL (ref 30.0–36.0)
MCV: 107.7 fL — ABNORMAL HIGH (ref 80.0–100.0)
Monocytes Absolute: 0.8 10*3/uL (ref 0.1–1.0)
Monocytes Relative: 10 %
Neutro Abs: 6.3 10*3/uL (ref 1.7–7.7)
Neutrophils Relative %: 77 %
Platelet Count: 201 10*3/uL (ref 150–400)
RBC: 4.02 MIL/uL — ABNORMAL LOW (ref 4.22–5.81)
RDW: 13.2 % (ref 11.5–15.5)
WBC Count: 8.2 10*3/uL (ref 4.0–10.5)
nRBC: 0 % (ref 0.0–0.2)

## 2019-09-27 LAB — CMP (CANCER CENTER ONLY)
ALT: 15 U/L (ref 0–44)
AST: 11 U/L — ABNORMAL LOW (ref 15–41)
Albumin: 3 g/dL — ABNORMAL LOW (ref 3.5–5.0)
Alkaline Phosphatase: 64 U/L (ref 38–126)
Anion gap: 8 (ref 5–15)
BUN: 12 mg/dL (ref 8–23)
CO2: 26 mmol/L (ref 22–32)
Calcium: 8.4 mg/dL — ABNORMAL LOW (ref 8.9–10.3)
Chloride: 107 mmol/L (ref 98–111)
Creatinine: 0.85 mg/dL (ref 0.61–1.24)
GFR, Est AFR Am: >60 mL/min (ref >60)
GFR, Estimated: >60 mL/min (ref >60)
Glucose, Bld: 95 mg/dL (ref 70–99)
Potassium: 4.2 mmol/L (ref 3.5–5.1)
Sodium: 141 mmol/L (ref 135–145)
Total Bilirubin: 0.5 mg/dL (ref 0.3–1.2)
Total Protein: 5.7 g/dL — ABNORMAL LOW (ref 6.5–8.1)

## 2019-09-27 MED ORDER — SODIUM CHLORIDE (PF) 0.9 % IJ SOLN
INTRAMUSCULAR | Status: AC
  Filled 2019-09-27: qty 50

## 2019-09-27 MED ORDER — PREDNISONE 5 MG PO TABS: ORAL_TABLET | ORAL | 0 refills | Status: DC

## 2019-09-27 MED ORDER — SODIUM CHLORIDE 0.9 % IV SOLN
INTRAVENOUS | Status: AC
  Filled 2019-09-27: qty 250

## 2019-09-27 MED ORDER — IOHEXOL 300 MG/ML  SOLN
100.0000 mL | Freq: Once | INTRAMUSCULAR | Status: AC | PRN
  Administered 2019-09-27: 100 mL via INTRAVENOUS

## 2019-09-27 MED ORDER — VALACYCLOVIR HCL 1 G PO TABS: 1000.0000 mg | ORAL_TABLET | Freq: Three times a day (TID) | ORAL | 0 refills | Status: DC

## 2019-09-27 MED ORDER — GABAPENTIN 300 MG PO CAPS: 300.0000 mg | ORAL_CAPSULE | Freq: Every day | ORAL | 0 refills | Status: DC

## 2019-09-27 NOTE — Progress Notes (Signed)
Pt seen by PA Lucianne Lei only, no assessment by [facility] RN or vitals by [facility] NT at this time.  PA Lucianne Lei aware.

## 2019-09-27 NOTE — Telephone Encounter (Signed)
Patient in the lobby after lab and Ct scan appt's. He stated that he is having increased pain of his right hip and his current pain regimen is no longer controlling the pain. Explained that will make Dr. Alen Blew aware of the increased pain but he will most likely want to wait for the scan results prior to addressing the pain management. Patient verbalized understanding and stated that he already has an appt next week (2/18). Dr. Alen Blew made aware and agrees with plan. Patient also c/o possible shingles rash on his right lower back. Explained that will discuss with [name] PA in [facility] and see if he can evaluate. Patient aware to wait in lobby to be seen by [name] PA. No other questions or concerns at this time.

## 2019-09-28 NOTE — Progress Notes (Signed)
[facility] Progress Note   [name] [id number] 1941/05/15 79 y.o.  [name] is managed by Dr. Zola Button  Actively treated with chemotherapy/immunotherapy/hormonal therapy: maintained on active surveillance  Next scheduled appointment with provider: to be arranged  Assessment: Plan:    Malignant neoplasm of urinary bladder, unspecified site ([place])  Herpes zoster without complication - Plan: valACYclovir (VALTREX) 1000 MG tablet, predniSONE (DELTASONE) 5 MG tablet, gabapentin (NEURONTIN) 300 MG capsule  Bone metastasis (H[facility])   Metastatic malignant neoplasm of the bladder: The patient continues to be followed by Dr. Bernadene Bell and is under active surveillance.  Herpes zoster: The patient was placed on a prescription of Valtrex 1000 mg p.o. 3 times daily, a 6-day prednisone taper, and gabapentin 300 mg p.o. nightly x3 weeks.  Please see After Visit Summary for patient specific instructions.  Future Appointments  Date Time Provider [employer]  10/04/2019  9:30 AM [name], MD [facility] None  10/05/2019  1:30 PM [facility] NURSE [facility] None  12/26/2019  3:15 PM Valentina Shaggy, MD [facility] None    No orders of the defined types were placed in this encounter.      Subjective:   Patient ID:  [name] is a 79 y.o. (DOB April 04, 1941) male.  Chief Complaint: No chief complaint on file.   HPI [name]  Is a 79 y.o. male with a diagnosis of a metastatic malignant neoplasm of the bladder. He continues to be followed by Dr. Bernadene Bell and is under active surveillance.  He presents to the clinic today with a report of a painful rash on his right buttock.  He noted development of a painful rash there yesterday.  He has had a shingles vaccine.  He denies fevers, chills, sweats, or respiratory symptoms.  Medications: I have reviewed the patient's current medications.  Allergies:  Allergies  Allergen Reactions  .  Ciprofloxacin Hives  . Cefepime Rash    Past Medical History:  Diagnosis Date  . Bladder cancer ([place])   . BPH (benign prostatic hypertrophy)   . Dysuria   . GERD (gastroesophageal reflux disease)   . History of adenomatous polyp of colon    tubular adenoma 06/ 2018  . History of bladder cancer 12/2014   urologist-  dr [name]--  dx High Grade TCC without stromal invasion s/p TURBT and intravesical BCG tx/  recurrent 07/2015 (pTa) TCC  repear intravesical BCG tx   . History of hiatal hernia   . History of urethral stricture   . Nocturia     Past Surgical History:  Procedure Laterality Date  . CARDIAC CATHETERIZATION  1997   normal coronary arteries  . CARDIOVASCULAR STRESS TEST  12-14-2005   Low risk perfusion study/  very small scar in the inferior septum from mid ventricle to apex,  no ischemia/  mild inferior septal hypokinesis/  ef 58%  . CATARACT EXTRACTION W/ INTRAOCULAR LENS  IMPLANT, BILATERAL  2011  . COLONOSCOPY  last one 06/ 2018  . CYSTOSCOPY WITH BIOPSY N/A 08/12/2015   Procedure: CYSTOSCOPY WITH BIOPSY AND FULGERATION;  Surgeon: [name], MD;  Location: [facility];  Service: Urology;  Laterality: N/A;  . CYSTOSCOPY WITH INJECTION N/A 06/29/2017   Procedure: CYSTOSCOPY WITH INJECTION OF INDOCYANINE GREEN DYE;  Surgeon: Alexis Frock, MD;  Location: [facility];  Service: Urology;  Laterality: N/A;  . CYSTOSCOPY WITH URETHRAL DILATATION N/A 03/21/2017   Procedure: CYSTOSCOPY;  Surgeon: [name], MD;  Location: [facility];  Service: Urology;  Laterality: N/A;  . ESOPHAGOGASTRODUODENOSCOPY  12-05-2014  . IR IMAGING GUIDED PORT INSERTION  02/07/2018  . IR REMOVAL TUN ACCESS W/ PORT W/O FL MOD SED  04/14/2018  . TRANSTHORACIC ECHOCARDIOGRAM  01-31-2008   nomral LVF,  ef 55-65%/  mild pulmonic stenosis without regurg.,  peak transpulomonic valve gradient 45mmHg  . TRANSURETHRAL RESECTION OF BLADDER TUMOR N/A 12/31/2014    Procedure: TRANSURETHRAL RESECTION OF BLADDER TUMOR (TURBT);  Surgeon: Lowella Bandy, MD;  Location[facility];  Service: Urology;  Laterality: N/A;  . TRANSURETHRAL RESECTION OF BLADDER TUMOR N/A 03/21/2017   Procedure: POSSIBLE TRANSURETHRAL RESECTION OF BLADDER TUMOR (TURBT);  Surgeon: [name], MD;  Location: [facility];  Service: Urology;  Laterality: N/A;    Family History  Problem Relation Age of Onset  . Alcoholism Father   . Colon cancer Neg Hx   . Colon polyps Neg Hx   . Diabetes Neg Hx   . Kidney disease Neg Hx   . Esophageal cancer Neg Hx   . Heart disease Neg Hx   . Gallbladder disease Neg Hx   . Allergic rhinitis Neg Hx   . Angioedema Neg Hx   . Asthma Neg Hx   . Atopy Neg Hx   . Eczema Neg Hx   . Immunodeficiency Neg Hx   . Urticaria Neg Hx     Social History   Socioeconomic History  . Marital status: Married    Spouse name: Not on file  . Number of children: 1  . Years of education: Not on file  . Highest education level: Not on file  Occupational History  . Occupation: Retired  Tobacco Use  . Smoking status: Former Smoker    Packs/day: 1.00    Years: 34.00    Pack years: 34.00    Types: Cigarettes    Quit date: 12/26/1988    Years since quitting: 30.7  . Smokeless tobacco: Never Used  Substance and Sexual Activity  . Alcohol use: Yes    Alcohol/week: 6.0 standard drinks    Types: 6 Cans of beer per week    Comment: BEER  . Drug use: No  . Sexual activity: Never  Other Topics Concern  . Not on file  Social History Narrative  . Not on file   Social Determinants of Health   Financial Resource Strain:   . Difficulty of Paying Living Expenses: Not on file  Food Insecurity:   . Worried About Charity fundraiser in the Last Year: Not on file  . Ran Out of Food in the Last Year: Not on file  Transportation Needs:   . Lack of Transportation (Medical): Not on file  . Lack of Transportation (Non-Medical): Not  on file  Physical Activity:   . Days of Exercise per Week: Not on file  . Minutes of Exercise per Session: Not on file  Stress:   . Feeling of Stress : Not on file  Social Connections:   . Frequency of Communication with Friends and Family: Not on file  . Frequency of Social Gatherings with Friends and Family: Not on file  . Attends Religious Services: Not on file  . Active Member of Clubs or Organizations: Not on file  . Attends Archivist Meetings: Not on file  . Marital Status: Not on file  Intimate Partner Violence:   . Fear of Current or Ex-Partner: Not on file  . Emotionally  Abused: Not on file  . Physically Abused: Not on file  . Sexually Abused: Not on file    Past Medical History, Surgical history, Social history, and Family history were reviewed and updated as appropriate.   Please see review of systems for further details on the patient's review from today.   Review of Systems:  Review of Systems  Constitutional: Negative for chills, diaphoresis and fever.  HENT: Negative for facial swelling and trouble swallowing.   Respiratory: Negative for cough, chest tightness and shortness of breath.   Cardiovascular: Negative for chest pain.  Skin: Positive for rash.    Objective:   Physical Exam:  There were no vitals taken for this visit. ECOG: 0  Physical Exam Constitutional:      General: He is not in acute distress.    Appearance: Normal appearance. He is not ill-appearing.  HENT:     Head: Normocephalic and atraumatic.  Skin:    Findings: Rash present.     Comments: Multiple vesicles on erythematous bases are noted on the patient's right mid buttock.  These areas are tender to the touch.  Neurological:     Mental Status: He is alert.     Coordination: Coordination normal.     Gait: Gait normal.  Psychiatric:        Mood and Affect: Mood normal.        Behavior: Behavior normal.        Thought Content: Thought content normal.        Judgment:  Judgment normal.     Lab Review:     Component Value Date/Time   NA 141 09/27/2019 0759   NA 140 04/30/2019 1033   NA 141 05/23/2017 0828   K 4.2 09/27/2019 0759   K 4.0 05/23/2017 0828   CL 107 09/27/2019 0759   CO2 26 09/27/2019 0759   CO2 27 05/23/2017 0828   GLUCOSE 95 09/27/2019 0759   GLUCOSE 97 05/23/2017 0828   BUN 12 09/27/2019 0759   BUN 16 04/30/2019 1033   BUN 12.5 05/23/2017 0828   CREATININE 0.85 09/27/2019 0759   CREATININE 0.8 05/23/2017 0828   CALCIUM 8.4 (L) 09/27/2019 0759   CALCIUM 9.0 05/23/2017 0828   PROT 5.7 (L) 09/27/2019 0759   PROT 5.7 (L) 04/30/2019 1033   PROT 5.6 (L) 05/23/2017 0828   ALBUMIN 3.0 (L) 09/27/2019 0759   ALBUMIN 3.7 04/30/2019 1033   ALBUMIN 2.5 (L) 05/23/2017 0828   AST 11 (L) 09/27/2019 0759   AST 13 05/23/2017 0828   ALT 15 09/27/2019 0759   ALT 15 05/23/2017 0828   ALKPHOS 64 09/27/2019 0759   ALKPHOS 57 05/23/2017 0828   BILITOT 0.5 09/27/2019 0759   BILITOT 0.37 05/23/2017 0828   GFRNONAA >60 09/27/2019 0759   GFRAA >60 09/27/2019 0759       Component Value Date/Time   WBC 8.2 09/27/2019 0759   WBC 13.6 (H) 03/09/2019 1116   RBC 4.02 (L) 09/27/2019 0759   HGB 14.5 09/27/2019 0759   HGB 12.7 (L) 05/23/2017 0828   HCT 43.3 09/27/2019 0759   HCT 37.3 (L) 05/23/2017 0828   PLT 201 09/27/2019 0759   PLT 298 05/23/2017 0828   MCV 107.7 (H) 09/27/2019 0759   MCV 104.2 (H) 05/23/2017 0828   MCH 36.1 (H) 09/27/2019 0759   MCHC 33.5 09/27/2019 0759   RDW 13.2 09/27/2019 0759   RDW 13.6 05/23/2017 0828   LYMPHSABS 0.7 09/27/2019 0759   LYMPHSABS 0.8 (L) 05/23/2017  0828   MONOABS 0.8 09/27/2019 0759   MONOABS 1.0 (H) 05/23/2017 0828   EOSABS 0.2 09/27/2019 0759   EOSABS 0.1 05/23/2017 0828   BASOSABS 0.1 09/27/2019 0759   BASOSABS 0.0 05/23/2017 0828   -------------------------------  Imaging from last 24 hours (if applicable):  Radiology interpretation: CT Chest W Contrast  Result Date:  09/27/2019 CLINICAL DATA:  Patient with history of invasive bladder carcinoma status post cystoprostatectomy and urinary diversion. History of chemotherapy and radiation. Follow-up exam. EXAM: CT CHEST, ABDOMEN, AND PELVIS WITH CONTRAST TECHNIQUE: Multidetector CT imaging of the chest, abdomen and pelvis was performed following the standard protocol during bolus administration of intravenous contrast. CONTRAST:  154mL OMNIPAQUE IOHEXOL 300 MG/ML  SOLN COMPARISON:  CT CP 05/22/2019 FINDINGS: CT CHEST FINDINGS Cardiovascular: Normal heart size. Thoracic aortic and coronary arterial vascular calcifications. No pericardial effusion. Aorta and main pulmonary artery normal in caliber. Mediastinum/Nodes: No axillary adenopathy. Interval increase in size of precarinal lymph node measuring 1.9 cm (image 25; series 3), previously 0.9 cm. No hilar adenopathy. Small hiatal hernia. Lungs/Pleura: Central airways are patent. Interval development of a 7 mm right upper lobe subpleural nodule (image 30; series 5). Similar 1.2 cm irregular nodule left upper lobe (image 42; series 5). Small bilateral pleural effusions. No pneumothorax. Musculoskeletal: Thoracic spine degenerative changes. No aggressive or acute appearing osseous lesions. CT ABDOMEN PELVIS FINDINGS Hepatobiliary: The liver is normal in size and contour. Unchanged hepatic cysts. Gallbladder is unremarkable. No intrahepatic or extrahepatic biliary ductal dilatation. Pancreas: Unremarkable Spleen: Unremarkable Adrenals/Urinary Tract: Normal adrenal glands. Kidneys enhance symmetrically with contrast. Left extrarenal pelvis is present. Patient status post cysto prostatectomy with ileal conduit diversion, unchanged from prior. Stomach/Bowel: Normal morphology of the stomach. No evidence for bowel obstruction. No free fluid or free intraperitoneal air. Along the margin of the sigmoid colon (image 50; series 6) there is a 1.5 cm focal soft tissue area with mild surrounding fat  stranding. Vascular/Lymphatic: Normal caliber abdominal aorta. Peripheral calcified atherosclerotic plaque. No retroperitoneal lymphadenopathy. Reproductive: Surgically absent. Other: None. Musculoskeletal: Lower thoracic and lumbar spine degenerative changes. Similar-appearing destructive lesion along the inferior right pubic ramus with the soft tissue measuring approximately 5.2 x 3.5 cm (image 132; series 3, previously 5.1 x 3.5 cm. IMPRESSION: 1. Interval development of a 7 mm right upper lobe subpleural nodule. Interval thickening of a precarinal lymph node. Findings raise the possibility of metastatic disease. Recommend attention on follow-up. 2. Similar-appearing left upper lobe irregular nodule. 3. Similar-appearing destructive lesion involving the inferior right pubic ramus. 4. New small bilateral pleural effusions. Malignant effusions not excluded. 5. There is a small focal area of soft tissue along the margin of the sigmoid colon with surrounding fat stranding which may represent mild early diverticulitis. Less likely this could represent a primary colonic lesion. Recommend attention on follow-up exam. Aortic Atherosclerosis (ICD10-I70.0). Electronically Signed   By: Lovey Newcomer M.D.   On: 09/27/2019 12:00   CT Abdomen Pelvis W Contrast  Result Date: 09/27/2019 CLINICAL DATA:  Patient with history of invasive bladder carcinoma status post cystoprostatectomy and urinary diversion. History of chemotherapy and radiation. Follow-up exam. EXAM: CT CHEST, ABDOMEN, AND PELVIS WITH CONTRAST TECHNIQUE: Multidetector CT imaging of the chest, abdomen and pelvis was performed following the standard protocol during bolus administration of intravenous contrast. CONTRAST:  149mL OMNIPAQUE IOHEXOL 300 MG/ML  SOLN COMPARISON:  CT CP 05/22/2019 FINDINGS: CT CHEST FINDINGS Cardiovascular: Normal heart size. Thoracic aortic and coronary arterial vascular calcifications. No pericardial effusion. Aorta and  main pulmonary  artery normal in caliber. Mediastinum/Nodes: No axillary adenopathy. Interval increase in size of precarinal lymph node measuring 1.9 cm (image 25; series 3), previously 0.9 cm. No hilar adenopathy. Small hiatal hernia. Lungs/Pleura: Central airways are patent. Interval development of a 7 mm right upper lobe subpleural nodule (image 30; series 5). Similar 1.2 cm irregular nodule left upper lobe (image 42; series 5). Small bilateral pleural effusions. No pneumothorax. Musculoskeletal: Thoracic spine degenerative changes. No aggressive or acute appearing osseous lesions. CT ABDOMEN PELVIS FINDINGS Hepatobiliary: The liver is normal in size and contour. Unchanged hepatic cysts. Gallbladder is unremarkable. No intrahepatic or extrahepatic biliary ductal dilatation. Pancreas: Unremarkable Spleen: Unremarkable Adrenals/Urinary Tract: Normal adrenal glands. Kidneys enhance symmetrically with contrast. Left extrarenal pelvis is present. Patient status post cysto prostatectomy with ileal conduit diversion, unchanged from prior. Stomach/Bowel: Normal morphology of the stomach. No evidence for bowel obstruction. No free fluid or free intraperitoneal air. Along the margin of the sigmoid colon (image 50; series 6) there is a 1.5 cm focal soft tissue area with mild surrounding fat stranding. Vascular/Lymphatic: Normal caliber abdominal aorta. Peripheral calcified atherosclerotic plaque. No retroperitoneal lymphadenopathy. Reproductive: Surgically absent. Other: None. Musculoskeletal: Lower thoracic and lumbar spine degenerative changes. Similar-appearing destructive lesion along the inferior right pubic ramus with the soft tissue measuring approximately 5.2 x 3.5 cm (image 132; series 3, previously 5.1 x 3.5 cm. IMPRESSION: 1. Interval development of a 7 mm right upper lobe subpleural nodule. Interval thickening of a precarinal lymph node. Findings raise the possibility of metastatic disease. Recommend attention on follow-up. 2.  Similar-appearing left upper lobe irregular nodule. 3. Similar-appearing destructive lesion involving the inferior right pubic ramus. 4. New small bilateral pleural effusions. Malignant effusions not excluded. 5. There is a small focal area of soft tissue along the margin of the sigmoid colon with surrounding fat stranding which may represent mild early diverticulitis. Less likely this could represent a primary colonic lesion. Recommend attention on follow-up exam. Aortic Atherosclerosis (ICD10-I70.0). Electronically Signed   By: Lovey Newcomer M.D.   On: 09/27/2019 12:00

## 2019-10-03 ENCOUNTER — Telehealth: Payer: Self-pay | Admitting: Oncology

## 2019-10-03 NOTE — Telephone Encounter (Signed)
Called patient to inform them that his appt for 2/18 will be a phone visit.  Spoke with pt and he is aware of his appt being a phone visit only.

## 2019-10-04 ENCOUNTER — Ambulatory Visit: Payer: Medicare Other | Admitting: Oncology

## 2019-10-04 ENCOUNTER — Inpatient Hospital Stay (HOSPITAL_BASED_OUTPATIENT_CLINIC_OR_DEPARTMENT_OTHER): Payer: Medicare Other | Admitting: Oncology

## 2019-10-04 DIAGNOSIS — C679 Malignant neoplasm of bladder, unspecified: Secondary | ICD-10-CM

## 2019-10-04 DIAGNOSIS — L501 Idiopathic urticaria: Secondary | ICD-10-CM | POA: Diagnosis not present

## 2019-10-04 IMAGING — US US EXTREM LOW VENOUS BILAT
1 series · 13 of 24 positions shown · non-contrast
Comparison: None.

CLINICAL DATA: Bilateral pain and edema



[Series 1: us extrem low venous bilat · 0.10mm/px · 13 of 68 slices shown]
[im 1/68]
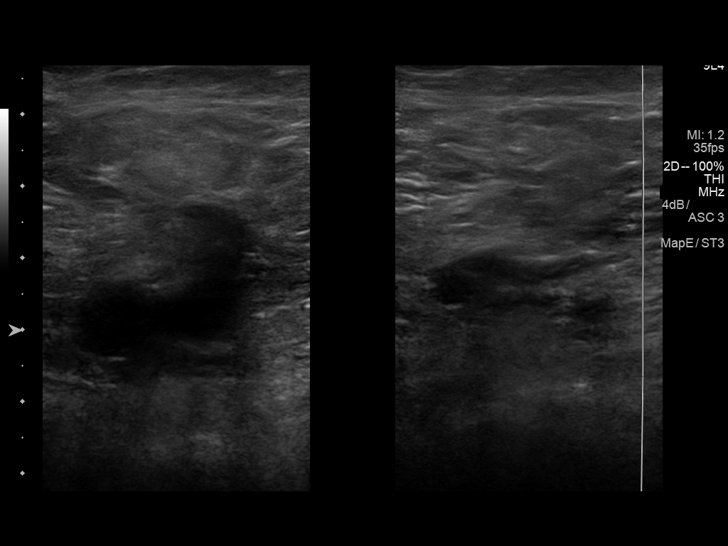
[im 6/68]
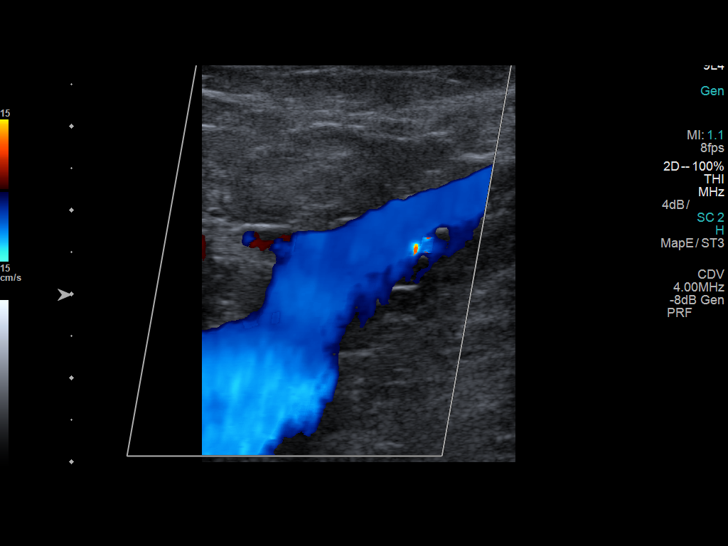
[im 12/68]
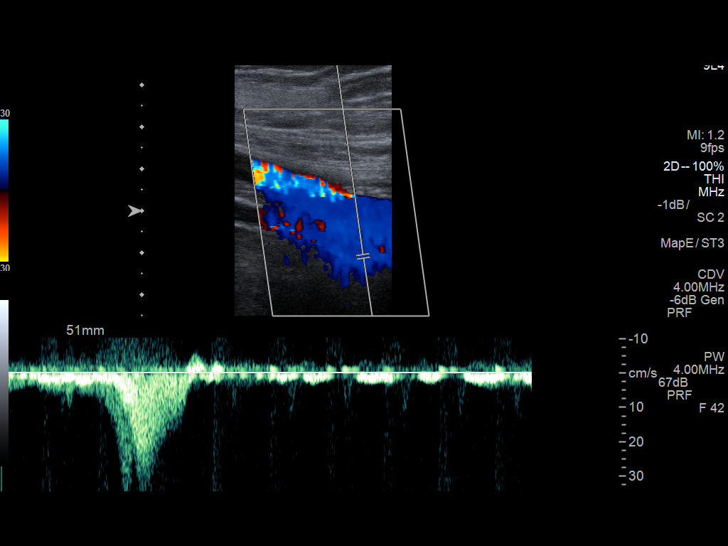
[im 18/68]
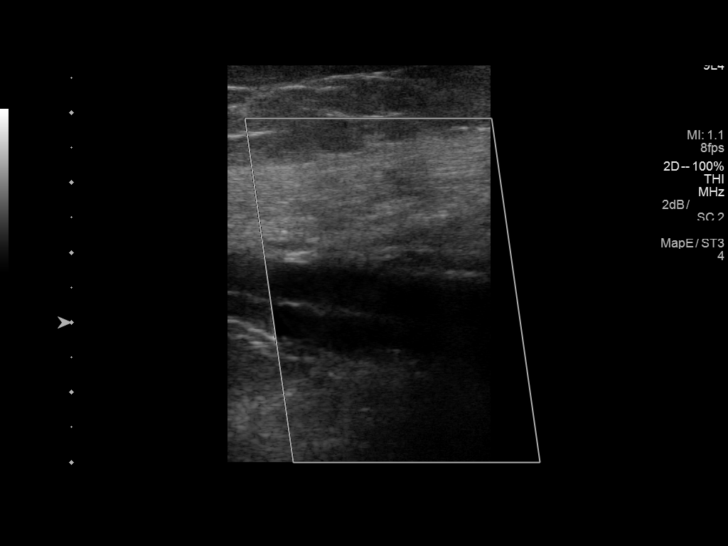
[im 24/68]
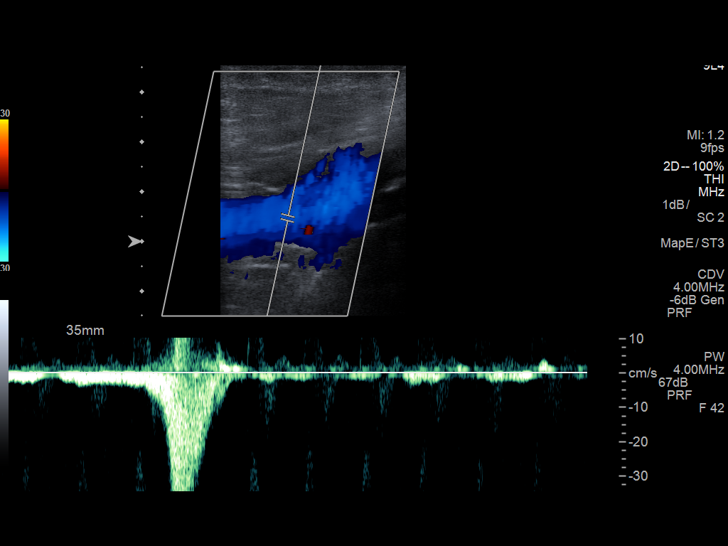
[im 30/68]
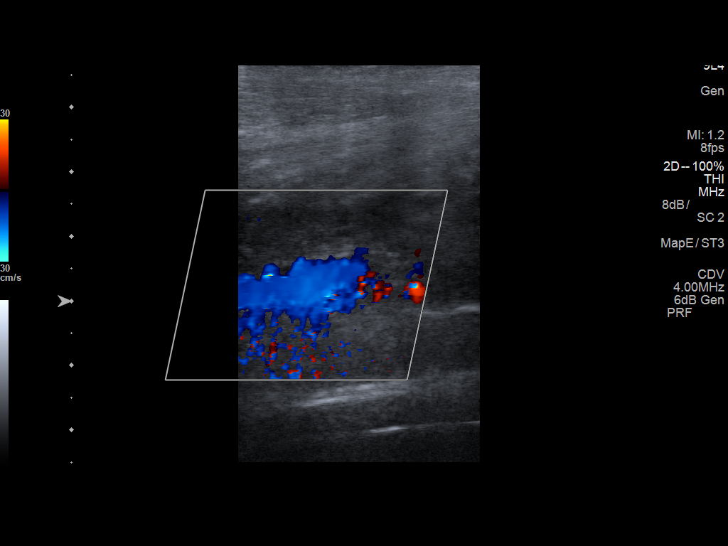
[im 35/68]
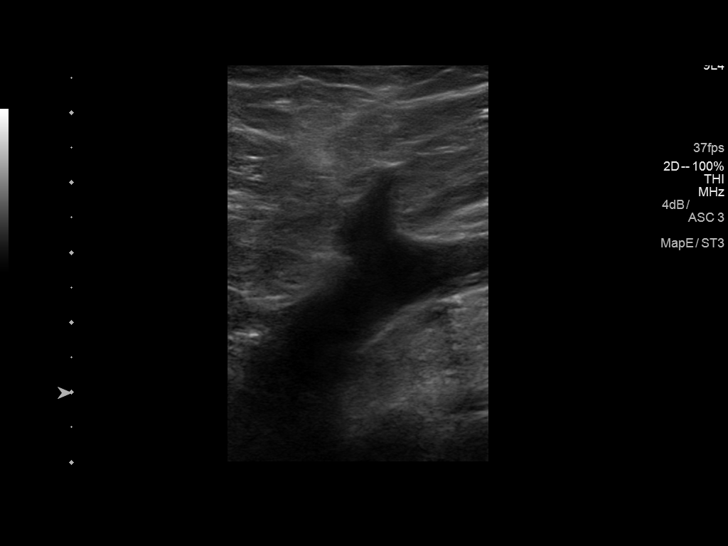
[im 38/68]
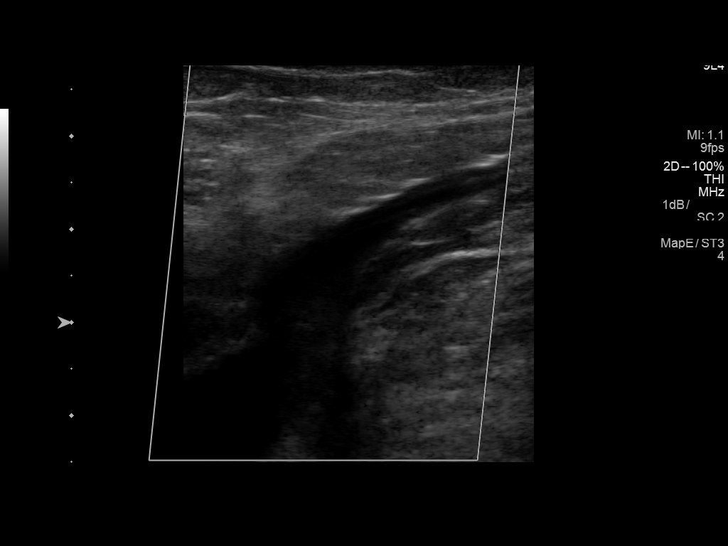
[im 44/68]
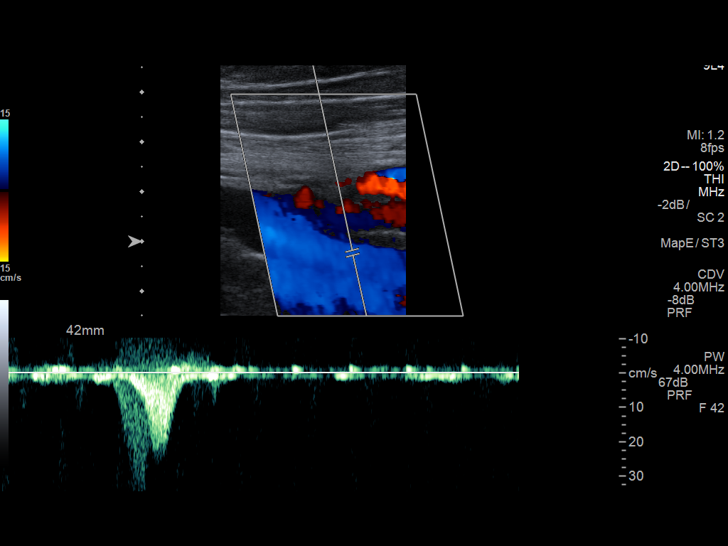
[im 50/68]
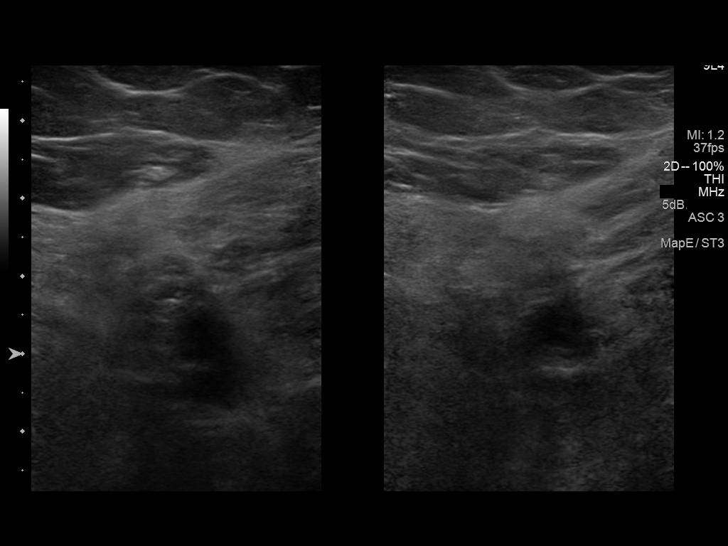
[im 56/68]
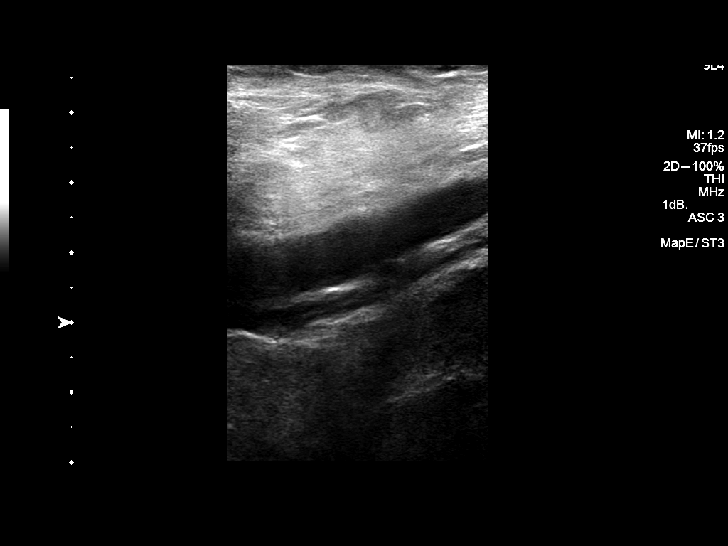
[im 62/68]
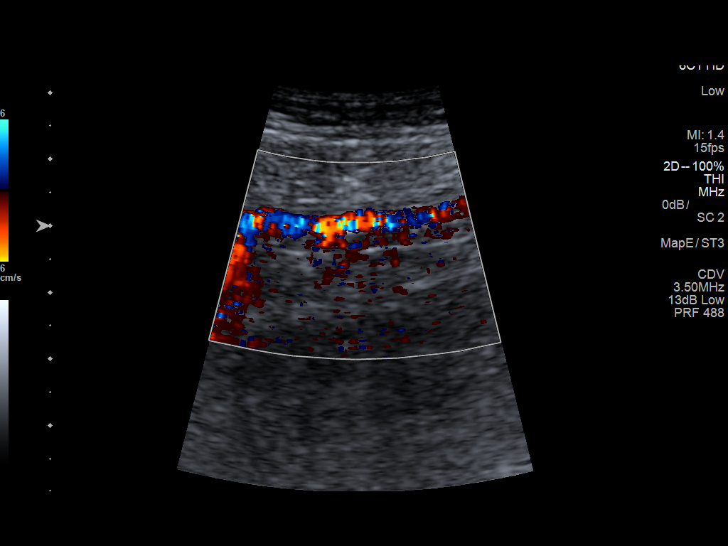
[im 68/68]
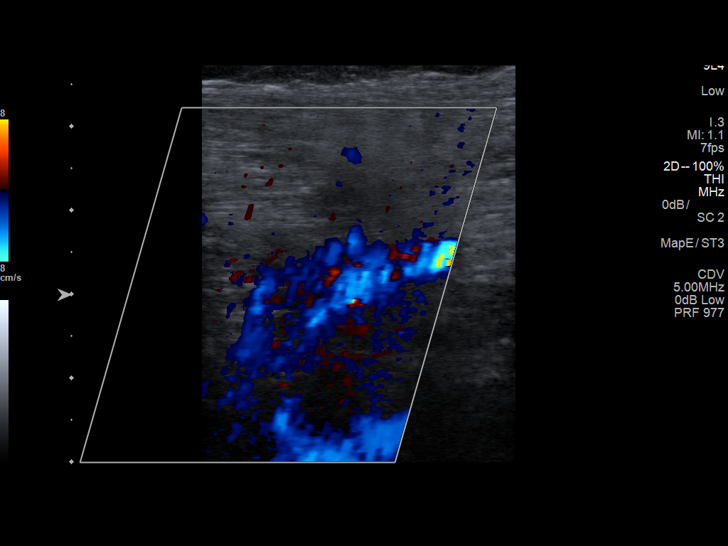

[13 of 24 positions shown; findings below may reference images not displayed]

FINDINGS: RIGHT LOWER EXTREMITY

Common Femoral Vein: No evidence of thrombus. Normal
compressibility, respiratory phasicity and response to augmentation.

Saphenofemoral Junction: No evidence of thrombus. Normal
compressibility and flow on color Doppler imaging.

Profunda Femoral Vein: No evidence of thrombus. Normal
compressibility and flow on color Doppler imaging.

Femoral Vein: No evidence of thrombus. Normal compressibility,
respiratory phasicity and response to augmentation.

Popliteal Vein: No evidence of thrombus. Normal compressibility,
respiratory phasicity and response to augmentation.

Calf Veins: No evidence of thrombus. Normal compressibility and flow
on color Doppler imaging.

Superficial Great Saphenous Vein: No evidence of thrombus. Normal
compressibility.

Venous Reflux:  None.

Other Findings:  None.

LEFT LOWER EXTREMITY

Common Femoral Vein: No evidence of thrombus. Normal
compressibility, respiratory phasicity and response to augmentation.

Saphenofemoral Junction: No evidence of thrombus. Normal
compressibility and flow on color Doppler imaging.

Profunda Femoral Vein: No evidence of thrombus. Normal
compressibility and flow on color Doppler imaging.

Femoral Vein: No evidence of thrombus. Normal compressibility,
respiratory phasicity and response to augmentation.

Popliteal Vein: No evidence of thrombus. Normal compressibility,
respiratory phasicity and response to augmentation.

Calf Veins: No evidence of thrombus. Normal compressibility and flow
on color Doppler imaging.

Superficial Great Saphenous Vein: No evidence of thrombus. Normal
compressibility.

Venous Reflux:  None.

Other Findings:  None.
IMPRESSION: No evidence of deep venous thrombosis.

## 2019-10-04 MED ORDER — OXYCODONE HCL 5 MG PO TABS: 5.0000 mg | ORAL_TABLET | ORAL | 0 refills | Status: DC | PRN

## 2019-10-04 MED ORDER — HYDROMORPHONE HCL 4 MG PO TABS: 4.0000 mg | ORAL_TABLET | ORAL | 0 refills | Status: DC | PRN

## 2019-10-04 NOTE — Progress Notes (Signed)
Hematology and Oncology Follow Up for Telemedicine Visits  [name] [id number] 03/02/1941 79 y.o. 10/04/2019 8:38 AM [name], MDFusco, Purcell Nails, MD   I connected with [name] on 10/04/19 at 10:00 AM EST by telephone visit and verified that I am speaking with the correct person using two identifiers.   I discussed the limitations, risks, security and privacy concerns of performing an evaluation and management service by telemedicine and the availability of in-person appointments. I also discussed with the patient that there may be a patient responsible charge related to this service. The patient expressed understanding and agreed to proceed.  Other persons participating in the visit and their role in the encounter:  None  Patient's location:  Home Provider's location: Office    Principle Diagnosis: 79 year old man with bladder cancer diagnosed in 2018.  He subsequently developed stage IV in 2019 with his cancer PDL 1 positive  with CPS score over 90% and disease involvement in the hip bone as well as pulmonary involvement.   Prior Therapy:   He is status post neoadjuvant chemotherapy utilizing gemcitabine and cisplatin started in September 2018.  He received day 1 of cycle 1 on 05/02/2017 and therapy discontinued after that.  He is status post laparoscopic Cystoprostatectomy and bilateral lymphadenectomy done by Dr. Tresa Moore on 06/29/2017. Final pathology showed a T3bN0 disease.  He has 13 lymph nodes sampled without any evidence of malignancy.  He completed radiation therapy to the pelvis on January 16, 2018 under the care of Dr. Tammi Klippel.   Carboplatin and gemcitabine he is status post 6 cycles of therapy completed on 05/22/2018.  Pembrolizumab 200 mg every 3 weeks started on 06/21/2018.  He completed 8 cycles of therapy and April 2020.  Therapy discontinued because of dermatological toxicities.   Current therapy: Active surveillance.   Interim History: [name]  reports no major changes in his health.  Continues to have pain issues in his have which has been more continuous.  His hip pain is more aching dull continuous pain and exacerbated at nighttime.  He reports that the Dilaudid is helpful in severe pain and oxycodone for milder pain.  He still has some pruritus as well and is followed by allergy regarding this issue.  He still ambulating but to a lesser degree at this time.  He denies any recent falls or syncope.     Medications: I have reviewed the patient's current medications.  Current Outpatient Medications  Medication Sig Dispense Refill  . acetaminophen (TYLENOL) 500 MG tablet Take 1,000 mg by mouth every 6 (six) hours as needed for fever.     . cephALEXin (KEFLEX) 500 MG capsule Take 1 capsule (500 mg total) by mouth 4 (four) times daily. 20 capsule 0  . diphenhydrAMINE (BENADRYL) 25 MG tablet Take 25 mg by mouth every 6 (six) hours as needed for itching.    . famotidine (PEPCID) 40 MG tablet Take 1 tablet (40 mg total) by mouth daily. 30 tablet 11  . gabapentin (NEURONTIN) 300 MG capsule Take 1 capsule (300 mg total) by mouth at bedtime for 21 days. 21 capsule 0  . HYDROmorphone (DILAUDID) 4 MG tablet Take 1 tablet (4 mg total) by mouth every 4 (four) hours as needed for severe pain. 120 tablet 0  . lactose free nutrition (BOOST) LIQD Take 237 mLs by mouth 3 (three) times daily between meals.    Marland Kitchen oxyCODONE (OXY IR/ROXICODONE) 5 MG immediate release tablet Take 1-2 tablets (5-10 mg total) by mouth every 4 (  four) hours as needed for severe pain. 120 tablet 0  . predniSONE (DELTASONE) 5 MG tablet 6 tab x 1 day, 5 tab x 1 day, 4 tab x 1 day, 3 tab x 1 day, 2 tab x 1 day, 1 tab x 1 day, stop 21 tablet 0  . sulfamethoxazole-trimethoprim (BACTRIM) 400-80 MG tablet Take 1 tablet by mouth daily. 30 tablet 2  . valACYclovir (VALTREX) 1000 MG tablet Take 1 tablet (1,000 mg total) by mouth 3 (three) times daily. 21 tablet 0   Current  Facility-Administered Medications  Medication Dose Route Frequency Provider Last Rate Last Admin  . omalizumab Arvid Right) injection 300 mg  300 mg Subcutaneous Q28 days Valentina Shaggy, MD   300 mg at 09/21/19 1440     Allergies:  Allergies  Allergen Reactions  . Ciprofloxacin Hives  . Cefepime Rash       Lab Results: Lab Results  Component Value Date   WBC 8.2 09/27/2019   HGB 14.5 09/27/2019   HCT 43.3 09/27/2019   MCV 107.7 (H) 09/27/2019   PLT 201 09/27/2019     Chemistry      Component Value Date/Time   NA 141 09/27/2019 0759   NA 140 04/30/2019 1033   NA 141 05/23/2017 0828   K 4.2 09/27/2019 0759   K 4.0 05/23/2017 0828   CL 107 09/27/2019 0759   CO2 26 09/27/2019 0759   CO2 27 05/23/2017 0828   BUN 12 09/27/2019 0759   BUN 16 04/30/2019 1033   BUN 12.5 05/23/2017 0828   CREATININE 0.85 09/27/2019 0759   CREATININE 0.8 05/23/2017 0828      Component Value Date/Time   CALCIUM 8.4 (L) 09/27/2019 0759   CALCIUM 9.0 05/23/2017 0828   ALKPHOS 64 09/27/2019 0759   ALKPHOS 57 05/23/2017 0828   AST 11 (L) 09/27/2019 0759   AST 13 05/23/2017 0828   ALT 15 09/27/2019 0759   ALT 15 05/23/2017 0828   BILITOT 0.5 09/27/2019 0759   BILITOT 0.37 05/23/2017 0828       Radiological Studies: IMPRESSION: 1. Interval development of a 7 mm right upper lobe subpleural nodule. Interval thickening of a precarinal lymph node. Findings raise the possibility of metastatic disease. Recommend attention on follow-up. 2. Similar-appearing left upper lobe irregular nodule. 3. Similar-appearing destructive lesion involving the inferior right pubic ramus. 4. New small bilateral pleural effusions. Malignant effusions not excluded. 5. There is a small focal area of soft tissue along the margin of the sigmoid colon with surrounding fat stranding which may represent mild early diverticulitis. Less likely this could represent a primary colonic lesion. Recommend attention on  follow-up exam.   Impression and Plan:  79 year old man with:   1.    Stage IV bladder cancer with pulmonary and bone involvement diagnosed in 2019.Marland Kitchen  The natural course of this disease and salvage treatment options were reviewed at this time.  CT scan on September 27, 2019 was personally reviewed and discussed with the patient.  There is no other dramatic changes noted on his imaging studies that require immediate intervention.  Risks and benefits of salvage therapy utilizing restart immunotherapy versus Padcev were reviewed.  At this time I recommended continued active surveillance.  The plan is to continue with active surveillance and repeat imaging studies in 4 to 6 months.   2. Hip pain:  Related to previous metastatic disease as well as cancer treatments.  Pain medication is available to him although is becoming less effective.  I urged him to follow-up with orthopedics at [facility] where he has been seen in the past.  Possible intervention or hip surgery may be required.  3.  Goals of care: His disease is incurable although performance status remain adequate and aggressive measures are warranted.   4.  Pruritus: He has follow-up with allergy specialist and he has been off immunotherapy at this time.  5.  Follow-up:  Will be in 3 months for repeat imaging studies in 6 months.   I discussed the assessment and treatment plan with the patient. The patient was provided an opportunity to ask questions and all were answered. The patient agreed with the plan and demonstrated an understanding of the instructions.   The patient was advised to call back or seek an in-person evaluation if the symptoms worsen or if the condition fails to improve as anticipated.  I provided 20 minutes of non face-to-face telephone visit time during this encounter, and > 50% was spent on reviewing imaging studies, laboratory data and addressing treatment options.  Zola Button,  MD 10/04/2019 8:38 AM

## 2019-10-05 ENCOUNTER — Other Ambulatory Visit: Payer: Self-pay

## 2019-10-05 ENCOUNTER — Ambulatory Visit (INDEPENDENT_AMBULATORY_CARE_PROVIDER_SITE_OTHER): Payer: Medicare Other

## 2019-10-05 ENCOUNTER — Telehealth: Payer: Self-pay | Admitting: Oncology

## 2019-10-05 DIAGNOSIS — L501 Idiopathic urticaria: Secondary | ICD-10-CM | POA: Diagnosis not present

## 2019-10-05 MED ORDER — OMALIZUMAB 150 MG ~~LOC~~ SOLR
300.0000 mg | SUBCUTANEOUS | Status: DC
  Administered 2019-10-05 – 2020-07-30 (×13): 300 mg via SUBCUTANEOUS

## 2019-10-05 NOTE — Telephone Encounter (Signed)
Scheduled appt per 2/18 los.  Sent a message to HIM pool to get a calendar mailed out.

## 2019-10-08 DIAGNOSIS — Z23 Encounter for immunization: Secondary | ICD-10-CM | POA: Diagnosis not present

## 2019-10-14 DIAGNOSIS — I10 Essential (primary) hypertension: Secondary | ICD-10-CM | POA: Diagnosis not present

## 2019-10-14 DIAGNOSIS — C679 Malignant neoplasm of bladder, unspecified: Secondary | ICD-10-CM | POA: Diagnosis not present

## 2019-10-14 DIAGNOSIS — C7951 Secondary malignant neoplasm of bone: Secondary | ICD-10-CM | POA: Diagnosis not present

## 2019-10-14 DIAGNOSIS — G894 Chronic pain syndrome: Secondary | ICD-10-CM | POA: Diagnosis not present

## 2019-10-16 DIAGNOSIS — C7951 Secondary malignant neoplasm of bone: Secondary | ICD-10-CM | POA: Diagnosis not present

## 2019-10-16 DIAGNOSIS — J439 Emphysema, unspecified: Secondary | ICD-10-CM | POA: Diagnosis not present

## 2019-10-16 DIAGNOSIS — R918 Other nonspecific abnormal finding of lung field: Secondary | ICD-10-CM | POA: Diagnosis not present

## 2019-10-16 DIAGNOSIS — C679 Malignant neoplasm of bladder, unspecified: Secondary | ICD-10-CM | POA: Diagnosis not present

## 2019-10-16 DIAGNOSIS — I10 Essential (primary) hypertension: Secondary | ICD-10-CM | POA: Diagnosis not present

## 2019-10-16 DIAGNOSIS — Z6831 Body mass index (BMI) 31.0-31.9, adult: Secondary | ICD-10-CM | POA: Diagnosis not present

## 2019-10-16 DIAGNOSIS — L309 Dermatitis, unspecified: Secondary | ICD-10-CM | POA: Diagnosis not present

## 2019-10-16 DIAGNOSIS — G894 Chronic pain syndrome: Secondary | ICD-10-CM | POA: Diagnosis not present

## 2019-10-19 ENCOUNTER — Ambulatory Visit: Payer: Self-pay

## 2019-10-23 ENCOUNTER — Telehealth: Payer: Self-pay

## 2019-10-23 NOTE — Telephone Encounter (Signed)
Called patient and made him aware of Dr. Hazeline Junker response. Patient verbalized understanding.

## 2019-10-23 NOTE — Telephone Encounter (Signed)
-----   Message from [name], MD sent at 10/23/2019 12:07 PM EST ----- No more prescriptions needed.  His itching has nothing to do with shingles and he follows up with allergy regarding it.  Thanks ----- Message ----- From: [name], RN Sent: 10/23/2019  11:48 AM EST To: [name], MD  Patient called and wanted to make you aware that he continues to experience itching and he thinks he is still healing from shingles. He said [name] prescribed Prednisone and Valtrex which he has completed. I asked him if he has contacted his PCP and he said no. Patient said he also went to an allergy specialist but continues to itch. He said he wanted to make you aware to see if you think he needs another Prednisone prescription.  [name]

## 2019-10-24 ENCOUNTER — Encounter: Payer: Self-pay | Admitting: Allergy & Immunology

## 2019-10-24 ENCOUNTER — Other Ambulatory Visit: Payer: Self-pay

## 2019-10-24 ENCOUNTER — Ambulatory Visit (INDEPENDENT_AMBULATORY_CARE_PROVIDER_SITE_OTHER): Payer: Medicare Other | Admitting: Allergy & Immunology

## 2019-10-24 VITALS — BP 106/76 | HR 71 | Temp 99.1°F | Resp 18 | Ht 71.0 in

## 2019-10-24 DIAGNOSIS — L299 Pruritus, unspecified: Secondary | ICD-10-CM

## 2019-10-24 DIAGNOSIS — L501 Idiopathic urticaria: Secondary | ICD-10-CM

## 2019-10-24 NOTE — Patient Instructions (Addendum)
1. Itching and chronic urticaria (hives) - We will refer you to see an allergist at [facility], [place], or [place]. - I would love to have their input on this.  - We will refer you to see an allergist at [facility] since you go there anyway for your cancer treatments.   2. Return if symptoms worsen or fail to improve. This can be an in-person, a virtual Webex or a telephone follow up visit.    Please inform us of any Emergency Department visits, hospitalizations, or changes in symptoms. Call us before going to the ED for breathing or allergy symptoms since we might be able to fit you in for a sick visit. Feel free to contact us anytime with any questions, problems, or concerns.  It was a pleasure to see you again today!  Websites that have reliable patient information: 1. American Academy of Asthma, Allergy, and Immunology: www.aaaai.org 2. Food Allergy Research and Education (FARE): foodallergy.org 3. Mothers of Asthmatics: http://www.asthmacommunitynetwork.org 4. American College of Allergy, Asthma, and Immunology: www.acaai.org   COVID-19 Vaccine Information can be found at: ShippingScam.co.uk For questions related to vaccine distribution or appointments, please email vaccine@[email or web address] or call [phone].     "Like" [place] on Facebook and Instagram for our latest updates!        Make sure you are registered to vote! If you have moved or changed any of your contact information, you will need to get this updated before voting!  In some cases, you MAY be able to register to vote online: [email or web address]

## 2019-10-24 NOTE — Progress Notes (Signed)
FOLLOW UP  Date of Service/Encounter:  10/24/19   Assessment:   Chronic urticariawith itching -on every two week Xolair with some improvement  Metastatic bladder cancer - s/p treatment withgemcitabine and cisplatin, and pembrolizumab  Pain control with opioids   [name] continues to have itching and hives despite the use of the Xolair.  While I do not think he has given the Xolair enough time to work on an every 2-week basis, he is insisting on a second opinion which I think is totally valid.  We discussed other options for urticaria control including cyclosporine.  However, after discussing the side effect profile, he prefers to hold off.  I also discussed the possible use of Dupixent, which in case studies in my clinical experience has helped with urticaria in the setting of Xolair resistant disease.  However, Dupixent is not approved for urticaria and [name] does not have asthma or another comorbidity that would qualify him for the use of Dupixent.  Therefore, we will refer him to [facility] for an evaluation of urticaria. I still think this could be related to chronic opioid use for pain control, but he tells me stopping this is a non-starter.   Plan/Recommendations:   1. Itching and chronic urticaria (hives) - We will refer you to see an allergist at [facility], [place], or [place]. - I would love to have their input on this.  - We will refer you to see an allergist at [facility] since you go there anyway for your cancer treatments.   2. Return if symptoms worsen or fail to improve. This can be an in-person, a virtual Webex or a telephone follow up visit.  Subjective:   [name] is a 79 y.o. male presenting today for follow up of  Chief Complaint  Patient presents with  . Urticaria    [name] has a history of the following: Patient Active Problem List   Diagnosis Date Noted  . Redness and swelling of thigh 06/05/2018  . Adverse drug reaction  06/05/2018  . Bacteremia associated with intravascular line ([place]) 04/20/2018  . Febrile neutropenia ([place]) 04/13/2018  . Gram positive sepsis ([place]) 04/13/2018  . SIRS (systemic inflammatory response syndrome) ([place]) 04/12/2018  . Bacteremia due to Gram-positive bacteria 04/12/2018  . Pancytopenia ([place]) 04/12/2018  . Status post chemotherapy   . UTI (urinary tract infection) 04/11/2018  . Port-A-Cath in place 02/20/2018  . Encounter for antineoplastic chemotherapy 02/17/2018  . Sepsis due to urinary tract infection ([place]) 01/25/2018  . Sepsis secondary to UTI ([place]) 01/25/2018  . Hyperglycemia   . Goals of care, counseling/discussion 01/18/2018  . Bone metastasis ([place]) 12/29/2017  . 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 Bladder cancer ([place]) 06/28/2017  . Malignant neoplasm of urinary bladder ([place]) 04/20/2017  . COPD UNSPECIFIED 03/07/2008    History obtained from: chart review and patient.  [name] is a 79 y.o. male presenting for a follow up visit. He was last seen in February 2021.  At that time, he continued to have pruritus and urticaria.  He seemed to be getting some improvement from his monthly Xolair injections, but we increased it to every 2 weeks to see if this would provide better control.  He continued to insist that he needed to be on steroids every day, but I continue to push back regarding the side effect profile of chronic steroid use.  We continued with antihistamines 1 to 2 tablets twice daily.  In  the interim, he has continued to have breakthrough hives.  He has not gone through 6 weeks with every 2-week Xolair injections.  He had a Xolair injection at the beginning of February as well as the middle of February, so he has not really given a lot of time to work.  Regardless, three or four days ago, he broke out over his arms. He went to see Dr. Gerarda Fraction and was placed on a steroid burst and he is fine. He continues to push for chronic prednisone to control his hives.   Whenever his steroids were tapered, the hives immediately return.  He is not using his Allegra because he tells me that "it never works".  It is not clear that he is on his famotidine at all either.  He asked me if there is anything else we can do.  He is open for a referral to another evaluation.  Otherwise, there have been no changes to his past medical history, surgical history, family history, or social history.    Review of Systems  Constitutional: Negative.  Negative for chills, fever, malaise/fatigue and weight loss.  HENT: Negative.  Negative for congestion, ear discharge and ear pain.   Eyes: Negative for pain, discharge and redness.  Respiratory: Negative for cough, sputum production, shortness of breath and wheezing.   Cardiovascular: Negative.  Negative for chest pain and palpitations.  Gastrointestinal: Negative for abdominal pain, heartburn, nausea and vomiting.  Skin: Positive for itching and rash.  Neurological: Negative for dizziness and headaches.  Endo/Heme/Allergies: Negative for environmental allergies. Does not bruise/bleed easily.       Objective:   Blood pressure 106/76, pulse 71, temperature 99.1 F (37.3 C), temperature source Temporal, resp. rate 18, height 5\' 11"  (1.803 m), SpO2 99 %. Body mass index is 29 kg/m.   Physical Exam:  Physical Exam  Constitutional: He appears well-developed.  HENT:  Head: Normocephalic and atraumatic.  Right Ear: Tympanic membrane, external ear and ear canal normal.  Left Ear: Tympanic membrane and ear canal normal.  Nose: No mucosal edema, rhinorrhea, nasal deformity or septal deviation. No epistaxis. Right sinus exhibits no maxillary sinus tenderness and no frontal sinus tenderness. Left sinus exhibits no maxillary sinus tenderness and no frontal sinus tenderness.  Mouth/Throat: Uvula is midline and oropharynx is clear and moist. Mucous membranes are not pale and not dry.  Eyes: Pupils are equal, round, and reactive to  light. Conjunctivae and EOM are normal. Right eye exhibits no chemosis and no discharge. Left eye exhibits no chemosis and no discharge. Right conjunctiva is not injected. Left conjunctiva is not injected.  Cardiovascular: Normal rate, regular rhythm and normal heart sounds.  Respiratory: Effort normal and breath sounds normal. No accessory muscle usage. No tachypnea. No respiratory distress. He has no wheezes. He has no rhonchi. He has no rales. He exhibits no tenderness.  Lymphadenopathy:    He has no cervical adenopathy.  Neurological: He is alert.  Skin: No abrasion, no petechiae and no rash noted. Rash is not papular, not vesicular and not urticarial. No erythema. No pallor.  Excoriations noted on his bilateral arms.  Multiple bruises.  Psychiatric: He has a normal mood and affect.     Diagnostic studies: none     Salvatore Marvel, MD  [facility]

## 2019-10-25 ENCOUNTER — Encounter: Payer: Self-pay | Admitting: Allergy & Immunology

## 2019-10-26 ENCOUNTER — Telehealth: Payer: Self-pay

## 2019-10-26 NOTE — Telephone Encounter (Signed)
Lance Shaggy, MD  [name], [name], NT  Referral placed for second opinion at [facility] for urticaria.    [name] Can you give me a hand with this one.  Thank You :)

## 2019-10-26 NOTE — Telephone Encounter (Signed)
Thank you so much [name]

## 2019-11-09 ENCOUNTER — Telehealth: Payer: Self-pay

## 2019-11-09 MED ORDER — PREDNISONE 10 MG PO TABS: 10.0000 mg | ORAL_TABLET | Freq: Two times a day (BID) | ORAL | 0 refills | Status: DC

## 2019-11-12 NOTE — Telephone Encounter (Signed)
Patient came in to get Prednisone Dr. Ernst Bowler said to prescribe 30 day supply of 10mg  tablets 2 tablets daily.

## 2019-11-14 DIAGNOSIS — I10 Essential (primary) hypertension: Secondary | ICD-10-CM | POA: Diagnosis not present

## 2019-11-14 DIAGNOSIS — C679 Malignant neoplasm of bladder, unspecified: Secondary | ICD-10-CM | POA: Diagnosis not present

## 2019-11-14 DIAGNOSIS — C7951 Secondary malignant neoplasm of bone: Secondary | ICD-10-CM | POA: Diagnosis not present

## 2019-11-19 ENCOUNTER — Other Ambulatory Visit: Payer: Self-pay | Admitting: Oncology

## 2019-11-19 DIAGNOSIS — C679 Malignant neoplasm of bladder, unspecified: Secondary | ICD-10-CM

## 2019-11-19 MED ORDER — OXYCODONE HCL 5 MG PO TABS: 5.0000 mg | ORAL_TABLET | ORAL | 0 refills | Status: DC | PRN

## 2019-11-19 MED ORDER — HYDROMORPHONE HCL 4 MG PO TABS: 4.0000 mg | ORAL_TABLET | ORAL | 0 refills | Status: DC | PRN

## 2019-12-14 DIAGNOSIS — I10 Essential (primary) hypertension: Secondary | ICD-10-CM | POA: Diagnosis not present

## 2019-12-14 DIAGNOSIS — G894 Chronic pain syndrome: Secondary | ICD-10-CM | POA: Diagnosis not present

## 2019-12-14 DIAGNOSIS — C7951 Secondary malignant neoplasm of bone: Secondary | ICD-10-CM | POA: Diagnosis not present

## 2019-12-14 DIAGNOSIS — C679 Malignant neoplasm of bladder, unspecified: Secondary | ICD-10-CM | POA: Diagnosis not present

## 2019-12-21 ENCOUNTER — Other Ambulatory Visit: Payer: Self-pay | Admitting: Oncology

## 2019-12-21 DIAGNOSIS — L501 Idiopathic urticaria: Secondary | ICD-10-CM | POA: Diagnosis not present

## 2019-12-21 DIAGNOSIS — Z5181 Encounter for therapeutic drug level monitoring: Secondary | ICD-10-CM | POA: Diagnosis not present

## 2019-12-21 DIAGNOSIS — Z881 Allergy status to other antibiotic agents status: Secondary | ICD-10-CM | POA: Diagnosis not present

## 2019-12-21 MED ORDER — HYDROMORPHONE HCL 4 MG PO TABS: 4.0000 mg | ORAL_TABLET | ORAL | 0 refills | Status: DC | PRN

## 2019-12-25 DIAGNOSIS — C7951 Secondary malignant neoplasm of bone: Secondary | ICD-10-CM | POA: Diagnosis not present

## 2019-12-26 ENCOUNTER — Ambulatory Visit: Payer: Medicare Other | Admitting: Allergy & Immunology

## 2020-01-02 ENCOUNTER — Other Ambulatory Visit: Payer: Self-pay

## 2020-01-02 ENCOUNTER — Inpatient Hospital Stay: Payer: Medicare Other

## 2020-01-02 ENCOUNTER — Inpatient Hospital Stay: Payer: Medicare Other | Attending: Oncology | Admitting: Oncology

## 2020-01-02 VITALS — BP 172/82 | HR 79 | Temp 97.9°F | Resp 18 | Ht 71.0 in | Wt 216.1 lb

## 2020-01-02 DIAGNOSIS — Z923 Personal history of irradiation: Secondary | ICD-10-CM | POA: Insufficient documentation

## 2020-01-02 DIAGNOSIS — M25552 Pain in left hip: Secondary | ICD-10-CM | POA: Insufficient documentation

## 2020-01-02 DIAGNOSIS — C7800 Secondary malignant neoplasm of unspecified lung: Secondary | ICD-10-CM | POA: Diagnosis not present

## 2020-01-02 DIAGNOSIS — N390 Urinary tract infection, site not specified: Secondary | ICD-10-CM | POA: Insufficient documentation

## 2020-01-02 DIAGNOSIS — C7951 Secondary malignant neoplasm of bone: Secondary | ICD-10-CM | POA: Insufficient documentation

## 2020-01-02 DIAGNOSIS — C679 Malignant neoplasm of bladder, unspecified: Secondary | ICD-10-CM

## 2020-01-02 DIAGNOSIS — Z9221 Personal history of antineoplastic chemotherapy: Secondary | ICD-10-CM | POA: Diagnosis not present

## 2020-01-02 DIAGNOSIS — Z79899 Other long term (current) drug therapy: Secondary | ICD-10-CM | POA: Diagnosis not present

## 2020-01-02 LAB — CMP (CANCER CENTER ONLY)
ALT: 14 U/L (ref 0–44)
AST: 14 U/L — ABNORMAL LOW (ref 15–41)
Albumin: 2.7 g/dL — ABNORMAL LOW (ref 3.5–5.0)
Alkaline Phosphatase: 72 U/L (ref 38–126)
Anion gap: 10 (ref 5–15)
BUN: 13 mg/dL (ref 8–23)
CO2: 25 mmol/L (ref 22–32)
Calcium: 8.9 mg/dL (ref 8.9–10.3)
Chloride: 107 mmol/L (ref 98–111)
Creatinine: 0.89 mg/dL (ref 0.61–1.24)
GFR, Est AFR Am: >60 mL/min (ref >60)
GFR, Estimated: >60 mL/min (ref >60)
Glucose, Bld: 128 mg/dL — ABNORMAL HIGH (ref 70–99)
Potassium: 3.9 mmol/L (ref 3.5–5.1)
Sodium: 142 mmol/L (ref 135–145)
Total Bilirubin: 0.4 mg/dL (ref 0.3–1.2)
Total Protein: 5.9 g/dL — ABNORMAL LOW (ref 6.5–8.1)

## 2020-01-02 LAB — CBC WITH DIFFERENTIAL (CANCER CENTER ONLY)
Abs Immature Granulocytes: 0.06 10*3/uL (ref 0.00–0.07)
Basophils Absolute: 0.1 10*3/uL (ref 0.0–0.1)
Basophils Relative: 1 %
Eosinophils Absolute: 0.3 10*3/uL (ref 0.0–0.5)
Eosinophils Relative: 2 %
HCT: 43.4 % (ref 39.0–52.0)
Hemoglobin: 14.5 g/dL (ref 13.0–17.0)
Immature Granulocytes: 1 %
Lymphocytes Relative: 9 %
Lymphs Abs: 0.9 10*3/uL (ref 0.7–4.0)
MCH: 36.1 pg — ABNORMAL HIGH (ref 26.0–34.0)
MCHC: 33.4 g/dL (ref 30.0–36.0)
MCV: 108 fL — ABNORMAL HIGH (ref 80.0–100.0)
Monocytes Absolute: 1 10*3/uL (ref 0.1–1.0)
Monocytes Relative: 10 %
Neutro Abs: 8.1 10*3/uL — ABNORMAL HIGH (ref 1.7–7.7)
Neutrophils Relative %: 77 %
Platelet Count: 214 10*3/uL (ref 150–400)
RBC: 4.02 MIL/uL — ABNORMAL LOW (ref 4.22–5.81)
RDW: 12.8 % (ref 11.5–15.5)
WBC Count: 10.4 10*3/uL (ref 4.0–10.5)
nRBC: 0 % (ref 0.0–0.2)

## 2020-01-02 MED ORDER — SULFAMETHOXAZOLE-TRIMETHOPRIM 800-160 MG PO TABS: 1.0000 | ORAL_TABLET | Freq: Every day | ORAL | 3 refills | Status: DC

## 2020-01-02 MED ORDER — HYDROMORPHONE HCL 4 MG PO TABS: 4.0000 mg | ORAL_TABLET | ORAL | 0 refills | Status: DC | PRN

## 2020-01-02 NOTE — Progress Notes (Signed)
Hematology and Oncology Follow Up   [name] [id number] 06/10/1941 79 y.o. 01/02/2020 9:19 AM [name], MDFusco, Purcell Nails, MD       Principle Diagnosis: 79 year old man with stage IV bladder cancer with pulmonary and bone disease documented in 2019.  He was initially diagnosed in 2018 with tremor that is PDL 1 positive  with CPS score over 90%.   Prior Therapy:   He is status post neoadjuvant chemotherapy utilizing gemcitabine and cisplatin started in September 2018.  He received day 1 of cycle 1 on 05/02/2017 and therapy discontinued after that.  He is status post laparoscopic Cystoprostatectomy and bilateral lymphadenectomy done by Dr. Tresa Moore on 06/29/2017. Final pathology showed a T3bN0 disease.  He has 13 lymph nodes sampled without any evidence of malignancy.  He completed radiation therapy to the pelvis on January 16, 2018 under the care of Dr. Tammi Klippel.   Carboplatin and gemcitabine he is status post 6 cycles of therapy completed on 05/22/2018.  Pembrolizumab 200 mg every 3 weeks started on 06/21/2018.  He completed 8 cycles of therapy and April 2020.  Therapy discontinued because of dermatological toxicities.   Current therapy: Active surveillance.   Interim History: [name] returns today for a follow-up visit.  Since last visit, he reports no major changes in his health.  He does report left-sided hip pain which has been unchanged for the most part but manageable with Dilaudid.  He is currently using Dilaudid only and oxycodone not working.  He has been using 4 mg 3 times a day around-the-clock which has helped his pain.  He is ambulating short distances without any falls or syncope.  He is unable to play golf or any strenuous exercises.  He denies any respiratory complaints.    Medications: Reviewed without changes. Current Outpatient Medications  Medication Sig Dispense Refill  . acetaminophen (TYLENOL) 500 MG tablet Take 1,000 mg by mouth every 6 (six)  hours as needed for fever.     . cephALEXin (KEFLEX) 500 MG capsule Take 1 capsule (500 mg total) by mouth 4 (four) times daily. 20 capsule 0  . diphenhydrAMINE (BENADRYL) 25 MG tablet Take 25 mg by mouth every 6 (six) hours as needed for itching.    Marland Kitchen EPINEPHrine 0.3 mg/0.3 mL IJ SOAJ injection INJECT INTO THIGH ASRNEEDED FOR ALLERGICSREACTION.    . famotidine (PEPCID) 40 MG tablet Take 1 tablet (40 mg total) by mouth daily. 30 tablet 11  . gabapentin (NEURONTIN) 300 MG capsule Take 1 capsule (300 mg total) by mouth at bedtime for 21 days. 21 capsule 0  . HYDROmorphone (DILAUDID) 4 MG tablet Take 1 tablet (4 mg total) by mouth every 4 (four) hours as needed for severe pain. 120 tablet 0  . lactose free nutrition (BOOST) LIQD Take 237 mLs by mouth 3 (three) times daily between meals.    Marland Kitchen oxyCODONE (OXY IR/ROXICODONE) 5 MG immediate release tablet Take 1-2 tablets (5-10 mg total) by mouth every 4 (four) hours as needed for severe pain. 120 tablet 0  . predniSONE (DELTASONE) 10 MG tablet Take 1 tablet (10 mg total) by mouth 2 (two) times daily with a meal. 60 tablet 0  . predniSONE (DELTASONE) 5 MG tablet 6 tab x 1 day, 5 tab x 1 day, 4 tab x 1 day, 3 tab x 1 day, 2 tab x 1 day, 1 tab x 1 day, stop 21 tablet 0  . valACYclovir (VALTREX) 1000 MG tablet Take 1 tablet (1,000 mg total) by mouth  3 (three) times daily. 21 tablet 0   Current Facility-Administered Medications  Medication Dose Route Frequency Provider Last Rate Last Admin  . omalizumab Arvid Right) injection 300 mg  300 mg Subcutaneous Q14 Days Valentina Shaggy, MD   300 mg at 10/05/19 1348     Allergies:  Allergies  Allergen Reactions  . Ciprofloxacin Hives  . Cefepime Rash   Physical exam: Blood pressure (!) 172/82, pulse 79, temperature 97.9 F (36.6 C), resp. rate 18, height 5\' 11"  (1.803 m), weight 216 lb 1.6 oz (98 kg), SpO2 100 %.   ECOG 1  General appearance: Comfortable appearing without any discomfort Head:  Normocephalic without any trauma Oropharynx: Mucous membranes are moist and pink without any thrush or ulcers. Eyes: Pupils are equal and round reactive to light. Lymph nodes: No cervical, supraclavicular, inguinal or axillary lymphadenopathy.   Heart:regular rate and rhythm.  S1 and S2 without leg edema. Lung: Clear without any rhonchi or wheezes.  No dullness to percussion. Abdomin: Soft, nontender, nondistended with good bowel sounds.  No hepatosplenomegaly. Musculoskeletal: No joint deformity or effusion.  Full range of motion noted. Neurological: No deficits noted on motor, sensory and deep tendon reflex exam. Skin: No petechial rash or dryness.  Appeared moist.     Lab Results: Lab Results  Component Value Date   WBC 8.2 09/27/2019   HGB 14.5 09/27/2019   HCT 43.3 09/27/2019   MCV 107.7 (H) 09/27/2019   PLT 201 09/27/2019     Chemistry      Component Value Date/Time   NA 141 09/27/2019 0759   NA 140 04/30/2019 1033   NA 141 05/23/2017 0828   K 4.2 09/27/2019 0759   K 4.0 05/23/2017 0828   CL 107 09/27/2019 0759   CO2 26 09/27/2019 0759   CO2 27 05/23/2017 0828   BUN 12 09/27/2019 0759   BUN 16 04/30/2019 1033   BUN 12.5 05/23/2017 0828   CREATININE 0.85 09/27/2019 0759   CREATININE 0.8 05/23/2017 0828      Component Value Date/Time   CALCIUM 8.4 (L) 09/27/2019 0759   CALCIUM 9.0 05/23/2017 0828   ALKPHOS 64 09/27/2019 0759   ALKPHOS 57 05/23/2017 0828   AST 11 (L) 09/27/2019 0759   AST 13 05/23/2017 0828   ALT 15 09/27/2019 0759   ALT 15 05/23/2017 0828   BILITOT 0.5 09/27/2019 0759   BILITOT 0.37 05/23/2017 0828         Impression and Plan:  79 year old man with:   1.    Bladder cancer diagnosed in 2018.  He subsequently developed stage IV disease with pulmonary and bone involvement.  9  The natural course of this disease was reviewed at this time and treatment options were reiterated.  I plan to repeat imaging studies in the next 2 months and  will assess for different salvage therapy options at that time.  Restarting Pembrolizumab or Padcev would be a possibility for salvage options.   2. Hip pain:  He will continue to be on Dilaudid at this time around-the-clock.  He was offered long-acting pain medication with morphine or fentanyl and has declined.  He is currently under evaluation for potential intervention to that hip at [facility].  He had embolization in the past.  3.  Goals of care: Therapy remains palliative.  He understands if his disease will likely recur and will require salvage therapy options in the future.    4.    Recurrent UTI: He is  currently on Bactrim suppression which will be continued daily.  5.  Follow-up:  Will return in 3 months for repeat evaluation.  30  minutes were dedicated to this visit. The time was spent on reviewing laboratory data,  discussing treatment options, discussing complications related to his previous cancer and addressing pain and prognosis related issues.   Zola Button, MD 01/02/2020 9:19 AM

## 2020-01-03 ENCOUNTER — Telehealth: Payer: Self-pay | Admitting: Oncology

## 2020-01-03 NOTE — Telephone Encounter (Signed)
Scheduled appt per 5/19 los.  Spoke with pt and he is aware of the appt date and time.

## 2020-01-07 ENCOUNTER — Other Ambulatory Visit: Payer: Self-pay | Admitting: Medical

## 2020-01-15 DIAGNOSIS — C7951 Secondary malignant neoplasm of bone: Secondary | ICD-10-CM | POA: Diagnosis not present

## 2020-01-15 DIAGNOSIS — C679 Malignant neoplasm of bladder, unspecified: Secondary | ICD-10-CM | POA: Diagnosis not present

## 2020-01-18 ENCOUNTER — Encounter: Payer: Self-pay | Admitting: Family

## 2020-01-18 ENCOUNTER — Ambulatory Visit (INDEPENDENT_AMBULATORY_CARE_PROVIDER_SITE_OTHER): Payer: Medicare Other | Admitting: Family

## 2020-01-18 ENCOUNTER — Other Ambulatory Visit: Payer: Self-pay

## 2020-01-18 VITALS — BP 118/60 | HR 75

## 2020-01-18 DIAGNOSIS — L501 Idiopathic urticaria: Secondary | ICD-10-CM

## 2020-01-18 NOTE — Patient Instructions (Addendum)
Chronic urticaria Restart Xolair 300 mg every 2 weeks to help with urticaria. We will speak with our biologics coordinator to get this started again.  Please let us know if this treatment plan is not working well for you. Schedule follow up appointment in 3 months

## 2020-01-18 NOTE — Progress Notes (Signed)
[place], [address] [name] [address] Dept: [phone]  FOLLOW UP NOTE  Patient ID: [name], male    DOB: Apr 03, 1941  Age: 79 y.o. MRN: [id number] Date of Office Visit: 01/18/2020   Chief Complaint: Medication management   HPI [name] a 79 year old male who presents for follow-up of chronic idiopathic urticaria.  He was last seen on September 26, 2019 by Dr. Ernst [name].  Chronic urticaria is reported as not well controlled.  His last Xolair injection was October 05, 2019.  After going to [facility] for a second opinion, he now feels that the Xolair injections have helped his hives more than anything and that he should have given it more time to do its job.He is currently not taking any daily medications for his hives.  His hives are reported as little and occur every week, but more recently the one under his left axilla on the left side of his chest that is larger.  He started some prednisone that he had on hand and has noticed better symptom control.   Drug Allergies:  Allergies  Allergen Reactions  . Ciprofloxacin Hives  . Cefepime Rash    Physical Exam: BP 118/60 (BP Location: Right Arm, Patient Position: Sitting, Cuff Size: Normal)   Pulse 75   SpO2 96%    Physical Exam Constitutional:      Appearance: Normal appearance.  HENT:     Head: Normocephalic and atraumatic.     Right Ear: Tympanic membrane and ear canal normal.     Left Ear: Tympanic membrane, ear canal and external ear normal.     Nose: Nose normal.  Eyes:     Conjunctiva/sclera: Conjunctivae normal.  Cardiovascular:     Rate and Rhythm: Normal rate and regular rhythm.     Heart sounds: Normal heart sounds.  Pulmonary:     Effort: Pulmonary effort is normal.     Breath sounds: Normal breath sounds.     Comments: Lungs clear to auscultation. Musculoskeletal:     Cervical back: Neck supple.  Skin:    General: Skin is warm.     Comments: Raised, red, erythematous lesion  that blanches under left axilla left side of chest.  Neurological:     Mental Status: He is alert and oriented to person, place, and time.  Psychiatric:        Mood and Affect: Mood normal.        Behavior: Behavior normal.        Thought Content: Thought content normal.        Judgment: Judgment normal.         Assessment and Plan: 1. Chronic idiopathic urticaria      Chronic urticaria   Metastatic bladder cancer - s/p treatment withgemcitabine and cisplatin, and pembrolizumab  Pain control with opioids   - Restart Xolair 300 mg every 2 weeks to help with urticaria. - We will speak with our biologics coordinator to get this started again.  Please let us know if this treatment plan is not working well for you.  Schedule follow up appointment in 3 months    Thank you for the opportunity to care for this patient.  Please do not hesitate to contact me with questions.  Lance Charon, FNP [facility]      I performed a history and physical examination of the patient and discussed his management with the Nurse Practitioner. I reviewed the Nurse Practitioner's note and agree  with the documented findings and plan of care. The note in its entirety was edited by myself, including the physical exam, assessment, and plan. [name] did not seem to get what he wanted out of his referral to a tertiary care center. He is again interested in restarting his Xolair with [place]. I believe that he is [name], but we are sending this note to [name] to confirm this and get it approved once again.   On another note, he has recently been diagnosed with another metastasis on his hip. There is discussion about having this surgically removed to help with his ambulation again. He unfortunately had to give up golf again but he is hopeful that he can start playing again once this surgery is done.   Lance Marvel, MD [facility]

## 2020-01-20 ENCOUNTER — Encounter: Payer: Self-pay | Admitting: Family

## 2020-01-24 DIAGNOSIS — L501 Idiopathic urticaria: Secondary | ICD-10-CM | POA: Diagnosis not present

## 2020-01-25 ENCOUNTER — Ambulatory Visit (INDEPENDENT_AMBULATORY_CARE_PROVIDER_SITE_OTHER): Payer: Medicare Other

## 2020-01-25 ENCOUNTER — Other Ambulatory Visit: Payer: Self-pay

## 2020-01-25 DIAGNOSIS — L501 Idiopathic urticaria: Secondary | ICD-10-CM

## 2020-02-05 ENCOUNTER — Telehealth: Payer: Self-pay

## 2020-02-06 ENCOUNTER — Other Ambulatory Visit: Payer: Self-pay | Admitting: Oncology

## 2020-02-06 MED ORDER — HYDROMORPHONE HCL 4 MG PO TABS: 4.0000 mg | ORAL_TABLET | ORAL | 0 refills | Status: DC | PRN

## 2020-02-07 DIAGNOSIS — R9431 Abnormal electrocardiogram [ECG] [EKG]: Secondary | ICD-10-CM | POA: Diagnosis not present

## 2020-02-08 ENCOUNTER — Ambulatory Visit: Payer: Self-pay

## 2020-02-10 IMAGING — US US EXTREM LOW VENOUS*R*
1 series · 14 of 24 positions shown · non-contrast
Comparison: None

CLINICAL DATA: Swelling x2 days

EXAM:
RIGHT LOWER EXTREMITY VENOUS DOPPLER ULTRASOUND
TECHNIQUE: Gray-scale sonography with compression, as well as color and duplex
ultrasound, were performed to evaluate the deep venous system from
the level of the common femoral vein through the popliteal and
proximal calf veins.

[Series 1: us extrem low venous*right* · 0.10mm/px · 14 of 34 slices shown]
[im 1/34]
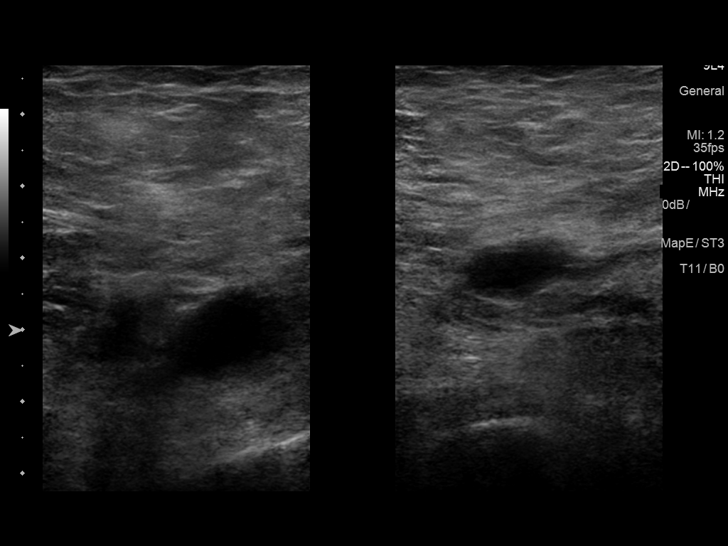
[im 3/34]
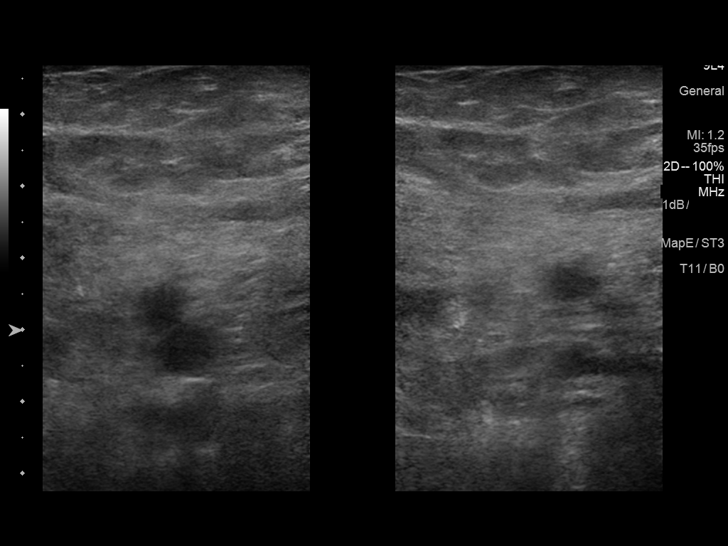
[im 6/34]
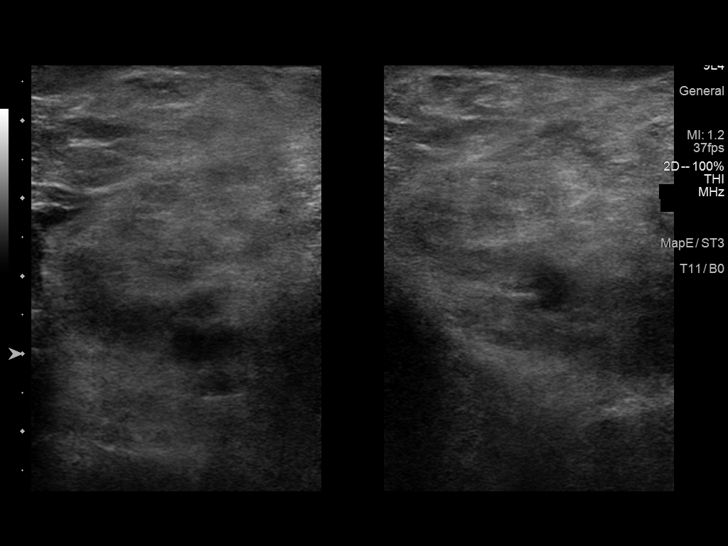
[im 9/34]
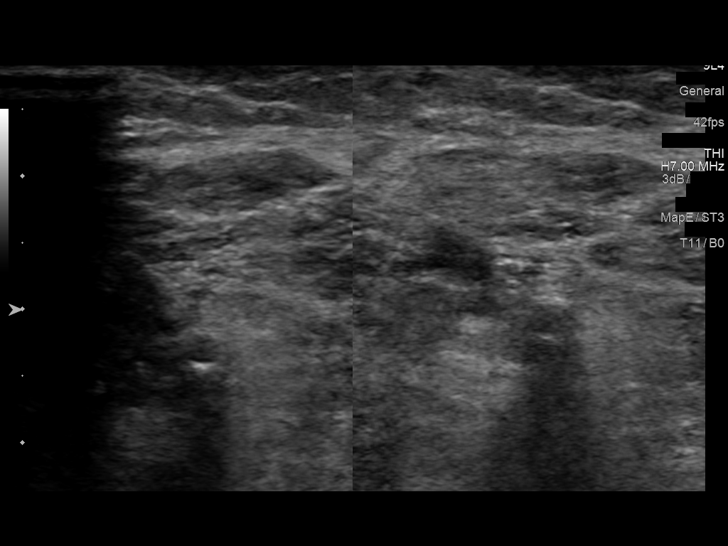
[im 11/34]
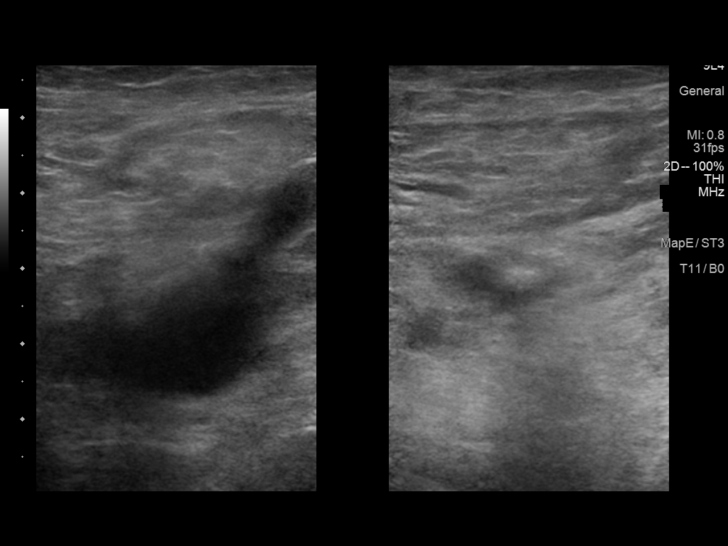
[im 13/34]
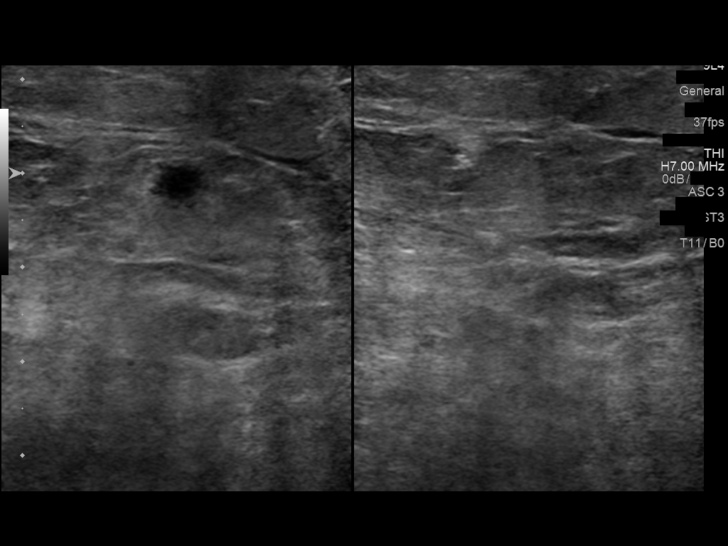
[im 16/34]
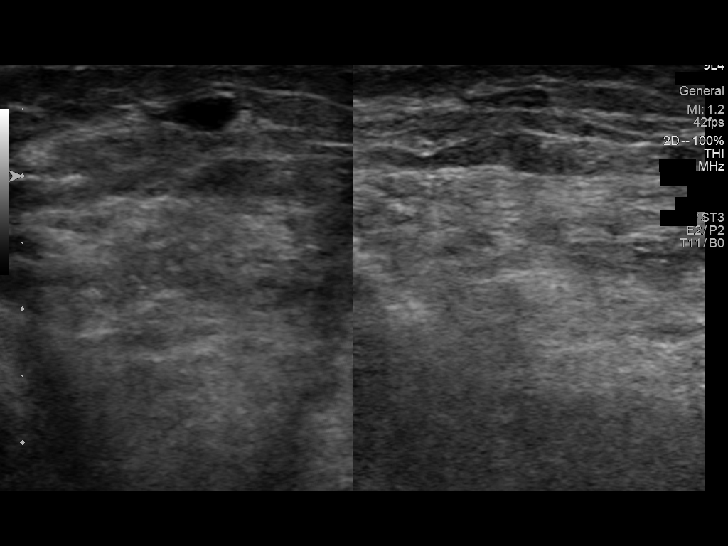
[im 18/34]
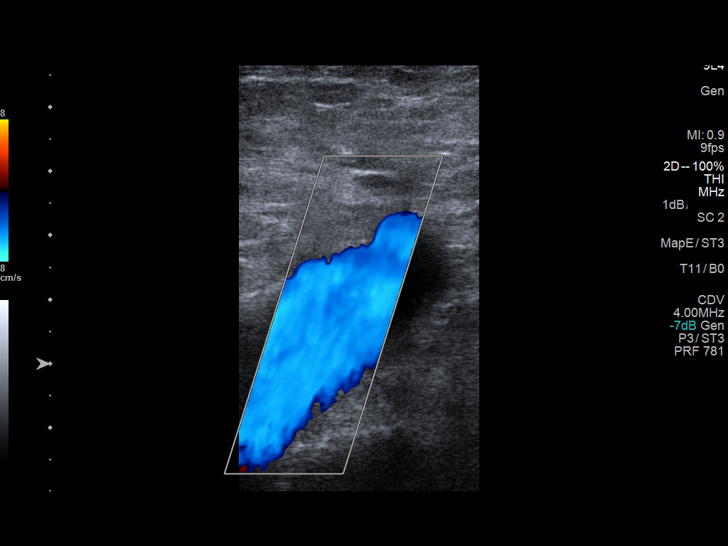
[im 21/34]
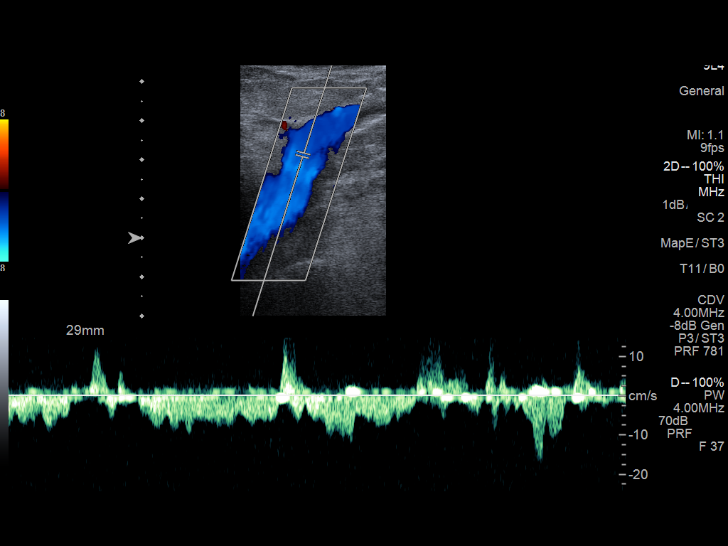
[im 23/34]
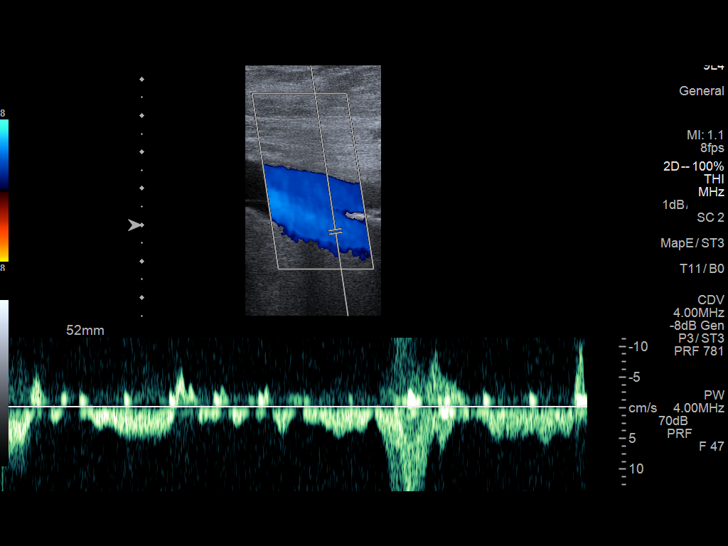
[im 26/34]
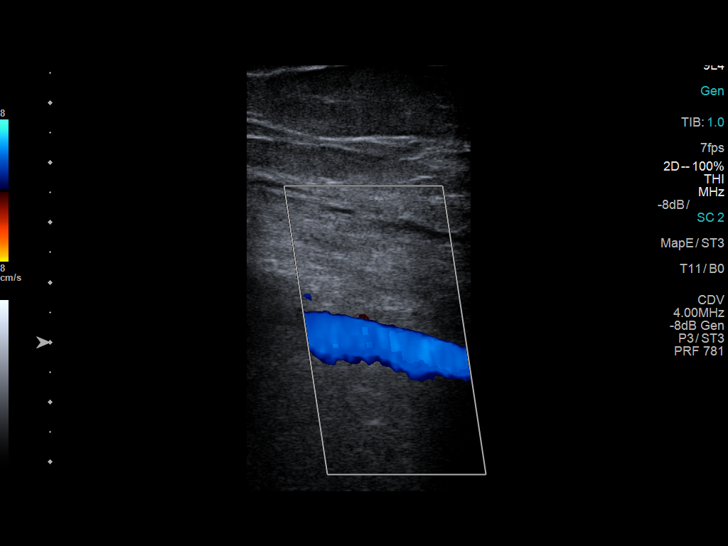
[im 28/34]
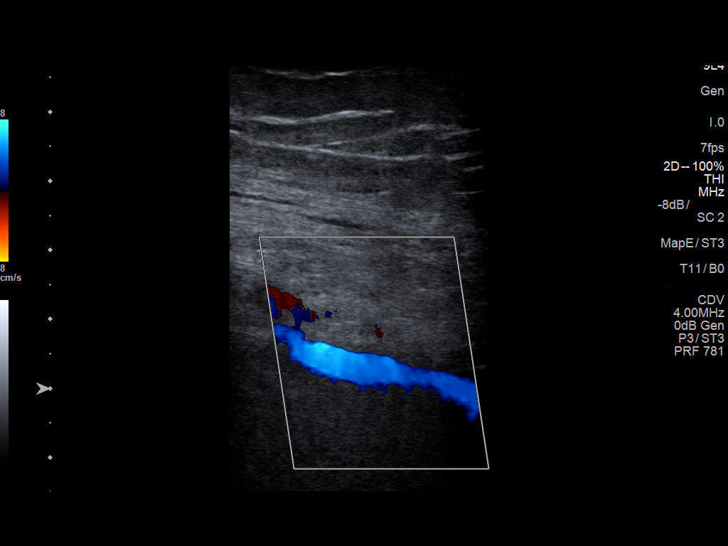
[im 31/34]
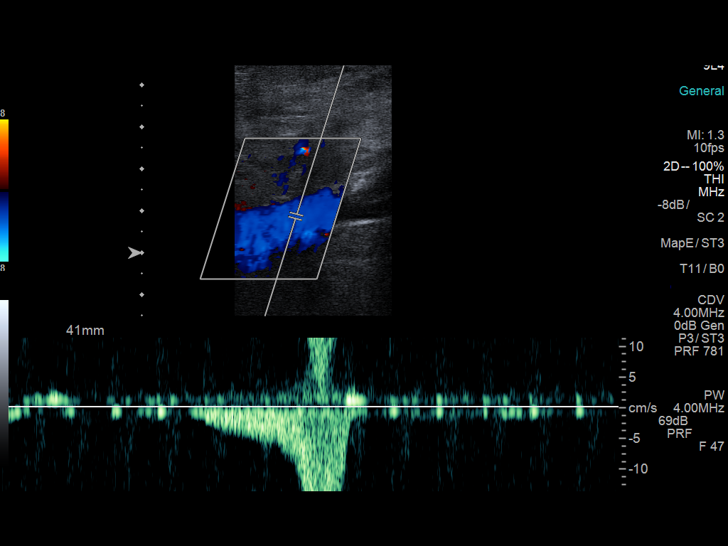
[im 34/34]
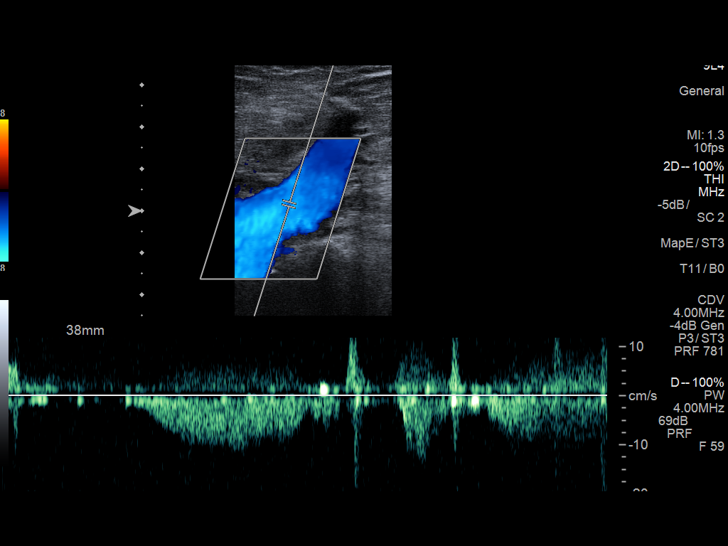

[14 of 24 positions shown; findings below may reference images not displayed]

FINDINGS: Normal compressibility of the common femoral, superficial femoral,
and popliteal veins, as well as the proximal calf veins. No filling
defects to suggest DVT on grayscale or color Doppler imaging.
Doppler waveforms show normal direction of venous flow, normal
respiratory phasicity and response to augmentation. Visualized
segments of the saphenous venous system normal in caliber and
compressibility. Survey views of the contralateral common femoral
vein are unremarkable.
IMPRESSION: No femoropopliteal and no calf DVT in the visualized calf veins. If
clinical symptoms are inconsistent or if there are persistent or
worsening symptoms, further imaging (possibly involving the iliac
veins) may be warranted.

## 2020-02-12 DIAGNOSIS — L501 Idiopathic urticaria: Secondary | ICD-10-CM | POA: Diagnosis not present

## 2020-02-13 ENCOUNTER — Ambulatory Visit (INDEPENDENT_AMBULATORY_CARE_PROVIDER_SITE_OTHER): Payer: Medicare Other

## 2020-02-13 DIAGNOSIS — I10 Essential (primary) hypertension: Secondary | ICD-10-CM | POA: Diagnosis not present

## 2020-02-13 DIAGNOSIS — C679 Malignant neoplasm of bladder, unspecified: Secondary | ICD-10-CM | POA: Diagnosis not present

## 2020-02-13 DIAGNOSIS — L501 Idiopathic urticaria: Secondary | ICD-10-CM | POA: Diagnosis not present

## 2020-02-13 DIAGNOSIS — G894 Chronic pain syndrome: Secondary | ICD-10-CM | POA: Diagnosis not present

## 2020-02-13 DIAGNOSIS — C7951 Secondary malignant neoplasm of bone: Secondary | ICD-10-CM | POA: Diagnosis not present

## 2020-02-17 ENCOUNTER — Emergency Department (HOSPITAL_COMMUNITY): Payer: Medicare Other

## 2020-02-17 ENCOUNTER — Encounter (HOSPITAL_COMMUNITY): Payer: Self-pay | Admitting: Emergency Medicine

## 2020-02-17 ENCOUNTER — Other Ambulatory Visit: Payer: Self-pay

## 2020-02-17 ENCOUNTER — Emergency Department (HOSPITAL_COMMUNITY)
Admission: EM | Admit: 2020-02-17 | Discharge: 2020-02-18 | Disposition: A | Discharge status: Home / Self Care | Payer: Medicare Other | Attending: Emergency Medicine | Admitting: Emergency Medicine

## 2020-02-17 DIAGNOSIS — Z8551 Personal history of malignant neoplasm of bladder: Secondary | ICD-10-CM | POA: Diagnosis not present

## 2020-02-17 DIAGNOSIS — Z87891 Personal history of nicotine dependence: Secondary | ICD-10-CM | POA: Insufficient documentation

## 2020-02-17 DIAGNOSIS — K5730 Diverticulosis of large intestine without perforation or abscess without bleeding: Secondary | ICD-10-CM | POA: Diagnosis not present

## 2020-02-17 DIAGNOSIS — R197 Diarrhea, unspecified: Secondary | ICD-10-CM | POA: Diagnosis not present

## 2020-02-17 DIAGNOSIS — J449 Chronic obstructive pulmonary disease, unspecified: Secondary | ICD-10-CM | POA: Diagnosis not present

## 2020-02-17 DIAGNOSIS — R5383 Other fatigue: Secondary | ICD-10-CM | POA: Insufficient documentation

## 2020-02-17 DIAGNOSIS — K429 Umbilical hernia without obstruction or gangrene: Secondary | ICD-10-CM | POA: Diagnosis not present

## 2020-02-17 DIAGNOSIS — K449 Diaphragmatic hernia without obstruction or gangrene: Secondary | ICD-10-CM | POA: Diagnosis not present

## 2020-02-17 DIAGNOSIS — Z20822 Contact with and (suspected) exposure to covid-19: Secondary | ICD-10-CM | POA: Diagnosis not present

## 2020-02-17 DIAGNOSIS — I700 Atherosclerosis of aorta: Secondary | ICD-10-CM | POA: Diagnosis not present

## 2020-02-17 DIAGNOSIS — R531 Weakness: Secondary | ICD-10-CM | POA: Diagnosis not present

## 2020-02-17 LAB — COMPREHENSIVE METABOLIC PANEL
ALT: 19 U/L (ref 0–44)
AST: 15 U/L (ref 15–41)
Albumin: 3 g/dL — ABNORMAL LOW (ref 3.5–5.0)
Alkaline Phosphatase: 55 U/L (ref 38–126)
Anion gap: 7 (ref 5–15)
BUN: 13 mg/dL (ref 8–23)
CO2: 28 mmol/L (ref 22–32)
Calcium: 8.9 mg/dL (ref 8.9–10.3)
Chloride: 106 mmol/L (ref 98–111)
Creatinine, Ser: 0.84 mg/dL (ref 0.61–1.24)
GFR calc Af Amer: >60 mL/min (ref >60)
GFR calc non Af Amer: >60 mL/min (ref >60)
Glucose, Bld: 112 mg/dL — ABNORMAL HIGH (ref 70–99)
Potassium: 3.9 mmol/L (ref 3.5–5.1)
Sodium: 141 mmol/L (ref 135–145)
Total Bilirubin: 0.5 mg/dL (ref 0.3–1.2)
Total Protein: 5.7 g/dL — ABNORMAL LOW (ref 6.5–8.1)

## 2020-02-17 LAB — CBC WITH DIFFERENTIAL/PLATELET
Abs Immature Granulocytes: 0.21 10*3/uL — ABNORMAL HIGH (ref 0.00–0.07)
Basophils Absolute: 0.1 10*3/uL (ref 0.0–0.1)
Basophils Relative: 1 %
Eosinophils Absolute: 0.1 10*3/uL (ref 0.0–0.5)
Eosinophils Relative: 0 %
HCT: 42.3 % (ref 39.0–52.0)
Hemoglobin: 13.7 g/dL (ref 13.0–17.0)
Immature Granulocytes: 2 %
Lymphocytes Relative: 5 %
Lymphs Abs: 0.7 10*3/uL (ref 0.7–4.0)
MCH: 35.7 pg — ABNORMAL HIGH (ref 26.0–34.0)
MCHC: 32.4 g/dL (ref 30.0–36.0)
MCV: 110.2 fL — ABNORMAL HIGH (ref 80.0–100.0)
Monocytes Absolute: 1.2 10*3/uL — ABNORMAL HIGH (ref 0.1–1.0)
Monocytes Relative: 9 %
Neutro Abs: 11.6 10*3/uL — ABNORMAL HIGH (ref 1.7–7.7)
Neutrophils Relative %: 83 %
Platelets: 216 10*3/uL (ref 150–400)
RBC: 3.84 MIL/uL — ABNORMAL LOW (ref 4.22–5.81)
RDW: 13.1 % (ref 11.5–15.5)
WBC: 13.8 10*3/uL — ABNORMAL HIGH (ref 4.0–10.5)
nRBC: 0 % (ref 0.0–0.2)

## 2020-02-17 LAB — URINALYSIS, ROUTINE W REFLEX MICROSCOPIC
Bacteria, UA: NONE SEEN
Bilirubin Urine: NEGATIVE
Glucose, UA: NEGATIVE mg/dL
Ketones, ur: NEGATIVE mg/dL
Leukocytes,Ua: NEGATIVE
Nitrite: NEGATIVE
Protein, ur: NEGATIVE mg/dL
Specific Gravity, Urine: 1.006 (ref 1.005–1.030)
pH: 7 (ref 5.0–8.0)

## 2020-02-17 LAB — SARS CORONAVIRUS 2 BY RT PCR (HOSPITAL ORDER, PERFORMED IN ~~LOC~~ HOSPITAL LAB): SARS Coronavirus 2: NEGATIVE

## 2020-02-17 LAB — TROPONIN I (HIGH SENSITIVITY): Troponin I (High Sensitivity): 3 ng/L (ref <18)

## 2020-02-17 MED ORDER — IOHEXOL 300 MG/ML  SOLN
100.0000 mL | Freq: Once | INTRAMUSCULAR | Status: AC | PRN
  Administered 2020-02-17: 100 mL via INTRAVENOUS

## 2020-02-17 MED ORDER — SULFAMETHOXAZOLE-TRIMETHOPRIM 800-160 MG PO TABS
1.0000 | ORAL_TABLET | Freq: Once | ORAL | Status: AC
  Administered 2020-02-18: 1 via ORAL
  Filled 2020-02-17: qty 1

## 2020-02-17 MED ORDER — SODIUM CHLORIDE 0.9% FLUSH: 3.0000 mL | Freq: Once | INTRAVENOUS | Status: DC

## 2020-02-17 MED ORDER — SULFAMETHOXAZOLE-TRIMETHOPRIM 800-160 MG PO TABS: 1.0000 | ORAL_TABLET | Freq: Two times a day (BID) | ORAL | 0 refills | Status: AC

## 2020-02-17 MED ORDER — SODIUM CHLORIDE 0.9 % IV BOLUS
1000.0000 mL | Freq: Once | INTRAVENOUS | Status: AC
  Administered 2020-02-17: 1000 mL via INTRAVENOUS

## 2020-02-17 NOTE — ED Provider Notes (Signed)
[facility] EMERGENCY DEPARTMENT Provider Note   CSN: [id number] Arrival date & time: 02/17/20  1919     History Chief Complaint  Patient presents with  . Fatigue    [name] is a 79 y.o. male history of bladder cancer status post bladder resection, urostomy bag, presented to emergency department generalized fatigue.  Patient reports generalized malaise for the past 3 weeks.  He reports loose bowel movements intermittently for 3 weeks.  He feels very weak and dehydrated.  He feels at times hot and cold but is unsure if he is running a fever.  He denies any cough or congestion.  He denies any active abdominal pain.  He has been treated multiple times for UTI in the past, twice in 2020.  He does feel that he is experiencing vaguely some of the same symptoms as he had then.  HPI     Past Medical History:  Diagnosis Date  . Bladder cancer ([place])   . BPH (benign prostatic hypertrophy)   . Dysuria   . GERD (gastroesophageal reflux disease)   . History of adenomatous polyp of colon    tubular adenoma 06/ 2018  . History of bladder cancer 12/2014   urologist-  dr [name]--  dx High Grade TCC without stromal invasion s/p TURBT and intravesical BCG tx/  recurrent 07/2015 (pTa) TCC  repear intravesical BCG tx   . History of hiatal hernia   . History of urethral stricture   . Nocturia     Patient Active Problem List   Diagnosis Date Noted  . Redness and swelling of thigh 06/05/2018  . Adverse drug reaction 06/05/2018  . Bacteremia associated with intravascular line ([place]) 04/20/2018  . Febrile neutropenia ([place]) 04/13/2018  . Gram positive sepsis ([place]) 04/13/2018  . SIRS (systemic inflammatory response syndrome) ([place]) 04/12/2018  . Bacteremia due to Gram-positive bacteria 04/12/2018  . Pancytopenia ([place]) 04/12/2018  . Status post chemotherapy   . UTI (urinary tract infection) 04/11/2018  . Port-A-Cath in place 02/20/2018  . Encounter for antineoplastic chemotherapy 02/17/2018   . Sepsis due to urinary tract infection ([place]) 01/25/2018  . Sepsis secondary to UTI ([place]) 01/25/2018  . Hyperglycemia   . Goals of care, counseling/discussion 01/18/2018  . Bone metastasis ([place]) 12/29/2017  . 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 Bladder cancer ([place]) 06/28/2017  . Malignant neoplasm of urinary bladder ([place]) 04/20/2017  . COPD UNSPECIFIED 03/07/2008    Past Surgical History:  Procedure Laterality Date  . CARDIAC CATHETERIZATION  1997   normal coronary arteries  . CARDIOVASCULAR STRESS TEST  12-14-2005   Low risk perfusion study/  very small scar in the inferior septum from mid ventricle to apex,  no ischemia/  mild inferior septal hypokinesis/  ef 58%  . CATARACT EXTRACTION W/ INTRAOCULAR LENS  IMPLANT, BILATERAL  2011  . COLONOSCOPY  last one 06/ 2018  . CYSTOSCOPY WITH BIOPSY N/A 08/12/2015   Procedure: CYSTOSCOPY WITH BIOPSY AND FULGERATION;  Surgeon: [name], MD;  Location: [facility];  Service: Urology;  Laterality: N/A;  . CYSTOSCOPY WITH INJECTION N/A 06/29/2017   Procedure: CYSTOSCOPY WITH INJECTION OF INDOCYANINE GREEN DYE;  Surgeon: Alexis Frock, MD;  Location: [facility];  Service: Urology;  Laterality: N/A;  . CYSTOSCOPY WITH URETHRAL DILATATION N/A 03/21/2017   Procedure: CYSTOSCOPY;  Surgeon: [name], MD;  Location: [facility];  Service: Urology;  Laterality: N/A;  . ESOPHAGOGASTRODUODENOSCOPY  12-05-2014  . IR IMAGING GUIDED PORT  INSERTION  02/07/2018  . IR REMOVAL TUN ACCESS W/ PORT W/O FL MOD SED  04/14/2018  . TRANSTHORACIC ECHOCARDIOGRAM  01-31-2008   nomral LVF,  ef 55-65%/  mild pulmonic stenosis without regurg.,  peak transpulomonic valve gradient 46mmHg  . TRANSURETHRAL RESECTION OF BLADDER TUMOR N/A 12/31/2014   Procedure: TRANSURETHRAL RESECTION OF BLADDER TUMOR (TURBT);  Surgeon: Lowella Bandy, MD;  Location: [facility];  Service: Urology;  Laterality: N/A;  . TRANSURETHRAL  RESECTION OF BLADDER TUMOR N/A 03/21/2017   Procedure: POSSIBLE TRANSURETHRAL RESECTION OF BLADDER TUMOR (TURBT);  Surgeon: [name], MD;  Location: [facility];  Service: Urology;  Laterality: N/A;       Family History  Problem Relation Age of Onset  . Alcoholism Father   . Colon cancer Neg Hx   . Colon polyps Neg Hx   . Diabetes Neg Hx   . Kidney disease Neg Hx   . Esophageal cancer Neg Hx   . Heart disease Neg Hx   . Gallbladder disease Neg Hx   . Allergic rhinitis Neg Hx   . Angioedema Neg Hx   . Asthma Neg Hx   . Atopy Neg Hx   . Eczema Neg Hx   . Immunodeficiency Neg Hx   . Urticaria Neg Hx     Social History   Tobacco Use  . Smoking status: Former Smoker    Packs/day: 1.00    Years: 34.00    Pack years: 34.00    Types: Cigarettes    Quit date: 12/26/1988    Years since quitting: 31.1  . Smokeless tobacco: Never Used  Vaping Use  . Vaping Use: Never used  Substance Use Topics  . Alcohol use: Yes    Alcohol/week: 6.0 standard drinks    Types: 6 Cans of beer per week    Comment: BEER  . Drug use: No    Home Medications Prior to Admission medications   Medication Sig Start Date End Date Taking? Authorizing Provider  acetaminophen (TYLENOL) 500 MG tablet Take 1,000 mg by mouth every 6 (six) hours as needed for fever.     [provider]  cephALEXin (KEFLEX) 500 MG capsule Take 1 capsule (500 mg total) by mouth 4 (four) times daily. 06/22/19   Lily Kocher, PA-C  diphenhydrAMINE (BENADRYL) 25 MG tablet Take 25 mg by mouth every 6 (six) hours as needed for itching.    [provider]  EPINEPHrine 0.3 mg/0.3 mL IJ SOAJ injection INJECT INTO THIGH ASRNEEDED FOR ALLERGICSREACTION. 07/27/19   [provider]  famotidine (PEPCID) 40 MG tablet Take 1 tablet (40 mg total) by mouth daily. 04/30/19   Ladene Artist, MD  gabapentin (NEURONTIN) 300 MG capsule Take 1 capsule (300 mg total) by mouth at bedtime for 21 days.  09/27/19 10/24/19  Tanner, Lyndon Code., PA-C  HYDROmorphone (DILAUDID) 4 MG tablet Take 1 tablet (4 mg total) by mouth every 4 (four) hours as needed for severe pain. 02/06/20   [name], MD  lactose free nutrition (BOOST) LIQD Take 237 mLs by mouth 3 (three) times daily between meals.    [provider]  oxyCODONE (OXY IR/ROXICODONE) 5 MG immediate release tablet Take 1-2 tablets (5-10 mg total) by mouth every 4 (four) hours as needed for severe pain. 11/19/19   [name], MD  predniSONE (DELTASONE) 10 MG tablet Take 1 tablet (10 mg total) by mouth 2 (two) times daily with a meal. 11/09/19   Ernst Bowler,  Gwenith Daily, MD  predniSONE (DELTASONE) 5 MG tablet 6 tab x 1 day, 5 tab x 1 day, 4 tab x 1 day, 3 tab x 1 day, 2 tab x 1 day, 1 tab x 1 day, stop 09/27/19   Harle Stanford., PA-C  sulfamethoxazole-trimethoprim (BACTRIM DS) 800-160 MG tablet Take 1 tablet by mouth daily. 01/02/20   [name], MD  sulfamethoxazole-trimethoprim (BACTRIM DS) 800-160 MG tablet Take 1 tablet by mouth 2 (two) times daily for 7 days. 02/18/20 02/25/20  Wyvonnia Dusky, MD  valACYclovir (VALTREX) 1000 MG tablet Take 1 tablet (1,000 mg total) by mouth 3 (three) times daily. 09/27/19   Tanner, Lyndon Code., PA-C    Allergies    Ciprofloxacin and Cefepime  Review of Systems   Review of Systems  Constitutional: Positive for appetite change and fatigue.  HENT: Negative for ear pain and sore throat.   Eyes: Negative for pain and visual disturbance.  Respiratory: Negative for cough and shortness of breath.   Cardiovascular: Negative for chest pain and palpitations.  Gastrointestinal: Positive for nausea. Negative for abdominal pain and vomiting.  Musculoskeletal: Negative for arthralgias and back pain.  Skin: Negative for color change and rash.  Neurological: Negative for syncope and speech difficulty.  Psychiatric/Behavioral: Negative for agitation and confusion.  All other systems reviewed and are negative.    Physical Exam Updated Vital Signs BP (!) 141/95   Pulse 80   Temp 97.8 F (36.6 C)   Resp 17   Ht 5\' 11"  (1.803 m)   Wt 98 kg   SpO2 97%   BMI 30.13 kg/m   Physical Exam Vitals and nursing note reviewed.  Constitutional:      Appearance: He is well-developed.  HENT:     Head: Normocephalic and atraumatic.  Eyes:     Conjunctiva/sclera: Conjunctivae normal.  Cardiovascular:     Rate and Rhythm: Normal rate and regular rhythm.     Pulses: Normal pulses.  Pulmonary:     Effort: Pulmonary effort is normal. No respiratory distress.     Breath sounds: Normal breath sounds.  Abdominal:     General: There is no distension.     Palpations: Abdomen is soft.     Tenderness: There is no abdominal tenderness.     Comments: Urostomy bag with clear yellow urine, no cloudiness or purulence noted  Musculoskeletal:     Cervical back: Neck supple.  Skin:    General: Skin is warm and dry.  Neurological:     General: No focal deficit present.     Mental Status: He is alert and oriented to person, place, and time.  Psychiatric:        Mood and Affect: Mood normal.        Behavior: Behavior normal.     ED Results / Procedures / Treatments   Labs (all labs ordered are listed, but only abnormal results are displayed) Labs Reviewed  URINALYSIS, ROUTINE W REFLEX MICROSCOPIC - Abnormal; Notable for the following components:      Result Value   APPearance CLOUDY (*)    Hgb urine dipstick SMALL (*)    All other components within normal limits  COMPREHENSIVE METABOLIC PANEL - Abnormal; Notable for the following components:   Glucose, Bld 112 (*)    Total Protein 5.7 (*)    Albumin 3.0 (*)    All other components within normal limits  CBC WITH DIFFERENTIAL/PLATELET - Abnormal; Notable for the following components:   WBC 13.8 (*)  RBC 3.84 (*)    MCV 110.2 (*)    MCH 35.7 (*)    Neutro Abs 11.6 (*)    Monocytes Absolute 1.2 (*)    Abs Immature Granulocytes 0.21 (*)    All other  components within normal limits  SARS CORONAVIRUS 2 BY RT PCR [facility] ORDER, [place] [facility])  URINE CULTURE  TROPONIN I (HIGH SENSITIVITY)    EKG EKG Interpretation  Date/Time:  Sunday February 17 2020 21:49:31 EDT Ventricular Rate:  81 PR Interval:    QRS Duration: 84 QT Interval:  371 QTC Calculation: 431 R Axis:   -22 Text Interpretation: Sinus rhythm Borderline left axis deviation No TEMI Confirmed by Octaviano Glow [phone]) on 02/17/2020 10:11:57 PM   Radiology CT ABDOMEN PELVIS W CONTRAST  Result Date: 02/17/2020 CLINICAL DATA:  79 year old male with abdominal pain. Concern for acute diverticulitis. History of invasive bladder carcinoma status post prior cysto prostatectomy and urinary diversion. History of chemotherapy and radiation. EXAM: CT ABDOMEN AND PELVIS WITH CONTRAST TECHNIQUE: Multidetector CT imaging of the abdomen and pelvis was performed using the standard protocol following bolus administration of intravenous contrast. CONTRAST:  170mL OMNIPAQUE IOHEXOL 300 MG/ML  SOLN COMPARISON:  CT abdomen pelvis dated 09/27/2019. FINDINGS: Lower chest: The visualized lung bases are clear. No intra-abdominal free air or free fluid. Hepatobiliary: Similar appearance of several hypodense hepatic lesions which demonstrate fluid attenuation most consistent with cysts. There may be a background of mild fatty infiltration of the liver. No intrahepatic biliary ductal dilatation. The gallbladder is unremarkable. Pancreas: Unremarkable. No pancreatic ductal dilatation or surrounding inflammatory changes. Spleen: Normal in size without focal abnormality. Adrenals/Urinary Tract: The adrenal glands unremarkable. There is no hydronephrosis on either side. There is symmetric enhancement and excretion of contrast by both kidneys. Subcentimeter right renal upper pole hypodense focus is too small to characterize. The visualized ureters appear unremarkable. The urinary bladder is  surgically absent. There is a right lower quadrant ileal conduit. Stomach/Bowel: There is a small hiatal hernia. There is a 3 cm duodenal diverticulum. Several small sigmoid diverticula without active inflammatory changes. There is no bowel obstruction or active inflammation. The appendix is not visualized with certainty. No inflammatory changes identified in the right lower quadrant. Vascular/Lymphatic: Moderate aortoiliac atherosclerotic disease. The IVC is unremarkable. No portal venous gas. There is no adenopathy. Reproductive: Prostatectomy. No mass noted in the surgical bed. Other: Small fat containing umbilical hernia. Musculoskeletal: Osteopenia with degenerative changes of the spine. Ill-defined expansile lytic mass involving the right inferior pubic ramus with bony destruction relatively similar to prior CT. This measures approximately 4.0 x 5.5 cm. No additional osseous lesion identified. No acute fracture. IMPRESSION: 1. No acute intra-abdominal or pelvic pathology. 2. Colonic diverticulosis. No bowel obstruction. 3. Status post cystoprostatectomy with right lower quadrant ileal conduit. 4. Similar appearance of destructive lesion involving the right inferior pubic ramus. 5. Aortic Atherosclerosis (ICD10-I70.0). Electronically Signed   By: Anner Crete M.D.   On: 02/17/2020 22:56   DG Chest Portable 1 View  Result Date: 02/17/2020 CLINICAL DATA:  Weakness and diarrhea EXAM: PORTABLE CHEST 1 VIEW COMPARISON:  Radiograph 03/09/2019, CT 05/22/2019 FINDINGS: No consolidation, features of edema, pneumothorax, or effusion. Pulmonary vascularity is normally distributed. The cardiomediastinal contours are unremarkable. No acute osseous or soft tissue abnormality. Degenerative changes are present in the imaged spine and shoulders. Telemetry leads overlie the chest. IMPRESSION: No acute cardiopulmonary abnormality. Electronically Signed   By: Lovena Le M.D.   On: 02/17/2020  22:03    Procedures  Procedures (including critical care time)  Medications Ordered in ED Medications  sodium chloride 0.9 % bolus 1,000 mL (0 mLs Intravenous Stopped 02/18/20 0004)  iohexol (OMNIPAQUE) 300 MG/ML solution 100 mL (100 mLs Intravenous Contrast Given 02/17/20 2203)  sulfamethoxazole-trimethoprim (BACTRIM DS) 800-160 MG per tablet 1 tablet (1 tablet Oral Given 02/18/20 0002)    ED Course  I have reviewed the triage vital signs and the nursing notes.  Pertinent labs & imaging results that were available during my care of the patient were reviewed by me and considered in my medical decision making (see chart for details).  79 yo male presenting to the ED with 3-4 weeks of general fatigue, low energy, and some intermittent loose bowel movements.  No significant diffuse diarrhea to suspect C Diff at this time.    He is overall well appearing on exam.  His vitals are wnl.  He does not have multiple SIRS criteria to suggest sepsis or significant infection.  Labs show minor leukocytosis at 13.8, unremarkable BMP, negative COVID.  UA negative leuks and nitrites.  Trop 3 and ECG is nonischemic per my interpretation - this is unlikely atypical ACS.  DG chest per my review shows no focal consolidation to suggest PNA.  CT abdomen obtained with unremarkable gallbladder, no bowel inflammation.  He has the same bony lesion of the pubic rami unchanged from prior images.  There is no significant bony tenderness, erythema, or fluctuance on palpation of the his pelvis or sacrum to suggest developing infection at these sites.  Prior medical records personally reviewed.  Clinical Course as of Feb 17 1254  Sun Feb 17, 2020  2350 I had a long discussion with the patient regarding his work-up.  There is nothing acutely abnormal in this work-up.  His UA does not show signs of infection.  His troponin is 3, with a nonischemic EKG.  I doubt this is ACS.  CT scan does not show any acute infectious findings such as diverticulitis  or colitis.  Nor pyelonephritis.  He does feel like his symptoms might resemble some of his prior UTIs, based on this history, I think is reasonable to treat for 7 days with Bactrim.  His urine cultures were susceptible to Bactrim in the past.  He otherwise does not appear dehydrated from his blood work.  We will discharge him home to monitor symptoms at home.  I do not suspect sepsis at this time.   [MT]    Clinical Course User Index [MT] Trifan, Carola Rhine, MD    Final Clinical Impression(s) / ED Diagnoses Final diagnoses:  Weakness  Other fatigue  Diarrhea, unspecified type    Rx / DC Orders ED Discharge Orders         Ordered    sulfamethoxazole-trimethoprim (BACTRIM DS) 800-160 MG tablet  2 times daily     Discontinue  Reprint     02/17/20 2350           Wyvonnia Dusky, MD 02/18/20 1255

## 2020-02-17 NOTE — Discharge Instructions (Addendum)
Your work-up today in the emergency department is reassuring.  I do not see any signs of kidney infection, diverticulitis, abdominal infection, pneumonia, or Covid infection.  Your blood work did not show signs of dehydration.  I feel it was reasonable to discharge to home.  Please follow-up with your primary care doctor about the generalized weakness you have been having for the past month.  We talked about treating you for possible urinary infection with 7 days of Bactrim.   You'll start this pill tomorrow on 02/18/20.

## 2020-02-17 NOTE — ED Triage Notes (Signed)
Pt c/o not well x 2 days. Pt states he feels weak all over and states he has had diarrhea x 3 weeks. Pt has not had any chemo in a year.

## 2020-02-17 NOTE — ED Notes (Signed)
Pt reports he feels lousy  Hx of bladder cancer and reports they went thru his buttocks Friday to take cancer from his tailbone  He also reports he feels he has an infection from this   So he drove himself here for eval   Has not had chemo in one year- now takes a pill daily   Reports he has not run a fever

## 2020-02-18 DIAGNOSIS — R5383 Other fatigue: Secondary | ICD-10-CM | POA: Diagnosis not present

## 2020-02-19 LAB — URINE CULTURE

## 2020-02-26 DIAGNOSIS — L501 Idiopathic urticaria: Secondary | ICD-10-CM | POA: Diagnosis not present

## 2020-02-27 ENCOUNTER — Ambulatory Visit (INDEPENDENT_AMBULATORY_CARE_PROVIDER_SITE_OTHER): Payer: Medicare Other

## 2020-02-27 ENCOUNTER — Telehealth: Payer: Self-pay

## 2020-02-27 ENCOUNTER — Other Ambulatory Visit: Payer: Self-pay

## 2020-02-27 DIAGNOSIS — L501 Idiopathic urticaria: Secondary | ICD-10-CM | POA: Diagnosis not present

## 2020-02-27 NOTE — Telephone Encounter (Signed)
-----   Message from [name], MD sent at 02/26/2020  4:01 PM EDT ----- I have no objections to scheduling the scan sooner. The next week will be ok Thanks ----- Message ----- From: [name], RN Sent: 02/26/2020   3:49 PM EDT To: [name], MD  Patient called and stated he hasn't been feeling well lately. He said he went to the ED on 7/4 with diarrhea and fatigue but they didn't find anything concerning. He wants to make you aware that he still feels bad and wants to know if you are ok with him scheduling his CT scan sooner than 03/25/20. [name]

## 2020-02-27 NOTE — Telephone Encounter (Signed)
Called patient and made him aware CT scan has been scheduled for Wednesday 03/05/20 at 3:00 pm with arrival at 2:45 and nothing to eat or drink 4 hours prior to the scan. Patient verbalized understanding and is aware to pick up contrast prior to scan date.

## 2020-03-03 ENCOUNTER — Telehealth: Payer: Self-pay | Admitting: Oncology

## 2020-03-03 ENCOUNTER — Other Ambulatory Visit: Payer: Self-pay | Admitting: Oncology

## 2020-03-03 MED ORDER — HYDROMORPHONE HCL 4 MG PO TABS: 4.0000 mg | ORAL_TABLET | ORAL | 0 refills | Status: DC | PRN

## 2020-03-03 NOTE — Telephone Encounter (Signed)
R/s appt per 7/19 sch msg - pt is aware of new appt date and times

## 2020-03-05 ENCOUNTER — Other Ambulatory Visit: Payer: Self-pay

## 2020-03-05 ENCOUNTER — Inpatient Hospital Stay: Payer: Medicare Other | Attending: Oncology

## 2020-03-05 ENCOUNTER — Ambulatory Visit (HOSPITAL_COMMUNITY)
Admission: RE | Admit: 2020-03-05 | Discharge: 2020-03-05 | Disposition: A | Discharge status: Home / Self Care | Payer: Medicare Other | Source: Ambulatory Visit | Attending: Oncology | Admitting: Oncology

## 2020-03-05 DIAGNOSIS — N1330 Unspecified hydronephrosis: Secondary | ICD-10-CM | POA: Diagnosis not present

## 2020-03-05 DIAGNOSIS — C679 Malignant neoplasm of bladder, unspecified: Secondary | ICD-10-CM

## 2020-03-05 DIAGNOSIS — I700 Atherosclerosis of aorta: Secondary | ICD-10-CM | POA: Diagnosis not present

## 2020-03-05 LAB — CBC WITH DIFFERENTIAL (CANCER CENTER ONLY)
Abs Immature Granulocytes: 0.12 10*3/uL — ABNORMAL HIGH (ref 0.00–0.07)
Basophils Absolute: 0.1 10*3/uL (ref 0.0–0.1)
Basophils Relative: 1 %
Eosinophils Absolute: 0.2 10*3/uL (ref 0.0–0.5)
Eosinophils Relative: 2 %
HCT: 44.1 % (ref 39.0–52.0)
Hemoglobin: 14.8 g/dL (ref 13.0–17.0)
Immature Granulocytes: 1 %
Lymphocytes Relative: 10 %
Lymphs Abs: 1.1 10*3/uL (ref 0.7–4.0)
MCH: 36.2 pg — ABNORMAL HIGH (ref 26.0–34.0)
MCHC: 33.6 g/dL (ref 30.0–36.0)
MCV: 107.8 fL — ABNORMAL HIGH (ref 80.0–100.0)
Monocytes Absolute: 1.4 10*3/uL — ABNORMAL HIGH (ref 0.1–1.0)
Monocytes Relative: 13 %
Neutro Abs: 8.3 10*3/uL — ABNORMAL HIGH (ref 1.7–7.7)
Neutrophils Relative %: 73 %
Platelet Count: 208 10*3/uL (ref 150–400)
RBC: 4.09 MIL/uL — ABNORMAL LOW (ref 4.22–5.81)
RDW: 13 % (ref 11.5–15.5)
WBC Count: 11.2 10*3/uL — ABNORMAL HIGH (ref 4.0–10.5)
nRBC: 0 % (ref 0.0–0.2)

## 2020-03-05 LAB — CMP (CANCER CENTER ONLY)
ALT: 17 U/L (ref 0–44)
AST: 13 U/L — ABNORMAL LOW (ref 15–41)
Albumin: 3 g/dL — ABNORMAL LOW (ref 3.5–5.0)
Alkaline Phosphatase: 68 U/L (ref 38–126)
Anion gap: 8 (ref 5–15)
BUN: 17 mg/dL (ref 8–23)
CO2: 25 mmol/L (ref 22–32)
Calcium: 9.9 mg/dL (ref 8.9–10.3)
Chloride: 108 mmol/L (ref 98–111)
Creatinine: 1.01 mg/dL (ref 0.61–1.24)
GFR, Est AFR Am: >60 mL/min (ref >60)
GFR, Estimated: >60 mL/min (ref >60)
Glucose, Bld: 110 mg/dL — ABNORMAL HIGH (ref 70–99)
Potassium: 4.3 mmol/L (ref 3.5–5.1)
Sodium: 141 mmol/L (ref 135–145)
Total Bilirubin: 0.4 mg/dL (ref 0.3–1.2)
Total Protein: 6.2 g/dL — ABNORMAL LOW (ref 6.5–8.1)

## 2020-03-05 MED ORDER — SODIUM CHLORIDE (PF) 0.9 % IJ SOLN
INTRAMUSCULAR | Status: AC
  Filled 2020-03-05: qty 50

## 2020-03-05 MED ORDER — SODIUM CHLORIDE 0.9 % IV SOLN
INTRAVENOUS | Status: AC
  Filled 2020-03-05: qty 250

## 2020-03-05 MED ORDER — IOHEXOL 300 MG/ML  SOLN
100.0000 mL | Freq: Once | INTRAMUSCULAR | Status: AC | PRN
  Administered 2020-03-05: 100 mL via INTRAVENOUS

## 2020-03-11 DIAGNOSIS — C7951 Secondary malignant neoplasm of bone: Secondary | ICD-10-CM | POA: Diagnosis not present

## 2020-03-11 DIAGNOSIS — L501 Idiopathic urticaria: Secondary | ICD-10-CM | POA: Diagnosis not present

## 2020-03-12 ENCOUNTER — Other Ambulatory Visit: Payer: Self-pay

## 2020-03-12 ENCOUNTER — Ambulatory Visit (INDEPENDENT_AMBULATORY_CARE_PROVIDER_SITE_OTHER): Payer: Medicare Other

## 2020-03-12 DIAGNOSIS — L501 Idiopathic urticaria: Secondary | ICD-10-CM

## 2020-03-14 DIAGNOSIS — C679 Malignant neoplasm of bladder, unspecified: Secondary | ICD-10-CM | POA: Diagnosis not present

## 2020-03-14 DIAGNOSIS — C7951 Secondary malignant neoplasm of bone: Secondary | ICD-10-CM | POA: Diagnosis not present

## 2020-03-14 DIAGNOSIS — I10 Essential (primary) hypertension: Secondary | ICD-10-CM | POA: Diagnosis not present

## 2020-03-14 DIAGNOSIS — G894 Chronic pain syndrome: Secondary | ICD-10-CM | POA: Diagnosis not present

## 2020-03-18 ENCOUNTER — Other Ambulatory Visit: Payer: Self-pay

## 2020-03-18 ENCOUNTER — Inpatient Hospital Stay: Payer: Medicare Other | Attending: Oncology | Admitting: Oncology

## 2020-03-18 VITALS — BP 158/89 | HR 95 | Temp 97.9°F | Resp 18 | Ht 71.0 in | Wt 216.3 lb

## 2020-03-18 DIAGNOSIS — M25559 Pain in unspecified hip: Secondary | ICD-10-CM | POA: Insufficient documentation

## 2020-03-18 DIAGNOSIS — C7951 Secondary malignant neoplasm of bone: Secondary | ICD-10-CM | POA: Diagnosis not present

## 2020-03-18 DIAGNOSIS — N390 Urinary tract infection, site not specified: Secondary | ICD-10-CM | POA: Diagnosis not present

## 2020-03-18 DIAGNOSIS — I700 Atherosclerosis of aorta: Secondary | ICD-10-CM | POA: Diagnosis not present

## 2020-03-18 DIAGNOSIS — C679 Malignant neoplasm of bladder, unspecified: Secondary | ICD-10-CM | POA: Diagnosis not present

## 2020-03-18 DIAGNOSIS — C7801 Secondary malignant neoplasm of right lung: Secondary | ICD-10-CM | POA: Diagnosis not present

## 2020-03-18 MED ORDER — SULFAMETHOXAZOLE-TRIMETHOPRIM 800-160 MG PO TABS: 1.0000 | ORAL_TABLET | Freq: Every day | ORAL | 3 refills | Status: DC

## 2020-03-18 MED ORDER — HYDROMORPHONE HCL 4 MG PO TABS: 4.0000 mg | ORAL_TABLET | ORAL | 0 refills | Status: DC | PRN

## 2020-03-18 NOTE — Progress Notes (Signed)
Hematology and Oncology Follow Up   [name] [id number] 16-Jan-1941 79 y.o. 03/18/2020 4:23 PM [name], MDFusco, Purcell Nails, MD       Principle Diagnosis: 79 year old man with bladder cancer diagnosed in 2018.  He subsequently developed stage IV disease with pulmonary and bone metastasis.  His tumor PDL 1 positive  with CPS score over 90% of that time.   Prior Therapy:   He is status post neoadjuvant chemotherapy utilizing gemcitabine and cisplatin started in September 2018.  He received day 1 of cycle 1 on 05/02/2017 and therapy discontinued after that.  He is status post laparoscopic Cystoprostatectomy and bilateral lymphadenectomy done by Dr. Tresa Moore on 06/29/2017. Final pathology showed a T3bN0 disease.  He has 13 lymph nodes sampled without any evidence of malignancy.  He completed radiation therapy to the pelvis on January 16, 2018 under the care of Dr. Tammi Klippel.   Carboplatin and gemcitabine he is status post 6 cycles of therapy completed on 05/22/2018.  Pembrolizumab 200 mg every 3 weeks started on 06/21/2018.  He completed 8 cycles of therapy and April 2020.  Therapy discontinued because of dermatological toxicities.   Current therapy:  Under evaluation for therapy.   Interim History: [name] is here for a follow-up evaluation.  Since the last visit, he reports no major changes in his health.  He has periodically felt weak at times without any good explanation.  He was seen in the emergency last months with his work-up being unrevealing.  He denies any specific findings of any chest pain shortness of breath.  He does report some occasional night sweats.  He denies any worsening hip pain at this time.  He does take Dilaudid which helps with his pain periodically.  He still able to ambulate and attends to activities of daily living.    Medications: Updated on review. Current Outpatient Medications  Medication Sig Dispense Refill  . acetaminophen (TYLENOL) 500 MG  tablet Take 1,000 mg by mouth every 6 (six) hours as needed for fever.     . cephALEXin (KEFLEX) 500 MG capsule Take 1 capsule (500 mg total) by mouth 4 (four) times daily. 20 capsule 0  . diphenhydrAMINE (BENADRYL) 25 MG tablet Take 25 mg by mouth every 6 (six) hours as needed for itching.    Marland Kitchen EPINEPHrine 0.3 mg/0.3 mL IJ SOAJ injection INJECT INTO THIGH ASRNEEDED FOR ALLERGICSREACTION.    . famotidine (PEPCID) 40 MG tablet Take 1 tablet (40 mg total) by mouth daily. 30 tablet 11  . gabapentin (NEURONTIN) 300 MG capsule Take 1 capsule (300 mg total) by mouth at bedtime for 21 days. 21 capsule 0  . HYDROmorphone (DILAUDID) 4 MG tablet Take 1 tablet (4 mg total) by mouth every 4 (four) hours as needed for severe pain. 120 tablet 0  . lactose free nutrition (BOOST) LIQD Take 237 mLs by mouth 3 (three) times daily between meals.    Marland Kitchen oxyCODONE (OXY IR/ROXICODONE) 5 MG immediate release tablet Take 1-2 tablets (5-10 mg total) by mouth every 4 (four) hours as needed for severe pain. 120 tablet 0  . predniSONE (DELTASONE) 10 MG tablet Take 1 tablet (10 mg total) by mouth 2 (two) times daily with a meal. 60 tablet 0  . predniSONE (DELTASONE) 5 MG tablet 6 tab x 1 day, 5 tab x 1 day, 4 tab x 1 day, 3 tab x 1 day, 2 tab x 1 day, 1 tab x 1 day, stop 21 tablet 0  . sulfamethoxazole-trimethoprim (BACTRIM DS)  800-160 MG tablet Take 1 tablet by mouth daily. 90 tablet 3  . valACYclovir (VALTREX) 1000 MG tablet Take 1 tablet (1,000 mg total) by mouth 3 (three) times daily. 21 tablet 0   Current Facility-Administered Medications  Medication Dose Route Frequency Provider Last Rate Last Admin  . omalizumab Arvid Right) injection 300 mg  300 mg Subcutaneous Q14 Days Valentina Shaggy, MD   300 mg at 03/12/20 1613     Allergies:  Allergies  Allergen Reactions  . Ciprofloxacin Hives  . Cefepime Rash   Physical exam: Blood pressure (!) 158/89, pulse 95, temperature 97.9 F (36.6 C), temperature source  Temporal, resp. rate 18, height 5\' 11"  (1.803 m), weight 216 lb 4.8 oz (98.1 kg), SpO2 100 %.   ECOG 1    General appearance: Alert, awake without any distress. Head: Atraumatic without abnormalities Oropharynx: Without any thrush or ulcers. Eyes: No scleral icterus. Lymph nodes: No lymphadenopathy noted in the cervical, supraclavicular, or axillary nodes Heart:regular rate and rhythm, without any murmurs or gallops.   Lung: Clear to auscultation without any rhonchi, wheezes or dullness to percussion. Abdomin: Soft, nontender without any shifting dullness or ascites. Musculoskeletal: No clubbing or cyanosis. Neurological: No motor or sensory deficits. Skin: No rashes or lesions.     Lab Results: Lab Results  Component Value Date   WBC 11.2 (H) 03/05/2020   HGB 14.8 03/05/2020   HCT 44.1 03/05/2020   MCV 107.8 (H) 03/05/2020   PLT 208 03/05/2020     Chemistry      Component Value Date/Time   NA 141 03/05/2020 1407   NA 140 04/30/2019 1033   NA 141 05/23/2017 0828   K 4.3 03/05/2020 1407   K 4.0 05/23/2017 0828   CL 108 03/05/2020 1407   CO2 25 03/05/2020 1407   CO2 27 05/23/2017 0828   BUN 17 03/05/2020 1407   BUN 16 04/30/2019 1033   BUN 12.5 05/23/2017 0828   CREATININE 1.01 03/05/2020 1407   CREATININE 0.8 05/23/2017 0828      Component Value Date/Time   CALCIUM 9.9 03/05/2020 1407   CALCIUM 9.0 05/23/2017 0828   ALKPHOS 68 03/05/2020 1407   ALKPHOS 57 05/23/2017 0828   AST 13 (L) 03/05/2020 1407   AST 13 05/23/2017 0828   ALT 17 03/05/2020 1407   ALT 15 05/23/2017 0828   BILITOT 0.4 03/05/2020 1407   BILITOT 0.37 05/23/2017 0828     IMPRESSION: 1. Enlarging right upper lobe pulmonary nodule compatible with enlarging neoplasm. Whether this represents a metastatic lesion or a primary bronchogenic neoplasm is uncertain. There is also an enlarged right paratracheal lymph node measuring 1.9 cm in short axis, new compared to the prior study, concerning  for nodal metastatic disease. 2. Large lytic lesion in the right inferior pubic ramus, similar to prior examinations, compatible with a metastatic lesion. 3. No other definitive findings of metastatic disease elsewhere on today's examination. 4. Status post radical cystoprostatectomy with right lower quadrant urostomy. New mild left-sided hydroureteronephrosis noted, of uncertain etiology. Attention on follow-up studies is recommended. 5. Aortic atherosclerosis, in addition to left main and 3 vessel coronary artery disease. Assessment for potential risk factor modification, dietary therapy or pharmacologic therapy may be warranted, if clinically indicated. 6. There are calcifications of the aortic valve. Echocardiographic correlation for evaluation of potential valvular dysfunction may be warranted if clinically indicated. 7. Additional incidental findings, as above.    Impression and Plan:  79 year old man with:   1.  Stage IV bladder cancer with pulmonary and bone involvement diagnosed in 2019.    He is status post therapy outlined above and has been on treatment break since April 2020 after excellent response to therapy.  CT scan on March 05, 2020 was personally reviewed and showed an enlarging right upper pulmonary nodule consistent with disease progression.  He also had an enlarged 1.9 cm right paratracheal lymph node.  Treatment options at this time were discussed.  Restarting systemic therapy so was an option but given the fact that he does not have widespread disease and based on his preference to avoid systemic therapy local therapy could be considered.  Retreatment with immunotherapy or different salvage chemotherapy such as Padcev are our options but he would like to defer them for now.  Given he has an incurable malignancy with bone disease surgical resection is not recommended.  Radiation therapy would be an option given the isolated area of oligoprogression.  I will  make referral to Dr. Tammi Klippel was treated with radiation previously.  He is agreeable with this plan.   2. Hip pain:  Manageable at this time with Dilaudid.  This will be refilled for him.  3.  Goals of care: His disease is incurable although aggressive measures are warranted at this time given his reasonable performance status.   4.    Recurrent UTI: He is on Bactrim which has suppressed his recurrent UTI.  5.  Follow-up:  In 3 months for repeat evaluation.  30  minutes were spent on this encounter.  The time was dedicated to updating his disease status, reviewing imaging studies, discussing treatment options and future plan of care review.   Zola Button, MD 03/18/2020 4:23 PM

## 2020-03-20 DIAGNOSIS — C7801 Secondary malignant neoplasm of right lung: Secondary | ICD-10-CM | POA: Diagnosis not present

## 2020-03-20 DIAGNOSIS — E6609 Other obesity due to excess calories: Secondary | ICD-10-CM | POA: Diagnosis not present

## 2020-03-20 DIAGNOSIS — C678 Malignant neoplasm of overlapping sites of bladder: Secondary | ICD-10-CM | POA: Diagnosis not present

## 2020-03-20 DIAGNOSIS — Z6830 Body mass index (BMI) 30.0-30.9, adult: Secondary | ICD-10-CM | POA: Diagnosis not present

## 2020-03-20 DIAGNOSIS — R61 Generalized hyperhidrosis: Secondary | ICD-10-CM | POA: Diagnosis not present

## 2020-03-20 DIAGNOSIS — Z936 Other artificial openings of urinary tract status: Secondary | ICD-10-CM | POA: Diagnosis not present

## 2020-03-20 DIAGNOSIS — M041 Periodic fever syndromes: Secondary | ICD-10-CM | POA: Diagnosis not present

## 2020-03-20 DIAGNOSIS — J449 Chronic obstructive pulmonary disease, unspecified: Secondary | ICD-10-CM | POA: Diagnosis not present

## 2020-03-25 ENCOUNTER — Other Ambulatory Visit: Payer: Medicare Other

## 2020-03-25 ENCOUNTER — Telehealth: Payer: Self-pay | Admitting: Oncology

## 2020-03-25 NOTE — Telephone Encounter (Signed)
Scheduled per 08/03 los, called and spoke with patient's wife. Patient will be notified of upcoming appointments.

## 2020-03-26 ENCOUNTER — Ambulatory Visit: Payer: Self-pay

## 2020-03-27 ENCOUNTER — Ambulatory Visit: Payer: Medicare Other | Admitting: Oncology

## 2020-03-28 ENCOUNTER — Ambulatory Visit
Admission: RE | Admit: 2020-03-28 | Discharge: 2020-03-28 | Disposition: A | Discharge status: Home / Self Care | Payer: Medicare Other | Source: Ambulatory Visit | Attending: Radiation Oncology | Admitting: Radiation Oncology

## 2020-03-28 ENCOUNTER — Ambulatory Visit
Admission: RE | Admit: 2020-03-28 | Discharge: 2020-03-28 | Disposition: A | Discharge status: Home / Self Care | Payer: Medicare Other | Source: Ambulatory Visit | Attending: Urology | Admitting: Urology

## 2020-03-28 ENCOUNTER — Encounter: Payer: Self-pay | Admitting: Urology

## 2020-03-28 ENCOUNTER — Other Ambulatory Visit: Payer: Self-pay

## 2020-03-28 VITALS — BP 156/81 | HR 91 | Temp 97.7°F | Resp 20 | Ht 71.0 in | Wt 221.8 lb

## 2020-03-28 DIAGNOSIS — C7951 Secondary malignant neoplasm of bone: Secondary | ICD-10-CM | POA: Insufficient documentation

## 2020-03-28 DIAGNOSIS — C7801 Secondary malignant neoplasm of right lung: Secondary | ICD-10-CM

## 2020-03-28 DIAGNOSIS — Z923 Personal history of irradiation: Secondary | ICD-10-CM | POA: Diagnosis not present

## 2020-03-28 DIAGNOSIS — Z85830 Personal history of malignant neoplasm of bone: Secondary | ICD-10-CM | POA: Diagnosis not present

## 2020-03-28 DIAGNOSIS — C770 Secondary and unspecified malignant neoplasm of lymph nodes of head, face and neck: Secondary | ICD-10-CM | POA: Diagnosis not present

## 2020-03-28 DIAGNOSIS — C679 Malignant neoplasm of bladder, unspecified: Secondary | ICD-10-CM

## 2020-03-28 NOTE — Progress Notes (Addendum)
Patient here today as a new follow-up. Patient states having a productive cough with yellow sputum. Patient denies hemoptysis. Patient denies having any shortness of breath. Patient states that he has a chest x-ray about 4 weeks ago in the emergency room. Patient states that he was referred today by Dr. Alen Blew in regards to spot seen in the left lung.   BP (!) 156/81 (BP Location: Left Arm, Patient Position: Sitting, Cuff Size: Normal)   Pulse 91   Temp 97.7 F (36.5 C)   Resp 20   Ht 5\' 11"  (1.803 m)   Wt 221 lb 12.8 oz (100.6 kg)   SpO2 98%   BMI 30.93 kg/m

## 2020-03-28 NOTE — Progress Notes (Signed)
Radiation Oncology         [phone] ________________________________  Outpatient Re-Consultation  Name: [name] MRN: [id number]  Date of Service: 03/28/2020 DOB: October 11, 1940  DT:OIZTI, Purcell Nails, MD  [name], MD   REFERRING PHYSICIAN: [name], MD  DIAGNOSIS: 79 y.o. 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    ICD-10-CM   1. Malignant neoplasm of urinary bladder, unspecified site ([place])  C67.9   2. Malignant neoplasm metastatic to right lung ([facility])  C78.01     HISTORY OF PRESENT ILLNESS: [name] is a 79 y.o. male seen at the request of Dr. Alen Blew for progressive metastatic disease in the chest.  He has a history of pT3bN0 high grade urothelial carcinoma of the bladder diagnosed initially in August 2018.  He was started on neoadjuvant chemotherapy with cisplatin and gemcitabine but discontinued after 1 cycle due to intolerability.  He proceeded with radical cystoprostatectomy with bilateral pelvic lymph node dissection with Dr. Tresa Moore on 06/29/2017.  Final pathology revealed a pT3bN0 high-grade urothelial carcinoma.  A CT A/P in April 2019 showed disease progression with bony metastasis and subcentimeter pulmonary nodules.  A CT-guided biopsy of the right ischial lesion was performed and confirmed metastatic bladder cancer.  We previously saw the patient in 12/2017 and treated him with palliative radiation to a site of painful metastasis in the right ischial bone. Following completion of the radiation, he received 6 cycles of carboplatin and gemcitabine, completed 05/22/2018  followed by 8 cycles of single agent pembrolizumab from 06/21/2018 through 11/2018, which was discontinued because of dermatological toxicities under the care of Dr. Alen Blew. He has been under observation since that time.  More recently, he underwent restaging CT C/A/P on  09/27/2019 revealing interval development of a 7 mm right upper lobe subpleural nodule and interval thickening of a precarinal lymph node. Follow up CT C/A/P performed on 03/05/2020 showed enlargement of the right upper lobe pulmonary nodule, now measuring 1.7 cm and a new enlarged right paratracheal lymph node measuring 1.9 cm.  There was a stable appearance of the right inferior pubic ramus lytic lesion and no other definitive findings of metastatic disease elsewhere.  He has reviewed the scan results with Dr. Alen Blew, and he has been kindly referred back to [place] for consideration of palliative radiation therapy to the isolated area of oligoprogression. In discussion with Dr. Alen Blew, they have opted to defer any further immunotherapy or chemotherapy at this time.   PREVIOUS RADIATION THERAPY: Yes  01/02/18 - 01/16/18: Right ischial lesion was treated to 40 Gy with 10 fx of 4 Gy  PAST MEDICAL HISTORY:  Past Medical History:  Diagnosis Date  . Bladder cancer ([place])   . BPH (benign prostatic hypertrophy)   . Dysuria   . GERD (gastroesophageal reflux disease)   . History of adenomatous polyp of colon    tubular adenoma 06/ 2018  . History of bladder cancer 12/2014   urologist-  dr [name]--  dx High Grade TCC without stromal invasion s/p TURBT and intravesical BCG tx/  recurrent 07/2015 (pTa) TCC  repear intravesical BCG tx   . History of hiatal hernia   . History of urethral stricture   . Nocturia       PAST SURGICAL HISTORY: Past Surgical History:  Procedure Laterality Date  . CARDIAC CATHETERIZATION  1997   normal coronary arteries  .  CARDIOVASCULAR STRESS TEST  12-14-2005   Low risk perfusion study/  very small scar in the inferior septum from mid ventricle to apex,  no ischemia/  mild inferior septal hypokinesis/  ef 58%  . CATARACT EXTRACTION W/ INTRAOCULAR LENS  IMPLANT, BILATERAL  2011  . COLONOSCOPY  last one 06/ 2018  . CYSTOSCOPY WITH BIOPSY N/A 08/12/2015   Procedure: CYSTOSCOPY  WITH BIOPSY AND FULGERATION;  Surgeon: [name], MD;  Location: [facility];  Service: Urology;  Laterality: N/A;  . CYSTOSCOPY WITH INJECTION N/A 06/29/2017   Procedure: CYSTOSCOPY WITH INJECTION OF INDOCYANINE GREEN DYE;  Surgeon: Alexis Frock, MD;  Location: [facility];  Service: Urology;  Laterality: N/A;  . CYSTOSCOPY WITH URETHRAL DILATATION N/A 03/21/2017   Procedure: CYSTOSCOPY;  Surgeon: [name], MD;  Location: [facility];  Service: Urology;  Laterality: N/A;  . ESOPHAGOGASTRODUODENOSCOPY  12-05-2014  . IR IMAGING GUIDED PORT INSERTION  02/07/2018  . IR REMOVAL TUN ACCESS W/ PORT W/O FL MOD SED  04/14/2018  . TRANSTHORACIC ECHOCARDIOGRAM  01-31-2008   nomral LVF,  ef 55-65%/  mild pulmonic stenosis without regurg.,  peak transpulomonic valve gradient 50mmHg  . TRANSURETHRAL RESECTION OF BLADDER TUMOR N/A 12/31/2014   Procedure: TRANSURETHRAL RESECTION OF BLADDER TUMOR (TURBT);  Surgeon: Lowella Bandy, MD;  Location: [facility];  Service: Urology;  Laterality: N/A;  . TRANSURETHRAL RESECTION OF BLADDER TUMOR N/A 03/21/2017   Procedure: POSSIBLE TRANSURETHRAL RESECTION OF BLADDER TUMOR (TURBT);  Surgeon: [name], MD;  Location[facility];  Service: Urology;  Laterality: N/A;    FAMILY HISTORY:  Family History  Problem Relation Age of Onset  . Alcoholism Father   . Colon cancer Neg Hx   . Colon polyps Neg Hx   . Diabetes Neg Hx   . Kidney disease Neg Hx   . Esophageal cancer Neg Hx   . Heart disease Neg Hx   . Gallbladder disease Neg Hx   . Allergic rhinitis Neg Hx   . Angioedema Neg Hx   . Asthma Neg Hx   . Atopy Neg Hx   . Eczema Neg Hx   . Immunodeficiency Neg Hx   . Urticaria Neg Hx     SOCIAL HISTORY:  Social History   Socioeconomic History  . Marital status: Married    Spouse name: Not on file  . Number of children: 1  . Years of education: Not on file  . Highest education  level: Not on file  Occupational History  . Occupation: Retired  Tobacco Use  . Smoking status: Former Smoker    Packs/day: 1.00    Years: 34.00    Pack years: 34.00    Types: Cigarettes    Quit date: 12/26/1988    Years since quitting: 31.2  . Smokeless tobacco: Never Used  Vaping Use  . Vaping Use: Never used  Substance and Sexual Activity  . Alcohol use: Yes    Alcohol/week: 6.0 standard drinks    Types: 6 Cans of beer per week    Comment: BEER  . Drug use: No  . Sexual activity: Never  Other Topics Concern  . Not on file  Social History Narrative  . Not on file   Social Determinants of Health   Financial Resource Strain:   . Difficulty of Paying Living Expenses:   Food Insecurity:   . Worried About Charity fundraiser in the Last Year:   . [employer]  of Food in the Last Year:   Transportation Needs:   . Film/video editor (Medical):   Marland Kitchen Lack of Transportation (Non-Medical):   Physical Activity:   . Days of Exercise per Week:   . Minutes of Exercise per Session:   Stress:   . Feeling of Stress :   Social Connections:   . Frequency of Communication with Friends and Family:   . Frequency of Social Gatherings with Friends and Family:   . Attends Religious Services:   . Active Member of Clubs or Organizations:   . Attends Archivist Meetings:   Marland Kitchen Marital Status:   Intimate Partner Violence:   . Fear of Current or Ex-Partner:   . Emotionally Abused:   Marland Kitchen Physically Abused:   . Sexually Abused:     ALLERGIES: Ciprofloxacin and Cefepime  MEDICATIONS:  Current Outpatient Medications  Medication Sig Dispense Refill  . cephALEXin (KEFLEX) 500 MG capsule Take 1 capsule (500 mg total) by mouth 4 (four) times daily. 20 capsule 0  . famotidine (PEPCID) 40 MG tablet Take 1 tablet (40 mg total) by mouth daily. 30 tablet 11  . oxyCODONE (OXY IR/ROXICODONE) 5 MG immediate release tablet Take 1-2 tablets (5-10 mg total) by mouth every 4 (four) hours as needed  for severe pain. 120 tablet 0  . acetaminophen (TYLENOL) 500 MG tablet Take 1,000 mg by mouth every 6 (six) hours as needed for fever.  (Patient not taking: Reported on 03/28/2020)    . diphenhydrAMINE (BENADRYL) 25 MG tablet Take 25 mg by mouth every 6 (six) hours as needed for itching. (Patient not taking: Reported on 03/28/2020)    . EPINEPHrine 0.3 mg/0.3 mL IJ SOAJ injection INJECT INTO THIGH ASRNEEDED FOR ALLERGICSREACTION. (Patient not taking: Reported on 03/28/2020)    . gabapentin (NEURONTIN) 300 MG capsule Take 1 capsule (300 mg total) by mouth at bedtime for 21 days. 21 capsule 0  . HYDROmorphone (DILAUDID) 4 MG tablet Take 1 tablet (4 mg total) by mouth every 4 (four) hours as needed for severe pain. (Patient not taking: Reported on 03/28/2020) 120 tablet 0  . lactose free nutrition (BOOST) LIQD Take 237 mLs by mouth 3 (three) times daily between meals. (Patient not taking: Reported on 03/28/2020)    . predniSONE (DELTASONE) 10 MG tablet Take 1 tablet (10 mg total) by mouth 2 (two) times daily with a meal. (Patient not taking: Reported on 03/28/2020) 60 tablet 0  . predniSONE (DELTASONE) 5 MG tablet 6 tab x 1 day, 5 tab x 1 day, 4 tab x 1 day, 3 tab x 1 day, 2 tab x 1 day, 1 tab x 1 day, stop (Patient not taking: Reported on 03/28/2020) 21 tablet 0  . sulfamethoxazole-trimethoprim (BACTRIM DS) 800-160 MG tablet Take 1 tablet by mouth daily. (Patient not taking: Reported on 03/28/2020) 90 tablet 3  . valACYclovir (VALTREX) 1000 MG tablet Take 1 tablet (1,000 mg total) by mouth 3 (three) times daily. (Patient not taking: Reported on 03/28/2020) 21 tablet 0   Current Facility-Administered Medications  Medication Dose Route Frequency Provider Last Rate Last Admin  . omalizumab Arvid Right) injection 300 mg  300 mg Subcutaneous Q14 Days Valentina Shaggy, MD   300 mg at 03/12/20 1613    REVIEW OF SYSTEMS:  On review of systems, the patient reports that he is doing well overall. He denies any chest  pain, increased shortness of breath, cough, fevers, chills, night sweats, or unintended weight changes. He denies any bowel or  bladder disturbances, and denies abdominal pain, nausea or vomiting. He denies any new musculoskeletal or joint aches or pains. A complete review of systems is obtained and is otherwise negative.    PHYSICAL EXAM:  Wt Readings from Last 3 Encounters:  03/28/20 221 lb 12.8 oz (100.6 kg)  03/18/20 216 lb 4.8 oz (98.1 kg)  02/17/20 216 lb (98 kg)   Temp Readings from Last 3 Encounters:  03/28/20 97.7 F (36.5 C)  03/18/20 97.9 F (36.6 C) (Temporal)  02/17/20 97.8 F (36.6 C)   BP Readings from Last 3 Encounters:  03/28/20 (!) 156/81  03/18/20 (!) 158/89  02/18/20 (!) 141/95   Pulse Readings from Last 3 Encounters:  03/28/20 91  03/18/20 95  02/18/20 80   Pain Assessment Pain Score: 4  Pain Loc: Hip (right)/10  In general this is a well appearing Caucasian male in no acute distress. He is alert and oriented x4 and appropriate throughout the examination. HEENT reveals that the patient is normocephalic, atraumatic. EOMs are intact. PERRLA. Skin is intact without any evidence of gross lesions. Cardiopulmonary assessment is negative for acute distress and he exhibits normal effort.  Abdomen has active bowel sounds in all quadrants and is intact. The abdomen is soft, non tender, non distended. Lower extremities are negative for pretibial pitting edema, deep calf tenderness, cyanosis or clubbing.   KPS = 90  100 - Normal; no complaints; no evidence of disease. 90   - Able to carry on normal activity; minor signs or symptoms of disease. 80   - Normal activity with effort; some signs or symptoms of disease. 72   - Cares for self; unable to carry on normal activity or to do active work. 60   - Requires occasional assistance, but is able to care for most of his personal needs. 50   - Requires considerable assistance and frequent medical care. 58   - Disabled;  requires special care and assistance. 63   - Severely disabled; hospital admission is indicated although death not imminent. 98   - Very sick; hospital admission necessary; active supportive treatment necessary. 10   - Moribund; fatal processes progressing rapidly. 0     - Dead  Karnofsky DA, Abelmann [place], Craver LS and Burchenal JH [phone]) The use of the nitrogen mustards in the palliative treatment of carcinoma: with particular reference to bronchogenic carcinoma Cancer 1 [id number]  LABORATORY DATA:  Lab Results  Component Value Date   WBC 11.2 (H) 03/05/2020   HGB 14.8 03/05/2020   HCT 44.1 03/05/2020   MCV 107.8 (H) 03/05/2020   PLT 208 03/05/2020   Lab Results  Component Value Date   NA 141 03/05/2020   K 4.3 03/05/2020   CL 108 03/05/2020   CO2 25 03/05/2020   Lab Results  Component Value Date   ALT 17 03/05/2020   AST 13 (L) 03/05/2020   ALKPHOS 68 03/05/2020   BILITOT 0.4 03/05/2020     RADIOGRAPHY: CT Chest W Contrast  Result Date: 03/06/2020 CLINICAL DATA:  79 year old male with history of bladder cancer diagnosed in 2016 with metastatic disease to the bones. Status post surgical resection. Chemotherapy and radiation therapy complete. Follow-up study. EXAM: CT CHEST, ABDOMEN, AND PELVIS WITH CONTRAST TECHNIQUE: Multidetector CT imaging of the chest, abdomen and pelvis was performed following the standard protocol during bolus administration of intravenous contrast. CONTRAST:  145mL OMNIPAQUE IOHEXOL 300 MG/ML  SOLN COMPARISON:  CT the chest, abdomen and pelvis 09/27/2019. CT the abdomen and pelvis  02/17/2020. FINDINGS: CT CHEST FINDINGS Cardiovascular: Heart size is normal. There is no significant pericardial fluid, thickening or pericardial calcification. There is aortic atherosclerosis, as well as atherosclerosis of the great vessels of the mediastinum and the coronary arteries, including calcified atherosclerotic plaque in the left main, left anterior descending, left  circumflex and right coronary arteries. Thickening calcification of the aortic valve. Mediastinum/Nodes: Enlarged right paratracheal lymph node measuring 1.9 cm in short axis (axial image 58 of series 3). Several other prominent but nonenlarged mediastinal and hilar lymph nodes are noted, nonspecific. Most concerning of these is an 8 mm lymph node in the superior mediastinum (axial image 37 of series 3). Small hiatal hernia. No axillary lymphadenopathy. Lungs/Pleura: Previously noted right upper lobe pulmonary nodule has significantly increased in size, currently measuring 1.7 x 1.7 x 1.6 cm (axial image 32 of series 6 and coronal image 96 of series 7), with macrolobulated and slightly spiculated margins, now in direct contact with the overlying pleura. Previously noted left upper lobe nodule appears similar to prior examinations, currently measuring 1.5 x 1.3 cm (axial image 38 of series 6). Small calcified granuloma in the right lower lobe. 3 mm left lower lobe pulmonary nodule (axial image 75 of series 6), stable compared to prior studies, considered benign. No other definite suspicious appearing pulmonary nodules or masses are noted. No acute consolidative airspace disease. No pleural effusions. Musculoskeletal: There are no aggressive appearing lytic or blastic lesions noted in the visualized portions of the skeleton. CT ABDOMEN PELVIS FINDINGS Hepatobiliary: Multiple low-attenuation lesions are again noted scattered throughout the hepatic parenchyma, compatible with cysts, largest of which is in the inferior aspect of segment 6 (axial image 34 of series 3) measuring 2.1 x 1.8 cm. Several other subcentimeter low-attenuation lesions are noted in the hepatic parenchyma, too small to characterize, but statistically likely to represent tiny cysts. No definite suspicious hepatic lesions are otherwise noted. No intra or extrahepatic biliary ductal dilatation. Gallbladder is normal in appearance. Pancreas: No  pancreatic mass. No pancreatic ductal dilatation. No pancreatic or peripancreatic fluid collections or inflammatory changes. Spleen: Unremarkable. Adrenals/Urinary Tract: Status post radical cystoprostatectomy with right lower quadrant urostomy. Mild left hydroureteronephrosis. Bilateral kidneys and adrenal glands are otherwise normal in appearance. Stomach/Bowel: Normal appearance of the stomach. Small periampullary duodenal diverticulum. No surrounding inflammatory changes to suggest an associated duodenal diverticulitis. No pathologic dilatation of small bowel or colon. Normal appendix. Vascular/Lymphatic: Aortic atherosclerosis, without evidence of aneurysm or dissection in the abdominal or pelvic vasculature. No lymphadenopathy noted in the abdomen or pelvis. Reproductive: Prostate gland is surgically absent. Other: No significant volume of ascites.  No pneumoperitoneum. Musculoskeletal: Expansile lytic lesion with associated soft tissue component measuring 6.3 x 3.8 cm, similar to the prior study. No other new osseous lesions are noted elsewhere. IMPRESSION: 1. Enlarging right upper lobe pulmonary nodule compatible with enlarging neoplasm. Whether this represents a metastatic lesion or a primary bronchogenic neoplasm is uncertain. There is also an enlarged right paratracheal lymph node measuring 1.9 cm in short axis, new compared to the prior study, concerning for nodal metastatic disease. 2. Large lytic lesion in the right inferior pubic ramus, similar to prior examinations, compatible with a metastatic lesion. 3. No other definitive findings of metastatic disease elsewhere on today's examination. 4. Status post radical cystoprostatectomy with right lower quadrant urostomy. New mild left-sided hydroureteronephrosis noted, of uncertain etiology. Attention on follow-up studies is recommended. 5. Aortic atherosclerosis, in addition to left main and 3 vessel coronary artery disease. Assessment for  potential risk  factor modification, dietary therapy or pharmacologic therapy may be warranted, if clinically indicated. 6. There are calcifications of the aortic valve. Echocardiographic correlation for evaluation of potential valvular dysfunction may be warranted if clinically indicated. 7. Additional incidental findings, as above. Electronically Signed   By: Vinnie Langton M.D.   On: 03/06/2020 10:42   CT Abdomen Pelvis W Contrast  Result Date: 03/06/2020 CLINICAL DATA:  79 year old male with history of bladder cancer diagnosed in 2016 with metastatic disease to the bones. Status post surgical resection. Chemotherapy and radiation therapy complete. Follow-up study. EXAM: CT CHEST, ABDOMEN, AND PELVIS WITH CONTRAST TECHNIQUE: Multidetector CT imaging of the chest, abdomen and pelvis was performed following the standard protocol during bolus administration of intravenous contrast. CONTRAST:  11mL OMNIPAQUE IOHEXOL 300 MG/ML  SOLN COMPARISON:  CT the chest, abdomen and pelvis 09/27/2019. CT the abdomen and pelvis 02/17/2020. FINDINGS: CT CHEST FINDINGS Cardiovascular: Heart size is normal. There is no significant pericardial fluid, thickening or pericardial calcification. There is aortic atherosclerosis, as well as atherosclerosis of the great vessels of the mediastinum and the coronary arteries, including calcified atherosclerotic plaque in the left main, left anterior descending, left circumflex and right coronary arteries. Thickening calcification of the aortic valve. Mediastinum/Nodes: Enlarged right paratracheal lymph node measuring 1.9 cm in short axis (axial image 58 of series 3). Several other prominent but nonenlarged mediastinal and hilar lymph nodes are noted, nonspecific. Most concerning of these is an 8 mm lymph node in the superior mediastinum (axial image 37 of series 3). Small hiatal hernia. No axillary lymphadenopathy. Lungs/Pleura: Previously noted right upper lobe pulmonary nodule has significantly  increased in size, currently measuring 1.7 x 1.7 x 1.6 cm (axial image 32 of series 6 and coronal image 96 of series 7), with macrolobulated and slightly spiculated margins, now in direct contact with the overlying pleura. Previously noted left upper lobe nodule appears similar to prior examinations, currently measuring 1.5 x 1.3 cm (axial image 38 of series 6). Small calcified granuloma in the right lower lobe. 3 mm left lower lobe pulmonary nodule (axial image 75 of series 6), stable compared to prior studies, considered benign. No other definite suspicious appearing pulmonary nodules or masses are noted. No acute consolidative airspace disease. No pleural effusions. Musculoskeletal: There are no aggressive appearing lytic or blastic lesions noted in the visualized portions of the skeleton. CT ABDOMEN PELVIS FINDINGS Hepatobiliary: Multiple low-attenuation lesions are again noted scattered throughout the hepatic parenchyma, compatible with cysts, largest of which is in the inferior aspect of segment 6 (axial image 34 of series 3) measuring 2.1 x 1.8 cm. Several other subcentimeter low-attenuation lesions are noted in the hepatic parenchyma, too small to characterize, but statistically likely to represent tiny cysts. No definite suspicious hepatic lesions are otherwise noted. No intra or extrahepatic biliary ductal dilatation. Gallbladder is normal in appearance. Pancreas: No pancreatic mass. No pancreatic ductal dilatation. No pancreatic or peripancreatic fluid collections or inflammatory changes. Spleen: Unremarkable. Adrenals/Urinary Tract: Status post radical cystoprostatectomy with right lower quadrant urostomy. Mild left hydroureteronephrosis. Bilateral kidneys and adrenal glands are otherwise normal in appearance. Stomach/Bowel: Normal appearance of the stomach. Small periampullary duodenal diverticulum. No surrounding inflammatory changes to suggest an associated duodenal diverticulitis. No pathologic  dilatation of small bowel or colon. Normal appendix. Vascular/Lymphatic: Aortic atherosclerosis, without evidence of aneurysm or dissection in the abdominal or pelvic vasculature. No lymphadenopathy noted in the abdomen or pelvis. Reproductive: Prostate gland is surgically absent. Other: No significant volume of ascites.  No pneumoperitoneum. Musculoskeletal: Expansile lytic lesion with associated soft tissue component measuring 6.3 x 3.8 cm, similar to the prior study. No other new osseous lesions are noted elsewhere. IMPRESSION: 1. Enlarging right upper lobe pulmonary nodule compatible with enlarging neoplasm. Whether this represents a metastatic lesion or a primary bronchogenic neoplasm is uncertain. There is also an enlarged right paratracheal lymph node measuring 1.9 cm in short axis, new compared to the prior study, concerning for nodal metastatic disease. 2. Large lytic lesion in the right inferior pubic ramus, similar to prior examinations, compatible with a metastatic lesion. 3. No other definitive findings of metastatic disease elsewhere on today's examination. 4. Status post radical cystoprostatectomy with right lower quadrant urostomy. New mild left-sided hydroureteronephrosis noted, of uncertain etiology. Attention on follow-up studies is recommended. 5. Aortic atherosclerosis, in addition to left main and 3 vessel coronary artery disease. Assessment for potential risk factor modification, dietary therapy or pharmacologic therapy may be warranted, if clinically indicated. 6. There are calcifications of the aortic valve. Echocardiographic correlation for evaluation of potential valvular dysfunction may be warranted if clinically indicated. 7. Additional incidental findings, as above. Electronically Signed   By: Vinnie Langton M.D.   On: 03/06/2020 10:42      IMPRESSION/PLAN: 1. 79 y.o. gentleman with stage IV  (pT3b, pN0) high-grade urothelial carcinoma of the bladder with disease progression in  the right upper lobe lung and a right paratracheal node. Today, we talked to the patient and his wife about the findings and workup thus far. We reviewed the recent CT imaging and discussed the natural history of metastatic bladder cancer and general treatment, highlighting the role of palliative radiotherapy in the management. We discussed the available radiation techniques, and focused on the details and logistics of delivery.  The recommendation is to proceed with a 2-week course of stereotactic body radiation-like palliative treatment targeting the enlarging nodule in the right upper lobe lung and right paratracheal node.  We reviewed the anticipated acute and late sequelae associated with radiation in this setting. The patient was encouraged to ask questions that were answered to his satisfaction.  At the end of our conversation, the patient is interested in moving forward with the recommended two week course of SBRT-like radiation therapy to the enlarging RUL nodule and right paratracheal node.  He has freely signed written consent to proceed today in the office and is scheduled for CT simulation at 9:30 AM on Tuesday, 04/01/2020 in anticipation of beginning his treatments in the near future.  We will share our discussion with Dr. Alen Blew and move forward with treatment planning accordingly.   Nicholos Johns, PA-C    Tyler Pita, MD  [facility] Direct Dial: [phone]  Fax: [phone] [email or web address]  Skype  [email or web address]   This document serves as a record of services personally performed by Tyler Pita, MD and Freeman Caldron, PA-C. It was created on their behalf by Wilburn Mylar, a trained medical scribe. The creation of this record is based on the scribe's personal observations and the provider's statements to them. This document has been checked and approved by the attending provider.

## 2020-03-31 DIAGNOSIS — C7800 Secondary malignant neoplasm of unspecified lung: Secondary | ICD-10-CM | POA: Insufficient documentation

## 2020-04-01 ENCOUNTER — Ambulatory Visit
Admission: RE | Admit: 2020-04-01 | Discharge: 2020-04-01 | Disposition: A | Discharge status: Home / Self Care | Payer: Medicare Other | Source: Ambulatory Visit | Attending: Radiation Oncology | Admitting: Radiation Oncology

## 2020-04-01 ENCOUNTER — Other Ambulatory Visit: Payer: Self-pay

## 2020-04-01 DIAGNOSIS — C771 Secondary and unspecified malignant neoplasm of intrathoracic lymph nodes: Secondary | ICD-10-CM | POA: Insufficient documentation

## 2020-04-01 DIAGNOSIS — C7801 Secondary malignant neoplasm of right lung: Secondary | ICD-10-CM | POA: Diagnosis not present

## 2020-04-01 DIAGNOSIS — Z510 Encounter for antineoplastic radiation therapy: Secondary | ICD-10-CM | POA: Diagnosis not present

## 2020-04-01 DIAGNOSIS — C7800 Secondary malignant neoplasm of unspecified lung: Secondary | ICD-10-CM

## 2020-04-01 DIAGNOSIS — C770 Secondary and unspecified malignant neoplasm of lymph nodes of head, face and neck: Secondary | ICD-10-CM | POA: Diagnosis not present

## 2020-04-03 DIAGNOSIS — L501 Idiopathic urticaria: Secondary | ICD-10-CM | POA: Diagnosis not present

## 2020-04-03 NOTE — Progress Notes (Signed)
  Radiation Oncology         [phone]) [phone] ________________________________  Name: [name] MRN: [id number]  Date: 04/01/2020  DOB: April 21, 1941  STEREOTACTIC BODY RADIOTHERAPY SIMULATION AND TREATMENT PLANNING NOTE    ICD-10-CM   1. Malignant neoplasm metastatic to lung, unspecified laterality ([place])  C78.00     DIAGNOSIS:  79 y.o. 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  NARRATIVE:  The patient was brought to the [facility].  Identity was confirmed.  All relevant records and images related to the planned course of therapy were reviewed.  The patient freely provided informed written consent to proceed with treatment after reviewing the details related to the planned course of therapy. The consent form was witnessed and verified by the simulation staff.  Then, the patient was set-up in a stable reproducible  supine position for radiation therapy.  A BodyFix immobilization pillow was fabricated for reproducible positioning.  Then I personally applied the abdominal compression paddle to limit respiratory excursion.  4D respiratoy motion management CT images were obtained.  Surface markings were placed.  The CT images were loaded into the planning software.  Then, using Cine, MIP, and standard views, the internal target volume (ITV) and planning target volumes (PTV) were delinieated, and avoidance structures were contoured.  Treatment planning then occurred.  The radiation prescription was entered and confirmed.  A total of two complex treatment devices were fabricated in the form of the BodyFix immobilization pillow and a neck accuform cushion.  I have requested : 3D Simulation  I have requested a DVH of the following structures: Heart, Lungs, Esophagus, Chest Wall, Brachial Plexus, Major Blood Vessels, and targets.  SPECIAL TREATMENT PROCEDURE:  The  planned course of therapy using radiation constitutes a special treatment procedure. Special care is required in the management of this patient for the following reasons. This treatment constitutes a Special Treatment Procedure for the following reason: [ High dose per fraction requiring special monitoring for increased toxicities of treatment including daily imaging..  The special nature of the planned course of radiotherapy will require increased physician supervision and oversight to ensure patient's safety with optimal treatment outcomes.  RESPIRATORY MOTION MANAGEMENT SIMULATION:  In order to account for effect of respiratory motion on target structures and other organs in the planning and delivery of radiotherapy, this patient underwent respiratory motion management simulation.  To accomplish this, when the patient was brought to the CT simulation planning suite, 4D respiratoy motion management CT images were obtained.  The CT images were loaded into the planning software.  Then, using a variety of tools including Cine, MIP, and standard views, the target volume and planning target volumes (PTV) were delineated.  Avoidance structures were contoured.  Treatment planning then occurred.  Dose volume histograms were generated and reviewed for each of the requested structure.  The resulting plan was carefully reviewed and approved today.  PLAN:  The patient will receive 50 Gy in 10 fractions.  ________________________________  Sheral Apley Tammi Klippel, M.D.

## 2020-04-03 NOTE — Addendum Note (Signed)
Encounter addended by: Tyler Pita, MD on: 04/03/2020 11:02 AM  Actions taken: Medication List reviewed, Problem List reviewed, Allergies reviewed, Clinical Note Signed

## 2020-04-04 ENCOUNTER — Ambulatory Visit (INDEPENDENT_AMBULATORY_CARE_PROVIDER_SITE_OTHER): Payer: Medicare Other

## 2020-04-04 ENCOUNTER — Other Ambulatory Visit: Payer: Self-pay

## 2020-04-04 DIAGNOSIS — L501 Idiopathic urticaria: Secondary | ICD-10-CM

## 2020-04-08 DIAGNOSIS — C771 Secondary and unspecified malignant neoplasm of intrathoracic lymph nodes: Secondary | ICD-10-CM | POA: Diagnosis not present

## 2020-04-08 DIAGNOSIS — C770 Secondary and unspecified malignant neoplasm of lymph nodes of head, face and neck: Secondary | ICD-10-CM | POA: Diagnosis not present

## 2020-04-08 DIAGNOSIS — C7801 Secondary malignant neoplasm of right lung: Secondary | ICD-10-CM | POA: Diagnosis not present

## 2020-04-08 DIAGNOSIS — Z510 Encounter for antineoplastic radiation therapy: Secondary | ICD-10-CM | POA: Diagnosis not present

## 2020-04-09 ENCOUNTER — Other Ambulatory Visit: Payer: Self-pay

## 2020-04-09 ENCOUNTER — Ambulatory Visit
Admission: RE | Admit: 2020-04-09 | Discharge: 2020-04-09 | Disposition: A | Discharge status: Home / Self Care | Payer: Medicare Other | Source: Ambulatory Visit | Attending: Radiation Oncology | Admitting: Radiation Oncology

## 2020-04-09 DIAGNOSIS — C7800 Secondary malignant neoplasm of unspecified lung: Secondary | ICD-10-CM

## 2020-04-09 DIAGNOSIS — Z510 Encounter for antineoplastic radiation therapy: Secondary | ICD-10-CM | POA: Diagnosis not present

## 2020-04-09 DIAGNOSIS — C771 Secondary and unspecified malignant neoplasm of intrathoracic lymph nodes: Secondary | ICD-10-CM | POA: Diagnosis not present

## 2020-04-09 DIAGNOSIS — C7801 Secondary malignant neoplasm of right lung: Secondary | ICD-10-CM | POA: Diagnosis not present

## 2020-04-09 DIAGNOSIS — C770 Secondary and unspecified malignant neoplasm of lymph nodes of head, face and neck: Secondary | ICD-10-CM | POA: Diagnosis not present

## 2020-04-10 ENCOUNTER — Other Ambulatory Visit: Payer: Self-pay

## 2020-04-10 ENCOUNTER — Ambulatory Visit
Admission: RE | Admit: 2020-04-10 | Discharge: 2020-04-10 | Disposition: A | Discharge status: Home / Self Care | Payer: Medicare Other | Source: Ambulatory Visit | Attending: Radiation Oncology | Admitting: Radiation Oncology

## 2020-04-10 DIAGNOSIS — Z510 Encounter for antineoplastic radiation therapy: Secondary | ICD-10-CM | POA: Diagnosis not present

## 2020-04-10 DIAGNOSIS — C770 Secondary and unspecified malignant neoplasm of lymph nodes of head, face and neck: Secondary | ICD-10-CM | POA: Diagnosis not present

## 2020-04-10 DIAGNOSIS — C771 Secondary and unspecified malignant neoplasm of intrathoracic lymph nodes: Secondary | ICD-10-CM | POA: Diagnosis not present

## 2020-04-10 DIAGNOSIS — C7801 Secondary malignant neoplasm of right lung: Secondary | ICD-10-CM | POA: Diagnosis not present

## 2020-04-10 DIAGNOSIS — C7800 Secondary malignant neoplasm of unspecified lung: Secondary | ICD-10-CM

## 2020-04-10 DIAGNOSIS — C7951 Secondary malignant neoplasm of bone: Secondary | ICD-10-CM

## 2020-04-11 ENCOUNTER — Ambulatory Visit
Admission: RE | Admit: 2020-04-11 | Discharge: 2020-04-11 | Disposition: A | Discharge status: Home / Self Care | Payer: Medicare Other | Source: Ambulatory Visit | Attending: Radiation Oncology | Admitting: Radiation Oncology

## 2020-04-11 ENCOUNTER — Other Ambulatory Visit: Payer: Self-pay

## 2020-04-11 DIAGNOSIS — C7800 Secondary malignant neoplasm of unspecified lung: Secondary | ICD-10-CM

## 2020-04-11 DIAGNOSIS — C770 Secondary and unspecified malignant neoplasm of lymph nodes of head, face and neck: Secondary | ICD-10-CM | POA: Diagnosis not present

## 2020-04-11 DIAGNOSIS — C7951 Secondary malignant neoplasm of bone: Secondary | ICD-10-CM

## 2020-04-11 DIAGNOSIS — C771 Secondary and unspecified malignant neoplasm of intrathoracic lymph nodes: Secondary | ICD-10-CM | POA: Diagnosis not present

## 2020-04-11 DIAGNOSIS — C7801 Secondary malignant neoplasm of right lung: Secondary | ICD-10-CM | POA: Diagnosis not present

## 2020-04-11 DIAGNOSIS — Z510 Encounter for antineoplastic radiation therapy: Secondary | ICD-10-CM | POA: Diagnosis not present

## 2020-04-14 ENCOUNTER — Other Ambulatory Visit: Payer: Self-pay

## 2020-04-14 ENCOUNTER — Ambulatory Visit
Admission: RE | Admit: 2020-04-14 | Discharge: 2020-04-14 | Disposition: A | Discharge status: Home / Self Care | Payer: Medicare Other | Source: Ambulatory Visit | Attending: Radiation Oncology | Admitting: Radiation Oncology

## 2020-04-14 DIAGNOSIS — C7800 Secondary malignant neoplasm of unspecified lung: Secondary | ICD-10-CM

## 2020-04-14 DIAGNOSIS — C7801 Secondary malignant neoplasm of right lung: Secondary | ICD-10-CM | POA: Diagnosis not present

## 2020-04-14 DIAGNOSIS — Z510 Encounter for antineoplastic radiation therapy: Secondary | ICD-10-CM | POA: Diagnosis not present

## 2020-04-14 DIAGNOSIS — C770 Secondary and unspecified malignant neoplasm of lymph nodes of head, face and neck: Secondary | ICD-10-CM | POA: Diagnosis not present

## 2020-04-14 DIAGNOSIS — C771 Secondary and unspecified malignant neoplasm of intrathoracic lymph nodes: Secondary | ICD-10-CM | POA: Diagnosis not present

## 2020-04-15 ENCOUNTER — Other Ambulatory Visit: Payer: Self-pay

## 2020-04-15 ENCOUNTER — Ambulatory Visit
Admission: RE | Admit: 2020-04-15 | Discharge: 2020-04-15 | Disposition: A | Discharge status: Home / Self Care | Payer: Medicare Other | Source: Ambulatory Visit | Attending: Radiation Oncology | Admitting: Radiation Oncology

## 2020-04-15 DIAGNOSIS — Z510 Encounter for antineoplastic radiation therapy: Secondary | ICD-10-CM | POA: Diagnosis not present

## 2020-04-15 DIAGNOSIS — C7800 Secondary malignant neoplasm of unspecified lung: Secondary | ICD-10-CM

## 2020-04-15 DIAGNOSIS — C7951 Secondary malignant neoplasm of bone: Secondary | ICD-10-CM | POA: Diagnosis not present

## 2020-04-15 DIAGNOSIS — G894 Chronic pain syndrome: Secondary | ICD-10-CM | POA: Diagnosis not present

## 2020-04-15 DIAGNOSIS — C679 Malignant neoplasm of bladder, unspecified: Secondary | ICD-10-CM | POA: Diagnosis not present

## 2020-04-15 DIAGNOSIS — I10 Essential (primary) hypertension: Secondary | ICD-10-CM | POA: Diagnosis not present

## 2020-04-15 DIAGNOSIS — C771 Secondary and unspecified malignant neoplasm of intrathoracic lymph nodes: Secondary | ICD-10-CM | POA: Diagnosis not present

## 2020-04-15 DIAGNOSIS — C7801 Secondary malignant neoplasm of right lung: Secondary | ICD-10-CM | POA: Diagnosis not present

## 2020-04-15 DIAGNOSIS — C770 Secondary and unspecified malignant neoplasm of lymph nodes of head, face and neck: Secondary | ICD-10-CM | POA: Diagnosis not present

## 2020-04-16 ENCOUNTER — Other Ambulatory Visit: Payer: Self-pay

## 2020-04-16 ENCOUNTER — Ambulatory Visit
Admission: RE | Admit: 2020-04-16 | Discharge: 2020-04-16 | Disposition: A | Discharge status: Home / Self Care | Payer: Medicare Other | Source: Ambulatory Visit | Attending: Radiation Oncology | Admitting: Radiation Oncology

## 2020-04-16 DIAGNOSIS — C771 Secondary and unspecified malignant neoplasm of intrathoracic lymph nodes: Secondary | ICD-10-CM | POA: Diagnosis not present

## 2020-04-16 DIAGNOSIS — Z510 Encounter for antineoplastic radiation therapy: Secondary | ICD-10-CM | POA: Insufficient documentation

## 2020-04-16 DIAGNOSIS — C770 Secondary and unspecified malignant neoplasm of lymph nodes of head, face and neck: Secondary | ICD-10-CM | POA: Diagnosis not present

## 2020-04-16 DIAGNOSIS — C7801 Secondary malignant neoplasm of right lung: Secondary | ICD-10-CM | POA: Diagnosis not present

## 2020-04-16 DIAGNOSIS — C7800 Secondary malignant neoplasm of unspecified lung: Secondary | ICD-10-CM

## 2020-04-17 ENCOUNTER — Other Ambulatory Visit: Payer: Self-pay

## 2020-04-17 ENCOUNTER — Ambulatory Visit
Admission: RE | Admit: 2020-04-17 | Discharge: 2020-04-17 | Disposition: A | Discharge status: Home / Self Care | Payer: Medicare Other | Source: Ambulatory Visit | Attending: Radiation Oncology | Admitting: Radiation Oncology

## 2020-04-17 DIAGNOSIS — Z510 Encounter for antineoplastic radiation therapy: Secondary | ICD-10-CM | POA: Diagnosis not present

## 2020-04-17 DIAGNOSIS — C771 Secondary and unspecified malignant neoplasm of intrathoracic lymph nodes: Secondary | ICD-10-CM | POA: Diagnosis not present

## 2020-04-17 DIAGNOSIS — L501 Idiopathic urticaria: Secondary | ICD-10-CM | POA: Diagnosis not present

## 2020-04-17 DIAGNOSIS — C7800 Secondary malignant neoplasm of unspecified lung: Secondary | ICD-10-CM

## 2020-04-17 DIAGNOSIS — C770 Secondary and unspecified malignant neoplasm of lymph nodes of head, face and neck: Secondary | ICD-10-CM | POA: Diagnosis not present

## 2020-04-17 DIAGNOSIS — C7801 Secondary malignant neoplasm of right lung: Secondary | ICD-10-CM | POA: Diagnosis not present

## 2020-04-18 ENCOUNTER — Other Ambulatory Visit: Payer: Self-pay | Admitting: Hematology and Oncology

## 2020-04-18 ENCOUNTER — Other Ambulatory Visit: Payer: Self-pay

## 2020-04-18 ENCOUNTER — Ambulatory Visit (INDEPENDENT_AMBULATORY_CARE_PROVIDER_SITE_OTHER): Payer: Medicare Other

## 2020-04-18 ENCOUNTER — Ambulatory Visit
Admission: RE | Admit: 2020-04-18 | Discharge: 2020-04-18 | Disposition: A | Discharge status: Home / Self Care | Payer: Medicare Other | Source: Ambulatory Visit | Attending: Radiation Oncology | Admitting: Radiation Oncology

## 2020-04-18 DIAGNOSIS — L501 Idiopathic urticaria: Secondary | ICD-10-CM

## 2020-04-18 DIAGNOSIS — C7800 Secondary malignant neoplasm of unspecified lung: Secondary | ICD-10-CM

## 2020-04-18 DIAGNOSIS — C771 Secondary and unspecified malignant neoplasm of intrathoracic lymph nodes: Secondary | ICD-10-CM | POA: Diagnosis not present

## 2020-04-18 DIAGNOSIS — Z510 Encounter for antineoplastic radiation therapy: Secondary | ICD-10-CM | POA: Diagnosis not present

## 2020-04-18 DIAGNOSIS — C770 Secondary and unspecified malignant neoplasm of lymph nodes of head, face and neck: Secondary | ICD-10-CM | POA: Diagnosis not present

## 2020-04-18 DIAGNOSIS — C7801 Secondary malignant neoplasm of right lung: Secondary | ICD-10-CM | POA: Diagnosis not present

## 2020-04-18 MED ORDER — HYDROMORPHONE HCL 4 MG PO TABS: 4.0000 mg | ORAL_TABLET | ORAL | 0 refills | Status: DC | PRN

## 2020-04-18 NOTE — Progress Notes (Signed)
Patient requested prednisone for his ongoing hive flares. Per Dr. Ernst Bowler patient given 14 tablets prednisone.

## 2020-04-22 ENCOUNTER — Other Ambulatory Visit: Payer: Self-pay | Admitting: Oncology

## 2020-04-22 ENCOUNTER — Ambulatory Visit
Admission: RE | Admit: 2020-04-22 | Discharge: 2020-04-22 | Disposition: A | Discharge status: Home / Self Care | Payer: Medicare Other | Source: Ambulatory Visit | Attending: Radiation Oncology | Admitting: Radiation Oncology

## 2020-04-22 DIAGNOSIS — C7801 Secondary malignant neoplasm of right lung: Secondary | ICD-10-CM | POA: Diagnosis not present

## 2020-04-22 DIAGNOSIS — C7800 Secondary malignant neoplasm of unspecified lung: Secondary | ICD-10-CM

## 2020-04-22 DIAGNOSIS — C770 Secondary and unspecified malignant neoplasm of lymph nodes of head, face and neck: Secondary | ICD-10-CM | POA: Diagnosis not present

## 2020-04-22 DIAGNOSIS — C771 Secondary and unspecified malignant neoplasm of intrathoracic lymph nodes: Secondary | ICD-10-CM | POA: Diagnosis not present

## 2020-04-22 DIAGNOSIS — C7951 Secondary malignant neoplasm of bone: Secondary | ICD-10-CM | POA: Diagnosis not present

## 2020-04-22 DIAGNOSIS — Z510 Encounter for antineoplastic radiation therapy: Secondary | ICD-10-CM | POA: Diagnosis not present

## 2020-04-23 ENCOUNTER — Ambulatory Visit
Admission: RE | Admit: 2020-04-23 | Discharge: 2020-04-23 | Disposition: A | Discharge status: Home / Self Care | Payer: Medicare Other | Source: Ambulatory Visit | Attending: Radiation Oncology | Admitting: Radiation Oncology

## 2020-04-23 ENCOUNTER — Other Ambulatory Visit: Payer: Self-pay

## 2020-04-23 ENCOUNTER — Encounter: Payer: Self-pay | Admitting: Urology

## 2020-04-23 DIAGNOSIS — Z510 Encounter for antineoplastic radiation therapy: Secondary | ICD-10-CM | POA: Diagnosis not present

## 2020-04-23 DIAGNOSIS — C7800 Secondary malignant neoplasm of unspecified lung: Secondary | ICD-10-CM

## 2020-04-23 DIAGNOSIS — C770 Secondary and unspecified malignant neoplasm of lymph nodes of head, face and neck: Secondary | ICD-10-CM | POA: Diagnosis not present

## 2020-04-23 DIAGNOSIS — C7801 Secondary malignant neoplasm of right lung: Secondary | ICD-10-CM | POA: Diagnosis not present

## 2020-04-23 DIAGNOSIS — C771 Secondary and unspecified malignant neoplasm of intrathoracic lymph nodes: Secondary | ICD-10-CM | POA: Diagnosis not present

## 2020-05-01 DIAGNOSIS — L501 Idiopathic urticaria: Secondary | ICD-10-CM | POA: Diagnosis not present

## 2020-05-02 ENCOUNTER — Ambulatory Visit (INDEPENDENT_AMBULATORY_CARE_PROVIDER_SITE_OTHER): Payer: Medicare Other

## 2020-05-02 ENCOUNTER — Other Ambulatory Visit: Payer: Self-pay

## 2020-05-02 ENCOUNTER — Telehealth: Payer: Self-pay

## 2020-05-02 DIAGNOSIS — L501 Idiopathic urticaria: Secondary | ICD-10-CM | POA: Diagnosis not present

## 2020-05-02 NOTE — Telephone Encounter (Signed)
Patient is complaining of ongoing rash on his chest. He is unsure if it due to his radiation therapy or not. I did take pictures for him with Dr. Gillermina Hu phone per the MDs request. Patient reports that this has been ongoing for about 5 days. Seems to only itch at night. He did request more prednisone. Per Dr. Ernst Bowler I did instruct patient to speak with his oncologist about the prednisone. He did verbalize understanding of this.

## 2020-05-03 NOTE — Telephone Encounter (Signed)
We have essentially given him a round of prednisone every time that he has come in for Xolair over the past several months. I think that he definitely needs to talk to his oncologist about this. I do not want to keep throwing prednisone at him when our goal is to decrease/eliminate prednisone use.   We also have to ask the question whether the Xolair is improving his quality of life, which was one of the main goals as well.   Salvatore Marvel, MD [facility]

## 2020-05-15 DIAGNOSIS — L501 Idiopathic urticaria: Secondary | ICD-10-CM | POA: Diagnosis not present

## 2020-05-16 ENCOUNTER — Other Ambulatory Visit: Payer: Self-pay

## 2020-05-16 ENCOUNTER — Ambulatory Visit (INDEPENDENT_AMBULATORY_CARE_PROVIDER_SITE_OTHER): Payer: Medicare Other

## 2020-05-16 DIAGNOSIS — L501 Idiopathic urticaria: Secondary | ICD-10-CM

## 2020-05-20 DIAGNOSIS — C7951 Secondary malignant neoplasm of bone: Secondary | ICD-10-CM | POA: Diagnosis not present

## 2020-05-20 DIAGNOSIS — C679 Malignant neoplasm of bladder, unspecified: Secondary | ICD-10-CM | POA: Diagnosis not present

## 2020-05-27 ENCOUNTER — Other Ambulatory Visit: Payer: Self-pay

## 2020-05-27 ENCOUNTER — Encounter: Payer: Self-pay | Admitting: Urology

## 2020-05-27 DIAGNOSIS — Z1331 Encounter for screening for depression: Secondary | ICD-10-CM | POA: Diagnosis not present

## 2020-05-27 DIAGNOSIS — J449 Chronic obstructive pulmonary disease, unspecified: Secondary | ICD-10-CM | POA: Diagnosis not present

## 2020-05-27 DIAGNOSIS — R319 Hematuria, unspecified: Secondary | ICD-10-CM | POA: Diagnosis not present

## 2020-05-27 DIAGNOSIS — D709 Neutropenia, unspecified: Secondary | ICD-10-CM | POA: Diagnosis not present

## 2020-05-27 DIAGNOSIS — I10 Essential (primary) hypertension: Secondary | ICD-10-CM | POA: Diagnosis not present

## 2020-05-27 DIAGNOSIS — Z6829 Body mass index (BMI) 29.0-29.9, adult: Secondary | ICD-10-CM | POA: Diagnosis not present

## 2020-05-27 DIAGNOSIS — C7801 Secondary malignant neoplasm of right lung: Secondary | ICD-10-CM | POA: Diagnosis not present

## 2020-05-27 DIAGNOSIS — C679 Malignant neoplasm of bladder, unspecified: Secondary | ICD-10-CM | POA: Diagnosis not present

## 2020-05-27 DIAGNOSIS — N390 Urinary tract infection, site not specified: Secondary | ICD-10-CM | POA: Diagnosis not present

## 2020-05-27 DIAGNOSIS — R509 Fever, unspecified: Secondary | ICD-10-CM | POA: Diagnosis not present

## 2020-05-27 NOTE — Progress Notes (Signed)
Patient meaningful use is complete . Patient is aware that this is a phone visit.

## 2020-05-27 NOTE — Progress Notes (Signed)
  Radiation Oncology         [phone] ________________________________  Name: [name] MRN: [id number]  Date: 04/23/2020  DOB: April 07, 1941  End of Treatment Note  Diagnosis:   79 y.o. 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     Indication for treatment:  Curative, Definitive SBRT       Radiation treatment dates:   04/09/20 - 04/23/20  Site/dose:   The RUL and right paratracheal node targets were treated to 50 Gy in 10 fractions of 5 Gy each  Beams/energy:   The patient was treated using stereotactic body radiotherapy according to a 3D conformal radiotherapy plan.  Volumetric arc fields were employed to deliver 6 MV X-rays.  Image guidance was performed with per fraction cone beam CT prior to treatment under personal MD supervision.  Immobilization was achieved using BodyFix Pillow.  Narrative: The patient tolerated radiation treatment relatively well with decreased frequency of cough and mild fatigue. He specifically denied dysphagia, chest pain, increased shortness of breath or hemoptysis.  Plan: The patient has completed radiation treatment. The patient will return to radiation oncology clinic for routine followup in one month. I advised them to call or return sooner if they have any questions or concerns related to their recovery or treatment. ________________________________  Sheral Apley. Tammi Klippel, M.D.

## 2020-05-27 NOTE — Progress Notes (Signed)
Radiation Oncology         [phone] ________________________________  Name: [name] MRN: [id number]  Date: 05/28/2020  DOB: [birth year]  Post Treatment Note  CC: Lance School, MD  Lance Portela, MD  Diagnosis:   79 y.o.gentleman with stage IV (pT3b, pN0) high-grade urothelial carcinoma of the bladder with disease progression in the right upper lobe lung and a right paratracheal node.    Interval Since Last Radiation:  5 weeks  04/09/20 - 04/23/20:  The RUL and right paratracheal node targets were treated to 50 Gy in 10 fractions of 5 Gy each  01/02/18 - 01/16/18: Right ischial lesion was treated to 40 Gy with 10 fx of 4 Gy  Narrative:  I spoke with the patient to conduct his routine scheduled 1 month follow up visit via telephone to spare the patient unnecessary potential exposure in the healthcare setting during the current COVID-19 pandemic.  The patient was notified in advance and gave permission to proceed with this visit format.  He tolerated radiation treatment relatively well with decreased frequency of cough and mild fatigue. He specifically denied dysphagia, chest pain, increased shortness of breath or hemoptysis.                              On review of systems, the patient states that he is doing well in general.  He has been having some night sweats but attributes that to treatment of a recent UTI which is being managed by his PCP, Dr. Gerarda [name].  Otherwise, he feels well and denies any chest pain, increased shortness of breath, increased productive cough or hemoptysis.  He denies any significant change in his energy level until he developed the UTI.  He is now on his 2nd round of antibiotics and feels like he is making some progress at this point.  He denies dysphagia and reports a healthy appetite, maintaining his weight.  He has a scheduled follow-up visit with Dr. Alen [name] on 06/17/2020.  ALLERGIES:  is allergic to ciprofloxacin and cefepime.  Meds: Current Outpatient  Medications  Medication Sig Dispense Refill  . cephALEXin (KEFLEX) 500 MG capsule Take 1 capsule (500 mg total) by mouth 4 (four) times daily. 20 capsule 0  . doxycycline (VIBRA-TABS) 100 MG tablet Take 100 mg by mouth 2 (two) times daily.    Marland Kitchen lactose free nutrition (BOOST) LIQD Take 237 mLs by mouth 3 (three) times daily between meals.     Marland Kitchen oxyCODONE (OXY IR/ROXICODONE) 5 MG immediate release tablet Take 1-2 tablets (5-10 mg total) by mouth every 4 (four) hours as needed for severe pain. 120 tablet 0  . acetaminophen (TYLENOL) 500 MG tablet Take 1,000 mg by mouth every 6 (six) hours as needed for fever.  (Patient not taking: Reported on 03/28/2020)    . diphenhydrAMINE (BENADRYL) 25 MG tablet Take 25 mg by mouth every 6 (six) hours as needed for itching. (Patient not taking: Reported on 03/28/2020)    . EPINEPHrine 0.3 mg/0.3 mL IJ SOAJ injection INJECT INTO THIGH ASRNEEDED FOR ALLERGICSREACTION. (Patient not taking: Reported on 03/28/2020)    . famotidine (PEPCID) 40 MG tablet Take 1 tablet (40 mg total) by mouth daily. (Patient not taking: Reported on 05/27/2020) 30 tablet 11  . gabapentin (NEURONTIN) 300 MG capsule Take 1 capsule (300 mg total) by mouth at bedtime for 21 days. 21 capsule 0  . HYDROmorphone (DILAUDID) 4 MG tablet TAKE 1 TABLET BY  MOUTH EVERY 4 HOURS AS NEEDED FOR SEVERE PAIN. (Patient not taking: Reported on 05/27/2020) 120 tablet 0  . predniSONE (DELTASONE) 10 MG tablet Take 1 tablet (10 mg total) by mouth 2 (two) times daily with a meal. (Patient not taking: Reported on 03/28/2020) 60 tablet 0  . predniSONE (DELTASONE) 5 MG tablet 6 tab x 1 day, 5 tab x 1 day, 4 tab x 1 day, 3 tab x 1 day, 2 tab x 1 day, 1 tab x 1 day, stop (Patient not taking: Reported on 03/28/2020) 21 tablet 0  . sulfamethoxazole-trimethoprim (BACTRIM DS) 800-160 MG tablet Take 1 tablet by mouth daily. (Patient not taking: Reported on 03/28/2020) 90 tablet 3  . valACYclovir (VALTREX) 1000 MG tablet Take 1 tablet  (1,000 mg total) by mouth 3 (three) times daily. (Patient not taking: Reported on 03/28/2020) 21 tablet 0   Current Facility-Administered Medications  Medication Dose Route Frequency Provider Last Rate Last Admin  . omalizumab Arvid Right) injection 300 mg  300 mg Subcutaneous Q14 Days Valentina Shaggy, MD   300 mg at 05/16/20 1328    Physical Findings:  vitals were not taken for this visit.   /10 Unable to assess due to telephone follow up visit format.  Lab Findings: Lab Results  Component Value Date   WBC 11.2 (H) 03/05/2020   HGB 14.8 03/05/2020   HCT 44.1 03/05/2020   MCV 107.8 (H) 03/05/2020   PLT 208 03/05/2020     Radiographic Findings: No results found.  Impression/Plan: 1. 79 y.o.gentleman with stage IV (pT3b, pN0) high-grade urothelial carcinoma of the bladder with disease progression in the right upper lobe lung and a right paratracheal node.   The patient appears to have tolerated his recent radiation treatments well and is currently without complaints.  We discussed that while we are happy to continue to see him if clinically indicated in the future, at this time, we will plan to see him back on an as needed basis. He will continue in routine follow up with Dr. Alen [name], with his next appointment scheduled for 06/17/2020 for continued management of his systemic disease but knows that he is welcome to call at any time with any questions or concerns related to his prior radiotherapy. He is comfortable and in agreement wth the stated plan.    Nicholos Johns, PA-C

## 2020-05-28 ENCOUNTER — Ambulatory Visit
Admission: RE | Admit: 2020-05-28 | Discharge: 2020-05-28 | Disposition: A | Discharge status: Home / Self Care | Payer: Medicare Other | Source: Ambulatory Visit | Attending: Urology | Admitting: Urology

## 2020-05-28 DIAGNOSIS — C7800 Secondary malignant neoplasm of unspecified lung: Secondary | ICD-10-CM

## 2020-05-29 DIAGNOSIS — L501 Idiopathic urticaria: Secondary | ICD-10-CM | POA: Diagnosis not present

## 2020-05-30 ENCOUNTER — Ambulatory Visit (INDEPENDENT_AMBULATORY_CARE_PROVIDER_SITE_OTHER): Payer: Medicare Other

## 2020-05-30 ENCOUNTER — Other Ambulatory Visit: Payer: Self-pay | Admitting: Oncology

## 2020-05-30 DIAGNOSIS — L501 Idiopathic urticaria: Secondary | ICD-10-CM | POA: Diagnosis not present

## 2020-05-30 MED ORDER — HYDROMORPHONE HCL 4 MG PO TABS: 4.0000 mg | ORAL_TABLET | ORAL | 0 refills | Status: DC | PRN

## 2020-05-30 NOTE — Telephone Encounter (Signed)
For your approval or refusal. Keyonni Percival M Janifer Gieselman, RN  

## 2020-06-03 ENCOUNTER — Other Ambulatory Visit: Payer: Self-pay | Admitting: Oncology

## 2020-06-03 DIAGNOSIS — C679 Malignant neoplasm of bladder, unspecified: Secondary | ICD-10-CM

## 2020-06-03 MED ORDER — OXYCODONE HCL 5 MG PO TABS: 5.0000 mg | ORAL_TABLET | ORAL | 0 refills | Status: DC | PRN

## 2020-06-03 MED ORDER — HYDROMORPHONE HCL 4 MG PO TABS: 4.0000 mg | ORAL_TABLET | ORAL | 0 refills | Status: DC | PRN

## 2020-06-10 DIAGNOSIS — L501 Idiopathic urticaria: Secondary | ICD-10-CM | POA: Diagnosis not present

## 2020-06-11 ENCOUNTER — Other Ambulatory Visit: Payer: Self-pay

## 2020-06-11 ENCOUNTER — Ambulatory Visit (INDEPENDENT_AMBULATORY_CARE_PROVIDER_SITE_OTHER): Payer: Medicare Other

## 2020-06-11 DIAGNOSIS — L501 Idiopathic urticaria: Secondary | ICD-10-CM

## 2020-06-13 DIAGNOSIS — C7951 Secondary malignant neoplasm of bone: Secondary | ICD-10-CM | POA: Diagnosis not present

## 2020-06-13 DIAGNOSIS — C679 Malignant neoplasm of bladder, unspecified: Secondary | ICD-10-CM | POA: Diagnosis not present

## 2020-06-13 DIAGNOSIS — N3289 Other specified disorders of bladder: Secondary | ICD-10-CM | POA: Diagnosis not present

## 2020-06-13 DIAGNOSIS — Z9889 Other specified postprocedural states: Secondary | ICD-10-CM | POA: Diagnosis not present

## 2020-06-14 DIAGNOSIS — G894 Chronic pain syndrome: Secondary | ICD-10-CM | POA: Diagnosis not present

## 2020-06-14 DIAGNOSIS — C679 Malignant neoplasm of bladder, unspecified: Secondary | ICD-10-CM | POA: Diagnosis not present

## 2020-06-14 DIAGNOSIS — C7951 Secondary malignant neoplasm of bone: Secondary | ICD-10-CM | POA: Diagnosis not present

## 2020-06-14 DIAGNOSIS — F4542 Pain disorder with related psychological factors: Secondary | ICD-10-CM | POA: Diagnosis not present

## 2020-06-16 ENCOUNTER — Inpatient Hospital Stay (HOSPITAL_COMMUNITY)
Admission: EM | Admit: 2020-06-16 | Discharge: 2020-06-19 | DRG: 698 | Disposition: A | Discharge status: Home / Self Care | Payer: Medicare Other | Attending: Family Medicine | Admitting: Family Medicine

## 2020-06-16 ENCOUNTER — Other Ambulatory Visit: Payer: Self-pay

## 2020-06-16 ENCOUNTER — Encounter (HOSPITAL_COMMUNITY): Payer: Self-pay | Admitting: Emergency Medicine

## 2020-06-16 ENCOUNTER — Emergency Department (HOSPITAL_COMMUNITY): Payer: Medicare Other

## 2020-06-16 DIAGNOSIS — K760 Fatty (change of) liver, not elsewhere classified: Secondary | ICD-10-CM | POA: Diagnosis not present

## 2020-06-16 DIAGNOSIS — R000 Tachycardia, unspecified: Secondary | ICD-10-CM | POA: Diagnosis present

## 2020-06-16 DIAGNOSIS — A419 Sepsis, unspecified organism: Secondary | ICD-10-CM | POA: Diagnosis present

## 2020-06-16 DIAGNOSIS — Z87891 Personal history of nicotine dependence: Secondary | ICD-10-CM | POA: Diagnosis not present

## 2020-06-16 DIAGNOSIS — R7989 Other specified abnormal findings of blood chemistry: Secondary | ICD-10-CM | POA: Diagnosis present

## 2020-06-16 DIAGNOSIS — Y846 Urinary catheterization as the cause of abnormal reaction of the patient, or of later complication, without mention of misadventure at the time of the procedure: Secondary | ICD-10-CM | POA: Diagnosis present

## 2020-06-16 DIAGNOSIS — R6520 Severe sepsis without septic shock: Secondary | ICD-10-CM | POA: Diagnosis present

## 2020-06-16 DIAGNOSIS — Z9221 Personal history of antineoplastic chemotherapy: Secondary | ICD-10-CM

## 2020-06-16 DIAGNOSIS — Z9079 Acquired absence of other genital organ(s): Secondary | ICD-10-CM | POA: Diagnosis not present

## 2020-06-16 DIAGNOSIS — Z936 Other artificial openings of urinary tract status: Secondary | ICD-10-CM | POA: Diagnosis not present

## 2020-06-16 DIAGNOSIS — R509 Fever, unspecified: Secondary | ICD-10-CM | POA: Diagnosis present

## 2020-06-16 DIAGNOSIS — N390 Urinary tract infection, site not specified: Secondary | ICD-10-CM | POA: Diagnosis present

## 2020-06-16 DIAGNOSIS — C679 Malignant neoplasm of bladder, unspecified: Secondary | ICD-10-CM | POA: Diagnosis present

## 2020-06-16 DIAGNOSIS — E860 Dehydration: Secondary | ICD-10-CM | POA: Diagnosis present

## 2020-06-16 DIAGNOSIS — T83518A Infection and inflammatory reaction due to other urinary catheter, initial encounter: Principal | ICD-10-CM | POA: Diagnosis present

## 2020-06-16 DIAGNOSIS — K219 Gastro-esophageal reflux disease without esophagitis: Secondary | ICD-10-CM | POA: Diagnosis present

## 2020-06-16 DIAGNOSIS — A4152 Sepsis due to Pseudomonas: Secondary | ICD-10-CM | POA: Diagnosis present

## 2020-06-16 DIAGNOSIS — Z20822 Contact with and (suspected) exposure to covid-19: Secondary | ICD-10-CM | POA: Diagnosis present

## 2020-06-16 DIAGNOSIS — Z79899 Other long term (current) drug therapy: Secondary | ICD-10-CM | POA: Diagnosis not present

## 2020-06-16 DIAGNOSIS — R319 Hematuria, unspecified: Secondary | ICD-10-CM | POA: Diagnosis not present

## 2020-06-16 DIAGNOSIS — R6889 Other general symptoms and signs: Secondary | ICD-10-CM | POA: Diagnosis present

## 2020-06-16 DIAGNOSIS — E872 Acidosis: Secondary | ICD-10-CM | POA: Diagnosis present

## 2020-06-16 DIAGNOSIS — R059 Cough, unspecified: Secondary | ICD-10-CM | POA: Diagnosis not present

## 2020-06-16 DIAGNOSIS — G8929 Other chronic pain: Secondary | ICD-10-CM | POA: Diagnosis present

## 2020-06-16 DIAGNOSIS — I10 Essential (primary) hypertension: Secondary | ICD-10-CM | POA: Diagnosis present

## 2020-06-16 DIAGNOSIS — R739 Hyperglycemia, unspecified: Secondary | ICD-10-CM | POA: Diagnosis present

## 2020-06-16 DIAGNOSIS — A498 Other bacterial infections of unspecified site: Secondary | ICD-10-CM | POA: Diagnosis not present

## 2020-06-16 DIAGNOSIS — Z923 Personal history of irradiation: Secondary | ICD-10-CM

## 2020-06-16 DIAGNOSIS — N400 Enlarged prostate without lower urinary tract symptoms: Secondary | ICD-10-CM | POA: Diagnosis present

## 2020-06-16 LAB — HEMOGLOBIN A1C
Hgb A1c MFr Bld: 5.1 % (ref 4.8–5.6)
Mean Plasma Glucose: 99.67 mg/dL

## 2020-06-16 LAB — CBC WITH DIFFERENTIAL/PLATELET
Abs Immature Granulocytes: 0.04 10*3/uL (ref 0.00–0.07)
Basophils Absolute: 0 10*3/uL (ref 0.0–0.1)
Basophils Relative: 0 %
Eosinophils Absolute: 0 10*3/uL (ref 0.0–0.5)
Eosinophils Relative: 0 %
HCT: 42.2 % (ref 39.0–52.0)
Hemoglobin: 13.7 g/dL (ref 13.0–17.0)
Immature Granulocytes: 0 %
Lymphocytes Relative: 2 %
Lymphs Abs: 0.2 10*3/uL — ABNORMAL LOW (ref 0.7–4.0)
MCH: 35.8 pg — ABNORMAL HIGH (ref 26.0–34.0)
MCHC: 32.5 g/dL (ref 30.0–36.0)
MCV: 110.2 fL — ABNORMAL HIGH (ref 80.0–100.0)
Monocytes Absolute: 1.7 10*3/uL — ABNORMAL HIGH (ref 0.1–1.0)
Monocytes Relative: 13 %
Neutro Abs: 10.6 10*3/uL — ABNORMAL HIGH (ref 1.7–7.7)
Neutrophils Relative %: 85 %
Platelets: 166 10*3/uL (ref 150–400)
RBC: 3.83 MIL/uL — ABNORMAL LOW (ref 4.22–5.81)
RDW: 12.8 % (ref 11.5–15.5)
WBC: 12.7 10*3/uL — ABNORMAL HIGH (ref 4.0–10.5)
nRBC: 0 % (ref 0.0–0.2)

## 2020-06-16 LAB — URINALYSIS, ROUTINE W REFLEX MICROSCOPIC
Bilirubin Urine: NEGATIVE
Glucose, UA: NEGATIVE mg/dL
Ketones, ur: NEGATIVE mg/dL
Nitrite: NEGATIVE
Protein, ur: 100 mg/dL — AB
Specific Gravity, Urine: 1.009 (ref 1.005–1.030)
Trans Epithel, UA: 3
WBC, UA: >50 WBC/hpf — ABNORMAL HIGH (ref 0–5)
pH: 7 (ref 5.0–8.0)

## 2020-06-16 LAB — LACTIC ACID, PLASMA
Lactic Acid, Venous: 2.9 mmol/L (ref 0.5–1.9)
Lactic Acid, Venous: 1.7 mmol/L (ref 0.5–1.9)

## 2020-06-16 LAB — RESP PANEL BY RT PCR (RSV, FLU A&B, COVID)
Influenza A by PCR: NEGATIVE
Influenza B by PCR: NEGATIVE
Respiratory Syncytial Virus by PCR: NEGATIVE
SARS Coronavirus 2 by RT PCR: NEGATIVE

## 2020-06-16 LAB — COMPREHENSIVE METABOLIC PANEL
ALT: 12 U/L (ref 0–44)
AST: 16 U/L (ref 15–41)
Albumin: 3.1 g/dL — ABNORMAL LOW (ref 3.5–5.0)
Alkaline Phosphatase: 56 U/L (ref 38–126)
Anion gap: 9 (ref 5–15)
BUN: 12 mg/dL (ref 8–23)
CO2: 22 mmol/L (ref 22–32)
Calcium: 8.6 mg/dL — ABNORMAL LOW (ref 8.9–10.3)
Chloride: 103 mmol/L (ref 98–111)
Creatinine, Ser: 1.16 mg/dL (ref 0.61–1.24)
GFR, Estimated: >60 mL/min (ref >60)
Glucose, Bld: 153 mg/dL — ABNORMAL HIGH (ref 70–99)
Potassium: 3.8 mmol/L (ref 3.5–5.1)
Sodium: 134 mmol/L — ABNORMAL LOW (ref 135–145)
Total Bilirubin: 0.9 mg/dL (ref 0.3–1.2)
Total Protein: 5.9 g/dL — ABNORMAL LOW (ref 6.5–8.1)

## 2020-06-16 LAB — APTT: aPTT: 28 seconds (ref 24–36)

## 2020-06-16 LAB — PROTIME-INR
INR: 1.1 (ref 0.8–1.2)
Prothrombin Time: 14.1 seconds (ref 11.4–15.2)

## 2020-06-16 LAB — LIPASE, BLOOD: Lipase: 20 U/L (ref 11–51)

## 2020-06-16 MED ORDER — HYDROXYZINE HCL 25 MG PO TABS: 25.0000 mg | ORAL_TABLET | Freq: Three times a day (TID) | ORAL | Status: DC | PRN

## 2020-06-16 MED ORDER — BOOST PO LIQD
237.0000 mL | Freq: Three times a day (TID) | ORAL | Status: DC
  Administered 2020-06-17 – 2020-06-18 (×6): 237 mL via ORAL
  Filled 2020-06-16 (×13): qty 237

## 2020-06-16 MED ORDER — SODIUM CHLORIDE 0.9 % IV SOLN
2.0000 g | Freq: Once | INTRAVENOUS | Status: AC
  Administered 2020-06-16: 2 g via INTRAVENOUS
  Filled 2020-06-16: qty 2

## 2020-06-16 MED ORDER — ACETAMINOPHEN 650 MG RE SUPP: 650.0000 mg | Freq: Four times a day (QID) | RECTAL | Status: DC | PRN

## 2020-06-16 MED ORDER — VANCOMYCIN HCL IN DEXTROSE 1-5 GM/200ML-% IV SOLN: 1000.0000 mg | Freq: Once | INTRAVENOUS | Status: DC

## 2020-06-16 MED ORDER — VANCOMYCIN HCL 1500 MG/300ML IV SOLN
1500.0000 mg | INTRAVENOUS | Status: DC
  Administered 2020-06-17: 1500 mg via INTRAVENOUS
  Filled 2020-06-16: qty 300

## 2020-06-16 MED ORDER — FENTANYL CITRATE (PF) 100 MCG/2ML IJ SOLN
50.0000 ug | Freq: Once | INTRAMUSCULAR | Status: AC
  Administered 2020-06-16: 50 ug via INTRAVENOUS
  Filled 2020-06-16: qty 2

## 2020-06-16 MED ORDER — ACETAMINOPHEN 325 MG PO TABS
650.0000 mg | ORAL_TABLET | Freq: Four times a day (QID) | ORAL | Status: DC | PRN
  Administered 2020-06-16 – 2020-06-17 (×2): 650 mg via ORAL
  Filled 2020-06-16 (×2): qty 2

## 2020-06-16 MED ORDER — ACETAMINOPHEN 500 MG PO TABS
1000.0000 mg | ORAL_TABLET | Freq: Once | ORAL | Status: AC
  Administered 2020-06-16: 1000 mg via ORAL
  Filled 2020-06-16: qty 2

## 2020-06-16 MED ORDER — ALBUTEROL SULFATE (2.5 MG/3ML) 0.083% IN NEBU: 2.5000 mg | INHALATION_SOLUTION | RESPIRATORY_TRACT | Status: DC | PRN

## 2020-06-16 MED ORDER — ENOXAPARIN SODIUM 40 MG/0.4ML ~~LOC~~ SOLN
40.0000 mg | SUBCUTANEOUS | Status: DC
  Administered 2020-06-16 – 2020-06-19 (×4): 40 mg via SUBCUTANEOUS
  Filled 2020-06-16 (×3): qty 0.4

## 2020-06-16 MED ORDER — ONDANSETRON HCL 4 MG/2ML IJ SOLN
4.0000 mg | Freq: Four times a day (QID) | INTRAMUSCULAR | Status: DC | PRN
  Administered 2020-06-16: 4 mg via INTRAVENOUS
  Filled 2020-06-16: qty 2

## 2020-06-16 MED ORDER — LACTATED RINGERS IV BOLUS
1000.0000 mL | Freq: Once | INTRAVENOUS | Status: AC
  Administered 2020-06-16: 1000 mL via INTRAVENOUS

## 2020-06-16 MED ORDER — IOHEXOL 300 MG/ML  SOLN
100.0000 mL | Freq: Once | INTRAMUSCULAR | Status: AC | PRN
  Administered 2020-06-16: 100 mL via INTRAVENOUS

## 2020-06-16 MED ORDER — VANCOMYCIN HCL 2000 MG/400ML IV SOLN
2000.0000 mg | Freq: Once | INTRAVENOUS | Status: AC
  Administered 2020-06-16: 2000 mg via INTRAVENOUS
  Filled 2020-06-16: qty 400

## 2020-06-16 MED ORDER — SODIUM CHLORIDE 0.9 % IV SOLN
2.0000 g | Freq: Three times a day (TID) | INTRAVENOUS | Status: DC
  Administered 2020-06-16 – 2020-06-18 (×6): 2 g via INTRAVENOUS
  Filled 2020-06-16 (×9): qty 2

## 2020-06-16 MED ORDER — METRONIDAZOLE IN NACL 5-0.79 MG/ML-% IV SOLN
500.0000 mg | Freq: Once | INTRAVENOUS | Status: AC
  Administered 2020-06-16: 500 mg via INTRAVENOUS
  Filled 2020-06-16: qty 100

## 2020-06-16 MED ORDER — HYDROMORPHONE HCL 1 MG/ML IJ SOLN
0.5000 mg | INTRAMUSCULAR | Status: DC | PRN
  Administered 2020-06-16 – 2020-06-19 (×4): 0.5 mg via INTRAVENOUS
  Filled 2020-06-16 (×4): qty 0.5

## 2020-06-16 MED ORDER — LACTATED RINGERS IV SOLN: INTRAVENOUS | Status: DC

## 2020-06-16 MED ORDER — OXYCODONE HCL 5 MG PO TABS
5.0000 mg | ORAL_TABLET | ORAL | Status: DC | PRN
  Administered 2020-06-16 – 2020-06-17 (×3): 5 mg via ORAL
  Filled 2020-06-16 (×3): qty 1

## 2020-06-16 MED ORDER — LABETALOL HCL 5 MG/ML IV SOLN: 10.0000 mg | INTRAVENOUS | Status: DC | PRN

## 2020-06-16 MED ORDER — ONDANSETRON HCL 4 MG PO TABS: 4.0000 mg | ORAL_TABLET | Freq: Four times a day (QID) | ORAL | Status: DC | PRN

## 2020-06-16 NOTE — Progress Notes (Signed)
   06/16/20 1700  Assess: MEWS Score  Temp (!) 102.5 F (39.2 C)  BP (!) 145/73  Pulse Rate (!) 106  Resp 20  Level of Consciousness Alert  SpO2 97 %  O2 Device Room Air  Assess: MEWS Score  MEWS Temp 2  MEWS Systolic 0  MEWS Pulse 1  MEWS RR 0  MEWS LOC 0  MEWS Score 3  MEWS Score Color Yellow  Assess: if the MEWS score is Yellow or Red  Were vital signs taken at a resting state? Yes  Focused Assessment No change from prior assessment  Early Detection of Sepsis Score *See Row Information* Medium  MEWS guidelines implemented *See Row Information* Yes  Treat  MEWS Interventions Administered prn meds/treatments  Pain Scale 0-10  Pain Score 2  Take Vital Signs  Increase Vital Sign Frequency  Yellow: Q 2hr X 2 then Q 4hr X 2, if remains yellow, continue Q 4hrs  Escalate  MEWS: Escalate Yellow: discuss with charge nurse/RN and consider discussing with provider and RRT  Notify: Charge Nurse/RN  Name of Charge Nurse/RN Notified lisa bullins rn  Date Charge Nurse/RN Notified 06/16/20  Time Charge Nurse/RN Notified 0947  Notify: Provider  Provider Name/Title Dr.Johnson  Date Provider Notified 06/16/20  Time Provider Notified 1712  Notification Type Page  Notification Reason Other (Comment) (MEWS 3)  Response No new orders

## 2020-06-16 NOTE — Progress Notes (Signed)
Pharmacy Antibiotic Note  [name] is a 79 y.o. male admitted on 06/16/2020 with unknown source of infection.  Pharmacy has been consulted for Vancomycin and aztreonam dosing.  Plan: Vancomycin 2000 mg IV x 1 dose. Vancomycin 1500 mg IV every 24 hours. Expected AUC 466. Aztreonam 2000 mg IV every 8 hours. Monitor labs, c/s, and vanco level as indicated.  Height: 5\' 11"  (180.3 cm) Weight: 99.8 kg (220 lb) IBW/kg (Calculated) : 75.3  Temp (24hrs), Avg:99.1 F (37.3 C), Min:97.6 F (36.4 C), Max:100.8 F (38.2 C)  Recent Labs  Lab 06/16/20 0745 06/16/20 0746 06/16/20 1001  WBC 12.7*  --   --   CREATININE 1.16  --   --   LATICACIDVEN  --  2.9* 1.7    Estimated Creatinine Clearance: 62.2 mL/min (by C-G formula based on SCr of 1.16 mg/dL).    Allergies  Allergen Reactions  . Ciprofloxacin Hives  . Cefepime Rash    Antimicrobials this admission: Vanco 11/1 >>  Aztreonam 11/1 >>  Flagyl 11/1 >>  Microbiology results: 11/1 BCx: pending 11/1 UCx: pending    Thank you for allowing pharmacy to be a part of this patient's care.  Ramond Craver 06/16/2020 1:24 PM

## 2020-06-16 NOTE — ED Triage Notes (Signed)
Pt c/o of cough, back pain and body aches x 2 days

## 2020-06-16 NOTE — ED Provider Notes (Signed)
Emergency Department Provider Note   I have reviewed the triage vital signs and the nursing notes.   HISTORY  Chief Complaint Cough   HPI [name] is a 79 y.o. male with past medical history reviewed below including bladder cancer but not on active chemotherapy presents with cough, back pain, body aches.  The patient's back pain is more right hip discomfort and is chronic and related to his underlying cancer.  He is developed some congestion and cough symptoms along with body aches and occasional shaking chills.  Symptoms have worsened over the past 24 hours.  He has not had vomiting or diarrhea.  Denies any dysuria, hesitancy, urgency.  No radiation of symptoms or other modifying factors.  The patient does have a urostomy and felt some discomfort under that couple of days ago but that is resolved. Denies HA or neck pain.  Patient does take his temperature at home and notes a T-max of 101 F yesterday.   Past Medical History:  Diagnosis Date  . Bladder cancer ([place])   . BPH (benign prostatic hypertrophy)   . Dysuria   . GERD (gastroesophageal reflux disease)   . History of adenomatous polyp of colon    tubular adenoma 06/ 2018  . History of bladder cancer 12/2014   urologist-  dr [name]--  dx High Grade TCC without stromal invasion s/p TURBT and intravesical BCG tx/  recurrent 07/2015 (pTa) TCC  repear intravesical BCG tx   . History of hiatal hernia   . History of urethral stricture   . Nocturia     Patient Active Problem List   Diagnosis Date Noted  . GERD (gastroesophageal reflux disease)   . Sinus tachycardia   . Fever   . Dehydration   . Rigors   . Severe sepsis ([place])   . Elevated lactic acid level   . Cancer with pulmonary metastases ([place]) 03/31/2020  . Redness and swelling of thigh 06/05/2018  . Adverse drug reaction 06/05/2018  . Bacteremia associated with intravascular line ([place]) 04/20/2018  . Febrile neutropenia ([place]) 04/13/2018  . Gram positive sepsis  ([place]) 04/13/2018  . SIRS (systemic inflammatory response syndrome) ([place]) 04/12/2018  . Bacteremia due to Gram-positive bacteria 04/12/2018  . Pancytopenia ([place]) 04/12/2018  . Status post chemotherapy   . UTI (urinary tract infection) 04/11/2018  . Port-A-Cath in place 02/20/2018  . Encounter for antineoplastic chemotherapy 02/17/2018  . Sepsis due to urinary tract infection ([place]) 01/25/2018  . Sepsis secondary to UTI ([place]) 01/25/2018  . Hyperglycemia   . Goals of care, counseling/discussion 01/18/2018  . Bone metastasis ([place]) 12/29/2017  . 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 Bladder cancer ([place]) 06/28/2017  . Malignant neoplasm of urinary bladder ([place]) 04/20/2017  . COPD UNSPECIFIED 03/07/2008    Past Surgical History:  Procedure Laterality Date  . CARDIAC CATHETERIZATION  1997   normal coronary arteries  . CARDIOVASCULAR STRESS TEST  12-14-2005   Low risk perfusion study/  very small scar in the inferior septum from mid ventricle to apex,  no ischemia/  mild inferior septal hypokinesis/  ef 58%  . CATARACT EXTRACTION W/ INTRAOCULAR LENS  IMPLANT, BILATERAL  2011  . COLONOSCOPY  last one 06/ 2018  . CYSTOSCOPY WITH BIOPSY N/A 08/12/2015   Procedure: CYSTOSCOPY WITH BIOPSY AND FULGERATION;  Surgeon: [name], MD;  Location[facility];  Service: Urology;  Laterality: N/A;  . CYSTOSCOPY WITH INJECTION N/A 06/29/2017   Procedure: CYSTOSCOPY WITH INJECTION OF  INDOCYANINE GREEN DYE;  Surgeon: Alexis Frock, MD;  Location: [facility];  Service: Urology;  Laterality: N/A;  . CYSTOSCOPY WITH URETHRAL DILATATION N/A 03/21/2017   Procedure: CYSTOSCOPY;  Surgeon: [name], MD;  Location[facility];  Service: Urology;  Laterality: N/A;  . ESOPHAGOGASTRODUODENOSCOPY  12-05-2014  . IR IMAGING GUIDED PORT INSERTION  02/07/2018  . IR REMOVAL TUN ACCESS W/ PORT W/O FL MOD SED  04/14/2018  . TRANSTHORACIC ECHOCARDIOGRAM  01-31-2008   nomral  LVF,  ef 55-65%/  mild pulmonic stenosis without regurg.,  peak transpulomonic valve gradient 42mmHg  . TRANSURETHRAL RESECTION OF BLADDER TUMOR N/A 12/31/2014   Procedure: TRANSURETHRAL RESECTION OF BLADDER TUMOR (TURBT);  Surgeon: Lowella Bandy, MD;  Location[facility];  Service: Urology;  Laterality: N/A;  . TRANSURETHRAL RESECTION OF BLADDER TUMOR N/A 03/21/2017   Procedure: POSSIBLE TRANSURETHRAL RESECTION OF BLADDER TUMOR (TURBT);  Surgeon: [name], MD;  Location: [facility];  Service: Urology;  Laterality: N/A;    Allergies Ciprofloxacin and Cefepime  Family History  Problem Relation Age of Onset  . Alcoholism Father   . Colon cancer Neg Hx   . Colon polyps Neg Hx   . Diabetes Neg Hx   . Kidney disease Neg Hx   . Esophageal cancer Neg Hx   . Heart disease Neg Hx   . Gallbladder disease Neg Hx   . Allergic rhinitis Neg Hx   . Angioedema Neg Hx   . Asthma Neg Hx   . Atopy Neg Hx   . Eczema Neg Hx   . Immunodeficiency Neg Hx   . Urticaria Neg Hx     Social History Social History   Tobacco Use  . Smoking status: Former Smoker    Packs/day: 1.00    Years: 34.00    Pack years: 34.00    Types: Cigarettes    Quit date: 12/26/1988    Years since quitting: 31.4  . Smokeless tobacco: Never Used  Vaping Use  . Vaping Use: Never used  Substance Use Topics  . Alcohol use: Yes    Alcohol/week: 6.0 standard drinks    Types: 6 Cans of beer per week    Comment: BEER  . Drug use: No     Constitutional: Positive fever and chills with rigors at home.  Eyes: No visual changes. ENT: No sore throat. Cardiovascular: Denies chest pain. Respiratory: Denies shortness of breath. Positive cough and congestion.  Gastrointestinal: No abdominal pain.  No nausea, no vomiting.  No diarrhea.  No constipation. Genitourinary: Negative for dysuria. Musculoskeletal: Positive for back/right hip pain. Skin: Negative for rash. Neurological: Negative for  headaches, focal weakness or numbness.  10-point ROS otherwise negative.  ____________________________________________   PHYSICAL EXAM:  VITAL SIGNS: ED Triage Vitals  Enc Vitals Group     BP 06/16/20 0730 (!) 148/77     Pulse Rate 06/16/20 0730 (!) 101     Resp --      Temp 06/16/20 0734 97.8 F (36.6 C)     Temp Source 06/16/20 0734 Oral     SpO2 06/16/20 0730 93 %     Weight 06/16/20 0734 220 lb (99.8 kg)     Height 06/16/20 0734 5\' 11"  (1.803 m)   Constitutional: Alert and oriented. Well appearing and in no acute distress. Eyes: Conjunctivae are normal.  Head: Atraumatic. Nose: No congestion/rhinnorhea. Mouth/Throat: Mucous membranes are moist. Neck: No stridor.   Cardiovascular: Tachycardia. Good peripheral circulation. Grossly normal  heart sounds.   Respiratory: Normal respiratory effort.  No retractions. Lungs CTAB. Gastrointestinal: Soft and nontender. Well appearing urostomy without focal tenderness or surrounding cellulitis. No distention.  Musculoskeletal: No lower extremity tenderness nor edema. No gross deformities of extremities. Neurologic:  Normal speech and language. Skin:  Skin is warm, dry and intact. No rash noted.  ____________________________________________   LABS (all labs ordered are listed, but only abnormal results are displayed)  Labs Reviewed  URINE CULTURE - Abnormal; Notable for the following components:      Result Value   Culture   (*)    Value: >=100,000 COLONIES/mL GRAM NEGATIVE RODS CULTURE REINCUBATED FOR BETTER GROWTH SUSCEPTIBILITIES TO FOLLOW Performed at [facility] [address] [place], [place] [address]    All other components within normal limits  COMPREHENSIVE METABOLIC PANEL - Abnormal; Notable for the following components:   Sodium 134 (*)    Glucose, Bld 153 (*)    Calcium 8.6 (*)    Total Protein 5.9 (*)    Albumin 3.1 (*)    All other components within normal limits  CBC WITH DIFFERENTIAL/PLATELET -  Abnormal; Notable for the following components:   WBC 12.7 (*)    RBC 3.83 (*)    MCV 110.2 (*)    MCH 35.8 (*)    Neutro Abs 10.6 (*)    Lymphs Abs 0.2 (*)    Monocytes Absolute 1.7 (*)    All other components within normal limits  URINALYSIS, ROUTINE W REFLEX MICROSCOPIC - Abnormal; Notable for the following components:   APPearance HAZY (*)    Hgb urine dipstick SMALL (*)    Protein, ur 100 (*)    Leukocytes,Ua LARGE (*)    WBC, UA >50 (*)    Bacteria, UA RARE (*)    All other components within normal limits  LACTIC ACID, PLASMA - Abnormal; Notable for the following components:   Lactic Acid, Venous 2.9 (*)    All other components within normal limits  COMPREHENSIVE METABOLIC PANEL - Abnormal; Notable for the following components:   Potassium 3.4 (*)    Glucose, Bld 117 (*)    Calcium 8.0 (*)    Total Protein 4.8 (*)    Albumin 2.4 (*)    AST 14 (*)    All other components within normal limits  CBC WITH DIFFERENTIAL/PLATELET - Abnormal; Notable for the following components:   WBC 12.8 (*)    RBC 3.30 (*)    Hemoglobin 11.8 (*)    HCT 36.2 (*)    MCV 109.7 (*)    MCH 35.8 (*)    Platelets 146 (*)    Neutro Abs 9.8 (*)    Lymphs Abs 0.3 (*)    Monocytes Absolute 2.1 (*)    Abs Immature Granulocytes 0.10 (*)    All other components within normal limits  RESP PANEL BY RT PCR (RSV, FLU A&B, COVID)  CULTURE, BLOOD (ROUTINE X 2)  CULTURE, BLOOD (ROUTINE X 2)  LIPASE, BLOOD  LACTIC ACID, PLASMA  PROTIME-INR  APTT  HEMOGLOBIN A1C  MAGNESIUM   ____________________________________________  EKG   EKG Interpretation  Date/Time:  Monday June 16 2020 09:12:07 EDT Ventricular Rate:  101 PR Interval:    QRS Duration: 75 QT Interval:  332 QTC Calculation: 431 R Axis:   -44 Text Interpretation: Sinus tachycardia Probable left atrial enlargement Left axis deviation Abnormal R-wave progression, early transition Baseline wander in lead(s) I II aVR aVL V1 V3 V4 V5 V6  No STEMI Confirmed by Nanda Quinton [phone]) on 06/16/2020 9:47:38 AM       ____________________________________________  RADIOLOGY  DG Chest Portable 1 View  Result Date: 06/16/2020 CLINICAL DATA:  Cough, fever. EXAM: PORTABLE CHEST 1 VIEW COMPARISON:  February 17, 2020. FINDINGS: The heart size and mediastinal contours are within normal limits. Both lungs are clear. The visualized skeletal structures are unremarkable. IMPRESSION: No active disease. Electronically Signed   By: Marijo Conception M.D.   On: 06/16/2020 08:26    ____________________________________________   PROCEDURES  Procedure(s) performed:   Procedures  CRITICAL CARE Performed by: Margette Fast Total critical care time: 35 minutes Critical care time was exclusive of separately billable procedures and treating other patients. Critical care was necessary to treat or prevent imminent or life-threatening deterioration. Critical care was time spent personally by me on the following activities: development of treatment plan with patient and/or surrogate as well as nursing, discussions with consultants, evaluation of patient's response to treatment, examination of patient, obtaining history from patient or surrogate, ordering and performing treatments and interventions, ordering and review of laboratory studies, ordering and review of radiographic studies, pulse oximetry and re-evaluation of patient's condition.  Nanda Quinton, MD Emergency Medicine  ____________________________________________   INITIAL IMPRESSION / ASSESSMENT AND PLAN / ED COURSE  Pertinent labs & imaging results that were available during my care of the patient were reviewed by me and considered in my medical decision making (see chart for details).   Patient presents emergency department with fever, chills, rigors.  He has mainly respiratory type symptoms but does note some abdominal discomfort earlier in the week.  No focal tenderness on exam.  He has mild  tachycardia here but is afebrile.  He has had fever at home.  Suspect that his urine will look dirty but patient does have urostomy which complicates things.  We will send blood and urine cultures.  Patient's initial lactic acid came back elevated.  He is not in shock and will hold on the 30 mL/kg bolus but will start broad-spectrum antibiotics. COVID and Flu negative. CXR reviewed and negative.   Patient's labs resulting showing leukocytosis with initial elevated lactic acid.  Concern for developing sepsis.  Patient does meet criteria for this.  Suspected source is UTI but urostomy makes results difficult to interpret.  Will send for culture.  Have also sent blood cultures.  Chest x-ray unremarkable as reviewed above.  IV fluids and broad-spectrum antibiotics initiated and plan for admission. COVID negative.   Discussed patient's case with [facility] to request admission. Patient and family (if present) updated with plan. Care transferred to [facility] service.  I reviewed all nursing notes, vitals, pertinent old records, EKGs, labs, imaging (as available).  ____________________________________________  FINAL CLINICAL IMPRESSION(S) / ED DIAGNOSES  Final diagnoses:  Sepsis, due to unspecified organism, unspecified whether acute organ dysfunction present [facility])     MEDICATIONS GIVEN DURING THIS VISIT:  Medications  lactated ringers infusion ( Intravenous New Bag/Given 06/17/20 0247)  enoxaparin (LOVENOX) injection 40 mg (40 mg Subcutaneous Given 06/16/20 1414)  acetaminophen (TYLENOL) tablet 650 mg (650 mg Oral Given 06/17/20 0627)    Or  acetaminophen (TYLENOL) suppository 650 mg ( Rectal See Alternative 06/17/20 0627)  oxyCODONE (Oxy IR/ROXICODONE) immediate release tablet 5 mg (5 mg Oral Given 06/17/20 0627)  ondansetron (ZOFRAN) tablet 4 mg ( Oral See Alternative 06/16/20 1659)    Or  ondansetron (ZOFRAN) injection 4 mg (4 mg Intravenous Given 06/16/20 1659)  albuterol (PROVENTIL) (2.5  MG/3ML) 0.083%  nebulizer solution 2.5 mg (has no administration in time range)  labetalol (NORMODYNE) injection 10 mg (has no administration in time range)  vancomycin (VANCOREADY) IVPB 1500 mg/300 mL (has no administration in time range)  hydrOXYzine (ATARAX/VISTARIL) tablet 25 mg (has no administration in time range)  lactose free nutrition (Boost) liquid 237 mL (237 mLs Oral Not Given 06/16/20 1420)  aztreonam (AZACTAM) 2 g in sodium chloride 0.9 % 100 mL IVPB (2 g Intravenous New Bag/Given 06/17/20 0247)  HYDROmorphone (DILAUDID) injection 0.5 mg (0.5 mg Intravenous Given 06/16/20 1600)  potassium chloride SA (KLOR-CON) CR tablet 40 mEq (has no administration in time range)  aztreonam (AZACTAM) 2 g in sodium chloride 0.9 % 100 mL IVPB (0 g Intravenous Stopped 06/16/20 1030)  metroNIDAZOLE (FLAGYL) IVPB 500 mg (0 mg Intravenous Stopped 06/16/20 1228)  lactated ringers bolus 1,000 mL (0 mLs Intravenous Stopped 06/16/20 1110)  vancomycin (VANCOREADY) IVPB 2000 mg/400 mL (2,000 mg Intravenous New Bag/Given 06/16/20 1214)  fentaNYL (SUBLIMAZE) injection 50 mcg (50 mcg Intravenous Given 06/16/20 1045)  iohexol (OMNIPAQUE) 300 MG/ML solution 100 mL (100 mLs Intravenous Contrast Given 06/16/20 1022)  acetaminophen (TYLENOL) tablet 1,000 mg (1,000 mg Oral Given 06/16/20 1053)     Note:  This document was prepared using Dragon voice recognition software and may include unintentional dictation errors.  Nanda Quinton, MD, [facility] Emergency Medicine    Madex Seals, [place] [employer], MD 06/17/20 [phone]

## 2020-06-16 NOTE — ED Notes (Signed)
Patient transported to CT 

## 2020-06-16 NOTE — H&P (Signed)
History and Physical  [facility]  CAGE GUPTON BJY:[id number] DOB: [birth year] DOA: 06/16/2020  PCP: [name], MD   Patient coming from: Home   I have personally briefly reviewed patient's old medical records in [place]  Chief Complaint: cough, body aches  HPI: [name] is a 79 y.o. male with medical history significant for bladder cancer just recently completed radiation, chronic urostomy in place, HTN, BPH, GERD presented to ED complaining of cough and chest congestion, fever and body aches with shaking chills rigors that have gotten a little more little worse over the past 24 hours.  He has chronic back pain that has not changed.  Is having some foul smell from smell from the urostomy bag.  His urine appeared more cloudy recently.  He had a temperature of 101 yesterday.  He denies nausea vomiting and diarrhea.  He denies chest pain or shortness of breath.  ED Course: Patient had a temperature of 100.8, pulse 114, blood pressure 170/95, respirations 25, pulse ox 96% on room air.  His sodium was 134, glucose 153, Calcium 8.6, albumin 3.1, lactic acid 2.9.  WBC 12.7, hemoglobin 13.7, hematocrit 42.2, platelet count 166.  Patient was bolused IV fluids and repeat lactic acid was 1.7.  SARS 2 was 2 coronavirus test negative influenza AMB test negative RSV test negative.  Urinalysis positive for large leukocytes small amount of hemoglobin greater than 50 WBC per high-power field.  Portable chest x-ray no active disease seen.  Abdomen pelvis no acute findings.  Patient was given IV bolus fluids and started on broad-spectrum antibiotics and admission was requested for for admission was requested for further management.  Review of Systems: As per HPI otherwise 10 point review of systems negative.   Past Medical History:  Diagnosis Date  . Bladder cancer ([place])   . BPH (benign prostatic hypertrophy)   . Dysuria   . GERD (gastroesophageal reflux disease)   . History of  adenomatous polyp of colon    tubular adenoma 06/ 2018  . History of bladder cancer 12/2014   urologist-  dr [name]--  dx High Grade TCC without stromal invasion s/p TURBT and intravesical BCG tx/  recurrent 07/2015 (pTa) TCC  repear intravesical BCG tx   . History of hiatal hernia   . History of urethral stricture   . Nocturia     Past Surgical History:  Procedure Laterality Date  . CARDIAC CATHETERIZATION  1997   normal coronary arteries  . CARDIOVASCULAR STRESS TEST  12-14-2005   Low risk perfusion study/  very small scar in the inferior septum from mid ventricle to apex,  no ischemia/  mild inferior septal hypokinesis/  ef 58%  . CATARACT EXTRACTION W/ INTRAOCULAR LENS  IMPLANT, BILATERAL  2011  . COLONOSCOPY  last one 06/ 2018  . CYSTOSCOPY WITH BIOPSY N/A 08/12/2015   Procedure: CYSTOSCOPY WITH BIOPSY AND FULGERATION;  Surgeon: [name], MD;  Location[facility];  Service: Urology;  Laterality: N/A;  . CYSTOSCOPY WITH INJECTION N/A 06/29/2017   Procedure: CYSTOSCOPY WITH INJECTION OF INDOCYANINE GREEN DYE;  Surgeon: Alexis Frock, MD;  Location: [facility];  Service: Urology;  Laterality: N/A;  . CYSTOSCOPY WITH URETHRAL DILATATION N/A 03/21/2017   Procedure: CYSTOSCOPY;  Surgeon: [name], MD;  Location: [facility];  Service: Urology;  Laterality: N/A;  . ESOPHAGOGASTRODUODENOSCOPY  12-05-2014  . IR IMAGING GUIDED PORT INSERTION  02/07/2018  . IR REMOVAL TUN ACCESS W/ PORT  W/O FL MOD SED  04/14/2018  . TRANSTHORACIC ECHOCARDIOGRAM  01-31-2008   nomral LVF,  ef 55-65%/  mild pulmonic stenosis without regurg.,  peak transpulomonic valve gradient 40mmHg  . TRANSURETHRAL RESECTION OF BLADDER TUMOR N/A 12/31/2014   Procedure: TRANSURETHRAL RESECTION OF BLADDER TUMOR (TURBT);  Surgeon: Lowella Bandy, MD;  Location[facility];  Service: Urology;  Laterality: N/A;  . TRANSURETHRAL RESECTION OF BLADDER TUMOR N/A 03/21/2017    Procedure: POSSIBLE TRANSURETHRAL RESECTION OF BLADDER TUMOR (TURBT);  Surgeon: [name], MD;  Location: [facility];  Service: Urology;  Laterality: N/A;     reports that he quit smoking about 31 years ago. His smoking use included cigarettes. He has a 34.00 pack-year smoking history. He has never used smokeless tobacco. He reports current alcohol use of about 6.0 standard drinks of alcohol per week. He reports that he does not use drugs.  Allergies  Allergen Reactions  . Ciprofloxacin Hives  . Cefepime Rash    Family History  Problem Relation Age of Onset  . Alcoholism Father   . Colon cancer Neg Hx   . Colon polyps Neg Hx   . Diabetes Neg Hx   . Kidney disease Neg Hx   . Esophageal cancer Neg Hx   . Heart disease Neg Hx   . Gallbladder disease Neg Hx   . Allergic rhinitis Neg Hx   . Angioedema Neg Hx   . Asthma Neg Hx   . Atopy Neg Hx   . Eczema Neg Hx   . Immunodeficiency Neg Hx   . Urticaria Neg Hx      Prior to Admission medications   Medication Sig Start Date End Date Taking? Authorizing Provider  oxyCODONE (OXY IR/ROXICODONE) 5 MG immediate release tablet Take 1-2 tablets (5-10 mg total) by mouth every 4 (four) hours as needed for severe pain. 06/03/20  Yes [name], MD  cephALEXin (KEFLEX) 500 MG capsule Take 1 capsule (500 mg total) by mouth 4 (four) times daily. Patient not taking: Reported on 06/16/2020 06/22/19   Lily Kocher, PA-C  diphenhydrAMINE (BENADRYL) 25 MG tablet Take 25 mg by mouth every 6 (six) hours as needed for itching. Patient not taking: Reported on 03/28/2020    [provider]  EPINEPHrine 0.3 mg/0.3 mL IJ SOAJ injection INJECT INTO THIGH ASRNEEDED FOR ALLERGICSREACTION. Patient not taking: Reported on 03/28/2020 07/27/19   [provider]  famotidine (PEPCID) 40 MG tablet Take 1 tablet (40 mg total) by mouth daily. Patient not taking: Reported on 05/27/2020 04/30/19   Ladene Artist, MD    gabapentin (NEURONTIN) 300 MG capsule Take 1 capsule (300 mg total) by mouth at bedtime for 21 days. 09/27/19 10/24/19  Tanner, Lyndon Code., PA-C  HYDROmorphone (DILAUDID) 4 MG tablet Take 1 tablet (4 mg total) by mouth every 4 (four) hours as needed for severe pain. 06/03/20   [name], MD  lactose free nutrition (BOOST) LIQD Take 237 mLs by mouth 3 (three) times daily between meals.     [provider]  predniSONE (DELTASONE) 10 MG tablet Take 1 tablet (10 mg total) by mouth 2 (two) times daily with a meal. Patient not taking: Reported on 03/28/2020 11/09/19   Valentina Shaggy, MD  predniSONE (DELTASONE) 5 MG tablet 6 tab x 1 day, 5 tab x 1 day, 4 tab x 1 day, 3 tab x 1 day, 2 tab x 1 day, 1 tab x 1 day, stop Patient not taking:  Reported on 03/28/2020 09/27/19   Harle Stanford., PA-C  sulfamethoxazole-trimethoprim (BACTRIM DS) 800-160 MG tablet Take 1 tablet by mouth daily. Patient not taking: Reported on 03/28/2020 03/18/20   [name], MD  valACYclovir (VALTREX) 1000 MG tablet Take 1 tablet (1,000 mg total) by mouth 3 (three) times daily. Patient not taking: Reported on 03/28/2020 09/27/19   Harle Stanford., PA-C   Physical Exam: Vitals:   06/16/20 1000 06/16/20 1030 06/16/20 1050 06/16/20 1100  BP:  (!) 147/100  (!) 170/95  Pulse:  (!) 115  (!) 114  Resp: (!) 23   (!) 25  Temp:   (!) 100.8 F (38.2 C)   TempSrc:   Oral   SpO2:  99%  98%  Weight:      Height:       Constitutional: Chronically ill-appearing male lying in bed NAD, calm, comfortable, speaking full sentences. Eyes: PERRL, lids and conjunctivae normal ENMT: Mucous membranes are dry. Posterior pharynx clear of any exudate or lesions. Neck: normal, supple, no masses, no thyromegaly Respiratory: clear to auscultation bilaterally, no wheezing, no crackles. Normal respiratory effort. No accessory muscle use.  Cardiovascular: S2 sounds are normal, tachycardic rate, no murmurs / rubs / gallops. No extremity edema. 2+  pedal pulses. No carotid bruits.  Abdomen: Urostomy bag seen.  Cloudy urine, no tenderness, no masses palpated. No hepatosplenomegaly. Bowel sounds positive.  Musculoskeletal: no clubbing / cyanosis. No joint deformity upper and lower extremities. Good ROM, no contractures. Normal muscle tone.  Skin: no rashes, lesions, ulcers. No induration Neurologic: CN 2-12 grossly intact. Sensation intact, DTR normal. Strength 5/5 in all 4.  Psychiatric: Normal judgment and insight. Alert and oriented x 3. Normal mood.   Labs on Admission: I have personally reviewed following labs and imaging studies  CBC: Recent Labs  Lab 06/16/20 0745  WBC 12.7*  NEUTROABS 10.6*  HGB 13.7  HCT 42.2  MCV 110.2*  PLT 400   Basic Metabolic Panel: Recent Labs  Lab 06/16/20 0745  NA 134*  K 3.8  CL 103  CO2 22  GLUCOSE 153*  BUN 12  CREATININE 1.16  CALCIUM 8.6*   GFR: Estimated Creatinine Clearance: 62.2 mL/min (by C-G formula based on SCr of 1.16 mg/dL). Liver Function Tests: Recent Labs  Lab 06/16/20 0745  AST 16  ALT 12  ALKPHOS 56  BILITOT 0.9  PROT 5.9*  ALBUMIN 3.1*   Recent Labs  Lab 06/16/20 0745  LIPASE 20   No results for input(s): AMMONIA in the last 168 hours. Coagulation Profile: Recent Labs  Lab 06/16/20 0745  INR 1.1   Cardiac Enzymes: No results for input(s): CKTOTAL, CKMB, CKMBINDEX, TROPONINI in the last 168 hours. BNP (last 3 results) No results for input(s): PROBNP in the last 8760 hours. HbA1C: No results for input(s): HGBA1C in the last 72 hours. CBG: No results for input(s): GLUCAP in the last 168 hours. Lipid Profile: No results for input(s): CHOL, HDL, LDLCALC, TRIG, CHOLHDL, LDLDIRECT in the last 72 hours. Thyroid Function Tests: No results for input(s): TSH, T4TOTAL, FREET4, T3FREE, THYROIDAB in the last 72 hours. Anemia Panel: No results for input(s): VITAMINB12, FOLATE, FERRITIN, TIBC, IRON, RETICCTPCT in the last 72 hours. Urine analysis:      Component Value Date/Time   COLORURINE YELLOW 06/16/2020 0746   APPEARANCEUR HAZY (A) 06/16/2020 0746   LABSPEC 1.009 06/16/2020 0746   PHURINE 7.0 06/16/2020 0746   GLUCOSEU NEGATIVE 06/16/2020 0746   HGBUR SMALL (A) 06/16/2020 0746  BILIRUBINUR NEGATIVE 06/16/2020 [place] 06/16/2020 0746   PROTEINUR 100 (A) 06/16/2020 0746   NITRITE NEGATIVE 06/16/2020 0746   LEUKOCYTESUR LARGE (A) 06/16/2020 0746    Radiological Exams on Admission: CT ABDOMEN PELVIS W CONTRAST  Result Date: 06/16/2020 CLINICAL DATA:  79 year old male with history of abdominal pain and fever. Cough and back pain with body aches for the past 2 days. EXAM: CT ABDOMEN AND PELVIS WITH CONTRAST TECHNIQUE: Multidetector CT imaging of the abdomen and pelvis was performed using the standard protocol following bolus administration of intravenous contrast. CONTRAST:  117mL OMNIPAQUE IOHEXOL 300 MG/ML  SOLN COMPARISON:  CT the abdomen and pelvis 03/05/2020. FINDINGS: Lower chest: Trace right pleural effusion lying dependently. Aortic atherosclerosis. Calcifications of the aortic valve. Hepatobiliary: Diffuse low attenuation throughout the hepatic parenchyma, indicative of hepatic steatosis. Multiple small low-attenuation lesions scattered throughout the hepatic parenchyma, compatible with simple cysts. No aggressive appearing hepatic lesions. No intra or extrahepatic biliary ductal dilatation. Gallbladder is normal in appearance. Pancreas: No pancreatic mass. No pancreatic ductal dilatation. No pancreatic or peripancreatic fluid collections or inflammatory changes. Spleen: Unremarkable. Adrenals/Urinary Tract: Subcentimeter low-attenuation lesion in the upper pole of the right kidney, too small to characterize, but stable compared to the prior study and statistically likely to represent a cyst. No suspicious lesions in either kidney. Bilateral adrenal glands are normal in appearance. Chronic mild to moderate left  hydroureteronephrosis increased slightly compared to the prior study. No right hydroureteronephrosis. Status post radical cystoprostatectomy with right lower quadrant urostomy. Stomach/Bowel: Normal appearance of the stomach. Small periampullary duodenal diverticulum, without surrounding inflammatory changes to suggest an associated diverticulitis at this time. No pathologic dilatation of small bowel or colon. The appendix is not confidently identified and may be surgically absent. Regardless, there are no inflammatory changes noted adjacent to the cecum to suggest the presence of an acute appendicitis at this time. Vascular/Lymphatic: Aortic atherosclerosis, without evidence of aneurysm or dissection in the abdominal or pelvic vasculature. No lymphadenopathy noted in the abdomen or pelvis. Reproductive: Status post radical prostatectomy. Other: No significant volume of ascites.  No pneumoperitoneum. Musculoskeletal: Expansile lytic lesion in the right inferior pubic ramus measuring 6.0 x 3.0 cm, similar to the prior examination. IMPRESSION: 1. 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 2. No other definite sites of metastatic disease identified in the abdomen or pelvis on today's examination. 3. Status post radical cystoprostatectomy with right lower quadrant urostomy. Worsening chronic mild-to-moderate left hydroureteronephrosis compared to the prior study. 4. Trace right pleural effusion new compared to the prior examination. 5. Hepatic steatosis. 6. Aortic atherosclerosis. 7. There are calcifications of the aortic valve. Echocardiographic correlation for evaluation of potential valvular dysfunction may be warranted if clinically indicated. 8. Additional incidental findings, as above. Electronically Signed   By: Vinnie Langton M.D.   On: 06/16/2020 10:51   DG Chest Portable 1 View  Result Date: 06/16/2020 CLINICAL  DATA:  Cough, fever. EXAM: PORTABLE CHEST 1 VIEW COMPARISON:  February 17, 2020. FINDINGS: The heart size and mediastinal contours are within normal limits. Both lungs are clear. The visualized skeletal structures are unremarkable. IMPRESSION: No active disease. Electronically Signed   By: Marijo Conception M.D.   On: 06/16/2020 08:26   Assessment/Plan Active Problems:   Malignant neoplasm of urinary bladder (HCC)   Status post ileal conduit (HCC)   Sepsis secondary to UTI ([place])   Hyperglycemia   UTI (  urinary tract infection)   Status post chemotherapy   GERD (gastroesophageal reflux disease)   Sinus tachycardia   Fever   Dehydration   Rigors   Severe sepsis (HCC)   Elevated lactic acid level   1. Severe sepsis-secondary to urinary tract infection.  IV fluid resuscitation broad-spectrum IV antibiotics and follow urine culture to completion. 2. History of bladder cancer-patient reports that he has recently completed radiation treatments will follow up with his oncologist later this week. 3. Hyperglycemia-check hemoglobin A1c likely reactive secondary to severe sepsis. 4. GERD - Protonix ordered for GI protection 5. Dehydration-continue IV fluids as ordered. 6. Elevated lactic acid test treated with IV fluids and resolved. 7. Urostomy in place-urostomy care orders.  DVT prophylaxis: Lovenox Code Status: Full Family Communication: Plan of care updated with patient at bedside, verbalized understanding Disposition Plan: Home when medically stable Consults called: N/A  Admission status: INP   Amayrani Bennick MD [facility] How to contact the [facility] Attending or Consulting provider [place] or covering provider during after hours [place], for this patient?  1. Check the care team in [facility] and look for a) attending/consulting [facility] provider listed and b) the [facility] team listed 2. Log into [email or web address] and use [place]'s universal password to access. If you do not have the password, please  contact the hospital operator. 3. Locate the [facility] provider you are looking for under [facility] and page to a number that you can be directly reached. 4. If you still have difficulty reaching the provider, please page the [facility] (Director on Call) for the Hospitalists listed on amion for assistance.   If 7PM-7AM, please contact night-coverage [email or web address] Password [facility]  06/16/2020, 11:42 AM

## 2020-06-17 ENCOUNTER — Telehealth: Payer: Self-pay

## 2020-06-17 ENCOUNTER — Inpatient Hospital Stay: Payer: Medicare Other

## 2020-06-17 ENCOUNTER — Inpatient Hospital Stay: Payer: Medicare Other | Admitting: Oncology

## 2020-06-17 DIAGNOSIS — R6889 Other general symptoms and signs: Secondary | ICD-10-CM

## 2020-06-17 LAB — COMPREHENSIVE METABOLIC PANEL
ALT: 11 U/L (ref 0–44)
AST: 14 U/L — ABNORMAL LOW (ref 15–41)
Albumin: 2.4 g/dL — ABNORMAL LOW (ref 3.5–5.0)
Alkaline Phosphatase: 40 U/L (ref 38–126)
Anion gap: 7 (ref 5–15)
BUN: 12 mg/dL (ref 8–23)
CO2: 25 mmol/L (ref 22–32)
Calcium: 8 mg/dL — ABNORMAL LOW (ref 8.9–10.3)
Chloride: 103 mmol/L (ref 98–111)
Creatinine, Ser: 1.06 mg/dL (ref 0.61–1.24)
GFR, Estimated: >60 mL/min (ref >60)
Glucose, Bld: 117 mg/dL — ABNORMAL HIGH (ref 70–99)
Potassium: 3.4 mmol/L — ABNORMAL LOW (ref 3.5–5.1)
Sodium: 135 mmol/L (ref 135–145)
Total Bilirubin: 0.7 mg/dL (ref 0.3–1.2)
Total Protein: 4.8 g/dL — ABNORMAL LOW (ref 6.5–8.1)

## 2020-06-17 LAB — CBC WITH DIFFERENTIAL/PLATELET
Abs Immature Granulocytes: 0.1 10*3/uL — ABNORMAL HIGH (ref 0.00–0.07)
Basophils Absolute: 0 10*3/uL (ref 0.0–0.1)
Basophils Relative: 0 %
Eosinophils Absolute: 0.4 10*3/uL (ref 0.0–0.5)
Eosinophils Relative: 3 %
HCT: 36.2 % — ABNORMAL LOW (ref 39.0–52.0)
Hemoglobin: 11.8 g/dL — ABNORMAL LOW (ref 13.0–17.0)
Immature Granulocytes: 1 %
Lymphocytes Relative: 3 %
Lymphs Abs: 0.3 10*3/uL — ABNORMAL LOW (ref 0.7–4.0)
MCH: 35.8 pg — ABNORMAL HIGH (ref 26.0–34.0)
MCHC: 32.6 g/dL (ref 30.0–36.0)
MCV: 109.7 fL — ABNORMAL HIGH (ref 80.0–100.0)
Monocytes Absolute: 2.1 10*3/uL — ABNORMAL HIGH (ref 0.1–1.0)
Monocytes Relative: 17 %
Neutro Abs: 9.8 10*3/uL — ABNORMAL HIGH (ref 1.7–7.7)
Neutrophils Relative %: 76 %
Platelets: 146 10*3/uL — ABNORMAL LOW (ref 150–400)
RBC: 3.3 MIL/uL — ABNORMAL LOW (ref 4.22–5.81)
RDW: 13 % (ref 11.5–15.5)
WBC: 12.8 10*3/uL — ABNORMAL HIGH (ref 4.0–10.5)
nRBC: 0 % (ref 0.0–0.2)

## 2020-06-17 LAB — MAGNESIUM: Magnesium: 2 mg/dL (ref 1.7–2.4)

## 2020-06-17 MED ORDER — POTASSIUM CHLORIDE IN NACL 20-0.9 MEQ/L-% IV SOLN: INTRAVENOUS | Status: DC

## 2020-06-17 MED ORDER — POTASSIUM CHLORIDE CRYS ER 20 MEQ PO TBCR
40.0000 meq | EXTENDED_RELEASE_TABLET | Freq: Once | ORAL | Status: AC
  Administered 2020-06-17: 40 meq via ORAL
  Filled 2020-06-17: qty 2

## 2020-06-17 NOTE — Progress Notes (Addendum)
Progress Note  [facility]  [name] ZDG:[id number] DOB: [birth year] DOA: 06/16/2020  PCP: [name], MD  Oncology: Alen Blew Patient coming from: Home   I have personally briefly reviewed patient's old medical records in [place]  Brief Admission History  [name] is a 79 y.o. male with medical history significant for bladder cancer just recently completed radiation, chronic urostomy in place, HTN, BPH, GERD presented to ED complaining of cough and chest congestion, fever and body aches with shaking chills rigors that have gotten a little more little worse over the past 24 hours.  He has chronic back pain that has not changed.  Is having some foul smell from smell from the urostomy bag.  His urine appeared more cloudy recently.  He had a temperature of 101 yesterday.  He denies nausea vomiting and diarrhea.  He denies chest pain or shortness of breath.  ED Course: Patient had a temperature of 100.8, pulse 114, blood pressure 170/95, respirations 25, pulse ox 96% on room air.  His sodium was 134, glucose 153, Calcium 8.6, albumin 3.1, lactic acid 2.9.  WBC 12.7, hemoglobin 13.7, hematocrit 42.2, platelet count 166.  Patient was bolused IV fluids and repeat lactic acid was 1.7.  SARS 2 was 2 coronavirus test negative influenza AMB test negative RSV test negative.  Urinalysis positive for large leukocytes small amount of hemoglobin greater than 50 WBC per high-power field.  Portable chest x-ray no active disease seen.  Abdomen pelvis no acute findings.  Patient was given IV bolus fluids and started on broad-spectrum antibiotics and admission was requested for for admission was requested for further management.   reports that he quit smoking about 31 years ago. His smoking use included cigarettes. He has a 34.00 pack-year smoking history. He has never used smokeless tobacco. He reports current alcohol use of about 6.0 standard drinks of alcohol per week. He reports that he  does not use drugs.  Allergies  Allergen Reactions  . Ciprofloxacin Hives  . Cefepime Rash    Family History  Problem Relation Age of Onset  . Alcoholism Father   . Colon cancer Neg Hx   . Colon polyps Neg Hx   . Diabetes Neg Hx   . Kidney disease Neg Hx   . Esophageal cancer Neg Hx   . Heart disease Neg Hx   . Gallbladder disease Neg Hx   . Allergic rhinitis Neg Hx   . Angioedema Neg Hx   . Asthma Neg Hx   . Atopy Neg Hx   . Eczema Neg Hx   . Immunodeficiency Neg Hx   . Urticaria Neg Hx     Physical Exam: Vitals:   06/17/20 0223 06/17/20 0600 06/17/20 1011 06/17/20 1334  BP: (!) 150/75 140/75 (!) 120/52 (!) 121/58  Pulse: 100 96 90 62  Resp:  18 18 17   Temp: 100 F (37.8 C) 98.9 F (37.2 C) 98.1 F (36.7 C) 98 F (36.7 C)  TempSrc:   Oral Oral  SpO2: 94% 98% 98% 95%  Weight:      Height:       Constitutional: Chronically ill-appearing male lying in bed NAD, calm, comfortable, speaking full sentences. Eyes: PERRL, lids and conjunctivae normal ENMT: Mucous membranes are dry. Posterior pharynx clear of any exudate or lesions. Neck: normal, supple, no masses, no thyromegaly Respiratory: clear to auscultation bilaterally, no wheezing, no crackles. Normal respiratory effort. No accessory muscle use.  Cardiovascular: S2 sounds are normal, tachycardic rate,  no murmurs / rubs / gallops. No extremity edema. 2+ pedal pulses. No carotid bruits.  Abdomen: Urostomy bag seen.  Cloudy urine, no tenderness, no masses palpated. No hepatosplenomegaly. Bowel sounds positive.  Musculoskeletal: no clubbing / cyanosis. No joint deformity upper and lower extremities. Good ROM, no contractures. Normal muscle tone.  Skin: no rashes, lesions, ulcers. No induration Neurologic: CN 2-12 grossly intact. Sensation intact, DTR normal. Strength 5/5 in all 4.  Psychiatric: Normal judgment and insight. Alert and oriented x 3. Normal mood.   Labs on Admission: I have personally reviewed following  labs and imaging studies  CBC: Recent Labs  Lab 06/16/20 0745 06/17/20 0618  WBC 12.7* 12.8*  NEUTROABS 10.6* 9.8*  HGB 13.7 11.8*  HCT 42.2 36.2*  MCV 110.2* 109.7*  PLT 166 818*   Basic Metabolic Panel: Recent Labs  Lab 06/16/20 0745 06/17/20 0618  NA 134* 135  K 3.8 3.4*  CL 103 103  CO2 22 25  GLUCOSE 153* 117*  BUN 12 12  CREATININE 1.16 1.06  CALCIUM 8.6* 8.0*  MG  --  2.0   GFR: Estimated Creatinine Clearance: 67.3 mL/min (by C-G formula based on SCr of 1.06 mg/dL). Liver Function Tests: Recent Labs  Lab 06/16/20 0745 06/17/20 0618  AST 16 14*  ALT 12 11  ALKPHOS 56 40  BILITOT 0.9 0.7  PROT 5.9* 4.8*  ALBUMIN 3.1* 2.4*   Recent Labs  Lab 06/16/20 0745  LIPASE 20   No results for input(s): AMMONIA in the last 168 hours. Coagulation Profile: Recent Labs  Lab 06/16/20 0745  INR 1.1   Cardiac Enzymes: No results for input(s): CKTOTAL, CKMB, CKMBINDEX, TROPONINI in the last 168 hours. BNP (last 3 results) No results for input(s): PROBNP in the last 8760 hours. HbA1C: Recent Labs    06/16/20 0745  HGBA1C 5.1   CBG: No results for input(s): GLUCAP in the last 168 hours. Lipid Profile: No results for input(s): CHOL, HDL, LDLCALC, TRIG, CHOLHDL, LDLDIRECT in the last 72 hours. Thyroid Function Tests: No results for input(s): TSH, T4TOTAL, FREET4, T3FREE, THYROIDAB in the last 72 hours. Anemia Panel: No results for input(s): VITAMINB12, FOLATE, FERRITIN, TIBC, IRON, RETICCTPCT in the last 72 hours. Urine analysis:    Component Value Date/Time   COLORURINE YELLOW 06/16/2020 0746   APPEARANCEUR HAZY (A) 06/16/2020 0746   LABSPEC 1.009 06/16/2020 0746   PHURINE 7.0 06/16/2020 0746   GLUCOSEU NEGATIVE 06/16/2020 0746   HGBUR SMALL (A) 06/16/2020 0746   BILIRUBINUR NEGATIVE 06/16/2020 0746   KETONESUR NEGATIVE 06/16/2020 0746   PROTEINUR 100 (A) 06/16/2020 0746   NITRITE NEGATIVE 06/16/2020 0746   LEUKOCYTESUR LARGE (A) 06/16/2020 0746     Radiological Exams on Admission: CT ABDOMEN PELVIS W CONTRAST  Result Date: 06/16/2020 CLINICAL DATA:  79 year old male with history of abdominal pain and fever. Cough and back pain with body aches for the past 2 days. EXAM: CT ABDOMEN AND PELVIS WITH CONTRAST TECHNIQUE: Multidetector CT imaging of the abdomen and pelvis was performed using the standard protocol following bolus administration of intravenous contrast. CONTRAST:  184mL OMNIPAQUE IOHEXOL 300 MG/ML  SOLN COMPARISON:  CT the abdomen and pelvis 03/05/2020. FINDINGS: Lower chest: Trace right pleural effusion lying dependently. Aortic atherosclerosis. Calcifications of the aortic valve. Hepatobiliary: Diffuse low attenuation throughout the hepatic parenchyma, indicative of hepatic steatosis. Multiple small low-attenuation lesions scattered throughout the hepatic parenchyma, compatible with simple cysts. No aggressive appearing hepatic lesions. No intra or extrahepatic biliary ductal dilatation. Gallbladder is normal  in appearance. Pancreas: No pancreatic mass. No pancreatic ductal dilatation. No pancreatic or peripancreatic fluid collections or inflammatory changes. Spleen: Unremarkable. Adrenals/Urinary Tract: Subcentimeter low-attenuation lesion in the upper pole of the right kidney, too small to characterize, but stable compared to the prior study and statistically likely to represent a cyst. No suspicious lesions in either kidney. Bilateral adrenal glands are normal in appearance. Chronic mild to moderate left hydroureteronephrosis increased slightly compared to the prior study. No right hydroureteronephrosis. Status post radical cystoprostatectomy with right lower quadrant urostomy. Stomach/Bowel: Normal appearance of the stomach. Small periampullary duodenal diverticulum, without surrounding inflammatory changes to suggest an associated diverticulitis at this time. No pathologic dilatation of small bowel or colon. The appendix is not  confidently identified and may be surgically absent. Regardless, there are no inflammatory changes noted adjacent to the cecum to suggest the presence of an acute appendicitis at this time. Vascular/Lymphatic: Aortic atherosclerosis, without evidence of aneurysm or dissection in the abdominal or pelvic vasculature. No lymphadenopathy noted in the abdomen or pelvis. Reproductive: Status post radical prostatectomy. Other: No significant volume of ascites.  No pneumoperitoneum. Musculoskeletal: Expansile lytic lesion in the right inferior pubic ramus measuring 6.0 x 3.0 cm, similar to the prior examination. IMPRESSION: 1. 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 2. No other definite sites of metastatic disease identified in the abdomen or pelvis on today's examination. 3. Status post radical cystoprostatectomy with right lower quadrant urostomy. Worsening chronic mild-to-moderate left hydroureteronephrosis compared to the prior study. 4. Trace right pleural effusion new compared to the prior examination. 5. Hepatic steatosis. 6. Aortic atherosclerosis. 7. There are calcifications of the aortic valve. Echocardiographic correlation for evaluation of potential valvular dysfunction may be warranted if clinically indicated. 8. Additional incidental findings, as above. Electronically Signed   By: Vinnie Langton M.D.   On: 06/16/2020 10:51   DG Chest Portable 1 View  Result Date: 06/16/2020 CLINICAL DATA:  Cough, fever. EXAM: PORTABLE CHEST 1 VIEW COMPARISON:  February 17, 2020. FINDINGS: The heart size and mediastinal contours are within normal limits. Both lungs are clear. The visualized skeletal structures are unremarkable. IMPRESSION: No active disease. Electronically Signed   By: Marijo Conception M.D.   On: 06/16/2020 08:26   Assessment/Plan Active Problems:   Malignant neoplasm of urinary bladder (HCC)   Status post ileal  conduit (HCC)   Sepsis secondary to UTI (HCC)   Hyperglycemia   UTI (urinary tract infection)   Status post chemotherapy   GERD (gastroesophageal reflux disease)   Sinus tachycardia   Fever   Dehydration   Rigors   Severe sepsis (HCC)   Elevated lactic acid level  1. Severe sepsis-secondary to urinary tract infection.  IV fluid resuscitation broad-spectrum IV antibiotics and follow urine culture to completion. Continue current management pending cultures.  2. Pseudomonas UTI - continue IV aztreonam and supportive care.  3. History of bladder cancer-patient reports that he has recently completed radiation treatments will follow up with his oncologist.  I notified his oncologist that he would not be at appt today and to have it rescheduled.  4. Hyperglycemia-hemoglobin A1c 5.1% likely reactive to severe sepsis and stress. 5. GERD - Protonix ordered for GI protection.  6. Fever - treating symptoms.   7. Dehydration-continue IV fluids as ordered. 8. Elevated lactic acid test treated with IV fluids and resolved. 9. Urostomy in place-urostomy care orders.  DVT prophylaxis: Lovenox Code Status: Full  Family Communication: spoke with wife by telephone, spoke with his oncologist Dr Alen Blew Disposition Plan: Home when medically stable Consults called: N/A  Admission status: INP   Derrisha Foos MD [facility] How to contact the [facility] Attending or Consulting provider [place] or covering provider during after hours [place], for this patient?  1. Check the care team in [facility] and look for a) attending/consulting [facility] provider listed and b) the [facility] team listed 2. Log into [email or web address] and use [place]'s universal password to access. If you do not have the password, please contact the hospital operator. 3. Locate the [facility] provider you are looking for under [facility] and page to a number that you can be directly reached. 4. If you still have difficulty reaching the provider, please  page the [facility] (Director on Call) for the Hospitalists listed on amion for assistance.   If 7PM-7AM, please contact night-coverage [email or web address] Password [email or web address]  06/17/2020, 1:47 PM

## 2020-06-17 NOTE — Telephone Encounter (Signed)
Canceled patient's appointment for today. Patient is currently admitted. Spoke with patient's wife. Advised to reach out once he has been discharged so he can be rescheduled. Patient's wife verbalized understanding.

## 2020-06-18 DIAGNOSIS — R739 Hyperglycemia, unspecified: Secondary | ICD-10-CM

## 2020-06-18 DIAGNOSIS — C679 Malignant neoplasm of bladder, unspecified: Secondary | ICD-10-CM

## 2020-06-18 DIAGNOSIS — A498 Other bacterial infections of unspecified site: Secondary | ICD-10-CM | POA: Diagnosis present

## 2020-06-18 DIAGNOSIS — R7989 Other specified abnormal findings of blood chemistry: Secondary | ICD-10-CM

## 2020-06-18 LAB — CBC WITH DIFFERENTIAL/PLATELET
Abs Immature Granulocytes: 0.03 10*3/uL (ref 0.00–0.07)
Basophils Absolute: 0 10*3/uL (ref 0.0–0.1)
Basophils Relative: 0 %
Eosinophils Absolute: 0 10*3/uL (ref 0.0–0.5)
Eosinophils Relative: 1 %
HCT: 35.9 % — ABNORMAL LOW (ref 39.0–52.0)
Hemoglobin: 11.4 g/dL — ABNORMAL LOW (ref 13.0–17.0)
Immature Granulocytes: 0 %
Lymphocytes Relative: 4 %
Lymphs Abs: 0.3 10*3/uL — ABNORMAL LOW (ref 0.7–4.0)
MCH: 35.4 pg — ABNORMAL HIGH (ref 26.0–34.0)
MCHC: 31.8 g/dL (ref 30.0–36.0)
MCV: 111.5 fL — ABNORMAL HIGH (ref 80.0–100.0)
Monocytes Absolute: 1.2 10*3/uL — ABNORMAL HIGH (ref 0.1–1.0)
Monocytes Relative: 17 %
Neutro Abs: 5.4 10*3/uL (ref 1.7–7.7)
Neutrophils Relative %: 78 %
Platelets: 140 10*3/uL — ABNORMAL LOW (ref 150–400)
RBC: 3.22 MIL/uL — ABNORMAL LOW (ref 4.22–5.81)
RDW: 12.8 % (ref 11.5–15.5)
WBC: 7 10*3/uL (ref 4.0–10.5)
nRBC: 0 % (ref 0.0–0.2)

## 2020-06-18 LAB — URINE CULTURE: Culture: >=100000 — AB

## 2020-06-18 LAB — COMPREHENSIVE METABOLIC PANEL
ALT: 12 U/L (ref 0–44)
AST: 12 U/L — ABNORMAL LOW (ref 15–41)
Albumin: 2.3 g/dL — ABNORMAL LOW (ref 3.5–5.0)
Alkaline Phosphatase: 40 U/L (ref 38–126)
Anion gap: 5 (ref 5–15)
BUN: 12 mg/dL (ref 8–23)
CO2: 25 mmol/L (ref 22–32)
Calcium: 8 mg/dL — ABNORMAL LOW (ref 8.9–10.3)
Chloride: 108 mmol/L (ref 98–111)
Creatinine, Ser: 0.92 mg/dL (ref 0.61–1.24)
GFR, Estimated: >60 mL/min (ref >60)
Glucose, Bld: 94 mg/dL (ref 70–99)
Potassium: 4 mmol/L (ref 3.5–5.1)
Sodium: 138 mmol/L (ref 135–145)
Total Bilirubin: 0.6 mg/dL (ref 0.3–1.2)
Total Protein: 4.7 g/dL — ABNORMAL LOW (ref 6.5–8.1)

## 2020-06-18 LAB — MAGNESIUM: Magnesium: 2 mg/dL (ref 1.7–2.4)

## 2020-06-18 MED ORDER — SODIUM CHLORIDE 0.9 % IV SOLN
1.0000 g | Freq: Three times a day (TID) | INTRAVENOUS | Status: AC
  Administered 2020-06-18 – 2020-06-19 (×3): 1 g via INTRAVENOUS
  Filled 2020-06-18 (×3): qty 1

## 2020-06-18 MED ORDER — LOPERAMIDE HCL 2 MG PO CAPS
2.0000 mg | ORAL_CAPSULE | Freq: Four times a day (QID) | ORAL | Status: DC | PRN
  Administered 2020-06-18: 2 mg via ORAL
  Filled 2020-06-18: qty 1

## 2020-06-18 NOTE — Progress Notes (Signed)
Patient Demographics:    [name], is a 79 y.o. male, DOB - Aug 06, 1941, JEH:[id number]  Admit date - 06/16/2020   Admitting Physician Murlean Iba, MD  Outpatient Primary MD for the patient is [name], MD  LOS - 2   Chief Complaint  Patient presents with  . Cough        Subjective:    Alieu Finnigan today has no fevers, no emesis,  No chest pain,    -Complains of generalized weakness and fatigue as well as chronic -Had loose stools presumably from antibiotic  Assessment  & Plan :    Active Problems:   Pseudomonas aeruginosa infection   Malignant neoplasm of urinary bladder (HCC)   Status post ileal conduit (HCC)   Sepsis secondary to UTI (HCC)   Hyperglycemia   UTI (urinary tract infection)   Status post chemotherapy   GERD (gastroesophageal reflux disease)   Sinus tachycardia   Fever   Dehydration   Rigors   Severe sepsis (HCC)   Elevated lactic acid level  Brief Summary:- 79 y.o. male with medical history significant for bladder cancer just recently completed radiation, chronic urostomy in place, HTN, BPH, GERD presented to ED complaining of cough and chest congestion, fever and body aches with shaking chills rigors admitted on 06/16/2020 and found to have severe sepsis secondary to Pseudomonas UTI  A/p 1)Severe sepsis-secondary to complicated Pseudomonas urinary tract infection--  In the patient with urostomy bag -Hemodynamics improved with IV fluid resuscitation -Urine culture suggest Pseudomonas is resistant to first generation cephalosporins --Treat with iv meropenem starting 06/18/2020 -Okay to stop aztreonam on 06/18/2020 -Possible discharge home on oral fosfomycin in 1 to 2 days -No further fevers  2)History of bladder cancer-patient reports that he has recently completed radiation treatments will follow up with his oncologist.     3)Urostomy in place-urostomy  care orders.  DVT prophylaxis: Lovenox Code Status: Full Family Communication:   wife and  his oncologist Dr Alen Blew aware of plan of care Disposition Plan: Home in 1 to 2 days Consults called: N/A  Admission status: INP   Disposition/Need for in-Hospital Stay- patient unable to be discharged at this time due to --IV meropenem for resistant Pseudomonas and complicated UTI  Status is: Inpatient  Remains inpatient appropriate because:-IV meropenem for resistant Pseudomonas and complicated UTI   Disposition: The patient is from: Home              Anticipated d/c is to: Home              Anticipated d/c date is: 1 day              Patient currently is not medically stable to d/c. Barriers: Not Clinically Stable- -IV meropenem for resistant Pseudomonas and complicated UTI  Consults  :  na   Lab Results  Component Value Date   PLT 140 (L) 06/18/2020    Inpatient Medications  Scheduled Meds: . enoxaparin (LOVENOX) injection  40 mg Subcutaneous Q24H  . lactose free nutrition  237 mL Oral TID BM   Continuous Infusions: . 0.9 % NaCl with KCl 20 mEq / L 125 mL/hr at 06/18/20 0549  . meropenem (MERREM) IV     PRN Meds:.acetaminophen **OR** acetaminophen,  albuterol, HYDROmorphone (DILAUDID) injection, hydrOXYzine, labetalol, loperamide, ondansetron **OR** ondansetron (ZOFRAN) IV, oxyCODONE    Anti-infectives (From admission, onward)   Start     Dose/Rate Route Frequency Ordered Stop   06/18/20 1800  meropenem (MERREM) 1 g in sodium chloride 0.9 % 100 mL IVPB        1 g 200 mL/hr over 30 Minutes Intravenous Every 8 hours 06/18/20 1632 06/19/20 1759   06/17/20 1200  vancomycin (VANCOREADY) IVPB 1500 mg/300 mL  Status:  Discontinued        1,500 mg 150 mL/hr over 120 Minutes Intravenous Every 24 hours 06/16/20 1235 06/18/20 0908   06/16/20 1800  aztreonam (AZACTAM) 2 g in sodium chloride 0.9 % 100 mL IVPB  Status:  Discontinued        2 g 200 mL/hr over 30 Minutes Intravenous  Every 8 hours 06/16/20 1302 06/18/20 1632   06/16/20 0930  vancomycin (VANCOREADY) IVPB 2000 mg/400 mL        2,000 mg 200 mL/hr over 120 Minutes Intravenous  Once 06/16/20 0910 06/16/20 1414   06/16/20 0915  aztreonam (AZACTAM) 2 g in sodium chloride 0.9 % 100 mL IVPB        2 g 200 mL/hr over 30 Minutes Intravenous  Once 06/16/20 0900 06/16/20 1030   06/16/20 0915  metroNIDAZOLE (FLAGYL) IVPB 500 mg        500 mg 100 mL/hr over 60 Minutes Intravenous  Once 06/16/20 0900 06/16/20 1228   06/16/20 0915  vancomycin (VANCOCIN) IVPB 1000 mg/200 mL premix  Status:  Discontinued        1,000 mg 200 mL/hr over 60 Minutes Intravenous  Once 06/16/20 0900 06/16/20 0910        Objective:   Vitals:   06/17/20 1334 06/17/20 2120 06/18/20 0450 06/18/20 0700  BP: (!) 121/58 (!) 150/62 139/81 123/80  Pulse: 62 87 (!) 42 (!) 104  Resp: 17 18 18 16   Temp: 98 F (36.7 C) 98.9 F (37.2 C) 99.1 F (37.3 C) (!) 97.5 F (36.4 C)  TempSrc: Oral  Oral Oral  SpO2: 95% 95% 97%   Weight:      Height:        Wt Readings from Last 3 Encounters:  06/16/20 94 kg  03/28/20 100.6 kg  03/18/20 98.1 kg     Intake/Output Summary (Last 24 hours) at 06/18/2020 1635 Last data filed at 06/18/2020 1235 Gross per 24 hour  Intake 910 ml  Output 2850 ml  Net -1940 ml     Physical Exam  Gen:- Awake Alert, no acute distress HEENT:- [place].AT, No sclera icterus Neck-Supple Neck,No JVD,.  Lungs-  CTAB , fair symmetrical air movement CV- S1, S2 normal, regular  Abd-  +ve B.Sounds, Abd Soft, No tenderness, urostomy bag    Extremity/Skin:- No  edema, pedal pulses present  Psych-affect is appropriate, oriented x3 Neuro-no new focal deficits, no tremors   Data Review:   Micro Results Recent Results (from the past 240 hour(s))  Resp Panel by RT PCR (RSV, Flu A&B, Covid) - Nasopharyngeal Swab     Status: None   Collection Time: 06/16/20  7:29 AM   Specimen: Nasopharyngeal Swab  Result Value Ref Range Status     SARS Coronavirus 2 by RT PCR NEGATIVE NEGATIVE Final    Comment: (NOTE) SARS-CoV-2 target nucleic acids are NOT DETECTED.  The SARS-CoV-2 RNA is generally detectable in upper respiratoy specimens during the acute phase of infection. The lowest concentration of SARS-CoV-2 viral  copies this assay can detect is 131 copies/mL. A negative result does not preclude SARS-Cov-2 infection and should not be used as the sole basis for treatment or other patient management decisions. A negative result may occur with  improper specimen collection/handling, submission of specimen other than nasopharyngeal swab, presence of viral mutation(s) within the areas targeted by this assay, and inadequate number of viral copies (<131 copies/mL). A negative result must be combined with clinical observations, patient history, and epidemiological information. The expected result is Negative.  Fact Sheet for Patients:  PinkCheek.be  Fact Sheet for Healthcare Providers:  GravelBags.it  This test is no t yet approved or cleared by the [place] FDA and  has been authorized for detection and/or diagnosis of SARS-CoV-2 by FDA under an Emergency Use Authorization (EUA). This EUA will remain  in effect (meaning this test can be used) for the duration of the COVID-19 declaration under Section 564(b)(1) of the Act, 21 U.S.C. section 360bbb-3(b)(1), unless the authorization is terminated or revoked sooner.     Influenza A by PCR NEGATIVE NEGATIVE Final   Influenza B by PCR NEGATIVE NEGATIVE Final    Comment: (NOTE) The Xpert Xpress SARS-CoV-2/FLU/RSV assay is intended as an aid in  the diagnosis of influenza from Nasopharyngeal swab specimens and  should not be used as a sole basis for treatment. Nasal washings and  aspirates are unacceptable for Xpert Xpress SARS-CoV-2/FLU/RSV  testing.  Fact Sheet for  Patients: PinkCheek.be  Fact Sheet for Healthcare Providers: GravelBags.it  This test is not yet approved or cleared by the [place] FDA and  has been authorized for detection and/or diagnosis of SARS-CoV-2 by  FDA under an Emergency Use Authorization (EUA). This EUA will remain  in effect (meaning this test can be used) for the duration of the  Covid-19 declaration under Section 564(b)(1) of the Act, 21  U.S.C. section 360bbb-3(b)(1), unless the authorization is  terminated or revoked.    Respiratory Syncytial Virus by PCR NEGATIVE NEGATIVE Final    Comment: (NOTE) Fact Sheet for Patients: PinkCheek.be  Fact Sheet for Healthcare Providers: GravelBags.it  This test is not yet approved or cleared by the [place] FDA and  has been authorized for detection and/or diagnosis of SARS-CoV-2 by  FDA under an Emergency Use Authorization (EUA). This EUA will remain  in effect (meaning this test can be used) for the duration of the  COVID-19 declaration under Section 564(b)(1) of the Act, 21 U.S.C.  section 360bbb-3(b)(1), unless the authorization is terminated or  revoked. Performed at [facility], [address] [place], [place] [address]   Urine culture     Status: Abnormal   Collection Time: 06/16/20  7:46 AM   Specimen: Urine, Clean Catch  Result Value Ref Range Status   Specimen Description   Final    URINE, CLEAN CATCH Performed at [facility], [address] [place], [place] [address]    Special Requests   Final    NONE Performed at [facility], [address] [place], [place] [address]    Culture >=100,000 COLONIES/mL PSEUDOMONAS AERUGINOSA (A)  Final   Report Status 06/18/2020 FINAL  Final   Organism ID, Bacteria PSEUDOMONAS AERUGINOSA (A)  Final      Susceptibility   Pseudomonas aeruginosa - MIC*    CEFTAZIDIME 16 INTERMEDIATE Intermediate      CIPROFLOXACIN 0.5 SENSITIVE Sensitive     GENTAMICIN <=1 SENSITIVE Sensitive     IMIPENEM 2 SENSITIVE Sensitive     CEFEPIME  RESISTANT Resistant     * >=100,000 COLONIES/mL PSEUDOMONAS AERUGINOSA  Culture, blood (routine x 2)     Status: None (Preliminary result)   Collection Time: 06/16/20  8:23 AM   Specimen: BLOOD  Result Value Ref Range Status   Specimen Description BLOOD RIGHT ANTECUBITAL  Final   Special Requests   Final    BOTTLES DRAWN AEROBIC AND ANAEROBIC Blood Culture adequate volume   Culture   Final    NO GROWTH 2 DAYS Performed at [facility], [address], [place], [place] [address]    Report Status PENDING  Incomplete  Culture, blood (routine x 2)     Status: None (Preliminary result)   Collection Time: 06/16/20  8:23 AM   Specimen: BLOOD RIGHT HAND  Result Value Ref Range Status   Specimen Description BLOOD RIGHT HAND DRAWN BY RN  Final   Special Requests   Final    BOTTLES DRAWN AEROBIC AND ANAEROBIC Blood Culture adequate volume   Culture   Final    NO GROWTH 2 DAYS Performed at [facility], [address] [place], [place] [address]    Report Status PENDING  Incomplete    Radiology Reports CT ABDOMEN PELVIS W CONTRAST  Result Date: 06/16/2020 CLINICAL DATA:  79 year old male with history of abdominal pain and fever. Cough and back pain with body aches for the past 2 days. EXAM: CT ABDOMEN AND PELVIS WITH CONTRAST TECHNIQUE: Multidetector CT imaging of the abdomen and pelvis was performed using the standard protocol following bolus administration of intravenous contrast. CONTRAST:  160mL OMNIPAQUE IOHEXOL 300 MG/ML  SOLN COMPARISON:  CT the abdomen and pelvis 03/05/2020. FINDINGS: Lower chest: Trace right pleural effusion lying dependently. Aortic atherosclerosis. Calcifications of the aortic valve. Hepatobiliary: Diffuse low attenuation throughout the hepatic parenchyma, indicative of hepatic steatosis. Multiple small low-attenuation lesions scattered  throughout the hepatic parenchyma, compatible with simple cysts. No aggressive appearing hepatic lesions. No intra or extrahepatic biliary ductal dilatation. Gallbladder is normal in appearance. Pancreas: No pancreatic mass. No pancreatic ductal dilatation. No pancreatic or peripancreatic fluid collections or inflammatory changes. Spleen: Unremarkable. Adrenals/Urinary Tract: Subcentimeter low-attenuation lesion in the upper pole of the right kidney, too small to characterize, but stable compared to the prior study and statistically likely to represent a cyst. No suspicious lesions in either kidney. Bilateral adrenal glands are normal in appearance. Chronic mild to moderate left hydroureteronephrosis increased slightly compared to the prior study. No right hydroureteronephrosis. Status post radical cystoprostatectomy with right lower quadrant urostomy. Stomach/Bowel: Normal appearance of the stomach. Small periampullary duodenal diverticulum, without surrounding inflammatory changes to suggest an associated diverticulitis at this time. No pathologic dilatation of small bowel or colon. The appendix is not confidently identified and may be surgically absent. Regardless, there are no inflammatory changes noted adjacent to the cecum to suggest the presence of an acute appendicitis at this time. Vascular/Lymphatic: Aortic atherosclerosis, without evidence of aneurysm or dissection in the abdominal or pelvic vasculature. No lymphadenopathy noted in the abdomen or pelvis. Reproductive: Status post radical prostatectomy. Other: No significant volume of ascites.  No pneumoperitoneum. Musculoskeletal: Expansile lytic lesion in the right inferior pubic ramus measuring 6.0 x 3.0 cm, similar to the prior examination. IMPRESSION: 1. 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 2. No other definite sites of metastatic disease identified in the  abdomen or pelvis on today's examination. 3. Status post radical cystoprostatectomy with right lower quadrant urostomy.  Worsening chronic mild-to-moderate left hydroureteronephrosis compared to the prior study. 4. Trace right pleural effusion new compared to the prior examination. 5. Hepatic steatosis. 6. Aortic atherosclerosis. 7. There are calcifications of the aortic valve. Echocardiographic correlation for evaluation of potential valvular dysfunction may be warranted if clinically indicated. 8. Additional incidental findings, as above. Electronically Signed   By: Vinnie Langton M.D.   On: 06/16/2020 10:51   DG Chest Portable 1 View  Result Date: 06/16/2020 CLINICAL DATA:  Cough, fever. EXAM: PORTABLE CHEST 1 VIEW COMPARISON:  February 17, 2020. FINDINGS: The heart size and mediastinal contours are within normal limits. Both lungs are clear. The visualized skeletal structures are unremarkable. IMPRESSION: No active disease. Electronically Signed   By: Marijo Conception M.D.   On: 06/16/2020 08:26     CBC Recent Labs  Lab 06/16/20 0745 06/17/20 0618 06/18/20 0349  WBC 12.7* 12.8* 7.0  HGB 13.7 11.8* 11.4*  HCT 42.2 36.2* 35.9*  PLT 166 146* 140*  MCV 110.2* 109.7* 111.5*  MCH 35.8* 35.8* 35.4*  MCHC 32.5 32.6 31.8  RDW 12.8 13.0 12.8  LYMPHSABS 0.2* 0.3* 0.3*  MONOABS 1.7* 2.1* 1.2*  EOSABS 0.0 0.4 0.0  BASOSABS 0.0 0.0 0.0    Chemistries  Recent Labs  Lab 06/16/20 0745 06/17/20 0618 06/18/20 0349  NA 134* 135 138  K 3.8 3.4* 4.0  CL 103 103 108  CO2 22 25 25   GLUCOSE 153* 117* 94  BUN 12 12 12   CREATININE 1.16 1.06 0.92  CALCIUM 8.6* 8.0* 8.0*  MG  --  2.0 2.0  AST 16 14* 12*  ALT 12 11 12   ALKPHOS 56 40 40  BILITOT 0.9 0.7 0.6   ------------------------------------------------------------------------------------------------------------------ No results for input(s): CHOL, HDL, LDLCALC, TRIG, CHOLHDL, LDLDIRECT in the last 72 hours.  Lab Results  Component Value Date     HGBA1C 5.1 06/16/2020   ------------------------------------------------------------------------------------------------------------------ No results for input(s): TSH, T4TOTAL, T3FREE, THYROIDAB in the last 72 hours.  Invalid input(s): FREET3 ------------------------------------------------------------------------------------------------------------------ No results for input(s): VITAMINB12, FOLATE, FERRITIN, TIBC, IRON, RETICCTPCT in the last 72 hours.  Coagulation profile Recent Labs  Lab 06/16/20 0745  INR 1.1    No results for input(s): DDIMER in the last 72 hours.  Cardiac Enzymes No results for input(s): CKMB, TROPONINI, MYOGLOBIN in the last 168 hours.  Invalid input(s): CK ------------------------------------------------------------------------------------------------------------------    Component Value Date/Time   BNP 43.0 01/25/2018 1654     Vaniyah Lansky M.D on 06/18/2020 at 4:35 PM  Go to [email or web address] - for contact info  [facility] - Office  [phone]

## 2020-06-19 DIAGNOSIS — N390 Urinary tract infection, site not specified: Secondary | ICD-10-CM

## 2020-06-19 DIAGNOSIS — K219 Gastro-esophageal reflux disease without esophagitis: Secondary | ICD-10-CM

## 2020-06-19 DIAGNOSIS — A419 Sepsis, unspecified organism: Secondary | ICD-10-CM

## 2020-06-19 LAB — CBC WITH DIFFERENTIAL/PLATELET
Abs Immature Granulocytes: 0.02 10*3/uL (ref 0.00–0.07)
Basophils Absolute: 0 10*3/uL (ref 0.0–0.1)
Basophils Relative: 0 %
Eosinophils Absolute: 0.1 10*3/uL (ref 0.0–0.5)
Eosinophils Relative: 2 %
HCT: 36.3 % — ABNORMAL LOW (ref 39.0–52.0)
Hemoglobin: 11.7 g/dL — ABNORMAL LOW (ref 13.0–17.0)
Immature Granulocytes: 0 %
Lymphocytes Relative: 5 %
Lymphs Abs: 0.3 10*3/uL — ABNORMAL LOW (ref 0.7–4.0)
MCH: 36 pg — ABNORMAL HIGH (ref 26.0–34.0)
MCHC: 32.2 g/dL (ref 30.0–36.0)
MCV: 111.7 fL — ABNORMAL HIGH (ref 80.0–100.0)
Monocytes Absolute: 0.8 10*3/uL (ref 0.1–1.0)
Monocytes Relative: 16 %
Neutro Abs: 4.1 10*3/uL (ref 1.7–7.7)
Neutrophils Relative %: 77 %
Platelets: 163 10*3/uL (ref 150–400)
RBC: 3.25 MIL/uL — ABNORMAL LOW (ref 4.22–5.81)
RDW: 12.7 % (ref 11.5–15.5)
WBC: 5.4 10*3/uL (ref 4.0–10.5)
nRBC: 0 % (ref 0.0–0.2)

## 2020-06-19 LAB — COMPREHENSIVE METABOLIC PANEL
ALT: 16 U/L (ref 0–44)
AST: 21 U/L (ref 15–41)
Albumin: 2.3 g/dL — ABNORMAL LOW (ref 3.5–5.0)
Alkaline Phosphatase: 36 U/L — ABNORMAL LOW (ref 38–126)
Anion gap: 6 (ref 5–15)
BUN: 9 mg/dL (ref 8–23)
CO2: 24 mmol/L (ref 22–32)
Calcium: 8.1 mg/dL — ABNORMAL LOW (ref 8.9–10.3)
Chloride: 110 mmol/L (ref 98–111)
Creatinine, Ser: 0.85 mg/dL (ref 0.61–1.24)
GFR, Estimated: >60 mL/min (ref >60)
Glucose, Bld: 99 mg/dL (ref 70–99)
Potassium: 3.7 mmol/L (ref 3.5–5.1)
Sodium: 140 mmol/L (ref 135–145)
Total Bilirubin: 0.6 mg/dL (ref 0.3–1.2)
Total Protein: 4.7 g/dL — ABNORMAL LOW (ref 6.5–8.1)

## 2020-06-19 LAB — MAGNESIUM: Magnesium: 2.1 mg/dL (ref 1.7–2.4)

## 2020-06-19 MED ORDER — FOSFOMYCIN TROMETHAMINE 3 G PO PACK
3.0000 g | PACK | Freq: Once | ORAL | Status: AC
  Administered 2020-06-19: 3 g via ORAL
  Filled 2020-06-19: qty 3

## 2020-06-19 MED ORDER — ACETAMINOPHEN 325 MG PO TABS: 650.0000 mg | ORAL_TABLET | Freq: Four times a day (QID) | ORAL | 0 refills | Status: DC | PRN

## 2020-06-19 MED ORDER — FOSFOMYCIN TROMETHAMINE 3 G PO PACK: 3.0000 g | PACK | Freq: Once | ORAL | 0 refills | Status: AC

## 2020-06-19 NOTE — Care Management Important Message (Signed)
Important Message  Patient Details  Name: [name] MRN: [id number] Date of Birth: [birth year]   Medicare Important Message Given:  Yes     [name] 06/19/2020, 12:06 PM

## 2020-06-19 NOTE — Discharge Summary (Signed)
[name], is a 79 y.o. male  DOB [birth year]  MRN [id number].  Admission date:  06/16/2020  Admitting Physician  Murlean Iba, MD  Discharge Date:  06/19/2020   Primary MD  [name], MD  Recommendations for primary care physician for things to follow:   1)Take 1 dose of fosfomycin antibiotic on Sunday 06/22/2020 2)Please follow-up with a primary care physician within a week for repeat urine culture  Admission Diagnosis  Sepsis ([place]) [A41.9] Sepsis, due to unspecified organism, unspecified whether acute organ dysfunction present [facility]) [A41.9]   Discharge Diagnosis  Sepsis ([place]) [A41.9] Sepsis, due to unspecified organism, unspecified whether acute organ dysfunction present ([place]) [A41.9]    Active Problems:   Pseudomonas aeruginosa infection   Malignant neoplasm of urinary bladder ([place])   Status post ileal conduit ([place])   Sepsis secondary to UTI ([place])   Hyperglycemia   UTI (urinary tract infection)   Status post chemotherapy   GERD (gastroesophageal reflux disease)   Sinus tachycardia   Fever   Dehydration   Rigors   Severe sepsis ([facility])   Elevated lactic acid level      Past Medical History:  Diagnosis Date  . Bladder cancer ([place])   . BPH (benign prostatic hypertrophy)   . Dysuria   . GERD (gastroesophageal reflux disease)   . History of adenomatous polyp of colon    tubular adenoma 06/ 2018  . History of bladder cancer 12/2014   urologist-  dr [name]--  dx High Grade TCC without stromal invasion s/p TURBT and intravesical BCG tx/  recurrent 07/2015 (pTa) TCC  repear intravesical BCG tx   . History of hiatal hernia   . History of urethral stricture   . Nocturia     Past Surgical History:  Procedure Laterality Date  . CARDIAC CATHETERIZATION  1997   normal coronary arteries  . CARDIOVASCULAR STRESS TEST  12-14-2005   Low risk perfusion study/  very small scar in  the inferior septum from mid ventricle to apex,  no ischemia/  mild inferior septal hypokinesis/  ef 58%  . CATARACT EXTRACTION W/ INTRAOCULAR LENS  IMPLANT, BILATERAL  2011  . COLONOSCOPY  last one 06/ 2018  . CYSTOSCOPY WITH BIOPSY N/A 08/12/2015   Procedure: CYSTOSCOPY WITH BIOPSY AND FULGERATION;  Surgeon: [name], MD;  [facility];  Service: Urology;  Laterality: N/A;  . CYSTOSCOPY WITH INJECTION N/A 06/29/2017   Procedure: CYSTOSCOPY WITH INJECTION OF INDOCYANINE GREEN DYE;  Surgeon: Alexis Frock, MD;  Location: [facility];  Service: Urology;  Laterality: N/A;  . CYSTOSCOPY WITH URETHRAL DILATATION N/A 03/21/2017   Procedure: CYSTOSCOPY;  Surgeon: [name], MD;  Location: [facility];  Service: Urology;  Laterality: N/A;  . ESOPHAGOGASTRODUODENOSCOPY  12-05-2014  . IR IMAGING GUIDED PORT INSERTION  02/07/2018  . IR REMOVAL TUN ACCESS W/ PORT W/O FL MOD SED  04/14/2018  . TRANSTHORACIC ECHOCARDIOGRAM  01-31-2008   nomral LVF,  ef 55-65%/  mild pulmonic stenosis without  regurg.,  peak transpulomonic valve gradient 18mmHg  . TRANSURETHRAL RESECTION OF BLADDER TUMOR N/A 12/31/2014   Procedure: TRANSURETHRAL RESECTION OF BLADDER TUMOR (TURBT);  Surgeon: Lowella Bandy, MD;  Location[facility];  Service: Urology;  Laterality: N/A;  . TRANSURETHRAL RESECTION OF BLADDER TUMOR N/A 03/21/2017   Procedure: POSSIBLE TRANSURETHRAL RESECTION OF BLADDER TUMOR (TURBT);  Surgeon: [name], MD;  Location: [facility];  Service: Urology;  Laterality: N/A;     HPI  from the history and physical done on the day of admission:    Chief Complaint: cough, body aches  HPI: [name] is a 79 y.o. male with medical history significant for bladder cancer just recently completed radiation, chronic urostomy in place, HTN, BPH, GERD presented to ED complaining of cough and chest congestion, fever and body aches with  shaking chills rigors that have gotten a little more little worse over the past 24 hours.  He has chronic back pain that has not changed.  Is having some foul smell from smell from the urostomy bag.  His urine appeared more cloudy recently.  He had a temperature of 101 yesterday.  He denies nausea vomiting and diarrhea.  He denies chest pain or shortness of breath.  ED Course: Patient had a temperature of 100.8, pulse 114, blood pressure 170/95, respirations 25, pulse ox 96% on room air.  His sodium was 134, glucose 153, Calcium 8.6, albumin 3.1, lactic acid 2.9.  WBC 12.7, hemoglobin 13.7, hematocrit 42.2, platelet count 166.  Patient was bolused IV fluids and repeat lactic acid was 1.7.  SARS 2 was 2 coronavirus test negative influenza AMB test negative RSV test negative.  Urinalysis positive for large leukocytes small amount of hemoglobin greater than 50 WBC per high-power field.  Portable chest x-ray no active disease seen.  Abdomen pelvis no acute findings.  Patient was given IV bolus fluids and started on broad-spectrum antibiotics and admission was requested for for admission was requested for further management.  Review of Systems: As per HPI otherwise 10 point review of systems negative.     Hospital Course:    Brief Summary:- 79 y.o.malewith medical history significant for bladder cancer just recently completed radiation, chronic urostomy in place, HTN, BPH, GERD presented to ED complaining of cough and chest congestion, fever and body aches with shaking chills rigors admitted on 06/16/2020 and found to have severe sepsis secondary to Pseudomonas UTI  A/p 1)Severe sepsis-secondary to complicated Pseudomonas urinary tract infection--  In the patient with urostomy bag -Hemodynamics improved with IV fluid resuscitation -Urine culture with Pseudomonas is resistant to cephalosporins --Treated with iv meropenem starting 06/18/2020 -Treated with aztreonam from 06/16/20 thru 06/18/2020 Blood  cx--- NGTD -Patient received 1 dose of oral fosfomycin prior to discharge on 06/19/2020 -Plan is for another Fosfomycin dose at home on 06/22/20 -No further fevers Sepsis Pathophysiology has Resolved  2)History of bladder cancer-patient reports that he has recently completed radiation treatments will follow up with his oncologist.     3)Urostomy in place-c/n Routine urostomy care orders.  Code Status: Full Family Communication:   wife and  his oncologist Dr Alen Blew aware of plan of care Consults called: N/A   Disposition-- dc home with Wife   Discharge Condition: stable  Follow UP   Follow-up Information    [name], MD. Schedule an appointment as soon as possible for a visit in 1 week(s).   Specialty: Internal Medicine Why: Repeat urine culture advised within a week Contact  information: [address] [place] [address] [phone]               Diet and Activity recommendation:  As advised  Discharge Instructions    Discharge Instructions    Call MD for:  difficulty breathing, headache or visual disturbances   Complete by: As directed    Call MD for:  persistant dizziness or light-headedness   Complete by: As directed    Call MD for:  persistant nausea and vomiting   Complete by: As directed    Call MD for:  severe uncontrolled pain   Complete by: As directed    Call MD for:  temperature >100.4   Complete by: As directed    Diet - low sodium heart healthy   Complete by: As directed    Discharge instructions   Complete by: As directed    1)Take 1 dose of fosfomycin antibiotic on Sunday 06/22/2020 2)Please follow-up with a primary care physician within a week for repeat urine culture   Increase activity slowly   Complete by: As directed        Discharge Medications     Allergies as of 06/19/2020      Reactions   Ciprofloxacin Hives   Cefepime Rash      Medication List    STOP taking these medications   gabapentin 300 MG  capsule Commonly known as: NEURONTIN   sulfamethoxazole-trimethoprim 800-160 MG tablet Commonly known as: BACTRIM DS     TAKE these medications   acetaminophen 325 MG tablet Commonly known as: TYLENOL Take 2 tablets (650 mg total) by mouth every 6 (six) hours as needed for mild pain, fever or headache (or Fever >/= 101).   fosfomycin 3 g Pack Commonly known as: MONUROL Take 3 g by mouth once for 1 dose. Take 1 dose of fosfomycin antibiotic on Sunday 06/22/2020 Start taking on: June 22, 2020   HYDROmorphone 4 MG tablet Commonly known as: DILAUDID Take 1 tablet (4 mg total) by mouth every 4 (four) hours as needed for severe pain.   lactose free nutrition Liqd Take 237 mLs by mouth 3 (three) times daily between meals.   oxyCODONE 5 MG immediate release tablet Commonly known as: Oxy IR/ROXICODONE Take 1-2 tablets (5-10 mg total) by mouth every 4 (four) hours as needed for severe pain.      Major procedures and Radiology Reports - PLEASE review detailed and final reports for all details, in brief -   CT ABDOMEN PELVIS W CONTRAST  Result Date: 06/16/2020 CLINICAL DATA:  78 year old male with history of abdominal pain and fever. Cough and back pain with body aches for the past 2 days. EXAM: CT ABDOMEN AND PELVIS WITH CONTRAST TECHNIQUE: Multidetector CT imaging of the abdomen and pelvis was performed using the standard protocol following bolus administration of intravenous contrast. CONTRAST:  157mL OMNIPAQUE IOHEXOL 300 MG/ML  SOLN COMPARISON:  CT the abdomen and pelvis 03/05/2020. FINDINGS: Lower chest: Trace right pleural effusion lying dependently. Aortic atherosclerosis. Calcifications of the aortic valve. Hepatobiliary: Diffuse low attenuation throughout the hepatic parenchyma, indicative of hepatic steatosis. Multiple small low-attenuation lesions scattered throughout the hepatic parenchyma, compatible with simple cysts. No aggressive appearing hepatic lesions. No intra or  extrahepatic biliary ductal dilatation. Gallbladder is normal in appearance. Pancreas: No pancreatic mass. No pancreatic ductal dilatation. No pancreatic or peripancreatic fluid collections or inflammatory changes. Spleen: Unremarkable. Adrenals/Urinary Tract: Subcentimeter low-attenuation lesion in the upper pole of the right kidney, too small to characterize, but stable  compared to the prior study and statistically likely to represent a cyst. No suspicious lesions in either kidney. Bilateral adrenal glands are normal in appearance. Chronic mild to moderate left hydroureteronephrosis increased slightly compared to the prior study. No right hydroureteronephrosis. Status post radical cystoprostatectomy with right lower quadrant urostomy. Stomach/Bowel: Normal appearance of the stomach. Small periampullary duodenal diverticulum, without surrounding inflammatory changes to suggest an associated diverticulitis at this time. No pathologic dilatation of small bowel or colon. The appendix is not confidently identified and may be surgically absent. Regardless, there are no inflammatory changes noted adjacent to the cecum to suggest the presence of an acute appendicitis at this time. Vascular/Lymphatic: Aortic atherosclerosis, without evidence of aneurysm or dissection in the abdominal or pelvic vasculature. No lymphadenopathy noted in the abdomen or pelvis. Reproductive: Status post radical prostatectomy. Other: No significant volume of ascites.  No pneumoperitoneum. Musculoskeletal: Expansile lytic lesion in the right inferior pubic ramus measuring 6.0 x 3.0 cm, similar to the prior examination. IMPRESSION: 1. 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 2. No other definite sites of metastatic disease identified in the abdomen or pelvis on today's examination. 3. Status post radical cystoprostatectomy with right lower quadrant  urostomy. Worsening chronic mild-to-moderate left hydroureteronephrosis compared to the prior study. 4. Trace right pleural effusion new compared to the prior examination. 5. Hepatic steatosis. 6. Aortic atherosclerosis. 7. There are calcifications of the aortic valve. Echocardiographic correlation for evaluation of potential valvular dysfunction may be warranted if clinically indicated. 8. Additional incidental findings, as above. Electronically Signed   By: Vinnie Langton M.D.   On: 06/16/2020 10:51   DG Chest Portable 1 View  Result Date: 06/16/2020 CLINICAL DATA:  Cough, fever. EXAM: PORTABLE CHEST 1 VIEW COMPARISON:  February 17, 2020. FINDINGS: The heart size and mediastinal contours are within normal limits. Both lungs are clear. The visualized skeletal structures are unremarkable. IMPRESSION: No active disease. Electronically Signed   By: Marijo Conception M.D.   On: 06/16/2020 08:26   Micro Results   Recent Results (from the past 240 hour(s))  Resp Panel by RT PCR (RSV, Flu A&B, Covid) - Nasopharyngeal Swab     Status: None   Collection Time: 06/16/20  7:29 AM   Specimen: Nasopharyngeal Swab  Result Value Ref Range Status   SARS Coronavirus 2 by RT PCR NEGATIVE NEGATIVE Final    Comment: (NOTE) SARS-CoV-2 target nucleic acids are NOT DETECTED.  The SARS-CoV-2 RNA is generally detectable in upper respiratoy specimens during the acute phase of infection. The lowest concentration of SARS-CoV-2 viral copies this assay can detect is 131 copies/mL. A negative result does not preclude SARS-Cov-2 infection and should not be used as the sole basis for treatment or other patient management decisions. A negative result may occur with  improper specimen collection/handling, submission of specimen other than nasopharyngeal swab, presence of viral mutation(s) within the areas targeted by this assay, and inadequate number of viral copies (<131 copies/mL). A negative result must be combined with  clinical observations, patient history, and epidemiological information. The expected result is Negative.  Fact Sheet for Patients:  PinkCheek.be  Fact Sheet for Healthcare Providers:  GravelBags.it  This test is no t yet approved or cleared by the [place] FDA and  has been authorized for detection and/or diagnosis of SARS-CoV-2 by FDA under an Emergency Use Authorization (EUA). This EUA will remain  in effect (meaning this test can  be used) for the duration of the COVID-19 declaration under Section 564(b)(1) of the Act, 21 U.S.C. section 360bbb-3(b)(1), unless the authorization is terminated or revoked sooner.     Influenza A by PCR NEGATIVE NEGATIVE Final   Influenza B by PCR NEGATIVE NEGATIVE Final    Comment: (NOTE) The Xpert Xpress SARS-CoV-2/FLU/RSV assay is intended as an aid in  the diagnosis of influenza from Nasopharyngeal swab specimens and  should not be used as a sole basis for treatment. Nasal washings and  aspirates are unacceptable for Xpert Xpress SARS-CoV-2/FLU/RSV  testing.  Fact Sheet for Patients: PinkCheek.be  Fact Sheet for Healthcare Providers: GravelBags.it  This test is not yet approved or cleared by the [place] FDA and  has been authorized for detection and/or diagnosis of SARS-CoV-2 by  FDA under an Emergency Use Authorization (EUA). This EUA will remain  in effect (meaning this test can be used) for the duration of the  Covid-19 declaration under Section 564(b)(1) of the Act, 21  U.S.C. section 360bbb-3(b)(1), unless the authorization is  terminated or revoked.    Respiratory Syncytial Virus by PCR NEGATIVE NEGATIVE Final    Comment: (NOTE) Fact Sheet for Patients: PinkCheek.be  Fact Sheet for Healthcare Providers: GravelBags.it  This test is not yet approved  or cleared by the [place] FDA and  has been authorized for detection and/or diagnosis of SARS-CoV-2 by  FDA under an Emergency Use Authorization (EUA). This EUA will remain  in effect (meaning this test can be used) for the duration of the  COVID-19 declaration under Section 564(b)(1) of the Act, 21 U.S.C.  section 360bbb-3(b)(1), unless the authorization is terminated or  revoked. Performed at [facility], [address] [place], [place] [address]   Urine culture     Status: Abnormal   Collection Time: 06/16/20  7:46 AM   Specimen: Urine, Clean Catch  Result Value Ref Range Status   Specimen Description   Final    URINE, CLEAN CATCH Performed at [facility], [address], [place], [place] [address]    Special Requests   Final    NONE Performed at [facility], [address] [place], [place] [address]    Culture >=100,000 COLONIES/mL PSEUDOMONAS AERUGINOSA (A)  Final   Report Status 06/18/2020 FINAL  Final   Organism ID, Bacteria PSEUDOMONAS AERUGINOSA (A)  Final      Susceptibility   Pseudomonas aeruginosa - MIC*    CEFTAZIDIME 16 INTERMEDIATE Intermediate     CIPROFLOXACIN 0.5 SENSITIVE Sensitive     GENTAMICIN <=1 SENSITIVE Sensitive     IMIPENEM 2 SENSITIVE Sensitive     CEFEPIME RESISTANT Resistant     * >=100,000 COLONIES/mL PSEUDOMONAS AERUGINOSA  Culture, blood (routine x 2)     Status: None (Preliminary result)   Collection Time: 06/16/20  8:23 AM   Specimen: BLOOD  Result Value Ref Range Status   Specimen Description BLOOD RIGHT ANTECUBITAL  Final   Special Requests   Final    BOTTLES DRAWN AEROBIC AND ANAEROBIC Blood Culture adequate volume   Culture   Final    NO GROWTH 2 DAYS Performed at [facility], [address], [place], [place] [address]    Report Status PENDING  Incomplete  Culture, blood (routine x 2)     Status: None (Preliminary result)   Collection Time: 06/16/20  8:23 AM   Specimen: BLOOD RIGHT HAND  Result Value Ref Range Status    Specimen Description BLOOD RIGHT HAND DRAWN BY RN  Final   Special Requests   Final    BOTTLES DRAWN AEROBIC AND ANAEROBIC Blood Culture adequate volume   Culture   Final    NO GROWTH 2 DAYS Performed at [facility], [address] [place], [place] [address]    Report Status PENDING  Incomplete   Today   Subjective    [name] today has no new complaints, wife at bedside No fever  Or chills   No Nausea, Vomiting or Diarrhea        Patient has been seen and examined prior to discharge   Objective   Blood pressure (!) 113/99, pulse 90, temperature 98.3 F (36.8 C), temperature source Oral, resp. rate 18, height 6' (1.829 m), weight 94 kg, SpO2 98 %.   Intake/Output Summary (Last 24 hours) at 06/19/2020 1130 Last data filed at 06/19/2020 1006 Gross per 24 hour  Intake 1490 ml  Output 1950 ml  Net -460 ml   Exam Gen:- Awake Alert, no acute distress HEENT:- [place].AT, No sclera icterus Neck-Supple Neck,No JVD,.  Lungs-  CTAB , fair symmetrical air movement CV- S1, S2 normal, regular  Abd-  +ve B.Sounds, Abd Soft, No tenderness, urostomy bag    Extremity/Skin:- No  edema, pedal pulses present  Psych-affect is appropriate, oriented x3 Neuro-no new focal deficits, no tremors   Data Review   CBC w Diff:  Lab Results  Component Value Date   WBC 5.4 06/19/2020   HGB 11.7 (L) 06/19/2020   HGB 14.8 03/05/2020   HGB 12.7 (L) 05/23/2017   HCT 36.3 (L) 06/19/2020   HCT 37.3 (L) 05/23/2017   PLT 163 06/19/2020   PLT 208 03/05/2020   PLT 298 05/23/2017   LYMPHOPCT 5 06/19/2020   LYMPHOPCT 12.4 (L) 05/23/2017   MONOPCT 16 06/19/2020   MONOPCT 16.8 (H) 05/23/2017   EOSPCT 2 06/19/2020   EOSPCT 1.8 05/23/2017   BASOPCT 0 06/19/2020   BASOPCT 0.8 05/23/2017    CMP:  Lab Results  Component Value Date   NA 140 06/19/2020   NA 140 04/30/2019   NA 141 05/23/2017   K 3.7 06/19/2020   K 4.0 05/23/2017   CL 110 06/19/2020   CO2 24 06/19/2020   CO2 27 05/23/2017   BUN  9 06/19/2020   BUN 16 04/30/2019   BUN 12.5 05/23/2017   CREATININE 0.85 06/19/2020   CREATININE 1.01 03/05/2020   CREATININE 0.8 05/23/2017   PROT 4.7 (L) 06/19/2020   PROT 5.7 (L) 04/30/2019   PROT 5.6 (L) 05/23/2017   ALBUMIN 2.3 (L) 06/19/2020   ALBUMIN 3.7 04/30/2019   ALBUMIN 2.5 (L) 05/23/2017   BILITOT 0.6 06/19/2020   BILITOT 0.4 03/05/2020   BILITOT 0.37 05/23/2017   ALKPHOS 36 (L) 06/19/2020   ALKPHOS 57 05/23/2017   AST 21 06/19/2020   AST 13 (L) 03/05/2020   AST 13 05/23/2017   ALT 16 06/19/2020   ALT 17 03/05/2020   ALT 15 05/23/2017   Total Discharge time is about 33 minutes  Roxan Hockey M.D on 06/19/2020 at 11:30 AM  Go to [email or web address] -  for contact info  [facility] - Office  [phone]

## 2020-06-19 NOTE — Discharge Instructions (Signed)
1)Take 1 dose of fosfomycin antibiotic on Sunday 06/22/2020 2)Please follow-up with a primary care physician within a week for repeat urine culture

## 2020-06-22 LAB — CULTURE, BLOOD (ROUTINE X 2)
Culture: NO GROWTH
Culture: NO GROWTH
Special Requests: ADEQUATE
Special Requests: ADEQUATE

## 2020-06-23 DIAGNOSIS — N390 Urinary tract infection, site not specified: Secondary | ICD-10-CM | POA: Diagnosis not present

## 2020-06-23 DIAGNOSIS — Z6829 Body mass index (BMI) 29.0-29.9, adult: Secondary | ICD-10-CM | POA: Diagnosis not present

## 2020-06-23 DIAGNOSIS — Z0001 Encounter for general adult medical examination with abnormal findings: Secondary | ICD-10-CM | POA: Diagnosis not present

## 2020-06-23 DIAGNOSIS — Z1331 Encounter for screening for depression: Secondary | ICD-10-CM | POA: Diagnosis not present

## 2020-06-23 DIAGNOSIS — A419 Sepsis, unspecified organism: Secondary | ICD-10-CM | POA: Diagnosis not present

## 2020-06-23 DIAGNOSIS — I10 Essential (primary) hypertension: Secondary | ICD-10-CM | POA: Diagnosis not present

## 2020-06-23 DIAGNOSIS — R5383 Other fatigue: Secondary | ICD-10-CM | POA: Diagnosis not present

## 2020-06-24 DIAGNOSIS — Z23 Encounter for immunization: Secondary | ICD-10-CM | POA: Diagnosis not present

## 2020-06-26 DIAGNOSIS — L501 Idiopathic urticaria: Secondary | ICD-10-CM | POA: Diagnosis not present

## 2020-06-27 ENCOUNTER — Other Ambulatory Visit: Payer: Self-pay

## 2020-06-27 ENCOUNTER — Ambulatory Visit (INDEPENDENT_AMBULATORY_CARE_PROVIDER_SITE_OTHER): Payer: Medicare Other

## 2020-06-27 DIAGNOSIS — L501 Idiopathic urticaria: Secondary | ICD-10-CM

## 2020-07-04 ENCOUNTER — Ambulatory Visit (INDEPENDENT_AMBULATORY_CARE_PROVIDER_SITE_OTHER): Payer: Medicare Other

## 2020-07-04 ENCOUNTER — Other Ambulatory Visit: Payer: Self-pay

## 2020-07-04 DIAGNOSIS — Z23 Encounter for immunization: Secondary | ICD-10-CM | POA: Diagnosis not present

## 2020-07-04 NOTE — Progress Notes (Signed)
   Covid-19 Vaccination Clinic  Name:  COSTA JHA    MRN: [id number] DOB: [birth year]  07/04/2020  Mr. Batley was observed post Covid-19 immunization for 30 minutes based on pre-vaccination screening without incident. He was provided with Vaccine Information Sheet and instruction to access the V-Safe system.   Mr. Karner was instructed to call 911 with any severe reactions post vaccine: Marland Kitchen Difficulty breathing  . Swelling of face and throat  . A fast heartbeat  . A bad rash all over body  . Dizziness and weakness   Immunizations Administered    No immunizations on file.

## 2020-07-09 ENCOUNTER — Inpatient Hospital Stay (HOSPITAL_BASED_OUTPATIENT_CLINIC_OR_DEPARTMENT_OTHER): Payer: Medicare Other | Admitting: Oncology

## 2020-07-09 ENCOUNTER — Inpatient Hospital Stay: Payer: Medicare Other | Attending: Oncology

## 2020-07-09 ENCOUNTER — Other Ambulatory Visit: Payer: Self-pay

## 2020-07-09 VITALS — BP 159/85 | HR 100 | Temp 97.0°F | Resp 18 | Ht 72.0 in | Wt 208.0 lb

## 2020-07-09 DIAGNOSIS — C7951 Secondary malignant neoplasm of bone: Secondary | ICD-10-CM | POA: Diagnosis not present

## 2020-07-09 DIAGNOSIS — N390 Urinary tract infection, site not specified: Secondary | ICD-10-CM | POA: Diagnosis not present

## 2020-07-09 DIAGNOSIS — C7800 Secondary malignant neoplasm of unspecified lung: Secondary | ICD-10-CM | POA: Insufficient documentation

## 2020-07-09 DIAGNOSIS — C679 Malignant neoplasm of bladder, unspecified: Secondary | ICD-10-CM | POA: Insufficient documentation

## 2020-07-09 LAB — CBC WITH DIFFERENTIAL (CANCER CENTER ONLY)
Abs Immature Granulocytes: 0.03 10*3/uL (ref 0.00–0.07)
Basophils Absolute: 0.1 10*3/uL (ref 0.0–0.1)
Basophils Relative: 1 %
Eosinophils Absolute: 0.2 10*3/uL (ref 0.0–0.5)
Eosinophils Relative: 2 %
HCT: 43.8 % (ref 39.0–52.0)
Hemoglobin: 14.4 g/dL (ref 13.0–17.0)
Immature Granulocytes: 0 %
Lymphocytes Relative: 6 %
Lymphs Abs: 0.5 10*3/uL — ABNORMAL LOW (ref 0.7–4.0)
MCH: 35 pg — ABNORMAL HIGH (ref 26.0–34.0)
MCHC: 32.9 g/dL (ref 30.0–36.0)
MCV: 106.6 fL — ABNORMAL HIGH (ref 80.0–100.0)
Monocytes Absolute: 1 10*3/uL (ref 0.1–1.0)
Monocytes Relative: 14 %
Neutro Abs: 5.9 10*3/uL (ref 1.7–7.7)
Neutrophils Relative %: 77 %
Platelet Count: 209 10*3/uL (ref 150–400)
RBC: 4.11 MIL/uL — ABNORMAL LOW (ref 4.22–5.81)
RDW: 13.1 % (ref 11.5–15.5)
WBC Count: 7.6 10*3/uL (ref 4.0–10.5)
nRBC: 0 % (ref 0.0–0.2)

## 2020-07-09 LAB — CMP (CANCER CENTER ONLY)
ALT: 16 U/L (ref 0–44)
AST: 17 U/L (ref 15–41)
Albumin: 2.9 g/dL — ABNORMAL LOW (ref 3.5–5.0)
Alkaline Phosphatase: 69 U/L (ref 38–126)
Anion gap: 11 (ref 5–15)
BUN: 14 mg/dL (ref 8–23)
CO2: 22 mmol/L (ref 22–32)
Calcium: 9.3 mg/dL (ref 8.9–10.3)
Chloride: 108 mmol/L (ref 98–111)
Creatinine: 1.08 mg/dL (ref 0.61–1.24)
GFR, Estimated: >60 mL/min (ref >60)
Glucose, Bld: 111 mg/dL — ABNORMAL HIGH (ref 70–99)
Potassium: 3.8 mmol/L (ref 3.5–5.1)
Sodium: 141 mmol/L (ref 135–145)
Total Bilirubin: 0.4 mg/dL (ref 0.3–1.2)
Total Protein: 6.2 g/dL — ABNORMAL LOW (ref 6.5–8.1)

## 2020-07-09 MED ORDER — HYDROMORPHONE HCL 4 MG PO TABS: 4.0000 mg | ORAL_TABLET | ORAL | 0 refills | Status: DC | PRN

## 2020-07-09 MED ORDER — OXYCODONE HCL 5 MG PO TABS: 5.0000 mg | ORAL_TABLET | ORAL | 0 refills | Status: DC | PRN

## 2020-07-09 NOTE — Progress Notes (Signed)
Hematology and Oncology Follow Up   [name] [id number] 02-11-1941 79 y.o. 07/09/2020 2:47 PM [name], MDFusco, Purcell Nails, MD       Principle Diagnosis: 79 year old man with stage IV bladder cancer with the bone and pulmonary involvement documented in 2019.  His tumor PDL 1 positive  with CPS score over 90% of that time.  He initially presented with localized disease in 2018.   Prior Therapy:   He is status post neoadjuvant chemotherapy utilizing gemcitabine and cisplatin started in September 2018.  He received day 1 of cycle 1 on 05/02/2017 and therapy discontinued after that.  He is status post laparoscopic Cystoprostatectomy and bilateral lymphadenectomy done by Dr. Tresa Moore on 06/29/2017. Final pathology showed a T3bN0 disease.  He has 13 lymph nodes sampled without any evidence of malignancy.  He completed radiation therapy to the pelvis on January 16, 2018 under the care of Dr. Tammi Klippel.   Carboplatin and gemcitabine he is status post 6 cycles of therapy completed on 05/22/2018.  Pembrolizumab 200 mg every 3 weeks started on 06/21/2018.  He completed 8 cycles of therapy and April 2020.  Therapy discontinued because of dermatological toxicities.  He is status post radiation therapy to the right paratracheal lymph node for a total of 50 Gray in 10 fractions completed on April 23, 2020.   Current therapy:  Active surveillance.   Interim History: [name] returns today for a follow-up visit.  Since the last visit, he was hospitalized between November 1 and then November 4 for urinary sepsis related to a Pseudomonas urinary infection.  Since his discharge, he reports overall feeling well without any major complaints.  He denies any nausea, vomiting or abdominal pain.  Continues to have right-sided hip pain which has not dramatically changed.  He alternates between Percocet and Dilaudid daily which maintaining his pain under reasonable control.   Medications:  Unchanged on review. Current Outpatient Medications  Medication Sig Dispense Refill  . acetaminophen (TYLENOL) 325 MG tablet Take 2 tablets (650 mg total) by mouth every 6 (six) hours as needed for mild pain, fever or headache (or Fever >/= 101). 12 tablet 0  . HYDROmorphone (DILAUDID) 4 MG tablet Take 1 tablet (4 mg total) by mouth every 4 (four) hours as needed for severe pain. 120 tablet 0  . lactose free nutrition (BOOST) LIQD Take 237 mLs by mouth 3 (three) times daily between meals.     Marland Kitchen oxyCODONE (OXY IR/ROXICODONE) 5 MG immediate release tablet Take 1-2 tablets (5-10 mg total) by mouth every 4 (four) hours as needed for severe pain. 120 tablet 0   Current Facility-Administered Medications  Medication Dose Route Frequency Provider Last Rate Last Admin  . omalizumab Arvid Right) injection 300 mg  300 mg Subcutaneous Q14 Days Valentina Shaggy, MD   300 mg at 06/27/20 1352     Allergies:  Allergies  Allergen Reactions  . Ciprofloxacin Hives  . Cefepime Rash   Physical exam: Blood pressure (!) 159/85, pulse 100, temperature (!) 97 F (36.1 C), temperature source Tympanic, resp. rate 18, height 6' (1.829 m), weight 208 lb (94.3 kg), SpO2 97 %.    ECOG 1   General appearance: Comfortable appearing without any discomfort Head: Normocephalic without any trauma Oropharynx: Mucous membranes are moist and pink without any thrush or ulcers. Eyes: Pupils are equal and round reactive to light. Lymph nodes: No cervical, supraclavicular, inguinal or axillary lymphadenopathy.   Heart:regular rate and rhythm.  S1 and S2 without leg edema. Lung:  Clear without any rhonchi or wheezes.  No dullness to percussion. Abdomin: Soft, nontender, nondistended with good bowel sounds.  No hepatosplenomegaly. Musculoskeletal: No joint deformity or effusion.  Full range of motion noted. Neurological: No deficits noted on motor, sensory and deep tendon reflex exam. Skin: No petechial rash or dryness.   Appeared moist.       Lab Results: Lab Results  Component Value Date   WBC 7.6 07/09/2020   HGB 14.4 07/09/2020   HCT 43.8 07/09/2020   MCV 106.6 (H) 07/09/2020   PLT 209 07/09/2020     Chemistry      Component Value Date/Time   NA 140 06/19/2020 0426   NA 140 04/30/2019 1033   NA 141 05/23/2017 0828   K 3.7 06/19/2020 0426   K 4.0 05/23/2017 0828   CL 110 06/19/2020 0426   CO2 24 06/19/2020 0426   CO2 27 05/23/2017 0828   BUN 9 06/19/2020 0426   BUN 16 04/30/2019 1033   BUN 12.5 05/23/2017 0828   CREATININE 0.85 06/19/2020 0426   CREATININE 1.01 03/05/2020 1407   CREATININE 0.8 05/23/2017 0828      Component Value Date/Time   CALCIUM 8.1 (L) 06/19/2020 0426   CALCIUM 9.0 05/23/2017 0828   ALKPHOS 36 (L) 06/19/2020 0426   ALKPHOS 57 05/23/2017 0828   AST 21 06/19/2020 0426   AST 13 (L) 03/05/2020 1407   AST 13 05/23/2017 0828   ALT 16 06/19/2020 0426   ALT 17 03/05/2020 1407   ALT 15 05/23/2017 0828   BILITOT 0.6 06/19/2020 0426   BILITOT 0.4 03/05/2020 1407   BILITOT 0.37 05/23/2017 0828        Impression and Plan:  79 year old man with:   1.    Bladder cancer diagnosed in 2018.  He subsequently developed stage IV disease with pulmonary and bone involvement.  He is currently on active surveillance after receiving systemic therapy outlined above and radiation therapy to an enlarging pulmonary lymph node.  Risks and benefits of restarting systemic therapy were reviewed.  Pembrolizumab versus Padcev would be his option if he develop worsening metastatic disease.  I recommend repeat imaging studies in 3 months and determine course of treatment at this time.  He is agreeable with this plan.   2. Hip pain:  Related due to metastatic disease to the bone.  MRI obtained on the June 14, 2020 showed no progression of disease.  He has follow-up at [facility] regarding further procedure for his hip.  In the meantime, we will refill his  Dilaudid and Percocet.  3.  Goals of care: Therapy remains palliative although aggressive measures are warranted given his reasonable performance status.   4.    Recurrent UTI: Resolved at this time.  5.  Follow-up:  In 3 months for repeat evaluation.  30  minutes were spent on this encounter.  The time was dedicated to updating his disease status, reviewing imaging studies, discussing treatment options and future plan of care review.   Zola Button, MD 07/09/2020 2:47 PM

## 2020-07-13 ENCOUNTER — Inpatient Hospital Stay (HOSPITAL_COMMUNITY)
Admission: EM | Admit: 2020-07-13 | Discharge: 2020-07-18 | DRG: 872 | Disposition: A | Discharge status: Home / Self Care | Payer: Medicare Other | Attending: Family Medicine | Admitting: Family Medicine

## 2020-07-13 ENCOUNTER — Encounter (HOSPITAL_COMMUNITY): Payer: Self-pay

## 2020-07-13 ENCOUNTER — Other Ambulatory Visit: Payer: Self-pay

## 2020-07-13 DIAGNOSIS — Z20822 Contact with and (suspected) exposure to covid-19: Secondary | ICD-10-CM | POA: Diagnosis present

## 2020-07-13 DIAGNOSIS — Z961 Presence of intraocular lens: Secondary | ICD-10-CM | POA: Diagnosis present

## 2020-07-13 DIAGNOSIS — B965 Pseudomonas (aeruginosa) (mallei) (pseudomallei) as the cause of diseases classified elsewhere: Secondary | ICD-10-CM | POA: Diagnosis present

## 2020-07-13 DIAGNOSIS — Z86010 Personal history of colonic polyps: Secondary | ICD-10-CM | POA: Diagnosis not present

## 2020-07-13 DIAGNOSIS — Z9842 Cataract extraction status, left eye: Secondary | ICD-10-CM

## 2020-07-13 DIAGNOSIS — D7589 Other specified diseases of blood and blood-forming organs: Secondary | ICD-10-CM | POA: Diagnosis present

## 2020-07-13 DIAGNOSIS — Z1624 Resistance to multiple antibiotics: Secondary | ICD-10-CM | POA: Diagnosis present

## 2020-07-13 DIAGNOSIS — Z79899 Other long term (current) drug therapy: Secondary | ICD-10-CM

## 2020-07-13 DIAGNOSIS — C7800 Secondary malignant neoplasm of unspecified lung: Secondary | ICD-10-CM | POA: Diagnosis not present

## 2020-07-13 DIAGNOSIS — Z936 Other artificial openings of urinary tract status: Secondary | ICD-10-CM | POA: Diagnosis not present

## 2020-07-13 DIAGNOSIS — J449 Chronic obstructive pulmonary disease, unspecified: Secondary | ICD-10-CM | POA: Diagnosis present

## 2020-07-13 DIAGNOSIS — Z8551 Personal history of malignant neoplasm of bladder: Secondary | ICD-10-CM

## 2020-07-13 DIAGNOSIS — A419 Sepsis, unspecified organism: Principal | ICD-10-CM | POA: Diagnosis present

## 2020-07-13 DIAGNOSIS — C679 Malignant neoplasm of bladder, unspecified: Secondary | ICD-10-CM | POA: Diagnosis not present

## 2020-07-13 DIAGNOSIS — N400 Enlarged prostate without lower urinary tract symptoms: Secondary | ICD-10-CM | POA: Diagnosis present

## 2020-07-13 DIAGNOSIS — K219 Gastro-esophageal reflux disease without esophagitis: Secondary | ICD-10-CM | POA: Diagnosis present

## 2020-07-13 DIAGNOSIS — N390 Urinary tract infection, site not specified: Secondary | ICD-10-CM | POA: Diagnosis present

## 2020-07-13 DIAGNOSIS — Z888 Allergy status to other drugs, medicaments and biological substances status: Secondary | ICD-10-CM | POA: Diagnosis not present

## 2020-07-13 DIAGNOSIS — C7951 Secondary malignant neoplasm of bone: Secondary | ICD-10-CM | POA: Diagnosis not present

## 2020-07-13 DIAGNOSIS — Z9841 Cataract extraction status, right eye: Secondary | ICD-10-CM | POA: Diagnosis not present

## 2020-07-13 DIAGNOSIS — Z881 Allergy status to other antibiotic agents status: Secondary | ICD-10-CM

## 2020-07-13 LAB — CBC WITH DIFFERENTIAL/PLATELET
Abs Immature Granulocytes: 0.08 10*3/uL — ABNORMAL HIGH (ref 0.00–0.07)
Basophils Absolute: 0 10*3/uL (ref 0.0–0.1)
Basophils Relative: 0 %
Eosinophils Absolute: 0 10*3/uL (ref 0.0–0.5)
Eosinophils Relative: 0 %
HCT: 40.2 % (ref 39.0–52.0)
Hemoglobin: 12.9 g/dL — ABNORMAL LOW (ref 13.0–17.0)
Immature Granulocytes: 1 %
Lymphocytes Relative: 3 %
Lymphs Abs: 0.4 10*3/uL — ABNORMAL LOW (ref 0.7–4.0)
MCH: 35.4 pg — ABNORMAL HIGH (ref 26.0–34.0)
MCHC: 32.1 g/dL (ref 30.0–36.0)
MCV: 110.4 fL — ABNORMAL HIGH (ref 80.0–100.0)
Monocytes Absolute: 1.7 10*3/uL — ABNORMAL HIGH (ref 0.1–1.0)
Monocytes Relative: 12 %
Neutro Abs: 11.8 10*3/uL — ABNORMAL HIGH (ref 1.7–7.7)
Neutrophils Relative %: 84 %
Platelets: 180 10*3/uL (ref 150–400)
RBC: 3.64 MIL/uL — ABNORMAL LOW (ref 4.22–5.81)
RDW: 13.1 % (ref 11.5–15.5)
WBC: 14.1 10*3/uL — ABNORMAL HIGH (ref 4.0–10.5)
nRBC: 0 % (ref 0.0–0.2)

## 2020-07-13 LAB — URINALYSIS, ROUTINE W REFLEX MICROSCOPIC
Bilirubin Urine: NEGATIVE
Glucose, UA: NEGATIVE mg/dL
Ketones, ur: NEGATIVE mg/dL
Nitrite: POSITIVE — AB
Protein, ur: 30 mg/dL — AB
Specific Gravity, Urine: 1.01 (ref 1.005–1.030)
pH: 6 (ref 5.0–8.0)

## 2020-07-13 LAB — COMPREHENSIVE METABOLIC PANEL
ALT: 16 U/L (ref 0–44)
AST: 18 U/L (ref 15–41)
Albumin: 3.1 g/dL — ABNORMAL LOW (ref 3.5–5.0)
Alkaline Phosphatase: 47 U/L (ref 38–126)
Anion gap: 8 (ref 5–15)
BUN: 21 mg/dL (ref 8–23)
CO2: 24 mmol/L (ref 22–32)
Calcium: 8.5 mg/dL — ABNORMAL LOW (ref 8.9–10.3)
Chloride: 100 mmol/L (ref 98–111)
Creatinine, Ser: 1.1 mg/dL (ref 0.61–1.24)
GFR, Estimated: >60 mL/min (ref >60)
Glucose, Bld: 118 mg/dL — ABNORMAL HIGH (ref 70–99)
Potassium: 3.8 mmol/L (ref 3.5–5.1)
Sodium: 132 mmol/L — ABNORMAL LOW (ref 135–145)
Total Bilirubin: 1 mg/dL (ref 0.3–1.2)
Total Protein: 6 g/dL — ABNORMAL LOW (ref 6.5–8.1)

## 2020-07-13 LAB — RESP PANEL BY RT-PCR (FLU A&B, COVID) ARPGX2
Influenza A by PCR: NEGATIVE
Influenza B by PCR: NEGATIVE
SARS Coronavirus 2 by RT PCR: NEGATIVE

## 2020-07-13 LAB — LACTIC ACID, PLASMA: Lactic Acid, Venous: 1.1 mmol/L (ref 0.5–1.9)

## 2020-07-13 MED ORDER — HYDROMORPHONE HCL 4 MG PO TABS
4.0000 mg | ORAL_TABLET | ORAL | Status: DC | PRN
  Administered 2020-07-13 – 2020-07-18 (×11): 4 mg via ORAL
  Filled 2020-07-13 (×2): qty 1
  Filled 2020-07-13: qty 2
  Filled 2020-07-13 (×8): qty 1

## 2020-07-13 MED ORDER — SODIUM CHLORIDE 0.9 % IV SOLN
1.0000 g | Freq: Three times a day (TID) | INTRAVENOUS | Status: DC
  Administered 2020-07-13 – 2020-07-18 (×15): 1 g via INTRAVENOUS
  Filled 2020-07-13 (×15): qty 1

## 2020-07-13 MED ORDER — POLYETHYLENE GLYCOL 3350 17 G PO PACK: 17.0000 g | PACK | Freq: Every day | ORAL | Status: DC | PRN

## 2020-07-13 MED ORDER — ONDANSETRON HCL 4 MG PO TABS: 4.0000 mg | ORAL_TABLET | Freq: Four times a day (QID) | ORAL | Status: DC | PRN

## 2020-07-13 MED ORDER — SODIUM CHLORIDE 0.9 % IV SOLN
500.0000 mg | Freq: Once | INTRAVENOUS | Status: AC
  Administered 2020-07-13: 500 mg via INTRAVENOUS
  Filled 2020-07-13: qty 500

## 2020-07-13 MED ORDER — ACETAMINOPHEN 325 MG PO TABS: 650.0000 mg | ORAL_TABLET | Freq: Four times a day (QID) | ORAL | Status: DC | PRN

## 2020-07-13 MED ORDER — POTASSIUM CHLORIDE IN NACL 20-0.9 MEQ/L-% IV SOLN: INTRAVENOUS | Status: AC

## 2020-07-13 MED ORDER — ACETAMINOPHEN 650 MG RE SUPP: 650.0000 mg | Freq: Four times a day (QID) | RECTAL | Status: DC | PRN

## 2020-07-13 MED ORDER — ENOXAPARIN SODIUM 40 MG/0.4ML ~~LOC~~ SOLN
40.0000 mg | SUBCUTANEOUS | Status: DC
  Administered 2020-07-13 – 2020-07-17 (×5): 40 mg via SUBCUTANEOUS
  Filled 2020-07-13 (×5): qty 0.4

## 2020-07-13 MED ORDER — ENSURE ENLIVE PO LIQD: 237.0000 mL | Freq: Three times a day (TID) | ORAL | Status: DC

## 2020-07-13 MED ORDER — ONDANSETRON HCL 4 MG/2ML IJ SOLN: 4.0000 mg | Freq: Four times a day (QID) | INTRAMUSCULAR | Status: DC | PRN

## 2020-07-13 MED ORDER — SODIUM CHLORIDE 0.9 % IV BOLUS
500.0000 mL | Freq: Once | INTRAVENOUS | Status: AC
  Administered 2020-07-13: 500 mL via INTRAVENOUS

## 2020-07-13 NOTE — ED Notes (Signed)
TT, PA in to assess

## 2020-07-13 NOTE — Progress Notes (Addendum)
Pharmacy Antibiotic Note  [name] is a 79 y.o. male admitted on 07/13/2020 with UTI.  Pharmacy has been consulted for merrem dosing.  Plan: merrem 1gm iv q8h  Height: 6' (182.9 cm) Weight: 94.3 kg (208 lb) IBW/kg (Calculated) : 77.6  Temp (24hrs), Avg:97.8 F (36.6 C), Min:97.6 F (36.4 C), Max:98 F (36.7 C)  Recent Labs  Lab 07/09/20 1431 07/13/20 1339 07/13/20 1652  WBC 7.6 14.1*  --   CREATININE 1.08 1.10  --   LATICACIDVEN  --   --  1.1    Estimated Creatinine Clearance: 64.9 mL/min (by C-G formula based on SCr of 1.1 mg/dL).    Allergies  Allergen Reactions  . Ciprofloxacin Hives  . Cefepime Rash    Antimicrobials this admission: 11/28 merrem >>  11/28 primaxin x 1   Microbiology results: 11/28 BCx: sent  11/28 UCx: sent   11/28 Covid/Flu: negative   Thank you for allowing pharmacy to be a part of this patient's care.  Donna Christen Alaylah Heatherington 07/13/2020 7:19 PM

## 2020-07-13 NOTE — ED Provider Notes (Signed)
[facility] EMERGENCY DEPARTMENT Provider Note   CSN: [id number] Arrival date & time: 07/13/20  1147     History Chief Complaint  Patient presents with  . Urinary Tract Infection    [name] is a 79 y.o. male.  HPI      [name] is a 79 y.o. male with past medical history of BPH, bladder cancer who has a urostomy, he presents to the Emergency Department requesting evaluation for possible UTI.  He is recently underwent radiation therapy, but is not on active chemotherapy at this time.  He states that he has developed fever and chills with intermittent sweats.  Symptoms began yesterday.  T-max fever of 101.8 last evening.  Temp improved after taking Tylenol.  He states that his urine appears darker than normal and he feels some discomfort of his abdomen.  He was admitted here earlier this month for sepsis and found to have a urinary tract infection at that time.  Previous urine culture grew Pseudomonas aeruginosa.  He reports 2 episodes of vomiting yesterday without further vomiting today.  He denies chest pain, shortness of breath, back or flank pain.   Past Medical History:  Diagnosis Date  . Bladder cancer ([place])   . BPH (benign prostatic hypertrophy)   . Dysuria   . GERD (gastroesophageal reflux disease)   . History of adenomatous polyp of colon    tubular adenoma 06/ 2018  . History of bladder cancer 12/2014   urologist-  dr [name]--  dx High Grade TCC without stromal invasion s/p TURBT and intravesical BCG tx/  recurrent 07/2015 (pTa) TCC  repear intravesical BCG tx   . History of hiatal hernia   . History of urethral stricture   . Nocturia     Patient Active Problem List   Diagnosis Date Noted  . Complicated UTI (urinary tract infection) 07/13/2020  . Pseudomonas aeruginosa infection 06/18/2020  . GERD (gastroesophageal reflux disease)   . Sinus tachycardia   . Fever   . Dehydration   . Rigors   . Severe sepsis ([place])   . Elevated lactic acid level     . Cancer with pulmonary metastases ([place]) 03/31/2020  . Redness and swelling of thigh 06/05/2018  . Adverse drug reaction 06/05/2018  . Bacteremia associated with intravascular line ([place]) 04/20/2018  . Febrile neutropenia ([place]) 04/13/2018  . Gram positive sepsis ([place]) 04/13/2018  . SIRS (systemic inflammatory response syndrome) ([place]) 04/12/2018  . Bacteremia due to Gram-positive bacteria 04/12/2018  . Pancytopenia ([place]) 04/12/2018  . Status post chemotherapy   . UTI (urinary tract infection) 04/11/2018  . Port-A-Cath in place 02/20/2018  . Encounter for antineoplastic chemotherapy 02/17/2018  . Sepsis due to urinary tract infection ([place]) 01/25/2018  . Sepsis secondary to UTI ([place]) 01/25/2018  . Hyperglycemia   . Goals of care, counseling/discussion 01/18/2018  . Bone metastasis ([place]) 12/29/2017  . Status post ileal conduit ([place]) 06/29/2017  . Bladder cancer ([place]) 06/28/2017  . Malignant neoplasm of urinary bladder ([place]) 04/20/2017  . COPD UNSPECIFIED 03/07/2008    Past Surgical History:  Procedure Laterality Date  . CARDIAC CATHETERIZATION  1997   normal coronary arteries  . CARDIOVASCULAR STRESS TEST  12-14-2005   Low risk perfusion study/  very small scar in the inferior septum from mid ventricle to apex,  no ischemia/  mild inferior septal hypokinesis/  ef 58%  . CATARACT EXTRACTION W/ INTRAOCULAR LENS  IMPLANT, BILATERAL  2011  . COLONOSCOPY  last one 06/ 2018  .  CYSTOSCOPY WITH BIOPSY N/A 08/12/2015   Procedure: CYSTOSCOPY WITH BIOPSY AND FULGERATION;  Surgeon: [name], MD;  Location: [facility];  Service: Urology;  Laterality: N/A;  . CYSTOSCOPY WITH INJECTION N/A 06/29/2017   Procedure: CYSTOSCOPY WITH INJECTION OF INDOCYANINE GREEN DYE;  Surgeon: Alexis Frock, MD;  Location: [facility];  Service: Urology;  Laterality: N/A;  . CYSTOSCOPY WITH URETHRAL DILATATION N/A 03/21/2017   Procedure: CYSTOSCOPY;  Surgeon: [name], MD;  Location:  [facility];  Service: Urology;  Laterality: N/A;  . ESOPHAGOGASTRODUODENOSCOPY  12-05-2014  . IR IMAGING GUIDED PORT INSERTION  02/07/2018  . IR REMOVAL TUN ACCESS W/ PORT W/O FL MOD SED  04/14/2018  . TRANSTHORACIC ECHOCARDIOGRAM  01-31-2008   nomral LVF,  ef 55-65%/  mild pulmonic stenosis without regurg.,  peak transpulomonic valve gradient 37mmHg  . TRANSURETHRAL RESECTION OF BLADDER TUMOR N/A 12/31/2014   Procedure: TRANSURETHRAL RESECTION OF BLADDER TUMOR (TURBT);  Surgeon: Lowella Bandy, MD;  [facility]  Service: Urology;  Laterality: N/A;  . TRANSURETHRAL RESECTION OF BLADDER TUMOR N/A 03/21/2017   Procedure: POSSIBLE TRANSURETHRAL RESECTION OF BLADDER TUMOR (TURBT);  Surgeon: [name], MD;  Location: [facility];  Service: Urology;  Laterality: N/A;       Family History  Problem Relation Age of Onset  . Alcoholism Father   . Colon cancer Neg Hx   . Colon polyps Neg Hx   . Diabetes Neg Hx   . Kidney disease Neg Hx   . Esophageal cancer Neg Hx   . Heart disease Neg Hx   . Gallbladder disease Neg Hx   . Allergic rhinitis Neg Hx   . Angioedema Neg Hx   . Asthma Neg Hx   . Atopy Neg Hx   . Eczema Neg Hx   . Immunodeficiency Neg Hx   . Urticaria Neg Hx     Social History   Tobacco Use  . Smoking status: Former Smoker    Packs/day: 1.00    Years: 34.00    Pack years: 34.00    Types: Cigarettes    Quit date: 12/26/1988    Years since quitting: 31.5  . Smokeless tobacco: Never Used  Vaping Use  . Vaping Use: Never used  Substance Use Topics  . Alcohol use: Yes    Alcohol/week: 6.0 standard drinks    Types: 6 Cans of beer per week    Comment: BEER  . Drug use: No    Home Medications Prior to Admission medications   Medication Sig Start Date End Date Taking? Authorizing Provider  acetaminophen (TYLENOL) 325 MG tablet Take 2 tablets (650 mg total) by mouth every 6 (six) hours as needed for mild pain,  fever or headache (or Fever >/= 101). 06/19/20  Yes Emokpae, Courage, MD  clotrimazole-betamethasone (LOTRISONE) cream Apply 1 application topically in the morning and at bedtime. 06/27/20  Yes [provider]  HYDROmorphone (DILAUDID) 4 MG tablet Take 1 tablet (4 mg total) by mouth every 4 (four) hours as needed for severe pain. 07/09/20  Yes [name], MD  lactose free nutrition (BOOST) LIQD Take 237 mLs by mouth 3 (three) times daily between meals.    Yes [provider]  oxyCODONE (OXY IR/ROXICODONE) 5 MG immediate release tablet Take 1-2 tablets (5-10 mg total) by mouth every 4 (four) hours as needed for severe pain. 07/09/20  Yes [name], MD    Allergies  Ciprofloxacin and Cefepime  Review of Systems   Review of Systems  Constitutional: Positive for chills and fever. Negative for fatigue.  HENT: Negative for sore throat and trouble swallowing.   Respiratory: Negative for cough and shortness of breath.   Cardiovascular: Negative for chest pain and palpitations.  Gastrointestinal: Positive for abdominal pain and vomiting. Negative for blood in stool and nausea.  Genitourinary: Positive for dysuria. Negative for flank pain, hematuria, penile swelling and scrotal swelling.       Dark urine in his urostomy bag  Musculoskeletal: Negative for arthralgias, back pain, myalgias, neck pain and neck stiffness.  Skin: Negative for rash.  Neurological: Negative for dizziness, weakness and numbness.  Hematological: Does not bruise/bleed easily.    Physical Exam Updated Vital Signs BP 124/79 (BP Location: Right Arm)   Pulse 95   Temp 98 F (36.7 C) (Oral)   Resp 16   Ht 6' (1.829 m)   Wt 94.3 kg   SpO2 98%   BMI 28.21 kg/m   Physical Exam Vitals and nursing note reviewed.  Constitutional:      General: He is not in acute distress.    Appearance: Normal appearance. He is not ill-appearing or toxic-appearing.  HENT:     Head: Normocephalic.      Mouth/Throat:     Mouth: Mucous membranes are moist.  Neck:     Thyroid: No thyromegaly.     Meningeal: Kernig's sign absent.  Cardiovascular:     Rate and Rhythm: Normal rate and regular rhythm.     Heart sounds: Normal heart sounds.  Pulmonary:     Effort: Pulmonary effort is normal.     Breath sounds: Normal breath sounds. No wheezing.  Abdominal:     Palpations: Abdomen is soft.     Tenderness: There is no abdominal tenderness. There is no guarding or rebound.     Comments: Urostomy present.  No surrounding tenderness or erythema.  Dark yellow urine present in the urostomy bag.  Abdomen is soft and nontender.  Musculoskeletal:        General: Normal range of motion.     Cervical back: Normal range of motion and neck supple.     Right lower leg: No edema.     Left lower leg: No edema.  Skin:    General: Skin is warm and dry.     Capillary Refill: Capillary refill takes less than 2 seconds.     Findings: No rash.  Neurological:     General: No focal deficit present.     Mental Status: He is alert.     Motor: No weakness.     ED Results / Procedures / Treatments   Labs (all labs ordered are listed, but only abnormal results are displayed) Labs Reviewed  URINALYSIS, ROUTINE W REFLEX MICROSCOPIC - Abnormal; Notable for the following components:      Result Value   APPearance HAZY (*)    Hgb urine dipstick SMALL (*)    Protein, ur 30 (*)    Nitrite POSITIVE (*)    Leukocytes,Ua MODERATE (*)    Bacteria, UA MANY (*)    All other components within normal limits  CBC WITH DIFFERENTIAL/PLATELET - Abnormal; Notable for the following components:   WBC 14.1 (*)    RBC 3.64 (*)    Hemoglobin 12.9 (*)    MCV 110.4 (*)    MCH 35.4 (*)    Neutro Abs 11.8 (*)    Lymphs Abs 0.4 (*)  Monocytes Absolute 1.7 (*)    Abs Immature Granulocytes 0.08 (*)    All other components within normal limits  COMPREHENSIVE METABOLIC PANEL - Abnormal; Notable for the following components:    Sodium 132 (*)    Glucose, Bld 118 (*)    Calcium 8.5 (*)    Total Protein 6.0 (*)    Albumin 3.1 (*)    All other components within normal limits  RESP PANEL BY RT-PCR (FLU A&B, COVID) ARPGX2  CULTURE, BLOOD (ROUTINE X 2)  CULTURE, BLOOD (ROUTINE X 2)  URINE CULTURE  LACTIC ACID, PLASMA    EKG None  Radiology No results found.  Procedures Procedures (including critical care time)  Medications Ordered in ED Medications  HYDROmorphone (DILAUDID) tablet 4 mg (4 mg Oral Given 07/13/20 1622)  imipenem-cilastatin (PRIMAXIN) 500 mg in sodium chloride 0.9 % 100 mL IVPB (0 mg Intravenous Stopped 07/13/20 1549)  sodium chloride 0.9 % bolus 500 mL (0 mLs Intravenous Stopped 07/13/20 1622)    ED Course  I have reviewed the triage vital signs and the nursing notes.  Pertinent labs & imaging results that were available during my care of the patient were reviewed by me and considered in my medical decision making (see chart for details).    MDM Rules/Calculators/A&P                          Patient here with a history of bladder cancer with metastases.  Has urostomy placed.  Hospitalized earlier this month for sepsis.  Prior sepsis was likely secondary to UTI.  Prior urine culture grew out Pseudomonas.  On exam today, patient nontoxic-appearing here with symptoms of urinary discomfort and fever chills.  Fever at home of 101.8 last evening.  Improved after taking Tylenol.  He does have mild elevated leukocytosis today.  No fever.  No indication for sepsis test at this time.  Urinalysis does show positive nitrites and leukocytes.  Given patient's immunocompromise state, he will likely need admission for IV antibiotics.   will consult hospitalist.  Patient also seen by Dr. Roderic Palau and care plan discussed.  Consulted hospitalist who agrees to admission. Final Clinical Impression(s) / ED Diagnoses Final diagnoses:  Complicated UTI (urinary tract infection)    Rx / DC Orders ED Discharge  Orders    None       Bufford Lope 07/13/20 1914    Milton Ferguson, MD 07/15/20 [phone]

## 2020-07-13 NOTE — ED Triage Notes (Signed)
Pt to er, pt states that he has a urostomy bag, states that he is worried he might have a uti, states that he had a fever last night and took some tylenol and then had some sweats when he was sleeping.  States that he noticed that the urine in his bag was darker than normal.

## 2020-07-13 NOTE — ED Notes (Signed)
Rm changed to [id number]  Wells Guiles, RN will be nurse   She reports she is in a pt room and will have to call back

## 2020-07-13 NOTE — ED Notes (Signed)
Pt has room assigned   awaiting bed ready

## 2020-07-13 NOTE — ED Notes (Signed)
Report to Rebecca, RN.

## 2020-07-13 NOTE — ED Notes (Signed)
Call to [name]
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
## 2020-07-13 NOTE — ED Notes (Signed)
Pt has bladder Ca and states he cannot lie down or sit for long periods of time- he has removed his VS apparel and ambulates in room and in hall

## 2020-07-13 NOTE — ED Notes (Signed)
Pt w hx of bladder Ca   Here for eval of UTI due to fever last pm  And dark urine

## 2020-07-13 NOTE — ED Notes (Signed)
Call to floor   "room hasn't been assigned yet"   Informed rm 304 has been assigned for about an hour  Unknown when bed will be ready

## 2020-07-13 NOTE — H&P (Addendum)
History and Physical    [name] YPP:[id number] DOB: 01/16/1941 DOA: 07/13/2020  PCP: [name], MD   Patient coming from: Home  I have personally briefly reviewed patient's old medical records in [place]  Chief Complaint: Fever  HPI: [name] is a 79 y.o. male with medical history significant for COPD, bladder cancer with bone metastasis, urostomy status, gram-negative sepsis. Patient presented to the ED with complaints of not feeling well.  He reports yesterday he was feeling fatigued, sluggish, with reduced appetite, 2 episodes of vomiting, he reports similar symptoms anytime he has urinary tract infection, so he checked his temperature and it was 100.8, he took 2 tablets of Tylenol, with improvement in fever.  But later in the night he woke up with his clothes and bedding drenched in sweat, he checked his temperature again and it was 100.  So he took another dose of Tylenol.   He reports his urine was very dark yesterday.  Recent hospitalization 11/1- 11/4, for severe sepsis secondary to complicated Pseudomonas UTI.   ED Course: Temperature 98, heart rate 90s, blood pressure 458K to 998P systolic.  O2 sats greater than 94% on room air.  WBC  14.1.  UA many bacteria, moderate leukocytes, positive nitrite.  IV meropenem started for possible UTI.  Hospitalist to admit for complicated UTI.  Review of Systems: As per HPI all other systems reviewed and negative.  Past Medical History:  Diagnosis Date  . Bladder cancer ([place])   . BPH (benign prostatic hypertrophy)   . Dysuria   . GERD (gastroesophageal reflux disease)   . History of adenomatous polyp of colon    tubular adenoma 06/ 2018  . History of bladder cancer 12/2014   urologist-  dr [name]--  dx High Grade TCC without stromal invasion s/p TURBT and intravesical BCG tx/  recurrent 07/2015 (pTa) TCC  repear intravesical BCG tx   . History of hiatal hernia   . History of urethral stricture   . Nocturia       Past Surgical History:  Procedure Laterality Date  . CARDIAC CATHETERIZATION  1997   normal coronary arteries  . CARDIOVASCULAR STRESS TEST  12-14-2005   Low risk perfusion study/  very small scar in the inferior septum from mid ventricle to apex,  no ischemia/  mild inferior septal hypokinesis/  ef 58%  . CATARACT EXTRACTION W/ INTRAOCULAR LENS  IMPLANT, BILATERAL  2011  . COLONOSCOPY  last one 06/ 2018  . CYSTOSCOPY WITH BIOPSY N/A 08/12/2015   Procedure: CYSTOSCOPY WITH BIOPSY AND FULGERATION;  Surgeon: [name], MD;  Location: [facility];  Service: Urology;  Laterality: N/A;  . CYSTOSCOPY WITH INJECTION N/A 06/29/2017   Procedure: CYSTOSCOPY WITH INJECTION OF INDOCYANINE GREEN DYE;  Surgeon: Alexis Frock, MD;  Location: [facility];  Service: Urology;  Laterality: N/A;  . CYSTOSCOPY WITH URETHRAL DILATATION N/A 03/21/2017   Procedure: CYSTOSCOPY;  Surgeon: [name], MD;  Location: [facility];  Service: Urology;  Laterality: N/A;  . ESOPHAGOGASTRODUODENOSCOPY  12-05-2014  . IR IMAGING GUIDED PORT INSERTION  02/07/2018  . IR REMOVAL TUN ACCESS W/ PORT W/O FL MOD SED  04/14/2018  . TRANSTHORACIC ECHOCARDIOGRAM  01-31-2008   nomral LVF,  ef 55-65%/  mild pulmonic stenosis without regurg.,  peak transpulomonic valve gradient 20mmHg  . TRANSURETHRAL RESECTION OF BLADDER TUMOR N/A 12/31/2014   Procedure: TRANSURETHRAL RESECTION OF BLADDER TUMOR (TURBT);  Surgeon: Lowella Bandy, MD;  Location: [facility];  Service: Urology;  Laterality: N/A;  . TRANSURETHRAL RESECTION OF BLADDER TUMOR N/A 03/21/2017   Procedure: POSSIBLE TRANSURETHRAL RESECTION OF BLADDER TUMOR (TURBT);  Surgeon: [name], MD;  Location[facility];  Service: Urology;  Laterality: N/A;     reports that he quit smoking about 31 years ago. His smoking use included cigarettes. He has a 34.00 pack-year smoking history. He has never used  smokeless tobacco. He reports current alcohol use of about 6.0 standard drinks of alcohol per week. He reports that he does not use drugs.  Allergies  Allergen Reactions  . Ciprofloxacin Hives  . Cefepime Rash    Family History  Problem Relation Age of Onset  . Alcoholism Father   . Colon cancer Neg Hx   . Colon polyps Neg Hx   . Diabetes Neg Hx   . Kidney disease Neg Hx   . Esophageal cancer Neg Hx   . Heart disease Neg Hx   . Gallbladder disease Neg Hx   . Allergic rhinitis Neg Hx   . Angioedema Neg Hx   . Asthma Neg Hx   . Atopy Neg Hx   . Eczema Neg Hx   . Immunodeficiency Neg Hx   . Urticaria Neg Hx     Prior to Admission medications   Medication Sig Start Date End Date Taking? Authorizing Provider  acetaminophen (TYLENOL) 325 MG tablet Take 2 tablets (650 mg total) by mouth every 6 (six) hours as needed for mild pain, fever or headache (or Fever >/= 101). 06/19/20  Yes Jhene Westmoreland, Courage, MD  clotrimazole-betamethasone (LOTRISONE) cream Apply 1 application topically in the morning and at bedtime. 06/27/20  Yes [provider]  HYDROmorphone (DILAUDID) 4 MG tablet Take 1 tablet (4 mg total) by mouth every 4 (four) hours as needed for severe pain. 07/09/20  Yes [name], MD  lactose free nutrition (BOOST) LIQD Take 237 mLs by mouth 3 (three) times daily between meals.    Yes [provider]  oxyCODONE (OXY IR/ROXICODONE) 5 MG immediate release tablet Take 1-2 tablets (5-10 mg total) by mouth every 4 (four) hours as needed for severe pain. 07/09/20  Yes [name], MD    Physical Exam: Vitals:   07/13/20 1213 07/13/20 1350 07/13/20 1400 07/13/20 1532  BP:  128/76 122/70 124/79  Pulse:  95 91 95  Resp:  16  16  Temp:  97.7 F (36.5 C)    TempSrc:  Oral    SpO2:  98% 94% 98%  Weight: 94.3 kg     Height: 6' (1.829 m)       Constitutional: NAD, calm, comfortable Vitals:   07/13/20 1213 07/13/20 1350 07/13/20 1400 07/13/20 1532  BP:   128/76 122/70 124/79  Pulse:  95 91 95  Resp:  16  16  Temp:  97.7 F (36.5 C)    TempSrc:  Oral    SpO2:  98% 94% 98%  Weight: 94.3 kg     Height: 6' (1.829 m)      Eyes: PERRL, lids and conjunctivae normal ENMT: Mucous membranes are dry.  Neck: normal, supple, no masses, no thyromegaly Respiratory: clear to auscultation bilaterally,  Normal respiratory effort. No accessory muscle use.  Cardiovascular: Regular rate and rhythm,No extremity edema. 2+ pedal pulses. No carotid bruits.  Abdomen: no tenderness, no masses palpated. No hepatosplenomegaly. Bowel sounds positive.  Urostomy bag right side of abdomen, draining clear urine.  Surrounding skin clean without erythema  or tenderness. Musculoskeletal: no clubbing / cyanosis. No joint deformity upper and lower extremities. Good ROM, no contractures. Normal muscle tone.  Skin: no rashes, lesions, ulcers. No induration Neurologic: No apparent cranial abnormality, moving extremities spontaneously Psychiatric: Normal judgment and insight. Alert and oriented x 3. Normal mood.   Labs on Admission: I have personally reviewed following labs and imaging studies  CBC: Recent Labs  Lab 07/09/20 1431 07/13/20 1339  WBC 7.6 14.1*  NEUTROABS 5.9 11.8*  HGB 14.4 12.9*  HCT 43.8 40.2  MCV 106.6* 110.4*  PLT 209 948   Basic Metabolic Panel: Recent Labs  Lab 07/09/20 1431 07/13/20 1339  NA 141 132*  K 3.8 3.8  CL 108 100  CO2 22 24  GLUCOSE 111* 118*  BUN 14 21  CREATININE 1.08 1.10  CALCIUM 9.3 8.5*   Liver Function Tests: Recent Labs  Lab 07/09/20 1431 07/13/20 1339  AST 17 18  ALT 16 16  ALKPHOS 69 47  BILITOT 0.4 1.0  PROT 6.2* 6.0*  ALBUMIN 2.9* 3.1*   Urine analysis:    Component Value Date/Time   COLORURINE YELLOW 07/13/2020 1222   APPEARANCEUR HAZY (A) 07/13/2020 1222   LABSPEC 1.010 07/13/2020 1222   PHURINE 6.0 07/13/2020 1222   GLUCOSEU NEGATIVE 07/13/2020 1222   HGBUR SMALL (A) 07/13/2020 1222    BILIRUBINUR NEGATIVE 07/13/2020 [place] 07/13/2020 1222   PROTEINUR 30 (A) 07/13/2020 1222   NITRITE POSITIVE (A) 07/13/2020 1222   LEUKOCYTESUR MODERATE (A) 07/13/2020 1222    Radiological Exams on Admission: No results found.  EKG: None.   Assessment/Plan Principal Problem:   Complicated UTI (urinary tract infection) Active Problems:   Malignant neoplasm of urinary bladder (HCC)   Bone metastasis ([place])   Cancer with pulmonary metastases (HCC)   Sepsis ([place])   Complicated urinary tract infection-urostomy status.  Rules in for sepsis with tachycardia heart rate 90s, leukocytosis of 14.1.  But with normal lactic acid 1.1.  Stable blood pressure.  Last urine cultures 11/1 - grew Pseudomonas infection, sensitive to ciprofloxacin, but patient has allergy to Cipro, also resistant to cephalosporins. -Continue IV meropenem pharmacy to dose -Follow-up urine cultures -Obtain blood cultures - 532ml bolus given, cont N/s + 20 KCl 100cc/hr x 15hrs - BMP, CBC in a.m  History of bladder cancer with bone and pulmonary mets -follows with Dr. Alen Blew. Per notes- currently on active surveillance. -Resume chronic pain meds Dilaudid.  Urostomy Status-c/n Routine urostomy care orders   DVT prophylaxis: Lovenox Code Status: Full code Family Communication: None at bedside Disposition Plan:  > 2 days, pending treatment of UTI, and urine cultures. Consults called: None Admission status: Inpt, med-surg I certify that at the point of admission it is my clinical judgment that the patient will require inpatient hospital care spanning beyond 2 midnights from the point of admission due to high intensity of service, high risk for further deterioration and high frequency of surveillance required. The following factors support the patient status of inpatient: History of multidrug-resistant urinary tract infection requiring IV antibiotics.   Bethena Roys MD [facility]  07/13/2020, 7:06 PM

## 2020-07-14 DIAGNOSIS — C7800 Secondary malignant neoplasm of unspecified lung: Secondary | ICD-10-CM | POA: Diagnosis not present

## 2020-07-14 DIAGNOSIS — C7951 Secondary malignant neoplasm of bone: Secondary | ICD-10-CM

## 2020-07-14 DIAGNOSIS — C679 Malignant neoplasm of bladder, unspecified: Secondary | ICD-10-CM | POA: Diagnosis not present

## 2020-07-14 DIAGNOSIS — N390 Urinary tract infection, site not specified: Secondary | ICD-10-CM | POA: Diagnosis not present

## 2020-07-14 LAB — CBC
HCT: 35 % — ABNORMAL LOW (ref 39.0–52.0)
Hemoglobin: 11.3 g/dL — ABNORMAL LOW (ref 13.0–17.0)
MCH: 35.4 pg — ABNORMAL HIGH (ref 26.0–34.0)
MCHC: 32.3 g/dL (ref 30.0–36.0)
MCV: 109.7 fL — ABNORMAL HIGH (ref 80.0–100.0)
Platelets: 174 10*3/uL (ref 150–400)
RBC: 3.19 MIL/uL — ABNORMAL LOW (ref 4.22–5.81)
RDW: 13 % (ref 11.5–15.5)
WBC: 7.3 10*3/uL (ref 4.0–10.5)
nRBC: 0 % (ref 0.0–0.2)

## 2020-07-14 LAB — BASIC METABOLIC PANEL
Anion gap: 7 (ref 5–15)
BUN: 14 mg/dL (ref 8–23)
CO2: 23 mmol/L (ref 22–32)
Calcium: 8.2 mg/dL — ABNORMAL LOW (ref 8.9–10.3)
Chloride: 105 mmol/L (ref 98–111)
Creatinine, Ser: 0.87 mg/dL (ref 0.61–1.24)
GFR, Estimated: >60 mL/min (ref >60)
Glucose, Bld: 94 mg/dL (ref 70–99)
Potassium: 3.6 mmol/L (ref 3.5–5.1)
Sodium: 135 mmol/L (ref 135–145)

## 2020-07-14 LAB — VITAMIN B12: Vitamin B-12: 129 pg/mL — ABNORMAL LOW (ref 180–914)

## 2020-07-14 LAB — FOLATE: Folate: 10.2 ng/mL (ref >5.9)

## 2020-07-14 NOTE — Progress Notes (Signed)
PROGRESS NOTE    [name]  VQQ:[id number] DOB: October 10, 1940 DOA: 07/13/2020 PCP: [name], MD    Chief Complaint  Patient presents with  . Urinary Tract Infection    Brief Narrative:  As per H&P written by Dr. Denton Brick on 07/13/2020 [name] is a 79 y.o. male with medical history significant for COPD, bladder cancer with bone metastasis, urostomy status, gram-negative sepsis. Patient presented to the ED with complaints of not feeling well.  He reports yesterday he was feeling fatigued, sluggish, with reduced appetite, 2 episodes of vomiting, he reports similar symptoms anytime he has urinary tract infection, so he checked his temperature and it was 100.8, he took 2 tablets of Tylenol, with improvement in fever.  But later in the night he woke up with his clothes and bedding drenched in sweat, he checked his temperature again and it was 100.  So he took another dose of Tylenol.   He reports his urine was very dark yesterday.  Recent hospitalization 11/1- 11/4, for severe sepsis secondary to complicated Pseudomonas UTI.   ED Course: Temperature 98, heart rate 90s, blood pressure 433I to 951O systolic.  O2 sats greater than 94% on room air.  WBC  14.1.  UA many bacteria, moderate leukocytes, positive nitrite.  IV meropenem started for possible UTI.  Hospitalist to admit for complicated UTI.  Assessment & Plan: 1-Sepsis secondary to complicated UTI (urinary tract infection) -Present on admission -Patient with a prior history of urostomy and recent Pseudomonas infection resistant to cephalosporin. -Patient allergy to ciprofloxacin. -Will follow culture results and sensitivity -Continue IV meropenem -Will continue IV fluids and supportive care -As needed antipyretics and antiemetics -Follow clinical response   2-history of bladder cancer with bone and pulmonary metastases -Continue outpatient follow-up with oncology service (Dr. Alen Blew) -Currently on active  surveillance -Continue as needed analgesics.  3-leukocytosis -In the setting of sepsis on presentation -Down to normal range after fluid resuscitation. -Will follow trend.  4-macrocytosis -Will check A41 and folic acid.    DVT prophylaxis: Lovenox Code Status: Full code. Family Communication: No family at bedside. Disposition:   Status is: Inpatient  Dispo: The patient is from: Home              Anticipated d/c is to: Home              Anticipated d/c date is: 1-2 days.              Patient currently no medically stable for discharge; urine culture and sensitivity still pending.  Patient course in his first 24 hours without fever.  Continue IV antibiotics.  Continue IV fluids and supportive care.    Consultants:   None   Procedures:  See below for x-ray reports   Antimicrobials:  Meropenem   Subjective: Afebrile currently; denies chest pain, no nausea or vomiting.  Reports feeling better.  Objective: Vitals:   07/13/20 2127 07/14/20 0344 07/14/20 0747 07/14/20 1425  BP:  110/72 (!) 100/56 125/70  Pulse:  95 85 89  Resp:  18 18 18   Temp:  98.9 F (37.2 C) 98.8 F (37.1 C) 98.2 F (36.8 C)  TempSrc:  Oral Oral Oral  SpO2: 95% 97% 96% 97%  Weight:      Height:        Intake/Output Summary (Last 24 hours) at 07/14/2020 1719 Last data filed at 07/14/2020 1426 Gross per 24 hour  Intake 1445 ml  Output 850 ml  Net 595 ml  Filed Weights   07/13/20 1213  Weight: 94.3 kg    Examination:  General exam: Appears calm and comfortable; no chest pain and currently afebrile; patient denies nausea or vomiting. Respiratory system: Clear to auscultation. Respiratory effort normal.  No using accessory muscle. Cardiovascular system: S1 & S2 heard, RRR. No JVD, murmurs, rubs, gallops or clicks. No pedal edema. Gastrointestinal system: Abdomen is nondistended, soft and nontender.  Urostomy bag in place; no organomegaly or masses felt. Normal bowel sounds heard.  Central nervous system: Alert and oriented. No focal neurological deficits. Extremities: No cyanosis or clubbing. Skin: No rashes, no petechiae. Psychiatry: Judgement and insight appear normal. Mood & affect appropriate.     Data Reviewed: I have personally reviewed following labs and imaging studies  CBC: Recent Labs  Lab 07/09/20 1431 07/13/20 1339 07/14/20 0810  WBC 7.6 14.1* 7.3  NEUTROABS 5.9 11.8*  --   HGB 14.4 12.9* 11.3*  HCT 43.8 40.2 35.0*  MCV 106.6* 110.4* 109.7*  PLT 209 180 659    Basic Metabolic Panel: Recent Labs  Lab 07/09/20 1431 07/13/20 1339 07/14/20 0810  NA 141 132* 135  K 3.8 3.8 3.6  CL 108 100 105  CO2 22 24 23   GLUCOSE 111* 118* 94  BUN 14 21 14   CREATININE 1.08 1.10 0.87  CALCIUM 9.3 8.5* 8.2*    GFR: Estimated Creatinine Clearance: 82.1 mL/min (by C-G formula based on SCr of 0.87 mg/dL).  Liver Function Tests: Recent Labs  Lab 07/09/20 1431 07/13/20 1339  AST 17 18  ALT 16 16  ALKPHOS 69 47  BILITOT 0.4 1.0  PROT 6.2* 6.0*  ALBUMIN 2.9* 3.1*    CBG: No results for input(s): GLUCAP in the last 168 hours.   Recent Results (from the past 240 hour(s))  Resp Panel by RT-PCR (Flu A&B, Covid) Nasopharyngeal Swab     Status: None   Collection Time: 07/13/20  3:00 PM   Specimen: Nasopharyngeal Swab; Nasopharyngeal(NP) swabs in vial transport medium  Result Value Ref Range Status   SARS Coronavirus 2 by RT PCR NEGATIVE NEGATIVE Final    Comment: (NOTE) SARS-CoV-2 target nucleic acids are NOT DETECTED.  The SARS-CoV-2 RNA is generally detectable in upper respiratory specimens during the acute phase of infection. The lowest concentration of SARS-CoV-2 viral copies this assay can detect is 138 copies/mL. A negative result does not preclude SARS-Cov-2 infection and should not be used as the sole basis for treatment or other patient management decisions. A negative result may occur with  improper specimen collection/handling,  submission of specimen other than nasopharyngeal swab, presence of viral mutation(s) within the areas targeted by this assay, and inadequate number of viral copies(<138 copies/mL). A negative result must be combined with clinical observations, patient history, and epidemiological information. The expected result is Negative.  Fact Sheet for Patients:  EntrepreneurPulse.com.au  Fact Sheet for Healthcare Providers:  IncredibleEmployment.be  This test is no t yet approved or cleared by the [place] FDA and  has been authorized for detection and/or diagnosis of SARS-CoV-2 by FDA under an Emergency Use Authorization (EUA). This EUA will remain  in effect (meaning this test can be used) for the duration of the COVID-19 declaration under Section 564(b)(1) of the Act, 21 U.S.C.section 360bbb-3(b)(1), unless the authorization is terminated  or revoked sooner.       Influenza A by PCR NEGATIVE NEGATIVE Final   Influenza B by PCR NEGATIVE NEGATIVE Final    Comment: (NOTE) The Xpert Xpress SARS-CoV-2/FLU/RSV plus  assay is intended as an aid in the diagnosis of influenza from Nasopharyngeal swab specimens and should not be used as a sole basis for treatment. Nasal washings and aspirates are unacceptable for Xpert Xpress SARS-CoV-2/FLU/RSV testing.  Fact Sheet for Patients: EntrepreneurPulse.com.au  Fact Sheet for Healthcare Providers: IncredibleEmployment.be  This test is not yet approved or cleared by the [place] FDA and has been authorized for detection and/or diagnosis of SARS-CoV-2 by FDA under an Emergency Use Authorization (EUA). This EUA will remain in effect (meaning this test can be used) for the duration of the COVID-19 declaration under Section 564(b)(1) of the Act, 21 U.S.C. section 360bbb-3(b)(1), unless the authorization is terminated or revoked.  Performed at [facility], [address], [place], [place] [address]   Culture, blood (routine x 2)     Status: None (Preliminary result)   Collection Time: 07/13/20  4:46 PM   Specimen: BLOOD LEFT WRIST  Result Value Ref Range Status   Specimen Description BLOOD LEFT WRIST  Final   Special Requests   Final    BOTTLES DRAWN AEROBIC AND ANAEROBIC Blood Culture adequate volume   Culture   Final    NO GROWTH < 24 HOURS Performed at [facility], [address], [place], [place] [address]    Report Status PENDING  Incomplete  Culture, blood (routine x 2)     Status: None (Preliminary result)   Collection Time: 07/13/20  4:54 PM   Specimen: BLOOD RIGHT WRIST  Result Value Ref Range Status   Specimen Description BLOOD RIGHT WRIST  Final   Special Requests   Final    BOTTLES DRAWN AEROBIC AND ANAEROBIC Blood Culture adequate volume   Culture   Final    NO GROWTH < 24 HOURS Performed at [facility], [address] [place], [place] [address]    Report Status PENDING  Incomplete     Radiology Studies: No results found.   Scheduled Meds: . enoxaparin (LOVENOX) injection  40 mg Subcutaneous Q24H  . feeding supplement  237 mL Oral TID BM   Continuous Infusions: . meropenem (MERREM) IV 1 g (07/14/20 1320)     LOS: 1 day    Time spent: 30 minutes    Barton Dubois, MD [facility]   To contact the attending provider between 7A-7P or the covering provider during after hours 7P-7A, please log into the web site [email or web address] and access using universal [place] password for that web site. If you do not have the password, please call the hospital operator.  07/14/2020, 5:19 PM

## 2020-07-15 DIAGNOSIS — C7800 Secondary malignant neoplasm of unspecified lung: Secondary | ICD-10-CM | POA: Diagnosis not present

## 2020-07-15 DIAGNOSIS — N390 Urinary tract infection, site not specified: Secondary | ICD-10-CM | POA: Diagnosis not present

## 2020-07-15 DIAGNOSIS — C679 Malignant neoplasm of bladder, unspecified: Secondary | ICD-10-CM | POA: Diagnosis not present

## 2020-07-15 DIAGNOSIS — C7951 Secondary malignant neoplasm of bone: Secondary | ICD-10-CM | POA: Diagnosis not present

## 2020-07-15 MED ORDER — CYANOCOBALAMIN 1000 MCG/ML IJ SOLN
1000.0000 ug | Freq: Every day | INTRAMUSCULAR | Status: DC
  Administered 2020-07-15 – 2020-07-18 (×4): 1000 ug via INTRAMUSCULAR
  Filled 2020-07-15 (×4): qty 1

## 2020-07-15 NOTE — Progress Notes (Signed)
PROGRESS NOTE    [name]  ZOX:[id number] DOB: 1940-10-04 DOA: 07/13/2020 PCP: [name], MD    Chief Complaint  Patient presents with  . Urinary Tract Infection    Brief Narrative:  As per H&P written by Dr. Denton Brick on 07/13/2020 [name] is a 79 y.o. [name] with medical history significant for COPD, bladder cancer with bone metastasis, urostomy status, gram-negative sepsis. Patient presented to the ED with complaints of not feeling well.  He reports yesterday he was feeling fatigued, sluggish, with reduced appetite, 2 episodes of vomiting, he reports similar symptoms anytime he has urinary tract infection, so he checked his temperature and it was 100.8, he took 2 tablets of Tylenol, with improvement in fever.  But later in the night he woke up with his clothes and bedding drenched in sweat, he checked his temperature again and it was 100.  So he took another dose of Tylenol.   He reports his urine was very dark yesterday.  Recent hospitalization 11/1- 11/4, for severe sepsis secondary to complicated Pseudomonas UTI.   ED Course: Temperature 98, heart rate 90s, blood pressure 811B to 147W systolic.  O2 sats greater than 94% on room air.  WBC  14.1.  UA many bacteria, moderate leukocytes, positive nitrite.  IV meropenem started for possible UTI.  Hospitalist to admit for complicated UTI.  Assessment & Plan: 1-Sepsis secondary to complicated UTI (urinary tract infection) -Present on admission -Patient with a prior history of urostomy and recent Pseudomonas infection resistant to cephalosporin. -Patient also with history of allergic reaction to ciprofloxacin and cefepime. -Urine culture demonstrating Pseudomonas; sensitivity pending -Case discussed with Dr. [name] (infectious disease) who has recommended to complete a total of 5 days IV antibiotics using meropenem. -Continue IV meropenem for 2 more days following instructions and recommendations by infectious disease.  (day 3/5) -Will continue supportive care, and As needed antipyretics and antiemetics  2-history of bladder cancer with bone and pulmonary metastases -Continue outpatient follow-up with oncology service (Dr. Alen Blew) -Currently on active surveillance -Continue as needed analgesics.  3-leukocytosis -In the setting of sepsis on presentation -Down to normal range after fluid resuscitation. -Continue current antibiotics and follow WBCs trend intermittently.  4-macrocytosis due to B12 deficiency -Vitamin B12 129; will start repletion.   DVT prophylaxis: Lovenox Code Status: Full code. Family Communication: No family at bedside. Disposition:   Status is: Inpatient  Dispo: The patient is from: Home              Anticipated d/c is to: Home              Anticipated d/c date is: 2 days              Patient currently no medically stable for discharge; urine culture demonstrating Pseudomonas infection, with pending sensitivity.  After discussing with infectious disease recommendations given for a total of 5 days of IV meropenem.  Continue therapy as instructed.      Consultants:   ID (Dr. [name] curbside)   Procedures:  See below for x-ray reports   Antimicrobials:  Meropenem   Subjective: Afebrile; denies chest pain, no shortness of breath, no nausea or vomiting.  Expressing chills overnight.  No abdominal pain.  Objective: Vitals:   07/14/20 2126 07/15/20 0530 07/15/20 1259 07/15/20 1625  BP: 114/71 104/84 138/73 (!) 111/58  Pulse: 83 78 78 83  Resp: 18 18 20 18   Temp: 98 F (36.7 C) (!) 97.5 F (36.4 C) 98.1 F (  36.7 C) 98.9 F (37.2 C)  TempSrc: Oral Oral Oral   SpO2: 96% 97% 97% 96%  Weight:      Height:       No intake or output data in the 24 hours ending 07/15/20 1627 Filed Weights   07/13/20 1213  Weight: 94.3 kg    Examination: General exam: Alert, awake, oriented x 3; no chest pain, no shortness of breath, no nausea vomiting.  Patient expressing  chills overnight.  Denies abdominal discomfort. Respiratory system: Clear to auscultation. Respiratory effort normal.  No using accessory muscle. Cardiovascular system:RRR. No murmurs, rubs, gallops.  No JVD. Gastrointestinal system: Abdomen is nondistended, soft and nontender. No organomegaly or masses felt. Normal bowel sounds heard.  Urostomy bag in place. Central nervous system: Alert and oriented. No focal neurological deficits. Extremities: No cyanosis or clubbing. Skin: No rashes, no petechiae. Psychiatry: Judgement and insight appear normal. Mood & affect appropriate.    Data Reviewed: I have personally reviewed following labs and imaging studies  CBC: Recent Labs  Lab 07/09/20 1431 07/13/20 1339 07/14/20 0810  WBC 7.6 14.1* 7.3  NEUTROABS 5.9 11.8*  --   HGB 14.4 12.9* 11.3*  HCT 43.8 40.2 35.0*  MCV 106.6* 110.4* 109.7*  PLT 209 180 947    Basic Metabolic Panel: Recent Labs  Lab 07/09/20 1431 07/13/20 1339 07/14/20 0810  NA 141 132* 135  K 3.8 3.8 3.6  CL 108 100 105  CO2 22 24 23   GLUCOSE 111* 118* 94  BUN 14 21 14   CREATININE 1.08 1.10 0.87  CALCIUM 9.3 8.5* 8.2*    GFR: Estimated Creatinine Clearance: 82.1 mL/min (by C-G formula based on SCr of 0.87 mg/dL).  Liver Function Tests: Recent Labs  Lab 07/09/20 1431 07/13/20 1339  AST 17 18  ALT 16 16  ALKPHOS 69 47  BILITOT 0.4 1.0  PROT 6.2* 6.0*  ALBUMIN 2.9* 3.1*    CBG: No results for input(s): GLUCAP in the last 168 hours.   Recent Results (from the past 240 hour(s))  Urine culture     Status: Abnormal (Preliminary result)   Collection Time: 07/13/20  1:06 PM   Specimen: Urine, Clean Catch  Result Value Ref Range Status   Specimen Description   Final    URINE, CLEAN CATCH Performed at [facility], [address], [place], [place] [address]    Special Requests   Final    NONE Performed at [facility], [address], [place], [place] [address]    Culture (A)  Final    >=100,000  COLONIES/mL PSEUDOMONAS AERUGINOSA >=100,000 COLONIES/mL ENTEROCOCCUS FAECIUM SUSCEPTIBILITIES TO FOLLOW Performed at [facility] [address] [place], [place] [address]    Report Status PENDING  Incomplete  Resp Panel by RT-PCR (Flu A&B, Covid) Nasopharyngeal Swab     Status: None   Collection Time: 07/13/20  3:00 PM   Specimen: Nasopharyngeal Swab; Nasopharyngeal(NP) swabs in vial transport medium  Result Value Ref Range Status   SARS Coronavirus 2 by RT PCR NEGATIVE NEGATIVE Final    Comment: (NOTE) SARS-CoV-2 target nucleic acids are NOT DETECTED.  The SARS-CoV-2 RNA is generally detectable in upper respiratory specimens during the acute phase of infection. The lowest concentration of SARS-CoV-2 viral copies this assay can detect is 138 copies/mL. A negative result does not preclude SARS-Cov-2 infection and should not be used as the sole basis for treatment or other patient management decisions. A negative result may occur with  improper specimen collection/handling,  submission of specimen other than nasopharyngeal swab, presence of viral mutation(s) within the areas targeted by this assay, and inadequate number of viral copies(<138 copies/mL). A negative result must be combined with clinical observations, patient history, and epidemiological information. The expected result is Negative.  Fact Sheet for Patients:  EntrepreneurPulse.com.au  Fact Sheet for Healthcare Providers:  IncredibleEmployment.be  This test is no t yet approved or cleared by the [place] FDA and  has been authorized for detection and/or diagnosis of SARS-CoV-2 by FDA under an Emergency Use Authorization (EUA). This EUA will remain  in effect (meaning this test can be used) for the duration of the COVID-19 declaration under Section 564(b)(1) of the Act, 21 U.S.C.section 360bbb-3(b)(1), unless the authorization is terminated  or revoked sooner.        Influenza A by PCR NEGATIVE NEGATIVE Final   Influenza B by PCR NEGATIVE NEGATIVE Final    Comment: (NOTE) The Xpert Xpress SARS-CoV-2/FLU/RSV plus assay is intended as an aid in the diagnosis of influenza from Nasopharyngeal swab specimens and should not be used as a sole basis for treatment. Nasal washings and aspirates are unacceptable for Xpert Xpress SARS-CoV-2/FLU/RSV testing.  Fact Sheet for Patients: EntrepreneurPulse.com.au  Fact Sheet for Healthcare Providers: IncredibleEmployment.be  This test is not yet approved or cleared by the [place] FDA and has been authorized for detection and/or diagnosis of SARS-CoV-2 by FDA under an Emergency Use Authorization (EUA). This EUA will remain in effect (meaning this test can be used) for the duration of the COVID-19 declaration under Section 564(b)(1) of the Act, 21 U.S.C. section 360bbb-3(b)(1), unless the authorization is terminated or revoked.  Performed at [facility], [address] [place], [place] [address]   Culture, blood (routine x 2)     Status: None (Preliminary result)   Collection Time: 07/13/20  4:46 PM   Specimen: BLOOD LEFT WRIST  Result Value Ref Range Status   Specimen Description BLOOD LEFT WRIST  Final   Special Requests   Final    BOTTLES DRAWN AEROBIC AND ANAEROBIC Blood Culture adequate volume   Culture   Final    NO GROWTH 2 DAYS Performed at [facility], [address], [place], [place] [address]    Report Status PENDING  Incomplete  Culture, blood (routine x 2)     Status: None (Preliminary result)   Collection Time: 07/13/20  4:54 PM   Specimen: BLOOD RIGHT WRIST  Result Value Ref Range Status   Specimen Description BLOOD RIGHT WRIST  Final   Special Requests   Final    BOTTLES DRAWN AEROBIC AND ANAEROBIC Blood Culture adequate volume   Culture   Final    NO GROWTH 2 DAYS Performed at [facility], [address], [place],  [address]    Report  Status PENDING  Incomplete     Radiology Studies: No results found.   Scheduled Meds: . enoxaparin (LOVENOX) injection  40 mg Subcutaneous Q24H  . feeding supplement  237 mL Oral TID BM   Continuous Infusions: . meropenem (MERREM) IV 1 g (07/15/20 1424)     LOS: 2 days    Time spent: 30 minutes    Barton Dubois, MD [facility]   To contact the attending provider between 7A-7P or the covering provider during after hours 7P-7A, please log into the web site [email or web address] and access using universal [place] password for that web site. If you do not have the password, please call the hospital operator.  07/15/2020, 4:27  PM    

## 2020-07-15 NOTE — Plan of Care (Signed)

## 2020-07-16 DIAGNOSIS — N390 Urinary tract infection, site not specified: Secondary | ICD-10-CM | POA: Diagnosis not present

## 2020-07-16 LAB — URINE CULTURE: Culture: >=100000 — AB

## 2020-07-16 MED ORDER — LINEZOLID 600 MG PO TABS
600.0000 mg | ORAL_TABLET | Freq: Two times a day (BID) | ORAL | Status: DC
  Administered 2020-07-16 – 2020-07-18 (×4): 600 mg via ORAL
  Filled 2020-07-16 (×10): qty 1

## 2020-07-16 NOTE — Progress Notes (Signed)
PROGRESS NOTE    [name]  PPJ:[id number] DOB: March 02, 1941 DOA: 07/13/2020 PCP: [name], MD    Chief Complaint  Patient presents with  . Urinary Tract Infection    Brief Narrative:  As per H&P written by Dr. Denton Brick on 07/13/2020 [name] is a 79 y.o. male with medical history significant for COPD, bladder cancer with bone metastasis, urostomy status, gram-negative sepsis. Patient presented to the ED with complaints of not feeling well.  He reports yesterday he was feeling fatigued, sluggish, with reduced appetite, 2 episodes of vomiting, he reports similar symptoms anytime he has urinary tract infection, so he checked his temperature and it was 100.8, he took 2 tablets of Tylenol, with improvement in fever.  But later in the night he woke up with his clothes and bedding drenched in sweat, he checked his temperature again and it was 100.  So he took another dose of Tylenol.   He reports his urine was very dark yesterday.  Recent hospitalization 11/1- 11/4, for severe sepsis secondary to complicated Pseudomonas UTI.   ED Course: Temperature 98, heart rate 90s, blood pressure 580D to 983J systolic.  O2 sats greater than 94% on room air.  WBC  14.1.  UA many bacteria, moderate leukocytes, positive nitrite.  IV meropenem started for possible UTI.  Hospitalist to admit for complicated UTI.  Assessment & Plan: 1-Sepsis secondary to complicated UTI (urinary tract infection) -Present on admission -Patient with a prior history of urostomy and recent Pseudomonas infection resistant to cephalosporin. -Patient also with history of allergic reaction to ciprofloxacin and cefepime. -Urine culture from 07/13/2020 with pansensitive Pseudomonas and VRE -Case previously discussed with Dr. [name] (infectious disease) who has recommended to complete a total of 5 days IV antibiotics using meropenem. -Discussed case with on-call ID physician today Dr. Rosiland Oz -Continue IV  meropenem thru 07/18/20 to complete 5 days of therapy for recurrent/complicated Pseudomonas UTI -Start Zyvox on 07/16/2020 to cover for VRE--- for 2 weeks per ID physician -WBC has normalized  2-history of bladder cancer with bone and pulmonary metastases -Continue outpatient follow-up with oncology service (Dr. Alen Blew) -Currently on active surveillance -Continue as needed analgesics.  3--macrocytosis due to B12 deficiency -Vitamin B12---- 129;  --IM shots ordered  DVT prophylaxis: Lovenox Code Status: Full code. Family Communication: No family at bedside. Disposition:   Status is: Inpatient  Dispo: The patient is from: Home              Anticipated d/c is to: Home              Anticipated d/c date is: 2 days              Patient currently no medically stable for discharge; urine culture demonstrating Pseudomonas infection and VRE,  After discussing with infectious disease recommendations given for a total of 5 days of IV meropenem.  Continue therapy as instructed.      Consultants:   ID (Dr. [name] curbside) Dr. Rosiland Oz   Procedures:  See below for x-ray reports   Antimicrobials:  Meropenem   Subjective: -Patient states starting to feel better, no emesis, no fevers, no diarrhea  Objective: Vitals:   07/15/20 2155 07/15/20 2227 07/16/20 0505 07/16/20 1443  BP: (!) 69/37 (!) 145/88 137/87 112/66  Pulse: 87 87 79 76  Resp: 20  20 17   Temp: 98 F (36.7 C)  97.8 F (36.6 C) 98.2 F (36.8 C)  TempSrc:   Oral Oral  SpO2: 97%  100% 96%  Weight:      Height:        Intake/Output Summary (Last 24 hours) at 07/16/2020 1635 Last data filed at 07/16/2020 1431 Gross per 24 hour  Intake 665 ml  Output 475 ml  Net 190 ml   Filed Weights   07/13/20 1213  Weight: 94.3 kg    Examination: General exam: Alert, awake, oriented x 3;   Respiratory system: Clear to auscultation. Respiratory effort normal.  No using accessory muscle. Cardiovascular system:RRR.  No murmurs, rubs, gallops.  No JVD. Gastrointestinal system: Abdomen is nondistended, soft and nontender.   Normal bowel sounds heard.  Urostomy bag in place with clear urine Central nervous system: Alert and oriented. No focal neurological deficits. Extremities: No cyanosis or clubbing. Skin: No rashes, no petechiae. Psychiatry: Judgement and insight appear normal. Mood & affect appropriate.    Data Reviewed: I have personally reviewed following labs and imaging studies  CBC: Recent Labs  Lab 07/13/20 1339 07/14/20 0810  WBC 14.1* 7.3  NEUTROABS 11.8*  --   HGB 12.9* 11.3*  HCT 40.2 35.0*  MCV 110.4* 109.7*  PLT 180 563    Basic Metabolic Panel: Recent Labs  Lab 07/13/20 1339 07/14/20 0810  NA 132* 135  K 3.8 3.6  CL 100 105  CO2 24 23  GLUCOSE 118* 94  BUN 21 14  CREATININE 1.10 0.87  CALCIUM 8.5* 8.2*    GFR: Estimated Creatinine Clearance: 82.1 mL/min (by C-G formula based on SCr of 0.87 mg/dL).  Liver Function Tests: Recent Labs  Lab 07/13/20 1339  AST 18  ALT 16  ALKPHOS 47  BILITOT 1.0  PROT 6.0*  ALBUMIN 3.1*    CBG: No results for input(s): GLUCAP in the last 168 hours.   Recent Results (from the past 240 hour(s))  Urine culture     Status: Abnormal   Collection Time: 07/13/20  1:06 PM   Specimen: Urine, Clean Catch  Result Value Ref Range Status   Specimen Description   Final    URINE, CLEAN CATCH Performed at [facility], [address], [place], [place] [address]    Special Requests   Final    NONE Performed at [facility], [address], [place], [place] [address]    Culture (A)  Final    >=100,000 COLONIES/mL PSEUDOMONAS AERUGINOSA >=100,000 COLONIES/mL VANCOMYCIN RESISTANT ENTEROCOCCUS ISOLATED    Report Status 07/16/2020 FINAL  Final   Organism ID, Bacteria PSEUDOMONAS AERUGINOSA (A)  Final   Organism ID, Bacteria VANCOMYCIN RESISTANT ENTEROCOCCUS ISOLATED (A)  Final      Susceptibility   Pseudomonas aeruginosa - MIC*     CEFTAZIDIME 4 SENSITIVE Sensitive     CIPROFLOXACIN 1 SENSITIVE Sensitive     GENTAMICIN <=1 SENSITIVE Sensitive     IMIPENEM 2 SENSITIVE Sensitive     PIP/TAZO 64 SENSITIVE Sensitive     CEFEPIME 8 SENSITIVE Sensitive     * >=100,000 COLONIES/mL PSEUDOMONAS AERUGINOSA   Vancomycin resistant enterococcus isolated - MIC*    AMPICILLIN >=32 RESISTANT Resistant     NITROFURANTOIN 128 RESISTANT Resistant     VANCOMYCIN >=32 RESISTANT Resistant     LINEZOLID 2 SENSITIVE Sensitive     * >=100,000 COLONIES/mL VANCOMYCIN RESISTANT ENTEROCOCCUS ISOLATED  Resp Panel by RT-PCR (Flu A&B, Covid) Nasopharyngeal Swab     Status: None   Collection Time: 07/13/20  3:00 PM   Specimen: Nasopharyngeal Swab; Nasopharyngeal(NP) swabs in vial transport medium  Result Value Ref  Range Status   SARS Coronavirus 2 by RT PCR NEGATIVE NEGATIVE Final    Comment: (NOTE) SARS-CoV-2 target nucleic acids are NOT DETECTED.  The SARS-CoV-2 RNA is generally detectable in upper respiratory specimens during the acute phase of infection. The lowest concentration of SARS-CoV-2 viral copies this assay can detect is 138 copies/mL. A negative result does not preclude SARS-Cov-2 infection and should not be used as the sole basis for treatment or other patient management decisions. A negative result may occur with  improper specimen collection/handling, submission of specimen other than nasopharyngeal swab, presence of viral mutation(s) within the areas targeted by this assay, and inadequate number of viral copies(<138 copies/mL). A negative result must be combined with clinical observations, patient history, and epidemiological information. The expected result is Negative.  Fact Sheet for Patients:  EntrepreneurPulse.com.au  Fact Sheet for Healthcare Providers:  IncredibleEmployment.be  This test is no t yet approved or cleared by the [place] FDA and  has been authorized for  detection and/or diagnosis of SARS-CoV-2 by FDA under an Emergency Use Authorization (EUA). This EUA will remain  in effect (meaning this test can be used) for the duration of the COVID-19 declaration under Section 564(b)(1) of the Act, 21 U.S.C.section 360bbb-3(b)(1), unless the authorization is terminated  or revoked sooner.       Influenza A by PCR NEGATIVE NEGATIVE Final   Influenza B by PCR NEGATIVE NEGATIVE Final    Comment: (NOTE) The Xpert Xpress SARS-CoV-2/FLU/RSV plus assay is intended as an aid in the diagnosis of influenza from Nasopharyngeal swab specimens and should not be used as a sole basis for treatment. Nasal washings and aspirates are unacceptable for Xpert Xpress SARS-CoV-2/FLU/RSV testing.  Fact Sheet for Patients: EntrepreneurPulse.com.au  Fact Sheet for Healthcare Providers: IncredibleEmployment.be  This test is not yet approved or cleared by the [place] FDA and has been authorized for detection and/or diagnosis of SARS-CoV-2 by FDA under an Emergency Use Authorization (EUA). This EUA will remain in effect (meaning this test can be used) for the duration of the COVID-19 declaration under Section 564(b)(1) of the Act, 21 U.S.C. section 360bbb-3(b)(1), unless the authorization is terminated or revoked.  Performed at [facility], [address] [place], [place] [address]   Culture, blood (routine x 2)     Status: None (Preliminary result)   Collection Time: 07/13/20  4:46 PM   Specimen: BLOOD LEFT WRIST  Result Value Ref Range Status   Specimen Description BLOOD LEFT WRIST  Final   Special Requests   Final    BOTTLES DRAWN AEROBIC AND ANAEROBIC Blood Culture adequate volume   Culture   Final    NO GROWTH 2 DAYS Performed at [facility], [address] [place], [place] [address]    Report Status PENDING  Incomplete  Culture, blood (routine x 2)     Status: None (Preliminary result)   Collection Time:  07/13/20  4:54 PM   Specimen: BLOOD RIGHT WRIST  Result Value Ref Range Status   Specimen Description BLOOD RIGHT WRIST  Final   Special Requests   Final    BOTTLES DRAWN AEROBIC AND ANAEROBIC Blood Culture adequate volume   Culture   Final    NO GROWTH 2 DAYS Performed at [facility], [address], [place], [place] [address]    Report Status PENDING  Incomplete     Radiology Studies: No results found.  Scheduled Meds: . cyanocobalamin  1,000 mcg Intramuscular Daily  . enoxaparin (LOVENOX) injection  40  mg Subcutaneous Q24H  . feeding supplement  237 mL Oral TID BM   Continuous Infusions: . meropenem (MERREM) IV 1 g (07/16/20 1345)     LOS: 3 days    Roxan Hockey, MD  07/16/2020, 4:35 PM   [facility]

## 2020-07-16 NOTE — Plan of Care (Signed)

## 2020-07-17 ENCOUNTER — Other Ambulatory Visit (HOSPITAL_COMMUNITY): Payer: Self-pay | Admitting: Family Medicine

## 2020-07-17 DIAGNOSIS — N390 Urinary tract infection, site not specified: Secondary | ICD-10-CM | POA: Diagnosis not present

## 2020-07-17 LAB — CBC
HCT: 39 % (ref 39.0–52.0)
Hemoglobin: 12.7 g/dL — ABNORMAL LOW (ref 13.0–17.0)
MCH: 35.2 pg — ABNORMAL HIGH (ref 26.0–34.0)
MCHC: 32.6 g/dL (ref 30.0–36.0)
MCV: 108 fL — ABNORMAL HIGH (ref 80.0–100.0)
Platelets: 250 10*3/uL (ref 150–400)
RBC: 3.61 MIL/uL — ABNORMAL LOW (ref 4.22–5.81)
RDW: 12.5 % (ref 11.5–15.5)
WBC: 7.2 10*3/uL (ref 4.0–10.5)
nRBC: 0 % (ref 0.0–0.2)

## 2020-07-17 LAB — BASIC METABOLIC PANEL
Anion gap: 8 (ref 5–15)
BUN: 15 mg/dL (ref 8–23)
CO2: 24 mmol/L (ref 22–32)
Calcium: 8.6 mg/dL — ABNORMAL LOW (ref 8.9–10.3)
Chloride: 104 mmol/L (ref 98–111)
Creatinine, Ser: 0.97 mg/dL (ref 0.61–1.24)
GFR, Estimated: >60 mL/min (ref >60)
Glucose, Bld: 130 mg/dL — ABNORMAL HIGH (ref 70–99)
Potassium: 3.5 mmol/L (ref 3.5–5.1)
Sodium: 136 mmol/L (ref 135–145)

## 2020-07-17 MED ORDER — LINEZOLID 600 MG PO TABS: 600.0000 mg | ORAL_TABLET | Freq: Two times a day (BID) | ORAL | 0 refills | Status: DC

## 2020-07-17 MED FILL — LINEZOLID 600 MG TAB: 600 | 7 days supply | Qty: 14 | Fill #0

## 2020-07-17 NOTE — Progress Notes (Signed)
PROGRESS NOTE    DAMONT [name]  TIR:[id number] DOB: October 24, 1940 DOA: 07/13/2020 PCP: [name], MD    Chief Complaint  Patient presents with  . Urinary Tract Infection    Brief Narrative:  As per H&P written by Dr. Denton Brick on 07/13/2020 [name] is a 79 y.o. male with medical history significant for COPD, bladder cancer with bone metastasis, urostomy status, gram-negative sepsis. Patient presented to the ED with complaints of not feeling well.  He reports yesterday he was feeling fatigued, sluggish, with reduced appetite, 2 episodes of vomiting, he reports similar symptoms anytime he has urinary tract infection, so he checked his temperature and it was 100.8, he took 2 tablets of Tylenol, with improvement in fever.  But later in the night he woke up with his clothes and bedding drenched in sweat, he checked his temperature again and it was 100.  So he took another dose of Tylenol.   He reports his urine was very dark yesterday.  Recent hospitalization 11/1- 11/4, for severe sepsis secondary to complicated Pseudomonas UTI.   ED Course: Temperature 98, heart rate 90s, blood pressure 676P to 950D systolic.  O2 sats greater than 94% on room air.  WBC  14.1.  UA many bacteria, moderate leukocytes, positive nitrite.  IV meropenem started for possible UTI.  Hospitalist to admit for complicated UTI.  Assessment & Plan: 1-Sepsis secondary to complicated UTI (urinary tract infection)--and the patient with urostomy---POA -Patient with urostomy in situ and recent Pseudomonas infection resistant to cephalosporin. -Patient also with history of allergic reaction to ciprofloxacin and cefepime. -Urine culture from 07/13/2020 with pansensitive Pseudomonas and VRE -Case previously discussed with Dr. [name] (infectious disease) who has recommended to complete a total of 5 days IV  meropenem. -Discussed case with on-call ID physician today Dr. Rosiland Oz -Continue IV meropenem thru  07/18/20 to complete 5 days of therapy for recurrent/complicated Pseudomonas UTI -Started Zyvox on 07/16/2020 to cover for VRE--- for 2 weeks per ID physician -WBC has normalized -  2-history of bladder cancer with bone and pulmonary metastases -Continue outpatient follow-up with oncology service (Dr. Alen Blew) -Currently on active surveillance -Continue as needed analgesics.  3--chronic megaloblastic anemia -noted macrocytosis due to B12 deficiency -Vitamin B12---- 129;  --IM shots ordered -Repeat CBC in a.m.  DVT prophylaxis: Lovenox Code Status: Full code. Family Communication: No family at bedside. Disposition:   Status is: Inpatient  Dispo: The patient is from: Home              Anticipated d/c is to: Home              Anticipated d/c date is: 2 days              Patient currently no medically stable for discharge; urine culture demonstrating Pseudomonas infection and VRE,  After discussing with infectious disease recommendations given for a total of 5 days of IV meropenem.  Continue therapy as instructed.   -Complete IV meropenem on 07/18/2020 and discharged home on p.o. Zyvox on 07/18/2020   Consultants:   ID (Dr. [name] curbside) Dr. Rosiland Oz   Procedures:  See below for x-ray reports   Antimicrobials:  Meropenem Zyvox 07/16/20  Subjective: Appetite is not great, no vomiting or diarrhea no fevers no chills no abdominal pain  Objective: Vitals:   07/16/20 0505 07/16/20 1443 07/16/20 2100 07/17/20 0544  BP: 137/87 112/66 122/74 (!) 156/88  Pulse: 79 76 84 77  Resp: 20 17 16  19  Temp: 97.8 F (36.6 C) 98.2 F (36.8 C) 98 F (36.7 C) 97.9 F (36.6 C)  TempSrc: Oral Oral Oral   SpO2: 100% 96% 98% 97%  Weight:      Height:        Intake/Output Summary (Last 24 hours) at 07/17/2020 1138 Last data filed at 07/16/2020 1431 Gross per 24 hour  Intake 100 ml  Output --  Net 100 ml   Filed Weights   07/13/20 1213  Weight: 94.3 kg     Examination: General exam: Alert, awake, oriented x 3;   Respiratory system: Clear to auscultation. Respiratory effort normal.   Cardiovascular system:RRR. No murmurs, rubs, gallops.  No JVD. Gastrointestinal system: Abdomen is nondistended, soft and nontender.   Normal bowel sounds heard.  Urostomy bag in place with clear urine Central nervous system: Alert and oriented. No focal neurological deficits. Extremities: No cyanosis or clubbing. Skin: No rashes, no petechiae. Psychiatry: Judgement and insight appear normal. Mood & affect appropriate.    Data Reviewed: I have personally reviewed following labs and imaging studies  CBC: Recent Labs  Lab 07/13/20 1339 07/14/20 0810  WBC 14.1* 7.3  NEUTROABS 11.8*  --   HGB 12.9* 11.3*  HCT 40.2 35.0*  MCV 110.4* 109.7*  PLT 180 606    Basic Metabolic Panel: Recent Labs  Lab 07/13/20 1339 07/14/20 0810  NA 132* 135  K 3.8 3.6  CL 100 105  CO2 24 23  GLUCOSE 118* 94  BUN 21 14  CREATININE 1.10 0.87  CALCIUM 8.5* 8.2*    GFR: Estimated Creatinine Clearance: 82.1 mL/min (by C-G formula based on SCr of 0.87 mg/dL).  Liver Function Tests: Recent Labs  Lab 07/13/20 1339  AST 18  ALT 16  ALKPHOS 47  BILITOT 1.0  PROT 6.0*  ALBUMIN 3.1*    CBG: No results for input(s): GLUCAP in the last 168 hours.   Recent Results (from the past 240 hour(s))  Urine culture     Status: Abnormal   Collection Time: 07/13/20  1:06 PM   Specimen: Urine, Clean Catch  Result Value Ref Range Status   Specimen Description   Final    URINE, CLEAN CATCH Performed at [facility], [address], [place], [place] [address]    Special Requests   Final    NONE Performed at [facility], [address] [place], [place] [address]    Culture (A)  Final    >=100,000 COLONIES/mL PSEUDOMONAS AERUGINOSA >=100,000 COLONIES/mL VANCOMYCIN RESISTANT ENTEROCOCCUS ISOLATED    Report Status 07/16/2020 FINAL  Final   Organism ID, Bacteria  PSEUDOMONAS AERUGINOSA (A)  Final   Organism ID, Bacteria VANCOMYCIN RESISTANT ENTEROCOCCUS ISOLATED (A)  Final      Susceptibility   Pseudomonas aeruginosa - MIC*    CEFTAZIDIME 4 SENSITIVE Sensitive     CIPROFLOXACIN 1 SENSITIVE Sensitive     GENTAMICIN <=1 SENSITIVE Sensitive     IMIPENEM 2 SENSITIVE Sensitive     PIP/TAZO 64 SENSITIVE Sensitive     CEFEPIME 8 SENSITIVE Sensitive     * >=100,000 COLONIES/mL PSEUDOMONAS AERUGINOSA   Vancomycin resistant enterococcus isolated - MIC*    AMPICILLIN >=32 RESISTANT Resistant     NITROFURANTOIN 128 RESISTANT Resistant     VANCOMYCIN >=32 RESISTANT Resistant     LINEZOLID 2 SENSITIVE Sensitive     * >=100,000 COLONIES/mL VANCOMYCIN RESISTANT ENTEROCOCCUS ISOLATED  Resp Panel by RT-PCR (Flu A&B, Covid) Nasopharyngeal Swab     Status: None   Collection  Time: 07/13/20  3:00 PM   Specimen: Nasopharyngeal Swab; Nasopharyngeal(NP) swabs in vial transport medium  Result Value Ref Range Status   SARS Coronavirus 2 by RT PCR NEGATIVE NEGATIVE Final    Comment: (NOTE) SARS-CoV-2 target nucleic acids are NOT DETECTED.  The SARS-CoV-2 RNA is generally detectable in upper respiratory specimens during the acute phase of infection. The lowest concentration of SARS-CoV-2 viral copies this assay can detect is 138 copies/mL. A negative result does not preclude SARS-Cov-2 infection and should not be used as the sole basis for treatment or other patient management decisions. A negative result may occur with  improper specimen collection/handling, submission of specimen other than nasopharyngeal swab, presence of viral mutation(s) within the areas targeted by this assay, and inadequate number of viral copies(<138 copies/mL). A negative result must be combined with clinical observations, patient history, and epidemiological information. The expected result is Negative.  Fact Sheet for Patients:  EntrepreneurPulse.com.au  Fact Sheet for  Healthcare Providers:  IncredibleEmployment.be  This test is no t yet approved or cleared by the [place] FDA and  has been authorized for detection and/or diagnosis of SARS-CoV-2 by FDA under an Emergency Use Authorization (EUA). This EUA will remain  in effect (meaning this test can be used) for the duration of the COVID-19 declaration under Section 564(b)(1) of the Act, 21 U.S.C.section 360bbb-3(b)(1), unless the authorization is terminated  or revoked sooner.       Influenza A by PCR NEGATIVE NEGATIVE Final   Influenza B by PCR NEGATIVE NEGATIVE Final    Comment: (NOTE) The Xpert Xpress SARS-CoV-2/FLU/RSV plus assay is intended as an aid in the diagnosis of influenza from Nasopharyngeal swab specimens and should not be used as a sole basis for treatment. Nasal washings and aspirates are unacceptable for Xpert Xpress SARS-CoV-2/FLU/RSV testing.  Fact Sheet for Patients: EntrepreneurPulse.com.au  Fact Sheet for Healthcare Providers: IncredibleEmployment.be  This test is not yet approved or cleared by the [place] FDA and has been authorized for detection and/or diagnosis of SARS-CoV-2 by FDA under an Emergency Use Authorization (EUA). This EUA will remain in effect (meaning this test can be used) for the duration of the COVID-19 declaration under Section 564(b)(1) of the Act, 21 U.S.C. section 360bbb-3(b)(1), unless the authorization is terminated or revoked.  Performed at [facility], [address], [place], [place] [address]   Culture, blood (routine x 2)     Status: None (Preliminary result)   Collection Time: 07/13/20  4:46 PM   Specimen: BLOOD LEFT WRIST  Result Value Ref Range Status   Specimen Description BLOOD LEFT WRIST  Final   Special Requests   Final    BOTTLES DRAWN AEROBIC AND ANAEROBIC Blood Culture adequate volume   Culture   Final    NO GROWTH 4 DAYS Performed at [facility], [address], [place], [place] [address]    Report Status PENDING  Incomplete  Culture, blood (routine x 2)     Status: None (Preliminary result)   Collection Time: 07/13/20  4:54 PM   Specimen: BLOOD RIGHT WRIST  Result Value Ref Range Status   Specimen Description BLOOD RIGHT WRIST  Final   Special Requests   Final    BOTTLES DRAWN AEROBIC AND ANAEROBIC Blood Culture adequate volume   Culture   Final    NO GROWTH 4 DAYS Performed at [facility], [address], [place], [place] [address]    Report Status PENDING  Incomplete     Radiology Studies:  No results found.  Scheduled Meds: . cyanocobalamin  1,000 mcg Intramuscular Daily  . enoxaparin (LOVENOX) injection  40 mg Subcutaneous Q24H  . feeding supplement  237 mL Oral TID BM  . linezolid  600 mg Oral Q12H   Continuous Infusions: . meropenem (MERREM) IV 1 g (07/17/20 0649)     LOS: 4 days    Roxan Hockey, MD  07/17/2020, 11:38 AM   [facility]

## 2020-07-17 NOTE — Plan of Care (Signed)

## 2020-07-18 ENCOUNTER — Ambulatory Visit: Payer: Self-pay

## 2020-07-18 DIAGNOSIS — N390 Urinary tract infection, site not specified: Secondary | ICD-10-CM | POA: Diagnosis not present

## 2020-07-18 LAB — CULTURE, BLOOD (ROUTINE X 2)
Culture: NO GROWTH
Culture: NO GROWTH
Special Requests: ADEQUATE
Special Requests: ADEQUATE

## 2020-07-18 LAB — BASIC METABOLIC PANEL
Anion gap: 8 (ref 5–15)
BUN: 13 mg/dL (ref 8–23)
CO2: 28 mmol/L (ref 22–32)
Calcium: 8.7 mg/dL — ABNORMAL LOW (ref 8.9–10.3)
Chloride: 103 mmol/L (ref 98–111)
Creatinine, Ser: 0.86 mg/dL (ref 0.61–1.24)
GFR, Estimated: >60 mL/min (ref >60)
Glucose, Bld: 101 mg/dL — ABNORMAL HIGH (ref 70–99)
Potassium: 4.2 mmol/L (ref 3.5–5.1)
Sodium: 139 mmol/L (ref 135–145)

## 2020-07-18 LAB — CBC
HCT: 39.6 % (ref 39.0–52.0)
Hemoglobin: 12.9 g/dL — ABNORMAL LOW (ref 13.0–17.0)
MCH: 35.1 pg — ABNORMAL HIGH (ref 26.0–34.0)
MCHC: 32.6 g/dL (ref 30.0–36.0)
MCV: 107.6 fL — ABNORMAL HIGH (ref 80.0–100.0)
Platelets: 287 10*3/uL (ref 150–400)
RBC: 3.68 MIL/uL — ABNORMAL LOW (ref 4.22–5.81)
RDW: 12.7 % (ref 11.5–15.5)
WBC: 6 10*3/uL (ref 4.0–10.5)
nRBC: 0 % (ref 0.0–0.2)

## 2020-07-18 MED ORDER — CYANOCOBALAMIN 1000 MCG/ML IJ SOLN: 1000.0000 ug | INTRAMUSCULAR | 1 refills | Status: DC

## 2020-07-18 MED ORDER — POLYETHYLENE GLYCOL 3350 17 G PO PACK: 17.0000 g | PACK | Freq: Every day | ORAL | 0 refills | Status: DC | PRN

## 2020-07-18 NOTE — Discharge Instructions (Signed)
1)Avoid ibuprofen/Advil/Aleve/Motrin/Goody Powders/Naproxen/BC powders/Meloxicam/Diclofenac/Indomethacin and other Nonsteroidal anti-inflammatory medications as these will make you more likely to bleed and can cause stomach ulcers, can also cause Kidney problems.   2)Take Linezolid/Zyvox 600 mg Twice daily for 1 week   3)Repeat CBC and BMP test in 1 week  4) monthly B12 injections advised starting first week of January 2022

## 2020-07-18 NOTE — Plan of Care (Signed)

## 2020-07-18 NOTE — Care Management Important Message (Signed)
Important Message  Patient Details  Name: [name] MRN: [id number] Date of Birth: September 14, 1940   Medicare Important Message Given:  Yes     Shade Flood, LCSW 07/18/2020, 11:07 AM

## 2020-07-18 NOTE — Discharge Summary (Signed)
[name], is a 79 y.o. male  DOB Mar 05, 1941  MRN [id number].  Admission date:  07/13/2020  Admitting Physician  Bethena Roys, MD  Discharge Date:  07/18/2020   Primary MD  [name], MD  Recommendations for primary care physician for things to follow:   1)Avoid ibuprofen/Advil/Aleve/Motrin/Goody Powders/Naproxen/BC powders/Meloxicam/Diclofenac/Indomethacin and other Nonsteroidal anti-inflammatory medications as these will make you more likely to bleed and can cause stomach ulcers, can also cause Kidney problems.   2)Take Linezolid/Zyvox 600 mg Twice daily for 1 week   3)Repeat CBC and BMP test in 1 week  4) monthly B12 injections advised starting first week of January 2022  Admission Diagnosis  Complicated UTI (urinary tract infection) [N39.0]   Discharge Diagnosis  Complicated UTI (urinary tract infection) [N39.0]    Principal Problem:   Complicated UTI (urinary tract infection) Active Problems:   Malignant neoplasm of urinary bladder (HCC)   Bone metastasis ([place])   Cancer with pulmonary metastases ([place])   Sepsis ([place])      Past Medical History:  Diagnosis Date  . Bladder cancer ([place])   . BPH (benign prostatic hypertrophy)   . Dysuria   . GERD (gastroesophageal reflux disease)   . History of adenomatous polyp of colon    tubular adenoma 06/ 2018  . History of bladder cancer 12/2014   urologist-  dr [name]--  dx High Grade TCC without stromal invasion s/p TURBT and intravesical BCG tx/  recurrent 07/2015 (pTa) TCC  repear intravesical BCG tx   . History of hiatal hernia   . History of urethral stricture   . Nocturia     Past Surgical History:  Procedure Laterality Date  . CARDIAC CATHETERIZATION  1997   normal coronary arteries  . CARDIOVASCULAR STRESS TEST  12-14-2005   Low risk perfusion study/  very small scar in the inferior septum from mid ventricle to apex,   no ischemia/  mild inferior septal hypokinesis/  ef 58%  . CATARACT EXTRACTION W/ INTRAOCULAR LENS  IMPLANT, BILATERAL  2011  . COLONOSCOPY  last one 06/ 2018  . CYSTOSCOPY WITH BIOPSY N/A 08/12/2015   Procedure: CYSTOSCOPY WITH BIOPSY AND FULGERATION;  Surgeon: [name], MD;  Location: [facility];  Service: Urology;  Laterality: N/A;  . CYSTOSCOPY WITH INJECTION N/A 06/29/2017   Procedure: CYSTOSCOPY WITH INJECTION OF INDOCYANINE GREEN DYE;  Surgeon: Alexis Frock, MD;  Location: [facility];  Service: Urology;  Laterality: N/A;  . CYSTOSCOPY WITH URETHRAL DILATATION N/A 03/21/2017   Procedure: CYSTOSCOPY;  Surgeon: [name], MD;  Location: [facility];  Service: Urology;  Laterality: N/A;  . ESOPHAGOGASTRODUODENOSCOPY  12-05-2014  . IR IMAGING GUIDED PORT INSERTION  02/07/2018  . IR REMOVAL TUN ACCESS W/ PORT W/O FL MOD SED  04/14/2018  . TRANSTHORACIC ECHOCARDIOGRAM  01-31-2008   nomral LVF,  ef 55-65%/  mild pulmonic stenosis without regurg.,  peak transpulomonic valve gradient 1mmHg  . TRANSURETHRAL RESECTION OF BLADDER TUMOR N/A 12/31/2014   Procedure: TRANSURETHRAL  RESECTION OF BLADDER TUMOR (TURBT);  Surgeon: Lowella Bandy, MD;  Location: [facility];  Service: Urology;  Laterality: N/A;  . TRANSURETHRAL RESECTION OF BLADDER TUMOR N/A 03/21/2017   Procedure: POSSIBLE TRANSURETHRAL RESECTION OF BLADDER TUMOR (TURBT);  Surgeon: [name], MD;  Location[facility];  Service: Urology;  Laterality: N/A;     HPI  from the history and physical done on the day of admission:   Patient coming from: Home  I have personally briefly reviewed patient's old medical records in [place]  Chief Complaint: Fever  HPI: [name] is a 79 y.o. male with medical history significant for COPD, bladder cancer with bone metastasis, urostomy status, gram-negative sepsis. Patient presented to the ED with  complaints of not feeling well.  He reports yesterday he was feeling fatigued, sluggish, with reduced appetite, 2 episodes of vomiting, he reports similar symptoms anytime he has urinary tract infection, so he checked his temperature and it was 100.8, he took 2 tablets of Tylenol, with improvement in fever.  But later in the night he woke up with his clothes and bedding drenched in sweat, he checked his temperature again and it was 100.  So he took another dose of Tylenol.   He reports his urine was very dark yesterday.  Recent hospitalization 11/1- 11/4, for severe sepsis secondary to complicated Pseudomonas UTI.   ED Course: Temperature 98, heart rate 90s, blood pressure 681L to 572I systolic.  O2 sats greater than 94% on room air.  WBC  14.1.  UA many bacteria, moderate leukocytes, positive nitrite.  IV meropenem started for possible UTI.  Hospitalist to admit for complicated UTI.  Review of Systems: As per HPI all other systems reviewed and negative.     Hospital Course:   Brief Narrative:  As per H&P written by Dr. Denton Brick on 07/13/2020 [name] a 79 y.o.malewith medical history significant forCOPD, bladder cancer with bone metastasis, urostomy status, gram-negative sepsis. Patient presented to the ED with complaints of not feeling well. He reports yesterday he was feeling fatigued, sluggish, with reduced appetite, 2 episodes of vomiting, he reports similar symptoms anytime he has urinary tract infection, so hechecked his temperature and it was 100.8, he took 2 tablets of Tylenol,with improvement in fever. But later in the night he woke up with his clothes and bedding drenched in sweat, he checked his temperature again and it was 100. So he took another dose of Tylenol.  He reports his urine was very dark yesterday.  Recent hospitalization11/1- 11/4,for severe sepsis secondary to complicated Pseudomonas UTI.  ED Course:Temperature 98, heart rate 90s, blood  pressure 203T to 597C systolic. O2 sats greater than 94% on room air. BUL84.5. UA many bacteria, moderate leukocytes, positive nitrite. IV meropenem started for possible UTI. Hospitalist to admit for complicated UTI.  Assessment & Plan: 1-Sepsis secondary to complicated UTI (urinary tract infection)--and the patient with urostomy---POA -Patient with urostomy in situ and recent Pseudomonas infection resistant to cephalosporin. -Patient also with history of allergic reaction to ciprofloxacin and cefepime. -Urine culture from 07/13/2020 with pansensitive Pseudomonas and VRE -Case previously discussed with Dr. [name] (infectious disease) who has recommended to complete a total of 5 days IV  meropenem. -Discussed case with on-call ID physician today Dr. Rosiland Oz -Continue IV meropenem thru 07/18/20 to complete 5 days of therapy for recurrent/complicated Pseudomonas UTI -Started Zyvox on 07/16/2020 to cover for VRE---  -WBC has normalized -Completed IV meropenem for 5  days, being discharged home on p.o. Zyvox for additional 7 days to complete a total of about 10 days -  2-history of bladder cancer with bone and pulmonary metastases -Continue outpatient follow-up with oncology service (Dr. Alen Blew) -Currently on active surveillance -Continue as needed analgesics.  3--chronic megaloblastic anemia -noted macrocytosis due to B12 deficiency -Vitamin B12---- 129;  -Monthly IM B12 injections advised as outpatient   DVT prophylaxis: Lovenox Code Status: Full code. Family Communication: No family at bedside. Disposition:   Status is: Inpatient  Dispo: The patient is from: Home  Anticipated d/c is to: Home  Anticipated d/c date is: 2 days  Patient currently no medically stable for discharge; urine culture demonstrating Pseudomonas infection and VRE,  After discussing with infectious disease recommendations given for a total of 5 days of IV  meropenem.  Continue therapy as instructed.   -Complete IV meropenem on 07/18/2020 and discharged home on p.o. Zyvox on 07/18/2020   Consultants:   ID (Dr. [name] curbside) Dr. Rosiland Oz   Procedures:  See below for x-ray reports   Antimicrobials:  Meropenem Zyvox 07/16/20  Discharge Condition: stable  Follow UP--- PCP and Dr. Alen Blew  Diet and Activity recommendation:  As advised  Discharge Instructions    Discharge Instructions    Call MD for:  difficulty breathing, headache or visual disturbances   Complete by: As directed    Call MD for:  extreme fatigue   Complete by: As directed    Call MD for:  persistant dizziness or light-headedness   Complete by: As directed    Call MD for:  persistant nausea and vomiting   Complete by: As directed    Call MD for:  severe uncontrolled pain   Complete by: As directed    Call MD for:  temperature >100.4   Complete by: As directed    Diet - low sodium heart healthy   Complete by: As directed    Discharge instructions   Complete by: As directed    1)Avoid ibuprofen/Advil/Aleve/Motrin/Goody Powders/Naproxen/BC powders/Meloxicam/Diclofenac/Indomethacin and other Nonsteroidal anti-inflammatory medications as these will make you more likely to bleed and can cause stomach ulcers, can also cause Kidney problems.   2)Take Linezolid/Zyvox 600 mg Twice daily for 1 week   3)Repeat CBC and BMP test in 1 week  4) monthly B12 injections advised starting first week of January 2022   Increase activity slowly   Complete by: As directed         Discharge Medications     Allergies as of 07/18/2020      Reactions   Ciprofloxacin Hives   Cefepime Rash      Medication List    TAKE these medications   acetaminophen 325 MG tablet Commonly known as: TYLENOL Take 2 tablets (650 mg total) by mouth every 6 (six) hours as needed for mild pain, fever or headache (or Fever >/= 101).   clotrimazole-betamethasone cream Commonly  known as: LOTRISONE Apply 1 application topically in the morning and at bedtime.   cyanocobalamin 1000 MCG/ML injection Commonly known as: (VITAMIN B-12) Inject 1 mL (1,000 mcg total) into the muscle every 30 (thirty) days.   HYDROmorphone 4 MG tablet Commonly known as: DILAUDID Take 1 tablet (4 mg total) by mouth every 4 (four) hours as needed for severe pain.   lactose free nutrition Liqd Take 237 mLs by mouth 3 (three) times daily between meals.   linezolid 600 MG tablet Commonly known as: ZYVOX Take 1 tablet (600 mg total) by  mouth every 12 (twelve) hours for 7 days.   oxyCODONE 5 MG immediate release tablet Commonly known as: Oxy IR/ROXICODONE Take 1-2 tablets (5-10 mg total) by mouth every 4 (four) hours as needed for severe pain.   polyethylene glycol 17 g packet Commonly known as: MIRALAX / GLYCOLAX Take 17 g by mouth daily as needed for mild constipation.       Major procedures and Radiology Reports - PLEASE review detailed and final reports for all details, in brief -   No results found.  Micro Results   Recent Results (from the past 240 hour(s))  Urine culture     Status: Abnormal   Collection Time: 07/13/20  1:06 PM   Specimen: Urine, Clean Catch  Result Value Ref Range Status   Specimen Description   Final    URINE, CLEAN CATCH Performed at [facility], [address], [place], [place] [address]    Special Requests   Final    NONE Performed at [facility], [address], [place],  [address]    Culture (A)  Final    >=100,000 COLONIES/mL PSEUDOMONAS AERUGINOSA >=100,000 COLONIES/mL VANCOMYCIN RESISTANT ENTEROCOCCUS ISOLATED    Report Status 07/16/2020 FINAL  Final   Organism ID, Bacteria PSEUDOMONAS AERUGINOSA (A)  Final   Organism ID, Bacteria VANCOMYCIN RESISTANT ENTEROCOCCUS ISOLATED (A)  Final      Susceptibility   Pseudomonas aeruginosa - MIC*    CEFTAZIDIME 4 SENSITIVE Sensitive     CIPROFLOXACIN 1 SENSITIVE Sensitive     GENTAMICIN  <=1 SENSITIVE Sensitive     IMIPENEM 2 SENSITIVE Sensitive     PIP/TAZO 64 SENSITIVE Sensitive     CEFEPIME 8 SENSITIVE Sensitive     * >=100,000 COLONIES/mL PSEUDOMONAS AERUGINOSA   Vancomycin resistant enterococcus isolated - MIC*    AMPICILLIN >=32 RESISTANT Resistant     NITROFURANTOIN 128 RESISTANT Resistant     VANCOMYCIN >=32 RESISTANT Resistant     LINEZOLID 2 SENSITIVE Sensitive     * >=100,000 COLONIES/mL VANCOMYCIN RESISTANT ENTEROCOCCUS ISOLATED  Resp Panel by RT-PCR (Flu A&B, Covid) Nasopharyngeal Swab     Status: None   Collection Time: 07/13/20  3:00 PM   Specimen: Nasopharyngeal Swab; Nasopharyngeal(NP) swabs in vial transport medium  Result Value Ref Range Status   SARS Coronavirus 2 by RT PCR NEGATIVE NEGATIVE Final    Comment: (NOTE) SARS-CoV-2 target nucleic acids are NOT DETECTED.  The SARS-CoV-2 RNA is generally detectable in upper respiratory specimens during the acute phase of infection. The lowest concentration of SARS-CoV-2 viral copies this assay can detect is 138 copies/mL. A negative result does not preclude SARS-Cov-2 infection and should not be used as the sole basis for treatment or other patient management decisions. A negative result may occur with  improper specimen collection/handling, submission of specimen other than nasopharyngeal swab, presence of viral mutation(s) within the areas targeted by this assay, and inadequate number of viral copies(<138 copies/mL). A negative result must be combined with clinical observations, patient history, and epidemiological information. The expected result is Negative.  Fact Sheet for Patients:  EntrepreneurPulse.com.au  Fact Sheet for Healthcare Providers:  IncredibleEmployment.be  This test is no t yet approved or cleared by the [place] FDA and  has been authorized for detection and/or diagnosis of SARS-CoV-2 by FDA under an Emergency Use Authorization (EUA).  This EUA will remain  in effect (meaning this test can be used) for the duration of the COVID-19 declaration under Section 564(b)(1) of the Act, 21 U.S.C.section  360bbb-3(b)(1), unless the authorization is terminated  or revoked sooner.       Influenza A by PCR NEGATIVE NEGATIVE Final   Influenza B by PCR NEGATIVE NEGATIVE Final    Comment: (NOTE) The Xpert Xpress SARS-CoV-2/FLU/RSV plus assay is intended as an aid in the diagnosis of influenza from Nasopharyngeal swab specimens and should not be used as a sole basis for treatment. Nasal washings and aspirates are unacceptable for Xpert Xpress SARS-CoV-2/FLU/RSV testing.  Fact Sheet for Patients: EntrepreneurPulse.com.au  Fact Sheet for Healthcare Providers: IncredibleEmployment.be  This test is not yet approved or cleared by the [place] FDA and has been authorized for detection and/or diagnosis of SARS-CoV-2 by FDA under an Emergency Use Authorization (EUA). This EUA will remain in effect (meaning this test can be used) for the duration of the COVID-19 declaration under Section 564(b)(1) of the Act, 21 U.S.C. section 360bbb-3(b)(1), unless the authorization is terminated or revoked.  Performed at [facility], [address], [place], [place] [address]   Culture, blood (routine x 2)     Status: None   Collection Time: 07/13/20  4:46 PM   Specimen: BLOOD LEFT WRIST  Result Value Ref Range Status   Specimen Description BLOOD LEFT WRIST  Final   Special Requests   Final    BOTTLES DRAWN AEROBIC AND ANAEROBIC Blood Culture adequate volume   Culture   Final    NO GROWTH 5 DAYS Performed at [facility], [address] [place], [place] [address]    Report Status 07/18/2020 FINAL  Final  Culture, blood (routine x 2)     Status: None   Collection Time: 07/13/20  4:54 PM   Specimen: BLOOD RIGHT WRIST  Result Value Ref Range Status   Specimen Description BLOOD RIGHT WRIST  Final    Special Requests   Final    BOTTLES DRAWN AEROBIC AND ANAEROBIC Blood Culture adequate volume   Culture   Final    NO GROWTH 5 DAYS Performed at [facility], [address] [place], [place] [address]    Report Status 07/18/2020 FINAL  Final   Today   Subjective    [name] today has no new complaints No fever  Or chills   No Nausea, Vomiting or Diarrhea      Patient has been seen and examined prior to discharge   Objective   Blood pressure 122/88, pulse 81, temperature 97.8 F (36.6 C), resp. rate 18, height 6' (1.829 m), weight 94.3 kg, SpO2 98 %.   Intake/Output Summary (Last 24 hours) at 07/18/2020 1106 Last data filed at 07/17/2020 1824 Gross per 24 hour  Intake 480 ml  Output --  Net 480 ml    Exam Gen:- Awake Alert, no acute distress  HEENT:- .AT, No sclera icterus Neck-Supple Neck,No JVD,.  Lungs-  CTAB , good air movement bilaterally  CV- S1, S2 normal, regular Abd-  +ve B.Sounds, Abd Soft, No tenderness, urostomy bag in situ, no CVA area tenderness Extremity/Skin:- No  edema,   good pulses Psych-affect is appropriate, oriented x3 Neuro-no new focal deficits, no tremors    Data Review   CBC w Diff:  Lab Results  Component Value Date   WBC 6.0 07/18/2020   HGB 12.9 (L) 07/18/2020   HGB 14.4 07/09/2020   HGB 12.7 (L) 05/23/2017   HCT 39.6 07/18/2020   HCT 37.3 (L) 05/23/2017   PLT 287 07/18/2020   PLT 209 07/09/2020   PLT 298 05/23/2017   LYMPHOPCT 3 07/13/2020  LYMPHOPCT 12.4 (L) 05/23/2017   MONOPCT 12 07/13/2020   MONOPCT 16.8 (H) 05/23/2017   EOSPCT 0 07/13/2020   EOSPCT 1.8 05/23/2017   BASOPCT 0 07/13/2020   BASOPCT 0.8 05/23/2017    CMP:  Lab Results  Component Value Date   NA 139 07/18/2020   NA 140 04/30/2019   NA 141 05/23/2017   K 4.2 07/18/2020   K 4.0 05/23/2017   CL 103 07/18/2020   CO2 28 07/18/2020   CO2 27 05/23/2017   BUN 13 07/18/2020   BUN 16 04/30/2019   BUN 12.5 05/23/2017   CREATININE 0.86 07/18/2020    CREATININE 1.08 07/09/2020   CREATININE 0.8 05/23/2017   PROT 6.0 (L) 07/13/2020   PROT 5.7 (L) 04/30/2019   PROT 5.6 (L) 05/23/2017   ALBUMIN 3.1 (L) 07/13/2020   ALBUMIN 3.7 04/30/2019   ALBUMIN 2.5 (L) 05/23/2017   BILITOT 1.0 07/13/2020   BILITOT 0.4 07/09/2020   BILITOT 0.37 05/23/2017   ALKPHOS 47 07/13/2020   ALKPHOS 57 05/23/2017   AST 18 07/13/2020   AST 17 07/09/2020   AST 13 05/23/2017   ALT 16 07/13/2020   ALT 16 07/09/2020   ALT 15 05/23/2017  .   Total Discharge time is about 33 minutes  Roxan Hockey M.D on 07/18/2020 at 11:06 AM  Go to [email or web address] -  for contact info  [facility] - Office  [phone]

## 2020-07-24 DIAGNOSIS — E538 Deficiency of other specified B group vitamins: Secondary | ICD-10-CM | POA: Diagnosis not present

## 2020-07-24 DIAGNOSIS — Z6829 Body mass index (BMI) 29.0-29.9, adult: Secondary | ICD-10-CM | POA: Diagnosis not present

## 2020-07-24 DIAGNOSIS — J449 Chronic obstructive pulmonary disease, unspecified: Secondary | ICD-10-CM | POA: Diagnosis not present

## 2020-07-24 DIAGNOSIS — N390 Urinary tract infection, site not specified: Secondary | ICD-10-CM | POA: Diagnosis not present

## 2020-07-29 DIAGNOSIS — L501 Idiopathic urticaria: Secondary | ICD-10-CM | POA: Diagnosis not present

## 2020-07-30 ENCOUNTER — Other Ambulatory Visit: Payer: Self-pay

## 2020-07-30 ENCOUNTER — Ambulatory Visit (INDEPENDENT_AMBULATORY_CARE_PROVIDER_SITE_OTHER): Payer: Medicare Other

## 2020-07-30 DIAGNOSIS — L501 Idiopathic urticaria: Secondary | ICD-10-CM

## 2020-08-12 DIAGNOSIS — L501 Idiopathic urticaria: Secondary | ICD-10-CM | POA: Diagnosis not present

## 2020-08-13 ENCOUNTER — Other Ambulatory Visit: Payer: Self-pay

## 2020-08-13 ENCOUNTER — Ambulatory Visit (INDEPENDENT_AMBULATORY_CARE_PROVIDER_SITE_OTHER): Payer: Medicare Other

## 2020-08-13 DIAGNOSIS — J329 Chronic sinusitis, unspecified: Secondary | ICD-10-CM | POA: Diagnosis not present

## 2020-08-13 DIAGNOSIS — L501 Idiopathic urticaria: Secondary | ICD-10-CM

## 2020-08-13 DIAGNOSIS — Z681 Body mass index (BMI) 19 or less, adult: Secondary | ICD-10-CM | POA: Diagnosis not present

## 2020-08-13 DIAGNOSIS — J449 Chronic obstructive pulmonary disease, unspecified: Secondary | ICD-10-CM | POA: Diagnosis not present

## 2020-08-13 DIAGNOSIS — I10 Essential (primary) hypertension: Secondary | ICD-10-CM | POA: Diagnosis not present

## 2020-08-13 DIAGNOSIS — N390 Urinary tract infection, site not specified: Secondary | ICD-10-CM | POA: Diagnosis not present

## 2020-08-13 MED ORDER — OMALIZUMAB 150 MG ~~LOC~~ SOLR
300.0000 mg | SUBCUTANEOUS | Status: DC
Start: 2020-08-13 — End: 2021-04-01
  Administered 2020-08-13 – 2021-03-18 (×13): 300 mg via SUBCUTANEOUS

## 2020-08-15 DIAGNOSIS — G894 Chronic pain syndrome: Secondary | ICD-10-CM | POA: Diagnosis not present

## 2020-08-15 DIAGNOSIS — C679 Malignant neoplasm of bladder, unspecified: Secondary | ICD-10-CM | POA: Diagnosis not present

## 2020-08-15 DIAGNOSIS — C7951 Secondary malignant neoplasm of bone: Secondary | ICD-10-CM | POA: Diagnosis not present

## 2020-08-15 DIAGNOSIS — F4542 Pain disorder with related psychological factors: Secondary | ICD-10-CM | POA: Diagnosis not present

## 2020-08-19 DIAGNOSIS — Z9889 Other specified postprocedural states: Secondary | ICD-10-CM | POA: Diagnosis not present

## 2020-08-19 DIAGNOSIS — R102 Pelvic and perineal pain: Secondary | ICD-10-CM | POA: Diagnosis not present

## 2020-08-19 DIAGNOSIS — C7951 Secondary malignant neoplasm of bone: Secondary | ICD-10-CM | POA: Diagnosis not present

## 2020-08-19 DIAGNOSIS — C679 Malignant neoplasm of bladder, unspecified: Secondary | ICD-10-CM | POA: Diagnosis not present

## 2020-08-21 ENCOUNTER — Other Ambulatory Visit (HOSPITAL_COMMUNITY): Payer: Self-pay | Admitting: Family Medicine

## 2020-08-21 ENCOUNTER — Ambulatory Visit (HOSPITAL_COMMUNITY)
Admission: RE | Admit: 2020-08-21 | Discharge: 2020-08-21 | Disposition: A | Discharge status: Home / Self Care | Payer: Medicare Other | Source: Ambulatory Visit | Attending: Family Medicine | Admitting: Family Medicine

## 2020-08-21 ENCOUNTER — Other Ambulatory Visit: Payer: Self-pay

## 2020-08-21 ENCOUNTER — Other Ambulatory Visit: Payer: Medicare Other

## 2020-08-21 DIAGNOSIS — Z20822 Contact with and (suspected) exposure to covid-19: Secondary | ICD-10-CM | POA: Diagnosis not present

## 2020-08-21 DIAGNOSIS — J989 Respiratory disorder, unspecified: Secondary | ICD-10-CM | POA: Diagnosis not present

## 2020-08-21 DIAGNOSIS — J069 Acute upper respiratory infection, unspecified: Secondary | ICD-10-CM | POA: Diagnosis not present

## 2020-08-21 DIAGNOSIS — Z681 Body mass index (BMI) 19 or less, adult: Secondary | ICD-10-CM | POA: Diagnosis not present

## 2020-08-22 ENCOUNTER — Telehealth: Payer: Self-pay

## 2020-08-22 ENCOUNTER — Other Ambulatory Visit: Payer: Self-pay

## 2020-08-22 DIAGNOSIS — C679 Malignant neoplasm of bladder, unspecified: Secondary | ICD-10-CM

## 2020-08-22 MED ORDER — OXYCODONE HCL 5 MG PO TABS: 5.0000 mg | ORAL_TABLET | ORAL | 0 refills | Status: DC | PRN

## 2020-08-22 MED ORDER — HYDROMORPHONE HCL 4 MG PO TABS: 4.0000 mg | ORAL_TABLET | ORAL | 0 refills | Status: DC | PRN

## 2020-08-22 NOTE — Telephone Encounter (Signed)
Pt called left message to refill pain medication

## 2020-08-23 LAB — SARS-COV-2, NAA 2 DAY TAT

## 2020-08-23 LAB — NOVEL CORONAVIRUS, NAA: SARS-CoV-2, NAA: NOT DETECTED

## 2020-08-27 ENCOUNTER — Ambulatory Visit: Payer: Medicare Other

## 2020-08-28 DIAGNOSIS — L501 Idiopathic urticaria: Secondary | ICD-10-CM

## 2020-08-29 ENCOUNTER — Ambulatory Visit (INDEPENDENT_AMBULATORY_CARE_PROVIDER_SITE_OTHER): Payer: Medicare Other

## 2020-08-29 DIAGNOSIS — L501 Idiopathic urticaria: Secondary | ICD-10-CM | POA: Diagnosis not present

## 2020-09-11 DIAGNOSIS — L501 Idiopathic urticaria: Secondary | ICD-10-CM | POA: Diagnosis not present

## 2020-09-12 ENCOUNTER — Other Ambulatory Visit: Payer: Self-pay

## 2020-09-12 ENCOUNTER — Ambulatory Visit (INDEPENDENT_AMBULATORY_CARE_PROVIDER_SITE_OTHER): Payer: Medicare Other

## 2020-09-12 DIAGNOSIS — L501 Idiopathic urticaria: Secondary | ICD-10-CM | POA: Diagnosis not present

## 2020-09-25 DIAGNOSIS — L501 Idiopathic urticaria: Secondary | ICD-10-CM | POA: Diagnosis not present

## 2020-09-26 ENCOUNTER — Other Ambulatory Visit: Payer: Self-pay

## 2020-09-26 ENCOUNTER — Ambulatory Visit (INDEPENDENT_AMBULATORY_CARE_PROVIDER_SITE_OTHER): Payer: Medicare Other

## 2020-09-26 DIAGNOSIS — L501 Idiopathic urticaria: Secondary | ICD-10-CM | POA: Diagnosis not present

## 2020-09-26 DIAGNOSIS — H04123 Dry eye syndrome of bilateral lacrimal glands: Secondary | ICD-10-CM | POA: Diagnosis not present

## 2020-10-06 ENCOUNTER — Other Ambulatory Visit: Payer: Self-pay | Admitting: Oncology

## 2020-10-06 DIAGNOSIS — C679 Malignant neoplasm of bladder, unspecified: Secondary | ICD-10-CM

## 2020-10-06 MED ORDER — OXYCODONE HCL 5 MG PO TABS: 5.0000 mg | ORAL_TABLET | ORAL | 0 refills | Status: DC | PRN

## 2020-10-06 MED ORDER — HYDROMORPHONE HCL 4 MG PO TABS: 4.0000 mg | ORAL_TABLET | ORAL | 0 refills | Status: DC | PRN

## 2020-10-07 ENCOUNTER — Telehealth: Payer: Self-pay

## 2020-10-07 NOTE — Telephone Encounter (Signed)
Received call from pt. requesting to pick up oral contrast for his CT Chest from [place]. TCT pt. unable to reach and LVM explaining that he would not be needing an oral contrast for this but an IV contrast which would be administered during his visit.

## 2020-10-09 ENCOUNTER — Inpatient Hospital Stay: Payer: Medicare Other

## 2020-10-14 ENCOUNTER — Other Ambulatory Visit: Payer: Self-pay

## 2020-10-14 ENCOUNTER — Inpatient Hospital Stay: Payer: Medicare Other | Attending: Oncology

## 2020-10-14 ENCOUNTER — Ambulatory Visit (HOSPITAL_COMMUNITY)
Admission: RE | Admit: 2020-10-14 | Discharge: 2020-10-14 | Disposition: A | Discharge status: Home / Self Care | Payer: Medicare Other | Source: Ambulatory Visit | Attending: Oncology | Admitting: Oncology

## 2020-10-14 DIAGNOSIS — C679 Malignant neoplasm of bladder, unspecified: Secondary | ICD-10-CM | POA: Insufficient documentation

## 2020-10-14 DIAGNOSIS — M25559 Pain in unspecified hip: Secondary | ICD-10-CM | POA: Insufficient documentation

## 2020-10-14 DIAGNOSIS — C7951 Secondary malignant neoplasm of bone: Secondary | ICD-10-CM | POA: Insufficient documentation

## 2020-10-14 DIAGNOSIS — I2510 Atherosclerotic heart disease of native coronary artery without angina pectoris: Secondary | ICD-10-CM | POA: Diagnosis not present

## 2020-10-14 DIAGNOSIS — R590 Localized enlarged lymph nodes: Secondary | ICD-10-CM | POA: Insufficient documentation

## 2020-10-14 DIAGNOSIS — Z923 Personal history of irradiation: Secondary | ICD-10-CM | POA: Insufficient documentation

## 2020-10-14 DIAGNOSIS — Z9221 Personal history of antineoplastic chemotherapy: Secondary | ICD-10-CM | POA: Insufficient documentation

## 2020-10-14 DIAGNOSIS — J90 Pleural effusion, not elsewhere classified: Secondary | ICD-10-CM | POA: Diagnosis not present

## 2020-10-14 DIAGNOSIS — K449 Diaphragmatic hernia without obstruction or gangrene: Secondary | ICD-10-CM | POA: Diagnosis not present

## 2020-10-14 DIAGNOSIS — I700 Atherosclerosis of aorta: Secondary | ICD-10-CM | POA: Diagnosis not present

## 2020-10-14 DIAGNOSIS — C7800 Secondary malignant neoplasm of unspecified lung: Secondary | ICD-10-CM | POA: Insufficient documentation

## 2020-10-14 LAB — CBC WITH DIFFERENTIAL (CANCER CENTER ONLY)
Abs Immature Granulocytes: 0.03 10*3/uL (ref 0.00–0.07)
Basophils Absolute: 0.1 10*3/uL (ref 0.0–0.1)
Basophils Relative: 1 %
Eosinophils Absolute: 0.3 10*3/uL (ref 0.0–0.5)
Eosinophils Relative: 3 %
HCT: 42.8 % (ref 39.0–52.0)
Hemoglobin: 14 g/dL (ref 13.0–17.0)
Immature Granulocytes: 0 %
Lymphocytes Relative: 7 %
Lymphs Abs: 0.6 10*3/uL — ABNORMAL LOW (ref 0.7–4.0)
MCH: 35 pg — ABNORMAL HIGH (ref 26.0–34.0)
MCHC: 32.7 g/dL (ref 30.0–36.0)
MCV: 107 fL — ABNORMAL HIGH (ref 80.0–100.0)
Monocytes Absolute: 1 10*3/uL (ref 0.1–1.0)
Monocytes Relative: 12 %
Neutro Abs: 6.9 10*3/uL (ref 1.7–7.7)
Neutrophils Relative %: 77 %
Platelet Count: 236 10*3/uL (ref 150–400)
RBC: 4 MIL/uL — ABNORMAL LOW (ref 4.22–5.81)
RDW: 13.1 % (ref 11.5–15.5)
WBC Count: 8.9 10*3/uL (ref 4.0–10.5)
nRBC: 0 % (ref 0.0–0.2)

## 2020-10-14 LAB — CMP (CANCER CENTER ONLY)
ALT: 11 U/L (ref 0–44)
AST: 14 U/L — ABNORMAL LOW (ref 15–41)
Albumin: 2.9 g/dL — ABNORMAL LOW (ref 3.5–5.0)
Alkaline Phosphatase: 68 U/L (ref 38–126)
Anion gap: 8 (ref 5–15)
BUN: 9 mg/dL (ref 8–23)
CO2: 25 mmol/L (ref 22–32)
Calcium: 8.6 mg/dL — ABNORMAL LOW (ref 8.9–10.3)
Chloride: 107 mmol/L (ref 98–111)
Creatinine: 1.02 mg/dL (ref 0.61–1.24)
GFR, Estimated: >60 mL/min (ref >60)
Glucose, Bld: 98 mg/dL (ref 70–99)
Potassium: 4.7 mmol/L (ref 3.5–5.1)
Sodium: 140 mmol/L (ref 135–145)
Total Bilirubin: 0.4 mg/dL (ref 0.3–1.2)
Total Protein: 5.7 g/dL — ABNORMAL LOW (ref 6.5–8.1)

## 2020-10-14 MED ORDER — IOHEXOL 300 MG/ML  SOLN
75.0000 mL | Freq: Once | INTRAMUSCULAR | Status: AC | PRN
  Administered 2020-10-14: 75 mL via INTRAVENOUS

## 2020-10-16 ENCOUNTER — Inpatient Hospital Stay (HOSPITAL_BASED_OUTPATIENT_CLINIC_OR_DEPARTMENT_OTHER): Payer: Medicare Other | Admitting: Oncology

## 2020-10-16 ENCOUNTER — Other Ambulatory Visit: Payer: Self-pay

## 2020-10-16 VITALS — BP 167/87 | HR 88 | Temp 97.5°F | Resp 18 | Ht 71.0 in | Wt 208.5 lb

## 2020-10-16 DIAGNOSIS — C7951 Secondary malignant neoplasm of bone: Secondary | ICD-10-CM | POA: Diagnosis not present

## 2020-10-16 DIAGNOSIS — C679 Malignant neoplasm of bladder, unspecified: Secondary | ICD-10-CM | POA: Diagnosis not present

## 2020-10-16 DIAGNOSIS — Z9221 Personal history of antineoplastic chemotherapy: Secondary | ICD-10-CM | POA: Diagnosis not present

## 2020-10-16 DIAGNOSIS — L501 Idiopathic urticaria: Secondary | ICD-10-CM | POA: Diagnosis not present

## 2020-10-16 DIAGNOSIS — R590 Localized enlarged lymph nodes: Secondary | ICD-10-CM | POA: Diagnosis not present

## 2020-10-16 DIAGNOSIS — Z923 Personal history of irradiation: Secondary | ICD-10-CM | POA: Diagnosis not present

## 2020-10-16 DIAGNOSIS — C7800 Secondary malignant neoplasm of unspecified lung: Secondary | ICD-10-CM | POA: Diagnosis not present

## 2020-10-16 DIAGNOSIS — M25559 Pain in unspecified hip: Secondary | ICD-10-CM | POA: Diagnosis not present

## 2020-10-16 NOTE — Progress Notes (Signed)
Hematology and Oncology Follow Up   [name] [id number] 1941-02-19 80 y.o. 10/16/2020 10:08 AM [name], MDFusco, Purcell Nails, MD       Principle Diagnosis: 80 year old man with bladder cancer diagnosed 03/24/2017.  He developed stage IV high-grade urothelial carcinoma with lung and bone involvement documented in 2019.  His tumor PDL 1 positive  with CPS score over 90% of that time.   Prior Therapy:   He is status post neoadjuvant chemotherapy utilizing gemcitabine and cisplatin started in September 2018.  He received day 1 of cycle 1 on 05/02/2017 and therapy discontinued after that.  He is status post laparoscopic Cystoprostatectomy and bilateral lymphadenectomy done by Dr. Tresa Moore on 06/29/2017. Final pathology showed a T3bN0 disease.  He has 13 lymph nodes sampled without any evidence of malignancy.  He completed radiation therapy to the pelvis on January 16, 2018 under the care of Dr. Tammi Klippel.   Carboplatin and gemcitabine he is status post 6 cycles of therapy completed on 05/22/2018.  Pembrolizumab 200 mg every 3 weeks started on 06/21/2018.  He completed 8 cycles of therapy and April 2020.  Therapy discontinued because of dermatological toxicities.  He is status post radiation therapy to the right paratracheal lymph node for a total of 50 Gray in 10 fractions completed on April 23, 2020.   Current therapy:  Active surveillance.   Interim History: [name] returns today for a follow-up visit.  Since the last visit, he was hospitalized briefly up till July 18, 2020 for urinary tract infection.  Since his discharge and infection he reports feeling reasonably well without any complaints.  He denies any frequency urgency or hesitancy.  Denies any dysuria.  He is active and ambulating without any major difficulties.  He continues to use pain medication periodically for his hip.  He is still able to perform most activities of daily living most days.   Medications:  Reviewed without changes. Current Outpatient Medications  Medication Sig Dispense Refill  . acetaminophen (TYLENOL) 325 MG tablet Take 2 tablets (650 mg total) by mouth every 6 (six) hours as needed for mild pain, fever or headache (or Fever >/= 101). 12 tablet 0  . clotrimazole-betamethasone (LOTRISONE) cream Apply 1 application topically in the morning and at bedtime.    . cyanocobalamin (,VITAMIN B-12,) 1000 MCG/ML injection Inject 1 mL (1,000 mcg total) into the muscle every 30 (thirty) days. 6 mL 1  . HYDROmorphone (DILAUDID) 4 MG tablet Take 1 tablet (4 mg total) by mouth every 4 (four) hours as needed for severe pain. 120 tablet 0  . lactose free nutrition (BOOST) LIQD Take 237 mLs by mouth 3 (three) times daily between meals.     Marland Kitchen oxyCODONE (OXY IR/ROXICODONE) 5 MG immediate release tablet Take 1-2 tablets (5-10 mg total) by mouth every 4 (four) hours as needed for severe pain. 120 tablet 0  . polyethylene glycol (MIRALAX / GLYCOLAX) 17 g packet Take 17 g by mouth daily as needed for mild constipation. 14 each 0   Current Facility-Administered Medications  Medication Dose Route Frequency Provider Last Rate Last Admin  . omalizumab Arvid Right) injection 300 mg  300 mg Subcutaneous Q14 Days Valentina Shaggy, MD   300 mg at 09/26/20 1140     Allergies:  Allergies  Allergen Reactions  . Ciprofloxacin Hives  . Cefepime Rash   Physical exam:    ECOG 1  Blood pressure (!) 167/87, pulse 88, temperature (!) 97.5 F (36.4 C), temperature source Temporal, resp. rate 18,  height 5\' 11"  (1.803 m), weight 208 lb 8 oz (94.6 kg), SpO2 98 %.    General appearance: Alert, awake without any distress. Head: Atraumatic without abnormalities Oropharynx: Without any thrush or ulcers. Eyes: No scleral icterus. Lymph nodes: No lymphadenopathy noted in the cervical, supraclavicular, or axillary nodes Heart:regular rate and rhythm, without any murmurs or gallops.   Lung: Clear to auscultation  without any rhonchi, wheezes or dullness to percussion. Abdomin: Soft, nontender without any shifting dullness or ascites. Musculoskeletal: No clubbing or cyanosis. Neurological: No motor or sensory deficits. Skin: No rashes or lesions.      Lab Results: Lab Results  Component Value Date   WBC 8.9 10/14/2020   HGB 14.0 10/14/2020   HCT 42.8 10/14/2020   MCV 107.0 (H) 10/14/2020   PLT 236 10/14/2020     Chemistry      Component Value Date/Time   NA 140 10/14/2020 1415   NA 140 04/30/2019 1033   NA 141 05/23/2017 0828   K 4.7 10/14/2020 1415   K 4.0 05/23/2017 0828   CL 107 10/14/2020 1415   CO2 25 10/14/2020 1415   CO2 27 05/23/2017 0828   BUN 9 10/14/2020 1415   BUN 16 04/30/2019 1033   BUN 12.5 05/23/2017 0828   CREATININE 1.02 10/14/2020 1415   CREATININE 0.8 05/23/2017 0828      Component Value Date/Time   CALCIUM 8.6 (L) 10/14/2020 1415   CALCIUM 9.0 05/23/2017 0828   ALKPHOS 68 10/14/2020 1415   ALKPHOS 57 05/23/2017 0828   AST 14 (L) 10/14/2020 1415   AST 13 05/23/2017 0828   ALT 11 10/14/2020 1415   ALT 15 05/23/2017 0828   BILITOT 0.4 10/14/2020 1415   BILITOT 0.37 05/23/2017 0828     IMPRESSION: 1. Interval resolution of previous right paratracheal and superior mediastinal adenopathy. 2. New bandlike area of masslike architectural distortion within the right upper lobe is identified. The bandlike configuration suggests this represents changes secondary to external beam radiation. However, if the this area does not correspond to the radiation port than findings may reflect a new site of disease. Additionally, there is a new sub solid nodule within the perihilar right upper lobe measuring 1.5 x 1.2 cm. This may also reflect changes secondary to external beam radiation. Consider short-term interval follow-up with repeat CT of the chest or PET-CT in 3 months. 3. Mild increase in size of left upper lobe spiculated lung nodule. 4. New small bilateral  pleural effusions, right greater than left. 5. Coronary artery atherosclerotic calcifications. 6. Small hiatal hernia.   Impression and Plan:  80 year old man with:   1.    Stage IV high-grade urothelial carcinoma of the bladder cancer diagnosed in 2019.  He status post multiple therapies outlined above  The natural course of his disease was reviewed at this time.  CT scan obtained on October 14, 2020 was personally reviewed and discussed with the patient.  He has robust response to radiation therapy to the enlarged paratracheal and mediastinal adenopathy.  He does have other areas of question that could represent disease progression however this could be radiation related.  Treatment options moving forward were discussed.  I recommended a period of observation repeat imaging studies in 4 months.  Different salvage therapy options including retreatment with Pembrolizumab versus Padcev versus FGFR inhibitors if he harbors the appropriate mutation.  Given his low volume and limited disease if any at this time I recommended period of observation.   2. Hip pain:  No evidence of progression at this time.  Pain is manageable with the current regimen of Dilaudid and occasional oxycodone.    3.  Goals of care: His disease is incurable although aggressive measures are warranted given his reasonable performance status.   4.    Recurrent UTI: He is status post recent hospitalization and has recovered at this time.  5.  Follow-up:  In 4 months for repeat imaging studies.  30  minutes were dedicated to this visit.  The time was spent on reviewing disease status, discussing treatment options and future plan of care review.   Zola Button, MD 10/16/2020 10:08 AM

## 2020-10-17 ENCOUNTER — Ambulatory Visit (INDEPENDENT_AMBULATORY_CARE_PROVIDER_SITE_OTHER): Payer: Medicare Other

## 2020-10-17 DIAGNOSIS — L501 Idiopathic urticaria: Secondary | ICD-10-CM

## 2020-10-30 DIAGNOSIS — C7951 Secondary malignant neoplasm of bone: Secondary | ICD-10-CM | POA: Diagnosis not present

## 2020-10-30 DIAGNOSIS — Z936 Other artificial openings of urinary tract status: Secondary | ICD-10-CM | POA: Diagnosis not present

## 2020-10-30 DIAGNOSIS — Z1331 Encounter for screening for depression: Secondary | ICD-10-CM | POA: Diagnosis not present

## 2020-10-30 DIAGNOSIS — C7800 Secondary malignant neoplasm of unspecified lung: Secondary | ICD-10-CM | POA: Diagnosis not present

## 2020-10-30 DIAGNOSIS — Z6830 Body mass index (BMI) 30.0-30.9, adult: Secondary | ICD-10-CM | POA: Diagnosis not present

## 2020-10-30 DIAGNOSIS — N390 Urinary tract infection, site not specified: Secondary | ICD-10-CM | POA: Diagnosis not present

## 2020-10-30 DIAGNOSIS — J449 Chronic obstructive pulmonary disease, unspecified: Secondary | ICD-10-CM | POA: Diagnosis not present

## 2020-10-30 DIAGNOSIS — L209 Atopic dermatitis, unspecified: Secondary | ICD-10-CM | POA: Diagnosis not present

## 2020-10-30 DIAGNOSIS — L501 Idiopathic urticaria: Secondary | ICD-10-CM | POA: Diagnosis not present

## 2020-10-31 ENCOUNTER — Other Ambulatory Visit: Payer: Self-pay

## 2020-10-31 ENCOUNTER — Ambulatory Visit (INDEPENDENT_AMBULATORY_CARE_PROVIDER_SITE_OTHER): Payer: Medicare Other

## 2020-10-31 DIAGNOSIS — L501 Idiopathic urticaria: Secondary | ICD-10-CM | POA: Diagnosis not present

## 2020-11-12 DIAGNOSIS — I10 Essential (primary) hypertension: Secondary | ICD-10-CM | POA: Diagnosis not present

## 2020-11-12 DIAGNOSIS — F4542 Pain disorder with related psychological factors: Secondary | ICD-10-CM | POA: Diagnosis not present

## 2020-11-12 DIAGNOSIS — C679 Malignant neoplasm of bladder, unspecified: Secondary | ICD-10-CM | POA: Diagnosis not present

## 2020-11-12 DIAGNOSIS — G894 Chronic pain syndrome: Secondary | ICD-10-CM | POA: Diagnosis not present

## 2020-11-12 DIAGNOSIS — C7951 Secondary malignant neoplasm of bone: Secondary | ICD-10-CM | POA: Diagnosis not present

## 2020-11-13 DIAGNOSIS — R319 Hematuria, unspecified: Secondary | ICD-10-CM | POA: Diagnosis not present

## 2020-11-18 DIAGNOSIS — L501 Idiopathic urticaria: Secondary | ICD-10-CM | POA: Diagnosis not present

## 2020-11-19 ENCOUNTER — Ambulatory Visit (INDEPENDENT_AMBULATORY_CARE_PROVIDER_SITE_OTHER): Payer: Medicare Other

## 2020-11-19 ENCOUNTER — Other Ambulatory Visit: Payer: Self-pay

## 2020-11-19 DIAGNOSIS — L501 Idiopathic urticaria: Secondary | ICD-10-CM | POA: Diagnosis not present

## 2020-11-21 ENCOUNTER — Other Ambulatory Visit: Payer: Self-pay | Admitting: Oncology

## 2020-11-21 DIAGNOSIS — C679 Malignant neoplasm of bladder, unspecified: Secondary | ICD-10-CM

## 2020-11-21 MED ORDER — HYDROMORPHONE HCL 4 MG PO TABS: 4.0000 mg | ORAL_TABLET | ORAL | 0 refills | Status: DC | PRN

## 2020-11-21 MED ORDER — OXYCODONE HCL 5 MG PO TABS: 5.0000 mg | ORAL_TABLET | ORAL | 0 refills | Status: DC | PRN

## 2020-12-02 DIAGNOSIS — Z6830 Body mass index (BMI) 30.0-30.9, adult: Secondary | ICD-10-CM | POA: Diagnosis not present

## 2020-12-02 DIAGNOSIS — N342 Other urethritis: Secondary | ICD-10-CM | POA: Diagnosis not present

## 2020-12-02 DIAGNOSIS — Z1331 Encounter for screening for depression: Secondary | ICD-10-CM | POA: Diagnosis not present

## 2020-12-02 DIAGNOSIS — E6609 Other obesity due to excess calories: Secondary | ICD-10-CM | POA: Diagnosis not present

## 2020-12-03 ENCOUNTER — Ambulatory Visit: Payer: Self-pay

## 2020-12-09 DIAGNOSIS — N390 Urinary tract infection, site not specified: Secondary | ICD-10-CM | POA: Diagnosis not present

## 2020-12-09 DIAGNOSIS — C679 Malignant neoplasm of bladder, unspecified: Secondary | ICD-10-CM | POA: Diagnosis not present

## 2020-12-09 DIAGNOSIS — Z6829 Body mass index (BMI) 29.0-29.9, adult: Secondary | ICD-10-CM | POA: Diagnosis not present

## 2020-12-09 DIAGNOSIS — R5383 Other fatigue: Secondary | ICD-10-CM | POA: Diagnosis not present

## 2020-12-09 DIAGNOSIS — Z125 Encounter for screening for malignant neoplasm of prostate: Secondary | ICD-10-CM | POA: Diagnosis not present

## 2020-12-11 DIAGNOSIS — L501 Idiopathic urticaria: Secondary | ICD-10-CM | POA: Diagnosis not present

## 2020-12-12 ENCOUNTER — Ambulatory Visit (INDEPENDENT_AMBULATORY_CARE_PROVIDER_SITE_OTHER): Payer: Medicare Other

## 2020-12-12 ENCOUNTER — Other Ambulatory Visit: Payer: Self-pay

## 2020-12-12 DIAGNOSIS — L501 Idiopathic urticaria: Secondary | ICD-10-CM | POA: Diagnosis not present

## 2020-12-13 DIAGNOSIS — C679 Malignant neoplasm of bladder, unspecified: Secondary | ICD-10-CM | POA: Diagnosis not present

## 2020-12-13 DIAGNOSIS — I10 Essential (primary) hypertension: Secondary | ICD-10-CM | POA: Diagnosis not present

## 2020-12-13 DIAGNOSIS — C7951 Secondary malignant neoplasm of bone: Secondary | ICD-10-CM | POA: Diagnosis not present

## 2020-12-13 DIAGNOSIS — F4542 Pain disorder with related psychological factors: Secondary | ICD-10-CM | POA: Diagnosis not present

## 2020-12-13 DIAGNOSIS — G894 Chronic pain syndrome: Secondary | ICD-10-CM | POA: Diagnosis not present

## 2020-12-18 ENCOUNTER — Telehealth: Payer: Self-pay | Admitting: *Deleted

## 2020-12-18 DIAGNOSIS — I10 Essential (primary) hypertension: Secondary | ICD-10-CM | POA: Diagnosis not present

## 2020-12-18 DIAGNOSIS — Z6828 Body mass index (BMI) 28.0-28.9, adult: Secondary | ICD-10-CM | POA: Diagnosis not present

## 2020-12-18 DIAGNOSIS — J449 Chronic obstructive pulmonary disease, unspecified: Secondary | ICD-10-CM | POA: Diagnosis not present

## 2020-12-18 DIAGNOSIS — R5383 Other fatigue: Secondary | ICD-10-CM | POA: Diagnosis not present

## 2020-12-18 DIAGNOSIS — C679 Malignant neoplasm of bladder, unspecified: Secondary | ICD-10-CM | POA: Diagnosis not present

## 2020-12-18 NOTE — Telephone Encounter (Signed)
-----   Message from [name], MD sent at 12/18/2020  9:29 AM EDT ----- Regarding: RE: Patient requesting appointment He needs to see his PCP regarding this issue.  ----- Message ----- From: Rolene Course, RN Sent: 12/18/2020   9:23 AM EDT To: [name], MD Subject: Patient requesting appointment                 Received a phone call from this patient, he was dx'd with a UTI in April.  He is on his second antibiotic, sulfamethaxazole, since April 10.  He states he is still weak, tired & not feeling well at all.  He thinks he needs a different antibiotic & is requesting to see you.  Please advise, thanks, Bethena Roys

## 2020-12-18 NOTE — Telephone Encounter (Signed)
PC to patient, informed him Dr. Alen Blew recommends that he follow up with PCP about his issues since they have been prescribing his antibiotics.  Patient verbalizes understanding, informed him to call our office [phone] with any further questions/concerns.

## 2020-12-25 DIAGNOSIS — E538 Deficiency of other specified B group vitamins: Secondary | ICD-10-CM | POA: Diagnosis not present

## 2020-12-25 DIAGNOSIS — L501 Idiopathic urticaria: Secondary | ICD-10-CM | POA: Diagnosis not present

## 2020-12-25 DIAGNOSIS — R319 Hematuria, unspecified: Secondary | ICD-10-CM | POA: Diagnosis not present

## 2020-12-25 DIAGNOSIS — R5383 Other fatigue: Secondary | ICD-10-CM | POA: Diagnosis not present

## 2020-12-25 DIAGNOSIS — E663 Overweight: Secondary | ICD-10-CM | POA: Diagnosis not present

## 2020-12-25 DIAGNOSIS — Z6828 Body mass index (BMI) 28.0-28.9, adult: Secondary | ICD-10-CM | POA: Diagnosis not present

## 2020-12-25 DIAGNOSIS — C679 Malignant neoplasm of bladder, unspecified: Secondary | ICD-10-CM | POA: Diagnosis not present

## 2020-12-25 DIAGNOSIS — C7951 Secondary malignant neoplasm of bone: Secondary | ICD-10-CM | POA: Diagnosis not present

## 2020-12-25 DIAGNOSIS — E039 Hypothyroidism, unspecified: Secondary | ICD-10-CM | POA: Diagnosis not present

## 2020-12-26 ENCOUNTER — Other Ambulatory Visit: Payer: Self-pay

## 2020-12-26 ENCOUNTER — Ambulatory Visit (INDEPENDENT_AMBULATORY_CARE_PROVIDER_SITE_OTHER): Payer: Medicare Other

## 2020-12-26 DIAGNOSIS — L501 Idiopathic urticaria: Secondary | ICD-10-CM

## 2020-12-30 ENCOUNTER — Telehealth: Payer: Self-pay | Admitting: *Deleted

## 2020-12-30 NOTE — Telephone Encounter (Signed)
Patient's Daughter Lattie Haw) called: Patient not doing well. Sleeping all the time. Stays in bed a lot. Very low energy. Weak. Color pale/gray. Problems with memory. UTI now cleared. PCP giving B12 shots. Wife afraid to leave him alone due to his weakness. Family wants to know if Dr. Alen Blew would see him for an evaluation. Dr. Alen Blew informed. Per Dr. Alen Blew - he will send a message to scheduling to request appt for patient. Dr. Alen Blew said to tell family if patient has further decline, they should take him to the ED.  Contacted Dtr with information as per Dr. Alen Blew. She verbalized understanding.

## 2021-01-01 ENCOUNTER — Telehealth: Payer: Self-pay | Admitting: *Deleted

## 2021-01-01 NOTE — Telephone Encounter (Signed)
Returned call to patient's daughter Lattie Haw, informed her scheduling has been contacted to set up appointment for patient to see Dr. Alen Blew. She spoke with RN at this office on 12/30/20 and no one has contacted her.  She states patient's status has changed drastically in the last two weeks, he is very tired, lethargic, is sleeping all the time.  His color is very bad.  Instructed her to take patient to ED if condition worsens, she verbalizes understanding.  Scheduling is to contact them with appointment time.

## 2021-01-02 ENCOUNTER — Telehealth: Payer: Self-pay | Admitting: Oncology

## 2021-01-02 NOTE — Telephone Encounter (Signed)
Per 5/19 los, pt aware

## 2021-01-05 ENCOUNTER — Inpatient Hospital Stay: Payer: Medicare Other | Attending: Oncology

## 2021-01-05 ENCOUNTER — Other Ambulatory Visit: Payer: Self-pay

## 2021-01-05 ENCOUNTER — Inpatient Hospital Stay (HOSPITAL_BASED_OUTPATIENT_CLINIC_OR_DEPARTMENT_OTHER): Payer: Medicare Other | Admitting: Oncology

## 2021-01-05 VITALS — HR 95 | Temp 97.7°F | Resp 18 | Ht 71.0 in | Wt 206.7 lb

## 2021-01-05 DIAGNOSIS — R531 Weakness: Secondary | ICD-10-CM | POA: Diagnosis not present

## 2021-01-05 DIAGNOSIS — C679 Malignant neoplasm of bladder, unspecified: Secondary | ICD-10-CM

## 2021-01-05 DIAGNOSIS — Z923 Personal history of irradiation: Secondary | ICD-10-CM | POA: Insufficient documentation

## 2021-01-05 DIAGNOSIS — R5383 Other fatigue: Secondary | ICD-10-CM | POA: Diagnosis not present

## 2021-01-05 DIAGNOSIS — Z9221 Personal history of antineoplastic chemotherapy: Secondary | ICD-10-CM | POA: Insufficient documentation

## 2021-01-05 DIAGNOSIS — M25559 Pain in unspecified hip: Secondary | ICD-10-CM | POA: Insufficient documentation

## 2021-01-05 LAB — CMP (CANCER CENTER ONLY)
ALT: 26 U/L (ref 0–44)
AST: 16 U/L (ref 15–41)
Albumin: 2.9 g/dL — ABNORMAL LOW (ref 3.5–5.0)
Alkaline Phosphatase: 62 U/L (ref 38–126)
Anion gap: 8 (ref 5–15)
BUN: 12 mg/dL (ref 8–23)
CO2: 29 mmol/L (ref 22–32)
Calcium: 9.3 mg/dL (ref 8.9–10.3)
Chloride: 105 mmol/L (ref 98–111)
Creatinine: 0.91 mg/dL (ref 0.61–1.24)
GFR, Estimated: >60 mL/min (ref >60)
Glucose, Bld: 128 mg/dL — ABNORMAL HIGH (ref 70–99)
Potassium: 4 mmol/L (ref 3.5–5.1)
Sodium: 142 mmol/L (ref 135–145)
Total Bilirubin: 0.5 mg/dL (ref 0.3–1.2)
Total Protein: 5.8 g/dL — ABNORMAL LOW (ref 6.5–8.1)

## 2021-01-05 LAB — CBC WITH DIFFERENTIAL (CANCER CENTER ONLY)
Abs Immature Granulocytes: 0.08 10*3/uL — ABNORMAL HIGH (ref 0.00–0.07)
Basophils Absolute: 0.1 10*3/uL (ref 0.0–0.1)
Basophils Relative: 1 %
Eosinophils Absolute: 0.3 10*3/uL (ref 0.0–0.5)
Eosinophils Relative: 3 %
HCT: 44 % (ref 39.0–52.0)
Hemoglobin: 14.5 g/dL (ref 13.0–17.0)
Immature Granulocytes: 1 %
Lymphocytes Relative: 5 %
Lymphs Abs: 0.6 10*3/uL — ABNORMAL LOW (ref 0.7–4.0)
MCH: 35 pg — ABNORMAL HIGH (ref 26.0–34.0)
MCHC: 33 g/dL (ref 30.0–36.0)
MCV: 106.3 fL — ABNORMAL HIGH (ref 80.0–100.0)
Monocytes Absolute: 1.2 10*3/uL — ABNORMAL HIGH (ref 0.1–1.0)
Monocytes Relative: 11 %
Neutro Abs: 8.9 10*3/uL — ABNORMAL HIGH (ref 1.7–7.7)
Neutrophils Relative %: 79 %
Platelet Count: 183 10*3/uL (ref 150–400)
RBC: 4.14 MIL/uL — ABNORMAL LOW (ref 4.22–5.81)
RDW: 13.3 % (ref 11.5–15.5)
WBC Count: 11.1 10*3/uL — ABNORMAL HIGH (ref 4.0–10.5)
nRBC: 0 % (ref 0.0–0.2)

## 2021-01-05 NOTE — Progress Notes (Signed)
Hematology and Oncology Follow Up   [name] [id number] April 17, 1941 80 y.o. 01/05/2021 10:13 AM [name], MDFusco, Purcell Nails, MD       Principle Diagnosis: 80 year old man with stage IV bladder cancer with pulmonary and bone involvement confirmed in 2019.  He was found to have high-grade urothelial carcinoma, PDL 1 positive  with CPS score over 90% with localized disease in 2018.  Prior Therapy:   He is status post neoadjuvant chemotherapy utilizing gemcitabine and cisplatin started in September 2018.  He received day 1 of cycle 1 on 05/02/2017 and therapy discontinued after that.  He is status post laparoscopic Cystoprostatectomy and bilateral lymphadenectomy done by Dr. Tresa Moore on 06/29/2017. Final pathology showed a T3bN0 disease.  He has 13 lymph nodes sampled without any evidence of malignancy.  He completed radiation therapy to the pelvis on January 16, 2018 under the care of Dr. Tammi Klippel.   Carboplatin and gemcitabine he is status post 6 cycles of therapy completed on 05/22/2018.  Pembrolizumab 200 mg every 3 weeks started on 06/21/2018.  He completed 8 cycles of therapy and April 2020.  Therapy discontinued because of dermatological toxicities.  He is status post radiation therapy to the right paratracheal lymph node for a total of 50 Gray in 10 fractions completed on April 23, 2020.   Current therapy:  Active surveillance.   Interim History: [name] is here for return evaluation.  Since last visit, he reports some episodic weakness and fatigue that has been noted by his family more so the last 2 months.  He denies any specific symptoms at this time.  He denies any fevers, chills or sweats.  Is eating reasonably well and weight is maintained.  His exercise tolerance has decreased however.  He denies any cough wheezing shortness of breath.  Denies any abdominal pain diarrhea.  He denies any recent falls or syncope.  His family is concerned about his memory and  mental status at times.   Medications: Updated after review. Current Outpatient Medications  Medication Sig Dispense Refill  . acetaminophen (TYLENOL) 325 MG tablet Take 2 tablets (650 mg total) by mouth every 6 (six) hours as needed for mild pain, fever or headache (or Fever >/= 101). 12 tablet 0  . clotrimazole-betamethasone (LOTRISONE) cream Apply 1 application topically in the morning and at bedtime.    . cyanocobalamin (,VITAMIN B-12,) 1000 MCG/ML injection Inject 1 mL (1,000 mcg total) into the muscle every 30 (thirty) days. 6 mL 1  . HYDROmorphone (DILAUDID) 4 MG tablet Take 1 tablet (4 mg total) by mouth every 4 (four) hours as needed for severe pain. 120 tablet 0  . lactose free nutrition (BOOST) LIQD Take 237 mLs by mouth 3 (three) times daily between meals.     Marland Kitchen linezolid (ZYVOX) 600 MG tablet TAKE 1 TABLET (600 MG TOTAL) BY MOUTH EVERY 12 (TWELVE) HOURS FOR 7 DAYS. 14 tablet 0  . oxyCODONE (OXY IR/ROXICODONE) 5 MG immediate release tablet Take 1-2 tablets (5-10 mg total) by mouth every 4 (four) hours as needed for severe pain. 120 tablet 0  . polyethylene glycol (MIRALAX / GLYCOLAX) 17 g packet Take 17 g by mouth daily as needed for mild constipation. 14 each 0   Current Facility-Administered Medications  Medication Dose Route Frequency Provider Last Rate Last Admin  . omalizumab Arvid Right) injection 300 mg  300 mg Subcutaneous Q14 Days Valentina Shaggy, MD   300 mg at 12/26/20 2426     Allergies:  Allergies  Allergen  Reactions  . Ciprofloxacin Hives  . Cefepime Rash   Physical exam:  Pulse 95, temperature 97.7 F (36.5 C), temperature source Tympanic, resp. rate 18, height 5\' 11"  (1.803 m), weight 206 lb 11.2 oz (93.8 kg), SpO2 95 %.    ECOG 1     General appearance: Comfortable appearing without any discomfort Head: Normocephalic without any trauma Oropharynx: Mucous membranes are moist and pink without any thrush or ulcers. Eyes: Pupils are equal and round  reactive to light. Lymph nodes: No cervical, supraclavicular, inguinal or axillary lymphadenopathy.   Heart:regular rate and rhythm.  S1 and S2 without leg edema. Lung: Clear without any rhonchi or wheezes.  No dullness to percussion. Abdomin: Soft, nontender, nondistended with good bowel sounds.  No hepatosplenomegaly. Musculoskeletal: No joint deformity or effusion.  Full range of motion noted. Neurological: No deficits noted on motor, sensory and deep tendon reflex exam. Skin: No petechial rash or dryness.  Appeared moist.         Lab Results: Lab Results  Component Value Date   WBC 11.1 (H) 01/05/2021   HGB 14.5 01/05/2021   HCT 44.0 01/05/2021   MCV 106.3 (H) 01/05/2021   PLT 183 01/05/2021     Chemistry      Component Value Date/Time   NA 140 10/14/2020 1415   NA 140 04/30/2019 1033   NA 141 05/23/2017 0828   K 4.7 10/14/2020 1415   K 4.0 05/23/2017 0828   CL 107 10/14/2020 1415   CO2 25 10/14/2020 1415   CO2 27 05/23/2017 0828   BUN 9 10/14/2020 1415   BUN 16 04/30/2019 1033   BUN 12.5 05/23/2017 0828   CREATININE 1.02 10/14/2020 1415   CREATININE 0.8 05/23/2017 0828      Component Value Date/Time   CALCIUM 8.6 (L) 10/14/2020 1415   CALCIUM 9.0 05/23/2017 0828   ALKPHOS 68 10/14/2020 1415   ALKPHOS 57 05/23/2017 0828   AST 14 (L) 10/14/2020 1415   AST 13 05/23/2017 0828   ALT 11 10/14/2020 1415   ALT 15 05/23/2017 0828   BILITOT 0.4 10/14/2020 1415   BILITOT 0.37 05/23/2017 0828       Impression and Plan:  80 year old man with:   1.    Bladder cancer diagnosed in 2018 with stage IV disease documented in 2019 with high-grade urothelial carcinoma.   His disease status was updated at this time and treatment options were reviewed.  He is providing history of a vague complaints at this time but could be a sign of disease progression.  I recommended updating his staging scans including CT scan chest abdomen and pelvis with MRI of the brain to ensure no  evidence of metastatic disease.  Treatment options if he has disease progression including retreatment with immunotherapy versus antibiotic drug conjugate like Padcev.    2. Hip pain:  Manageable at this time with the current pain medication which will be refilled for him.    3.  Goals of care: His disease is incurable although aggressive measures are warranted given his reasonable performance status.   4.    Constitutional symptoms of fatigue and weakness: Unclear etiology but could be related to malignancy which will evaluate in the near future.  Laboratory data today reviewed showed a normal hemoglobin.   5.  Follow-up:  Will be in the next few weeks after completing staging scans.  30  minutes were spent on this encounter.  The time was dedicated to reviewing his disease status, laboratory data  review and future plan of care treatments.Zola Button, MD 01/05/2021 10:13 AM

## 2021-01-07 ENCOUNTER — Other Ambulatory Visit: Payer: Self-pay | Admitting: *Deleted

## 2021-01-07 ENCOUNTER — Other Ambulatory Visit: Payer: Self-pay | Admitting: Oncology

## 2021-01-07 DIAGNOSIS — C679 Malignant neoplasm of bladder, unspecified: Secondary | ICD-10-CM

## 2021-01-07 DIAGNOSIS — C7951 Secondary malignant neoplasm of bone: Secondary | ICD-10-CM

## 2021-01-07 MED ORDER — OXYCODONE HCL 5 MG PO TABS: 5.0000 mg | ORAL_TABLET | ORAL | 0 refills | Status: DC | PRN

## 2021-01-07 MED ORDER — HYDROMORPHONE HCL 4 MG PO TABS: 4.0000 mg | ORAL_TABLET | ORAL | 0 refills | Status: DC | PRN

## 2021-01-08 DIAGNOSIS — L501 Idiopathic urticaria: Secondary | ICD-10-CM | POA: Diagnosis not present

## 2021-01-09 ENCOUNTER — Ambulatory Visit (INDEPENDENT_AMBULATORY_CARE_PROVIDER_SITE_OTHER): Payer: Medicare Other

## 2021-01-09 ENCOUNTER — Other Ambulatory Visit: Payer: Self-pay

## 2021-01-09 DIAGNOSIS — L501 Idiopathic urticaria: Secondary | ICD-10-CM | POA: Diagnosis not present

## 2021-01-13 DIAGNOSIS — F4542 Pain disorder with related psychological factors: Secondary | ICD-10-CM | POA: Diagnosis not present

## 2021-01-13 DIAGNOSIS — I10 Essential (primary) hypertension: Secondary | ICD-10-CM | POA: Diagnosis not present

## 2021-01-13 DIAGNOSIS — G894 Chronic pain syndrome: Secondary | ICD-10-CM | POA: Diagnosis not present

## 2021-01-22 DIAGNOSIS — L501 Idiopathic urticaria: Secondary | ICD-10-CM | POA: Diagnosis not present

## 2021-01-23 ENCOUNTER — Ambulatory Visit (HOSPITAL_BASED_OUTPATIENT_CLINIC_OR_DEPARTMENT_OTHER): Payer: Medicare Other | Admitting: Oncology

## 2021-01-23 ENCOUNTER — Ambulatory Visit (INDEPENDENT_AMBULATORY_CARE_PROVIDER_SITE_OTHER): Payer: Medicare Other

## 2021-01-23 ENCOUNTER — Other Ambulatory Visit: Payer: Self-pay

## 2021-01-23 DIAGNOSIS — C679 Malignant neoplasm of bladder, unspecified: Secondary | ICD-10-CM

## 2021-01-23 DIAGNOSIS — L501 Idiopathic urticaria: Secondary | ICD-10-CM

## 2021-01-23 NOTE — Progress Notes (Signed)
Hematology and Oncology Follow Up for Telemedicine Visits  [name] [id number] August 18, 1940 80 y.o. 01/23/2021 11:13 AM [name], MDFusco, Purcell Nails, MD   I connected with [name] on 01/23/21 at 11:00 AM EDT by telephone visit and verified that I am speaking with the correct person using two identifiers.   I discussed the limitations, risks, security and privacy concerns of performing an evaluation and management service by telemedicine and the availability of in-person appointments. I also discussed with the patient that there may be a patient responsible charge related to this service. The patient expressed understanding and agreed to proceed.  Other persons participating in the visit and their role in the encounter: His wife  Patient's location: Home Provider's location: Office  Chief Complaint:  80 year old man with bladder cancer diagnosed in 2018.  He developed stage IV with pulmonary and bone involvement confirmed in 2019.  He was found to have high-grade urothelial carcinoma, PDL 1 positive  with CPS score over 90%.   Prior Therapy:    He is status post neoadjuvant chemotherapy utilizing gemcitabine and cisplatin started in September 2018.  He received day 1 of cycle 1 on 05/02/2017 and therapy discontinued after that.   He is status post laparoscopic Cystoprostatectomy and bilateral lymphadenectomy done by Dr. Tresa Moore on 06/29/2017. Final pathology showed a T3bN0 disease.  He has 13 lymph nodes sampled without any evidence of malignancy.   He completed radiation therapy to the pelvis on January 16, 2018 under the care of Dr. Tammi Klippel.     Carboplatin and gemcitabine he is status post 6 cycles of therapy completed on 05/22/2018.   Pembrolizumab 200 mg every 3 weeks started on 06/21/2018.  He completed 8 cycles of therapy and April 2020.  Therapy discontinued because of dermatological toxicities.   He is status post radiation therapy to the right paratracheal lymph node  for a total of 50 Gray in 10 fractions completed on April 23, 2020.      Interim History: [name] reports no major changes in his health since his last evaluation on May 23.  He denies any worsening bone pain or falls.  He denies any worsening decline in his performance status.  His appetite and pain level remains managed at this time.    Medications: I have reviewed the patient's current medications.  Current Outpatient Medications  Medication Sig Dispense Refill   acetaminophen (TYLENOL) 325 MG tablet Take 2 tablets (650 mg total) by mouth every 6 (six) hours as needed for mild pain, fever or headache (or Fever >/= 101). 12 tablet 0   clotrimazole-betamethasone (LOTRISONE) cream Apply 1 application topically in the morning and at bedtime.     cyanocobalamin (,VITAMIN B-12,) 1000 MCG/ML injection Inject 1 mL (1,000 mcg total) into the muscle every 30 (thirty) days. 6 mL 1   HYDROmorphone (DILAUDID) 4 MG tablet Take 1 tablet (4 mg total) by mouth every 4 (four) hours as needed for severe pain. 120 tablet 0   lactose free nutrition (BOOST) LIQD Take 237 mLs by mouth 3 (three) times daily between meals.      linezolid (ZYVOX) 600 MG tablet TAKE 1 TABLET (600 MG TOTAL) BY MOUTH EVERY 12 (TWELVE) HOURS FOR 7 DAYS. 14 tablet 0   oxyCODONE (OXY IR/ROXICODONE) 5 MG immediate release tablet Take 1-2 tablets (5-10 mg total) by mouth every 4 (four) hours as needed for severe pain. 120 tablet 0   polyethylene glycol (MIRALAX / GLYCOLAX) 17 g packet Take 17 g  by mouth daily as needed for mild constipation. 14 each 0   Current Facility-Administered Medications  Medication Dose Route Frequency Provider Last Rate Last Admin   omalizumab Arvid Right) injection 300 mg  300 mg Subcutaneous Q14 Days Valentina Shaggy, MD   300 mg at 01/09/21 7096     Allergies:  Allergies  Allergen Reactions   Ciprofloxacin Hives   Cefepime Rash    Past Medical History, Surgical history, Social history, and Family  History were reviewed and updated.  Review of Systems:  Remaining ROS negative.  Physical Exam: There were no vitals taken for this visit. ECOG:  General appearance: alert and cooperative appeared without distress. Head: Normocephalic, without obvious abnormality Oropharynx: No oral thrush or ulcers. Eyes: No scleral icterus.  Pupils are equal and round reactive to light. Lymph nodes: Cervical, supraclavicular, and axillary nodes normal. Heart:regular rate and rhythm, S1, S2 normal, no murmur, click, rub or gallop Lung:chest clear, no wheezing, rales, normal symmetric air entry Abdomin: soft, non-tender, without masses or organomegaly. Neurological: No motor, sensory deficits.  Intact deep tendon reflexes. Skin: No rashes or lesions.  No ecchymosis or petechiae. Musculoskeletal: No joint deformity or effusion. Psychiatric: Mood and affect are appropriate.    Lab Results: Lab Results  Component Value Date   WBC 11.1 (H) 01/05/2021   HGB 14.5 01/05/2021   HCT 44.0 01/05/2021   MCV 106.3 (H) 01/05/2021   PLT 183 01/05/2021     Chemistry      Component Value Date/Time   NA 142 01/05/2021 0950   NA 140 04/30/2019 1033   NA 141 05/23/2017 0828   K 4.0 01/05/2021 0950   K 4.0 05/23/2017 0828   CL 105 01/05/2021 0950   CO2 29 01/05/2021 0950   CO2 27 05/23/2017 0828   BUN 12 01/05/2021 0950   BUN 16 04/30/2019 1033   BUN 12.5 05/23/2017 0828   CREATININE 0.91 01/05/2021 0950   CREATININE 0.8 05/23/2017 0828      Component Value Date/Time   CALCIUM 9.3 01/05/2021 0950   CALCIUM 9.0 05/23/2017 0828   ALKPHOS 62 01/05/2021 0950   ALKPHOS 57 05/23/2017 0828   AST 16 01/05/2021 0950   AST 13 05/23/2017 0828   ALT 26 01/05/2021 0950   ALT 15 05/23/2017 0828   BILITOT 0.5 01/05/2021 0950   BILITOT 0.37 05/23/2017 0828          Impression and Plan:  80 year old man with:   1.    Stage IV bladder cancer diagnosed in 2019.  He is currently on active surveillance  and under evaluation to start different salvage therapy.   His disease status was updated at this time.  We are awaiting his staging scans that are scheduled in the near future.  After these results we will discuss whether additional treatment is required.  I will be in the form of retreatment with immunotherapy, systemic chemotherapy or antibody drug conjugate such as Padcev.       2.    Constitutional symptoms of fatigue and weakness: His work-up currently unrevealing and overall clinical status is stable.  We will await the results of the scan to rule out malignancy as the cause.   3.  Follow-up: To be determined after the results of the scan.     I discussed the assessment and treatment plan with the patient. The patient was provided an opportunity to ask questions and all were answered. The patient agreed with the plan and demonstrated an understanding of  the instructions.   The patient was advised to call back or seek an in-person evaluation if the symptoms worsen or if the condition fails to improve as anticipated.  I provided 20 minutes of non face-to-face telephone visit time during this encounter.  The time was dedicated to reviewing his disease status, discussing treatment options and future plan of care.  Zola Button, MD 01/23/2021 11:13 AM

## 2021-02-03 ENCOUNTER — Ambulatory Visit (HOSPITAL_COMMUNITY)
Admission: RE | Admit: 2021-02-03 | Discharge: 2021-02-03 | Disposition: A | Discharge status: Home / Self Care | Payer: Medicare Other | Source: Ambulatory Visit | Attending: Oncology | Admitting: Oncology

## 2021-02-03 DIAGNOSIS — C7951 Secondary malignant neoplasm of bone: Secondary | ICD-10-CM | POA: Diagnosis not present

## 2021-02-03 DIAGNOSIS — G319 Degenerative disease of nervous system, unspecified: Secondary | ICD-10-CM | POA: Diagnosis not present

## 2021-02-03 DIAGNOSIS — N10 Acute tubulo-interstitial nephritis: Secondary | ICD-10-CM | POA: Diagnosis not present

## 2021-02-03 DIAGNOSIS — K7689 Other specified diseases of liver: Secondary | ICD-10-CM | POA: Diagnosis not present

## 2021-02-03 DIAGNOSIS — C7800 Secondary malignant neoplasm of unspecified lung: Secondary | ICD-10-CM | POA: Diagnosis not present

## 2021-02-03 DIAGNOSIS — C679 Malignant neoplasm of bladder, unspecified: Secondary | ICD-10-CM

## 2021-02-03 DIAGNOSIS — G9389 Other specified disorders of brain: Secondary | ICD-10-CM | POA: Diagnosis not present

## 2021-02-03 DIAGNOSIS — I2510 Atherosclerotic heart disease of native coronary artery without angina pectoris: Secondary | ICD-10-CM | POA: Diagnosis not present

## 2021-02-03 DIAGNOSIS — J90 Pleural effusion, not elsewhere classified: Secondary | ICD-10-CM | POA: Diagnosis not present

## 2021-02-03 DIAGNOSIS — Z5111 Encounter for antineoplastic chemotherapy: Secondary | ICD-10-CM | POA: Diagnosis not present

## 2021-02-03 DIAGNOSIS — Z20822 Contact with and (suspected) exposure to covid-19: Secondary | ICD-10-CM | POA: Diagnosis not present

## 2021-02-03 DIAGNOSIS — A419 Sepsis, unspecified organism: Secondary | ICD-10-CM | POA: Diagnosis not present

## 2021-02-03 MED ORDER — GADOBUTROL 1 MMOL/ML IV SOLN
10.0000 mL | Freq: Once | INTRAVENOUS | Status: AC | PRN
  Administered 2021-02-03: 10 mL via INTRAVENOUS

## 2021-02-05 ENCOUNTER — Encounter (HOSPITAL_COMMUNITY): Payer: Self-pay | Admitting: *Deleted

## 2021-02-05 ENCOUNTER — Inpatient Hospital Stay (HOSPITAL_COMMUNITY)
Admission: EM | Admit: 2021-02-05 | Discharge: 2021-02-08 | DRG: 872 | Disposition: A | Discharge status: Home / Self Care | Payer: Medicare Other | Attending: Family Medicine | Admitting: Family Medicine

## 2021-02-05 ENCOUNTER — Other Ambulatory Visit: Payer: Self-pay

## 2021-02-05 DIAGNOSIS — N390 Urinary tract infection, site not specified: Secondary | ICD-10-CM

## 2021-02-05 DIAGNOSIS — Z9221 Personal history of antineoplastic chemotherapy: Secondary | ICD-10-CM | POA: Diagnosis not present

## 2021-02-05 DIAGNOSIS — Z936 Other artificial openings of urinary tract status: Secondary | ICD-10-CM

## 2021-02-05 DIAGNOSIS — Z20822 Contact with and (suspected) exposure to covid-19: Secondary | ICD-10-CM | POA: Diagnosis present

## 2021-02-05 DIAGNOSIS — Z888 Allergy status to other drugs, medicaments and biological substances status: Secondary | ICD-10-CM

## 2021-02-05 DIAGNOSIS — Z79899 Other long term (current) drug therapy: Secondary | ICD-10-CM

## 2021-02-05 DIAGNOSIS — C7800 Secondary malignant neoplasm of unspecified lung: Secondary | ICD-10-CM | POA: Diagnosis present

## 2021-02-05 DIAGNOSIS — Z87440 Personal history of urinary (tract) infections: Secondary | ICD-10-CM | POA: Diagnosis not present

## 2021-02-05 DIAGNOSIS — Z880 Allergy status to penicillin: Secondary | ICD-10-CM | POA: Diagnosis not present

## 2021-02-05 DIAGNOSIS — Z79891 Long term (current) use of opiate analgesic: Secondary | ICD-10-CM | POA: Diagnosis not present

## 2021-02-05 DIAGNOSIS — C7951 Secondary malignant neoplasm of bone: Secondary | ICD-10-CM | POA: Diagnosis present

## 2021-02-05 DIAGNOSIS — G8929 Other chronic pain: Secondary | ICD-10-CM | POA: Diagnosis present

## 2021-02-05 DIAGNOSIS — Z515 Encounter for palliative care: Secondary | ICD-10-CM | POA: Diagnosis not present

## 2021-02-05 DIAGNOSIS — A419 Sepsis, unspecified organism: Principal | ICD-10-CM | POA: Diagnosis present

## 2021-02-05 DIAGNOSIS — N10 Acute tubulo-interstitial nephritis: Secondary | ICD-10-CM | POA: Diagnosis present

## 2021-02-05 DIAGNOSIS — N400 Enlarged prostate without lower urinary tract symptoms: Secondary | ICD-10-CM | POA: Diagnosis present

## 2021-02-05 DIAGNOSIS — K219 Gastro-esophageal reflux disease without esophagitis: Secondary | ICD-10-CM | POA: Diagnosis present

## 2021-02-05 DIAGNOSIS — Z87891 Personal history of nicotine dependence: Secondary | ICD-10-CM | POA: Diagnosis not present

## 2021-02-05 DIAGNOSIS — T450X5A Adverse effect of antiallergic and antiemetic drugs, initial encounter: Secondary | ICD-10-CM | POA: Diagnosis present

## 2021-02-05 DIAGNOSIS — Y92239 Unspecified place in hospital as the place of occurrence of the external cause: Secondary | ICD-10-CM | POA: Diagnosis present

## 2021-02-05 DIAGNOSIS — Z881 Allergy status to other antibiotic agents status: Secondary | ICD-10-CM

## 2021-02-05 DIAGNOSIS — C679 Malignant neoplasm of bladder, unspecified: Secondary | ICD-10-CM | POA: Diagnosis present

## 2021-02-05 DIAGNOSIS — Z923 Personal history of irradiation: Secondary | ICD-10-CM

## 2021-02-05 DIAGNOSIS — L270 Generalized skin eruption due to drugs and medicaments taken internally: Secondary | ICD-10-CM | POA: Diagnosis present

## 2021-02-05 DIAGNOSIS — Z7189 Other specified counseling: Secondary | ICD-10-CM | POA: Diagnosis not present

## 2021-02-05 LAB — URINALYSIS, ROUTINE W REFLEX MICROSCOPIC
Bilirubin Urine: NEGATIVE
Glucose, UA: NEGATIVE mg/dL
Ketones, ur: NEGATIVE mg/dL
Nitrite: POSITIVE — AB
Protein, ur: 100 mg/dL — AB
Specific Gravity, Urine: 1.012 (ref 1.005–1.030)
WBC, UA: >50 WBC/hpf — ABNORMAL HIGH (ref 0–5)
pH: 6 (ref 5.0–8.0)

## 2021-02-05 LAB — COMPREHENSIVE METABOLIC PANEL
ALT: 14 U/L (ref 0–44)
AST: 14 U/L — ABNORMAL LOW (ref 15–41)
Albumin: 2.9 g/dL — ABNORMAL LOW (ref 3.5–5.0)
Alkaline Phosphatase: 50 U/L (ref 38–126)
Anion gap: 6 (ref 5–15)
BUN: 21 mg/dL (ref 8–23)
CO2: 28 mmol/L (ref 22–32)
Calcium: 8.7 mg/dL — ABNORMAL LOW (ref 8.9–10.3)
Chloride: 103 mmol/L (ref 98–111)
Creatinine, Ser: 1.15 mg/dL (ref 0.61–1.24)
GFR, Estimated: >60 mL/min (ref >60)
Glucose, Bld: 130 mg/dL — ABNORMAL HIGH (ref 70–99)
Potassium: 3.7 mmol/L (ref 3.5–5.1)
Sodium: 137 mmol/L (ref 135–145)
Total Bilirubin: 1.5 mg/dL — ABNORMAL HIGH (ref 0.3–1.2)
Total Protein: 5.7 g/dL — ABNORMAL LOW (ref 6.5–8.1)

## 2021-02-05 LAB — CBC WITH DIFFERENTIAL/PLATELET
Abs Immature Granulocytes: 0.11 10*3/uL — ABNORMAL HIGH (ref 0.00–0.07)
Basophils Absolute: 0.1 10*3/uL (ref 0.0–0.1)
Basophils Relative: 0 %
Eosinophils Absolute: 0.1 10*3/uL (ref 0.0–0.5)
Eosinophils Relative: 0 %
HCT: 41.4 % (ref 39.0–52.0)
Hemoglobin: 13.6 g/dL (ref 13.0–17.0)
Immature Granulocytes: 1 %
Lymphocytes Relative: 2 %
Lymphs Abs: 0.5 10*3/uL — ABNORMAL LOW (ref 0.7–4.0)
MCH: 35.7 pg — ABNORMAL HIGH (ref 26.0–34.0)
MCHC: 32.9 g/dL (ref 30.0–36.0)
MCV: 108.7 fL — ABNORMAL HIGH (ref 80.0–100.0)
Monocytes Absolute: 2.7 10*3/uL — ABNORMAL HIGH (ref 0.1–1.0)
Monocytes Relative: 13 %
Neutro Abs: 17 10*3/uL — ABNORMAL HIGH (ref 1.7–7.7)
Neutrophils Relative %: 84 %
Platelets: 182 10*3/uL (ref 150–400)
RBC: 3.81 MIL/uL — ABNORMAL LOW (ref 4.22–5.81)
RDW: 13.2 % (ref 11.5–15.5)
WBC: 20.4 10*3/uL — ABNORMAL HIGH (ref 4.0–10.5)
nRBC: 0 % (ref 0.0–0.2)

## 2021-02-05 LAB — PROTIME-INR
INR: 1.1 (ref 0.8–1.2)
Prothrombin Time: 14.4 seconds (ref 11.4–15.2)

## 2021-02-05 LAB — LACTIC ACID, PLASMA
Lactic Acid, Venous: 0.8 mmol/L (ref 0.5–1.9)
Lactic Acid, Venous: 2.2 mmol/L (ref 0.5–1.9)

## 2021-02-05 LAB — BRAIN NATRIURETIC PEPTIDE: B Natriuretic Peptide: 37 pg/mL (ref 0.0–100.0)

## 2021-02-05 LAB — APTT: aPTT: 29 seconds (ref 24–36)

## 2021-02-05 MED ORDER — MAGNESIUM CITRATE PO SOLN: 1.0000 | Freq: Once | ORAL | Status: DC | PRN

## 2021-02-05 MED ORDER — SODIUM CHLORIDE 0.9 % IV SOLN
1.0000 g | Freq: Once | INTRAVENOUS | Status: AC
  Administered 2021-02-05: 1 g via INTRAVENOUS
  Filled 2021-02-05: qty 1

## 2021-02-05 MED ORDER — DIPHENHYDRAMINE HCL 25 MG PO CAPS: 25.0000 mg | ORAL_CAPSULE | Freq: Four times a day (QID) | ORAL | Status: DC | PRN

## 2021-02-05 MED ORDER — HYDROMORPHONE HCL 4 MG PO TABS
4.0000 mg | ORAL_TABLET | ORAL | Status: DC | PRN
  Filled 2021-02-05: qty 2

## 2021-02-05 MED ORDER — SODIUM CHLORIDE 0.9% FLUSH
3.0000 mL | Freq: Two times a day (BID) | INTRAVENOUS | Status: DC
  Administered 2021-02-05 – 2021-02-07 (×5): 3 mL via INTRAVENOUS

## 2021-02-05 MED ORDER — SODIUM CHLORIDE 0.9 % IV SOLN
400.0000 mg | Freq: Once | INTRAVENOUS | Status: AC
  Administered 2021-02-05: 400 mg via INTRAVENOUS
  Filled 2021-02-05: qty 4

## 2021-02-05 MED ORDER — SODIUM CHLORIDE 0.9 % IV BOLUS
2000.0000 mL | Freq: Once | INTRAVENOUS | Status: AC
  Administered 2021-02-05: 2000 mL via INTRAVENOUS

## 2021-02-05 MED ORDER — OMALIZUMAB 150 MG ~~LOC~~ SOLR: 300.0000 mg | SUBCUTANEOUS | Status: DC

## 2021-02-05 MED ORDER — SODIUM CHLORIDE 0.9 % IV SOLN
1.0000 g | Freq: Three times a day (TID) | INTRAVENOUS | Status: DC
  Administered 2021-02-05 – 2021-02-08 (×8): 1 g via INTRAVENOUS
  Filled 2021-02-05 (×8): qty 1

## 2021-02-05 MED ORDER — ONDANSETRON HCL 4 MG/2ML IJ SOLN
4.0000 mg | Freq: Four times a day (QID) | INTRAMUSCULAR | Status: DC | PRN
  Administered 2021-02-05: 4 mg via INTRAVENOUS
  Filled 2021-02-05: qty 2

## 2021-02-05 MED ORDER — DIPHENHYDRAMINE HCL 50 MG/ML IJ SOLN
12.5000 mg | Freq: Four times a day (QID) | INTRAMUSCULAR | Status: DC | PRN
  Administered 2021-02-05: 12.5 mg via INTRAVENOUS
  Filled 2021-02-05: qty 1

## 2021-02-05 MED ORDER — LACTATED RINGERS IV SOLN: INTRAVENOUS | Status: DC

## 2021-02-05 MED ORDER — SODIUM CHLORIDE 0.9 % IV BOLUS
250.0000 mL | Freq: Once | INTRAVENOUS | Status: AC
  Administered 2021-02-05: 250 mL via INTRAVENOUS

## 2021-02-05 MED ORDER — BISACODYL 5 MG PO TBEC: 5.0000 mg | DELAYED_RELEASE_TABLET | Freq: Every day | ORAL | Status: DC | PRN

## 2021-02-05 MED ORDER — ACETAMINOPHEN 650 MG RE SUPP
650.0000 mg | Freq: Four times a day (QID) | RECTAL | Status: DC | PRN
  Administered 2021-02-05: 650 mg via RECTAL
  Filled 2021-02-05: qty 1

## 2021-02-05 MED ORDER — IPRATROPIUM BROMIDE 0.02 % IN SOLN: 0.5000 mg | Freq: Four times a day (QID) | RESPIRATORY_TRACT | Status: DC | PRN

## 2021-02-05 MED ORDER — HYDROMORPHONE HCL 1 MG/ML IJ SOLN
0.5000 mg | INTRAMUSCULAR | Status: DC | PRN
  Administered 2021-02-05: 1 mg via INTRAVENOUS
  Filled 2021-02-05: qty 1

## 2021-02-05 MED ORDER — ACETAMINOPHEN 325 MG RE SUPP
325.0000 mg | Freq: Once | RECTAL | Status: AC
  Administered 2021-02-05: 325 mg via RECTAL
  Filled 2021-02-05: qty 1

## 2021-02-05 MED ORDER — SODIUM CHLORIDE 0.9 % IV SOLN: 2.0000 g | Freq: Once | INTRAVENOUS | Status: DC

## 2021-02-05 MED ORDER — HYDRALAZINE HCL 20 MG/ML IJ SOLN: 10.0000 mg | INTRAMUSCULAR | Status: DC | PRN

## 2021-02-05 MED ORDER — HEPARIN SODIUM (PORCINE) 5000 UNIT/ML IJ SOLN
5000.0000 [IU] | Freq: Three times a day (TID) | INTRAMUSCULAR | Status: DC
  Administered 2021-02-05 – 2021-02-08 (×8): 5000 [IU] via SUBCUTANEOUS
  Filled 2021-02-05 (×9): qty 1

## 2021-02-05 MED ORDER — OXYCODONE HCL 5 MG PO TABS
5.0000 mg | ORAL_TABLET | ORAL | Status: DC | PRN
  Administered 2021-02-06 – 2021-02-07 (×4): 5 mg via ORAL
  Filled 2021-02-05 (×4): qty 1

## 2021-02-05 MED ORDER — ACETAMINOPHEN 325 MG PO TABS: 650.0000 mg | ORAL_TABLET | Freq: Four times a day (QID) | ORAL | Status: DC | PRN

## 2021-02-05 MED ORDER — SENNOSIDES-DOCUSATE SODIUM 8.6-50 MG PO TABS: 1.0000 | ORAL_TABLET | Freq: Every evening | ORAL | Status: DC | PRN

## 2021-02-05 MED ORDER — TRAZODONE HCL 50 MG PO TABS: 25.0000 mg | ORAL_TABLET | Freq: Every evening | ORAL | Status: DC | PRN

## 2021-02-05 MED ORDER — ONDANSETRON HCL 4 MG PO TABS: 4.0000 mg | ORAL_TABLET | Freq: Four times a day (QID) | ORAL | Status: DC | PRN

## 2021-02-05 MED ORDER — CYANOCOBALAMIN 1000 MCG/ML IJ SOLN
1000.0000 ug | INTRAMUSCULAR | Status: DC
  Administered 2021-02-05: 1000 ug via INTRAMUSCULAR
  Filled 2021-02-05: qty 1

## 2021-02-05 MED ORDER — LEVALBUTEROL HCL 0.63 MG/3ML IN NEBU: 0.6300 mg | INHALATION_SOLUTION | Freq: Four times a day (QID) | RESPIRATORY_TRACT | Status: DC | PRN

## 2021-02-05 NOTE — ED Provider Notes (Signed)
[facility] EMERGENCY DEPARTMENT Provider Note   CSN: [id number] Arrival date & time: 02/05/21  1057     History Chief Complaint  Patient presents with   Fever    [name] is a 80 y.o. male.  Patient has a urostomy bag from bladder cancer.  He has developed fevers and chills and feels like he has another bladder infection.  The history is provided by the patient and medical records. No language interpreter was used.  Fever Temp source:  Oral Severity:  Moderate Onset quality:  Sudden Duration: 1 day. Timing:  Constant Progression:  Worsening Chronicity:  New Relieved by:  None tried Worsened by:  Nothing Associated symptoms: no chest pain, no congestion, no cough, no diarrhea, no headaches and no rash       Past Medical History:  Diagnosis Date   Bladder cancer ([place])    BPH (benign prostatic hypertrophy)    Dysuria    GERD (gastroesophageal reflux disease)    History of adenomatous polyp of colon    tubular adenoma 06/ 2018   History of bladder cancer 12/2014   urologist-  dr [name]--  dx High Grade TCC without stromal invasion s/p TURBT and intravesical BCG tx/  recurrent 07/2015 (pTa) TCC  repear intravesical BCG tx    History of hiatal hernia    History of urethral stricture    Nocturia     Patient Active Problem List   Diagnosis Date Noted   Complicated UTI (urinary tract infection) 07/13/2020   Sepsis ([place]) 07/13/2020   Pseudomonas aeruginosa infection 06/18/2020   GERD (gastroesophageal reflux disease)    Sinus tachycardia    Fever    Dehydration    Rigors    Severe sepsis ([facility])    Elevated lactic acid level    Cancer with pulmonary metastases ([place]) 03/31/2020   Redness and swelling of thigh 06/05/2018   Adverse drug reaction 06/05/2018   Bacteremia associated with intravascular line ([place]) 04/20/2018   Febrile neutropenia ([place]) 04/13/2018   Gram positive sepsis ([place]) 04/13/2018   SIRS (systemic inflammatory response syndrome) ([place])  04/12/2018   Bacteremia due to Gram-positive bacteria 04/12/2018   Pancytopenia ([place]) 04/12/2018   Status post chemotherapy    UTI (urinary tract infection) 04/11/2018   Port-A-Cath in place 02/20/2018   Encounter for antineoplastic chemotherapy 02/17/2018   Sepsis due to urinary tract infection ([place]) 01/25/2018   Sepsis secondary to UTI ([place]) 01/25/2018   Hyperglycemia    Goals of care, counseling/discussion 01/18/2018   Bone metastasis ([place]) 12/29/2017   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/29/2017   Bladder cancer ([place]) 06/28/2017   Malignant neoplasm of urinary bladder ([place]) 04/20/2017   COPD UNSPECIFIED 03/07/2008    Past Surgical History:  Procedure Laterality Date   CARDIAC CATHETERIZATION  1997   normal coronary arteries   CARDIOVASCULAR STRESS TEST  12-14-2005   Low risk perfusion study/  very small scar in the inferior septum from mid ventricle to apex,  no ischemia/  mild inferior septal hypokinesis/  ef 58%   CATARACT EXTRACTION W/ INTRAOCULAR LENS  IMPLANT, BILATERAL  2011   COLONOSCOPY  last one 06/ 2018   CYSTOSCOPY WITH BIOPSY N/A 08/12/2015   Procedure: CYSTOSCOPY WITH BIOPSY AND FULGERATION;  Surgeon: [name], MD;  Location: [facility];  Service: Urology;  Laterality: N/A;   CYSTOSCOPY WITH INJECTION N/A 06/29/2017   Procedure: CYSTOSCOPY WITH INJECTION OF INDOCYANINE GREEN DYE;  Surgeon: Alexis Frock, MD;  Location: [facility];  Service: Urology;  Laterality: N/A;   CYSTOSCOPY WITH URETHRAL DILATATION N/A 03/21/2017   Procedure: CYSTOSCOPY;  Surgeon: [name], MD;  Location: [facility];  Service: Urology;  Laterality: N/A;   ESOPHAGOGASTRODUODENOSCOPY  12-05-2014   IR IMAGING GUIDED PORT INSERTION  02/07/2018   IR REMOVAL TUN ACCESS W/ PORT W/O FL MOD SED  04/14/2018   TRANSTHORACIC ECHOCARDIOGRAM  01-31-2008   nomral LVF,  ef 55-65%/  mild pulmonic stenosis without regurg.,  peak transpulomonic valve gradient  56mmHg   TRANSURETHRAL RESECTION OF BLADDER TUMOR N/A 12/31/2014   Procedure: TRANSURETHRAL RESECTION OF BLADDER TUMOR (TURBT);  Surgeon: Lowella Bandy, MD;  Location: [facility];  Service: Urology;  Laterality: N/A;   TRANSURETHRAL RESECTION OF BLADDER TUMOR N/A 03/21/2017   Procedure: POSSIBLE TRANSURETHRAL RESECTION OF BLADDER TUMOR (TURBT);  Surgeon: [name], MD;  Location: [facility];  Service: Urology;  Laterality: N/A;       Family History  Problem Relation Age of Onset   Alcoholism Father    Colon cancer Neg Hx    Colon polyps Neg Hx    Diabetes Neg Hx    Kidney disease Neg Hx    Esophageal cancer Neg Hx    Heart disease Neg Hx    Gallbladder disease Neg Hx    Allergic rhinitis Neg Hx    Angioedema Neg Hx    Asthma Neg Hx    Atopy Neg Hx    Eczema Neg Hx    Immunodeficiency Neg Hx    Urticaria Neg Hx     Social History   Tobacco Use   Smoking status: Former    Packs/day: 1.00    Years: 34.00    Pack years: 34.00    Types: Cigarettes    Quit date: 12/26/1988    Years since quitting: 32.1   Smokeless tobacco: Never  Vaping Use   Vaping Use: Never used  Substance Use Topics   Alcohol use: Yes    Alcohol/week: 6.0 standard drinks    Types: 6 Cans of beer per week    Comment: BEER   Drug use: No    Home Medications Prior to Admission medications   Medication Sig Start Date End Date Taking? Authorizing Provider  acetaminophen (TYLENOL) 325 MG tablet Take 2 tablets (650 mg total) by mouth every 6 (six) hours as needed for mild pain, fever or headache (or Fever >/= 101). 06/19/20  Yes Emokpae, Courage, MD  clotrimazole-betamethasone (LOTRISONE) cream Apply 1 application topically in the morning and at bedtime. 06/27/20  Yes [provider]  HYDROmorphone (DILAUDID) 4 MG tablet Take 1 tablet (4 mg total) by mouth every 4 (four) hours as needed for severe pain. 01/07/21  Yes [name], MD  lactose free nutrition  (BOOST) LIQD Take 237 mLs by mouth 3 (three) times daily between meals.    Yes [provider]  oxyCODONE (OXY IR/ROXICODONE) 5 MG immediate release tablet Take 1-2 tablets (5-10 mg total) by mouth every 4 (four) hours as needed for severe pain. 01/07/21  Yes [name], MD  polyethylene glycol (MIRALAX / GLYCOLAX) 17 g packet Take 17 g by mouth daily as needed for mild constipation. 07/18/20  Yes Emokpae, Courage, MD  cyanocobalamin (,VITAMIN B-12,) 1000 MCG/ML injection Inject 1 mL (1,000 mcg total) into the muscle every 30 (thirty) days. Patient not taking: No sig reported 07/18/20   Roxan Hockey, MD  linezolid (ZYVOX) 600 MG tablet TAKE 1 TABLET (  600 MG TOTAL) BY MOUTH EVERY 12 (TWELVE) HOURS FOR 7 DAYS. Patient not taking: No sig reported 07/17/20 07/17/21  Roxan Hockey, MD    Allergies    Ciprofloxacin and Cefepime  Review of Systems   Review of Systems  Constitutional:  Positive for fever. Negative for appetite change and fatigue.  HENT:  Negative for congestion, ear discharge and sinus pressure.   Eyes:  Negative for discharge.  Respiratory:  Negative for cough.   Cardiovascular:  Negative for chest pain.  Gastrointestinal:  Negative for abdominal pain and diarrhea.  Genitourinary:  Negative for frequency and hematuria.  Musculoskeletal:  Negative for back pain.  Skin:  Negative for rash.  Neurological:  Negative for seizures and headaches.  Psychiatric/Behavioral:  Negative for hallucinations.    Physical Exam Updated Vital Signs BP 105/65   Pulse 77   Temp 98.6 F (37 C) (Oral)   Resp 17   SpO2 94%   Physical Exam Vitals and nursing note reviewed.  Constitutional:      Appearance: He is well-developed.  HENT:     Head: Normocephalic.  Eyes:     General: No scleral icterus.    Conjunctiva/sclera: Conjunctivae normal.  Neck:     Thyroid: No thyromegaly.  Cardiovascular:     Rate and Rhythm: Normal rate and regular rhythm.     Heart sounds: No  murmur heard.   No friction rub. No gallop.  Pulmonary:     Breath sounds: No stridor. No wheezing or rales.  Chest:     Chest wall: No tenderness.  Abdominal:     General: There is no distension.     Tenderness: There is no abdominal tenderness. There is no rebound.     Comments: Urostomy bag  Musculoskeletal:        General: Normal range of motion.     Cervical back: Neck supple.  Lymphadenopathy:     Cervical: No cervical adenopathy.  Skin:    Findings: No erythema or rash.  Neurological:     Mental Status: He is alert and oriented to person, place, and time.     Motor: No abnormal muscle tone.     Coordination: Coordination normal.  Psychiatric:        Behavior: Behavior normal.    ED Results / Procedures / Treatments   Labs (all labs ordered are listed, but only abnormal results are displayed) Labs Reviewed  CBC WITH DIFFERENTIAL/PLATELET - Abnormal; Notable for the following components:      Result Value   WBC 20.4 (*)    RBC 3.81 (*)    MCV 108.7 (*)    MCH 35.7 (*)    Neutro Abs 17.0 (*)    Lymphs Abs 0.5 (*)    Monocytes Absolute 2.7 (*)    Abs Immature Granulocytes 0.11 (*)    All other components within normal limits  COMPREHENSIVE METABOLIC PANEL - Abnormal; Notable for the following components:   Glucose, Bld 130 (*)    Calcium 8.7 (*)    Total Protein 5.7 (*)    Albumin 2.9 (*)    AST 14 (*)    Total Bilirubin 1.5 (*)    All other components within normal limits  URINALYSIS, ROUTINE W REFLEX MICROSCOPIC - Abnormal; Notable for the following components:   APPearance CLOUDY (*)    Hgb urine dipstick SMALL (*)    Protein, ur 100 (*)    Nitrite POSITIVE (*)    Leukocytes,Ua LARGE (*)    WBC,  UA >50 (*)    Bacteria, UA RARE (*)    Non Squamous Epithelial 0-5 (*)    All other components within normal limits  CULTURE, BLOOD (SINGLE)  CULTURE, BLOOD (ROUTINE X 2)  CULTURE, BLOOD (ROUTINE X 2)  URINE CULTURE  LACTIC ACID, PLASMA  BRAIN NATRIURETIC  PEPTIDE  LACTIC ACID, PLASMA  LACTIC ACID, PLASMA  PROTIME-INR  APTT    EKG None  Radiology No results found.  Procedures Procedures   Medications Ordered in ED Medications  lactated ringers infusion (has no administration in time range)  sodium chloride 0.9 % bolus 2,000 mL (has no administration in time range)  meropenem (MERREM) 1 g in sodium chloride 0.9 % 100 mL IVPB (1 g Intravenous New Bag/Given 02/05/21 1456)  sodium chloride flush (NS) 0.9 % injection 3 mL (has no administration in time range)  sodium chloride flush (NS) 0.9 % injection 3 mL (has no administration in time range)  acetaminophen (TYLENOL) tablet 650 mg (has no administration in time range)    Or  acetaminophen (TYLENOL) suppository 650 mg (has no administration in time range)  oxyCODONE (Oxy IR/ROXICODONE) immediate release tablet 5 mg (has no administration in time range)  HYDROmorphone (DILAUDID) injection 0.5-1 mg (has no administration in time range)  traZODone (DESYREL) tablet 25 mg (has no administration in time range)  senna-docusate (Senokot-S) tablet 1 tablet (has no administration in time range)  bisacodyl (DULCOLAX) EC tablet 5 mg (has no administration in time range)  magnesium citrate solution 1 Bottle (has no administration in time range)  ondansetron (ZOFRAN) tablet 4 mg (has no administration in time range)    Or  ondansetron (ZOFRAN) injection 4 mg (has no administration in time range)  ipratropium (ATROVENT) nebulizer solution 0.5 mg (has no administration in time range)  levalbuterol (XOPENEX) nebulizer solution 0.63 mg (has no administration in time range)  hydrALAZINE (APRESOLINE) injection 10 mg (has no administration in time range)  heparin injection 5,000 Units (has no administration in time range)  meropenem (MERREM) 1 g in sodium chloride 0.9 % 100 mL IVPB (has no administration in time range)  aztreonam (AZACTAM) 2 g in sodium chloride 0.9 % 100 mL IVPB (has no administration  in time range)  lactated ringers infusion (has no administration in time range)  sodium chloride 0.9 % bolus 250 mL (0 mLs Intravenous Stopped 02/05/21 1344)    ED Course  I have reviewed the triage vital signs and the nursing notes.  Pertinent labs & imaging results that were available during my care of the patient were reviewed by me and considered in my medical decision making (see chart for details).    MDM Rules/Calculators/A&P                          Patient with sepsis secondary to urinary tract infection.  He will be admitted to medicine Final Clinical Impression(s) / ED Diagnoses Final diagnoses:  Acute pyelonephritis    Rx / DC Orders ED Discharge Orders     None        Milton Ferguson, MD 02/09/21 [phone]

## 2021-02-05 NOTE — H&P (Signed)
History and Physical   Patient: [name]                            PCP: [name], MD                    DOB: Aug 13, 1941            DOA: 02/05/2021 [id number]             DOS: 02/05/2021, 3:22 PM  Patient coming from:   HOME  I have personally reviewed patient's medical records, in electronic medical records, including:  [place] link, and care everywhere.    Chief Complaint:   Chief Complaint  Patient presents with   Fever    History of present illness:    [name] is a 80 y.o. male with medical history significant of stage IV metastatic bladder cancer, status post radiation, chemotherapy, status post post ilial conduit, chronic recurrent UTIs, chronic pain, BPH, GERD, presented with fever, chills. Reports feels like another bladder infection.    Patient Denies having: Cough, SOB, Chest Pain, Abd pain, N/V/D, headache, dizziness, lightheadedness,  Dysuria, Joint pain, rash, open wounds  ED Course:   Vitals: Temp 98.6 pulse 109 respiratory rate 14-20, BP 105/65, satting 94% on room air, Abnormal labs; WBC 20.4, lactic acid 0.8, electrolytes within normal limits, BUN 21, creatinine 1.15,  UA: Cloudy, large leukocyte Estrace, nitrite, WBC > 50 CT abdomen pelvis -pending  CT abdomen pelvis chest without contrast reviewed from 02/03/21  Review of Systems: As per HPI, otherwise 10 point review of systems were negative.   ----------------------------------------------------------------------------------------------------------------------  Allergies  Allergen Reactions   Ciprofloxacin Hives   Cefepime Rash    Home MEDs:  Prior to Admission medications   Medication Sig Start Date End Date Taking? Authorizing Provider  acetaminophen (TYLENOL) 325 MG tablet Take 2 tablets (650 mg total) by mouth every 6 (six) hours as needed for mild pain, fever or headache (or Fever >/= 101). 06/19/20  Yes Emokpae, Courage, MD  clotrimazole-betamethasone (LOTRISONE)  cream Apply 1 application topically in the morning and at bedtime. 06/27/20  Yes [provider]  HYDROmorphone (DILAUDID) 4 MG tablet Take 1 tablet (4 mg total) by mouth every 4 (four) hours as needed for severe pain. 01/07/21  Yes [name], MD  lactose free nutrition (BOOST) LIQD Take 237 mLs by mouth 3 (three) times daily between meals.    Yes [provider]  oxyCODONE (OXY IR/ROXICODONE) 5 MG immediate release tablet Take 1-2 tablets (5-10 mg total) by mouth every 4 (four) hours as needed for severe pain. 01/07/21  Yes [name], MD  polyethylene glycol (MIRALAX / GLYCOLAX) 17 g packet Take 17 g by mouth daily as needed for mild constipation. 07/18/20  Yes Emokpae, Courage, MD  cyanocobalamin (,VITAMIN B-12,) 1000 MCG/ML injection Inject 1 mL (1,000 mcg total) into the muscle every 30 (thirty) days. Patient not taking: No sig reported 07/18/20   Roxan Hockey, MD  linezolid (ZYVOX) 600 MG tablet TAKE 1 TABLET (600 MG TOTAL) BY MOUTH EVERY 12 (TWELVE) HOURS FOR 7 DAYS. Patient not taking: No sig reported 07/17/20 07/17/21  Roxan Hockey, MD    PRN MEDs: acetaminophen **OR** acetaminophen, bisacodyl, hydrALAZINE, HYDROmorphone (DILAUDID) injection, ipratropium, levalbuterol, magnesium citrate, ondansetron **OR** ondansetron (ZOFRAN) IV, oxyCODONE, senna-docusate, traZODone  Past Medical History:  Diagnosis Date   Bladder cancer ([place])    BPH (benign prostatic  hypertrophy)    Dysuria    GERD (gastroesophageal reflux disease)    History of adenomatous polyp of colon    tubular adenoma 06/ 2018   History of bladder cancer 12/2014   urologist-  dr [name]--  dx High Grade TCC without stromal invasion s/p TURBT and intravesical BCG tx/  recurrent 07/2015 (pTa) TCC  repear intravesical BCG tx    History of hiatal hernia    History of urethral stricture    Nocturia     Past Surgical History:  Procedure Laterality Date   CARDIAC CATHETERIZATION  1997   normal  coronary arteries   CARDIOVASCULAR STRESS TEST  12-14-2005   Low risk perfusion study/  very small scar in the inferior septum from mid ventricle to apex,  no ischemia/  mild inferior septal hypokinesis/  ef 58%   CATARACT EXTRACTION W/ INTRAOCULAR LENS  IMPLANT, BILATERAL  2011   COLONOSCOPY  last one 06/ 2018   CYSTOSCOPY WITH BIOPSY N/A 08/12/2015   Procedure: CYSTOSCOPY WITH BIOPSY AND FULGERATION;  Surgeon: [name], MD;  Location: [facility];  Service: Urology;  Laterality: N/A;   CYSTOSCOPY WITH INJECTION N/A 06/29/2017   Procedure: CYSTOSCOPY WITH INJECTION OF INDOCYANINE GREEN DYE;  Surgeon: Alexis Frock, MD;  Location: [facility];  Service: Urology;  Laterality: N/A;   CYSTOSCOPY WITH URETHRAL DILATATION N/A 03/21/2017   Procedure: CYSTOSCOPY;  Surgeon: [name], MD;  Location: [facility];  Service: Urology;  Laterality: N/A;   ESOPHAGOGASTRODUODENOSCOPY  12-05-2014   IR IMAGING GUIDED PORT INSERTION  02/07/2018   IR REMOVAL TUN ACCESS W/ PORT W/O FL MOD SED  04/14/2018   TRANSTHORACIC ECHOCARDIOGRAM  01-31-2008   nomral LVF,  ef 55-65%/  mild pulmonic stenosis without regurg.,  peak transpulomonic valve gradient 31mmHg   TRANSURETHRAL RESECTION OF BLADDER TUMOR N/A 12/31/2014   Procedure: TRANSURETHRAL RESECTION OF BLADDER TUMOR (TURBT);  Surgeon: Lowella Bandy, MD;  Location: [facility];  Service: Urology;  Laterality: N/A;   TRANSURETHRAL RESECTION OF BLADDER TUMOR N/A 03/21/2017   Procedure: POSSIBLE TRANSURETHRAL RESECTION OF BLADDER TUMOR (TURBT);  Surgeon: [name], MD;  Location: [facility];  Service: Urology;  Laterality: N/A;     reports that he quit smoking about 32 years ago. His smoking use included cigarettes. He has a 34.00 pack-year smoking history. He has never used smokeless tobacco. He reports current alcohol use of about 6.0 standard drinks of alcohol per week. He reports that he  does not use drugs.   Family History  Problem Relation Age of Onset   Alcoholism Father    Colon cancer Neg Hx    Colon polyps Neg Hx    Diabetes Neg Hx    Kidney disease Neg Hx    Esophageal cancer Neg Hx    Heart disease Neg Hx    Gallbladder disease Neg Hx    Allergic rhinitis Neg Hx    Angioedema Neg Hx    Asthma Neg Hx    Atopy Neg Hx    Eczema Neg Hx    Immunodeficiency Neg Hx    Urticaria Neg Hx     Physical Exam:   Vitals:   02/05/21 1230 02/05/21 1300 02/05/21 1330 02/05/21 1400  BP: 105/75 113/74 108/74 105/65  Pulse: 80 76 73 77  Resp: 20 18 14 17   Temp:      TempSrc:      SpO2: 92% 91% 91% 94%   Constitutional:  NAD, calm, comfortable Eyes: PERRL, lids and conjunctivae normal ENMT: Mucous membranes are moist. Posterior pharynx clear of any exudate or lesions.Normal dentition.  Neck: normal, supple, no masses, no thyromegaly Respiratory: clear to auscultation bilaterally, no wheezing, no crackles. Normal respiratory effort. No accessory muscle use.  Cardiovascular: Regular rate and rhythm, no murmurs / rubs / gallops. No extremity edema. 2+ pedal pulses. No carotid bruits.  Abdomen: no tenderness, no masses palpated. No hepatosplenomegaly. Bowel sounds positive.  Musculoskeletal: no clubbing / cyanosis. No joint deformity upper and lower extremities. Good ROM, no contractures. Normal muscle tone.  Neurologic: CN II-XII grossly intact. Sensation intact, DTR normal. Strength 5/5 in all 4.  Psychiatric: Normal judgment and insight. Alert and oriented x 3. Normal mood.  Skin: no rashes, lesions, ulcers. No induration +  urostomy Wounds: per nursing documentation         Labs on admission:    I have personally reviewed following labs and imaging studies  CBC: Recent Labs  Lab 02/05/21 1216  WBC 20.4*  NEUTROABS 17.0*  HGB 13.6  HCT 41.4  MCV 108.7*  PLT 387   Basic Metabolic Panel: Recent Labs  Lab 02/05/21 1216  NA 137  K 3.7  CL 103   CO2 28  GLUCOSE 130*  BUN 21  CREATININE 1.15  CALCIUM 8.7*   GFR: CrCl cannot be calculated (Unknown ideal weight.). Liver Function Tests: Recent Labs  Lab 02/05/21 1216  AST 14*  ALT 14  ALKPHOS 50  BILITOT 1.5*  PROT 5.7*  ALBUMIN 2.9*    Urine analysis:    Component Value Date/Time   COLORURINE YELLOW 02/05/2021 1359   APPEARANCEUR CLOUDY (A) 02/05/2021 1359   LABSPEC 1.012 02/05/2021 1359   PHURINE 6.0 02/05/2021 1359   GLUCOSEU NEGATIVE 02/05/2021 1359   HGBUR SMALL (A) 02/05/2021 1359   BILIRUBINUR NEGATIVE 02/05/2021 1359   [place] 02/05/2021 1359   PROTEINUR 100 (A) 02/05/2021 1359   NITRITE POSITIVE (A) 02/05/2021 1359   LEUKOCYTESUR LARGE (A) 02/05/2021 1359     Radiologic Exams on Admission:   No results found.  EKG:   Independently reviewed.  Orders placed or performed during the hospital encounter of 02/05/21   EKG 12-Lead   ---------------------------------------------------------------------------------------------------------------------------------------    Assessment / Plan:   Principal Problem:   Sepsis ([place]) Active Problems:   Malignant neoplasm of urinary bladder ([place])   Bladder cancer ([place])   Status post ileal conduit (HCC)   Bone metastasis (HCC)   Sepsis due to urinary tract infection (HCC)  Principal Problem:   Sepsis ([place]) -due to complicated urinary tract infection, from complicated bladder cancer with chronic urostomy -Patient meets the sepsis criteria with leukocytosis 20.4, PR 109... With source of infection urine -bilateral urostomy chronic - lactic acid 0.8, electrolytes within normal limits, BUN 21, creatinine 1.15, - UA: Cloudy, large leukocyte Estrace, nitrite, WBC > 50 -Sepsis protocol initiated, aggressive IV fluid resuscitation, due to penicillin allergies, spectrum anabiotic's ertapenem initiated, will be switched to Azactam -We will follow the blood/urine cultures -Following labs, lactic acid   -Monitor vitals very closely -Patient will be admitted to stepdown unit  History of stage IV bladder - Urotheial cancer with metastases to lung, bone -Initially diagnosed in 2019, status post surgical chemo therapy -Oncologist Dr. Alen Blew -He completed radiation therapy to the pelvis on January 16, 2018 under the care of Dr. Tammi Klippel.  Carboplatin and gemcitabine he is status post 6 cycles of therapy completed on 05/22/2018.  Pembrolizumab 200 mg every  3 weeks started on 06/21/2018.   He completed 8 cycles of therapy and April 2020.  Therapy discontinued because of dermatological toxicities. Status post TURBT He is status post radiation therapy to the right paratracheal lymph node for a total of 50 Gray in 10 fractions completed on April 23, 2020.   6/212022 CT abdomen pelvis revealing cystoprostatectomy with ileal loop diversion. Stable lytic bone metastasis involving the right ischial tuberosity.   Stable large multinodular confluent opacity in right lung apex, which remains indeterminate. Stable 11 mm spiculated nodule in anterior left upper lobe. This does not have typical appearance of pulmonary metastasis, and a primary bronchogenic carcinoma cannot be excluded.   Increased size of 1.4 cm subcarinal lymph node. Metastatic disease cannot be excluded.  MRI of the brain 02/03/2021 revealing 2 hyperintense lesions left basal ganglia  Further work-up per Dr. Alen Blew Continue home medication of [place]   Active Problems:   Malignant neoplasm of urinary bladder ([place]) -as mentioned above follow-up with Dr. Alen Blew as an outpatient   Status post ileal conduit [facility]) -bilateral urostomy present, positive for urine Chronic pain -continue home medication of oxycodone, as needed     Cultures:  -02/05/21 Blood Cultures -02/05/21 urine cultures Antimicrobial: -Meropenem x180 initiating Azactam Iv   Consults called:   None -------------------------------------------------------------------------------------------------------------------------------------------- DVT prophylaxis: SCD/Compression stockings and Heparin SQ Code Status:   Code Status: Full Code   Admission status: Patient will be admitted as Inpatient, with a greater than 2 midnight length of stay. Level of care: Stepdown   Family Communication:  none at bedside  (The above findings and plan of care has been discussed with patient in detail, the patient expressed understanding and agreement of above plan)  --------------------------------------------------------------------------------------------------------------------------------------------------  Disposition Plan: >3 days Status is: Inpatient  Remains inpatient appropriate because:Inpatient level of care appropriate due to severity of illness  Dispo: The patient is from: Home              Anticipated d/c is to: Home              Patient currently is not medically stable to d/c.  As patient is found septic with metastatic bladder cancer needing aggressive IV fluid treatment with IV antibiotics   Difficult to place patient No         ----------------------------------------------------------------------------------------------------------------------------------------------------  Time spent: > than  61  Min.   SIGNED: Deatra James, MD, FHM. [facility],  Pager (Please use [email or web address] to page to text)  If 7PM-7AM, please contact night-coverage [email or web address],  02/05/2021, 3:22 PM

## 2021-02-05 NOTE — ED Triage Notes (Signed)
Fever onset last night

## 2021-02-05 NOTE — Sepsis Progress Note (Signed)
Sepsis protocol being followed by [facility] 

## 2021-02-05 NOTE — ED Notes (Addendum)
Administered Zofran via IV. Pt. Stated it itched. This nurse assessed the pts. Arm. Pt. Has hives going up forarm. This nurse stopped zofran and paged Dr. Roger Shelter. Pt. Denies SHOB at this time.

## 2021-02-05 NOTE — Progress Notes (Signed)
Pharmacy Antibiotic Note  [name] is a 80 y.o. male admitted on 02/05/2021 with UTI.  Pharmacy has been consulted for Meropenem dosing.  Plan: Meropenem 1000 mg IV every 8 hours. Monitor labs, c/s, and patient improvement.     Temp (24hrs), Avg:98.6 F (37 C), Min:98.6 F (37 C), Max:98.6 F (37 C)  Recent Labs  Lab 02/05/21 1216  WBC 20.4*  CREATININE 1.15  LATICACIDVEN 0.8    CrCl cannot be calculated (Unknown ideal weight.).    Allergies  Allergen Reactions   Ciprofloxacin Hives   Cefepime Rash    Antimicrobials this admission: Meropenem 6/23 >>  6/23 Ucx: pending 07/13/20 Ucx: pseudomonas and VRE  Thank you for allowing pharmacy to be a part of this patient's care.  Ramond Craver 02/05/2021 3:06 PM

## 2021-02-05 NOTE — Sepsis Progress Note (Signed)
Sent secure chat to RN about LA that is due

## 2021-02-05 NOTE — ED Notes (Signed)
ED TO INPATIENT HANDOFF REPORT  ED Nurse Name and Phone #:   S Name/Age/Gender [name] 80 y.o. male Room/Bed: APA16A/APA16A  Code Status   Code Status: Full Code  Home/SNF/Other Home Patient oriented to: self, place, time and situation Is this baseline? Yes   Triage Complete: Triage complete  Chief Complaint Sepsis [facility]) [A41.9]  Triage Note Fever onset last night  Took tylenol prior to arrival per spouse    Allergies Allergies  Allergen Reactions  . Ciprofloxacin Hives  . Cefepime Rash  . Zofran [Ondansetron] Rash    Level of Care/Admitting Diagnosis ED Disposition    ED Disposition  Admit   Condition  --   [place]: [facility] [[id number]]  Level of Care: Stepdown [14]  Covid Evaluation: Asymptomatic Screening Protocol (No Symptoms)  Diagnosis: Sepsis [facility]) [[id number]]  Admitting Physician: Acquanetta Sit  Attending Physician: Acquanetta Sit  Estimated length of stay: 3 - 4 days  Certification:: I certify this patient will need inpatient services for at least 2 midnights         B Medical/Surgery History Past Medical History:  Diagnosis Date  . Bladder cancer ([place])   . BPH (benign prostatic hypertrophy)   . Dysuria   . GERD (gastroesophageal reflux disease)   . History of adenomatous polyp of colon    tubular adenoma 06/ 2018  . History of bladder cancer 12/2014   urologist-  dr [name]--  dx High Grade TCC without stromal invasion s/p TURBT and intravesical BCG tx/  recurrent 07/2015 (pTa) TCC  repear intravesical BCG tx   . History of hiatal hernia   . History of urethral stricture   . Nocturia    Past Surgical History:  Procedure Laterality Date  . CARDIAC CATHETERIZATION  1997   normal coronary arteries  . CARDIOVASCULAR STRESS TEST  12-14-2005   Low risk perfusion study/  very small scar in the inferior septum from mid ventricle to apex,  no ischemia/  mild inferior septal  hypokinesis/  ef 58%  . CATARACT EXTRACTION W/ INTRAOCULAR LENS  IMPLANT, BILATERAL  2011  . COLONOSCOPY  last one 06/ 2018  . CYSTOSCOPY WITH BIOPSY N/A 08/12/2015   Procedure: CYSTOSCOPY WITH BIOPSY AND FULGERATION;  Surgeon: [name], MD;  Location[facility];  Service: Urology;  Laterality: N/A;  . CYSTOSCOPY WITH INJECTION N/A 06/29/2017   Procedure: CYSTOSCOPY WITH INJECTION OF INDOCYANINE GREEN DYE;  Surgeon: Alexis Frock, MD;  Location: [facility];  Service: Urology;  Laterality: N/A;  . CYSTOSCOPY WITH URETHRAL DILATATION N/A 03/21/2017   Procedure: CYSTOSCOPY;  Surgeon: [name], MD;  Location[facility];  Service: Urology;  Laterality: N/A;  . ESOPHAGOGASTRODUODENOSCOPY  12-05-2014  . IR IMAGING GUIDED PORT INSERTION  02/07/2018  . IR REMOVAL TUN ACCESS W/ PORT W/O FL MOD SED  04/14/2018  . TRANSTHORACIC ECHOCARDIOGRAM  01-31-2008   nomral LVF,  ef 55-65%/  mild pulmonic stenosis without regurg.,  peak transpulomonic valve gradient 42mmHg  . TRANSURETHRAL RESECTION OF BLADDER TUMOR N/A 12/31/2014   Procedure: TRANSURETHRAL RESECTION OF BLADDER TUMOR (TURBT);  Surgeon: Lowella Bandy, MD;  Location[facility];  Service: Urology;  Laterality: N/A;  . TRANSURETHRAL RESECTION OF BLADDER TUMOR N/A 03/21/2017   Procedure: POSSIBLE TRANSURETHRAL RESECTION OF BLADDER TUMOR (TURBT);  Surgeon: [name], MD;  Location: [facility];  Service: Urology;  Laterality: N/A;     A IV  Location/Drains/Wounds Patient Lines/Drains/Airways Status    Active Line/Drains/Airways    Name Placement date Placement time Site Days   Peripheral IV 02/05/21 20 G 1" Right Forearm 02/05/21  1211  Forearm  less than 1   Urostomy Ureterostomy right RLQ --  --  RLQ  --   Urostomy RLQ 07/13/20  2300  RLQ  207          Intake/Output Last 24 hours  Intake/Output Summary (Last 24 hours) at 02/05/2021 1919 Last data filed at  02/05/2021 1800 Gross per 24 hour  Intake 356 ml  Output 550 ml  Net -194 ml    Labs/Imaging Results for orders placed or performed during the hospital encounter of 02/05/21 (from the past 48 hour(s))  CBC with Differential/Platelet     Status: Abnormal   Collection Time: 02/05/21 12:16 PM  Result Value Ref Range   WBC 20.4 (H) 4.0 - 10.5 K/uL   RBC 3.81 (L) 4.22 - 5.81 MIL/uL   Hemoglobin 13.6 13.0 - 17.0 g/dL   HCT 41.4 39.0 - 52.0 %   MCV 108.7 (H) 80.0 - 100.0 fL   MCH 35.7 (H) 26.0 - 34.0 pg   MCHC 32.9 30.0 - 36.0 g/dL   RDW 13.2 11.5 - 15.5 %   Platelets 182 150 - 400 K/uL   nRBC 0.0 0.0 - 0.2 %   Neutrophils Relative % 84 %   Neutro Abs 17.0 (H) 1.7 - 7.7 K/uL   Lymphocytes Relative 2 %   Lymphs Abs 0.5 (L) 0.7 - 4.0 K/uL   Monocytes Relative 13 %   Monocytes Absolute 2.7 (H) 0.1 - 1.0 K/uL   Eosinophils Relative 0 %   Eosinophils Absolute 0.1 0.0 - 0.5 K/uL   Basophils Relative 0 %   Basophils Absolute 0.1 0.0 - 0.1 K/uL   Immature Granulocytes 1 %   Abs Immature Granulocytes 0.11 (H) 0.00 - 0.07 K/uL    Comment: Performed at [facility], [address], [place], [place] [address]  Comprehensive metabolic panel     Status: Abnormal   Collection Time: 02/05/21 12:16 PM  Result Value Ref Range   Sodium 137 135 - 145 mmol/L   Potassium 3.7 3.5 - 5.1 mmol/L   Chloride 103 98 - 111 mmol/L   CO2 28 22 - 32 mmol/L   Glucose, Bld 130 (H) 70 - 99 mg/dL    Comment: Glucose reference range applies only to samples taken after fasting for at least 8 hours.   BUN 21 8 - 23 mg/dL   Creatinine, Ser 1.15 0.61 - 1.24 mg/dL   Calcium 8.7 (L) 8.9 - 10.3 mg/dL   Total Protein 5.7 (L) 6.5 - 8.1 g/dL   Albumin 2.9 (L) 3.5 - 5.0 g/dL   AST 14 (L) 15 - 41 U/L   ALT 14 0 - 44 U/L   Alkaline Phosphatase 50 38 - 126 U/L   Total Bilirubin 1.5 (H) 0.3 - 1.2 mg/dL   GFR, Estimated >60 >60 mL/min    Comment: (NOTE) Calculated using the CKD-EPI Creatinine Equation (2021)    Anion gap  6 5 - 15    Comment: Performed at [facility], [address] [place], [place] [address]  Lactic acid, plasma     Status: None   Collection Time: 02/05/21 12:16 PM  Result Value Ref Range   Lactic Acid, Venous 0.8 0.5 - 1.9 mmol/L    Comment: Performed at [facility], [address], [place], [place] [address]  Brain natriuretic peptide     Status: None   Collection Time: 02/05/21 12:16 PM  Result Value Ref Range   B Natriuretic Peptide 37.0 0.0 - 100.0 pg/mL    Comment: Performed at [facility], [address], [place], [place] [address]  Protime-INR     Status: None   Collection Time: 02/05/21 12:16 PM  Result Value Ref Range   Prothrombin Time 14.4 11.4 - 15.2 seconds   INR 1.1 0.8 - 1.2    Comment: (NOTE) INR goal varies based on device and disease states. Performed at [facility], [address] [place], [place] [address]   APTT     Status: None   Collection Time: 02/05/21 12:16 PM  Result Value Ref Range   aPTT 29 24 - 36 seconds    Comment: Performed at [facility], [address], [place], [place] [address]  Urinalysis, Routine w reflex microscopic Urine, Clean Catch     Status: Abnormal   Collection Time: 02/05/21  1:59 PM  Result Value Ref Range   Color, Urine YELLOW YELLOW   APPearance CLOUDY (A) CLEAR   Specific Gravity, Urine 1.012 1.005 - 1.030   pH 6.0 5.0 - 8.0   Glucose, UA NEGATIVE NEGATIVE mg/dL   Hgb urine dipstick SMALL (A) NEGATIVE   Bilirubin Urine NEGATIVE NEGATIVE   Ketones, ur NEGATIVE NEGATIVE mg/dL   Protein, ur 100 (A) NEGATIVE mg/dL   Nitrite POSITIVE (A) NEGATIVE   Leukocytes,Ua LARGE (A) NEGATIVE   RBC / HPF 6-10 0 - 5 RBC/hpf   WBC, UA >50 (H) 0 - 5 WBC/hpf   Bacteria, UA RARE (A) NONE SEEN   Squamous Epithelial / LPF 0-5 0 - 5   WBC Clumps PRESENT    Mucus PRESENT    Budding Yeast PRESENT    Non Squamous Epithelial 0-5 (A) NONE SEEN    Comment: Performed at [facility], [address] [place], [place] [address]  Culture, blood  (single)     Status: None (Preliminary result)   Collection Time: 02/05/21  2:12 PM   Specimen: BLOOD  Result Value Ref Range   Specimen Description BLOOD LEFT HAND    Special Requests      BOTTLES DRAWN AEROBIC AND ANAEROBIC Blood Culture adequate volume Performed at [facility], [address] [place], [place] [address]    Culture PENDING    Report Status PENDING   Culture, blood (x 2)     Status: None (Preliminary result)   Collection Time: 02/05/21  3:47 PM   Specimen: BLOOD RIGHT ARM  Result Value Ref Range   Specimen Description      BLOOD RIGHT ARM BOTTLES DRAWN AEROBIC AND ANAEROBIC   Special Requests      Blood Culture adequate volume Performed at [facility], [address], [place], [place] [address]    Culture PENDING    Report Status PENDING   Lactic acid, plasma     Status: Abnormal   Collection Time: 02/05/21  3:47 PM  Result Value Ref Range   Lactic Acid, Venous 2.2 (HH) 0.5 - 1.9 mmol/L    Comment: CRITICAL RESULT CALLED TO, READ BACK BY AND VERIFIED WITH: OAKLEY,B ON 02/05/21 AT 1645 BY LOY,C Performed at [facility], [address] [place], [place] [address]    No results found.  Pending Labs [employer] (From admission, onward)    Start     Ordered   02/06/21 0500  Protime-INR  Tomorrow morning,   R  02/05/21 1505   02/06/21 0500  CBC with Differential  Daily,   R      02/05/21 1511   02/06/21 0500  Comprehensive metabolic panel  Daily,   R      02/05/21 1511   02/05/21 1507  Urine culture  ONCE - STAT,   STAT        02/05/21 1509   02/05/21 1506  Lactic acid, plasma  STAT Now then every 2 hours,   STAT      02/05/21 1509          Vitals/Pain Today's Vitals   02/05/21 1800 02/05/21 1806 02/05/21 1830 02/05/21 1907  BP: (!) 148/72  125/71   Pulse: (!) 121  (!) 118   Resp: (!) 26  (!) 25   Temp:  (!) 101.5 F (38.6 C)  (!) 102.9 F (39.4 C)  TempSrc:  Rectal  Rectal  SpO2: 98%  96%   PainSc:        Isolation  Precautions No active isolations  Medications Medications  lactated ringers infusion (has no administration in time range)  sodium chloride flush (NS) 0.9 % injection 3 mL (3 mLs Intravenous Given 02/05/21 1750)  sodium chloride flush (NS) 0.9 % injection 3 mL (3 mLs Intravenous Given 02/05/21 1749)  acetaminophen (TYLENOL) tablet 650 mg ( Oral See Alternative 02/05/21 1650)    Or  acetaminophen (TYLENOL) suppository 650 mg (650 mg Rectal Given 02/05/21 1650)  oxyCODONE (Oxy IR/ROXICODONE) immediate release tablet 5 mg (has no administration in time range)  HYDROmorphone (DILAUDID) injection 0.5-1 mg (1 mg Intravenous Given 02/05/21 1731)  traZODone (DESYREL) tablet 25 mg (has no administration in time range)  senna-docusate (Senokot-S) tablet 1 tablet (has no administration in time range)  bisacodyl (DULCOLAX) EC tablet 5 mg (has no administration in time range)  magnesium citrate solution 1 Bottle (has no administration in time range)  ipratropium (ATROVENT) nebulizer solution 0.5 mg (has no administration in time range)  levalbuterol (XOPENEX) nebulizer solution 0.63 mg (has no administration in time range)  hydrALAZINE (APRESOLINE) injection 10 mg (has no administration in time range)  heparin injection 5,000 Units (5,000 Units Subcutaneous Given 02/05/21 1908)  meropenem (MERREM) 1 g in sodium chloride 0.9 % 100 mL IVPB (has no administration in time range)  lactated ringers infusion (has no administration in time range)  HYDROmorphone (DILAUDID) tablet 4 mg (has no administration in time range)  cyanocobalamin ((VITAMIN B-12)) injection 1,000 mcg (1,000 mcg Intramuscular Given 02/05/21 1901)  omalizumab Arvid Right) injection 300 mg ( Subcutaneous Canceled Entry 02/05/21 1547)  diphenhydrAMINE (BENADRYL) injection 12.5 mg (12.5 mg Intravenous Given 02/05/21 1751)  acetaminophen (TYLENOL) suppository 325 mg (has no administration in time range)  sodium chloride 0.9 % bolus 250 mL (0 mLs  Intravenous Stopped 02/05/21 1344)  sodium chloride 0.9 % bolus 2,000 mL (2,000 mLs Intravenous New Bag/Given 02/05/21 1546)  meropenem (MERREM) 1 g in sodium chloride 0.9 % 100 mL IVPB (0 g Intravenous Stopped 02/05/21 1537)    Mobility walks Low fall risk   Focused Assessments    R Recommendations: See Admitting Provider Note  Report given to:   Additional Notes:  '

## 2021-02-05 NOTE — ED Triage Notes (Signed)
Took tylenol prior to arrival per spouse

## 2021-02-05 NOTE — ED Notes (Signed)
Date and time results received: 02/05/21 4:47 PM    Test: Lactic acid Critical Value: 2.2  Name of Provider Notified: Dr. Roger Shelter  Orders Received? Or Actions Taken? See orders

## 2021-02-05 NOTE — Sepsis Progress Note (Signed)
Elink Code Sepsis Completion Note:  Pt had BC drawn and ABX administered. LA went from 0.8 to 2.2, but was not repeated despite Elink RN requesting a repeat because it was over 2.0. Admitted to [facility] unit for continued monitoring.  [place] PM

## 2021-02-06 ENCOUNTER — Encounter (HOSPITAL_COMMUNITY): Payer: Self-pay | Admitting: Family Medicine

## 2021-02-06 ENCOUNTER — Ambulatory Visit: Payer: Self-pay

## 2021-02-06 DIAGNOSIS — Z515 Encounter for palliative care: Secondary | ICD-10-CM

## 2021-02-06 DIAGNOSIS — Z936 Other artificial openings of urinary tract status: Secondary | ICD-10-CM

## 2021-02-06 DIAGNOSIS — C7951 Secondary malignant neoplasm of bone: Secondary | ICD-10-CM

## 2021-02-06 DIAGNOSIS — Z7189 Other specified counseling: Secondary | ICD-10-CM

## 2021-02-06 LAB — CBC WITH DIFFERENTIAL/PLATELET
Abs Immature Granulocytes: 0.16 10*3/uL — ABNORMAL HIGH (ref 0.00–0.07)
Basophils Absolute: 0.1 10*3/uL (ref 0.0–0.1)
Basophils Relative: 0 %
Eosinophils Absolute: 0 10*3/uL (ref 0.0–0.5)
Eosinophils Relative: 0 %
HCT: 36.8 % — ABNORMAL LOW (ref 39.0–52.0)
Hemoglobin: 11.8 g/dL — ABNORMAL LOW (ref 13.0–17.0)
Immature Granulocytes: 1 %
Lymphocytes Relative: 3 %
Lymphs Abs: 0.6 10*3/uL — ABNORMAL LOW (ref 0.7–4.0)
MCH: 36.2 pg — ABNORMAL HIGH (ref 26.0–34.0)
MCHC: 32.1 g/dL (ref 30.0–36.0)
MCV: 112.9 fL — ABNORMAL HIGH (ref 80.0–100.0)
Monocytes Absolute: 1.7 10*3/uL — ABNORMAL HIGH (ref 0.1–1.0)
Monocytes Relative: 10 %
Neutro Abs: 14.7 10*3/uL — ABNORMAL HIGH (ref 1.7–7.7)
Neutrophils Relative %: 86 %
Platelets: 140 10*3/uL — ABNORMAL LOW (ref 150–400)
RBC: 3.26 MIL/uL — ABNORMAL LOW (ref 4.22–5.81)
RDW: 13.1 % (ref 11.5–15.5)
WBC: 17.2 10*3/uL — ABNORMAL HIGH (ref 4.0–10.5)
nRBC: 0 % (ref 0.0–0.2)

## 2021-02-06 LAB — COMPREHENSIVE METABOLIC PANEL
ALT: 12 U/L (ref 0–44)
AST: 12 U/L — ABNORMAL LOW (ref 15–41)
Albumin: 2.5 g/dL — ABNORMAL LOW (ref 3.5–5.0)
Alkaline Phosphatase: 39 U/L (ref 38–126)
Anion gap: 4 — ABNORMAL LOW (ref 5–15)
BUN: 18 mg/dL (ref 8–23)
CO2: 28 mmol/L (ref 22–32)
Calcium: 8.2 mg/dL — ABNORMAL LOW (ref 8.9–10.3)
Chloride: 109 mmol/L (ref 98–111)
Creatinine, Ser: 1.02 mg/dL (ref 0.61–1.24)
GFR, Estimated: >60 mL/min (ref >60)
Glucose, Bld: 106 mg/dL — ABNORMAL HIGH (ref 70–99)
Potassium: 4 mmol/L (ref 3.5–5.1)
Sodium: 141 mmol/L (ref 135–145)
Total Bilirubin: 1.2 mg/dL (ref 0.3–1.2)
Total Protein: 4.8 g/dL — ABNORMAL LOW (ref 6.5–8.1)

## 2021-02-06 LAB — PROTIME-INR
INR: 1.3 — ABNORMAL HIGH (ref 0.8–1.2)
Prothrombin Time: 16.1 seconds — ABNORMAL HIGH (ref 11.4–15.2)

## 2021-02-06 LAB — LACTIC ACID, PLASMA: Lactic Acid, Venous: 1.2 mmol/L (ref 0.5–1.9)

## 2021-02-06 LAB — MRSA NEXT GEN BY PCR, NASAL: MRSA by PCR Next Gen: NOT DETECTED

## 2021-02-06 LAB — GLUCOSE, CAPILLARY: Glucose-Capillary: 98 mg/dL (ref 70–99)

## 2021-02-06 MED ORDER — CHLORHEXIDINE GLUCONATE CLOTH 2 % EX PADS
6.0000 | MEDICATED_PAD | Freq: Every day | CUTANEOUS | Status: DC
  Administered 2021-02-06 – 2021-02-07 (×2): 6 via TOPICAL

## 2021-02-06 MED ORDER — FLORANEX PO PACK
1.0000 g | PACK | Freq: Three times a day (TID) | ORAL | Status: DC
  Administered 2021-02-06 – 2021-02-08 (×6): 1 g via ORAL
  Filled 2021-02-06 (×10): qty 1

## 2021-02-06 NOTE — Progress Notes (Signed)
PROGRESS NOTE    Patient: [name]                            PCP: [name], MD                    DOB: [birth year]            DOA: 02/05/2021 ION:[id number]             DOS: 02/06/2021, 11:42 AM   LOS: 1 day   Date of Service: The patient was seen and examined on 02/06/2021  Subjective:   The patient was seen and examined this morning. Hemodynamically stable, much more awake alert this morning  Patient had a reaction to Zofran yesterday evening subsequently was discontinued, He apparently had rash but no respiratory compromise  For some reason patient was told by nursing staff to have a swallowing difficulty was kept n.p.o. overnight for evaluation... The patient and his wife states that he does not have any issues with swallowing.  Brief Narrative:    ADIS STURGILL is a 80 y.o. male with medical history significant of stage IV metastatic bladder cancer, status post radiation, chemotherapy, status post post ilial conduit, chronic recurrent UTIs, chronic pain, BPH, GERD, presented with fever, chills. Reports feels like another bladder infection.      Patient Denies having: Cough, SOB, Chest Pain, Abd pain, N/V/D, headache, dizziness, lightheadedness,  Dysuria, Joint pain, rash, open wounds   ED Course:   Vitals: Temp 98.6 pulse 109 respiratory rate 14-20, BP 105/65, satting 94% on room air, Abnormal labs; WBC 20.4, lactic acid 0.8, electrolytes within normal limits, BUN 21, creatinine 1.15,   UA: Cloudy, large leukocyte Estrace, nitrite, WBC > 50 CT abdomen pelvis -pending   CT abdomen pelvis chest without contrast reviewed from 02/03/21    Assessment & Plan:   Principal Problem:   Sepsis ([place]) Active Problems:   Malignant neoplasm of urinary bladder (HCC)   Bladder cancer (HCC)   Status post ileal conduit (HCC)   Bone metastasis (HCC)   Sepsis due to urinary tract infection (HCC)   Principal Problem:    Sepsis ([place]) -due to complicated urinary tract  infection, from complicated bladder cancer with chronic urostomy -Much improved sepsis pathophysiology, afebrile, normotensive, improved leukocytosis,  -Patient meets the sepsis criteria with leukocytosis 20.4, PR 109... With source of infection urine -bilateral urostomy chronic - lactic acid 0.8, electrolytes within normal limits, BUN 21, creatinine 1.15, - UA: Cloudy, large leukocyte Estrace, nitrite, WBC > 50 -Sepsis protocol initiated, aggressive IV fluid resuscitation, due to penicillin allergies, spectrum anabiotic's ertapenem initiated, will be switched to Azactam -Blood cultures x2 >> -Urine cultures >>  -Following labs, lactic acid 1.2 today -Monitor vitals very closely -Patient will be admitted to stepdown unit   History of stage IV bladder - Urotheial cancer with metastases to lung, bone Patient is to follow-up with primary oncologist  -Initially diagnosed in 2019, status post surgical chemo therapy -Oncologist Dr. Alen Blew -He completed radiation therapy to the pelvis on January 16, 2018 under the care of Dr. Tammi Klippel.  Carboplatin and gemcitabine he is status post 6 cycles of therapy completed on 05/22/2018.  Pembrolizumab 200 mg every 3 weeks started on 06/21/2018.   He completed 8 cycles of therapy and April 2020.  Therapy discontinued because of dermatological toxicities. Status post TURBT He is status post radiation therapy to the right paratracheal lymph node for  a total of 50 Gray in 10 fractions completed on April 23, 2020.   [id number] CT abdomen pelvis revealing cystoprostatectomy with ileal loop diversion. Stable lytic bone metastasis involving the right ischial tuberosity.   Stable large multinodular confluent opacity in right lung apex, which remains indeterminate. Stable 11 mm spiculated nodule in anterior left upper lobe. This does not have typical appearance of pulmonary metastasis, and a primary bronchogenic carcinoma cannot be excluded.   Increased size of 1.4  cm subcarinal lymph node. Metastatic disease cannot be excluded.   MRI of the brain 02/03/2021 revealing 2 hyperintense lesions left basal ganglia   Further work-up per Dr. Alen Blew Continue home medication of [place] Stable  Active Problems:   Malignant neoplasm of urinary bladder ([place]) -as mentioned above follow-up with Dr. Alen Blew as an outpatient   Status post ileal conduit [facility]) -bilateral urostomy present, positive for urine Chronic pain -continue home medication of oxycodone, as needed Remained stable   Cultures: -02/05/21 Blood Cultures -02/05/21 urine cultures Antimicrobial: -Meropenem x180 initiating Azactam Iv   Consults called:  None -------------------------------------------------------------------------------------------------------------------------------------------- DVT prophylaxis: SCD/Compression stockings and Heparin SQ Code Status:   Code Status: Full Code     Admission status: Patient will be admitted as Inpatient, with a greater than 2 midnight length of stay. Level of care: Stepdown     Family Communication:  none at bedside (The above findings and plan of care has been discussed with patient in detail, the patient expressed understanding and agreement of above plan) --------------------------------------------------------------------------------------------------------------------------------------------------   Disposition Plan: >3 days Status is: Inpatient   Remains inpatient appropriate because:Inpatient level of care appropriate due to severity of illness   Dispo: The patient is from: Home              Anticipated d/c is to: Home in 2-3 days               Patient currently is not medically stable to d/c.  As patient is found septic with metastatic bladder cancer needing aggressive IV fluid treatment with IV antibiotics              Difficult to place patient No          Level of care: Stepdown   Procedures:   No admission procedures  for hospital encounter.    Antimicrobials:  Anti-infectives (From admission, onward)    Start     Dose/Rate Route Frequency Ordered Stop   02/05/21 2300  meropenem (MERREM) 1 g in sodium chloride 0.9 % 100 mL IVPB        1 g 200 mL/hr over 30 Minutes Intravenous Every 8 hours 02/05/21 1507     02/05/21 1515  aztreonam (AZACTAM) 2 g in sodium chloride 0.9 % 100 mL IVPB  Status:  Discontinued        2 g 200 mL/hr over 30 Minutes Intravenous  Once 02/05/21 1509 02/05/21 1539   02/05/21 1430  meropenem (MERREM) 1 g in sodium chloride 0.9 % 100 mL IVPB        1 g 200 mL/hr over 30 Minutes Intravenous  Once 02/05/21 1428 02/05/21 1537        Medication:   Chlorhexidine Gluconate Cloth  6 each Topical Daily   cyanocobalamin  1,000 mcg Intramuscular Q30 days   heparin  5,000 Units Subcutaneous Q8H   lactobacillus  1 g Oral TID WC   omalizumab  300 mg Subcutaneous Q14 Days   sodium chloride flush  3 mL Intravenous Q12H  sodium chloride flush  3 mL Intravenous Q12H    acetaminophen **OR** acetaminophen, bisacodyl, diphenhydrAMINE, hydrALAZINE, HYDROmorphone (DILAUDID) injection, HYDROmorphone, ipratropium, levalbuterol, magnesium citrate, oxyCODONE, senna-docusate, traZODone   Objective:   Vitals:   02/06/21 0800 02/06/21 0900 02/06/21 1000 02/06/21 1100  BP: 121/84  (!) 131/59 (!) 120/54  Pulse: 77 82 85 87  Resp: 15 16 (!) 23 19  Temp:      TempSrc:      SpO2: 100% 100% (!) 83% 96%  Weight:      Height:        Intake/Output Summary (Last 24 hours) at 02/06/2021 1142 Last data filed at 02/06/2021 [id number] Gross per 24 hour  Intake 2104.04 ml  Output 2300 ml  Net -195.96 ml   Filed Weights   02/05/21 2229  Weight: 92.1 kg     Examination:   Physical Exam  Constitution:  Alert, cooperative, no distress,  Appears calm and comfortable  Psychiatric: Normal and stable mood and affect, cognition intact,   HEENT: Normocephalic, PERRL, otherwise with in Normal limits   Chest:Chest symmetric Cardio vascular:  S1/S2, RRR, No murmure, No Rubs or Gallops  pulmonary: Clear to auscultation bilaterally, respirations unlabored, negative wheezes / crackles Abdomen: Soft, non-tender, non-distended, bowel sounds,no masses, no organomegaly Muscular skeletal: Limited exam - in bed, able to move all 4 extremities, Normal strength,  Neuro: CNII-XII intact. , normal motor and sensation, reflexes intact  Extremities: No pitting edema lower extremities, +2 pulses  Skin: Dry, warm to touch, negative for any Rashes, No open wounds Right lower abdomen nephrostomy tube in place, mildly cloudy urine noted Wounds: per nursing documentation     ------------------------------------------------------------------------------------------------------------------------------------------    LABs:  CBC Latest Ref Rng & Units 02/06/2021 02/05/2021 01/05/2021  WBC 4.0 - 10.5 K/uL 17.2(H) 20.4(H) 11.1(H)  Hemoglobin 13.0 - 17.0 g/dL 11.8(L) 13.6 14.5  Hematocrit 39.0 - 52.0 % 36.8(L) 41.4 44.0  Platelets 150 - 400 K/uL 140(L) 182 183   CMP Latest Ref Rng & Units 02/06/2021 02/05/2021 01/05/2021  Glucose 70 - 99 mg/dL 106(H) 130(H) 128(H)  BUN 8 - 23 mg/dL 18 21 12   Creatinine 0.61 - 1.24 mg/dL 1.02 1.15 0.91  Sodium 135 - 145 mmol/L 141 137 142  Potassium 3.5 - 5.1 mmol/L 4.0 3.7 4.0  Chloride 98 - 111 mmol/L 109 103 105  CO2 22 - 32 mmol/L 28 28 29   Calcium 8.9 - 10.3 mg/dL 8.2(L) 8.7(L) 9.3  Total Protein 6.5 - 8.1 g/dL 4.8(L) 5.7(L) 5.8(L)  Total Bilirubin 0.3 - 1.2 mg/dL 1.2 1.5(H) 0.5  Alkaline Phos 38 - 126 U/L 39 50 62  AST 15 - 41 U/L 12(L) 14(L) 16  ALT 0 - 44 U/L 12 14 26        Micro Results Recent Results (from the past 240 hour(s))  Culture, blood (single)     Status: None (Preliminary result)   Collection Time: 02/05/21  2:12 PM   Specimen: BLOOD  Result Value Ref Range Status   Specimen Description BLOOD LEFT HAND  Final   Special Requests   Final     BOTTLES DRAWN AEROBIC AND ANAEROBIC Blood Culture adequate volume   Culture   Final    NO GROWTH < 24 HOURS Performed at [facility], [address], [place], [place] [address]    Report Status PENDING  Incomplete  Culture, blood (x 2)     Status: None (Preliminary result)   Collection Time: 02/05/21  3:47 PM   Specimen: BLOOD RIGHT  ARM  Result Value Ref Range Status   Specimen Description   Final    BLOOD RIGHT ARM BOTTLES DRAWN AEROBIC AND ANAEROBIC   Special Requests Blood Culture adequate volume  Final   Culture   Final    NO GROWTH < 24 HOURS Performed at [facility], [address] [place], [place] [address]    Report Status PENDING  Incomplete  MRSA Next Gen by PCR, Nasal     Status: None   Collection Time: 02/05/21 10:44 PM   Specimen: Nasal Mucosa; Nasal Swab  Result Value Ref Range Status   MRSA by PCR Next Gen NOT DETECTED NOT DETECTED Final    Comment: (NOTE) The GeneXpert MRSA Assay (FDA approved for NASAL specimens only), is one component of a comprehensive MRSA colonization surveillance program. It is not intended to diagnose MRSA infection nor to guide or monitor treatment for MRSA infections. Test performance is not FDA approved in patients less than 64 years old. Performed at [facility], [address] [place], [place] [address]     Radiology Reports CT ABDOMEN PELVIS WO CONTRAST  Result Date: 02/03/2021 CLINICAL DATA:  Metastatic bladder carcinoma. Previous cystoprostatectomy, chemotherapy, and radiation therapy. EXAM: CT CHEST, ABDOMEN AND PELVIS WITHOUT CONTRAST TECHNIQUE: Multidetector CT imaging of the chest, abdomen and pelvis was performed following the standard protocol without IV contrast. COMPARISON:  Chest CT on 10/15/2019 and AP CT on 06/16/2020 FINDINGS: CT CHEST FINDINGS Cardiovascular: No acute findings. Aortic and coronary atherosclerotic calcification noted. Mediastinum/Lymph Nodes: 1.4 cm subcarinal lymph node is seen, increased from 7 mm on  prior study. No other pathologically enlarged lymph nodes identified on this unenhanced exam. Lungs/Pleura: Near complete resolution of right pleural effusion since prior study. A large irregular multinodular confluent opacity is seen in the right lung apex, with largest area measuring 2.7 x 2.7 cm on image 20/3. This remains unchanged in size and appearance since prior study. Previously noted sub-solid nodule in the posterior perihilar right upper lobe has decreased in size, consistent with resolving inflammatory or infectious etiology. A spiculated nodule is also seen in the anterior left upper lobe measuring 11 x 8 mm, also without change since prior exam. No new or enlarging pulmonary nodules or masses identified. Musculoskeletal:  No suspicious bone lesions identified. CT ABDOMEN AND PELVIS FINDINGS Hepatobiliary: No masses visualized on this unenhanced exam. A few small fluid attenuation hepatic cysts remains stable. Gallbladder is unremarkable. No evidence of biliary ductal dilatation. Pancreas: No mass or inflammatory changes identified on this unenhanced exam. Spleen:  Within normal limits in size. Adrenals/Urinary Tract: Stable postop changes from cystoprostatectomy with ileal loop diversion. No evidence of urolithiasis or hydronephrosis. Stomach/Bowel: No evidence of obstruction, inflammatory process, or abnormal fluid collections. Normal appendix visualized. Moderate colonic stool burden is unchanged. Vascular/Lymphatic: No pathologically enlarged lymph nodes identified. No abdominal aortic aneurysm. Aortic atherosclerotic calcification noted. Reproductive: Stable postop changes from cystoprostatectomy. No evidence of recurrent mass. Other:  None. Musculoskeletal: Lytic mass is again seen involving the right ischial tuberosity which is stable since previous study. No other suspicious bone lesions identified. IMPRESSION: Prior cystoprostatectomy with ileal loop diversion. Stable lytic bone metastasis  involving the right ischial tuberosity. No other sites of abdominal or pelvic metastatic disease identified. Stable large multinodular confluent opacity in right lung apex, which remains indeterminate. Differential diagnosis includes scarring or post radiation changes, with malignancy considered less likely. Continued attention on follow-up imaging is recommended. Stable 11 mm spiculated nodule in anterior left upper lobe. This  does not have typical appearance of pulmonary metastasis, and a primary bronchogenic carcinoma cannot be excluded. Increased size of 1.4 cm subcarinal lymph node. Metastatic disease cannot be excluded. Aortic Atherosclerosis (ICD10-I70.0). Electronically Signed   By: Marlaine Hind M.D.   On: 02/03/2021 16:17   CT Chest Wo Contrast  Result Date: 02/03/2021 CLINICAL DATA:  Metastatic bladder carcinoma. Previous cystoprostatectomy, chemotherapy, and radiation therapy. EXAM: CT CHEST, ABDOMEN AND PELVIS WITHOUT CONTRAST TECHNIQUE: Multidetector CT imaging of the chest, abdomen and pelvis was performed following the standard protocol without IV contrast. COMPARISON:  Chest CT on 10/15/2019 and AP CT on 06/16/2020 FINDINGS: CT CHEST FINDINGS Cardiovascular: No acute findings. Aortic and coronary atherosclerotic calcification noted. Mediastinum/Lymph Nodes: 1.4 cm subcarinal lymph node is seen, increased from 7 mm on prior study. No other pathologically enlarged lymph nodes identified on this unenhanced exam. Lungs/Pleura: Near complete resolution of right pleural effusion since prior study. A large irregular multinodular confluent opacity is seen in the right lung apex, with largest area measuring 2.7 x 2.7 cm on image 20/3. This remains unchanged in size and appearance since prior study. Previously noted sub-solid nodule in the posterior perihilar right upper lobe has decreased in size, consistent with resolving inflammatory or infectious etiology. A spiculated nodule is also seen in the  anterior left upper lobe measuring 11 x 8 mm, also without change since prior exam. No new or enlarging pulmonary nodules or masses identified. Musculoskeletal:  No suspicious bone lesions identified. CT ABDOMEN AND PELVIS FINDINGS Hepatobiliary: No masses visualized on this unenhanced exam. A few small fluid attenuation hepatic cysts remains stable. Gallbladder is unremarkable. No evidence of biliary ductal dilatation. Pancreas: No mass or inflammatory changes identified on this unenhanced exam. Spleen:  Within normal limits in size. Adrenals/Urinary Tract: Stable postop changes from cystoprostatectomy with ileal loop diversion. No evidence of urolithiasis or hydronephrosis. Stomach/Bowel: No evidence of obstruction, inflammatory process, or abnormal fluid collections. Normal appendix visualized. Moderate colonic stool burden is unchanged. Vascular/Lymphatic: No pathologically enlarged lymph nodes identified. No abdominal aortic aneurysm. Aortic atherosclerotic calcification noted. Reproductive: Stable postop changes from cystoprostatectomy. No evidence of recurrent mass. Other:  None. Musculoskeletal: Lytic mass is again seen involving the right ischial tuberosity which is stable since previous study. No other suspicious bone lesions identified. 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 No other sites of abdominal or pelvic metastatic disease identified. Stable large multinodular confluent opacity in right lung apex, which remains indeterminate. Differential diagnosis includes scarring or post radiation changes, with malignancy considered less likely. Continued attention on follow-up imaging is recommended. Stable 11 mm spiculated nodule in anterior left upper lobe. This does not have typical appearance of pulmonary metastasis, and a primary bronchogenic carcinoma cannot be excluded. Increased size of 1.4 cm subcarinal lymph  node. Metastatic disease cannot be excluded. Aortic Atherosclerosis (ICD10-I70.0). Electronically Signed   By: Marlaine Hind M.D.   On: 02/03/2021 16:17   MR Brain W Wo Contrast  Result Date: 02/04/2021 CLINICAL DATA:  Bladder cancer. Assess treatment response. Of EXAM: MRI HEAD WITHOUT AND WITH CONTRAST TECHNIQUE: Multiplanar, multiecho pulse sequences of the brain and surrounding structures were obtained without and with intravenous contrast. CONTRAST:  59mL GADAVIST GADOBUTROL 1 MMOL/ML IV SOLN COMPARISON:  MRI brain without and with contrast 01/22/2018 FINDINGS: Brain: A heterogeneous predominantly T2 hyperintense lesion is centered in the left basal ganglia spanning the left caudate head, internal capsule, and lentiform nucleus. A 13 mm linear  area of enhancement is present along the medial margin of this lesion. There is some restricted diffusion in the more lateral aspect of the lesion. Intrinsic T1 shortening is present in the inferior and posterior aspects of the lesion. There is no significant mass effect. No significant surrounding vasogenic edema is present. Moderate periventricular and subcortical T2 hyperintensities are present otherwise. Mild generalized atrophy is present. No significant extraaxial fluid collection is present. Mild white matter changes extend into the brainstem, more prominent on the left. The internal auditory canals are within normal limits. The brainstem and cerebellum are within normal limits. No other pathologic enhancement is present. Vascular: Flow is present in the major intracranial arteries. Skull and upper cervical spine: Mild disc disease is present at C3-4 without significant stenosis. Craniocervical junction is normal. Marrow signal is normal. Sinuses/Orbits: Some fluid is present in the mastoid air cells bilaterally. The paranasal sinuses and mastoid air cells are otherwise clear. The globes and orbits are within normal limits. IMPRESSION: 1. Heterogeneous  predominantly T2 hyperintense lesion centered in the left basal ganglia spanning the left caudate head, internal capsule, and lentiform nucleus. This may represent a late subacute/early chronic infarct. Malignancy is considered less likely unless this is a known lesion that has already been treated. In the absence of that history, short-term follow-up MRI without and with contrast at 4-6 weeks is recommended to evaluate evolution of this lesion to better define the possibility of infarct. 2. Intrinsic T1 shortening in the inferior and posterior aspects of the lesion without significant mass effect. This could also be seen in the setting of infarct. There is no susceptibility to suggest blood products. 3. Mild generalized atrophy and moderate white matter disease otherwise likely reflects the sequela of chronic microvascular ischemia. 4. Bilateral mastoid effusions. No obstructing nasopharyngeal lesion is present. Electronically Signed   By: San Morelle M.D.   On: 02/04/2021 06:36    SIGNED: Deatra James, MD, FHM. [facility],  Pager (please use [email or web address] to page/text) Please use Epic Secure Chat for non-urgent communication (7AM-7PM)  If 7PM-7AM, please contact night-coverage [email or web address], 02/06/2021, 11:42 AM

## 2021-02-06 NOTE — Evaluation (Signed)
Physical Therapy Evaluation Patient Details Name: [name] MRN: [id number] DOB: [birth year] Today's Date: 02/06/2021   History of Present Illness  [name] is a 80 y.o. male with medical history significant of stage IV metastatic bladder cancer, status post radiation, chemotherapy, status post post ilial conduit, chronic recurrent UTIs, chronic pain, BPH, GERD, presented with fever, chills.  Reports feels like another bladder infection.   Clinical Impression  Patient functioning near baseline for functional mobility and gait. Patient requires HHA to transition to seated EOB. He demonstrates good sitting tolerance and sitting balance. He transfers to standing and ambulates without AD with slight unsteadiness without loss of balance. Patient discharged to care of nursing for ambulation daily as tolerated for length of stay.      Follow Up Recommendations No PT follow up    Equipment Recommendations  None recommended by PT    Recommendations for Other Services       Precautions / Restrictions Precautions Precautions: Fall Restrictions Weight Bearing Restrictions: No      Mobility  Bed Mobility Overal bed mobility: Needs Assistance Bed Mobility: Supine to Sit     Supine to sit: HOB elevated;Min assist     General bed mobility comments: 1 HHA    Transfers Overall transfer level: Needs assistance Equipment used: None Transfers: Sit to/from [employer] Transfers Sit to Stand: Min guard Stand pivot transfers: Min guard       General transfer comment: slightly unsteady upon standing without AD  Ambulation/Gait Ambulation/Gait assistance: Min guard Gait Distance (Feet): 200 Feet Assistive device: None Gait Pattern/deviations: Step-through pattern Gait velocity: decreased   General Gait Details: slightly unsteady cadence without AD, patient states baseline as he has hip problems  Stairs            Wheelchair Mobility    Modified Rankin  (Stroke Patients Only)       Balance Overall balance assessment: Independent                                           Pertinent Vitals/Pain Pain Assessment: No/denies pain    Home Living Family/patient expects to be discharged to:: Private residence Living Arrangements: Spouse/significant other Available Help at Discharge: Family Type of Home: House Home Access: Stairs to enter Entrance Stairs-Rails: None Entrance Stairs-Number of Steps: 1 Home Layout: Two level Home Equipment: Grab bars - tub/shower;Walker - 2 wheels;Cane - single point;Wheelchair - manual      Prior Function Level of Independence: Independent         Comments: Patient states community ambulator without AD, drives, independent with ADL     Hand Dominance   Dominant Hand: Right    Extremity/Trunk Assessment   Upper Extremity Assessment Upper Extremity Assessment: Defer to OT evaluation    Lower Extremity Assessment Lower Extremity Assessment: Overall WFL for tasks assessed    Cervical / Trunk Assessment Cervical / Trunk Assessment: Normal  Communication   Communication: No difficulties  Cognition Arousal/Alertness: Awake/alert Behavior During Therapy: WFL for tasks assessed/performed Overall Cognitive Status: Within Functional Limits for tasks assessed                                        General Comments      Exercises     Assessment/Plan  PT Assessment Patent does not need any further PT services  PT Problem List         PT Treatment Interventions      PT Goals (Current goals can be found in the Care Plan section)  Acute Rehab PT Goals Patient Stated Goal: Return home PT Goal Formulation: With patient Time For Goal Achievement: 02/06/21 Potential to Achieve Goals: Good    Frequency     Barriers to discharge        Co-evaluation PT/OT/SLP Co-Evaluation/Treatment: Yes Reason for Co-Treatment: Complexity of the patient's  impairments (multi-system involvement) PT goals addressed during session: Mobility/safety with mobility;Balance;Strengthening/ROM         AM-PAC PT "6 Clicks" Mobility  Outcome Measure Help needed turning from your back to your side while in a flat bed without using bedrails?: None Help needed moving from lying on your back to sitting on the side of a flat bed without using bedrails?: A Little Help needed moving to and from a bed to a chair (including a wheelchair)?: None Help needed standing up from a chair using your arms (e.g., wheelchair or bedside chair)?: None Help needed to walk in hospital room?: None Help needed climbing 3-5 steps with a railing? : None 6 Click Score: 23    End of Session   Activity Tolerance: Patient tolerated treatment well Patient left: in bed;with call bell/phone within reach Nurse Communication: Mobility status PT Visit Diagnosis: Unsteadiness on feet (R26.81);Other abnormalities of gait and mobility (R26.89)    Time: 4707-6151 PT Time Calculation (min) (ACUTE ONLY): 17 min   Charges:   PT Evaluation $PT Eval Low Complexity: 1 Low         9:25 AM, 02/06/21 Mearl Latin PT, DPT Physical Therapist at [facility]

## 2021-02-06 NOTE — Consult Note (Signed)
Consultation Note Date: 02/06/2021   Patient Name: [name]  DOB: 1941-06-13  MRN: [id number]  Age / Sex: 80 y.o., male  PCP: [name], MD Referring Physician: Deatra James, MD  Reason for Consultation: Establishing goals of care  HPI/Patient Profile: 80 y.o. male  with past medical history of bladder cancer with metastatic burden to bone and lung status post chemo and radiation, status post ileal conduit, chronic recurrent UTIs, chronic pain, BPH, GERD admitted on 02/05/2021 with septic UTI.   Clinical Assessment and Goals of Care: I have reviewed medical records including EPIC notes, labs and imaging, received report from RN, assessed the patient and met at the bedside to discuss diagnosis prognosis, GOC, EOL wishes, disposition and options.  I introduced Palliative Medicine as specialized medical care for people living with serious illness. It focuses on providing relief from the symptoms and stress of a serious illness. The goal is to improve quality of life for both the patient and the family.  [name] is living in his own home with his wife Fraser Din.  He states that Pat's health is pretty good.  He shares that he has a son Corene Cornea and her daughter Lattie Haw who can help if needed.  We talked about his recurrent UTIs, bladder cancer with metastatic burden.  [name] tells me that he follows up with Dr. Raliegh [name], and just saw him recently.  He shares that the test results show that his cancer has not increased and they are in a watching stage at this point.  We talked about recurrent UTI and the treatment plan.  [name] denies questions or concerns at this time.    Advanced directives, concepts specific to code status, were considered and discussed.  We talked about the concept of "treat the treatable, but allowing natural passing".  I encourage [name] to consider his choices and share these with his family.   We also talked about healthcare power of attorney, see below.  Palliative Care services outpatient were explained and offered.  [name] is agreeable for outpatient palliative services to follow for continued goals of care and CODE STATUS discussions.  Discussed the importance of continued conversation with family and the medical providers regarding overall plan of care and treatment options, ensuring decisions are within the context of the patient's values and GOCs.    Questions and concerns were addressed.  The family was encouraged to call with questions or concerns.  PMT will continue to support holistically.  Conference with attending, bedside nursing staff, transition of care team related to patient condition, needs, goals of care, disposition.   HCPOA   NEXT OF KIN - wife Fraser Din with help of children, Corene Cornea and Lattie Haw     SUMMARY OF RECOMMENDATIONS   At this point continue to treat the treatable Follow-up with oncology, under surveillance at this time. Outpatient palliative services to follow  Code Status/Advance Care Planning: Full code -we talked about the concept of "treat the treatable, but allowing natural death".  I encourage Mr.  [name] to consider his choices and share them with his family.  Symptom Management:  Per hospitalist, no additional needs at this time.  Palliative Prophylaxis:  Frequent Pain Assessment and Oral Care  Additional Recommendations (Limitations, Scope, Preferences): Full Scope Treatment  Psycho-social/Spiritual:  Desire for further Chaplaincy support:no Additional Recommendations: Caregiving  Support/Resources and Education on Hospice  Prognosis:  Unable to determine, based on outcomes.  6 months or more would not be surprising based on current functional status, cancer status.  Discharge Planning:  Anticipate home with home health if needed       Primary Diagnoses: Present on Admission:  Sepsis ([place])  Malignant neoplasm of urinary bladder  ([place])  Bladder cancer ([place])  Bone metastasis ([place])  Sepsis due to urinary tract infection ([place])   I have reviewed the medical record, interviewed the patient and family, and examined the patient. The following aspects are pertinent.  Past Medical History:  Diagnosis Date   Bladder cancer ([place])    BPH (benign prostatic hypertrophy)    Dysuria    GERD (gastroesophageal reflux disease)    History of adenomatous polyp of colon    tubular adenoma 06/ 2018   History of bladder cancer 12/2014   urologist-  dr [name]--  dx High Grade TCC without stromal invasion s/p TURBT and intravesical BCG tx/  recurrent 07/2015 (pTa) TCC  repear intravesical BCG tx    History of hiatal hernia    History of urethral stricture    Nocturia    Social History   Socioeconomic History   Marital status: Married    Spouse name: Not on file   Number of children: 1   Years of education: Not on file   Highest education level: Not on file  Occupational History   Occupation: Retired  Tobacco Use   Smoking status: Former    Packs/day: 1.00    Years: 34.00    Pack years: 34.00    Types: Cigarettes    Quit date: 12/26/1988    Years since quitting: 32.1   Smokeless tobacco: Never  Vaping Use   Vaping Use: Never used  Substance and Sexual Activity   Alcohol use: Yes    Alcohol/week: 6.0 standard drinks    Types: 6 Cans of beer per week    Comment: BEER   Drug use: No   Sexual activity: Never  Other Topics Concern   Not on file  Social History Narrative   Not on file   Social Determinants of Health   Financial Resource Strain: Not on file  Food Insecurity: Not on file  Transportation Needs: Not on file  Physical Activity: Not on file  Stress: Not on file  Social Connections: Not on file   Family History  Problem Relation Age of Onset   Alcoholism Father    Colon cancer Neg Hx    Colon polyps Neg Hx    Diabetes Neg Hx    Kidney disease Neg Hx    Esophageal cancer Neg Hx    Heart disease  Neg Hx    Gallbladder disease Neg Hx    Allergic rhinitis Neg Hx    Angioedema Neg Hx    Asthma Neg Hx    Atopy Neg Hx    Eczema Neg Hx    Immunodeficiency Neg Hx    Urticaria Neg Hx    Scheduled Meds:  Chlorhexidine Gluconate Cloth  6 each Topical Daily   cyanocobalamin  1,000 mcg Intramuscular Q30 days   heparin  5,000  Units Subcutaneous Q8H   lactobacillus  1 g Oral TID WC   omalizumab  300 mg Subcutaneous Q14 Days   sodium chloride flush  3 mL Intravenous Q12H   sodium chloride flush  3 mL Intravenous Q12H   Continuous Infusions:  lactated ringers 150 mL/hr at 02/06/21 0905   meropenem (MERREM) IV Stopped (02/06/21 0647)   PRN Meds:.acetaminophen **OR** acetaminophen, bisacodyl, diphenhydrAMINE, hydrALAZINE, HYDROmorphone (DILAUDID) injection, HYDROmorphone, ipratropium, levalbuterol, magnesium citrate, oxyCODONE, senna-docusate, traZODone Medications Prior to Admission:  Prior to Admission medications   Medication Sig Start Date End Date Taking? Authorizing Provider  acetaminophen (TYLENOL) 325 MG tablet Take 2 tablets (650 mg total) by mouth every 6 (six) hours as needed for mild pain, fever or headache (or Fever >/= 101). 06/19/20  Yes Emokpae, Courage, MD  clotrimazole-betamethasone (LOTRISONE) cream Apply 1 application topically in the morning and at bedtime. 06/27/20  Yes [provider]  HYDROmorphone (DILAUDID) 4 MG tablet Take 1 tablet (4 mg total) by mouth every 4 (four) hours as needed for severe pain. 01/07/21  Yes [name], MD  lactose free nutrition (BOOST) LIQD Take 237 mLs by mouth 3 (three) times daily between meals.    Yes [provider]  oxyCODONE (OXY IR/ROXICODONE) 5 MG immediate release tablet Take 1-2 tablets (5-10 mg total) by mouth every 4 (four) hours as needed for severe pain. 01/07/21  Yes [name], MD  polyethylene glycol (MIRALAX / GLYCOLAX) 17 g packet Take 17 g by mouth daily as needed for mild constipation. 07/18/20   Yes Emokpae, Courage, MD  cyanocobalamin (,VITAMIN B-12,) 1000 MCG/ML injection Inject 1 mL (1,000 mcg total) into the muscle every 30 (thirty) days. Patient not taking: No sig reported 07/18/20   Roxan Hockey, MD  linezolid (ZYVOX) 600 MG tablet TAKE 1 TABLET (600 MG TOTAL) BY MOUTH EVERY 12 (TWELVE) HOURS FOR 7 DAYS. Patient not taking: No sig reported 07/17/20 07/17/21  Roxan Hockey, MD   Allergies  Allergen Reactions   Ciprofloxacin Hives   Cefepime Rash   Zofran [Ondansetron] Rash   Review of Systems  Unable to perform ROS: Age   Physical Exam Vitals and nursing note reviewed.  Constitutional:      General: He is not in acute distress.    Appearance: Normal appearance. He is not ill-appearing.  HENT:     Head: Normocephalic and atraumatic.     Mouth/Throat:     Mouth: Mucous membranes are moist.  Cardiovascular:     Rate and Rhythm: Normal rate.  Pulmonary:     Effort: Pulmonary effort is normal. No respiratory distress.  Musculoskeletal:        General: No swelling.  Skin:    General: Skin is warm and dry.  Neurological:     Mental Status: He is alert and oriented to person, place, and time.  Psychiatric:        Mood and Affect: Mood normal.        Behavior: Behavior normal.    Vital Signs: BP (!) 113/54   Pulse 71   Temp 97.8 F (36.6 C) (Oral)   Resp 17   Ht _0  (1.803 m)   Wt 92.1 kg   SpO2 99%   BMI 28.32 kg/m  Pain Scale: 0-10   Pain Score: 0-No pain   SpO2: SpO2: 99 % O2 Device:SpO2: 99 % O2 Flow Rate: .O2 Flow Rate (L/min): 2 L/min  IO: Intake/output summary:  Intake/Output Summary (Last 24 hours) at 02/06/2021 [phone]  Last data filed at 02/06/2021 4496 Gross per 24 hour  Intake 2104.04 ml  Output 2300 ml  Net -195.96 ml    LBM: Last BM Date: 02/04/21 Baseline Weight: Weight: 92.1 kg Most recent weight: Weight: 92.1 kg     Palliative Assessment/Data:   Flowsheet Rows    Flowsheet Row Most Recent Value  Intake Tab   Referral  Department Hospitalist  Unit at Time of Referral Intermediate Care Unit  Palliative Care Primary Diagnosis Sepsis/Infectious Disease  Date Notified 02/05/21  Palliative Care Type New Palliative care  Reason for referral Clarify Goals of Care  Date of Admission 02/05/21  Date first seen by Palliative Care 02/06/21  # of days Palliative referral response time 1 Day(s)  # of days [name] prior to Palliative referral 0  Clinical Assessment   Palliative Performance Scale Score 50%  Pain Max last 24 hours Not able to report  Pain Min Last 24 hours Not able to report  Dyspnea Max Last 24 Hours Not able to report  Dyspnea Min Last 24 hours Not able to report  Psychosocial & Spiritual Assessment   Palliative Care Outcomes        Time In: 0830 Time Out: 0920 Time Total: 50 minutes Greater than 50%  of this time was spent counseling and coordinating care related to the above assessment and plan.  Signed by: Drue Novel, NP   Please contact Palliative Medicine Team phone at [phone] for questions and concerns.  For individual provider: See Shea Evans

## 2021-02-06 NOTE — Evaluation (Signed)
Occupational Therapy Evaluation Patient Details Name: [name] MRN: [id number] DOB: [birth year] Today's Date: 02/06/2021    History of Present Illness [name] is a 80 y.o. male with medical history significant of stage IV metastatic bladder cancer, status post radiation, chemotherapy, status post post ilial conduit, chronic recurrent UTIs, chronic pain, BPH, GERD, presented with fever, chills.  Reports feels like another bladder infection.   Clinical Impression   Pt agreeable to OT/PT co-evaluation. Pt functioning at or near baseline. Good UE strength during formal assessment. 1 hand held assist for supine to sit bed mobility. Min guard assist during transfers without AD. Pt not recommended for further acute OT and will be discharged to care of nursing for the remainder of his stay.     Follow Up Recommendations  No OT follow up    Equipment Recommendations  None recommended by OT           Precautions / Restrictions Precautions Precautions: Fall Restrictions Weight Bearing Restrictions: No      Mobility Bed Mobility Overal bed mobility: Needs Assistance Bed Mobility: Supine to Sit     Supine to sit: HOB elevated;Min assist     General bed mobility comments: 1 HHA    Transfers Overall transfer level: Needs assistance Equipment used: None Transfers: Sit to/from [employer] Transfers Sit to Stand: Min guard Stand pivot transfers: Min guard       General transfer comment: slightly unsteady upon standing without AD    Balance Overall balance assessment: Independent                                         ADL either performed or assessed with clinical judgement   ADL Overall ADL's : Modified independent                                       General ADL Comments: Mod I donning socks at EOB     Vision Baseline Vision/History: No visual deficits Patient Visual Report: No change from baseline                   Pertinent Vitals/Pain Pain Assessment: No/denies pain     Hand Dominance Right   Extremity/Trunk Assessment Upper Extremity Assessment Upper Extremity Assessment: Overall WFL for tasks assessed   Lower Extremity Assessment Lower Extremity Assessment: Defer to PT evaluation   Cervical / Trunk Assessment Cervical / Trunk Assessment: Normal   Communication Communication Communication: No difficulties   Cognition Arousal/Alertness: Awake/alert Behavior During Therapy: WFL for tasks assessed/performed Overall Cognitive Status: Within Functional Limits for tasks assessed                                                      Home Living Family/patient expects to be discharged to:: Private residence Living Arrangements: Spouse/significant other Available Help at Discharge: Family;Available 24 hours/day Type of Home: House Home Access: Stairs to enter [employer] of Steps: 1 Entrance Stairs-Rails: None Home Layout: Two level Alternate Level Stairs-Number of Steps: 8 Alternate Level Stairs-Rails: Right Bathroom Shower/Tub: Tub/shower unit;Walk-in shower   Bathroom Toilet: Standard Bathroom Accessibility: Yes   Home Equipment:  Grab bars - tub/shower;Walker - 2 wheels;Cane - single point;Wheelchair - manual          Prior Functioning/Environment Level of Independence: Independent        Comments: Patient states community ambulator without AD, drives, independent with ADL                      OT Goals(Current goals can be found in the care plan section) Acute Rehab OT Goals Patient Stated Goal: Return home                   Co-evaluation PT/OT/SLP Co-Evaluation/Treatment: Yes Reason for Co-Treatment: Complexity of the patient's impairments (multi-system involvement) PT goals addressed during session: Mobility/safety with mobility;Balance;Strengthening/ROM OT goals addressed during session: ADL's and  self-care;Strengthening/ROM                       End of Session    Activity Tolerance: Patient tolerated treatment well Patient left: in bed;with call bell/phone within reach  OT Visit Diagnosis: Unsteadiness on feet (R26.81)                Time: 3437-3578 OT Time Calculation (min): 14 min Charges:  OT General Charges $OT Visit: 1 Visit OT Evaluation $OT Eval Low Complexity: 1 Low  Mariella Blackwelder OT, MOT   Larey Seat 02/06/2021, 9:28 AM

## 2021-02-06 NOTE — Evaluation (Signed)
Clinical/Bedside Swallow Evaluation Patient Details  Name: [name] MRN: [id number] Date of Birth: [birth year]  Today's Date: 02/06/2021 Time: SLP Start Time (ACUTE ONLY): 1540 SLP Stop Time (ACUTE ONLY): 0867 SLP Time Calculation (min) (ACUTE ONLY): 14 min  Past Medical History:  Past Medical History:  Diagnosis Date   Bladder cancer ([place])    BPH (benign prostatic hypertrophy)    Dysuria    GERD (gastroesophageal reflux disease)    History of adenomatous polyp of colon    tubular adenoma 06/ 2018   History of bladder cancer 12/2014   urologist-  dr [name]--  dx High Grade TCC without stromal invasion s/p TURBT and intravesical BCG tx/  recurrent 07/2015 (pTa) TCC  repear intravesical BCG tx    History of hiatal hernia    History of urethral stricture    Nocturia    Past Surgical History:  Past Surgical History:  Procedure Laterality Date   CARDIAC CATHETERIZATION  1997   normal coronary arteries   CARDIOVASCULAR STRESS TEST  12-14-2005   Low risk perfusion study/  very small scar in the inferior septum from mid ventricle to apex,  no ischemia/  mild inferior septal hypokinesis/  ef 58%   CATARACT EXTRACTION W/ INTRAOCULAR LENS  IMPLANT, BILATERAL  2011   COLONOSCOPY  last one 06/ 2018   CYSTOSCOPY WITH BIOPSY N/A 08/12/2015   Procedure: CYSTOSCOPY WITH BIOPSY AND FULGERATION;  Surgeon: [name], MD;  Location: [facility];  Service: Urology;  Laterality: N/A;   CYSTOSCOPY WITH INJECTION N/A 06/29/2017   Procedure: CYSTOSCOPY WITH INJECTION OF INDOCYANINE GREEN DYE;  Surgeon: Alexis Frock, MD;  Location: [facility];  Service: Urology;  Laterality: N/A;   CYSTOSCOPY WITH URETHRAL DILATATION N/A 03/21/2017   Procedure: CYSTOSCOPY;  Surgeon: [name], MD;  Location: [facility] [address];  Service: Urology;  Laterality: N/A;   ESOPHAGOGASTRODUODENOSCOPY  12-05-2014   IR IMAGING GUIDED PORT INSERTION  02/07/2018   IR REMOVAL TUN ACCESS W/  PORT W/O FL MOD SED  04/14/2018   TRANSTHORACIC ECHOCARDIOGRAM  01-31-2008   nomral LVF,  ef 55-65%/  mild pulmonic stenosis without regurg.,  peak transpulomonic valve gradient 32mmHg   TRANSURETHRAL RESECTION OF BLADDER TUMOR N/A 12/31/2014   Procedure: TRANSURETHRAL RESECTION OF BLADDER TUMOR (TURBT);  Surgeon: Lowella Bandy, MD;  Location: [facility];  Service: Urology;  Laterality: N/A;   TRANSURETHRAL RESECTION OF BLADDER TUMOR N/A 03/21/2017   Procedure: POSSIBLE TRANSURETHRAL RESECTION OF BLADDER TUMOR (TURBT);  Surgeon: [name], MD;  Location: [facility];  Service: Urology;  Laterality: N/A;   HPI:  80 y.o. male  with past medical history of bladder cancer with metastatic burden to bone and lung status post chemo and radiation, status post ileal conduit, chronic recurrent UTIs, chronic pain, BPH, GERD admitted on 02/05/2021 with septic UTI.   Assessment / Plan / Recommendation Clinical Impression  Clinical swallowing evaluation completed while Pt was sitting upright in the bed; Pt consumed thin liquids, regular textures and puree textures without overt s/sx of aspiration. SLP reviewed universal aspiration precuations with the Pt. Recommend initiate regular diet with thin liquids; meds are ok whole with liquids. No further ST needs noted at this time, ST will sign off. Thank you. SLP Visit Diagnosis: Dysphagia, unspecified (R13.10)    Aspiration Risk  No limitations    Diet Recommendation Regular;Thin liquid   Liquid Administration via: Cup;Straw Medication Administration: Whole meds with liquid Supervision: Patient able  to self feed Compensations: Minimize environmental distractions;Slow rate;Small sips/bites Postural Changes: Seated upright at 90 degrees    Other  Recommendations Oral Care Recommendations: Oral care BID   Follow up Recommendations None        Swallow Study   General Date of Onset: 02/05/21 HPI: 80 y.o. male  with past  medical history of bladder cancer with metastatic burden to bone and lung status post chemo and radiation, status post ileal conduit, chronic recurrent UTIs, chronic pain, BPH, GERD admitted on 02/05/2021 with septic UTI. Type of Study: Bedside Swallow Evaluation Previous Swallow Assessment: none in chart Diet Prior to this Study: NPO Temperature Spikes Noted: No Respiratory Status: Room air History of Recent Intubation: No Behavior/Cognition: Alert;Cooperative;Pleasant mood Oral Cavity Assessment: Within Functional Limits Oral Care Completed by SLP: Recent completion by staff Oral Cavity - Dentition: Dentures, top Vision: Functional for self-feeding Self-Feeding Abilities: Able to feed self Patient Positioning: Upright in bed Baseline Vocal Quality: Normal Volitional Cough: Strong Volitional Swallow: Able to elicit    Oral/Motor/Sensory Function Overall Oral Motor/Sensory Function: Within functional limits   Ice Chips Ice chips: Within functional limits   Thin Liquid Thin Liquid: Within functional limits    Nectar Thick Nectar Thick Liquid: Not tested   Honey Thick Honey Thick Liquid: Not tested   Puree Puree: Within functional limits   Solid     Solid: Within functional limits     Danil Wedge H. Roddie Mc, CCC-SLP Speech Language Pathologist  Wende Bushy 02/06/2021,10:46 AM

## 2021-02-07 LAB — COMPREHENSIVE METABOLIC PANEL
ALT: 16 U/L (ref 0–44)
AST: 20 U/L (ref 15–41)
Albumin: 2.4 g/dL — ABNORMAL LOW (ref 3.5–5.0)
Alkaline Phosphatase: 38 U/L (ref 38–126)
Anion gap: 4 — ABNORMAL LOW (ref 5–15)
BUN: 15 mg/dL (ref 8–23)
CO2: 29 mmol/L (ref 22–32)
Calcium: 8.2 mg/dL — ABNORMAL LOW (ref 8.9–10.3)
Chloride: 106 mmol/L (ref 98–111)
Creatinine, Ser: 0.94 mg/dL (ref 0.61–1.24)
GFR, Estimated: >60 mL/min (ref >60)
Glucose, Bld: 92 mg/dL (ref 70–99)
Potassium: 4 mmol/L (ref 3.5–5.1)
Sodium: 139 mmol/L (ref 135–145)
Total Bilirubin: 0.5 mg/dL (ref 0.3–1.2)
Total Protein: 4.7 g/dL — ABNORMAL LOW (ref 6.5–8.1)

## 2021-02-07 LAB — CBC WITH DIFFERENTIAL/PLATELET
Abs Immature Granulocytes: 0.06 10*3/uL (ref 0.00–0.07)
Basophils Absolute: 0 10*3/uL (ref 0.0–0.1)
Basophils Relative: 1 %
Eosinophils Absolute: 0.1 10*3/uL (ref 0.0–0.5)
Eosinophils Relative: 1 %
HCT: 35.5 % — ABNORMAL LOW (ref 39.0–52.0)
Hemoglobin: 11.5 g/dL — ABNORMAL LOW (ref 13.0–17.0)
Immature Granulocytes: 1 %
Lymphocytes Relative: 5 %
Lymphs Abs: 0.4 10*3/uL — ABNORMAL LOW (ref 0.7–4.0)
MCH: 35.6 pg — ABNORMAL HIGH (ref 26.0–34.0)
MCHC: 32.4 g/dL (ref 30.0–36.0)
MCV: 109.9 fL — ABNORMAL HIGH (ref 80.0–100.0)
Monocytes Absolute: 0.9 10*3/uL (ref 0.1–1.0)
Monocytes Relative: 13 %
Neutro Abs: 5.6 10*3/uL (ref 1.7–7.7)
Neutrophils Relative %: 79 %
Platelets: 136 10*3/uL — ABNORMAL LOW (ref 150–400)
RBC: 3.23 MIL/uL — ABNORMAL LOW (ref 4.22–5.81)
RDW: 12.8 % (ref 11.5–15.5)
WBC: 7 10*3/uL (ref 4.0–10.5)
nRBC: 0 % (ref 0.0–0.2)

## 2021-02-07 LAB — RESP PANEL BY RT-PCR (FLU A&B, COVID) ARPGX2
Influenza A by PCR: NEGATIVE
Influenza B by PCR: NEGATIVE
SARS Coronavirus 2 by RT PCR: NEGATIVE

## 2021-02-07 LAB — GLUCOSE, CAPILLARY: Glucose-Capillary: 98 mg/dL (ref 70–99)

## 2021-02-07 NOTE — Progress Notes (Signed)
Report called to Rachael on Dept. 300. Patient transported via wheelchair with NT. Pt's wife notified per request.

## 2021-02-07 NOTE — Progress Notes (Signed)
PROGRESS NOTE    Patient: [name]                            PCP: [name], MD                    DOB: Mar 19, 1941            DOA: 02/05/2021 HUD:[id number]             DOS: 02/07/2021, 12:20 PM   LOS: 2 days   Date of Service: The patient was seen and examined on 02/07/2021  Subjective:   The patient was seen and examined this morning, much more awake, hemodynamic stable Febrile, normotensive ....  Wife present at bedside  Brief Narrative:    [name] is a 80 y.o. male with medical history significant of stage IV metastatic bladder cancer, status post radiation, chemotherapy, status post post ilial conduit, chronic recurrent UTIs, chronic pain, BPH, GERD, presented with fever, chills. Reports feels like another bladder infection.      Patient Denies having: Cough, SOB, Chest Pain, Abd pain, N/V/D, headache, dizziness, lightheadedness,  Dysuria, Joint pain, rash, open wounds   ED Course:   Vitals: Temp 98.6 pulse 109 respiratory rate 14-20, BP 105/65, satting 94% on room air, Abnormal labs; WBC 20.4, lactic acid 0.8, electrolytes within normal limits, BUN 21, creatinine 1.15,   UA: Cloudy, large leukocyte Estrace, nitrite, WBC > 50 CT abdomen pelvis -pending   CT abdomen pelvis chest without contrast reviewed from 02/03/21    Assessment & Plan:   Principal Problem:   Sepsis ([place]) Active Problems:   Malignant neoplasm of urinary bladder (HCC)   Bladder cancer (HCC)   Status post ileal conduit (HCC)   Bone metastasis (HCC)   Sepsis due to urinary tract infection (HCC)   Principal Problem:    Sepsis ([place]) -due to complicated urinary tract infection, from complicated bladder cancer with chronic urostomy -Hemodynamically stable, much improved sepsis pathophysiology -Afebrile normotensive, much improved leukocytosis  -Patient meets the sepsis criteria with leukocytosis 20.4, PR 109... With source of infection urine -bilateral urostomy chronic - lactic  acid 0.8, electrolytes within normal limits, BUN 21, creatinine 1.15, - UA: Cloudy, large leukocyte Estrace, nitrite, WBC > 50 -Sepsis protocol initiated, aggressive IV fluid resuscitation, due to penicillin allergies, anabiotic's ertapenem -Blood cultures x2 >> no growth to date -Urine cultures >> no growth to date -Following labs, lactic acid 1.2  -We will continue to monitor    History of stage IV bladder - Urotheial cancer with metastases to lung, bone Patient is to follow-up with primary oncologist  -Initially diagnosed in 2019, status post surgical chemo therapy -Oncologist Dr. Alen Blew -He completed radiation therapy to the pelvis on January 16, 2018 under the care of Dr. Tammi Klippel.  Carboplatin and gemcitabine he is status post 6 cycles of therapy completed on 05/22/2018.  Pembrolizumab 200 mg every 3 weeks started on 06/21/2018.   He completed 8 cycles of therapy and April 2020.  Therapy discontinued because of dermatological toxicities. Status post TURBT He is status post radiation therapy to the right paratracheal lymph node for a total of 50 Gray in 10 fractions completed on April 23, 2020.   [id number] CT abdomen pelvis revealing cystoprostatectomy with ileal loop diversion. Stable lytic bone metastasis involving the right ischial tuberosity.   Stable large multinodular confluent opacity in right lung apex, which remains indeterminate. Stable 11 mm spiculated nodule  in anterior left upper lobe. This does not have typical appearance of pulmonary metastasis, and a primary bronchogenic carcinoma cannot be excluded.   Increased size of 1.4 cm subcarinal lymph node. Metastatic disease cannot be excluded.   MRI of the brain 02/03/2021 revealing 2 hyperintense lesions left basal ganglia   Further work-up per Dr. Alen Blew Continue home medication of [place] Stable  Active Problems:   Malignant neoplasm of urinary bladder ([place]) -as mentioned above follow-up with Dr. Alen Blew as an  outpatient   Status post ileal conduit [facility]) -bilateral urostomy present, positive for urine Chronic pain -continue home medication of oxycodone, as needed Remained stable    Cultures: -02/05/21 Blood Cultures -no growth to date -02/05/21 urine cultures -no growth to date Antimicrobial: -Meropenem x180 initiating Azactam Iv   Consults called:  None -------------------------------------------------------------------------------------------------------------------------------------------- DVT prophylaxis: SCD/Compression stockings and Heparin SQ Code Status:   Code Status: Full Code     Admission status: Patient will be admitted as Inpatient, with a greater than 2 midnight length of stay. Level of care: Stepdown     Family Communication:  none at bedside (The above findings and plan of care has been discussed with patient in detail, the patient expressed understanding and agreement of above plan) --------------------------------------------------------------------------------------------------------------------------------------------------   Disposition Plan: >3 days Status is: Inpatient   Remains inpatient appropriate because:Inpatient level of care appropriate due to severity of illness   Dispo: The patient is from: Home              Anticipated d/c is to: Home in 2-3 days               Patient currently is not medically stable to d/c.  As patient is found septic with metastatic bladder cancer needing aggressive IV fluid treatment with IV antibiotics              Difficult to place patient No    Level of care: Med-Surg   Procedures:   No admission procedures for hospital encounter.    Antimicrobials:  Anti-infectives (From admission, onward)    Start     Dose/Rate Route Frequency Ordered Stop   02/05/21 2300  meropenem (MERREM) 1 g in sodium chloride 0.9 % 100 mL IVPB        1 g 200 mL/hr over 30 Minutes Intravenous Every 8 hours 02/05/21 1507     02/05/21 1515   aztreonam (AZACTAM) 2 g in sodium chloride 0.9 % 100 mL IVPB  Status:  Discontinued        2 g 200 mL/hr over 30 Minutes Intravenous  Once 02/05/21 1509 02/05/21 1539   02/05/21 1430  meropenem (MERREM) 1 g in sodium chloride 0.9 % 100 mL IVPB        1 g 200 mL/hr over 30 Minutes Intravenous  Once 02/05/21 1428 02/05/21 1537        Medication:   Chlorhexidine Gluconate Cloth  6 each Topical Daily   cyanocobalamin  1,000 mcg Intramuscular Q30 days   heparin  5,000 Units Subcutaneous Q8H   lactobacillus  1 g Oral TID WC   omalizumab  300 mg Subcutaneous Q14 Days   sodium chloride flush  3 mL Intravenous Q12H   sodium chloride flush  3 mL Intravenous Q12H    acetaminophen **OR** acetaminophen, bisacodyl, diphenhydrAMINE, hydrALAZINE, HYDROmorphone (DILAUDID) injection, HYDROmorphone, ipratropium, levalbuterol, magnesium citrate, oxyCODONE, senna-docusate, traZODone   Objective:   Vitals:   02/07/21 0450 02/07/21 0700 02/07/21 0800 02/07/21 0900  BP:   130/61  Pulse:  76 70 89  Resp:      Temp: 98.3 F (36.8 C)   98.6 F (37 C)  TempSrc: Oral   Oral  SpO2:  91% 94% 96%  Weight: 92.1 kg     Height:        Intake/Output Summary (Last 24 hours) at 02/07/2021 1220 Last data filed at 02/07/2021 0300 Gross per 24 hour  Intake 400.25 ml  Output 950 ml  Net -549.75 ml   Filed Weights   02/05/21 2229 02/07/21 0450  Weight: 92.1 kg 92.1 kg     Examination:      Physical Exam:   General:  Alert, oriented, cooperative, no distress;   HEENT:  Normocephalic, PERRL, otherwise with in Normal limits   Neuro:  CNII-XII intact. , normal motor and sensation, reflexes intact   Lungs:   Clear to auscultation BL, Respirations unlabored, no wheezes / crackles  Cardio:    S1/S2, RRR, No murmure, No Rubs or Gallops   Abdomen:   Soft, non-tender, bowel sounds active all four quadrants,  no guarding or peritoneal signs. Right lower abdomen nephrostomy tube in place, more clear urine   Muscular skeletal:  Limited exam - in bed, able to move all 4 extremities, Normal strength,  2+ pulses,  symmetric, No pitting edema  Skin:  Dry, warm to touch, negative for any Rashes,  Wounds: Please see nursing documentation            ------------------------------------------------------------------------------------------------------------------------------------------    LABs:  CBC Latest Ref Rng & Units 02/07/2021 02/06/2021 02/05/2021  WBC 4.0 - 10.5 K/uL 7.0 17.2(H) 20.4(H)  Hemoglobin 13.0 - 17.0 g/dL 11.5(L) 11.8(L) 13.6  Hematocrit 39.0 - 52.0 % 35.5(L) 36.8(L) 41.4  Platelets 150 - 400 K/uL 136(L) 140(L) 182   CMP Latest Ref Rng & Units 02/07/2021 02/06/2021 02/05/2021  Glucose 70 - 99 mg/dL 92 106(H) 130(H)  BUN 8 - 23 mg/dL 15 18 21   Creatinine 0.61 - 1.24 mg/dL 0.94 1.02 1.15  Sodium 135 - 145 mmol/L 139 141 137  Potassium 3.5 - 5.1 mmol/L 4.0 4.0 3.7  Chloride 98 - 111 mmol/L 106 109 103  CO2 22 - 32 mmol/L 29 28 28   Calcium 8.9 - 10.3 mg/dL 8.2(L) 8.2(L) 8.7(L)  Total Protein 6.5 - 8.1 g/dL 4.7(L) 4.8(L) 5.7(L)  Total Bilirubin 0.3 - 1.2 mg/dL 0.5 1.2 1.5(H)  Alkaline Phos 38 - 126 U/L 38 39 50  AST 15 - 41 U/L 20 12(L) 14(L)  ALT 0 - 44 U/L 16 12 14        Micro Results Recent Results (from the past 240 hour(s))  Culture, blood (single)     Status: None (Preliminary result)   Collection Time: 02/05/21  2:12 PM   Specimen: BLOOD  Result Value Ref Range Status   Specimen Description BLOOD LEFT HAND  Final   Special Requests   Final    BOTTLES DRAWN AEROBIC AND ANAEROBIC Blood Culture adequate volume   Culture   Final    NO GROWTH 2 DAYS Performed at [facility], [address] [place],  [address]    Report Status PENDING  Incomplete  Culture, blood (x 2)     Status: None (Preliminary result)   Collection Time: 02/05/21  3:47 PM   Specimen: BLOOD RIGHT ARM  Result Value Ref Range Status   Specimen Description   Final    BLOOD RIGHT ARM  BOTTLES DRAWN AEROBIC AND ANAEROBIC   Special Requests Blood Culture adequate volume  Final   Culture   Final    NO GROWTH 2 DAYS Performed at [facility], [address] [place], [place] [address]    Report Status PENDING  Incomplete  MRSA Next Gen by PCR, Nasal     Status: None   Collection Time: 02/05/21 10:44 PM   Specimen: Nasal Mucosa; Nasal Swab  Result Value Ref Range Status   MRSA by PCR Next Gen NOT DETECTED NOT DETECTED Final    Comment: (NOTE) The GeneXpert MRSA Assay (FDA approved for NASAL specimens only), is one component of a comprehensive MRSA colonization surveillance program. It is not intended to diagnose MRSA infection nor to guide or monitor treatment for MRSA infections. Test performance is not FDA approved in patients less than 41 years old. Performed at [facility], [address] [place], [place] [address]     Radiology Reports CT ABDOMEN PELVIS WO CONTRAST  Result Date: 02/03/2021 CLINICAL DATA:  Metastatic bladder carcinoma. Previous cystoprostatectomy, chemotherapy, and radiation therapy. EXAM: CT CHEST, ABDOMEN AND PELVIS WITHOUT CONTRAST TECHNIQUE: Multidetector CT imaging of the chest, abdomen and pelvis was performed following the standard protocol without IV contrast. COMPARISON:  Chest CT on 10/15/2019 and AP CT on 06/16/2020 FINDINGS: CT CHEST FINDINGS Cardiovascular: No acute findings. Aortic and coronary atherosclerotic calcification noted. Mediastinum/Lymph Nodes: 1.4 cm subcarinal lymph node is seen, increased from 7 mm on prior study. No other pathologically enlarged lymph nodes identified on this unenhanced exam. Lungs/Pleura: Near complete resolution of right pleural effusion since prior study. A large irregular multinodular confluent opacity is seen in the right lung apex, with largest area measuring 2.7 x 2.7 cm on image 20/3. This remains unchanged in size and appearance since prior study. Previously noted sub-solid nodule in the posterior  perihilar right upper lobe has decreased in size, consistent with resolving inflammatory or infectious etiology. A spiculated nodule is also seen in the anterior left upper lobe measuring 11 x 8 mm, also without change since prior exam. No new or enlarging pulmonary nodules or masses identified. Musculoskeletal:  No suspicious bone lesions identified. CT ABDOMEN AND PELVIS FINDINGS Hepatobiliary: No masses visualized on this unenhanced exam. A few small fluid attenuation hepatic cysts remains stable. Gallbladder is unremarkable. No evidence of biliary ductal dilatation. Pancreas: No mass or inflammatory changes identified on this unenhanced exam. Spleen:  Within normal limits in size. Adrenals/Urinary Tract: Stable postop changes from cystoprostatectomy with ileal loop diversion. No evidence of urolithiasis or hydronephrosis. Stomach/Bowel: No evidence of obstruction, inflammatory process, or abnormal fluid collections. Normal appendix visualized. Moderate colonic stool burden is unchanged. Vascular/Lymphatic: No pathologically enlarged lymph nodes identified. No abdominal aortic aneurysm. Aortic atherosclerotic calcification noted. Reproductive: Stable postop changes from cystoprostatectomy. No evidence of recurrent mass. Other:  None. Musculoskeletal: Lytic mass is again seen involving the right ischial tuberosity which is stable since previous study. No other suspicious bone lesions identified. 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 No other sites of abdominal or pelvic metastatic disease identified. Stable large multinodular confluent opacity in right lung apex, which remains indeterminate. Differential diagnosis includes scarring or post radiation changes, with malignancy considered less likely. Continued attention on follow-up imaging is recommended. Stable 11 mm spiculated nodule in anterior left upper lobe. This  does not have typical appearance of pulmonary metastasis, and a primary bronchogenic carcinoma cannot be excluded. Increased size of 1.4 cm subcarinal lymph node. Metastatic disease cannot be excluded. Aortic Atherosclerosis (ICD10-I70.0). Electronically Signed  By: Marlaine Hind M.D.   On: 02/03/2021 16:17   CT Chest Wo Contrast  Result Date: 02/03/2021 CLINICAL DATA:  Metastatic bladder carcinoma. Previous cystoprostatectomy, chemotherapy, and radiation therapy. EXAM: CT CHEST, ABDOMEN AND PELVIS WITHOUT CONTRAST TECHNIQUE: Multidetector CT imaging of the chest, abdomen and pelvis was performed following the standard protocol without IV contrast. COMPARISON:  Chest CT on 10/15/2019 and AP CT on 06/16/2020 FINDINGS: CT CHEST FINDINGS Cardiovascular: No acute findings. Aortic and coronary atherosclerotic calcification noted. Mediastinum/Lymph Nodes: 1.4 cm subcarinal lymph node is seen, increased from 7 mm on prior study. No other pathologically enlarged lymph nodes identified on this unenhanced exam. Lungs/Pleura: Near complete resolution of right pleural effusion since prior study. A large irregular multinodular confluent opacity is seen in the right lung apex, with largest area measuring 2.7 x 2.7 cm on image 20/3. This remains unchanged in size and appearance since prior study. Previously noted sub-solid nodule in the posterior perihilar right upper lobe has decreased in size, consistent with resolving inflammatory or infectious etiology. A spiculated nodule is also seen in the anterior left upper lobe measuring 11 x 8 mm, also without change since prior exam. No new or enlarging pulmonary nodules or masses identified. Musculoskeletal:  No suspicious bone lesions identified. CT ABDOMEN AND PELVIS FINDINGS Hepatobiliary: No masses visualized on this unenhanced exam. A few small fluid attenuation hepatic cysts remains stable. Gallbladder is unremarkable. No evidence of biliary ductal dilatation. Pancreas: No  mass or inflammatory changes identified on this unenhanced exam. Spleen:  Within normal limits in size. Adrenals/Urinary Tract: Stable postop changes from cystoprostatectomy with ileal loop diversion. No evidence of urolithiasis or hydronephrosis. Stomach/Bowel: No evidence of obstruction, inflammatory process, or abnormal fluid collections. Normal appendix visualized. Moderate colonic stool burden is unchanged. Vascular/Lymphatic: No pathologically enlarged lymph nodes identified. No abdominal aortic aneurysm. Aortic atherosclerotic calcification noted. Reproductive: Stable postop changes from cystoprostatectomy. No evidence of recurrent mass. Other:  None. Musculoskeletal: Lytic mass is again seen involving the right ischial tuberosity which is stable since previous study. No other suspicious bone lesions identified. 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 No other sites of abdominal or pelvic metastatic disease identified. Stable large multinodular confluent opacity in right lung apex, which remains indeterminate. Differential diagnosis includes scarring or post radiation changes, with malignancy considered less likely. Continued attention on follow-up imaging is recommended. Stable 11 mm spiculated nodule in anterior left upper lobe. This does not have typical appearance of pulmonary metastasis, and a primary bronchogenic carcinoma cannot be excluded. Increased size of 1.4 cm subcarinal lymph node. Metastatic disease cannot be excluded. Aortic Atherosclerosis (ICD10-I70.0). Electronically Signed   By: Marlaine Hind M.D.   On: 02/03/2021 16:17   MR Brain W Wo Contrast  Result Date: 02/04/2021 CLINICAL DATA:  Bladder cancer. Assess treatment response. Of EXAM: MRI HEAD WITHOUT AND WITH CONTRAST TECHNIQUE: Multiplanar, multiecho pulse sequences of the brain and surrounding structures were obtained without and with  intravenous contrast. CONTRAST:  56mL GADAVIST GADOBUTROL 1 MMOL/ML IV SOLN COMPARISON:  MRI brain without and with contrast 01/22/2018 FINDINGS: Brain: A heterogeneous predominantly T2 hyperintense lesion is centered in the left basal ganglia spanning the left caudate head, internal capsule, and lentiform nucleus. A 13 mm linear area of enhancement is present along the medial margin of this lesion. There is some restricted diffusion in the more lateral aspect of the lesion. Intrinsic T1 shortening is present in the inferior and posterior aspects  of the lesion. There is no significant mass effect. No significant surrounding vasogenic edema is present. Moderate periventricular and subcortical T2 hyperintensities are present otherwise. Mild generalized atrophy is present. No significant extraaxial fluid collection is present. Mild white matter changes extend into the brainstem, more prominent on the left. The internal auditory canals are within normal limits. The brainstem and cerebellum are within normal limits. No other pathologic enhancement is present. Vascular: Flow is present in the major intracranial arteries. Skull and upper cervical spine: Mild disc disease is present at C3-4 without significant stenosis. Craniocervical junction is normal. Marrow signal is normal. Sinuses/Orbits: Some fluid is present in the mastoid air cells bilaterally. The paranasal sinuses and mastoid air cells are otherwise clear. The globes and orbits are within normal limits. IMPRESSION: 1. Heterogeneous predominantly T2 hyperintense lesion centered in the left basal ganglia spanning the left caudate head, internal capsule, and lentiform nucleus. This may represent a late subacute/early chronic infarct. Malignancy is considered less likely unless this is a known lesion that has already been treated. In the absence of that history, short-term follow-up MRI without and with contrast at 4-6 weeks is recommended to evaluate evolution of  this lesion to better define the possibility of infarct. 2. Intrinsic T1 shortening in the inferior and posterior aspects of the lesion without significant mass effect. This could also be seen in the setting of infarct. There is no susceptibility to suggest blood products. 3. Mild generalized atrophy and moderate white matter disease otherwise likely reflects the sequela of chronic microvascular ischemia. 4. Bilateral mastoid effusions. No obstructing nasopharyngeal lesion is present. Electronically Signed   By: San Morelle M.D.   On: 02/04/2021 06:36    SIGNED: Deatra James, MD, FHM. [facility],  Pager (please use [email or web address] to page/text) Please use Epic Secure Chat for non-urgent communication (7AM-7PM)  If 7PM-7AM, please contact night-coverage [email or web address], 02/07/2021, 12:20 PM

## 2021-02-08 LAB — COMPREHENSIVE METABOLIC PANEL
ALT: 21 U/L (ref 0–44)
AST: 23 U/L (ref 15–41)
Albumin: 2.4 g/dL — ABNORMAL LOW (ref 3.5–5.0)
Alkaline Phosphatase: 38 U/L (ref 38–126)
Anion gap: 6 (ref 5–15)
BUN: 13 mg/dL (ref 8–23)
CO2: 28 mmol/L (ref 22–32)
Calcium: 8 mg/dL — ABNORMAL LOW (ref 8.9–10.3)
Chloride: 104 mmol/L (ref 98–111)
Creatinine, Ser: 0.91 mg/dL (ref 0.61–1.24)
GFR, Estimated: >60 mL/min (ref >60)
Glucose, Bld: 100 mg/dL — ABNORMAL HIGH (ref 70–99)
Potassium: 3.7 mmol/L (ref 3.5–5.1)
Sodium: 138 mmol/L (ref 135–145)
Total Bilirubin: 0.6 mg/dL (ref 0.3–1.2)
Total Protein: 4.9 g/dL — ABNORMAL LOW (ref 6.5–8.1)

## 2021-02-08 LAB — CBC WITH DIFFERENTIAL/PLATELET
Abs Immature Granulocytes: 0.02 10*3/uL (ref 0.00–0.07)
Basophils Absolute: 0 10*3/uL (ref 0.0–0.1)
Basophils Relative: 1 %
Eosinophils Absolute: 0.1 10*3/uL (ref 0.0–0.5)
Eosinophils Relative: 2 %
HCT: 34.9 % — ABNORMAL LOW (ref 39.0–52.0)
Hemoglobin: 11.5 g/dL — ABNORMAL LOW (ref 13.0–17.0)
Immature Granulocytes: 1 %
Lymphocytes Relative: 9 %
Lymphs Abs: 0.4 10*3/uL — ABNORMAL LOW (ref 0.7–4.0)
MCH: 36.2 pg — ABNORMAL HIGH (ref 26.0–34.0)
MCHC: 33 g/dL (ref 30.0–36.0)
MCV: 109.7 fL — ABNORMAL HIGH (ref 80.0–100.0)
Monocytes Absolute: 0.8 10*3/uL (ref 0.1–1.0)
Monocytes Relative: 20 %
Neutro Abs: 2.8 10*3/uL (ref 1.7–7.7)
Neutrophils Relative %: 67 %
Platelets: 149 10*3/uL — ABNORMAL LOW (ref 150–400)
RBC: 3.18 MIL/uL — ABNORMAL LOW (ref 4.22–5.81)
RDW: 12.9 % (ref 11.5–15.5)
WBC: 4.1 10*3/uL (ref 4.0–10.5)
nRBC: 0 % (ref 0.0–0.2)

## 2021-02-08 LAB — GLUCOSE, CAPILLARY: Glucose-Capillary: 100 mg/dL — ABNORMAL HIGH (ref 70–99)

## 2021-02-08 MED ORDER — FOSFOMYCIN TROMETHAMINE 3 G PO PACK
3.0000 g | PACK | Freq: Once | ORAL | Status: AC
  Administered 2021-02-08: 3 g via ORAL
  Filled 2021-02-08: qty 3

## 2021-02-08 NOTE — Discharge Summary (Signed)
Physician Discharge Summary [facility]    Patient: [name]                   Admit date: 02/05/2021   DOB: 26-Apr-1941             Discharge date:02/08/2021/10:53 AM MOQ:[id number]                          PCP: [name], MD  Disposition: HOME  Recommendations for Outpatient Follow-up:   Follow up: With oncologist Dr. Alen Blew within 1-2 weeks Follow-up PCP in 2 to 3 weeks Recommending close follow-up with urologist within 1-2 weeks  Discharge Condition: Stable   Code Status:   Code Status: Full Code  Diet recommendation: Regular healthy diet   Discharge Diagnoses:    Principal Problem:   Sepsis ([place]) Active Problems:   Malignant neoplasm of urinary bladder ([place])   Bladder cancer ([place])   Status post ileal conduit ([place])   Bone metastasis ([place])   Sepsis due to urinary tract infection ([place])   History of Present Illness/ Hospital Course [name] Summary:   [name] is a 80 y.o. male with medical history significant of stage IV metastatic bladder cancer, status post radiation, chemotherapy, status post post ilial conduit, chronic recurrent UTIs, chronic pain, BPH, GERD, presented with fever, chills. Reports feels like another bladder infection.      Patient Denies having: Cough, SOB, Chest Pain, Abd pain, N/V/D, headache, dizziness, lightheadedness,  Dysuria, Joint pain, rash, open wounds   ED Course:   Vitals: Temp 98.6 pulse 109 respiratory rate 14-20, BP 105/65, satting 94% on room air, Abnormal labs; WBC 20.4, lactic acid 0.8, electrolytes within normal limits, BUN 21, creatinine 1.15,   UA: Cloudy, large leukocyte Estrace, nitrite, WBC > 50 CT abdomen pelvis -pending   CT abdomen pelvis chest without contrast reviewed from 02/03/21      Principal Problem:    Sepsis ([place]) -due to complicated urinary tract infection, from complicated bladder cancer with chronic urostomy -Hemodynamically stable, much improved sepsis  pathophysiology -Afebrile normotensive, much improved leukocytosis   -Patient meets the sepsis criteria with leukocytosis 20.4, PR 109... With source of infection urine -bilateral urostomy chronic - lactic acid 0.8, electrolytes within normal limits, BUN 21, creatinine 1.15, - UA: Cloudy, large leukocyte Estrace, nitrite, WBC > 50 -Sepsis protocol initiated, aggressive IV fluid resuscitation, due to penicillin allergies, anabiotic's ertapenem -Blood cultures x2 >> no growth to date -Urine cultures >> no growth to date -Following labs, lactic acid 1.2 --Meropenem ... Till 02/08/2021 -Fosfomycin 3 g p.o. x1 02/08/2021     History of stage IV bladder - Urotheial cancer with metastases to lung, bone Patient is to follow-up with primary oncologist   -Initially diagnosed in 2019, status post surgical chemo therapy -Oncologist Dr. Alen Blew -He completed radiation therapy to the pelvis on January 16, 2018 under the care of Dr. Tammi Klippel.  Carboplatin and gemcitabine he is status post 6 cycles of therapy completed on 05/22/2018.  Pembrolizumab 200 mg every 3 weeks started on 06/21/2018.   He completed 8 cycles of therapy and April 2020.  Therapy discontinued because of dermatological toxicities. Status post TURBT He is status post radiation therapy to the right paratracheal lymph node for a total of 50 Gray in 10 fractions completed on April 23, 2020.   [id number] CT abdomen pelvis revealing cystoprostatectomy with ileal loop diversion. Stable lytic bone metastasis involving the right ischial tuberosity.  Stable large multinodular confluent opacity in right lung apex, which remains indeterminate. Stable 11 mm spiculated nodule in anterior left upper lobe. This does not have typical appearance of pulmonary metastasis, and a primary bronchogenic carcinoma cannot be excluded.   Increased size of 1.4 cm subcarinal lymph node. Metastatic disease cannot be excluded.   MRI of the brain 02/03/2021 revealing  2 hyperintense lesions left basal ganglia   Further work-up per Dr. Alen Blew Continue home medication of [place] Stable   Active Problems:   Malignant neoplasm of urinary bladder ([place]) -as mentioned above follow-up with Dr. Alen Blew as an outpatient   Status post ileal conduit [facility]) -bilateral urostomy present, positive for urine Chronic pain -continue home medication of oxycodone, as needed Remained stable    Cultures: -02/05/21 Blood Cultures -no growth to date -02/05/21 urine cultures -no growth to date Antimicrobial: -Meropenem ... Till 02/08/2021 -Fosfomycin 3 g p.o. x1 02/08/2021  Consults called:  None -------------------------------------------------------------------------------------------------------------------------------------------- DVT prophylaxis: SCD/Compression stockings and Heparin SQ Code Status:   Code Status: Full Code     Admission status: Patient will be admitted as Inpatient, with a greater than 2 midnight length of stay. Level of care: Stepdown     Family Communication: Wife at bedside was updated, plan of care discharge were discussed in detail (The above findings and plan of care has been discussed with patient in detail, the patient expressed understanding and agreement of above plan) --------------------------------------------------------------------------------------------------------------------------------------------------   Disposition Plan: Dispo: The patient is from: Home              Anticipated d/c is to: Home      Discharge Instructions:   Discharge Instructions     Activity as tolerated - No restrictions   Complete by: As directed    Call MD for:  difficulty breathing, headache or visual disturbances   Complete by: As directed    Call MD for:  persistant dizziness or light-headedness   Complete by: As directed    Call MD for:  persistant nausea and vomiting   Complete by: As directed    Call MD for:  redness, tenderness, or  signs of infection (pain, swelling, redness, odor or green/yellow discharge around incision site)   Complete by: As directed    Call MD for:  severe uncontrolled pain   Complete by: As directed    Call MD for:  temperature >100.4   Complete by: As directed    Diet - low sodium heart healthy   Complete by: As directed    Discharge instructions   Complete by: As directed    Follow-up with Dr. Alen Blew, and urologist   Increase activity slowly   Complete by: As directed         Medication List     STOP taking these medications    cyanocobalamin 1000 MCG/ML injection Commonly known as: (VITAMIN B-12)   linezolid 600 MG tablet Commonly known as: ZYVOX       TAKE these medications    acetaminophen 325 MG tablet Commonly known as: TYLENOL Take 2 tablets (650 mg total) by mouth every 6 (six) hours as needed for mild pain, fever or headache (or Fever >/= 101).   clotrimazole-betamethasone cream Commonly known as: LOTRISONE Apply 1 application topically in the morning and at bedtime.   HYDROmorphone 4 MG tablet Commonly known as: DILAUDID Take 1 tablet (4 mg total) by mouth every 4 (four) hours as needed for severe pain.   lactose free nutrition Liqd Take 237 mLs by  mouth 3 (three) times daily between meals.   oxyCODONE 5 MG immediate release tablet Commonly known as: Oxy IR/ROXICODONE Take 1-2 tablets (5-10 mg total) by mouth every 4 (four) hours as needed for severe pain.   polyethylene glycol 17 g packet Commonly known as: MIRALAX / GLYCOLAX Take 17 g by mouth daily as needed for mild constipation.        Allergies  Allergen Reactions   Ciprofloxacin Hives   Cefepime Rash   Zofran [Ondansetron] Rash     Procedures /Studies:   CT ABDOMEN PELVIS WO CONTRAST  Result Date: 02/03/2021 CLINICAL DATA:  Metastatic bladder carcinoma. Previous cystoprostatectomy, chemotherapy, and radiation therapy. EXAM: CT CHEST, ABDOMEN AND PELVIS WITHOUT CONTRAST TECHNIQUE:  Multidetector CT imaging of the chest, abdomen and pelvis was performed following the standard protocol without IV contrast. COMPARISON:  Chest CT on 10/15/2019 and AP CT on 06/16/2020 FINDINGS: CT CHEST FINDINGS Cardiovascular: No acute findings. Aortic and coronary atherosclerotic calcification noted. Mediastinum/Lymph Nodes: 1.4 cm subcarinal lymph node is seen, increased from 7 mm on prior study. No other pathologically enlarged lymph nodes identified on this unenhanced exam. Lungs/Pleura: Near complete resolution of right pleural effusion since prior study. A large irregular multinodular confluent opacity is seen in the right lung apex, with largest area measuring 2.7 x 2.7 cm on image 20/3. This remains unchanged in size and appearance since prior study. Previously noted sub-solid nodule in the posterior perihilar right upper lobe has decreased in size, consistent with resolving inflammatory or infectious etiology. A spiculated nodule is also seen in the anterior left upper lobe measuring 11 x 8 mm, also without change since prior exam. No new or enlarging pulmonary nodules or masses identified. Musculoskeletal:  No suspicious bone lesions identified. CT ABDOMEN AND PELVIS FINDINGS Hepatobiliary: No masses visualized on this unenhanced exam. A few small fluid attenuation hepatic cysts remains stable. Gallbladder is unremarkable. No evidence of biliary ductal dilatation. Pancreas: No mass or inflammatory changes identified on this unenhanced exam. Spleen:  Within normal limits in size. Adrenals/Urinary Tract: Stable postop changes from cystoprostatectomy with ileal loop diversion. No evidence of urolithiasis or hydronephrosis. Stomach/Bowel: No evidence of obstruction, inflammatory process, or abnormal fluid collections. Normal appendix visualized. Moderate colonic stool burden is unchanged. Vascular/Lymphatic: No pathologically enlarged lymph nodes identified. No abdominal aortic aneurysm. Aortic  atherosclerotic calcification noted. Reproductive: Stable postop changes from cystoprostatectomy. No evidence of recurrent mass. Other:  None. Musculoskeletal: Lytic mass is again seen involving the right ischial tuberosity which is stable since previous study. No other suspicious bone lesions identified. 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 No other sites of abdominal or pelvic metastatic disease identified. Stable large multinodular confluent opacity in right lung apex, which remains indeterminate. Differential diagnosis includes scarring or post radiation changes, with malignancy considered less likely. Continued attention on follow-up imaging is recommended. Stable 11 mm spiculated nodule in anterior left upper lobe. This does not have typical appearance of pulmonary metastasis, and a primary bronchogenic carcinoma cannot be excluded. Increased size of 1.4 cm subcarinal lymph node. Metastatic disease cannot be excluded. Aortic Atherosclerosis (ICD10-I70.0). Electronically Signed   By: Marlaine Hind M.D.   On: 02/03/2021 16:17   CT Chest Wo Contrast  Result Date: 02/03/2021 CLINICAL DATA:  Metastatic bladder carcinoma. Previous cystoprostatectomy, chemotherapy, and radiation therapy. EXAM: CT CHEST, ABDOMEN AND PELVIS WITHOUT CONTRAST TECHNIQUE: Multidetector CT imaging of the chest, abdomen and pelvis was performed following the standard protocol  without IV contrast. COMPARISON:  Chest CT on 10/15/2019 and AP CT on 06/16/2020 FINDINGS: CT CHEST FINDINGS Cardiovascular: No acute findings. Aortic and coronary atherosclerotic calcification noted. Mediastinum/Lymph Nodes: 1.4 cm subcarinal lymph node is seen, increased from 7 mm on prior study. No other pathologically enlarged lymph nodes identified on this unenhanced exam. Lungs/Pleura: Near complete resolution of right pleural effusion since prior study. A large  irregular multinodular confluent opacity is seen in the right lung apex, with largest area measuring 2.7 x 2.7 cm on image 20/3. This remains unchanged in size and appearance since prior study. Previously noted sub-solid nodule in the posterior perihilar right upper lobe has decreased in size, consistent with resolving inflammatory or infectious etiology. A spiculated nodule is also seen in the anterior left upper lobe measuring 11 x 8 mm, also without change since prior exam. No new or enlarging pulmonary nodules or masses identified. Musculoskeletal:  No suspicious bone lesions identified. CT ABDOMEN AND PELVIS FINDINGS Hepatobiliary: No masses visualized on this unenhanced exam. A few small fluid attenuation hepatic cysts remains stable. Gallbladder is unremarkable. No evidence of biliary ductal dilatation. Pancreas: No mass or inflammatory changes identified on this unenhanced exam. Spleen:  Within normal limits in size. Adrenals/Urinary Tract: Stable postop changes from cystoprostatectomy with ileal loop diversion. No evidence of urolithiasis or hydronephrosis. Stomach/Bowel: No evidence of obstruction, inflammatory process, or abnormal fluid collections. Normal appendix visualized. Moderate colonic stool burden is unchanged. Vascular/Lymphatic: No pathologically enlarged lymph nodes identified. No abdominal aortic aneurysm. Aortic atherosclerotic calcification noted. Reproductive: Stable postop changes from cystoprostatectomy. No evidence of recurrent mass. Other:  None. Musculoskeletal: Lytic mass is again seen involving the right ischial tuberosity which is stable since previous study. No other suspicious bone lesions identified. 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 No other sites of abdominal or pelvic metastatic disease identified. Stable large multinodular confluent opacity in right lung apex, which remains  indeterminate. Differential diagnosis includes scarring or post radiation changes, with malignancy considered less likely. Continued attention on follow-up imaging is recommended. Stable 11 mm spiculated nodule in anterior left upper lobe. This does not have typical appearance of pulmonary metastasis, and a primary bronchogenic carcinoma cannot be excluded. Increased size of 1.4 cm subcarinal lymph node. Metastatic disease cannot be excluded. Aortic Atherosclerosis (ICD10-I70.0). Electronically Signed   By: Marlaine Hind M.D.   On: 02/03/2021 16:17   MR Brain W Wo Contrast  Result Date: 02/04/2021 CLINICAL DATA:  Bladder cancer. Assess treatment response. Of EXAM: MRI HEAD WITHOUT AND WITH CONTRAST TECHNIQUE: Multiplanar, multiecho pulse sequences of the brain and surrounding structures were obtained without and with intravenous contrast. CONTRAST:  7mL GADAVIST GADOBUTROL 1 MMOL/ML IV SOLN COMPARISON:  MRI brain without and with contrast 01/22/2018 FINDINGS: Brain: A heterogeneous predominantly T2 hyperintense lesion is centered in the left basal ganglia spanning the left caudate head, internal capsule, and lentiform nucleus. A 13 mm linear area of enhancement is present along the medial margin of this lesion. There is some restricted diffusion in the more lateral aspect of the lesion. Intrinsic T1 shortening is present in the inferior and posterior aspects of the lesion. There is no significant mass effect. No significant surrounding vasogenic edema is present. Moderate periventricular and subcortical T2 hyperintensities are present otherwise. Mild generalized atrophy is present. No significant extraaxial fluid collection is present. Mild white matter changes extend into the brainstem, more prominent on the left. The internal auditory canals are within  normal limits. The brainstem and cerebellum are within normal limits. No other pathologic enhancement is present. Vascular: Flow is present in the major  intracranial arteries. Skull and upper cervical spine: Mild disc disease is present at C3-4 without significant stenosis. Craniocervical junction is normal. Marrow signal is normal. Sinuses/Orbits: Some fluid is present in the mastoid air cells bilaterally. The paranasal sinuses and mastoid air cells are otherwise clear. The globes and orbits are within normal limits. IMPRESSION: 1. Heterogeneous predominantly T2 hyperintense lesion centered in the left basal ganglia spanning the left caudate head, internal capsule, and lentiform nucleus. This may represent a late subacute/early chronic infarct. Malignancy is considered less likely unless this is a known lesion that has already been treated. In the absence of that history, short-term follow-up MRI without and with contrast at 4-6 weeks is recommended to evaluate evolution of this lesion to better define the possibility of infarct. 2. Intrinsic T1 shortening in the inferior and posterior aspects of the lesion without significant mass effect. This could also be seen in the setting of infarct. There is no susceptibility to suggest blood products. 3. Mild generalized atrophy and moderate white matter disease otherwise likely reflects the sequela of chronic microvascular ischemia. 4. Bilateral mastoid effusions. No obstructing nasopharyngeal lesion is present. Electronically Signed   By: San Morelle M.D.   On: 02/04/2021 06:36    Subjective:   Patient was seen and examined 02/08/2021, 10:53 AM Patient stable today. No acute distress.  No issues overnight Stable for discharge.  Discharge Exam:    Vitals:   02/07/21 0800 02/07/21 0900 02/07/21 2142 02/08/21 0659  BP: 130/61  126/71 130/75  Pulse: 70 89 77 79  Resp:   16 18  Temp:  98.6 F (37 C) 98.2 F (36.8 C)   TempSrc:  Oral Oral   SpO2: 94% 96% 98% 93%  Weight:      Height:        General: Pt lying comfortably in bed & appears in no obvious distress. Cardiovascular: S1 & S2 heard,  RRR, S1/S2 +. No murmurs, rubs, gallops or clicks. No JVD or pedal edema. Respiratory: Clear to auscultation without wheezing, rhonchi or crackles. No increased work of breathing. Abdominal:  Non-distended, non-tender & soft. No organomegaly or masses appreciated. Normal bowel sounds heard. CNS: Alert and oriented. No focal deficits. Extremities: no edema, no cyanosis      The results of significant diagnostics from this hospitalization (including imaging, microbiology, ancillary and laboratory) are listed below for reference.      Microbiology:   Recent Results (from the past 240 hour(s))  Culture, blood (single)     Status: None (Preliminary result)   Collection Time: 02/05/21  2:12 PM   Specimen: BLOOD  Result Value Ref Range Status   Specimen Description BLOOD LEFT HAND  Final   Special Requests   Final    BOTTLES DRAWN AEROBIC AND ANAEROBIC Blood Culture adequate volume   Culture   Final    NO GROWTH 2 DAYS Performed at [facility], [address], [place], [place] [address]    Report Status PENDING  Incomplete  Urine culture     Status: Abnormal (Preliminary result)   Collection Time: 02/05/21  3:07 PM   Specimen: Urine, Catheterized  Result Value Ref Range Status   Specimen Description   Final    URINE, CATHETERIZED Performed at [facility], [address] [place], [place] [address]    Special Requests   Final  NONE Performed at [facility], [address], [place], [place] [address]    Culture (A)  Final    >=100,000 COLONIES/mL GRAM NEGATIVE RODS CULTURE REINCUBATED FOR BETTER GROWTH SUSCEPTIBILITIES TO FOLLOW Performed at [facility] [address], [place], [place] [address]    Report Status PENDING  Incomplete  Culture, blood (x 2)     Status: None (Preliminary result)   Collection Time: 02/05/21  3:47 PM   Specimen: BLOOD RIGHT ARM  Result Value Ref Range Status   Specimen Description   Final    BLOOD RIGHT ARM BOTTLES DRAWN AEROBIC AND  ANAEROBIC   Special Requests Blood Culture adequate volume  Final   Culture   Final    NO GROWTH 2 DAYS Performed at [facility], [address] [place], [place] [address]    Report Status PENDING  Incomplete  MRSA Next Gen by PCR, Nasal     Status: None   Collection Time: 02/05/21 10:44 PM   Specimen: Nasal Mucosa; Nasal Swab  Result Value Ref Range Status   MRSA by PCR Next Gen NOT DETECTED NOT DETECTED Final    Comment: (NOTE) The GeneXpert MRSA Assay (FDA approved for NASAL specimens only), is one component of a comprehensive MRSA colonization surveillance program. It is not intended to diagnose MRSA infection nor to guide or monitor treatment for MRSA infections. Test performance is not FDA approved in patients less than 52 years old. Performed at [facility], [address], [place], [place] [address]   Resp Panel by RT-PCR (Flu A&B, Covid) Nasopharyngeal Swab     Status: None   Collection Time: 02/07/21  1:33 PM   Specimen: Nasopharyngeal Swab; Nasopharyngeal(NP) swabs in vial transport medium  Result Value Ref Range Status   SARS Coronavirus 2 by RT PCR NEGATIVE NEGATIVE Final    Comment: (NOTE) SARS-CoV-2 target nucleic acids are NOT DETECTED.  The SARS-CoV-2 RNA is generally detectable in upper respiratory specimens during the acute phase of infection. The lowest concentration of SARS-CoV-2 viral copies this assay can detect is 138 copies/mL. A negative result does not preclude SARS-Cov-2 infection and should not be used as the sole basis for treatment or other patient management decisions. A negative result may occur with  improper specimen collection/handling, submission of specimen other than nasopharyngeal swab, presence of viral mutation(s) within the areas targeted by this assay, and inadequate number of viral copies(<138 copies/mL). A negative result must be combined with clinical observations, patient history, and epidemiological information. The expected  result is Negative.  Fact Sheet for Patients:  EntrepreneurPulse.com.au  Fact Sheet for Healthcare Providers:  IncredibleEmployment.be  This test is no t yet approved or cleared by the [place] FDA and  has been authorized for detection and/or diagnosis of SARS-CoV-2 by FDA under an Emergency Use Authorization (EUA). This EUA will remain  in effect (meaning this test can be used) for the duration of the COVID-19 declaration under Section 564(b)(1) of the Act, 21 U.S.C.section 360bbb-3(b)(1), unless the authorization is terminated  or revoked sooner.       Influenza A by PCR NEGATIVE NEGATIVE Final   Influenza B by PCR NEGATIVE NEGATIVE Final    Comment: (NOTE) The Xpert Xpress SARS-CoV-2/FLU/RSV plus assay is intended as an aid in the diagnosis of influenza from Nasopharyngeal swab specimens and should not be used as a sole basis for treatment. Nasal washings and aspirates are unacceptable for Xpert Xpress SARS-CoV-2/FLU/RSV testing.  Fact Sheet for Patients: EntrepreneurPulse.com.au  Fact  Sheet for Healthcare Providers: IncredibleEmployment.be  This test is not yet approved or cleared by the [place] and has been authorized for detection and/or diagnosis of SARS-CoV-2 by FDA under an Emergency Use Authorization (EUA). This EUA will remain in effect (meaning this test can be used) for the duration of the COVID-19 declaration under Section 564(b)(1) of the Act, 21 U.S.C. section 360bbb-3(b)(1), unless the authorization is terminated or revoked.  Performed at [facility], [address] [place], [place] [address]      Labs:   CBC: Recent Labs  Lab 02/05/21 1216 02/06/21 0356 02/07/21 0359 02/08/21 0358  WBC 20.4* 17.2* 7.0 4.1  NEUTROABS 17.0* 14.7* 5.6 2.8  HGB 13.6 11.8* 11.5* 11.5*  HCT 41.4 36.8* 35.5* 34.9*  MCV 108.7* 112.9* 109.9* 109.7*  PLT 182 140* 136* 149*    Basic Metabolic Panel: Recent Labs  Lab 02/05/21 1216 02/06/21 0356 02/07/21 0359 02/08/21 0358  NA 137 141 139 138  K 3.7 4.0 4.0 3.7  CL 103 109 106 104  CO2 28 28 29 28   GLUCOSE 130* 106* 92 100*  BUN 21 18 15 13   CREATININE 1.15 1.02 0.94 0.91  CALCIUM 8.7* 8.2* 8.2* 8.0*   Liver Function Tests: Recent Labs  Lab 02/05/21 1216 02/06/21 0356 02/07/21 0359 02/08/21 0358  AST 14* 12* 20 23  ALT 14 12 16 21   ALKPHOS 50 39 38 38  BILITOT 1.5* 1.2 0.5 0.6  PROT 5.7* 4.8* 4.7* 4.9*  ALBUMIN 2.9* 2.5* 2.4* 2.4*   BNP (last 3 results) Recent Labs    02/05/21 1216  BNP 37.0   Cardiac Enzymes: No results for input(s): CKTOTAL, CKMB, CKMBINDEX, TROPONINI in the last 168 hours. CBG: Recent Labs  Lab 02/06/21 0721 02/07/21 0744 02/08/21 0719  GLUCAP 98 98 100*   Hgb A1c No results for input(s): HGBA1C in the last 72 hours. Lipid Profile No results for input(s): CHOL, HDL, LDLCALC, TRIG, CHOLHDL, LDLDIRECT in the last 72 hours. Thyroid function studies No results for input(s): TSH, T4TOTAL, T3FREE, THYROIDAB in the last 72 hours.  Invalid input(s): FREET3 Anemia work up No results for input(s): VITAMINB12, FOLATE, FERRITIN, TIBC, IRON, RETICCTPCT in the last 72 hours. Urinalysis    Component Value Date/Time   COLORURINE YELLOW 02/05/2021 1359   APPEARANCEUR CLOUDY (A) 02/05/2021 1359   LABSPEC 1.012 02/05/2021 1359   PHURINE 6.0 02/05/2021 1359   GLUCOSEU NEGATIVE 02/05/2021 1359   HGBUR SMALL (A) 02/05/2021 1359   BILIRUBINUR NEGATIVE 02/05/2021 1359   KETONESUR NEGATIVE 02/05/2021 1359   PROTEINUR 100 (A) 02/05/2021 1359   NITRITE POSITIVE (A) 02/05/2021 1359   LEUKOCYTESUR LARGE (A) 02/05/2021 1359         Time coordinating discharge: Over 45 minutes  SIGNED: Deatra James, MD, FACP, [facility],  Please use [email or web address] to Page If 7PM-7AM, please contact night-coverage [email or web address] [email or web address] 02/08/2021, 10:53 AM

## 2021-02-08 NOTE — Progress Notes (Signed)
Pharmacy Antibiotic Note  [name] is a 80 y.o. male admitted on 02/05/2021 with UTI.  Pharmacy has been consulted for Meropenem dosing. WBC improved, AF. UCX GNR, awaiting ID  Plan: Continue Meropenem 1000 mg IV every 8 hours. Monitor labs, c/s, and patient improvement.  Height: 5\' 11"  (180.3 cm) Weight: 92.1 kg (203 lb 0.7 oz) IBW/kg (Calculated) : 75.3  Temp (24hrs), Avg:98.2 F (36.8 C), Min:98.2 F (36.8 C), Max:98.2 F (36.8 C)  Recent Labs  Lab 02/05/21 1216 02/05/21 1547 02/06/21 0356 02/06/21 0746 02/07/21 0359 02/08/21 0358  WBC 20.4*  --  17.2*  --  7.0 4.1  CREATININE 1.15  --  1.02  --  0.94 0.91  LATICACIDVEN 0.8 2.2*  --  1.2  --   --      Estimated Creatinine Clearance: 76.3 mL/min (by C-G formula based on SCr of 0.91 mg/dL).    Allergies  Allergen Reactions   Ciprofloxacin Hives   Cefepime Rash   Zofran [Ondansetron] Rash    Antimicrobials this admission: Meropenem 6/23 >>  6/23 BCX: ngtd 6/23 Ucx: >100K CFU/ml GNR 6/23 MRSA PCR is negative 07/13/20 Ucx: pseudomonas and VRE  Thank you for allowing pharmacy to be a part of this patient's care.  Isac Sarna, BS Pharm D, [place] Clinical Pharmacist Pager [phone] 02/08/2021 9:52 AM

## 2021-02-10 DIAGNOSIS — Z6829 Body mass index (BMI) 29.0-29.9, adult: Secondary | ICD-10-CM | POA: Diagnosis not present

## 2021-02-10 DIAGNOSIS — E559 Vitamin D deficiency, unspecified: Secondary | ICD-10-CM | POA: Diagnosis not present

## 2021-02-10 DIAGNOSIS — G629 Polyneuropathy, unspecified: Secondary | ICD-10-CM | POA: Diagnosis not present

## 2021-02-10 DIAGNOSIS — E663 Overweight: Secondary | ICD-10-CM | POA: Diagnosis not present

## 2021-02-10 DIAGNOSIS — C7801 Secondary malignant neoplasm of right lung: Secondary | ICD-10-CM | POA: Diagnosis not present

## 2021-02-10 DIAGNOSIS — E538 Deficiency of other specified B group vitamins: Secondary | ICD-10-CM | POA: Diagnosis not present

## 2021-02-10 DIAGNOSIS — N390 Urinary tract infection, site not specified: Secondary | ICD-10-CM | POA: Diagnosis not present

## 2021-02-10 DIAGNOSIS — J449 Chronic obstructive pulmonary disease, unspecified: Secondary | ICD-10-CM | POA: Diagnosis not present

## 2021-02-10 DIAGNOSIS — A419 Sepsis, unspecified organism: Secondary | ICD-10-CM | POA: Diagnosis not present

## 2021-02-10 LAB — URINE CULTURE: Culture: >=100000 — AB

## 2021-02-10 LAB — CULTURE, BLOOD (ROUTINE X 2)
Culture: NO GROWTH
Special Requests: ADEQUATE

## 2021-02-10 LAB — CULTURE, BLOOD (SINGLE)
Culture: NO GROWTH
Special Requests: ADEQUATE

## 2021-02-17 DIAGNOSIS — L501 Idiopathic urticaria: Secondary | ICD-10-CM | POA: Diagnosis not present

## 2021-02-18 ENCOUNTER — Ambulatory Visit (INDEPENDENT_AMBULATORY_CARE_PROVIDER_SITE_OTHER): Payer: Medicare Other

## 2021-02-18 ENCOUNTER — Other Ambulatory Visit: Payer: Self-pay

## 2021-02-18 DIAGNOSIS — L501 Idiopathic urticaria: Secondary | ICD-10-CM | POA: Diagnosis not present

## 2021-02-21 ENCOUNTER — Other Ambulatory Visit: Payer: Self-pay | Admitting: Allergy & Immunology

## 2021-02-24 ENCOUNTER — Other Ambulatory Visit: Payer: Medicare Other

## 2021-03-03 ENCOUNTER — Other Ambulatory Visit: Payer: Self-pay

## 2021-03-03 ENCOUNTER — Inpatient Hospital Stay: Payer: Medicare Other | Attending: Oncology | Admitting: Oncology

## 2021-03-03 VITALS — BP 138/74 | HR 73 | Temp 98.0°F | Resp 18 | Ht 71.0 in | Wt 207.1 lb

## 2021-03-03 DIAGNOSIS — C770 Secondary and unspecified malignant neoplasm of lymph nodes of head, face and neck: Secondary | ICD-10-CM | POA: Insufficient documentation

## 2021-03-03 DIAGNOSIS — C7951 Secondary malignant neoplasm of bone: Secondary | ICD-10-CM | POA: Diagnosis not present

## 2021-03-03 DIAGNOSIS — G893 Neoplasm related pain (acute) (chronic): Secondary | ICD-10-CM | POA: Insufficient documentation

## 2021-03-03 DIAGNOSIS — Z9221 Personal history of antineoplastic chemotherapy: Secondary | ICD-10-CM | POA: Diagnosis not present

## 2021-03-03 DIAGNOSIS — C679 Malignant neoplasm of bladder, unspecified: Secondary | ICD-10-CM | POA: Insufficient documentation

## 2021-03-03 DIAGNOSIS — L501 Idiopathic urticaria: Secondary | ICD-10-CM | POA: Diagnosis not present

## 2021-03-03 NOTE — Progress Notes (Signed)
Hematology and Oncology Follow Up   [name] [id number] 07-06-1941 80 y.o. 03/03/2021 1:18 PM [name], MDFusco, Purcell Nails, MD       Principle Diagnosis: 80 year old man with bladder cancer diagnosed in 2018.  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  His PDL 1 positive  with CPS score over 90%.    Prior Therapy:    He is status post neoadjuvant chemotherapy utilizing gemcitabine and cisplatin started in September 2018.  He received day 1 of cycle 1 on 05/02/2017 and therapy discontinued after that.   He is status post laparoscopic Cystoprostatectomy and bilateral lymphadenectomy done by Dr. Tresa Moore on 06/29/2017. Final pathology showed a T3bN0 disease.  He has 13 lymph nodes sampled without any evidence of malignancy.   He completed radiation therapy to the pelvis on January 16, 2018 under the care of Dr. Tammi Klippel.     Carboplatin and gemcitabine he is status post 6 cycles of therapy completed on 05/22/2018.   Pembrolizumab 200 mg every 3 weeks started on 06/21/2018.  He completed 8 cycles of therapy and April 2020.  Therapy discontinued because of dermatological toxicities.   He is status post radiation therapy to the right paratracheal lymph node for a total of 50 Gray in 10 fractions completed on April 23, 2020.    Current therapy:  Active surveillance.   Interim History: [name] returns today for a follow-up visit.  Since the last visit, he was hospitalized for urinary tract infection and urosepsis and has recovered at this time.  Since his discharge, he is taking oral antibiotics for suppressive purposes without any recent issues.  He does report some fatigue and tiredness but otherwise no other complaints.  He denies any fevers, chills or sweats.  He is eating better and has gained weight.  He denies any hematuria or dysuria.  Medications: Updated after review. Current Outpatient Medications  Medication  Sig Dispense Refill   acetaminophen (TYLENOL) 325 MG tablet Take 2 tablets (650 mg total) by mouth every 6 (six) hours as needed for mild pain, fever or headache (or Fever >/= 101). 12 tablet 0   clotrimazole-betamethasone (LOTRISONE) cream Apply 1 application topically in the morning and at bedtime.     HYDROmorphone (DILAUDID) 4 MG tablet Take 1 tablet (4 mg total) by mouth every 4 (four) hours as needed for severe pain. 120 tablet 0   lactose free nutrition (BOOST) LIQD Take 237 mLs by mouth 3 (three) times daily between meals.      oxyCODONE (OXY IR/ROXICODONE) 5 MG immediate release tablet Take 1-2 tablets (5-10 mg total) by mouth every 4 (four) hours as needed for severe pain. 120 tablet 0   polyethylene glycol (MIRALAX / GLYCOLAX) 17 g packet Take 17 g by mouth daily as needed for mild constipation. 14 each 0   Current Facility-Administered Medications  Medication Dose Route Frequency Provider Last Rate Last Admin   omalizumab Arvid Right) injection 300 mg  300 mg Subcutaneous Q14 Days Valentina Shaggy, MD   300 mg at 02/18/21 1950     Allergies:  Allergies  Allergen Reactions   Ciprofloxacin Hives   Cefepime Rash   Zofran [Ondansetron] Rash   Physical exam:  Blood pressure 138/74, pulse 73, temperature 98 F (36.7 C), temperature source Oral, resp. rate 18, height '5\' 11"'  (1.803 m), weight 207 lb 1.6 oz (93.9 kg), SpO2 97 %.    ECOG 1     General appearance: Alert,  awake without any distress. Head: Atraumatic without abnormalities Oropharynx: Without any thrush or ulcers. Eyes: No scleral icterus. Lymph nodes: No lymphadenopathy noted in the cervical, supraclavicular, or axillary nodes Heart:regular rate and rhythm, without any murmurs or gallops.   Lung: Clear to auscultation without any rhonchi, wheezes or dullness to percussion. Abdomin: Soft, nontender without any shifting dullness or ascites. Musculoskeletal: No clubbing or cyanosis. Neurological: No motor or  sensory deficits. Skin: No rashes or lesions. Psychiatric: Mood and affect appeared normal.         Lab Results: Lab Results  Component Value Date   WBC 4.1 02/08/2021   HGB 11.5 (L) 02/08/2021   HCT 34.9 (L) 02/08/2021   MCV 109.7 (H) 02/08/2021   PLT 149 (L) 02/08/2021     Chemistry      Component Value Date/Time   NA 138 02/08/2021 0358   NA 140 04/30/2019 1033   NA 141 05/23/2017 0828   K 3.7 02/08/2021 0358   K 4.0 05/23/2017 0828   CL 104 02/08/2021 0358   CO2 28 02/08/2021 0358   CO2 27 05/23/2017 0828   BUN 13 02/08/2021 0358   BUN 16 04/30/2019 1033   BUN 12.5 05/23/2017 0828   CREATININE 0.91 02/08/2021 0358   CREATININE 0.91 01/05/2021 0950   CREATININE 0.8 05/23/2017 0828      Component Value Date/Time   CALCIUM 8.0 (L) 02/08/2021 0358   CALCIUM 9.0 05/23/2017 0828   ALKPHOS 38 02/08/2021 0358   ALKPHOS 57 05/23/2017 0828   AST 23 02/08/2021 0358   AST 16 01/05/2021 0950   AST 13 05/23/2017 0828   ALT 21 02/08/2021 0358   ALT 26 01/05/2021 0950   ALT 15 05/23/2017 0828   BILITOT 0.6 02/08/2021 0358   BILITOT 0.5 01/05/2021 0950   BILITOT 0.37 05/23/2017 0828      IMPRESSION: 1. Heterogeneous predominantly T2 hyperintense lesion centered in the left basal ganglia spanning the left caudate head, internal capsule, and lentiform nucleus. This may represent a late subacute/early chronic infarct. Malignancy is considered less likely unless this is a known lesion that has already been treated. In the absence of that history, short-term follow-up MRI without and with contrast at 4-6 weeks is recommended to evaluate evolution of this lesion to better define the possibility of infarct. 2. Intrinsic T1 shortening in the inferior and posterior aspects of the lesion without significant mass effect. This could also be seen in the setting of infarct. There is no susceptibility to suggest blood products. 3. Mild generalized atrophy and moderate white  matter disease otherwise likely reflects the sequela of chronic microvascular ischemia. 4. Bilateral mastoid effusions. No obstructing nasopharyngeal lesion is present.    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 No other sites of abdominal or pelvic metastatic disease identified.   Stable large multinodular confluent opacity in right lung apex, which remains indeterminate. Differential diagnosis includes scarring or post radiation changes, with malignancy considered less likely. Continued attention on follow-up imaging is recommended.   Stable 11 mm spiculated nodule in anterior left upper lobe. This does not have typical appearance of pulmonary metastasis, and a primary bronchogenic carcinoma cannot be excluded.   Increased size of 1.4 cm subcarinal lymph node. Metastatic disease cannot be excluded.   Aortic Atherosclerosis (ICD10-I70.0).   Impression and Plan:  80 year old man with:    1.   Stage IV high-grade urothelial carcinoma of the bladder diagnosed in 2019 with  high PD-L1 score.   He is currently on active surveillance after achieving a near complete response to immunotherapy.  Imaging studies obtained in June 2022 showed no clear evidence of disease progression.  Risks and benefits as well as the natural course of this disease and treatment choices were reviewed.  At this time opted for the option of systemic therapy unless he has clear progression of disease.  Therapy could be reinstituting immunotherapy versus antibiotic drug conjugate versus oral targeted therapy.  He is agreeable to proceed with this plan.   2. Hip pain: Related to previous malignancy and surgery.  Pain is adequately controlled with Dilaudid and oxycodone.     3.  Goals of care: I continue to emphasize the need for note that his disease is incurable at some point require different salvage therapy.   4.    Recurrent urinary tract infection: He is currently on oral antibiotic for suppressive purposes   5.  Follow-up: In 3 months for repeat evaluation in 6 months to repeat imaging studies.   30  minutes were spent on this visit.  Time was dedicated to reviewing laboratory data, disease status update, treatment choices and future plan of care discussion.   Zola Button, MD 03/03/2021 1:18 PM

## 2021-03-04 ENCOUNTER — Ambulatory Visit (INDEPENDENT_AMBULATORY_CARE_PROVIDER_SITE_OTHER): Payer: Medicare Other | Admitting: *Deleted

## 2021-03-04 DIAGNOSIS — L501 Idiopathic urticaria: Secondary | ICD-10-CM

## 2021-03-04 MED ORDER — OMALIZUMAB 150 MG/ML ~~LOC~~ SOSY
300.0000 mg | PREFILLED_SYRINGE | SUBCUTANEOUS | Status: DC
  Administered 2021-03-04 – 2021-04-01 (×2): 300 mg via SUBCUTANEOUS

## 2021-03-10 ENCOUNTER — Other Ambulatory Visit: Payer: Self-pay | Admitting: Oncology

## 2021-03-10 DIAGNOSIS — C679 Malignant neoplasm of bladder, unspecified: Secondary | ICD-10-CM

## 2021-03-15 DIAGNOSIS — I10 Essential (primary) hypertension: Secondary | ICD-10-CM | POA: Diagnosis not present

## 2021-03-15 DIAGNOSIS — G894 Chronic pain syndrome: Secondary | ICD-10-CM | POA: Diagnosis not present

## 2021-03-17 DIAGNOSIS — L501 Idiopathic urticaria: Secondary | ICD-10-CM | POA: Diagnosis not present

## 2021-03-18 ENCOUNTER — Ambulatory Visit (INDEPENDENT_AMBULATORY_CARE_PROVIDER_SITE_OTHER): Payer: Medicare Other

## 2021-03-18 ENCOUNTER — Other Ambulatory Visit: Payer: Self-pay

## 2021-03-18 DIAGNOSIS — L501 Idiopathic urticaria: Secondary | ICD-10-CM

## 2021-03-31 DIAGNOSIS — L501 Idiopathic urticaria: Secondary | ICD-10-CM | POA: Diagnosis not present

## 2021-04-01 ENCOUNTER — Other Ambulatory Visit: Payer: Self-pay

## 2021-04-01 ENCOUNTER — Ambulatory Visit (INDEPENDENT_AMBULATORY_CARE_PROVIDER_SITE_OTHER): Payer: Medicare Other

## 2021-04-01 DIAGNOSIS — L501 Idiopathic urticaria: Secondary | ICD-10-CM

## 2021-04-14 DIAGNOSIS — L501 Idiopathic urticaria: Secondary | ICD-10-CM | POA: Diagnosis not present

## 2021-04-15 ENCOUNTER — Ambulatory Visit (INDEPENDENT_AMBULATORY_CARE_PROVIDER_SITE_OTHER): Payer: Medicare Other

## 2021-04-15 ENCOUNTER — Other Ambulatory Visit: Payer: Self-pay

## 2021-04-15 DIAGNOSIS — L501 Idiopathic urticaria: Secondary | ICD-10-CM

## 2021-04-15 MED ORDER — OMALIZUMAB 150 MG/ML ~~LOC~~ SOSY
300.0000 mg | PREFILLED_SYRINGE | SUBCUTANEOUS | Status: AC
  Administered 2021-04-15 – 2022-11-24 (×38): 300 mg via SUBCUTANEOUS

## 2021-04-21 DIAGNOSIS — J329 Chronic sinusitis, unspecified: Secondary | ICD-10-CM | POA: Diagnosis not present

## 2021-04-21 DIAGNOSIS — Z6829 Body mass index (BMI) 29.0-29.9, adult: Secondary | ICD-10-CM | POA: Diagnosis not present

## 2021-04-21 DIAGNOSIS — E663 Overweight: Secondary | ICD-10-CM | POA: Diagnosis not present

## 2021-04-28 DIAGNOSIS — L501 Idiopathic urticaria: Secondary | ICD-10-CM | POA: Diagnosis not present

## 2021-04-29 ENCOUNTER — Ambulatory Visit (INDEPENDENT_AMBULATORY_CARE_PROVIDER_SITE_OTHER): Payer: Medicare Other | Admitting: *Deleted

## 2021-04-29 ENCOUNTER — Other Ambulatory Visit: Payer: Self-pay

## 2021-04-29 DIAGNOSIS — L501 Idiopathic urticaria: Secondary | ICD-10-CM

## 2021-05-12 DIAGNOSIS — L501 Idiopathic urticaria: Secondary | ICD-10-CM | POA: Diagnosis not present

## 2021-05-13 ENCOUNTER — Ambulatory Visit (INDEPENDENT_AMBULATORY_CARE_PROVIDER_SITE_OTHER): Payer: Medicare Other

## 2021-05-13 ENCOUNTER — Other Ambulatory Visit: Payer: Self-pay

## 2021-05-13 DIAGNOSIS — L501 Idiopathic urticaria: Secondary | ICD-10-CM | POA: Diagnosis not present

## 2021-05-18 ENCOUNTER — Other Ambulatory Visit: Payer: Self-pay | Admitting: Oncology

## 2021-05-18 DIAGNOSIS — C679 Malignant neoplasm of bladder, unspecified: Secondary | ICD-10-CM

## 2021-05-18 MED ORDER — OXYCODONE HCL 5 MG PO TABS: ORAL_TABLET | ORAL | 0 refills | Status: DC

## 2021-05-18 MED ORDER — HYDROMORPHONE HCL 4 MG PO TABS: 4.0000 mg | ORAL_TABLET | ORAL | 0 refills | Status: DC | PRN

## 2021-05-26 DIAGNOSIS — L501 Idiopathic urticaria: Secondary | ICD-10-CM | POA: Diagnosis not present

## 2021-05-27 ENCOUNTER — Ambulatory Visit (INDEPENDENT_AMBULATORY_CARE_PROVIDER_SITE_OTHER): Payer: Medicare Other

## 2021-05-27 ENCOUNTER — Other Ambulatory Visit: Payer: Self-pay

## 2021-05-27 DIAGNOSIS — L501 Idiopathic urticaria: Secondary | ICD-10-CM

## 2021-06-01 ENCOUNTER — Ambulatory Visit: Payer: Medicare Other

## 2021-06-03 ENCOUNTER — Inpatient Hospital Stay: Payer: Medicare Other

## 2021-06-03 ENCOUNTER — Inpatient Hospital Stay: Payer: Medicare Other | Attending: Oncology

## 2021-06-03 ENCOUNTER — Other Ambulatory Visit: Payer: Self-pay

## 2021-06-03 ENCOUNTER — Inpatient Hospital Stay (HOSPITAL_BASED_OUTPATIENT_CLINIC_OR_DEPARTMENT_OTHER): Payer: Medicare Other | Admitting: Oncology

## 2021-06-03 VITALS — BP 173/77 | HR 83 | Temp 97.6°F | Resp 18 | Wt 206.1 lb

## 2021-06-03 DIAGNOSIS — Z23 Encounter for immunization: Secondary | ICD-10-CM

## 2021-06-03 DIAGNOSIS — C679 Malignant neoplasm of bladder, unspecified: Secondary | ICD-10-CM | POA: Insufficient documentation

## 2021-06-03 DIAGNOSIS — C7951 Secondary malignant neoplasm of bone: Secondary | ICD-10-CM | POA: Diagnosis not present

## 2021-06-03 LAB — CBC WITH DIFFERENTIAL (CANCER CENTER ONLY)
Abs Immature Granulocytes: 0.08 10*3/uL — ABNORMAL HIGH (ref 0.00–0.07)
Basophils Absolute: 0 10*3/uL (ref 0.0–0.1)
Basophils Relative: 0 %
Eosinophils Absolute: 0 10*3/uL (ref 0.0–0.5)
Eosinophils Relative: 0 %
HCT: 45.5 % (ref 39.0–52.0)
Hemoglobin: 15.1 g/dL (ref 13.0–17.0)
Immature Granulocytes: 1 %
Lymphocytes Relative: 6 %
Lymphs Abs: 0.5 10*3/uL — ABNORMAL LOW (ref 0.7–4.0)
MCH: 34.7 pg — ABNORMAL HIGH (ref 26.0–34.0)
MCHC: 33.2 g/dL (ref 30.0–36.0)
MCV: 104.6 fL — ABNORMAL HIGH (ref 80.0–100.0)
Monocytes Absolute: 0.2 10*3/uL (ref 0.1–1.0)
Monocytes Relative: 3 %
Neutro Abs: 8.1 10*3/uL — ABNORMAL HIGH (ref 1.7–7.7)
Neutrophils Relative %: 90 %
Platelet Count: 206 10*3/uL (ref 150–400)
RBC: 4.35 MIL/uL (ref 4.22–5.81)
RDW: 12.9 % (ref 11.5–15.5)
WBC Count: 8.9 10*3/uL (ref 4.0–10.5)
nRBC: 0 % (ref 0.0–0.2)

## 2021-06-03 LAB — CMP (CANCER CENTER ONLY)
ALT: 15 U/L (ref 0–44)
AST: 15 U/L (ref 15–41)
Albumin: 3.3 g/dL — ABNORMAL LOW (ref 3.5–5.0)
Alkaline Phosphatase: 66 U/L (ref 38–126)
Anion gap: 12 (ref 5–15)
BUN: 15 mg/dL (ref 8–23)
CO2: 22 mmol/L (ref 22–32)
Calcium: 9.6 mg/dL (ref 8.9–10.3)
Chloride: 107 mmol/L (ref 98–111)
Creatinine: 1.01 mg/dL (ref 0.61–1.24)
GFR, Estimated: >60 mL/min (ref >60)
Glucose, Bld: 153 mg/dL — ABNORMAL HIGH (ref 70–99)
Potassium: 4.2 mmol/L (ref 3.5–5.1)
Sodium: 141 mmol/L (ref 135–145)
Total Bilirubin: 0.5 mg/dL (ref 0.3–1.2)
Total Protein: 6.6 g/dL (ref 6.5–8.1)

## 2021-06-03 MED ORDER — INFLUENZA VAC A&B SA ADJ QUAD 0.5 ML IM PRSY
0.5000 mL | PREFILLED_SYRINGE | Freq: Once | INTRAMUSCULAR | Status: DC
  Filled 2021-06-03: qty 0.5

## 2021-06-03 MED ORDER — INFLUENZA VAC A&B SA ADJ QUAD 0.5 ML IM PRSY
0.5000 mL | PREFILLED_SYRINGE | Freq: Once | INTRAMUSCULAR | Status: AC
  Administered 2021-06-03: 0.5 mL via INTRAMUSCULAR

## 2021-06-03 NOTE — Addendum Note (Signed)
Addended by: Tora Kindred on: 06/03/2021 08:55 AM   Modules accepted: Orders

## 2021-06-03 NOTE — Progress Notes (Signed)
Hematology and Oncology Follow Up   [name] [id number] July 07, 1941 80 y.o. 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  His PDL 1 positive  with CPS score over 90% after initially diagnosed with localized tumor in 2018.   Prior Therapy:    He is status post neoadjuvant chemotherapy utilizing gemcitabine and cisplatin started in September 2018.  He received day 1 of cycle 1 on 05/02/2017 and therapy discontinued after that.   He is status post laparoscopic Cystoprostatectomy and bilateral lymphadenectomy done by Dr. Tresa Moore on 06/29/2017. Final pathology showed a T3bN0 disease.  He has 13 lymph nodes sampled without any evidence of malignancy.   He completed radiation therapy to the pelvis on January 16, 2018 under the care of Dr. Tammi Klippel.     Carboplatin and gemcitabine he is status post 6 cycles of therapy completed on 05/22/2018.   Pembrolizumab 200 mg every 3 weeks started on 06/21/2018.  He completed 8 cycles of therapy and April 2020.  Therapy discontinued because of dermatological toxicities.   He is status post radiation therapy to the right paratracheal lymph node for a total of 50 Gray in 10 fractions completed on April 23, 2020.    Current therapy:  Active surveillance.   Interim History: [name] is here for return evaluation.  Since the last visit, he reports feeling well without any major complaints.  He reports improvement in his energy and appetite and gained weight.  He denies any nausea, vomiting or abdominal pain.  He denies any worsening pruritus.  He continues to report chronic hip pain which has not dramatically changed.  Pain is manageable with Dilaudid and oxycodone.  Medications: Reviewed without changes. Current Outpatient Medications  Medication Sig Dispense Refill   acetaminophen  (TYLENOL) 325 MG tablet Take 2 tablets (650 mg total) by mouth every 6 (six) hours as needed for mild pain, fever or headache (or Fever >/= 101). 12 tablet 0   clotrimazole-betamethasone (LOTRISONE) cream Apply 1 application topically in the morning and at bedtime.     HYDROmorphone (DILAUDID) 4 MG tablet Take 1 tablet (4 mg total) by mouth every 4 (four) hours as needed for severe pain. 120 tablet 0   lactose free nutrition (BOOST) LIQD Take 237 mLs by mouth 3 (three) times daily between meals.      oxyCODONE (OXY IR/ROXICODONE) 5 MG immediate release tablet TAKE (1) TO (2) TABLETS BY MOUTH EVERY (4) HOURS AS NEEDED FOR SEVERE PAIN. 120 tablet 0   polyethylene glycol (MIRALAX / GLYCOLAX) 17 g packet Take 17 g by mouth daily as needed for mild constipation. 14 each 0   Current Facility-Administered Medications  Medication Dose Route Frequency Provider Last Rate Last Admin   omalizumab Arvid Right) prefilled syringe 300 mg  300 mg Subcutaneous Q14 Days Valentina Shaggy, MD   300 mg at 05/27/21 6440   Facility-Administered Medications Ordered in Other Visits  Medication Dose Route Frequency Provider Last Rate Last Admin   influenza vaccine adjuvanted (FLUAD) injection 0.5 mL  0.5 mL Intramuscular Once [name], MD       influenza vaccine adjuvanted (FLUAD) injection 0.5 mL  0.5 mL Intramuscular Once [name], MD         Allergies:  Allergies  Allergen Reactions   Ciprofloxacin Hives   Cefepime Rash   Zofran [  Ondansetron] Rash   Physical exam:  Blood pressure (!) 173/77, pulse 83, temperature 97.6 F (36.4 C), temperature source Oral, resp. rate 18, weight 206 lb 1 oz (93.5 kg), SpO2 96 %.    ECOG 1   General appearance: Comfortable appearing without any discomfort Head: Normocephalic without any trauma Oropharynx: Mucous membranes are moist and pink without any thrush or ulcers. Eyes: Pupils are equal and round reactive to light. Lymph nodes: No cervical,  supraclavicular, inguinal or axillary lymphadenopathy.   Heart:regular rate and rhythm.  S1 and S2 without leg edema. Lung: Clear without any rhonchi or wheezes.  No dullness to percussion. Abdomin: Soft, nontender, nondistended with good bowel sounds.  No hepatosplenomegaly. Musculoskeletal: No joint deformity or effusion.  Full range of motion noted. Neurological: No deficits noted on motor, sensory and deep tendon reflex exam. Skin: No petechial rash or dryness.  Appeared moist.          Lab Results: Lab Results  Component Value Date   WBC 8.9 06/03/2021   HGB 15.1 06/03/2021   HCT 45.5 06/03/2021   MCV 104.6 (H) 06/03/2021   PLT 206 06/03/2021     Chemistry      Component Value Date/Time   NA 141 06/03/2021 0807   NA 140 04/30/2019 1033   NA 141 05/23/2017 0828   K 4.2 06/03/2021 0807   K 4.0 05/23/2017 0828   CL 107 06/03/2021 0807   CO2 22 06/03/2021 0807   CO2 27 05/23/2017 0828   BUN 15 06/03/2021 0807   BUN 16 04/30/2019 1033   BUN 12.5 05/23/2017 0828   CREATININE 1.01 06/03/2021 0807   CREATININE 0.8 05/23/2017 0828      Component Value Date/Time   CALCIUM 9.6 06/03/2021 0807   CALCIUM 9.0 05/23/2017 0828   ALKPHOS 66 06/03/2021 0807   ALKPHOS 57 05/23/2017 0828   AST 15 06/03/2021 0807   AST 13 05/23/2017 0828   ALT 15 06/03/2021 0807   ALT 15 05/23/2017 0828   BILITOT 0.5 06/03/2021 0807   BILITOT 0.37 05/23/2017 0828        Impression and Plan:  80 year old man with:    1.   Bladder cancer diagnosed in 2018 he subsequently developed stage IV high-grade urothelial carcinoma in 2019.   His disease status was updated at this time and treatment choices were reviewed.  Treatment choices including restarting immunotherapy, antibiotic drug conjugate chemotherapy can be utilized if he has metastatic disease.  I will arrange for imaging studies to take place prior to the next visit.   2. Hip pain: Currently on oxycodone and Dilaudid with pain is  manageable.  His pain is related to metastatic disease.     3.  Pruritus: He is currently receiving allergy injections which might of contributed to the symptoms.  Symptoms have improved at this time.   4.   Weight loss and failure to thrive: Improved at this time continues to gain weight.  5.  Vaccination consideration: He will receive flu vaccine today and COVID booster next week.   6.  Follow-up: He will return in 3 months after updating his staging scans.   30  minutes were dedicated to this encounter.  Time was spent on reviewing laboratory data, disease status update and outlining future plan of care.   Zola Button, MD 06/03/2021 8:51 AM

## 2021-06-09 DIAGNOSIS — L501 Idiopathic urticaria: Secondary | ICD-10-CM | POA: Diagnosis not present

## 2021-06-10 ENCOUNTER — Ambulatory Visit (INDEPENDENT_AMBULATORY_CARE_PROVIDER_SITE_OTHER): Payer: Medicare Other

## 2021-06-10 ENCOUNTER — Other Ambulatory Visit: Payer: Self-pay

## 2021-06-10 DIAGNOSIS — L501 Idiopathic urticaria: Secondary | ICD-10-CM

## 2021-06-12 ENCOUNTER — Ambulatory Visit (INDEPENDENT_AMBULATORY_CARE_PROVIDER_SITE_OTHER): Payer: Medicare Other

## 2021-06-12 ENCOUNTER — Other Ambulatory Visit: Payer: Self-pay

## 2021-06-12 DIAGNOSIS — Z23 Encounter for immunization: Secondary | ICD-10-CM

## 2021-06-12 NOTE — Progress Notes (Signed)
   Covid-19 Vaccination Clinic  Name:  [name]    MRN: [id number] DOB: January 14, 1941  06/12/2021  [name] was observed post Covid-19 immunization for 15 minutes without incident. He was provided with Vaccine Information Sheet and instruction to access the V-Safe system.   [name] was instructed to call 911 with any severe reactions post vaccine: Difficulty breathing  Swelling of face and throat  A fast heartbeat  A bad rash all over body  Dizziness and weakness   Immunizations Administered     Name Date Dose VIS Date Route   Pfizer Covid-19 Vaccine Bivalent Booster 06/12/2021  8:48 AM 0.3 mL 04/15/2021 Intramuscular   Manufacturer: [place]   Lot: [id number]   [place]: [phone]

## 2021-06-23 DIAGNOSIS — L501 Idiopathic urticaria: Secondary | ICD-10-CM | POA: Diagnosis not present

## 2021-06-24 ENCOUNTER — Ambulatory Visit (INDEPENDENT_AMBULATORY_CARE_PROVIDER_SITE_OTHER): Payer: Medicare Other

## 2021-06-24 ENCOUNTER — Other Ambulatory Visit: Payer: Self-pay

## 2021-06-24 DIAGNOSIS — L501 Idiopathic urticaria: Secondary | ICD-10-CM | POA: Diagnosis not present

## 2021-06-25 ENCOUNTER — Other Ambulatory Visit: Payer: Self-pay | Admitting: *Deleted

## 2021-06-25 DIAGNOSIS — C679 Malignant neoplasm of bladder, unspecified: Secondary | ICD-10-CM

## 2021-06-25 MED ORDER — OXYCODONE HCL 5 MG PO TABS: ORAL_TABLET | ORAL | 0 refills | Status: DC

## 2021-06-25 MED ORDER — HYDROMORPHONE HCL 4 MG PO TABS: 4.0000 mg | ORAL_TABLET | ORAL | 0 refills | Status: DC | PRN

## 2021-07-03 ENCOUNTER — Ambulatory Visit (HOSPITAL_COMMUNITY): Payer: Medicare Other

## 2021-07-03 ENCOUNTER — Other Ambulatory Visit: Payer: Self-pay

## 2021-07-03 ENCOUNTER — Ambulatory Visit (HOSPITAL_COMMUNITY)
Admission: RE | Admit: 2021-07-03 | Discharge: 2021-07-03 | Disposition: A | Discharge status: Home / Self Care | Payer: Medicare Other | Source: Ambulatory Visit | Attending: Oncology | Admitting: Oncology

## 2021-07-03 DIAGNOSIS — C679 Malignant neoplasm of bladder, unspecified: Secondary | ICD-10-CM

## 2021-07-03 DIAGNOSIS — J90 Pleural effusion, not elsewhere classified: Secondary | ICD-10-CM | POA: Diagnosis not present

## 2021-07-03 DIAGNOSIS — C7951 Secondary malignant neoplasm of bone: Secondary | ICD-10-CM | POA: Diagnosis not present

## 2021-07-03 DIAGNOSIS — K7689 Other specified diseases of liver: Secondary | ICD-10-CM | POA: Diagnosis not present

## 2021-07-03 DIAGNOSIS — I700 Atherosclerosis of aorta: Secondary | ICD-10-CM | POA: Diagnosis not present

## 2021-07-03 DIAGNOSIS — K449 Diaphragmatic hernia without obstruction or gangrene: Secondary | ICD-10-CM | POA: Diagnosis not present

## 2021-07-03 DIAGNOSIS — R911 Solitary pulmonary nodule: Secondary | ICD-10-CM | POA: Diagnosis not present

## 2021-07-03 DIAGNOSIS — J439 Emphysema, unspecified: Secondary | ICD-10-CM | POA: Diagnosis not present

## 2021-07-06 ENCOUNTER — Telehealth: Payer: Self-pay | Admitting: *Deleted

## 2021-07-06 NOTE — Telephone Encounter (Signed)
Communicated ct results to patient and his wife.  No further questions.

## 2021-07-06 NOTE — Telephone Encounter (Signed)
-----   Message from [name], MD sent at 07/06/2021  8:49 AM EST ----- Please let him know his scan is unchanged and shows no cancer

## 2021-07-07 DIAGNOSIS — L501 Idiopathic urticaria: Secondary | ICD-10-CM | POA: Diagnosis not present

## 2021-07-08 ENCOUNTER — Other Ambulatory Visit: Payer: Self-pay

## 2021-07-08 ENCOUNTER — Ambulatory Visit (INDEPENDENT_AMBULATORY_CARE_PROVIDER_SITE_OTHER): Payer: Medicare Other | Admitting: *Deleted

## 2021-07-08 DIAGNOSIS — L501 Idiopathic urticaria: Secondary | ICD-10-CM | POA: Diagnosis not present

## 2021-07-21 DIAGNOSIS — L501 Idiopathic urticaria: Secondary | ICD-10-CM | POA: Diagnosis not present

## 2021-07-22 ENCOUNTER — Other Ambulatory Visit: Payer: Self-pay

## 2021-07-22 ENCOUNTER — Ambulatory Visit (INDEPENDENT_AMBULATORY_CARE_PROVIDER_SITE_OTHER): Payer: Medicare Other

## 2021-07-22 DIAGNOSIS — L501 Idiopathic urticaria: Secondary | ICD-10-CM

## 2021-07-29 DIAGNOSIS — Z125 Encounter for screening for malignant neoplasm of prostate: Secondary | ICD-10-CM | POA: Diagnosis not present

## 2021-07-29 DIAGNOSIS — G894 Chronic pain syndrome: Secondary | ICD-10-CM | POA: Diagnosis not present

## 2021-07-29 DIAGNOSIS — Z0001 Encounter for general adult medical examination with abnormal findings: Secondary | ICD-10-CM | POA: Diagnosis not present

## 2021-07-29 DIAGNOSIS — Z6830 Body mass index (BMI) 30.0-30.9, adult: Secondary | ICD-10-CM | POA: Diagnosis not present

## 2021-07-29 DIAGNOSIS — E782 Mixed hyperlipidemia: Secondary | ICD-10-CM | POA: Diagnosis not present

## 2021-07-29 DIAGNOSIS — E039 Hypothyroidism, unspecified: Secondary | ICD-10-CM | POA: Diagnosis not present

## 2021-07-29 DIAGNOSIS — I10 Essential (primary) hypertension: Secondary | ICD-10-CM | POA: Diagnosis not present

## 2021-07-29 DIAGNOSIS — Z1331 Encounter for screening for depression: Secondary | ICD-10-CM | POA: Diagnosis not present

## 2021-07-29 DIAGNOSIS — E6609 Other obesity due to excess calories: Secondary | ICD-10-CM | POA: Diagnosis not present

## 2021-07-29 DIAGNOSIS — C679 Malignant neoplasm of bladder, unspecified: Secondary | ICD-10-CM | POA: Diagnosis not present

## 2021-08-04 DIAGNOSIS — L501 Idiopathic urticaria: Secondary | ICD-10-CM | POA: Diagnosis not present

## 2021-08-05 ENCOUNTER — Other Ambulatory Visit: Payer: Self-pay

## 2021-08-05 ENCOUNTER — Ambulatory Visit (INDEPENDENT_AMBULATORY_CARE_PROVIDER_SITE_OTHER): Payer: Medicare Other

## 2021-08-05 DIAGNOSIS — L501 Idiopathic urticaria: Secondary | ICD-10-CM | POA: Diagnosis not present

## 2021-08-18 DIAGNOSIS — L501 Idiopathic urticaria: Secondary | ICD-10-CM | POA: Diagnosis not present

## 2021-08-19 ENCOUNTER — Other Ambulatory Visit: Payer: Self-pay

## 2021-08-19 ENCOUNTER — Ambulatory Visit (INDEPENDENT_AMBULATORY_CARE_PROVIDER_SITE_OTHER): Payer: Medicare Other

## 2021-08-19 DIAGNOSIS — L501 Idiopathic urticaria: Secondary | ICD-10-CM

## 2021-08-24 ENCOUNTER — Telehealth: Payer: Self-pay | Admitting: *Deleted

## 2021-08-24 ENCOUNTER — Other Ambulatory Visit: Payer: Self-pay | Admitting: Oncology

## 2021-08-24 DIAGNOSIS — C679 Malignant neoplasm of bladder, unspecified: Secondary | ICD-10-CM

## 2021-08-24 MED ORDER — HYDROMORPHONE HCL 4 MG PO TABS: 4.0000 mg | ORAL_TABLET | ORAL | 0 refills | Status: DC | PRN

## 2021-08-24 MED ORDER — OXYCODONE HCL 5 MG PO TABS: ORAL_TABLET | ORAL | 0 refills | Status: DC

## 2021-08-24 NOTE — Telephone Encounter (Signed)
Pt left voicemail on Saturday and Sunday for a refill of Dilaudid and Oxycodone

## 2021-09-01 DIAGNOSIS — L501 Idiopathic urticaria: Secondary | ICD-10-CM | POA: Diagnosis not present

## 2021-09-02 ENCOUNTER — Ambulatory Visit (INDEPENDENT_AMBULATORY_CARE_PROVIDER_SITE_OTHER): Payer: Medicare Other

## 2021-09-02 ENCOUNTER — Other Ambulatory Visit: Payer: Self-pay

## 2021-09-02 DIAGNOSIS — L501 Idiopathic urticaria: Secondary | ICD-10-CM | POA: Diagnosis not present

## 2021-09-10 ENCOUNTER — Other Ambulatory Visit: Payer: Self-pay

## 2021-09-10 ENCOUNTER — Inpatient Hospital Stay (HOSPITAL_BASED_OUTPATIENT_CLINIC_OR_DEPARTMENT_OTHER): Payer: Medicare Other | Admitting: Oncology

## 2021-09-10 ENCOUNTER — Inpatient Hospital Stay: Payer: Medicare Other | Attending: Oncology

## 2021-09-10 VITALS — BP 155/78 | HR 79 | Temp 97.2°F | Resp 20 | Wt 213.9 lb

## 2021-09-10 DIAGNOSIS — C679 Malignant neoplasm of bladder, unspecified: Secondary | ICD-10-CM | POA: Diagnosis not present

## 2021-09-10 DIAGNOSIS — C7951 Secondary malignant neoplasm of bone: Secondary | ICD-10-CM | POA: Insufficient documentation

## 2021-09-10 DIAGNOSIS — G620 Drug-induced polyneuropathy: Secondary | ICD-10-CM | POA: Insufficient documentation

## 2021-09-10 DIAGNOSIS — L308 Other specified dermatitis: Secondary | ICD-10-CM | POA: Insufficient documentation

## 2021-09-10 DIAGNOSIS — C7800 Secondary malignant neoplasm of unspecified lung: Secondary | ICD-10-CM | POA: Diagnosis not present

## 2021-09-10 LAB — CBC WITH DIFFERENTIAL (CANCER CENTER ONLY)
Abs Immature Granulocytes: 0.05 10*3/uL (ref 0.00–0.07)
Basophils Absolute: 0.1 10*3/uL (ref 0.0–0.1)
Basophils Relative: 1 %
Eosinophils Absolute: 0.2 10*3/uL (ref 0.0–0.5)
Eosinophils Relative: 2 %
HCT: 41.6 % (ref 39.0–52.0)
Hemoglobin: 13.8 g/dL (ref 13.0–17.0)
Immature Granulocytes: 1 %
Lymphocytes Relative: 8 %
Lymphs Abs: 0.7 10*3/uL (ref 0.7–4.0)
MCH: 34.8 pg — ABNORMAL HIGH (ref 26.0–34.0)
MCHC: 33.2 g/dL (ref 30.0–36.0)
MCV: 105.1 fL — ABNORMAL HIGH (ref 80.0–100.0)
Monocytes Absolute: 1 10*3/uL (ref 0.1–1.0)
Monocytes Relative: 11 %
Neutro Abs: 6.6 10*3/uL (ref 1.7–7.7)
Neutrophils Relative %: 77 %
Platelet Count: 206 10*3/uL (ref 150–400)
RBC: 3.96 MIL/uL — ABNORMAL LOW (ref 4.22–5.81)
RDW: 13 % (ref 11.5–15.5)
WBC Count: 8.6 10*3/uL (ref 4.0–10.5)
nRBC: 0 % (ref 0.0–0.2)

## 2021-09-10 LAB — CMP (CANCER CENTER ONLY)
ALT: 12 U/L (ref 0–44)
AST: 13 U/L — ABNORMAL LOW (ref 15–41)
Albumin: 3.4 g/dL — ABNORMAL LOW (ref 3.5–5.0)
Alkaline Phosphatase: 56 U/L (ref 38–126)
Anion gap: 6 (ref 5–15)
BUN: 13 mg/dL (ref 8–23)
CO2: 26 mmol/L (ref 22–32)
Calcium: 8.7 mg/dL — ABNORMAL LOW (ref 8.9–10.3)
Chloride: 106 mmol/L (ref 98–111)
Creatinine: 1.18 mg/dL (ref 0.61–1.24)
GFR, Estimated: >60 mL/min (ref >60)
Glucose, Bld: 99 mg/dL (ref 70–99)
Potassium: 4.3 mmol/L (ref 3.5–5.1)
Sodium: 138 mmol/L (ref 135–145)
Total Bilirubin: 0.4 mg/dL (ref 0.3–1.2)
Total Protein: 5.8 g/dL — ABNORMAL LOW (ref 6.5–8.1)

## 2021-09-10 NOTE — Progress Notes (Signed)
Hematology and Oncology Follow Up   STEPHANIE MCGLONE [id number] 1940-09-11 81 y.o. 09/10/2021 2:31 PM [name], MDFusco, Purcell Nails, MD       Principle Diagnosis: 81 year old man with bladder cancer diagnosed in 2018.  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  His PDL 1 positive  with CPS score over 90%.    Prior Therapy:    He is status post neoadjuvant chemotherapy utilizing gemcitabine and cisplatin started in September 2018.  He received day 1 of cycle 1 on 05/02/2017 and therapy discontinued after that.   He is status post laparoscopic Cystoprostatectomy and bilateral lymphadenectomy done by Dr. Tresa Moore on 06/29/2017. Final pathology showed a T3bN0 disease.  He has 13 lymph nodes sampled without any evidence of malignancy.   He completed radiation therapy to the pelvis on January 16, 2018 under the care of Dr. Tammi Klippel.     Carboplatin and gemcitabine he is status post 6 cycles of therapy completed on 05/22/2018.   Pembrolizumab 200 mg every 3 weeks started on 06/21/2018.  He completed 8 cycles of therapy and April 2020.  Therapy discontinued because of dermatological toxicities.   He is status post radiation therapy to the right paratracheal lymph node for a total of 50 Gray in 10 fractions completed on April 23, 2020.    Current therapy:  Active surveillance.   Interim History: Mr. Patmon returns today for a follow-up visit.  Since last visit, he reports no major changes in his health.  He denies any recent hospitalizations or illnesses.  He denies any worsening bone pain or pathological fractures.  He continues to have chronic hip pain which is manageable at this time.  He is able to eat better and gained weight.  He denies any worsening pruritus or skin rash.  Medications: Updated on review. Current Outpatient Medications  Medication Sig Dispense Refill   acetaminophen (TYLENOL) 325 MG tablet Take 2 tablets (650  mg total) by mouth every 6 (six) hours as needed for mild pain, fever or headache (or Fever >/= 101). 12 tablet 0   clotrimazole-betamethasone (LOTRISONE) cream Apply 1 application topically in the morning and at bedtime.     HYDROmorphone (DILAUDID) 4 MG tablet Take 1 tablet (4 mg total) by mouth every 4 (four) hours as needed for severe pain. 120 tablet 0   lactose free nutrition (BOOST) LIQD Take 237 mLs by mouth 3 (three) times daily between meals.      oxyCODONE (OXY IR/ROXICODONE) 5 MG immediate release tablet TAKE (1) TO (2) TABLETS BY MOUTH EVERY (4) HOURS AS NEEDED FOR SEVERE PAIN. 120 tablet 0   polyethylene glycol (MIRALAX / GLYCOLAX) 17 g packet Take 17 g by mouth daily as needed for mild constipation. 14 each 0   Current Facility-Administered Medications  Medication Dose Route Frequency Provider Last Rate Last Admin   omalizumab Arvid Right) prefilled syringe 300 mg  300 mg Subcutaneous Q14 Days Valentina Shaggy, MD   300 mg at 09/02/21 7412     Allergies:  Allergies  Allergen Reactions   Ciprofloxacin Hives   Cefepime Rash   Zofran [Ondansetron] Rash   Physical exam:   Blood pressure (!) 155/78, pulse 79, temperature (!) 97.2 F (36.2 C), resp. rate 20, weight 213 lb 14.4 oz (97 kg), SpO2 95 %.    ECOG 1   General appearance: Alert, awake without any distress. Head: Atraumatic without abnormalities Oropharynx: Without any thrush or ulcers. Eyes: No scleral icterus.  Lymph nodes: No lymphadenopathy noted in the cervical, supraclavicular, or axillary nodes Heart:regular rate and rhythm, without any murmurs or gallops.   Lung: Clear to auscultation without any rhonchi, wheezes or dullness to percussion. Abdomin: Soft, nontender without any shifting dullness or ascites. Musculoskeletal: No clubbing or cyanosis. Neurological: No motor or sensory deficits. Skin: No rashes or lesions.         Lab Results: Lab Results  Component Value Date   WBC 8.6  09/10/2021   HGB 13.8 09/10/2021   HCT 41.6 09/10/2021   MCV 105.1 (H) 09/10/2021   PLT 206 09/10/2021     Chemistry      Component Value Date/Time   NA 141 06/03/2021 0807   NA 140 04/30/2019 1033   NA 141 05/23/2017 0828   K 4.2 06/03/2021 0807   K 4.0 05/23/2017 0828   CL 107 06/03/2021 0807   CO2 22 06/03/2021 0807   CO2 27 05/23/2017 0828   BUN 15 06/03/2021 0807   BUN 16 04/30/2019 1033   BUN 12.5 05/23/2017 0828   CREATININE 1.01 06/03/2021 0807   CREATININE 0.8 05/23/2017 0828      Component Value Date/Time   CALCIUM 9.6 06/03/2021 0807   CALCIUM 9.0 05/23/2017 0828   ALKPHOS 66 06/03/2021 0807   ALKPHOS 57 05/23/2017 0828   AST 15 06/03/2021 0807   AST 13 05/23/2017 0828   ALT 15 06/03/2021 0807   ALT 15 05/23/2017 0828   BILITOT 0.5 06/03/2021 0807   BILITOT 0.37 05/23/2017 0828     IMPRESSION: Status post radical cystoprostatectomy and ileal conduit formation. Stable lytic lesion within the right ischium. No additional foci of metastatic disease within the chest, abdomen, and pelvis.   Stable masslike consolidation within the right apex in keeping with post radiation change when compared to remote prior examination of 10/14/2020, stable.   Stable spiculated left upper lobe pulmonary 8 mm nodule. This, however, has decreased in size since remote prior examination of 03/05/2020 suggesting the residua of an infectious or inflammatory process.   Mild emphysema. Bronchial wall thickening in keeping with airway inflammation, asymmetrically more severe within the right upper lobe, possibly defect of radiation therapy.   Stable pathologically enlarged subcarinal lymph node, nonspecific. Close attention on subsequent examination is warranted.   Extensive multi-vessel coronary artery calcification.   Aortic Atherosclerosis (ICD10-I70.0) and Emphysema (ICD10-J43.9).       Impression and Plan:  81 year old man with:    1.   Stage IV high-grade  urothelial carcinoma of the bladder confirmed in 2019.   CT scan obtained on July 03, 2021 was personally reviewed and showed no evidence of metastatic disease at this time.  Management options moving forward and different salvage therapy treatment were reviewed.  These options include retreatment with immunotherapy, antibody drug conjugate with Padcev versus traditional chemotherapy.  At this time, I recommended continued active surveillance and we will update his staging scan before the next visit.    2. Hip pain: Related to advanced malignancy with pain is manageable.  He is currently on oxycodone and Dilaudid.   3.  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   4.  Follow-up: In 3 months for repeat evaluation and repeat imaging studies.   30  minutes were spent on this visit.  The time was dedicated to reviewing laboratory data, disease status update, treatment choices for the future and addressing complications related to previous therapy.   Roxy Cedar  Alen Blew, MD 09/10/2021 2:31 PM

## 2021-09-15 DIAGNOSIS — L501 Idiopathic urticaria: Secondary | ICD-10-CM | POA: Diagnosis not present

## 2021-09-16 ENCOUNTER — Other Ambulatory Visit: Payer: Self-pay

## 2021-09-16 ENCOUNTER — Ambulatory Visit (INDEPENDENT_AMBULATORY_CARE_PROVIDER_SITE_OTHER): Payer: Medicare Other

## 2021-09-16 DIAGNOSIS — L501 Idiopathic urticaria: Secondary | ICD-10-CM | POA: Diagnosis not present

## 2021-09-29 DIAGNOSIS — L501 Idiopathic urticaria: Secondary | ICD-10-CM | POA: Diagnosis not present

## 2021-09-30 ENCOUNTER — Ambulatory Visit (INDEPENDENT_AMBULATORY_CARE_PROVIDER_SITE_OTHER): Payer: Medicare Other | Admitting: *Deleted

## 2021-09-30 ENCOUNTER — Ambulatory Visit (HOSPITAL_COMMUNITY)
Admission: RE | Admit: 2021-09-30 | Discharge: 2021-09-30 | Disposition: A | Discharge status: Home / Self Care | Payer: Medicare Other | Source: Ambulatory Visit | Attending: Internal Medicine | Admitting: Internal Medicine

## 2021-09-30 ENCOUNTER — Other Ambulatory Visit (HOSPITAL_COMMUNITY): Payer: Self-pay | Admitting: Internal Medicine

## 2021-09-30 ENCOUNTER — Other Ambulatory Visit: Payer: Self-pay

## 2021-09-30 DIAGNOSIS — E6609 Other obesity due to excess calories: Secondary | ICD-10-CM | POA: Diagnosis not present

## 2021-09-30 DIAGNOSIS — R059 Cough, unspecified: Secondary | ICD-10-CM | POA: Insufficient documentation

## 2021-09-30 DIAGNOSIS — R918 Other nonspecific abnormal finding of lung field: Secondary | ICD-10-CM | POA: Diagnosis not present

## 2021-09-30 DIAGNOSIS — L501 Idiopathic urticaria: Secondary | ICD-10-CM | POA: Diagnosis not present

## 2021-09-30 DIAGNOSIS — R5383 Other fatigue: Secondary | ICD-10-CM | POA: Diagnosis not present

## 2021-09-30 DIAGNOSIS — I10 Essential (primary) hypertension: Secondary | ICD-10-CM | POA: Diagnosis not present

## 2021-09-30 DIAGNOSIS — E039 Hypothyroidism, unspecified: Secondary | ICD-10-CM | POA: Diagnosis not present

## 2021-09-30 DIAGNOSIS — J449 Chronic obstructive pulmonary disease, unspecified: Secondary | ICD-10-CM | POA: Diagnosis not present

## 2021-09-30 DIAGNOSIS — Z6830 Body mass index (BMI) 30.0-30.9, adult: Secondary | ICD-10-CM | POA: Diagnosis not present

## 2021-10-01 ENCOUNTER — Telehealth: Payer: Self-pay | Admitting: Oncology

## 2021-10-01 ENCOUNTER — Telehealth: Payer: Self-pay

## 2021-10-01 NOTE — Telephone Encounter (Signed)
Sch per 2/16 inbasket, pt aware

## 2021-10-01 NOTE — Telephone Encounter (Signed)
Patient called to report that he has new lump under his right arm the size of a "large marble" which was discovered "about a month ago". He saw PCP on 10/01/21 who instructed that pt f/u with Dr. Alen Blew. Scheduling message sent.

## 2021-10-07 ENCOUNTER — Inpatient Hospital Stay: Payer: Medicare Other | Attending: Oncology

## 2021-10-07 ENCOUNTER — Inpatient Hospital Stay (HOSPITAL_BASED_OUTPATIENT_CLINIC_OR_DEPARTMENT_OTHER): Payer: Medicare Other | Admitting: Oncology

## 2021-10-07 ENCOUNTER — Other Ambulatory Visit: Payer: Self-pay

## 2021-10-07 VITALS — BP 135/68 | HR 88 | Temp 97.8°F | Resp 19 | Ht 71.0 in | Wt 214.4 lb

## 2021-10-07 DIAGNOSIS — Z923 Personal history of irradiation: Secondary | ICD-10-CM | POA: Diagnosis not present

## 2021-10-07 DIAGNOSIS — C7951 Secondary malignant neoplasm of bone: Secondary | ICD-10-CM | POA: Insufficient documentation

## 2021-10-07 DIAGNOSIS — Z9221 Personal history of antineoplastic chemotherapy: Secondary | ICD-10-CM | POA: Diagnosis not present

## 2021-10-07 DIAGNOSIS — C679 Malignant neoplasm of bladder, unspecified: Secondary | ICD-10-CM

## 2021-10-07 DIAGNOSIS — R599 Enlarged lymph nodes, unspecified: Secondary | ICD-10-CM | POA: Diagnosis not present

## 2021-10-07 DIAGNOSIS — C7800 Secondary malignant neoplasm of unspecified lung: Secondary | ICD-10-CM | POA: Insufficient documentation

## 2021-10-07 LAB — CMP (CANCER CENTER ONLY)
ALT: 9 U/L (ref 0–44)
AST: 9 U/L — ABNORMAL LOW (ref 15–41)
Albumin: 3.5 g/dL (ref 3.5–5.0)
Alkaline Phosphatase: 56 U/L (ref 38–126)
Anion gap: 7 (ref 5–15)
BUN: 16 mg/dL (ref 8–23)
CO2: 28 mmol/L (ref 22–32)
Calcium: 9 mg/dL (ref 8.9–10.3)
Chloride: 107 mmol/L (ref 98–111)
Creatinine: 1.13 mg/dL (ref 0.61–1.24)
GFR, Estimated: >60 mL/min (ref >60)
Glucose, Bld: 156 mg/dL — ABNORMAL HIGH (ref 70–99)
Potassium: 4.5 mmol/L (ref 3.5–5.1)
Sodium: 142 mmol/L (ref 135–145)
Total Bilirubin: 0.5 mg/dL (ref 0.3–1.2)
Total Protein: 6 g/dL — ABNORMAL LOW (ref 6.5–8.1)

## 2021-10-07 LAB — CBC WITH DIFFERENTIAL (CANCER CENTER ONLY)
Abs Immature Granulocytes: 0.05 10*3/uL (ref 0.00–0.07)
Basophils Absolute: 0 10*3/uL (ref 0.0–0.1)
Basophils Relative: 0 %
Eosinophils Absolute: 0 10*3/uL (ref 0.0–0.5)
Eosinophils Relative: 0 %
HCT: 42.7 % (ref 39.0–52.0)
Hemoglobin: 14 g/dL (ref 13.0–17.0)
Immature Granulocytes: 1 %
Lymphocytes Relative: 6 %
Lymphs Abs: 0.5 10*3/uL — ABNORMAL LOW (ref 0.7–4.0)
MCH: 34.9 pg — ABNORMAL HIGH (ref 26.0–34.0)
MCHC: 32.8 g/dL (ref 30.0–36.0)
MCV: 106.5 fL — ABNORMAL HIGH (ref 80.0–100.0)
Monocytes Absolute: 0.6 10*3/uL (ref 0.1–1.0)
Monocytes Relative: 6 %
Neutro Abs: 8.1 10*3/uL — ABNORMAL HIGH (ref 1.7–7.7)
Neutrophils Relative %: 87 %
Platelet Count: 212 10*3/uL (ref 150–400)
RBC: 4.01 MIL/uL — ABNORMAL LOW (ref 4.22–5.81)
RDW: 12.8 % (ref 11.5–15.5)
WBC Count: 9.3 10*3/uL (ref 4.0–10.5)
nRBC: 0 % (ref 0.0–0.2)

## 2021-10-07 NOTE — Progress Notes (Signed)
Hematology and Oncology Follow Up   [name] [id number] [birth year] 81 y.o. 10/07/2021 11:33 AM [name], MDFusco, Purcell Nails, MD       Principle Diagnosis: 81 year old man with stage IV high-grade urothelial carcinoma of the bladder diagnosed in 2018 with pulmonary and bone involvement.  This tumor found to have PDL 1 positive  with CPS score over 90%.    Prior Therapy:    He is status post neoadjuvant chemotherapy utilizing gemcitabine and cisplatin started in September 2018.  He received day 1 of cycle 1 on 05/02/2017 and therapy discontinued after that.   He is status post laparoscopic Cystoprostatectomy and bilateral lymphadenectomy done by Dr. Tresa Moore on 06/29/2017. Final pathology showed a T3bN0 disease.  He has 13 lymph nodes sampled without any evidence of malignancy.   He completed radiation therapy to the pelvis on January 16, 2018 under the care of Dr. Tammi Klippel.     Carboplatin and gemcitabine he is status post 6 cycles of therapy completed on 05/22/2018.   Pembrolizumab 200 mg every 3 weeks started on 06/21/2018.  He completed 8 cycles of therapy and April 2020.  Therapy discontinued because of dermatological toxicities.   He is status post radiation therapy to the right paratracheal lymph node for a total of 50 Gray in 10 fractions completed on April 23, 2020.    Current therapy:  Active surveillance.   Interim History: [name] presents today for a follow-up visit.  Since the last visit, he noted a side.  He denies any constitutional symptoms.  He denied any fevers, chills or sweats.  He denies any worsening bone pain or pathological fractures.  He denies any hospitalizations or illnesses.   Medications: Reviewed without changes. Current Outpatient Medications  Medication Sig Dispense Refill   acetaminophen (TYLENOL) 325 MG tablet Take 2 tablets (650 mg total) by mouth every 6 (six) hours as needed for mild pain, fever or headache (or Fever >/= 101). 12 tablet 0    clotrimazole-betamethasone (LOTRISONE) cream Apply 1 application topically in the morning and at bedtime.     HYDROmorphone (DILAUDID) 4 MG tablet Take 1 tablet (4 mg total) by mouth every 4 (four) hours as needed for severe pain. 120 tablet 0   lactose free nutrition (BOOST) LIQD Take 237 mLs by mouth 3 (three) times daily between meals.      oxyCODONE (OXY IR/ROXICODONE) 5 MG immediate release tablet TAKE (1) TO (2) TABLETS BY MOUTH EVERY (4) HOURS AS NEEDED FOR SEVERE PAIN. 120 tablet 0   polyethylene glycol (MIRALAX / GLYCOLAX) 17 g packet Take 17 g by mouth daily as needed for mild constipation. 14 each 0   Current Facility-Administered Medications  Medication Dose Route Frequency Provider Last Rate Last Admin   omalizumab Arvid Right) prefilled syringe 300 mg  300 mg Subcutaneous Q14 Days Valentina Shaggy, MD   300 mg at 09/30/21 5003     Allergies:  Allergies  Allergen Reactions   Ciprofloxacin Hives   Cefepime Rash   Zofran [Ondansetron] Rash   Physical exam:   Blood pressure 135/68, pulse 88, temperature 97.8 F (36.6 C), temperature source Temporal, resp. rate 19, height 5\' 11"  (1.803 m), weight 214 lb 6.4 oz (97.3 kg), SpO2 98 %.    ECOG 1     General appearance: Comfortable appearing without any discomfort Head: Normocephalic without any trauma Oropharynx: Mucous membranes are moist and pink without any thrush or ulcers. Eyes: Pupils are equal and round reactive to light. Lymph nodes: Left  soft axillary adenopathy palpated with a soft tissue mass noted as well. Heart:regular rate and rhythm.  S1 and S2 without leg edema. Lung: Clear without any rhonchi or wheezes.  No dullness to percussion. Abdomin: Soft, nontender, nondistended with good bowel sounds.  No hepatosplenomegaly. Musculoskeletal: No joint deformity or effusion.  Full range of motion noted. Neurological: No deficits noted on motor, sensory and deep tendon reflex exam. Skin: No petechial rash or  dryness.  Appeared moist.            Lab Results: Lab Results  Component Value Date   WBC 9.3 10/07/2021   HGB 14.0 10/07/2021   HCT 42.7 10/07/2021   MCV 106.5 (H) 10/07/2021   PLT 212 10/07/2021     Chemistry      Component Value Date/Time   NA 138 09/10/2021 1422   NA 140 04/30/2019 1033   NA 141 05/23/2017 0828   K 4.3 09/10/2021 1422   K 4.0 05/23/2017 0828   CL 106 09/10/2021 1422   CO2 26 09/10/2021 1422   CO2 27 05/23/2017 0828   BUN 13 09/10/2021 1422   BUN 16 04/30/2019 1033   BUN 12.5 05/23/2017 0828   CREATININE 1.18 09/10/2021 1422   CREATININE 0.8 05/23/2017 0828      Component Value Date/Time   CALCIUM 8.7 (L) 09/10/2021 1422   CALCIUM 9.0 05/23/2017 0828   ALKPHOS 56 09/10/2021 1422   ALKPHOS 57 05/23/2017 0828   AST 13 (L) 09/10/2021 1422   AST 13 05/23/2017 0828   ALT 12 09/10/2021 1422   ALT 15 05/23/2017 0828   BILITOT 0.4 09/10/2021 1422   BILITOT 0.37 05/23/2017 0828            Impression and Plan:  81 year old man with:    1.   Bladder cancer diagnosed in 2018.  He developed stage IV high-grade urothelial carcinoma with pulmonary and bone disease in 2019.   He is currently on active surveillance and will require updating his staging scan.  This was tentatively scheduled in May and will be moved to sooner due to his recent finding of left axillary nodules.  I will obtain PET scan for better evaluation at this time.    2. Hip pain: Currently on oxycodone and Dilaudid with pain is manageable.   3.  Left axillary nodules: This could represent lymphadenopathy and possible soft tissue tumors.  We will obtain pet imaging in the near future for better evaluation.   4.  Follow-up: He will return in May 2023 sooner if his imaging studies suggest malignancy.   30  minutes were spent on this encounter.  The time was dedicated to reviewing laboratory data, disease status update and outlining future plan of care.   Zola Button, MD  10/07/2021 11:33 AM

## 2021-10-08 ENCOUNTER — Telehealth: Payer: Self-pay | Admitting: Oncology

## 2021-10-08 NOTE — Telephone Encounter (Signed)
Scheduled appt per 2/22 los. Pt's wife is aware.

## 2021-10-13 ENCOUNTER — Other Ambulatory Visit: Payer: Self-pay | Admitting: Oncology

## 2021-10-13 DIAGNOSIS — C679 Malignant neoplasm of bladder, unspecified: Secondary | ICD-10-CM

## 2021-10-13 DIAGNOSIS — L501 Idiopathic urticaria: Secondary | ICD-10-CM | POA: Diagnosis not present

## 2021-10-14 ENCOUNTER — Ambulatory Visit (INDEPENDENT_AMBULATORY_CARE_PROVIDER_SITE_OTHER): Payer: Medicare Other

## 2021-10-14 ENCOUNTER — Other Ambulatory Visit: Payer: Self-pay

## 2021-10-14 DIAGNOSIS — L501 Idiopathic urticaria: Secondary | ICD-10-CM | POA: Diagnosis not present

## 2021-10-20 ENCOUNTER — Encounter: Payer: Self-pay | Admitting: Oncology

## 2021-10-22 ENCOUNTER — Other Ambulatory Visit: Payer: Self-pay

## 2021-10-22 ENCOUNTER — Ambulatory Visit (HOSPITAL_COMMUNITY)
Admission: RE | Admit: 2021-10-22 | Discharge: 2021-10-22 | Disposition: A | Discharge status: Home / Self Care | Payer: Medicare Other | Source: Ambulatory Visit | Attending: Oncology | Admitting: Oncology

## 2021-10-22 DIAGNOSIS — Z9221 Personal history of antineoplastic chemotherapy: Secondary | ICD-10-CM | POA: Diagnosis not present

## 2021-10-22 DIAGNOSIS — I2510 Atherosclerotic heart disease of native coronary artery without angina pectoris: Secondary | ICD-10-CM | POA: Insufficient documentation

## 2021-10-22 DIAGNOSIS — Z923 Personal history of irradiation: Secondary | ICD-10-CM | POA: Insufficient documentation

## 2021-10-22 DIAGNOSIS — R590 Localized enlarged lymph nodes: Secondary | ICD-10-CM | POA: Diagnosis not present

## 2021-10-22 DIAGNOSIS — J90 Pleural effusion, not elsewhere classified: Secondary | ICD-10-CM | POA: Insufficient documentation

## 2021-10-22 DIAGNOSIS — R911 Solitary pulmonary nodule: Secondary | ICD-10-CM | POA: Insufficient documentation

## 2021-10-22 DIAGNOSIS — N62 Hypertrophy of breast: Secondary | ICD-10-CM | POA: Diagnosis not present

## 2021-10-22 DIAGNOSIS — C679 Malignant neoplasm of bladder, unspecified: Secondary | ICD-10-CM | POA: Diagnosis not present

## 2021-10-22 DIAGNOSIS — I700 Atherosclerosis of aorta: Secondary | ICD-10-CM | POA: Diagnosis not present

## 2021-10-22 LAB — GLUCOSE, CAPILLARY: Glucose-Capillary: 99 mg/dL (ref 70–99)

## 2021-10-22 MED ORDER — FLUDEOXYGLUCOSE F - 18 (FDG) INJECTION
10.6000 | Freq: Once | INTRAVENOUS | Status: AC
  Administered 2021-10-22: 11:00:00 10.6 via INTRAVENOUS

## 2021-10-23 ENCOUNTER — Other Ambulatory Visit: Payer: Self-pay | Admitting: Oncology

## 2021-10-23 DIAGNOSIS — C679 Malignant neoplasm of bladder, unspecified: Secondary | ICD-10-CM

## 2021-10-23 NOTE — Progress Notes (Signed)
The results of the PET scan were personally reviewed and discussed with the patient today.  He does have an enlarged AP lymph node with increased SUV uptake.  There is no widespread metastatic disease noted. ? ?Treatment options at this time were discussed including restarting systemic therapy with immunotherapy which she has responded to in the past, systemic chemotherapy or consider treatment locally for the isolated area of metastasis via radiation.  After discussion today with the patient today he is interested in repeating local therapy given the side effects associated with previous immunotherapy as well as the limited metastatic disease. ? ?I will make the appropriate referral to radiation oncology for evaluation and will consider systemic therapy if radiation is not an option. ?

## 2021-10-27 DIAGNOSIS — L501 Idiopathic urticaria: Secondary | ICD-10-CM | POA: Diagnosis not present

## 2021-10-28 ENCOUNTER — Ambulatory Visit (INDEPENDENT_AMBULATORY_CARE_PROVIDER_SITE_OTHER): Payer: Medicare Other | Admitting: *Deleted

## 2021-10-28 ENCOUNTER — Other Ambulatory Visit: Payer: Self-pay

## 2021-10-28 DIAGNOSIS — L501 Idiopathic urticaria: Secondary | ICD-10-CM | POA: Diagnosis not present

## 2021-10-29 ENCOUNTER — Telehealth: Payer: Self-pay

## 2021-10-29 NOTE — Telephone Encounter (Signed)
Verified patient identity and reminded him of his 9:30am-10/30/21 in-person appointment w/ Ashlyn Bruning PA-C. I advised patient to arrive 93mn early for check-in. I left my extension [phone] case patient needs anything. Patient verbalized understanding of information. ?

## 2021-10-30 ENCOUNTER — Other Ambulatory Visit: Payer: Self-pay

## 2021-10-30 ENCOUNTER — Encounter: Payer: Self-pay | Admitting: Urology

## 2021-10-30 ENCOUNTER — Ambulatory Visit
Admission: RE | Admit: 2021-10-30 | Discharge: 2021-10-30 | Disposition: A | Discharge status: Home / Self Care | Payer: Medicare Other | Source: Ambulatory Visit | Attending: Radiation Oncology | Admitting: Radiation Oncology

## 2021-10-30 ENCOUNTER — Ambulatory Visit
Admission: RE | Admit: 2021-10-30 | Discharge: 2021-10-30 | Disposition: A | Discharge status: Home / Self Care | Payer: Medicare Other | Source: Ambulatory Visit | Attending: Urology | Admitting: Urology

## 2021-10-30 VITALS — BP 137/77 | HR 81 | Temp 96.8°F | Resp 18 | Ht 71.0 in | Wt 210.6 lb

## 2021-10-30 DIAGNOSIS — R918 Other nonspecific abnormal finding of lung field: Secondary | ICD-10-CM | POA: Diagnosis not present

## 2021-10-30 DIAGNOSIS — C7801 Secondary malignant neoplasm of right lung: Secondary | ICD-10-CM

## 2021-10-30 DIAGNOSIS — C7951 Secondary malignant neoplasm of bone: Secondary | ICD-10-CM | POA: Insufficient documentation

## 2021-10-30 DIAGNOSIS — C7800 Secondary malignant neoplasm of unspecified lung: Secondary | ICD-10-CM

## 2021-10-30 DIAGNOSIS — Z87891 Personal history of nicotine dependence: Secondary | ICD-10-CM | POA: Insufficient documentation

## 2021-10-30 DIAGNOSIS — C771 Secondary and unspecified malignant neoplasm of intrathoracic lymph nodes: Secondary | ICD-10-CM | POA: Insufficient documentation

## 2021-10-30 DIAGNOSIS — I2510 Atherosclerotic heart disease of native coronary artery without angina pectoris: Secondary | ICD-10-CM | POA: Diagnosis not present

## 2021-10-30 DIAGNOSIS — C679 Malignant neoplasm of bladder, unspecified: Secondary | ICD-10-CM | POA: Diagnosis not present

## 2021-10-30 DIAGNOSIS — K219 Gastro-esophageal reflux disease without esophagitis: Secondary | ICD-10-CM | POA: Insufficient documentation

## 2021-10-30 DIAGNOSIS — Z86010 Personal history of colonic polyps: Secondary | ICD-10-CM | POA: Diagnosis not present

## 2021-10-30 DIAGNOSIS — Z79899 Other long term (current) drug therapy: Secondary | ICD-10-CM | POA: Insufficient documentation

## 2021-10-30 DIAGNOSIS — N62 Hypertrophy of breast: Secondary | ICD-10-CM | POA: Insufficient documentation

## 2021-10-30 NOTE — Progress Notes (Signed)
Patient here for "Reconsult" appointment w/ Ashlyn Bruning PA-C. I verified patient identity and began nursing interview w/ spouse Mrs. Paek Kage in attendance. Patient reports productive cough (clear/ white mucus). No other symptoms reported to me at this time. ? ?Meaningful use complete. ? ?BP 137/77 (BP Location: Left Arm, Patient Position: Sitting, Cuff Size: Normal)   Pulse 81   Temp (!) 96.8 ?F (36 ?C) (Temporal)   Resp 18   Ht '5\' 11"'$  (1.803 m)   Wt 210 lb 9.6 oz (95.5 kg)   SpO2 96%   BMI 29.37 kg/m?  ? ? ?

## 2021-10-30 NOTE — Progress Notes (Signed)
?Radiation Oncology         [phone] ?________________________________ ? ?Outpatient Re-Consultation ? ?Name: [name] MRN: [id number]  ?Date of Service: 10/30/2021 DOB: 1940/10/11 ? ?XB:MWUXL, Purcell Nails, MD  [name], MD  ? ?REFERRING PHYSICIAN: [name], MD ? ?DIAGNOSIS: 81 y.o. gentleman with Stage IV  (pT3b, pN0) high-grade urothelial carcinoma of the bladder with disease progression in the intrathoracic lymph nodes. ? ?  ICD-10-CM   ?1. Metastatic cancer to intrathoracic lymph nodes (HCC)  C77.1   ?  ? ? ?HISTORY OF PRESENT ILLNESS: [name] is a 81 y.o. male seen at the request of Dr. Alen Blew for progressive metastatic disease in the chest.  He has a history of pT3bN0 high grade urothelial carcinoma of the bladder diagnosed initially in August 2018.  He was started on neoadjuvant chemotherapy with cisplatin and gemcitabine but discontinued after 1 cycle due to intolerability.  He proceeded with radical cystoprostatectomy with bilateral pelvic lymph node dissection with Dr. Tresa Moore on 06/29/2017.  Final pathology revealed a pT3bN0 high-grade urothelial carcinoma.  A CT A/P in April 2019 showed disease progression with bony metastasis and subcentimeter pulmonary nodules.  A CT-guided biopsy of the right ischial lesion was performed and confirmed metastatic bladder cancer.  We previously saw the patient in 12/2017 and treated him with palliative radiation to a site of painful metastasis in the right ischial bone. Following completion of the radiation, he received 6 cycles of carboplatin and gemcitabine, completed 05/22/2018  followed by 8 cycles of single agent pembrolizumab from 06/21/2018 through 11/2018, which was discontinued because of dermatological toxicities under the care of Dr. Alen Blew. He has been under observation since that time. ? ?A restaging CT C/A/P on 09/27/2019 revealed interval development of a 7 mm right upper lobe subpleural nodule and interval thickening of a precarinal  lymph node. Follow up CT C/A/P performed on 03/05/2020 showed enlargement of the right upper lobe pulmonary nodule, now measuring 1.7 cm and a new enlarged right paratracheal lymph node measuring 1.9 cm.  There was a stable appearance of the right inferior pubic ramus lytic lesion and no other definitive findings of metastatic disease elsewhere. We met with him on 03/28/21 to discuss radiotherapy for management of the oligometastatic disease which he was in agreement with and subsequently completed a 10 fraction course of SBRT-like radiation on 04/23/20. He tolerated treatment well and continued in routine follow up on active surveillance under the care of Dr. Alen Blew. Follow up CT scans showed an excellent response to treatment without any signs of disease progression or recurrence. ? ?At the time of his recent follow up visit with Dr. Alen Blew on 10/07/21, he was noted to have a palpable left axillary lymph node and soft tissue mass in the left axilla which prompted a PET scan for further evaluation. The PET scan was completed 10/22/21 and confirmed disease progression with hypermetabolic subcarinal, AP window and bilateral hilar lymphadenopathy.  The previously treated pubic lesion and right upper lobe nodules appear stable and there was no concerning activity in the left axilla. ? ?He has reviewed the PET scan results with Dr. Alen Blew and has been kindly referred back to [place] for consideration of palliative radiation therapy to the involved intrathoracic lymph nodes in an attempt to delay the need for any further systemic therapy since he has not tolerated this very well in the past. ? ? ?PREVIOUS RADIATION THERAPY: Yes  ?04/09/20 - 04/23/20:  The RUL and right paratracheal node targets were  treated to 50 Gy in 10 fractions of 5 Gy each ? ?01/02/18 - 01/16/18: Right ischial lesion was treated to 40 Gy with 10 fx of 4 Gy ? ?PAST MEDICAL HISTORY:  ?Past Medical History:  ?Diagnosis Date  ? Bladder cancer ([place])   ? BPH (benign  prostatic hypertrophy)   ? Dysuria   ? GERD (gastroesophageal reflux disease)   ? History of adenomatous polyp of colon   ? tubular adenoma 06/ 2018  ? History of bladder cancer 12/2014  ? urologist-  dr [name]--  dx High Grade TCC without stromal invasion s/p TURBT and intravesical BCG tx/  recurrent 07/2015 (pTa) TCC  repear intravesical BCG tx   ? History of hiatal hernia   ? History of urethral stricture   ? Nocturia   ?   ? ?PAST SURGICAL HISTORY: ?Past Surgical History:  ?Procedure Laterality Date  ? CARDIAC CATHETERIZATION  1997  ? normal coronary arteries  ? CARDIOVASCULAR STRESS TEST  12-14-2005  ? Low risk perfusion study/  very small scar in the inferior septum from mid ventricle to apex,  no ischemia/  mild inferior septal hypokinesis/  ef 58%  ? CATARACT EXTRACTION W/ INTRAOCULAR LENS  IMPLANT, BILATERAL  2011  ? COLONOSCOPY  last one 06/ 2018  ? CYSTOSCOPY WITH BIOPSY N/A 08/12/2015  ? Procedure: CYSTOSCOPY WITH BIOPSY AND FULGERATION;  Surgeon: [name], MD;  Location: [facility];  Service: Urology;  Laterality: N/A;  ? CYSTOSCOPY WITH INJECTION N/A 06/29/2017  ? Procedure: CYSTOSCOPY WITH INJECTION OF INDOCYANINE GREEN DYE;  Surgeon: Alexis Frock, MD;  Location: [facility];  Service: Urology;  Laterality: N/A;  ? CYSTOSCOPY WITH URETHRAL DILATATION N/A 03/21/2017  ? Procedure: CYSTOSCOPY;  Surgeon: [name], MD;  [facility];  Service: Urology;  Laterality: N/A;  ? ESOPHAGOGASTRODUODENOSCOPY  12-05-2014  ? IR IMAGING GUIDED PORT INSERTION  02/07/2018  ? IR REMOVAL TUN ACCESS W/ PORT W/O FL MOD SED  04/14/2018  ? TRANSTHORACIC ECHOCARDIOGRAM  01-31-2008  ? nomral LVF,  ef 55-65%/  mild pulmonic stenosis without regurg.,  peak transpulomonic valve gradient 53mHg  ? TRANSURETHRAL RESECTION OF BLADDER TUMOR N/A 12/31/2014  ? Procedure: TRANSURETHRAL RESECTION OF BLADDER TUMOR (TURBT);  Surgeon: MLowella Bandy MD;  [facility]   Service: Urology;  Laterality: N/A;  ? TRANSURETHRAL RESECTION OF BLADDER TUMOR N/A 03/21/2017  ? Procedure: POSSIBLE TRANSURETHRAL RESECTION OF BLADDER TUMOR (TURBT);  Surgeon: BNickie Retort MD;  [facility]  Service: Urology;  Laterality: N/A;  ? ? ?FAMILY HISTORY:  ?Family History  ?Problem Relation Age of Onset  ? Alcoholism Father   ? Colon cancer Neg Hx   ? Colon polyps Neg Hx   ? Diabetes Neg Hx   ? Kidney disease Neg Hx   ? Esophageal cancer Neg Hx   ? Heart disease Neg Hx   ? Gallbladder disease Neg Hx   ? Allergic rhinitis Neg Hx   ? Angioedema Neg Hx   ? Asthma Neg Hx   ? Atopy Neg Hx   ? Eczema Neg Hx   ? Immunodeficiency Neg Hx   ? Urticaria Neg Hx   ? ? ?SOCIAL HISTORY:  ?Social History  ? ?Socioeconomic History  ? Marital status: Married  ?  Spouse name: Not on file  ? Number of children: 1  ? Years of education: Not on file  ? Highest education level: Not on file  ?Occupational History  ?  Occupation: Retired  ?Tobacco Use  ? Smoking status: Former  ?  Packs/day: 1.00  ?  Years: 34.00  ?  Pack years: 34.00  ?  Types: Cigarettes  ?  Quit date: 12/26/1988  ?  Years since quitting: 32.8  ? Smokeless tobacco: Never  ?Vaping Use  ? Vaping Use: Never used  ?Substance and Sexual Activity  ? Alcohol use: Yes  ?  Alcohol/week: 6.0 standard drinks  ?  Types: 6 Cans of beer per week  ?  Comment: BEER  ? Drug use: No  ? Sexual activity: Never  ?Other Topics Concern  ? Not on file  ?Social History Narrative  ? Not on file  ? ?Social Determinants of Health  ? ?Financial Resource Strain: Not on file  ?Food Insecurity: Not on file  ?Transportation Needs: Not on file  ?Physical Activity: Not on file  ?Stress: Not on file  ?Social Connections: Not on file  ?Intimate Partner Violence: Not on file  ? ? ?ALLERGIES: Ciprofloxacin, Cefepime, and Zofran [ondansetron] ? ?MEDICATIONS:  ?Current Outpatient Medications  ?Medication Sig Dispense Refill  ? acetaminophen (TYLENOL) 325 MG tablet Take 2  tablets (650 mg total) by mouth every 6 (six) hours as needed for mild pain, fever or headache (or Fever >/= 101). 12 tablet 0  ? clotrimazole-betamethasone (LOTRISONE) cream Apply 1 application topically

## 2021-11-04 ENCOUNTER — Other Ambulatory Visit: Payer: Self-pay

## 2021-11-04 ENCOUNTER — Ambulatory Visit
Admission: RE | Admit: 2021-11-04 | Discharge: 2021-11-04 | Disposition: A | Discharge status: Home / Self Care | Payer: Medicare Other | Source: Ambulatory Visit | Attending: Radiation Oncology | Admitting: Radiation Oncology

## 2021-11-04 DIAGNOSIS — C771 Secondary and unspecified malignant neoplasm of intrathoracic lymph nodes: Secondary | ICD-10-CM | POA: Insufficient documentation

## 2021-11-04 DIAGNOSIS — C679 Malignant neoplasm of bladder, unspecified: Secondary | ICD-10-CM | POA: Insufficient documentation

## 2021-11-04 DIAGNOSIS — C7801 Secondary malignant neoplasm of right lung: Secondary | ICD-10-CM | POA: Diagnosis not present

## 2021-11-04 NOTE — Progress Notes (Signed)
?  Radiation Oncology         [phone] ?________________________________ ? ?Name: [name] MRN: [id number]  ?Date: 11/04/2021  DOB: Dec 18, 1940 ? ?ULTRAHYPOFRACTIONATED [facility]) RADIOTHERAPY ? ?SIMULATION AND TREATMENT PLANNING NOTE ? ?  ICD-10-CM   ?1. Metastatic cancer to intrathoracic lymph nodes (HCC)  C77.1   ?  ? ? ?DIAGNOSIS:  81 y.o. gentleman with Stage IV  (pT3b, pN0) high-grade urothelial carcinoma of the bladder with disease progression in the intrathoracic lymph nodes. ? ?NARRATIVE:  The patient was brought to the [facility].  Identity was confirmed.  All relevant records and images related to the planned course of therapy were reviewed.  The patient freely provided informed written consent to proceed with treatment after reviewing the details related to the planned course of therapy. The consent form was witnessed and verified by the simulation staff.  Then, the patient was set-up in a stable reproducible  supine position for radiation therapy.  A BodyFix immobilization pillow was fabricated for reproducible positioning.  Then I personally applied the abdominal compression paddle to limit respiratory excursion.  4D respiratoy motion management CT images were obtained.  Surface markings were placed.  The CT images were loaded into the planning software.  Then, using Cine, MIP, and standard views, the internal target volume (ITV) and planning target volumes (PTV) were delinieated, and avoidance structures were contoured.  Treatment planning then occurred.  The radiation prescription was entered and confirmed.  A total of two complex treatment devices were fabricated in the form of the BodyFix immobilization pillow and a neck accuform cushion.  I have requested : 3D Simulation  I have requested a DVH of the following structures: Heart, Lungs, Esophagus, Chest Wall, Brachial Plexus, Major Blood Vessels, and targets. ? ?SPECIAL TREATMENT PROCEDURE:  The planned course of therapy using  radiation constitutes a special treatment procedure. Special care is required in the management of this patient for the following reasons. This treatment constitutes a Special Treatment Procedure for the following reason: [ High dose per fraction requiring special monitoring for increased toxicities of treatment including daily imaging..  The special nature of the planned course of radiotherapy will require increased physician supervision and oversight to ensure patient's safety with optimal treatment outcomes.  This requires extended time and effort.   ? ?RESPIRATORY MOTION MANAGEMENT SIMULATION:  In order to account for effect of respiratory motion on target structures and other organs in the planning and delivery of radiotherapy, this patient underwent respiratory motion management simulation.  To accomplish this, when the patient was brought to the CT simulation planning suite, 4D respiratoy motion management CT images were obtained.  The CT images were loaded into the planning software.  Then, using a variety of tools including Cine, MIP, and standard views, the target volume and planning target volumes (PTV) were delineated.  Avoidance structures were contoured.  Treatment planning then occurred.  Dose volume histograms were generated and reviewed for each of the requested structure.  The resulting plan was carefully reviewed and approved today. ? ?PLAN:  The patient will receive 50 Gy in 10 fractions. ? ?________________________________ ? ?Sheral Apley Tammi Klippel, M.D. ? ?

## 2021-11-09 DIAGNOSIS — C771 Secondary and unspecified malignant neoplasm of intrathoracic lymph nodes: Secondary | ICD-10-CM | POA: Diagnosis not present

## 2021-11-09 DIAGNOSIS — C679 Malignant neoplasm of bladder, unspecified: Secondary | ICD-10-CM | POA: Diagnosis not present

## 2021-11-09 DIAGNOSIS — C7801 Secondary malignant neoplasm of right lung: Secondary | ICD-10-CM | POA: Diagnosis not present

## 2021-11-10 DIAGNOSIS — L501 Idiopathic urticaria: Secondary | ICD-10-CM | POA: Diagnosis not present

## 2021-11-11 ENCOUNTER — Ambulatory Visit (INDEPENDENT_AMBULATORY_CARE_PROVIDER_SITE_OTHER): Payer: Medicare Other

## 2021-11-11 ENCOUNTER — Other Ambulatory Visit: Payer: Self-pay

## 2021-11-11 DIAGNOSIS — L501 Idiopathic urticaria: Secondary | ICD-10-CM | POA: Diagnosis not present

## 2021-11-16 ENCOUNTER — Other Ambulatory Visit: Payer: Self-pay

## 2021-11-16 ENCOUNTER — Ambulatory Visit
Admission: RE | Admit: 2021-11-16 | Discharge: 2021-11-16 | Disposition: A | Discharge status: Home / Self Care | Payer: Medicare Other | Source: Ambulatory Visit | Attending: Radiation Oncology | Admitting: Radiation Oncology

## 2021-11-16 DIAGNOSIS — C771 Secondary and unspecified malignant neoplasm of intrathoracic lymph nodes: Secondary | ICD-10-CM | POA: Diagnosis not present

## 2021-11-16 DIAGNOSIS — Z510 Encounter for antineoplastic radiation therapy: Secondary | ICD-10-CM | POA: Diagnosis not present

## 2021-11-16 DIAGNOSIS — C7801 Secondary malignant neoplasm of right lung: Secondary | ICD-10-CM | POA: Diagnosis not present

## 2021-11-16 DIAGNOSIS — C679 Malignant neoplasm of bladder, unspecified: Secondary | ICD-10-CM | POA: Diagnosis not present

## 2021-11-17 ENCOUNTER — Ambulatory Visit
Admission: RE | Admit: 2021-11-17 | Discharge: 2021-11-17 | Disposition: A | Discharge status: Home / Self Care | Payer: Medicare Other | Source: Ambulatory Visit | Attending: Radiation Oncology | Admitting: Radiation Oncology

## 2021-11-17 DIAGNOSIS — C771 Secondary and unspecified malignant neoplasm of intrathoracic lymph nodes: Secondary | ICD-10-CM | POA: Diagnosis not present

## 2021-11-17 DIAGNOSIS — Z510 Encounter for antineoplastic radiation therapy: Secondary | ICD-10-CM | POA: Diagnosis not present

## 2021-11-17 DIAGNOSIS — C7801 Secondary malignant neoplasm of right lung: Secondary | ICD-10-CM | POA: Diagnosis not present

## 2021-11-17 DIAGNOSIS — C679 Malignant neoplasm of bladder, unspecified: Secondary | ICD-10-CM | POA: Diagnosis not present

## 2021-11-18 ENCOUNTER — Ambulatory Visit
Admission: RE | Admit: 2021-11-18 | Discharge: 2021-11-18 | Disposition: A | Discharge status: Home / Self Care | Payer: Medicare Other | Source: Ambulatory Visit | Attending: Radiation Oncology | Admitting: Radiation Oncology

## 2021-11-18 ENCOUNTER — Other Ambulatory Visit: Payer: Self-pay

## 2021-11-18 DIAGNOSIS — C7801 Secondary malignant neoplasm of right lung: Secondary | ICD-10-CM | POA: Diagnosis not present

## 2021-11-18 DIAGNOSIS — C771 Secondary and unspecified malignant neoplasm of intrathoracic lymph nodes: Secondary | ICD-10-CM | POA: Diagnosis not present

## 2021-11-18 DIAGNOSIS — Z510 Encounter for antineoplastic radiation therapy: Secondary | ICD-10-CM | POA: Diagnosis not present

## 2021-11-18 DIAGNOSIS — C679 Malignant neoplasm of bladder, unspecified: Secondary | ICD-10-CM | POA: Diagnosis not present

## 2021-11-19 ENCOUNTER — Ambulatory Visit
Admission: RE | Admit: 2021-11-19 | Discharge: 2021-11-19 | Disposition: A | Discharge status: Home / Self Care | Payer: Medicare Other | Source: Ambulatory Visit | Attending: Radiation Oncology | Admitting: Radiation Oncology

## 2021-11-19 DIAGNOSIS — C7801 Secondary malignant neoplasm of right lung: Secondary | ICD-10-CM | POA: Diagnosis not present

## 2021-11-19 DIAGNOSIS — Z510 Encounter for antineoplastic radiation therapy: Secondary | ICD-10-CM | POA: Diagnosis not present

## 2021-11-19 DIAGNOSIS — C771 Secondary and unspecified malignant neoplasm of intrathoracic lymph nodes: Secondary | ICD-10-CM | POA: Diagnosis not present

## 2021-11-19 DIAGNOSIS — C679 Malignant neoplasm of bladder, unspecified: Secondary | ICD-10-CM | POA: Diagnosis not present

## 2021-11-20 ENCOUNTER — Ambulatory Visit
Admission: RE | Admit: 2021-11-20 | Discharge: 2021-11-20 | Disposition: A | Discharge status: Home / Self Care | Payer: Medicare Other | Source: Ambulatory Visit | Attending: Radiation Oncology | Admitting: Radiation Oncology

## 2021-11-20 ENCOUNTER — Other Ambulatory Visit: Payer: Self-pay

## 2021-11-20 DIAGNOSIS — C771 Secondary and unspecified malignant neoplasm of intrathoracic lymph nodes: Secondary | ICD-10-CM | POA: Diagnosis not present

## 2021-11-20 DIAGNOSIS — Z510 Encounter for antineoplastic radiation therapy: Secondary | ICD-10-CM | POA: Diagnosis not present

## 2021-11-20 DIAGNOSIS — C7801 Secondary malignant neoplasm of right lung: Secondary | ICD-10-CM | POA: Diagnosis not present

## 2021-11-20 DIAGNOSIS — C679 Malignant neoplasm of bladder, unspecified: Secondary | ICD-10-CM | POA: Diagnosis not present

## 2021-11-23 ENCOUNTER — Other Ambulatory Visit: Payer: Self-pay

## 2021-11-23 ENCOUNTER — Ambulatory Visit
Admission: RE | Admit: 2021-11-23 | Discharge: 2021-11-23 | Disposition: A | Discharge status: Home / Self Care | Payer: Medicare Other | Source: Ambulatory Visit | Attending: Radiation Oncology | Admitting: Radiation Oncology

## 2021-11-23 DIAGNOSIS — C771 Secondary and unspecified malignant neoplasm of intrathoracic lymph nodes: Secondary | ICD-10-CM | POA: Diagnosis not present

## 2021-11-23 DIAGNOSIS — Z510 Encounter for antineoplastic radiation therapy: Secondary | ICD-10-CM | POA: Diagnosis not present

## 2021-11-23 DIAGNOSIS — C7801 Secondary malignant neoplasm of right lung: Secondary | ICD-10-CM | POA: Diagnosis not present

## 2021-11-23 DIAGNOSIS — C679 Malignant neoplasm of bladder, unspecified: Secondary | ICD-10-CM | POA: Diagnosis not present

## 2021-11-24 ENCOUNTER — Ambulatory Visit
Admission: RE | Admit: 2021-11-24 | Discharge: 2021-11-24 | Disposition: A | Discharge status: Home / Self Care | Payer: Medicare Other | Source: Ambulatory Visit | Attending: Radiation Oncology | Admitting: Radiation Oncology

## 2021-11-24 DIAGNOSIS — Z510 Encounter for antineoplastic radiation therapy: Secondary | ICD-10-CM | POA: Diagnosis not present

## 2021-11-24 DIAGNOSIS — C7801 Secondary malignant neoplasm of right lung: Secondary | ICD-10-CM | POA: Diagnosis not present

## 2021-11-24 DIAGNOSIS — C771 Secondary and unspecified malignant neoplasm of intrathoracic lymph nodes: Secondary | ICD-10-CM | POA: Diagnosis not present

## 2021-11-24 DIAGNOSIS — C679 Malignant neoplasm of bladder, unspecified: Secondary | ICD-10-CM | POA: Diagnosis not present

## 2021-11-25 ENCOUNTER — Ambulatory Visit
Admission: RE | Admit: 2021-11-25 | Discharge: 2021-11-25 | Disposition: A | Discharge status: Home / Self Care | Payer: Medicare Other | Source: Ambulatory Visit | Attending: Radiation Oncology | Admitting: Radiation Oncology

## 2021-11-25 ENCOUNTER — Other Ambulatory Visit: Payer: Self-pay

## 2021-11-25 ENCOUNTER — Ambulatory Visit: Payer: Medicare Other

## 2021-11-25 DIAGNOSIS — C771 Secondary and unspecified malignant neoplasm of intrathoracic lymph nodes: Secondary | ICD-10-CM | POA: Diagnosis not present

## 2021-11-25 DIAGNOSIS — Z510 Encounter for antineoplastic radiation therapy: Secondary | ICD-10-CM | POA: Diagnosis not present

## 2021-11-25 DIAGNOSIS — C679 Malignant neoplasm of bladder, unspecified: Secondary | ICD-10-CM | POA: Diagnosis not present

## 2021-11-25 DIAGNOSIS — C7801 Secondary malignant neoplasm of right lung: Secondary | ICD-10-CM | POA: Diagnosis not present

## 2021-11-26 ENCOUNTER — Ambulatory Visit
Admission: RE | Admit: 2021-11-26 | Discharge: 2021-11-26 | Disposition: A | Discharge status: Home / Self Care | Payer: Medicare Other | Source: Ambulatory Visit | Attending: Radiation Oncology | Admitting: Radiation Oncology

## 2021-11-26 DIAGNOSIS — C771 Secondary and unspecified malignant neoplasm of intrathoracic lymph nodes: Secondary | ICD-10-CM | POA: Diagnosis not present

## 2021-11-26 DIAGNOSIS — C7801 Secondary malignant neoplasm of right lung: Secondary | ICD-10-CM | POA: Diagnosis not present

## 2021-11-26 DIAGNOSIS — Z510 Encounter for antineoplastic radiation therapy: Secondary | ICD-10-CM | POA: Diagnosis not present

## 2021-11-26 DIAGNOSIS — C679 Malignant neoplasm of bladder, unspecified: Secondary | ICD-10-CM | POA: Diagnosis not present

## 2021-11-27 ENCOUNTER — Other Ambulatory Visit: Payer: Self-pay

## 2021-11-27 ENCOUNTER — Encounter: Payer: Self-pay | Admitting: Urology

## 2021-11-27 ENCOUNTER — Ambulatory Visit
Admission: RE | Admit: 2021-11-27 | Discharge: 2021-11-27 | Disposition: A | Discharge status: Home / Self Care | Payer: Medicare Other | Source: Ambulatory Visit | Attending: Radiation Oncology | Admitting: Radiation Oncology

## 2021-11-27 DIAGNOSIS — C7801 Secondary malignant neoplasm of right lung: Secondary | ICD-10-CM | POA: Diagnosis not present

## 2021-11-27 DIAGNOSIS — C771 Secondary and unspecified malignant neoplasm of intrathoracic lymph nodes: Secondary | ICD-10-CM

## 2021-11-27 DIAGNOSIS — C679 Malignant neoplasm of bladder, unspecified: Secondary | ICD-10-CM | POA: Diagnosis not present

## 2021-11-27 DIAGNOSIS — Z510 Encounter for antineoplastic radiation therapy: Secondary | ICD-10-CM | POA: Diagnosis not present

## 2021-11-27 DIAGNOSIS — L501 Idiopathic urticaria: Secondary | ICD-10-CM | POA: Diagnosis not present

## 2021-11-27 MED ORDER — SONAFINE EX EMUL
1.0000 "application " | Freq: Two times a day (BID) | CUTANEOUS | Status: DC
  Administered 2021-11-27: 1 via TOPICAL

## 2021-11-30 ENCOUNTER — Ambulatory Visit (INDEPENDENT_AMBULATORY_CARE_PROVIDER_SITE_OTHER): Payer: Medicare Other

## 2021-11-30 DIAGNOSIS — L501 Idiopathic urticaria: Secondary | ICD-10-CM

## 2021-12-07 ENCOUNTER — Other Ambulatory Visit: Payer: Self-pay

## 2021-12-07 ENCOUNTER — Emergency Department (HOSPITAL_COMMUNITY): Payer: Medicare Other

## 2021-12-07 ENCOUNTER — Emergency Department (HOSPITAL_COMMUNITY)
Admission: EM | Admit: 2021-12-07 | Discharge: 2021-12-07 | Disposition: A | Discharge status: Home / Self Care | Payer: Medicare Other | Attending: Emergency Medicine | Admitting: Emergency Medicine

## 2021-12-07 ENCOUNTER — Encounter (HOSPITAL_COMMUNITY): Payer: Self-pay

## 2021-12-07 DIAGNOSIS — Z20822 Contact with and (suspected) exposure to covid-19: Secondary | ICD-10-CM | POA: Insufficient documentation

## 2021-12-07 DIAGNOSIS — E860 Dehydration: Secondary | ICD-10-CM | POA: Diagnosis not present

## 2021-12-07 DIAGNOSIS — A419 Sepsis, unspecified organism: Secondary | ICD-10-CM | POA: Diagnosis not present

## 2021-12-07 DIAGNOSIS — D72829 Elevated white blood cell count, unspecified: Secondary | ICD-10-CM | POA: Insufficient documentation

## 2021-12-07 DIAGNOSIS — R1110 Vomiting, unspecified: Secondary | ICD-10-CM | POA: Diagnosis not present

## 2021-12-07 DIAGNOSIS — R000 Tachycardia, unspecified: Secondary | ICD-10-CM | POA: Diagnosis not present

## 2021-12-07 DIAGNOSIS — N2889 Other specified disorders of kidney and ureter: Secondary | ICD-10-CM | POA: Diagnosis not present

## 2021-12-07 DIAGNOSIS — Z8551 Personal history of malignant neoplasm of bladder: Secondary | ICD-10-CM | POA: Diagnosis not present

## 2021-12-07 DIAGNOSIS — N12 Tubulo-interstitial nephritis, not specified as acute or chronic: Secondary | ICD-10-CM | POA: Insufficient documentation

## 2021-12-07 DIAGNOSIS — K449 Diaphragmatic hernia without obstruction or gangrene: Secondary | ICD-10-CM | POA: Diagnosis not present

## 2021-12-07 DIAGNOSIS — R509 Fever, unspecified: Secondary | ICD-10-CM | POA: Diagnosis present

## 2021-12-07 DIAGNOSIS — J90 Pleural effusion, not elsewhere classified: Secondary | ICD-10-CM | POA: Diagnosis not present

## 2021-12-07 DIAGNOSIS — N10 Acute tubulo-interstitial nephritis: Secondary | ICD-10-CM | POA: Diagnosis not present

## 2021-12-07 DIAGNOSIS — C679 Malignant neoplasm of bladder, unspecified: Secondary | ICD-10-CM | POA: Diagnosis not present

## 2021-12-07 LAB — CBC WITH DIFFERENTIAL/PLATELET
Abs Immature Granulocytes: 0.06 10*3/uL (ref 0.00–0.07)
Basophils Absolute: 0.1 10*3/uL (ref 0.0–0.1)
Basophils Relative: 1 %
Eosinophils Absolute: 0 10*3/uL (ref 0.0–0.5)
Eosinophils Relative: 0 %
HCT: 38.6 % — ABNORMAL LOW (ref 39.0–52.0)
Hemoglobin: 12.9 g/dL — ABNORMAL LOW (ref 13.0–17.0)
Immature Granulocytes: 1 %
Lymphocytes Relative: 2 %
Lymphs Abs: 0.3 10*3/uL — ABNORMAL LOW (ref 0.7–4.0)
MCH: 35.1 pg — ABNORMAL HIGH (ref 26.0–34.0)
MCHC: 33.4 g/dL (ref 30.0–36.0)
MCV: 105.2 fL — ABNORMAL HIGH (ref 80.0–100.0)
Monocytes Absolute: 1.7 10*3/uL — ABNORMAL HIGH (ref 0.1–1.0)
Monocytes Relative: 13 %
Neutro Abs: 10.9 10*3/uL — ABNORMAL HIGH (ref 1.7–7.7)
Neutrophils Relative %: 83 %
Platelets: 144 10*3/uL — ABNORMAL LOW (ref 150–400)
RBC: 3.67 MIL/uL — ABNORMAL LOW (ref 4.22–5.81)
RDW: 13.1 % (ref 11.5–15.5)
WBC: 13 10*3/uL — ABNORMAL HIGH (ref 4.0–10.5)
nRBC: 0 % (ref 0.0–0.2)

## 2021-12-07 LAB — URINALYSIS, ROUTINE W REFLEX MICROSCOPIC
Bilirubin Urine: NEGATIVE
Glucose, UA: NEGATIVE mg/dL
Hgb urine dipstick: NEGATIVE
Ketones, ur: NEGATIVE mg/dL
Nitrite: NEGATIVE
Protein, ur: 30 mg/dL — AB
Specific Gravity, Urine: 1.012 (ref 1.005–1.030)
pH: 7 (ref 5.0–8.0)

## 2021-12-07 LAB — COMPREHENSIVE METABOLIC PANEL
ALT: 12 U/L (ref 0–44)
AST: 22 U/L (ref 15–41)
Albumin: 3.1 g/dL — ABNORMAL LOW (ref 3.5–5.0)
Alkaline Phosphatase: 41 U/L (ref 38–126)
Anion gap: 6 (ref 5–15)
BUN: 16 mg/dL (ref 8–23)
CO2: 24 mmol/L (ref 22–32)
Calcium: 8.2 mg/dL — ABNORMAL LOW (ref 8.9–10.3)
Chloride: 106 mmol/L (ref 98–111)
Creatinine, Ser: 1.13 mg/dL (ref 0.61–1.24)
GFR, Estimated: >60 mL/min (ref >60)
Glucose, Bld: 115 mg/dL — ABNORMAL HIGH (ref 70–99)
Potassium: 4.7 mmol/L (ref 3.5–5.1)
Sodium: 136 mmol/L (ref 135–145)
Total Bilirubin: 1.4 mg/dL — ABNORMAL HIGH (ref 0.3–1.2)
Total Protein: 6.2 g/dL — ABNORMAL LOW (ref 6.5–8.1)

## 2021-12-07 LAB — LACTIC ACID, PLASMA
Lactic Acid, Venous: 1.5 mmol/L (ref 0.5–1.9)
Lactic Acid, Venous: 1.1 mmol/L (ref 0.5–1.9)

## 2021-12-07 LAB — RESP PANEL BY RT-PCR (FLU A&B, COVID) ARPGX2
Influenza A by PCR: NEGATIVE
Influenza B by PCR: NEGATIVE
SARS Coronavirus 2 by RT PCR: NEGATIVE

## 2021-12-07 MED ORDER — PROMETHAZINE HCL 25 MG RE SUPP: 12.5000 mg | Freq: Four times a day (QID) | RECTAL | 0 refills | Status: DC | PRN

## 2021-12-07 MED ORDER — IOHEXOL 300 MG/ML  SOLN
100.0000 mL | Freq: Once | INTRAMUSCULAR | Status: AC | PRN
  Administered 2021-12-07: 100 mL via INTRAVENOUS

## 2021-12-07 MED ORDER — CEPHALEXIN 500 MG PO CAPS: 500.0000 mg | ORAL_CAPSULE | Freq: Four times a day (QID) | ORAL | 0 refills | Status: DC

## 2021-12-07 MED ORDER — LACTATED RINGERS IV BOLUS
1000.0000 mL | Freq: Once | INTRAVENOUS | Status: AC
  Administered 2021-12-07: 1000 mL via INTRAVENOUS

## 2021-12-07 MED ORDER — LACTATED RINGERS IV BOLUS (SEPSIS)
1000.0000 mL | Freq: Once | INTRAVENOUS | Status: AC
  Administered 2021-12-07: 1000 mL via INTRAVENOUS

## 2021-12-07 MED ORDER — SODIUM CHLORIDE 0.9 % IV SOLN
2.0000 g | Freq: Once | INTRAVENOUS | Status: AC
  Administered 2021-12-07: 2 g via INTRAVENOUS
  Filled 2021-12-07: qty 20

## 2021-12-07 NOTE — ED Provider Notes (Signed)
?[facility] ?Provider Note ? ? ?CSN: [id number] ?Arrival date & time: 12/07/21  2119 ? ?  ? ?History ? ?Chief Complaint  ?Patient presents with  ? Fever  ? ? ?[name] is a 81 y.o. male. ? ?Pt is an 82 yo male with pmhx significant for bph, gerd, chronic idiopathic urticaria (on omalizumab) and bladder cancer s/p urostomy placement and mets to lungs and to bone.  Pt is not getting current chemo, but just finished radiation treatment on his mets.  Pt developed a fever on Friday, 4/21.  It has continued all weekend.  He's also had vomiting and has not eaten in a few days.  Pt denies any new pain.   ? ? ?  ? ?Home Medications ?Prior to Admission medications   ?Medication Sig Start Date End Date Taking? Authorizing Provider  ?acetaminophen (TYLENOL) 325 MG tablet Take 2 tablets (650 mg total) by mouth every 6 (six) hours as needed for mild pain, fever or headache (or Fever >/= 101). 06/19/20  Yes Roxan Hockey, MD  ?cephALEXin (KEFLEX) 500 MG capsule Take 1 capsule (500 mg total) by mouth 4 (four) times daily. 12/07/21  Yes Isla Pence, MD  ?clotrimazole-betamethasone (LOTRISONE) cream Apply 1 application topically in the morning and at bedtime. 06/27/20  Yes [provider]  ?HYDROmorphone (DILAUDID) 4 MG tablet TAKE 1 TABLET BY MOUTH EVERY 4 HOURS AS NEEDED FOR SEVERE PAIN. ?Patient taking differently: Take 4 mg by mouth every 4 (four) hours as needed for severe pain. 10/13/21  Yes [name], MD  ?levothyroxine (SYNTHROID) 50 MCG tablet Take 50 mcg by mouth daily. 10/01/21  Yes [provider]  ?oxyCODONE (OXY IR/ROXICODONE) 5 MG immediate release tablet TAKE (1) TO (2) TABLETS BY MOUTH EVERY (4) HOURS AS NEEDED FOR SEVERE PAIN. ?Patient taking differently: Take 5-10 mg by mouth every 4 (four) hours as needed for moderate pain or severe pain. 10/13/21  Yes [name], MD  ?polyethylene glycol (MIRALAX / GLYCOLAX) 17 g packet Take 17 g by mouth daily as needed  for mild constipation. 07/18/20  Yes Roxan Hockey, MD  ?promethazine (PHENERGAN) 25 MG suppository Place 0.5 suppositories (12.5 mg total) rectally every 6 (six) hours as needed for nausea or vomiting. 12/07/21  Yes Isla Pence, MD  ?sulfamethoxazole-trimethoprim (BACTRIM DS) 800-160 MG tablet Take 1 tablet by mouth daily. 07/30/21  Yes [provider]  ?lactose free nutrition (BOOST) LIQD Take 237 mLs by mouth 3 (three) times daily between meals.     [provider]  ?   ? ?Allergies    ?Ciprofloxacin, Cefepime, and Zofran [ondansetron]   ? ?Review of Systems   ?Review of Systems  ?Constitutional:  Positive for fatigue and fever.  ?Gastrointestinal:  Positive for vomiting.  ?Neurological:  Positive for weakness.  ?All other systems reviewed and are negative. ? ?Physical Exam ?Updated Vital Signs ?BP 113/74   Pulse 88   Temp 98.6 ?F (37 ?C) (Oral)   Resp (!) 21   Ht '5\' 11"'$  (1.803 m)   Wt 90.7 kg   SpO2 97%   BMI 27.89 kg/m?  ?Physical Exam ?Vitals and nursing note reviewed.  ?Constitutional:   ?   Appearance: Normal appearance.  ?HENT:  ?   Head: Normocephalic and atraumatic.  ?   Right Ear: External ear normal.  ?   Left Ear: External ear normal.  ?   Nose: Nose normal.  ?   Mouth/Throat:  ?   Mouth: Mucous  membranes are dry.  ?Eyes:  ?   Extraocular Movements: Extraocular movements intact.  ?   Conjunctiva/sclera: Conjunctivae normal.  ?   Pupils: Pupils are equal, round, and reactive to light.  ?Cardiovascular:  ?   Rate and Rhythm: Regular rhythm. Tachycardia present.  ?   Pulses: Normal pulses.  ?   Heart sounds: Normal heart sounds.  ?Pulmonary:  ?   Effort: Pulmonary effort is normal.  ?   Breath sounds: Normal breath sounds.  ?Abdominal:  ?   General: Abdomen is flat. Bowel sounds are normal.  ?   Palpations: Abdomen is soft.  ?Musculoskeletal:     ?   General: Normal range of motion.  ?   Cervical back: Normal range of motion and neck supple.  ?Skin: ?   General: Skin is warm.   ?   Capillary Refill: Capillary refill takes less than 2 seconds.  ?Neurological:  ?   General: No focal deficit present.  ?   Mental Status: He is alert and oriented to person, place, and time.  ?Psychiatric:     ?   Mood and Affect: Mood normal.     ?   Behavior: Behavior normal.     ?   Thought Content: Thought content normal.     ?   Judgment: Judgment normal.  ? ? ?ED Results / Procedures / Treatments   ?Labs ?(all labs ordered are listed, but only abnormal results are displayed) ?Labs Reviewed  ?COMPREHENSIVE METABOLIC PANEL - Abnormal; Notable for the following components:  ?    Result Value  ? Glucose, Bld 115 (*)   ? Calcium 8.2 (*)   ? Total Protein 6.2 (*)   ? Albumin 3.1 (*)   ? Total Bilirubin 1.4 (*)   ? All other components within normal limits  ?CBC WITH DIFFERENTIAL/PLATELET - Abnormal; Notable for the following components:  ? WBC 13.0 (*)   ? RBC 3.67 (*)   ? Hemoglobin 12.9 (*)   ? HCT 38.6 (*)   ? MCV 105.2 (*)   ? MCH 35.1 (*)   ? Platelets 144 (*)   ? Neutro Abs 10.9 (*)   ? Lymphs Abs 0.3 (*)   ? Monocytes Absolute 1.7 (*)   ? All other components within normal limits  ?URINALYSIS, ROUTINE W REFLEX MICROSCOPIC - Abnormal; Notable for the following components:  ? APPearance HAZY (*)   ? Protein, ur 30 (*)   ? Leukocytes,Ua MODERATE (*)   ? Bacteria, UA RARE (*)   ? Non Squamous Epithelial 0-5 (*)   ? All other components within normal limits  ?CULTURE, BLOOD (ROUTINE X 2)  ?CULTURE, BLOOD (ROUTINE X 2)  ?RESP PANEL BY RT-PCR (FLU A&B, COVID) ARPGX2  ?URINE CULTURE  ?LACTIC ACID, PLASMA  ?LACTIC ACID, PLASMA  ? ? ?EKG ?EKG Interpretation ? ?Date/Time:  Monday December 07 2021 08:23:10 EDT ?Ventricular Rate:  107 ?PR Interval:  174 ?QRS Duration: 80 ?QT Interval:  337 ?QTC Calculation: 450 ?R Axis:   -36 ?Text Interpretation: Sinus tachycardia Atrial premature complex Left axis deviation Abnormal R-wave progression, early transition Since last tracing rate faster Confirmed by Isla Pence  [id number]) on 12/07/2021 8:25:02 AM ? ?Radiology ?CT CHEST ABDOMEN PELVIS W CONTRAST ? ?Result Date: 12/07/2021 ?CLINICAL DATA:  History of metastatic urothelial carcinoma. Sepsis. Complains of fever and vomiting. EXAM: CT CHEST, ABDOMEN, AND PELVIS WITH CONTRAST TECHNIQUE: Multidetector CT imaging of the chest, abdomen and pelvis was performed following the standard protocol during  bolus administration of intravenous contrast. RADIATION DOSE REDUCTION: This exam was performed according to the departmental dose-optimization program which includes automated exposure control, adjustment of the mA and/or kV according to patient size and/or use of iterative reconstruction technique. CONTRAST:  165m OMNIPAQUE IOHEXOL 300 MG/ML  SOLN COMPARISON:  PET-CT 10/22/2021 FINDINGS: CT CHEST FINDINGS Cardiovascular: Aortic atherosclerosis and coronary artery calcifications. Normal heart size. No pericardial effusion. Mediastinum/Nodes: No enlarged axillary or supraclavicular lymph nodes. Tracer avid subcarinal lymph node measures 1.3 cm, image 33/2. This is not significantly changed from previous exam. Lungs/Pleura: Trace right pleural effusion is decreased from previous exam. Left pleural effusion has resolved in the interval. Bandlike area of architectural distortion within the right apex is stable from previous exam, image 35/4. Anterior left upper lobe lung nodule is stable measuring 1.2 cm, image 46/4. Subpleural nodular density within the anteromedial left upper lobe is stable measuring 6 mm, image 90/4. 4 mm nodule in the superior segment of left lower lobe is unchanged, image 79/4. No airspace consolidation, pleural effusion, or pneumothorax. Musculoskeletal: No chest wall mass or suspicious bone lesions identified. No acute or suspicious osseous findings. CT ABDOMEN PELVIS FINDINGS Hepatobiliary: Multiple liver cysts are again noted and appears stable. The largest is in segment 6 measuring 2.7 cm. Decompressed gallbladder is  unremarkable. No bile duct dilatation identified. Pancreas: Unremarkable. No pancreatic ductal dilatation or surrounding inflammatory changes. Spleen: Normal in size without focal abnormality. Adrenals/Urinary Trac

## 2021-12-07 NOTE — Discharge Instructions (Addendum)
Return if worse

## 2021-12-07 NOTE — ED Triage Notes (Signed)
Bladder cancer patient with complaints of fever and vomiting that started Friday with highest tempeture of 102.  ?

## 2021-12-10 LAB — URINE CULTURE: Culture: >=100000 — AB

## 2021-12-10 LAB — CARBAPENEM RESISTANCE PANEL
Carba Resistance IMP Gene: NOT DETECTED
Carba Resistance KPC Gene: NOT DETECTED
Carba Resistance NDM Gene: NOT DETECTED
Carba Resistance OXA48 Gene: NOT DETECTED
Carba Resistance VIM Gene: NOT DETECTED

## 2021-12-11 ENCOUNTER — Telehealth: Payer: Self-pay | Admitting: *Deleted

## 2021-12-11 DIAGNOSIS — L501 Idiopathic urticaria: Secondary | ICD-10-CM | POA: Diagnosis not present

## 2021-12-11 NOTE — Progress Notes (Signed)
ED Antimicrobial Stewardship Positive Culture Follow Up  ? ?[name] is an 81 y.o. male who presented to [facility] on 12/07/2021 with a chief complaint of  ?Chief Complaint  ?Patient presents with  ? Fever  ? ? ?Recent Results (from the past 720 hour(s))  ?Urine Culture     Status: Abnormal  ? Collection Time: 12/07/21  8:03 AM  ? Specimen: Urine, Clean Catch  ?Result Value Ref Range Status  ? Specimen Description   Final  ?  URINE, CLEAN CATCH ?Performed at [facility], [address], [place], [place] [address] ?  ? Special Requests   Final  ?  NONE ?Performed at [facility], [address], [place], [place] [address] ?  ? Culture (A)  Final  ?  >=100,000 COLONIES/mL ENTEROCOCCUS FAECALIS ?50,000 COLONIES/mL ENTEROBACTER AEROGENES ?  ? Report Status 12/10/2021 FINAL  Final  ? Organism ID, Bacteria ENTEROCOCCUS FAECALIS (A)  Final  ? Organism ID, Bacteria ENTEROBACTER AEROGENES (A)  Final  ?    Susceptibility  ? Enterobacter aerogenes - MIC*  ?  CEFAZOLIN >=64 RESISTANT Resistant   ?  CEFEPIME <=0.12 SENSITIVE Sensitive   ?  CEFTRIAXONE 32 RESISTANT Resistant   ?  CIPROFLOXACIN <=0.25 SENSITIVE Sensitive   ?  GENTAMICIN <=1 SENSITIVE Sensitive   ?  IMIPENEM 1 SENSITIVE Sensitive   ?  NITROFURANTOIN >=512 RESISTANT Resistant   ?  TRIMETH/SULFA <=20 SENSITIVE Sensitive   ?  PIP/TAZO 32 INTERMEDIATE Intermediate   ?  * 50,000 COLONIES/mL ENTEROBACTER AEROGENES  ? Enterococcus faecalis - MIC*  ?  AMPICILLIN <=2 SENSITIVE Sensitive   ?  NITROFURANTOIN <=16 SENSITIVE Sensitive   ?  VANCOMYCIN 1 SENSITIVE Sensitive   ?  * >=100,000 COLONIES/mL ENTEROCOCCUS FAECALIS  ?Resp Panel by RT-PCR (Flu A&B, Covid) Nasopharyngeal Swab     Status: None  ? Collection Time: 12/07/21  8:04 AM  ? Specimen: Nasopharyngeal Swab; Nasopharyngeal(NP) swabs in vial transport medium  ?Result Value Ref Range Status  ? SARS Coronavirus 2 by RT PCR NEGATIVE NEGATIVE Final  ?  Comment: (NOTE) ?SARS-CoV-2 target nucleic acids are NOT  DETECTED. ? ?The SARS-CoV-2 RNA is generally detectable in upper respiratory ?specimens during the acute phase of infection. The lowest ?concentration of SARS-CoV-2 viral copies this assay can detect is ?138 copies/mL. A negative result does not preclude SARS-Cov-2 ?infection and should not be used as the sole basis for treatment or ?other patient management decisions. A negative result may occur with  ?improper specimen collection/handling, submission of specimen other ?than nasopharyngeal swab, presence of viral mutation(s) within the ?areas targeted by this assay, and inadequate number of viral ?copies(<138 copies/mL). A negative result must be combined with ?clinical observations, patient history, and epidemiological ?information. The expected result is Negative. ? ?Fact Sheet for Patients:  ?EntrepreneurPulse.com.au ? ?Fact Sheet for Healthcare Providers:  ?IncredibleEmployment.be ? ?This test is no t yet approved or cleared by the [place] FDA and  ?has been authorized for detection and/or diagnosis of SARS-CoV-2 by ?FDA under an Emergency Use Authorization (EUA). This EUA will remain  ?in effect (meaning this test can be used) for the duration of the ?COVID-19 declaration under Section 564(b)(1) of the Act, 21 ?U.S.C.section 360bbb-3(b)(1), unless the authorization is terminated  ?or revoked sooner.  ? ? ?  ? Influenza A by PCR NEGATIVE NEGATIVE Final  ? Influenza B by PCR NEGATIVE NEGATIVE Final  ?  Comment: (NOTE) ?The Xpert Xpress SARS-CoV-2/FLU/RSV plus assay is intended as an  aid ?in the diagnosis of influenza from Nasopharyngeal swab specimens and ?should not be used as a sole basis for treatment. Nasal washings and ?aspirates are unacceptable for Xpert Xpress SARS-CoV-2/FLU/RSV ?testing. ? ?Fact Sheet for Patients: ?EntrepreneurPulse.com.au ? ?Fact Sheet for Healthcare Providers: ?IncredibleEmployment.be ? ?This test is not yet  approved or cleared by the [place] FDA and ?has been authorized for detection and/or diagnosis of SARS-CoV-2 by ?FDA under an Emergency Use Authorization (EUA). This EUA will remain ?in effect (meaning this test can be used) for the duration of the ?COVID-19 declaration under Section 564(b)(1) of the Act, 21 U.S.C. ?section 360bbb-3(b)(1), unless the authorization is terminated or ?revoked. ? ?Performed at [facility], [address], [place], [place] [address] ?  ?Blood Culture (routine x 2)     Status: None (Preliminary result)  ? Collection Time: 12/07/21  8:21 AM  ? Specimen: BLOOD RIGHT HAND  ?Result Value Ref Range Status  ? Specimen Description BLOOD RIGHT HAND  Final  ? Special Requests   Final  ?  BOTTLES DRAWN AEROBIC AND ANAEROBIC Blood Culture results may not be optimal due to an inadequate volume of blood received in culture bottles  ? Culture   Final  ?  NO GROWTH 4 DAYS ?Performed at [facility], [address], [place], [place] [address] ?  ? Report Status PENDING  Incomplete  ?Blood Culture (routine x 2)     Status: None (Preliminary result)  ? Collection Time: 12/07/21  8:21 AM  ? Specimen: BLOOD LEFT HAND  ?Result Value Ref Range Status  ? Specimen Description BLOOD LEFT HAND  Final  ? Special Requests   Final  ?  BOTTLES DRAWN AEROBIC AND ANAEROBIC Blood Culture adequate volume  ? Culture   Final  ?  NO GROWTH 4 DAYS ?Performed at [facility], [address], [place], [place] [address] ?  ? Report Status PENDING  Incomplete  ?Carbapenem Resistance Panel     Status: None  ? Collection Time: 12/07/21  4:00 PM  ?Result Value Ref Range Status  ? Carba Resistance IMP Gene NOT DETECTED NOT DETECTED Final  ? Carba Resistance VIM Gene NOT DETECTED NOT DETECTED Final  ? Carba Resistance NDM Gene NOT DETECTED NOT DETECTED Final  ? Carba Resistance KPC Gene NOT DETECTED NOT DETECTED Final  ? Carba Resistance OXA48 Gene NOT DETECTED NOT DETECTED Final  ?  Comment: (NOTE) ?Cepheid Carba-R is an  FDA-cleared nucleic acid amplification test  ?(NAAT)for the detection and differentiation of genes encoding the  ?most prevalent carbapenemases in bacterial isolate samples. ?Carbapenemase gene identification and implementation of comprehensive  ?infection control measures are recommended by the CDC to prevent the  ?spread of the resistant organisms. ?Performed at [facility], [place] [address] [place], [place] ?[address] ?  ? ? ?'[x]'$  Treated with Keflex/Bactrim, organism resistant to prescribed antimicrobial ?'[]'$  Patient discharged originally without antimicrobial agent and treatment is now indicated ? ?New antibiotic prescription: d/c Keflex, cont. Bactrim, add Augmentin ? ?ED Provider: Alyse Low, PA-C ? ? ?Bertis Ruddy ?12/11/2021, 7:54 AM ?Clinical Pharmacist ?Monday - Friday phone -  [phone] ?Saturday - Sunday phone - [phone] ? ?

## 2021-12-11 NOTE — Telephone Encounter (Signed)
Post ED Visit - Positive Culture Follow-up: Unsuccessful Patient Follow-up ? ?Culture assessed and recommendations reviewed by: ? ?'[]'$  Elenor Quinones, Pharm.D. ?'[]'$  Heide Guile, Pharm.D., BCPS AQ-ID ?'[]'$  Parks Neptune, Pharm.D., BCPS ?'[]'$  Alycia Rossetti, Pharm.D., BCPS ?'[]'$  [place], Pharm.D., BCPS, AAHIVP ?'[]'$  Legrand Como, Pharm.D., BCPS, AAHIVP ?'[]'$  Wynell Balloon, PharmD ?'[]'$  Vincenza Hews, PharmD, BCPS ? ?Positive urine culture ? ?'[]'$  Patient discharged without antimicrobial prescription and treatment is now indicated ?'[x]'$  Organism is resistant to prescribed ED discharge antimicrobial ?'[]'$  Patient with positive blood cultures ? ?Plan:  D/C Keflex, continue Bactrim.  Add Augmentin '875mg'$  BID x 10 days, Alyse Low PA-C ? ?Unable to contact patient after 3 attempts, letter will be sent to address on file ? ?Ardeen Fillers ?12/11/2021, 10:25 AM  ?

## 2021-12-12 LAB — CULTURE, BLOOD (ROUTINE X 2)
Culture: NO GROWTH
Culture: NO GROWTH
Special Requests: ADEQUATE

## 2021-12-14 ENCOUNTER — Other Ambulatory Visit: Payer: Self-pay | Admitting: Oncology

## 2021-12-14 ENCOUNTER — Ambulatory Visit (INDEPENDENT_AMBULATORY_CARE_PROVIDER_SITE_OTHER): Payer: Medicare Other

## 2021-12-14 ENCOUNTER — Telehealth: Payer: Self-pay | Admitting: Oncology

## 2021-12-14 ENCOUNTER — Telehealth: Payer: Self-pay | Admitting: *Deleted

## 2021-12-14 DIAGNOSIS — L501 Idiopathic urticaria: Secondary | ICD-10-CM | POA: Diagnosis not present

## 2021-12-14 DIAGNOSIS — C679 Malignant neoplasm of bladder, unspecified: Secondary | ICD-10-CM

## 2021-12-14 MED ORDER — OXYCODONE HCL 5 MG PO TABS: 5.0000 mg | ORAL_TABLET | ORAL | 0 refills | Status: DC | PRN

## 2021-12-14 MED ORDER — HYDROMORPHONE HCL 4 MG PO TABS: 4.0000 mg | ORAL_TABLET | ORAL | 0 refills | Status: DC | PRN

## 2021-12-14 NOTE — Telephone Encounter (Signed)
Per 5/1 in basket called pt to discuss upcoming appointments.  Pt confirmed appointments  ?

## 2021-12-14 NOTE — Telephone Encounter (Signed)
[name] needs a refill of Hydromorphone and Oxycodone sent to [facility] ?

## 2021-12-15 ENCOUNTER — Other Ambulatory Visit: Payer: Self-pay | Admitting: Oncology

## 2021-12-15 DIAGNOSIS — C679 Malignant neoplasm of bladder, unspecified: Secondary | ICD-10-CM

## 2021-12-15 MED ORDER — HYDROMORPHONE HCL 4 MG PO TABS: 4.0000 mg | ORAL_TABLET | ORAL | 0 refills | Status: DC | PRN

## 2021-12-18 NOTE — Telephone Encounter (Signed)
Post ED Visit - Positive Culture Follow-up: Successful Patient Follow-Up ? ?Culture assessed and recommendations reviewed by: ? ?'[]'$  Elenor Quinones, Pharm.D. ?'[]'$  Heide Guile, Pharm.D., BCPS AQ-ID ?'[]'$  Parks Neptune, Pharm.D., BCPS ?'[]'$  Alycia Rossetti, Pharm.D., BCPS ?'[]'$  [place], [place].D., BCPS, AAHIVP ?'[]'$  Legrand Como, Pharm.D., BCPS, AAHIVP ?'[]'$  Salome Arnt, PharmD, BCPS ?'[]'$  Johnnette Gourd, PharmD, BCPS ?'[]'$  Hughes Better, PharmD, BCPS ?'[]'$  Leeroy Cha, PharmD ? ?Positive urine culture ? ?'[]'$  Patient discharged without antimicrobial prescription and treatment is now indicated ?'[x]'$  Organism is resistant to prescribed ED discharge antimicrobial ?'[]'$  Patient with positive blood cultures ? ?Changes discussed with ED provider: Alyse Low, PA ?New antibiotic prescription Augmentin 875 mg PO BID x 10 days ?Called to [facility], [place] ? ?Contacted patient, date 12/18/2021, time 0906 ? ? ?[name] ?12/18/2021, 9:05 AM ? ?  ?

## 2021-12-23 ENCOUNTER — Inpatient Hospital Stay (HOSPITAL_COMMUNITY)
Admission: EM | Admit: 2021-12-23 | Discharge: 2021-12-26 | DRG: 690 | Disposition: A | Discharge status: Home / Self Care | Payer: Medicare Other | Attending: Internal Medicine | Admitting: Internal Medicine

## 2021-12-23 ENCOUNTER — Other Ambulatory Visit: Payer: Self-pay

## 2021-12-23 ENCOUNTER — Encounter (HOSPITAL_COMMUNITY): Payer: Self-pay

## 2021-12-23 ENCOUNTER — Emergency Department (HOSPITAL_COMMUNITY): Payer: Medicare Other

## 2021-12-23 DIAGNOSIS — N12 Tubulo-interstitial nephritis, not specified as acute or chronic: Secondary | ICD-10-CM | POA: Diagnosis not present

## 2021-12-23 DIAGNOSIS — C679 Malignant neoplasm of bladder, unspecified: Secondary | ICD-10-CM | POA: Diagnosis present

## 2021-12-23 DIAGNOSIS — E039 Hypothyroidism, unspecified: Secondary | ICD-10-CM | POA: Diagnosis present

## 2021-12-23 DIAGNOSIS — G8929 Other chronic pain: Secondary | ICD-10-CM | POA: Diagnosis present

## 2021-12-23 DIAGNOSIS — R109 Unspecified abdominal pain: Secondary | ICD-10-CM | POA: Diagnosis not present

## 2021-12-23 DIAGNOSIS — N136 Pyonephrosis: Principal | ICD-10-CM | POA: Diagnosis present

## 2021-12-23 DIAGNOSIS — K6389 Other specified diseases of intestine: Secondary | ICD-10-CM | POA: Diagnosis not present

## 2021-12-23 DIAGNOSIS — K7689 Other specified diseases of liver: Secondary | ICD-10-CM | POA: Diagnosis not present

## 2021-12-23 DIAGNOSIS — C771 Secondary and unspecified malignant neoplasm of intrathoracic lymph nodes: Secondary | ICD-10-CM | POA: Diagnosis present

## 2021-12-23 DIAGNOSIS — Z881 Allergy status to other antibiotic agents status: Secondary | ICD-10-CM | POA: Diagnosis not present

## 2021-12-23 DIAGNOSIS — Z811 Family history of alcohol abuse and dependence: Secondary | ICD-10-CM | POA: Diagnosis not present

## 2021-12-23 DIAGNOSIS — J449 Chronic obstructive pulmonary disease, unspecified: Secondary | ICD-10-CM | POA: Diagnosis present

## 2021-12-23 DIAGNOSIS — Z936 Other artificial openings of urinary tract status: Secondary | ICD-10-CM | POA: Diagnosis not present

## 2021-12-23 DIAGNOSIS — N134 Hydroureter: Secondary | ICD-10-CM | POA: Diagnosis not present

## 2021-12-23 DIAGNOSIS — B952 Enterococcus as the cause of diseases classified elsewhere: Secondary | ICD-10-CM | POA: Diagnosis present

## 2021-12-23 DIAGNOSIS — Z8551 Personal history of malignant neoplasm of bladder: Secondary | ICD-10-CM | POA: Diagnosis not present

## 2021-12-23 DIAGNOSIS — N1330 Unspecified hydronephrosis: Secondary | ICD-10-CM | POA: Diagnosis not present

## 2021-12-23 DIAGNOSIS — Z888 Allergy status to other drugs, medicaments and biological substances status: Secondary | ICD-10-CM

## 2021-12-23 DIAGNOSIS — K429 Umbilical hernia without obstruction or gangrene: Secondary | ICD-10-CM | POA: Diagnosis not present

## 2021-12-23 DIAGNOSIS — A0472 Enterocolitis due to Clostridium difficile, not specified as recurrent: Secondary | ICD-10-CM | POA: Diagnosis not present

## 2021-12-23 DIAGNOSIS — I700 Atherosclerosis of aorta: Secondary | ICD-10-CM | POA: Diagnosis not present

## 2021-12-23 DIAGNOSIS — K449 Diaphragmatic hernia without obstruction or gangrene: Secondary | ICD-10-CM | POA: Diagnosis not present

## 2021-12-23 DIAGNOSIS — Z79899 Other long term (current) drug therapy: Secondary | ICD-10-CM

## 2021-12-23 DIAGNOSIS — N10 Acute tubulo-interstitial nephritis: Secondary | ICD-10-CM | POA: Diagnosis not present

## 2021-12-23 DIAGNOSIS — K5730 Diverticulosis of large intestine without perforation or abscess without bleeding: Secondary | ICD-10-CM | POA: Diagnosis not present

## 2021-12-23 DIAGNOSIS — K219 Gastro-esophageal reflux disease without esophagitis: Secondary | ICD-10-CM | POA: Diagnosis present

## 2021-12-23 DIAGNOSIS — N400 Enlarged prostate without lower urinary tract symptoms: Secondary | ICD-10-CM | POA: Diagnosis not present

## 2021-12-23 DIAGNOSIS — Z87891 Personal history of nicotine dependence: Secondary | ICD-10-CM

## 2021-12-23 DIAGNOSIS — Z6372 Alcoholism and drug addiction in family: Secondary | ICD-10-CM | POA: Diagnosis not present

## 2021-12-23 DIAGNOSIS — Z79890 Hormone replacement therapy (postmenopausal): Secondary | ICD-10-CM

## 2021-12-23 LAB — BASIC METABOLIC PANEL
Anion gap: 11 (ref 5–15)
BUN: 16 mg/dL (ref 8–23)
CO2: 24 mmol/L (ref 22–32)
Calcium: 9 mg/dL (ref 8.9–10.3)
Chloride: 104 mmol/L (ref 98–111)
Creatinine, Ser: 1.17 mg/dL (ref 0.61–1.24)
GFR, Estimated: >60 mL/min (ref >60)
Glucose, Bld: 127 mg/dL — ABNORMAL HIGH (ref 70–99)
Potassium: 4.5 mmol/L (ref 3.5–5.1)
Sodium: 139 mmol/L (ref 135–145)

## 2021-12-23 LAB — URINALYSIS, ROUTINE W REFLEX MICROSCOPIC
Bilirubin Urine: NEGATIVE
Glucose, UA: NEGATIVE mg/dL
Ketones, ur: NEGATIVE mg/dL
Nitrite: POSITIVE — AB
Protein, ur: 100 mg/dL — AB
Specific Gravity, Urine: 1.015 (ref 1.005–1.030)
pH: 6.5 (ref 5.0–8.0)

## 2021-12-23 LAB — CBC
HCT: 48.2 % (ref 39.0–52.0)
Hemoglobin: 15.5 g/dL (ref 13.0–17.0)
MCH: 34.4 pg — ABNORMAL HIGH (ref 26.0–34.0)
MCHC: 32.2 g/dL (ref 30.0–36.0)
MCV: 106.9 fL — ABNORMAL HIGH (ref 80.0–100.0)
Platelets: 224 10*3/uL (ref 150–400)
RBC: 4.51 MIL/uL (ref 4.22–5.81)
RDW: 13.2 % (ref 11.5–15.5)
WBC: 25 10*3/uL — ABNORMAL HIGH (ref 4.0–10.5)
nRBC: 0 % (ref 0.0–0.2)

## 2021-12-23 LAB — URINALYSIS, MICROSCOPIC (REFLEX)

## 2021-12-23 LAB — LACTIC ACID, PLASMA: Lactic Acid, Venous: 1.8 mmol/L (ref 0.5–1.9)

## 2021-12-23 MED ORDER — POLYETHYLENE GLYCOL 3350 17 G PO PACK: 17.0000 g | PACK | Freq: Every day | ORAL | Status: DC | PRN

## 2021-12-23 MED ORDER — ENOXAPARIN SODIUM 40 MG/0.4ML IJ SOSY: 40.0000 mg | PREFILLED_SYRINGE | INTRAMUSCULAR | Status: DC

## 2021-12-23 MED ORDER — ALBUTEROL SULFATE (2.5 MG/3ML) 0.083% IN NEBU: 2.5000 mg | INHALATION_SOLUTION | RESPIRATORY_TRACT | Status: DC | PRN

## 2021-12-23 MED ORDER — HYDROMORPHONE HCL 1 MG/ML IJ SOLN
0.5000 mg | INTRAMUSCULAR | Status: DC | PRN
  Administered 2021-12-23: 0.5 mg via INTRAVENOUS
  Filled 2021-12-23: qty 0.5

## 2021-12-23 MED ORDER — LACTATED RINGERS IV SOLN: INTRAVENOUS | Status: DC

## 2021-12-23 MED ORDER — LEVOTHYROXINE SODIUM 50 MCG PO TABS
50.0000 ug | ORAL_TABLET | Freq: Every day | ORAL | Status: DC
  Administered 2021-12-24 – 2021-12-26 (×3): 50 ug via ORAL
  Filled 2021-12-23 (×3): qty 1

## 2021-12-23 MED ORDER — ACETAMINOPHEN 500 MG PO TABS: 500.0000 mg | ORAL_TABLET | Freq: Four times a day (QID) | ORAL | Status: DC | PRN

## 2021-12-23 MED ORDER — OXYCODONE HCL 5 MG PO TABS: 10.0000 mg | ORAL_TABLET | ORAL | Status: DC | PRN

## 2021-12-23 MED ORDER — SODIUM CHLORIDE 0.9 % IV SOLN
1.0000 g | Freq: Three times a day (TID) | INTRAVENOUS | Status: DC
  Administered 2021-12-23 – 2021-12-24 (×2): 1 g via INTRAVENOUS
  Filled 2021-12-23 (×2): qty 20

## 2021-12-23 MED ORDER — BOOST PO LIQD
237.0000 mL | Freq: Three times a day (TID) | ORAL | Status: DC
  Administered 2021-12-24: 237 mL via ORAL
  Filled 2021-12-23 (×11): qty 237

## 2021-12-23 NOTE — ED Triage Notes (Signed)
Reports uti symptoms that started today.  Reports pain in back.  Resp even and unlabored.  Skin warm and dry.  Pt has urostomy.  ?

## 2021-12-23 NOTE — H&P (Signed)
? ?                                                                                                       TRH H&P ? ? Patient Demographics:  ? ? [name], is a 81 y.o. male  MRN: [id number]   DOB - 09-Sep-1940 ? ?Admit Date - 12/23/2021 ? ?Outpatient Primary MD for the patient is [name], MD ? ?Referring MD/NP/PA: Dr Almyra Free ? ?Patient coming from: Home ? ?Chief Complaint  ?Patient presents with  ? Urinary Tract Infection  ?  ? ? HPI:  ? ? [name]  is a 81 y.o. male, with medical history significant of stage IV metastatic bladder cancer, status post radiation, chemotherapy, status post post ilial conduit, chronic recurrent UTIs, chronic pain, BPH, GERD, patient presents to ED secondary to complaints of left flank pain x1 day, patient was recently treated with urine infection about 2 weeks ago, where he was completing his oral antibiotics at home, reports he started to feel better, but he does report worsening left flank pain, he had fever 2 weeks ago, but he denies any fever currently, but he reports nausea, generalized weakness, fatigue, he denies any vomiting or diarrhea, no chest pain or shortness of breath ?- in ED his work-up significant for leukocytosis of 25 K, left flank pain, imaging significant for partial left kidney hydronephrosis, with pyelonephritis, noted on IV meropenem, given recent urine culture growing Enterobacter and Aerococcus, ED physician discussed with urology who recommended admission, n.p.o. after midnight for possible intervention (nephrostomy) tomorrow, [facility] hospitalist consulted to admit. ? ? ? Review of systems:  ?  ? ?A full 10 point Review of Systems was done, except as stated above, all other Review of Systems were negative. ? ? ?With Past History of the following :  ? ? ?Past Medical History:  ?Diagnosis Date  ? Bladder cancer ([place])   ? BPH (benign prostatic hypertrophy)   ? Dysuria   ? GERD (gastroesophageal reflux disease)   ? History of adenomatous polyp of colon    ? tubular adenoma 06/ 2018  ? History of bladder cancer 12/2014  ? urologist-  dr [name]--  dx High Grade TCC without stromal invasion s/p TURBT and intravesical BCG tx/  recurrent 07/2015 (pTa) TCC  repear intravesical BCG tx   ? History of hiatal hernia   ? History of urethral stricture   ? Nocturia   ?   ? ?Past Surgical History:  ?Procedure Laterality Date  ? CARDIAC CATHETERIZATION  1997  ? normal coronary arteries  ? CARDIOVASCULAR STRESS TEST  12-14-2005  ? Low risk perfusion study/  very small scar in the inferior septum from mid ventricle to apex,  no ischemia/  mild inferior septal hypokinesis/  ef 58%  ? CATARACT EXTRACTION W/ INTRAOCULAR LENS  IMPLANT, BILATERAL  2011  ? COLONOSCOPY  last one 06/ 2018  ? CYSTOSCOPY WITH BIOPSY N/A 08/12/2015  ? Procedure: CYSTOSCOPY WITH BIOPSY AND FULGERATION;  Surgeon: [name], MD;  [facility];  Service: Urology;  Laterality:  N/A;  ? CYSTOSCOPY WITH INJECTION N/A 06/29/2017  ? Procedure: CYSTOSCOPY WITH INJECTION OF INDOCYANINE GREEN DYE;  Surgeon: Alexis Frock, MD;  Location: [facility];  Service: Urology;  Laterality: N/A;  ? CYSTOSCOPY WITH URETHRAL DILATATION N/A 03/21/2017  ? Procedure: CYSTOSCOPY;  Surgeon: [name], MD;  Location: [facility];  Service: Urology;  Laterality: N/A;  ? ESOPHAGOGASTRODUODENOSCOPY  12-05-2014  ? IR IMAGING GUIDED PORT INSERTION  02/07/2018  ? IR REMOVAL TUN ACCESS W/ PORT W/O FL MOD SED  04/14/2018  ? TRANSTHORACIC ECHOCARDIOGRAM  01-31-2008  ? nomral LVF,  ef 55-65%/  mild pulmonic stenosis without regurg.,  peak transpulomonic valve gradient 10mHg  ? TRANSURETHRAL RESECTION OF BLADDER TUMOR N/A 12/31/2014  ? Procedure: TRANSURETHRAL RESECTION OF BLADDER TUMOR (TURBT);  Surgeon: MLowella Bandy MD;  [facility]  Service: Urology;  Laterality: N/A;  ? TRANSURETHRAL RESECTION OF BLADDER TUMOR N/A 03/21/2017  ? Procedure: POSSIBLE TRANSURETHRAL RESECTION OF  BLADDER TUMOR (TURBT);  Surgeon: BNickie Retort MD;  [facility]  Service: Urology;  Laterality: N/A;  ? ? ? ? Social History:  ? ?  ?Social History  ? ?Tobacco Use  ? Smoking status: Former  ?  Packs/day: 1.00  ?  Years: 34.00  ?  Pack years: 34.00  ?  Types: Cigarettes  ?  Quit date: 12/26/1988  ?  Years since quitting: 33.0  ? Smokeless tobacco: Never  ?Substance Use Topics  ? Alcohol use: Yes  ?  Alcohol/week: 6.0 standard drinks  ?  Types: 6 Cans of beer per week  ?  Comment: BEER  ?  ? ? ? ? Family History :  ? ?  ?Family History  ?Problem Relation Age of Onset  ? Alcoholism Father   ? Colon cancer Neg Hx   ? Colon polyps Neg Hx   ? Diabetes Neg Hx   ? Kidney disease Neg Hx   ? Esophageal cancer Neg Hx   ? Heart disease Neg Hx   ? Gallbladder disease Neg Hx   ? Allergic rhinitis Neg Hx   ? Angioedema Neg Hx   ? Asthma Neg Hx   ? Atopy Neg Hx   ? Eczema Neg Hx   ? Immunodeficiency Neg Hx   ? Urticaria Neg Hx   ? ? ? ? Home Medications:  ? ?Prior to Admission medications   ?Medication Sig Start Date End Date Taking? Authorizing Provider  ?acetaminophen (TYLENOL) 500 MG tablet Take 500 mg by mouth every 6 (six) hours as needed.   Yes [provider]  ?amoxicillin-clavulanate (AUGMENTIN) 875-125 MG tablet Take 1 tablet by mouth 2 (two) times daily.   Yes [provider]  ?clotrimazole-betamethasone (LOTRISONE) cream Apply 1 application topically in the morning and at bedtime. 06/27/20  Yes [provider]  ?HYDROmorphone (DILAUDID) 4 MG tablet Take 1 tablet (4 mg total) by mouth every 4 (four) hours as needed for severe pain. 12/15/21  Yes SWyatt Portela MD  ?lactose free nutrition (BOOST) LIQD Take 237 mLs by mouth 3 (three) times daily between meals.    Yes [provider]  ?levothyroxine (SYNTHROID) 50 MCG tablet Take 50 mcg by mouth daily. 10/01/21  Yes [provider]  ?oxyCODONE (OXY IR/ROXICODONE) 5 MG immediate release tablet Take  1-2 tablets (5-10 mg total) by mouth every 4 (four) hours as needed for moderate pain or severe pain. 12/14/21  Yes SWyatt Portela MD  ?polyethylene glycol ([facility]/  GLYCOLAX) 17 g packet Take 17 g by mouth daily as needed for mild constipation. 07/18/20  Yes Roxan Hockey, MD  ? ? ? Allergies:  ? ?  ?Allergies  ?Allergen Reactions  ? Ciprofloxacin Hives  ? Cefepime Rash  ? Zofran [Ondansetron] Rash  ? ? ? Physical Exam:  ? ?Vitals ? ?Blood pressure (!) 134/118, pulse (!) 116, temperature 97.8 ?F (36.6 ?C), temperature source Oral, resp. rate 18, height '5\' 11"'$  (1.803 m), weight 93 kg, SpO2 97 %. ? ? ?1. General elderly male lying in bed in NAD,  ? ?2. Normal affect and insight, Not Suicidal or Homicidal, Awake Alert, Oriented X 3. ? ?3. No F.N deficits, ALL C.Nerves Intact, Strength 5/5 all 4 extremities, Sensation intact all 4 extremities, Plantars down going. ? ?4. Ears and Eyes appear Normal, Conjunctivae clear, PERRLA. Moist Oral Mucosa. ? ?5. Supple Neck, No JVD, No cervical lymphadenopathy appriciated, No Carotid Bruits. ? ?6. Symmetrical Chest wall movement, Good air movement bilaterally, CTAB. ? ?7. RRR, No Gallops, Rubs or Murmurs, No Parasternal Heave. ? ?8. Positive Bowel Sounds, Abdomen Soft, No tenderness, No organomegaly appriciated,No rebound -guarding or rigidity.  Patient has right lower abdomen urostomy bag/ileal conduit bag, has left CVA ? ?9.  No Cyanosis, Normal Skin Turgor, No Skin Rash or Bruise. ? ?10. Good muscle tone,  joints appear normal , no effusions, Normal ROM. ? ? ? ? Data Review:  ? ? CBC ?Recent Labs  ?Lab 12/23/21 ?1737  ?WBC 25.0*  ?HGB 15.5  ?HCT 48.2  ?PLT 224  ?MCV 106.9*  ?MCH 34.4*  ?MCHC 32.2  ?RDW 13.2  ? ?------------------------------------------------------------------------------------------------------------------ ? ?Chemistries  ?Recent Labs  ?Lab 12/23/21 ?1737  ?NA 139  ?K 4.5  ?CL 104  ?CO2 24  ?GLUCOSE 127*  ?BUN 16  ?CREATININE 1.17  ?CALCIUM 9.0   ? ?------------------------------------------------------------------------------------------------------------------ ?estimated creatinine clearance is 58.7 mL/min (by C-G formula based on SCr of 1.17 mg/dL). ?---

## 2021-12-23 NOTE — ED Provider Triage Note (Signed)
Emergency Medicine Provider Triage Evaluation Note ? ?[name] , a 81 y.o. male  was evaluated in triage.  Pt complains of left flank pain ongoing for 1 day.  Has had a recent urinary tract infection is a course of antibiotics.  He was febrile about 2 weeks ago with no recent fevers per family.  Positive nausea no diarrhea no vomiting.. ? ?Review of Systems  ?Positive: Vomiting.  No fevers.  No headache chest pain abdominal pain no cough. ? ? ?Physical Exam  ?BP 133/81 (BP Location: Right Arm)   Pulse 89   Temp 97.8 ?F (36.6 ?C) (Oral)   Resp 16   Ht '5\' 11"'$  (1.803 m)   Wt 93 kg   SpO2 98%   BMI 28.59 kg/m?  ?Gen:   Awake, no distress   ?Resp:  Normal effort  ?MSK:   Moves extremities without difficulty  ?Other:  Positive left-sided CVA tenderness. ? ?Medical Decision Making  ?Medically screening exam initiated at 6:19 PM.  Appropriate orders placed.  JOHNIE MAKKI was informed that the remainder of the evaluation will be completed by another provider, this initial triage assessment does not replace that evaluation, and the importance of remaining in the ED until their evaluation is complete. ? ? ?  ?Luna Fuse, MD ?12/23/21 1820 ? ?

## 2021-12-23 NOTE — Progress Notes (Signed)
Pharmacy Antibiotic Note ? ?[name] is a 81 y.o. male admitted on 12/23/2021 with UTI.  Pharmacy has been consulted for merrem dosing. ? ?Plan: ?Merrem 1 gram iv q8h ? ?Height: '5\' 11"'$  (180.3 cm) ?Weight: 93 kg (205 lb) ?IBW/kg (Calculated) : 75.3 ? ?Temp (24hrs), Avg:97.8 ?F (36.6 ?C), Min:97.8 ?F (36.6 ?C), Max:97.8 ?F (36.6 ?C) ? ?Recent Labs  ?Lab 12/23/21 ?1737  ?WBC 25.0*  ?CREATININE 1.17  ?  ?Estimated Creatinine Clearance: 58.7 mL/min (by C-G formula based on SCr of 1.17 mg/dL).   ? ?Allergies  ?Allergen Reactions  ? Ciprofloxacin Hives  ? Cefepime Rash  ? Zofran [Ondansetron] Rash  ? ? ?Antimicrobials this admission: ?5/10 merrem >>  ? ? ?Thank you for allowing pharmacy to be a part of this patient?s care. ? ?Zynia Wojtowicz BS, PharmD, BCPS ?Clinical Pharmacist ?12/23/2021 8:13 PM ? ?Contact: [phone] after 3 PM ? ?"Be curious, not judgmental..." -Cyndia Diver ?

## 2021-12-23 NOTE — ED Provider Notes (Signed)
?[place] ?Provider Note ? ? ?CSN: [id number] ?Arrival date & time: 12/23/21  1527 ? ?  ? ?History ? ?Chief Complaint  ?Patient presents with  ? Urinary Tract Infection  ? ? ?[name] is a 81 y.o. male. ? ?With history of bladder cancer status post ileal conduit, ongoing radiation therapy, presents with left flank pain x1 day.  Recently was treated for urinary tract infection about 2 weeks ago diagnosed with pyelo-.  Completing oral antibiotics at home and has been doing well for about 2 days now until his left flank pain returned.  He had fevers 2 weeks ago but denies any fevers today or yesterday.  Denies any headache or chest pain or abdominal pain.  Denies vomiting or diarrhea. ? ? ?  ? ?Home Medications ?Prior to Admission medications   ?Medication Sig Start Date End Date Taking? Authorizing Provider  ?acetaminophen (TYLENOL) 500 MG tablet Take 500 mg by mouth every 6 (six) hours as needed.   Yes [provider]  ?amoxicillin-clavulanate (AUGMENTIN) 875-125 MG tablet Take 1 tablet by mouth 2 (two) times daily.   Yes [provider]  ?clotrimazole-betamethasone (LOTRISONE) cream Apply 1 application topically in the morning and at bedtime. 06/27/20  Yes [provider]  ?HYDROmorphone (DILAUDID) 4 MG tablet Take 1 tablet (4 mg total) by mouth every 4 (four) hours as needed for severe pain. 12/15/21  Yes [name], MD  ?lactose free nutrition (BOOST) LIQD Take 237 mLs by mouth 3 (three) times daily between meals.    Yes [provider]  ?levothyroxine (SYNTHROID) 50 MCG tablet Take 50 mcg by mouth daily. 10/01/21  Yes [provider]  ?oxyCODONE (OXY IR/ROXICODONE) 5 MG immediate release tablet Take 1-2 tablets (5-10 mg total) by mouth every 4 (four) hours as needed for moderate pain or severe pain. 12/14/21  Yes [name], MD  ?polyethylene glycol (MIRALAX / GLYCOLAX) 17 g packet Take 17 g by mouth daily as needed for mild  constipation. 07/18/20  Yes Roxan Hockey, MD  ?   ? ?Allergies    ?Ciprofloxacin, Cefepime, and Zofran [ondansetron]   ? ?Review of Systems   ?Review of Systems  ?Constitutional:  Negative for fever.  ?HENT:  Negative for ear pain and sore throat.   ?Eyes:  Negative for pain.  ?Respiratory:  Negative for cough.   ?Cardiovascular:  Negative for chest pain.  ?Gastrointestinal:  Negative for abdominal pain.  ?Genitourinary:  Positive for flank pain.  ?Musculoskeletal:  Negative for back pain.  ?Skin:  Negative for color change and rash.  ?Neurological:  Negative for syncope.  ?All other systems reviewed and are negative. ? ?Physical Exam ?Updated Vital Signs ?BP (!) 134/118   Pulse (!) 116   Temp 97.8 ?F (36.6 ?C) (Oral)   Resp 18   Ht '5\' 11"'$  (1.803 m)   Wt 93 kg   SpO2 97%   BMI 28.59 kg/m?  ?Physical Exam ?Constitutional:   ?   Appearance: He is well-developed.  ?HENT:  ?   Head: Normocephalic.  ?   Nose: Nose normal.  ?Eyes:  ?   Extraocular Movements: Extraocular movements intact.  ?Cardiovascular:  ?   Rate and Rhythm: Normal rate.  ?Pulmonary:  ?   Effort: Pulmonary effort is normal.  ?Abdominal:  ?   Tenderness: There is left CVA tenderness.  ?Skin: ?   Coloration: Skin is not jaundiced.  ?Neurological:  ?   Mental Status: He is alert. Mental  status is at baseline.  ? ? ?ED Results / Procedures / Treatments   ?Labs ?(all labs ordered are listed, but only abnormal results are displayed) ?Labs Reviewed  ?URINALYSIS, ROUTINE W REFLEX MICROSCOPIC - Abnormal; Notable for the following components:  ?    Result Value  ? Color, Urine STRAW (*)   ? Hgb urine dipstick LARGE (*)   ? Protein, ur 100 (*)   ? Nitrite POSITIVE (*)   ? Leukocytes,Ua LARGE (*)   ? All other components within normal limits  ?CBC - Abnormal; Notable for the following components:  ? WBC 25.0 (*)   ? MCV 106.9 (*)   ? MCH 34.4 (*)   ? All other components within normal limits  ?BASIC METABOLIC PANEL - Abnormal; Notable for the following  components:  ? Glucose, Bld 127 (*)   ? All other components within normal limits  ?URINALYSIS, MICROSCOPIC (REFLEX) - Abnormal; Notable for the following components:  ? Bacteria, UA FEW (*)   ? All other components within normal limits  ?CULTURE, BLOOD (ROUTINE X 2)  ?CULTURE, BLOOD (ROUTINE X 2)  ?LACTIC ACID, PLASMA  ?LACTIC ACID, PLASMA  ? ? ?EKG ?None ? ?Radiology ?CT ABDOMEN PELVIS WO CONTRAST ? ?Result Date: 12/23/2021 ?CLINICAL DATA:  Left flank pain. EXAM: CT ABDOMEN AND PELVIS WITHOUT CONTRAST TECHNIQUE: Multidetector CT imaging of the abdomen and pelvis was performed following the standard protocol without IV contrast. RADIATION DOSE REDUCTION: This exam was performed according to the departmental dose-optimization program which includes automated exposure control, adjustment of the mA and/or kV according to patient size and/or use of iterative reconstruction technique. COMPARISON:  December 07, 2021 FINDINGS: Lower chest: Mild atelectasis is seen along the posterior aspect of the right lung base. Small bilateral pleural effusions are seen. Hepatobiliary: Stable 0.5 cm, 1.4 cm, 1.5 cm and 2.3 cm foci of well-defined foci of low attenuation are seen within the right lobe of the liver. No gallstones, gallbladder wall thickening, or biliary dilatation. Pancreas: Unremarkable. No pancreatic ductal dilatation or surrounding inflammatory changes. Spleen: Normal in size without focal abnormality. Adrenals/Urinary Tract: Adrenal glands are unremarkable. Kidneys are normal in size, without focal lesions. Marked severity left-sided hydronephrosis and hydroureter are seen. This extends to the level of the right lower quadrant of the abdomen and is increased in severity when compared to the prior study. Mild to moderate severity left-sided perinephric inflammatory fat stranding is also noted. The urinary bladder is surgically absent. Stomach/Bowel: There is a small, stable hiatal hernia. Appendix appears normal.  Surgically anastomosed bowel is seen within the right lower quadrant. Stool is seen throughout the large bowel. No evidence of bowel wall thickening, distention, or inflammatory changes. Noninflamed diverticula are noted throughout the sigmoid colon. Vascular/Lymphatic: Aortic atherosclerosis. No enlarged abdominal or pelvic lymph nodes. Reproductive: The prostate gland is surgically absent. Other: There is a right lower quadrant ostomy site. No abdominopelvic ascites. Musculoskeletal: A stable expansile lesion is seen involving the right inferior pubic ramus. Degenerative changes are seen throughout the lumbar spine. IMPRESSION: 1. Findings, as described above, suggestive of partial obstruction of the left kidney. Sequelae associated with left-sided acute pyelonephritis cannot be excluded. Correlation with urinalysis is recommended. 2. Small bilateral pleural effusions. 3. Evidence of prior cystoprostatectomy and ileal conduit formation. 4. Sigmoid diverticulosis. 5. Small, stable hiatal hernia. 6. Stable hepatic cysts and/or hemangiomas. Aortic Atherosclerosis (ICD10-I70.0). Electronically Signed   By: Virgina Norfolk M.D.   On: 12/23/2021 19:14   ? ?Procedures ?Procedures  ? ? ?  Medications Ordered in ED ?Medications  ?meropenem (MERREM) 1 g in sodium chloride 0.9 % 100 mL IVPB (1 g Intravenous New Bag/Given 12/23/21 2103)  ? ? ?ED Course/ Medical Decision Making/ A&P ?  ?                        ?Medical Decision Making ?Amount and/or Complexity of Data Reviewed ?Labs: ordered. ?Radiology: ordered. ? ? ?History from wife at bedside. ? ?Cardiac monitoring showing sinus rhythm. ? ?Chart review shows prior urinary tract infection pyelonephritis approximately 2 weeks ago. ? ?Diagnosis that includes labs White count is 25 hemoglobin 15 chemistry unremarkable.  Urinalysis positive for UTI.  Blood cultures were sent urine cultures were sent lactic acid ordered as well. ? ?Patient started on meropenem due to penicillin  allergy and based on prior urine culture sensitivities. ? ?Case discussed with on-call urology, plan for possible nephrostomy tube tomorrow.  Patient admitted to the hospitalist team. ? ? ? ? ? ? ? ? ?Final Clinical Impression(s)

## 2021-12-24 DIAGNOSIS — Z8551 Personal history of malignant neoplasm of bladder: Secondary | ICD-10-CM

## 2021-12-24 DIAGNOSIS — R109 Unspecified abdominal pain: Secondary | ICD-10-CM | POA: Diagnosis not present

## 2021-12-24 DIAGNOSIS — C679 Malignant neoplasm of bladder, unspecified: Secondary | ICD-10-CM | POA: Diagnosis not present

## 2021-12-24 DIAGNOSIS — N12 Tubulo-interstitial nephritis, not specified as acute or chronic: Secondary | ICD-10-CM | POA: Diagnosis not present

## 2021-12-24 DIAGNOSIS — J449 Chronic obstructive pulmonary disease, unspecified: Secondary | ICD-10-CM | POA: Diagnosis not present

## 2021-12-24 DIAGNOSIS — N1330 Unspecified hydronephrosis: Secondary | ICD-10-CM

## 2021-12-24 LAB — PROTIME-INR
INR: 1.2 (ref 0.8–1.2)
Prothrombin Time: 14.7 seconds (ref 11.4–15.2)

## 2021-12-24 LAB — CBC
HCT: 40.4 % (ref 39.0–52.0)
Hemoglobin: 13.7 g/dL (ref 13.0–17.0)
MCH: 35.9 pg — ABNORMAL HIGH (ref 26.0–34.0)
MCHC: 33.9 g/dL (ref 30.0–36.0)
MCV: 105.8 fL — ABNORMAL HIGH (ref 80.0–100.0)
Platelets: 178 10*3/uL (ref 150–400)
RBC: 3.82 MIL/uL — ABNORMAL LOW (ref 4.22–5.81)
RDW: 13.3 % (ref 11.5–15.5)
WBC: 20.8 10*3/uL — ABNORMAL HIGH (ref 4.0–10.5)
nRBC: 0 % (ref 0.0–0.2)

## 2021-12-24 LAB — BASIC METABOLIC PANEL
Anion gap: 7 (ref 5–15)
BUN: 15 mg/dL (ref 8–23)
CO2: 25 mmol/L (ref 22–32)
Calcium: 8.6 mg/dL — ABNORMAL LOW (ref 8.9–10.3)
Chloride: 108 mmol/L (ref 98–111)
Creatinine, Ser: 1.1 mg/dL (ref 0.61–1.24)
GFR, Estimated: >60 mL/min (ref >60)
Glucose, Bld: 133 mg/dL — ABNORMAL HIGH (ref 70–99)
Potassium: 4.5 mmol/L (ref 3.5–5.1)
Sodium: 140 mmol/L (ref 135–145)

## 2021-12-24 LAB — LACTIC ACID, PLASMA: Lactic Acid, Venous: 2 mmol/L (ref 0.5–1.9)

## 2021-12-24 MED ORDER — VANCOMYCIN HCL 1250 MG/250ML IV SOLN: 1250.0000 mg | INTRAVENOUS | Status: DC

## 2021-12-24 MED ORDER — VANCOMYCIN HCL 1250 MG/250ML IV SOLN
1250.0000 mg | INTRAVENOUS | Status: AC
  Administered 2021-12-25: 1250 mg via INTRAVENOUS
  Filled 2021-12-24 (×4): qty 250

## 2021-12-24 MED ORDER — ENOXAPARIN SODIUM 40 MG/0.4ML IJ SOSY
40.0000 mg | PREFILLED_SYRINGE | INTRAMUSCULAR | Status: DC
  Administered 2021-12-25: 40 mg via SUBCUTANEOUS
  Filled 2021-12-24: qty 0.4

## 2021-12-24 MED ORDER — SODIUM CHLORIDE 0.9 % IV SOLN
500.0000 mg | Freq: Three times a day (TID) | INTRAVENOUS | Status: DC
  Administered 2021-12-24 – 2021-12-26 (×6): 500 mg via INTRAVENOUS
  Filled 2021-12-24 (×10): qty 10

## 2021-12-24 NOTE — Consult Note (Signed)
Urology Consult  ?Referring physician: Dr. Roderic Palau ?Reason for referral: left hydronephrosis ? ?Chief Complaint: Left flank pain ? ?History of Present Illness: Mr Pignato is a 81yo with a history of bladder cancer s/p radical cystectomy, chemotherapy and radiation in 2018. He presented to the ER last night with worsening left flank pain that has been present for over 1 week. He was seen 12/07/2021 for left flank and abdominal pain and was diagnosed with pyelonephritis. He finishes his course of antibiotics but his flank pain continued to worsen. No fevers at home. The flank pain is sharp, constant, moderate to severe and nonraditing. He has associated nausea but no vomiting. No hematuria. No other associated symptoms. Ct from the ER shows severe left hydronephrosis which is worse from a previous CT on 07/03/2021.  ? ?Past Medical History:  ?Diagnosis Date  ? Bladder cancer ([place])   ? BPH (benign prostatic hypertrophy)   ? Dysuria   ? GERD (gastroesophageal reflux disease)   ? History of adenomatous polyp of colon   ? tubular adenoma 06/ 2018  ? History of bladder cancer 12/2014  ? urologist-  dr [name]--  dx High Grade TCC without stromal invasion s/p TURBT and intravesical BCG tx/  recurrent 07/2015 (pTa) TCC  repear intravesical BCG tx   ? History of hiatal hernia   ? History of urethral stricture   ? Nocturia   ? ?Past Surgical History:  ?Procedure Laterality Date  ? CARDIAC CATHETERIZATION  1997  ? normal coronary arteries  ? CARDIOVASCULAR STRESS TEST  12-14-2005  ? Low risk perfusion study/  very small scar in the inferior septum from mid ventricle to apex,  no ischemia/  mild inferior septal hypokinesis/  ef 58%  ? CATARACT EXTRACTION W/ INTRAOCULAR LENS  IMPLANT, BILATERAL  2011  ? COLONOSCOPY  last one 06/ 2018  ? CYSTOSCOPY WITH BIOPSY N/A 08/12/2015  ? Procedure: CYSTOSCOPY WITH BIOPSY AND FULGERATION;  Surgeon: [name], MD;  Location[facility];  Service: Urology;  Laterality: N/A;   ? CYSTOSCOPY WITH INJECTION N/A 06/29/2017  ? Procedure: CYSTOSCOPY WITH INJECTION OF INDOCYANINE GREEN DYE;  Surgeon: Alexis Frock, MD;  Location: [facility];  Service: Urology;  Laterality: N/A;  ? CYSTOSCOPY WITH URETHRAL DILATATION N/A 03/21/2017  ? Procedure: CYSTOSCOPY;  Surgeon: [name], MD;  Location: [facility];  Service: Urology;  Laterality: N/A;  ? ESOPHAGOGASTRODUODENOSCOPY  12-05-2014  ? IR IMAGING GUIDED PORT INSERTION  02/07/2018  ? IR REMOVAL TUN ACCESS W/ PORT W/O FL MOD SED  04/14/2018  ? TRANSTHORACIC ECHOCARDIOGRAM  01-31-2008  ? nomral LVF,  ef 55-65%/  mild pulmonic stenosis without regurg.,  peak transpulomonic valve gradient 58mHg  ? TRANSURETHRAL RESECTION OF BLADDER TUMOR N/A 12/31/2014  ? Procedure: TRANSURETHRAL RESECTION OF BLADDER TUMOR (TURBT);  Surgeon: MLowella Bandy MD;  [facility]  Service: Urology;  Laterality: N/A;  ? TRANSURETHRAL RESECTION OF BLADDER TUMOR N/A 03/21/2017  ? Procedure: POSSIBLE TRANSURETHRAL RESECTION OF BLADDER TUMOR (TURBT);  Surgeon: BNickie Retort MD;  [facility]  Service: Urology;  Laterality: N/A;  ? ? ?Medications: I have reviewed the patient's current medications. ?Allergies:  ?Allergies  ?Allergen Reactions  ? Ciprofloxacin Hives  ? Cefepime Rash  ? Zofran [Ondansetron] Rash  ? ? ?Family History  ?Problem Relation Age of Onset  ? Alcoholism Father   ? Colon cancer Neg Hx   ? Colon polyps Neg Hx   ? Diabetes Neg  Hx   ? Kidney disease Neg Hx   ? Esophageal cancer Neg Hx   ? Heart disease Neg Hx   ? Gallbladder disease Neg Hx   ? Allergic rhinitis Neg Hx   ? Angioedema Neg Hx   ? Asthma Neg Hx   ? Atopy Neg Hx   ? Eczema Neg Hx   ? Immunodeficiency Neg Hx   ? Urticaria Neg Hx   ? ?Social History:  reports that he quit smoking about 33 years ago. His smoking use included cigarettes. He has a 34.00 pack-year smoking history. He has never used smokeless tobacco. He reports current  alcohol use of about 6.0 standard drinks per week. He reports that he does not use drugs. ? ?Review of Systems  ?Genitourinary:  Positive for flank pain.  ?All other systems reviewed and are negative. ? ?Physical Exam:  ?Vital signs in last 24 hours: ?Temp:  [97.8 ?F (36.6 ?C)-98.4 ?F (36.9 ?C)] 98.2 ?F (36.8 ?C) (05/11 1302) ?Pulse Rate:  [81-119] 81 (05/11 1302) ?Resp:  [16-20] 16 (05/11 1302) ?BP: (100-134)/(63-118) 111/81 (05/11 1302) ?SpO2:  [92 %-98 %] 93 % (05/11 1302) ?Weight:  [92.6 kg-93 kg] 92.6 kg (05/10 2310) ?Physical Exam ?Constitutional:   ?   Appearance: Normal appearance.  ?HENT:  ?   Head: Normocephalic and atraumatic.  ?   Nose: Nose normal. No congestion.  ?   Mouth/Throat:  ?   Mouth: Mucous membranes are dry.  ?Eyes:  ?   Extraocular Movements: Extraocular movements intact.  ?   Pupils: Pupils are equal, round, and reactive to light.  ?Cardiovascular:  ?   Rate and Rhythm: Normal rate and regular rhythm.  ?Pulmonary:  ?   Effort: Pulmonary effort is normal. No respiratory distress.  ?Abdominal:  ?   General: Abdomen is flat. There is no distension.  ?Musculoskeletal:     ?   General: No swelling. Normal range of motion.  ?   Cervical back: Normal range of motion and neck supple.  ?Skin: ?   General: Skin is warm and dry.  ?Neurological:  ?   General: No focal deficit present.  ?   Mental Status: He is alert and oriented to person, place, and time.  ?Psychiatric:     ?   Mood and Affect: Mood normal.     ?   Behavior: Behavior normal.     ?   Thought Content: Thought content normal.     ?   Judgment: Judgment normal.  ? ? ?Laboratory Data:  ?Results for orders placed or performed during the hospital encounter of 12/23/21 (from the past 72 hour(s))  ?Urinalysis, Routine w reflex microscopic     Status: Abnormal  ? Collection Time: 12/23/21  4:39 PM  ?Result Value Ref Range  ? Color, Urine STRAW (A) YELLOW  ? APPearance CLEAR CLEAR  ? Specific Gravity, Urine 1.015 1.005 - 1.030  ? pH 6.5 5.0 -  8.0  ? Glucose, UA NEGATIVE NEGATIVE mg/dL  ? Hgb urine dipstick LARGE (A) NEGATIVE  ? Bilirubin Urine NEGATIVE NEGATIVE  ? Ketones, ur NEGATIVE NEGATIVE mg/dL  ? Protein, ur 100 (A) NEGATIVE mg/dL  ? Nitrite POSITIVE (A) NEGATIVE  ? Leukocytes,Ua LARGE (A) NEGATIVE  ?  Comment: Performed at [facility], [address] [place], [place] [address]  ?Urinalysis, Microscopic (reflex)     Status: Abnormal  ? Collection Time: 12/23/21  4:39 PM  ?Result Value Ref Range  ? RBC / HPF 0-5 0 -  5 RBC/hpf  ? WBC, UA 6-10 0 - 5 WBC/hpf  ? Bacteria, UA FEW (A) NONE SEEN  ? Squamous Epithelial / LPF 0-5 0 - 5  ?  Comment: Performed at [facility], [address] [place], [place] [address]  ?CBC     Status: Abnormal  ? Collection Time: 12/23/21  5:37 PM  ?Result Value Ref Range  ? WBC 25.0 (H) 4.0 - 10.5 K/uL  ? RBC 4.51 4.22 - 5.81 MIL/uL  ? Hemoglobin 15.5 13.0 - 17.0 g/dL  ? HCT 48.2 39.0 - 52.0 %  ? MCV 106.9 (H) 80.0 - 100.0 fL  ? MCH 34.4 (H) 26.0 - 34.0 pg  ? MCHC 32.2 30.0 - 36.0 g/dL  ? RDW 13.2 11.5 - 15.5 %  ? Platelets 224 150 - 400 K/uL  ? nRBC 0.0 0.0 - 0.2 %  ?  Comment: Performed at [facility], [address], [place], [place] [address]  ?Basic metabolic panel     Status: Abnormal  ? Collection Time: 12/23/21  5:37 PM  ?Result Value Ref Range  ? Sodium 139 135 - 145 mmol/L  ? Potassium 4.5 3.5 - 5.1 mmol/L  ? Chloride 104 98 - 111 mmol/L  ? CO2 24 22 - 32 mmol/L  ? Glucose, Bld 127 (H) 70 - 99 mg/dL  ?  Comment: Glucose reference range applies only to samples taken after fasting for at least 8 hours.  ? BUN 16 8 - 23 mg/dL  ? Creatinine, Ser 1.17 0.61 - 1.24 mg/dL  ? Calcium 9.0 8.9 - 10.3 mg/dL  ? GFR, Estimated >60 >60 mL/min  ?  Comment: (NOTE) ?Calculated using the CKD-EPI Creatinine Equation (2021) ?  ? Anion gap 11 5 - 15  ?  Comment: Performed at [facility], [address], [place], [place] [address]  ?Culture, blood (routine x 2)     Status: None (Preliminary result)  ? Collection Time: 12/23/21  8:39 PM   ? Specimen: BLOOD LEFT HAND  ?Result Value Ref Range  ? Specimen Description BLOOD LEFT HAND   ? Special Requests    ?  BOTTLES DRAWN AEROBIC ONLY Blood Culture adequate volume  ? Culture    ?  NO GROWT

## 2021-12-24 NOTE — Progress Notes (Signed)
[facility] was set up for pt to go to [facility] IR tomorrow by 0900.  ?

## 2021-12-24 NOTE — Progress Notes (Signed)
?PROGRESS NOTE ? ? ? ?[name]  FUX:[id number] DOB: 06-Nov-1940 DOA: 12/23/2021 ?PCP: [name], MD  ? ? ?Brief Narrative:  ?81 year old male with a history of bladder cancer status post cystectomy/ileal conduit, recurrent urinary tract infections, admitted with left flank pain.  Found to have pyelonephritis and obstructive uropathy with hydronephrosis.  Started on intravenous antibiotics.  Seen by urology with recommendations for percutaneous nephrostomy.  Interventional radiology following and plans for nephrostomy placement on 5/12. ? ? ?Assessment & Plan: ?  ?Principal Problem: ?  Pyelonephritis ?Active Problems: ?  Malignant neoplasm of urinary bladder ([place]) ?  Bladder cancer ([place]) ?  Status post ileal conduit ([place]) ?  COPD (chronic obstructive pulmonary disease) ([place]) ? ? ?Left pyelonephritis with obstructive uropathy and hydronephrosis ?-Currently on intravenous antibiotics ?-Follow-up urine culture ?-Seen by urology with recommendations for percutaneous nephrostomy ?-Interventional radiology has evaluated patient and plans are for nephrostomy placement on 5/12 ? ?History of malignant neoplasm of urinary bladder ?-Status post radical cystectomy ?-Continue follow-up with urology and oncology ? ?COPD ?-No shortness of breath at this time ?-Continue as needed bronchodilators ? ?Hypothyroidism ?-Continue Synthroid ? ? ?DVT prophylaxis: enoxaparin (LOVENOX) injection 40 mg Start: 12/25/21 2200 ?SCDs Start: 12/23/21 2313 ? ?Code Status: Full code ?Family Communication: Discussed with his wife at the bedside ?Disposition Plan: Status is: Inpatient ?Remains inpatient appropriate because: Needs further management of hydronephrosis with percutaneous nephrostomy ? ? ? ? ?Consultants:  ?Urology ?Interventional radiology ? ?Procedures:  ? ? ?Antimicrobials:  ?Imipenem 5/11 > ? ? ?Subjective: ?Overall reports that left flank pain is better today.  He has not had any vomiting. ? ?Objective: ?Vitals:  ? 12/24/21  0217 12/24/21 0554 12/24/21 1036 12/24/21 1302  ?BP: 102/63 100/64 129/76 111/81  ?Pulse: 86 85 81 81  ?Resp: '16 16 17 16  '$ ?Temp: 98.2 ?F (36.8 ?C) 98.1 ?F (36.7 ?C) 98.2 ?F (36.8 ?C) 98.2 ?F (36.8 ?C)  ?TempSrc: Oral Oral  Oral  ?SpO2: 97% 93% 96% 93%  ?Weight:      ?Height:      ? ? ?Intake/Output Summary (Last 24 hours) at 12/24/2021 2012 ?Last data filed at 12/24/2021 1833 ?Gross per 24 hour  ?Intake 1197.01 ml  ?Output 1650 ml  ?Net -452.99 ml  ? ?Filed Weights  ? 12/23/21 1635 12/23/21 2310  ?Weight: 93 kg 92.6 kg  ? ? ?Examination: ? ?General exam: Appears calm and comfortable  ?Respiratory system: Clear to auscultation. Respiratory effort normal. ?Cardiovascular system: S1 & S2 heard, RRR. No JVD, murmurs, rubs, gallops or clicks. No pedal edema. ?Gastrointestinal system: Abdomen is nondistended, soft and nontender. No organomegaly or masses felt. Normal bowel sounds heard.  Right lower abdomen urostomy bag ?Central nervous system: Alert and oriented. No focal neurological deficits. ?Extremities: Symmetric 5 x 5 power. ?Skin: No rashes, lesions or ulcers ?Psychiatry: Judgement and insight appear normal. Mood & affect appropriate.  ? ? ? ?Data Reviewed: I have personally reviewed following labs and imaging studies ? ?CBC: ?Recent Labs  ?Lab 12/23/21 ?1737 12/24/21 ?0002  ?WBC 25.0* 20.8*  ?HGB 15.5 13.7  ?HCT 48.2 40.4  ?MCV 106.9* 105.8*  ?PLT 224 178  ? ?Basic Metabolic Panel: ?Recent Labs  ?Lab 12/23/21 ?1737 12/24/21 ?0002  ?NA 139 140  ?K 4.5 4.5  ?CL 104 108  ?CO2 24 25  ?GLUCOSE 127* 133*  ?BUN 16 15  ?CREATININE 1.17 1.10  ?CALCIUM 9.0 8.6*  ? ?GFR: ?Estimated Creatinine Clearance: 62.3 mL/min (by C-G formula based on SCr of 1.1  mg/dL). ?Liver Function Tests: ?No results for input(s): AST, ALT, ALKPHOS, BILITOT, PROT, ALBUMIN in the last 168 hours. ?No results for input(s): LIPASE, AMYLASE in the last 168 hours. ?No results for input(s): AMMONIA in the last 168 hours. ?Coagulation Profile: ?Recent Labs   ?Lab 12/24/21 ?1559  ?INR 1.2  ? ?Cardiac Enzymes: ?No results for input(s): CKTOTAL, CKMB, CKMBINDEX, TROPONINI in the last 168 hours. ?BNP (last 3 results) ?No results for input(s): PROBNP in the last 8760 hours. ?HbA1C: ?No results for input(s): HGBA1C in the last 72 hours. ?CBG: ?No results for input(s): GLUCAP in the last 168 hours. ?Lipid Profile: ?No results for input(s): CHOL, HDL, LDLCALC, TRIG, CHOLHDL, LDLDIRECT in the last 72 hours. ?Thyroid Function Tests: ?No results for input(s): TSH, T4TOTAL, FREET4, T3FREE, THYROIDAB in the last 72 hours. ?Anemia Panel: ?No results for input(s): VITAMINB12, FOLATE, FERRITIN, TIBC, IRON, RETICCTPCT in the last 72 hours. ?Sepsis Labs: ?Recent Labs  ?Lab 12/23/21 ?2155 12/24/21 ?0002  ?LATICACIDVEN 1.8 2.0*  ? ? ?Recent Results (from the past 240 hour(s))  ?Culture, blood (routine x 2)     Status: None (Preliminary result)  ? Collection Time: 12/23/21  8:39 PM  ? Specimen: BLOOD LEFT HAND  ?Result Value Ref Range Status  ? Specimen Description BLOOD LEFT HAND  Final  ? Special Requests   Final  ?  BOTTLES DRAWN AEROBIC ONLY Blood Culture adequate volume  ? Culture   Final  ?  NO GROWTH < 12 HOURS ?Performed at [facility], [address] [place], [place] [address] ?  ? Report Status PENDING  Incomplete  ?Culture, blood (routine x 2)     Status: None (Preliminary result)  ? Collection Time: 12/23/21  8:39 PM  ? Specimen: BLOOD LEFT FOREARM  ?Result Value Ref Range Status  ? Specimen Description BLOOD LEFT FOREARM  Final  ? Special Requests   Final  ?  BOTTLES DRAWN AEROBIC ONLY Blood Culture results may not be optimal due to an inadequate volume of blood received in culture bottles  ? Culture   Final  ?  NO GROWTH < 12 HOURS ?Performed at [facility], [address] [place], [place] [address] ?  ? Report Status PENDING  Incomplete  ?  ? ? ? ? ? ?Radiology Studies: ?CT ABDOMEN PELVIS WO CONTRAST ? ?Result Date: 12/23/2021 ?CLINICAL DATA:  Left flank pain. EXAM: CT  ABDOMEN AND PELVIS WITHOUT CONTRAST TECHNIQUE: Multidetector CT imaging of the abdomen and pelvis was performed following the standard protocol without IV contrast. RADIATION DOSE REDUCTION: This exam was performed according to the departmental dose-optimization program which includes automated exposure control, adjustment of the mA and/or kV according to patient size and/or use of iterative reconstruction technique. COMPARISON:  December 07, 2021 FINDINGS: Lower chest: Mild atelectasis is seen along the posterior aspect of the right lung base. Small bilateral pleural effusions are seen. Hepatobiliary: Stable 0.5 cm, 1.4 cm, 1.5 cm and 2.3 cm foci of well-defined foci of low attenuation are seen within the right lobe of the liver. No gallstones, gallbladder wall thickening, or biliary dilatation. Pancreas: Unremarkable. No pancreatic ductal dilatation or surrounding inflammatory changes. Spleen: Normal in size without focal abnormality. Adrenals/Urinary Tract: Adrenal glands are unremarkable. Kidneys are normal in size, without focal lesions. Marked severity left-sided hydronephrosis and hydroureter are seen. This extends to the level of the right lower quadrant of the abdomen and is increased in severity when compared to the prior study. Mild to moderate severity left-sided perinephric inflammatory fat  stranding is also noted. The urinary bladder is surgically absent. Stomach/Bowel: There is a small, stable hiatal hernia. Appendix appears normal. Surgically anastomosed bowel is seen within the right lower quadrant. Stool is seen throughout the large bowel. No evidence of bowel wall thickening, distention, or inflammatory changes. Noninflamed diverticula are noted throughout the sigmoid colon. Vascular/Lymphatic: Aortic atherosclerosis. No enlarged abdominal or pelvic lymph nodes. Reproductive: The prostate gland is surgically absent. Other: There is a right lower quadrant ostomy site. No abdominopelvic ascites.  Musculoskeletal: A stable expansile lesion is seen involving the right inferior pubic ramus. Degenerative changes are seen throughout the lumbar spine. IMPRESSION: 1. Findings, as described above, suggestive of pa

## 2021-12-24 NOTE — Progress Notes (Signed)
? ?Chief Complaint: ?Patient was seen in consultation today for  ?Chief Complaint  ?Patient presents with  ? Urinary Tract Infection  ? at the request of [facility] ? ?Referring Physician(s): ?Nicolette Bang, MD ? ?Supervising Physician: Sandi Mariscal ? ?Patient Status: [facility]-Inpt ? ?History of Present Illness: ?[name] is a 81yo with a history of bladder cancer s/p radical cystectomy, chemotherapy and radiation in 2018. He presented to the ER last night with worsening left flank pain that has been present for over 1 week. He was seen 12/07/2021 for left flank and abdominal pain and was diagnosed with pyelonephritis. He finished his course of antibiotics but his flank pain continued to worsen. No fevers at home. The flank pain is sharp, constant, moderate to severe and nonraditing. He has associated nausea but no vomiting. No hematuria. No other associated symptoms. Ct from the ER shows severe left hydronephrosis which is worse from a previous CT on 07/03/2021.  IR asked for left PCN placement. ? ?Past Medical History:  ?Diagnosis Date  ? Bladder cancer ([place])   ? BPH (benign prostatic hypertrophy)   ? Dysuria   ? GERD (gastroesophageal reflux disease)   ? History of adenomatous polyp of colon   ? tubular adenoma 06/ 2018  ? History of bladder cancer 12/2014  ? urologist-  dr [name]--  dx High Grade TCC without stromal invasion s/p TURBT and intravesical BCG tx/  recurrent 07/2015 (pTa) TCC  repear intravesical BCG tx   ? History of hiatal hernia   ? History of urethral stricture   ? Nocturia   ? ? ?Past Surgical History:  ?Procedure Laterality Date  ? CARDIAC CATHETERIZATION  1997  ? normal coronary arteries  ? CARDIOVASCULAR STRESS TEST  12-14-2005  ? Low risk perfusion study/  very small scar in the inferior septum from mid ventricle to apex,  no ischemia/  mild inferior septal hypokinesis/  ef 58%  ? CATARACT EXTRACTION W/ INTRAOCULAR LENS  IMPLANT, BILATERAL  2011  ? COLONOSCOPY  last one 06/ 2018  ? CYSTOSCOPY WITH  BIOPSY N/A 08/12/2015  ? Procedure: CYSTOSCOPY WITH BIOPSY AND FULGERATION;  Surgeon: [name], MD;  Location: [facility];  Service: Urology;  Laterality: N/A;  ? CYSTOSCOPY WITH INJECTION N/A 06/29/2017  ? Procedure: CYSTOSCOPY WITH INJECTION OF INDOCYANINE GREEN DYE;  Surgeon: Alexis Frock, MD;  Location: [facility];  Service: Urology;  Laterality: N/A;  ? CYSTOSCOPY WITH URETHRAL DILATATION N/A 03/21/2017  ? Procedure: CYSTOSCOPY;  Surgeon: [name], MD;  Location: [facility];  Service: Urology;  Laterality: N/A;  ? ESOPHAGOGASTRODUODENOSCOPY  12-05-2014  ? IR IMAGING GUIDED PORT INSERTION  02/07/2018  ? IR REMOVAL TUN ACCESS W/ PORT W/O FL MOD SED  04/14/2018  ? TRANSTHORACIC ECHOCARDIOGRAM  01-31-2008  ? nomral LVF,  ef 55-65%/  mild pulmonic stenosis without regurg.,  peak transpulomonic valve gradient 68mHg  ? TRANSURETHRAL RESECTION OF BLADDER TUMOR N/A 12/31/2014  ? Procedure: TRANSURETHRAL RESECTION OF BLADDER TUMOR (TURBT);  Surgeon: MLowella Bandy MD;  [facility]  Service: Urology;  Laterality: N/A;  ? TRANSURETHRAL RESECTION OF BLADDER TUMOR N/A 03/21/2017  ? Procedure: POSSIBLE TRANSURETHRAL RESECTION OF BLADDER TUMOR (TURBT);  Surgeon: BNickie Retort MD;  [facility]  Service: Urology;  Laterality: N/A;  ? ? ?Allergies: ?Ciprofloxacin, Cefepime, and Zofran [ondansetron] ? ?Medications: ?Prior to Admission medications   ?Medication Sig Start Date End Date Taking? Authorizing Provider  ?acetaminophen (TYLENOL) 500 MG  tablet Take 500 mg by mouth every 6 (six) hours as needed.   Yes [provider]  ?amoxicillin-clavulanate (AUGMENTIN) 875-125 MG tablet Take 1 tablet by mouth 2 (two) times daily.   Yes [provider]  ?clotrimazole-betamethasone (LOTRISONE) cream Apply 1 application topically in the morning and at bedtime. 06/27/20  Yes [provider]  ?HYDROmorphone (DILAUDID)  4 MG tablet Take 1 tablet (4 mg total) by mouth every 4 (four) hours as needed for severe pain. 12/15/21  Yes [name], MD  ?lactose free nutrition (BOOST) LIQD Take 237 mLs by mouth 3 (three) times daily between meals.    Yes [provider]  ?levothyroxine (SYNTHROID) 50 MCG tablet Take 50 mcg by mouth daily. 10/01/21  Yes [provider]  ?oxyCODONE (OXY IR/ROXICODONE) 5 MG immediate release tablet Take 1-2 tablets (5-10 mg total) by mouth every 4 (four) hours as needed for moderate pain or severe pain. 12/14/21  Yes [name], MD  ?polyethylene glycol (MIRALAX / GLYCOLAX) 17 g packet Take 17 g by mouth daily as needed for mild constipation. 07/18/20  Yes Roxan Hockey, MD  ?  ? ?Family History  ?Problem Relation Age of Onset  ? Alcoholism Father   ? Colon cancer Neg Hx   ? Colon polyps Neg Hx   ? Diabetes Neg Hx   ? Kidney disease Neg Hx   ? Esophageal cancer Neg Hx   ? Heart disease Neg Hx   ? Gallbladder disease Neg Hx   ? Allergic rhinitis Neg Hx   ? Angioedema Neg Hx   ? Asthma Neg Hx   ? Atopy Neg Hx   ? Eczema Neg Hx   ? Immunodeficiency Neg Hx   ? Urticaria Neg Hx   ? ? ?Social History  ? ?Socioeconomic History  ? Marital status: Married  ?  Spouse name: Not on file  ? Number of children: 1  ? Years of education: Not on file  ? Highest education level: Not on file  ?Occupational History  ? Occupation: Retired  ?Tobacco Use  ? Smoking status: Former  ?  Packs/day: 1.00  ?  Years: 34.00  ?  Pack years: 34.00  ?  Types: Cigarettes  ?  Quit date: 12/26/1988  ?  Years since quitting: 33.0  ? Smokeless tobacco: Never  ?Vaping Use  ? Vaping Use: Never used  ?Substance and Sexual Activity  ? Alcohol use: Yes  ?  Alcohol/week: 6.0 standard drinks  ?  Types: 6 Cans of beer per week  ?  Comment: BEER  ? Drug use: No  ? Sexual activity: Never  ?Other Topics Concern  ? Not on file  ?Social History Narrative  ? Not on file  ? ?Social Determinants of Health  ? ?Financial Resource Strain: Not  on file  ?Food Insecurity: Not on file  ?Transportation Needs: Not on file  ?Physical Activity: Not on file  ?Stress: Not on file  ?Social Connections: Not on file  ? ?Review of Systems: A 12 point ROS discussed and pertinent positives are indicated in the HPI above.  All other systems are negative. ? ?Vital Signs: ?BP 111/81 (BP Location: Left Arm)   Pulse 81   Temp 98.2 ?F (36.8 ?C) (Oral)   Resp 16   Ht '5\' 11"'$  (1.803 m)   Wt 204 lb 1.6 oz (92.6 kg)   SpO2 93%   BMI 28.47 kg/m?  ? ?Physical Exam ?Constitutional:   ?   General: He  is not in acute distress. ?HENT:  ?   Mouth/Throat:  ?   Mouth: Mucous membranes are moist.  ?   Pharynx: Oropharynx is clear.  ?Eyes:  ?   Extraocular Movements: Extraocular movements intact.  ?Cardiovascular:  ?   Rate and Rhythm: Normal rate.  ?   Pulses: Normal pulses.  ?Pulmonary:  ?   Effort: Pulmonary effort is normal.  ?Abdominal:  ?   General: Abdomen is flat.  ?   Palpations: Abdomen is soft.  ?Skin: ?   General: Skin is warm and dry.  ?Neurological:  ?   General: No focal deficit present.  ?   Mental Status: He is alert.  ?Psychiatric:     ?   Mood and Affect: Mood normal.     ?   Behavior: Behavior normal.  ? ? ?Imaging: ?CT ABDOMEN PELVIS WO CONTRAST ? ?Result Date: 12/23/2021 ?CLINICAL DATA:  Left flank pain. EXAM: CT ABDOMEN AND PELVIS WITHOUT CONTRAST TECHNIQUE: Multidetector CT imaging of the abdomen and pelvis was performed following the standard protocol without IV contrast. RADIATION DOSE REDUCTION: This exam was performed according to the departmental dose-optimization program which includes automated exposure control, adjustment of the mA and/or kV according to patient size and/or use of iterative reconstruction technique. COMPARISON:  December 07, 2021 FINDINGS: Lower chest: Mild atelectasis is seen along the posterior aspect of the right lung base. Small bilateral pleural effusions are seen. Hepatobiliary: Stable 0.5 cm, 1.4 cm, 1.5 cm and 2.3 cm foci of  well-defined foci of low attenuation are seen within the right lobe of the liver. No gallstones, gallbladder wall thickening, or biliary dilatation. Pancreas: Unremarkable. No pancreatic ductal dilatation or surroun

## 2021-12-24 NOTE — Progress Notes (Signed)
Received request for L PCN placement. ?Case approved by Dr. Pascal Lux ?Confirmed with RN to facilitate transport to [facility] tomorrow morning by 9am ?Have made pt NPO at midnight.  Not on anticoagulant.  Antibiotic ordered. ?RN confirms pt can consent himself.  Formal consult note to be done upon arrival to [facility]. ? ?Electronically Signed: ?Pasty Spillers, PA-C ?12/24/2021, 2:50 PM ? ? ?

## 2021-12-24 NOTE — Progress Notes (Signed)
?   12/24/21 0000  ?Provider Notification  ?Provider Name/Title Arrien  ?Date Provider Notified 12/24/21  ?Time Provider Notified [phone]  ?Method of Notification Page  ?Notification Reason Critical result  ?Test performed and critical result Lactic Acid 2.0  ?Date Critical Result Received 12/24/21  ?Time Critical Result Received 0051  ?Provider response No new orders  ?Date of Provider Response 12/24/21  ?Time of Provider Response 0214  ? ? ?

## 2021-12-24 NOTE — TOC Progression Note (Signed)
?  Transition of Care (TOC) Screening Note ? ? ?Patient Details  ?Name: [name] ?Date of Birth: 1940-09-23 ? ? ?Transition of Care (TOC) CM/SW Contact:    ?Boneta Lucks, RN ?Phone Number: ?12/24/2021, 11:10 AM ? ? ? ?Transition of Care Department [facility]) has reviewed patient and no TOC needs have been identified at this time. We will continue to monitor patient advancement through interdisciplinary progression rounds. If new patient transition needs arise, please place a TOC consult. ? ? ?Following for [facility] needs.  ? ? ? ?Expected Discharge Plan: [place] ?  ? ?Expected Discharge Plan and Services ?Expected Discharge Plan: [place] ?  ?

## 2021-12-25 ENCOUNTER — Inpatient Hospital Stay (HOSPITAL_COMMUNITY): Payer: Medicare Other

## 2021-12-25 DIAGNOSIS — N12 Tubulo-interstitial nephritis, not specified as acute or chronic: Secondary | ICD-10-CM | POA: Diagnosis not present

## 2021-12-25 DIAGNOSIS — C679 Malignant neoplasm of bladder, unspecified: Secondary | ICD-10-CM | POA: Diagnosis not present

## 2021-12-25 DIAGNOSIS — N1330 Unspecified hydronephrosis: Secondary | ICD-10-CM | POA: Diagnosis not present

## 2021-12-25 DIAGNOSIS — J449 Chronic obstructive pulmonary disease, unspecified: Secondary | ICD-10-CM | POA: Diagnosis not present

## 2021-12-25 HISTORY — PX: IR NEPHROSTOMY PLACEMENT LEFT: IMG6063

## 2021-12-25 LAB — CBC
HCT: 36.1 % — ABNORMAL LOW (ref 39.0–52.0)
Hemoglobin: 11.8 g/dL — ABNORMAL LOW (ref 13.0–17.0)
MCH: 35.2 pg — ABNORMAL HIGH (ref 26.0–34.0)
MCHC: 32.7 g/dL (ref 30.0–36.0)
MCV: 107.8 fL — ABNORMAL HIGH (ref 80.0–100.0)
Platelets: 156 10*3/uL (ref 150–400)
RBC: 3.35 MIL/uL — ABNORMAL LOW (ref 4.22–5.81)
RDW: 13.2 % (ref 11.5–15.5)
WBC: 6.9 10*3/uL (ref 4.0–10.5)
nRBC: 0 % (ref 0.0–0.2)

## 2021-12-25 LAB — C DIFFICILE QUICK SCREEN W PCR REFLEX
C Diff antigen: POSITIVE — AB
C Diff interpretation: DETECTED
C Diff toxin: POSITIVE — AB

## 2021-12-25 LAB — URINE CULTURE

## 2021-12-25 MED ORDER — DIPHENOXYLATE-ATROPINE 2.5-0.025 MG PO TABS
1.0000 | ORAL_TABLET | Freq: Four times a day (QID) | ORAL | Status: DC | PRN
  Administered 2021-12-25: 1 via ORAL
  Filled 2021-12-25: qty 1

## 2021-12-25 MED ORDER — MIDAZOLAM HCL 2 MG/2ML IJ SOLN
INTRAMUSCULAR | Status: DC | PRN
  Administered 2021-12-25 (×3): .5 mg via INTRAVENOUS

## 2021-12-25 MED ORDER — VANCOMYCIN HCL 125 MG PO CAPS
125.0000 mg | ORAL_CAPSULE | Freq: Four times a day (QID) | ORAL | Status: DC
  Administered 2021-12-25 – 2021-12-26 (×3): 125 mg via ORAL
  Filled 2021-12-25 (×12): qty 1

## 2021-12-25 MED ORDER — VANCOMYCIN 50 MG/ML ORAL SOLUTION: 125.0000 mg | Freq: Four times a day (QID) | ORAL | Status: DC

## 2021-12-25 MED ORDER — LIDOCAINE HCL 1 % IJ SOLN
INTRAMUSCULAR | Status: DC | PRN
  Administered 2021-12-25: 10 mL via INTRADERMAL

## 2021-12-25 MED ORDER — FENTANYL CITRATE (PF) 100 MCG/2ML IJ SOLN
INTRAMUSCULAR | Status: DC | PRN
  Administered 2021-12-25 (×3): 25 ug via INTRAVENOUS

## 2021-12-25 MED ORDER — IOHEXOL 300 MG/ML  SOLN: 100.0000 mL | Freq: Once | INTRAMUSCULAR | Status: DC | PRN

## 2021-12-25 NOTE — Progress Notes (Signed)
?PROGRESS NOTE ? ? ? ?[name]  FYB:[id number] DOB: February 24, 1941 DOA: 12/23/2021 ?PCP: [name], MD  ? ? ?Brief Narrative:  ?81 year old male with a history of bladder cancer status post cystectomy/ileal conduit, recurrent urinary tract infections, admitted with left flank pain.  Found to have pyelonephritis and obstructive uropathy with hydronephrosis.  Started on intravenous antibiotics.  Seen by urology with recommendations for percutaneous nephrostomy.  Interventional radiology following and plans for nephrostomy placement on 5/12. ? ? ?Assessment & Plan: ?  ?Principal Problem: ?  Pyelonephritis ?Active Problems: ?  Malignant neoplasm of urinary bladder ([place]) ?  Bladder cancer ([place]) ?  Status post ileal conduit ([place]) ?  COPD (chronic obstructive pulmonary disease) ([place]) ? ? ?Left pyelonephritis with obstructive uropathy and hydronephrosis ?-Currently on intravenous antibiotics ?-Follow-up urine culture ?-Seen by urology with recommendations for percutaneous nephrostomy ?-Interventional radiology evaluated patient for nephrostomy placement, but on repeat imaging it was noted that hydronephrosis was resolved ? ?History of malignant neoplasm of urinary bladder ?-Status post radical cystectomy ?-Continue follow-up with urology and oncology ? ?COPD ?-No shortness of breath at this time ?-Continue as needed bronchodilators ? ?Hypothyroidism ?-Continue Synthroid ? ? ?DVT prophylaxis: enoxaparin (LOVENOX) injection 40 mg Start: 12/25/21 2200 ?SCDs Start: 12/23/21 2313 ? ?Code Status: Full code ?Family Communication: Discussed with his wife at the bedside ?Disposition Plan: Status is: Inpatient ?Remains inpatient appropriate because: Needs further management of hydronephrosis with percutaneous nephrostomy ? ? ? ? ?Consultants:  ?Urology ?Interventional radiology ? ?Procedures:  ? ? ?Antimicrobials:  ?Imipenem 5/11 > ? ? ?Subjective: ?He is feeling better.  Flank pain has resolved.  No vomiting.  He went for  nephrostomy placement today, but on repeat evaluation, hydronephrosis appears to have resolved and nephrostomy was not placed. ? ?Objective: ?Vitals:  ? 12/25/21 1005 12/25/21 1020 12/25/21 1035 12/25/21 1307  ?BP: (!) 145/77 (!) 151/80 (!) 153/81 (!) 150/78  ?Pulse: 96 78 91 72  ?Resp: 19 (!) 21 (!) 24   ?Temp:      ?TempSrc:      ?SpO2: 96% 97% 96% 94%  ?Weight:      ?Height:      ? ? ?Intake/Output Summary (Last 24 hours) at 12/25/2021 2049 ?Last data filed at 12/25/2021 1839 ?Gross per 24 hour  ?Intake 2803.2 ml  ?Output 450 ml  ?Net 2353.2 ml  ? ?Filed Weights  ? 12/23/21 1635 12/23/21 2310  ?Weight: 93 kg 92.6 kg  ? ? ?Examination: ? ?General exam: Appears calm and comfortable  ?Respiratory system: Clear to auscultation. Respiratory effort normal. ?Cardiovascular system: S1 & S2 heard, RRR. No JVD, murmurs, rubs, gallops or clicks. No pedal edema. ?Gastrointestinal system: Abdomen is nondistended, soft and nontender. No organomegaly or masses felt. Normal bowel sounds heard.  Right lower abdomen urostomy bag ?Central nervous system: Alert and oriented. No focal neurological deficits. ?Extremities: Symmetric 5 x 5 power. ?Skin: No rashes, lesions or ulcers ?Psychiatry: Judgement and insight appear normal. Mood & affect appropriate.  ? ? ? ?Data Reviewed: I have personally reviewed following labs and imaging studies ? ?CBC: ?Recent Labs  ?Lab 12/23/21 ?1737 12/24/21 ?0002 12/25/21 ?5277  ?WBC 25.0* 20.8* 6.9  ?HGB 15.5 13.7 11.8*  ?HCT 48.2 40.4 36.1*  ?MCV 106.9* 105.8* 107.8*  ?PLT 224 178 156  ? ?Basic Metabolic Panel: ?Recent Labs  ?Lab 12/23/21 ?1737 12/24/21 ?0002  ?NA 139 140  ?K 4.5 4.5  ?CL 104 108  ?CO2 24 25  ?GLUCOSE 127* 133*  ?BUN 16 15  ?  CREATININE 1.17 1.10  ?CALCIUM 9.0 8.6*  ? ?GFR: ?Estimated Creatinine Clearance: 62.3 mL/min (by C-G formula based on SCr of 1.1 mg/dL). ?Liver Function Tests: ?No results for input(s): AST, ALT, ALKPHOS, BILITOT, PROT, ALBUMIN in the last 168 hours. ?No results  for input(s): LIPASE, AMYLASE in the last 168 hours. ?No results for input(s): AMMONIA in the last 168 hours. ?Coagulation Profile: ?Recent Labs  ?Lab 12/24/21 ?1559  ?INR 1.2  ? ?Cardiac Enzymes: ?No results for input(s): CKTOTAL, CKMB, CKMBINDEX, TROPONINI in the last 168 hours. ?BNP (last 3 results) ?No results for input(s): PROBNP in the last 8760 hours. ?HbA1C: ?No results for input(s): HGBA1C in the last 72 hours. ?CBG: ?No results for input(s): GLUCAP in the last 168 hours. ?Lipid Profile: ?No results for input(s): CHOL, HDL, LDLCALC, TRIG, CHOLHDL, LDLDIRECT in the last 72 hours. ?Thyroid Function Tests: ?No results for input(s): TSH, T4TOTAL, FREET4, T3FREE, THYROIDAB in the last 72 hours. ?Anemia Panel: ?No results for input(s): VITAMINB12, FOLATE, FERRITIN, TIBC, IRON, RETICCTPCT in the last 72 hours. ?Sepsis Labs: ?Recent Labs  ?Lab 12/23/21 ?2155 12/24/21 ?0002  ?LATICACIDVEN 1.8 2.0*  ? ? ?Recent Results (from the past 240 hour(s))  ?Culture, blood (routine x 2)     Status: None (Preliminary result)  ? Collection Time: 12/23/21  8:39 PM  ? Specimen: BLOOD LEFT HAND  ?Result Value Ref Range Status  ? Specimen Description BLOOD LEFT HAND  Final  ? Special Requests   Final  ?  BOTTLES DRAWN AEROBIC ONLY Blood Culture adequate volume  ? Culture   Final  ?  NO GROWTH 2 DAYS ?Performed at [facility], [address], [place], [place] [address] ?  ? Report Status PENDING  Incomplete  ?Culture, blood (routine x 2)     Status: None (Preliminary result)  ? Collection Time: 12/23/21  8:39 PM  ? Specimen: BLOOD LEFT FOREARM  ?Result Value Ref Range Status  ? Specimen Description BLOOD LEFT FOREARM  Final  ? Special Requests   Final  ?  BOTTLES DRAWN AEROBIC ONLY Blood Culture results may not be optimal due to an inadequate volume of blood received in culture bottles  ? Culture   Final  ?  NO GROWTH 2 DAYS ?Performed at [facility], [address] [place], [place] [address] ?  ? Report Status PENDING  Incomplete   ?Urine Culture     Status: Abnormal  ? Collection Time: 12/24/21 12:07 PM  ? Specimen: Urine, Catheterized  ?Result Value Ref Range Status  ? Specimen Description   Final  ?  URINE, CATHETERIZED ?Performed at [facility], [address], [place], [place] [address] ?  ? Special Requests   Final  ?  NONE ?Performed at [facility], [address], [place], [place] [address] ?  ? Culture MULTIPLE SPECIES PRESENT, SUGGEST RECOLLECTION (A)  Final  ? Report Status 12/25/2021 FINAL  Final  ?  ? ? ? ? ? ?Radiology Studies: ?CT RENAL STONE STUDY ? ?Result Date: 12/25/2021 ?CLINICAL DATA:  History of bladder cancer with ileal conduit creation now with concern for left-sided ureteral obstruction Plan was to proceed with left-sided nephrostomy tube placement however sonographic evaluation demonstrated apparent resolution of previously noted left-sided pelvicaliectasis. Please perform noncontrast CT scan the abdomen pelvis for further evaluation. EXAM: CT ABDOMEN AND PELVIS WITHOUT CONTRAST TECHNIQUE: Multidetector CT imaging of the abdomen and pelvis was performed following the standard protocol without IV contrast. RADIATION DOSE REDUCTION: This exam was performed according to the departmental  dose-optimization program which includes automated exposure control, adjustment of the mA and/or kV according to patient size and/or use of iterative reconstruction technique. COMPARISON:  12/23/2021; 12/07/2021; 01/24/2019 FINDINGS: Lower chest: Limited visualization of the lower thorax redemonstrates trace bilateral effusions and associated bibasilar opacities, right slightly greater than left. Normal heart size. Calcifications involving the mitral valve annulus, incompletely evaluated. No pericardial effusion. Hepatobiliary: Normal hepatic contour. Previously characterized hepatic cysts are incompletely characterized on present examination though morphologically similar to the 01/24/2019 examination. Pancreas: Normal noncontrast  appearance of the pancreas. Spleen: Normal noncontrast appearance of the spleen. There is a punctate splenule about the anterior aspect of the spleen. Adrenals/Urinary Tract: Previously noted left-sided caliectasi

## 2021-12-25 NOTE — Sedation Documentation (Signed)
No hydronephrosis noted on [place] per Dr. Laurence Ferrari. No drain placed at this time. PA to reach out to physician to confirm CT scan prior to departure back to [place]. Patient is alert and oriented eating and drinking at this time.  ?

## 2021-12-25 NOTE — Progress Notes (Signed)
Stool tested positive for C diff.  ?Started patient on oral vancomycin per protocol.  ?

## 2021-12-25 NOTE — Progress Notes (Signed)
Patient transferred back to [facility] at [facility] by carelink from [facility]. Patient resting comfortably and eating and drinking.  ?

## 2021-12-26 DIAGNOSIS — N12 Tubulo-interstitial nephritis, not specified as acute or chronic: Secondary | ICD-10-CM | POA: Diagnosis not present

## 2021-12-26 DIAGNOSIS — A0472 Enterocolitis due to Clostridium difficile, not specified as recurrent: Secondary | ICD-10-CM | POA: Diagnosis not present

## 2021-12-26 DIAGNOSIS — J449 Chronic obstructive pulmonary disease, unspecified: Secondary | ICD-10-CM | POA: Diagnosis not present

## 2021-12-26 DIAGNOSIS — C679 Malignant neoplasm of bladder, unspecified: Secondary | ICD-10-CM | POA: Diagnosis not present

## 2021-12-26 MED ORDER — SULFAMETHOXAZOLE-TRIMETHOPRIM 800-160 MG PO TABS
1.0000 | ORAL_TABLET | Freq: Two times a day (BID) | ORAL | 0 refills | Status: AC
Start: 2021-12-26 — End: 2021-12-29

## 2021-12-26 MED ORDER — SULFAMETHOXAZOLE-TRIMETHOPRIM 800-160 MG PO TABS
1.0000 | ORAL_TABLET | Freq: Two times a day (BID) | ORAL | Status: DC
  Administered 2021-12-26: 1 via ORAL
  Filled 2021-12-26: qty 1

## 2021-12-26 MED ORDER — VANCOMYCIN HCL 125 MG PO CAPS
125.0000 mg | ORAL_CAPSULE | Freq: Four times a day (QID) | ORAL | 0 refills | Status: AC
Start: 2021-12-26 — End: 2022-01-05

## 2021-12-26 MED ORDER — VANCOMYCIN HCL 125 MG PO CAPS: 125.0000 mg | ORAL_CAPSULE | Freq: Four times a day (QID) | ORAL | 0 refills | Status: DC

## 2021-12-26 NOTE — Discharge Summary (Signed)
Physician Discharge Summary  ?[name] OVZ:[id number] DOB: 1940/08/25 DOA: 12/23/2021 ? ?PCP: [name], MD ? ?Admit date: 12/23/2021 ?Discharge date: 12/26/2021 ? ?Admitted From: Home ?Disposition: Home ? ?Recommendations for Outpatient Follow-up:  ?Follow up with PCP in 1-2 weeks ?Please obtain BMP/CBC in one week ?Follow-up with urology as needed ? ?Home Health: ?Equipment/Devices: ? ?Discharge Condition: Stable ?CODE STATUS: Full code ?Diet recommendation: Regular diet ? ?Brief/Interim Summary: ?81 year old male with a history of bladder cancer status post cystectomy/ileal conduit, recurrent urinary tract infections, admitted with left flank pain.  Found to have pyelonephritis and obstructive uropathy with hydronephrosis.  Started on intravenous antibiotics.  Seen by urology with recommendations for percutaneous nephrostomy.  He was seen by interventional radiology the following day and when reimaged, it was noted that hydronephrosis had resolved.  Nephrostomy was not placed.  Subsequently, he did develop some loose stools and tested positive for C. difficile.  He has been started on oral vancomycin with improvement of stools and is being discharged home. ? ?Discharge Diagnoses:  ?Principal Problem: ?  Pyelonephritis ?Active Problems: ?  Malignant neoplasm of urinary bladder ([place]) ?  Bladder cancer ([place]) ?  Status post ileal conduit ([place]) ?  COPD (chronic obstructive pulmonary disease) ([place]) ?  C. difficile diarrhea ? ?Left pyelonephritis with obstructive uropathy and hydronephrosis ?-Treated with meropenem ?-Urine culture with no specific growth ?-Seen by urology with recommendations for percutaneous nephrostomy ?-Interventional radiology evaluated patient for nephrostomy placement, but on repeat imaging it was noted that hydronephrosis was resolved ?-Follow-up with urology as needed ?  ?History of malignant neoplasm of urinary bladder ?-Status post radical cystectomy ?-Continue follow-up with urology  and oncology ?  ?COPD ?-No shortness of breath at this time ?-Continue as needed bronchodilators ?  ?Hypothyroidism ?-Continue Synthroid ? ?C. difficile diarrhea ?-Reported to have loose stools which was positive for C. Difficile ?-Started on oral vancomycin course with improvement of frequency of stools ? ?Discharge Instructions ? ?Discharge Instructions   ? ? Diet - low sodium heart healthy   Complete by: As directed ?  ? Increase activity slowly   Complete by: As directed ?  ? No wound care   Complete by: As directed ?  ? ?  ? ?Allergies as of 12/26/2021   ? ?   Reactions  ? Ciprofloxacin Hives  ? Cefepime Rash  ? Zofran [ondansetron] Rash  ? ?  ? ?  ?Medication List  ?  ? ?STOP taking these medications   ? ?amoxicillin-clavulanate 875-125 MG tablet ?Commonly known as: AUGMENTIN ?  ? ?  ? ?TAKE these medications   ? ?acetaminophen 500 MG tablet ?Commonly known as: TYLENOL ?Take 500 mg by mouth every 6 (six) hours as needed. ?  ?clotrimazole-betamethasone cream ?Commonly known as: LOTRISONE ?Apply 1 application topically in the morning and at bedtime. ?  ?HYDROmorphone 4 MG tablet ?Commonly known as: DILAUDID ?Take 1 tablet (4 mg total) by mouth every 4 (four) hours as needed for severe pain. ?  ?lactose free nutrition Liqd ?Take 237 mLs by mouth 3 (three) times daily between meals. ?  ?levothyroxine 50 MCG tablet ?Commonly known as: SYNTHROID ?Take 50 mcg by mouth daily. ?  ?oxyCODONE 5 MG immediate release tablet ?Commonly known as: Oxy IR/ROXICODONE ?Take 1-2 tablets (5-10 mg total) by mouth every 4 (four) hours as needed for moderate pain or severe pain. ?  ?polyethylene glycol 17 g packet ?Commonly known as: MIRALAX / GLYCOLAX ?Take 17 g by mouth daily as needed for mild  constipation. ?  ?sulfamethoxazole-trimethoprim 800-160 MG tablet ?Commonly known as: BACTRIM DS ?Take 1 tablet by mouth every 12 (twelve) hours for 3 days. ?  ?vancomycin 125 MG capsule ?Commonly known as: VANCOCIN ?Take 1 capsule (125 mg  total) by mouth 4 (four) times daily for 10 days. ?  ? ?  ? ? Follow-up Information   ? ? McKenzie, Candee Furbish, MD. Schedule an appointment as soon as possible for a visit in 2 week(s).   ?Specialty: Urology ?Contact information: ?[address]  ?[address] ?[place] [id number] ?[phone] ? ? ?  ?  ? ?  ?  ? ?  ? ?Allergies  ?Allergen Reactions  ? Ciprofloxacin Hives  ? Cefepime Rash  ? Zofran [Ondansetron] Rash  ? ? ?Consultations: ?Urology ?Interventional radiology ? ? ?Procedures/Studies: ?CT ABDOMEN PELVIS WO CONTRAST ? ?Result Date: 12/23/2021 ?CLINICAL DATA:  Left flank pain. EXAM: CT ABDOMEN AND PELVIS WITHOUT CONTRAST TECHNIQUE: Multidetector CT imaging of the abdomen and pelvis was performed following the standard protocol without IV contrast. RADIATION DOSE REDUCTION: This exam was performed according to the departmental dose-optimization program which includes automated exposure control, adjustment of the mA and/or kV according to patient size and/or use of iterative reconstruction technique. COMPARISON:  December 07, 2021 FINDINGS: Lower chest: Mild atelectasis is seen along the posterior aspect of the right lung base. Small bilateral pleural effusions are seen. Hepatobiliary: Stable 0.5 cm, 1.4 cm, 1.5 cm and 2.3 cm foci of well-defined foci of low attenuation are seen within the right lobe of the liver. No gallstones, gallbladder wall thickening, or biliary dilatation. Pancreas: Unremarkable. No pancreatic ductal dilatation or surrounding inflammatory changes. Spleen: Normal in size without focal abnormality. Adrenals/Urinary Tract: Adrenal glands are unremarkable. Kidneys are normal in size, without focal lesions. Marked severity left-sided hydronephrosis and hydroureter are seen. This extends to the level of the right lower quadrant of the abdomen and is increased in severity when compared to the prior study. Mild to moderate severity left-sided perinephric inflammatory fat stranding is also  noted. The urinary bladder is surgically absent. Stomach/Bowel: There is a small, stable hiatal hernia. Appendix appears normal. Surgically anastomosed bowel is seen within the right lower quadrant. Stool is seen throughout the large bowel. No evidence of bowel wall thickening, distention, or inflammatory changes. Noninflamed diverticula are noted throughout the sigmoid colon. Vascular/Lymphatic: Aortic atherosclerosis. No enlarged abdominal or pelvic lymph nodes. Reproductive: The prostate gland is surgically absent. Other: There is a right lower quadrant ostomy site. No abdominopelvic ascites. Musculoskeletal: A stable expansile lesion is seen involving the right inferior pubic ramus. Degenerative changes are seen throughout the lumbar spine. IMPRESSION: 1. Findings, as described above, suggestive of partial obstruction of the left kidney. Sequelae associated with left-sided acute pyelonephritis cannot be excluded. Correlation with urinalysis is recommended. 2. Small bilateral pleural effusions. 3. Evidence of prior cystoprostatectomy and ileal conduit formation. 4. Sigmoid diverticulosis. 5. Small, stable hiatal hernia. 6. Stable hepatic cysts and/or hemangiomas. Aortic Atherosclerosis (ICD10-I70.0). Electronically Signed   By: Virgina Norfolk M.D.   On: 12/23/2021 19:14  ? ?CT CHEST ABDOMEN PELVIS W CONTRAST ? ?Result Date: 12/07/2021 ?CLINICAL DATA:  History of metastatic urothelial carcinoma. Sepsis. Complains of fever and vomiting. EXAM: CT CHEST, ABDOMEN, AND PELVIS WITH CONTRAST TECHNIQUE: Multidetector CT imaging of the chest, abdomen and pelvis was performed following the standard protocol during bolus administration of intravenous contrast. RADIATION DOSE REDUCTION: This exam was performed according to the departmental dose-optimization program which includes automated exposure control, adjustment  of the mA and/or kV according to patient size and/or use of iterative reconstruction technique. CONTRAST:   157m OMNIPAQUE IOHEXOL 300 MG/ML  SOLN COMPARISON:  PET-CT 10/22/2021 FINDINGS: CT CHEST FINDINGS Cardiovascular: Aortic atherosclerosis and coronary artery calcifications. Normal heart size. No pericardial ef

## 2021-12-28 ENCOUNTER — Ambulatory Visit: Payer: Medicare Other

## 2021-12-28 LAB — CULTURE, BLOOD (ROUTINE X 2)
Culture: NO GROWTH
Culture: NO GROWTH
Special Requests: ADEQUATE

## 2021-12-29 ENCOUNTER — Encounter: Payer: Self-pay | Admitting: Urology

## 2021-12-29 NOTE — Progress Notes (Signed)
Telephone appointment. I verified patient's identity and began nursing interview. Patient reports recovering from a bacterial infection in his kidneys, otherwise patient is doing well. ? ?Meaningful use complete. ?I-PSS score of 0. ?No urinary management medications. ?Urology appointment- None ? ?Reminded patient of his 9:30am-12/30/21 telephone appointment w/ Ashlyn Bruning PA-C. I left my extension [phone] in case patient needs anything. Patient verbalized understanding of conversation. ? ?Patient preferred contact [phone] ?

## 2021-12-30 ENCOUNTER — Ambulatory Visit
Admission: RE | Admit: 2021-12-30 | Discharge: 2021-12-30 | Disposition: A | Discharge status: Home / Self Care | Payer: Medicare Other | Source: Ambulatory Visit | Attending: Urology | Admitting: Urology

## 2021-12-30 DIAGNOSIS — C771 Secondary and unspecified malignant neoplasm of intrathoracic lymph nodes: Secondary | ICD-10-CM | POA: Insufficient documentation

## 2021-12-30 NOTE — Progress Notes (Signed)
?Radiation Oncology         [phone]) [phone] ?________________________________ ? ?Name: [name] MRN: [id number]  ?Date: 12/30/2021  DOB: Mar 12, 1941 ? ?Post Treatment Note ? ?CC: [name], MD  [name], MD ? ?Diagnosis:   81 y.o. gentleman with Stage IV  (pT3b, pN0) high-grade urothelial carcinoma of the bladder with disease progression in the intrathoracic lymph nodes.    ? ?Interval Since Last Radiation:  4.5 weeks  ?11/16/21 - 11/27/21: The PET positive lymph node targets was treated to 40 Gy in 10 fractions of 4 Gy ? ?04/09/20 - 04/23/20:  The RUL and right paratracheal node targets were treated to 50 Gy in 10 fractions of 5 Gy each ?  ?01/02/18 - 01/16/18: Right ischial lesion was treated to 40 Gy with 10 fx of 4 Gy ? ?Narrative:  I spoke with the patient to conduct his routine scheduled 1 month follow up visit via telephone to spare the patient unnecessary potential exposure in the healthcare setting during the current COVID-19 pandemic.  The patient was notified in advance and gave permission to proceed with this visit format. ? ?He tolerated radiation treatment relatively well without any ill side effects aside from modest fatigue.                             ? ?On review of systems, the patient states that he is doing fair in general.  He is currently recovering from a recent hospitalization for pyelonephritis and is completing antibiotics as prescribed.  He still feels quite weak and deconditioned but specifically denies gross hematuria, dysuria, straining to void, incomplete bladder emptying or incontinence.  He denies any abdominal pain, dysphagia.  Nausea, vomiting, diarrhea or constipation.  He has decreased appetite but continues to eat in an attempt to maintain his weight.  Overall, he feels that he tolerated the radiation very well and is pleased with his progress to date. ? ?ALLERGIES:  is allergic to ciprofloxacin, cefepime, and zofran [ondansetron]. ? ?Meds: ?Current Outpatient Medications   ?Medication Sig Dispense Refill  ? acetaminophen (TYLENOL) 500 MG tablet Take 500 mg by mouth every 6 (six) hours as needed.    ? clotrimazole-betamethasone (LOTRISONE) cream Apply 1 application topically in the morning and at bedtime.    ? HYDROmorphone (DILAUDID) 4 MG tablet Take 1 tablet (4 mg total) by mouth every 4 (four) hours as needed for severe pain. 120 tablet 0  ? lactose free nutrition (BOOST) LIQD Take 237 mLs by mouth 3 (three) times daily between meals.     ? levothyroxine (SYNTHROID) 50 MCG tablet Take 50 mcg by mouth daily.    ? oxyCODONE (OXY IR/ROXICODONE) 5 MG immediate release tablet Take 1-2 tablets (5-10 mg total) by mouth every 4 (four) hours as needed for moderate pain or severe pain. 120 tablet 0  ? polyethylene glycol (MIRALAX / GLYCOLAX) 17 g packet Take 17 g by mouth daily as needed for mild constipation. 14 each 0  ? vancomycin (VANCOCIN) 125 MG capsule Take 1 capsule (125 mg total) by mouth 4 (four) times daily for 10 days. 40 capsule 0  ? ?Current Facility-Administered Medications  ?Medication Dose Route Frequency Provider Last Rate Last Admin  ? omalizumab Arvid Right) prefilled syringe 300 mg  300 mg Subcutaneous Q14 Days Valentina Shaggy, MD   300 mg at 12/14/21 7048  ? ? ?Physical Findings: ? vitals were not taken for this visit.  ?Pain Assessment ?  Pain Score: 0-No pain/10 ?Unable to assess due to telephone follow-up visit format. ? ?Lab Findings: ?Lab Results  ?Component Value Date  ? WBC 6.9 12/25/2021  ? HGB 11.8 (L) 12/25/2021  ? HCT 36.1 (L) 12/25/2021  ? MCV 107.8 (H) 12/25/2021  ? PLT 156 12/25/2021  ? ? ? ?Radiographic Findings: ?CT ABDOMEN PELVIS WO CONTRAST ? ?Result Date: 12/23/2021 ?CLINICAL DATA:  Left flank pain. EXAM: CT ABDOMEN AND PELVIS WITHOUT CONTRAST TECHNIQUE: Multidetector CT imaging of the abdomen and pelvis was performed following the standard protocol without IV contrast. RADIATION DOSE REDUCTION: This exam was performed according to the departmental  dose-optimization program which includes automated exposure control, adjustment of the mA and/or kV according to patient size and/or use of iterative reconstruction technique. COMPARISON:  December 07, 2021 FINDINGS: Lower chest: Mild atelectasis is seen along the posterior aspect of the right lung base. Small bilateral pleural effusions are seen. Hepatobiliary: Stable 0.5 cm, 1.4 cm, 1.5 cm and 2.3 cm foci of well-defined foci of low attenuation are seen within the right lobe of the liver. No gallstones, gallbladder wall thickening, or biliary dilatation. Pancreas: Unremarkable. No pancreatic ductal dilatation or surrounding inflammatory changes. Spleen: Normal in size without focal abnormality. Adrenals/Urinary Tract: Adrenal glands are unremarkable. Kidneys are normal in size, without focal lesions. Marked severity left-sided hydronephrosis and hydroureter are seen. This extends to the level of the right lower quadrant of the abdomen and is increased in severity when compared to the prior study. Mild to moderate severity left-sided perinephric inflammatory fat stranding is also noted. The urinary bladder is surgically absent. Stomach/Bowel: There is a small, stable hiatal hernia. Appendix appears normal. Surgically anastomosed bowel is seen within the right lower quadrant. Stool is seen throughout the large bowel. No evidence of bowel wall thickening, distention, or inflammatory changes. Noninflamed diverticula are noted throughout the sigmoid colon. Vascular/Lymphatic: Aortic atherosclerosis. No enlarged abdominal or pelvic lymph nodes. Reproductive: The prostate gland is surgically absent. Other: There is a right lower quadrant ostomy site. No abdominopelvic ascites. Musculoskeletal: A stable expansile lesion is seen involving the right inferior pubic ramus. Degenerative changes are seen throughout the lumbar spine. IMPRESSION: 1. Findings, as described above, suggestive of partial obstruction of the left kidney.  Sequelae associated with left-sided acute pyelonephritis cannot be excluded. Correlation with urinalysis is recommended. 2. Small bilateral pleural effusions. 3. Evidence of prior cystoprostatectomy and ileal conduit formation. 4. Sigmoid diverticulosis. 5. Small, stable hiatal hernia. 6. Stable hepatic cysts and/or hemangiomas. Aortic Atherosclerosis (ICD10-I70.0). Electronically Signed   By: Virgina Norfolk M.D.   On: 12/23/2021 19:14  ? ?CT CHEST ABDOMEN PELVIS W CONTRAST ? ?Result Date: 12/07/2021 ?CLINICAL DATA:  History of metastatic urothelial carcinoma. Sepsis. Complains of fever and vomiting. EXAM: CT CHEST, ABDOMEN, AND PELVIS WITH CONTRAST TECHNIQUE: Multidetector CT imaging of the chest, abdomen and pelvis was performed following the standard protocol during bolus administration of intravenous contrast. RADIATION DOSE REDUCTION: This exam was performed according to the departmental dose-optimization program which includes automated exposure control, adjustment of the mA and/or kV according to patient size and/or use of iterative reconstruction technique. CONTRAST:  116m OMNIPAQUE IOHEXOL 300 MG/ML  SOLN COMPARISON:  PET-CT 10/22/2021 FINDINGS: CT CHEST FINDINGS Cardiovascular: Aortic atherosclerosis and coronary artery calcifications. Normal heart size. No pericardial effusion. Mediastinum/Nodes: No enlarged axillary or supraclavicular lymph nodes. Tracer avid subcarinal lymph node measures 1.3 cm, image 33/2. This is not significantly changed from previous exam. Lungs/Pleura: Trace right pleural effusion is decreased from previous exam.  Left pleural effusion has resolved in the interval. Bandlike area of architectural distortion within the right apex is stable from previous exam, image 35/4. Anterior left upper lobe lung nodule is stable measuring 1.2 cm, image 46/4. Subpleural nodular density within the anteromedial left upper lobe is stable measuring 6 mm, image 90/4. 4 mm nodule in the superior  segment of left lower lobe is unchanged, image 79/4. No airspace consolidation, pleural effusion, or pneumothorax. Musculoskeletal: No chest wall mass or suspicious bone lesions identified. No acute or sus

## 2021-12-30 NOTE — Progress Notes (Signed)
?  Radiation Oncology         [phone] ?________________________________ ? ?Name: [name] MRN: [id number]  ?Date: 11/27/2021  DOB: 1940/11/04 ? ?End of Treatment Note ? ?Diagnosis:   81 y.o. gentleman with Stage IV  (pT3b, pN0) high-grade urothelial carcinoma of the bladder with disease progression in the intrathoracic lymph nodes.    ? ?Indication for treatment:  Curative, Definitive SBRT      ? ?Radiation treatment dates:   11/16/21 - 11/27/21 ? ?Site/dose:   The PET positive lymph node targets was treated to 40 Gy in 10 fractions of 4 Gy ? ?Beams/energy:   The patient was treated using ultra-hypofractionated radiotherapy [place] Daily) according to a 3D conformal radiotherapy plan.  Volumetric arc fields were employed to deliver 6 MV X-rays.  Image guidance was performed with per fraction cone beam CT prior to treatment under personal MD supervision.  Immobilization was achieved using BodyFix Pillow. ? ?Narrative: The patient tolerated radiation treatment relatively well without any ill side effects aside from modest fatigue. ? ?Plan: The patient has completed radiation treatment. The patient will return to radiation oncology clinic for routine followup in one month. I advised them to call or return sooner if they have any questions or concerns related to their recovery or treatment. ?________________________________ ? ?Sheral Apley Tammi Klippel, M.D. ? ?  ?

## 2021-12-31 ENCOUNTER — Other Ambulatory Visit: Payer: Medicare Other

## 2021-12-31 DIAGNOSIS — I10 Essential (primary) hypertension: Secondary | ICD-10-CM | POA: Diagnosis not present

## 2021-12-31 DIAGNOSIS — N390 Urinary tract infection, site not specified: Secondary | ICD-10-CM | POA: Diagnosis not present

## 2021-12-31 DIAGNOSIS — E663 Overweight: Secondary | ICD-10-CM | POA: Diagnosis not present

## 2021-12-31 DIAGNOSIS — Z6828 Body mass index (BMI) 28.0-28.9, adult: Secondary | ICD-10-CM | POA: Diagnosis not present

## 2021-12-31 DIAGNOSIS — J449 Chronic obstructive pulmonary disease, unspecified: Secondary | ICD-10-CM | POA: Diagnosis not present

## 2021-12-31 DIAGNOSIS — E039 Hypothyroidism, unspecified: Secondary | ICD-10-CM | POA: Diagnosis not present

## 2022-01-03 DIAGNOSIS — L501 Idiopathic urticaria: Secondary | ICD-10-CM | POA: Diagnosis not present

## 2022-01-04 ENCOUNTER — Ambulatory Visit (INDEPENDENT_AMBULATORY_CARE_PROVIDER_SITE_OTHER): Payer: Medicare Other

## 2022-01-04 DIAGNOSIS — L501 Idiopathic urticaria: Secondary | ICD-10-CM

## 2022-01-06 ENCOUNTER — Telehealth: Payer: Self-pay | Admitting: Oncology

## 2022-01-06 NOTE — Telephone Encounter (Signed)
Called patient regarding upcoming appointment, patient is notified. °

## 2022-01-07 ENCOUNTER — Inpatient Hospital Stay: Payer: Medicare Other

## 2022-01-07 ENCOUNTER — Other Ambulatory Visit: Payer: Self-pay

## 2022-01-07 ENCOUNTER — Inpatient Hospital Stay: Payer: Medicare Other | Attending: Oncology | Admitting: Oncology

## 2022-01-07 VITALS — BP 129/71 | HR 84 | Temp 97.8°F | Resp 18 | Ht 71.0 in | Wt 207.0 lb

## 2022-01-07 DIAGNOSIS — C679 Malignant neoplasm of bladder, unspecified: Secondary | ICD-10-CM

## 2022-01-07 DIAGNOSIS — C7800 Secondary malignant neoplasm of unspecified lung: Secondary | ICD-10-CM | POA: Diagnosis not present

## 2022-01-07 DIAGNOSIS — C7951 Secondary malignant neoplasm of bone: Secondary | ICD-10-CM | POA: Insufficient documentation

## 2022-01-07 DIAGNOSIS — Z923 Personal history of irradiation: Secondary | ICD-10-CM | POA: Diagnosis not present

## 2022-01-07 LAB — CMP (CANCER CENTER ONLY)
ALT: 18 U/L (ref 0–44)
AST: 14 U/L — ABNORMAL LOW (ref 15–41)
Albumin: 3.5 g/dL (ref 3.5–5.0)
Alkaline Phosphatase: 60 U/L (ref 38–126)
Anion gap: 6 (ref 5–15)
BUN: 13 mg/dL (ref 8–23)
CO2: 29 mmol/L (ref 22–32)
Calcium: 8.9 mg/dL (ref 8.9–10.3)
Chloride: 105 mmol/L (ref 98–111)
Creatinine: 1.12 mg/dL (ref 0.61–1.24)
GFR, Estimated: >60 mL/min (ref >60)
Glucose, Bld: 114 mg/dL — ABNORMAL HIGH (ref 70–99)
Potassium: 4.2 mmol/L (ref 3.5–5.1)
Sodium: 140 mmol/L (ref 135–145)
Total Bilirubin: 0.4 mg/dL (ref 0.3–1.2)
Total Protein: 5.9 g/dL — ABNORMAL LOW (ref 6.5–8.1)

## 2022-01-07 LAB — CBC WITH DIFFERENTIAL (CANCER CENTER ONLY)
Abs Immature Granulocytes: 0.04 10*3/uL (ref 0.00–0.07)
Basophils Absolute: 0.1 10*3/uL (ref 0.0–0.1)
Basophils Relative: 1 %
Eosinophils Absolute: 0.4 10*3/uL (ref 0.0–0.5)
Eosinophils Relative: 5 %
HCT: 41.5 % (ref 39.0–52.0)
Hemoglobin: 14.1 g/dL (ref 13.0–17.0)
Immature Granulocytes: 1 %
Lymphocytes Relative: 6 %
Lymphs Abs: 0.4 10*3/uL — ABNORMAL LOW (ref 0.7–4.0)
MCH: 35.4 pg — ABNORMAL HIGH (ref 26.0–34.0)
MCHC: 34 g/dL (ref 30.0–36.0)
MCV: 104.3 fL — ABNORMAL HIGH (ref 80.0–100.0)
Monocytes Absolute: 0.9 10*3/uL (ref 0.1–1.0)
Monocytes Relative: 11 %
Neutro Abs: 6 10*3/uL (ref 1.7–7.7)
Neutrophils Relative %: 76 %
Platelet Count: 233 10*3/uL (ref 150–400)
RBC: 3.98 MIL/uL — ABNORMAL LOW (ref 4.22–5.81)
RDW: 12.8 % (ref 11.5–15.5)
WBC Count: 7.8 10*3/uL (ref 4.0–10.5)
nRBC: 0 % (ref 0.0–0.2)

## 2022-01-07 NOTE — Progress Notes (Signed)
Hematology and Oncology Follow Up   [name] [id number] May 04, 1941 81 y.o. 01/07/2022 2:55 PM [name], MDFusco, Purcell Nails, MD       Principle Diagnosis: 81 year old man with bladder cancer diagnosed in 2018.  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  This tumor found to have PDL 1 positive  with CPS score over 90%.    Prior Therapy:    He is status post neoadjuvant chemotherapy utilizing gemcitabine and cisplatin started in September 2018.  He received day 1 of cycle 1 on 05/02/2017 and therapy discontinued after that.   He is status post laparoscopic Cystoprostatectomy and bilateral lymphadenectomy done by Dr. Tresa Moore on 06/29/2017. Final pathology showed a T3bN0 disease.  He has 13 lymph nodes sampled without any evidence of malignancy.   He completed radiation therapy to the pelvis on January 16, 2018 under the care of Dr. Tammi Klippel.     Carboplatin and gemcitabine he is status post 6 cycles of therapy completed on 05/22/2018.   Pembrolizumab 200 mg every 3 weeks started on 06/21/2018.  He completed 8 cycles of therapy and April 2020.  Therapy discontinued because of dermatological toxicities.   He is status post radiation therapy to the [name] paratracheal lymph node for a total of 50 Gray in 10 fractions completed on April 23, 2020.  He is status post definitive radiation to intrathoracic lymph node completed on November 27, 2021.  He received 40 Gray in 10 fractions.    Current therapy:  Active surveillance.   Interim History: [name] returns today for a follow-up visit.  Since the last visit, he completed radiation therapy to the intrathoracic lymph node in April.  He was hospitalized for urinary tract infection which has resolved at this time.  He denies any fevers or chills or sweats.  He is ambulating without any major difficulties.  He does report some fatigue and residual hip pain which is chronic in  nature.   Medications: Updated on review. Current Outpatient Medications  Medication Sig Dispense Refill   acetaminophen (TYLENOL) 500 MG tablet Take 500 mg by mouth every 6 (six) hours as needed.     clotrimazole-betamethasone (LOTRISONE) cream Apply 1 application topically in the morning and at bedtime.     HYDROmorphone (DILAUDID) 4 MG tablet Take 1 tablet (4 mg total) by mouth every 4 (four) hours as needed for severe pain. 120 tablet 0   lactose free nutrition (BOOST) LIQD Take 237 mLs by mouth 3 (three) times daily between meals.      levothyroxine (SYNTHROID) 50 MCG tablet Take 50 mcg by mouth daily.     oxyCODONE (OXY IR/ROXICODONE) 5 MG immediate release tablet Take 1-2 tablets (5-10 mg total) by mouth every 4 (four) hours as needed for moderate pain or severe pain. 120 tablet 0   polyethylene glycol (MIRALAX / GLYCOLAX) 17 g packet Take 17 g by mouth daily as needed for mild constipation. 14 each 0   Current Facility-Administered Medications  Medication Dose Route Frequency Provider Last Rate Last Admin   omalizumab [name]) prefilled syringe 300 mg  300 mg Subcutaneous Q14 Days Valentina Shaggy, MD   300 mg at 01/04/22 6803     Allergies:  Allergies  Allergen Reactions   Ciprofloxacin Hives   Cefepime Rash   Zofran [Ondansetron] Rash   Physical exam:    Blood pressure 129/71, pulse 84, temperature 97.8 F (36.6 C), temperature source Temporal, resp. rate 18, height '5\' 11"'$  (1.803 m),  weight 207 lb (93.9 kg), SpO2 100 %.    ECOG 1     General appearance: Comfortable appearing without any discomfort Head: Normocephalic without any trauma Oropharynx: Mucous membranes are moist and pink without any thrush or ulcers. Eyes: Pupils are equal and round reactive to light. Lymph nodes: Left soft axillary adenopathy palpated with a soft tissue mass noted as well. Heart:regular rate and rhythm.  S1 and S2 without leg edema. Lung: Clear without any rhonchi or wheezes.   No dullness to percussion. Abdomin: Soft, nontender, nondistended with good bowel sounds.  No hepatosplenomegaly. Musculoskeletal: No joint deformity or effusion.  Full range of motion noted. Neurological: No deficits noted on motor, sensory and deep tendon reflex exam. Skin: No petechial rash or dryness.  Appeared moist.            Lab Results: Lab Results  Component Value Date   WBC 7.8 01/07/2022   HGB 14.1 01/07/2022   HCT 41.5 01/07/2022   MCV 104.3 (H) 01/07/2022   PLT 233 01/07/2022     Chemistry      Component Value Date/Time   NA 140 12/24/2021 0002   NA 140 04/30/2019 1033   NA 141 05/23/2017 0828   K 4.5 12/24/2021 0002   K 4.0 05/23/2017 0828   CL 108 12/24/2021 0002   CO2 25 12/24/2021 0002   CO2 27 05/23/2017 0828   BUN 15 12/24/2021 0002   BUN 16 04/30/2019 1033   BUN 12.5 05/23/2017 0828   CREATININE 1.10 12/24/2021 0002   CREATININE 1.13 10/07/2021 1104   CREATININE 0.8 05/23/2017 0828      Component Value Date/Time   CALCIUM 8.6 (L) 12/24/2021 0002   CALCIUM 9.0 05/23/2017 0828   ALKPHOS 41 12/07/2021 0821   ALKPHOS 57 05/23/2017 0828   AST 22 12/07/2021 0821   AST 9 (L) 10/07/2021 1104   AST 13 05/23/2017 0828   ALT 12 12/07/2021 0821   ALT 9 10/07/2021 1104   ALT 15 05/23/2017 0828   BILITOT 1.4 (H) 12/07/2021 0821   BILITOT 0.5 10/07/2021 1104   BILITOT 0.37 05/23/2017 0828            Impression and Plan:  81 year old man with:    1.   Stage IV high-grade urothelial carcinoma of the bladder with pulmonary and bone disease diagnosed in 2018   The natural course of his disease was reviewed and treatment options were discussed.  PET scan obtained on October 22, 2021 was personally reviewed.  He continues to be on active surveillance at this time and systemic treatment options has been deferred due to patient's preference.  Restarting immunotherapy versus antibody drug conjugate options were reviewed and will be deferred for the time  being.  I will update his staging scan in August 2023.    2. Hip pain: Related to metastatic disease to the bone.  He is currently on oxycodone and Dilaudid and manageable pain level.   3.  Intrathoracic lymphadenopathy: He received radiation therapy for salvage options and will be reevaluated with the next imaging studies.   4.  Follow-up: He will return in August 2023 for repeat evaluation.   30  minutes were dedicated to this visit.  The time was spent on reviewing laboratory data, imaging studies, treatment choices and future plan of care review.   Zola Button, MD 01/07/2022 2:55 PM

## 2022-01-08 ENCOUNTER — Other Ambulatory Visit: Payer: Self-pay | Admitting: Oncology

## 2022-01-08 DIAGNOSIS — C679 Malignant neoplasm of bladder, unspecified: Secondary | ICD-10-CM

## 2022-01-13 DIAGNOSIS — I10 Essential (primary) hypertension: Secondary | ICD-10-CM | POA: Diagnosis not present

## 2022-01-13 DIAGNOSIS — G894 Chronic pain syndrome: Secondary | ICD-10-CM | POA: Diagnosis not present

## 2022-01-15 ENCOUNTER — Encounter: Payer: Self-pay | Admitting: Urology

## 2022-01-15 ENCOUNTER — Ambulatory Visit (INDEPENDENT_AMBULATORY_CARE_PROVIDER_SITE_OTHER): Payer: Medicare Other | Admitting: Urology

## 2022-01-15 VITALS — BP 117/70 | HR 91 | Ht 71.0 in | Wt 207.0 lb

## 2022-01-15 DIAGNOSIS — N12 Tubulo-interstitial nephritis, not specified as acute or chronic: Secondary | ICD-10-CM

## 2022-01-15 DIAGNOSIS — L501 Idiopathic urticaria: Secondary | ICD-10-CM | POA: Diagnosis not present

## 2022-01-15 DIAGNOSIS — N390 Urinary tract infection, site not specified: Secondary | ICD-10-CM | POA: Diagnosis not present

## 2022-01-15 LAB — MICROSCOPIC EXAMINATION: Renal Epithel, UA: NONE SEEN /hpf

## 2022-01-15 LAB — URINALYSIS, ROUTINE W REFLEX MICROSCOPIC
Bilirubin, UA: NEGATIVE
Glucose, UA: NEGATIVE
Ketones, UA: NEGATIVE
Nitrite, UA: POSITIVE — AB
Protein,UA: NEGATIVE
Specific Gravity, UA: 1.02 (ref 1.005–1.030)
Urobilinogen, Ur: 0.2 mg/dL (ref 0.2–1.0)
pH, UA: 6.5 (ref 5.0–7.5)

## 2022-01-15 MED ORDER — NITROFURANTOIN MACROCRYSTAL 50 MG PO CAPS: 50.0000 mg | ORAL_CAPSULE | Freq: Every day | ORAL | 11 refills | Status: DC

## 2022-01-15 NOTE — Patient Instructions (Signed)
Pyelonephritis, Adult Pyelonephritis is an infection that occurs in the kidney. The kidneys are the organs that filter a person's blood and move waste out of the bloodstream and into the urine. Urine passes from the kidneys, through tubes called ureters, and into the bladder. There are two main types of pyelonephritis: Infections that come on quickly without any warning (acute pyelonephritis). Infections that last for a long period of time (chronic pyelonephritis). In most cases, the infection clears up with treatment and does not cause further problems. More severe infections or chronic infections can sometimes spread to the bloodstream or lead to other problems with the kidneys. What are the causes? This condition is usually caused by: Bacteria traveling from the bladder up to the kidney. This may occur after having a bladder infection (cystitis) or urinary tract infection (UTI). Bladder infections caused from bacteria traveling from the bloodstream to the kidney. What increases the risk? This condition is more likely to develop in: Pregnant women. Older people. People who have any of these conditions: Diabetes. Inflammation of the prostate gland (prostatitis), in males. Kidney stones or bladder stones. Other abnormalities of the kidney or ureter. Cancer. People who have a catheter placed in the bladder. People who are sexually active. Women who use spermicides. People who have had a prior UTI. What are the signs or symptoms? Symptoms of this condition include: Frequent urination. Strong or persistent urge to urinate. Burning or stinging when urinating. Abdominal pain. Back pain. Pain in the side or flank area. Fever or chills. Blood in the urine, or dark urine. Nausea or vomiting. How is this diagnosed? This condition may be diagnosed based on: Your medical history and a physical exam. Urine tests. Blood tests. You may also have imaging tests of the kidneys, such as an  ultrasound or CT scan. How is this treated? Treatment for this condition may depend on the severity of the infection. If the infection is mild and is found early, you may be treated with antibiotic medicines taken by mouth (orally). You will need to drink fluids to remain hydrated. If the infection is more severe, you may need to stay in the hospital and receive antibiotics given directly into a vein through an IV. You may also need to receive fluids through an IV if you are not able to remain hydrated. After your hospital stay, you may need to take oral antibiotics for a period of time. Other treatments may be required, depending on the cause of the infection. Follow these instructions at home: Medicines Take your antibiotic medicine as told by your health care provider. Do not stop taking the antibiotic even if you start to feel better. Take over-the-counter and prescription medicines only as told by your health care provider. General instructions  Drink enough fluid to keep your urine pale yellow. Avoid caffeine, tea, and carbonated beverages. They tend to irritate the bladder. Urinate often. Avoid holding in urine for long periods of time. Urinate before and after sex. After a bowel movement, women should cleanse from front to back. Use each tissue only once. Keep all follow-up visits as told by your health care provider. This is important. Contact a health care provider if: Your symptoms do not get better after 2 days of treatment. Your symptoms get worse. You have a fever. Get help right away if you: Are unable to take your antibiotics or fluids. Have shaking chills. Vomit. Have severe flank or back pain. Have extreme weakness or fainting. Summary Pyelonephritis is a urinary tract infection (  UTI) that occurs in the kidney. Treatment for this condition may depend on the severity of the infection. Take your antibiotic medicine as told by your health care provider. Do not stop  taking the antibiotic even if you start to feel better. Drink enough fluid to keep your urine pale yellow. Keep all follow-up visits as told by your health care provider. This is important. This information is not intended to replace advice given to you by your health care provider. Make sure you discuss any questions you have with your health care provider. Document Revised: 03/12/2021 Document Reviewed: 03/12/2021 [employer] Patient Education  2023 [employer]  

## 2022-01-15 NOTE — Progress Notes (Signed)
01/15/2022 10:57 AM   [name] [birth year] [id number]  Referring provider: [name], MD [address] [place],  [place] [address]  Recurrent UTI   HPI: Mr Stuck is a 81yo here for evaluation of recurrent UTI. He has a history of radical cystectomy in 2018 with Dr. Tresa Moore for muscle invasive bladder cancer. He is currently followed by Dr. Delton Coombes for metastatic bladder cancer. Over the past year he has had multiple febrile UTIs. He was recently hospitalized for pyelonephritis. He has an ileal conduit. Previous urine cultures she multiple species.    PMH: Past Medical History:  Diagnosis Date   Bladder cancer ([place])    BPH (benign prostatic hypertrophy)    Dysuria    GERD (gastroesophageal reflux disease)    History of adenomatous polyp of colon    tubular adenoma 06/ 2018   History of bladder cancer 12/2014   urologist-  dr [name]--  dx High Grade TCC without stromal invasion s/p TURBT and intravesical BCG tx/  recurrent 07/2015 (pTa) TCC  repear intravesical BCG tx    History of hiatal hernia    History of urethral stricture    Nocturia     Surgical History: Past Surgical History:  Procedure Laterality Date   CARDIAC CATHETERIZATION  1997   normal coronary arteries   CARDIOVASCULAR STRESS TEST  12-14-2005   Low risk perfusion study/  very small scar in the inferior septum from mid ventricle to apex,  no ischemia/  mild inferior septal hypokinesis/  ef 58%   CATARACT EXTRACTION W/ INTRAOCULAR LENS  IMPLANT, BILATERAL  2011   COLONOSCOPY  last one 06/ 2018   CYSTOSCOPY WITH BIOPSY N/A 08/12/2015   Procedure: CYSTOSCOPY WITH BIOPSY AND FULGERATION;  Surgeon: [name], MD;  Location: [facility];  Service: Urology;  Laterality: N/A;   CYSTOSCOPY WITH INJECTION N/A 06/29/2017   Procedure: CYSTOSCOPY WITH INJECTION OF INDOCYANINE GREEN DYE;  Surgeon: Alexis Frock, MD;  Location: [facility];  Service: Urology;  Laterality: N/A;    CYSTOSCOPY WITH URETHRAL DILATATION N/A 03/21/2017   Procedure: CYSTOSCOPY;  Surgeon: [name], MD;  Location: [facility];  Service: Urology;  Laterality: N/A;   ESOPHAGOGASTRODUODENOSCOPY  12-05-2014   IR IMAGING GUIDED PORT INSERTION  02/07/2018   IR NEPHROSTOMY PLACEMENT LEFT  12/25/2021   IR REMOVAL TUN ACCESS W/ PORT W/O FL MOD SED  04/14/2018   TRANSTHORACIC ECHOCARDIOGRAM  01-31-2008   nomral LVF,  ef 55-65%/  mild pulmonic stenosis without regurg.,  peak transpulomonic valve gradient 23mHg   TRANSURETHRAL RESECTION OF BLADDER TUMOR N/A 12/31/2014   Procedure: TRANSURETHRAL RESECTION OF BLADDER TUMOR (TURBT);  Surgeon: MLowella Bandy MD;  [facility]  Service: Urology;  Laterality: N/A;   TRANSURETHRAL RESECTION OF BLADDER TUMOR N/A 03/21/2017   Procedure: POSSIBLE TRANSURETHRAL RESECTION OF BLADDER TUMOR (TURBT);  Surgeon: BNickie Retort MD;  [facility]  Service: Urology;  Laterality: N/A;    Home Medications:  Allergies as of 01/15/2022       Reactions   Ciprofloxacin Hives   Cefepime Rash   Zofran [ondansetron] Rash        Medication List        Accurate as of January 15, 2022 10:57 AM. If you have any questions, ask your nurse or doctor.          acetaminophen 500 MG tablet Commonly known as: TYLENOL Take 500 mg by mouth every 6 (six) hours  as needed.   clotrimazole-betamethasone cream Commonly known as: LOTRISONE Apply 1 application topically in the morning and at bedtime.   HYDROmorphone 4 MG tablet Commonly known as: DILAUDID Take 1 tablet (4 mg total) by mouth every 4 (four) hours as needed for severe pain.   lactose free nutrition Liqd Take 237 mLs by mouth 3 (three) times daily between meals.   levothyroxine 50 MCG tablet Commonly known as: SYNTHROID Take 50 mcg by mouth daily.   oxyCODONE 5 MG immediate release tablet Commonly known as: Oxy IR/ROXICODONE TAKE 1-2 TABLETS BY  MOUTH EVERY (4) HOURS AS NEEDED.   polyethylene glycol 17 g packet Commonly known as: MIRALAX / GLYCOLAX Take 17 g by mouth daily as needed for mild constipation.        Allergies:  Allergies  Allergen Reactions   Ciprofloxacin Hives   Cefepime Rash   Zofran [Ondansetron] Rash    Family History: Family History  Problem Relation Age of Onset   Alcoholism Father    Colon cancer Neg Hx    Colon polyps Neg Hx    Diabetes Neg Hx    Kidney disease Neg Hx    Esophageal cancer Neg Hx    Heart disease Neg Hx    Gallbladder disease Neg Hx    Allergic rhinitis Neg Hx    Angioedema Neg Hx    Asthma Neg Hx    Atopy Neg Hx    Eczema Neg Hx    Immunodeficiency Neg Hx    Urticaria Neg Hx     Social History:  reports that he quit smoking about 33 years ago. His smoking use included cigarettes. He has a 34.00 pack-year smoking history. He has never used smokeless tobacco. He reports current alcohol use of about 6.0 standard drinks per week. He reports that he does not use drugs.  ROS: All other review of systems were reviewed and are negative except what is noted above in HPI  Physical Exam: BP 117/70   Pulse 91   Ht '5\' 11"'$  (1.803 m)   Wt 207 lb (93.9 kg)   BMI 28.87 kg/m   Constitutional:  Alert and oriented, No acute distress. HEENT: [place] AT, moist mucus membranes.  Trachea midline, no masses. Cardiovascular: No clubbing, cyanosis, or edema. Respiratory: Normal respiratory effort, no increased work of breathing. GI: Abdomen is soft, nontender, nondistended, no abdominal masses GU: No CVA tenderness.  Lymph: No cervical or inguinal lymphadenopathy. Skin: No rashes, bruises or suspicious lesions. Neurologic: Grossly intact, no focal deficits, moving all 4 extremities. Psychiatric: Normal mood and affect.  Laboratory Data: Lab Results  Component Value Date   WBC 7.8 01/07/2022   HGB 14.1 01/07/2022   HCT 41.5 01/07/2022   MCV 104.3 (H) 01/07/2022   PLT 233 01/07/2022     Lab Results  Component Value Date   CREATININE 1.12 01/07/2022    No results found for: PSA  No results found for: TESTOSTERONE  Lab Results  Component Value Date   HGBA1C 5.1 06/16/2020    Urinalysis    Component Value Date/Time   COLORURINE STRAW (A) 12/23/2021 1639   APPEARANCEUR CLEAR 12/23/2021 1639   LABSPEC 1.015 12/23/2021 1639   PHURINE 6.5 12/23/2021 1639   GLUCOSEU NEGATIVE 12/23/2021 1639   [place] (A) 12/23/2021 1639   BILIRUBINUR NEGATIVE 12/23/2021 [place] 12/23/2021 1639   PROTEINUR 100 (A) 12/23/2021 1639   NITRITE POSITIVE (A) 12/23/2021 1639   LEUKOCYTESUR LARGE (A) 12/23/2021 1639  Lab Results  Component Value Date   BACTERIA FEW (A) 12/23/2021    Pertinent Imaging: Ct 12/25/2021: Images reviewed and discussed with the patient  No results found for this or any previous visit.  Results for orders placed during the hospital encounter of 01/25/18  US Venous Img Lower Bilateral  Narrative CLINICAL DATA:  Bilateral pain and edema  EXAM: BILATERAL LOWER EXTREMITY VENOUS DOPPLER ULTRASOUND  TECHNIQUE: Gray-scale sonography with graded compression, as well as color Doppler and duplex ultrasound were performed to evaluate the lower extremity deep venous systems from the level of the common femoral vein and including the common femoral, femoral, profunda femoral, popliteal and calf veins including the posterior tibial, peroneal and gastrocnemius veins when visible. The superficial great saphenous vein was also interrogated. Spectral Doppler was utilized to evaluate flow at rest and with distal augmentation maneuvers in the common femoral, femoral and popliteal veins.  COMPARISON:  None.  FINDINGS: RIGHT LOWER EXTREMITY  Common Femoral Vein: No evidence of thrombus. Normal compressibility, respiratory phasicity and response to augmentation.  Saphenofemoral Junction: No evidence of thrombus.  Normal compressibility and flow on color Doppler imaging.  Profunda Femoral Vein: No evidence of thrombus. Normal compressibility and flow on color Doppler imaging.  Femoral Vein: No evidence of thrombus. Normal compressibility, respiratory phasicity and response to augmentation.  Popliteal Vein: No evidence of thrombus. Normal compressibility, respiratory phasicity and response to augmentation.  Calf Veins: No evidence of thrombus. Normal compressibility and flow on color Doppler imaging.  Superficial Great Saphenous Vein: No evidence of thrombus. Normal compressibility.  Venous Reflux:  None.  Other Findings:  None.  LEFT LOWER EXTREMITY  Common Femoral Vein: No evidence of thrombus. Normal compressibility, respiratory phasicity and response to augmentation.  Saphenofemoral Junction: No evidence of thrombus. Normal compressibility and flow on color Doppler imaging.  Profunda Femoral Vein: No evidence of thrombus. Normal compressibility and flow on color Doppler imaging.  Femoral Vein: No evidence of thrombus. Normal compressibility, respiratory phasicity and response to augmentation.  Popliteal Vein: No evidence of thrombus. Normal compressibility, respiratory phasicity and response to augmentation.  Calf Veins: No evidence of thrombus. Normal compressibility and flow on color Doppler imaging.  Superficial Great Saphenous Vein: No evidence of thrombus. Normal compressibility.  Venous Reflux:  None.  Other Findings:  None.  IMPRESSION: No evidence of deep venous thrombosis.   Electronically Signed By: Jerilynn Mages.  Shick M.D. On: 01/27/2018 11:02  No results found for this or any previous visit.  No results found for this or any previous visit.  Results for orders placed during the hospital encounter of 01/25/18  US RENAL  Narrative CLINICAL DATA:  UTI, fever  EXAM: RENAL / URINARY TRACT ULTRASOUND COMPLETE  COMPARISON:  CT 11/25/2017  FINDINGS: Right  Kidney:  Length: 11.3 cm. Normal echotexture. No hydronephrosis. 2.3 cm cyst in the midpole.  Left Kidney:  Length: 11.3 cm. Echogenicity within normal limits. No mass or hydronephrosis visualized.  Bladder:  Prior cystectomy.  IMPRESSION: No hydronephrosis or acute findings.   Electronically Signed By: Rolm Baptise M.D. On: 01/27/2018 10:56  No results found for this or any previous visit.  No results found for this or any previous visit.  Results for orders placed during the hospital encounter of 12/23/21  CT RENAL STONE STUDY  Narrative CLINICAL DATA:  History of bladder cancer with ileal conduit creation now with concern for left-sided ureteral obstruction  Plan was to proceed with left-sided nephrostomy tube placement however sonographic evaluation demonstrated apparent resolution  of previously noted left-sided pelvicaliectasis.  Please perform noncontrast CT scan the abdomen pelvis for further evaluation.  EXAM: CT ABDOMEN AND PELVIS WITHOUT CONTRAST  TECHNIQUE: Multidetector CT imaging of the abdomen and pelvis was performed following the standard protocol without IV contrast.  RADIATION DOSE REDUCTION: This exam was performed according to the departmental dose-optimization program which includes automated exposure control, adjustment of the mA and/or kV according to patient size and/or use of iterative reconstruction technique.  COMPARISON:  12/23/2021; 12/07/2021; 01/24/2019  FINDINGS: Lower chest: Limited visualization of the lower thorax redemonstrates trace bilateral effusions and associated bibasilar opacities, right slightly greater than left.  Normal heart size. Calcifications involving the mitral valve annulus, incompletely evaluated. No pericardial effusion.  Hepatobiliary: Normal hepatic contour. Previously characterized hepatic cysts are incompletely characterized on present examination though morphologically similar to the  01/24/2019 examination.  Pancreas: Normal noncontrast appearance of the pancreas.  Spleen: Normal noncontrast appearance of the spleen. There is a punctate splenule about the anterior aspect of the spleen.  Adrenals/Urinary Tract: Previously noted left-sided caliectasis has resolved in the interval. Left-sided extrarenal pelvis is similar to remote examination performed 01/24/2019 while right-sided extrarenal pelvis is similar to the 12/07/2021 examination. No renal stones. No renal stones are seen along the expected course of either ureter to the level of the right lower quadrant ileal conduit. There is a minimal amount of residual asymmetric left-sided perinephric stranding however this appears similar to the 11/2021 examination.  Similar appearance of right lower quadrant ileal conduit without evidence of parastomal hernia.  Normal noncontrast appearance of the bilateral adrenal glands.  Stomach/Bowel: Small hiatal hernia. Large colonic stool burden without evidence of enteric obstruction. Small bowel anastomosis within the right lower abdominal quadrant without evidence of enteric obstruction. Normal appearance of the terminal ileum. The appendix is not visualized and presumably surgically absent. No pneumoperitoneum, pneumatosis or portal venous gas.  Vascular/Lymphatic: Scattered atherosclerotic plaque within normal caliber abdominal aorta.  No bulky retroperitoneal, mesenteric, pelvic or inguinal lymphadenopathy.  Reproductive: The prostate is presumably surgically absent.  Other: Tiny mesenteric fat containing periumbilical hernia.  Musculoskeletal: Lytic lesion involving the right inferior pubic rami and ischium appear similar to PET-CT performed 10/22/2021 measuring approximately 5.1 x 3.4 cm (image 90, series 3). Old/healed right 10th rib fracture.  IMPRESSION: 1. Interval resolution of previously noted left-sided pelvicaliectasis, the etiology of which is not  depicted on this examination, potentially passes of a mucus plug associated with the right lower abdominal ileal conduit. 2. Small residual bilateral extrarenal pelvis ease, the left-sided which is stable since at least the 2020 examination. 3. Similar appearance of known lytic lesion involving the right inferior pubic rami and ischium. 4. Small hiatal hernia. 5. Aortic Atherosclerosis (ICD10-I70.0).   Electronically Signed By: Sandi Mariscal M.D. On: 12/25/2021 13:27   Assessment & Plan:    1. Recurrent UTI -we will trial macrobid '50mg'$  qhs     No follow-ups on file.  Nicolette Bang, MD  [facility] [place]

## 2022-01-18 ENCOUNTER — Ambulatory Visit (INDEPENDENT_AMBULATORY_CARE_PROVIDER_SITE_OTHER): Payer: Medicare Other

## 2022-01-18 DIAGNOSIS — L501 Idiopathic urticaria: Secondary | ICD-10-CM

## 2022-01-29 DIAGNOSIS — L501 Idiopathic urticaria: Secondary | ICD-10-CM | POA: Diagnosis not present

## 2022-02-01 ENCOUNTER — Ambulatory Visit (INDEPENDENT_AMBULATORY_CARE_PROVIDER_SITE_OTHER): Payer: Medicare Other

## 2022-02-01 DIAGNOSIS — L501 Idiopathic urticaria: Secondary | ICD-10-CM | POA: Diagnosis not present

## 2022-02-04 ENCOUNTER — Telehealth: Payer: Self-pay | Admitting: Oncology

## 2022-02-04 ENCOUNTER — Encounter: Payer: Self-pay | Admitting: Infectious Diseases

## 2022-02-04 NOTE — Telephone Encounter (Signed)
Called patient regarding upcoming August appointment, patient is notified. 

## 2022-02-12 DIAGNOSIS — L501 Idiopathic urticaria: Secondary | ICD-10-CM | POA: Diagnosis not present

## 2022-02-17 ENCOUNTER — Ambulatory Visit (INDEPENDENT_AMBULATORY_CARE_PROVIDER_SITE_OTHER): Payer: Medicare Other

## 2022-02-17 DIAGNOSIS — L501 Idiopathic urticaria: Secondary | ICD-10-CM

## 2022-02-25 DIAGNOSIS — I10 Essential (primary) hypertension: Secondary | ICD-10-CM | POA: Diagnosis not present

## 2022-02-25 DIAGNOSIS — C679 Malignant neoplasm of bladder, unspecified: Secondary | ICD-10-CM | POA: Diagnosis not present

## 2022-02-25 DIAGNOSIS — J449 Chronic obstructive pulmonary disease, unspecified: Secondary | ICD-10-CM | POA: Diagnosis not present

## 2022-02-25 DIAGNOSIS — E663 Overweight: Secondary | ICD-10-CM | POA: Diagnosis not present

## 2022-02-25 DIAGNOSIS — N390 Urinary tract infection, site not specified: Secondary | ICD-10-CM | POA: Diagnosis not present

## 2022-02-25 DIAGNOSIS — Z6828 Body mass index (BMI) 28.0-28.9, adult: Secondary | ICD-10-CM | POA: Diagnosis not present

## 2022-02-26 DIAGNOSIS — L501 Idiopathic urticaria: Secondary | ICD-10-CM | POA: Diagnosis not present

## 2022-03-01 ENCOUNTER — Ambulatory Visit (INDEPENDENT_AMBULATORY_CARE_PROVIDER_SITE_OTHER): Payer: Medicare Other

## 2022-03-01 DIAGNOSIS — L501 Idiopathic urticaria: Secondary | ICD-10-CM

## 2022-03-03 ENCOUNTER — Ambulatory Visit (INDEPENDENT_AMBULATORY_CARE_PROVIDER_SITE_OTHER): Payer: Medicare Other | Admitting: Urology

## 2022-03-03 ENCOUNTER — Encounter: Payer: Self-pay | Admitting: Urology

## 2022-03-03 VITALS — BP 122/77 | HR 111

## 2022-03-03 DIAGNOSIS — N390 Urinary tract infection, site not specified: Secondary | ICD-10-CM | POA: Diagnosis not present

## 2022-03-03 LAB — URINALYSIS, ROUTINE W REFLEX MICROSCOPIC
Bilirubin, UA: NEGATIVE
Glucose, UA: NEGATIVE
Ketones, UA: NEGATIVE
Leukocytes,UA: NEGATIVE
Nitrite, UA: POSITIVE — AB
Specific Gravity, UA: 1.02 (ref 1.005–1.030)
Urobilinogen, Ur: 0.2 mg/dL (ref 0.2–1.0)
pH, UA: 6.5 (ref 5.0–7.5)

## 2022-03-03 LAB — MICROSCOPIC EXAMINATION
Epithelial Cells (non renal): NONE SEEN /hpf (ref 0–10)
Renal Epithel, UA: NONE SEEN /hpf
WBC, UA: NONE SEEN /hpf (ref 0–5)

## 2022-03-03 NOTE — Patient Instructions (Signed)
Urinary Tract Infection, Adult A urinary tract infection (UTI) is an infection of any part of the urinary tract. The urinary tract includes: The kidneys. The ureters. The bladder. The urethra. These organs make, store, and get rid of pee (urine) in the body. What are the causes? This infection is caused by germs (bacteria) in your genital area. These germs grow and cause swelling (inflammation) of your urinary tract. What increases the risk? The following factors may make you more likely to develop this condition: Using a small, thin tube (catheter) to drain pee. Not being able to control when you pee or poop (incontinence). Being male. If you are male, these things can increase the risk: Using these methods to prevent pregnancy: A medicine that kills sperm (spermicide). A device that blocks sperm (diaphragm). Having low levels of a male hormone (estrogen). Being pregnant. You are more likely to develop this condition if: You have genes that add to your risk. You are sexually active. You take antibiotic medicines. You have trouble peeing because of: A prostate that is bigger than normal, if you are male. A blockage in the part of your body that drains pee from the bladder. A kidney stone. A nerve condition that affects your bladder. Not getting enough to drink. Not peeing often enough. You have other conditions, such as: Diabetes. A weak disease-fighting system (immune system). Sickle cell disease. Gout. Injury of the spine. What are the signs or symptoms? Symptoms of this condition include: Needing to pee right away. Peeing small amounts often. Pain or burning when peeing. Blood in the pee. Pee that smells bad or not like normal. Trouble peeing. Pee that is cloudy. Fluid coming from the vagina, if you are male. Pain in the belly or lower back. Other symptoms include: Vomiting. Not feeling hungry. Feeling mixed up (confused). This may be the first symptom in  older adults. Being tired and grouchy (irritable). A fever. Watery poop (diarrhea). How is this treated? Taking antibiotic medicine. Taking other medicines. Drinking enough water. In some cases, you may need to see a specialist. Follow these instructions at home:  Medicines Take over-the-counter and prescription medicines only as told by your doctor. If you were prescribed an antibiotic medicine, take it as told by your doctor. Do not stop taking it even if you start to feel better. General instructions Make sure you: Pee until your bladder is empty. Do not hold pee for a long time. Empty your bladder after sex. Wipe from front to back after peeing or pooping if you are a male. Use each tissue one time when you wipe. Drink enough fluid to keep your pee pale yellow. Keep all follow-up visits. Contact a doctor if: You do not get better after 1-2 days. Your symptoms go away and then come back. Get help right away if: You have very bad back pain. You have very bad pain in your lower belly. You have a fever. You have chills. You feeling like you will vomit or you vomit. Summary A urinary tract infection (UTI) is an infection of any part of the urinary tract. This condition is caused by germs in your genital area. There are many risk factors for a UTI. Treatment includes antibiotic medicines. Drink enough fluid to keep your pee pale yellow. This information is not intended to replace advice given to you by your health care provider. Make sure you discuss any questions you have with your health care provider. Document Revised: 03/14/2020 Document Reviewed: 03/14/2020 Elsevier Patient Education    2023 [employer]  

## 2022-03-03 NOTE — Progress Notes (Signed)
03/03/2022 2:03 PM   [name] 04-14-1941 [id number]  Referring provider: [name], MD [address] [place],  [place] [address]  Recurrent UTI   HPI: [name] is a 81yo here for followup for recurrent UTI. He developed UTI symptoms 3 days ago and presented to his PCP and was prescribed bactrim. He was then switched to amoxicillin. He is feeling better. He is currently on macrodantin '50mg'$  qhs. He denies any hematuria. He had a fever of 102 with the UTI   PMH: Past Medical History:  Diagnosis Date   Bladder cancer ([place])    BPH (benign prostatic hypertrophy)    Dysuria    GERD (gastroesophageal reflux disease)    History of adenomatous polyp of colon    tubular adenoma 06/ 2018   History of bladder cancer 12/2014   urologist-  dr [name]--  dx High Grade TCC without stromal invasion s/p TURBT and intravesical BCG tx/  recurrent 07/2015 (pTa) TCC  repear intravesical BCG tx    History of hiatal hernia    History of urethral stricture    Nocturia     Surgical History: Past Surgical History:  Procedure Laterality Date   CARDIAC CATHETERIZATION  1997   normal coronary arteries   CARDIOVASCULAR STRESS TEST  12-14-2005   Low risk perfusion study/  very small scar in the inferior septum from mid ventricle to apex,  no ischemia/  mild inferior septal hypokinesis/  ef 58%   CATARACT EXTRACTION W/ INTRAOCULAR LENS  IMPLANT, BILATERAL  2011   COLONOSCOPY  last one 06/ 2018   CYSTOSCOPY WITH BIOPSY N/A 08/12/2015   Procedure: CYSTOSCOPY WITH BIOPSY AND FULGERATION;  Surgeon: [name], MD;  Location: [facility];  Service: Urology;  Laterality: N/A;   CYSTOSCOPY WITH INJECTION N/A 06/29/2017   Procedure: CYSTOSCOPY WITH INJECTION OF INDOCYANINE GREEN DYE;  Surgeon: Alexis Frock, MD;  Location: [facility];  Service: Urology;  Laterality: N/A;   CYSTOSCOPY WITH URETHRAL DILATATION N/A 03/21/2017   Procedure: CYSTOSCOPY;  Surgeon: [name],  MD;  Location: [facility];  Service: Urology;  Laterality: N/A;   ESOPHAGOGASTRODUODENOSCOPY  12-05-2014   IR IMAGING GUIDED PORT INSERTION  02/07/2018   IR NEPHROSTOMY PLACEMENT LEFT  12/25/2021   IR REMOVAL TUN ACCESS W/ PORT W/O FL MOD SED  04/14/2018   TRANSTHORACIC ECHOCARDIOGRAM  01-31-2008   nomral LVF,  ef 55-65%/  mild pulmonic stenosis without regurg.,  peak transpulomonic valve gradient 65mHg   TRANSURETHRAL RESECTION OF BLADDER TUMOR N/A 12/31/2014   Procedure: TRANSURETHRAL RESECTION OF BLADDER TUMOR (TURBT);  Surgeon: MLowella Bandy MD;  [facility]  Service: Urology;  Laterality: N/A;   TRANSURETHRAL RESECTION OF BLADDER TUMOR N/A 03/21/2017   Procedure: POSSIBLE TRANSURETHRAL RESECTION OF BLADDER TUMOR (TURBT);  Surgeon: BNickie Retort MD;  [facility]  Service: Urology;  Laterality: N/A;    Home Medications:  Allergies as of 03/03/2022       Reactions   Ciprofloxacin Hives   Cefepime Rash   Zofran [ondansetron] Rash        Medication List        Accurate as of March 03, 2022  2:03 PM. If you have any questions, ask your nurse or doctor.          acetaminophen 500 MG tablet Commonly known as: TYLENOL Take 500 mg by mouth every 6 (six) hours as needed.   clotrimazole-betamethasone cream Commonly known as:  LOTRISONE Apply 1 application topically in the morning and at bedtime.   HYDROmorphone 4 MG tablet Commonly known as: DILAUDID Take 1 tablet (4 mg total) by mouth every 4 (four) hours as needed for severe pain.   lactose free nutrition Liqd Take 237 mLs by mouth 3 (three) times daily between meals.   levothyroxine 50 MCG tablet Commonly known as: SYNTHROID Take 50 mcg by mouth daily.   nitrofurantoin 50 MG capsule Commonly known as: MACRODANTIN Take 1 capsule (50 mg total) by mouth at bedtime.   oxyCODONE 5 MG immediate release tablet Commonly known as: Oxy IR/ROXICODONE TAKE 1-2  TABLETS BY MOUTH EVERY (4) HOURS AS NEEDED.   polyethylene glycol 17 g packet Commonly known as: MIRALAX / GLYCOLAX Take 17 g by mouth daily as needed for mild constipation.        Allergies:  Allergies  Allergen Reactions   Ciprofloxacin Hives   Cefepime Rash   Zofran [Ondansetron] Rash    Family History: Family History  Problem Relation Age of Onset   Alcoholism Father    Colon cancer Neg Hx    Colon polyps Neg Hx    Diabetes Neg Hx    Kidney disease Neg Hx    Esophageal cancer Neg Hx    Heart disease Neg Hx    Gallbladder disease Neg Hx    Allergic rhinitis Neg Hx    Angioedema Neg Hx    Asthma Neg Hx    Atopy Neg Hx    Eczema Neg Hx    Immunodeficiency Neg Hx    Urticaria Neg Hx     Social History:  reports that he quit smoking about 33 years ago. His smoking use included cigarettes. He has a 34.00 pack-year smoking history. He has never used smokeless tobacco. He reports current alcohol use of about 6.0 standard drinks of alcohol per week. He reports that he does not use drugs.  ROS: All other review of systems were reviewed and are negative except what is noted above in HPI  Physical Exam: BP 122/77   Pulse (!) 111   Constitutional:  Alert and oriented, No acute distress. HEENT: [place] AT, moist mucus membranes.  Trachea midline, no masses. Cardiovascular: No clubbing, cyanosis, or edema. Respiratory: Normal respiratory effort, no increased work of breathing. GI: Abdomen is soft, nontender, nondistended, no abdominal masses GU: No CVA tenderness.  Lymph: No cervical or inguinal lymphadenopathy. Skin: No rashes, bruises or suspicious lesions. Neurologic: Grossly intact, no focal deficits, moving all 4 extremities. Psychiatric: Normal mood and affect.  Laboratory Data: Lab Results  Component Value Date   WBC 7.8 01/07/2022   HGB 14.1 01/07/2022   HCT 41.5 01/07/2022   MCV 104.3 (H) 01/07/2022   PLT 233 01/07/2022    Lab Results  Component Value Date    CREATININE 1.12 01/07/2022    No results found for: "PSA"  No results found for: "TESTOSTERONE"  Lab Results  Component Value Date   HGBA1C 5.1 06/16/2020    Urinalysis    Component Value Date/Time   COLORURINE STRAW (A) 12/23/2021 1639   APPEARANCEUR Hazy (A) 01/15/2022 1455   LABSPEC 1.015 12/23/2021 1639   PHURINE 6.5 12/23/2021 1639   GLUCOSEU Negative 01/15/2022 1455   HGBUR LARGE (A) 12/23/2021 1639   BILIRUBINUR Negative 01/15/2022 1455   KETONESUR NEGATIVE 12/23/2021 1639   PROTEINUR Negative 01/15/2022 1455   PROTEINUR 100 (A) 12/23/2021 1639   NITRITE Positive (A) 01/15/2022 1455   NITRITE POSITIVE (A) 12/23/2021 1639  LEUKOCYTESUR Trace (A) 01/15/2022 1455   LEUKOCYTESUR LARGE (A) 12/23/2021 1639    Lab Results  Component Value Date   LABMICR See below: 01/15/2022   WBCUA 6-10 (A) 01/15/2022   LABEPIT 0-10 01/15/2022   BACTERIA Many (A) 01/15/2022    Pertinent Imaging:  No results found for this or any previous visit.  Results for orders placed during the hospital encounter of 01/25/18  US Venous Img Lower Bilateral  Narrative CLINICAL DATA:  Bilateral pain and edema  EXAM: BILATERAL LOWER EXTREMITY VENOUS DOPPLER ULTRASOUND  TECHNIQUE: Gray-scale sonography with graded compression, as well as color Doppler and duplex ultrasound were performed to evaluate the lower extremity deep venous systems from the level of the common femoral vein and including the common femoral, femoral, profunda femoral, popliteal and calf veins including the posterior tibial, peroneal and gastrocnemius veins when visible. The superficial great saphenous vein was also interrogated. Spectral Doppler was utilized to evaluate flow at rest and with distal augmentation maneuvers in the common femoral, femoral and popliteal veins.  COMPARISON:  None.  FINDINGS: RIGHT LOWER EXTREMITY  Common Femoral Vein: No evidence of thrombus. Normal compressibility, respiratory  phasicity and response to augmentation.  Saphenofemoral Junction: No evidence of thrombus. Normal compressibility and flow on color Doppler imaging.  Profunda Femoral Vein: No evidence of thrombus. Normal compressibility and flow on color Doppler imaging.  Femoral Vein: No evidence of thrombus. Normal compressibility, respiratory phasicity and response to augmentation.  Popliteal Vein: No evidence of thrombus. Normal compressibility, respiratory phasicity and response to augmentation.  Calf Veins: No evidence of thrombus. Normal compressibility and flow on color Doppler imaging.  Superficial Great Saphenous Vein: No evidence of thrombus. Normal compressibility.  Venous Reflux:  None.  Other Findings:  None.  LEFT LOWER EXTREMITY  Common Femoral Vein: No evidence of thrombus. Normal compressibility, respiratory phasicity and response to augmentation.  Saphenofemoral Junction: No evidence of thrombus. Normal compressibility and flow on color Doppler imaging.  Profunda Femoral Vein: No evidence of thrombus. Normal compressibility and flow on color Doppler imaging.  Femoral Vein: No evidence of thrombus. Normal compressibility, respiratory phasicity and response to augmentation.  Popliteal Vein: No evidence of thrombus. Normal compressibility, respiratory phasicity and response to augmentation.  Calf Veins: No evidence of thrombus. Normal compressibility and flow on color Doppler imaging.  Superficial Great Saphenous Vein: No evidence of thrombus. Normal compressibility.  Venous Reflux:  None.  Other Findings:  None.  IMPRESSION: No evidence of deep venous thrombosis.   Electronically Signed By: Jerilynn Mages.  Shick M.D. On: 01/27/2018 11:02  No results found for this or any previous visit.  No results found for this or any previous visit.  Results for orders placed during the hospital encounter of 01/25/18  US RENAL  Narrative CLINICAL DATA:  UTI,  fever  EXAM: RENAL / URINARY TRACT ULTRASOUND COMPLETE  COMPARISON:  CT 11/25/2017  FINDINGS: Right Kidney:  Length: 11.3 cm. Normal echotexture. No hydronephrosis. 2.3 cm cyst in the midpole.  Left Kidney:  Length: 11.3 cm. Echogenicity within normal limits. No mass or hydronephrosis visualized.  Bladder:  Prior cystectomy.  IMPRESSION: No hydronephrosis or acute findings.   Electronically Signed By: Rolm Baptise M.D. On: 01/27/2018 10:56  No results found for this or any previous visit.  No results found for this or any previous visit.  Results for orders placed during the hospital encounter of 12/23/21  CT RENAL STONE STUDY  Narrative CLINICAL DATA:  History of bladder cancer with ileal conduit creation  now with concern for left-sided ureteral obstruction  Plan was to proceed with left-sided nephrostomy tube placement however sonographic evaluation demonstrated apparent resolution of previously noted left-sided pelvicaliectasis.  Please perform noncontrast CT scan the abdomen pelvis for further evaluation.  EXAM: CT ABDOMEN AND PELVIS WITHOUT CONTRAST  TECHNIQUE: Multidetector CT imaging of the abdomen and pelvis was performed following the standard protocol without IV contrast.  RADIATION DOSE REDUCTION: This exam was performed according to the departmental dose-optimization program which includes automated exposure control, adjustment of the mA and/or kV according to patient size and/or use of iterative reconstruction technique.  COMPARISON:  12/23/2021; 12/07/2021; 01/24/2019  FINDINGS: Lower chest: Limited visualization of the lower thorax redemonstrates trace bilateral effusions and associated bibasilar opacities, right slightly greater than left.  Normal heart size. Calcifications involving the mitral valve annulus, incompletely evaluated. No pericardial effusion.  Hepatobiliary: Normal hepatic contour. Previously characterized hepatic  cysts are incompletely characterized on present examination though morphologically similar to the 01/24/2019 examination.  Pancreas: Normal noncontrast appearance of the pancreas.  Spleen: Normal noncontrast appearance of the spleen. There is a punctate splenule about the anterior aspect of the spleen.  Adrenals/Urinary Tract: Previously noted left-sided caliectasis has resolved in the interval. Left-sided extrarenal pelvis is similar to remote examination performed 01/24/2019 while right-sided extrarenal pelvis is similar to the 12/07/2021 examination. No renal stones. No renal stones are seen along the expected course of either ureter to the level of the right lower quadrant ileal conduit. There is a minimal amount of residual asymmetric left-sided perinephric stranding however this appears similar to the 11/2021 examination.  Similar appearance of right lower quadrant ileal conduit without evidence of parastomal hernia.  Normal noncontrast appearance of the bilateral adrenal glands.  Stomach/Bowel: Small hiatal hernia. Large colonic stool burden without evidence of enteric obstruction. Small bowel anastomosis within the right lower abdominal quadrant without evidence of enteric obstruction. Normal appearance of the terminal ileum. The appendix is not visualized and presumably surgically absent. No pneumoperitoneum, pneumatosis or portal venous gas.  Vascular/Lymphatic: Scattered atherosclerotic plaque within normal caliber abdominal aorta.  No bulky retroperitoneal, mesenteric, pelvic or inguinal lymphadenopathy.  Reproductive: The prostate is presumably surgically absent.  Other: Tiny mesenteric fat containing periumbilical hernia.  Musculoskeletal: Lytic lesion involving the right inferior pubic rami and ischium appear similar to PET-CT performed 10/22/2021 measuring approximately 5.1 x 3.4 cm (image 90, series 3). Old/healed right 10th rib fracture.  IMPRESSION: 1.  Interval resolution of previously noted left-sided pelvicaliectasis, the etiology of which is not depicted on this examination, potentially passes of a mucus plug associated with the right lower abdominal ileal conduit. 2. Small residual bilateral extrarenal pelvis ease, the left-sided which is stable since at least the 2020 examination. 3. Similar appearance of known lytic lesion involving the right inferior pubic rami and ischium. 4. Small hiatal hernia. 5. Aortic Atherosclerosis (ICD10-I70.0).   Electronically Signed By: Sandi Mariscal M.D. On: 12/25/2021 13:27   Assessment & Plan:    1. Recurrent UTI -Continue amoxicillin. RTC 3 months - Urinalysis, Routine w reflex microscopic   No follow-ups on file.  Nicolette Bang, MD  [facility] [place]

## 2022-03-06 LAB — URINE CULTURE

## 2022-03-09 ENCOUNTER — Other Ambulatory Visit: Payer: Self-pay

## 2022-03-09 MED ORDER — FOSFOMYCIN TROMETHAMINE 3 G PO PACK: 3.0000 g | PACK | Freq: Once | ORAL | 1 refills | Status: AC

## 2022-03-09 NOTE — Telephone Encounter (Signed)
Allergy/ contradiction to Cipro (Hives) ok to still send?  Please advise.

## 2022-03-09 NOTE — Telephone Encounter (Signed)
-----   Message from Cleon Gustin, MD sent at 03/09/2022 10:50 AM EDT ----- Cipro '250mg'$  BID for 7 days ----- Message ----- From: Audie Box, CMA Sent: 03/08/2022   8:32 AM EDT To: Cleon Gustin, MD  Please review, pt taking macrodantin

## 2022-03-09 NOTE — Telephone Encounter (Signed)
Verbal from Dr. Alyson Ingles to send in fosfomycin 3g PO once and to repeat dose in 72 hours.  Rx sent to pharmacy with one refill to repeat in 72 hours.  Called patient to inform them of rx and to be sure he was aware to repeat dose in 72 hours.  Patient voiced understanding and instructed them to call back with any questions regarding the prescription.

## 2022-03-15 ENCOUNTER — Encounter (HOSPITAL_COMMUNITY)
Admission: RE | Admit: 2022-03-15 | Discharge: 2022-03-15 | Disposition: A | Discharge status: Home / Self Care | Payer: Medicare Other | Source: Ambulatory Visit | Attending: Oncology | Admitting: Oncology

## 2022-03-15 ENCOUNTER — Ambulatory Visit: Payer: Medicare Other

## 2022-03-15 DIAGNOSIS — I700 Atherosclerosis of aorta: Secondary | ICD-10-CM | POA: Diagnosis not present

## 2022-03-15 DIAGNOSIS — I2510 Atherosclerotic heart disease of native coronary artery without angina pectoris: Secondary | ICD-10-CM | POA: Insufficient documentation

## 2022-03-15 DIAGNOSIS — C679 Malignant neoplasm of bladder, unspecified: Secondary | ICD-10-CM | POA: Diagnosis not present

## 2022-03-15 DIAGNOSIS — C678 Malignant neoplasm of overlapping sites of bladder: Secondary | ICD-10-CM | POA: Diagnosis not present

## 2022-03-15 DIAGNOSIS — J90 Pleural effusion, not elsewhere classified: Secondary | ICD-10-CM | POA: Diagnosis not present

## 2022-03-15 DIAGNOSIS — R911 Solitary pulmonary nodule: Secondary | ICD-10-CM | POA: Insufficient documentation

## 2022-03-15 LAB — GLUCOSE, CAPILLARY: Glucose-Capillary: 98 mg/dL (ref 70–99)

## 2022-03-15 MED ORDER — FLUDEOXYGLUCOSE F - 18 (FDG) INJECTION
10.4000 | Freq: Once | INTRAVENOUS | Status: AC
  Administered 2022-03-15: 9.96 via INTRAVENOUS

## 2022-03-17 ENCOUNTER — Other Ambulatory Visit: Payer: Medicare Other

## 2022-03-17 ENCOUNTER — Ambulatory Visit (INDEPENDENT_AMBULATORY_CARE_PROVIDER_SITE_OTHER): Payer: Medicare Other

## 2022-03-17 ENCOUNTER — Telehealth: Payer: Self-pay

## 2022-03-17 DIAGNOSIS — L501 Idiopathic urticaria: Secondary | ICD-10-CM | POA: Diagnosis not present

## 2022-03-17 DIAGNOSIS — N390 Urinary tract infection, site not specified: Secondary | ICD-10-CM | POA: Diagnosis not present

## 2022-03-17 LAB — MICROSCOPIC EXAMINATION
Renal Epithel, UA: NONE SEEN /hpf
WBC, UA: NONE SEEN /hpf (ref 0–5)

## 2022-03-17 LAB — URINALYSIS, ROUTINE W REFLEX MICROSCOPIC
Bilirubin, UA: NEGATIVE
Glucose, UA: NEGATIVE
Ketones, UA: NEGATIVE
Leukocytes,UA: NEGATIVE
Nitrite, UA: NEGATIVE
Protein,UA: NEGATIVE
Specific Gravity, UA: 1.02 (ref 1.005–1.030)
Urobilinogen, Ur: 1 mg/dL (ref 0.2–1.0)
pH, UA: 6.5 (ref 5.0–7.5)

## 2022-03-17 MED ORDER — NITROFURANTOIN MONOHYD MACRO 100 MG PO CAPS: 100.0000 mg | ORAL_CAPSULE | Freq: Two times a day (BID) | ORAL | 0 refills | Status: DC

## 2022-03-17 NOTE — Addendum Note (Signed)
Addended by: Audie Box on: 03/17/2022 04:49 PM   Modules accepted: Orders

## 2022-03-17 NOTE — Telephone Encounter (Signed)
Patient dropped off a urine today.  Dr. Alyson Ingles gave verbal for patient to begin Macrobid '100mg'$  BID x7 days.  Rx sent to pharmacy, culture ordered and patient aware.

## 2022-03-22 ENCOUNTER — Telehealth: Payer: Self-pay

## 2022-03-22 DIAGNOSIS — N390 Urinary tract infection, site not specified: Secondary | ICD-10-CM

## 2022-03-22 LAB — URINE CULTURE

## 2022-03-22 MED ORDER — FOSFOMYCIN TROMETHAMINE 3 G PO PACK: PACK | ORAL | 1 refills | Status: DC

## 2022-03-22 NOTE — Telephone Encounter (Signed)
Wife returned call and made aware to stop Macrobid and start Fosfomycin

## 2022-03-23 ENCOUNTER — Inpatient Hospital Stay: Payer: Medicare Other | Attending: Oncology

## 2022-03-23 ENCOUNTER — Inpatient Hospital Stay (HOSPITAL_BASED_OUTPATIENT_CLINIC_OR_DEPARTMENT_OTHER): Payer: Medicare Other | Admitting: Oncology

## 2022-03-23 ENCOUNTER — Other Ambulatory Visit: Payer: Self-pay

## 2022-03-23 VITALS — BP 131/80 | HR 89 | Temp 97.7°F | Resp 16 | Ht 71.0 in | Wt 203.1 lb

## 2022-03-23 DIAGNOSIS — C679 Malignant neoplasm of bladder, unspecified: Secondary | ICD-10-CM | POA: Diagnosis not present

## 2022-03-23 DIAGNOSIS — Z923 Personal history of irradiation: Secondary | ICD-10-CM | POA: Diagnosis not present

## 2022-03-23 DIAGNOSIS — C7951 Secondary malignant neoplasm of bone: Secondary | ICD-10-CM | POA: Insufficient documentation

## 2022-03-23 LAB — CBC WITH DIFFERENTIAL (CANCER CENTER ONLY)
Abs Immature Granulocytes: 0.03 10*3/uL (ref 0.00–0.07)
Basophils Absolute: 0.1 10*3/uL (ref 0.0–0.1)
Basophils Relative: 1 %
Eosinophils Absolute: 0.3 10*3/uL (ref 0.0–0.5)
Eosinophils Relative: 4 %
HCT: 43.2 % (ref 39.0–52.0)
Hemoglobin: 14.9 g/dL (ref 13.0–17.0)
Immature Granulocytes: 0 %
Lymphocytes Relative: 6 %
Lymphs Abs: 0.5 10*3/uL — ABNORMAL LOW (ref 0.7–4.0)
MCH: 35.6 pg — ABNORMAL HIGH (ref 26.0–34.0)
MCHC: 34.5 g/dL (ref 30.0–36.0)
MCV: 103.1 fL — ABNORMAL HIGH (ref 80.0–100.0)
Monocytes Absolute: 0.9 10*3/uL (ref 0.1–1.0)
Monocytes Relative: 12 %
Neutro Abs: 6.2 10*3/uL (ref 1.7–7.7)
Neutrophils Relative %: 77 %
Platelet Count: 197 10*3/uL (ref 150–400)
RBC: 4.19 MIL/uL — ABNORMAL LOW (ref 4.22–5.81)
RDW: 12.9 % (ref 11.5–15.5)
WBC Count: 8 10*3/uL (ref 4.0–10.5)
nRBC: 0 % (ref 0.0–0.2)

## 2022-03-23 LAB — CMP (CANCER CENTER ONLY)
ALT: 11 U/L (ref 0–44)
AST: 13 U/L — ABNORMAL LOW (ref 15–41)
Albumin: 3.7 g/dL (ref 3.5–5.0)
Alkaline Phosphatase: 61 U/L (ref 38–126)
Anion gap: 5 (ref 5–15)
BUN: 12 mg/dL (ref 8–23)
CO2: 29 mmol/L (ref 22–32)
Calcium: 8.8 mg/dL — ABNORMAL LOW (ref 8.9–10.3)
Chloride: 106 mmol/L (ref 98–111)
Creatinine: 0.97 mg/dL (ref 0.61–1.24)
GFR, Estimated: >60 mL/min (ref >60)
Glucose, Bld: 98 mg/dL (ref 70–99)
Potassium: 4 mmol/L (ref 3.5–5.1)
Sodium: 140 mmol/L (ref 135–145)
Total Bilirubin: 0.5 mg/dL (ref 0.3–1.2)
Total Protein: 6.4 g/dL — ABNORMAL LOW (ref 6.5–8.1)

## 2022-03-23 MED ORDER — HYDROMORPHONE HCL 4 MG PO TABS: 4.0000 mg | ORAL_TABLET | ORAL | 0 refills | Status: DC | PRN

## 2022-03-23 MED ORDER — OXYCODONE HCL 5 MG PO TABS: ORAL_TABLET | ORAL | 0 refills | Status: DC

## 2022-03-23 NOTE — Progress Notes (Signed)
Hematology and Oncology Follow Up   [name] [id number] [birth year] 81 y.o. 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  This tumor found to have PDL 1 positive  with CPS score over 90%.    Prior Therapy:    He is status post neoadjuvant chemotherapy utilizing gemcitabine and cisplatin started in September 2018.  He received day 1 of cycle 1 on 05/02/2017 and therapy discontinued after that.   He is status post laparoscopic Cystoprostatectomy and bilateral lymphadenectomy done by Dr. Tresa Moore on 06/29/2017. Final pathology showed a T3bN0 disease.  He has 13 lymph nodes sampled without any evidence of malignancy.   He completed radiation therapy to the pelvis on January 16, 2018 under the care of Dr. Tammi Klippel.     Carboplatin and gemcitabine he is status post 6 cycles of therapy completed on 05/22/2018.   Pembrolizumab 200 mg every 3 weeks started on 06/21/2018.  He completed 8 cycles of therapy and April 2020.  Therapy discontinued because of dermatological toxicities.   He is status post radiation therapy to the right paratracheal lymph node for a total of 50 Gray in 10 fractions completed on April 23, 2020.  He is status post definitive radiation to intrathoracic lymph node completed on November 27, 2021.  He received 40 Gray in 10 fractions.    Current therapy:  Active surveillance.   Interim History: [name] returns today for repeat evaluation.  Since the last visit, he reports feeling well without any major complaints.  He was diagnosed with a urinary tract infection currently finishing course of antibiotics with fosfomycin.  He has reported some occasional cough but no shortness of breath or difficulty breathing.  He denies any wheezing or hemoptysis.  Medications: Reviewed without changes. Current Outpatient Medications   Medication Sig Dispense Refill   acetaminophen (TYLENOL) 500 MG tablet Take 500 mg by mouth every 6 (six) hours as needed.     clotrimazole-betamethasone (LOTRISONE) cream Apply 1 application topically in the morning and at bedtime.     fosfomycin (MONUROL) 3 g PACK Take 3 g x 1 then repeat dose in 2 days. 3 g 1   HYDROmorphone (DILAUDID) 4 MG tablet Take 1 tablet (4 mg total) by mouth every 4 (four) hours as needed for severe pain. 120 tablet 0   lactose free nutrition (BOOST) LIQD Take 237 mLs by mouth 3 (three) times daily between meals.      levothyroxine (SYNTHROID) 50 MCG tablet Take 50 mcg by mouth daily.     nitrofurantoin (MACRODANTIN) 50 MG capsule Take 1 capsule (50 mg total) by mouth at bedtime. 30 capsule 11   oxyCODONE (OXY IR/ROXICODONE) 5 MG immediate release tablet TAKE 1-2 TABLETS BY MOUTH EVERY (4) HOURS AS NEEDED. 120 tablet 0   polyethylene glycol (MIRALAX / GLYCOLAX) 17 g packet Take 17 g by mouth daily as needed for mild constipation. 14 each 0   Current Facility-Administered Medications  Medication Dose Route Frequency Provider Last Rate Last Admin   omalizumab Arvid Right) prefilled syringe 300 mg  300 mg Subcutaneous Q14 Days Valentina Shaggy, MD   300 mg at 03/17/22 0920     Allergies:  Allergies  Allergen Reactions   Ciprofloxacin Hives   Cefepime Rash   Zofran [Ondansetron] Rash   Physical exam:  Blood pressure 131/80, pulse 89, temperature 97.7 F (36.5 C),  temperature source Temporal, resp. rate 16, height '5\' 11"'$  (1.803 m), weight 203 lb 1.6 oz (92.1 kg), SpO2 99 %.       ECOG 1   General appearance: Alert, awake without any distress. Head: Atraumatic without abnormalities Oropharynx: Without any thrush or ulcers. Eyes: No scleral icterus. Lymph nodes: No lymphadenopathy noted in the cervical, supraclavicular, or axillary nodes Heart:regular rate and rhythm, without any murmurs or gallops.   Lung: Clear to auscultation without any rhonchi,  wheezes or dullness to percussion. Abdomin: Soft, nontender without any shifting dullness or ascites. Musculoskeletal: No clubbing or cyanosis. Neurological: No motor or sensory deficits. Skin: No rashes or lesions.           Lab Results: Lab Results  Component Value Date   WBC 8.0 03/23/2022   HGB 14.9 03/23/2022   HCT 43.2 03/23/2022   MCV 103.1 (H) 03/23/2022   PLT 197 03/23/2022     Chemistry      Component Value Date/Time   NA 140 01/07/2022 1432   NA 140 04/30/2019 1033   NA 141 05/23/2017 0828   K 4.2 01/07/2022 1432   K 4.0 05/23/2017 0828   CL 105 01/07/2022 1432   CO2 29 01/07/2022 1432   CO2 27 05/23/2017 0828   BUN 13 01/07/2022 1432   BUN 16 04/30/2019 1033   BUN 12.5 05/23/2017 0828   CREATININE 1.12 01/07/2022 1432   CREATININE 0.8 05/23/2017 0828      Component Value Date/Time   CALCIUM 8.9 01/07/2022 1432   CALCIUM 9.0 05/23/2017 0828   ALKPHOS 60 01/07/2022 1432   ALKPHOS 57 05/23/2017 0828   AST 14 (L) 01/07/2022 1432   AST 13 05/23/2017 0828   ALT 18 01/07/2022 1432   ALT 15 05/23/2017 0828   BILITOT 0.4 01/07/2022 1432   BILITOT 0.37 05/23/2017 0828       IMPRESSION: 1. Overall decrease in hypermetabolism involving mediastinal and hilar lymph nodes, compatible with response to therapy. 2. Probable post treatment scarring in the apical segment right upper lobe. 3. 10 mm nodular lesion in the anterior left upper lobe, borderline for PET resolution but stable in size from 10/22/2021. Recommend continued CT observation. 4. Focal anorectal hypermetabolism. Please correlate clinically as malignancy cannot be excluded. 5. Expansile lytic lesion and pathologic fracture involving the right inferior pubic ramus, without abnormal hypermetabolism, findings possibly representing treated metastatic disease. 6. Moderate to large bilateral pleural effusions. 7. Aortic atherosclerosis (ICD10-I70.0). Coronary artery     Impression and  Plan:  81 year old man with:    1.   Bladder cancer diagnosed in 2018.  He was found to have stage IV high-grade urothelial carcinoma with pulmonary and bone disease.   His disease status was updated at this time and treatment choices were reviewed.  PET scan obtained on March 15, 2022 and showed no evidence of disease progression.  Mediastinal lymph node continues to show response to therapy without any new areas of metastatic disease noted.  Treatment options moving forward including reintroducing immunotherapy versus antibody drug conjugate versus continued active surveillance.  I recommended reinstituting therapy only if progression of disease is detected.    2. Hip pain: He remains on oxycodone and Dilaudid with the pain is manageable.  His medication will be refilled for him.   3.  Intrathoracic lymphadenopathy: Continues to improve based on imaging studies and status post radiation.   4.  Follow-up: He will return in 3 months for a follow-up.  We will update his  staging scans in 6 months.   30  minutes were spent on this encounter.  The time was dedicated to reviewing imaging studies, disease status update and outlining future plan of care discussion.  Zola Button, MD 03/23/2022 1:07 PM

## 2022-03-29 ENCOUNTER — Telehealth: Payer: Self-pay | Admitting: Oncology

## 2022-03-29 NOTE — Telephone Encounter (Signed)
Scheduled per 08/08 los, patient has been called and voicemail was left.

## 2022-03-30 DIAGNOSIS — L501 Idiopathic urticaria: Secondary | ICD-10-CM | POA: Diagnosis not present

## 2022-03-31 ENCOUNTER — Encounter: Payer: Self-pay | Admitting: Allergy & Immunology

## 2022-03-31 ENCOUNTER — Ambulatory Visit (INDEPENDENT_AMBULATORY_CARE_PROVIDER_SITE_OTHER): Payer: Medicare Other | Admitting: Allergy & Immunology

## 2022-03-31 ENCOUNTER — Ambulatory Visit: Payer: Medicare Other

## 2022-03-31 VITALS — BP 114/68 | HR 86 | Temp 98.1°F | Resp 16 | Ht 71.0 in | Wt 201.2 lb

## 2022-03-31 DIAGNOSIS — L501 Idiopathic urticaria: Secondary | ICD-10-CM

## 2022-03-31 DIAGNOSIS — F1190 Opioid use, unspecified, uncomplicated: Secondary | ICD-10-CM | POA: Diagnosis not present

## 2022-03-31 MED ORDER — PREDNISONE 10 MG PO TABS
ORAL_TABLET | ORAL | 0 refills | Status: DC
Start: 2022-03-31 — End: 2022-04-17

## 2022-03-31 NOTE — Patient Instructions (Addendum)
1. Idiopathic urticaria - Continue with Xolair every two weeks. - Start the prednisone pack provided today. - Restart daily antihistamines (such as Zyrtec or cetirizine '10mg'$  once daily).  - We can change injectable medications, but this might just be a routine flare that we need to treat.   2. Return in about 6 months (around 10/01/2022).    Please inform us of any Emergency Department visits, hospitalizations, or changes in symptoms. Call us before going to the ED for breathing or allergy symptoms since we might be able to fit you in for a sick visit. Feel free to contact us anytime with any questions, problems, or concerns.  It was a pleasure to see you today!  Websites that have reliable patient information: 1. American Academy of Asthma, Allergy, and Immunology: www.aaaai.org 2. Food Allergy Research and Education (FARE): foodallergy.org 3. Mothers of Asthmatics: http://www.asthmacommunitynetwork.org 4. American College of Allergy, Asthma, and Immunology: www.acaai.org   COVID-19 Vaccine Information can be found at: ShippingScam.co.uk For questions related to vaccine distribution or appointments, please email [email or web address] or call [phone].   We realize that you might be concerned about having an allergic reaction to the COVID19 vaccines. To help with that concern, WE ARE OFFERING THE COVID19 VACCINES IN OUR OFFICE! Ask the front desk for dates!     "Like" [place] on Facebook and Instagram for our latest updates!      A healthy democracy works best when [employer] participate! Make sure you are registered to vote! If you have moved or changed any of your contact information, you will need to get this updated before voting!  In some cases, you MAY be able to register to vote online: [email or web address]

## 2022-03-31 NOTE — Progress Notes (Signed)
FOLLOW UP  Date of Service/Encounter:  03/31/22   Assessment:   Chronic urticaria with itching - on every two week Xolair with some improvement   Complicated past medical history, including metastatic bladder cancer - s/p multiple chemotherapeutic regimens as well as immunotherapy and multiple rounds of radiation   Pain control with opioids    Plan/Recommendations:   1. Idiopathic urticaria - with acute flare - Continue with Xolair every two weeks. - Start the prednisone pack provided today. - Restart daily antihistamines (such as Zyrtec or cetirizine '10mg'$  once daily).  - We can change injectable medications, but this might just be a routine flare that we need to treat.   2. Return in about 6 months (around 10/01/2022).    Subjective:   [name] is a 81 y.o. male presenting today for follow up of  Chief Complaint  Patient presents with   Rash    Left Shoulder spreading to upper back x 2 wks    [name] has a history of the following: Patient Active Problem List   Diagnosis Date Noted   C. 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   Sepsis ([place]) 07/13/2020   Pseudomonas aeruginosa infection 06/18/2020   GERD (gastroesophageal reflux disease)    Sinus tachycardia    Fever    Dehydration    Rigors    Severe sepsis ([facility])    Elevated lactic acid level    Cancer with pulmonary metastases ([place]) 03/31/2020   Adverse drug reaction 06/05/2018   Bacteremia associated with intravascular line ([place]) 04/20/2018   Febrile neutropenia ([place]) 04/13/2018   Gram positive sepsis ([place]) 04/13/2018   SIRS (systemic inflammatory response syndrome) ([place]) 04/12/2018   Bacteremia due to Gram-positive bacteria 04/12/2018   Pancytopenia ([place]) 04/12/2018   Status post chemotherapy    UTI (urinary tract infection) 04/11/2018   Port-A-Cath in place 02/20/2018   Encounter for antineoplastic  chemotherapy 02/17/2018   Sepsis due to urinary tract infection ([place]) 01/25/2018   Sepsis secondary to UTI ([place]) 01/25/2018   Hyperglycemia    Goals of care, counseling/discussion 01/18/2018   Bone metastasis 12/29/2017   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  [name] is a 81 y.o. male presenting for a sick visit.  He was last seen in June 2021 by Althea Charon, one of our nurse practitioners.  At that time, he was restarted on Xolair every 2 weeks to help with his urticaria.  At that time, he had recently been diagnosed with a bladder cancer metastasis on his hip.  Since the last visit, he has done well. It was previously working well for a couple of years. He is getting these every two weeks. He has not had a breakout this bad for a couple of years. He has been broken out for 3 weeks or so.  He had the PET scan at the end of July but the itching had already started. He does not use any antihistamines routinely. He has "something [he] got at the hospital" but he is not sure of the name of it. He has some betamethasone on his chart and he thinks that this may be it.   He gets complete relief from the prednisone. He has not gotten prednisone  in over one year. He has hip pain and he has not been able to play golf. He uses the oxycodone. He uses that every day. He alternates between Dilaudid and oxycodone. It might help, but it is hard to get this from him. He has not increased the use of his opioids in 3 years or so.   He denies any new exposures.  He has not been eating anything out of the ordinary.  He denies changes to his detergents, soaps, or shampoos.  He has not been traveling this summer because he has not felt very good.  His wife does not have any of the same symptoms.  She is  actually doing quite well.  He continues to follow with Dr. Alen Blew of the [facility].  At that time, he was doing well without any complaints.  He last saw him last week.  He was doing well last week.  A PET scan from July 2023 showed no evidence of disease progression.  He remained on oxycodone and Dilaudid for pain control.  He is being followed every 3 months.    He is status post laparoscopic cystoprostatectomy and bilateral lymphadenectomy in November 2018.  He completed radiation therapy in 2019.  He completed carboplatin and gemcitabine in October 2019.  He completed pembrolizumab every 3 weeks in April 2020.  He was experiencing dermatologic toxicities from that.  He underwent paratracheal lymph node radiation and completed that in September 2021.  He underwent additional radiation to intrathoracic lymph nodes and completed those in April 2023.  Otherwise, there have been no changes to his past medical history, surgical history, family history, or social history.    Review of Systems  Constitutional:  Positive for malaise/fatigue. Negative for chills, fever and weight loss.  HENT: Negative.  Negative for congestion, ear discharge and ear pain.   Eyes:  Negative for pain, discharge and redness.  Respiratory:  Negative for cough, sputum production, shortness of breath and wheezing.   Cardiovascular: Negative.  Negative for chest pain and palpitations.  Gastrointestinal:  Negative for abdominal pain, blood in stool, diarrhea, heartburn, nausea and vomiting.  Musculoskeletal:  Positive for back pain and joint pain.  Skin:  Positive for itching and rash.  Neurological:  Negative for dizziness and headaches.  Endo/Heme/Allergies:  Negative for environmental allergies. Does not bruise/bleed easily.       Objective:   Blood pressure 114/68, pulse 86, temperature 98.1 F (36.7 C), resp. rate 16, height '5\' 11"'$  (1.803 m), weight 201 lb 3.2 oz (91.3 kg), SpO2 93 %. Body mass  index is 28.06 kg/m.    Physical Exam Constitutional:      Appearance: He is well-developed.  HENT:     Head: Normocephalic and atraumatic.     Right Ear: Tympanic membrane, ear canal and external ear normal. No drainage, swelling or tenderness. Tympanic membrane is not injected, scarred, erythematous, retracted or bulging.     Left Ear: Tympanic membrane, ear canal and external ear normal. No drainage, swelling or tenderness. Tympanic membrane is not injected, scarred, erythematous, retracted or bulging.     Nose: No nasal deformity, septal deviation, mucosal edema or rhinorrhea.     Right Sinus: No maxillary sinus tenderness or frontal sinus tenderness.     Left Sinus: No maxillary sinus tenderness or frontal sinus tenderness.     Mouth/Throat:     Mouth: Mucous membranes are not pale and not dry.     Pharynx: Uvula midline.  Eyes:  General:        Right eye: No discharge.        Left eye: No discharge.     Conjunctiva/sclera: Conjunctivae normal.     Right eye: Right conjunctiva is not injected. No chemosis.    Left eye: Left conjunctiva is not injected. No chemosis.    Pupils: Pupils are equal, round, and reactive to light.  Cardiovascular:     Rate and Rhythm: Normal rate and regular rhythm.     Heart sounds: Normal heart sounds.  Pulmonary:     Effort: Pulmonary effort is normal. No tachypnea, accessory muscle usage or respiratory distress.     Breath sounds: Normal breath sounds. No wheezing, rhonchi or rales.  Chest:     Chest wall: No tenderness.  Lymphadenopathy:     Head:     Right side of head: No submandibular, tonsillar or occipital adenopathy.     Left side of head: No submandibular, tonsillar or occipital adenopathy.     Cervical: No cervical adenopathy.  Skin:    General: Skin is warm.     Capillary Refill: Capillary refill takes less than 2 seconds.     Coloration: Skin is not pale.     Findings: Rash present. No abrasion, erythema or petechiae. Rash is  not papular, urticarial or vesicular.     Comments: Multiple actinic keratosis.  He does have some urticaria on his chest as well as the top of his back.  Multiple areas of bruising.  Multiple excoriations.  Neurological:     Mental Status: He is alert.      Diagnostic studies: none      Salvatore Marvel, MD  [facility]

## 2022-04-11 ENCOUNTER — Inpatient Hospital Stay (HOSPITAL_COMMUNITY)
Admission: EM | Admit: 2022-04-11 | Discharge: 2022-04-17 | DRG: 698 | Disposition: A | Discharge status: Home / Self Care | Payer: Medicare Other | Attending: Family Medicine | Admitting: Family Medicine

## 2022-04-11 ENCOUNTER — Emergency Department (HOSPITAL_COMMUNITY)
Admission: EM | Admit: 2022-04-11 | Discharge: 2022-04-11 | Disposition: A | Discharge status: Home / Self Care | Payer: Medicare Other | Source: Home / Self Care | Attending: Emergency Medicine | Admitting: Emergency Medicine

## 2022-04-11 ENCOUNTER — Other Ambulatory Visit: Payer: Self-pay

## 2022-04-11 ENCOUNTER — Encounter (HOSPITAL_COMMUNITY): Payer: Self-pay

## 2022-04-11 ENCOUNTER — Emergency Department (HOSPITAL_COMMUNITY): Payer: Medicare Other

## 2022-04-11 ENCOUNTER — Encounter (HOSPITAL_COMMUNITY): Payer: Self-pay | Admitting: *Deleted

## 2022-04-11 DIAGNOSIS — R000 Tachycardia, unspecified: Secondary | ICD-10-CM | POA: Insufficient documentation

## 2022-04-11 DIAGNOSIS — N134 Hydroureter: Secondary | ICD-10-CM | POA: Diagnosis not present

## 2022-04-11 DIAGNOSIS — J449 Chronic obstructive pulmonary disease, unspecified: Secondary | ICD-10-CM | POA: Diagnosis not present

## 2022-04-11 DIAGNOSIS — N390 Urinary tract infection, site not specified: Secondary | ICD-10-CM

## 2022-04-11 DIAGNOSIS — Z888 Allergy status to other drugs, medicaments and biological substances status: Secondary | ICD-10-CM | POA: Diagnosis not present

## 2022-04-11 DIAGNOSIS — Z906 Acquired absence of other parts of urinary tract: Secondary | ICD-10-CM

## 2022-04-11 DIAGNOSIS — Z881 Allergy status to other antibiotic agents status: Secondary | ICD-10-CM | POA: Diagnosis not present

## 2022-04-11 DIAGNOSIS — Z8551 Personal history of malignant neoplasm of bladder: Secondary | ICD-10-CM | POA: Diagnosis not present

## 2022-04-11 DIAGNOSIS — Z936 Other artificial openings of urinary tract status: Secondary | ICD-10-CM

## 2022-04-11 DIAGNOSIS — K219 Gastro-esophageal reflux disease without esophagitis: Secondary | ICD-10-CM | POA: Diagnosis not present

## 2022-04-11 DIAGNOSIS — I4891 Unspecified atrial fibrillation: Secondary | ICD-10-CM | POA: Diagnosis not present

## 2022-04-11 DIAGNOSIS — Z79899 Other long term (current) drug therapy: Secondary | ICD-10-CM

## 2022-04-11 DIAGNOSIS — I480 Paroxysmal atrial fibrillation: Secondary | ICD-10-CM | POA: Diagnosis not present

## 2022-04-11 DIAGNOSIS — C679 Malignant neoplasm of bladder, unspecified: Secondary | ICD-10-CM | POA: Diagnosis present

## 2022-04-11 DIAGNOSIS — E876 Hypokalemia: Secondary | ICD-10-CM | POA: Diagnosis not present

## 2022-04-11 DIAGNOSIS — N1330 Unspecified hydronephrosis: Secondary | ICD-10-CM | POA: Diagnosis present

## 2022-04-11 DIAGNOSIS — Z8619 Personal history of other infectious and parasitic diseases: Secondary | ICD-10-CM

## 2022-04-11 DIAGNOSIS — Z79890 Hormone replacement therapy (postmenopausal): Secondary | ICD-10-CM | POA: Diagnosis not present

## 2022-04-11 DIAGNOSIS — Z20822 Contact with and (suspected) exposure to covid-19: Secondary | ICD-10-CM | POA: Diagnosis present

## 2022-04-11 DIAGNOSIS — Z87891 Personal history of nicotine dependence: Secondary | ICD-10-CM

## 2022-04-11 DIAGNOSIS — Z923 Personal history of irradiation: Secondary | ICD-10-CM | POA: Diagnosis not present

## 2022-04-11 DIAGNOSIS — A419 Sepsis, unspecified organism: Secondary | ICD-10-CM | POA: Diagnosis not present

## 2022-04-11 DIAGNOSIS — R8289 Other abnormal findings on cytological and histological examination of urine: Secondary | ICD-10-CM | POA: Insufficient documentation

## 2022-04-11 DIAGNOSIS — T83598A Infection and inflammatory reaction due to other prosthetic device, implant and graft in urinary system, initial encounter: Secondary | ICD-10-CM | POA: Diagnosis not present

## 2022-04-11 DIAGNOSIS — Z95828 Presence of other vascular implants and grafts: Secondary | ICD-10-CM

## 2022-04-11 DIAGNOSIS — A4151 Sepsis due to Escherichia coli [E. coli]: Secondary | ICD-10-CM

## 2022-04-11 DIAGNOSIS — Z91199 Patient's noncompliance with other medical treatment and regimen due to unspecified reason: Secondary | ICD-10-CM

## 2022-04-11 DIAGNOSIS — A498 Other bacterial infections of unspecified site: Secondary | ICD-10-CM | POA: Diagnosis present

## 2022-04-11 DIAGNOSIS — Z87440 Personal history of urinary (tract) infections: Secondary | ICD-10-CM

## 2022-04-11 DIAGNOSIS — D72829 Elevated white blood cell count, unspecified: Secondary | ICD-10-CM | POA: Insufficient documentation

## 2022-04-11 DIAGNOSIS — N281 Cyst of kidney, acquired: Secondary | ICD-10-CM | POA: Diagnosis not present

## 2022-04-11 DIAGNOSIS — M549 Dorsalgia, unspecified: Secondary | ICD-10-CM | POA: Insufficient documentation

## 2022-04-11 DIAGNOSIS — C7800 Secondary malignant neoplasm of unspecified lung: Secondary | ICD-10-CM | POA: Diagnosis not present

## 2022-04-11 DIAGNOSIS — D6489 Other specified anemias: Secondary | ICD-10-CM | POA: Diagnosis not present

## 2022-04-11 DIAGNOSIS — J90 Pleural effusion, not elsewhere classified: Secondary | ICD-10-CM | POA: Diagnosis not present

## 2022-04-11 DIAGNOSIS — Z86010 Personal history of colonic polyps: Secondary | ICD-10-CM | POA: Diagnosis not present

## 2022-04-11 DIAGNOSIS — C7951 Secondary malignant neoplasm of bone: Secondary | ICD-10-CM | POA: Diagnosis present

## 2022-04-11 DIAGNOSIS — A4152 Sepsis due to Pseudomonas: Secondary | ICD-10-CM | POA: Diagnosis not present

## 2022-04-11 DIAGNOSIS — R319 Hematuria, unspecified: Secondary | ICD-10-CM | POA: Diagnosis not present

## 2022-04-11 DIAGNOSIS — Y732 Prosthetic and other implants, materials and accessory gastroenterology and urology devices associated with adverse incidents: Secondary | ICD-10-CM | POA: Diagnosis present

## 2022-04-11 DIAGNOSIS — R0600 Dyspnea, unspecified: Secondary | ICD-10-CM | POA: Diagnosis not present

## 2022-04-11 DIAGNOSIS — R531 Weakness: Secondary | ICD-10-CM | POA: Diagnosis not present

## 2022-04-11 DIAGNOSIS — R509 Fever, unspecified: Secondary | ICD-10-CM | POA: Diagnosis not present

## 2022-04-11 DIAGNOSIS — R1110 Vomiting, unspecified: Secondary | ICD-10-CM | POA: Diagnosis not present

## 2022-04-11 DIAGNOSIS — E039 Hypothyroidism, unspecified: Secondary | ICD-10-CM | POA: Diagnosis present

## 2022-04-11 HISTORY — DX: Malignant neoplasm of bone and articular cartilage, unspecified: C41.9

## 2022-04-11 LAB — COMPREHENSIVE METABOLIC PANEL
ALT: 19 U/L (ref 0–44)
ALT: 21 U/L (ref 0–44)
AST: 16 U/L (ref 15–41)
AST: 20 U/L (ref 15–41)
Albumin: 3.3 g/dL — ABNORMAL LOW (ref 3.5–5.0)
Albumin: 3.2 g/dL — ABNORMAL LOW (ref 3.5–5.0)
Alkaline Phosphatase: 62 U/L (ref 38–126)
Alkaline Phosphatase: 59 U/L (ref 38–126)
Anion gap: 7 (ref 5–15)
Anion gap: 9 (ref 5–15)
BUN: 14 mg/dL (ref 8–23)
BUN: 12 mg/dL (ref 8–23)
CO2: 26 mmol/L (ref 22–32)
CO2: 24 mmol/L (ref 22–32)
Calcium: 8.6 mg/dL — ABNORMAL LOW (ref 8.9–10.3)
Calcium: 8.6 mg/dL — ABNORMAL LOW (ref 8.9–10.3)
Chloride: 107 mmol/L (ref 98–111)
Chloride: 105 mmol/L (ref 98–111)
Creatinine, Ser: 0.95 mg/dL (ref 0.61–1.24)
Creatinine, Ser: 0.94 mg/dL (ref 0.61–1.24)
GFR, Estimated: >60 mL/min (ref >60)
GFR, Estimated: >60 mL/min (ref >60)
Glucose, Bld: 124 mg/dL — ABNORMAL HIGH (ref 70–99)
Glucose, Bld: 156 mg/dL — ABNORMAL HIGH (ref 70–99)
Potassium: 3.9 mmol/L (ref 3.5–5.1)
Potassium: 3.7 mmol/L (ref 3.5–5.1)
Sodium: 140 mmol/L (ref 135–145)
Sodium: 138 mmol/L (ref 135–145)
Total Bilirubin: 1.2 mg/dL (ref 0.3–1.2)
Total Bilirubin: 0.9 mg/dL (ref 0.3–1.2)
Total Protein: 6.5 g/dL (ref 6.5–8.1)
Total Protein: 6.4 g/dL — ABNORMAL LOW (ref 6.5–8.1)

## 2022-04-11 LAB — CBC WITH DIFFERENTIAL/PLATELET
Abs Immature Granulocytes: 0.09 10*3/uL — ABNORMAL HIGH (ref 0.00–0.07)
Abs Immature Granulocytes: 0.15 10*3/uL — ABNORMAL HIGH (ref 0.00–0.07)
Basophils Absolute: 0.1 10*3/uL (ref 0.0–0.1)
Basophils Absolute: 0.1 10*3/uL (ref 0.0–0.1)
Basophils Relative: 0 %
Basophils Relative: 0 %
Eosinophils Absolute: 0.1 10*3/uL (ref 0.0–0.5)
Eosinophils Absolute: 0 10*3/uL (ref 0.0–0.5)
Eosinophils Relative: 1 %
Eosinophils Relative: 0 %
HCT: 44.5 % (ref 39.0–52.0)
HCT: 43.3 % (ref 39.0–52.0)
Hemoglobin: 15 g/dL (ref 13.0–17.0)
Hemoglobin: 14.5 g/dL (ref 13.0–17.0)
Immature Granulocytes: 1 %
Immature Granulocytes: 1 %
Lymphocytes Relative: 3 %
Lymphocytes Relative: 1 %
Lymphs Abs: 0.4 10*3/uL — ABNORMAL LOW (ref 0.7–4.0)
Lymphs Abs: 0.2 10*3/uL — ABNORMAL LOW (ref 0.7–4.0)
MCH: 35.5 pg — ABNORMAL HIGH (ref 26.0–34.0)
MCH: 35.3 pg — ABNORMAL HIGH (ref 26.0–34.0)
MCHC: 33.7 g/dL (ref 30.0–36.0)
MCHC: 33.5 g/dL (ref 30.0–36.0)
MCV: 105.5 fL — ABNORMAL HIGH (ref 80.0–100.0)
MCV: 105.4 fL — ABNORMAL HIGH (ref 80.0–100.0)
Monocytes Absolute: 1.3 10*3/uL — ABNORMAL HIGH (ref 0.1–1.0)
Monocytes Absolute: 2.1 10*3/uL — ABNORMAL HIGH (ref 0.1–1.0)
Monocytes Relative: 9 %
Monocytes Relative: 9 %
Neutro Abs: 13.6 10*3/uL — ABNORMAL HIGH (ref 1.7–7.7)
Neutro Abs: 21.2 10*3/uL — ABNORMAL HIGH (ref 1.7–7.7)
Neutrophils Relative %: 86 %
Neutrophils Relative %: 89 %
Platelets: 185 10*3/uL (ref 150–400)
Platelets: 200 10*3/uL (ref 150–400)
RBC: 4.22 MIL/uL (ref 4.22–5.81)
RBC: 4.11 MIL/uL — ABNORMAL LOW (ref 4.22–5.81)
RDW: 13.2 % (ref 11.5–15.5)
RDW: 13 % (ref 11.5–15.5)
WBC: 15.6 10*3/uL — ABNORMAL HIGH (ref 4.0–10.5)
WBC: 23.6 10*3/uL — ABNORMAL HIGH (ref 4.0–10.5)
nRBC: 0 % (ref 0.0–0.2)
nRBC: 0 % (ref 0.0–0.2)

## 2022-04-11 LAB — URINALYSIS, ROUTINE W REFLEX MICROSCOPIC
Bacteria, UA: NONE SEEN
Bilirubin Urine: NEGATIVE
Bilirubin Urine: NEGATIVE
Glucose, UA: NEGATIVE mg/dL
Glucose, UA: NEGATIVE mg/dL
Ketones, ur: NEGATIVE mg/dL
Ketones, ur: NEGATIVE mg/dL
Nitrite: NEGATIVE
Nitrite: NEGATIVE
Protein, ur: 100 mg/dL — AB
Protein, ur: 30 mg/dL — AB
RBC / HPF: >50 RBC/hpf — ABNORMAL HIGH (ref 0–5)
Specific Gravity, Urine: 1.01 (ref 1.005–1.030)
Specific Gravity, Urine: 1.032 — ABNORMAL HIGH (ref 1.005–1.030)
WBC, UA: >50 WBC/hpf — ABNORMAL HIGH (ref 0–5)
WBC, UA: >50 WBC/hpf — ABNORMAL HIGH (ref 0–5)
pH: 6 (ref 5.0–8.0)
pH: 6 (ref 5.0–8.0)

## 2022-04-11 LAB — PROTIME-INR
INR: 1.1 (ref 0.8–1.2)
Prothrombin Time: 14 seconds (ref 11.4–15.2)

## 2022-04-11 LAB — LACTIC ACID, PLASMA
Lactic Acid, Venous: 3.1 mmol/L (ref 0.5–1.9)
Lactic Acid, Venous: 2.3 mmol/L (ref 0.5–1.9)

## 2022-04-11 LAB — RESP PANEL BY RT-PCR (FLU A&B, COVID) ARPGX2
Influenza A by PCR: NEGATIVE
Influenza B by PCR: NEGATIVE
SARS Coronavirus 2 by RT PCR: NEGATIVE

## 2022-04-11 LAB — APTT: aPTT: 28 seconds (ref 24–36)

## 2022-04-11 LAB — TSH: TSH: 1.341 u[IU]/mL (ref 0.350–4.500)

## 2022-04-11 MED ORDER — POLYETHYLENE GLYCOL 3350 17 G PO PACK: 17.0000 g | PACK | Freq: Every day | ORAL | Status: DC | PRN

## 2022-04-11 MED ORDER — ONDANSETRON HCL 4 MG/2ML IJ SOLN
4.0000 mg | Freq: Once | INTRAMUSCULAR | Status: AC
  Administered 2022-04-11: 4 mg via INTRAVENOUS
  Filled 2022-04-11: qty 2

## 2022-04-11 MED ORDER — MORPHINE SULFATE (PF) 4 MG/ML IV SOLN
4.0000 mg | Freq: Once | INTRAVENOUS | Status: AC
  Administered 2022-04-11: 4 mg via INTRAVENOUS
  Filled 2022-04-11: qty 1

## 2022-04-11 MED ORDER — BOOST PO LIQD
237.0000 mL | Freq: Three times a day (TID) | ORAL | Status: DC
  Administered 2022-04-11 – 2022-04-16 (×2): 237 mL via ORAL
  Filled 2022-04-11 (×24): qty 237

## 2022-04-11 MED ORDER — LACTATED RINGERS IV BOLUS (SEPSIS)
1000.0000 mL | Freq: Once | INTRAVENOUS | Status: AC
Start: 2022-04-11 — End: 2022-04-11
  Administered 2022-04-11: 1000 mL via INTRAVENOUS

## 2022-04-11 MED ORDER — SODIUM CHLORIDE 0.9 % IV BOLUS: 1000.0000 mL | Freq: Once | INTRAVENOUS | Status: DC

## 2022-04-11 MED ORDER — LACTATED RINGERS IV BOLUS (SEPSIS)
1000.0000 mL | Freq: Once | INTRAVENOUS | Status: AC
  Administered 2022-04-11: 1000 mL via INTRAVENOUS

## 2022-04-11 MED ORDER — ALBUTEROL SULFATE (2.5 MG/3ML) 0.083% IN NEBU: 2.5000 mg | INHALATION_SOLUTION | RESPIRATORY_TRACT | Status: DC | PRN

## 2022-04-11 MED ORDER — LEVOTHYROXINE SODIUM 50 MCG PO TABS
50.0000 ug | ORAL_TABLET | Freq: Every day | ORAL | Status: DC
  Administered 2022-04-12 – 2022-04-17 (×6): 50 ug via ORAL
  Filled 2022-04-11 (×6): qty 1

## 2022-04-11 MED ORDER — SODIUM CHLORIDE 0.9% FLUSH: 3.0000 mL | INTRAVENOUS | Status: DC | PRN

## 2022-04-11 MED ORDER — SODIUM CHLORIDE 0.9 % IV SOLN
1.0000 g | INTRAVENOUS | Status: DC
  Administered 2022-04-11 – 2022-04-12 (×2): 1000 mg via INTRAVENOUS
  Filled 2022-04-11 (×4): qty 1

## 2022-04-11 MED ORDER — ACETAMINOPHEN 650 MG RE SUPP
650.0000 mg | RECTAL | Status: DC | PRN
  Filled 2022-04-11: qty 1

## 2022-04-11 MED ORDER — POLYETHYLENE GLYCOL 3350 17 G PO PACK
17.0000 g | PACK | Freq: Every day | ORAL | Status: DC | PRN
Start: 2022-04-11 — End: 2022-04-17

## 2022-04-11 MED ORDER — IPRATROPIUM-ALBUTEROL 0.5-2.5 (3) MG/3ML IN SOLN
3.0000 mL | Freq: Three times a day (TID) | RESPIRATORY_TRACT | Status: DC
  Administered 2022-04-11 – 2022-04-12 (×3): 3 mL via RESPIRATORY_TRACT
  Filled 2022-04-11 (×3): qty 3

## 2022-04-11 MED ORDER — IOHEXOL 300 MG/ML  SOLN
100.0000 mL | Freq: Once | INTRAMUSCULAR | Status: AC | PRN
Start: 2022-04-11 — End: 2022-04-11
  Administered 2022-04-11: 100 mL via INTRAVENOUS

## 2022-04-11 MED ORDER — CEFDINIR 300 MG PO CAPS: 300.0000 mg | ORAL_CAPSULE | Freq: Two times a day (BID) | ORAL | 0 refills | Status: DC

## 2022-04-11 MED ORDER — SODIUM CHLORIDE 0.9% FLUSH
3.0000 mL | Freq: Two times a day (BID) | INTRAVENOUS | Status: DC
  Administered 2022-04-11 – 2022-04-16 (×5): 3 mL via INTRAVENOUS

## 2022-04-11 MED ORDER — OXYCODONE HCL 5 MG PO TABS
5.0000 mg | ORAL_TABLET | ORAL | Status: DC | PRN
  Administered 2022-04-13 – 2022-04-17 (×11): 5 mg via ORAL
  Filled 2022-04-11 (×11): qty 1

## 2022-04-11 MED ORDER — ACETAMINOPHEN 325 MG PO TABS: 650.0000 mg | ORAL_TABLET | Freq: Four times a day (QID) | ORAL | Status: DC | PRN

## 2022-04-11 MED ORDER — LACTATED RINGERS IV SOLN: INTRAVENOUS | Status: AC

## 2022-04-11 MED ORDER — SODIUM CHLORIDE 0.9 % IV SOLN
2.0000 g | Freq: Once | INTRAVENOUS | Status: AC
  Administered 2022-04-11: 2 g via INTRAVENOUS
  Filled 2022-04-11: qty 20

## 2022-04-11 MED ORDER — TRAZODONE HCL 50 MG PO TABS: 50.0000 mg | ORAL_TABLET | Freq: Every evening | ORAL | Status: DC | PRN

## 2022-04-11 MED ORDER — ACETAMINOPHEN 650 MG RE SUPP: 650.0000 mg | Freq: Four times a day (QID) | RECTAL | Status: DC | PRN

## 2022-04-11 MED ORDER — BISACODYL 10 MG RE SUPP: 10.0000 mg | Freq: Every day | RECTAL | Status: DC | PRN

## 2022-04-11 MED ORDER — SODIUM CHLORIDE 0.9 % IV BOLUS
1000.0000 mL | Freq: Once | INTRAVENOUS | Status: AC
  Administered 2022-04-11: 1000 mL via INTRAVENOUS

## 2022-04-11 MED ORDER — SODIUM CHLORIDE 0.9% FLUSH
3.0000 mL | Freq: Two times a day (BID) | INTRAVENOUS | Status: DC
  Administered 2022-04-11 – 2022-04-17 (×12): 3 mL via INTRAVENOUS

## 2022-04-11 MED ORDER — LEVOTHYROXINE SODIUM 50 MCG PO TABS: 50.0000 ug | ORAL_TABLET | Freq: Every day | ORAL | Status: DC

## 2022-04-11 MED ORDER — SODIUM CHLORIDE 0.9 % IV SOLN: INTRAVENOUS | Status: DC | PRN

## 2022-04-11 MED ORDER — HEPARIN SODIUM (PORCINE) 5000 UNIT/ML IJ SOLN
5000.0000 [IU] | Freq: Three times a day (TID) | INTRAMUSCULAR | Status: AC
  Administered 2022-04-11 – 2022-04-14 (×8): 5000 [IU] via SUBCUTANEOUS
  Filled 2022-04-11 (×9): qty 1

## 2022-04-11 MED ORDER — PROCHLORPERAZINE 25 MG RE SUPP: 25.0000 mg | Freq: Two times a day (BID) | RECTAL | Status: DC | PRN

## 2022-04-11 MED ORDER — PIPERACILLIN-TAZOBACTAM 3.375 G IVPB 30 MIN
3.3750 g | Freq: Once | INTRAVENOUS | Status: AC
  Administered 2022-04-11: 3.375 g via INTRAVENOUS
  Filled 2022-04-11: qty 50

## 2022-04-11 NOTE — ED Triage Notes (Signed)
Pt brought in by his wife with c/o generalized weakness, vomiting and fever of 102.6 since leaving the ED this morning around 0845. Pt was diagnosed with a UTI and given antibiotics but his wife reports he has worsened since leaving. Tylenol '650mg'$  given by wife at 87.

## 2022-04-11 NOTE — ED Notes (Signed)
Ice pack applied to back of neck and cool towel on chest and underarms to decrease fever. Popsicle requested from dietary to also help decrease fever.

## 2022-04-11 NOTE — Progress Notes (Signed)
[facility] is following code sepsis 

## 2022-04-11 NOTE — ED Notes (Signed)
Patient transported to CT 

## 2022-04-11 NOTE — ED Notes (Signed)
Pt drank a small amount of water and vomited it up.

## 2022-04-11 NOTE — ED Provider Notes (Signed)
[facility] EMERGENCY DEPARTMENT Provider Note   CSN: [id number] Arrival date & time: 04/11/22  0503     History  Chief Complaint  Patient presents with   dark, foul urine    Suprapubic cath    [name] is a 81 y.o. male.  81 year old male presents the ER today secondary to concern for urinary tract infection or pyelonephritis.  Patient states he had pyelonephritis in the past multiple urinary tract infections as well.  On review of the records it does appear that he had a obstructive uropathy but the associated with UTI and Pilo in the past and had been planned for nephrostomy tube however it resolved spontaneously and he recovered without much difficulty.  Patient states over the last 24 hours he has noticed his urine is dark and cloudy like it gets when he has UTIs prior to arrival or approximately around 1:00 he had a oral temperature of 103.  He took Tylenol and then back started to hurt and so associated with his urinary and changes he came here for evaluation.  This time he is just complaining of bilateral right greater than left back pain.  No trauma.  No emesis.  No other associated symptoms.        Home Medications Prior to Admission medications   Medication Sig Start Date End Date Taking? Authorizing Provider  acetaminophen (TYLENOL) 500 MG tablet Take 500 mg by mouth every 6 (six) hours as needed.    [provider]  clotrimazole-betamethasone (LOTRISONE) cream Apply 1 application topically in the morning and at bedtime. 06/27/20   [provider]  fosfomycin (MONUROL) 3 g PACK Take 3 g x 1 then repeat dose in 2 days. 03/22/22   McKenzie, Candee Furbish, MD  HYDROmorphone (DILAUDID) 4 MG tablet Take 1 tablet (4 mg total) by mouth every 4 (four) hours as needed for severe pain. 03/23/22   [name], MD  lactose free nutrition (BOOST) LIQD Take 237 mLs by mouth 3 (three) times daily between meals.     [provider]  levothyroxine (SYNTHROID)  50 MCG tablet Take 50 mcg by mouth daily. 10/01/21   [provider]  oxyCODONE (OXY IR/ROXICODONE) 5 MG immediate release tablet TAKE 1-2 TABLETS BY MOUTH EVERY (4) HOURS AS NEEDED. 03/23/22   [name], MD  polyethylene glycol (MIRALAX / GLYCOLAX) 17 g packet Take 17 g by mouth daily as needed for mild constipation. 07/18/20   Roxan Hockey, MD  predniSONE (DELTASONE) 10 MG tablet Take two tablets ('20mg'$ ) twice daily for three days, then one tablet ('10mg'$ ) twice daily for three days, then STOP. 03/31/22   Valentina Shaggy, MD      Allergies    Ciprofloxacin, Cefepime, and Zofran [ondansetron]    Review of Systems   Review of Systems  Physical Exam Updated Vital Signs BP (!) 154/79   Pulse (!) 105   Temp 98.6 F (37 C) (Oral)   Resp 18   Ht '5\' 11"'$  (1.803 m)   Wt 91.3 kg   SpO2 96%   BMI 28.06 kg/m  Physical Exam Vitals and nursing note reviewed.  Constitutional:      Appearance: He is well-developed.  HENT:     Head: Normocephalic and atraumatic.     Mouth/Throat:     Mouth: Mucous membranes are dry.  Eyes:     Pupils: Pupils are equal, round, and reactive to light.  Cardiovascular:     Rate and Rhythm: Tachycardia present.  Pulmonary:     Effort: Pulmonary effort is normal. No respiratory distress.  Abdominal:     General: Abdomen is flat. There is no distension.  Musculoskeletal:        General: Tenderness (R>L CVA) present. Normal range of motion.     Cervical back: Normal range of motion.  Skin:    General: Skin is warm and dry.     Coloration: Skin is not jaundiced or pale.  Neurological:     General: No focal deficit present.     Mental Status: He is alert.     ED Results / Procedures / Treatments   Labs (all labs ordered are listed, but only abnormal results are displayed) Labs Reviewed  CBC WITH DIFFERENTIAL/PLATELET - Abnormal; Notable for the following components:      Result Value   WBC 15.6 (*)    MCV 105.5 (*)    MCH 35.5 (*)     Neutro Abs 13.6 (*)    Lymphs Abs 0.4 (*)    Monocytes Absolute 1.3 (*)    Abs Immature Granulocytes 0.09 (*)    All other components within normal limits  COMPREHENSIVE METABOLIC PANEL - Abnormal; Notable for the following components:   Glucose, Bld 124 (*)    Calcium 8.6 (*)    Albumin 3.3 (*)    All other components within normal limits  URINALYSIS, ROUTINE W REFLEX MICROSCOPIC - Abnormal; Notable for the following components:   APPearance TURBID (*)    Hgb urine dipstick MODERATE (*)    Protein, ur 100 (*)    Leukocytes,Ua MODERATE (*)    RBC / HPF >50 (*)    WBC, UA >50 (*)    Bacteria, UA RARE (*)    Non Squamous Epithelial 0-5 (*)    All other components within normal limits  URINE CULTURE    EKG None  Radiology No results found.  Procedures Procedures    Medications Ordered in ED Medications  cefTRIAXone (ROCEPHIN) 2 g in sodium chloride 0.9 % 100 mL IVPB (has no administration in time range)  iohexol (OMNIPAQUE) 300 MG/ML solution 100 mL (has no administration in time range)  sodium chloride 0.9 % bolus 1,000 mL (1,000 mLs Intravenous New Bag/Given 04/11/22 0705)    ED Course/ Medical Decision Making/ A&P                           Medical Decision Making Amount and/or Complexity of Data Reviewed Labs: ordered. Radiology: ordered.  Risk Prescription drug management.  Patient overall appears well.  Technically meets sepsis criteria however he does not appear to be bacteremic.  Secondary to his white blood cell count being high his history of Pilo and his other associated neurologic history we will get a CT scan to evaluate for same.  Rocephin and fluids ordered.  If he continues to feel and appear well could be appropriate for discharge depending on CT scans and reevaluation.  Care transferred to Dr. Gilford Raid.  Final Clinical Impression(s) / ED Diagnoses Final diagnoses:  None    Rx / DC Orders ED Discharge Orders     None         Reily Ilic,  Corene Cornea, MD 04/11/22 [phone]

## 2022-04-11 NOTE — ED Triage Notes (Signed)
Pt reports "kidney pain" located bilaterally around lower back, foul smelling urine that is darker than normal, fever, and weakness that started yesterday. Pt reports feeling like he has a bladder infection. Pt took tylenol around 4am, reports temp at home was 99.8

## 2022-04-11 NOTE — ED Provider Notes (Signed)
Pt signed out by Dr. Dayna Barker pending CT.  CT scans reviewed by me.  I agree with the findings.  IMPRESSION:  1. Mild left hydroureteronephrosis with extensive urothelial  hyperenhancement involving the left ureter and to a lesser extent  the left renal pelvis. Correlation with urinalysis is recommended.  No overt imaging changes to clearly indicate pyelonephritis at this  time.  2. Moderate bilateral pleural effusions lying dependently.  3. Expansile lytic lesion in the right inferior pubic ramus, similar  to prior studies, likely to represent an osseous metastasis.  4. Aortic atherosclerosis.  5. Additional postoperative changes and incidental findings, as  above.    Pt has received 2 g Rocephin IV and fluids.  He feels much better.  He is able to drink and walk around the nurse's station.  He feels much better and wants to go home.   He did have a rash with cefepime in the past, but that was before he started on Xolair.  He did well with Rocephin since the Xolair and did well today.  Last culture results show sensitivity to cephalosporins, so I will d/c him with Omnicef.  Urine culture sent again today. He and his wife know to return if sx worsen.    Isla Pence, MD 04/11/22 [phone]

## 2022-04-11 NOTE — ED Provider Notes (Signed)
[facility] EMERGENCY DEPARTMENT Provider Note   CSN: [id number] Arrival date & time: 04/11/22  1045     History  Chief Complaint  Patient presents with   Weakness    [name] is a 81 y.o. male.  Pt is a 81 yo male with a pmhx significant for metastatic bladder cancer, GERD, and idiopathic urticaria.  He was seen earlier this am by me for fever.  He was found to have a UTI with a CT scan which showed inflammation in his ureter, but not his kidney.  He was feeling much better and wanted to go home.  I told him and his wife to return if he worsens.  The pt's wife said he felt bad again and started vomiting.  She checked his temp and it was 102.6.  She gave him tylenol at 0950.  She called up here to see what to do, and I told her to bring him back.  On exam now, he looks much worse than he did a few hrs ago.  He is more confused and is diaphoretic.       Home Medications Prior to Admission medications   Medication Sig Start Date End Date Taking? Authorizing Provider  acetaminophen (TYLENOL) 500 MG tablet Take 500 mg by mouth every 6 (six) hours as needed.    [provider]  cefdinir (OMNICEF) 300 MG capsule Take 1 capsule (300 mg total) by mouth 2 (two) times daily. 04/11/22   Isla Pence, MD  clotrimazole-betamethasone (LOTRISONE) cream Apply 1 application topically in the morning and at bedtime. 06/27/20   [provider]  fosfomycin (MONUROL) 3 g PACK Take 3 g x 1 then repeat dose in 2 days. 03/22/22   McKenzie, Candee Furbish, MD  HYDROmorphone (DILAUDID) 4 MG tablet Take 1 tablet (4 mg total) by mouth every 4 (four) hours as needed for severe pain. 03/23/22   [name], MD  lactose free nutrition (BOOST) LIQD Take 237 mLs by mouth 3 (three) times daily between meals.     [provider]  levothyroxine (SYNTHROID) 50 MCG tablet Take 50 mcg by mouth daily. 10/01/21   [provider]  oxyCODONE (OXY IR/ROXICODONE) 5 MG immediate release  tablet TAKE 1-2 TABLETS BY MOUTH EVERY (4) HOURS AS NEEDED. 03/23/22   [name], MD  polyethylene glycol (MIRALAX / GLYCOLAX) 17 g packet Take 17 g by mouth daily as needed for mild constipation. 07/18/20   Roxan Hockey, MD  predniSONE (DELTASONE) 10 MG tablet Take two tablets ('20mg'$ ) twice daily for three days, then one tablet ('10mg'$ ) twice daily for three days, then STOP. 03/31/22   Valentina Shaggy, MD      Allergies    Ciprofloxacin, Cefepime, and Zofran [ondansetron]    Review of Systems   Review of Systems  Constitutional:  Positive for fever.  Gastrointestinal:  Positive for nausea and vomiting.  All other systems reviewed and are negative.   Physical Exam Updated Vital Signs BP (!) 150/69   Pulse 98   Temp (!) 97.5 F (36.4 C) (Oral)   Resp (!) 35   Ht '5\' 11"'$  (1.803 m)   Wt 91.3 kg   SpO2 95%   BMI 28.06 kg/m  Physical Exam Vitals and nursing note reviewed.  Constitutional:      General: He is in acute distress.     Appearance: He is ill-appearing and diaphoretic.  HENT:     Head: Normocephalic and atraumatic.  Right Ear: External ear normal.     Left Ear: External ear normal.     Nose: Nose normal.     Mouth/Throat:     Mouth: Mucous membranes are dry.  Eyes:     Extraocular Movements: Extraocular movements intact.     Conjunctiva/sclera: Conjunctivae normal.     Pupils: Pupils are equal, round, and reactive to light.  Cardiovascular:     Rate and Rhythm: Regular rhythm. Tachycardia present.     Pulses: Normal pulses.     Heart sounds: Normal heart sounds.  Pulmonary:     Effort: Pulmonary effort is normal.     Breath sounds: Normal breath sounds.  Abdominal:     General: Abdomen is flat. Bowel sounds are normal.     Palpations: Abdomen is soft.     Comments: Urostomy bag noted.  Cloudy urine.  Musculoskeletal:        General: Normal range of motion.     Cervical back: Normal range of motion and neck supple.  Skin:    Capillary Refill:  Capillary refill takes 2 to 3 seconds.  Neurological:     General: No focal deficit present.     Mental Status: He is alert and oriented to person, place, and time.  Psychiatric:        Mood and Affect: Mood normal.        Behavior: Behavior normal.     ED Results / Procedures / Treatments   Labs (all labs ordered are listed, but only abnormal results are displayed) Labs Reviewed  LACTIC ACID, PLASMA - Abnormal; Notable for the following components:      Result Value   Lactic Acid, Venous 3.1 (*)    All other components within normal limits  URINALYSIS, ROUTINE W REFLEX MICROSCOPIC - Abnormal; Notable for the following components:   APPearance CLOUDY (*)    Specific Gravity, Urine 1.032 (*)    Hgb urine dipstick MODERATE (*)    Protein, ur 30 (*)    Leukocytes,Ua MODERATE (*)    WBC, UA >50 (*)    All other components within normal limits  COMPREHENSIVE METABOLIC PANEL - Abnormal; Notable for the following components:   Glucose, Bld 156 (*)    Calcium 8.6 (*)    Total Protein 6.4 (*)    Albumin 3.2 (*)    All other components within normal limits  CBC WITH DIFFERENTIAL/PLATELET - Abnormal; Notable for the following components:   WBC 23.6 (*)    RBC 4.11 (*)    MCV 105.4 (*)    MCH 35.3 (*)    Neutro Abs 21.2 (*)    Lymphs Abs 0.2 (*)    Monocytes Absolute 2.1 (*)    Abs Immature Granulocytes 0.15 (*)    All other components within normal limits  CULTURE, BLOOD (ROUTINE X 2)  RESP PANEL BY RT-PCR (FLU A&B, COVID) ARPGX2  CULTURE, BLOOD (ROUTINE X 2)  URINE CULTURE  PROTIME-INR  APTT  LACTIC ACID, PLASMA    EKG EKG Interpretation  Date/Time:  Sunday April 11 2022 11:31:10 EDT Ventricular Rate:  107 PR Interval:  160 QRS Duration: 81 QT Interval:  323 QTC Calculation: 431 R Axis:   -43 Text Interpretation: Sinus tachycardia Atrial premature complex Left anterior fascicular block Baseline wander in lead(s) II aVR No significant change since last tracing  Confirmed by Isla Pence [phone]) on 04/11/2022 11:39:53 AM  Radiology DG Chest Port 1 View  Result Date: 04/11/2022 CLINICAL DATA:  Weakness and  vomiting. High fever. Questionable sepsis. Evaluate for abnormality. History of metastatic urothelial carcinoma. EXAM: PORTABLE CHEST 1 VIEW COMPARISON:  Chest radiograph 12/07/2021 and PET-CT 03/15/2022 and CT abdomen 04/11/2022 FINDINGS: Haziness at the lung bases with blunting of the costophrenic angles. Findings compatible with known bilateral pleural effusions from recent abdominal CT. Chronic parenchymal densities at the right lung apex. Heart and mediastinum are within normal limits and stable. Negative for a pneumothorax. No acute bone abnormality. IMPRESSION: Haziness and blunting at the lung bases. Findings compatible with known bilateral pleural effusions. Chronic changes at the right lung apex. Electronically Signed   By: Markus Daft M.D.   On: 04/11/2022 11:36   CT ABDOMEN PELVIS W CONTRAST  Result Date: 04/11/2022 CLINICAL DATA:  80 year old male with history of acute onset of nonlocalized abdominal pain. Foul-smelling urine. Possible pyelonephritis. History of bladder cancer status post radical cystectomy and ileal conduit formation. * Tracking Code: BO * EXAM: CT ABDOMEN AND PELVIS WITH CONTRAST TECHNIQUE: Multidetector CT imaging of the abdomen and pelvis was performed using the standard protocol following bolus administration of intravenous contrast. RADIATION DOSE REDUCTION: This exam was performed according to the departmental dose-optimization program which includes automated exposure control, adjustment of the mA and/or kV according to patient size and/or use of iterative reconstruction technique. CONTRAST:  140m OMNIPAQUE IOHEXOL 300 MG/ML  SOLN COMPARISON:  CT of the abdomen and pelvis 12/25/2021. PET-CT 03/15/2022. FINDINGS: Lower chest: Calcifications of the aortic valve. Atherosclerotic calcifications in the descending thoracic aorta  as well as the distal right coronary artery. Moderate bilateral pleural effusions lying dependently. Small hiatal hernia. Hepatobiliary: Multiple low-attenuation hepatic lesions, largest of which are compatible with simple cysts, measuring up to 2.7 x 2.2 cm in segment 6 of the liver (axial image 37 of series 2). Other subcentimeter low-attenuation hepatic lesions are too small to definitively characterize, but statistically likely to represent tiny cysts or biliary hamartomas (no imaging follow-up is recommended). The appearance of the gallbladder is normal. Pancreas: No pancreatic mass. No pancreatic ductal dilatation. No pancreatic or peripancreatic fluid collections or inflammatory changes. Spleen: Unremarkable. Adrenals/Urinary Tract: Status radical cystoprostatectomy with right lower quadrant ileostomy. Left-sided hydroureteronephrosis which extends all the way to the level of the ileal conduit. Urothelial hyperenhancement throughout the left ureter, and within the left renal collecting system. Enhancement pattern of the left kidney is grossly unremarkable. Specifically, no overt hypovascular areas to clearly indicate acute pyelonephritis at this time. Subcentimeter low-attenuation lesion in the upper pole of the right kidney, too small to characterize, but statistically likely tiny cysts (no imaging follow-up is recommended). No right hydroureteronephrosis. Bilateral adrenal glands are normal in appearance. Stomach/Bowel: Intra-abdominal portion of the stomach is unremarkable. No pathologic dilatation of small bowel or colon. The appendix is not confidently identified and may be surgically absent. Regardless, there are no inflammatory changes noted adjacent to the cecum to suggest the presence of an acute appendicitis at this time. Vascular/Lymphatic: Atherosclerotic calcifications throughout the abdominal and pelvic vasculature, without definite aneurysm or dissection. No lymphadenopathy noted in the abdomen  or pelvis. Reproductive: Status post radical cystoprostatectomy. Other: No significant volume of ascites.  No pneumoperitoneum. Musculoskeletal: Expansile lytic lesion in the right inferior pubic ramus extending into the overlying soft tissues estimated to measure approximately 4.5 x 5.3 cm (axial image 92 of series 2), similar to prior studies. No other new suspicious appearing lytic or blastic lesions are noted elsewhere in the visualized portions of the skeleton. IMPRESSION: 1. Mild left hydroureteronephrosis with extensive urothelial  hyperenhancement involving the left ureter and to a lesser extent the left renal pelvis. Correlation with urinalysis is recommended. No overt imaging changes to clearly indicate pyelonephritis at this time. 2. Moderate bilateral pleural effusions lying dependently. 3. Expansile lytic lesion in the right inferior pubic ramus, similar to prior studies, likely to represent an osseous metastasis. 4. Aortic atherosclerosis. 5. Additional postoperative changes and incidental findings, as above. Electronically Signed   By: Vinnie Langton M.D.   On: 04/11/2022 07:48    Procedures Procedures    Medications Ordered in ED Medications  lactated ringers infusion (has no administration in time range)  ertapenem (INVANZ) 1,000 mg in sodium chloride 0.9 % 100 mL IVPB (has no administration in time range)  lactated ringers bolus 1,000 mL (1,000 mLs Intravenous New Bag/Given 04/11/22 1205)    And  lactated ringers bolus 1,000 mL (1,000 mLs Intravenous New Bag/Given 04/11/22 1204)    And  lactated ringers bolus 1,000 mL (1,000 mLs Intravenous New Bag/Given 04/11/22 1207)  piperacillin-tazobactam (ZOSYN) IVPB 3.375 g (0 g Intravenous Stopped 04/11/22 1226)    ED Course/ Medical Decision Making/ A&P                           Medical Decision Making Amount and/or Complexity of Data Reviewed Labs: ordered. Radiology: ordered. ECG/medicine tests: ordered.  Risk Prescription drug  management. Decision regarding hospitalization.   This patient presents to the ED for concern of sepsis, this involves an extensive number of treatment options, and is a complaint that carries with it a high risk of complications and morbidity.  The differential diagnosis includes urosepsis vs other source   Co morbidities that complicate the patient evaluation  metastatic bladder cancer, GERD, and idiopathic urticaria   Additional history obtained:  Additional history obtained from epic chart review External records from outside source obtained and reviewed including wife   Lab Tests:  I Ordered, and personally interpreted labs.  The pertinent results include:  cbc with wbc more elevated at 23.6; cmp nl; ua +, lactic elevated at 3.1   Imaging Studies ordered:  I ordered imaging studies including cxr  I independently visualized and interpreted imaging which showed  IMPRESSION:  Haziness and blunting at the lung bases. Findings compatible with  known bilateral pleural effusions.    Chronic changes at the right lung apex.   I agree with the radiologist interpretation   Cardiac Monitoring:  The patient was maintained on a cardiac monitor.  I personally viewed and interpreted the cardiac monitored which showed an underlying rhythm of: sinus tachy   Medicines ordered and prescription drug management:  I ordered medication including zosyn  for abx  Reevaluation of the patient after these medicines showed that the patient stayed the same I have reviewed the patients home medicines and have made adjustments as needed   Critical Interventions:  IVFs and IV abx   Consultations Obtained:  I requested consultation with the hospitalist (Dr. Maurene Capes),  and discussed lab and imaging findings as well as pertinent plan - he will admit   Problem List / ED Course:  Urosepsis:  Code sepsis called.  Pt given sepsis fluids.  Pt already received 2 g of Rocephin a few hrs ago.   His condition has worsened, so I spoke with the pharmacist who recommends adding Zosyn.   Reevaluation:  After the interventions noted above, I reevaluated the patient and found that they have :improved   Social Determinants  of Health:  Lives at home with wife   Dispostion:  After consideration of the diagnostic results and the patients response to treatment, I feel that the patent would benefit from admission.  CRITICAL CARE Performed by: Isla Pence   Total critical care time: 30 minutes  Critical care time was exclusive of separately billable procedures and treating other patients.  Critical care was necessary to treat or prevent imminent or life-threatening deterioration.  Critical care was time spent personally by me on the following activities: development of treatment plan with patient and/or surrogate as well as nursing, discussions with consultants, evaluation of patient's response to treatment, examination of patient, obtaining history from patient or surrogate, ordering and performing treatments and interventions, ordering and review of laboratory studies, ordering and review of radiographic studies, pulse oximetry and re-evaluation of patient's condition.           Final Clinical Impression(s) / ED Diagnoses Final diagnoses:  Urinary tract infection with hematuria, site unspecified  Sepsis, due to unspecified organism, unspecified whether acute organ dysfunction present [facility])    Rx / DC Orders ED Discharge Orders     None         Isla Pence, MD 04/11/22 1325

## 2022-04-11 NOTE — H&P (Signed)
Patient Demographics:    [name], is a 81 y.o. male  MRN: [id number]   DOB - 25-Mar-1941  Admit Date - 04/11/2022  Outpatient Primary MD for the patient is [name], MD   Assessment & Plan:   Assessment and Plan:  1)Sepsis secondary to presumed urinary source--POA -patient with persistent fevers (up to 103.3) -Tachycardia, tachypnea and leukocytosis (-WBC is up to 23.6 from 15.6 within a 6-hour interval) --Lactic acid is down to 2.3 from 3.1 after IV fluids -UA suggestive for UTI -Blood and urine culture pending -Urine culture from 03/17/2022 with MDR Klebsiella pneumoniae -Urine culture from 03/03/2022 with MD Pseudomonas -Treat empirically with ertapenem pending further culture data -Aggressive IV fluids per sepsis protocol   2) history of stage IV bladder cancer with metastatic to the lung and bones -Patient's stage IV high-grade urothelial carcinoma was originally diagnosed 2018 with pulmonary and bone involvement. T3bN0 disease. This tumor found to have PDL 1 positive  with CPS score over 90%.  -Patient follows with Dr. Sol Passer -Patient previously had  laparoscopic Cystoprostatectomy and bilateral lymphadenectomy done by Dr. Tresa Moore on 06/29/2017 -He completed radiation therapy to the pelvis on January 16, 2018 under the care of Dr. Tammi Klippel. He is status post radiation therapy to the right paratracheal lymph node for a total of 50 Gray in 10 fractions completed on April 23, 2020.  He is status post definitive radiation to intrathoracic lymph node completed on November 27, 2021.  He received 40 Gray in 10 fractions. 04/11/22---CT abdomen and pelvis shows mild left hydro ureteral nephrosis with extensive urothelial hyperenhancement involving the left ureter until this stent in the left renal  pelvis, moderate bilateral pleural effusions, possible lytic lesion in the right inferior pubic rami-similar to prior studies suggestive of osseous metastases  3)Hypothyroidism--- history of noncompliance-check TSH -Continue levothyroxine  4)COPD--- patient presented with some wheezing, give bronchodilators -Hold off on steroids at this time -Chest x-ray without pneumonia  Disposition/Need for in-Hospital Stay- patient unable to be discharged at this time due to --sepsis requiring IV antibiotics and IV fluids  Status is: Inpatient  Remains inpatient appropriate because:   Dispo: The patient is from: Home              Anticipated d/c is to: Home              Anticipated d/c date is: 3 days              Patient currently is not medically stable to d/c. Barriers: Not Clinically Stable-    With History of - Reviewed by me  Past Medical History:  Diagnosis Date   Bladder cancer ([place])    Bone cancer ([place])    BPH (benign prostatic hypertrophy)    Dysuria    GERD (gastroesophageal reflux disease)    History of adenomatous polyp of colon    tubular adenoma 06/ 2018   History of bladder cancer 12/2014  urologist-  dr [name]--  dx High Grade TCC without stromal invasion s/p TURBT and intravesical BCG tx/  recurrent 07/2015 (pTa) TCC  repear intravesical BCG tx    History of hiatal hernia    History of urethral stricture    Nocturia       Past Surgical History:  Procedure Laterality Date   CARDIAC CATHETERIZATION  1997   normal coronary arteries   CARDIOVASCULAR STRESS TEST  12-14-2005   Low risk perfusion study/  very small scar in the inferior septum from mid ventricle to apex,  no ischemia/  mild inferior septal hypokinesis/  ef 58%   CATARACT EXTRACTION W/ INTRAOCULAR LENS  IMPLANT, BILATERAL  2011   COLONOSCOPY  last one 06/ 2018   CYSTOSCOPY WITH BIOPSY N/A 08/12/2015   Procedure: CYSTOSCOPY WITH BIOPSY AND FULGERATION;  Surgeon: [name], MD;  Location: [facility];  Service: Urology;  Laterality: N/A;   CYSTOSCOPY WITH INJECTION N/A 06/29/2017   Procedure: CYSTOSCOPY WITH INJECTION OF INDOCYANINE GREEN DYE;  Surgeon: Alexis Frock, MD;  Location: [facility];  Service: Urology;  Laterality: N/A;   CYSTOSCOPY WITH URETHRAL DILATATION N/A 03/21/2017   Procedure: CYSTOSCOPY;  Surgeon: [name], MD;  Location: [facility];  Service: Urology;  Laterality: N/A;   ESOPHAGOGASTRODUODENOSCOPY  12-05-2014   IR IMAGING GUIDED PORT INSERTION  02/07/2018   IR NEPHROSTOMY PLACEMENT LEFT  12/25/2021   IR REMOVAL TUN ACCESS W/ PORT W/O FL MOD SED  04/14/2018   TRANSTHORACIC ECHOCARDIOGRAM  01-31-2008   nomral LVF,  ef 55-65%/  mild pulmonic stenosis without regurg.,  peak transpulomonic valve gradient 49mHg   TRANSURETHRAL RESECTION OF BLADDER TUMOR N/A 12/31/2014   Procedure: TRANSURETHRAL RESECTION OF BLADDER TUMOR (TURBT);  Surgeon: MLowella Bandy MD;  [facility]  Service: Urology;  Laterality: N/A;   TRANSURETHRAL RESECTION OF BLADDER TUMOR N/A 03/21/2017   Procedure: POSSIBLE TRANSURETHRAL RESECTION OF BLADDER TUMOR (TURBT);  Surgeon: BNickie Retort MD;  [facility]  Service: Urology;  Laterality: N/A;   Chief Complaint  Patient presents with   Weakness      HPI:    [name] is a 81y.o. male with past medical history relevant for h/o bladder cancer status post cystectomy/ileal conduit, recurrent urinary tract infections with MDR, hypothyroidism, BPH and GERD who presents to the ED with fevers nausea, vomiting and malaise -Patient was initially seen in the ED on 04/10/2022 treated and released for presumed UTI -Return to the ED on 04/11/2022 with worsening symptoms and persistent fevers, as well as flank pain/lower back pain and foul-smelling urine -CT abdomen and pelvis shows mild left hydro ureteral nephrosis with extensive urothelial hyperenhancement involving the  left ureter until this stent in the left renal pelvis, moderate bilateral pleural effusions, possible lytic lesion in the right inferior pubic rami-similar to prior studies suggestive of osseous metastases --Chest x-ray  with chronic right apical changes and bilateral nares pleural effusions, no definite pneumonia -Lactic acid is down to 2.3 from 3.1 after IV fluids -UA suggestive for UTI urine culture pending -WBC is up to 23.6 from 15.6 within a 6-hour interval -Creatinine 0.94, LFTs are not elevated -Patient with recent history of C. difficile infection in May 2023    Review of systems:    In addition to the HPI above,   A full Review of  Systems was done, all other systems reviewed are negative except as noted above in  HPI , .    Social History:  Reviewed by me    Social History   Tobacco Use   Smoking status: Former    Packs/day: 1.00    Years: 34.00    Total pack years: 34.00    Types: Cigarettes    Quit date: 12/26/1988    Years since quitting: 33.3   Smokeless tobacco: Never  Substance Use Topics   Alcohol use: Yes    Alcohol/week: 6.0 standard drinks of alcohol    Types: 6 Cans of beer per week    Comment: BEER     Family History :  Reviewed by me    Family History  Problem Relation Age of Onset   Alcoholism Father    Colon cancer Neg Hx    Colon polyps Neg Hx    Diabetes Neg Hx    Kidney disease Neg Hx    Esophageal cancer Neg Hx    Heart disease Neg Hx    Gallbladder disease Neg Hx    Allergic rhinitis Neg Hx    Angioedema Neg Hx    Asthma Neg Hx    Atopy Neg Hx    Eczema Neg Hx    Immunodeficiency Neg Hx    Urticaria Neg Hx      Home Medications:   Prior to Admission medications   Medication Sig Start Date End Date Taking? Authorizing Provider  acetaminophen (TYLENOL) 500 MG tablet Take 500 mg by mouth every 6 (six) hours as needed.    [provider]  cefdinir (OMNICEF) 300 MG capsule Take 1 capsule (300 mg total) by mouth 2 (two)  times daily. 04/11/22   Isla Pence, MD  clotrimazole-betamethasone (LOTRISONE) cream Apply 1 application topically in the morning and at bedtime. 06/27/20   [provider]  fosfomycin (MONUROL) 3 g PACK Take 3 g x 1 then repeat dose in 2 days. 03/22/22   McKenzie, Candee Furbish, MD  HYDROmorphone (DILAUDID) 4 MG tablet Take 1 tablet (4 mg total) by mouth every 4 (four) hours as needed for severe pain. 03/23/22   [name], MD  lactose free nutrition (BOOST) LIQD Take 237 mLs by mouth 3 (three) times daily between meals.     [provider]  levothyroxine (SYNTHROID) 50 MCG tablet Take 50 mcg by mouth daily. 10/01/21   [provider]  oxyCODONE (OXY IR/ROXICODONE) 5 MG immediate release tablet TAKE 1-2 TABLETS BY MOUTH EVERY (4) HOURS AS NEEDED. 03/23/22   [name], MD  polyethylene glycol (MIRALAX / GLYCOLAX) 17 g packet Take 17 g by mouth daily as needed for mild constipation. 07/18/20   Roxan Hockey, MD  predniSONE (DELTASONE) 10 MG tablet Take two tablets ('20mg'$ ) twice daily for three days, then one tablet ('10mg'$ ) twice daily for three days, then STOP. 03/31/22   Valentina Shaggy, MD     Allergies:     Allergies  Allergen Reactions   Ciprofloxacin Hives   Cefepime Rash   Zofran [Ondansetron] Rash     Physical Exam:   Vitals  Blood pressure 130/83, pulse (!) 115, temperature (!) 103.3 F (39.6 C), temperature source Rectal, resp. rate (!) 37, height '5\' 11"'$  (1.803 m), weight 91.3 kg, SpO2 94 %.  Physical Examination: General appearance - alert, ill-appearing,   Mental status - alert, oriented to person, place, and time,  Eyes - sclera anicteric Neck - supple, no JVD elevation , Chest -diminished breath sounds, scattered wheezes , tachypnea heart - S1 and S2  normal, regular , tachycardia Abdomen - soft, nontender, nondistended, +BS, ileal conduit with cloudy urine Neurological - screening mental status exam normal, neck supple without  rigidity, cranial nerves II through XII intact, DTR's normal and symmetric Extremities - no pedal edema noted, intact peripheral pulses  Skin - warm, dry..   Data Review:    CBC Recent Labs  Lab 04/11/22 0535 04/11/22 1113  WBC 15.6* 23.6*  HGB 15.0 14.5  HCT 44.5 43.3  PLT 185 200  MCV 105.5* 105.4*  MCH 35.5* 35.3*  MCHC 33.7 33.5  RDW 13.2 13.0  LYMPHSABS 0.4* 0.2*  MONOABS 1.3* 2.1*  EOSABS 0.1 0.0  BASOSABS 0.1 0.1   ------------------------------------------------------------------------------------------------------------------  Chemistries  Recent Labs  Lab 04/11/22 0535 04/11/22 1113  NA 140 138  K 3.9 3.7  CL 107 105  CO2 26 24  GLUCOSE 124* 156*  BUN 14 12  CREATININE 0.95 0.94  CALCIUM 8.6* 8.6*  AST 16 20  ALT 19 21  ALKPHOS 62 59  BILITOT 1.2 0.9   ------------------------------------------------------------------------------------------------------------------ estimated creatinine clearance is 71.2 mL/min (by C-G formula based on SCr of 0.94 mg/dL). ------------------------------------------------------------------------------------------------------------------ Coagulation profile Recent Labs  Lab 04/11/22 1113  INR 1.1    ------------------------------------------------------------------------------------------------------------------    Component Value Date/Time   BNP 37.0 02/05/2021 1216   Urinalysis    Component Value Date/Time   COLORURINE YELLOW 04/11/2022 1204   APPEARANCEUR CLOUDY (A) 04/11/2022 1204   APPEARANCEUR Hazy (A) 03/17/2022 1608   LABSPEC 1.032 (H) 04/11/2022 1204   PHURINE 6.0 04/11/2022 1204   GLUCOSEU NEGATIVE 04/11/2022 1204   HGBUR MODERATE (A) 04/11/2022 1204   BILIRUBINUR NEGATIVE 04/11/2022 1204   BILIRUBINUR Negative 03/17/2022 1608   KETONESUR NEGATIVE 04/11/2022 1204   PROTEINUR 30 (A) 04/11/2022 1204   NITRITE NEGATIVE 04/11/2022 1204   LEUKOCYTESUR MODERATE (A) 04/11/2022 1204     ----------------------------------------------------------------------------------------------------------------   Imaging Results:    DG Chest Port 1 View  Result Date: 04/11/2022 CLINICAL DATA:  Weakness and vomiting. High fever. Questionable sepsis. Evaluate for abnormality. History of metastatic urothelial carcinoma. EXAM: PORTABLE CHEST 1 VIEW COMPARISON:  Chest radiograph 12/07/2021 and PET-CT 03/15/2022 and CT abdomen 04/11/2022 FINDINGS: Haziness at the lung bases with blunting of the costophrenic angles. Findings compatible with known bilateral pleural effusions from recent abdominal CT. Chronic parenchymal densities at the right lung apex. Heart and mediastinum are within normal limits and stable. Negative for a pneumothorax. No acute bone abnormality. IMPRESSION: Haziness and blunting at the lung bases. Findings compatible with known bilateral pleural effusions. Chronic changes at the right lung apex. Electronically Signed   By: Markus Daft M.D.   On: 04/11/2022 11:36   CT ABDOMEN PELVIS W CONTRAST  Result Date: 04/11/2022 CLINICAL DATA:  81 year old male with history of acute onset of nonlocalized abdominal pain. Foul-smelling urine. Possible pyelonephritis. History of bladder cancer status post radical cystectomy and ileal conduit formation. * Tracking Code: BO * EXAM: CT ABDOMEN AND PELVIS WITH CONTRAST TECHNIQUE: Multidetector CT imaging of the abdomen and pelvis was performed using the standard protocol following bolus administration of intravenous contrast. RADIATION DOSE REDUCTION: This exam was performed according to the departmental dose-optimization program which includes automated exposure control, adjustment of the mA and/or kV according to patient size and/or use of iterative reconstruction technique. CONTRAST:  149m OMNIPAQUE IOHEXOL 300 MG/ML  SOLN COMPARISON:  CT of the abdomen and pelvis 12/25/2021. PET-CT 03/15/2022. FINDINGS: Lower chest: Calcifications of the aortic  valve. Atherosclerotic calcifications in the descending thoracic aorta  as well as the distal right coronary artery. Moderate bilateral pleural effusions lying dependently. Small hiatal hernia. Hepatobiliary: Multiple low-attenuation hepatic lesions, largest of which are compatible with simple cysts, measuring up to 2.7 x 2.2 cm in segment 6 of the liver (axial image 37 of series 2). Other subcentimeter low-attenuation hepatic lesions are too small to definitively characterize, but statistically likely to represent tiny cysts or biliary hamartomas (no imaging follow-up is recommended). The appearance of the gallbladder is normal. Pancreas: No pancreatic mass. No pancreatic ductal dilatation. No pancreatic or peripancreatic fluid collections or inflammatory changes. Spleen: Unremarkable. Adrenals/Urinary Tract: Status radical cystoprostatectomy with right lower quadrant ileostomy. Left-sided hydroureteronephrosis which extends all the way to the level of the ileal conduit. Urothelial hyperenhancement throughout the left ureter, and within the left renal collecting system. Enhancement pattern of the left kidney is grossly unremarkable. Specifically, no overt hypovascular areas to clearly indicate acute pyelonephritis at this time. Subcentimeter low-attenuation lesion in the upper pole of the right kidney, too small to characterize, but statistically likely tiny cysts (no imaging follow-up is recommended). No right hydroureteronephrosis. Bilateral adrenal glands are normal in appearance. Stomach/Bowel: Intra-abdominal portion of the stomach is unremarkable. No pathologic dilatation of small bowel or colon. The appendix is not confidently identified and may be surgically absent. Regardless, there are no inflammatory changes noted adjacent to the cecum to suggest the presence of an acute appendicitis at this time. Vascular/Lymphatic: Atherosclerotic calcifications throughout the abdominal and pelvic vasculature, without  definite aneurysm or dissection. No lymphadenopathy noted in the abdomen or pelvis. Reproductive: Status post radical cystoprostatectomy. Other: No significant volume of ascites.  No pneumoperitoneum. Musculoskeletal: Expansile lytic lesion in the right inferior pubic ramus extending into the overlying soft tissues estimated to measure approximately 4.5 x 5.3 cm (axial image 92 of series 2), similar to prior studies. No other new suspicious appearing lytic or blastic lesions are noted elsewhere in the visualized portions of the skeleton. IMPRESSION: 1. Mild left hydroureteronephrosis with extensive urothelial hyperenhancement involving the left ureter and to a lesser extent the left renal pelvis. Correlation with urinalysis is recommended. No overt imaging changes to clearly indicate pyelonephritis at this time. 2. Moderate bilateral pleural effusions lying dependently. 3. Expansile lytic lesion in the right inferior pubic ramus, similar to prior studies, likely to represent an osseous metastasis. 4. Aortic atherosclerosis. 5. Additional postoperative changes and incidental findings, as above. Electronically Signed   By: Vinnie Langton M.D.   On: 04/11/2022 07:48    Radiological Exams on Admission: DG Chest Port 1 View  Result Date: 04/11/2022 CLINICAL DATA:  Weakness and vomiting. High fever. Questionable sepsis. Evaluate for abnormality. History of metastatic urothelial carcinoma. EXAM: PORTABLE CHEST 1 VIEW COMPARISON:  Chest radiograph 12/07/2021 and PET-CT 03/15/2022 and CT abdomen 04/11/2022 FINDINGS: Haziness at the lung bases with blunting of the costophrenic angles. Findings compatible with known bilateral pleural effusions from recent abdominal CT. Chronic parenchymal densities at the right lung apex. Heart and mediastinum are within normal limits and stable. Negative for a pneumothorax. No acute bone abnormality. IMPRESSION: Haziness and blunting at the lung bases. Findings compatible with known  bilateral pleural effusions. Chronic changes at the right lung apex. Electronically Signed   By: Markus Daft M.D.   On: 04/11/2022 11:36   CT ABDOMEN PELVIS W CONTRAST  Result Date: 04/11/2022 CLINICAL DATA:  81 year old male with history of acute onset of nonlocalized abdominal pain. Foul-smelling urine. Possible pyelonephritis. History of bladder cancer status post radical cystectomy and ileal  conduit formation. * Tracking Code: BO * EXAM: CT ABDOMEN AND PELVIS WITH CONTRAST TECHNIQUE: Multidetector CT imaging of the abdomen and pelvis was performed using the standard protocol following bolus administration of intravenous contrast. RADIATION DOSE REDUCTION: This exam was performed according to the departmental dose-optimization program which includes automated exposure control, adjustment of the mA and/or kV according to patient size and/or use of iterative reconstruction technique. CONTRAST:  125m OMNIPAQUE IOHEXOL 300 MG/ML  SOLN COMPARISON:  CT of the abdomen and pelvis 12/25/2021. PET-CT 03/15/2022. FINDINGS: Lower chest: Calcifications of the aortic valve. Atherosclerotic calcifications in the descending thoracic aorta as well as the distal right coronary artery. Moderate bilateral pleural effusions lying dependently. Small hiatal hernia. Hepatobiliary: Multiple low-attenuation hepatic lesions, largest of which are compatible with simple cysts, measuring up to 2.7 x 2.2 cm in segment 6 of the liver (axial image 37 of series 2). Other subcentimeter low-attenuation hepatic lesions are too small to definitively characterize, but statistically likely to represent tiny cysts or biliary hamartomas (no imaging follow-up is recommended). The appearance of the gallbladder is normal. Pancreas: No pancreatic mass. No pancreatic ductal dilatation. No pancreatic or peripancreatic fluid collections or inflammatory changes. Spleen: Unremarkable. Adrenals/Urinary Tract: Status radical cystoprostatectomy with right lower  quadrant ileostomy. Left-sided hydroureteronephrosis which extends all the way to the level of the ileal conduit. Urothelial hyperenhancement throughout the left ureter, and within the left renal collecting system. Enhancement pattern of the left kidney is grossly unremarkable. Specifically, no overt hypovascular areas to clearly indicate acute pyelonephritis at this time. Subcentimeter low-attenuation lesion in the upper pole of the right kidney, too small to characterize, but statistically likely tiny cysts (no imaging follow-up is recommended). No right hydroureteronephrosis. Bilateral adrenal glands are normal in appearance. Stomach/Bowel: Intra-abdominal portion of the stomach is unremarkable. No pathologic dilatation of small bowel or colon. The appendix is not confidently identified and may be surgically absent. Regardless, there are no inflammatory changes noted adjacent to the cecum to suggest the presence of an acute appendicitis at this time. Vascular/Lymphatic: Atherosclerotic calcifications throughout the abdominal and pelvic vasculature, without definite aneurysm or dissection. No lymphadenopathy noted in the abdomen or pelvis. Reproductive: Status post radical cystoprostatectomy. Other: No significant volume of ascites.  No pneumoperitoneum. Musculoskeletal: Expansile lytic lesion in the right inferior pubic ramus extending into the overlying soft tissues estimated to measure approximately 4.5 x 5.3 cm (axial image 92 of series 2), similar to prior studies. No other new suspicious appearing lytic or blastic lesions are noted elsewhere in the visualized portions of the skeleton. IMPRESSION: 1. Mild left hydroureteronephrosis with extensive urothelial hyperenhancement involving the left ureter and to a lesser extent the left renal pelvis. Correlation with urinalysis is recommended. No overt imaging changes to clearly indicate pyelonephritis at this time. 2. Moderate bilateral pleural effusions lying  dependently. 3. Expansile lytic lesion in the right inferior pubic ramus, similar to prior studies, likely to represent an osseous metastasis. 4. Aortic atherosclerosis. 5. Additional postoperative changes and incidental findings, as above. Electronically Signed   By: DVinnie LangtonM.D.   On: 04/11/2022 07:48    DVT Prophylaxis -SCD/Heparin AM Labs Ordered, also please review Full Orders  Family Communication: Admission, patients condition and plan of care including tests being ordered have been discussed with the patient and wife who indicate understanding and agree with the plan   Condition  -stable  CRoxan HockeyM.D on 04/11/2022 at 4:47 PM Go to [email or web address] -  for contact info  [facility] -  Office  [phone]

## 2022-04-12 ENCOUNTER — Inpatient Hospital Stay (HOSPITAL_COMMUNITY): Payer: Medicare Other

## 2022-04-12 ENCOUNTER — Other Ambulatory Visit: Payer: Self-pay

## 2022-04-12 DIAGNOSIS — C679 Malignant neoplasm of bladder, unspecified: Secondary | ICD-10-CM | POA: Diagnosis not present

## 2022-04-12 DIAGNOSIS — A419 Sepsis, unspecified organism: Secondary | ICD-10-CM | POA: Diagnosis not present

## 2022-04-12 DIAGNOSIS — J449 Chronic obstructive pulmonary disease, unspecified: Secondary | ICD-10-CM | POA: Diagnosis not present

## 2022-04-12 DIAGNOSIS — Z936 Other artificial openings of urinary tract status: Secondary | ICD-10-CM | POA: Diagnosis not present

## 2022-04-12 LAB — COMPREHENSIVE METABOLIC PANEL
ALT: 13 U/L (ref 0–44)
AST: 11 U/L — ABNORMAL LOW (ref 15–41)
Albumin: 2.3 g/dL — ABNORMAL LOW (ref 3.5–5.0)
Alkaline Phosphatase: 39 U/L (ref 38–126)
Anion gap: 4 — ABNORMAL LOW (ref 5–15)
BUN: 11 mg/dL (ref 8–23)
CO2: 27 mmol/L (ref 22–32)
Calcium: 8 mg/dL — ABNORMAL LOW (ref 8.9–10.3)
Chloride: 109 mmol/L (ref 98–111)
Creatinine, Ser: 0.83 mg/dL (ref 0.61–1.24)
GFR, Estimated: >60 mL/min (ref >60)
Glucose, Bld: 100 mg/dL — ABNORMAL HIGH (ref 70–99)
Potassium: 3.6 mmol/L (ref 3.5–5.1)
Sodium: 140 mmol/L (ref 135–145)
Total Bilirubin: 0.8 mg/dL (ref 0.3–1.2)
Total Protein: 4.8 g/dL — ABNORMAL LOW (ref 6.5–8.1)

## 2022-04-12 MED ORDER — IPRATROPIUM-ALBUTEROL 0.5-2.5 (3) MG/3ML IN SOLN
3.0000 mL | Freq: Four times a day (QID) | RESPIRATORY_TRACT | Status: DC | PRN
  Administered 2022-04-13 – 2022-04-14 (×3): 3 mL via RESPIRATORY_TRACT
  Filled 2022-04-12 (×3): qty 3

## 2022-04-12 MED ORDER — SODIUM CHLORIDE 0.9 % IV SOLN: INTRAVENOUS | Status: AC

## 2022-04-12 NOTE — Progress Notes (Signed)
PROGRESS NOTE     [name], is a 81 y.o. male, DOB - [birth year], QQV:[id number]  Admit date - 04/11/2022   Admitting Physician Lance Pillsbury [place] Brick, MD  Outpatient Primary MD for the patient is Lance School, MD  LOS - 1  Chief Complaint  Patient presents with   Weakness        Brief Narrative:   81 y.o. male with past medical history relevant for h/o bladder cancer status post cystectomy/ileal conduit, recurrent urinary tract infections with MDR, hypothyroidism, BPH and GERD admitted on 04/11/2022 with sepsis secondary to urinary source    -Assessment and Plan: 1)Sepsis secondary to presumed urinary source--POA -patient met sepsis criteria on admission with with persistent fevers (up to 103.3), Tachycardia, tachypnea and leukocytosis (-WBC is up to 23.6 from 15.6 within a 6-hour interval) --Lactic acid is down to 2.3 from 3.1 after IV fluids -UA suggestive for UTI -Blood and urine culture pending -Urine culture from 03/17/2022 with MDR Klebsiella pneumoniae -Urine culture from 03/03/2022 with MD Pseudomonas -Continue IV ertapenem pending further culture data -Received IV fluids per sepsis protocol WBC 15.6 >>23.6>>11.8   2) history of stage IV bladder cancer with metastatic to the lung and bones -Patient's stage IV high-grade urothelial carcinoma was originally diagnosed 2018 with pulmonary and bone involvement. T3bN0 disease. This tumor found to have PDL 1 positive  with CPS score over 90%.  -Patient follows with [name] -Patient previously had  laparoscopic Cystoprostatectomy and bilateral lymphadenectomy done by [name] on 06/29/2017 -He completed radiation therapy to the pelvis on January 16, 2018 under the care of [name]. He is status post radiation therapy to the right paratracheal lymph node for a total of 50 Gray in 10 fractions completed on April 23, 2020.  He is status post definitive radiation to intrathoracic lymph node completed on November 27, 2021.  He received 40 Gray in 10 fractions. 04/11/22---CT abdomen and pelvis shows mild left hydro ureteral nephrosis with extensive urothelial hyperenhancement involving the left ureter until this stent in the left renal pelvis, moderate bilateral pleural effusions, possible lytic lesion in the right inferior pubic rami-similar to prior studies suggestive of osseous metastases   3)Hypothyroidism--- TSH WNL at 1.34 -Continue levothyroxine   4)COPD--- patient presented with some wheezing -Wheezing resolved with bronchodilators -Hold off on steroids at this time -Chest x-ray without pneumonia  5) acute anemia--suspect related to hemodilution in setting of IV fluids per sepsis protocol ---no evidence of ongoing bleeding -Monitor closely    Disposition/Need for in-Hospital Stay- patient unable to be discharged at this time due to --sepsis requiring IV antibiotics and IV fluids -Given history of recurrent MDR bacterial infection unable to discharge home until culture data is available to guide antibiotic choice   Status is: Inpatient   Remains inpatient appropriate because:    Dispo: The patient is from: Home              Anticipated d/c is to: Home              Anticipated d/c date is: 2 days              Patient currently is not medically stable to d/c. Barriers: Not Clinically Stable-    Code Status :  -  Code Status: Full Code   Family Communication:  -Discussed with his wife at bedside  DVT Prophylaxis  :   - SCDs   heparin injection 5,000 Units Start: 04/11/22 1645 SCDs Start: 04/11/22 1637 Place  TED hose Start: 04/11/22 1637   Lab Results  Component Value Date   PLT 126 (L) 04/12/2022    Inpatient Medications  Scheduled Meds:  heparin  5,000 Units Subcutaneous Q8H   lactose free nutrition  237 mL Oral TID BM   levothyroxine  50 mcg Oral Q0600   sodium chloride flush  3 mL Intravenous Q12H   sodium chloride flush  3 mL Intravenous Q12H   Continuous Infusions:   sodium chloride     sodium chloride 75 mL/hr at 04/12/22 0908   ertapenem 1,000 mg (04/12/22 1331)   PRN Meds:.sodium chloride, acetaminophen, acetaminophen, bisacodyl, ipratropium-albuterol, oxyCODONE, polyethylene glycol, prochlorperazine, sodium chloride flush, traZODone   Anti-infectives (From admission, onward)    Start     Dose/Rate Route Frequency Ordered Stop   04/11/22 1315  ertapenem (INVANZ) 1,000 mg in sodium chloride 0.9 % 100 mL IVPB        1 g 200 mL/hr over 30 Minutes Intravenous Every 24 hours 04/11/22 1303     04/11/22 1145  piperacillin-tazobactam (ZOSYN) IVPB 3.375 g        3.375 g 100 mL/hr over 30 Minutes Intravenous  Once 04/11/22 1130 04/11/22 1226      Subjective: [name] today has no further fevers,  no further emesis,  No chest pain,   -- Fatigue and malaise persist -Appetite remains poor  Objective: Vitals:   04/12/22 1044 04/12/22 1146 04/12/22 1649 04/12/22 2022  BP:  136/66 130/62 (!) 142/70  Pulse:  91 (!) 102 (!) 104  Resp: (!) _0 Temp: 98.1 F (36.7 C) 97.9 F (36.6 C) 98.1 F (36.7 C) 98.5 F (36.9 C)  TempSrc: Oral Oral Oral   SpO2:  96% 98% 94%  Weight:      Height:        Intake/Output Summary (Last 24 hours) at 04/12/2022 2031 Last data filed at 04/12/2022 1604 Gross per 24 hour  Intake 1247.23 ml  Output 1950 ml  Net -702.77 ml   Filed Weights   04/11/22 1102  Weight: 91.3 kg    Physical Exam  Gen:- Awake Alert, looks more comfortable HEENT:- [place].AT, No sclera icterus Neck-Supple Neck,No JVD,.  Lungs-no wheezing, fair symmetrical air movement CV- S1, S2 normal, regular  Abd-  +ve B.Sounds, Abd Soft, No tenderness, ileal conduit contents are much less cloudy Extremity/Skin:- No  edema, pedal pulses present  Psych-affect is appropriate, oriented x3 Neuro-no new focal deficits, no tremors  Data Reviewed: I have personally reviewed following labs and imaging studies  CBC: Recent Labs  Lab  04/11/22 0535 04/11/22 1113 04/12/22 0507  WBC 15.6* 23.6* 11.8*  NEUTROABS 13.6* 21.2*  --   HGB 15.0 14.5 11.2*  HCT 44.5 43.3 34.5*  MCV 105.5* 105.4* 107.5*  PLT 185 200 747*   Basic Metabolic Panel: Recent Labs  Lab 04/11/22 0535 04/11/22 1113 04/12/22 0507  NA 140 138 140  K 3.9 3.7 3.6  CL 107 105 109  CO2 _1 GLUCOSE 124* 156* 100*  BUN _2 CREATININE 0.95 0.94 0.83  CALCIUM 8.6* 8.6* 8.0*   GFR: Estimated Creatinine Clearance: 80.7 mL/min (by C-G formula based on SCr of 0.83 mg/dL). Liver Function Tests: Recent Labs  Lab 04/11/22 0535 04/11/22 1113 04/12/22 0507  AST 16 20 11*  ALT _3 ALKPHOS 62 59 39  BILITOT 1.2 0.9 0.8  PROT 6.5 6.4* 4.8*  ALBUMIN 3.3* 3.2* 2.3*   Cardiac  Enzymes: No results for input(s): "CKTOTAL", "CKMB", "CKMBINDEX", "TROPONINI" in the last 168 hours. BNP (last 3 results) No results for input(s): "PROBNP" in the last 8760 hours. HbA1C: No results for input(s): "HGBA1C" in the last 72 hours. Sepsis Labs: _0 (procalcitonin:4,lacticidven:4) ) Recent Results (from the past 240 hour(s))  Resp Panel by RT-PCR (Flu A&B, Covid) Anterior Nasal Swab     Status: None   Collection Time: 04/11/22 11:13 AM   Specimen: Anterior Nasal Swab  Result Value Ref Range Status   SARS Coronavirus 2 by RT PCR NEGATIVE NEGATIVE Final    Comment: (NOTE) SARS-CoV-2 target nucleic acids are NOT DETECTED.  The SARS-CoV-2 RNA is generally detectable in upper respiratory specimens during the acute phase of infection. The lowest concentration of SARS-CoV-2 viral copies this assay can detect is 138 copies/mL. A negative result does not preclude SARS-Cov-2 infection and should not be used as the sole basis for treatment or other patient management decisions. A negative result may occur with  improper specimen collection/handling, submission of specimen other than nasopharyngeal swab, presence of viral mutation(s) within  the areas targeted by this assay, and inadequate number of viral copies(<138 copies/mL). A negative result must be combined with clinical observations, patient history, and epidemiological information. The expected result is Negative.  Fact Sheet for Patients:  EntrepreneurPulse.com.au  Fact Sheet for Healthcare Providers:  IncredibleEmployment.be  This test is no t yet approved or cleared by the [place] FDA and  has been authorized for detection and/or diagnosis of SARS-CoV-2 by FDA under an Emergency Use Authorization (EUA). This EUA will remain  in effect (meaning this test can be used) for the duration of the COVID-19 declaration under Section 564(b)(1) of the Act, 21 U.S.C.section 360bbb-3(b)(1), unless the authorization is terminated  or revoked sooner.       Influenza A by PCR NEGATIVE NEGATIVE Final   Influenza B by PCR NEGATIVE NEGATIVE Final    Comment: (NOTE) The Xpert Xpress SARS-CoV-2/FLU/RSV plus assay is intended as an aid in the diagnosis of influenza from Nasopharyngeal swab specimens and should not be used as a sole basis for treatment. Nasal washings and aspirates are unacceptable for Xpert Xpress SARS-CoV-2/FLU/RSV testing.  Fact Sheet for Patients: EntrepreneurPulse.com.au  Fact Sheet for Healthcare Providers: IncredibleEmployment.be  This test is not yet approved or cleared by the [place] FDA and has been authorized for detection and/or diagnosis of SARS-CoV-2 by FDA under an Emergency Use Authorization (EUA). This EUA will remain in effect (meaning this test can be used) for the duration of the COVID-19 declaration under Section 564(b)(1) of the Act, 21 U.S.C. section 360bbb-3(b)(1), unless the authorization is terminated or revoked.  Performed at [facility], [address] [place], [place] [address]   Culture, blood (routine x 2)     Status: None (Preliminary  result)   Collection Time: 04/11/22 11:38 AM   Specimen: BLOOD  Result Value Ref Range Status   Specimen Description BLOOD LEFT ANTECUBITAL  Final   Special Requests   Final    Blood Culture adequate volume BOTTLES DRAWN AEROBIC AND ANAEROBIC   Culture   Final    NO GROWTH < 12 HOURS Performed at [facility], [address], [place], [place] [address]    Report Status PENDING  Incomplete  Culture, blood (routine x 2)     Status: None (Preliminary result)   Collection Time: 04/11/22 11:46 AM   Specimen: BLOOD  Result Value Ref Range Status   Specimen Description BLOOD BLOOD RIGHT  HAND  Final   Special Requests   Final    BOTTLES DRAWN AEROBIC AND ANAEROBIC Blood Culture adequate volume   Culture   Final    NO GROWTH < 12 HOURS Performed at [facility], [address], [place], [place] [address]    Report Status PENDING  Incomplete  Urine Culture     Status: Abnormal (Preliminary result)   Collection Time: 04/11/22 11:59 AM   Specimen: Urine, Clean Catch  Result Value Ref Range Status   Specimen Description   Final    URINE, CLEAN CATCH Performed at [facility], [address], [place], [place] [address]    Special Requests   Final    NONE Performed at [facility], [address], [place], [place] [address]    Culture (A)  Final    20,000 COLONIES/mL GRAM NEGATIVE RODS IDENTIFICATION AND SUSCEPTIBILITIES TO FOLLOW Performed at [facility], [place] [address] [place], [place] [address]    Report Status PENDING  Incomplete      Radiology Studies: DG Chest 2 View  Result Date: 04/12/2022 CLINICAL DATA:  Dyspnea EXAM: CHEST - 2 VIEW COMPARISON:  Yesterday FINDINGS: Artifact from EKG leads. Chronic right apical opacity which is indistinct and partially obscured. There is no edema, consolidation, or pneumothorax. Hazy density at the bases from pleural fluid by recent abdominal CT. Normal heart size and stable mediastinal contours. IMPRESSION: Small pleural effusions.  No  interval abnormality. Electronically Signed   By: Jorje Guild M.D.   On: 04/12/2022 06:14   DG Chest Port 1 View  Result Date: 04/11/2022 CLINICAL DATA:  Weakness and vomiting. High fever. Questionable sepsis. Evaluate for abnormality. History of metastatic urothelial carcinoma. EXAM: PORTABLE CHEST 1 VIEW COMPARISON:  Chest radiograph 12/07/2021 and PET-CT 03/15/2022 and CT abdomen 04/11/2022 FINDINGS: Haziness at the lung bases with blunting of the costophrenic angles. Findings compatible with known bilateral pleural effusions from recent abdominal CT. Chronic parenchymal densities at the right lung apex. Heart and mediastinum are within normal limits and stable. Negative for a pneumothorax. No acute bone abnormality. IMPRESSION: Haziness and blunting at the lung bases. Findings compatible with known bilateral pleural effusions. Chronic changes at the right lung apex. Electronically Signed   By: Markus Daft M.D.   On: 04/11/2022 11:36   CT ABDOMEN PELVIS W CONTRAST  Result Date: 04/11/2022 CLINICAL DATA:  81 year old male with history of acute onset of nonlocalized abdominal pain. Foul-smelling urine. Possible pyelonephritis. History of bladder cancer status post radical cystectomy and ileal conduit formation. * Tracking Code: BO * EXAM: CT ABDOMEN AND PELVIS WITH CONTRAST TECHNIQUE: Multidetector CT imaging of the abdomen and pelvis was performed using the standard protocol following bolus administration of intravenous contrast. RADIATION DOSE REDUCTION: This exam was performed according to the departmental dose-optimization program which includes automated exposure control, adjustment of the mA and/or kV according to patient size and/or use of iterative reconstruction technique. CONTRAST:  132m OMNIPAQUE IOHEXOL 300 MG/ML  SOLN COMPARISON:  CT of the abdomen and pelvis 12/25/2021. PET-CT 03/15/2022. FINDINGS: Lower chest: Calcifications of the aortic valve. Atherosclerotic calcifications in the  descending thoracic aorta as well as the distal right coronary artery. Moderate bilateral pleural effusions lying dependently. Small hiatal hernia. Hepatobiliary: Multiple low-attenuation hepatic lesions, largest of which are compatible with simple cysts, measuring up to 2.7 x 2.2 cm in segment 6 of the liver (axial image 37 of series 2). Other subcentimeter low-attenuation hepatic lesions are too small to definitively characterize, but statistically  likely to represent tiny cysts or biliary hamartomas (no imaging follow-up is recommended). The appearance of the gallbladder is normal. Pancreas: No pancreatic mass. No pancreatic ductal dilatation. No pancreatic or peripancreatic fluid collections or inflammatory changes. Spleen: Unremarkable. Adrenals/Urinary Tract: Status radical cystoprostatectomy with right lower quadrant ileostomy. Left-sided hydroureteronephrosis which extends all the way to the level of the ileal conduit. Urothelial hyperenhancement throughout the left ureter, and within the left renal collecting system. Enhancement pattern of the left kidney is grossly unremarkable. Specifically, no overt hypovascular areas to clearly indicate acute pyelonephritis at this time. Subcentimeter low-attenuation lesion in the upper pole of the right kidney, too small to characterize, but statistically likely tiny cysts (no imaging follow-up is recommended). No right hydroureteronephrosis. Bilateral adrenal glands are normal in appearance. Stomach/Bowel: Intra-abdominal portion of the stomach is unremarkable. No pathologic dilatation of small bowel or colon. The appendix is not confidently identified and may be surgically absent. Regardless, there are no inflammatory changes noted adjacent to the cecum to suggest the presence of an acute appendicitis at this time. Vascular/Lymphatic: Atherosclerotic calcifications throughout the abdominal and pelvic vasculature, without definite aneurysm or dissection. No  lymphadenopathy noted in the abdomen or pelvis. Reproductive: Status post radical cystoprostatectomy. Other: No significant volume of ascites.  No pneumoperitoneum. Musculoskeletal: Expansile lytic lesion in the right inferior pubic ramus extending into the overlying soft tissues estimated to measure approximately 4.5 x 5.3 cm (axial image 92 of series 2), similar to prior studies. No other new suspicious appearing lytic or blastic lesions are noted elsewhere in the visualized portions of the skeleton. IMPRESSION: 1. Mild left hydroureteronephrosis with extensive urothelial hyperenhancement involving the left ureter and to a lesser extent the left renal pelvis. Correlation with urinalysis is recommended. No overt imaging changes to clearly indicate pyelonephritis at this time. 2. Moderate bilateral pleural effusions lying dependently. 3. Expansile lytic lesion in the right inferior pubic ramus, similar to prior studies, likely to represent an osseous metastasis. 4. Aortic atherosclerosis. 5. Additional postoperative changes and incidental findings, as above. Electronically Signed   By: Vinnie Langton M.D.   On: 04/11/2022 07:48     Scheduled Meds:  heparin  5,000 Units Subcutaneous Q8H   lactose free nutrition  237 mL Oral TID BM   levothyroxine  50 mcg Oral Q0600   sodium chloride flush  3 mL Intravenous Q12H   sodium chloride flush  3 mL Intravenous Q12H   Continuous Infusions:  sodium chloride     sodium chloride 75 mL/hr at 04/12/22 0908   ertapenem 1,000 mg (04/12/22 1331)     LOS: 1 day    Roxan Hockey M.D on 04/12/2022 at 8:31 PM  Go to [email or web address] - for contact info  [facility] - Office  [phone]  If 7PM-7AM, please contact night-coverage [email or web address] 04/12/2022, 8:31 PM

## 2022-04-12 NOTE — TOC Initial Note (Signed)
Transition of Care [name]) - Initial/Assessment Note    Patient Details  Name: [name] MRN: [id number] Date of Birth: 06-22-1941  Transition of [facility]) CM/SW Contact:    Salome Arnt, [place] Phone Number: 04/12/2022, 11:22 AM  Clinical Narrative: Pt admitted with sepsis. Assessment completed due to high risk readmission score. Pt's wife completed assessment with TOC as pt was being moved to third floor. Pt's wife indicates pt is fairly independent at baseline. He drives himself to appointments. Pt's wife plans on him returning home when medically stable. TOC will continue to follow.                  Expected Discharge Plan: Home/Self Care Barriers to Discharge: Continued Medical Work up   Patient Goals and CMS Choice Patient states their goals for this hospitalization and ongoing recovery are:: return home   Choice offered to / list presented to : Spouse  Expected Discharge Plan and Services Expected Discharge Plan: Home/Self Care In-house Referral: Clinical Social Work     Living arrangements for the past 2 months: Single Family Home                                      Prior Living Arrangements/Services Living arrangements for the past 2 months: Single Family Home Lives with:: Spouse Patient language and need for interpreter reviewed:: Yes Do you feel safe going back to the place where you live?: Yes            Criminal Activity/Legal Involvement Pertinent to Current Situation/Hospitalization: No - Comment as needed  Activities of Daily Living      Permission Sought/Granted                  Emotional Assessment         Alcohol / Substance Use: Not Applicable Psych Involvement: No (comment)  Admission diagnosis:  Sepsis ([place]) [A41.9] Patient Active Problem List   Diagnosis Date Noted   C. 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 ([place]) 10/30/2021   Sepsis  secondary to urinary source 07/13/2020   Pseudomonas aeruginosa infection 06/18/2020   GERD (gastroesophageal reflux disease)    Sinus tachycardia    Fever    Dehydration    Rigors    Severe sepsis ([facility])    Elevated lactic acid level    Cancer with pulmonary metastases ([place]) 03/31/2020   Adverse drug reaction 06/05/2018   Bacteremia associated with intravascular line ([place]) 04/20/2018   Febrile neutropenia ([place]) 04/13/2018   Gram positive sepsis ([place]) 04/13/2018   SIRS (systemic inflammatory response syndrome) ([place]) 04/12/2018   Bacteremia due to Gram-positive bacteria 04/12/2018   Pancytopenia ([place]) 04/12/2018   Status post chemotherapy    UTI (urinary tract infection) 04/11/2018   Port-A-Cath in place 02/20/2018   Encounter for antineoplastic chemotherapy 02/17/2018   Sepsis due to urinary tract infection ([place]) 01/25/2018   Sepsis secondary to UTI ([place]) 01/25/2018   Hyperglycemia    Goals of care, counseling/discussion 01/18/2018   Bone metastasis 12/29/2017   Status post ileal conduit due to bladder cancer 06/29/2017   Bladder cancer ([place]) 06/28/2017   Stage IV malignant neoplasm of urinary bladder /with mets to the bones and lungs 04/20/2017   COPD (chronic obstructive pulmonary disease) ([place]) 03/07/2008   PCP:  [name], MD Pharmacy:   [facility] [place],  [place] - [address]   [address] Phone: [phone] Fax: [phone]     Social Determinants of Health (SDOH) Interventions    Readmission Risk Interventions     No data to display

## 2022-04-13 DIAGNOSIS — A419 Sepsis, unspecified organism: Secondary | ICD-10-CM | POA: Diagnosis not present

## 2022-04-13 DIAGNOSIS — J449 Chronic obstructive pulmonary disease, unspecified: Secondary | ICD-10-CM | POA: Diagnosis not present

## 2022-04-13 DIAGNOSIS — C679 Malignant neoplasm of bladder, unspecified: Secondary | ICD-10-CM | POA: Diagnosis not present

## 2022-04-13 DIAGNOSIS — Z936 Other artificial openings of urinary tract status: Secondary | ICD-10-CM | POA: Diagnosis not present

## 2022-04-13 LAB — URINE CULTURE: Culture: 20000 — AB

## 2022-04-13 LAB — CBC
HCT: 34.5 % — ABNORMAL LOW (ref 39.0–52.0)
Hemoglobin: 11.2 g/dL — ABNORMAL LOW (ref 13.0–17.0)
MCH: 34.9 pg — ABNORMAL HIGH (ref 26.0–34.0)
MCHC: 32.5 g/dL (ref 30.0–36.0)
MCV: 107.5 fL — ABNORMAL HIGH (ref 80.0–100.0)
Platelets: 126 10*3/uL — ABNORMAL LOW (ref 150–400)
RBC: 3.21 MIL/uL — ABNORMAL LOW (ref 4.22–5.81)
RDW: 13.1 % (ref 11.5–15.5)
WBC: 11.8 10*3/uL — ABNORMAL HIGH (ref 4.0–10.5)
nRBC: 0 % (ref 0.0–0.2)

## 2022-04-13 MED ORDER — SODIUM CHLORIDE 0.9 % IV SOLN: 1.0000 g | Freq: Three times a day (TID) | INTRAVENOUS | Status: DC

## 2022-04-13 MED ORDER — SODIUM CHLORIDE 0.9 % IV SOLN
1.0000 g | Freq: Three times a day (TID) | INTRAVENOUS | Status: AC
  Administered 2022-04-13 – 2022-04-17 (×14): 1 g via INTRAVENOUS
  Filled 2022-04-13 (×9): qty 10
  Filled 2022-04-13: qty 1
  Filled 2022-04-13 (×4): qty 10

## 2022-04-13 NOTE — Progress Notes (Signed)
   04/13/22 1406  Assess: MEWS Score  Temp 97.8 F (36.6 C)  BP 134/84  MAP (mmHg) 97  Pulse Rate 94  Resp (!) 26  SpO2 97 %  O2 Device Room Air  Assess: MEWS Score  MEWS Temp 0  MEWS Systolic 0  MEWS Pulse 0  MEWS RR 2  MEWS LOC 0  MEWS Score 2  MEWS Score Color Yellow  Assess: if the MEWS score is Yellow or Red  Were vital signs taken at a resting state? No  Focused Assessment No change from prior assessment  Does the patient meet 2 or more of the SIRS criteria? Yes  Does the patient have a confirmed or suspected source of infection? No  Provider and Rapid Response Notified? No  MEWS guidelines implemented *See Row Information* No, previously yellow, continue vital signs every 4 hours  Treat  Pain Scale 0-10  Pain Score 10  Pain Type Chronic pain  Pain Location Buttocks  Take Vital Signs  Increase Vital Sign Frequency  Yellow: Q 2hr X 2 then Q 4hr X 2, if remains yellow, continue Q 4hrs  Escalate  MEWS: Escalate Yellow: discuss with charge nurse/RN and consider discussing with provider and RRT  Notify: Charge Nurse/RN  Name of Charge Nurse/RN Notified Emiliano Dyer RN  Date Charge Nurse/RN Notified 04/13/22  Time Charge Nurse/RN Notified 1407  Assess: SIRS CRITERIA  SIRS Temperature  0  SIRS Pulse 1  SIRS Respirations  1  SIRS WBC 1  SIRS Score Sum  3

## 2022-04-13 NOTE — Progress Notes (Signed)
PROGRESS NOTE     [name], is a 81 y.o. male, DOB - [birth year], OEU:[id number]  Admit date - 04/11/2022   Admitting Physician Zyrell Carmean Denton Brick, MD  Outpatient Primary MD for the patient is [name], MD  LOS - 2  Chief Complaint  Patient presents with   Weakness        Brief Narrative:   81 y.o. male with past medical history relevant for h/o bladder cancer status post cystectomy/ileal conduit, recurrent urinary tract infections with MDR, hypothyroidism, BPH and GERD admitted on 04/11/2022 with sepsis secondary to urinary source    -Assessment and Plan: 1) Pseudomonas sepsis secondary to presumed urinary source--POA -patient met sepsis criteria on admission with with persistent fevers (up to 103.3), Tachycardia, tachypnea and leukocytosis (-WBC is up to 23.6 from 15.6 within a 6-hour interval) -Patient with history of recurrent UTIs from MDR organisms in the setting of history of bladder cancer status post prior cystectomy with ileal conduit --Lactic acid is down to 2.3 from 3.1 after IV fluids -Urine culture from 04/11/2022 with Pseudomonas aeruginosa -Blood cultures NGTD -Urine culture from 03/17/2022 with MDR Klebsiella pneumoniae -Urine culture from 03/03/2022 with MD Pseudomonas -Initially treated with IV IV ertapenem , okay to de-escalate to cefepime -Received IV fluids per sepsis protocol WBC 15.6 >>23.6>>11.8  Pseudomonas sepsis due to Urinary Source--h/o bladder ca -Needs IV cefepime for 3 to 5 days at least ----?? rash in the past when he got the cefepime so please watch closely for any potential side effects -No good oral antibiotic agents so he needs to stay in the hospital  2) history of stage IV bladder cancer with metastatic to the lung and bones -Patient's stage IV high-grade urothelial carcinoma was originally diagnosed 2018 with pulmonary and bone involvement. T3bN0 disease. This tumor found to have PDL 1 positive  with CPS score over 90%.  -Patient  follows with Dr. Sol Passer -Patient previously had  laparoscopic Cystoprostatectomy and bilateral lymphadenectomy done by Dr. Tresa Moore on 06/29/2017 -He completed radiation therapy to the pelvis on January 16, 2018 under the care of Dr. Tammi Klippel. He is status post radiation therapy to the right paratracheal lymph node for a total of 50 Gray in 10 fractions completed on April 23, 2020.  He is status post definitive radiation to intrathoracic lymph node completed on November 27, 2021.  He received 40 Gray in 10 fractions. 04/11/22---CT abdomen and pelvis shows mild left hydro ureteral nephrosis with extensive urothelial hyperenhancement involving the left ureter until this stent in the left renal pelvis, moderate bilateral pleural effusions, possible lytic lesion in the right inferior pubic rami-similar to prior studies suggestive of osseous metastases   3)Hypothyroidism--- TSH WNL at 1.34 -Continue levothyroxine   4)COPD--- patient presented with some wheezing -Wheezing resolved with bronchodilators -Hold off on steroids at this time -Chest x-ray without pneumonia -No hypoxia, continue bronchodilators  5) acute anemia--suspect related to hemodilution in setting of IV fluids per sepsis protocol ---no evidence of ongoing bleeding -Repeat CBC in a.m.    Disposition/Need for in-Hospital Stay- patient unable to be discharged at this time due to --Pseudomonas sepsis due to Urinary Source--h/o bladder ca -Needs IV cefepime for 3 to 5 days at least ----?? rash in the past when you got the cefepime so please watch closely for any potential side effects -No good oral antibiotic agents so he needs to stay in the hospital  Status is: Inpatient   Remains inpatient appropriate because:    Dispo: The patient is from:  Home              Anticipated d/c is to: Home              Anticipated d/c date is: 3 days              Patient currently is not medically stable to d/c. Barriers: Not Clinically  Stable-    Code Status :  -  Code Status: Full Code   Family Communication:  -Discussed with his wife at bedside  DVT Prophylaxis  :   - SCDs   heparin injection 5,000 Units Start: 04/11/22 1645 SCDs Start: 04/11/22 1637 Place TED hose Start: 04/11/22 1637   Lab Results  Component Value Date   PLT 126 (L) 04/12/2022    Inpatient Medications  Scheduled Meds:  heparin  5,000 Units Subcutaneous Q8H   lactose free nutrition  237 mL Oral TID BM   levothyroxine  50 mcg Oral Q0600   sodium chloride flush  3 mL Intravenous Q12H   sodium chloride flush  3 mL Intravenous Q12H   Continuous Infusions:  sodium chloride     ceFEPime (MAXIPIME) IV 1 g (04/13/22 1117)   PRN Meds:.sodium chloride, acetaminophen, acetaminophen, bisacodyl, ipratropium-albuterol, oxyCODONE, polyethylene glycol, prochlorperazine, sodium chloride flush, traZODone   Anti-infectives (From admission, onward)    Start     Dose/Rate Route Frequency Ordered Stop   04/13/22 1000  ceFEPIme (MAXIPIME) 1 g in sodium chloride 0.9 % 100 mL IVPB        1 g 200 mL/hr over 30 Minutes Intravenous Every 8 hours 04/13/22 0904     04/13/22 0945  meropenem (MERREM) 1 g in sodium chloride 0.9 % 100 mL IVPB  Status:  Discontinued        1 g 200 mL/hr over 30 Minutes Intravenous Every 8 hours 04/13/22 0854 04/13/22 0904   04/11/22 1315  ertapenem (INVANZ) 1,000 mg in sodium chloride 0.9 % 100 mL IVPB  Status:  Discontinued        1 g 200 mL/hr over 30 Minutes Intravenous Every 24 hours 04/11/22 1303 04/13/22 0854   04/11/22 1145  piperacillin-tazobactam (ZOSYN) IVPB 3.375 g        3.375 g 100 mL/hr over 30 Minutes Intravenous  Once 04/11/22 1130 04/11/22 1226      Subjective: [name] today has no further fevers, -Ambulating without hypoxia or dyspnea on exertion -Complains of fatigue and generalized weakness --wife at bedside, questions answered No further emesis   Objective: Vitals:   04/13/22 0455 04/13/22 1406  04/13/22 1432 04/13/22 1639  BP: 136/82 134/84  (!) 141/69  Pulse: (!) 102 94  86  Resp:  (!) 26  (!) 24  Temp: 98.3 F (36.8 C) 97.8 F (36.6 C)  98 F (36.7 C)  TempSrc: Oral Oral  Oral  SpO2: 93% 97% 97% 97%  Weight:      Height:        Intake/Output Summary (Last 24 hours) at 04/13/2022 1813 Last data filed at 04/13/2022 1300 Gross per 24 hour  Intake 1209.86 ml  Output 350 ml  Net 859.86 ml   Filed Weights   04/11/22 1102  Weight: 91.3 kg    Physical Exam  Gen:- Awake Alert, no acute distress  HEENT:- [place].AT, No sclera icterus Neck-Supple Neck,No JVD,.  Lungs-no wheezing, fair symmetrical air movement CV- S1, S2 normal, regular  Abd-  +ve B.Sounds, Abd Soft, No tenderness, ileal conduit contents are much less cloudy Extremity/Skin:- No  edema, pedal pulses present  Psych-affect is appropriate, oriented x3 Neuro-no new focal deficits, no tremors  Data Reviewed: I have personally reviewed following labs and imaging studies  CBC: Recent Labs  Lab 04/11/22 0535 04/11/22 1113 04/12/22 0507  WBC 15.6* 23.6* 11.8*  NEUTROABS 13.6* 21.2*  --   HGB 15.0 14.5 11.2*  HCT 44.5 43.3 34.5*  MCV 105.5* 105.4* 107.5*  PLT 185 200 010*   Basic Metabolic Panel: Recent Labs  Lab 04/11/22 0535 04/11/22 1113 04/12/22 0507  NA 140 138 140  K 3.9 3.7 3.6  CL 107 105 109  CO2 _0 GLUCOSE 124* 156* 100*  BUN _1 CREATININE 0.95 0.94 0.83  CALCIUM 8.6* 8.6* 8.0*   GFR: Estimated Creatinine Clearance: 80.7 mL/min (by C-G formula based on SCr of 0.83 mg/dL). Liver Function Tests: Recent Labs  Lab 04/11/22 0535 04/11/22 1113 04/12/22 0507  AST 16 20 11*  ALT _2 ALKPHOS 62 59 39  BILITOT 1.2 0.9 0.8  PROT 6.5 6.4* 4.8*  ALBUMIN 3.3* 3.2* 2.3*   Cardiac Enzymes: No results for input(s): "CKTOTAL", "CKMB", "CKMBINDEX", "TROPONINI" in the last 168 hours. BNP (last 3 results) No results for input(s): "PROBNP" in the last 8760 hours. HbA1C: No  results for input(s): "HGBA1C" in the last 72 hours. Sepsis Labs: _3 (procalcitonin:4,lacticidven:4) ) Recent Results (from the past 240 hour(s))  Resp Panel by RT-PCR (Flu A&B, Covid) Anterior Nasal Swab     Status: None   Collection Time: 04/11/22 11:13 AM   Specimen: Anterior Nasal Swab  Result Value Ref Range Status   SARS Coronavirus 2 by RT PCR NEGATIVE NEGATIVE Final    Comment: (NOTE) SARS-CoV-2 target nucleic acids are NOT DETECTED.  The SARS-CoV-2 RNA is generally detectable in upper respiratory specimens during the acute phase of infection. The lowest concentration of SARS-CoV-2 viral copies this assay can detect is 138 copies/mL. A negative result does not preclude SARS-Cov-2 infection and should not be used as the sole basis for treatment or other patient management decisions. A negative result may occur with  improper specimen collection/handling, submission of specimen other than nasopharyngeal swab, presence of viral mutation(s) within the areas targeted by this assay, and inadequate number of viral copies(<138 copies/mL). A negative result must be combined with clinical observations, patient history, and epidemiological information. The expected result is Negative.  Fact Sheet for Patients:  EntrepreneurPulse.com.au  Fact Sheet for Healthcare Providers:  IncredibleEmployment.be  This test is no t yet approved or cleared by the [place] FDA and  has been authorized for detection and/or diagnosis of SARS-CoV-2 by FDA under an Emergency Use Authorization (EUA). This EUA will remain  in effect (meaning this test can be used) for the duration of the COVID-19 declaration under Section 564(b)(1) of the Act, 21 U.S.C.section 360bbb-3(b)(1), unless the authorization is terminated  or revoked sooner.       Influenza A by PCR NEGATIVE NEGATIVE Final   Influenza B by PCR NEGATIVE NEGATIVE Final    Comment: (NOTE) The  Xpert Xpress SARS-CoV-2/FLU/RSV plus assay is intended as an aid in the diagnosis of influenza from Nasopharyngeal swab specimens and should not be used as a sole basis for treatment. Nasal washings and aspirates are unacceptable for Xpert Xpress SARS-CoV-2/FLU/RSV testing.  Fact Sheet for Patients: EntrepreneurPulse.com.au  Fact Sheet for Healthcare Providers: IncredibleEmployment.be  This test is not yet approved or cleared by the [place] FDA and has been authorized for detection and/or  diagnosis of SARS-CoV-2 by FDA under an Emergency Use Authorization (EUA). This EUA will remain in effect (meaning this test can be used) for the duration of the COVID-19 declaration under Section 564(b)(1) of the Act, 21 U.S.C. section 360bbb-3(b)(1), unless the authorization is terminated or revoked.  Performed at [facility], [address], [place], [place] [address]   Culture, blood (routine x 2)     Status: None (Preliminary result)   Collection Time: 04/11/22 11:38 AM   Specimen: BLOOD  Result Value Ref Range Status   Specimen Description BLOOD LEFT ANTECUBITAL  Final   Special Requests   Final    Blood Culture adequate volume BOTTLES DRAWN AEROBIC AND ANAEROBIC   Culture   Final    NO GROWTH 2 DAYS Performed at [facility], [address], [place], [place] [address]    Report Status PENDING  Incomplete  Culture, blood (routine x 2)     Status: None (Preliminary result)   Collection Time: 04/11/22 11:46 AM   Specimen: BLOOD  Result Value Ref Range Status   Specimen Description BLOOD BLOOD RIGHT HAND  Final   Special Requests   Final    BOTTLES DRAWN AEROBIC AND ANAEROBIC Blood Culture adequate volume   Culture   Final    NO GROWTH 2 DAYS Performed at [facility], [address] [place], [place] [address]    Report Status PENDING  Incomplete  Urine Culture     Status: Abnormal   Collection Time: 04/11/22 11:59 AM   Specimen: Urine, Clean  Catch  Result Value Ref Range Status   Specimen Description   Final    URINE, CLEAN CATCH Performed at [facility], [address] [place], [place] [address]    Special Requests   Final    NONE Performed at [facility], [address] [place], [place] [address]    Culture 20,000 COLONIES/mL PSEUDOMONAS AERUGINOSA (A)  Final   Report Status 04/13/2022 FINAL  Final   Organism ID, Bacteria PSEUDOMONAS AERUGINOSA (A)  Final      Susceptibility   Pseudomonas aeruginosa - MIC*    CEFTAZIDIME 8 SENSITIVE Sensitive     CIPROFLOXACIN 1 INTERMEDIATE Intermediate     GENTAMICIN <=1 SENSITIVE Sensitive     IMIPENEM 2 SENSITIVE Sensitive     PIP/TAZO 16 SENSITIVE Sensitive     CEFEPIME 4 SENSITIVE Sensitive     * 20,000 COLONIES/mL PSEUDOMONAS AERUGINOSA      Radiology Studies: DG Chest 2 View  Result Date: 04/12/2022 CLINICAL DATA:  Dyspnea EXAM: CHEST - 2 VIEW COMPARISON:  Yesterday FINDINGS: Artifact from EKG leads. Chronic right apical opacity which is indistinct and partially obscured. There is no edema, consolidation, or pneumothorax. Hazy density at the bases from pleural fluid by recent abdominal CT. Normal heart size and stable mediastinal contours. IMPRESSION: Small pleural effusions.  No interval abnormality. Electronically Signed   By: Jorje Guild M.D.   On: 04/12/2022 06:14     Scheduled Meds:  heparin  5,000 Units Subcutaneous Q8H   lactose free nutrition  237 mL Oral TID BM   levothyroxine  50 mcg Oral Q0600   sodium chloride flush  3 mL Intravenous Q12H   sodium chloride flush  3 mL Intravenous Q12H   Continuous Infusions:  sodium chloride     ceFEPime (MAXIPIME) IV 1 g (04/13/22 1117)     LOS: 2 days    Roxan Hockey M.D on 04/13/2022 at 6:13 PM  Go to [email or web address] - for  contact info  [facility] - Office  [phone]  If 7PM-7AM, please contact night-coverage [email or web address] 04/13/2022, 6:13 PM

## 2022-04-13 NOTE — Plan of Care (Signed)
  Problem: Education: Goal: Knowledge of General Education information will improve Description: Including pain rating scale, medication(s)/side effects and non-pharmacologic comfort measures Outcome: Progressing   Problem: Clinical Measurements: Goal: Ability to maintain clinical measurements within normal limits will improve Outcome: Progressing   

## 2022-04-14 ENCOUNTER — Encounter (HOSPITAL_COMMUNITY): Payer: Self-pay | Admitting: Family Medicine

## 2022-04-14 ENCOUNTER — Ambulatory Visit: Payer: Medicare Other

## 2022-04-14 DIAGNOSIS — J449 Chronic obstructive pulmonary disease, unspecified: Secondary | ICD-10-CM | POA: Diagnosis not present

## 2022-04-14 DIAGNOSIS — I480 Paroxysmal atrial fibrillation: Secondary | ICD-10-CM | POA: Clinically undetermined

## 2022-04-14 DIAGNOSIS — A419 Sepsis, unspecified organism: Secondary | ICD-10-CM | POA: Diagnosis not present

## 2022-04-14 LAB — BASIC METABOLIC PANEL
Anion gap: 5 (ref 5–15)
BUN: 9 mg/dL (ref 8–23)
CO2: 26 mmol/L (ref 22–32)
Calcium: 8.2 mg/dL — ABNORMAL LOW (ref 8.9–10.3)
Chloride: 110 mmol/L (ref 98–111)
Creatinine, Ser: 0.93 mg/dL (ref 0.61–1.24)
GFR, Estimated: >60 mL/min (ref >60)
Glucose, Bld: 88 mg/dL (ref 70–99)
Potassium: 3.1 mmol/L — ABNORMAL LOW (ref 3.5–5.1)
Sodium: 141 mmol/L (ref 135–145)

## 2022-04-14 LAB — CBC
HCT: 35.6 % — ABNORMAL LOW (ref 39.0–52.0)
Hemoglobin: 11.6 g/dL — ABNORMAL LOW (ref 13.0–17.0)
MCH: 35.2 pg — ABNORMAL HIGH (ref 26.0–34.0)
MCHC: 32.6 g/dL (ref 30.0–36.0)
MCV: 107.9 fL — ABNORMAL HIGH (ref 80.0–100.0)
Platelets: 142 10*3/uL — ABNORMAL LOW (ref 150–400)
RBC: 3.3 MIL/uL — ABNORMAL LOW (ref 4.22–5.81)
RDW: 13 % (ref 11.5–15.5)
WBC: 6.3 10*3/uL (ref 4.0–10.5)
nRBC: 0 % (ref 0.0–0.2)

## 2022-04-14 LAB — MAGNESIUM: Magnesium: 2.2 mg/dL (ref 1.7–2.4)

## 2022-04-14 LAB — GLUCOSE, CAPILLARY: Glucose-Capillary: 95 mg/dL (ref 70–99)

## 2022-04-14 MED ORDER — POTASSIUM CHLORIDE CRYS ER 20 MEQ PO TBCR
40.0000 meq | EXTENDED_RELEASE_TABLET | Freq: Once | ORAL | Status: AC
  Administered 2022-04-14: 40 meq via ORAL
  Filled 2022-04-14: qty 2

## 2022-04-14 MED ORDER — APIXABAN 5 MG PO TABS
5.0000 mg | ORAL_TABLET | Freq: Two times a day (BID) | ORAL | Status: DC
  Administered 2022-04-15 – 2022-04-17 (×5): 5 mg via ORAL
  Filled 2022-04-14 (×5): qty 1

## 2022-04-14 MED ORDER — IPRATROPIUM-ALBUTEROL 0.5-2.5 (3) MG/3ML IN SOLN
3.0000 mL | Freq: Three times a day (TID) | RESPIRATORY_TRACT | Status: DC
Start: 2022-04-14 — End: 2022-04-17
  Administered 2022-04-14 – 2022-04-16 (×7): 3 mL via RESPIRATORY_TRACT
  Filled 2022-04-14 (×7): qty 3

## 2022-04-14 MED ORDER — METOPROLOL TARTRATE 25 MG PO TABS
25.0000 mg | ORAL_TABLET | Freq: Two times a day (BID) | ORAL | Status: DC
  Administered 2022-04-14 – 2022-04-17 (×7): 25 mg via ORAL
  Filled 2022-04-14 (×7): qty 1

## 2022-04-14 NOTE — Progress Notes (Addendum)
PROGRESS NOTE   [name]  TGY:[id number] DOB: [birth year] DOA: 04/11/2022 PCP: [name], MD   Chief Complaint  Patient presents with   Weakness   Level of care: Telemetry  Brief Admission History:  81 y.o. male with past medical history relevant for h/o bladder cancer status post cystectomy/ileal conduit, recurrent urinary tract infections with MDR, hypothyroidism, BPH and GERD who presents to the ED with fevers nausea, vomiting and malaise -Patient was initially seen in the ED on 04/10/2022 treated and released for presumed UTI -Return to the ED on 04/11/2022 with worsening symptoms and persistent fevers, as well as flank pain/lower back pain and foul-smelling urine -CT abdomen and pelvis shows mild left hydro ureteral nephrosis with extensive urothelial hyperenhancement involving the left ureter until this stent in the left renal pelvis, moderate bilateral pleural effusions, possible lytic lesion in the right inferior pubic rami-similar to prior studies suggestive of osseous metastases --Chest x-ray  with chronic right apical changes and bilateral nares pleural effusions, no definite pneumonia -Lactic acid is down to 2.3 from 3.1 after IV fluids -UA suggestive for UTI urine culture pending -WBC is up to 23.6 from 15.6 within a 6-hour interval -Creatinine 0.94, LFTs are not elevated -Patient with recent history of C. difficile infection in May 2023   Assessment and Plan:  Pseudomonas sepsis secondary to presumed urinary source--POA -patient met sepsis criteria on admission with with persistent fevers (up to 103.3), Tachycardia, tachypnea and leukocytosis (-WBC is up to 23.6 from 15.6 within a 6-hour interval) -Patient with history of recurrent UTIs from MDR organisms in the setting of history of bladder cancer status post prior cystectomy with ileal conduit --Lactic acid is down to 2.3 from 3.1 after IV fluids -Urine culture from 04/11/2022 with Pseudomonas aeruginosa -Blood  cultures NGTD -Urine culture from 03/17/2022 with MDR Klebsiella pneumoniae -Urine culture from 03/03/2022 with MD Pseudomonas -Initially treated with IV IV ertapenem , okay to de-escalate to cefepime -Received IV fluids per sepsis protocol WBC 15.6 >>23.6>>11.8  Pseudomonas sepsis due to Urinary Source--h/o bladder ca -Continue IV cefepime   2) History of stage IV bladder cancer with metastatic to the lung and bones -Patient's stage IV high-grade urothelial carcinoma was originally diagnosed 2018 with pulmonary and bone involvement. T3bN0 disease. This tumor found to have PDL 1 positive  with CPS score over 90%.  -Patient follows with Dr. Sol Passer -Patient previously had  laparoscopic Cystoprostatectomy and bilateral lymphadenectomy done by Dr. Tresa Moore on 06/29/2017 -He completed radiation therapy to the pelvis on January 16, 2018 under the care of Dr. Tammi Klippel. He is status post radiation therapy to the right paratracheal lymph node for a total of 50 Gray in 10 fractions completed on April 23, 2020.  He is status post definitive radiation to intrathoracic lymph node completed on November 27, 2021.  He received 40 Gray in 10 fractions. 04/11/22---CT abdomen and pelvis shows mild left hydro ureteral nephrosis with extensive urothelial hyperenhancement involving the left ureter until this stent in the left renal pelvis, moderate bilateral pleural effusions, possible lytic lesion in the right inferior pubic rami-similar to prior studies suggestive of osseous metastases   3)Hypothyroidism--- TSH WNL at 1.34 -Continue levothyroxine   4)COPD--- patient presented with some wheezing -Wheezing resolved with bronchodilators -Hold off on steroids at this time -Chest x-ray without pneumonia -No hypoxia, continue bronchodilators   5) acute anemia--suspect related to hemodilution in setting of IV fluids per sepsis protocol ---no evidence of ongoing bleeding -Repeat CBC in a.m.  6) Paroxysmal  Atrial Fibrillation - seen on telemetry monitoring - obtain EKG 12 lead - CHAD2VASC score of at least 2  - metoprolol 25 mg BID - apixaban 5 mg BID   7) hypokalemia - oral replacement given, recheck in AM - check magnesium, replete as needed    DVT prophylaxis: apixaban  Code Status: Full  Family Communication:  Disposition: Status is: Inpatient Remains inpatient appropriate because: intensity    Consultants:   Procedures:   Antimicrobials:    Subjective: Pt without specific complaints.  Objective: Vitals:   04/14/22 0033 04/14/22 0420 04/14/22 1215 04/14/22 1339  BP:  121/67 127/70 133/79  Pulse:  97 88 92  Resp:  20  18  Temp:  97.9 F (36.6 C)    TempSrc:      SpO2: 96% 93% 97% 97%  Weight:      Height:        Intake/Output Summary (Last 24 hours) at 04/14/2022 1348 Last data filed at 04/14/2022 0855 Gross per 24 hour  Intake 1060 ml  Output --  Net 1060 ml   Filed Weights   04/11/22 1102  Weight: 91.3 kg   Examination:  General exam: Appears calm and comfortable  Respiratory system: Clear to auscultation. Respiratory effort normal. Cardiovascular system: normal S1 & S2 heard. No JVD, murmurs, rubs, gallops or clicks. No pedal edema. Gastrointestinal system: Abdomen is nondistended, soft and nontender. No organomegaly or masses felt. Normal bowel sounds heard. Central nervous system: Alert and oriented. No focal neurological deficits. Extremities: Symmetric 5 x 5 power. Skin: No rashes, lesions or ulcers. Psychiatry: Judgement and insight appear normal. Mood & affect appropriate.   Data Reviewed: I have personally reviewed following labs and imaging studies  CBC: Recent Labs  Lab 04/11/22 0535 04/11/22 1113 04/12/22 0507 04/14/22 0227  WBC 15.6* 23.6* 11.8* 6.3  NEUTROABS 13.6* 21.2*  --   --   HGB 15.0 14.5 11.2* 11.6*  HCT 44.5 43.3 34.5* 35.6*  MCV 105.5* 105.4* 107.5* 107.9*  PLT 185 200 126* 142*    Basic Metabolic Panel: Recent  Labs  Lab 04/11/22 0535 04/11/22 1113 04/12/22 0507 04/14/22 0227  NA 140 138 140 141  K 3.9 3.7 3.6 3.1*  CL 107 105 109 110  CO2 '26 24 27 26  ' GLUCOSE 124* 156* 100* 88  BUN '14 12 11 9  ' CREATININE 0.95 0.94 0.83 0.93  CALCIUM 8.6* 8.6* 8.0* 8.2*  MG  --   --   --  2.2    CBG: Recent Labs  Lab 04/14/22 0729  GLUCAP 95    Recent Results (from the past 240 hour(s))  Resp Panel by RT-PCR (Flu A&B, Covid) Anterior Nasal Swab     Status: None   Collection Time: 04/11/22 11:13 AM   Specimen: Anterior Nasal Swab  Result Value Ref Range Status   SARS Coronavirus 2 by RT PCR NEGATIVE NEGATIVE Final    Comment: (NOTE) SARS-CoV-2 target nucleic acids are NOT DETECTED.  The SARS-CoV-2 RNA is generally detectable in upper respiratory specimens during the acute phase of infection. The lowest concentration of SARS-CoV-2 viral copies this assay can detect is 138 copies/mL. A negative result does not preclude SARS-Cov-2 infection and should not be used as the sole basis for treatment or other patient management decisions. A negative result may occur with  improper specimen collection/handling, submission of specimen other than nasopharyngeal swab, presence of viral mutation(s) within the areas targeted by this assay, and inadequate number of viral  copies(<138 copies/mL). A negative result must be combined with clinical observations, patient history, and epidemiological information. The expected result is Negative.  Fact Sheet for Patients:  EntrepreneurPulse.com.au  Fact Sheet for Healthcare Providers:  IncredibleEmployment.be  This test is no t yet approved or cleared by the [place] FDA and  has been authorized for detection and/or diagnosis of SARS-CoV-2 by FDA under an Emergency Use Authorization (EUA). This EUA will remain  in effect (meaning this test can be used) for the duration of the COVID-19 declaration under Section 564(b)(1)  of the Act, 21 U.S.C.section 360bbb-3(b)(1), unless the authorization is terminated  or revoked sooner.       Influenza A by PCR NEGATIVE NEGATIVE Final   Influenza B by PCR NEGATIVE NEGATIVE Final    Comment: (NOTE) The Xpert Xpress SARS-CoV-2/FLU/RSV plus assay is intended as an aid in the diagnosis of influenza from Nasopharyngeal swab specimens and should not be used as a sole basis for treatment. Nasal washings and aspirates are unacceptable for Xpert Xpress SARS-CoV-2/FLU/RSV testing.  Fact Sheet for Patients: EntrepreneurPulse.com.au  Fact Sheet for Healthcare Providers: IncredibleEmployment.be  This test is not yet approved or cleared by the [place] FDA and has been authorized for detection and/or diagnosis of SARS-CoV-2 by FDA under an Emergency Use Authorization (EUA). This EUA will remain in effect (meaning this test can be used) for the duration of the COVID-19 declaration under Section 564(b)(1) of the Act, 21 U.S.C. section 360bbb-3(b)(1), unless the authorization is terminated or revoked.  Performed at [facility], [address], [place], [place] [address]   Culture, blood (routine x 2)     Status: None (Preliminary result)   Collection Time: 04/11/22 11:38 AM   Specimen: BLOOD  Result Value Ref Range Status   Specimen Description BLOOD LEFT ANTECUBITAL  Final   Special Requests   Final    Blood Culture adequate volume BOTTLES DRAWN AEROBIC AND ANAEROBIC   Culture   Final    NO GROWTH 3 DAYS Performed at [facility], [address], [place], [place] [address]    Report Status PENDING  Incomplete  Culture, blood (routine x 2)     Status: None (Preliminary result)   Collection Time: 04/11/22 11:46 AM   Specimen: BLOOD  Result Value Ref Range Status   Specimen Description BLOOD BLOOD RIGHT HAND  Final   Special Requests   Final    BOTTLES DRAWN AEROBIC AND ANAEROBIC Blood Culture adequate volume   Culture   Final     NO GROWTH 3 DAYS Performed at [facility], [address] [place], [place] [address]    Report Status PENDING  Incomplete  Urine Culture     Status: Abnormal   Collection Time: 04/11/22 11:59 AM   Specimen: Urine, Clean Catch  Result Value Ref Range Status   Specimen Description   Final    URINE, CLEAN CATCH Performed at [facility], [address], [place], [place] [address]    Special Requests   Final    NONE Performed at [facility], [address] [place], [place] [address]    Culture 20,000 COLONIES/mL PSEUDOMONAS AERUGINOSA (A)  Final   Report Status 04/13/2022 FINAL  Final   Organism ID, Bacteria PSEUDOMONAS AERUGINOSA (A)  Final      Susceptibility   Pseudomonas aeruginosa - MIC*    CEFTAZIDIME 8 SENSITIVE Sensitive     CIPROFLOXACIN 1 INTERMEDIATE Intermediate     GENTAMICIN <=1 SENSITIVE Sensitive     IMIPENEM  2 SENSITIVE Sensitive     PIP/TAZO 16 SENSITIVE Sensitive     CEFEPIME 4 SENSITIVE Sensitive     * 20,000 COLONIES/mL PSEUDOMONAS AERUGINOSA     Radiology Studies: No results found.  Scheduled Meds:  [START ON 04/15/2022] apixaban  5 mg Oral BID   heparin  5,000 Units Subcutaneous Q8H   lactose free nutrition  237 mL Oral TID BM   levothyroxine  50 mcg Oral Q0600   metoprolol tartrate  25 mg Oral BID   sodium chloride flush  3 mL Intravenous Q12H   sodium chloride flush  3 mL Intravenous Q12H   Continuous Infusions:  sodium chloride     ceFEPime (MAXIPIME) IV 1 g (04/14/22 0843)     LOS: 3 days   Time spent: 36 mins  Kimla Furth Wynetta Emery, MD How to contact the [facility] Attending or Consulting provider [place] or covering provider during after hours [place], for this patient?  Check the care team in [facility] and look for a) attending/consulting [facility] provider listed and b) the [facility] team listed Log into [email or web address] and use [place]'s universal password to access. If you do not have the password, please contact the hospital operator. Locate the [facility] provider  you are looking for under [facility] and page to a number that you can be directly reached. If you still have difficulty reaching the provider, please page the [facility] (Director on Call) for the Hospitalists listed on amion for assistance.  04/14/2022, 1:48 PM

## 2022-04-14 NOTE — Hospital Course (Signed)
81 y.o. male with past medical history relevant for h/o bladder cancer status post cystectomy/ileal conduit, recurrent urinary tract infections with MDR, hypothyroidism, BPH and GERD who presents to the ED with fevers nausea, vomiting and malaise -Patient was initially seen in the ED on 04/10/2022 treated and released for presumed UTI -Return to the ED on 04/11/2022 with worsening symptoms and persistent fevers, as well as flank pain/lower back pain and foul-smelling urine -CT abdomen and pelvis shows mild left hydro ureteral nephrosis with extensive urothelial hyperenhancement involving the left ureter until this stent in the left renal pelvis, moderate bilateral pleural effusions, possible lytic lesion in the right inferior pubic rami-similar to prior studies suggestive of osseous metastases --Chest x-ray  with chronic right apical changes and bilateral nares pleural effusions, no definite pneumonia -Lactic acid is down to 2.3 from 3.1 after IV fluids -UA suggestive for UTI urine culture pending -WBC is up to 23.6 from 15.6 within a 6-hour interval -Creatinine 0.94, LFTs are not elevated -Patient with recent history of C. difficile infection in May 2023

## 2022-04-15 ENCOUNTER — Inpatient Hospital Stay (HOSPITAL_COMMUNITY): Payer: Medicare Other

## 2022-04-15 DIAGNOSIS — I480 Paroxysmal atrial fibrillation: Secondary | ICD-10-CM | POA: Diagnosis not present

## 2022-04-15 DIAGNOSIS — J449 Chronic obstructive pulmonary disease, unspecified: Secondary | ICD-10-CM | POA: Diagnosis not present

## 2022-04-15 DIAGNOSIS — I4891 Unspecified atrial fibrillation: Secondary | ICD-10-CM

## 2022-04-15 DIAGNOSIS — A419 Sepsis, unspecified organism: Secondary | ICD-10-CM | POA: Diagnosis not present

## 2022-04-15 LAB — BASIC METABOLIC PANEL
Anion gap: 4 — ABNORMAL LOW (ref 5–15)
BUN: 10 mg/dL (ref 8–23)
CO2: 28 mmol/L (ref 22–32)
Calcium: 8.1 mg/dL — ABNORMAL LOW (ref 8.9–10.3)
Chloride: 109 mmol/L (ref 98–111)
Creatinine, Ser: 0.94 mg/dL (ref 0.61–1.24)
GFR, Estimated: >60 mL/min (ref >60)
Glucose, Bld: 96 mg/dL (ref 70–99)
Potassium: 3.5 mmol/L (ref 3.5–5.1)
Sodium: 141 mmol/L (ref 135–145)

## 2022-04-15 LAB — ECHOCARDIOGRAM COMPLETE
AR max vel: 1.58 cm2
AV Area VTI: 1.72 cm2
AV Area mean vel: 1.61 cm2
AV Mean grad: 8 mmHg
AV Peak grad: 16.2 mmHg
Ao pk vel: 2.01 m/s
Area-P 1/2: 3.91 cm2
Height: 71 in
S' Lateral: 2.8 cm
Weight: 3219.21 oz

## 2022-04-15 LAB — MAGNESIUM: Magnesium: 2.3 mg/dL (ref 1.7–2.4)

## 2022-04-15 NOTE — Discharge Instructions (Addendum)
Information on my medicine - ELIQUIS (apixaban)  This medication education was reviewed with me or my healthcare representative as part of my discharge preparation.   Why was Eliquis prescribed for you? Eliquis was prescribed for you to reduce the risk of a blood clot forming that can cause a stroke if you have a medical condition called atrial fibrillation (a type of irregular heartbeat).  What do You need to know about Eliquis ? Take your Eliquis TWICE DAILY - one tablet in the morning and one tablet in the evening with or without food. If you have difficulty swallowing the tablet whole please discuss with your pharmacist how to take the medication safely.  Take Eliquis exactly as prescribed by your doctor and DO NOT stop taking Eliquis without talking to the doctor who prescribed the medication.  Stopping may increase your risk of developing a stroke.  Refill your prescription before you run out.  After discharge, you should have regular check-up appointments with your healthcare provider that is prescribing your Eliquis.  In the future your dose may need to be changed if your kidney function or weight changes by a significant amount or as you get older.  What do you do if you miss a dose? If you miss a dose, take it as soon as you remember on the same day and resume taking twice daily.  Do not take more than one dose of ELIQUIS at the same time to make up a missed dose.  Important Safety Information A possible side effect of Eliquis is bleeding. You should call your healthcare provider right away if you experience any of the following: Bleeding from an injury or your nose that does not stop. Unusual colored urine (red or dark brown) or unusual colored stools (red or black). Unusual bruising for unknown reasons. A serious fall or if you hit your head (even if there is no bleeding).  Some medicines may interact with Eliquis and might increase your risk of bleeding or clotting while  on Eliquis. To help avoid this, consult your healthcare provider or pharmacist prior to using any new prescription or non-prescription medications, including herbals, vitamins, non-steroidal anti-inflammatory drugs (NSAIDs) and supplements.  This website has more information on Eliquis (apixaban): http://www.eliquis.com/eliquis/home    IMPORTANT INFORMATION: PAY CLOSE ATTENTION   PHYSICIAN DISCHARGE INSTRUCTIONS  Follow with Primary care provider  [name], MD  and other consultants as instructed by your Hospitalist Physician  [place] IF SYMPTOMS COME BACK, WORSEN OR NEW PROBLEM DEVELOPS   Please note: You were cared for by a hospitalist during your hospital stay. Every effort will be made to forward records to your primary care provider.  You can request that your primary care provider send for your hospital records if they have not received them.  Once you are discharged, your primary care physician will handle any further medical issues. Please note that NO REFILLS for any discharge medications will be authorized once you are discharged, as it is imperative that you return to your primary care physician (or establish a relationship with a primary care physician if you do not have one) for your post hospital discharge needs so that they can reassess your need for medications and monitor your lab values.  Please get a complete blood count and chemistry panel checked by your Primary MD at your next visit, and again as instructed by your Primary MD.  Get Medicines reviewed and adjusted: Please take all your medications with  you for your next visit with your Primary MD  Laboratory/radiological data: Please request your Primary MD to go over all hospital tests and procedure/radiological results at the follow up, please ask your primary care provider to get all Hospital records sent to his/her office.  In some cases, they will be blood work, cultures  and biopsy results pending at the time of your discharge. Please request that your primary care provider follow up on these results.  If you are diabetic, please bring your blood sugar readings with you to your follow up appointment with primary care.    Please call and make your follow up appointments as soon as possible.    Also Note the following: If you experience worsening of your admission symptoms, develop shortness of breath, life threatening emergency, suicidal or homicidal thoughts you must seek medical attention immediately by calling 911 or calling your MD immediately  if symptoms less severe.  You must read complete instructions/literature along with all the possible adverse reactions/side effects for all the Medicines you take and that have been prescribed to you. Take any new Medicines after you have completely understood and accpet all the possible adverse reactions/side effects.   Do not drive when taking Pain medications or sleeping medications (Benzodiazepines)  Do not take more than prescribed Pain, Sleep and Anxiety Medications. It is not advisable to combine anxiety,sleep and pain medications without talking with your primary care practitioner  Special Instructions: If you have smoked or chewed Tobacco  in the last 2 yrs please stop smoking, stop any regular Alcohol  and or any Recreational drug use.  Wear Seat belts while driving.  Do not drive if taking any narcotic, mind altering or controlled substances or recreational drugs or alcohol.

## 2022-04-15 NOTE — Progress Notes (Signed)
  Echocardiogram 2D Echocardiogram has been performed.  [name] 04/15/2022, 3:24 PM

## 2022-04-15 NOTE — Progress Notes (Signed)
PROGRESS NOTE   [name]  HUT:[id number] DOB: 27-Aug-1940 DOA: 04/11/2022 PCP: [name], MD   Chief Complaint  Patient presents with   Weakness   Level of care: Telemetry  Brief Admission History:  81 y.o. male with past medical history relevant for h/o bladder cancer status post cystectomy/ileal conduit, recurrent urinary tract infections with MDR, hypothyroidism, BPH and GERD who presents to the ED with fevers nausea, vomiting and malaise -Patient was initially seen in the ED on 04/10/2022 treated and released for presumed UTI -Return to the ED on 04/11/2022 with worsening symptoms and persistent fevers, as well as flank pain/lower back pain and foul-smelling urine -CT abdomen and pelvis shows mild left hydro ureteral nephrosis with extensive urothelial hyperenhancement involving the left ureter until this stent in the left renal pelvis, moderate bilateral pleural effusions, possible lytic lesion in the right inferior pubic rami-similar to prior studies suggestive of osseous metastases --Chest x-ray  with chronic right apical changes and bilateral nares pleural effusions, no definite pneumonia -Lactic acid is down to 2.3 from 3.1 after IV fluids -UA suggestive for UTI urine culture pending -WBC is up to 23.6 from 15.6 within a 6-hour interval -Creatinine 0.94, LFTs are not elevated -Patient with recent history of C. difficile infection in May 2023   Assessment and Plan:  Pseudomonas sepsis secondary to presumed urinary source--POA -patient met sepsis criteria on admission with with persistent fevers (up to 103.3), Tachycardia, tachypnea and leukocytosis (-WBC is up to 23.6 from 15.6 within a 6-hour interval) -Patient with history of recurrent UTIs from MDR organisms in the setting of history of bladder cancer status post prior cystectomy with ileal conduit --Lactic acid is down to 2.3 from 3.1 after IV fluids -Urine culture from 04/11/2022 with Pseudomonas aeruginosa -Blood  cultures NGTD -Urine culture from 03/17/2022 with MDR Klebsiella pneumoniae -Urine culture from 03/03/2022 with MD Pseudomonas -Initially treated with IV IV ertapenem , okay to de-escalate to cefepime -Received IV fluids per sepsis protocol WBC 15.6 >>23.6>>11.8  Pseudomonas sepsis due to Urinary Source--h/o bladder ca -Continue IV cefepime, DC vanc as MRSA screen negative   2) History of stage IV bladder cancer with metastatic to the lung and bones -Patient's stage IV high-grade urothelial carcinoma was originally diagnosed 2018 with pulmonary and bone involvement. T3bN0 disease. This tumor found to have PDL 1 positive  with CPS score over 90%.  -Patient follows with Dr. Sol Passer -Patient previously had  laparoscopic Cystoprostatectomy and bilateral lymphadenectomy done by Dr. Tresa Moore on 06/29/2017 -He completed radiation therapy to the pelvis on January 16, 2018 under the care of Dr. Tammi Klippel. He is status post radiation therapy to the right paratracheal lymph node for a total of 50 Gray in 10 fractions completed on April 23, 2020.  He is status post definitive radiation to intrathoracic lymph node completed on November 27, 2021.  He received 40 Gray in 10 fractions. 04/11/22---CT abdomen and pelvis shows mild left hydro ureteral nephrosis with extensive urothelial hyperenhancement involving the left ureter until this stent in the left renal pelvis, moderate bilateral pleural effusions, possible lytic lesion in the right inferior pubic rami-similar to prior studies suggestive of osseous metastases   3)Hypothyroidism--- TSH WNL at 1.34 -Continue levothyroxine   4)COPD--- patient presented with some wheezing -Wheezing resolved with bronchodilators -Hold off on steroids at this time -Chest x-ray without pneumonia -No hypoxia, continue bronchodilators   5) acute anemia--suspect related to hemodilution in setting of IV fluids per sepsis protocol ---no evidence of ongoing bleeding -  Follow CBC   6) Paroxysmal Atrial Fibrillation - seen on telemetry monitoring - obtain EKG 12 lead - CHAD2VASC score of at least 2  - metoprolol 25 mg BID - apixaban 5 mg BID   7) hypokalemia - oral replacement given, recheck in AM - magnesium WNL, replete as needed    DVT prophylaxis: apixaban  Code Status: Full  Family Communication:  Disposition: Status is: Inpatient Remains inpatient appropriate because: intensity    Consultants:   Procedures:   Antimicrobials:    Subjective: Pt without specific complaints.  Objective: Vitals:   04/15/22 0935 04/15/22 1234 04/15/22 1324 04/15/22 1341  BP: 110/62 135/85  128/68  Pulse: 78 71 78 79  Resp:  20 16   Temp:  98.1 F (36.7 C)  97.7 F (36.5 C)  TempSrc:  Oral  Oral  SpO2:  96% 98% 97%  Weight:      Height:        Intake/Output Summary (Last 24 hours) at 04/15/2022 1738 Last data filed at 04/15/2022 6701 Gross per 24 hour  Intake --  Output 200 ml  Net -200 ml   Filed Weights   04/11/22 1102  Weight: 91.3 kg   Examination:  General exam: Appears calm and comfortable  Respiratory system: Clear to auscultation. Respiratory effort normal. Cardiovascular system: normal S1 & S2 heard. No JVD, murmurs, rubs, gallops or clicks. No pedal edema. Gastrointestinal system: Abdomen is nondistended, soft and nontender. No organomegaly or masses felt. Normal bowel sounds heard. Central nervous system: Alert and oriented. No focal neurological deficits. Extremities: Symmetric 5 x 5 power. Skin: No rashes, lesions or ulcers. Psychiatry: Judgement and insight appear normal. Mood & affect appropriate.   Data Reviewed: I have personally reviewed following labs and imaging studies  CBC: Recent Labs  Lab 04/11/22 0535 04/11/22 1113 04/12/22 0507 04/14/22 0227  WBC 15.6* 23.6* 11.8* 6.3  NEUTROABS 13.6* 21.2*  --   --   HGB 15.0 14.5 11.2* 11.6*  HCT 44.5 43.3 34.5* 35.6*  MCV 105.5* 105.4* 107.5* 107.9*  PLT 185 200  126* 142*    Basic Metabolic Panel: Recent Labs  Lab 04/11/22 0535 04/11/22 1113 04/12/22 0507 04/14/22 0227 04/15/22 0448  NA 140 138 140 141 141  K 3.9 3.7 3.6 3.1* 3.5  CL 107 105 109 110 109  CO2 _0 GLUCOSE 124* 156* 100* 88 96  BUN _1 CREATININE 0.95 0.94 0.83 0.93 0.94  CALCIUM 8.6* 8.6* 8.0* 8.2* 8.1*  MG  --   --   --  2.2 2.3    CBG: Recent Labs  Lab 04/14/22 0729  GLUCAP 95    Recent Results (from the past 240 hour(s))  Resp Panel by RT-PCR (Flu A&B, Covid) Anterior Nasal Swab     Status: None   Collection Time: 04/11/22 11:13 AM   Specimen: Anterior Nasal Swab  Result Value Ref Range Status   SARS Coronavirus 2 by RT PCR NEGATIVE NEGATIVE Final    Comment: (NOTE) SARS-CoV-2 target nucleic acids are NOT DETECTED.  The SARS-CoV-2 RNA is generally detectable in upper respiratory specimens during the acute phase of infection. The lowest concentration of SARS-CoV-2 viral copies this assay can detect is 138 copies/mL. A negative result does not preclude SARS-Cov-2 infection and should not be used as the sole basis for treatment or other patient management decisions. A negative result may occur with  improper specimen collection/handling, submission of specimen other than nasopharyngeal  swab, presence of viral mutation(s) within the areas targeted by this assay, and inadequate number of viral copies(<138 copies/mL). A negative result must be combined with clinical observations, patient history, and epidemiological information. The expected result is Negative.  Fact Sheet for Patients:  EntrepreneurPulse.com.au  Fact Sheet for Healthcare Providers:  IncredibleEmployment.be  This test is no t yet approved or cleared by the [place] FDA and  has been authorized for detection and/or diagnosis of SARS-CoV-2 by FDA under an Emergency Use Authorization (EUA). This EUA will remain  in effect  (meaning this test can be used) for the duration of the COVID-19 declaration under Section 564(b)(1) of the Act, 21 U.S.C.section 360bbb-3(b)(1), unless the authorization is terminated  or revoked sooner.       Influenza A by PCR NEGATIVE NEGATIVE Final   Influenza B by PCR NEGATIVE NEGATIVE Final    Comment: (NOTE) The Xpert Xpress SARS-CoV-2/FLU/RSV plus assay is intended as an aid in the diagnosis of influenza from Nasopharyngeal swab specimens and should not be used as a sole basis for treatment. Nasal washings and aspirates are unacceptable for Xpert Xpress SARS-CoV-2/FLU/RSV testing.  Fact Sheet for Patients: EntrepreneurPulse.com.au  Fact Sheet for Healthcare Providers: IncredibleEmployment.be  This test is not yet approved or cleared by the [place] FDA and has been authorized for detection and/or diagnosis of SARS-CoV-2 by FDA under an Emergency Use Authorization (EUA). This EUA will remain in effect (meaning this test can be used) for the duration of the COVID-19 declaration under Section 564(b)(1) of the Act, 21 U.S.C. section 360bbb-3(b)(1), unless the authorization is terminated or revoked.  Performed at [facility], [address], [place], [place] [address]   Culture, blood (routine x 2)     Status: None (Preliminary result)   Collection Time: 04/11/22 11:38 AM   Specimen: BLOOD  Result Value Ref Range Status   Specimen Description BLOOD LEFT ANTECUBITAL  Final   Special Requests   Final    Blood Culture adequate volume BOTTLES DRAWN AEROBIC AND ANAEROBIC   Culture   Final    NO GROWTH 4 DAYS Performed at [facility], [address] [place], [place] [address]    Report Status PENDING  Incomplete  Culture, blood (routine x 2)     Status: None (Preliminary result)   Collection Time: 04/11/22 11:46 AM   Specimen: BLOOD  Result Value Ref Range Status   Specimen Description BLOOD BLOOD RIGHT HAND  Final   Special  Requests   Final    BOTTLES DRAWN AEROBIC AND ANAEROBIC Blood Culture adequate volume   Culture   Final    NO GROWTH 4 DAYS Performed at [facility], [address], [place], [place] [address]    Report Status PENDING  Incomplete  Urine Culture     Status: Abnormal   Collection Time: 04/11/22 11:59 AM   Specimen: Urine, Clean Catch  Result Value Ref Range Status   Specimen Description   Final    URINE, CLEAN CATCH Performed at [facility], [address] [place], [place] [address]    Special Requests   Final    NONE Performed at [facility], [address], [place], [place] [address]    Culture 20,000 COLONIES/mL PSEUDOMONAS AERUGINOSA (A)  Final   Report Status 04/13/2022 FINAL  Final   Organism ID, Bacteria PSEUDOMONAS AERUGINOSA (A)  Final      Susceptibility   Pseudomonas aeruginosa - MIC*    CEFTAZIDIME 8 SENSITIVE Sensitive  CIPROFLOXACIN 1 INTERMEDIATE Intermediate     GENTAMICIN <=1 SENSITIVE Sensitive     IMIPENEM 2 SENSITIVE Sensitive     PIP/TAZO 16 SENSITIVE Sensitive     CEFEPIME 4 SENSITIVE Sensitive     * 20,000 COLONIES/mL PSEUDOMONAS AERUGINOSA     Radiology Studies: ECHOCARDIOGRAM COMPLETE  Result Date: 04/15/2022    ECHOCARDIOGRAM REPORT   Patient Name:   [name] Date of Exam: 04/15/2022 Medical Rec #:  [id number]      Height:       71.0 in Accession #:    [id number]     Weight:       201.2 lb Date of Birth:  Dec 28, 1940      BSA:          2.114 m Patient Age:    40 years       BP:           128/68 mmHg Patient Gender: M              HR:           76 bpm. Exam Location:  Inpatient Procedure: 2D Echo Indications:    atrial fibrillation  History:        Patient has prior history of Echocardiogram examinations, most                 recent 04/14/2018. CAD; COPD and Cancer.  Sonographer:    Johny Chess RDCS Referring Phys: [facility]  1. Left ventricular ejection fraction, by estimation, is 60 to 65%. The left ventricle has  normal function. The left ventricle has no regional wall motion abnormalities. There is mild concentric left ventricular hypertrophy. Left ventricular diastolic parameters were normal.  2. Right ventricular systolic function is normal. The right ventricular size is normal. There is normal pulmonary artery systolic pressure. The estimated right ventricular systolic pressure is 41.3 mmHg.  3. The mitral valve is grossly normal. Trivial mitral valve regurgitation.  4. The aortic valve is tricuspid. There is moderate calcification of the aortic valve. Aortic valve regurgitation is not visualized. Mild aortic valve stenosis. Aortic valve area, by VTI measures 1.72 cm. Aortic valve mean gradient measures 8.0 mmHg.  5. The inferior vena cava is normal in size with greater than 50% respiratory variability, suggesting right atrial pressure of 3 mmHg. Comparison(s): Prior images unable to be directly viewed. FINDINGS  Left Ventricle: Left ventricular ejection fraction, by estimation, is 60 to 65%. The left ventricle has normal function. The left ventricle has no regional wall motion abnormalities. The left ventricular internal cavity size was normal in size. There is  mild concentric left ventricular hypertrophy. Left ventricular diastolic parameters were normal. Right Ventricle: The right ventricular size is normal. No increase in right ventricular wall thickness. Right ventricular systolic function is normal. There is normal pulmonary artery systolic pressure. The tricuspid regurgitant velocity is 2.78 m/s, and  with an assumed right atrial pressure of 3 mmHg, the estimated right ventricular systolic pressure is 24.4 mmHg. Left Atrium: Left atrial size was normal in size. Right Atrium: Right atrial size was normal in size. Pericardium: There is no evidence of pericardial effusion. Presence of epicardial fat layer. Mitral Valve: The mitral valve is grossly normal. Trivial mitral valve regurgitation. Tricuspid Valve: The  tricuspid valve is grossly normal. Tricuspid valve regurgitation is mild. Aortic Valve: The aortic valve is tricuspid. There is moderate calcification of the aortic valve. Aortic valve regurgitation is not visualized. Mild aortic stenosis  is present. Aortic valve mean gradient measures 8.0 mmHg. Aortic valve peak gradient measures 16.2 mmHg. Aortic valve area, by VTI measures 1.72 cm. Pulmonic Valve: The pulmonic valve was not well visualized. Pulmonic valve regurgitation is trivial. Aorta: The aortic root is normal in size and structure. Venous: The inferior vena cava is normal in size with greater than 50% respiratory variability, suggesting right atrial pressure of 3 mmHg. IAS/Shunts: No atrial level shunt detected by color flow Doppler.  LEFT VENTRICLE PLAX 2D LVIDd:         4.30 cm   Diastology LVIDs:         2.80 cm   LV e' medial:    8.49 cm/s LV PW:         1.00 cm   LV E/e' medial:  9.7 LV IVS:        1.10 cm   LV e' lateral:   8.49 cm/s LVOT diam:     2.00 cm   LV E/e' lateral: 9.7 LV SV:         63 LV SV Index:   30 LVOT Area:     3.14 cm  RIGHT VENTRICLE             IVC RV S prime:     12.60 cm/s  IVC diam: 1.90 cm TAPSE (M-mode): 2.3 cm LEFT ATRIUM             Index        RIGHT ATRIUM           Index LA diam:        3.40 cm 1.61 cm/m   RA Area:     12.30 cm LA Vol (A2C):   54.2 ml 25.64 ml/m  RA Volume:   26.70 ml  12.63 ml/m LA Vol (A4C):   36.4 ml 17.22 ml/m LA Biplane Vol: 44.4 ml 21.00 ml/m  AORTIC VALVE                     PULMONIC VALVE AV Area (Vmax):    1.58 cm      PV Vmax:       1.92 m/s AV Area (Vmean):   1.61 cm      PV Vmean:      122.000 cm/s AV Area (VTI):     1.72 cm      PV VTI:        0.355 m AV Vmax:           201.00 cm/s   PV Peak grad:  14.7 mmHg AV Vmean:          133.000 cm/s  PV Mean grad:  7.0 mmHg AV VTI:            0.368 m AV Peak Grad:      16.2 mmHg AV Mean Grad:      8.0 mmHg LVOT Vmax:         101.00 cm/s LVOT Vmean:        68.100 cm/s LVOT VTI:          0.202  m LVOT/AV VTI ratio: 0.55  AORTA Ao Root diam: 3.10 cm Ao Asc diam:  3.60 cm MITRAL VALVE               TRICUSPID VALVE MV Area (PHT): 3.91 cm    TR Peak grad:   30.9 mmHg MV Decel Time: 194 msec    TR Vmax:  278.00 cm/s MV E velocity: 82.70 cm/s MV A velocity: 98.10 cm/s  SHUNTS MV E/A ratio:  0.84        Systemic VTI:  0.20 m                            Systemic Diam: 2.00 cm Rozann Lesches MD Electronically signed by Rozann Lesches MD Signature Date/Time: 04/15/2022/3:27:54 PM    Final     Scheduled Meds:  apixaban  5 mg Oral BID   ipratropium-albuterol  3 mL Nebulization TID   lactose free nutrition  237 mL Oral TID BM   levothyroxine  50 mcg Oral Q0600   metoprolol tartrate  25 mg Oral BID   sodium chloride flush  3 mL Intravenous Q12H   sodium chloride flush  3 mL Intravenous Q12H   Continuous Infusions:  sodium chloride     ceFEPime (MAXIPIME) IV 1 g (04/15/22 0942)     LOS: 4 days   Time spent: 35 mins  Rakisha Pincock Wynetta Emery, MD How to contact the [facility] Attending or Consulting provider [facility] or covering provider during after hours [place], for this patient?  Check the care team in [facility] and look for a) attending/consulting [facility] provider listed and b) the [facility] team listed Log into [email or web address] and use [facility] universal password to access. If you do not have the password, please contact the hospital operator. Locate the [facility] provider you are looking for under [facility] and page to a number that you can be directly reached. If you still have difficulty reaching the provider, please page the [facility] (Director on Call) for the Hospitalists listed on amion for assistance.  04/15/2022, 5:38 PM

## 2022-04-16 DIAGNOSIS — J449 Chronic obstructive pulmonary disease, unspecified: Secondary | ICD-10-CM | POA: Diagnosis not present

## 2022-04-16 DIAGNOSIS — I480 Paroxysmal atrial fibrillation: Secondary | ICD-10-CM | POA: Diagnosis not present

## 2022-04-16 DIAGNOSIS — A419 Sepsis, unspecified organism: Secondary | ICD-10-CM | POA: Diagnosis not present

## 2022-04-16 LAB — CULTURE, BLOOD (ROUTINE X 2)
Culture: NO GROWTH
Culture: NO GROWTH
Special Requests: ADEQUATE
Special Requests: ADEQUATE

## 2022-04-16 MED ORDER — PANTOPRAZOLE SODIUM 40 MG PO TBEC
40.0000 mg | DELAYED_RELEASE_TABLET | Freq: Two times a day (BID) | ORAL | Status: DC
  Administered 2022-04-16 – 2022-04-17 (×3): 40 mg via ORAL
  Filled 2022-04-16 (×3): qty 1

## 2022-04-16 MED ORDER — DEXTROMETHORPHAN POLISTIREX ER 30 MG/5ML PO SUER
30.0000 mg | Freq: Two times a day (BID) | ORAL | Status: DC
  Administered 2022-04-16 – 2022-04-17 (×3): 30 mg via ORAL
  Filled 2022-04-16 (×3): qty 5

## 2022-04-16 NOTE — Care Management Important Message (Signed)
Important Message  Patient Details  Name: [name] MRN: [id number] Date of Birth: [birth year]   Medicare Important Message Given:  Yes     [name] 04/16/2022, 12:40 PM

## 2022-04-16 NOTE — Progress Notes (Signed)
Patients nebs have been dc except for prn. Breath sounds appear clear. Hr 75 f 16. Also patient complaining about nebs didn't want any more.

## 2022-04-16 NOTE — Progress Notes (Signed)
PROGRESS NOTE   [name]  HDQ:[id number] DOB: [birth year] DOA: 04/11/2022 PCP: [name], MD   Chief Complaint  Patient presents with   Weakness   Level of care: Med-Surg  Brief Admission History:  81 y.o. male with past medical history relevant for h/o bladder cancer status post cystectomy/ileal conduit, recurrent urinary tract infections with MDR, hypothyroidism, BPH and GERD who presents to the ED with fevers nausea, vomiting and malaise -Patient was initially seen in the ED on 04/10/2022 treated and released for presumed UTI -Return to the ED on 04/11/2022 with worsening symptoms and persistent fevers, as well as flank pain/lower back pain and foul-smelling urine -CT abdomen and pelvis shows mild left hydro ureteral nephrosis with extensive urothelial hyperenhancement involving the left ureter until this stent in the left renal pelvis, moderate bilateral pleural effusions, possible lytic lesion in the right inferior pubic rami-similar to prior studies suggestive of osseous metastases --Chest x-ray  with chronic right apical changes and bilateral nares pleural effusions, no definite pneumonia -Lactic acid is down to 2.3 from 3.1 after IV fluids -UA suggestive for UTI urine culture pending -WBC is up to 23.6 from 15.6 within a 6-hour interval -Creatinine 0.94, LFTs are not elevated -Patient with recent history of C. difficile infection in May 2023   Assessment and Plan:  Pseudomonas sepsis secondary to presumed urinary source--POA -patient met sepsis criteria on admission with with persistent fevers (up to 103.3), Tachycardia, tachypnea and leukocytosis (-WBC is up to 23.6 from 15.6 within a 6-hour interval) -Patient with history of recurrent UTIs from MDR organisms in the setting of history of bladder cancer status post prior cystectomy with ileal conduit --Lactic acid is down to 2.3 from 3.1 after IV fluids -Urine culture from 04/11/2022 with Pseudomonas aeruginosa -Blood  cultures NGTD -Urine culture from 03/17/2022 with MDR Klebsiella pneumoniae -Urine culture from 03/03/2022 with MD Pseudomonas -Initially treated with IV IV ertapenem , okay to de-escalate to cefepime -Received IV fluids per sepsis protocol WBC 15.6 >>23.6>>11.8  Pseudomonas sepsis due to Urinary Source--h/o bladder ca -Continue IV cefepime thru 04/17/22, DC vanc as MRSA screen negative   2) History of stage IV bladder cancer with metastatic to the lung and bones -Patient's stage IV high-grade urothelial carcinoma was originally diagnosed 2018 with pulmonary and bone involvement. T3bN0 disease. This tumor found to have PDL 1 positive  with CPS score over 90%.  -Patient follows with Dr. Sol Passer -Patient previously had  laparoscopic Cystoprostatectomy and bilateral lymphadenectomy done by Dr. Tresa Moore on 06/29/2017 -He completed radiation therapy to the pelvis on January 16, 2018 under the care of Dr. Tammi Klippel. He is status post radiation therapy to the right paratracheal lymph node for a total of 50 Gray in 10 fractions completed on April 23, 2020.  He is status post definitive radiation to intrathoracic lymph node completed on November 27, 2021.  He received 40 Gray in 10 fractions. 04/11/22---CT abdomen and pelvis shows mild left hydro ureteral nephrosis with extensive urothelial hyperenhancement involving the left ureter until this stent in the left renal pelvis, moderate bilateral pleural effusions, possible lytic lesion in the right inferior pubic rami-similar to prior studies suggestive of osseous metastases   3)Hypothyroidism--- TSH WNL at 1.34 -Continue levothyroxine   4)COPD--- patient presented with some wheezing -Wheezing resolved with bronchodilators -Hold off on steroids at this time -Chest x-ray without pneumonia -No hypoxia, continue bronchodilators   5) acute anemia--suspect related to hemodilution in setting of IV fluids per sepsis protocol ---no evidence of  ongoing  bleeding - Follow CBC   6) Paroxysmal Atrial Fibrillation - seen on telemetry monitoring - obtain EKG 12 lead - CHAD2VASC score of at least 2  - metoprolol 25 mg BID - apixaban 5 mg BID  - TTE below was reassuring  7) hypokalemia - oral replacement given, recheck in AM - magnesium WNL, replete as needed    DVT prophylaxis: apixaban  Code Status: Full  Family Communication: wife at bedside updated 8/31, 9/1 Disposition: Status is: Inpatient Remains inpatient appropriate because: intensity    Consultants:   Procedures:   Antimicrobials:    Subjective: Pt without specific complaints.  Objective: Vitals:   04/15/22 2040 04/15/22 2146 04/16/22 0623 04/16/22 0738  BP:  117/87 116/72   Pulse:  98 73   Resp:  18 18   Temp:  98.7 F (37.1 C) (!) 97.5 F (36.4 C)   TempSrc:  Oral    SpO2: 95% 92% 97% 95%  Weight:      Height:        Intake/Output Summary (Last 24 hours) at 04/16/2022 0836 Last data filed at 04/15/2022 0942 Gross per 24 hour  Intake --  Output 200 ml  Net -200 ml   Filed Weights   04/11/22 1102  Weight: 91.3 kg   Examination:  General exam: Appears calm and comfortable  Respiratory system: Clear to auscultation. Respiratory effort normal. Cardiovascular system: normal S1 & S2 heard. No JVD, murmurs, rubs, gallops or clicks. No pedal edema. Gastrointestinal system: Abdomen is nondistended, soft and nontender. No organomegaly or masses felt. Normal bowel sounds heard. Central nervous system: Alert and oriented. No focal neurological deficits. Extremities: Symmetric 5 x 5 power. Skin: No rashes, lesions or ulcers. Psychiatry: Judgement and insight appear normal. Mood & affect appropriate.   Data Reviewed: I have personally reviewed following labs and imaging studies  CBC: Recent Labs  Lab 04/11/22 0535 04/11/22 1113 04/12/22 0507 04/14/22 0227  WBC 15.6* 23.6* 11.8* 6.3  NEUTROABS 13.6* 21.2*  --   --   HGB 15.0 14.5 11.2* 11.6*  HCT 44.5  43.3 34.5* 35.6*  MCV 105.5* 105.4* 107.5* 107.9*  PLT 185 200 126* 142*    Basic Metabolic Panel: Recent Labs  Lab 04/11/22 0535 04/11/22 1113 04/12/22 0507 04/14/22 0227 04/15/22 0448  NA 140 138 140 141 141  K 3.9 3.7 3.6 3.1* 3.5  CL 107 105 109 110 109  CO2 '26 24 27 26 28  ' GLUCOSE 124* 156* 100* 88 96  BUN '14 12 11 9 10  ' CREATININE 0.95 0.94 0.83 0.93 0.94  CALCIUM 8.6* 8.6* 8.0* 8.2* 8.1*  MG  --   --   --  2.2 2.3    CBG: Recent Labs  Lab 04/14/22 0729  GLUCAP 95    Recent Results (from the past 240 hour(s))  Resp Panel by RT-PCR (Flu A&B, Covid) Anterior Nasal Swab     Status: None   Collection Time: 04/11/22 11:13 AM   Specimen: Anterior Nasal Swab  Result Value Ref Range Status   SARS Coronavirus 2 by RT PCR NEGATIVE NEGATIVE Final    Comment: (NOTE) SARS-CoV-2 target nucleic acids are NOT DETECTED.  The SARS-CoV-2 RNA is generally detectable in upper respiratory specimens during the acute phase of infection. The lowest concentration of SARS-CoV-2 viral copies this assay can detect is 138 copies/mL. A negative result does not preclude SARS-Cov-2 infection and should not be used as the sole basis for treatment or other patient management decisions. A  negative result may occur with  improper specimen collection/handling, submission of specimen other than nasopharyngeal swab, presence of viral mutation(s) within the areas targeted by this assay, and inadequate number of viral copies(<138 copies/mL). A negative result must be combined with clinical observations, patient history, and epidemiological information. The expected result is Negative.  Fact Sheet for Patients:  EntrepreneurPulse.com.au  Fact Sheet for Healthcare Providers:  IncredibleEmployment.be  This test is no t yet approved or cleared by the [place] FDA and  has been authorized for detection and/or diagnosis of SARS-CoV-2 by FDA under an  Emergency Use Authorization (EUA). This EUA will remain  in effect (meaning this test can be used) for the duration of the COVID-19 declaration under Section 564(b)(1) of the Act, 21 U.S.C.section 360bbb-3(b)(1), unless the authorization is terminated  or revoked sooner.       Influenza A by PCR NEGATIVE NEGATIVE Final   Influenza B by PCR NEGATIVE NEGATIVE Final    Comment: (NOTE) The Xpert Xpress SARS-CoV-2/FLU/RSV plus assay is intended as an aid in the diagnosis of influenza from Nasopharyngeal swab specimens and should not be used as a sole basis for treatment. Nasal washings and aspirates are unacceptable for Xpert Xpress SARS-CoV-2/FLU/RSV testing.  Fact Sheet for Patients: EntrepreneurPulse.com.au  Fact Sheet for Healthcare Providers: IncredibleEmployment.be  This test is not yet approved or cleared by the [place] FDA and has been authorized for detection and/or diagnosis of SARS-CoV-2 by FDA under an Emergency Use Authorization (EUA). This EUA will remain in effect (meaning this test can be used) for the duration of the COVID-19 declaration under Section 564(b)(1) of the Act, 21 U.S.C. section 360bbb-3(b)(1), unless the authorization is terminated or revoked.  Performed at [facility], [address] [place], [place] [address]   Culture, blood (routine x 2)     Status: None   Collection Time: 04/11/22 11:38 AM   Specimen: BLOOD  Result Value Ref Range Status   Specimen Description BLOOD LEFT ANTECUBITAL  Final   Special Requests   Final    Blood Culture adequate volume BOTTLES DRAWN AEROBIC AND ANAEROBIC   Culture   Final    NO GROWTH 5 DAYS Performed at [facility], [address], [place], [place] [address]    Report Status 04/16/2022 FINAL  Final  Culture, blood (routine x 2)     Status: None   Collection Time: 04/11/22 11:46 AM   Specimen: BLOOD  Result Value Ref Range Status   Specimen Description BLOOD BLOOD  RIGHT HAND  Final   Special Requests   Final    BOTTLES DRAWN AEROBIC AND ANAEROBIC Blood Culture adequate volume   Culture   Final    NO GROWTH 5 DAYS Performed at [facility], [address], [place], [place] [address]    Report Status 04/16/2022 FINAL  Final  Urine Culture     Status: Abnormal   Collection Time: 04/11/22 11:59 AM   Specimen: Urine, Clean Catch  Result Value Ref Range Status   Specimen Description   Final    URINE, CLEAN CATCH Performed at [facility], [address] [place], [place] [address]    Special Requests   Final    NONE Performed at [facility], [address] [place],  [address]    Culture 20,000 COLONIES/mL PSEUDOMONAS AERUGINOSA (A)  Final   Report Status 04/13/2022 FINAL  Final   Organism ID, Bacteria PSEUDOMONAS AERUGINOSA (A)  Final      Susceptibility   Pseudomonas aeruginosa -  MIC*    CEFTAZIDIME 8 SENSITIVE Sensitive     CIPROFLOXACIN 1 INTERMEDIATE Intermediate     GENTAMICIN <=1 SENSITIVE Sensitive     IMIPENEM 2 SENSITIVE Sensitive     PIP/TAZO 16 SENSITIVE Sensitive     CEFEPIME 4 SENSITIVE Sensitive     * 20,000 COLONIES/mL PSEUDOMONAS AERUGINOSA     Radiology Studies: ECHOCARDIOGRAM COMPLETE  Result Date: 04/15/2022    ECHOCARDIOGRAM REPORT   Patient Name:   BRANDN MCGATH Date of Exam: 04/15/2022 Medical Rec #:  [id number]      Height:       71.0 in Accession #:    [id number]     Weight:       201.2 lb Date of Birth:  [birth year]      BSA:          2.114 m Patient Age:    29 years       BP:           128/68 mmHg Patient Gender: M              HR:           76 bpm. Exam Location:  Inpatient Procedure: 2D Echo Indications:    atrial fibrillation  History:        Patient has prior history of Echocardiogram examinations, most                 recent 04/14/2018. CAD; COPD and Cancer.  Sonographer:    Johny Chess RDCS Referring Phys: [place]  1. Left ventricular ejection fraction, by estimation, is 60 to  65%. The left ventricle has normal function. The left ventricle has no regional wall motion abnormalities. There is mild concentric left ventricular hypertrophy. Left ventricular diastolic parameters were normal.  2. Right ventricular systolic function is normal. The right ventricular size is normal. There is normal pulmonary artery systolic pressure. The estimated right ventricular systolic pressure is 50.3 mmHg.  3. The mitral valve is grossly normal. Trivial mitral valve regurgitation.  4. The aortic valve is tricuspid. There is moderate calcification of the aortic valve. Aortic valve regurgitation is not visualized. Mild aortic valve stenosis. Aortic valve area, by VTI measures 1.72 cm. Aortic valve mean gradient measures 8.0 mmHg.  5. The inferior vena cava is normal in size with greater than 50% respiratory variability, suggesting right atrial pressure of 3 mmHg. Comparison(s): Prior images unable to be directly viewed. FINDINGS  Left Ventricle: Left ventricular ejection fraction, by estimation, is 60 to 65%. The left ventricle has normal function. The left ventricle has no regional wall motion abnormalities. The left ventricular internal cavity size was normal in size. There is  mild concentric left ventricular hypertrophy. Left ventricular diastolic parameters were normal. Right Ventricle: The right ventricular size is normal. No increase in right ventricular wall thickness. Right ventricular systolic function is normal. There is normal pulmonary artery systolic pressure. The tricuspid regurgitant velocity is 2.78 m/s, and  with an assumed right atrial pressure of 3 mmHg, the estimated right ventricular systolic pressure is 88.8 mmHg. Left Atrium: Left atrial size was normal in size. Right Atrium: Right atrial size was normal in size. Pericardium: There is no evidence of pericardial effusion. Presence of epicardial fat layer. Mitral Valve: The mitral valve is grossly normal. Trivial mitral valve  regurgitation. Tricuspid Valve: The tricuspid valve is grossly normal. Tricuspid valve regurgitation is mild. Aortic Valve: The aortic valve is tricuspid. There is moderate calcification of  the aortic valve. Aortic valve regurgitation is not visualized. Mild aortic stenosis is present. Aortic valve mean gradient measures 8.0 mmHg. Aortic valve peak gradient measures 16.2 mmHg. Aortic valve area, by VTI measures 1.72 cm. Pulmonic Valve: The pulmonic valve was not well visualized. Pulmonic valve regurgitation is trivial. Aorta: The aortic root is normal in size and structure. Venous: The inferior vena cava is normal in size with greater than 50% respiratory variability, suggesting right atrial pressure of 3 mmHg. IAS/Shunts: No atrial level shunt detected by color flow Doppler.  LEFT VENTRICLE PLAX 2D LVIDd:         4.30 cm   Diastology LVIDs:         2.80 cm   LV e' medial:    8.49 cm/s LV PW:         1.00 cm   LV E/e' medial:  9.7 LV IVS:        1.10 cm   LV e' lateral:   8.49 cm/s LVOT diam:     2.00 cm   LV E/e' lateral: 9.7 LV SV:         63 LV SV Index:   30 LVOT Area:     3.14 cm  RIGHT VENTRICLE             IVC RV S prime:     12.60 cm/s  IVC diam: 1.90 cm TAPSE (M-mode): 2.3 cm LEFT ATRIUM             Index        RIGHT ATRIUM           Index LA diam:        3.40 cm 1.61 cm/m   RA Area:     12.30 cm LA Vol (A2C):   54.2 ml 25.64 ml/m  RA Volume:   26.70 ml  12.63 ml/m LA Vol (A4C):   36.4 ml 17.22 ml/m LA Biplane Vol: 44.4 ml 21.00 ml/m  AORTIC VALVE                     PULMONIC VALVE AV Area (Vmax):    1.58 cm      PV Vmax:       1.92 m/s AV Area (Vmean):   1.61 cm      PV Vmean:      122.000 cm/s AV Area (VTI):     1.72 cm      PV VTI:        0.355 m AV Vmax:           201.00 cm/s   PV Peak grad:  14.7 mmHg AV Vmean:          133.000 cm/s  PV Mean grad:  7.0 mmHg AV VTI:            0.368 m AV Peak Grad:      16.2 mmHg AV Mean Grad:      8.0 mmHg LVOT Vmax:         101.00 cm/s LVOT Vmean:         68.100 cm/s LVOT VTI:          0.202 m LVOT/AV VTI ratio: 0.55  AORTA Ao Root diam: 3.10 cm Ao Asc diam:  3.60 cm MITRAL VALVE               TRICUSPID VALVE MV Area (PHT): 3.91 cm    TR Peak grad:   30.9 mmHg MV Decel Time: 194 msec  TR Vmax:        278.00 cm/s MV E velocity: 82.70 cm/s MV A velocity: 98.10 cm/s  SHUNTS MV E/A ratio:  0.84        Systemic VTI:  0.20 m                            Systemic Diam: 2.00 cm Rozann Lesches MD Electronically signed by Rozann Lesches MD Signature Date/Time: 04/15/2022/3:27:54 PM    Final     Scheduled Meds:  apixaban  5 mg Oral BID   ipratropium-albuterol  3 mL Nebulization TID   lactose free nutrition  237 mL Oral TID BM   levothyroxine  50 mcg Oral Q0600   metoprolol tartrate  25 mg Oral BID   pantoprazole  40 mg Oral BID   sodium chloride flush  3 mL Intravenous Q12H   sodium chloride flush  3 mL Intravenous Q12H   Continuous Infusions:  sodium chloride     ceFEPime (MAXIPIME) IV 1 g (04/16/22 0230)     LOS: 5 days   Time spent: 35 mins  Dreshawn Hendershott Wynetta Emery, MD How to contact the [facility] Attending or Consulting provider [place] or covering provider during after hours [place], for this patient?  Check the care team in [facility] and look for a) attending/consulting [facility] provider listed and b) the [facility] team listed Log into [email or web address] and use [place]'s universal password to access. If you do not have the password, please contact the hospital operator. Locate the [facility] provider you are looking for under [facility] and page to a number that you can be directly reached. If you still have difficulty reaching the provider, please page the [facility] (Director on Call) for the Hospitalists listed on amion for assistance.  04/16/2022, 8:36 AM

## 2022-04-16 NOTE — Evaluation (Signed)
Physical Therapy Evaluation Patient Details Name: [name] MRN: [id number] DOB: May 16, 1941 Today's Date: 04/16/2022  History of Present Illness  [name]  is a 81 y.o. male with past medical history relevant for h/o bladder cancer status post cystectomy/ileal conduit, recurrent urinary tract infections with MDR, hypothyroidism, BPH and GERD who presents to the ED with fevers nausea, vomiting and malaise  -Patient was initially seen in the ED on 04/10/2022 treated and released for presumed UTI  -Return to the ED on 04/11/2022 with worsening symptoms and persistent fevers, as well as flank pain/lower back pain and foul-smelling urine  -CT abdomen and pelvis shows mild left hydro ureteral nephrosis with extensive urothelial hyperenhancement involving the left ureter until this stent in the left renal pelvis, moderate bilateral pleural effusions, possible lytic lesion in the right inferior pubic rami-similar to prior studies suggestive of osseous metastases   Clinical Impression  Patient functioning at baseline for functional mobility and gait demonstrating good return for bed mobility, transfers and ambulation in room/hallway without loss of balance.  Plan:  Patient discharged from physical therapy to care of nursing for ambulation daily as tolerated for length of stay.         Recommendations for follow up therapy are one component of a multi-disciplinary discharge planning process, led by the attending physician.  Recommendations may be updated based on patient status, additional functional criteria and insurance authorization.  Follow Up Recommendations No PT follow up      Assistance Recommended at Discharge    Patient can return home with the following  Other (comment) (patient at baseline)    Equipment Recommendations None recommended by PT  Recommendations for Other Services       Functional Status Assessment Patient has not had a recent decline in their functional status      Precautions / Restrictions Precautions Precautions: None Restrictions Weight Bearing Restrictions: No      Mobility  Bed Mobility Overal bed mobility: Independent                  Transfers Overall transfer level: Independent                      Ambulation/Gait Ambulation/Gait assistance: Modified independent (Device/Increase time) Gait Distance (Feet): 120 Feet Assistive device: None Gait Pattern/deviations: WFL(Within Functional Limits) Gait velocity: near normal     General Gait Details: demonstrates good return for ambulation in room and hallways without loss of balance  Stairs            Wheelchair Mobility    Modified Rankin (Stroke Patients Only)       Balance Overall balance assessment: No apparent balance deficits (not formally assessed)                                           Pertinent Vitals/Pain Pain Assessment Pain Assessment: 0-10 Pain Score: 4  Pain Intervention(s): Limited activity within patient's tolerance, Monitored during session    Home Living Family/patient expects to be discharged to:: Private residence Living Arrangements: Spouse/significant other Available Help at Discharge: Family;Available 24 hours/day Type of Home: House Home Access: Stairs to enter Entrance Stairs-Rails: None Entrance Stairs-Number of Steps: 1 Alternate Level Stairs-Number of Steps: 8 Home Layout: Two level Home Equipment: Grab bars - tub/shower;Rolling Walker (2 wheels);Cane - single point;Shower seat      Prior Function Prior  Level of Function : Independent/Modified Independent             Mobility Comments: community ambulator without AD, drives, shops ADLs Comments: independent     Hand Dominance   Dominant Hand: Right    Extremity/Trunk Assessment   Upper Extremity Assessment Upper Extremity Assessment: Overall WFL for tasks assessed    Lower Extremity Assessment Lower Extremity Assessment:  Overall WFL for tasks assessed    Cervical / Trunk Assessment Cervical / Trunk Assessment: Normal  Communication   Communication: No difficulties  Cognition Arousal/Alertness: Awake/alert Behavior During Therapy: WFL for tasks assessed/performed Overall Cognitive Status: Within Functional Limits for tasks assessed                                          General Comments      Exercises     Assessment/Plan    PT Assessment Patient does not need any further PT services  PT Problem List         PT Treatment Interventions      PT Goals (Current goals can be found in the Care Plan section)  Acute Rehab PT Goals Patient Stated Goal: return home PT Goal Formulation: With patient Time For Goal Achievement: 04/16/22 Potential to Achieve Goals: Good    Frequency       Co-evaluation               AM-PAC PT "6 Clicks" Mobility  Outcome Measure Help needed turning from your back to your side while in a flat bed without using bedrails?: None Help needed moving from lying on your back to sitting on the side of a flat bed without using bedrails?: None Help needed moving to and from a bed to a chair (including a wheelchair)?: None Help needed standing up from a chair using your arms (e.g., wheelchair or bedside chair)?: None Help needed to walk in hospital room?: None Help needed climbing 3-5 steps with a railing? : None 6 Click Score: 24    End of Session   Activity Tolerance: Patient tolerated treatment well Patient left: in chair;with call bell/phone within reach Nurse Communication: Mobility status PT Visit Diagnosis: Unsteadiness on feet (R26.81);Other abnormalities of gait and mobility (R26.89);Muscle weakness (generalized) (M62.81)    Time: 3299-2426 PT Time Calculation (min) (ACUTE ONLY): 20 min   Charges:   PT Evaluation $PT Eval Moderate Complexity: 1 Mod PT Treatments $Therapeutic Activity: 8-22 mins        1:48 PM,  04/16/22 Lonell Grandchild, MPT Physical Therapist with [facility] 336 [phone] office [phone] mobile phone

## 2022-04-17 DIAGNOSIS — A419 Sepsis, unspecified organism: Secondary | ICD-10-CM | POA: Diagnosis not present

## 2022-04-17 DIAGNOSIS — I480 Paroxysmal atrial fibrillation: Secondary | ICD-10-CM | POA: Diagnosis not present

## 2022-04-17 DIAGNOSIS — C679 Malignant neoplasm of bladder, unspecified: Secondary | ICD-10-CM | POA: Diagnosis not present

## 2022-04-17 DIAGNOSIS — J449 Chronic obstructive pulmonary disease, unspecified: Secondary | ICD-10-CM | POA: Diagnosis not present

## 2022-04-17 MED ORDER — METOPROLOL TARTRATE 25 MG PO TABS: 25.0000 mg | ORAL_TABLET | Freq: Two times a day (BID) | ORAL | 2 refills | Status: DC

## 2022-04-17 MED ORDER — PANTOPRAZOLE SODIUM 40 MG PO TBEC: 40.0000 mg | DELAYED_RELEASE_TABLET | Freq: Every day | ORAL | 1 refills | Status: DC

## 2022-04-17 MED ORDER — APIXABAN 5 MG PO TABS: 5.0000 mg | ORAL_TABLET | Freq: Two times a day (BID) | ORAL | 2 refills | Status: DC

## 2022-04-17 MED ORDER — HYDROCORTISONE 1 % EX CREA: TOPICAL_CREAM | Freq: Four times a day (QID) | CUTANEOUS | Status: DC | PRN

## 2022-04-17 NOTE — Discharge Summary (Signed)
Physician Discharge Summary  TAEVYN HAUSEN NID:[id number] DOB: 04-Jul-1941 DOA: 04/11/2022  PCP: [name], MD Urologist: Dr. Alyson Ingles   Admit date: 04/11/2022 Discharge date: 04/17/2022  Admitted From:  Home  Disposition: Home   Recommendations for Outpatient Follow-up:  Follow up with PCP in 1 weeks Please establish care with cardiology in 3-4 weeks Please follow up with Dr. Alyson Ingles in 1-2 weeks  Please check CBC / BMP in 1-2 weeks   Discharge Condition: STABLE   CODE STATUS: FULL DIET: resume prior home diet  Brief Hospitalization Summary: Please see all hospital notes, images, labs for full details of the hospitalization. ADMISSION HPI:  81 y.o. male with past medical history relevant for h/o bladder cancer status post cystectomy/ileal conduit, recurrent urinary tract infections with MDR, hypothyroidism, BPH and GERD who presents to the ED with fevers nausea, vomiting and malaise -Patient was initially seen in the ED on 04/10/2022 treated and released for presumed UTI -Return to the ED on 04/11/2022 with worsening symptoms and persistent fevers, as well as flank pain/lower back pain and foul-smelling urine -CT abdomen and pelvis shows mild left hydro ureteral nephrosis with extensive urothelial hyperenhancement involving the left ureter until this stent in the left renal pelvis, moderate bilateral pleural effusions, possible lytic lesion in the right inferior pubic rami-similar to prior studies suggestive of osseous metastases --Chest x-ray  with chronic right apical changes and bilateral nares pleural effusions, no definite pneumonia -Lactic acid is down to 2.3 from 3.1 after IV fluids -UA suggestive for UTI urine culture pending -WBC is up to 23.6 from 15.6 within a 6-hour interval -Creatinine 0.94, LFTs are not elevated -Patient with recent history of C. difficile infection in May 2023  HOSPITAL COURSE BY PROBLEM   Pseudomonas sepsis secondary to presumed urinary  source--POA -patient met sepsis criteria on admission with with persistent fevers (up to 103.3), Tachycardia, tachypnea and leukocytosis (-WBC is up to 23.6 from 15.6 within a 6-hour interval) -Patient with history of recurrent UTIs from MDR organisms in the setting of history of bladder cancer status post prior cystectomy with ileal conduit --Lactic acid is down to 2.3 from 3.1 after IV fluids -Urine culture from 04/11/2022 with Pseudomonas aeruginosa -Blood cultures NGTD -Urine culture from 03/17/2022 with MDR Klebsiella pneumoniae -Urine culture from 03/03/2022 with MD Pseudomonas -Initially treated with IV IV ertapenem , okay to de-escalate to cefepime -Received IV fluids per sepsis protocol WBC 15.6 >>23.6>>11.8  Pseudomonas sepsis due to Urinary Source--h/o bladder ca -Treated with IV cefepime thru 04/17/22 full 5 day course.  DC vanc as MRSA screen negative - Pt is clinically improved and feeling great, wanting to go home - follow up with his urologist Dr. Alyson Ingles in 1-2 weeks    2) History of stage IV bladder cancer with metastatic to the lung and bones -Patient's stage IV high-grade urothelial carcinoma was originally diagnosed 2018 with pulmonary and bone involvement. T3bN0 disease. This tumor found to have PDL 1 positive  with CPS score over 90%.  -Patient follows with Dr. Sol Passer -Patient previously had  laparoscopic Cystoprostatectomy and bilateral lymphadenectomy done by Dr. Tresa Moore on 06/29/2017 -He completed radiation therapy to the pelvis on January 16, 2018 under the care of Dr. Tammi Klippel. He is status post radiation therapy to the right paratracheal lymph node for a total of 50 Gray in 10 fractions completed on April 23, 2020.  He is status post definitive radiation to intrathoracic lymph node completed on November 27, 2021.  He received 40 Gray in  10 fractions. 04/11/22---CT abdomen and pelvis shows mild left hydro ureteral nephrosis with extensive urothelial  hyperenhancement involving the left ureter until this stent in the left renal pelvis, moderate bilateral pleural effusions, possible lytic lesion in the right inferior pubic rami-similar to prior studies suggestive of osseous metastases   3)Hypothyroidism--- TSH WNL at 1.34 -Continue levothyroxine   4)COPD--- patient presented with some wheezing -Wheezing resolved with bronchodilators -Hold off on steroids at this time -Chest x-ray without pneumonia -No hypoxia, continue bronchodilators   5) acute anemia--suspect related to hemodilution in setting of IV fluids per sepsis protocol ---no evidence of ongoing bleeding -  Hg stable    6) Newly Diagnosed Paroxysmal Atrial Fibrillation - picked up on telemetry monitoring in hospital, confirmed by EKG - CHAD2VASC score of at least 2  - metoprolol 25 mg BID - apixaban 5 mg BID, counseled by pharm D and given discount card - TTE below was reassuring - ambulatory referral to establish care with cardiology made prior to discharge    7) hypokalemia - oral replacement given and repleted - magnesium WNL, replete as needed      Discharge Diagnoses:  Principal Problem:   Sepsis secondary to urinary source Active Problems:   COPD (chronic obstructive pulmonary disease) (HCC)   Stage IV malignant neoplasm of urinary bladder /with mets to the bones and lungs   Status post ileal conduit due to bladder cancer   Port-A-Cath in place   Paroxysmal atrial fibrillation [facility])  Discharge Instructions: Discharge Instructions     Ambulatory referral to Cardiology   Complete by: As directed    Ambulatory referral to Urology   Complete by: As directed    Hospital follow up 1-2 weeks      Allergies as of 04/17/2022       Reactions   Ciprofloxacin Hives   Zofran [ondansetron] Rash        Medication List     STOP taking these medications    cefdinir 300 MG capsule Commonly known as: OMNICEF   fosfomycin 3 g Pack Commonly known as:  MONUROL   oxyCODONE 5 MG immediate release tablet Commonly known as: Oxy IR/ROXICODONE   predniSONE 10 MG tablet Commonly known as: DELTASONE       TAKE these medications    acetaminophen 500 MG tablet Commonly known as: TYLENOL Take 1,000 mg by mouth every 6 (six) hours as needed.   apixaban 5 MG Tabs tablet Commonly known as: ELIQUIS Take 1 tablet (5 mg total) by mouth 2 (two) times daily.   clotrimazole-betamethasone cream Commonly known as: LOTRISONE Apply 1 application topically in the morning and at bedtime.   HYDROmorphone 4 MG tablet Commonly known as: DILAUDID Take 1 tablet (4 mg total) by mouth every 4 (four) hours as needed for severe pain.   lactose free nutrition Liqd Take 237 mLs by mouth 3 (three) times daily between meals.   levothyroxine 50 MCG tablet Commonly known as: SYNTHROID Take 50 mcg by mouth daily.   metoprolol tartrate 25 MG tablet Commonly known as: LOPRESSOR Take 1 tablet (25 mg total) by mouth 2 (two) times daily.   pantoprazole 40 MG tablet Commonly known as: PROTONIX Take 1 tablet (40 mg total) by mouth daily.   polyethylene glycol 17 g packet Commonly known as: MIRALAX / GLYCOLAX Take 17 g by mouth daily as needed for mild constipation.        Follow-up Information     [name], MD. Schedule an appointment as soon  as possible for a visit in 1 week(s).   Specialty: Internal Medicine Why: Hospital Follow Up Contact information: [address] [place] [address] [phone]         [place] [facility] [place] [place]. Schedule an appointment as soon as possible for a visit in 2 week(s).   Specialty: Cardiology Why: Hospital Follow Up, establish care for new atrial fibrillation Contact information: [address] [id number] [place]        Cleon Gustin, MD. Schedule an appointment as soon as possible for a visit in 1  week(s).   Specialty: Urology Why: Hospital Follow Up Contact information: [address]  [place] [address] [phone]                Allergies  Allergen Reactions   Ciprofloxacin Hives   Zofran [Ondansetron] Rash   Allergies as of 04/17/2022       Reactions   Ciprofloxacin Hives   Zofran [ondansetron] Rash        Medication List     STOP taking these medications    cefdinir 300 MG capsule Commonly known as: OMNICEF   fosfomycin 3 g Pack Commonly known as: MONUROL   oxyCODONE 5 MG immediate release tablet Commonly known as: Oxy IR/ROXICODONE   predniSONE 10 MG tablet Commonly known as: DELTASONE       TAKE these medications    acetaminophen 500 MG tablet Commonly known as: TYLENOL Take 1,000 mg by mouth every 6 (six) hours as needed.   apixaban 5 MG Tabs tablet Commonly known as: ELIQUIS Take 1 tablet (5 mg total) by mouth 2 (two) times daily.   clotrimazole-betamethasone cream Commonly known as: LOTRISONE Apply 1 application topically in the morning and at bedtime.   HYDROmorphone 4 MG tablet Commonly known as: DILAUDID Take 1 tablet (4 mg total) by mouth every 4 (four) hours as needed for severe pain.   lactose free nutrition Liqd Take 237 mLs by mouth 3 (three) times daily between meals.   levothyroxine 50 MCG tablet Commonly known as: SYNTHROID Take 50 mcg by mouth daily.   metoprolol tartrate 25 MG tablet Commonly known as: LOPRESSOR Take 1 tablet (25 mg total) by mouth 2 (two) times daily.   pantoprazole 40 MG tablet Commonly known as: PROTONIX Take 1 tablet (40 mg total) by mouth daily.   polyethylene glycol 17 g packet Commonly known as: MIRALAX / GLYCOLAX Take 17 g by mouth daily as needed for mild constipation.        Procedures/Studies: ECHOCARDIOGRAM COMPLETE  Result Date: 04/15/2022    ECHOCARDIOGRAM REPORT   Patient Name:   LIBERTY STEAD Date of Exam: 04/15/2022 Medical Rec #:  [id number]       Height:       71.0 in Accession #:    [id number]     Weight:       201.2 lb Date of Birth:  11-18-1940      BSA:          2.114 m Patient Age:    17 years       BP:           128/68 mmHg Patient Gender: M              HR:           76 bpm. Exam Location:  Inpatient Procedure: 2D Echo Indications:  atrial fibrillation  History:        Patient has prior history of Echocardiogram examinations, most                 recent 04/14/2018. CAD; COPD and Cancer.  Sonographer:    Johny Chess RDCS Referring Phys: [place]  1. Left ventricular ejection fraction, by estimation, is 60 to 65%. The left ventricle has normal function. The left ventricle has no regional wall motion abnormalities. There is mild concentric left ventricular hypertrophy. Left ventricular diastolic parameters were normal.  2. Right ventricular systolic function is normal. The right ventricular size is normal. There is normal pulmonary artery systolic pressure. The estimated right ventricular systolic pressure is 00.3 mmHg.  3. The mitral valve is grossly normal. Trivial mitral valve regurgitation.  4. The aortic valve is tricuspid. There is moderate calcification of the aortic valve. Aortic valve regurgitation is not visualized. Mild aortic valve stenosis. Aortic valve area, by VTI measures 1.72 cm. Aortic valve mean gradient measures 8.0 mmHg.  5. The inferior vena cava is normal in size with greater than 50% respiratory variability, suggesting right atrial pressure of 3 mmHg. Comparison(s): Prior images unable to be directly viewed. FINDINGS  Left Ventricle: Left ventricular ejection fraction, by estimation, is 60 to 65%. The left ventricle has normal function. The left ventricle has no regional wall motion abnormalities. The left ventricular internal cavity size was normal in size. There is  mild concentric left ventricular hypertrophy. Left ventricular diastolic parameters were normal. Right Ventricle: The right  ventricular size is normal. No increase in right ventricular wall thickness. Right ventricular systolic function is normal. There is normal pulmonary artery systolic pressure. The tricuspid regurgitant velocity is 2.78 m/s, and  with an assumed right atrial pressure of 3 mmHg, the estimated right ventricular systolic pressure is 49.1 mmHg. Left Atrium: Left atrial size was normal in size. Right Atrium: Right atrial size was normal in size. Pericardium: There is no evidence of pericardial effusion. Presence of epicardial fat layer. Mitral Valve: The mitral valve is grossly normal. Trivial mitral valve regurgitation. Tricuspid Valve: The tricuspid valve is grossly normal. Tricuspid valve regurgitation is mild. Aortic Valve: The aortic valve is tricuspid. There is moderate calcification of the aortic valve. Aortic valve regurgitation is not visualized. Mild aortic stenosis is present. Aortic valve mean gradient measures 8.0 mmHg. Aortic valve peak gradient measures 16.2 mmHg. Aortic valve area, by VTI measures 1.72 cm. Pulmonic Valve: The pulmonic valve was not well visualized. Pulmonic valve regurgitation is trivial. Aorta: The aortic root is normal in size and structure. Venous: The inferior vena cava is normal in size with greater than 50% respiratory variability, suggesting right atrial pressure of 3 mmHg. IAS/Shunts: No atrial level shunt detected by color flow Doppler.  LEFT VENTRICLE PLAX 2D LVIDd:         4.30 cm   Diastology LVIDs:         2.80 cm   LV e' medial:    8.49 cm/s LV PW:         1.00 cm   LV E/e' medial:  9.7 LV IVS:        1.10 cm   LV e' lateral:   8.49 cm/s LVOT diam:     2.00 cm   LV E/e' lateral: 9.7 LV SV:         63 LV SV Index:   30 LVOT Area:     3.14 cm  RIGHT VENTRICLE  IVC RV S prime:     12.60 cm/s  IVC diam: 1.90 cm TAPSE (M-mode): 2.3 cm LEFT ATRIUM             Index        RIGHT ATRIUM           Index LA diam:        3.40 cm 1.61 cm/m   RA Area:     12.30 cm LA Vol  (A2C):   54.2 ml 25.64 ml/m  RA Volume:   26.70 ml  12.63 ml/m LA Vol (A4C):   36.4 ml 17.22 ml/m LA Biplane Vol: 44.4 ml 21.00 ml/m  AORTIC VALVE                     PULMONIC VALVE AV Area (Vmax):    1.58 cm      PV Vmax:       1.92 m/s AV Area (Vmean):   1.61 cm      PV Vmean:      122.000 cm/s AV Area (VTI):     1.72 cm      PV VTI:        0.355 m AV Vmax:           201.00 cm/s   PV Peak grad:  14.7 mmHg AV Vmean:          133.000 cm/s  PV Mean grad:  7.0 mmHg AV VTI:            0.368 m AV Peak Grad:      16.2 mmHg AV Mean Grad:      8.0 mmHg LVOT Vmax:         101.00 cm/s LVOT Vmean:        68.100 cm/s LVOT VTI:          0.202 m LVOT/AV VTI ratio: 0.55  AORTA Ao Root diam: 3.10 cm Ao Asc diam:  3.60 cm MITRAL VALVE               TRICUSPID VALVE MV Area (PHT): 3.91 cm    TR Peak grad:   30.9 mmHg MV Decel Time: 194 msec    TR Vmax:        278.00 cm/s MV E velocity: 82.70 cm/s MV A velocity: 98.10 cm/s  SHUNTS MV E/A ratio:  0.84        Systemic VTI:  0.20 m                            Systemic Diam: 2.00 cm Rozann Lesches MD Electronically signed by Rozann Lesches MD Signature Date/Time: 04/15/2022/3:27:54 PM    Final    DG Chest 2 View  Result Date: 04/12/2022 CLINICAL DATA:  Dyspnea EXAM: CHEST - 2 VIEW COMPARISON:  Yesterday FINDINGS: Artifact from EKG leads. Chronic right apical opacity which is indistinct and partially obscured. There is no edema, consolidation, or pneumothorax. Hazy density at the bases from pleural fluid by recent abdominal CT. Normal heart size and stable mediastinal contours. IMPRESSION: Small pleural effusions.  No interval abnormality. Electronically Signed   By: Jorje Guild M.D.   On: 04/12/2022 06:14   DG Chest Port 1 View  Result Date: 04/11/2022 CLINICAL DATA:  Weakness and vomiting. High fever. Questionable sepsis. Evaluate for abnormality. History of metastatic urothelial carcinoma. EXAM: PORTABLE CHEST 1 VIEW COMPARISON:  Chest radiograph 12/07/2021 and  PET-CT 03/15/2022 and CT abdomen 04/11/2022 FINDINGS:  Haziness at the lung bases with blunting of the costophrenic angles. Findings compatible with known bilateral pleural effusions from recent abdominal CT. Chronic parenchymal densities at the right lung apex. Heart and mediastinum are within normal limits and stable. Negative for a pneumothorax. No acute bone abnormality. IMPRESSION: Haziness and blunting at the lung bases. Findings compatible with known bilateral pleural effusions. Chronic changes at the right lung apex. Electronically Signed   By: Markus Daft M.D.   On: 04/11/2022 11:36   CT ABDOMEN PELVIS W CONTRAST  Result Date: 04/11/2022 CLINICAL DATA:  81 year old male with history of acute onset of nonlocalized abdominal pain. Foul-smelling urine. Possible pyelonephritis. History of bladder cancer status post radical cystectomy and ileal conduit formation. * Tracking Code: BO * EXAM: CT ABDOMEN AND PELVIS WITH CONTRAST TECHNIQUE: Multidetector CT imaging of the abdomen and pelvis was performed using the standard protocol following bolus administration of intravenous contrast. RADIATION DOSE REDUCTION: This exam was performed according to the departmental dose-optimization program which includes automated exposure control, adjustment of the mA and/or kV according to patient size and/or use of iterative reconstruction technique. CONTRAST:  15m OMNIPAQUE IOHEXOL 300 MG/ML  SOLN COMPARISON:  CT of the abdomen and pelvis 12/25/2021. PET-CT 03/15/2022. FINDINGS: Lower chest: Calcifications of the aortic valve. Atherosclerotic calcifications in the descending thoracic aorta as well as the distal right coronary artery. Moderate bilateral pleural effusions lying dependently. Small hiatal hernia. Hepatobiliary: Multiple low-attenuation hepatic lesions, largest of which are compatible with simple cysts, measuring up to 2.7 x 2.2 cm in segment 6 of the liver (axial image 37 of series 2). Other subcentimeter  low-attenuation hepatic lesions are too small to definitively characterize, but statistically likely to represent tiny cysts or biliary hamartomas (no imaging follow-up is recommended). The appearance of the gallbladder is normal. Pancreas: No pancreatic mass. No pancreatic ductal dilatation. No pancreatic or peripancreatic fluid collections or inflammatory changes. Spleen: Unremarkable. Adrenals/Urinary Tract: Status radical cystoprostatectomy with right lower quadrant ileostomy. Left-sided hydroureteronephrosis which extends all the way to the level of the ileal conduit. Urothelial hyperenhancement throughout the left ureter, and within the left renal collecting system. Enhancement pattern of the left kidney is grossly unremarkable. Specifically, no overt hypovascular areas to clearly indicate acute pyelonephritis at this time. Subcentimeter low-attenuation lesion in the upper pole of the right kidney, too small to characterize, but statistically likely tiny cysts (no imaging follow-up is recommended). No right hydroureteronephrosis. Bilateral adrenal glands are normal in appearance. Stomach/Bowel: Intra-abdominal portion of the stomach is unremarkable. No pathologic dilatation of small bowel or colon. The appendix is not confidently identified and may be surgically absent. Regardless, there are no inflammatory changes noted adjacent to the cecum to suggest the presence of an acute appendicitis at this time. Vascular/Lymphatic: Atherosclerotic calcifications throughout the abdominal and pelvic vasculature, without definite aneurysm or dissection. No lymphadenopathy noted in the abdomen or pelvis. Reproductive: Status post radical cystoprostatectomy. Other: No significant volume of ascites.  No pneumoperitoneum. Musculoskeletal: Expansile lytic lesion in the right inferior pubic ramus extending into the overlying soft tissues estimated to measure approximately 4.5 x 5.3 cm (axial image 92 of series 2), similar to  prior studies. No other new suspicious appearing lytic or blastic lesions are noted elsewhere in the visualized portions of the skeleton. IMPRESSION: 1. Mild left hydroureteronephrosis with extensive urothelial hyperenhancement involving the left ureter and to a lesser extent the left renal pelvis. Correlation with urinalysis is recommended. No overt imaging changes to clearly indicate pyelonephritis at this time. 2.  Moderate bilateral pleural effusions lying dependently. 3. Expansile lytic lesion in the right inferior pubic ramus, similar to prior studies, likely to represent an osseous metastasis. 4. Aortic atherosclerosis. 5. Additional postoperative changes and incidental findings, as above. Electronically Signed   By: Vinnie Langton M.D.   On: 04/11/2022 07:48     Subjective: Pt reports he feels well and he really wants to go home today. No fever or chills.    Discharge Exam: Vitals:   04/17/22 0525 04/17/22 0914  BP: 117/68 127/79  Pulse: 82 85  Resp: 18   Temp: 97.8 F (36.6 C)   SpO2: 93%    Vitals:   04/16/22 2120 04/16/22 2256 04/17/22 0525 04/17/22 0914  BP: 117/73 104/73 117/68 127/79  Pulse: 79 76 82 85  Resp:  18 18   Temp:  98.4 F (36.9 C) 97.8 F (36.6 C)   TempSrc:  Oral Oral   SpO2:  94% 93%   Weight:      Height:       General: Pt is alert, awake, not in acute distress Cardiovascular: normal S1/S2 +, no rubs, no gallops Respiratory: CTA bilaterally, no wheezing, no rhonchi Abdominal: Soft, NT, ND, bowel sounds + Extremities: no edema, no cyanosis   The results of significant diagnostics from this hospitalization (including imaging, microbiology, ancillary and laboratory) are listed below for reference.     Microbiology: Recent Results (from the past 240 hour(s))  Resp Panel by RT-PCR (Flu A&B, Covid) Anterior Nasal Swab     Status: None   Collection Time: 04/11/22 11:13 AM   Specimen: Anterior Nasal Swab  Result Value Ref Range Status   SARS  Coronavirus 2 by RT PCR NEGATIVE NEGATIVE Final    Comment: (NOTE) SARS-CoV-2 target nucleic acids are NOT DETECTED.  The SARS-CoV-2 RNA is generally detectable in upper respiratory specimens during the acute phase of infection. The lowest concentration of SARS-CoV-2 viral copies this assay can detect is 138 copies/mL. A negative result does not preclude SARS-Cov-2 infection and should not be used as the sole basis for treatment or other patient management decisions. A negative result may occur with  improper specimen collection/handling, submission of specimen other than nasopharyngeal swab, presence of viral mutation(s) within the areas targeted by this assay, and inadequate number of viral copies(<138 copies/mL). A negative result must be combined with clinical observations, patient history, and epidemiological information. The expected result is Negative.  Fact Sheet for Patients:  EntrepreneurPulse.com.au  Fact Sheet for Healthcare Providers:  IncredibleEmployment.be  This test is no t yet approved or cleared by the [place] FDA and  has been authorized for detection and/or diagnosis of SARS-CoV-2 by FDA under an Emergency Use Authorization (EUA). This EUA will remain  in effect (meaning this test can be used) for the duration of the COVID-19 declaration under Section 564(b)(1) of the Act, 21 U.S.C.section 360bbb-3(b)(1), unless the authorization is terminated  or revoked sooner.       Influenza A by PCR NEGATIVE NEGATIVE Final   Influenza B by PCR NEGATIVE NEGATIVE Final    Comment: (NOTE) The Xpert Xpress SARS-CoV-2/FLU/RSV plus assay is intended as an aid in the diagnosis of influenza from Nasopharyngeal swab specimens and should not be used as a sole basis for treatment. Nasal washings and aspirates are unacceptable for Xpert Xpress SARS-CoV-2/FLU/RSV testing.  Fact Sheet for  Patients: EntrepreneurPulse.com.au  Fact Sheet for Healthcare Providers: IncredibleEmployment.be  This test is not yet approved or cleared by the [place] FDA  and has been authorized for detection and/or diagnosis of SARS-CoV-2 by FDA under an Emergency Use Authorization (EUA). This EUA will remain in effect (meaning this test can be used) for the duration of the COVID-19 declaration under Section 564(b)(1) of the Act, 21 U.S.C. section 360bbb-3(b)(1), unless the authorization is terminated or revoked.  Performed at [facility], [address] [place], [place] [address]   Culture, blood (routine x 2)     Status: None   Collection Time: 04/11/22 11:38 AM   Specimen: BLOOD  Result Value Ref Range Status   Specimen Description BLOOD LEFT ANTECUBITAL  Final   Special Requests   Final    Blood Culture adequate volume BOTTLES DRAWN AEROBIC AND ANAEROBIC   Culture   Final    NO GROWTH 5 DAYS Performed at [facility], [address], [place], [place] [address]    Report Status 04/16/2022 FINAL  Final  Culture, blood (routine x 2)     Status: None   Collection Time: 04/11/22 11:46 AM   Specimen: BLOOD  Result Value Ref Range Status   Specimen Description BLOOD BLOOD RIGHT HAND  Final   Special Requests   Final    BOTTLES DRAWN AEROBIC AND ANAEROBIC Blood Culture adequate volume   Culture   Final    NO GROWTH 5 DAYS Performed at [facility], [address], [place], [place] [address]    Report Status 04/16/2022 FINAL  Final  Urine Culture     Status: Abnormal   Collection Time: 04/11/22 11:59 AM   Specimen: Urine, Clean Catch  Result Value Ref Range Status   Specimen Description   Final    URINE, CLEAN CATCH Performed at [facility], [address] [place], [place] [address]    Special Requests   Final    NONE Performed at [facility], [address], [place],  [address]    Culture 20,000 COLONIES/mL PSEUDOMONAS AERUGINOSA (A)   Final   Report Status 04/13/2022 FINAL  Final   Organism ID, Bacteria PSEUDOMONAS AERUGINOSA (A)  Final      Susceptibility   Pseudomonas aeruginosa - MIC*    CEFTAZIDIME 8 SENSITIVE Sensitive     CIPROFLOXACIN 1 INTERMEDIATE Intermediate     GENTAMICIN <=1 SENSITIVE Sensitive     IMIPENEM 2 SENSITIVE Sensitive     PIP/TAZO 16 SENSITIVE Sensitive     CEFEPIME 4 SENSITIVE Sensitive     * 20,000 COLONIES/mL PSEUDOMONAS AERUGINOSA     Labs: BNP (last 3 results) No results for input(s): "BNP" in the last 8760 hours. Basic Metabolic Panel: Recent Labs  Lab 04/11/22 0535 04/11/22 1113 04/12/22 0507 04/14/22 0227 04/15/22 0448  NA 140 138 140 141 141  K 3.9 3.7 3.6 3.1* 3.5  CL 107 105 109 110 109  CO2 '26 24 27 26 28  ' GLUCOSE 124* 156* 100* 88 96  BUN '14 12 11 9 10  ' CREATININE 0.95 0.94 0.83 0.93 0.94  CALCIUM 8.6* 8.6* 8.0* 8.2* 8.1*  MG  --   --   --  2.2 2.3   Liver Function Tests: Recent Labs  Lab 04/11/22 0535 04/11/22 1113 04/12/22 0507  AST 16 20 11*  ALT '19 21 13  ' ALKPHOS 62 59 39  BILITOT 1.2 0.9 0.8  PROT 6.5 6.4* 4.8*  ALBUMIN 3.3* 3.2* 2.3*   No results for input(s): "LIPASE", "AMYLASE" in the last 168 hours. No results for input(s): "AMMONIA" in the last 168 hours. CBC: Recent Labs  Lab 04/11/22  3149 04/11/22 1113 04/12/22 0507 04/14/22 0227  WBC 15.6* 23.6* 11.8* 6.3  NEUTROABS 13.6* 21.2*  --   --   HGB 15.0 14.5 11.2* 11.6*  HCT 44.5 43.3 34.5* 35.6*  MCV 105.5* 105.4* 107.5* 107.9*  PLT 185 200 126* 142*   Cardiac Enzymes: No results for input(s): "CKTOTAL", "CKMB", "CKMBINDEX", "TROPONINI" in the last 168 hours. BNP: Invalid input(s): "POCBNP" CBG: Recent Labs  Lab 04/14/22 0729  GLUCAP 95   D-Dimer No results for input(s): "DDIMER" in the last 72 hours. Hgb A1c No results for input(s): "HGBA1C" in the last 72 hours. Lipid Profile No results for input(s): "CHOL", "HDL", "LDLCALC", "TRIG", "CHOLHDL", "LDLDIRECT" in the last 72  hours. Thyroid function studies No results for input(s): "TSH", "T4TOTAL", "T3FREE", "THYROIDAB" in the last 72 hours.  Invalid input(s): "FREET3" Anemia work up No results for input(s): "VITAMINB12", "FOLATE", "FERRITIN", "TIBC", "IRON", "RETICCTPCT" in the last 72 hours. Urinalysis    Component Value Date/Time   COLORURINE YELLOW 04/11/2022 1204   APPEARANCEUR CLOUDY (A) 04/11/2022 1204   APPEARANCEUR Hazy (A) 03/17/2022 1608   LABSPEC 1.032 (H) 04/11/2022 1204   PHURINE 6.0 04/11/2022 1204   GLUCOSEU NEGATIVE 04/11/2022 1204   HGBUR MODERATE (A) 04/11/2022 1204   BILIRUBINUR NEGATIVE 04/11/2022 1204   BILIRUBINUR Negative 03/17/2022 1608   KETONESUR NEGATIVE 04/11/2022 1204   PROTEINUR 30 (A) 04/11/2022 1204   NITRITE NEGATIVE 04/11/2022 1204   LEUKOCYTESUR MODERATE (A) 04/11/2022 1204   Sepsis Labs Recent Labs  Lab 04/11/22 0535 04/11/22 1113 04/12/22 0507 04/14/22 0227  WBC 15.6* 23.6* 11.8* 6.3   Microbiology Recent Results (from the past 240 hour(s))  Resp Panel by RT-PCR (Flu A&B, Covid) Anterior Nasal Swab     Status: None   Collection Time: 04/11/22 11:13 AM   Specimen: Anterior Nasal Swab  Result Value Ref Range Status   SARS Coronavirus 2 by RT PCR NEGATIVE NEGATIVE Final    Comment: (NOTE) SARS-CoV-2 target nucleic acids are NOT DETECTED.  The SARS-CoV-2 RNA is generally detectable in upper respiratory specimens during the acute phase of infection. The lowest concentration of SARS-CoV-2 viral copies this assay can detect is 138 copies/mL. A negative result does not preclude SARS-Cov-2 infection and should not be used as the sole basis for treatment or other patient management decisions. A negative result may occur with  improper specimen collection/handling, submission of specimen other than nasopharyngeal swab, presence of viral mutation(s) within the areas targeted by this assay, and inadequate number of viral copies(<138 copies/mL). A negative  result must be combined with clinical observations, patient history, and epidemiological information. The expected result is Negative.  Fact Sheet for Patients:  EntrepreneurPulse.com.au  Fact Sheet for Healthcare Providers:  IncredibleEmployment.be  This test is no t yet approved or cleared by the [place] FDA and  has been authorized for detection and/or diagnosis of SARS-CoV-2 by FDA under an Emergency Use Authorization (EUA). This EUA will remain  in effect (meaning this test can be used) for the duration of the COVID-19 declaration under Section 564(b)(1) of the Act, 21 U.S.C.section 360bbb-3(b)(1), unless the authorization is terminated  or revoked sooner.       Influenza A by PCR NEGATIVE NEGATIVE Final   Influenza B by PCR NEGATIVE NEGATIVE Final    Comment: (NOTE) The Xpert Xpress SARS-CoV-2/FLU/RSV plus assay is intended as an aid in the diagnosis of influenza from Nasopharyngeal swab specimens and should not be used as a sole basis for treatment. Nasal washings and aspirates  are unacceptable for Xpert Xpress SARS-CoV-2/FLU/RSV testing.  Fact Sheet for Patients: EntrepreneurPulse.com.au  Fact Sheet for Healthcare Providers: IncredibleEmployment.be  This test is not yet approved or cleared by the [place] FDA and has been authorized for detection and/or diagnosis of SARS-CoV-2 by FDA under an Emergency Use Authorization (EUA). This EUA will remain in effect (meaning this test can be used) for the duration of the COVID-19 declaration under Section 564(b)(1) of the Act, 21 U.S.C. section 360bbb-3(b)(1), unless the authorization is terminated or revoked.  Performed at [facility], [address] [place], [place] [address]   Culture, blood (routine x 2)     Status: None   Collection Time: 04/11/22 11:38 AM   Specimen: BLOOD  Result Value Ref Range Status   Specimen Description BLOOD  LEFT ANTECUBITAL  Final   Special Requests   Final    Blood Culture adequate volume BOTTLES DRAWN AEROBIC AND ANAEROBIC   Culture   Final    NO GROWTH 5 DAYS Performed at [facility], [address], [place], [place] [address]    Report Status 04/16/2022 FINAL  Final  Culture, blood (routine x 2)     Status: None   Collection Time: 04/11/22 11:46 AM   Specimen: BLOOD  Result Value Ref Range Status   Specimen Description BLOOD BLOOD RIGHT HAND  Final   Special Requests   Final    BOTTLES DRAWN AEROBIC AND ANAEROBIC Blood Culture adequate volume   Culture   Final    NO GROWTH 5 DAYS Performed at [facility], [address] [place], [place] [address]    Report Status 04/16/2022 FINAL  Final  Urine Culture     Status: Abnormal   Collection Time: 04/11/22 11:59 AM   Specimen: Urine, Clean Catch  Result Value Ref Range Status   Specimen Description   Final    URINE, CLEAN CATCH Performed at [facility], [address] [place], [place] [address]    Special Requests   Final    NONE Performed at [facility], [address], [place], [place] [address]    Culture 20,000 COLONIES/mL PSEUDOMONAS AERUGINOSA (A)  Final   Report Status 04/13/2022 FINAL  Final   Organism ID, Bacteria PSEUDOMONAS AERUGINOSA (A)  Final      Susceptibility   Pseudomonas aeruginosa - MIC*    CEFTAZIDIME 8 SENSITIVE Sensitive     CIPROFLOXACIN 1 INTERMEDIATE Intermediate     GENTAMICIN <=1 SENSITIVE Sensitive     IMIPENEM 2 SENSITIVE Sensitive     PIP/TAZO 16 SENSITIVE Sensitive     CEFEPIME 4 SENSITIVE Sensitive     * 20,000 COLONIES/mL PSEUDOMONAS AERUGINOSA   Time coordinating discharge: 38 mins  SIGNED:  Irwin Brakeman, MD  [facility] 04/17/2022, 1:44 PM How to contact the [facility] Attending or Consulting provider 7A - 7P or covering provider during after hours [place], for this patient?  Check the care team in [facility] and look for a) attending/consulting TR[facility] provider listed and b) the [facility] team  listed Log into [email or web address] and use [place]'s universal password to access. If you do not have the password, please contact the hospital operator. Locate the [facility] provider you are looking for under [facility] and page to a number that you can be directly reached. If you still have difficulty reaching the provider, please page the [facility] (Director on Call) for the Hospitalists listed on amion for assistance.

## 2022-04-17 NOTE — Progress Notes (Signed)
Patient discharged home today, transported home by family. Discharge paperwork went over with patient and wife, both verbalized understanding. Belongings sent home with patient.  

## 2022-04-23 ENCOUNTER — Ambulatory Visit (INDEPENDENT_AMBULATORY_CARE_PROVIDER_SITE_OTHER): Payer: Medicare Other | Admitting: Urology

## 2022-04-23 VITALS — BP 110/72 | HR 86

## 2022-04-23 DIAGNOSIS — Z6827 Body mass index (BMI) 27.0-27.9, adult: Secondary | ICD-10-CM | POA: Diagnosis not present

## 2022-04-23 DIAGNOSIS — C679 Malignant neoplasm of bladder, unspecified: Secondary | ICD-10-CM | POA: Diagnosis not present

## 2022-04-23 DIAGNOSIS — J209 Acute bronchitis, unspecified: Secondary | ICD-10-CM | POA: Diagnosis not present

## 2022-04-23 DIAGNOSIS — N390 Urinary tract infection, site not specified: Secondary | ICD-10-CM | POA: Diagnosis not present

## 2022-04-23 DIAGNOSIS — R059 Cough, unspecified: Secondary | ICD-10-CM | POA: Diagnosis not present

## 2022-04-23 DIAGNOSIS — Z87440 Personal history of urinary (tract) infections: Secondary | ICD-10-CM

## 2022-04-23 DIAGNOSIS — I10 Essential (primary) hypertension: Secondary | ICD-10-CM | POA: Diagnosis not present

## 2022-04-23 DIAGNOSIS — J449 Chronic obstructive pulmonary disease, unspecified: Secondary | ICD-10-CM | POA: Diagnosis not present

## 2022-04-23 LAB — URINALYSIS, ROUTINE W REFLEX MICROSCOPIC
Bilirubin, UA: NEGATIVE
Glucose, UA: NEGATIVE
Ketones, UA: NEGATIVE
Nitrite, UA: NEGATIVE
Specific Gravity, UA: 1.02 (ref 1.005–1.030)
Urobilinogen, Ur: 0.2 mg/dL (ref 0.2–1.0)
pH, UA: 6.5 (ref 5.0–7.5)

## 2022-04-23 LAB — MICROSCOPIC EXAMINATION
RBC, Urine: >30 /hpf — AB (ref 0–2)
Renal Epithel, UA: NONE SEEN /hpf
WBC, UA: >30 /hpf — AB (ref 0–5)

## 2022-04-23 MED ORDER — CEFPODOXIME PROXETIL 200 MG PO TABS: 200.0000 mg | ORAL_TABLET | Freq: Two times a day (BID) | ORAL | 0 refills | Status: DC

## 2022-04-23 MED ORDER — METHENAMINE HIPPURATE 1 G PO TABS: 1.0000 g | ORAL_TABLET | Freq: Two times a day (BID) | ORAL | 3 refills | Status: DC

## 2022-04-23 NOTE — Progress Notes (Signed)
04/23/2022 10:48 AM   [name] 1941/05/28 [id number]  Referring provider: [name], MD [address] [place],  [place] [address]  Followup recurrent UTI   HPI: [name] is a 81yo here for followup after hospitalization. He was recently discharged after pyelonephritis. He has had 3 UTI since last visit while on amoxicillin prophylaxis. He denies any fevers currently. 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 s/p TURBT and intravesical BCG tx/  recurrent 07/2015 (pTa) TCC  repear intravesical BCG tx    History of hiatal hernia    History of urethral stricture    Nocturia     Surgical History: Past Surgical History:  Procedure Laterality Date   CARDIAC CATHETERIZATION  1997   normal coronary arteries   CARDIOVASCULAR STRESS TEST  12-14-2005   Low risk perfusion study/  very small scar in the inferior septum from mid ventricle to apex,  no ischemia/  mild inferior septal hypokinesis/  ef 58%   CATARACT EXTRACTION W/ INTRAOCULAR LENS  IMPLANT, BILATERAL  2011   COLONOSCOPY  last one 06/ 2018   CYSTOSCOPY WITH BIOPSY N/A 08/12/2015   Procedure: CYSTOSCOPY WITH BIOPSY AND FULGERATION;  Surgeon: [name], MD;  Location[facility];  Service: Urology;  Laterality: N/A;   CYSTOSCOPY WITH INJECTION N/A 06/29/2017   Procedure: CYSTOSCOPY WITH INJECTION OF INDOCYANINE GREEN DYE;  Surgeon: Alexis Frock, MD;  Location: [facility];  Service: Urology;  Laterality: N/A;   CYSTOSCOPY WITH URETHRAL DILATATION N/A 03/21/2017   Procedure: CYSTOSCOPY;  Surgeon: [name], MD;  Location: [facility];  Service: Urology;  Laterality: N/A;   ESOPHAGOGASTRODUODENOSCOPY  12-05-2014   IR IMAGING GUIDED PORT INSERTION  02/07/2018   IR NEPHROSTOMY PLACEMENT LEFT  12/25/2021   IR REMOVAL TUN ACCESS W/ PORT W/O FL MOD SED  04/14/2018   TRANSTHORACIC ECHOCARDIOGRAM  01-31-2008   nomral LVF,  ef 55-65%/  mild pulmonic stenosis without regurg.,  peak transpulomonic valve gradient 42mHg   TRANSURETHRAL RESECTION OF BLADDER TUMOR N/A 12/31/2014   Procedure: TRANSURETHRAL RESECTION OF BLADDER TUMOR (TURBT);  Surgeon: MLowella Bandy MD;  [facility]  Service: Urology;  Laterality: N/A;   TRANSURETHRAL RESECTION OF BLADDER TUMOR N/A 03/21/2017   Procedure: POSSIBLE TRANSURETHRAL RESECTION OF BLADDER TUMOR (TURBT);  Surgeon: BNickie Retort MD;  [facility]  Service: Urology;  Laterality: N/A;    Home Medications:  Allergies as of 04/23/2022       Reactions   Ciprofloxacin Hives   Zofran [ondansetron] Rash        Medication List        Accurate as of April 23, 2022 10:48 AM. If you have any questions, ask your nurse or doctor.          acetaminophen 500 MG tablet Commonly known as: TYLENOL Take 1,000 mg by mouth every 6 (six) hours as needed.   apixaban 5 MG Tabs tablet Commonly known as: ELIQUIS Take 1 tablet (5 mg total) by mouth 2 (two) times daily.  clotrimazole-betamethasone cream Commonly known as: LOTRISONE Apply 1 application topically in the morning and at bedtime.   HYDROmorphone 4 MG tablet Commonly known as: DILAUDID Take 1 tablet (4 mg total) by mouth every 4 (four) hours as needed for severe pain.   lactose free nutrition Liqd Take 237 mLs by mouth 3 (three) times daily between meals.   levothyroxine 50 MCG tablet Commonly known as: SYNTHROID Take 50 mcg by mouth daily.   metoprolol tartrate 25 MG tablet Commonly known as: LOPRESSOR Take 1 tablet (25 mg total) by mouth 2 (two) times daily.    pantoprazole 40 MG tablet Commonly known as: PROTONIX Take 1 tablet (40 mg total) by mouth daily.   polyethylene glycol 17 g packet Commonly known as: MIRALAX / GLYCOLAX Take 17 g by mouth daily as needed for mild constipation.        Allergies:  Allergies  Allergen Reactions   Ciprofloxacin Hives   Zofran [Ondansetron] Rash    Family History: Family History  Problem Relation Age of Onset   Alcoholism Father    Colon cancer Neg Hx    Colon polyps Neg Hx    Diabetes Neg Hx    Kidney disease Neg Hx    Esophageal cancer Neg Hx    Heart disease Neg Hx    Gallbladder disease Neg Hx    Allergic rhinitis Neg Hx    Angioedema Neg Hx    Asthma Neg Hx    Atopy Neg Hx    Eczema Neg Hx    Immunodeficiency Neg Hx    Urticaria Neg Hx     Social History:  reports that he quit smoking about 33 years ago. His smoking use included cigarettes. He has a 34.00 pack-year smoking history. He has never used smokeless tobacco. He reports current alcohol use of about 6.0 standard drinks of alcohol per week. He reports that he does not use drugs.  ROS: All other review of systems were reviewed and are negative except what is noted above in HPI  Physical Exam: BP 110/72   Pulse 86   Constitutional:  Alert and oriented, No acute distress. HEENT: [place] AT, moist mucus membranes.  Trachea midline, no masses. Cardiovascular: No clubbing, cyanosis, or edema. Respiratory: Normal respiratory effort, no increased work of breathing. GI: Abdomen is soft, nontender, nondistended, no abdominal masses GU: No CVA tenderness.  Lymph: No cervical or inguinal lymphadenopathy. Skin: No rashes, bruises or suspicious lesions. Neurologic: Grossly intact, no focal deficits, moving all 4 extremities. Psychiatric: Normal mood and affect.  Laboratory Data: Lab Results  Component Value Date   WBC 6.3 04/14/2022   HGB 11.6 (L) 04/14/2022   HCT 35.6 (L) 04/14/2022   MCV 107.9 (H) 04/14/2022   PLT 142 (L)  04/14/2022    Lab Results  Component Value Date   CREATININE 0.94 04/15/2022    No results found for: "PSA"  No results found for: "TESTOSTERONE"  Lab Results  Component Value Date   HGBA1C 5.1 06/16/2020    Urinalysis    Component Value Date/Time   COLORURINE YELLOW 04/11/2022 1204   APPEARANCEUR CLOUDY (A) 04/11/2022 1204   APPEARANCEUR Hazy (A) 03/17/2022 1608   LABSPEC 1.032 (H) 04/11/2022 1204   PHURINE 6.0 04/11/2022 1204   GLUCOSEU NEGATIVE 04/11/2022 1204   HGBUR MODERATE (A) 04/11/2022 1204   BILIRUBINUR NEGATIVE 04/11/2022 1204   BILIRUBINUR Negative 03/17/2022 [place] 04/11/2022 1204   PROTEINUR 30 (A) 04/11/2022 1204   NITRITE NEGATIVE 04/11/2022  [place] (A) 04/11/2022 1204    Lab Results  Component Value Date   LABMICR See below: 03/17/2022   WBCUA None seen 03/17/2022   LABEPIT 0-10 03/17/2022   MUCUS Present 03/17/2022   BACTERIA NONE SEEN 04/11/2022    Pertinent Imaging:  No results found for this or any previous visit.  Results for orders placed during the hospital encounter of 01/25/18  US Venous Img Lower Bilateral  Narrative CLINICAL DATA:  Bilateral pain and edema  EXAM: BILATERAL LOWER EXTREMITY VENOUS DOPPLER ULTRASOUND  TECHNIQUE: Gray-scale sonography with graded compression, as well as color Doppler and duplex ultrasound were performed to evaluate the lower extremity deep venous systems from the level of the common femoral vein and including the common femoral, femoral, profunda femoral, popliteal and calf veins including the posterior tibial, peroneal and gastrocnemius veins when visible. The superficial great saphenous vein was also interrogated. Spectral Doppler was utilized to evaluate flow at rest and with distal augmentation maneuvers in the common femoral, femoral and popliteal veins.  COMPARISON:  None.  FINDINGS: RIGHT LOWER EXTREMITY  Common Femoral Vein: No evidence of  thrombus. Normal compressibility, respiratory phasicity and response to augmentation.  Saphenofemoral Junction: No evidence of thrombus. Normal compressibility and flow on color Doppler imaging.  Profunda Femoral Vein: No evidence of thrombus. Normal compressibility and flow on color Doppler imaging.  Femoral Vein: No evidence of thrombus. Normal compressibility, respiratory phasicity and response to augmentation.  Popliteal Vein: No evidence of thrombus. Normal compressibility, respiratory phasicity and response to augmentation.  Calf Veins: No evidence of thrombus. Normal compressibility and flow on color Doppler imaging.  Superficial Great Saphenous Vein: No evidence of thrombus. Normal compressibility.  Venous Reflux:  None.  Other Findings:  None.  LEFT LOWER EXTREMITY  Common Femoral Vein: No evidence of thrombus. Normal compressibility, respiratory phasicity and response to augmentation.  Saphenofemoral Junction: No evidence of thrombus. Normal compressibility and flow on color Doppler imaging.  Profunda Femoral Vein: No evidence of thrombus. Normal compressibility and flow on color Doppler imaging.  Femoral Vein: No evidence of thrombus. Normal compressibility, respiratory phasicity and response to augmentation.  Popliteal Vein: No evidence of thrombus. Normal compressibility, respiratory phasicity and response to augmentation.  Calf Veins: No evidence of thrombus. Normal compressibility and flow on color Doppler imaging.  Superficial Great Saphenous Vein: No evidence of thrombus. Normal compressibility.  Venous Reflux:  None.  Other Findings:  None.  IMPRESSION: No evidence of deep venous thrombosis.   Electronically Signed By: Jerilynn Mages.  Shick M.D. On: 01/27/2018 11:02  No results found for this or any previous visit.  No results found for this or any previous visit.  Results for orders placed during the hospital encounter of 01/25/18  US  RENAL  Narrative CLINICAL DATA:  UTI, fever  EXAM: RENAL / URINARY TRACT ULTRASOUND COMPLETE  COMPARISON:  CT 11/25/2017  FINDINGS: Right Kidney:  Length: 11.3 cm. Normal echotexture. No hydronephrosis. 2.3 cm cyst in the midpole.  Left Kidney:  Length: 11.3 cm. Echogenicity within normal limits. No mass or hydronephrosis visualized.  Bladder:  Prior cystectomy.  IMPRESSION: No hydronephrosis or acute findings.   Electronically Signed By: Rolm Baptise M.D. On: 01/27/2018 10:56  No results found for this or any previous visit.  No results found for this or any previous visit.  Results for orders placed during the hospital encounter of 12/23/21  CT RENAL STONE STUDY  Narrative CLINICAL DATA:  History of bladder cancer with ileal conduit  creation now with concern for left-sided ureteral obstruction  Plan was to proceed with left-sided nephrostomy tube placement however sonographic evaluation demonstrated apparent resolution of previously noted left-sided pelvicaliectasis.  Please perform noncontrast CT scan the abdomen pelvis for further evaluation.  EXAM: CT ABDOMEN AND PELVIS WITHOUT CONTRAST  TECHNIQUE: Multidetector CT imaging of the abdomen and pelvis was performed following the standard protocol without IV contrast.  RADIATION DOSE REDUCTION: This exam was performed according to the departmental dose-optimization program which includes automated exposure control, adjustment of the mA and/or kV according to patient size and/or use of iterative reconstruction technique.  COMPARISON:  12/23/2021; 12/07/2021; 01/24/2019  FINDINGS: Lower chest: Limited visualization of the lower thorax redemonstrates trace bilateral effusions and associated bibasilar opacities, right slightly greater than left.  Normal heart size. Calcifications involving the mitral valve annulus, incompletely evaluated. No pericardial effusion.  Hepatobiliary: Normal hepatic  contour. Previously characterized hepatic cysts are incompletely characterized on present examination though morphologically similar to the 01/24/2019 examination.  Pancreas: Normal noncontrast appearance of the pancreas.  Spleen: Normal noncontrast appearance of the spleen. There is a punctate splenule about the anterior aspect of the spleen.  Adrenals/Urinary Tract: Previously noted left-sided caliectasis has resolved in the interval. Left-sided extrarenal pelvis is similar to remote examination performed 01/24/2019 while right-sided extrarenal pelvis is similar to the 12/07/2021 examination. No renal stones. No renal stones are seen along the expected course of either ureter to the level of the right lower quadrant ileal conduit. There is a minimal amount of residual asymmetric left-sided perinephric stranding however this appears similar to the 11/2021 examination.  Similar appearance of right lower quadrant ileal conduit without evidence of parastomal hernia.  Normal noncontrast appearance of the bilateral adrenal glands.  Stomach/Bowel: Small hiatal hernia. Large colonic stool burden without evidence of enteric obstruction. Small bowel anastomosis within the right lower abdominal quadrant without evidence of enteric obstruction. Normal appearance of the terminal ileum. The appendix is not visualized and presumably surgically absent. No pneumoperitoneum, pneumatosis or portal venous gas.  Vascular/Lymphatic: Scattered atherosclerotic plaque within normal caliber abdominal aorta.  No bulky retroperitoneal, mesenteric, pelvic or inguinal lymphadenopathy.  Reproductive: The prostate is presumably surgically absent.  Other: Tiny mesenteric fat containing periumbilical hernia.  Musculoskeletal: Lytic lesion involving the right inferior pubic rami and ischium appear similar to PET-CT performed 10/22/2021 measuring approximately 5.1 x 3.4 cm (image 90, series 3). Old/healed  right 10th rib fracture.  IMPRESSION: 1. Interval resolution of previously noted left-sided pelvicaliectasis, the etiology of which is not depicted on this examination, potentially passes of a mucus plug associated with the right lower abdominal ileal conduit. 2. Small residual bilateral extrarenal pelvis ease, the left-sided which is stable since at least the 2020 examination. 3. Similar appearance of known lytic lesion involving the right inferior pubic rami and ischium. 4. Small hiatal hernia. 5. Aortic Atherosclerosis (ICD10-I70.0).   Electronically Signed By: Sandi Mariscal M.D. On: 12/25/2021 13:27   Assessment & Plan:    1. Recurrent UTI -urine for culture. Vantin '200mg'$  BID for 7 days -He will start hiprex 1g BID - Urinalysis, Routine w reflex microscopic   No follow-ups on file.  Nicolette Bang, MD  [facility] [place]

## 2022-04-26 LAB — URINE CULTURE

## 2022-04-27 DIAGNOSIS — L501 Idiopathic urticaria: Secondary | ICD-10-CM | POA: Diagnosis not present

## 2022-04-28 ENCOUNTER — Ambulatory Visit (INDEPENDENT_AMBULATORY_CARE_PROVIDER_SITE_OTHER): Payer: Medicare Other

## 2022-04-28 DIAGNOSIS — L501 Idiopathic urticaria: Secondary | ICD-10-CM | POA: Diagnosis not present

## 2022-05-03 ENCOUNTER — Encounter: Payer: Self-pay | Admitting: Urology

## 2022-05-03 NOTE — Patient Instructions (Signed)
Pyelonephritis, Adult Pyelonephritis is an infection that occurs in the kidney. The kidneys are the organs that filter a person's blood and move waste out of the bloodstream and into the urine. Urine passes from the kidneys, through tubes called ureters, and into the bladder. There are two main types of pyelonephritis: Infections that come on quickly without any warning (acute pyelonephritis). Infections that last for a long period of time (chronic pyelonephritis). In most cases, the infection clears up with treatment and does not cause further problems. More severe infections or chronic infections can sometimes spread to the bloodstream or lead to other problems with the kidneys. What are the causes? This condition is usually caused by: Bacteria traveling from the bladder up to the kidney. This may occur after having a bladder infection (cystitis) or urinary tract infection (UTI). Bladder infections caused from bacteria traveling from the bloodstream to the kidney. What increases the risk? This condition is more likely to develop in: Pregnant women. Older people. People who have any of these conditions: Diabetes. Inflammation of the prostate gland (prostatitis), in males. Kidney stones or bladder stones. Other abnormalities of the kidney or ureter. Cancer. People who have a catheter placed in the bladder. People who are sexually active. Women who use spermicides. People who have had a prior UTI. What are the signs or symptoms? Symptoms of this condition include: Frequent urination. Strong or persistent urge to urinate. Burning or stinging when urinating. Abdominal pain. Back pain. Pain in the side or flank area. Fever or chills. Blood in the urine, or dark urine. Nausea or vomiting. How is this diagnosed? This condition may be diagnosed based on: Your medical history and a physical exam. Urine tests. Blood tests. You may also have imaging tests of the kidneys, such as an  ultrasound or CT scan. How is this treated? Treatment for this condition may depend on the severity of the infection. If the infection is mild and is found early, you may be treated with antibiotic medicines taken by mouth (orally). You will need to drink fluids to remain hydrated. If the infection is more severe, you may need to stay in the hospital and receive antibiotics given directly into a vein through an IV. You may also need to receive fluids through an IV if you are not able to remain hydrated. After your hospital stay, you may need to take oral antibiotics for a period of time. Other treatments may be required, depending on the cause of the infection. Follow these instructions at home: Medicines Take your antibiotic medicine as told by your health care provider. Do not stop taking the antibiotic even if you start to feel better. Take over-the-counter and prescription medicines only as told by your health care provider. General instructions  Drink enough fluid to keep your urine pale yellow. Avoid caffeine, tea, and carbonated beverages. They tend to irritate the bladder. Urinate often. Avoid holding in urine for long periods of time. Urinate before and after sex. After a bowel movement, women should cleanse from front to back. Use each tissue only once. Keep all follow-up visits as told by your health care provider. This is important. Contact a health care provider if: Your symptoms do not get better after 2 days of treatment. Your symptoms get worse. You have a fever. Get help right away if you: Are unable to take your antibiotics or fluids. Have shaking chills. Vomit. Have severe flank or back pain. Have extreme weakness or fainting. Summary Pyelonephritis is a urinary tract infection (  UTI) that occurs in the kidney. Treatment for this condition may depend on the severity of the infection. Take your antibiotic medicine as told by your health care provider. Do not stop  taking the antibiotic even if you start to feel better. Drink enough fluid to keep your urine pale yellow. Keep all follow-up visits as told by your health care provider. This is important. This information is not intended to replace advice given to you by your health care provider. Make sure you discuss any questions you have with your health care provider. Document Revised: 03/12/2021 Document Reviewed: 03/12/2021 [employer] Patient Education  2023 [employer]  

## 2022-05-05 ENCOUNTER — Emergency Department (HOSPITAL_COMMUNITY)
Admission: EM | Admit: 2022-05-05 | Discharge: 2022-05-06 | Disposition: A | Discharge status: Home / Self Care | Payer: Medicare Other | Attending: Emergency Medicine | Admitting: Emergency Medicine

## 2022-05-05 ENCOUNTER — Emergency Department (HOSPITAL_COMMUNITY): Payer: Medicare Other

## 2022-05-05 ENCOUNTER — Other Ambulatory Visit: Payer: Self-pay

## 2022-05-05 ENCOUNTER — Encounter (HOSPITAL_COMMUNITY): Payer: Self-pay | Admitting: *Deleted

## 2022-05-05 DIAGNOSIS — K769 Liver disease, unspecified: Secondary | ICD-10-CM | POA: Diagnosis not present

## 2022-05-05 DIAGNOSIS — N3091 Cystitis, unspecified with hematuria: Secondary | ICD-10-CM | POA: Diagnosis not present

## 2022-05-05 DIAGNOSIS — N1330 Unspecified hydronephrosis: Secondary | ICD-10-CM | POA: Diagnosis not present

## 2022-05-05 DIAGNOSIS — Z7901 Long term (current) use of anticoagulants: Secondary | ICD-10-CM | POA: Insufficient documentation

## 2022-05-05 DIAGNOSIS — D72829 Elevated white blood cell count, unspecified: Secondary | ICD-10-CM | POA: Diagnosis not present

## 2022-05-05 DIAGNOSIS — K449 Diaphragmatic hernia without obstruction or gangrene: Secondary | ICD-10-CM | POA: Diagnosis not present

## 2022-05-05 DIAGNOSIS — Z8551 Personal history of malignant neoplasm of bladder: Secondary | ICD-10-CM | POA: Diagnosis not present

## 2022-05-05 DIAGNOSIS — R319 Hematuria, unspecified: Secondary | ICD-10-CM | POA: Diagnosis present

## 2022-05-05 DIAGNOSIS — K5730 Diverticulosis of large intestine without perforation or abscess without bleeding: Secondary | ICD-10-CM | POA: Diagnosis not present

## 2022-05-05 DIAGNOSIS — N3090 Cystitis, unspecified without hematuria: Secondary | ICD-10-CM

## 2022-05-05 LAB — BASIC METABOLIC PANEL
Anion gap: 7 (ref 5–15)
BUN: 26 mg/dL — ABNORMAL HIGH (ref 8–23)
CO2: 23 mmol/L (ref 22–32)
Calcium: 8.6 mg/dL — ABNORMAL LOW (ref 8.9–10.3)
Chloride: 108 mmol/L (ref 98–111)
Creatinine, Ser: 0.94 mg/dL (ref 0.61–1.24)
GFR, Estimated: >60 mL/min (ref >60)
Glucose, Bld: 127 mg/dL — ABNORMAL HIGH (ref 70–99)
Potassium: 4 mmol/L (ref 3.5–5.1)
Sodium: 138 mmol/L (ref 135–145)

## 2022-05-05 LAB — URINALYSIS, ROUTINE W REFLEX MICROSCOPIC
Bilirubin Urine: NEGATIVE
Glucose, UA: NEGATIVE mg/dL
Ketones, ur: NEGATIVE mg/dL
Leukocytes,Ua: NEGATIVE
Nitrite: NEGATIVE
Protein, ur: 100 mg/dL — AB
RBC / HPF: >50 RBC/hpf — ABNORMAL HIGH (ref 0–5)
Specific Gravity, Urine: 1.009 (ref 1.005–1.030)
Trans Epithel, UA: 1
WBC, UA: >50 WBC/hpf — ABNORMAL HIGH (ref 0–5)
pH: 6 (ref 5.0–8.0)

## 2022-05-05 LAB — CBC WITH DIFFERENTIAL/PLATELET
Abs Immature Granulocytes: 0.08 10*3/uL — ABNORMAL HIGH (ref 0.00–0.07)
Basophils Absolute: 0.1 10*3/uL (ref 0.0–0.1)
Basophils Relative: 1 %
Eosinophils Absolute: 0.1 10*3/uL (ref 0.0–0.5)
Eosinophils Relative: 1 %
HCT: 41.7 % (ref 39.0–52.0)
Hemoglobin: 14.2 g/dL (ref 13.0–17.0)
Immature Granulocytes: 1 %
Lymphocytes Relative: 5 %
Lymphs Abs: 0.6 10*3/uL — ABNORMAL LOW (ref 0.7–4.0)
MCH: 35.5 pg — ABNORMAL HIGH (ref 26.0–34.0)
MCHC: 34.1 g/dL (ref 30.0–36.0)
MCV: 104.3 fL — ABNORMAL HIGH (ref 80.0–100.0)
Monocytes Absolute: 1 10*3/uL (ref 0.1–1.0)
Monocytes Relative: 8 %
Neutro Abs: 10.5 10*3/uL — ABNORMAL HIGH (ref 1.7–7.7)
Neutrophils Relative %: 84 %
Platelets: 159 10*3/uL (ref 150–400)
RBC: 4 MIL/uL — ABNORMAL LOW (ref 4.22–5.81)
RDW: 13.2 % (ref 11.5–15.5)
WBC: 12.4 10*3/uL — ABNORMAL HIGH (ref 4.0–10.5)
nRBC: 0 % (ref 0.0–0.2)

## 2022-05-05 MED ORDER — LEVOFLOXACIN 500 MG PO TABS: 500.0000 mg | ORAL_TABLET | Freq: Every day | ORAL | 0 refills | Status: DC

## 2022-05-05 MED ORDER — SODIUM CHLORIDE 0.9 % IV SOLN
2.0000 g | Freq: Once | INTRAVENOUS | Status: AC
  Administered 2022-05-05: 2 g via INTRAVENOUS
  Filled 2022-05-05: qty 2

## 2022-05-05 MED ORDER — SODIUM CHLORIDE 0.9 % IV SOLN
2.0000 g | Freq: Once | INTRAVENOUS | Status: DC
  Filled 2022-05-05: qty 20

## 2022-05-05 NOTE — Discharge Instructions (Addendum)
Follow-up with your urologist Monday or Tuesday.  Call them tomorrow to make an appointment.  If you start developing a high fever or vomiting and cannot keep your medicine down you should return to the emergency department.  If you return to the emergency department on Saturday or Sunday you should go to [facility] instead of [facility].  You should also stop taking your Eliquis for 2 days

## 2022-05-05 NOTE — ED Notes (Signed)
Edp in room  

## 2022-05-05 NOTE — ED Notes (Signed)
Pt has urostomy

## 2022-05-05 NOTE — ED Notes (Signed)
Called ac for med

## 2022-05-05 NOTE — ED Provider Notes (Signed)
[facility] EMERGENCY DEPARTMENT Provider Note   CSN: [id number] Arrival date & time: 05/05/22  1906     History  Chief Complaint  Patient presents with   Hematuria    [name] is a 81 y.o. male.  pt complains of blood in his urine.  Patient has a urostomy bag..  Patient has a history of bladder cancer  The history is provided by the patient and medical records. No language interpreter was used.  Hematuria This is a new problem. The current episode started 12 to 24 hours ago. The problem occurs constantly. The problem has not changed since onset.Pertinent negatives include no chest pain, no abdominal pain and no headaches. Nothing aggravates the symptoms. Nothing relieves the symptoms. He has tried nothing for the symptoms.       Home Medications Prior to Admission medications   Medication Sig Start Date End Date Taking? Authorizing Provider  levofloxacin (LEVAQUIN) 500 MG tablet Take 1 tablet (500 mg total) by mouth daily. 05/05/22  Yes Milton Ferguson, MD  acetaminophen (TYLENOL) 500 MG tablet Take 1,000 mg by mouth every 6 (six) hours as needed.    [provider]  apixaban (ELIQUIS) 5 MG TABS tablet Take 1 tablet (5 mg total) by mouth 2 (two) times daily. 04/17/22   Johnson, Clanford L, MD  cefpodoxime (VANTIN) 200 MG tablet Take 1 tablet (200 mg total) by mouth 2 (two) times daily. 04/23/22   McKenzie, Candee Furbish, MD  clotrimazole-betamethasone (LOTRISONE) cream Apply 1 application topically in the morning and at bedtime. Patient not taking: Reported on 04/23/2022 06/27/20   [provider]  HYDROmorphone (DILAUDID) 4 MG tablet Take 1 tablet (4 mg total) by mouth every 4 (four) hours as needed for severe pain. 03/23/22   [name], MD  lactose free nutrition (BOOST) LIQD Take 237 mLs by mouth 3 (three) times daily between meals.     [provider]  levothyroxine (SYNTHROID) 50 MCG tablet Take 50 mcg by mouth daily. Patient not taking: Reported  on 04/11/2022 10/01/21   [provider]  methenamine (HIPREX) 1 g tablet Take 1 tablet (1 g total) by mouth 2 (two) times daily with a meal. 04/23/22   McKenzie, Candee Furbish, MD  metoprolol tartrate (LOPRESSOR) 25 MG tablet Take 1 tablet (25 mg total) by mouth 2 (two) times daily. 04/17/22   Johnson, Clanford L, MD  pantoprazole (PROTONIX) 40 MG tablet Take 1 tablet (40 mg total) by mouth daily. 04/17/22   Johnson, Clanford L, MD  polyethylene glycol (MIRALAX / GLYCOLAX) 17 g packet Take 17 g by mouth daily as needed for mild constipation. 07/18/20   Roxan Hockey, MD      Allergies    Ciprofloxacin and Zofran [ondansetron]    Review of Systems   Review of Systems  Constitutional:  Negative for appetite change and fatigue.  HENT:  Negative for congestion, ear discharge and sinus pressure.   Eyes:  Negative for discharge.  Respiratory:  Negative for cough.   Cardiovascular:  Negative for chest pain.  Gastrointestinal:  Negative for abdominal pain and diarrhea.  Genitourinary:  Positive for hematuria. Negative for frequency.  Musculoskeletal:  Negative for back pain.  Skin:  Negative for rash.  Neurological:  Negative for seizures and headaches.  Psychiatric/Behavioral:  Negative for hallucinations.     Physical Exam Updated Vital Signs BP (!) 148/132   Pulse 66   Temp 97.8 F (36.6 C) (Oral)   Resp 14   Ht  $'5\' 11"'Z$  (1.803 m)   Wt 90.7 kg   SpO2 95%   BMI 27.89 kg/m  Physical Exam Vitals and nursing note reviewed.  Constitutional:      Appearance: He is well-developed.  HENT:     Head: Normocephalic.     Nose: Nose normal.  Eyes:     General: No scleral icterus.    Conjunctiva/sclera: Conjunctivae normal.  Neck:     Thyroid: No thyromegaly.  Cardiovascular:     Rate and Rhythm: Normal rate and regular rhythm.     Heart sounds: No murmur heard.    No friction rub. No gallop.  Pulmonary:     Breath sounds: No stridor. No wheezing or rales.  Chest:     Chest wall:  No tenderness.  Abdominal:     General: There is no distension.     Tenderness: There is no abdominal tenderness. There is no rebound.  Musculoskeletal:        General: Normal range of motion.     Cervical back: Neck supple.  Lymphadenopathy:     Cervical: No cervical adenopathy.  Skin:    Findings: No erythema or rash.  Neurological:     Mental Status: He is alert and oriented to person, place, and time.     Motor: No abnormal muscle tone.     Coordination: Coordination normal.  Psychiatric:        Behavior: Behavior normal.     ED Results / Procedures / Treatments   Labs (all labs ordered are listed, but only abnormal results are displayed) Labs Reviewed  URINALYSIS, ROUTINE W REFLEX MICROSCOPIC - Abnormal; Notable for the following components:      Result Value   Color, Urine RED (*)    APPearance HAZY (*)    Hgb urine dipstick LARGE (*)    Protein, ur 100 (*)    RBC / HPF >50 (*)    WBC, UA >50 (*)    Bacteria, UA RARE (*)    All other components within normal limits  CBC WITH DIFFERENTIAL/PLATELET - Abnormal; Notable for the following components:   WBC 12.4 (*)    RBC 4.00 (*)    MCV 104.3 (*)    MCH 35.5 (*)    Neutro Abs 10.5 (*)    Lymphs Abs 0.6 (*)    Abs Immature Granulocytes 0.08 (*)    All other components within normal limits  BASIC METABOLIC PANEL - Abnormal; Notable for the following components:   Glucose, Bld 127 (*)    BUN 26 (*)    Calcium 8.6 (*)    All other components within normal limits  URINE CULTURE    EKG None  Radiology CT Renal Stone Study  Result Date: 05/05/2022 CLINICAL DATA:  Flank pain, kidney stone suspected. Pt with blood in urine started this afternoon. Recent with UTI EXAM: CT ABDOMEN AND PELVIS WITHOUT CONTRAST TECHNIQUE: Multidetector CT imaging of the abdomen and pelvis was performed following the standard protocol without IV contrast. RADIATION DOSE REDUCTION: This exam was performed according to the departmental  dose-optimization program which includes automated exposure control, adjustment of the mA and/or kV according to patient size and/or use of iterative reconstruction technique. COMPARISON:  CT abdomen pelvis 04/11/2022 FINDINGS: Lower chest: Trace to small right and trace left pleural effusions. Small hiatal hernia. Hepatobiliary: Several fluid density lesions within the right hepatic lobe likely represent simple renal cysts. No pulmonary nodule. No pulmonary mass. No pleural effusion. No pneumothorax. Pancreas: No focal  lesion. Normal pancreatic contour. No surrounding inflammatory changes. No main pancreatic ductal dilatation. Spleen: Normal in size without focal abnormality. Adrenals/Urinary Tract: No adrenal nodule bilaterally. Persistent mild left hydronephrosis and severe left hydroureter with associated urothelial thickening. No nephroureterolithiasis. No hydronephrosis on the right. Status post radical cystoprostatectomy with lower quadrant ileal conduit formation. Stomach/Bowel: Stomach is within normal limits. No evidence of bowel wall thickening or dilatation. Colonic diverticulosis. The appendix is not definitely identified with no inflammatory changes in the right lower quadrant to suggest acute appendicitis. Vascular/Lymphatic: No abdominal aorta or iliac aneurysm. At least moderate atherosclerotic plaque of the aorta and its branches. No abdominal, pelvic, or inguinal lymphadenopathy. Reproductive: Prostatectomy. Other: No intraperitoneal free fluid. No intraperitoneal free gas. No organized fluid collection. Musculoskeletal: No abdominal wall hernia or abnormality. Similar-appearing expansile lytic lesion of the right inferior pubic rami. No acute displaced fracture. IMPRESSION: 1. Persistent mild left hydronephrosis and severe left hydroureter with associated urothelial thickening in a patient status post radical cystoprostatectomy with lower quadrant ileal conduit formation. This may reflect chronic  changes of obstructive uropathy or reflux. Limited evaluation on this noncontrast study. Recommend correlation with urinalysis for infection. 2. Trace to small right and trace left pleural effusions. 3. Colonic diverticulosis with no acute diverticulitis. 4. Similar-appearing expansile lytic lesion of the right inferior pubic rami. Finding likely represents an osseous metastasis. 5.  Aortic Atherosclerosis (ICD10-I70.0). Electronically Signed   By: Iven Finn M.D.   On: 05/05/2022 23:00    Procedures Procedures    Medications Ordered in ED Medications  cefTAZidime (FORTAZ) 2 g in sodium chloride 0.9 % 100 mL IVPB (2 g Intravenous New Bag/Given 05/05/22 2306)    ED Course/ Medical Decision Making/ A&P                           Medical Decision Making Amount and/or Complexity of Data Reviewed Labs: ordered. Radiology: ordered.  Risk Prescription drug management.  This patient presents to the ED for concern of hematuria, this involves an extensive number of treatment options, and is a complaint that carries with it a high risk of complications and morbidity.  The differential diagnosis includes worsening bladder cancer, UTI   Co morbidities that complicate the patient evaluation  Bladder cancer and chronic UTIs   Additional history obtained:  Additional history obtained from wife External records from outside source obtained and reviewed including hospital records   Lab Tests:  I Ordered, and personally interpreted labs.  The pertinent results include: White count 12.4 and hemoglobin 14.2   Imaging Studies ordered:  I ordered imaging studies including CT renal I independently visualized and interpreted imaging which showed no acute changes but persistent left hydronephrosis  I agree with the radiologist interpretation   Cardiac Monitoring: / EKG:  The patient was maintained on a cardiac monitor.  I personally viewed and interpreted the cardiac monitored which showed  an underlying rhythm of: Normal sinus rhythm   Consultations Obtained:  No consultant  Problem List / ED Course / Critical interventions / Medication management  Bladder cancer and hematuria I ordered medication including Levaquin for possible UTI Reevaluation of the patient after these medicines showed that the patient stayed the same I have reviewed the patients home medicines and have made adjustments as needed   Social Determinants of Health:  None   Test / Admission - Considered:  No other test needed in ED  Patient with hematuria and possible urinary tract infection.  He is also taking Eliquis and that will be stopped for 2 days.  Urine culture done.  Patient is started on Levaquin because his last culture showed that Levaquin and Cipro were the only p.o. antibiotics that could help his infections.  He is also allergic to Cipro.  Patient will follow-up with urologist this week        Final Clinical Impression(s) / ED Diagnoses Final diagnoses:  Cystitis    Rx / DC Orders ED Discharge Orders          Ordered    levofloxacin (LEVAQUIN) 500 MG tablet  Daily        05/05/22 2318              Milton Ferguson, MD 05/07/22 1241

## 2022-05-05 NOTE — ED Triage Notes (Signed)
Pt with blood in urine started this afternoon.  Recent with UTI

## 2022-05-05 NOTE — ED Notes (Signed)
Lab in room.

## 2022-05-07 ENCOUNTER — Telehealth: Payer: Self-pay

## 2022-05-09 LAB — URINE CULTURE: Culture: >=100000 — AB

## 2022-05-10 ENCOUNTER — Ambulatory Visit (INDEPENDENT_AMBULATORY_CARE_PROVIDER_SITE_OTHER): Payer: Medicare Other | Admitting: Urology

## 2022-05-10 ENCOUNTER — Encounter: Payer: Self-pay | Admitting: Urology

## 2022-05-10 VITALS — BP 100/60 | HR 75

## 2022-05-10 DIAGNOSIS — Z87440 Personal history of urinary (tract) infections: Secondary | ICD-10-CM | POA: Diagnosis not present

## 2022-05-10 DIAGNOSIS — Z8551 Personal history of malignant neoplasm of bladder: Secondary | ICD-10-CM | POA: Diagnosis not present

## 2022-05-10 DIAGNOSIS — N1330 Unspecified hydronephrosis: Secondary | ICD-10-CM | POA: Diagnosis not present

## 2022-05-10 DIAGNOSIS — N12 Tubulo-interstitial nephritis, not specified as acute or chronic: Secondary | ICD-10-CM

## 2022-05-10 DIAGNOSIS — N390 Urinary tract infection, site not specified: Secondary | ICD-10-CM

## 2022-05-10 DIAGNOSIS — C679 Malignant neoplasm of bladder, unspecified: Secondary | ICD-10-CM

## 2022-05-10 NOTE — Progress Notes (Signed)
ED Antimicrobial Stewardship Positive Culture Follow Up   [name] is an 81 y.o. male who presented to [facility] on 05/05/2022 with a chief complaint of  Chief Complaint  Patient presents with   Hematuria    Recent Results (from the past 720 hour(s))  Resp Panel by RT-PCR (Flu A&B, Covid) Anterior Nasal Swab     Status: None   Collection Time: 08/27/Lance 11:13 AM   Specimen: Anterior Nasal Swab  Result Value Ref Range Status   SARS Coronavirus 2 by RT PCR NEGATIVE NEGATIVE Final    Comment: (NOTE) SARS-CoV-2 target nucleic acids are NOT DETECTED.  The SARS-CoV-2 RNA is generally detectable in upper respiratory specimens during the acute phase of infection. The lowest concentration of SARS-CoV-2 viral copies this assay can detect is 138 copies/mL. A negative result does not preclude SARS-Cov-2 infection and should not be used as the sole basis for treatment or other patient management decisions. A negative result may occur with  improper specimen collection/handling, submission of specimen other than nasopharyngeal swab, presence of viral mutation(s) within the areas targeted by this assay, and inadequate number of viral copies(<138 copies/mL). A negative result must be combined with clinical observations, patient history, and epidemiological information. The expected result is Negative.  Fact Sheet for Patients:  EntrepreneurPulse.com.au  Fact Sheet for Healthcare Providers:  IncredibleEmployment.be  This test is no t yet approved or cleared by the [place] FDA and  has been authorized for detection and/or diagnosis of SARS-CoV-2 by FDA under an Emergency Use Authorization (EUA). This EUA will remain  in effect (meaning this test can be used) for the duration of the COVID-19 declaration under Section 564(b)(1) of the Act, 21 U.S.C.section 360bbb-3(b)(1), unless the authorization is terminated  or revoked sooner.        Influenza A by PCR NEGATIVE NEGATIVE Final   Influenza B by PCR NEGATIVE NEGATIVE Final    Comment: (NOTE) The Xpert Xpress SARS-CoV-2/FLU/RSV plus assay is intended as an aid in the diagnosis of influenza from Nasopharyngeal swab specimens and should not be used as a sole basis for treatment. Nasal washings and aspirates are unacceptable for Xpert Xpress SARS-CoV-2/FLU/RSV testing.  Fact Sheet for Patients: EntrepreneurPulse.com.au  Fact Sheet for Healthcare Providers: IncredibleEmployment.be  This test is not yet approved or cleared by the [place] FDA and has been authorized for detection and/or diagnosis of SARS-CoV-2 by FDA under an Emergency Use Authorization (EUA). This EUA will remain in effect (meaning this test can be used) for the duration of the COVID-19 declaration under Section 564(b)(1) of the Act, 21 U.S.C. section 360bbb-3(b)(1), unless the authorization is terminated or revoked.  Performed at [facility], [address], [place], [place] [address]   Culture, blood (routine x 2)     Status: None   Collection Time: 08/27/Lance 11:38 AM   Specimen: BLOOD  Result Value Ref Range Status   Specimen Description BLOOD LEFT ANTECUBITAL  Final   Special Requests   Final    Blood Culture adequate volume BOTTLES DRAWN AEROBIC AND ANAEROBIC   Culture   Final    NO GROWTH 5 DAYS Performed at [facility], [address], [place], [place] [address]    Report Status 04/16/2022 FINAL  Final  Culture, blood (routine x 2)     Status: None   Collection Time: 08/27/Lance 11:46 AM   Specimen: BLOOD  Result Value Ref Range Status   Specimen Description BLOOD BLOOD RIGHT HAND  Final   Special  Requests   Final    BOTTLES DRAWN AEROBIC AND ANAEROBIC Blood Culture adequate volume   Culture   Final    NO GROWTH 5 DAYS Performed at [facility], [address] [place], [place] [address]    Report Status 04/16/2022 FINAL  Final  Urine Culture      Status: Abnormal   Collection Time: 08/27/Lance 11:59 AM   Specimen: Urine, Clean Catch  Result Value Ref Range Status   Specimen Description   Final    URINE, CLEAN CATCH Performed at [facility], [address], [place], [place] [address]    Special Requests   Final    NONE Performed at [facility], [address], [place], [place] [address]    Culture 20,000 COLONIES/mL PSEUDOMONAS AERUGINOSA (A)  Final   Report Status 04/13/2022 FINAL  Final   Organism ID, Bacteria PSEUDOMONAS AERUGINOSA (A)  Final      Susceptibility   Pseudomonas aeruginosa - MIC*    CEFTAZIDIME 8 SENSITIVE Sensitive     CIPROFLOXACIN 1 INTERMEDIATE Intermediate     GENTAMICIN <=1 SENSITIVE Sensitive     IMIPENEM 2 SENSITIVE Sensitive     PIP/TAZO 16 SENSITIVE Sensitive     CEFEPIME 4 SENSITIVE Sensitive     * 20,000 COLONIES/mL PSEUDOMONAS AERUGINOSA  Urine Culture     Status: Abnormal   Collection Time: 09/08/Lance 11:07 AM   Specimen: Urine   Urine  Result Value Ref Range Status   Urine Culture, Routine Final report (A)  Final   Organism ID, Bacteria Comment (A)  Final    Comment: Pseudomonas aeruginosa Greater than 100,000 colony forming units per mL    ORGANISM ID, BACTERIA Comment  Final    Comment: Mixed urogenital flora 25,000-50,000 colony forming units per mL    Antimicrobial Susceptibility Comment  Final    Comment:       ** S = Susceptible; I = Intermediate; R = Resistant **                    P = Positive; N = Negative             MICS are expressed in micrograms per mL    Antibiotic                 RSLT#1    RSLT#2    RSLT#3    RSLT#4 Amikacin                       S Cefepime                       S Ceftazidime                    S Ciprofloxacin                  S Gentamicin                     S Imipenem                       S Levofloxacin                   I Meropenem                      S Piperacillin  S Ticarcillin                    R Tobramycin                      S   Microscopic Examination     Status: Abnormal   Collection Time: 09/08/Lance 11:07 AM   Urine  Result Value Ref Range Status   WBC, UA >30 (A) 0 - 5 /hpf Final   RBC, Urine >30 (A) 0 - 2 /hpf Final   Epithelial Cells (non renal) 0-10 0 - 10 /hpf Final   Renal Epithel, UA None seen None seen /hpf Final   Mucus, UA Present Not Estab. Final   Bacteria, UA Moderate (A) None seen/Few Final  Urine Culture     Status: Abnormal   Collection Time: 09/20/Lance  8:48 PM   Specimen: Urine, Clean Catch  Result Value Ref Range Status   Specimen Description   Final    URINE, CLEAN CATCH Performed at [facility], [address], [place], [place] [address]    Special Requests   Final    NONE Performed at [facility], [address] [place], [place] [address]    Culture (A)  Final    >=100,000 COLONIES/mL PSEUDOMONAS AERUGINOSA >=100,000 COLONIES/mL ENTEROCOCCUS FAECALIS    Report Status 05/09/2022 FINAL  Final   Organism ID, Bacteria PSEUDOMONAS AERUGINOSA (A)  Final   Organism ID, Bacteria ENTEROCOCCUS FAECALIS (A)  Final      Susceptibility   Enterococcus faecalis - MIC*    AMPICILLIN <=2 SENSITIVE Sensitive     NITROFURANTOIN <=16 SENSITIVE Sensitive     VANCOMYCIN 1 SENSITIVE Sensitive     * >=100,000 COLONIES/mL ENTEROCOCCUS FAECALIS   Pseudomonas aeruginosa - MIC*    CEFTAZIDIME 8 SENSITIVE Sensitive     CIPROFLOXACIN <=0.25 SENSITIVE Sensitive     GENTAMICIN <=1 SENSITIVE Sensitive     IMIPENEM 2 SENSITIVE Sensitive     CEFEPIME 4 SENSITIVE Sensitive     * >=100,000 COLONIES/mL PSEUDOMONAS AERUGINOSA  [name] who presenetd with hematuria had a urine culture that grew pans sensitive pseudomonas and enterococcus faecalis. Patient was discharged with levaquin.  Plan: -Finish out levaquin '500mg'$  qd -Initiate amoxicillin 1g q8h for 5 days  ED Provider: [facility]   Sandford Craze, PharmD. [facility] Acute Care PGY-1  05/10/2022 11:39 AM

## 2022-05-10 NOTE — Progress Notes (Unsigned)
05/10/2022 10:48 AM   [name] 04-27-1941 [id number]  Referring provider: [name], MD [address] [place],  [place] [address]  Followup UTI and left hydronephrosis   HPI: [name] is a 81yo here for followup for recurrent UTI and left hydronephrosis. He presented to the ER on 9/20 with gross hematuria and back pain. He had a CT on 9/20 which showed left ureteral enhancement and left hydronephrosis. He was started on levaquin and urine is now clear. Urine culture grew pseudomonas and enterococcus. He remains on hiprex.    PMH: Past Medical History:  Diagnosis Date   Bladder cancer ([place])    Bone cancer ([place])    BPH (benign prostatic hypertrophy)    Dysuria    GERD (gastroesophageal reflux disease)    History of adenomatous polyp of colon    tubular adenoma 06/ 2018   History of bladder cancer 12/2014   urologist-  dr [name]--  dx High Grade TCC without stromal invasion s/p TURBT and intravesical BCG tx/  recurrent 07/2015 (pTa) TCC  repear intravesical BCG tx    History of hiatal hernia    History of urethral stricture    Nocturia     Surgical History: Past Surgical History:  Procedure Laterality Date   CARDIAC CATHETERIZATION  1997   normal coronary arteries   CARDIOVASCULAR STRESS TEST  12-14-2005   Low risk perfusion study/  very small scar in the inferior septum from mid ventricle to apex,  no ischemia/  mild inferior septal hypokinesis/  ef 58%   CATARACT EXTRACTION W/ INTRAOCULAR LENS  IMPLANT, BILATERAL  2011   COLONOSCOPY  last one 06/ 2018   CYSTOSCOPY WITH BIOPSY N/A 08/12/2015   Procedure: CYSTOSCOPY WITH BIOPSY AND FULGERATION;  Surgeon: [name], MD;  Location: [facility];  Service: Urology;  Laterality: N/A;   CYSTOSCOPY WITH INJECTION N/A 06/29/2017   Procedure: CYSTOSCOPY WITH INJECTION OF INDOCYANINE GREEN DYE;  Surgeon: Alexis Frock, MD;  Location: [facility];  Service: Urology;  Laterality: N/A;   CYSTOSCOPY  WITH URETHRAL DILATATION N/A 03/21/2017   Procedure: CYSTOSCOPY;  Surgeon: [name], MD;  Location: [facility];  Service: Urology;  Laterality: N/A;   ESOPHAGOGASTRODUODENOSCOPY  12-05-2014   IR IMAGING GUIDED PORT INSERTION  02/07/2018   IR NEPHROSTOMY PLACEMENT LEFT  12/25/2021   IR REMOVAL TUN ACCESS W/ PORT W/O FL MOD SED  04/14/2018   TRANSTHORACIC ECHOCARDIOGRAM  01-31-2008   nomral LVF,  ef 55-65%/  mild pulmonic stenosis without regurg.,  peak transpulomonic valve gradient 40mHg   TRANSURETHRAL RESECTION OF BLADDER TUMOR N/A 12/31/2014   Procedure: TRANSURETHRAL RESECTION OF BLADDER TUMOR (TURBT);  Surgeon: MLowella Bandy MD;  [facility]  Service: Urology;  Laterality: N/A;   TRANSURETHRAL RESECTION OF BLADDER TUMOR N/A 03/21/2017   Procedure: POSSIBLE TRANSURETHRAL RESECTION OF BLADDER TUMOR (TURBT);  Surgeon: BNickie Retort MD;  [facility]  Service: Urology;  Laterality: N/A;    Home Medications:  Allergies as of 05/10/2022       Reactions   Ciprofloxacin Hives   Zofran [ondansetron] Rash        Medication List        Accurate as of May 10, 2022 10:48 AM. If you have any questions, ask your nurse or doctor.          acetaminophen 500 MG tablet Commonly known as: TYLENOL Take 1,000 mg by mouth every 6 (six) hours  as needed.   apixaban 5 MG Tabs tablet Commonly known as: ELIQUIS Take 1 tablet (5 mg total) by mouth 2 (two) times daily.   cefpodoxime 200 MG tablet Commonly known as: VANTIN Take 1 tablet (200 mg total) by mouth 2 (two) times daily.   clotrimazole-betamethasone cream Commonly known as: LOTRISONE Apply 1 application topically in the morning and at bedtime.   HYDROmorphone 4 MG tablet Commonly known as: DILAUDID Take 1 tablet (4 mg total) by mouth every 4 (four) hours as needed for severe pain.   lactose free nutrition Liqd Take 237 mLs by mouth 3 (three) times  daily between meals.   levofloxacin 500 MG tablet Commonly known as: LEVAQUIN Take 1 tablet (500 mg total) by mouth daily.   levothyroxine 50 MCG tablet Commonly known as: SYNTHROID Take 50 mcg by mouth daily.   methenamine 1 g tablet Commonly known as: Hiprex Take 1 tablet (1 g total) by mouth 2 (two) times daily with a meal.   metoprolol tartrate 25 MG tablet Commonly known as: LOPRESSOR Take 1 tablet (25 mg total) by mouth 2 (two) times daily.   pantoprazole 40 MG tablet Commonly known as: PROTONIX Take 1 tablet (40 mg total) by mouth daily.   polyethylene glycol 17 g packet Commonly known as: MIRALAX / GLYCOLAX Take 17 g by mouth daily as needed for mild constipation.        Allergies:  Allergies  Allergen Reactions   Ciprofloxacin Hives   Zofran [Ondansetron] Rash    Family History: Family History  Problem Relation Age of Onset   Alcoholism Father    Colon cancer Neg Hx    Colon polyps Neg Hx    Diabetes Neg Hx    Kidney disease Neg Hx    Esophageal cancer Neg Hx    Heart disease Neg Hx    Gallbladder disease Neg Hx    Allergic rhinitis Neg Hx    Angioedema Neg Hx    Asthma Neg Hx    Atopy Neg Hx    Eczema Neg Hx    Immunodeficiency Neg Hx    Urticaria Neg Hx     Social History:  reports that he quit smoking about 33 years ago. His smoking use included cigarettes. He has a 34.00 pack-year smoking history. He has never used smokeless tobacco. He reports current alcohol use of about 6.0 standard drinks of alcohol per week. He reports that he does not use drugs.  ROS: All other review of systems were reviewed and are negative except what is noted above in HPI  Physical Exam: BP 100/60   Pulse 75   Constitutional:  Alert and oriented, No acute distress. HEENT: [place] AT, moist mucus membranes.  Trachea midline, no masses. Cardiovascular: No clubbing, cyanosis, or edema. Respiratory: Normal respiratory effort, no increased work of breathing. GI:  Abdomen is soft, nontender, nondistended, no abdominal masses GU: No CVA tenderness.  Lymph: No cervical or inguinal lymphadenopathy. Skin: No rashes, bruises or suspicious lesions. Neurologic: Grossly intact, no focal deficits, moving all 4 extremities. Psychiatric: Normal mood and affect.  Laboratory Data: Lab Results  Component Value Date   WBC 12.4 (H) 05/05/2022   HGB 14.2 05/05/2022   HCT 41.7 05/05/2022   MCV 104.3 (H) 05/05/2022   PLT 159 05/05/2022    Lab Results  Component Value Date   CREATININE 0.94 05/05/2022    No results found for: "PSA"  No results found for: "TESTOSTERONE"  Lab Results  Component Value Date  HGBA1C 5.1 06/16/2020    Urinalysis    Component Value Date/Time   COLORURINE RED (A) 05/05/2022 2048   APPEARANCEUR HAZY (A) 05/05/2022 2048   APPEARANCEUR Hazy (A) 04/23/2022 1107   LABSPEC 1.009 05/05/2022 2048   PHURINE 6.0 05/05/2022 2048   GLUCOSEU NEGATIVE 05/05/2022 2048   HGBUR LARGE (A) 05/05/2022 2048   BILIRUBINUR NEGATIVE 05/05/2022 2048   BILIRUBINUR Negative 04/23/2022 1107   KETONESUR NEGATIVE 05/05/2022 2048   PROTEINUR 100 (A) 05/05/2022 2048   NITRITE NEGATIVE 05/05/2022 2048   LEUKOCYTESUR NEGATIVE 05/05/2022 2048    Lab Results  Component Value Date   LABMICR See below: 04/23/2022   WBCUA >30 (A) 04/23/2022   LABEPIT 0-10 04/23/2022   MUCUS Present 04/23/2022   BACTERIA RARE (A) 05/05/2022    Pertinent Imaging: *** No results found for this or any previous visit.  Results for orders placed during the hospital encounter of 01/25/18  US Venous Img Lower Bilateral  Narrative CLINICAL DATA:  Bilateral pain and edema  EXAM: BILATERAL LOWER EXTREMITY VENOUS DOPPLER ULTRASOUND  TECHNIQUE: Gray-scale sonography with graded compression, as well as color Doppler and duplex ultrasound were performed to evaluate the lower extremity deep venous systems from the level of the common femoral vein and including the  common femoral, femoral, profunda femoral, popliteal and calf veins including the posterior tibial, peroneal and gastrocnemius veins when visible. The superficial great saphenous vein was also interrogated. Spectral Doppler was utilized to evaluate flow at rest and with distal augmentation maneuvers in the common femoral, femoral and popliteal veins.  COMPARISON:  None.  FINDINGS: RIGHT LOWER EXTREMITY  Common Femoral Vein: No evidence of thrombus. Normal compressibility, respiratory phasicity and response to augmentation.  Saphenofemoral Junction: No evidence of thrombus. Normal compressibility and flow on color Doppler imaging.  Profunda Femoral Vein: No evidence of thrombus. Normal compressibility and flow on color Doppler imaging.  Femoral Vein: No evidence of thrombus. Normal compressibility, respiratory phasicity and response to augmentation.  Popliteal Vein: No evidence of thrombus. Normal compressibility, respiratory phasicity and response to augmentation.  Calf Veins: No evidence of thrombus. Normal compressibility and flow on color Doppler imaging.  Superficial Great Saphenous Vein: No evidence of thrombus. Normal compressibility.  Venous Reflux:  None.  Other Findings:  None.  LEFT LOWER EXTREMITY  Common Femoral Vein: No evidence of thrombus. Normal compressibility, respiratory phasicity and response to augmentation.  Saphenofemoral Junction: No evidence of thrombus. Normal compressibility and flow on color Doppler imaging.  Profunda Femoral Vein: No evidence of thrombus. Normal compressibility and flow on color Doppler imaging.  Femoral Vein: No evidence of thrombus. Normal compressibility, respiratory phasicity and response to augmentation.  Popliteal Vein: No evidence of thrombus. Normal compressibility, respiratory phasicity and response to augmentation.  Calf Veins: No evidence of thrombus. Normal compressibility and flow on color Doppler  imaging.  Superficial Great Saphenous Vein: No evidence of thrombus. Normal compressibility.  Venous Reflux:  None.  Other Findings:  None.  IMPRESSION: No evidence of deep venous thrombosis.   Electronically Signed By: Jerilynn Mages.  Shick M.D. On: 01/27/2018 11:02  No results found for this or any previous visit.  No results found for this or any previous visit.  Results for orders placed during the hospital encounter of 01/25/18  US RENAL  Narrative CLINICAL DATA:  UTI, fever  EXAM: RENAL / URINARY TRACT ULTRASOUND COMPLETE  COMPARISON:  CT 11/25/2017  FINDINGS: Right Kidney:  Length: 11.3 cm. Normal echotexture. No hydronephrosis. 2.3 cm cyst in the midpole.  Left Kidney:  Length: 11.3 cm. Echogenicity within normal limits. No mass or hydronephrosis visualized.  Bladder:  Prior cystectomy.  IMPRESSION: No hydronephrosis or acute findings.   Electronically Signed By: Rolm Baptise M.D. On: 01/27/2018 10:56  No valid procedures specified. No results found for this or any previous visit.  Results for orders placed during the hospital encounter of 05/05/22  CT Renal Stone Study  Narrative CLINICAL DATA:  Flank pain, kidney stone suspected. Pt with blood in urine started this afternoon. Recent with UTI  EXAM: CT ABDOMEN AND PELVIS WITHOUT CONTRAST  TECHNIQUE: Multidetector CT imaging of the abdomen and pelvis was performed following the standard protocol without IV contrast.  RADIATION DOSE REDUCTION: This exam was performed according to the departmental dose-optimization program which includes automated exposure control, adjustment of the mA and/or kV according to patient size and/or use of iterative reconstruction technique.  COMPARISON:  CT abdomen pelvis 04/11/2022  FINDINGS: Lower chest: Trace to small right and trace left pleural effusions. Small hiatal hernia.  Hepatobiliary: Several fluid density lesions within the right hepatic lobe  likely represent simple renal cysts. No pulmonary nodule. No pulmonary mass. No pleural effusion. No pneumothorax.  Pancreas: No focal lesion. Normal pancreatic contour. No surrounding inflammatory changes. No main pancreatic ductal dilatation.  Spleen: Normal in size without focal abnormality.  Adrenals/Urinary Tract:  No adrenal nodule bilaterally.  Persistent mild left hydronephrosis and severe left hydroureter with associated urothelial thickening. No nephroureterolithiasis. No hydronephrosis on the right.  Status post radical cystoprostatectomy with lower quadrant ileal conduit formation.  Stomach/Bowel: Stomach is within normal limits. No evidence of bowel wall thickening or dilatation. Colonic diverticulosis. The appendix is not definitely identified with no inflammatory changes in the right lower quadrant to suggest acute appendicitis.  Vascular/Lymphatic: No abdominal aorta or iliac aneurysm. At least moderate atherosclerotic plaque of the aorta and its branches. No abdominal, pelvic, or inguinal lymphadenopathy.  Reproductive: Prostatectomy.  Other: No intraperitoneal free fluid. No intraperitoneal free gas. No organized fluid collection.  Musculoskeletal:  No abdominal wall hernia or abnormality.  Similar-appearing expansile lytic lesion of the right inferior pubic rami. No acute displaced fracture.  IMPRESSION: 1. Persistent mild left hydronephrosis and severe left hydroureter with associated urothelial thickening in a patient status post radical cystoprostatectomy with lower quadrant ileal conduit formation. This may reflect chronic changes of obstructive uropathy or reflux. Limited evaluation on this noncontrast study. Recommend correlation with urinalysis for infection. 2. Trace to small right and trace left pleural effusions. 3. Colonic diverticulosis with no acute diverticulitis. 4. Similar-appearing expansile lytic lesion of the right inferior pubic  rami. Finding likely represents an osseous metastasis. 5.  Aortic Atherosclerosis (ICD10-I70.0).   Electronically Signed By: Iven Finn M.D. On: 05/05/2022 23:00   Assessment & Plan:    1. Recurrent UTI *** - Urinalysis, Routine w reflex microscopic  2. Pyelonephritis *** - Urinalysis, Routine w reflex microscopic 3 Left hydronephrosis   No follow-ups on file.  Nicolette Bang, MD  [facility] [place]

## 2022-05-10 NOTE — Patient Instructions (Signed)
Hydronephrosis  Hydronephrosis is the swelling of one or both kidneys due to a blockage that stops urine from flowing out of the body. Kidneys filter waste from the blood and produce urine. This condition can lead to kidney failure and may become life-threatening if not treated promptly. What are the causes? In infants and children, common causes include problems that occur when a baby is developing in the womb. These can include problems in the kidneys or in the tubes that drain urine into the bladder (ureters). In adults, common causes include: Kidney stones. Pregnancy. A tumor or cyst in the abdomen or pelvis. An enlarged prostate gland. Other causes include: Bladder infection. Scar tissue from a previous surgery or injury. A blood clot. Cancer of the prostate, bladder, uterus, ovary, or colon. What are the signs or symptoms? Symptoms of this condition include: Pain or discomfort in your side (flank) or abdomen. Swelling in your abdomen. Nausea and vomiting. Fever. Pain when passing urine. Feelings of urgency when you need to urinate. Urinating more often than normal. In some cases, you may not have any symptoms. How is this diagnosed? This condition may be diagnosed based on: Your symptoms and medical history. A physical exam. Blood and urine tests. Imaging tests, such as an ultrasound, CT scan, or MRI. A procedure to look at your urinary tract and bladder by inserting a scope into the urethra (cystoscopy). How is this treated? Treatment for this condition depends on where the blockage is, how long it has been there, and what caused it. The goal of treatment is to remove the blockage. Treatment may include: Antibiotic medicines to treat or prevent infection. A procedure to place a small, thin tube (stent) into a blocked ureter. The stent will keep the ureter open so that urine can drain through it. A nonsurgical procedure that crushes kidney stones with shock waves  (extracorporeal shock wave lithotripsy). If kidney failure occurs, treatment may include dialysis or a kidney transplant. Follow these instructions at home:  Take over-the-counter and prescription medicines only as told by your health care provider. If you were prescribed an antibiotic medicine, take it exactly as told by your health care provider. Do not stop taking the antibiotic even if you start to feel better. Rest and return to your normal activities as told by your health care provider. Ask your health care provider what activities are safe for you. Drink enough fluid to keep your urine pale yellow. Keep all follow-up visits. This is important. Contact a health care provider if: You continue to have symptoms after treatment. You develop new symptoms. Your urine becomes cloudy or bloody. You have a fever. Get help right away if: You have severe flank or abdominal pain. You cannot drink fluids without vomiting. Summary Hydronephrosis is the swelling of one or both kidneys due to a blockage that stops urine from flowing out of the body. Hydronephrosis can lead to kidney failure and may become life-threatening if not treated promptly. The goal of treatment is to remove the blockage. It may include a procedure to insert a stent into a blocked ureter, a procedure to break up kidney stones, or taking antibiotic medicines. Follow your health care provider's instructions for taking care of yourself at home, including instructions about drinking fluids, taking medicines, and limiting activities. This information is not intended to replace advice given to you by your health care provider. Make sure you discuss any questions you have with your health care provider. Document Revised: 11/20/2019 Document Reviewed: 11/20/2019 Elsevier   Patient Education  2023 [employer]  

## 2022-05-11 ENCOUNTER — Telehealth: Payer: Self-pay

## 2022-05-11 DIAGNOSIS — L501 Idiopathic urticaria: Secondary | ICD-10-CM | POA: Diagnosis not present

## 2022-05-11 MED ORDER — AMOXICILLIN 500 MG PO CAPS: 500.0000 mg | ORAL_CAPSULE | Freq: Three times a day (TID) | ORAL | 0 refills | Status: DC

## 2022-05-11 NOTE — Telephone Encounter (Signed)
A fax from ED with culture results, patient was rx'd Levaquin and they recommended Dr. Alyson Ingles send in additional rx for Amox '500mg'$  TID for 5 days.  Verbal from Dr. Alyson Ingles to send in additional rx.  Sent to pharmacy and notified patient.   Pt's wife states Dr. Alyson Ingles told pt to hold his blood thinner until after he finishes the antibiotic, the wife is asking if he needs to continue holding the blood thinner until he finishes the additional abx.  Verbal from Dr. Alyson Ingles to continue holding blood thinner until he finish the amox '500mg'$ .  Wife and patient aware of MD instructions.

## 2022-05-12 ENCOUNTER — Ambulatory Visit (INDEPENDENT_AMBULATORY_CARE_PROVIDER_SITE_OTHER): Payer: Medicare Other

## 2022-05-12 DIAGNOSIS — L501 Idiopathic urticaria: Secondary | ICD-10-CM | POA: Diagnosis not present

## 2022-05-12 LAB — CYTOLOGY, URINE

## 2022-05-17 NOTE — Progress Notes (Signed)
Letter sent.

## 2022-05-19 ENCOUNTER — Ambulatory Visit (INDEPENDENT_AMBULATORY_CARE_PROVIDER_SITE_OTHER): Payer: Medicare Other | Admitting: Urology

## 2022-05-19 VITALS — BP 126/76 | HR 73

## 2022-05-19 DIAGNOSIS — Z87440 Personal history of urinary (tract) infections: Secondary | ICD-10-CM

## 2022-05-19 DIAGNOSIS — C679 Malignant neoplasm of bladder, unspecified: Secondary | ICD-10-CM

## 2022-05-19 DIAGNOSIS — Z8551 Personal history of malignant neoplasm of bladder: Secondary | ICD-10-CM

## 2022-05-19 DIAGNOSIS — N390 Urinary tract infection, site not specified: Secondary | ICD-10-CM

## 2022-05-19 NOTE — Progress Notes (Signed)
05/19/2022 10:18 AM   [name] Jul 01, 1941 [id number]  Referring provider: [name], MD [address] [place],   [address]  Followup bladder cancer and recurrent UTI   HPI: Mr Kingsley is a 81yo here for followup for bladder cancer and recurrent UTI. Urine cytology negative. Patient finished antibiotics yesterday. Urine clear. No hematuria. Patient feels well. He has not gotten his renogram scheduled. No other complaints today   PMH: Past Medical History:  Diagnosis Date   Bladder cancer ([place])    Bone cancer ([place])    BPH (benign prostatic hypertrophy)    Dysuria    GERD (gastroesophageal reflux disease)    History of adenomatous polyp of colon    tubular adenoma 06/ 2018   History of bladder cancer 12/2014   urologist-  dr [name]--  dx High Grade TCC without stromal invasion s/p TURBT and intravesical BCG tx/  recurrent 07/2015 (pTa) TCC  repear intravesical BCG tx    History of hiatal hernia    History of urethral stricture    Nocturia     Surgical History: Past Surgical History:  Procedure Laterality Date   CARDIAC CATHETERIZATION  1997   normal coronary arteries   CARDIOVASCULAR STRESS TEST  12-14-2005   Low risk perfusion study/  very small scar in the inferior septum from mid ventricle to apex,  no ischemia/  mild inferior septal hypokinesis/  ef 58%   CATARACT EXTRACTION W/ INTRAOCULAR LENS  IMPLANT, BILATERAL  2011   COLONOSCOPY  last one 06/ 2018   CYSTOSCOPY WITH BIOPSY N/A 08/12/2015   Procedure: CYSTOSCOPY WITH BIOPSY AND FULGERATION;  Surgeon: [name], MD;  [facility];  Service: Urology;  Laterality: N/A;   CYSTOSCOPY WITH INJECTION N/A 06/29/2017   Procedure: CYSTOSCOPY WITH INJECTION OF INDOCYANINE GREEN DYE;  Surgeon: Alexis Frock, MD;  Location: [facility];  Service: Urology;  Laterality: N/A;   CYSTOSCOPY WITH URETHRAL DILATATION N/A 03/21/2017   Procedure: CYSTOSCOPY;  Surgeon: [name],  MD;  Location: [facility];  Service: Urology;  Laterality: N/A;   ESOPHAGOGASTRODUODENOSCOPY  12-05-2014   IR IMAGING GUIDED PORT INSERTION  02/07/2018   IR NEPHROSTOMY PLACEMENT LEFT  12/25/2021   IR REMOVAL TUN ACCESS W/ PORT W/O FL MOD SED  04/14/2018   TRANSTHORACIC ECHOCARDIOGRAM  01-31-2008   nomral LVF,  ef 55-65%/  mild pulmonic stenosis without regurg.,  peak transpulomonic valve gradient 29mHg   TRANSURETHRAL RESECTION OF BLADDER TUMOR N/A 12/31/2014   Procedure: TRANSURETHRAL RESECTION OF BLADDER TUMOR (TURBT);  Surgeon: MLowella Bandy MD;  [facility]  Service: Urology;  Laterality: N/A;   TRANSURETHRAL RESECTION OF BLADDER TUMOR N/A 03/21/2017   Procedure: POSSIBLE TRANSURETHRAL RESECTION OF BLADDER TUMOR (TURBT);  Surgeon: BNickie Retort MD;  [facility]  Service: Urology;  Laterality: N/A;    Home Medications:  Allergies as of 05/19/2022       Reactions   Ciprofloxacin Hives   Zofran [ondansetron] Rash        Medication List        Accurate as of May 19, 2022 10:18 AM. If you have any questions, ask your nurse or doctor.          acetaminophen 500 MG tablet Commonly known as: TYLENOL Take 1,000 mg by mouth every 6 (six) hours as needed.   amoxicillin 500 MG capsule Commonly known as: AMOXIL Take 1 capsule (500 mg total) by mouth 3 (three)  times daily.   apixaban 5 MG Tabs tablet Commonly known as: ELIQUIS Take 1 tablet (5 mg total) by mouth 2 (two) times daily.   cefpodoxime 200 MG tablet Commonly known as: VANTIN Take 1 tablet (200 mg total) by mouth 2 (two) times daily.   clotrimazole-betamethasone cream Commonly known as: LOTRISONE Apply 1 application topically in the morning and at bedtime.   HYDROmorphone 4 MG tablet Commonly known as: DILAUDID Take 1 tablet (4 mg total) by mouth every 4 (four) hours as needed for severe pain.   lactose free nutrition Liqd Take 237 mLs by  mouth 3 (three) times daily between meals.   levofloxacin 500 MG tablet Commonly known as: LEVAQUIN Take 1 tablet (500 mg total) by mouth daily.   levothyroxine 50 MCG tablet Commonly known as: SYNTHROID Take 50 mcg by mouth daily.   methenamine 1 g tablet Commonly known as: Hiprex Take 1 tablet (1 g total) by mouth 2 (two) times daily with a meal.   metoprolol tartrate 25 MG tablet Commonly known as: LOPRESSOR Take 1 tablet (25 mg total) by mouth 2 (two) times daily.   pantoprazole 40 MG tablet Commonly known as: PROTONIX Take 1 tablet (40 mg total) by mouth daily.   polyethylene glycol 17 g packet Commonly known as: MIRALAX / GLYCOLAX Take 17 g by mouth daily as needed for mild constipation.        Allergies:  Allergies  Allergen Reactions   Ciprofloxacin Hives   Zofran [Ondansetron] Rash    Family History: Family History  Problem Relation Age of Onset   Alcoholism Father    Colon cancer Neg Hx    Colon polyps Neg Hx    Diabetes Neg Hx    Kidney disease Neg Hx    Esophageal cancer Neg Hx    Heart disease Neg Hx    Gallbladder disease Neg Hx    Allergic rhinitis Neg Hx    Angioedema Neg Hx    Asthma Neg Hx    Atopy Neg Hx    Eczema Neg Hx    Immunodeficiency Neg Hx    Urticaria Neg Hx     Social History:  reports that he quit smoking about 33 years ago. His smoking use included cigarettes. He has a 34.00 pack-year smoking history. He has never used smokeless tobacco. He reports current alcohol use of about 6.0 standard drinks of alcohol per week. He reports that he does not use drugs.  ROS: All other review of systems were reviewed and are negative except what is noted above in HPI  Physical Exam: BP 126/76   Pulse 73   Constitutional:  Alert and oriented, No acute distress. HEENT: [place] AT, moist mucus membranes.  Trachea midline, no masses. Cardiovascular: No clubbing, cyanosis, or edema. Respiratory: Normal respiratory effort, no increased work of  breathing. GI: Abdomen is soft, nontender, nondistended, no abdominal masses GU: No CVA tenderness.  Lymph: No cervical or inguinal lymphadenopathy. Skin: No rashes, bruises or suspicious lesions. Neurologic: Grossly intact, no focal deficits, moving all 4 extremities. Psychiatric: Normal mood and affect.  Laboratory Data: Lab Results  Component Value Date   WBC 12.4 (H) 05/05/2022   HGB 14.2 05/05/2022   HCT 41.7 05/05/2022   MCV 104.3 (H) 05/05/2022   PLT 159 05/05/2022    Lab Results  Component Value Date   CREATININE 0.94 05/05/2022    No results found for: "PSA"  No results found for: "TESTOSTERONE"  Lab Results  Component Value Date  HGBA1C 5.1 06/16/2020    Urinalysis    Component Value Date/Time   COLORURINE RED (A) 05/05/2022 2048   APPEARANCEUR HAZY (A) 05/05/2022 2048   APPEARANCEUR Hazy (A) 04/23/2022 1107   LABSPEC 1.009 05/05/2022 2048   PHURINE 6.0 05/05/2022 2048   GLUCOSEU NEGATIVE 05/05/2022 2048   HGBUR LARGE (A) 05/05/2022 2048   BILIRUBINUR NEGATIVE 05/05/2022 2048   BILIRUBINUR Negative 04/23/2022 1107   KETONESUR NEGATIVE 05/05/2022 2048   PROTEINUR 100 (A) 05/05/2022 2048   NITRITE NEGATIVE 05/05/2022 2048   LEUKOCYTESUR NEGATIVE 05/05/2022 2048    Lab Results  Component Value Date   LABMICR See below: 04/23/2022   WBCUA >30 (A) 04/23/2022   LABEPIT 0-10 04/23/2022   MUCUS Present 04/23/2022   BACTERIA RARE (A) 05/05/2022    Pertinent Imaging:  No results found for this or any previous visit.  Results for orders placed during the hospital encounter of 01/25/18  US Venous Img Lower Bilateral  Narrative CLINICAL DATA:  Bilateral pain and edema  EXAM: BILATERAL LOWER EXTREMITY VENOUS DOPPLER ULTRASOUND  TECHNIQUE: Gray-scale sonography with graded compression, as well as color Doppler and duplex ultrasound were performed to evaluate the lower extremity deep venous systems from the level of the common femoral vein and  including the common femoral, femoral, profunda femoral, popliteal and calf veins including the posterior tibial, peroneal and gastrocnemius veins when visible. The superficial great saphenous vein was also interrogated. Spectral Doppler was utilized to evaluate flow at rest and with distal augmentation maneuvers in the common femoral, femoral and popliteal veins.  COMPARISON:  None.  FINDINGS: RIGHT LOWER EXTREMITY  Common Femoral Vein: No evidence of thrombus. Normal compressibility, respiratory phasicity and response to augmentation.  Saphenofemoral Junction: No evidence of thrombus. Normal compressibility and flow on color Doppler imaging.  Profunda Femoral Vein: No evidence of thrombus. Normal compressibility and flow on color Doppler imaging.  Femoral Vein: No evidence of thrombus. Normal compressibility, respiratory phasicity and response to augmentation.  Popliteal Vein: No evidence of thrombus. Normal compressibility, respiratory phasicity and response to augmentation.  Calf Veins: No evidence of thrombus. Normal compressibility and flow on color Doppler imaging.  Superficial Great Saphenous Vein: No evidence of thrombus. Normal compressibility.  Venous Reflux:  None.  Other Findings:  None.  LEFT LOWER EXTREMITY  Common Femoral Vein: No evidence of thrombus. Normal compressibility, respiratory phasicity and response to augmentation.  Saphenofemoral Junction: No evidence of thrombus. Normal compressibility and flow on color Doppler imaging.  Profunda Femoral Vein: No evidence of thrombus. Normal compressibility and flow on color Doppler imaging.  Femoral Vein: No evidence of thrombus. Normal compressibility, respiratory phasicity and response to augmentation.  Popliteal Vein: No evidence of thrombus. Normal compressibility, respiratory phasicity and response to augmentation.  Calf Veins: No evidence of thrombus. Normal compressibility and flow on color  Doppler imaging.  Superficial Great Saphenous Vein: No evidence of thrombus. Normal compressibility.  Venous Reflux:  None.  Other Findings:  None.  IMPRESSION: No evidence of deep venous thrombosis.   Electronically Signed By: Jerilynn Mages.  Shick M.D. On: 01/27/2018 11:02  No results found for this or any previous visit.  No results found for this or any previous visit.  Results for orders placed during the hospital encounter of 01/25/18  US RENAL  Narrative CLINICAL DATA:  UTI, fever  EXAM: RENAL / URINARY TRACT ULTRASOUND COMPLETE  COMPARISON:  CT 11/25/2017  FINDINGS: Right Kidney:  Length: 11.3 cm. Normal echotexture. No hydronephrosis. 2.3 cm cyst in the midpole.  Left Kidney:  Length: 11.3 cm. Echogenicity within normal limits. No mass or hydronephrosis visualized.  Bladder:  Prior cystectomy.  IMPRESSION: No hydronephrosis or acute findings.   Electronically Signed By: Rolm Baptise M.D. On: 01/27/2018 10:56  No valid procedures specified. No results found for this or any previous visit.  Results for orders placed during the hospital encounter of 05/05/22  CT Renal Stone Study  Narrative CLINICAL DATA:  Flank pain, kidney stone suspected. Pt with blood in urine started this afternoon. Recent with UTI  EXAM: CT ABDOMEN AND PELVIS WITHOUT CONTRAST  TECHNIQUE: Multidetector CT imaging of the abdomen and pelvis was performed following the standard protocol without IV contrast.  RADIATION DOSE REDUCTION: This exam was performed according to the departmental dose-optimization program which includes automated exposure control, adjustment of the mA and/or kV according to patient size and/or use of iterative reconstruction technique.  COMPARISON:  CT abdomen pelvis 04/11/2022  FINDINGS: Lower chest: Trace to small right and trace left pleural effusions. Small hiatal hernia.  Hepatobiliary: Several fluid density lesions within the right hepatic  lobe likely represent simple renal cysts. No pulmonary nodule. No pulmonary mass. No pleural effusion. No pneumothorax.  Pancreas: No focal lesion. Normal pancreatic contour. No surrounding inflammatory changes. No main pancreatic ductal dilatation.  Spleen: Normal in size without focal abnormality.  Adrenals/Urinary Tract:  No adrenal nodule bilaterally.  Persistent mild left hydronephrosis and severe left hydroureter with associated urothelial thickening. No nephroureterolithiasis. No hydronephrosis on the right.  Status post radical cystoprostatectomy with lower quadrant ileal conduit formation.  Stomach/Bowel: Stomach is within normal limits. No evidence of bowel wall thickening or dilatation. Colonic diverticulosis. The appendix is not definitely identified with no inflammatory changes in the right lower quadrant to suggest acute appendicitis.  Vascular/Lymphatic: No abdominal aorta or iliac aneurysm. At least moderate atherosclerotic plaque of the aorta and its branches. No abdominal, pelvic, or inguinal lymphadenopathy.  Reproductive: Prostatectomy.  Other: No intraperitoneal free fluid. No intraperitoneal free gas. No organized fluid collection.  Musculoskeletal:  No abdominal wall hernia or abnormality.  Similar-appearing expansile lytic lesion of the right inferior pubic rami. No acute displaced fracture.  IMPRESSION: 1. Persistent mild left hydronephrosis and severe left hydroureter with associated urothelial thickening in a patient status post radical cystoprostatectomy with lower quadrant ileal conduit formation. This may reflect chronic changes of obstructive uropathy or reflux. Limited evaluation on this noncontrast study. Recommend correlation with urinalysis for infection. 2. Trace to small right and trace left pleural effusions. 3. Colonic diverticulosis with no acute diverticulitis. 4. Similar-appearing expansile lytic lesion of the right  inferior pubic rami. Finding likely represents an osseous metastasis. 5.  Aortic Atherosclerosis (ICD10-I70.0).   Electronically Signed By: Iven Finn M.D. On: 05/05/2022 23:00   Assessment & Plan:    1. Recurrent UTI -continue hiprex  2. Malignant neoplasm of urinary bladder, unspecified site [facility]) -RTC 1 year with cytology   No follow-ups on file.  Nicolette Bang, MD  [facility] [place]

## 2022-05-21 DIAGNOSIS — L501 Idiopathic urticaria: Secondary | ICD-10-CM | POA: Diagnosis not present

## 2022-05-24 ENCOUNTER — Ambulatory Visit (INDEPENDENT_AMBULATORY_CARE_PROVIDER_SITE_OTHER): Payer: Medicare Other

## 2022-05-24 DIAGNOSIS — L501 Idiopathic urticaria: Secondary | ICD-10-CM

## 2022-05-27 ENCOUNTER — Encounter: Payer: Self-pay | Admitting: Urology

## 2022-05-27 NOTE — Patient Instructions (Signed)
Hydronephrosis  Hydronephrosis is the swelling of one or both kidneys due to a blockage that stops urine from flowing out of the body. Kidneys filter waste from the blood and produce urine. This condition can lead to kidney failure and may become life-threatening if not treated promptly. What are the causes? In infants and children, common causes include problems that occur when a baby is developing in the womb. These can include problems in the kidneys or in the tubes that drain urine into the bladder (ureters). In adults, common causes include: Kidney stones. Pregnancy. A tumor or cyst in the abdomen or pelvis. An enlarged prostate gland. Other causes include: Bladder infection. Scar tissue from a previous surgery or injury. A blood clot. Cancer of the prostate, bladder, uterus, ovary, or colon. What are the signs or symptoms? Symptoms of this condition include: Pain or discomfort in your side (flank) or abdomen. Swelling in your abdomen. Nausea and vomiting. Fever. Pain when passing urine. Feelings of urgency when you need to urinate. Urinating more often than normal. In some cases, you may not have any symptoms. How is this diagnosed? This condition may be diagnosed based on: Your symptoms and medical history. A physical exam. Blood and urine tests. Imaging tests, such as an ultrasound, CT scan, or MRI. A procedure to look at your urinary tract and bladder by inserting a scope into the urethra (cystoscopy). How is this treated? Treatment for this condition depends on where the blockage is, how long it has been there, and what caused it. The goal of treatment is to remove the blockage. Treatment may include: Antibiotic medicines to treat or prevent infection. A procedure to place a small, thin tube (stent) into a blocked ureter. The stent will keep the ureter open so that urine can drain through it. A nonsurgical procedure that crushes kidney stones with shock waves  (extracorporeal shock wave lithotripsy). If kidney failure occurs, treatment may include dialysis or a kidney transplant. Follow these instructions at home:  Take over-the-counter and prescription medicines only as told by your health care provider. If you were prescribed an antibiotic medicine, take it exactly as told by your health care provider. Do not stop taking the antibiotic even if you start to feel better. Rest and return to your normal activities as told by your health care provider. Ask your health care provider what activities are safe for you. Drink enough fluid to keep your urine pale yellow. Keep all follow-up visits. This is important. Contact a health care provider if: You continue to have symptoms after treatment. You develop new symptoms. Your urine becomes cloudy or bloody. You have a fever. Get help right away if: You have severe flank or abdominal pain. You cannot drink fluids without vomiting. Summary Hydronephrosis is the swelling of one or both kidneys due to a blockage that stops urine from flowing out of the body. Hydronephrosis can lead to kidney failure and may become life-threatening if not treated promptly. The goal of treatment is to remove the blockage. It may include a procedure to insert a stent into a blocked ureter, a procedure to break up kidney stones, or taking antibiotic medicines. Follow your health care provider's instructions for taking care of yourself at home, including instructions about drinking fluids, taking medicines, and limiting activities. This information is not intended to replace advice given to you by your health care provider. Make sure you discuss any questions you have with your health care provider. Document Revised: 11/20/2019 Document Reviewed: 11/20/2019 Elsevier   Patient Education  2023 [employer]  

## 2022-06-01 ENCOUNTER — Ambulatory Visit: Payer: Medicare Other | Attending: Internal Medicine | Admitting: Internal Medicine

## 2022-06-01 ENCOUNTER — Encounter: Payer: Self-pay | Admitting: Internal Medicine

## 2022-06-01 VITALS — BP 118/62 | HR 84 | Ht 71.0 in | Wt 194.0 lb

## 2022-06-01 DIAGNOSIS — I480 Paroxysmal atrial fibrillation: Secondary | ICD-10-CM | POA: Diagnosis not present

## 2022-06-01 NOTE — Patient Instructions (Signed)
Medication Instructions:   Restart Eliquis 5 mg Two Times Daily on Tomorrow   *If you need a refill on your cardiac medications before your next appointment, please call your pharmacy*   Lab Work: NONE   If you have labs (blood work) drawn today and your tests are completely normal, you will receive your results only by: [place] (if you have MyChart) OR A paper copy in the mail If you have any lab test that is abnormal or we need to change your treatment, we will call you to review the results.   Testing/Procedures: NONE    Follow-Up: At [facility], you and your health needs are our priority.  As part of our continuing mission to provide you with exceptional heart care, we have created designated Provider Care Teams.  These Care Teams include your primary Cardiologist (physician) and Advanced Practice Providers (APPs -  Physician Assistants and Nurse Practitioners) who all work together to provide you with the care you need, when you need it.  We recommend signing up for the patient portal called "MyChart".  Sign up information is provided on this After Visit Summary.  MyChart is used to connect with patients for Virtual Visits (Telemedicine).  Patients are able to view lab/test results, encounter notes, upcoming appointments, etc.  Non-urgent messages can be sent to your provider as well.   To learn more about what you can do with MyChart, go to [email or web address].    Your next appointment:   4 month(s)  The format for your next appointment:   In Person  Provider:   Cristopher Peru, MD    Other Instructions Thank you for choosing [facility]!    Important Information About Sugar

## 2022-06-01 NOTE — Progress Notes (Signed)
HPI [name] is referred by Dr. Wynetta Emery for evaluation of atrial fib with a CVR. He is a pleasant 81 yo man with bladder CA who was found to have atrial fib with a CVR. He was asymptomatic. He was started on eliquis and after about a month developed hematuria and was treated for a UTI. His hematuria resolved. He feels well and cannot tell when he is in vs out of rhythm. He denies chest pain, sob or syncope.  Allergies  Allergen Reactions   Ciprofloxacin Hives   Zofran [Ondansetron] Rash     Current Outpatient Medications  Medication Sig Dispense Refill   acetaminophen (TYLENOL) 500 MG tablet Take 1,000 mg by mouth every 6 (six) hours as needed.     clotrimazole-betamethasone (LOTRISONE) cream Apply 1 application  topically in the morning and at bedtime.     HYDROmorphone (DILAUDID) 4 MG tablet Take 1 tablet (4 mg total) by mouth every 4 (four) hours as needed for severe pain. 120 tablet 0   lactose free nutrition (BOOST) LIQD Take 237 mLs by mouth 3 (three) times daily between meals.      levothyroxine (SYNTHROID) 50 MCG tablet Take 50 mcg by mouth daily.     methenamine (HIPREX) 1 g tablet Take 1 tablet (1 g total) by mouth 2 (two) times daily with a meal. 60 tablet 3   metoprolol tartrate (LOPRESSOR) 25 MG tablet Take 1 tablet (25 mg total) by mouth 2 (two) times daily. 60 tablet 2   pantoprazole (PROTONIX) 40 MG tablet Take 1 tablet (40 mg total) by mouth daily. 30 tablet 1   polyethylene glycol (MIRALAX / GLYCOLAX) 17 g packet Take 17 g by mouth daily as needed for mild constipation. 14 each 0   apixaban (ELIQUIS) 5 MG TABS tablet Take 1 tablet (5 mg total) by mouth 2 (two) times daily. (Patient not taking: Reported on 06/01/2022) 60 tablet 2   doxycycline (VIBRA-TABS) 100 MG tablet Take 100 mg by mouth 2 (two) times daily.     Current Facility-Administered Medications  Medication Dose Route Frequency Provider Last Rate Last Admin   omalizumab Arvid Right) prefilled syringe 300 mg   300 mg Subcutaneous Q14 Days Valentina Shaggy, MD   300 mg at 05/24/22 4401     Past Medical History:  Diagnosis Date   Bladder cancer ([place])    Bone cancer ([place])    BPH (benign prostatic hypertrophy)    Dysuria    GERD (gastroesophageal reflux disease)    History of adenomatous polyp of colon    tubular adenoma 06/ 2018   History of bladder cancer 12/2014   urologist-  dr [name]--  dx High Grade TCC without stromal invasion s/p TURBT and intravesical BCG tx/  recurrent 07/2015 (pTa) TCC  repear intravesical BCG tx    History of hiatal hernia    History of urethral stricture    Nocturia     ROS:   All systems reviewed and negative except as noted in the HPI.   Past Surgical History:  Procedure Laterality Date   CARDIAC CATHETERIZATION  1997   normal coronary arteries   CARDIOVASCULAR STRESS TEST  12-14-2005   Low risk perfusion study/  very small scar in the inferior septum from mid ventricle to apex,  no ischemia/  mild inferior septal hypokinesis/  ef 58%   CATARACT EXTRACTION W/ INTRAOCULAR LENS  IMPLANT, BILATERAL  2011   COLONOSCOPY  last one 06/ 2018   CYSTOSCOPY WITH  BIOPSY N/A 08/12/2015   Procedure: CYSTOSCOPY WITH BIOPSY AND FULGERATION;  Surgeon: [name], MD;  Location: [facility];  Service: Urology;  Laterality: N/A;   CYSTOSCOPY WITH INJECTION N/A 06/29/2017   Procedure: CYSTOSCOPY WITH INJECTION OF INDOCYANINE GREEN DYE;  Surgeon: Alexis Frock, MD;  [facility];  Service: Urology;  Laterality: N/A;   CYSTOSCOPY WITH URETHRAL DILATATION N/A 03/21/2017   Procedure: CYSTOSCOPY;  Surgeon: [name], MD;  Location: [facility];  Service: Urology;  Laterality: N/A;   ESOPHAGOGASTRODUODENOSCOPY  12-05-2014   IR IMAGING GUIDED PORT INSERTION  02/07/2018   IR NEPHROSTOMY PLACEMENT LEFT  12/25/2021   IR REMOVAL TUN ACCESS W/ PORT W/O FL MOD SED  04/14/2018   TRANSTHORACIC ECHOCARDIOGRAM  01-31-2008   nomral  LVF,  ef 55-65%/  mild pulmonic stenosis without regurg.,  peak transpulomonic valve gradient 41mHg   TRANSURETHRAL RESECTION OF BLADDER TUMOR N/A 12/31/2014   Procedure: TRANSURETHRAL RESECTION OF BLADDER TUMOR (TURBT);  Surgeon: MLowella Bandy MD;  [facility]  Service: Urology;  Laterality: N/A;   TRANSURETHRAL RESECTION OF BLADDER TUMOR N/A 03/21/2017   Procedure: POSSIBLE TRANSURETHRAL RESECTION OF BLADDER TUMOR (TURBT);  Surgeon: BNickie Retort MD;  [facility]  Service: Urology;  Laterality: N/A;     Family History  Problem Relation Age of Onset   Alcoholism Father    Colon cancer Neg Hx    Colon polyps Neg Hx    Diabetes Neg Hx    Kidney disease Neg Hx    Esophageal cancer Neg Hx    Heart disease Neg Hx    Gallbladder disease Neg Hx    Allergic rhinitis Neg Hx    Angioedema Neg Hx    Asthma Neg Hx    Atopy Neg Hx    Eczema Neg Hx    Immunodeficiency Neg Hx    Urticaria Neg Hx      Social History   Socioeconomic History   Marital status: Married    Spouse name: Not on file   Number of children: 1   Years of education: Not on file   Highest education level: Not on file  Occupational History   Occupation: Retired  Tobacco Use   Smoking status: Former    Packs/day: 1.00    Years: 34.00    Total pack years: 34.00    Types: Cigarettes    Quit date: 12/26/1988    Years since quitting: 33.4   Smokeless tobacco: Never  Vaping Use   Vaping Use: Never used  Substance and Sexual Activity   Alcohol use: Yes    Alcohol/week: 6.0 standard drinks of alcohol    Types: 6 Cans of beer per week    Comment: BEER   Drug use: No   Sexual activity: Never  Other Topics Concern   Not on file  Social History Narrative   Not on file   Social Determinants of Health   Financial Resource Strain: Not on file  Food Insecurity: Not on file  Transportation Needs: Not on file  Physical Activity: Not on file  Stress: Not on file   Social Connections: Not on file  Intimate Partner Violence: Not on file     BP 118/62   Pulse 84   Ht '5\' 11"'$  (1.803 m)   Wt 194 lb (88 kg)   SpO2 99%   BMI 27.06 kg/m   Physical Exam:  Well appearing NAD HEENT: Unremarkable Neck:  No JVD, no thyromegally Lymphatics:  No adenopathy Back:  No CVA tenderness Lungs:  Clear HEART:  Regular rate rhythm, no murmurs, no rubs, no clicks Abd:  soft, positive bowel sounds, no organomegally, no rebound, no guarding. Urostomy bag in place with yellow urine Ext:  2 plus pulses, no edema, no cyanosis, no clubbing Skin:  No rashes no nodules Neuro:  CN II through XII intact, motor grossly intact  Assess/Plan:  PAF - on exam he is back in rhythm. I asked him to start his eliquis back tomorrow. If he has more hematuria, he might not be a candidate for eliquis. His age may make him relatively contraindicated for watchman. No indication for AA drug at this time as he is asymptomatic.  Carleene Overlie Bindi Klomp,MD

## 2022-06-02 ENCOUNTER — Encounter (HOSPITAL_COMMUNITY): Payer: Self-pay

## 2022-06-02 ENCOUNTER — Encounter (HOSPITAL_COMMUNITY)
Admission: RE | Admit: 2022-06-02 | Discharge: 2022-06-02 | Disposition: A | Discharge status: Home / Self Care | Payer: Medicare Other | Source: Ambulatory Visit | Attending: Urology | Admitting: Urology

## 2022-06-02 DIAGNOSIS — N1330 Unspecified hydronephrosis: Secondary | ICD-10-CM | POA: Insufficient documentation

## 2022-06-02 MED ORDER — FUROSEMIDE 10 MG/ML IJ SOLN: 40.0000 mg | Freq: Once | INTRAMUSCULAR | Status: AC

## 2022-06-02 MED ORDER — TECHNETIUM TC 99M MERTIATIDE
5.0000 | Freq: Once | INTRAVENOUS | Status: AC | PRN
  Administered 2022-06-02: 5.1 via INTRAVENOUS

## 2022-06-02 MED ORDER — FUROSEMIDE 10 MG/ML IJ SOLN
INTRAMUSCULAR | Status: AC
  Administered 2022-06-02: 40 mg via INTRAVENOUS
  Filled 2022-06-02: qty 4

## 2022-06-04 DIAGNOSIS — L501 Idiopathic urticaria: Secondary | ICD-10-CM | POA: Diagnosis not present

## 2022-06-07 ENCOUNTER — Ambulatory Visit (INDEPENDENT_AMBULATORY_CARE_PROVIDER_SITE_OTHER): Payer: Medicare Other | Admitting: *Deleted

## 2022-06-07 DIAGNOSIS — L501 Idiopathic urticaria: Secondary | ICD-10-CM

## 2022-06-15 DIAGNOSIS — I10 Essential (primary) hypertension: Secondary | ICD-10-CM | POA: Diagnosis not present

## 2022-06-15 DIAGNOSIS — G894 Chronic pain syndrome: Secondary | ICD-10-CM | POA: Diagnosis not present

## 2022-06-16 ENCOUNTER — Encounter: Payer: Self-pay | Admitting: Urology

## 2022-06-16 ENCOUNTER — Ambulatory Visit (INDEPENDENT_AMBULATORY_CARE_PROVIDER_SITE_OTHER): Payer: Medicare Other | Admitting: Urology

## 2022-06-16 ENCOUNTER — Other Ambulatory Visit (HOSPITAL_COMMUNITY): Payer: Self-pay | Admitting: Internal Medicine

## 2022-06-16 VITALS — BP 109/73 | HR 108

## 2022-06-16 DIAGNOSIS — N390 Urinary tract infection, site not specified: Secondary | ICD-10-CM

## 2022-06-16 DIAGNOSIS — Z87440 Personal history of urinary (tract) infections: Secondary | ICD-10-CM | POA: Diagnosis not present

## 2022-06-16 DIAGNOSIS — J209 Acute bronchitis, unspecified: Secondary | ICD-10-CM | POA: Diagnosis not present

## 2022-06-16 DIAGNOSIS — E663 Overweight: Secondary | ICD-10-CM | POA: Diagnosis not present

## 2022-06-16 DIAGNOSIS — C679 Malignant neoplasm of bladder, unspecified: Secondary | ICD-10-CM

## 2022-06-16 DIAGNOSIS — R059 Cough, unspecified: Secondary | ICD-10-CM | POA: Diagnosis not present

## 2022-06-16 DIAGNOSIS — Z6827 Body mass index (BMI) 27.0-27.9, adult: Secondary | ICD-10-CM | POA: Diagnosis not present

## 2022-06-16 DIAGNOSIS — R0600 Dyspnea, unspecified: Secondary | ICD-10-CM | POA: Diagnosis not present

## 2022-06-16 DIAGNOSIS — I10 Essential (primary) hypertension: Secondary | ICD-10-CM | POA: Diagnosis not present

## 2022-06-16 DIAGNOSIS — Z8551 Personal history of malignant neoplasm of bladder: Secondary | ICD-10-CM | POA: Diagnosis not present

## 2022-06-16 MED ORDER — METHENAMINE HIPPURATE 1 G PO TABS: 1.0000 g | ORAL_TABLET | Freq: Two times a day (BID) | ORAL | 11 refills | Status: DC

## 2022-06-16 NOTE — Patient Instructions (Signed)
Urinary Tract Infection, Adult  A urinary tract infection (UTI) is an infection of any part of the urinary tract. The urinary tract includes the kidneys, ureters, bladder, and urethra. These organs make, store, and get rid of urine in the body. An upper UTI affects the ureters and kidneys. A lower UTI affects the bladder and urethra. What are the causes? Most urinary tract infections are caused by bacteria in your genital area around your urethra, where urine leaves your body. These bacteria grow and cause inflammation of your urinary tract. What increases the risk? You are more likely to develop this condition if: You have a urinary catheter that stays in place. You are not able to control when you urinate or have a bowel movement (incontinence). You are male and you: Use a spermicide or diaphragm for birth control. Have low estrogen levels. Are pregnant. You have certain genes that increase your risk. You are sexually active. You take antibiotic medicines. You have a condition that causes your flow of urine to slow down, such as: An enlarged prostate, if you are male. Blockage in your urethra. A kidney stone. A nerve condition that affects your bladder control (neurogenic bladder). Not getting enough to drink, or not urinating often. You have certain medical conditions, such as: Diabetes. A weak disease-fighting system (immunesystem). Sickle cell disease. Gout. Spinal cord injury. What are the signs or symptoms? Symptoms of this condition include: Needing to urinate right away (urgency). Frequent urination. This may include small amounts of urine each time you urinate. Pain or burning with urination. Blood in the urine. Urine that smells bad or unusual. Trouble urinating. Cloudy urine. Vaginal discharge, if you are male. Pain in the abdomen or the lower back. You may also have: Vomiting or a decreased appetite. Confusion. Irritability or tiredness. A fever or  chills. Diarrhea. The first symptom in older adults may be confusion. In some cases, they may not have any symptoms until the infection has worsened. How is this diagnosed? This condition is diagnosed based on your medical history and a physical exam. You may also have other tests, including: Urine tests. Blood tests. Tests for STIs (sexually transmitted infections). If you have had more than one UTI, a cystoscopy or imaging studies may be done to determine the cause of the infections. How is this treated? Treatment for this condition includes: Antibiotic medicine. Over-the-counter medicines to treat discomfort. Drinking enough water to stay hydrated. If you have frequent infections or have other conditions such as a kidney stone, you may need to see a health care provider who specializes in the urinary tract (urologist). In rare cases, urinary tract infections can cause sepsis. Sepsis is a life-threatening condition that occurs when the body responds to an infection. Sepsis is treated in the hospital with IV antibiotics, fluids, and other medicines. Follow these instructions at home:  Medicines Take over-the-counter and prescription medicines only as told by your health care provider. If you were prescribed an antibiotic medicine, take it as told by your health care provider. Do not stop using the antibiotic even if you start to feel better. General instructions Make sure you: Empty your bladder often and completely. Do not hold urine for long periods of time. Empty your bladder after sex. Wipe from front to back after urinating or having a bowel movement if you are male. Use each tissue only one time when you wipe. Drink enough fluid to keep your urine pale yellow. Keep all follow-up visits. This is important. Contact a health   care provider if: Your symptoms do not get better after 1-2 days. Your symptoms go away and then return. Get help right away if: You have severe pain in  your back or your lower abdomen. You have a fever or chills. You have nausea or vomiting. Summary A urinary tract infection (UTI) is an infection of any part of the urinary tract, which includes the kidneys, ureters, bladder, and urethra. Most urinary tract infections are caused by bacteria in your genital area. Treatment for this condition often includes antibiotic medicines. If you were prescribed an antibiotic medicine, take it as told by your health care provider. Do not stop using the antibiotic even if you start to feel better. Keep all follow-up visits. This is important. This information is not intended to replace advice given to you by your health care provider. Make sure you discuss any questions you have with your health care provider. Document Revised: 03/14/2020 Document Reviewed: 03/14/2020 Elsevier Patient Education  2023 Elsevier Inc.  

## 2022-06-16 NOTE — Progress Notes (Signed)
06/16/2022 10:54 AM   [name] 04-Sep-1940 [id number]  Referring provider: [name], MD [address] [place],  [place] [address]  [place] recurrent UTI   HPI: [name] is a 81yo here for followup for recurrent UTI. MAG3 scan showed normal uptake and normal excretion after lasix. NO UTI since last visit. He is on hiprex 1g BID. Urine clear today. No other complaints today   PMH: Past Medical History:  Diagnosis Date   Bladder cancer ([place])    Bone cancer ([place])    BPH (benign prostatic hypertrophy)    Dysuria    GERD (gastroesophageal reflux disease)    History of adenomatous polyp of colon    tubular adenoma 06/ 2018   History of bladder cancer 12/2014   urologist-  dr [name]--  dx High Grade TCC without stromal invasion s/p TURBT and intravesical BCG tx/  recurrent 07/2015 (pTa) TCC  repear intravesical BCG tx    History of hiatal hernia    History of urethral stricture    Nocturia     Surgical History: Past Surgical History:  Procedure Laterality Date   CARDIAC CATHETERIZATION  1997   normal coronary arteries   CARDIOVASCULAR STRESS TEST  12-14-2005   Low risk perfusion study/  very small scar in the inferior septum from mid ventricle to apex,  no ischemia/  mild inferior septal hypokinesis/  ef 58%   CATARACT EXTRACTION W/ INTRAOCULAR LENS  IMPLANT, BILATERAL  2011   COLONOSCOPY  last one 06/ 2018   CYSTOSCOPY WITH BIOPSY N/A 08/12/2015   Procedure: CYSTOSCOPY WITH BIOPSY AND FULGERATION;  Surgeon: [name], MD;  Location: [facility];  Service: Urology;  Laterality: N/A;   CYSTOSCOPY WITH INJECTION N/A 06/29/2017   Procedure: CYSTOSCOPY WITH INJECTION OF INDOCYANINE GREEN DYE;  Surgeon: Alexis Frock, MD;  Location: [facility];  Service: Urology;  Laterality: N/A;   CYSTOSCOPY WITH URETHRAL DILATATION N/A 03/21/2017   Procedure: CYSTOSCOPY;  Surgeon: [name], MD;  Location: [facility];  Service:  Urology;  Laterality: N/A;   ESOPHAGOGASTRODUODENOSCOPY  12-05-2014   IR IMAGING GUIDED PORT INSERTION  02/07/2018   IR NEPHROSTOMY PLACEMENT LEFT  12/25/2021   IR REMOVAL TUN ACCESS W/ PORT W/O FL MOD SED  04/14/2018   TRANSTHORACIC ECHOCARDIOGRAM  01-31-2008   nomral LVF,  ef 55-65%/  mild pulmonic stenosis without regurg.,  peak transpulomonic valve gradient 50mHg   TRANSURETHRAL RESECTION OF BLADDER TUMOR N/A 12/31/2014   Procedure: TRANSURETHRAL RESECTION OF BLADDER TUMOR (TURBT);  Surgeon: MLowella Bandy MD;  [facility]  Service: Urology;  Laterality: N/A;   TRANSURETHRAL RESECTION OF BLADDER TUMOR N/A 03/21/2017   Procedure: POSSIBLE TRANSURETHRAL RESECTION OF BLADDER TUMOR (TURBT);  Surgeon: BNickie Retort MD;  [facility]  Service: Urology;  Laterality: N/A;    Home Medications:  Allergies as of 06/16/2022       Reactions   Ciprofloxacin Hives   Zofran [ondansetron] Rash        Medication List        Accurate as of June 16, 2022 10:54 AM. If you have any questions, ask your nurse or doctor.          acetaminophen 500 MG tablet Commonly known as: TYLENOL Take 1,000 mg by mouth every 6 (six) hours as needed.   apixaban 5 MG Tabs tablet Commonly known as: ELIQUIS Take 1 tablet (5 mg total) by mouth 2 (two) times daily.  clotrimazole-betamethasone cream Commonly known as: LOTRISONE Apply 1 application  topically in the morning and at bedtime.   doxycycline 100 MG tablet Commonly known as: VIBRA-TABS Take 100 mg by mouth 2 (two) times daily.   HYDROmorphone 4 MG tablet Commonly known as: DILAUDID Take 1 tablet (4 mg total) by mouth every 4 (four) hours as needed for severe pain.   lactose free nutrition Liqd Take 237 mLs by mouth 3 (three) times daily between meals.   levothyroxine 50 MCG tablet Commonly known as: SYNTHROID Take 50 mcg by mouth daily.   methenamine 1 g tablet Commonly known as:  Hiprex Take 1 tablet (1 g total) by mouth 2 (two) times daily with a meal.   metoprolol tartrate 25 MG tablet Commonly known as: LOPRESSOR Take 1 tablet (25 mg total) by mouth 2 (two) times daily.   pantoprazole 40 MG tablet Commonly known as: PROTONIX Take 1 tablet (40 mg total) by mouth daily.   polyethylene glycol 17 g packet Commonly known as: MIRALAX / GLYCOLAX Take 17 g by mouth daily as needed for mild constipation.        Allergies:  Allergies  Allergen Reactions   Ciprofloxacin Hives   Zofran [Ondansetron] Rash    Family History: Family History  Problem Relation Age of Onset   Alcoholism Father    Colon cancer Neg Hx    Colon polyps Neg Hx    Diabetes Neg Hx    Kidney disease Neg Hx    Esophageal cancer Neg Hx    Heart disease Neg Hx    Gallbladder disease Neg Hx    Allergic rhinitis Neg Hx    Angioedema Neg Hx    Asthma Neg Hx    Atopy Neg Hx    Eczema Neg Hx    Immunodeficiency Neg Hx    Urticaria Neg Hx     Social History:  reports that he quit smoking about 33 years ago. His smoking use included cigarettes. He has a 34.00 pack-year smoking history. He has never used smokeless tobacco. He reports current alcohol use of about 6.0 standard drinks of alcohol per week. He reports that he does not use drugs.  ROS: All other review of systems were reviewed and are negative except what is noted above in HPI  Physical Exam: BP 109/73   Pulse (!) 108   Constitutional:  Alert and oriented, No acute distress. HEENT: [place] AT, moist mucus membranes.  Trachea midline, no masses. Cardiovascular: No clubbing, cyanosis, or edema. Respiratory: Normal respiratory effort, no increased work of breathing. GI: Abdomen is soft, nontender, nondistended, no abdominal masses GU: No CVA tenderness.  Lymph: No cervical or inguinal lymphadenopathy. Skin: No rashes, bruises or suspicious lesions. Neurologic: Grossly intact, no focal deficits, moving all 4  extremities. Psychiatric: Normal mood and affect.  Laboratory Data: Lab Results  Component Value Date   WBC 12.4 (H) 05/05/2022   HGB 14.2 05/05/2022   HCT 41.7 05/05/2022   MCV 104.3 (H) 05/05/2022   PLT 159 05/05/2022    Lab Results  Component Value Date   CREATININE 0.94 05/05/2022    No results found for: "PSA"  No results found for: "TESTOSTERONE"  Lab Results  Component Value Date   HGBA1C 5.1 06/16/2020    Urinalysis    Component Value Date/Time   COLORURINE RED (A) 05/05/2022 2048   APPEARANCEUR HAZY (A) 05/05/2022 2048   APPEARANCEUR Hazy (A) 04/23/2022 1107   LABSPEC 1.009 05/05/2022 2048   PHURINE 6.0 05/05/2022  [place] 05/05/2022 2048   HGBUR LARGE (A) 05/05/2022 2048   BILIRUBINUR NEGATIVE 05/05/2022 2048   BILIRUBINUR Negative 04/23/2022 1107   KETONESUR NEGATIVE 05/05/2022 2048   PROTEINUR 100 (A) 05/05/2022 2048   NITRITE NEGATIVE 05/05/2022 2048   LEUKOCYTESUR NEGATIVE 05/05/2022 2048    Lab Results  Component Value Date   LABMICR See below: 04/23/2022   WBCUA >30 (A) 04/23/2022   LABEPIT 0-10 04/23/2022   MUCUS Present 04/23/2022   BACTERIA RARE (A) 05/05/2022    Pertinent Imaging: Lasix renogram: Images reviewed and discussed with the patient  No results found for this or any previous visit.  Results for orders placed during the hospital encounter of 01/25/18  US Venous Img Lower Bilateral  Narrative CLINICAL DATA:  Bilateral pain and edema  EXAM: BILATERAL LOWER EXTREMITY VENOUS DOPPLER ULTRASOUND  TECHNIQUE: Gray-scale sonography with graded compression, as well as color Doppler and duplex ultrasound were performed to evaluate the lower extremity deep venous systems from the level of the common femoral vein and including the common femoral, femoral, profunda femoral, popliteal and calf veins including the posterior tibial, peroneal and gastrocnemius veins when visible. The superficial great saphenous vein  was also interrogated. Spectral Doppler was utilized to evaluate flow at rest and with distal augmentation maneuvers in the common femoral, femoral and popliteal veins.  COMPARISON:  None.  FINDINGS: RIGHT LOWER EXTREMITY  Common Femoral Vein: No evidence of thrombus. Normal compressibility, respiratory phasicity and response to augmentation.  Saphenofemoral Junction: No evidence of thrombus. Normal compressibility and flow on color Doppler imaging.  Profunda Femoral Vein: No evidence of thrombus. Normal compressibility and flow on color Doppler imaging.  Femoral Vein: No evidence of thrombus. Normal compressibility, respiratory phasicity and response to augmentation.  Popliteal Vein: No evidence of thrombus. Normal compressibility, respiratory phasicity and response to augmentation.  Calf Veins: No evidence of thrombus. Normal compressibility and flow on color Doppler imaging.  Superficial Great Saphenous Vein: No evidence of thrombus. Normal compressibility.  Venous Reflux:  None.  Other Findings:  None.  LEFT LOWER EXTREMITY  Common Femoral Vein: No evidence of thrombus. Normal compressibility, respiratory phasicity and response to augmentation.  Saphenofemoral Junction: No evidence of thrombus. Normal compressibility and flow on color Doppler imaging.  Profunda Femoral Vein: No evidence of thrombus. Normal compressibility and flow on color Doppler imaging.  Femoral Vein: No evidence of thrombus. Normal compressibility, respiratory phasicity and response to augmentation.  Popliteal Vein: No evidence of thrombus. Normal compressibility, respiratory phasicity and response to augmentation.  Calf Veins: No evidence of thrombus. Normal compressibility and flow on color Doppler imaging.  Superficial Great Saphenous Vein: No evidence of thrombus. Normal compressibility.  Venous Reflux:  None.  Other Findings:  None.  IMPRESSION: No evidence of deep venous  thrombosis.   Electronically Signed By: Jerilynn Mages.  Shick M.D. On: 01/27/2018 11:02  No results found for this or any previous visit.  No results found for this or any previous visit.  Results for orders placed during the hospital encounter of 01/25/18  US RENAL  Narrative CLINICAL DATA:  UTI, fever  EXAM: RENAL / URINARY TRACT ULTRASOUND COMPLETE  COMPARISON:  CT 11/25/2017  FINDINGS: Right Kidney:  Length: 11.3 cm. Normal echotexture. No hydronephrosis. 2.3 cm cyst in the midpole.  Left Kidney:  Length: 11.3 cm. Echogenicity within normal limits. No mass or hydronephrosis visualized.  Bladder:  Prior cystectomy.  IMPRESSION: No hydronephrosis or acute findings.   Electronically Signed By: Rolm Baptise  M.D. On: 01/27/2018 10:56  No valid procedures specified. No results found for this or any previous visit.  Results for orders placed during the hospital encounter of 05/05/22  CT Renal Stone Study  Narrative CLINICAL DATA:  Flank pain, kidney stone suspected. Pt with blood in urine started this afternoon. Recent with UTI  EXAM: CT ABDOMEN AND PELVIS WITHOUT CONTRAST  TECHNIQUE: Multidetector CT imaging of the abdomen and pelvis was performed following the standard protocol without IV contrast.  RADIATION DOSE REDUCTION: This exam was performed according to the departmental dose-optimization program which includes automated exposure control, adjustment of the mA and/or kV according to patient size and/or use of iterative reconstruction technique.  COMPARISON:  CT abdomen pelvis 04/11/2022  FINDINGS: Lower chest: Trace to small right and trace left pleural effusions. Small hiatal hernia.  Hepatobiliary: Several fluid density lesions within the right hepatic lobe likely represent simple renal cysts. No pulmonary nodule. No pulmonary mass. No pleural effusion. No pneumothorax.  Pancreas: No focal lesion. Normal pancreatic contour. No  surrounding inflammatory changes. No main pancreatic ductal dilatation.  Spleen: Normal in size without focal abnormality.  Adrenals/Urinary Tract:  No adrenal nodule bilaterally.  Persistent mild left hydronephrosis and severe left hydroureter with associated urothelial thickening. No nephroureterolithiasis. No hydronephrosis on the right.  Status post radical cystoprostatectomy with lower quadrant ileal conduit formation.  Stomach/Bowel: Stomach is within normal limits. No evidence of bowel wall thickening or dilatation. Colonic diverticulosis. The appendix is not definitely identified with no inflammatory changes in the right lower quadrant to suggest acute appendicitis.  Vascular/Lymphatic: No abdominal aorta or iliac aneurysm. At least moderate atherosclerotic plaque of the aorta and its branches. No abdominal, pelvic, or inguinal lymphadenopathy.  Reproductive: Prostatectomy.  Other: No intraperitoneal free fluid. No intraperitoneal free gas. No organized fluid collection.  Musculoskeletal:  No abdominal wall hernia or abnormality.  Similar-appearing expansile lytic lesion of the right inferior pubic rami. No acute displaced fracture.  IMPRESSION: 1. Persistent mild left hydronephrosis and severe left hydroureter with associated urothelial thickening in a patient status post radical cystoprostatectomy with lower quadrant ileal conduit formation. This may reflect chronic changes of obstructive uropathy or reflux. Limited evaluation on this noncontrast study. Recommend correlation with urinalysis for infection. 2. Trace to small right and trace left pleural effusions. 3. Colonic diverticulosis with no acute diverticulitis. 4. Similar-appearing expansile lytic lesion of the right inferior pubic rami. Finding likely represents an osseous metastasis. 5.  Aortic Atherosclerosis (ICD10-I70.0).   Electronically Signed By: Iven Finn M.D. On: 05/05/2022  23:00   Assessment & Plan:    1. Malignant neoplasm of urinary bladder, unspecified site ([place]) -RTC 1 year with cytology  2. Recurrent UTI We will continue hiprex 1g BID   No follow-ups on file.  Nicolette Bang, MD  [facility] [place]

## 2022-06-18 ENCOUNTER — Ambulatory Visit (HOSPITAL_COMMUNITY)
Admission: RE | Admit: 2022-06-18 | Discharge: 2022-06-18 | Disposition: A | Discharge status: Home / Self Care | Payer: Medicare Other | Source: Ambulatory Visit | Attending: Internal Medicine | Admitting: Internal Medicine

## 2022-06-18 DIAGNOSIS — R0600 Dyspnea, unspecified: Secondary | ICD-10-CM | POA: Diagnosis not present

## 2022-06-18 DIAGNOSIS — R059 Cough, unspecified: Secondary | ICD-10-CM | POA: Diagnosis not present

## 2022-06-18 DIAGNOSIS — J90 Pleural effusion, not elsewhere classified: Secondary | ICD-10-CM | POA: Diagnosis not present

## 2022-06-18 DIAGNOSIS — R0602 Shortness of breath: Secondary | ICD-10-CM | POA: Diagnosis not present

## 2022-06-21 ENCOUNTER — Observation Stay (HOSPITAL_COMMUNITY): Payer: Medicare Other

## 2022-06-21 ENCOUNTER — Emergency Department (HOSPITAL_COMMUNITY): Payer: Medicare Other

## 2022-06-21 ENCOUNTER — Encounter (HOSPITAL_COMMUNITY): Payer: Self-pay | Admitting: *Deleted

## 2022-06-21 ENCOUNTER — Observation Stay (HOSPITAL_COMMUNITY)
Admission: EM | Admit: 2022-06-21 | Discharge: 2022-06-22 | Disposition: A | Discharge status: Home / Self Care | Payer: Medicare Other | Attending: Internal Medicine | Admitting: Internal Medicine

## 2022-06-21 ENCOUNTER — Other Ambulatory Visit: Payer: Self-pay

## 2022-06-21 ENCOUNTER — Ambulatory Visit: Payer: Medicare Other

## 2022-06-21 DIAGNOSIS — R2681 Unsteadiness on feet: Secondary | ICD-10-CM | POA: Insufficient documentation

## 2022-06-21 DIAGNOSIS — Z87891 Personal history of nicotine dependence: Secondary | ICD-10-CM | POA: Diagnosis not present

## 2022-06-21 DIAGNOSIS — E039 Hypothyroidism, unspecified: Secondary | ICD-10-CM | POA: Insufficient documentation

## 2022-06-21 DIAGNOSIS — C7951 Secondary malignant neoplasm of bone: Secondary | ICD-10-CM | POA: Diagnosis not present

## 2022-06-21 DIAGNOSIS — I480 Paroxysmal atrial fibrillation: Secondary | ICD-10-CM | POA: Diagnosis not present

## 2022-06-21 DIAGNOSIS — Z6828 Body mass index (BMI) 28.0-28.9, adult: Secondary | ICD-10-CM | POA: Diagnosis not present

## 2022-06-21 DIAGNOSIS — R0602 Shortness of breath: Secondary | ICD-10-CM

## 2022-06-21 DIAGNOSIS — Z79890 Hormone replacement therapy (postmenopausal): Secondary | ICD-10-CM | POA: Insufficient documentation

## 2022-06-21 DIAGNOSIS — Z79899 Other long term (current) drug therapy: Secondary | ICD-10-CM | POA: Insufficient documentation

## 2022-06-21 DIAGNOSIS — R531 Weakness: Secondary | ICD-10-CM

## 2022-06-21 DIAGNOSIS — R911 Solitary pulmonary nodule: Secondary | ICD-10-CM | POA: Diagnosis not present

## 2022-06-21 DIAGNOSIS — C7800 Secondary malignant neoplasm of unspecified lung: Secondary | ICD-10-CM | POA: Diagnosis not present

## 2022-06-21 DIAGNOSIS — C679 Malignant neoplasm of bladder, unspecified: Principal | ICD-10-CM | POA: Diagnosis present

## 2022-06-21 DIAGNOSIS — M6281 Muscle weakness (generalized): Secondary | ICD-10-CM | POA: Insufficient documentation

## 2022-06-21 DIAGNOSIS — J90 Pleural effusion, not elsewhere classified: Secondary | ICD-10-CM | POA: Diagnosis not present

## 2022-06-21 DIAGNOSIS — Z1152 Encounter for screening for COVID-19: Secondary | ICD-10-CM | POA: Insufficient documentation

## 2022-06-21 DIAGNOSIS — R059 Cough, unspecified: Secondary | ICD-10-CM | POA: Diagnosis not present

## 2022-06-21 DIAGNOSIS — J9811 Atelectasis: Secondary | ICD-10-CM | POA: Diagnosis not present

## 2022-06-21 DIAGNOSIS — Z7901 Long term (current) use of anticoagulants: Secondary | ICD-10-CM | POA: Insufficient documentation

## 2022-06-21 DIAGNOSIS — Z23 Encounter for immunization: Secondary | ICD-10-CM | POA: Insufficient documentation

## 2022-06-21 DIAGNOSIS — J948 Other specified pleural conditions: Secondary | ICD-10-CM | POA: Diagnosis not present

## 2022-06-21 DIAGNOSIS — J449 Chronic obstructive pulmonary disease, unspecified: Secondary | ICD-10-CM | POA: Diagnosis present

## 2022-06-21 DIAGNOSIS — R0600 Dyspnea, unspecified: Secondary | ICD-10-CM | POA: Diagnosis not present

## 2022-06-21 LAB — FOLATE: Folate: 17.2 ng/mL (ref >5.9)

## 2022-06-21 LAB — BODY FLUID CELL COUNT WITH DIFFERENTIAL
Eos, Fluid: 0 %
Lymphs, Fluid: 85 %
Monocyte-Macrophage-Serous Fluid: 11 % — ABNORMAL LOW (ref 50–90)
Neutrophil Count, Fluid: 4 % (ref 0–25)
Total Nucleated Cell Count, Fluid: 350 cu mm (ref 0–1000)

## 2022-06-21 LAB — GRAM STAIN

## 2022-06-21 LAB — LACTATE DEHYDROGENASE: LDH: 96 U/L — ABNORMAL LOW (ref 98–192)

## 2022-06-21 LAB — TROPONIN I (HIGH SENSITIVITY)
Troponin I (High Sensitivity): 2 ng/L (ref <18)
Troponin I (High Sensitivity): <2 ng/L (ref <18)

## 2022-06-21 LAB — VITAMIN B12: Vitamin B-12: 295 pg/mL (ref 180–914)

## 2022-06-21 LAB — CK: Total CK: 14 U/L — ABNORMAL LOW (ref 49–397)

## 2022-06-21 LAB — HEPATIC FUNCTION PANEL
ALT: 25 U/L (ref 0–44)
AST: 16 U/L (ref 15–41)
Albumin: 3.1 g/dL — ABNORMAL LOW (ref 3.5–5.0)
Alkaline Phosphatase: 60 U/L (ref 38–126)
Bilirubin, Direct: 0.2 mg/dL (ref 0.0–0.2)
Indirect Bilirubin: 0.8 mg/dL (ref 0.3–0.9)
Total Bilirubin: 1 mg/dL (ref 0.3–1.2)
Total Protein: 6.4 g/dL — ABNORMAL LOW (ref 6.5–8.1)

## 2022-06-21 LAB — LACTIC ACID, PLASMA
Lactic Acid, Venous: 1 mmol/L (ref 0.5–1.9)
Lactic Acid, Venous: 1.1 mmol/L (ref 0.5–1.9)

## 2022-06-21 LAB — CBC WITH DIFFERENTIAL/PLATELET
Abs Immature Granulocytes: 0.08 10*3/uL — ABNORMAL HIGH (ref 0.00–0.07)
Basophils Absolute: 0.1 10*3/uL (ref 0.0–0.1)
Basophils Relative: 1 %
Eosinophils Absolute: 0.1 10*3/uL (ref 0.0–0.5)
Eosinophils Relative: 1 %
HCT: 41.3 % (ref 39.0–52.0)
Hemoglobin: 13.8 g/dL (ref 13.0–17.0)
Immature Granulocytes: 1 %
Lymphocytes Relative: 5 %
Lymphs Abs: 0.5 10*3/uL — ABNORMAL LOW (ref 0.7–4.0)
MCH: 35.7 pg — ABNORMAL HIGH (ref 26.0–34.0)
MCHC: 33.4 g/dL (ref 30.0–36.0)
MCV: 106.7 fL — ABNORMAL HIGH (ref 80.0–100.0)
Monocytes Absolute: 0.9 10*3/uL (ref 0.1–1.0)
Monocytes Relative: 8 %
Neutro Abs: 8.7 10*3/uL — ABNORMAL HIGH (ref 1.7–7.7)
Neutrophils Relative %: 84 %
Platelets: 249 10*3/uL (ref 150–400)
RBC: 3.87 MIL/uL — ABNORMAL LOW (ref 4.22–5.81)
RDW: 13.5 % (ref 11.5–15.5)
WBC: 10.3 10*3/uL (ref 4.0–10.5)
nRBC: 0 % (ref 0.0–0.2)

## 2022-06-21 LAB — BASIC METABOLIC PANEL
Anion gap: 6 (ref 5–15)
BUN: 23 mg/dL (ref 8–23)
CO2: 29 mmol/L (ref 22–32)
Calcium: 9.1 mg/dL (ref 8.9–10.3)
Chloride: 105 mmol/L (ref 98–111)
Creatinine, Ser: 1.09 mg/dL (ref 0.61–1.24)
GFR, Estimated: >60 mL/min (ref >60)
Glucose, Bld: 101 mg/dL — ABNORMAL HIGH (ref 70–99)
Potassium: 4.1 mmol/L (ref 3.5–5.1)
Sodium: 140 mmol/L (ref 135–145)

## 2022-06-21 LAB — PROTEIN, PLEURAL OR PERITONEAL FLUID: Total protein, fluid: 3.3 g/dL

## 2022-06-21 LAB — BRAIN NATRIURETIC PEPTIDE: B Natriuretic Peptide: 102 pg/mL — ABNORMAL HIGH (ref 0.0–100.0)

## 2022-06-21 LAB — RESP PANEL BY RT-PCR (FLU A&B, COVID) ARPGX2
Influenza A by PCR: NEGATIVE
Influenza B by PCR: NEGATIVE
SARS Coronavirus 2 by RT PCR: NEGATIVE

## 2022-06-21 LAB — LACTATE DEHYDROGENASE, PLEURAL OR PERITONEAL FLUID: LD, Fluid: 80 U/L — ABNORMAL HIGH (ref 3–23)

## 2022-06-21 LAB — TSH: TSH: 2.173 u[IU]/mL (ref 0.350–4.500)

## 2022-06-21 MED ORDER — ACETAMINOPHEN 500 MG PO TABS: 1000.0000 mg | ORAL_TABLET | Freq: Four times a day (QID) | ORAL | Status: DC | PRN

## 2022-06-21 MED ORDER — HYDROMORPHONE HCL 2 MG PO TABS: 4.0000 mg | ORAL_TABLET | ORAL | Status: DC | PRN

## 2022-06-21 MED ORDER — PANTOPRAZOLE SODIUM 40 MG PO TBEC
40.0000 mg | DELAYED_RELEASE_TABLET | Freq: Every day | ORAL | Status: DC
  Administered 2022-06-22: 40 mg via ORAL
  Filled 2022-06-21: qty 1

## 2022-06-21 MED ORDER — ACETAMINOPHEN 650 MG RE SUPP: 650.0000 mg | Freq: Four times a day (QID) | RECTAL | Status: DC | PRN

## 2022-06-21 MED ORDER — METOPROLOL TARTRATE 25 MG PO TABS
12.5000 mg | ORAL_TABLET | Freq: Two times a day (BID) | ORAL | Status: DC
  Administered 2022-06-21 – 2022-06-22 (×2): 12.5 mg via ORAL
  Filled 2022-06-21 (×2): qty 1

## 2022-06-21 MED ORDER — LEVOTHYROXINE SODIUM 50 MCG PO TABS
50.0000 ug | ORAL_TABLET | Freq: Every day | ORAL | Status: DC
  Administered 2022-06-22: 50 ug via ORAL
  Filled 2022-06-21: qty 1

## 2022-06-21 MED ORDER — METHENAMINE HIPPURATE 1 G PO TABS
1.0000 g | ORAL_TABLET | Freq: Two times a day (BID) | ORAL | Status: DC
  Administered 2022-06-22: 1 g via ORAL
  Filled 2022-06-21 (×3): qty 1

## 2022-06-21 MED ORDER — FUROSEMIDE 10 MG/ML IJ SOLN
40.0000 mg | Freq: Once | INTRAMUSCULAR | Status: AC
  Administered 2022-06-21: 40 mg via INTRAVENOUS
  Filled 2022-06-21: qty 4

## 2022-06-21 MED ORDER — POLYETHYLENE GLYCOL 3350 17 G PO PACK: 17.0000 g | PACK | Freq: Every day | ORAL | Status: DC | PRN

## 2022-06-21 MED ORDER — PROCHLORPERAZINE EDISYLATE 10 MG/2ML IJ SOLN: 5.0000 mg | Freq: Four times a day (QID) | INTRAMUSCULAR | Status: DC | PRN

## 2022-06-21 MED ORDER — IOHEXOL 350 MG/ML SOLN
80.0000 mL | Freq: Once | INTRAVENOUS | Status: AC | PRN
  Administered 2022-06-21: 80 mL via INTRAVENOUS

## 2022-06-21 MED ORDER — INFLUENZA VAC A&B SA ADJ QUAD 0.5 ML IM PRSY
0.5000 mL | PREFILLED_SYRINGE | INTRAMUSCULAR | Status: AC
  Administered 2022-06-22: 0.5 mL via INTRAMUSCULAR
  Filled 2022-06-21: qty 0.5

## 2022-06-21 MED ORDER — ACETAMINOPHEN 325 MG PO TABS: 650.0000 mg | ORAL_TABLET | Freq: Four times a day (QID) | ORAL | Status: DC | PRN

## 2022-06-21 MED ORDER — LIDOCAINE HCL (PF) 2 % IJ SOLN
INTRAMUSCULAR | Status: AC
  Administered 2022-06-21: 10 mL
  Filled 2022-06-21: qty 10

## 2022-06-21 MED ORDER — APIXABAN 5 MG PO TABS
5.0000 mg | ORAL_TABLET | Freq: Two times a day (BID) | ORAL | Status: DC
  Administered 2022-06-21 – 2022-06-22 (×2): 5 mg via ORAL
  Filled 2022-06-21 (×2): qty 1

## 2022-06-21 MED ORDER — BOOST PO LIQD
237.0000 mL | Freq: Three times a day (TID) | ORAL | Status: DC
  Administered 2022-06-21: 237 mL via ORAL
  Filled 2022-06-21 (×6): qty 237

## 2022-06-21 NOTE — Progress Notes (Signed)
Patient tolerated right sided thoracentesis procedure well today and 1.3 Liters of clear yellow pleural fluid removed and sent to lab for processing. PT verbalized understanding of post procedure instructions. Post procedure chest xray taken and read by radiologist then patient transported back to ED bed assignment via stretcher with no acute distress noted.

## 2022-06-21 NOTE — H&P (Signed)
History and Physical    Patient: [name] DOB: Jan 24, 1941 DOA: 06/21/2022 DOS: the patient was seen and examined on 06/21/2022 PCP: [name], MD  Patient coming from: Home  Chief Complaint:  Chief Complaint  Patient presents with   Shortness of Breath   HPI: [name] is a 81 year old male with a history of paroxysmal atrial fibrillation, stage IV bladder cancer with metastasis to the lung and bone, hypothyroidism, COPD presenting with 1 to 52-monthhistory of progressive shortness of breath and generalized weakness.  He states that he has had worsening dyspnea on exertion.  He denies any worsening lower extremity edema or increased abdominal girth or orthopnea type symptoms.  He has not had any fevers, chills, chest pain, nausea, vomiting, diarrhea, abdominal pain.  He has had poor oral intake.  There is no hematochezia or melena.  He has not been started on any new medications. Notably, the patient had a recent hospitalization from 04/11/2022 to 04/17/2022 at which time he was treated for Pseudomonas sepsis. In the ED, the patient was afebrile and hemodynamically stable with oxygen saturation 95-96% on room air.  WBC 10.3, hemoglobin 13.8, platelets 249,000.  Sodium 140, potassium 4.1, bicarbonate 29, serum creatinine 1.09.  BNP was 102.0.  Troponin was 2>> less than 2.  CTA chest was negative for pulmonary embolus but showed new bilateral moderate to large pleural effusions.  There is biapical pulmonary nodules.  Because of his generalized weakness and shortness of breath, the patient was admitted for further evaluation and treatment.   Review of Systems: As mentioned in the history of present illness. All other systems reviewed and are negative. Past Medical History:  Diagnosis Date   [place]    [place]    BPH (benign prostatic hypertrophy)    Dysuria    GERD (gastroesophageal reflux disease)    History of adenomatous polyp of colon     tubular adenoma 06/ 2018   History of bladder cancer 12/2014   urologist-  dr bPilar Jarvis-  dx High Grade TCC without stromal invasion s/p TURBT and intravesical BCG tx/  recurrent 07/2015 (pTa) TCC  repear intravesical BCG tx    History of hiatal hernia    History of urethral stricture    Nocturia    Past Surgical History:  Procedure Laterality Date   CARDIAC CATHETERIZATION  1997   normal coronary arteries   CARDIOVASCULAR STRESS TEST  12-14-2005   Low risk perfusion study/  very small scar in the inferior septum from mid ventricle to apex,  no ischemia/  mild inferior septal hypokinesis/  ef 58%   CATARACT EXTRACTION W/ INTRAOCULAR LENS  IMPLANT, BILATERAL  2011   COLONOSCOPY  last one 06/ 2018   CYSTOSCOPY WITH BIOPSY N/A 08/12/2015   Procedure: CYSTOSCOPY WITH BIOPSY AND FULGERATION;  Surgeon: BNickie Retort MD;  [facility]  Service: Urology;  Laterality: N/A;   CYSTOSCOPY WITH INJECTION N/A 06/29/2017   Procedure: CYSTOSCOPY WITH INJECTION OF INDOCYANINE GREEN DYE;  Surgeon: MAlexis Frock MD;  [facility];  Service: Urology;  Laterality: N/A;   CYSTOSCOPY WITH URETHRAL DILATATION N/A 03/21/2017   Procedure: CYSTOSCOPY;  Surgeon: BNickie Retort MD;  [facility]  Service: Urology;  Laterality: N/A;   ESOPHAGOGASTRODUODENOSCOPY  12-05-2014   IR IMAGING GUIDED PORT INSERTION  02/07/2018   IR NEPHROSTOMY PLACEMENT LEFT  12/25/2021   IR REMOVAL TUN ACCESS W/ PORT W/O FL MOD SED  04/14/2018   TRANSTHORACIC ECHOCARDIOGRAM  01-31-2008   nomral LVF,  ef 55-65%/  mild pulmonic stenosis without regurg.,  peak transpulomonic valve gradient 61mHg   TRANSURETHRAL RESECTION OF BLADDER TUMOR N/A 12/31/2014   Procedure: TRANSURETHRAL RESECTION OF BLADDER TUMOR (TURBT);  Surgeon: MLowella Bandy MD;  [facility]  Service: Urology;  Laterality: N/A;   TRANSURETHRAL RESECTION OF BLADDER TUMOR N/A 03/21/2017   Procedure:  POSSIBLE TRANSURETHRAL RESECTION OF BLADDER TUMOR (TURBT);  Surgeon: BNickie Retort MD;  [facility]  Service: Urology;  Laterality: N/A;   Social History:  reports that he quit smoking about 33 years ago. His smoking use included cigarettes. He has a 34.00 pack-year smoking history. He has never used smokeless tobacco. He reports current alcohol use of about 6.0 standard drinks of alcohol per week. He reports that he does not use drugs.  Allergies  Allergen Reactions   Ciprofloxacin Hives   Zofran [Ondansetron] Rash    Family History  Problem Relation Age of Onset   Alcoholism Father    Colon cancer Neg Hx    Colon polyps Neg Hx    Diabetes Neg Hx    Kidney disease Neg Hx    Esophageal cancer Neg Hx    Heart disease Neg Hx    Gallbladder disease Neg Hx    Allergic rhinitis Neg Hx    Angioedema Neg Hx    Asthma Neg Hx    Atopy Neg Hx    Eczema Neg Hx    Immunodeficiency Neg Hx    Urticaria Neg Hx     Prior to Admission medications   Medication Sig Start Date End Date Taking? Authorizing Provider  acetaminophen (TYLENOL) 500 MG tablet Take 1,000 mg by mouth every 6 (six) hours as needed for mild pain, fever, moderate pain or headache.   Yes [provider]  apixaban (ELIQUIS) 5 MG TABS tablet Take 1 tablet (5 mg total) by mouth 2 (two) times daily. 04/17/22  Yes Johnson, Clanford L, MD  clotrimazole-betamethasone (LOTRISONE) cream Apply 1 application  topically in the morning and at bedtime. 06/27/20  Yes [provider]  doxycycline (VIBRA-TABS) 100 MG tablet Take 100 mg by mouth 2 (two) times daily. 04/23/22  Yes [provider]  methenamine (HIPREX) 1 g tablet Take 1 tablet (1 g total) by mouth 2 (two) times daily with a meal. 06/16/22  Yes McKenzie, PCandee Furbish MD  metoprolol tartrate (LOPRESSOR) 25 MG tablet Take 1 tablet (25 mg total) by mouth 2 (two) times daily. 04/17/22  Yes Johnson, Clanford L, MD  pantoprazole  (PROTONIX) 40 MG tablet Take 1 tablet (40 mg total) by mouth daily. 04/17/22  Yes Johnson, Clanford L, MD  polyethylene glycol (MIRALAX / GLYCOLAX) 17 g packet Take 17 g by mouth daily as needed for mild constipation. 07/18/20  Yes Emokpae, Courage, MD  HYDROmorphone (DILAUDID) 4 MG tablet Take 1 tablet (4 mg total) by mouth every 4 (four) hours as needed for severe pain. Patient not taking: Reported on 06/21/2022 03/23/22   SWyatt Portela MD  lactose free nutrition (BOOST) LIQD Take 237 mLs by mouth 3 (three) times daily between meals.     [provider]  levothyroxine (SYNTHROID) 50 MCG tablet Take 50 mcg by mouth daily. Patient not taking: Reported on 06/21/2022 10/01/21   [provider]    Physical Exam: Vitals:   06/21/22 1330 06/21/22 1400 06/21/22 1410 06/21/22 1430  BP: 119/78 131/76  Pulse: 73 73 75   Resp: 20 17 (!) 23   Temp:    97.8 F (36.6 C)  TempSrc:    Oral  SpO2: 95% 96% 96%   Weight:      Height:       GENERAL:  A&O x 3, NAD, well developed, cooperative, follows commands HEENT: [place]/AT, No thrush, No icterus, No oral ulcers Neck:  No neck mass, No meningismus, soft, supple CV: RRR, no S3, no S4, no rub, no JVD Lungs: Bibasilar crackles.  Diminished breath sounds at the bases. Abd: soft/NT +BS, nondistended Ext: No edema, no lymphangitis, no cyanosis, no rashes Neuro:  CN II-XII intact, strength 4/5 in RUE, RLE, strength 4/5 LUE, LLE; sensation intact bilateral; no dysmetria; babinski equivocal  Data Reviewed: Data reviewed above in history  Assessment and Plan: Generalized weakness -Multifactorial including deconditioning, pleural effusions, and his metastatic bladder cancer -TSH -H29 -folic acid -CPK -PT eval  Pleural effusions -Likely malignant pleural effusions -Thoracocentesis>> sent for cytology and cell count -Echo  Paroxysmal atrial fibrillation -Currently in sinus rhythm -Patient follows Dr. Crissie Sickles -Continue  apixaban -Continue metoprolol  Metastatic bladder cancer -He has established metastasis to the lung and bones -Patient's stage IV high-grade urothelial carcinoma was originally diagnosed 2018 with pulmonary and bone involvement. T3bN0 disease. This tumor found to have PDL 1 positive  with CPS score over 90%.  -laparoscopic Cystoprostatectomy and bilateral lymphadenectomy done by Dr. Tresa Moore on 06/29/2017  -03/15/22 PET-Overall decrease in hypermetabolism involving mediastinal and hilar lymph nodes, compatible with response to therapy. 04/11/22---CT abdomen and pelvis shows mild left hydro ureteral nephrosis with extensive urothelial hyperenhancement involving the left ureter until this stent in the left renal pelvis, moderate bilateral pleural effusions, possible lytic lesion in the right inferior pubic rami-similar to prior studies suggestive of osseous metastases   Hypothyroidism -Continue levothyroxine  COPD -No hypoxia at this time    Advance Care Planning: FULL  Consults: none  Family Communication: spouse updated 11/6  Severity of Illness: The appropriate patient status for this patient is OBSERVATION. Observation status is judged to be reasonable and necessary in order to provide the required intensity of service to ensure the patient's safety. The patient's presenting symptoms, physical exam findings, and initial radiographic and laboratory data in the context of their medical condition is felt to place them at decreased risk for further clinical deterioration. Furthermore, it is anticipated that the patient will be medically stable for discharge from the hospital within 2 midnights of admission.   Author: Orson Eva, MD 06/21/2022 2:44 PM  For on call review [email or web address].

## 2022-06-21 NOTE — Procedures (Signed)
PreOperative Dx: RIGHT pleural effusion Postoperative Dx: RIGHT pleural effusion Procedure:   US guided RIGHT thoracentesis Radiologist:  Thornton Papas Anesthesia:  10 ml of 1% lidocaine Specimen:  1.3 L of clear yellow colored fluid EBL:   < 1 ml Complications:  None

## 2022-06-21 NOTE — ED Provider Notes (Signed)
[facility] EMERGENCY DEPARTMENT Provider Note   CSN: [id number] Arrival date & time: 06/21/22  3419     History  Chief Complaint  Patient presents with   Shortness of Breath    [name] is a 81 y.o. male with past medical history significant for COPD, stage IV malignant neoplasm bladder with mets to bones, lungs, with removal of urinary bladder around 4 years ago who presents with concern for worsening shortness of breath.  Reports coughing up white mucus, more short of breath with exertion, he had a breathing treatment this morning and feels a little bit better, reports that he is been having persistent cough for 4 to 5 months, worse over the last couple of weeks.  He is currently taking some kind of an antibiotic for his urinary tract infection recently diagnosed but he is not sure what antibiotic this would be.  He was sent over by Dr. Armandina Gemma for some kind of testing and imaging but cannot recall what Dr. Armandina Gemma wanted.  Is on Eliquis for A-fib, however he did miss around a week of doses last time he was in the hospital around a month ago.  On chart review patient is taking Hiprex for recurrent urinary tract infection.   Shortness of Breath      Home Medications Prior to Admission medications   Medication Sig Start Date End Date Taking? Authorizing Provider  acetaminophen (TYLENOL) 500 MG tablet Take 1,000 mg by mouth every 6 (six) hours as needed.    [provider]  apixaban (ELIQUIS) 5 MG TABS tablet Take 1 tablet (5 mg total) by mouth 2 (two) times daily. Patient not taking: Reported on 06/01/2022 04/17/22   Murlean Iba, MD  clotrimazole-betamethasone (LOTRISONE) cream Apply 1 application  topically in the morning and at bedtime. 06/27/20   [provider]  doxycycline (VIBRA-TABS) 100 MG tablet Take 100 mg by mouth 2 (two) times daily. 04/23/22   [provider]  HYDROmorphone (DILAUDID) 4 MG tablet Take 1 tablet (4 mg total) by mouth every  4 (four) hours as needed for severe pain. 03/23/22   [name], MD  lactose free nutrition (BOOST) LIQD Take 237 mLs by mouth 3 (three) times daily between meals.     [provider]  levothyroxine (SYNTHROID) 50 MCG tablet Take 50 mcg by mouth daily. 10/01/21   [provider]  methenamine (HIPREX) 1 g tablet Take 1 tablet (1 g total) by mouth 2 (two) times daily with a meal. 06/16/22   McKenzie, Candee Furbish, MD  metoprolol tartrate (LOPRESSOR) 25 MG tablet Take 1 tablet (25 mg total) by mouth 2 (two) times daily. 04/17/22   Johnson, Clanford L, MD  pantoprazole (PROTONIX) 40 MG tablet Take 1 tablet (40 mg total) by mouth daily. 04/17/22   Johnson, Clanford L, MD  polyethylene glycol (MIRALAX / GLYCOLAX) 17 g packet Take 17 g by mouth daily as needed for mild constipation. 07/18/20   Roxan Hockey, MD      Allergies    Ciprofloxacin and Zofran [ondansetron]    Review of Systems   Review of Systems  Respiratory:  Positive for shortness of breath.   All other systems reviewed and are negative.   Physical Exam Updated Vital Signs Ht '5\' 11"'$  (1.803 m)   Wt 87.9 kg   BMI 27.03 kg/m  Physical Exam Vitals and nursing note reviewed.  Constitutional:      General: He is not in acute distress.  Appearance: Normal appearance. He is ill-appearing.     Comments: Chronically ill appearing  HENT:     Head: Normocephalic and atraumatic.  Eyes:     General:        Right eye: No discharge.        Left eye: No discharge.  Cardiovascular:     Rate and Rhythm: Normal rate and regular rhythm.     Heart sounds: No murmur heard.    No friction rub. No gallop.  Pulmonary:     Effort: Pulmonary effort is normal.     Breath sounds: Normal breath sounds.     Comments: Overall clear lung sounds throughout, no wheezing, rhonchi, stridor, rales Abdominal:     General: Bowel sounds are normal.     Palpations: Abdomen is soft.     Comments: Appropriately healed urostomy in place in  right middle abdomen, some tenderness to palpation of abdomen on left without rebound, rigidity, guarding, no significant distention, redness, ecchymosis noted.  Skin:    General: Skin is warm and dry.     Capillary Refill: Capillary refill takes less than 2 seconds.  Neurological:     Mental Status: He is alert and oriented to person, place, and time.  Psychiatric:        Mood and Affect: Mood normal.        Behavior: Behavior normal.     ED Results / Procedures / Treatments   Labs (all labs ordered are listed, but only abnormal results are displayed) Labs Reviewed - No data to display  EKG None  Radiology No results found.  Procedures Procedures    Medications Ordered in ED Medications - No data to display  ED Course/ Medical Decision Making/ A&P                           Medical Decision Making Amount and/or Complexity of Data Reviewed Labs: ordered. Radiology: ordered.  Risk Prescription drug management. Decision regarding hospitalization.   This patient is a 81 y.o. male who presents to the ED for concern of shob, this involves an extensive number of treatment options, and is a complaint that carries with it a high risk of complications and morbidity. The emergent differential diagnosis prior to evaluation includes, but is not limited to,  asthma exacerbation, COPD exacerbation, acute upper respiratory infection, acute bronchitis, chronic bronchitis, interstitial lung disease, ARDS, PE, pneumonia, atypical ACS, carbon monoxide poisoning, spontaneous pneumothorax versus other .   This is not an exhaustive differential.   Past Medical History / Co-morbidities / Social History: COPD, malignant neoplasm of bladder with metastases to lungs, spine, acid reflux  Additional history: Chart reviewed. Pertinent results include: Extensively reviewed recent lab work, imaging from previous admissions, oncology visits, urology visits.  Patient is not currently undergoing  chemo, radiation but is on chronic antibiotics due to recurrent UTIs.  Physical Exam: Physical exam performed. The pertinent findings include: On exam patient is found to be orthopneic, feels more short of breath when laying flat, and increase shortness of breath with ambulation, his oxygen saturation has been fairly stable however, occasionally with increased respiratory rate, as well as wet sounding cough.  He has urostomy in place anterior abdomen, which appears well-healed, urine inside of urostomy bag appears normal.  Lab Tests: I ordered, and personally interpreted labs.  The pertinent results include: CBC overall unremarkable, no significant leukocytosis, anemia, RVP is unremarkable, troponin negative x2, lactic acid is unremarkable at this  time.   Imaging Studies: I ordered imaging studies including CT angio PE study, plain film chest x-ray. I independently visualized and interpreted imaging which showed moderate to large layering pleural effusions, given his history of cancer, lack of fever, lack of leukocytosis fever most likely malignant in nature. I agree with the radiologist interpretation.   Cardiac Monitoring:  The patient was maintained on a cardiac monitor.  My attending physician Dr. Matilde Sprang viewed and interpreted the cardiac monitored which showed an underlying rhythm of: NSR, APCs occasionally. I agree with this interpretation.   Medications: I ordered medication including lasix for pleural effusions.  Patient will need clinical reevaluation after diuresis, but likely would benefit from thoracentesis based on my exam findings.  Consultations Obtained: I requested consultation with the hospitalist, spoke with Dr. Carles Collet,  and discussed lab and imaging findings as well as pertinent plan - they recommend: Admission   Disposition: After consideration of the diagnostic results and the patients response to treatment, I feel that patient would benefit from admission for cough, shortness  of breath, and evaluation of his large pleural effusions.   emergency department workup does not suggest an emergent condition requiring admission or immediate intervention beyond what has been performed at this time. The plan is: as above. The patient is safe for discharge and has been instructed to return immediately for worsening symptoms, change in symptoms or any other concerns.  I discussed this case with my attending physician Dr. Matilde Sprang who cosigned this note including patient's presenting symptoms, physical exam, and planned diagnostics and interventions. Attending physician stated agreement with plan or made changes to plan which were implemented.    Final Clinical Impression(s) / ED Diagnoses Final diagnoses:  None    Rx / DC Orders ED Discharge Orders     None         Dorien Chihuahua 06/21/22 1833    Teressa Lower, MD 06/22/22 [phone]

## 2022-06-21 NOTE — ED Triage Notes (Signed)
Pt states he went to his PCP this am for sob and his PCP sent him to ED; pt c/o coughing up white mucous  Pt c/o more sob with exertion  Pt had breathing treatment this am and he states he feels better

## 2022-06-21 NOTE — Hospital Course (Addendum)
81 year old male with a history of paroxysmal atrial fibrillation, stage IV bladder cancer with metastasis to the lung and bone, hypothyroidism, COPD presenting with 1 to 75-monthhistory of progressive shortness of breath and generalized weakness.  He states that he has had worsening dyspnea on exertion.  He denies any worsening lower extremity edema or increased abdominal girth or orthopnea type symptoms.  He has not had any fevers, chills, chest pain, nausea, vomiting, diarrhea, abdominal pain.  He has had poor oral intake.  There is no hematochezia or melena.  He has not been started on any new medications. Notably, the patient had a recent hospitalization from 04/11/2022 to 04/17/2022 at which time he was treated for Pseudomonas sepsis. In the ED, the patient was afebrile and hemodynamically stable with oxygen saturation 95-96% on room air.  WBC 10.3, hemoglobin 13.8, platelets 249,000.  Sodium 140, potassium 4.1, bicarbonate 29, serum creatinine 1.09.  BNP was 102.0.  Troponin was 2>> less than 2.  CTA chest was negative for pulmonary embolus but showed new bilateral moderate to large pleural effusions.  There is biapical pulmonary nodules.  Because of his generalized weakness and shortness of breath, the patient was admitted for further evaluation and treatment.

## 2022-06-22 ENCOUNTER — Telehealth: Payer: Self-pay | Admitting: *Deleted

## 2022-06-22 ENCOUNTER — Observation Stay (HOSPITAL_BASED_OUTPATIENT_CLINIC_OR_DEPARTMENT_OTHER): Payer: Medicare Other

## 2022-06-22 DIAGNOSIS — R0609 Other forms of dyspnea: Secondary | ICD-10-CM

## 2022-06-22 DIAGNOSIS — J90 Pleural effusion, not elsewhere classified: Secondary | ICD-10-CM | POA: Diagnosis not present

## 2022-06-22 DIAGNOSIS — C679 Malignant neoplasm of bladder, unspecified: Secondary | ICD-10-CM | POA: Diagnosis not present

## 2022-06-22 DIAGNOSIS — R531 Weakness: Secondary | ICD-10-CM | POA: Diagnosis not present

## 2022-06-22 LAB — BASIC METABOLIC PANEL
Anion gap: 11 (ref 5–15)
BUN: 22 mg/dL (ref 8–23)
CO2: 27 mmol/L (ref 22–32)
Calcium: 9.1 mg/dL (ref 8.9–10.3)
Chloride: 104 mmol/L (ref 98–111)
Creatinine, Ser: 1.04 mg/dL (ref 0.61–1.24)
GFR, Estimated: >60 mL/min (ref >60)
Glucose, Bld: 97 mg/dL (ref 70–99)
Potassium: 4 mmol/L (ref 3.5–5.1)
Sodium: 142 mmol/L (ref 135–145)

## 2022-06-22 LAB — ECHOCARDIOGRAM COMPLETE
AR max vel: 1.72 cm2
AV Area VTI: 1.64 cm2
AV Area mean vel: 1.77 cm2
AV Mean grad: 6 mmHg
AV Peak grad: 12.2 mmHg
Ao pk vel: 1.75 m/s
Area-P 1/2: 4.46 cm2
Height: 71 in
MV VTI: 2.33 cm2
S' Lateral: 2.1 cm
Weight: 3027.2 oz

## 2022-06-22 LAB — T4, FREE: Free T4: 1.12 ng/dL (ref 0.61–1.12)

## 2022-06-22 LAB — CBC
HCT: 40.1 % (ref 39.0–52.0)
Hemoglobin: 13.6 g/dL (ref 13.0–17.0)
MCH: 35.6 pg — ABNORMAL HIGH (ref 26.0–34.0)
MCHC: 33.9 g/dL (ref 30.0–36.0)
MCV: 105 fL — ABNORMAL HIGH (ref 80.0–100.0)
Platelets: 267 10*3/uL (ref 150–400)
RBC: 3.82 MIL/uL — ABNORMAL LOW (ref 4.22–5.81)
RDW: 13.4 % (ref 11.5–15.5)
WBC: 9.8 10*3/uL (ref 4.0–10.5)
nRBC: 0 % (ref 0.0–0.2)

## 2022-06-22 MED ORDER — PNEUMOCOCCAL 20-VAL CONJ VACC 0.5 ML IM SUSY
0.5000 mL | PREFILLED_SYRINGE | INTRAMUSCULAR | Status: AC
  Administered 2022-06-22: 0.5 mL via INTRAMUSCULAR
  Filled 2022-06-22: qty 0.5

## 2022-06-22 MED ORDER — VITAMIN B-12 100 MCG PO TABS
500.0000 ug | ORAL_TABLET | Freq: Every day | ORAL | Status: DC
  Administered 2022-06-22: 500 ug via ORAL
  Filled 2022-06-22: qty 5

## 2022-06-22 MED ORDER — CYANOCOBALAMIN 500 MCG PO TABS: 500.0000 ug | ORAL_TABLET | Freq: Every day | ORAL | Status: DC

## 2022-06-22 NOTE — Progress Notes (Signed)
Patient's telemetry strip have shown him going from normal sinus to a flutter to afib then back into normal sinus. Patient's wife stated that he has done this before. Patient has no complaints and does not notice change in rhythm

## 2022-06-22 NOTE — Evaluation (Signed)
Physical Therapy Evaluation and Discharge Patient Details Name: [name] MRN: [id number] DOB: 1940-12-01 Today's Date: 06/22/2022  History of Present Illness  [name] is a 81 year old male with a history of paroxysmal atrial fibrillation, stage IV bladder cancer with metastasis to the lung and bone, hypothyroidism, COPD presenting with 1 to 77-monthhistory of progressive shortness of breath and generalized weakness.  He states that he has had worsening dyspnea on exertion.  He denies any worsening lower extremity edema or increased abdominal girth or orthopnea type symptoms.  He has not had any fevers, chills, chest pain, nausea, vomiting, diarrhea, abdominal pain.  He has had poor oral intake.  There is no hematochezia or melena.  He has not been started on any new medications.  Notably, the patient had a recent hospitalization from 04/11/2022 to 04/17/2022 at which time he was treated for Pseudomonas sepsis.  Clinical Impression  Patient evaluated by Physical Therapy with no further acute PT needs identified. All education has been completed and the patient has no further questions. Completed higher level dynamic gait and balance tasks without overt LOB. Able to performed variable gait speeds, quick turns, backwards stepping, and head turns/nods. Minor drift and instability noted, declines SPC use here but agreeable to use at home. Was able to self correct. Generalized weakness, but reports baseline. Agreeable to HHPT. Supportive spouse available 24/7 as needed. See below for any follow-up Physical Therapy or equipment needs. PT is signing off. Thank you for this referral.        Recommendations for follow up therapy are one component of a multi-disciplinary discharge planning process, led by the attending physician.  Recommendations may be updated based on patient status, additional functional criteria and insurance authorization.  Follow Up Recommendations Outpatient PT      Assistance  Recommended at Discharge PRN  Patient can return home with the following  Assist for transportation;Help with stairs or ramp for entrance    Equipment Recommendations None recommended by PT  Recommendations for Other Services       Functional Status Assessment Patient has not had a recent decline in their functional status     Precautions / Restrictions Precautions Precautions: Fall Restrictions Weight Bearing Restrictions: No      Mobility  Bed Mobility Overal bed mobility: Modified Independent             General bed mobility comments: extra time    Transfers Overall transfer level: Modified independent Equipment used: None               General transfer comment: good strength to boost and stand    Ambulation/Gait Ambulation/Gait assistance: Supervision Gait Distance (Feet): 200 Feet Assistive device: None Gait Pattern/deviations: Drifts right/left Gait velocity: wnl Gait velocity interpretation: >2.62 ft/sec, indicative of community ambulatory   General Gait Details: Tolerated higher level challenges with minor instability noted. No overt LOB, and able to self correct. Declines trying assistive device today but recommended he use his reported SPC at home.  Stairs Stairs:  (decline)          Wheelchair Mobility    Modified Rankin (Stroke Patients Only)       Balance Overall balance assessment: Mild deficits observed, not formally tested                                           Pertinent Vitals/Pain Pain  Assessment Pain Assessment: No/denies pain    Home Living Family/patient expects to be discharged to:: Private residence Living Arrangements: Spouse/significant other Available Help at Discharge: Family;Available 24 hours/day Type of Home: House Home Access: Stairs to enter Entrance Stairs-Rails: Left Entrance Stairs-Number of Steps: 8+8 (has a chair lift)   Home Layout: One level Home Equipment: Grab bars -  tub/shower;Rolling Walker (2 wheels);Cane - single point;Shower seat      Prior Function Prior Level of Function : Independent/Modified Independent             Mobility Comments: community ambulator without AD, drives, shops ADLs Comments: independent     Hand Dominance   Dominant Hand: Right    Extremity/Trunk Assessment   Upper Extremity Assessment Upper Extremity Assessment: Defer to OT evaluation    Lower Extremity Assessment Lower Extremity Assessment: Generalized weakness       Communication   Communication: No difficulties  Cognition Arousal/Alertness: Awake/alert Behavior During Therapy: WFL for tasks assessed/performed Overall Cognitive Status: Within Functional Limits for tasks assessed                                          General Comments General comments (skin integrity, edema, etc.): HR 98 at rest, 103 ambulating    Exercises     Assessment/Plan    PT Assessment Patient does not need any further PT services  PT Problem List         PT Treatment Interventions      PT Goals (Current goals can be found in the Care Plan section)  Acute Rehab PT Goals Patient Stated Goal: go home PT Goal Formulation: All assessment and education complete, DC therapy    Frequency       Co-evaluation               AM-PAC PT "6 Clicks" Mobility  Outcome Measure Help needed turning from your back to your side while in a flat bed without using bedrails?: None Help needed moving from lying on your back to sitting on the side of a flat bed without using bedrails?: None Help needed moving to and from a bed to a chair (including a wheelchair)?: None Help needed standing up from a chair using your arms (e.g., wheelchair or bedside chair)?: None Help needed to walk in hospital room?: A Little Help needed climbing 3-5 steps with a railing? : A Little 6 Click Score: 22    End of Session Equipment Utilized During Treatment: Gait  belt Activity Tolerance: Patient tolerated treatment well Patient left: in bed;with call bell/phone within reach;with family/visitor present   PT Visit Diagnosis: Unsteadiness on feet (R26.81);Other abnormalities of gait and mobility (R26.89);Muscle weakness (generalized) (M62.81)    Time: 5102-5852 PT Time Calculation (min) (ACUTE ONLY): 17 min   Charges:   PT Evaluation $PT Eval Low Complexity: 1 Low          [name], PT, DPT Physical Therapist Acute Rehabilitation Services [place]   Ellouise Newer 06/22/2022, 12:01 PM

## 2022-06-22 NOTE — Telephone Encounter (Signed)
PC to patient's daughter, Lattie Haw, informed her per Dr Alen Blew patient may come in next week or whenever he is out of the hospital for infusion & that scheduling will contact her for appointment.  She verbalizes understanding.

## 2022-06-22 NOTE — Discharge Summary (Signed)
Physician Discharge Summary   Patient: [name] MRN: [id number] DOB: 1941/03/23  Admit date:     06/21/2022  Discharge date: 06/22/22  Discharge Physician: Shanon Brow Kouper Spinella   PCP: [name], MD   Recommendations at discharge:   Please follow up with primary care provider within 1-2 weeks  Please repeat BMP and CBC in one week    Hospital Course: 81 year old male with a history of paroxysmal atrial fibrillation, stage IV bladder cancer with metastasis to the lung and bone, hypothyroidism, COPD presenting with 1 to 30-monthhistory of progressive shortness of breath and generalized weakness.  He states that he has had worsening dyspnea on exertion.  He denies any worsening lower extremity edema or increased abdominal girth or orthopnea type symptoms.  He has not had any fevers, chills, chest pain, nausea, vomiting, diarrhea, abdominal pain.  He has had poor oral intake.  There is no hematochezia or melena.  He has not been started on any new medications. Notably, the patient had a recent hospitalization from 04/11/2022 to 04/17/2022 at which time he was treated for Pseudomonas sepsis. In the ED, the patient was afebrile and hemodynamically stable with oxygen saturation 95-96% on room air.  WBC 10.3, hemoglobin 13.8, platelets 249,000.  Sodium 140, potassium 4.1, bicarbonate 29, serum creatinine 1.09.  BNP was 102.0.  Troponin was 2>> less than 2.  CTA chest was negative for pulmonary embolus but showed new bilateral moderate to large pleural effusions.  There is biapical pulmonary nodules.  Because of his generalized weakness and shortness of breath, the patient was admitted for further evaluation and treatment.  Assessment and Plan: Generalized weakness -Multifactorial including deconditioning, pleural effusions, and his metastatic bladder cancer -TYKD--9.833-BA25--053-folic aZJQB-34.1-CPFX--90-PT eval>>outpatient PT 06/22/22--pt feels less sob.  Still has some generalized weakness>>PT  eval   Exudative Pleural effusions -Likely malignant pleural effusions -11/6 Right Thoracocentesis>> 1.3L removed>>exudative -cytology pending at time of dc -stable on RA -offered LEFT thora on 11/7>>pt wanted to defer as he was feeling better -would repeat CXR in 2-3 days after d/c   Paroxysmal atrial fibrillation -Currently in sinus rhythm -Patient follows Dr. GCrissie Sickles-Continue apixaban -Continue metoprolol   Metastatic bladder cancer -He has established metastasis to the lung and bones -Patient's stage IV high-grade urothelial carcinoma was originally diagnosed 2018 with pulmonary and bone involvement. T3bN0 disease. This tumor found to have PDL 1 positive  with CPS score over 90%.  -laparoscopic Cystoprostatectomy and bilateral lymphadenectomy done by Dr. MTresa Mooreon 06/29/2017  -03/15/22 PET-Overall decrease in hypermetabolism involving mediastinal and hilar lymph nodes, compatible with response to therapy. 04/11/22---CT abdomen and pelvis shows mild left hydro ureteral nephrosis with extensive urothelial hyperenhancement involving the left ureter until this stent in the left renal pelvis, moderate bilateral pleural effusions, possible lytic lesion in the right inferior pubic rami-similar to prior studies suggestive of osseous metastases    Hypothyroidism -Continue levothyroxine   COPD -No hypoxia at this time         Consultants: none Procedures performed: RIGHT thora  Disposition: Home Diet recommendation:  Regular diet DISCHARGE MEDICATION: Allergies as of 06/22/2022       Reactions   Ciprofloxacin Hives   Zofran [ondansetron] Rash        Medication List     STOP taking these medications    HYDROmorphone 4 MG tablet Commonly known as: DILAUDID       TAKE these medications    acetaminophen 500 MG tablet Commonly known as: TYLENOL  Take 1,000 mg by mouth every 6 (six) hours as needed for mild pain, fever, moderate pain or headache.   apixaban 5 MG  Tabs tablet Commonly known as: ELIQUIS Take 1 tablet (5 mg total) by mouth 2 (two) times daily.   clotrimazole-betamethasone cream Commonly known as: LOTRISONE Apply 1 application  topically in the morning and at bedtime.   cyanocobalamin 500 MCG tablet Commonly known as: VITAMIN B12 Take 1 tablet (500 mcg total) by mouth daily.   doxycycline 100 MG tablet Commonly known as: VIBRA-TABS Take 100 mg by mouth 2 (two) times daily.   lactose free nutrition Liqd Take 237 mLs by mouth 3 (three) times daily between meals.   levothyroxine 50 MCG tablet Commonly known as: SYNTHROID Take 50 mcg by mouth daily.   methenamine 1 g tablet Commonly known as: Hiprex Take 1 tablet (1 g total) by mouth 2 (two) times daily with a meal.   metoprolol tartrate 25 MG tablet Commonly known as: LOPRESSOR Take 1 tablet (25 mg total) by mouth 2 (two) times daily.   pantoprazole 40 MG tablet Commonly known as: PROTONIX Take 1 tablet (40 mg total) by mouth daily.   polyethylene glycol 17 g packet Commonly known as: MIRALAX / GLYCOLAX Take 17 g by mouth daily as needed for mild constipation.        Discharge Exam: Filed Weights   06/21/22 0911 06/21/22 2300  Weight: 87.9 kg 85.8 kg   HEENT:  [place]/AT, No thrush, no icterus CV:  RRR, no rub, no S3, no S4 Lung:  bibasilar crackles. No wheeze Abd:  soft/+BS, NT Ext:  No edema, no lymphangitis, no synovitis, no rash   Condition at discharge: stable  The results of significant diagnostics from this hospitalization (including imaging, microbiology, ancillary and laboratory) are listed below for reference.   Imaging Studies: DG Chest 1 View  Result Date: 06/21/2022 CLINICAL DATA:  Post RIGHT thoracentesis EXAM: CHEST  1 VIEW COMPARISON:  Repeat exam 1508 hours compared to 0959 hours FINDINGS: Stable heart size and mediastinal contours. Mild LEFT basilar atelectasis. Decreased RIGHT pleural effusion post thoracentesis without pneumothorax. Ovoid  opacity at RIGHT upper lobe/RIGHT apex, 29 x 15 mm similar to earlier CT. Remaining lungs clear. No acute osseous findings. IMPRESSION: No pneumothorax following RIGHT thoracentesis. LEFT basilar atelectasis with persistent ovoid nodular density at RIGHT apex. Electronically Signed   By: Lavonia Dana M.D.   On: 06/21/2022 15:23   US THORACENTESIS ASP PLEURAL SPACE W/IMG GUIDE  Result Date: 06/21/2022 INDICATION: RIGHT pleural effusion, shortness of breath for 3 months EXAM: ULTRASOUND GUIDED DIAGNOSTIC AND THERAPEUTIC RIGHT THORACENTESIS MEDICATIONS: None. COMPLICATIONS: None immediate. PROCEDURE: An ultrasound guided thoracentesis was thoroughly discussed with the patient and questions answered. The benefits, risks, alternatives and complications were also discussed. The patient understands and wishes to proceed with the procedure. Written consent was obtained. Ultrasound was performed to localize and mark an adequate pocket of fluid in the RIGHT chest. The area was then prepped and draped in the normal sterile fashion. 1% Lidocaine was used for local anesthesia. Under ultrasound guidance a 6 Fr Safe-T-Centesis catheter was introduced. Thoracentesis was performed. The catheter was removed and a dressing applied. FINDINGS: A total of approximately 1.3 L of clear yellow RIGHT pleural fluid was removed. Samples were sent to the laboratory as requested by the clinical team. IMPRESSION: Successful ultrasound guided RIGHT thoracentesis yielding 1.3 L of pleural fluid. Electronically Signed   By: Lavonia Dana M.D.   On: 06/21/2022 15:22  CT Angio Chest PE W and/or Wo Contrast  Result Date: 06/21/2022 CLINICAL DATA:  Pulmonary is suspected probability EXAM: CT ANGIOGRAPHY CHEST WITH CONTRAST TECHNIQUE: Multidetector CT imaging of the chest was performed using the standard protocol during bolus administration of intravenous contrast. Multiplanar CT image reconstructions and MIPs were obtained to evaluate the vascular  anatomy. RADIATION DOSE REDUCTION: This exam was performed according to the departmental dose-optimization program which includes automated exposure control, adjustment of the mA and/or kV according to patient size and/or use of iterative reconstruction technique. CONTRAST:  11m OMNIPAQUE IOHEXOL 350 MG/ML SOLN COMPARISON:  CT chest 12/07/2021 FINDINGS: Cardiovascular: No filling defects within the pulmonary arteries to suggest acute pulmonary embolism. Mediastinum/Nodes: No axillary or supraclavicular adenopathy. No mediastinal or hilar adenopathy. No pericardial fluid. Esophagus normal. Lungs/Pleura: New bilateral moderate-to-large pleural effusions. Pleural effusion volume greater on the RIGHT. Again demonstrated irregular band of nodularity in the RIGHT upper lobe measuring 19 mm with compared to 20 mm (image 38/8). Irregular nodule in the LEFT upper lobe measuring 9 mm (image 39/8) compares to 12 mm. No new nodularity. Upper Abdomen: Limited view of the liver, kidneys, pancreas are unremarkable. Normal adrenal glands. Low-density cysts within liver. Musculoskeletal: No aggressive osseous lesion. Review of the MIP images confirms the above findings. IMPRESSION: 1. No evidence acute pulmonary embolism. 2. New bilateral moderate to large layering pleural effusions. 3. Stable biapical irregular pulmonary nodules compared to CT 12/07/2021. Electronically Signed   By: SSuzy BouchardM.D.   On: 06/21/2022 13:17   DG Chest 2 View  Result Date: 06/21/2022 CLINICAL DATA:  Shortness of breath, cough EXAM: CHEST - 2 VIEW COMPARISON:  Previous studies including the examination of 04/12/2022 FINDINGS: Cardiac size is within normal limits. There are no signs of pulmonary edema or new focal pulmonary consolidation. Small bilateral pleural effusions are seen. Linear densities in right lower lung fields suggest minimal subsegmental atelectasis. IMPRESSION: Small bilateral pleural effusions. Linear densities in right lower  lung fields suggest subsegmental atelectasis. Electronically Signed   By: PElmer PickerM.D.   On: 06/21/2022 10:29   DG Chest 2 View  Result Date: 06/21/2022 CLINICAL DATA:  Shortness of breath EXAM: CHEST - 2 VIEW COMPARISON:  Previous studies including the examination of 06/18/2022 FINDINGS: Cardiac size is within normal limits. There is slight increase in small bilateral pleural effusions. Linear densities in the lower lung fields may suggest subsegmental atelectasis. There are no signs of alveolar pulmonary edema or new focal infiltrates. IMPRESSION: There is increased in size of small bilateral pleural effusions. Linear densities in the lower lung fields may suggest crowding of normal bronchovascular structures or subsegmental atelectasis. There are no signs of alveolar pulmonary edema or new focal pulmonary consolidation. Electronically Signed   By: PElmer PickerM.D.   On: 06/21/2022 10:27   NM Renal Imaging Flow W/Pharm  Result Date: 06/04/2022 CLINICAL DATA:  Hydronephrosis ileal conduit anatomy 5.1 EXAM: NUCLEAR MEDICINE RENAL SCAN WITH DIURETIC ADMINISTRATION TECHNIQUE: Radionuclide angiographic and sequential renal images were obtained after intravenous injection of radiopharmaceutical. Imaging was continued during slow intravenous injection of Lasix approximately 15 minutes after the start of the examination. RADIOPHARMACEUTICALS:  5.1 mCi Technetium-[id number] IV COMPARISON:  CT 05/05/2022, 04/11/2022 FINDINGS: Flow:  Prompt symmetric arterial flow to the kidneys. Left renogram: Prompt LEFT renal cortical radiotracer uptake. Counts are excreted promptly into the LEFT renal pelvis. The LEFT renal pelvis is dilated as seen on comparison CT. Post administration of Lasix, contrast does begin to clear the  patulous LEFT renal pelvis. Small amount postvoid residual in the LEFT upper pole and patulous renal pelvis. No high-grade obstruction. Right renogram: Uniform uptake of counts in the  renal cortex. Counts are promptly excreted into the collecting system and cleared prior to administration of Lasix. Lasix augment clearance. No postvoid residual. Differential: Left kidney = 51 % Right kidney = 48 % T1/2 post Lasix : Left kidney = 22 min Right kidney = 9.3 min IMPRESSION: 1. While the LEFT renal pelvis is patulous, there is no evidence of high-grade obstruction. Mild postvoid residual in the patulous LEFT renal pelvis and upper pole. Essentially normal functioning LEFT kidney. 2. Normal RIGHT kidney. Electronically Signed   By: Suzy Bouchard M.D.   On: 06/04/2022 13:33    Microbiology: Results for orders placed or performed during the hospital encounter of 06/21/22  Resp Panel by RT-PCR (Flu A&B, Covid) Anterior Nasal Swab     Status: None   Collection Time: 06/21/22  9:25 AM   Specimen: Anterior Nasal Swab  Result Value Ref Range Status   SARS Coronavirus 2 by RT PCR NEGATIVE NEGATIVE Final    Comment: (NOTE) SARS-CoV-2 target nucleic acids are NOT DETECTED.  The SARS-CoV-2 RNA is generally detectable in upper respiratory specimens during the acute phase of infection. The lowest concentration of SARS-CoV-2 viral copies this assay can detect is 138 copies/mL. A negative result does not preclude SARS-Cov-2 infection and should not be used as the sole basis for treatment or other patient management decisions. A negative result may occur with  improper specimen collection/handling, submission of specimen other than nasopharyngeal swab, presence of viral mutation(s) within the areas targeted by this assay, and inadequate number of viral copies(<138 copies/mL). A negative result must be combined with clinical observations, patient history, and epidemiological information. The expected result is Negative.  Fact Sheet for Patients:  EntrepreneurPulse.com.au  Fact Sheet for Healthcare Providers:  IncredibleEmployment.be  This test is no t  yet approved or cleared by the [place] FDA and  has been authorized for detection and/or diagnosis of SARS-CoV-2 by FDA under an Emergency Use Authorization (EUA). This EUA will remain  in effect (meaning this test can be used) for the duration of the COVID-19 declaration under Section 564(b)(1) of the Act, 21 U.S.C.section 360bbb-3(b)(1), unless the authorization is terminated  or revoked sooner.       Influenza A by PCR NEGATIVE NEGATIVE Final   Influenza B by PCR NEGATIVE NEGATIVE Final    Comment: (NOTE) The Xpert Xpress SARS-CoV-2/FLU/RSV plus assay is intended as an aid in the diagnosis of influenza from Nasopharyngeal swab specimens and should not be used as a sole basis for treatment. Nasal washings and aspirates are unacceptable for Xpert Xpress SARS-CoV-2/FLU/RSV testing.  Fact Sheet for Patients: EntrepreneurPulse.com.au  Fact Sheet for Healthcare Providers: IncredibleEmployment.be  This test is not yet approved or cleared by the [place] FDA and has been authorized for detection and/or diagnosis of SARS-CoV-2 by FDA under an Emergency Use Authorization (EUA). This EUA will remain in effect (meaning this test can be used) for the duration of the COVID-19 declaration under Section 564(b)(1) of the Act, 21 U.S.C. section 360bbb-3(b)(1), unless the authorization is terminated or revoked.  Performed at [facility], [address], [place], [place] [address]   Culture, body fluid w Gram Stain-bottle     Status: None (Preliminary result)   Collection Time: 06/21/22  3:00 PM   Specimen: Pleura  Result Value Ref Range Status   Specimen  Description PLEURAL  Final   Special Requests   Final    BOTTLES DRAWN AEROBIC AND ANAEROBIC Blood Culture adequate volume   Culture   Final    NO GROWTH < 24 HOURS Performed at [facility], [address], [place], [place] [address]    Report Status PENDING  Incomplete  Gram stain      Status: None   Collection Time: 06/21/22  3:00 PM   Specimen: Pleura  Result Value Ref Range Status   Specimen Description PLEURAL  Final   Special Requests NONE  Final   Gram Stain   Final    WBC PRESENT,BOTH PMN AND MONONUCLEAR NO ORGANISMS SEEN CYTOSPIN SMEAR Performed at [facility], [address], [place], [place] [address]    Report Status 06/21/2022 FINAL  Final    Labs: CBC: Recent Labs  Lab 06/21/22 0922 06/22/22 0456  WBC 10.3 9.8  NEUTROABS 8.7*  --   HGB 13.8 13.6  HCT 41.3 40.1  MCV 106.7* 105.0*  PLT 249 779   Basic Metabolic Panel: Recent Labs  Lab 06/21/22 0922 06/22/22 0456  NA 140 142  K 4.1 4.0  CL 105 104  CO2 29 27  GLUCOSE 101* 97  BUN 23 22  CREATININE 1.09 1.04  CALCIUM 9.1 9.1   Liver Function Tests: Recent Labs  Lab 06/21/22 1945  AST 16  ALT 25  ALKPHOS 60  BILITOT 1.0  PROT 6.4*  ALBUMIN 3.1*   CBG: No results for input(s): "GLUCAP" in the last 168 hours.  Discharge time spent: greater than 30 minutes.  Signed: Orson Eva, MD [facility] 06/22/2022

## 2022-06-22 NOTE — TOC Progression Note (Addendum)
  Transition of Care [facility]) Screening Note   Patient Details  Name: [name] Date of Birth: 1941/04/03   Transition of Care [facility]) CM/SW Contact:    Shade Flood, LCSW Phone Number: 06/22/2022, 8:57 AM    Referral sent to [facility] outpatient PT.  Transition of Care Department [facility]) has reviewed patient and no TOC needs have been identified at this time. We will continue to monitor patient advancement through interdisciplinary progression rounds. If new patient transition needs arise, please place a TOC consult.

## 2022-06-22 NOTE — Telephone Encounter (Signed)
-----   Message from [name], MD sent at 06/22/2022  4:11 PM EST ----- He can reschedule for anytime next week or after ----- Message ----- From: Rolene Course, RN Sent: 06/22/2022   3:36 PM EST To: [name], MD   ----- Message ----- From: Rolene Course, RN Sent: 06/22/2022   9:46 AM EST To: [name], MD  This patient's daughter called, he is admitted at [facility].  So he will not be here for his appointment tomorrow, they are asking if rescheduling for next week is the best option with his tx plan.  Please advise.

## 2022-06-22 NOTE — Progress Notes (Signed)
Ng Discharge Note  Admit Date:  06/21/2022 Discharge date: 06/22/2022   [name] to be D/C'd Home per MD order.  AVS completed. Patient/caregiver able to verbalize understanding.  Discharge Medication: Allergies as of 06/22/2022       Reactions   Ciprofloxacin Hives   Zofran [ondansetron] Rash        Medication List     STOP taking these medications    HYDROmorphone 4 MG tablet Commonly known as: DILAUDID       TAKE these medications    acetaminophen 500 MG tablet Commonly known as: TYLENOL Take 1,000 mg by mouth every 6 (six) hours as needed for mild pain, fever, moderate pain or headache.   apixaban 5 MG Tabs tablet Commonly known as: ELIQUIS Take 1 tablet (5 mg total) by mouth 2 (two) times daily.   clotrimazole-betamethasone cream Commonly known as: LOTRISONE Apply 1 application  topically in the morning and at bedtime.   cyanocobalamin 500 MCG tablet Commonly known as: VITAMIN B12 Take 1 tablet (500 mcg total) by mouth daily.   doxycycline 100 MG tablet Commonly known as: VIBRA-TABS Take 100 mg by mouth 2 (two) times daily.   lactose free nutrition Liqd Take 237 mLs by mouth 3 (three) times daily between meals.   levothyroxine 50 MCG tablet Commonly known as: SYNTHROID Take 50 mcg by mouth daily.   methenamine 1 g tablet Commonly known as: Hiprex Take 1 tablet (1 g total) by mouth 2 (two) times daily with a meal.   metoprolol tartrate 25 MG tablet Commonly known as: LOPRESSOR Take 1 tablet (25 mg total) by mouth 2 (two) times daily.   pantoprazole 40 MG tablet Commonly known as: PROTONIX Take 1 tablet (40 mg total) by mouth daily.   polyethylene glycol 17 g packet Commonly known as: MIRALAX / GLYCOLAX Take 17 g by mouth daily as needed for mild constipation.        Discharge Assessment: Vitals:   06/21/22 2251 06/22/22 0645  BP: 97/60 102/74  Pulse: 80 78  Resp: 19 16  Temp: 98.6 F (37 C) 97.6 F (36.4 C)  SpO2: 96% 95%    Skin clean, dry and intact without evidence of skin break down, no evidence of skin tears noted. IV catheter discontinued intact. Site without signs and symptoms of complications - no redness or edema noted at insertion site, patient denies c/o pain - only slight tenderness at site.  Dressing with slight pressure applied.  D/c Instructions-Education: Discharge instructions given to patient/family with verbalized understanding. D/c education completed with patient/family including follow up instructions, medication list, d/c activities limitations if indicated, with other d/c instructions as indicated by MD - patient able to verbalize understanding, all questions fully answered. Patient instructed to return to ED, call 911, or call MD for any changes in condition.  Patient escorted via [place], and D/C home via private auto.  Richrd Prime, LPN 36/11/6801 2:12 PM

## 2022-06-22 NOTE — Progress Notes (Incomplete)
*  PRELIMINARY RESULTS* Echocardiogram 2D Echocardiogram has been performed.  [name] 06/22/2022, 2:12 PM

## 2022-06-23 ENCOUNTER — Ambulatory Visit: Payer: Medicare Other | Admitting: Oncology

## 2022-06-23 ENCOUNTER — Other Ambulatory Visit: Payer: Medicare Other

## 2022-06-23 LAB — CYTOLOGY - NON PAP

## 2022-06-26 LAB — CULTURE, BODY FLUID W GRAM STAIN -BOTTLE
Culture: NO GROWTH
Special Requests: ADEQUATE

## 2022-06-29 DIAGNOSIS — E663 Overweight: Secondary | ICD-10-CM | POA: Diagnosis not present

## 2022-06-29 DIAGNOSIS — J90 Pleural effusion, not elsewhere classified: Secondary | ICD-10-CM | POA: Diagnosis not present

## 2022-06-29 DIAGNOSIS — R0600 Dyspnea, unspecified: Secondary | ICD-10-CM | POA: Diagnosis not present

## 2022-06-29 DIAGNOSIS — I10 Essential (primary) hypertension: Secondary | ICD-10-CM | POA: Diagnosis not present

## 2022-06-29 DIAGNOSIS — Z6827 Body mass index (BMI) 27.0-27.9, adult: Secondary | ICD-10-CM | POA: Diagnosis not present

## 2022-06-29 DIAGNOSIS — N390 Urinary tract infection, site not specified: Secondary | ICD-10-CM | POA: Diagnosis not present

## 2022-06-29 DIAGNOSIS — C7951 Secondary malignant neoplasm of bone: Secondary | ICD-10-CM | POA: Diagnosis not present

## 2022-06-29 DIAGNOSIS — J449 Chronic obstructive pulmonary disease, unspecified: Secondary | ICD-10-CM | POA: Diagnosis not present

## 2022-07-02 ENCOUNTER — Inpatient Hospital Stay (HOSPITAL_BASED_OUTPATIENT_CLINIC_OR_DEPARTMENT_OTHER): Payer: Medicare Other | Admitting: Oncology

## 2022-07-02 ENCOUNTER — Other Ambulatory Visit: Payer: Self-pay

## 2022-07-02 ENCOUNTER — Inpatient Hospital Stay: Payer: Medicare Other | Attending: Oncology

## 2022-07-02 VITALS — BP 120/66 | HR 71 | Temp 97.5°F | Resp 17 | Wt 195.4 lb

## 2022-07-02 DIAGNOSIS — C7951 Secondary malignant neoplasm of bone: Secondary | ICD-10-CM | POA: Diagnosis not present

## 2022-07-02 DIAGNOSIS — C7800 Secondary malignant neoplasm of unspecified lung: Secondary | ICD-10-CM | POA: Insufficient documentation

## 2022-07-02 DIAGNOSIS — C679 Malignant neoplasm of bladder, unspecified: Secondary | ICD-10-CM

## 2022-07-02 LAB — CMP (CANCER CENTER ONLY)
ALT: 15 U/L (ref 0–44)
AST: 13 U/L — ABNORMAL LOW (ref 15–41)
Albumin: 3.6 g/dL (ref 3.5–5.0)
Alkaline Phosphatase: 56 U/L (ref 38–126)
Anion gap: 4 — ABNORMAL LOW (ref 5–15)
BUN: 18 mg/dL (ref 8–23)
CO2: 28 mmol/L (ref 22–32)
Calcium: 9 mg/dL (ref 8.9–10.3)
Chloride: 109 mmol/L (ref 98–111)
Creatinine: 1.07 mg/dL (ref 0.61–1.24)
GFR, Estimated: >60 mL/min (ref >60)
Glucose, Bld: 124 mg/dL — ABNORMAL HIGH (ref 70–99)
Potassium: 3.9 mmol/L (ref 3.5–5.1)
Sodium: 141 mmol/L (ref 135–145)
Total Bilirubin: 0.5 mg/dL (ref 0.3–1.2)
Total Protein: 6 g/dL — ABNORMAL LOW (ref 6.5–8.1)

## 2022-07-02 LAB — CBC WITH DIFFERENTIAL (CANCER CENTER ONLY)
Abs Immature Granulocytes: 0.04 10*3/uL (ref 0.00–0.07)
Basophils Absolute: 0.1 10*3/uL (ref 0.0–0.1)
Basophils Relative: 1 %
Eosinophils Absolute: 0.1 10*3/uL (ref 0.0–0.5)
Eosinophils Relative: 1 %
HCT: 41.5 % (ref 39.0–52.0)
Hemoglobin: 14 g/dL (ref 13.0–17.0)
Immature Granulocytes: 0 %
Lymphocytes Relative: 5 %
Lymphs Abs: 0.5 10*3/uL — ABNORMAL LOW (ref 0.7–4.0)
MCH: 36.2 pg — ABNORMAL HIGH (ref 26.0–34.0)
MCHC: 33.7 g/dL (ref 30.0–36.0)
MCV: 107.2 fL — ABNORMAL HIGH (ref 80.0–100.0)
Monocytes Absolute: 0.6 10*3/uL (ref 0.1–1.0)
Monocytes Relative: 6 %
Neutro Abs: 7.9 10*3/uL — ABNORMAL HIGH (ref 1.7–7.7)
Neutrophils Relative %: 87 %
Platelet Count: 188 10*3/uL (ref 150–400)
RBC: 3.87 MIL/uL — ABNORMAL LOW (ref 4.22–5.81)
RDW: 13.1 % (ref 11.5–15.5)
WBC Count: 9.2 10*3/uL (ref 4.0–10.5)
nRBC: 0 % (ref 0.0–0.2)

## 2022-07-02 MED ORDER — HYDROMORPHONE HCL 4 MG PO TABS: 4.0000 mg | ORAL_TABLET | ORAL | 0 refills | Status: DC | PRN

## 2022-07-02 MED ORDER — OXYCODONE HCL 5 MG PO TABS: 5.0000 mg | ORAL_TABLET | ORAL | 0 refills | Status: DC | PRN

## 2022-07-02 NOTE — Progress Notes (Signed)
Hematology and Oncology Follow Up   CLENTON [name] [id number] [birth year] 81 y.o. 07/02/2022 10:00 AM [name], MDFusco, Lance Nails, MD       Principle Diagnosis: 81 year old man with bladder cancer diagnosed in 2018.  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  His tumor is PDL 1 positive  with CPS score over 90%.    Prior Therapy:    He is status post neoadjuvant chemotherapy utilizing gemcitabine and cisplatin started in September 2018.  He received day 1 of cycle 1 on 05/02/2017 and therapy discontinued after that.   He is status post laparoscopic Cystoprostatectomy and bilateral lymphadenectomy done by Dr. Tresa Moore on 06/29/2017. Final pathology showed a T3bN0 disease.  He has 13 lymph nodes sampled without any evidence of malignancy.   He completed radiation therapy to the pelvis on January 16, 2018 under the care of Dr. Tammi Klippel.     Carboplatin and gemcitabine he is status post 6 cycles of therapy completed on 05/22/2018.   Pembrolizumab 200 mg every 3 weeks started on 06/21/2018.  He completed 8 cycles of therapy and April 2020.  Therapy discontinued because of dermatological toxicities.   He is status post radiation therapy to the right paratracheal lymph node for a total of 50 Gray in 10 fractions completed on April 23, 2020.  He is status post definitive radiation to intrathoracic lymph node completed on November 27, 2021.  He received 40 Gray in 10 fractions.    Current therapy:  Active surveillance.   Interim History: [name] is here for a follow-up visit.  Since last visit, he was hospitalized up till June 22, 2022 due to symptoms of shortness of breath.  He was found to have pleural effusion with the cytology showed no malignant cells at that time.  Since his discharge, he reports feeling well without any major complaints.  He denies any nausea vomiting or abdominal pain.  His shortness of breath has resolved  at this time.  He denies any fevers or chills.  Performance status is back to baseline.    Medications: Updated on review. Current Outpatient Medications  Medication Sig Dispense Refill   acetaminophen (TYLENOL) 500 MG tablet Take 1,000 mg by mouth every 6 (six) hours as needed for mild pain, fever, moderate pain or headache.     apixaban (ELIQUIS) 5 MG TABS tablet Take 1 tablet (5 mg total) by mouth 2 (two) times daily. 60 tablet 2   clotrimazole-betamethasone (LOTRISONE) cream Apply 1 application  topically in the morning and at bedtime.     doxycycline (VIBRA-TABS) 100 MG tablet Take 100 mg by mouth 2 (two) times daily.     lactose free nutrition (BOOST) LIQD Take 237 mLs by mouth 3 (three) times daily between meals.      levothyroxine (SYNTHROID) 50 MCG tablet Take 50 mcg by mouth daily. (Patient not taking: Reported on 06/21/2022)     methenamine (HIPREX) 1 g tablet Take 1 tablet (1 g total) by mouth 2 (two) times daily with a meal. 60 tablet 11   metoprolol tartrate (LOPRESSOR) 25 MG tablet Take 1 tablet (25 mg total) by mouth 2 (two) times daily. 60 tablet 2   pantoprazole (PROTONIX) 40 MG tablet Take 1 tablet (40 mg total) by mouth daily. 30 tablet 1   polyethylene glycol (MIRALAX / GLYCOLAX) 17 g packet Take 17 g by mouth daily as needed for mild constipation. 14 each 0   vitamin B-12 (VITAMIN B12)  500 MCG tablet Take 1 tablet (500 mcg total) by mouth daily.     Current Facility-Administered Medications  Medication Dose Route Frequency Provider Last Rate Last Admin   omalizumab Arvid Right) prefilled syringe 300 mg  300 mg Subcutaneous Q14 Days Valentina Shaggy, MD   300 mg at 06/07/22 1040     Allergies:  Allergies  Allergen Reactions   Ciprofloxacin Hives   Zofran [Ondansetron] Rash   Physical exam:      Blood pressure 120/66, pulse 71, temperature (!) 97.5 F (36.4 C), temperature source Temporal, resp. rate 17, weight 195 lb 7 oz (88.6 kg), SpO2 93 %.    ECOG  1   General appearance: Alert, awake without any distress. Head: Atraumatic without abnormalities Oropharynx: Without any thrush or ulcers. Eyes: No scleral icterus. Lymph nodes: No lymphadenopathy noted in the cervical, supraclavicular, or axillary nodes Heart:regular rate and rhythm, without any murmurs or gallops.   Lung: Clear to auscultation without any rhonchi, wheezes or dullness to percussion. Abdomin: Soft, nontender without any shifting dullness or ascites. Musculoskeletal: No clubbing or cyanosis. Neurological: No motor or sensory deficits. Skin: No rashes or lesions. 6          Lab Results: Lab Results  Component Value Date   WBC 9.2 07/02/2022   HGB 14.0 07/02/2022   HCT 41.5 07/02/2022   MCV 107.2 (H) 07/02/2022   PLT 188 07/02/2022     Chemistry      Component Value Date/Time   NA 142 06/22/2022 0456   NA 140 04/30/2019 1033   NA 141 05/23/2017 0828   K 4.0 06/22/2022 0456   K 4.0 05/23/2017 0828   CL 104 06/22/2022 0456   CO2 27 06/22/2022 0456   CO2 27 05/23/2017 0828   BUN 22 06/22/2022 0456   BUN 16 04/30/2019 1033   BUN 12.5 05/23/2017 0828   CREATININE 1.04 06/22/2022 0456   CREATININE 0.97 03/23/2022 1256   CREATININE 0.8 05/23/2017 0828      Component Value Date/Time   CALCIUM 9.1 06/22/2022 0456   CALCIUM 9.0 05/23/2017 0828   ALKPHOS 60 06/21/2022 1945   ALKPHOS 57 05/23/2017 0828   AST 16 06/21/2022 1945   AST 13 (L) 03/23/2022 1256   AST 13 05/23/2017 0828   ALT 25 06/21/2022 1945   ALT 11 03/23/2022 1256   ALT 15 05/23/2017 0828   BILITOT 1.0 06/21/2022 1945   BILITOT 0.5 03/23/2022 1256   BILITOT 0.37 05/23/2017 0828         Impression and Plan:  81 year old man with:    1.  Stage IV high-grade urothelial carcinoma of the bladder with pulmonary and bone involvement diagnosed in 2019.   He remains on treatment at this time after a near complete response to immunotherapy.  PET scan obtained in July 2023 did not  show any evidence of active disease.  Given his recent hospitalization and pleural effusion have recommended updating his staging scan in the near future.  Different salvage therapy options including Padcev alone or in combination with Pembrolizumab could be considered.    2. Hip pain: He is currently on oxycodone and Dilaudid with pain is manageable.   3.  Pleural effusion: Unclear etiology although malignant effusion cannot be ruled out.  Cytology has been negative and obtaining PET scan will help determine whether he has any pulmonary or pleural metastasis.   4.  Follow-up: I gave him the option to follow-up at the [facility].  Given his current location he prefers to follow-up at any pattern and we will arrange follow-up with Dr. Delton Coombes.   30  minutes were dedicated to this visit.  Time was spent on updating his disease status, treatment choices and outlining future plan of care review.  Zola Button, MD 07/02/2022 10:00 AM

## 2022-07-02 NOTE — Addendum Note (Signed)
Addended by: [name] on: 07/02/2022 10:23 AM   Modules accepted: Orders

## 2022-07-05 ENCOUNTER — Telehealth: Payer: Self-pay | Admitting: *Deleted

## 2022-07-05 NOTE — Telephone Encounter (Signed)
PC to patient, spoke to his wife, informed her patient's PET scan is scheduled for 07/15/22 at 8:30, he is to arrive at 8:00, NPO for 6 hours prior.  She verbalizes understanding.

## 2022-07-07 DIAGNOSIS — U071 COVID-19: Secondary | ICD-10-CM | POA: Diagnosis not present

## 2022-07-07 DIAGNOSIS — R0600 Dyspnea, unspecified: Secondary | ICD-10-CM | POA: Diagnosis not present

## 2022-07-07 DIAGNOSIS — C7951 Secondary malignant neoplasm of bone: Secondary | ICD-10-CM | POA: Diagnosis not present

## 2022-07-07 DIAGNOSIS — J90 Pleural effusion, not elsewhere classified: Secondary | ICD-10-CM | POA: Diagnosis not present

## 2022-07-07 DIAGNOSIS — I10 Essential (primary) hypertension: Secondary | ICD-10-CM | POA: Diagnosis not present

## 2022-07-07 DIAGNOSIS — Z6827 Body mass index (BMI) 27.0-27.9, adult: Secondary | ICD-10-CM | POA: Diagnosis not present

## 2022-07-07 DIAGNOSIS — J9801 Acute bronchospasm: Secondary | ICD-10-CM | POA: Diagnosis not present

## 2022-07-07 DIAGNOSIS — J449 Chronic obstructive pulmonary disease, unspecified: Secondary | ICD-10-CM | POA: Diagnosis not present

## 2022-07-12 ENCOUNTER — Other Ambulatory Visit: Payer: Self-pay

## 2022-07-12 ENCOUNTER — Emergency Department (HOSPITAL_COMMUNITY)
Admission: EM | Admit: 2022-07-12 | Discharge: 2022-07-12 | Disposition: A | Discharge status: Home / Self Care | Payer: Medicare Other | Attending: Emergency Medicine | Admitting: Emergency Medicine

## 2022-07-12 ENCOUNTER — Emergency Department (HOSPITAL_COMMUNITY): Payer: Medicare Other

## 2022-07-12 DIAGNOSIS — R0602 Shortness of breath: Secondary | ICD-10-CM

## 2022-07-12 DIAGNOSIS — I4891 Unspecified atrial fibrillation: Secondary | ICD-10-CM | POA: Insufficient documentation

## 2022-07-12 DIAGNOSIS — J90 Pleural effusion, not elsewhere classified: Secondary | ICD-10-CM | POA: Diagnosis not present

## 2022-07-12 DIAGNOSIS — Z7901 Long term (current) use of anticoagulants: Secondary | ICD-10-CM | POA: Insufficient documentation

## 2022-07-12 DIAGNOSIS — Z8551 Personal history of malignant neoplasm of bladder: Secondary | ICD-10-CM | POA: Diagnosis not present

## 2022-07-12 DIAGNOSIS — U071 COVID-19: Secondary | ICD-10-CM | POA: Insufficient documentation

## 2022-07-12 LAB — CBC WITH DIFFERENTIAL/PLATELET
Abs Immature Granulocytes: 0.04 10*3/uL (ref 0.00–0.07)
Basophils Absolute: 0.1 10*3/uL (ref 0.0–0.1)
Basophils Relative: 1 %
Eosinophils Absolute: 0.1 10*3/uL (ref 0.0–0.5)
Eosinophils Relative: 1 %
HCT: 46.4 % (ref 39.0–52.0)
Hemoglobin: 15.4 g/dL (ref 13.0–17.0)
Immature Granulocytes: 1 %
Lymphocytes Relative: 7 %
Lymphs Abs: 0.5 10*3/uL — ABNORMAL LOW (ref 0.7–4.0)
MCH: 35 pg — ABNORMAL HIGH (ref 26.0–34.0)
MCHC: 33.2 g/dL (ref 30.0–36.0)
MCV: 105.5 fL — ABNORMAL HIGH (ref 80.0–100.0)
Monocytes Absolute: 1 10*3/uL (ref 0.1–1.0)
Monocytes Relative: 13 %
Neutro Abs: 5.9 10*3/uL (ref 1.7–7.7)
Neutrophils Relative %: 77 %
Platelets: 291 10*3/uL (ref 150–400)
RBC: 4.4 MIL/uL (ref 4.22–5.81)
RDW: 12.9 % (ref 11.5–15.5)
WBC: 7.6 10*3/uL (ref 4.0–10.5)
nRBC: 0 % (ref 0.0–0.2)

## 2022-07-12 LAB — BASIC METABOLIC PANEL
Anion gap: 12 (ref 5–15)
BUN: 18 mg/dL (ref 8–23)
CO2: 23 mmol/L (ref 22–32)
Calcium: 9.2 mg/dL (ref 8.9–10.3)
Chloride: 106 mmol/L (ref 98–111)
Creatinine, Ser: 1.17 mg/dL (ref 0.61–1.24)
GFR, Estimated: >60 mL/min (ref >60)
Glucose, Bld: 115 mg/dL — ABNORMAL HIGH (ref 70–99)
Potassium: 4.1 mmol/L (ref 3.5–5.1)
Sodium: 141 mmol/L (ref 135–145)

## 2022-07-12 LAB — RESP PANEL BY RT-PCR (FLU A&B, COVID) ARPGX2
Influenza A by PCR: NEGATIVE
Influenza B by PCR: NEGATIVE
SARS Coronavirus 2 by RT PCR: POSITIVE — AB

## 2022-07-12 LAB — TROPONIN I (HIGH SENSITIVITY): Troponin I (High Sensitivity): 3 ng/L (ref <18)

## 2022-07-12 MED ORDER — ALBUTEROL SULFATE HFA 108 (90 BASE) MCG/ACT IN AERS
2.0000 | INHALATION_SPRAY | Freq: Once | RESPIRATORY_TRACT | Status: AC
  Administered 2022-07-12: 2 via RESPIRATORY_TRACT
  Filled 2022-07-12: qty 6.7

## 2022-07-12 MED ORDER — SODIUM CHLORIDE 0.9 % IV BOLUS
1000.0000 mL | Freq: Once | INTRAVENOUS | Status: AC
  Administered 2022-07-12: 1000 mL via INTRAVENOUS

## 2022-07-12 NOTE — ED Provider Notes (Signed)
[id number] [facility] EMERGENCY DEPARTMENT Provider Note   CSN: [id number] Arrival date & time: 07/12/22  1449     History {Add pertinent medical, surgical, social history, OB history to HPI:1} Chief Complaint  Patient presents with   Shortness of Breath    [name] is a 81 y.o. male presenting to the emergency department with shortness of breath, diarrhea, ongoing for about 5 to 7 days.  Became sick 7 days ago, tested positive for COVID 5 days ago.  Completed an outpatient course of Paxlovid prescribed by his doctor.  He still has had a poor appetite.  He feels dehydrated.  He feels short of breath.  He has no underlying history of COPD or emphysema, but his wife at bedside reports he did have a pleural effusion that was drained in the hospital recently.  He also has a history of bladder urinary cancer and has a urostomy bag, with a history of urinary infections.  HPI     Home Medications Prior to Admission medications   Medication Sig Start Date End Date Taking? Authorizing Provider  acetaminophen (TYLENOL) 500 MG tablet Take 1,000 mg by mouth every 6 (six) hours as needed for mild pain, fever, moderate pain or headache.    [provider]  apixaban (ELIQUIS) 5 MG TABS tablet Take 1 tablet (5 mg total) by mouth 2 (two) times daily. 04/17/22   Johnson, Clanford L, MD  clotrimazole-betamethasone (LOTRISONE) cream Apply 1 application  topically in the morning and at bedtime. 06/27/20   [provider]  doxycycline (VIBRA-TABS) 100 MG tablet Take 100 mg by mouth 2 (two) times daily. 04/23/22   [provider]  HYDROmorphone (DILAUDID) 4 MG tablet Take 1 tablet (4 mg total) by mouth every 4 (four) hours as needed for severe pain. 07/02/22   [name], MD  lactose free nutrition (BOOST) LIQD Take 237 mLs by mouth 3 (three) times daily between meals.     [provider]  levothyroxine (SYNTHROID) 50 MCG tablet Take 50 mcg by mouth daily. Patient not  taking: Reported on 06/21/2022 10/01/21   [provider]  methenamine (HIPREX) 1 g tablet Take 1 tablet (1 g total) by mouth 2 (two) times daily with a meal. 06/16/22   McKenzie, Candee Furbish, MD  metoprolol tartrate (LOPRESSOR) 25 MG tablet Take 1 tablet (25 mg total) by mouth 2 (two) times daily. 04/17/22   Johnson, Clanford L, MD  oxyCODONE (OXY IR/ROXICODONE) 5 MG immediate release tablet Take 1 tablet (5 mg total) by mouth every 4 (four) hours as needed for severe pain. 07/02/22   [name], MD  pantoprazole (PROTONIX) 40 MG tablet Take 1 tablet (40 mg total) by mouth daily. 04/17/22   Johnson, Clanford L, MD  polyethylene glycol (MIRALAX / GLYCOLAX) 17 g packet Take 17 g by mouth daily as needed for mild constipation. 07/18/20   Roxan Hockey, MD  vitamin B-12 (VITAMIN B12) 500 MCG tablet Take 1 tablet (500 mcg total) by mouth daily. 06/22/22   Orson Eva, MD      Allergies    Ciprofloxacin and Zofran [ondansetron]    Review of Systems   Review of Systems  Physical Exam Updated Vital Signs BP (!) 115/52   Pulse 74   Temp (!) 96.1 F (35.6 C)   Resp (!) 24   Ht '5\' 11"'$  (1.803 m)   Wt 84.8 kg   SpO2 97%   BMI 26.08 kg/m  Physical Exam Constitutional:  General: He is not in acute distress. HENT:     Head: Normocephalic and atraumatic.  Eyes:     Conjunctiva/sclera: Conjunctivae normal.     Pupils: Pupils are equal, round, and reactive to light.  Cardiovascular:     Rate and Rhythm: Normal rate and regular rhythm.  Pulmonary:     Effort: Pulmonary effort is normal. No respiratory distress.     Comments: Rhonchi and wheezing right mid and lower lung field Normal breath sounds left lung field Patient speaking comfortably in full sentences with no retractions Abdominal:     General: There is no distension.     Tenderness: There is no abdominal tenderness.     Comments: Urostomy bag with clear/dark urine, non cloudy  Skin:    General: Skin is warm and dry.   Neurological:     General: No focal deficit present.     Mental Status: He is alert. Mental status is at baseline.  Psychiatric:        Mood and Affect: Mood normal.        Behavior: Behavior normal.     ED Results / Procedures / Treatments   Labs (all labs ordered are listed, but only abnormal results are displayed) Labs Reviewed  RESP PANEL BY RT-PCR (FLU A&B, COVID) ARPGX2 - Abnormal; Notable for the following components:      Result Value   SARS Coronavirus 2 by RT PCR POSITIVE (*)    All other components within normal limits  CBC WITH DIFFERENTIAL/PLATELET - Abnormal; Notable for the following components:   MCV 105.5 (*)    MCH 35.0 (*)    Lymphs Abs 0.5 (*)    All other components within normal limits  BASIC METABOLIC PANEL - Abnormal; Notable for the following components:   Glucose, Bld 115 (*)    All other components within normal limits  TROPONIN I (HIGH SENSITIVITY)    EKG None  Radiology DG Chest 2 View  Result Date: 07/12/2022 CLINICAL DATA:  Short of breath since yesterday. Patient tested positive for COVID-19 one week ago. EXAM: CHEST - 2 VIEW COMPARISON:  06/21/2022. FINDINGS: Cardiac silhouette is normal in size. No mediastinal or hilar masses. No evidence of adenopathy. Trace pleural effusions significantly decreased from the prior exams. Clear lungs. No pneumothorax. Skeletal structures are intact. IMPRESSION: No active cardiopulmonary disease. Electronically Signed   By: Lajean Manes M.D.   On: 07/12/2022 16:13    Procedures Procedures  {Document cardiac monitor, telemetry assessment procedure when appropriate:1}  Medications Ordered in ED Medications - No data to display  ED Course/ Medical Decision Making/ A&P                           Medical Decision Making Amount and/or Complexity of Data Reviewed Labs: ordered.   This patient presents to the ED with concern for ***. This involves an extensive number of treatment options, and is a  complaint that carries with it a high risk of complications and morbidity.  The differential diagnosis includes ***  Co-morbidities that complicate the patient evaluation: ***  Additional history obtained from ***  External records from outside source obtained and reviewed including ***  I ordered and personally interpreted labs.  The pertinent results include:  ***  I ordered imaging studies including *** I independently visualized and interpreted imaging which showed *** I agree with the radiologist interpretation  The patient was maintained on a cardiac monitor.  I personally  viewed and interpreted the cardiac monitored which showed an underlying rhythm of: ***  Per my interpretation the patient's ECG shows ***  I ordered medication including ***  for *** I have reviewed the patients home medicines and have made adjustments as needed  Test Considered: ***  I requested consultation with the ***,  and discussed lab and imaging findings as well as pertinent plan - they recommend: ***  After the interventions noted above, I reevaluated the patient and found that they have: {resolved/improved/worsened:23923::"improved"}  Social Determinants of Health:***  Dispostion:  After consideration of the diagnostic results and the patients response to treatment, I feel that the patent would benefit from ***.   {Document critical care time when appropriate:1} {Document review of labs and clinical decision tools ie heart score, Chads2Vasc2 etc:1}  {Document your independent review of radiology images, and any outside records:1} {Document your discussion with family members, caretakers, and with consultants:1} {Document social determinants of health affecting pt's care:1} {Document your decision making why or why not admission, treatments were needed:1} Final Clinical Impression(s) / ED Diagnoses Final diagnoses:  None    Rx / DC Orders ED Discharge Orders     None

## 2022-07-12 NOTE — Discharge Instructions (Addendum)
Please continue to quarantine for 10 days from the start of your symptoms.  Continue drink plenty of water at home.  Schedule follow-up appointment with your doctors office.

## 2022-07-12 NOTE — ED Triage Notes (Signed)
Pt presents with SOB since yesterday, pt tested Covid + on last Monday.

## 2022-07-12 NOTE — ED Notes (Signed)
Patient states covid last week and still feels like he's SHOB, 98% on RA. Patient also complains of diarrhea and wife states diarrhea has stopped since patient has been in the [facility].

## 2022-07-15 ENCOUNTER — Encounter (HOSPITAL_COMMUNITY): Payer: Medicare Other

## 2022-07-20 ENCOUNTER — Institutional Professional Consult (permissible substitution): Payer: Medicare Other | Admitting: Pulmonary Disease

## 2022-07-21 ENCOUNTER — Ambulatory Visit (HOSPITAL_COMMUNITY): Payer: Medicare Other | Admitting: Physical Therapy

## 2022-07-26 ENCOUNTER — Ambulatory Visit (INDEPENDENT_AMBULATORY_CARE_PROVIDER_SITE_OTHER): Payer: Medicare Other

## 2022-07-26 DIAGNOSIS — L501 Idiopathic urticaria: Secondary | ICD-10-CM

## 2022-07-28 ENCOUNTER — Ambulatory Visit (HOSPITAL_COMMUNITY)
Admission: RE | Admit: 2022-07-28 | Discharge: 2022-07-28 | Disposition: A | Discharge status: Home / Self Care | Payer: Medicare Other | Source: Ambulatory Visit | Attending: Oncology | Admitting: Oncology

## 2022-07-28 DIAGNOSIS — R911 Solitary pulmonary nodule: Secondary | ICD-10-CM | POA: Diagnosis not present

## 2022-07-28 DIAGNOSIS — I2510 Atherosclerotic heart disease of native coronary artery without angina pectoris: Secondary | ICD-10-CM | POA: Diagnosis not present

## 2022-07-28 DIAGNOSIS — J90 Pleural effusion, not elsewhere classified: Secondary | ICD-10-CM | POA: Diagnosis not present

## 2022-07-28 DIAGNOSIS — R590 Localized enlarged lymph nodes: Secondary | ICD-10-CM | POA: Diagnosis not present

## 2022-07-28 DIAGNOSIS — K7689 Other specified diseases of liver: Secondary | ICD-10-CM | POA: Insufficient documentation

## 2022-07-28 DIAGNOSIS — C679 Malignant neoplasm of bladder, unspecified: Secondary | ICD-10-CM

## 2022-07-28 DIAGNOSIS — I700 Atherosclerosis of aorta: Secondary | ICD-10-CM | POA: Insufficient documentation

## 2022-07-28 LAB — GLUCOSE, CAPILLARY: Glucose-Capillary: 102 mg/dL — ABNORMAL HIGH (ref 70–99)

## 2022-07-28 MED ORDER — FLUDEOXYGLUCOSE F - 18 (FDG) INJECTION
9.3200 | Freq: Once | INTRAVENOUS | Status: AC | PRN
  Administered 2022-07-28: 9.32 via INTRAVENOUS

## 2022-08-11 ENCOUNTER — Ambulatory Visit (INDEPENDENT_AMBULATORY_CARE_PROVIDER_SITE_OTHER): Payer: Medicare Other | Admitting: Allergy & Immunology

## 2022-08-11 ENCOUNTER — Encounter: Payer: Self-pay | Admitting: Allergy & Immunology

## 2022-08-11 ENCOUNTER — Other Ambulatory Visit: Payer: Self-pay

## 2022-08-11 VITALS — BP 132/70 | HR 83 | Temp 98.0°F | Resp 16 | Ht 71.0 in | Wt 185.0 lb

## 2022-08-11 DIAGNOSIS — L501 Idiopathic urticaria: Secondary | ICD-10-CM

## 2022-08-11 DIAGNOSIS — F1190 Opioid use, unspecified, uncomplicated: Secondary | ICD-10-CM | POA: Diagnosis not present

## 2022-08-11 MED ORDER — PREDNISONE 10 MG PO TABS: ORAL_TABLET | ORAL | 0 refills | Status: DC

## 2022-08-11 NOTE — Progress Notes (Unsigned)
FOLLOW UP  Date of Service/Encounter:  08/11/22   Assessment:   Chronic urticaria with itching - previously on every two week Xolair with some improvement (missed several doses due to being sick with COVID19)  Current urticarial flare - started prednisone today and increasing cetirizine to BID for near future   Complicated past medical history, including metastatic bladder cancer - s/p multiple chemotherapeutic regimens as well as immunotherapy and multiple rounds of radiation   Pain control with opioids    Plan/Recommendations:   1. Idiopathic urticaria - Continue with Xolair every two weeks. - Start the prednisone dose pack that I sent in.  - Continue with cetirizine '10mg'$  daily and increase to TWICE DAILY.   2. Return in about 6 months (around 02/10/2023).    Subjective:   [name] is a 81 y.o. male presenting today for follow up of  Chief Complaint  Patient presents with   Rash    Chest area x 2-3 wks - very itchy    [name] has a history of the following: Patient Active Problem List   Diagnosis Date Noted   Generalized weakness 06/21/2022   Pleural effusion 06/21/2022   Paroxysmal atrial fibrillation ([place]) 04/14/2022   C. 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 ([place]) 10/30/2021   Sepsis secondary to urinary source 07/13/2020   Pseudomonas aeruginosa infection 06/18/2020   GERD (gastroesophageal reflux disease)    Sinus tachycardia    Fever    Dehydration    Rigors    Severe sepsis ([facility])    Elevated lactic acid level    Cancer with pulmonary metastases ([place]) 03/31/2020   Adverse drug reaction 06/05/2018   Bacteremia associated with intravascular line ([place]) 04/20/2018   Febrile neutropenia ([place]) 04/13/2018   Gram positive sepsis ([place]) 04/13/2018   SIRS (systemic inflammatory response syndrome) ([place]) 04/12/2018   Bacteremia due to Gram-positive bacteria 04/12/2018    Pancytopenia ([place]) 04/12/2018   Status post chemotherapy    UTI (urinary tract infection) 04/11/2018   Port-A-Cath in place 02/20/2018   Encounter for antineoplastic chemotherapy 02/17/2018   Sepsis due to urinary tract infection ([place]) 01/25/2018   Sepsis secondary to UTI ([place]) 01/25/2018   Hyperglycemia    Goals of care, counseling/discussion 01/18/2018   Bone metastasis 12/29/2017   Status post ileal conduit due to bladder cancer 06/29/2017   Bladder cancer ([place]) 06/28/2017   Stage IV malignant neoplasm of urinary bladder /with mets to the bones and lungs 04/20/2017   COPD (chronic obstructive pulmonary disease) ([place]) 03/07/2008    History obtained from: chart review and patient.  [name] is a 81 y.o. male presenting for a sick visit. He was last seen in August 2023. At that time, we continued with Xolair every two weeks. We started him on a prednisone burst and recommended restarting a daily antihistamine.   Since the last visit, he has been stable. He did have COVID19 in November 2023 and seemed to d owell. However, he did present to the ED at the end of November with SOB, even after testing positive over 5 days before hand.   He reports that he started itching two weeks ago on his upper chest. He has been putting all types of ointments that do no good. He is behind on his Xolair for five weeks due to [place].   He is going to get another injection on Friday and then get back on track.  He denies any  new exposures.  He has tried all of his topical medications without improvement. He is still taking cetirizine one tablet daily. He never takes more than one.  His renal function is normal.  He has never been told that he cannot take antihistamines.  He is open to doing so.  He was admitted in November for an infection. Treatments are going well.  Apparently his current oncologist is going to be moving to [place] and he is establishing with Dr. Raliegh Ip in the near future.  He has done with  chemotherapy and they are just monitoring his lytic lesions in his bones from metastasis.  He had a recent scan last week and he is going to be going over this with his new oncologist soon.    Otherwise, there have been no changes to his past medical history, surgical history, family history, or social history.    Review of Systems  Constitutional:  Negative for chills, fever, malaise/fatigue and weight loss.  HENT: Negative.  Negative for congestion, ear discharge and ear pain.   Eyes:  Negative for pain, discharge and redness.  Respiratory:  Negative for cough, sputum production, shortness of breath and wheezing.   Cardiovascular: Negative.  Negative for chest pain and palpitations.  Gastrointestinal:  Negative for abdominal pain, blood in stool, diarrhea, heartburn, nausea and vomiting.  Musculoskeletal:  Negative for back pain and joint pain.  Skin:  Positive for itching and rash.  Neurological:  Negative for dizziness and headaches.  Endo/Heme/Allergies:  Negative for environmental allergies. Does not bruise/bleed easily.       Objective:   Blood pressure 132/70, pulse 83, temperature 98 F (36.7 C), temperature source Temporal, resp. rate 16, height '5\' 11"'$  (1.803 m), weight 185 lb (83.9 kg), SpO2 95 %. Body mass index is 25.8 kg/m.    Physical Exam Vitals reviewed.  Constitutional:      Appearance: He is well-developed.  HENT:     Head: Normocephalic and atraumatic.     Right Ear: Tympanic membrane, ear canal and external ear normal. No drainage, swelling or tenderness. Tympanic membrane is not injected, scarred, erythematous, retracted or bulging.     Left Ear: Tympanic membrane, ear canal and external ear normal. No drainage, swelling or tenderness. Tympanic membrane is not injected, scarred, erythematous, retracted or bulging.     Nose: No nasal deformity, septal deviation, mucosal edema or rhinorrhea.     Right Turbinates: Not enlarged or swollen.     Left  Turbinates: Not enlarged or swollen.     Right Sinus: No maxillary sinus tenderness or frontal sinus tenderness.     Left Sinus: No maxillary sinus tenderness or frontal sinus tenderness.     Mouth/Throat:     Mouth: Mucous membranes are not pale and not dry.     Pharynx: Uvula midline.  Eyes:     General:        Right eye: No discharge.        Left eye: No discharge.     Conjunctiva/sclera: Conjunctivae normal.     Right eye: Right conjunctiva is not injected. No chemosis.    Left eye: Left conjunctiva is not injected. No chemosis.    Pupils: Pupils are equal, round, and reactive to light.  Cardiovascular:     Rate and Rhythm: Normal rate and regular rhythm.     Heart sounds: Normal heart sounds.  Pulmonary:     Effort: Pulmonary effort is normal. No tachypnea, accessory muscle usage or respiratory distress.  Breath sounds: Normal breath sounds. No wheezing, rhonchi or rales.  Chest:     Chest wall: No tenderness.  Lymphadenopathy:     Head:     Right side of head: No submandibular, tonsillar or occipital adenopathy.     Left side of head: No submandibular, tonsillar or occipital adenopathy.     Cervical: No cervical adenopathy.  Skin:    General: Skin is warm.     Capillary Refill: Capillary refill takes less than 2 seconds.     Coloration: Skin is not pale.     Findings: Rash present. No abrasion, erythema or petechiae. Rash is not papular, urticarial or vesicular.     Comments: Multiple papular urticaria on his upper chest extending onto his back as well.  There is excoriation.  No honey crusting or oozing.  Neurological:     Mental Status: He is alert.  Psychiatric:        Behavior: Behavior is cooperative.      Diagnostic studies: none      Salvatore Marvel, MD  [facility]

## 2022-08-11 NOTE — Patient Instructions (Addendum)
1. Idiopathic urticaria - Continue with Xolair every two weeks. - Start the prednisone dose pack that I sent in.  - Continue with cetirizine '10mg'$  daily and increase to TWICE DAILY.   2. Return in about 6 months (around 02/10/2023).    Please inform us of any Emergency Department visits, hospitalizations, or changes in symptoms. Call us before going to the ED for breathing or allergy symptoms since we might be able to fit you in for a sick visit. Feel free to contact us anytime with any questions, problems, or concerns.  It was a pleasure to see you today!  Websites that have reliable patient information: 1. American Academy of Asthma, Allergy, and Immunology: www.aaaai.org 2. Food Allergy Research and Education (FARE): foodallergy.org 3. Mothers of Asthmatics: http://www.asthmacommunitynetwork.org 4. American College of Allergy, Asthma, and Immunology: www.acaai.org   COVID-19 Vaccine Information can be found at: ShippingScam.co.uk For questions related to vaccine distribution or appointments, please email [email or web address] or call [phone].   We realize that you might be concerned about having an allergic reaction to the COVID19 vaccines. To help with that concern, WE ARE OFFERING THE COVID19 VACCINES IN OUR OFFICE! Ask the front desk for dates!     "Like" [place] on Facebook and Instagram for our latest updates!      A healthy democracy works best when [employer] participate! Make sure you are registered to vote! If you have moved or changed any of your contact information, you will need to get this updated before voting!  In some cases, you MAY be able to register to vote online: [email or web address]

## 2022-08-12 ENCOUNTER — Encounter: Payer: Self-pay | Admitting: Allergy & Immunology

## 2022-08-13 ENCOUNTER — Ambulatory Visit (INDEPENDENT_AMBULATORY_CARE_PROVIDER_SITE_OTHER): Payer: Medicare Other

## 2022-08-13 DIAGNOSIS — L501 Idiopathic urticaria: Secondary | ICD-10-CM

## 2022-08-17 ENCOUNTER — Inpatient Hospital Stay: Payer: Medicare Other | Attending: Oncology | Admitting: Hematology

## 2022-08-17 ENCOUNTER — Encounter: Payer: Self-pay | Admitting: Hematology

## 2022-08-17 VITALS — BP 121/82 | HR 77 | Temp 97.4°F | Resp 16 | Ht 71.0 in | Wt 182.9 lb

## 2022-08-17 DIAGNOSIS — C7951 Secondary malignant neoplasm of bone: Secondary | ICD-10-CM | POA: Insufficient documentation

## 2022-08-17 DIAGNOSIS — C7800 Secondary malignant neoplasm of unspecified lung: Secondary | ICD-10-CM | POA: Diagnosis not present

## 2022-08-17 DIAGNOSIS — C679 Malignant neoplasm of bladder, unspecified: Secondary | ICD-10-CM | POA: Insufficient documentation

## 2022-08-17 DIAGNOSIS — Z87891 Personal history of nicotine dependence: Secondary | ICD-10-CM | POA: Diagnosis not present

## 2022-08-17 DIAGNOSIS — J90 Pleural effusion, not elsewhere classified: Secondary | ICD-10-CM | POA: Diagnosis not present

## 2022-08-17 NOTE — Progress Notes (Signed)
[facility] NOTE  Patient Care Team: [name], MD as PCP - General (Internal Medicine) Evans Lance, MD as PCP - Electrophysiology (Cardiology) Derek Jack, MD as Medical Oncologist (Medical Oncology) Brien Mates, RN as Oncology Nurse Navigator (Medical Oncology)  CHIEF COMPLAINTS/PURPOSE OF CONSULTATION:  Metastatic urothelial carcinoma of the bladder  HISTORY OF PRESENTING ILLNESS:  [name] 82 y.o. male is seen in consultation today at the request of Dr. Alen Blew for management of bladder cancer close to home.  He was initially diagnosed with bladder cancer and 2018 and underwent 1 cycle of gemcitabine and cisplatin in the neoadjuvant setting followed by cystoprostatectomy and bilateral lymphadenectomy by Dr. Tresa Moore on 06/29/2017 with pathology showing T3b N0, 0/13 lymph nodes involved.  He developed metastatic disease around April 2019.  He underwent radiation therapy to the pelvic lesion followed by 6 cycles of carboplatin and gemcitabine.  On progression he was treated with pembrolizumab which was stopped in April 2020 due to skin toxicity.  After that he underwent radiation to the right paratracheal lymph node and intrathoracic lymph nodes in 2021 and 2023.  He was hospitalized in August with Pseudomonas sepsis and recently in November 2023 with weakness.  He lost 10 pounds in the last 6 months.  He reports decreased appetite.  No new pains reported.  MEDICAL HISTORY:  Past Medical History:  Diagnosis Date   Bladder cancer ([place])    Bone cancer ([place])    BPH (benign prostatic hypertrophy)    Dysuria    GERD (gastroesophageal reflux disease)    History of adenomatous polyp of colon    tubular adenoma 06/ 2018   History of bladder cancer 12/2014   urologist-  dr [name]--  dx High Grade TCC without stromal invasion s/p TURBT and intravesical BCG tx/  recurrent 07/2015 (pTa) TCC  repear intravesical BCG tx    History of hiatal hernia     History of urethral stricture    Nocturia     SURGICAL HISTORY: Past Surgical History:  Procedure Laterality Date   CARDIAC CATHETERIZATION  1997   normal coronary arteries   CARDIOVASCULAR STRESS TEST  12-14-2005   Low risk perfusion study/  very small scar in the inferior septum from mid ventricle to apex,  no ischemia/  mild inferior septal hypokinesis/  ef 58%   CATARACT EXTRACTION W/ INTRAOCULAR LENS  IMPLANT, BILATERAL  2011   COLONOSCOPY  last one 06/ 2018   CYSTOSCOPY WITH BIOPSY N/A 08/12/2015   Procedure: CYSTOSCOPY WITH BIOPSY AND FULGERATION;  Surgeon: [name], MD;  Location: [facility];  Service: Urology;  Laterality: N/A;   CYSTOSCOPY WITH INJECTION N/A 06/29/2017   Procedure: CYSTOSCOPY WITH INJECTION OF INDOCYANINE GREEN DYE;  Surgeon: Alexis Frock, MD;  Location: [facility];  Service: Urology;  Laterality: N/A;   CYSTOSCOPY WITH URETHRAL DILATATION N/A 03/21/2017   Procedure: CYSTOSCOPY;  Surgeon: [name], MD;  Location: [facility];  Service: Urology;  Laterality: N/A;   ESOPHAGOGASTRODUODENOSCOPY  12-05-2014   IR IMAGING GUIDED PORT INSERTION  02/07/2018   IR NEPHROSTOMY PLACEMENT LEFT  12/25/2021   IR REMOVAL TUN ACCESS W/ PORT W/O FL MOD SED  04/14/2018   TRANSTHORACIC ECHOCARDIOGRAM  01-31-2008   nomral LVF,  ef 55-65%/  mild pulmonic stenosis without regurg.,  peak transpulomonic valve gradient 26mHg   TRANSURETHRAL RESECTION OF BLADDER TUMOR N/A 12/31/2014   Procedure: TRANSURETHRAL RESECTION OF BLADDER TUMOR (TURBT);  Surgeon: MLowella Bandy  MD;  Location: [place];  Service: Urology;  Laterality: N/A;   TRANSURETHRAL RESECTION OF BLADDER TUMOR N/A 03/21/2017   Procedure: POSSIBLE TRANSURETHRAL RESECTION OF BLADDER TUMOR (TURBT);  Surgeon: [name], MD;  Location: [facility];  Service: Urology;  Laterality: N/A;    SOCIAL HISTORY: Social History   Socioeconomic History    Marital status: Married    Spouse name: Not on file   Number of children: 1   Years of education: Not on file   Highest education level: Not on file  Occupational History   Occupation: Retired  Tobacco Use   Smoking status: Former    Packs/day: 1.00    Years: 34.00    Total pack years: 34.00    Types: Cigarettes    Quit date: 12/26/1988    Years since quitting: 33.6   Smokeless tobacco: Never  Vaping Use   Vaping Use: Never used  Substance and Sexual Activity   Alcohol use: Yes    Alcohol/week: 6.0 standard drinks of alcohol    Types: 6 Cans of beer per week    Comment: BEER   Drug use: No   Sexual activity: Not Currently  Other Topics Concern   Not on file  Social History Narrative   Not on file   Social Determinants of Health   Financial Resource Strain: Not on file  Food Insecurity: No Food Insecurity (08/17/2022)   Hunger Vital Sign    Worried About Running Out of Food in the Last Year: Never true    Ran Out of Food in the Last Year: Never true  Transportation Needs: No Transportation Needs (08/17/2022)   PRAPARE - Hydrologist (Medical): No    Lack of Transportation (Non-Medical): No  Physical Activity: Not on file  Stress: Not on file  Social Connections: Not on file  Intimate Partner Violence: Not At Risk (08/17/2022)   Humiliation, Afraid, Rape, and Kick questionnaire    Fear of Current or Ex-Partner: No    Emotionally Abused: No    Physically Abused: No    Sexually Abused: No    FAMILY HISTORY: Family History  Problem Relation Age of Onset   Alcoholism Father    Colon cancer Neg Hx    Colon polyps Neg Hx    Diabetes Neg Hx    Kidney disease Neg Hx    Esophageal cancer Neg Hx    Heart disease Neg Hx    Gallbladder disease Neg Hx    Allergic rhinitis Neg Hx    Angioedema Neg Hx    Asthma Neg Hx    Atopy Neg Hx    Eczema Neg Hx    Immunodeficiency Neg Hx    Urticaria Neg Hx     ALLERGIES:  is allergic to  ciprofloxacin and zofran [ondansetron].  MEDICATIONS:  Current Outpatient Medications  Medication Sig Dispense Refill   acetaminophen (TYLENOL) 500 MG tablet Take 1,000 mg by mouth every 6 (six) hours as needed for mild pain, fever, moderate pain or headache.     apixaban (ELIQUIS) 5 MG TABS tablet Take 1 tablet (5 mg total) by mouth 2 (two) times daily. 60 tablet 2   clotrimazole-betamethasone (LOTRISONE) cream Apply 1 application  topically in the morning and at bedtime.     doxycycline (VIBRA-TABS) 100 MG tablet Take 100 mg by mouth 2 (two) times daily.     HYDROmorphone (DILAUDID) 4 MG tablet Take 1 tablet (4 mg total) by  mouth every 4 (four) hours as needed for severe pain. 60 tablet 0   lactose free nutrition (BOOST) LIQD Take 237 mLs by mouth 3 (three) times daily between meals.      methenamine (HIPREX) 1 g tablet Take 1 tablet (1 g total) by mouth 2 (two) times daily with a meal. 60 tablet 11   metoprolol tartrate (LOPRESSOR) 25 MG tablet Take 1 tablet (25 mg total) by mouth 2 (two) times daily. 60 tablet 2   oxyCODONE (OXY IR/ROXICODONE) 5 MG immediate release tablet Take 1 tablet (5 mg total) by mouth every 4 (four) hours as needed for severe pain. 60 tablet 0   pantoprazole (PROTONIX) 40 MG tablet Take 1 tablet (40 mg total) by mouth daily. 30 tablet 1   polyethylene glycol (MIRALAX / GLYCOLAX) 17 g packet Take 17 g by mouth daily as needed for mild constipation. 14 each 0   predniSONE (DELTASONE) 10 MG tablet Take two tablets ('20mg'$ ) twice daily for three days, then one tablet ('10mg'$ ) twice daily for three days, then STOP. 18 tablet 0   Current Facility-Administered Medications  Medication Dose Route Frequency Provider Last Rate Last Admin   omalizumab Arvid Right) prefilled syringe 300 mg  300 mg Subcutaneous Q14 Days Valentina Shaggy, MD   300 mg at 08/13/22 7782    REVIEW OF SYSTEMS:   Constitutional: Denies fevers, chills or abnormal night sweats Eyes: Denies blurriness of  vision, double vision or watery eyes Ears, nose, mouth, throat, and face: Denies mucositis or sore throat Respiratory: Positive for dyspnea on exertion. Cardiovascular: Denies palpitation, chest discomfort or lower extremity swelling Gastrointestinal:  Denies nausea, heartburn or change in bowel habits Skin: Denies abnormal skin rashes Lymphatics: Denies new lymphadenopathy or easy bruising Neurological:Denies numbness, tingling or new weaknesses Behavioral/Psych: Mood is stable, no new changes  All other systems were reviewed with the patient and are negative.  PHYSICAL EXAMINATION: ECOG PERFORMANCE STATUS: 1 - Symptomatic but completely ambulatory  Vitals:   08/17/22 1326  BP: 121/82  Pulse: 77  Resp: 16  Temp: (!) 97.4 F (36.3 C)  SpO2: 98%   Filed Weights   08/17/22 1326  Weight: 182 lb 14.4 oz (83 kg)    GENERAL:alert, no distress and comfortable SKIN: skin color, texture, turgor are normal, no rashes or significant lesions EYES: normal, conjunctiva are pink and non-injected, sclera clear OROPHARYNX:no exudate, no erythema and lips, buccal mucosa, and tongue normal  NECK: supple, thyroid normal size, non-tender, without nodularity LYMPH:  no palpable lymphadenopathy in the cervical, axillary or inguinal LUNGS: Bilateral air entry with decreased breath sounds on the right lung base. HEART: regular rate & rhythm and no murmurs and no lower extremity edema ABDOMEN:abdomen soft, non-tender and normal bowel sounds.  Urostomy present. Musculoskeletal:no cyanosis of digits and no clubbing  PSYCH: alert & oriented x 3 with fluent speech NEURO: no focal motor/sensory deficits  LABORATORY DATA:  I have reviewed the data as listed Lab Results  Component Value Date   WBC 7.6 07/12/2022   HGB 15.4 07/12/2022   HCT 46.4 07/12/2022   MCV 105.5 (H) 07/12/2022   PLT 291 07/12/2022     Chemistry      Component Value Date/Time   NA 141 07/12/2022 1549   NA 140 04/30/2019  1033   NA 141 05/23/2017 0828   K 4.1 07/12/2022 1549   K 4.0 05/23/2017 0828   CL 106 07/12/2022 1549   CO2 23 07/12/2022 1549   CO2 27 05/23/2017  0828   BUN 18 07/12/2022 1549   BUN 16 04/30/2019 1033   BUN 12.5 05/23/2017 0828   CREATININE 1.17 07/12/2022 1549   CREATININE 1.07 07/02/2022 0935   CREATININE 0.8 05/23/2017 0828      Component Value Date/Time   CALCIUM 9.2 07/12/2022 1549   CALCIUM 9.0 05/23/2017 0828   ALKPHOS 56 07/02/2022 0935   ALKPHOS 57 05/23/2017 0828   AST 13 (L) 07/02/2022 0935   AST 13 05/23/2017 0828   ALT 15 07/02/2022 0935   ALT 15 05/23/2017 0828   BILITOT 0.5 07/02/2022 0935   BILITOT 0.37 05/23/2017 0828       RADIOGRAPHIC STUDIES: I have personally reviewed the radiological images as listed and agreed with the findings in the report. NM PET Image Restag (PS) Skull Base To Thigh  Result Date: 07/29/2022 CLINICAL DATA:  Subsequent treatment strategy for invasive bladder cancer. EXAM: NUCLEAR MEDICINE PET SKULL BASE TO THIGH TECHNIQUE: 9.32 mCi F-18 FDG was injected intravenously. Full-ring PET imaging was performed from the skull base to thigh after the radiotracer. CT data was obtained and used for attenuation correction and anatomic localization. Fasting blood glucose: 102 mg/dl COMPARISON:  PET-CT 03/15/2022. Chest CTA 06/21/2022 and abdominopelvic CT 05/05/2022. FINDINGS: Mediastinal blood pool activity: SUV max 3.0 NECK: No hypermetabolic cervical lymph nodes are identified. No suspicious activity identified within the pharyngeal mucosal space. Incidental CT findings: none CHEST: 7 mm subcarinal node has an SUV max of 5.1, previously 2.7. Previously noted low-level hilar activity bilaterally has decreased. There is increased nodularity anterior to the left hilum measuring 1.3 x 1.0 cm on image 32/7, but no significant associated hypermetabolic activity (SUV max 3.3). Previously demonstrated left upper lobe pulmonary nodule appears minimally  larger than on previous CT, measuring 1.1 x 0.6 cm on image 17/7 with activity similar to blood pool (SUV max 1.7). This nodule has not changed in appearance from multiple previous CTs dating back to 01/24/2019. Incidental CT findings: Stable bandlike scarring at the right lung apex. Chronic small to moderate right pleural effusion appears unchanged. Left pleural effusion has resolved. Atherosclerosis of the aorta, great vessels and coronary arteries. ABDOMEN/PELVIS: There is no hypermetabolic activity within the liver, adrenal glands, spleen or pancreas. There is no hypermetabolic nodal activity in the abdomen or pelvis. Unchanged focal anorectal metabolic activity (SUV max 7.4) without clear CT correlate, likely physiologic. Incidental CT findings: Stable hepatic cysts. Stable postsurgical changes related to prior cystectomy and urinary diversion. Aortic and branch vessel atherosclerosis. SKELETON: The chronic expansile lytic lesion involving the right inferior pubic ramus and ischium appears unchanged, measuring 4.8 x 3.5 cm on image 204/4. This shows no recurrent hypermetabolic activity. No new hypermetabolic osseous lesions are identified. Incidental CT findings: Mild spondylosis.  Bilateral gynecomastia. IMPRESSION: 1. No evidence of local recurrence of bladder cancer in the abdomen or pelvis. 2. Stable chronic expansile lytic lesion involving the right inferior pubic ramus and ischium consistent with treated metastatic disease. No new osseous lesions are identified. 3. Minimally progressed activity within a subcarinal node which is unchanged in size. Increased ill-defined nodularity anterior to the left hilum without hypermetabolic activity. These chest findings are indeterminate, and attention on follow-up CT in 6 months recommended. 4. Stable chronic right pleural effusion. 5.  Aortic Atherosclerosis (ICD10-I70.0). Electronically Signed   By: Richardean Sale M.D.   On: 07/29/2022 10:02     ASSESSMENT:  1.  Metastatic urothelial carcinoma of the bladder to the lungs and bones: - Neoadjuvant chemotherapy with  gemcitabine and cisplatin cycle 1 on 05/02/2017 - Cystoprostatectomy, bilateral lymphadenectomy by Dr. Tresa Moore on 06/29/2017, pathology T3b N0, 0/13 lymph nodes - Developed metastatic disease in April 2019, biopsy of the right ischium on 12/08/2017 with poorly differentiated carcinoma consistent with metastatic urothelial carcinoma. - XRT to the pelvis completed on 01/16/2018 - Carboplatin and gemcitabine 6 cycles from 01/30/2018 through 05/22/2018 - Pembrolizumab every 3 weeks started on 06/21/2018, discontinued in April 2020 due to skin toxicity - XRT to the right paratracheal lymph node, 50 Gray in 10 fractions completed on 04/23/2020 - XRT to the intrathoracic lymph node completed on 11/27/2021  2.  Social/family history: - Lives at home with his wife. - Worked for [employer].  Quit smoking 40 years ago. - No family history of malignancies.  PLAN:  1.  Metastatic urothelial carcinoma of the bladder to the lungs and bones: - Reviewed PET scan open 07/28/2022): With no evidence of recurrence.  Stable lytic lesion in the right inferior pubic ramus and ischium.  The subcarinal lymph node size is stable at 7 mm, SUV 5.1, previously 2.7.  Stable chronic right pleural effusion. - No indication for treatment at this time. - Recommend follow-up in 6 months with repeat PET scan. - If there is any progression, would recommend rebiopsy and NGS panel.  2.  Deconditioning: - Recent hospitalization on 06/21/2022 for generalized weakness and worsening dyspnea.  Prior to that he was hospitalized in August with Pseudomonas sepsis. - Right thoracentesis was done on 06/21/2022 with 1.3 L removed.  Cytology negative for malignant cells. - He has lost about 10 pounds in the last 6 months and has decreased appetite.  Also feels weak and has leg pains on walking. - He will start  physical therapy tomorrow which will likely help. - If he gets worsening shortness of breath prior to his next visit, he was told to call us.  We will arrange for x-ray and see if he needs another thoracentesis.   Orders Placed This Encounter  Procedures   NM PET Image Restag (PS) Skull Base To Thigh    Standing Status:   Future    Standing Expiration Date:   08/17/2023    Order Specific Question:   If indicated for the ordered procedure, I authorize the administration of a radiopharmaceutical per Radiology protocol    Answer:   Yes    Order Specific Question:   Preferred imaging location?    Answer:   [facility]    Order Specific Question:   Release to patient    Answer:   Immediate   CBC with Differential    Standing Status:   Future    Standing Expiration Date:   08/17/2023   Comprehensive metabolic panel    Standing Status:   Future    Standing Expiration Date:   08/17/2023    All questions were answered. The patient knows to call the clinic with any problems, questions or concerns.      Derek Jack, MD 08/17/2022 5:05 PM

## 2022-08-17 NOTE — Patient Instructions (Addendum)
[facility]  Discharge Instructions  You were seen and examined today by Dr. Delton Coombes. Dr. Delton Coombes is a medical oncologist, meaning that he specializes in the treatment of cancer diagnoses. Dr. Delton Coombes discussed your past medical history, family history of cancers, and the events that led to you being here today.  You were referred to Dr. Delton Coombes for ongoing care of your urothelial cancer.  Dr. Delton Coombes has discussed the results of your PET scan which is stable. Dr. Delton Coombes has recommended a follow-up in 6 months with a PET scan prior.  Follow-up as scheduled.  Thank you for choosing [facility] to provide your oncology and hematology care.   To afford each patient quality time with our provider, please arrive at least 15 minutes before your scheduled appointment time. You may need to reschedule your appointment if you arrive late (10 or more minutes). Arriving late affects you and other patients whose appointments are after yours.  Also, if you miss three or more appointments without notifying the office, you may be dismissed from the clinic at the provider's discretion.    Again, thank you for choosing [facility].  Our hope is that these requests will decrease the amount of time that you wait before being seen by our physicians.   If you have a lab appointment with the [facility] please come in thru the Main Entrance and check in at the main information desk.           _____________________________________________________________  Should you have questions after your visit to [facility], please contact our office at [phone] and follow the prompts.  Our office hours are 8:00 a.m. to 4:30 p.m. Monday - Thursday and 8:00 a.m. to 2:30 p.m. Friday.  Please note that voicemails left after 4:00 p.m. may not be returned until the following business day.  We are closed weekends and all major  holidays.  You do have access to a nurse 24-7, just call the main number to the clinic [phone] and do not press any options, hold on the line and a nurse will answer the phone.    For prescription refill requests, have your pharmacy contact our office and allow 72 hours.    Masks are optional in the cancer centers. If you would like for your care team to wear a mask while they are taking care of you, please let them know. You may have one support person who is at least 82 years old accompany you for your appointments.

## 2022-08-18 ENCOUNTER — Ambulatory Visit (HOSPITAL_COMMUNITY): Payer: Medicare Other | Attending: Internal Medicine

## 2022-08-18 DIAGNOSIS — R531 Weakness: Secondary | ICD-10-CM | POA: Diagnosis not present

## 2022-08-18 DIAGNOSIS — R262 Difficulty in walking, not elsewhere classified: Secondary | ICD-10-CM | POA: Diagnosis not present

## 2022-08-18 DIAGNOSIS — M6281 Muscle weakness (generalized): Secondary | ICD-10-CM | POA: Diagnosis not present

## 2022-08-18 DIAGNOSIS — J90 Pleural effusion, not elsewhere classified: Secondary | ICD-10-CM | POA: Insufficient documentation

## 2022-08-18 NOTE — Therapy (Signed)
OUTPATIENT PHYSICAL THERAPY LOWER EXTREMITY EVALUATION   Patient Name: [name] MRN: [id number] DOB:03-14-1941, 82 y.o., male Today's Date: 08/18/2022  END OF SESSION:  PT End of Session - 08/18/22 1030     Visit Number 1    Number of Visits 8    Date for PT Re-Evaluation 09/15/22    Authorization Type Medicare Part A; [employer] MCR    Progress Note Due on Visit 10    PT Start Time 1030    PT Stop Time 1114    PT Time Calculation (min) 44 min    Activity Tolerance Patient limited by fatigue    Behavior During Therapy [facility] for tasks assessed/performed             Past Medical History:  Diagnosis Date   Bladder cancer ([place])    Bone cancer ([place])    BPH (benign prostatic hypertrophy)    Dysuria    GERD (gastroesophageal reflux disease)    History of adenomatous polyp of colon    tubular adenoma 06/ 2018   History of bladder cancer 12/2014   urologist-  dr [name]--  dx High Grade TCC without stromal invasion s/p TURBT and intravesical BCG tx/  recurrent 07/2015 (pTa) TCC  repear intravesical BCG tx    History of hiatal hernia    History of urethral stricture    Nocturia    Past Surgical History:  Procedure Laterality Date   CARDIAC CATHETERIZATION  1997   normal coronary arteries   CARDIOVASCULAR STRESS TEST  12-14-2005   Low risk perfusion study/  very small scar in the inferior septum from mid ventricle to apex,  no ischemia/  mild inferior septal hypokinesis/  ef 58%   CATARACT EXTRACTION W/ INTRAOCULAR LENS  IMPLANT, BILATERAL  2011   COLONOSCOPY  last one 06/ 2018   CYSTOSCOPY WITH BIOPSY N/A 08/12/2015   Procedure: CYSTOSCOPY WITH BIOPSY AND FULGERATION;  Surgeon: [name], MD;  Location: [facility];  Service: Urology;  Laterality: N/A;   CYSTOSCOPY WITH INJECTION N/A 06/29/2017   Procedure: CYSTOSCOPY WITH INJECTION OF INDOCYANINE GREEN DYE;  Surgeon: Alexis Frock, MD;  Location[facility];  Service: Urology;  Laterality: N/A;    CYSTOSCOPY WITH URETHRAL DILATATION N/A 03/21/2017   Procedure: CYSTOSCOPY;  Surgeon: [name], MD;  Location: [facility];  Service: Urology;  Laterality: N/A;   ESOPHAGOGASTRODUODENOSCOPY  12-05-2014   IR IMAGING GUIDED PORT INSERTION  02/07/2018   IR NEPHROSTOMY PLACEMENT LEFT  12/25/2021   IR REMOVAL TUN ACCESS W/ PORT W/O FL MOD SED  04/14/2018   TRANSTHORACIC ECHOCARDIOGRAM  01-31-2008   nomral LVF,  ef 55-65%/  mild pulmonic stenosis without regurg.,  peak transpulomonic valve gradient 66mHg   TRANSURETHRAL RESECTION OF BLADDER TUMOR N/A 12/31/2014   Procedure: TRANSURETHRAL RESECTION OF BLADDER TUMOR (TURBT);  Surgeon: MLowella Bandy MD;  [facility]  Service: Urology;  Laterality: N/A;   TRANSURETHRAL RESECTION OF BLADDER TUMOR N/A 03/21/2017   Procedure: POSSIBLE TRANSURETHRAL RESECTION OF BLADDER TUMOR (TURBT);  Surgeon: BNickie Retort MD;  [facility]  Service: Urology;  Laterality: N/A;   Patient Active Problem List   Diagnosis Date Noted   Generalized weakness 06/21/2022   Pleural effusion 06/21/2022   Paroxysmal atrial fibrillation ([facility] 04/14/2022   C. 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 ([facility] 10/30/2021   Sepsis secondary to urinary source 07/13/2020  Pseudomonas aeruginosa infection 06/18/2020   GERD (gastroesophageal reflux disease)    Sinus tachycardia    Fever    Dehydration    Rigors    Severe sepsis (HCC)    Elevated lactic acid level    Cancer with pulmonary metastases (HCC) 03/31/2020   Adverse drug reaction 06/05/2018   Bacteremia associated with intravascular line ([place]) 04/20/2018   Febrile neutropenia ([place]) 04/13/2018   Gram positive sepsis ([place]) 04/13/2018   SIRS (systemic inflammatory response syndrome) ([place]) 04/12/2018   Bacteremia due to Gram-positive bacteria 04/12/2018   Pancytopenia ([place]) 04/12/2018    Status post chemotherapy    UTI (urinary tract infection) 04/11/2018   Port-A-Cath in place 02/20/2018   Encounter for antineoplastic chemotherapy 02/17/2018   Sepsis due to urinary tract infection ([place]) 01/25/2018   Sepsis secondary to UTI ([place]) 01/25/2018   Hyperglycemia    Goals of care, counseling/discussion 01/18/2018   Bone metastasis 12/29/2017   Status post ileal conduit due to bladder cancer 06/29/2017   Bladder cancer ([place]) 06/28/2017   Stage IV malignant neoplasm of urinary bladder /with mets to the bones and lungs 04/20/2017   COPD (chronic obstructive pulmonary disease) ([place]) 03/07/2008    PCP: Dr. [name], MD  REFERRING PROVIDER: Dr. Orson Eva, MD  REFERRING DIAG:  Diagnosis  R53.1 (ICD-10-CM) - Generalized weakness  J90 (ICD-10-CM) - Pleural effusion    THERAPY DIAG:  Muscle weakness (generalized)  Rationale for Evaluation and Treatment: Rehabilitation  ONSET DATE: 3-4 months ago  SUBJECTIVE:   SUBJECTIVE STATEMENT: Legs are weak; been going on since hospitalization 3-4 months ago for an infection; laid around a lot afterwards so now am getting out breath and my legs get tired quickly.  My lungs were checked yesterday and look good.  Will have another PET scan in June; not currently taking any cancer treatments.  Urostomy present; bladder cancer 5 years ago.  PERTINENT HISTORY: Urostomy PAIN:  Are you having pain? No  PRECAUTIONS: None  WEIGHT BEARING RESTRICTIONS: No  FALLS:  Has patient fallen in last 6 months? No  LIVING ENVIRONMENT: Lives with: lives with their spouse Lives in: House/apartment Stairs: Yes: Internal: 8 steps; on right going up Has following equipment at home: Single point cane, Walker - 2 wheeled, and chair lift  OCCUPATION: retired  PLOF: Independent  PATIENT GOALS: get my legs stronger  NEXT MD VISIT: June; cancer doctor  OBJECTIVE:   DIAGNOSTIC FINDINGS: none of legs  PATIENT SURVEYS:  LEFS  51.2%  COGNITION: Overall cognitive status: Within functional limits for tasks assessed     SENSATION: WFL  EDEMA:  None reported   LOWER EXTREMITY MMT:  MMT Right eval Left eval  Hip flexion 4+ 4+  Hip extension    Hip abduction    Hip adduction    Hip internal rotation    Hip external rotation    Knee flexion    Knee extension 4+ 4+  Ankle dorsiflexion 4 4  Ankle plantarflexion    Ankle inversion    Ankle eversion     (Blank rows = not tested)   FUNCTIONAL TESTS:  5 times sit to stand: 16.8 sec 8/10 fatigue 2 minute walk test: 336 ft 6/10 fatigue  GAIT: Distance walked: 336 ft Assistive device utilized: None Level of assistance: Modified independence Comments: slower than normal gait speed   TODAY'S TREATMENT:  DATE: 08/18/2022 physical therapy evaluation and HEP instruction    PATIENT EDUCATION:  Education details: Patient educated on exam findings, POC, scope of PT, HEP, and walking program. Person educated: Patient Education method: Explanation, Demonstration, and Handouts Education comprehension: verbalized understanding, returned demonstration, verbal cues required, and tactile cues required   HOME EXERCISE PROGRAM: Access Code: [email or web address] URL: [email or web address] Date: 08/18/2022 Prepared by: AP - Rehab  Exercises - Sit to Stand  - 2 x daily - 7 x weekly - 1 sets - 5 reps - Seated Long Arc Quad  - 2 x daily - 7 x weekly - 2 sets - 10 reps - Seated March  - 2 x daily - 7 x weekly - 2 sets - 10 reps - Seated Heel Toe Raises  - 2 x daily - 7 x weekly - 2 sets - 10 reps  ASSESSMENT:  CLINICAL IMPRESSION:  Patient is a 82 y.o. male who presents to physical therapy with complaint of leg weakness. Patient demonstrates muscle weakness, reduced cardiopulmonary endurance, and fascial restrictions which are likely  contributing to symptoms of fatigue and weakness and are negatively impacting patient ability to perform ADLs and functional mobility tasks. Patient will benefit from skilled physical therapy services to address these deficits to reduce pain and improve level of function with ADLs and functional mobility tasks.   OBJECTIVE IMPAIRMENTS: cardiopulmonary status limiting activity, decreased activity tolerance, decreased endurance, decreased mobility, difficulty walking, decreased strength, and impaired perceived functional ability.   ACTIVITY LIMITATIONS: carrying, lifting, standing, squatting, stairs, locomotion level, and caring for others  PARTICIPATION LIMITATIONS: cleaning, shopping, community activity, and yard work  [employer] POTENTIAL: Good  CLINICAL DECISION MAKING: Stable/uncomplicated  EVALUATION COMPLEXITY: Low   GOALS: Goals reviewed with patient? No  SHORT TERM GOALS: Target date: 09/01/2022 patient will be independent with initial HEP  Baseline: Goal status: INITIAL  2.  Patient will increase the distance on his 2 MWT to 360 ft to demonstrate improved functional gait and mobility Baseline: 336 ft Goal status: INITIAL  LONG TERM GOALS: Target date: 09/15/2022  Patient will be independent in self management strategies to improve quality of life and functional outcomes.  Baseline:  Goal status: INITIAL  2.  Patient will increase the distance on his 2 MWT to 400 ft ft to demonstrate improved functional gait and mobility Baseline: 336 ft Goal status: INITIAL  3.  Patient will improve LEFS score by 5 points to demonstrate improved functional mobility   Baseline: 41/80 Goal status: INITIAL  4.  Patient will increase  leg MMTs to 5/5 without pain to promote return to ambulation community distances with minimal deviation.  Baseline: see above Goal status: INITIAL  PLAN:  PT FREQUENCY: 2x/week  PT DURATION: 4 weeks  PLANNED INTERVENTIONS: Therapeutic exercises,  Therapeutic activity, Neuromuscular re-education, Balance training, Gait training, Patient/Family education, Joint manipulation, Joint mobilization, Stair training, Orthotic/Fit training, DME instructions, Aquatic Therapy, Dry Needling, Electrical stimulation, Spinal manipulation, Spinal mobilization, Cryotherapy, Moist heat, Compression bandaging, scar mobilization, Splintting, Taping, Traction, Ultrasound, Ionotophoresis '4mg'$ /ml Dexamethasone, and Manual therapy   PLAN FOR NEXT SESSION: Review HEP and goals; focus on lower extremity strengthening, walking program, cardiopulmonary endurance; Nustep   11:06 AM, 08/18/22 Britlee Skolnik Small Linley Moskal MPT [facility] [place] [phone] Ph:[phone]

## 2022-08-23 ENCOUNTER — Telehealth: Payer: Self-pay | Admitting: Dietician

## 2022-08-23 ENCOUNTER — Ambulatory Visit: Payer: Medicare Other

## 2022-08-23 ENCOUNTER — Inpatient Hospital Stay: Payer: Medicare Other | Admitting: Dietician

## 2022-08-23 NOTE — Progress Notes (Signed)
See telephone note.

## 2022-08-23 NOTE — Telephone Encounter (Signed)
Nutrition Assessment   Reason for Assessment: Referral (wt loss, decreased appetite)   ASSESSMENT: 82 year old male with urothelial carcinoma of bladder (diagnosed 2018) s/p neoadjuvant chemotherapy followed by cystoprostatectomy and bilateral lymphadenectomy by Dr. Tresa Moore on 06/29/17. Patient with progression to bone and lung in 2019. Pembrolizumab discontinued in 2020 due to skin toxicity. He underwent radiation to right paratracheal lymph node and intrathoracic lymph nodes in 2021 and 2023. Recent imaging with no evidence of recurrence. Patient is currently under surveillance under the care of Dr. Delton Coombes.   Past medical history includes GERD, BPH, hiatal hernia, dysuria  Noted recent hospitalizations 11/6-11/7 with generalized weakness, increased dyspnea s/p thoracentesis yielding 1.3 L 8/27-9/2 with pseudomonas sepsis due to urinary source  Briefly spoke with wife via telephone. She is appreciative of call. Wife reports patient currently not at home. She request RD to call back in a couple of hours. Wife reports patient is drinking too many Pepsi's, recalls 3-4/day. He takes a few sips of water with his medications.   Spoke with patient this afternoon via telephone. He reports not eating much. Says this has been ongoing for the last year. Patient denies dysphagia. Patient recalls eating 3 small meals day. He went to breakfast this morning with friends and ate half ham/tomato biscuit. Patient is supplementing with Boost. He drinks one everyday. Patient recalls 3 (16 ounce) Pepsi. He hardly drinks water. Patient denies nausea, vomiting, diarrhea, constipation.   Nutrition Focused Physical Exam: unable to complete - telephone visit   Medications: eliquis, dilaudid, hiprex, lopressor, oxycodone, protonix, miralax   Labs: 11/27 labs reviewed    Anthropometrics: Weights have decreased 9.5% (19 lbs) in 5 months - this is severe for time frame  Height: 5'11" Weight: 182 lb 14.4 oz  UBW:  201 lb (August 23) BMI: 25.51    NUTRITION DIAGNOSIS: Unintentional weight loss related to metastatic bladder cancer as evidenced by reported decreased appetite, 9.5% wt loss in 5 months - severe for time frame   Suspect patient meets criteria for degree of malnutrition however unable to identify without completing NFPE   INTERVENTION:  Educated on importance of adequate calorie and protein energy intake to maintain strength Discussed strategies for increasing calories and protein and suggested having bedtime snack  Encouraged soft moist high protein foods for ease of intake Patient does not like for Ensure or milk, recommend increasing Boost 2x/day  Educated on empty calories from soda. Patient agreeable to not drink soda during meal time to promote increased food intake Contact information given  Will mail handouts    MONITORING, EVALUATION, GOAL: Patient will tolerate increased calories and protein to promote weight gain   Next Visit: via telephone ~6 weeks

## 2022-08-25 ENCOUNTER — Ambulatory Visit (INDEPENDENT_AMBULATORY_CARE_PROVIDER_SITE_OTHER): Payer: Medicare Other

## 2022-08-25 ENCOUNTER — Ambulatory Visit (INDEPENDENT_AMBULATORY_CARE_PROVIDER_SITE_OTHER): Payer: Medicare Other | Admitting: Pulmonary Disease

## 2022-08-25 ENCOUNTER — Encounter: Payer: Self-pay | Admitting: Pulmonary Disease

## 2022-08-25 VITALS — BP 122/78 | HR 73 | Ht 71.0 in | Wt 185.8 lb

## 2022-08-25 DIAGNOSIS — R0602 Shortness of breath: Secondary | ICD-10-CM | POA: Diagnosis not present

## 2022-08-25 DIAGNOSIS — J90 Pleural effusion, not elsewhere classified: Secondary | ICD-10-CM | POA: Diagnosis not present

## 2022-08-25 MED ORDER — AZITHROMYCIN 250 MG PO TABS: ORAL_TABLET | ORAL | 0 refills | Status: DC

## 2022-08-25 NOTE — Progress Notes (Signed)
[name]    [id number]    July 10, 1941  Primary Care Physician:Fusco, Purcell Nails, MD  Referring Physician: [name], [address] [place],  [place] [address]  Chief complaint:   Patient with cough, shortness of breath  HPI:  Cough with shortness of breath  Was treated with doxycycline in October 2023 Did have a thoracentesis performed for 1.3 L of fluid -Negative cytology  Heart radiation treatment to paratracheal lymph node in 2021 and April 2023 History of bladder cancer for which he is on treatment  He had a PET scan in December showing recurrence of right pleural effusion  He had been told about bilateral pleural effusions when he had his previous thoracentesis but the left side was not drained at the time  The PET scan in December did not reveal significant pleural effusion on the left  Has had about 2 to 3 weeks of coughing  Reformed smoker quit about 30 to 40 years ago, was not labeled with any lung disease  Recent thoracentesis was significant for exudative effusion  Stated he can walk up to half a mile if he chooses to But has been more sedentary lately    Outpatient Encounter Medications as of 08/25/2022  Medication Sig   acetaminophen (TYLENOL) 500 MG tablet Take 1,000 mg by mouth every 6 (six) hours as needed for mild pain, fever, moderate pain or headache.   apixaban (ELIQUIS) 5 MG TABS tablet Take 1 tablet (5 mg total) by mouth 2 (two) times daily.   clotrimazole-betamethasone (LOTRISONE) cream Apply 1 application  topically in the morning and at bedtime.   HYDROmorphone (DILAUDID) 4 MG tablet Take 1 tablet (4 mg total) by mouth every 4 (four) hours as needed for severe pain.   lactose free nutrition (BOOST) LIQD Take 237 mLs by mouth 3 (three) times daily between meals.    methenamine (HIPREX) 1 g tablet Take 1 tablet (1 g total) by mouth 2 (two) times daily with a meal.   metoprolol tartrate (LOPRESSOR) 25 MG tablet Take 1  tablet (25 mg total) by mouth 2 (two) times daily.   oxyCODONE (OXY IR/ROXICODONE) 5 MG immediate release tablet Take 1 tablet (5 mg total) by mouth every 4 (four) hours as needed for severe pain.   pantoprazole (PROTONIX) 40 MG tablet Take 1 tablet (40 mg total) by mouth daily.   polyethylene glycol (MIRALAX / GLYCOLAX) 17 g packet Take 17 g by mouth daily as needed for mild constipation.   predniSONE (DELTASONE) 10 MG tablet Take two tablets ('20mg'$ ) twice daily for three days, then one tablet ('10mg'$ ) twice daily for three days, then STOP.   doxycycline (VIBRA-TABS) 100 MG tablet Take 100 mg by mouth 2 (two) times daily. (Patient not taking: Reported on 08/25/2022)   Facility-Administered Encounter Medications as of 08/25/2022  Medication   omalizumab Arvid Right) prefilled syringe 300 mg    Allergies as of 08/25/2022 - Review Complete 08/25/2022  Allergen Reaction Noted   Ciprofloxacin Hives 09/10/2014   Zofran [ondansetron] Rash 02/05/2021    Past Medical History:  Diagnosis Date   Bladder cancer ([place])    Bone cancer ([place])    BPH (benign prostatic hypertrophy)    Dysuria    GERD (gastroesophageal reflux disease)    History of adenomatous polyp of colon    tubular adenoma 06/ 2018   History of bladder cancer 12/2014   urologist-  dr [name]--  dx High Grade TCC without stromal invasion  s/p TURBT and intravesical BCG tx/  recurrent 07/2015 (pTa) TCC  repear intravesical BCG tx    History of hiatal hernia    History of urethral stricture    Nocturia     Past Surgical History:  Procedure Laterality Date   CARDIAC CATHETERIZATION  1997   normal coronary arteries   CARDIOVASCULAR STRESS TEST  12-14-2005   Low risk perfusion study/  very small scar in the inferior septum from mid ventricle to apex,  no ischemia/  mild inferior septal hypokinesis/  ef 58%   CATARACT EXTRACTION W/ INTRAOCULAR LENS  IMPLANT, BILATERAL  2011   COLONOSCOPY  last one 06/ 2018   CYSTOSCOPY WITH BIOPSY N/A  08/12/2015   Procedure: CYSTOSCOPY WITH BIOPSY AND FULGERATION;  Surgeon: [name], MD;  Location[facility];  Service: Urology;  Laterality: N/A;   CYSTOSCOPY WITH INJECTION N/A 06/29/2017   Procedure: CYSTOSCOPY WITH INJECTION OF INDOCYANINE GREEN DYE;  Surgeon: Alexis Frock, MD;  [facility];  Service: Urology;  Laterality: N/A;   CYSTOSCOPY WITH URETHRAL DILATATION N/A 03/21/2017   Procedure: CYSTOSCOPY;  Surgeon: [name], MD;  [facility];  Service: Urology;  Laterality: N/A;   ESOPHAGOGASTRODUODENOSCOPY  12-05-2014   IR IMAGING GUIDED PORT INSERTION  02/07/2018   IR NEPHROSTOMY PLACEMENT LEFT  12/25/2021   IR REMOVAL TUN ACCESS W/ PORT W/O FL MOD SED  04/14/2018   TRANSTHORACIC ECHOCARDIOGRAM  01-31-2008   nomral LVF,  ef 55-65%/  mild pulmonic stenosis without regurg.,  peak transpulomonic valve gradient 62mHg   TRANSURETHRAL RESECTION OF BLADDER TUMOR N/A 12/31/2014   Procedure: TRANSURETHRAL RESECTION OF BLADDER TUMOR (TURBT);  Surgeon: MLowella Bandy MD;  [facility]  Service: Urology;  Laterality: N/A;   TRANSURETHRAL RESECTION OF BLADDER TUMOR N/A 03/21/2017   Procedure: POSSIBLE TRANSURETHRAL RESECTION OF BLADDER TUMOR (TURBT);  Surgeon: BNickie Retort MD;  [facility]  Service: Urology;  Laterality: N/A;    Family History  Problem Relation Age of Onset   Alcoholism Father    Colon cancer Neg Hx    Colon polyps Neg Hx    Diabetes Neg Hx    Kidney disease Neg Hx    Esophageal cancer Neg Hx    Heart disease Neg Hx    Gallbladder disease Neg Hx    Allergic rhinitis Neg Hx    Angioedema Neg Hx    Asthma Neg Hx    Atopy Neg Hx    Eczema Neg Hx    Immunodeficiency Neg Hx    Urticaria Neg Hx     Social History   Socioeconomic History   Marital status: Married    Spouse name: Not on file   Number of children: 1   Years of education: Not on file    Highest education level: Not on file  Occupational History   Occupation: Retired  Tobacco Use   Smoking status: Former    Packs/day: 1.00    Years: 34.00    Total pack years: 34.00    Types: Cigarettes    Quit date: 12/26/1988    Years since quitting: 33.6   Smokeless tobacco: Never  Vaping Use   Vaping Use: Never used  Substance and Sexual Activity   Alcohol use: Yes    Alcohol/week: 6.0 standard drinks of alcohol    Types: 6 Cans of beer per week    Comment: BEER   Drug use: No   Sexual  activity: Not Currently  Other Topics Concern   Not on file  Social History Narrative   Not on file   Social Determinants of Health   Financial Resource Strain: Not on file  Food Insecurity: No Food Insecurity (08/17/2022)   Hunger Vital Sign    Worried About Running Out of Food in the Last Year: Never true    Ran Out of Food in the Last Year: Never true  Transportation Needs: No Transportation Needs (08/17/2022)   PRAPARE - Hydrologist (Medical): No    Lack of Transportation (Non-Medical): No  Physical Activity: Not on file  Stress: Not on file  Social Connections: Not on file  Intimate Partner Violence: Not At Risk (08/17/2022)   Humiliation, Afraid, Rape, and Kick questionnaire    Fear of Current or Ex-Partner: No    Emotionally Abused: No    Physically Abused: No    Sexually Abused: No    Review of Systems  Respiratory:  Positive for cough and shortness of breath.     Vitals:   08/25/22 0936  BP: 122/78  Pulse: 73  SpO2: 98%     Physical Exam Constitutional:      Appearance: Normal appearance.  HENT:     Head: Normocephalic.     Mouth/Throat:     Mouth: Mucous membranes are moist.  Eyes:     General: No scleral icterus. Cardiovascular:     Rate and Rhythm: Normal rate and regular rhythm.     Heart sounds: No murmur heard.    No friction rub.  Pulmonary:     Effort: No respiratory distress.     Breath sounds: No stridor. No wheezing  or rhonchi.  Abdominal:     General: Abdomen is flat.     Comments: Urostomy present  Musculoskeletal:     Cervical back: No rigidity or tenderness.  Neurological:     Mental Status: He is alert.  Psychiatric:        Mood and Affect: Mood normal.    Data Reviewed: Previous chest x-ray reviewed  Most recent PET scan reviewed showing right pleural effusion, adenopathy  Assessment:  Bronchitis  Recurrent right pleural effusion-noted on last PET scan  Other known medical problems include bladder cancer-controlled at present, continues to follow-up with oncology  Radiation treatment to paratracheal adenopathy last in 2023  Subcarinal lymph node with an SUV of 5.1-being followed by oncology -Does have a PET scan scheduled for June  Plan/Recommendations: Course of antibiotics-course of azithromycin  Repeat chest x-ray shows small pleural effusion  I do not believe he would benefit from any intervention at the present time  Follow-up in 6 weeks  Encouraged to call with significant concerns     Sherrilyn Rist MD [facility] 08/25/2022, 10:12 AM  CC: [name], MD

## 2022-08-25 NOTE — Patient Instructions (Signed)
Will call you in a course of antibiotics for cough and sputum production -Likely related to bronchitis  Your PET scan did reveal buildup of fluid on the right side Chest x-ray today will also help with figuring out whether there is a lot of fluid left  If there is fluid buildup, we want to drain the fluid and send it for analysis again  Graded activities as tolerated  Call with significant concerns  Follow-up in 6 weeks

## 2022-08-27 ENCOUNTER — Ambulatory Visit (INDEPENDENT_AMBULATORY_CARE_PROVIDER_SITE_OTHER): Payer: Medicare Other

## 2022-08-27 DIAGNOSIS — L501 Idiopathic urticaria: Secondary | ICD-10-CM

## 2022-09-08 ENCOUNTER — Other Ambulatory Visit: Payer: Self-pay

## 2022-09-08 ENCOUNTER — Encounter: Payer: Self-pay | Admitting: Allergy & Immunology

## 2022-09-08 ENCOUNTER — Ambulatory Visit (INDEPENDENT_AMBULATORY_CARE_PROVIDER_SITE_OTHER): Payer: Medicare Other | Admitting: Allergy & Immunology

## 2022-09-08 VITALS — BP 122/78 | HR 70 | Temp 97.6°F | Resp 20 | Ht 71.0 in | Wt 185.0 lb

## 2022-09-08 DIAGNOSIS — L501 Idiopathic urticaria: Secondary | ICD-10-CM

## 2022-09-08 MED ORDER — PREDNISONE 10 MG PO TABS: ORAL_TABLET | ORAL | 0 refills | Status: DC

## 2022-09-08 MED ORDER — TRIAMCINOLONE ACETONIDE 0.1 % EX OINT: 1.0000 | TOPICAL_OINTMENT | Freq: Two times a day (BID) | CUTANEOUS | 0 refills | Status: DC

## 2022-09-08 MED ORDER — CETIRIZINE HCL 10 MG PO TABS: 10.0000 mg | ORAL_TABLET | Freq: Two times a day (BID) | ORAL | 0 refills | Status: DC

## 2022-09-08 NOTE — Patient Instructions (Addendum)
1. Idiopathic urticaria - Continue with Xolair every two weeks. - Start triamcinolone ointment 2-3 times daily to the left leg (avoid the face). - Restart cetirizine '10mg'$  daily TWICE DAILY.  - Start prednisone taper.  - Give [place] an update on FRIDAY.   2. Follow up as scheduled.     Please inform us of any Emergency Department visits, hospitalizations, or changes in symptoms. Call us before going to the ED for breathing or allergy symptoms since we might be able to fit you in for a sick visit. Feel free to contact us anytime with any questions, problems, or concerns.  It was a pleasure to see you today!  Websites that have reliable patient information: 1. American Academy of Asthma, Allergy, and Immunology: www.aaaai.org 2. Food Allergy Research and Education (FARE): foodallergy.org 3. Mothers of Asthmatics: http://www.asthmacommunitynetwork.org 4. American College of Allergy, Asthma, and Immunology: www.acaai.org   COVID-19 Vaccine Information can be found at: [email or web address].[email or web address].uk For questions related to vaccine distribution or appointments, please email [email or web address] or call [phone].   We realize that you might be concerned about having an allergic reaction to the COVID19 vaccines. To help with that concern, WE ARE OFFERING THE COVID19 VACCINES IN OUR OFFICE! Ask the front desk for dates!     "Like" [place] on Facebook and Instagram for our latest updates!      A healthy democracy works best when [employer] participate! Make sure you are registered to vote! If you have moved or changed any of your contact information, you will need to get this updated before voting!  In some cases, you MAY be able to register to vote online: [email or web address]

## 2022-09-08 NOTE — Progress Notes (Signed)
FOLLOW UP  Date of Service/Encounter:  09/08/22   Assessment:   Chronic urticaria with itching - previously on every two week Xolair with some improvement (missed several doses due to being sick with COVID19)   Current urticarial flare - started prednisone today and increasing cetirizine to BID for near future   Complicated past medical history, including metastatic bladder cancer - s/p multiple chemotherapeutic regimens as well as immunotherapy and multiple rounds of radiation   Pain control with opioids  Plan/Recommendations:    Patient Instructions  1. Idiopathic urticaria - Continue with Xolair every two weeks. - Start triamcinolone ointment 2-3 times daily to the left leg (avoid the face). - Restart cetirizine '10mg'$  daily TWICE DAILY.  - Start prednisone taper.  - Give [place] an update on FRIDAY.   2. Follow up as scheduled.     Subjective:   [name] is a 82 y.o. male presenting today for follow up of  Chief Complaint  Patient presents with  . Other    LEFT LEG BEEN ITCHY AND BURING AND PAIN FOR A WEEK NOW     [name] has a history of the following: Patient Active Problem List   Diagnosis Date Noted  . Generalized weakness 06/21/2022  . Pleural effusion 06/21/2022  . Paroxysmal atrial fibrillation ([place]) 04/14/2022  . C. difficile diarrhea 12/26/2021  . Pyelonephritis 12/23/2021  . Metastatic cancer to intrathoracic lymph nodes ([place]) 10/30/2021  . Sepsis secondary to urinary source 07/13/2020  . Pseudomonas aeruginosa infection 06/18/2020  . GERD (gastroesophageal reflux disease)   . Sinus tachycardia   . Fever   . Dehydration   . Rigors   . Severe sepsis ([place])   . Elevated lactic acid level   . Cancer with pulmonary metastases ([place]) 03/31/2020  . Adverse drug reaction 06/05/2018  . Bacteremia associated with intravascular line ([place]) 04/20/2018  . Febrile neutropenia ([place]) 04/13/2018  . Gram positive sepsis ([place]) 04/13/2018  . SIRS  (systemic inflammatory response syndrome) ([place]) 04/12/2018  . Bacteremia due to Gram-positive bacteria 04/12/2018  . Pancytopenia ([place]) 04/12/2018  . Status post chemotherapy   . UTI (urinary tract infection) 04/11/2018  . Port-A-Cath in place 02/20/2018  . Encounter for antineoplastic chemotherapy 02/17/2018  . Sepsis due to urinary tract infection ([place]) 01/25/2018  . Sepsis secondary to UTI ([place]) 01/25/2018  . Hyperglycemia   . Goals of care, counseling/discussion 01/18/2018  . Bone metastasis 12/29/2017  . Status post ileal conduit due to bladder cancer 06/29/2017  . Bladder cancer ([place]) 06/28/2017  . Stage IV malignant neoplasm of urinary bladder /with mets to the bones and lungs 04/20/2017  . COPD (chronic obstructive pulmonary disease) ([place]) 03/07/2008    History obtained from: chart review and patient.  [name] is a 82 y.o. male presenting for a sick visit.  {Blank single:19197::"Asthma/Respiratory Symptom History: ***"," "}  {Blank single:19197::"Allergic Rhinitis Symptom History: ***"," "}  {Blank single:19197::"Food Allergy Symptom History: ***"," "}  {Blank single:19197::"Skin Symptom History: ***"," "}  {Blank single:19197::"GERD Symptom History: ***"," "}  Otherwise, there have been no changes to his past medical history, surgical history, family history, or social history.    ROS     Objective:   Blood pressure 122/78, pulse 70, temperature 97.6 F (36.4 C), resp. rate 20, height '5\' 11"'$  (1.803 m), weight 185 lb (83.9 kg), SpO2 97 %. Body mass index is 25.8 kg/m.    Physical Exam   Diagnostic studies: {Blank single:19197::"none","deferred due to recent antihistamine use","labs sent instead"," "}  Spirometry: {Blank single:19197::"results normal (FEV1: ***%, FVC: ***%, FEV1/FVC: ***%)","results abnormal (FEV1: ***%, FVC: ***%, FEV1/FVC: ***%)"}.    {Blank single:19197::"Spirometry consistent with mild obstructive disease","Spirometry consistent with  moderate obstructive disease","Spirometry consistent with severe obstructive disease","Spirometry consistent with possible restrictive disease","Spirometry consistent with mixed obstructive and restrictive disease","Spirometry uninterpretable due to technique","Spirometry consistent with normal pattern"}. {Blank single:19197::"Albuterol/Atrovent nebulizer","Xopenex/Atrovent nebulizer","Albuterol nebulizer","Albuterol four puffs via MDI","Xopenex four puffs via MDI"} treatment given in clinic with {Blank single:19197::"significant improvement in FEV1 per ATS criteria","significant improvement in FVC per ATS criteria","significant improvement in FEV1 and FVC per ATS criteria","improvement in FEV1, but not significant per ATS criteria","improvement in FVC, but not significant per ATS criteria","improvement in FEV1 and FVC, but not significant per ATS criteria","no improvement"}.  Allergy Studies: {Blank single:19197::"none","labs sent instead"," "}    {Blank single:19197::"Allergy testing results were read and interpreted by myself, documented by clinical staff."," "}      Salvatore Marvel, MD  [facility] [place] [facility]

## 2022-09-09 ENCOUNTER — Encounter: Payer: Self-pay | Admitting: Allergy & Immunology

## 2022-09-13 ENCOUNTER — Ambulatory Visit (INDEPENDENT_AMBULATORY_CARE_PROVIDER_SITE_OTHER): Payer: Medicare Other

## 2022-09-13 DIAGNOSIS — L501 Idiopathic urticaria: Secondary | ICD-10-CM | POA: Diagnosis not present

## 2022-09-15 ENCOUNTER — Ambulatory Visit (HOSPITAL_COMMUNITY): Payer: Medicare Other | Admitting: Physical Therapy

## 2022-09-15 DIAGNOSIS — R262 Difficulty in walking, not elsewhere classified: Secondary | ICD-10-CM | POA: Diagnosis not present

## 2022-09-15 DIAGNOSIS — R531 Weakness: Secondary | ICD-10-CM | POA: Diagnosis not present

## 2022-09-15 DIAGNOSIS — M6281 Muscle weakness (generalized): Secondary | ICD-10-CM

## 2022-09-15 DIAGNOSIS — J90 Pleural effusion, not elsewhere classified: Secondary | ICD-10-CM | POA: Diagnosis not present

## 2022-09-15 NOTE — Therapy (Signed)
OUTPATIENT PHYSICAL THERAPY TREATMENT Progress Note Reporting Period 08/18/2022 to 09/15/2022  See note below for Objective Data and Assessment of Progress/Goals.      Patient Name: [name] MRN: [id number] DOB:1941-08-09, 82 y.o., male Today's Date: 09/15/2022  END OF SESSION:  PT End of Session - 09/15/22 0954     Visit Number 2    Number of Visits 8    Date for PT Re-Evaluation 10/14/22    Authorization Type Medicare Part A; [employer] MCR    Progress Note Due on Visit 10    PT Start Time 0950    PT Stop Time 1030    PT Time Calculation (min) 40 min    Activity Tolerance Patient limited by fatigue    Behavior During Therapy [facility] for tasks assessed/performed             Past Medical History:  Diagnosis Date   Bladder cancer ([place])    Bone cancer ([place])    BPH (benign prostatic hypertrophy)    Dysuria    GERD (gastroesophageal reflux disease)    History of adenomatous polyp of colon    tubular adenoma 06/ 2018   History of bladder cancer 12/2014   urologist-  dr [name]--  dx High Grade TCC without stromal invasion s/p TURBT and intravesical BCG tx/  recurrent 07/2015 (pTa) TCC  repear intravesical BCG tx    History of hiatal hernia    History of urethral stricture    Nocturia    Past Surgical History:  Procedure Laterality Date   CARDIAC CATHETERIZATION  1997   normal coronary arteries   CARDIOVASCULAR STRESS TEST  12-14-2005   Low risk perfusion study/  very small scar in the inferior septum from mid ventricle to apex,  no ischemia/  mild inferior septal hypokinesis/  ef 58%   CATARACT EXTRACTION W/ INTRAOCULAR LENS  IMPLANT, BILATERAL  2011   COLONOSCOPY  last one 06/ 2018   CYSTOSCOPY WITH BIOPSY N/A 08/12/2015   Procedure: CYSTOSCOPY WITH BIOPSY AND FULGERATION;  Surgeon: [name], MD;  Location[facility];  Service: Urology;  Laterality: N/A;   CYSTOSCOPY WITH INJECTION N/A 06/29/2017   Procedure: CYSTOSCOPY WITH  INJECTION OF INDOCYANINE GREEN DYE;  Surgeon: Alexis Frock, MD;  Location: [facility];  Service: Urology;  Laterality: N/A;   CYSTOSCOPY WITH URETHRAL DILATATION N/A 03/21/2017   Procedure: CYSTOSCOPY;  Surgeon: [name], MD;  Location: [facility];  Service: Urology;  Laterality: N/A;   ESOPHAGOGASTRODUODENOSCOPY  12-05-2014   IR IMAGING GUIDED PORT INSERTION  02/07/2018   IR NEPHROSTOMY PLACEMENT LEFT  12/25/2021   IR REMOVAL TUN ACCESS W/ PORT W/O FL MOD SED  04/14/2018   TRANSTHORACIC ECHOCARDIOGRAM  01-31-2008   nomral LVF,  ef 55-65%/  mild pulmonic stenosis without regurg.,  peak transpulomonic valve gradient 94mHg   TRANSURETHRAL RESECTION OF BLADDER TUMOR N/A 12/31/2014   Procedure: TRANSURETHRAL RESECTION OF BLADDER TUMOR (TURBT);  Surgeon: MLowella Bandy MD;  [facility]  Service: Urology;  Laterality: N/A;   TRANSURETHRAL RESECTION OF BLADDER TUMOR N/A 03/21/2017   Procedure: POSSIBLE TRANSURETHRAL RESECTION OF BLADDER TUMOR (TURBT);  Surgeon: BNickie Retort MD;  [facility]  Service: Urology;  Laterality: N/A;   Patient Active Problem List   Diagnosis Date Noted   Generalized weakness 06/21/2022   Pleural effusion 06/21/2022   Paroxysmal atrial fibrillation ([place] 04/14/2022   C. difficile diarrhea 12/26/2021   Pyelonephritis  12/23/2021   Metastatic cancer to intrathoracic lymph nodes ([place]) 10/30/2021   Sepsis secondary to urinary source 07/13/2020   Pseudomonas aeruginosa infection 06/18/2020   GERD (gastroesophageal reflux disease)    Sinus tachycardia    Fever    Dehydration    Rigors    Severe sepsis ([facility])    Elevated lactic acid level    Cancer with pulmonary metastases ([facility]) 03/31/2020   Adverse drug reaction 06/05/2018   Bacteremia associated with intravascular line ([place]) 04/20/2018   Febrile neutropenia ([place]) 04/13/2018   Gram positive sepsis ([place]) 04/13/2018   SIRS (systemic inflammatory  response syndrome) ([place]) 04/12/2018   Bacteremia due to Gram-positive bacteria 04/12/2018   Pancytopenia ([place]) 04/12/2018   Status post chemotherapy    UTI (urinary tract infection) 04/11/2018   Port-A-Cath in place 02/20/2018   Encounter for antineoplastic chemotherapy 02/17/2018   Sepsis due to urinary tract infection ([place]) 01/25/2018   Sepsis secondary to UTI ([place]) 01/25/2018   Hyperglycemia    Goals of care, counseling/discussion 01/18/2018   Bone metastasis 12/29/2017   Status post ileal conduit due to bladder cancer 06/29/2017   Bladder cancer ([place]) 06/28/2017   Stage IV malignant neoplasm of urinary bladder /with mets to the bones and lungs 04/20/2017   COPD (chronic obstructive pulmonary disease) ([place]) 03/07/2008    PCP: Dr. [name], MD  REFERRING PROVIDER: Dr. Orson Eva, MD  REFERRING DIAG:  Diagnosis  R53.1 (ICD-10-CM) - Generalized weakness  J90 (ICD-10-CM) - Pleural effusion    THERAPY DIAG:  Muscle weakness (generalized)  Difficulty in walking, not elsewhere classified  Rationale for Evaluation and Treatment: Rehabilitation  ONSET DATE: 3-4 months ago  SUBJECTIVE:   SUBJECTIVE STATEMENT: Pt returns today following evaluation 4 weeks prior.  States he was not given any appts until today.  Reports compliance with HEP given.  States he still gets tired easily and is difficult to get out of a chair.  No pain reported.  Evaluation:Legs are weak; been going on since hospitalization 3-4 months ago for an infection; laid around a lot afterwards so now am getting out breath and my legs get tired quickly.  My lungs were checked yesterday and look good.  Will have another PET scan in June; not currently taking any cancer treatments.  Urostomy present; bladder cancer 5 years ago.  PERTINENT HISTORY: Urostomy PAIN:  Are you having pain? No  PRECAUTIONS: None  WEIGHT BEARING RESTRICTIONS: No  FALLS:  Has patient fallen in last 6 months? No  LIVING  ENVIRONMENT: Lives with: lives with their spouse Lives in: House/apartment Stairs: Yes: Internal: 8 steps; on right going up Has following equipment at home: Single point cane, Walker - 2 wheeled, and chair lift  OCCUPATION: retired  PLOF: Independent  PATIENT GOALS: get my legs stronger  NEXT MD VISIT: June; cancer doctor  OBJECTIVE:   DIAGNOSTIC FINDINGS: none of legs  PATIENT SURVEYS:  LEFS 51.2%  COGNITION: Overall cognitive status: Within functional limits for tasks assessed     SENSATION: WFL  EDEMA:  None reported   LOWER EXTREMITY MMT:  MMT Right eval Left eval  Hip flexion 4+ 4+  Hip extension    Hip abduction    Hip adduction    Hip internal rotation    Hip external rotation    Knee flexion    Knee extension 4+ 4+  Ankle dorsiflexion 4 4  Ankle plantarflexion    Ankle inversion    Ankle eversion     (Blank rows =  not tested)   FUNCTIONAL TESTS:  09/15/2022 5 times sit to stand: 20 sec 6/10 fatigue (was 16.8 sec 8/10 fatigue at evaluation) 2 minute walk test:  339 feet 6/10 fatigue(was 336 ft 6/10 fatigue at evaluation)  Evaluation:  5 times sit to stand: 16.8 sec 8/10 fatigue 2 minute walk test: 336 ft 6/10 fatigue  GAIT: Distance walked: 336 ft at evaluation Assistive device utilized: None Level of assistance: Modified independence Comments: slower than normal gait speed   TODAY'S TREATMENT:                                                                                                                              DATE:  8/75/64 Re-cert; functional test measures Goal review 5 times sit to stand: 20 sec 6/10 fatigue (was 16.8 sec 8/10 fatigue at evaluation) 2 minute walk test:  339 feet 6/10 fatigue(was 336 ft 6/10 fatigue at evaluation) Sit to stands 10X from standard chair no UE Sitting:  HEP review LAQ, march, heel/toe raise Standing: with bil UE assist heelraise 20X  Toeraise 20X  Hip abduction 10X Hip extension  10X Marching 10X Nustep seat 10, arms 8, level 1 5 minutes (60-70 SPM)  08/18/2022  physical therapy evaluation and HEP instruction    PATIENT EDUCATION:  Education details: Patient educated on exam findings, POC, scope of PT, HEP, and walking program. Person educated: Patient Education method: Consulting civil engineer, Demonstration, and Handouts Education comprehension: verbalized understanding, returned demonstration, verbal cues required, and tactile cues required   HOME EXERCISE PROGRAM: Access Code: [email or web address] URL: [email or web address] Date: 08/18/2022 Prepared by: AP - Rehab Exercises - Sit to Stand  - 2 x daily - 7 x weekly - 1 sets - 5 reps - Seated Long Arc Quad  - 2 x daily - 7 x weekly - 2 sets - 10 reps - Seated March  - 2 x daily - 7 x weekly - 2 sets - 10 reps - Seated Heel Toe Raises  - 2 x daily - 7 x weekly - 2 sets - 10 reps   Date: 09/15/2022 Prepared by: Roseanne Reno Exercises - Sit to Stand  - 2 x daily - 7 x weekly - 1 sets - 5 reps - Seated Long Arc Quad  - 2 x daily - 7 x weekly - 2 sets - 10 reps - Standing Marching  - 2 x daily - 7 x weekly - 2 sets - 10 reps - Standing Heel Raise with Support  - 2 x daily - 7 x weekly - 2 sets - 10 reps - Standing Hip Abduction with Counter Support  - 2 x daily - 7 x weekly - 2 sets - 10 reps - Standing Hip Extension with Counter Support  - 2 x daily - 7 x weekly - 2 sets - 10 reps   ASSESSMENT:  CLINICAL IMPRESSION: Pt returns following evaluation 4 weeks prior to continue therapy for LE weakness.  Pt with slower  sit to stand time as compared to evaluation but slightly further distance on 2 minute walk test.  Reviewed goals with progression but none met at this point.  Progressed with standing strengthening exercises.  Pt does fatigue quickly requiring short rest breaks between exercises. Updated HEP to include added exercises this session.  Began nustep to help work on activity tolerance in addition to ambulation  instructed for home.  Patient will continue to benefit from skilled physical therapy services to address deficits and improve level of function with ADLs and functional mobility tasks.   OBJECTIVE IMPAIRMENTS: cardiopulmonary status limiting activity, decreased activity tolerance, decreased endurance, decreased mobility, difficulty walking, decreased strength, and impaired perceived functional ability.   ACTIVITY LIMITATIONS: carrying, lifting, standing, squatting, stairs, locomotion level, and caring for others  PARTICIPATION LIMITATIONS: cleaning, shopping, community activity, and yard work  [employer] POTENTIAL: Good  CLINICAL DECISION MAKING: Stable/uncomplicated  EVALUATION COMPLEXITY: Low   GOALS: Goals reviewed with patient? Yes  SHORT TERM GOALS: Target date: 09/01/2022 patient will be independent with initial HEP  Baseline: Goal status: IN PROGRESS  2.  Patient will increase the distance on his 2 MWT to 360 ft to demonstrate improved functional gait and mobility Baseline: 336 ft Goal status: IN PROGRESS  LONG TERM GOALS: Target date: 09/15/2022  Patient will be independent in self management strategies to improve quality of life and functional outcomes.  Baseline:  Goal status: IN PROGRESS  2.  Patient will increase the distance on his 2 MWT to 400 ft ft to demonstrate improved functional gait and mobility Baseline: 336 ft Goal status: IN PROGRESS  3.  Patient will improve LEFS score by 5 points to demonstrate improved functional mobility   Baseline: 41/80 Goal status: IN PROGRESS  4.  Patient will increase  leg MMTs to 5/5 without pain to promote return to ambulation community distances with minimal deviation.  Baseline: see above Goal status: IN PROGRESS  PLAN:  PT FREQUENCY: 2x/week  PT DURATION: 4 weeks  PLANNED INTERVENTIONS: Therapeutic exercises, Therapeutic activity, Neuromuscular re-education, Balance training, Gait training, Patient/Family  education, Joint manipulation, Joint mobilization, Stair training, Orthotic/Fit training, DME instructions, Aquatic Therapy, Dry Needling, Electrical stimulation, Spinal manipulation, Spinal mobilization, Cryotherapy, Moist heat, Compression bandaging, scar mobilization, Splintting, Taping, Traction, Ultrasound, Ionotophoresis '4mg'$ /ml Dexamethasone, and Manual therapy   PLAN FOR NEXT SESSION: Review HEP and goals; focus on lower extremity strengthening, walking program, cardiopulmonary endurance; Nustep   9:55 AM, 09/15/22 Teena Irani, PTA/CLT [place] Ph: [phone]

## 2022-09-16 NOTE — Addendum Note (Signed)
Addended byKarn Cassis S on: 09/16/2022 08:08 AM   Modules accepted: Orders

## 2022-09-17 ENCOUNTER — Encounter: Payer: Self-pay | Admitting: Urology

## 2022-09-17 ENCOUNTER — Ambulatory Visit (INDEPENDENT_AMBULATORY_CARE_PROVIDER_SITE_OTHER): Payer: Medicare Other | Admitting: Urology

## 2022-09-17 ENCOUNTER — Ambulatory Visit (HOSPITAL_COMMUNITY): Payer: Medicare Other | Attending: Internal Medicine

## 2022-09-17 VITALS — BP 116/73 | HR 75

## 2022-09-17 DIAGNOSIS — R262 Difficulty in walking, not elsewhere classified: Secondary | ICD-10-CM | POA: Insufficient documentation

## 2022-09-17 DIAGNOSIS — N390 Urinary tract infection, site not specified: Secondary | ICD-10-CM | POA: Diagnosis not present

## 2022-09-17 DIAGNOSIS — N1330 Unspecified hydronephrosis: Secondary | ICD-10-CM

## 2022-09-17 DIAGNOSIS — M6281 Muscle weakness (generalized): Secondary | ICD-10-CM | POA: Diagnosis not present

## 2022-09-17 DIAGNOSIS — Z87440 Personal history of urinary (tract) infections: Secondary | ICD-10-CM | POA: Diagnosis not present

## 2022-09-17 LAB — URINALYSIS, ROUTINE W REFLEX MICROSCOPIC
Bilirubin, UA: NEGATIVE
Glucose, UA: NEGATIVE
Ketones, UA: NEGATIVE
Leukocytes,UA: NEGATIVE
Nitrite, UA: POSITIVE — AB
Protein,UA: NEGATIVE
Specific Gravity, UA: 1.015 (ref 1.005–1.030)
Urobilinogen, Ur: 0.2 mg/dL (ref 0.2–1.0)
pH, UA: 6.5 (ref 5.0–7.5)

## 2022-09-17 LAB — MICROSCOPIC EXAMINATION

## 2022-09-17 MED ORDER — METHENAMINE HIPPURATE 1 G PO TABS: 1.0000 g | ORAL_TABLET | Freq: Two times a day (BID) | ORAL | 11 refills | Status: DC

## 2022-09-17 NOTE — Progress Notes (Signed)
09/17/2022 10:38 AM   [name] 04-15-1941 [id number]  Referring provider: [name], MD [address] [place],  [place] [address]  Followup recurrent UTI   HPI: [name] is a 82yo here for followup for recurrent UTI. No UTIs in the past 3 onths. He is on hiprex 1g BID. No fevers. Urine clear. No flank pain   PMH: Past Medical History:  Diagnosis Date   Bladder cancer ([place])    Bone cancer ([place])    BPH (benign prostatic hypertrophy)    Dysuria    GERD (gastroesophageal reflux disease)    History of adenomatous polyp of colon    tubular adenoma 06/ 2018   History of bladder cancer 12/2014   urologist-  dr [name]--  dx High Grade TCC without stromal invasion s/p TURBT and intravesical BCG tx/  recurrent 07/2015 (pTa) TCC  repear intravesical BCG tx    History of hiatal hernia    History of urethral stricture    Nocturia     Surgical History: Past Surgical History:  Procedure Laterality Date   CARDIAC CATHETERIZATION  1997   normal coronary arteries   CARDIOVASCULAR STRESS TEST  12-14-2005   Low risk perfusion study/  very small scar in the inferior septum from mid ventricle to apex,  no ischemia/  mild inferior septal hypokinesis/  ef 58%   CATARACT EXTRACTION W/ INTRAOCULAR LENS  IMPLANT, BILATERAL  2011   COLONOSCOPY  last one 06/ 2018   CYSTOSCOPY WITH BIOPSY N/A 08/12/2015   Procedure: CYSTOSCOPY WITH BIOPSY AND FULGERATION;  Surgeon: [name], MD;  Location: [facility];  Service: Urology;  Laterality: N/A;   CYSTOSCOPY WITH INJECTION N/A 06/29/2017   Procedure: CYSTOSCOPY WITH INJECTION OF INDOCYANINE GREEN DYE;  Surgeon: Alexis Frock, MD;  Location: [facility];  Service: Urology;  Laterality: N/A;   CYSTOSCOPY WITH URETHRAL DILATATION N/A 03/21/2017   Procedure: CYSTOSCOPY;  Surgeon: [name], MD;  Location: [facility];  Service: Urology;  Laterality: N/A;   ESOPHAGOGASTRODUODENOSCOPY  12-05-2014    IR IMAGING GUIDED PORT INSERTION  02/07/2018   IR NEPHROSTOMY PLACEMENT LEFT  12/25/2021   IR REMOVAL TUN ACCESS W/ PORT W/O FL MOD SED  04/14/2018   TRANSTHORACIC ECHOCARDIOGRAM  01-31-2008   nomral LVF,  ef 55-65%/  mild pulmonic stenosis without regurg.,  peak transpulomonic valve gradient 89mHg   TRANSURETHRAL RESECTION OF BLADDER TUMOR N/A 12/31/2014   Procedure: TRANSURETHRAL RESECTION OF BLADDER TUMOR (TURBT);  Surgeon: MLowella Bandy MD;  [facility]  Service: Urology;  Laterality: N/A;   TRANSURETHRAL RESECTION OF BLADDER TUMOR N/A 03/21/2017   Procedure: POSSIBLE TRANSURETHRAL RESECTION OF BLADDER TUMOR (TURBT);  Surgeon: BNickie Retort MD;  [facility]  Service: Urology;  Laterality: N/A;    Home Medications:  Allergies as of 09/17/2022       Reactions   Ciprofloxacin Hives   Zofran [ondansetron] Rash        Medication List        Accurate as of September 17, 2022 10:38 AM. If you have any questions, ask your nurse or doctor.          acetaminophen 500 MG tablet Commonly known as: TYLENOL Take 1,000 mg by mouth every 6 (six) hours as needed for mild pain, fever, moderate pain or headache.   apixaban 5 MG Tabs tablet Commonly known as: ELIQUIS Take 1 tablet (5 mg total) by mouth 2 (two) times daily.  azithromycin 250 MG tablet Commonly known as: Zithromax Z-Pak Take 2 tablets day 1 and then 1 daily for 4 days   cetirizine 10 MG tablet Commonly known as: ZYRTEC Take 1 tablet (10 mg total) by mouth 2 (two) times daily.   clotrimazole-betamethasone cream Commonly known as: LOTRISONE Apply 1 application  topically in the morning and at bedtime.   HYDROmorphone 4 MG tablet Commonly known as: Dilaudid Take 1 tablet (4 mg total) by mouth every 4 (four) hours as needed for severe pain.   lactose free nutrition Liqd Take 237 mLs by mouth 3 (three) times daily between meals.   methenamine 1 g tablet Commonly  known as: Hiprex Take 1 tablet (1 g total) by mouth 2 (two) times daily with a meal.   metoprolol tartrate 25 MG tablet Commonly known as: LOPRESSOR Take 1 tablet (25 mg total) by mouth 2 (two) times daily.   oxyCODONE 5 MG immediate release tablet Commonly known as: Oxy IR/ROXICODONE Take 1 tablet (5 mg total) by mouth every 4 (four) hours as needed for severe pain.   pantoprazole 40 MG tablet Commonly known as: PROTONIX Take 1 tablet (40 mg total) by mouth daily.   polyethylene glycol 17 g packet Commonly known as: MIRALAX / GLYCOLAX Take 17 g by mouth daily as needed for mild constipation.   predniSONE 10 MG tablet Commonly known as: DELTASONE Take two tablets ('20mg'$ ) twice daily for three days, then one tablet ('10mg'$ ) twice daily for three days, then STOP.   triamcinolone ointment 0.1 % Commonly known as: KENALOG Apply 1 Application topically 2 (two) times daily. Do not put onto the face.        Allergies:  Allergies  Allergen Reactions   Ciprofloxacin Hives   Zofran [Ondansetron] Rash    Family History: Family History  Problem Relation Age of Onset   Alcoholism Father    Colon cancer Neg Hx    Colon polyps Neg Hx    Diabetes Neg Hx    Kidney disease Neg Hx    Esophageal cancer Neg Hx    Heart disease Neg Hx    Gallbladder disease Neg Hx    Allergic rhinitis Neg Hx    Angioedema Neg Hx    Asthma Neg Hx    Atopy Neg Hx    Eczema Neg Hx    Immunodeficiency Neg Hx    Urticaria Neg Hx     Social History:  reports that he quit smoking about 33 years ago. His smoking use included cigarettes. He has a 34.00 pack-year smoking history. He has never used smokeless tobacco. He reports current alcohol use of about 6.0 standard drinks of alcohol per week. He reports that he does not use drugs.  ROS: All other review of systems were reviewed and are negative except what is noted above in HPI  Physical Exam: BP 116/73   Pulse 75   Constitutional:  Alert and  oriented, No acute distress. HEENT: [place] AT, moist mucus membranes.  Trachea midline, no masses. Cardiovascular: No clubbing, cyanosis, or edema. Respiratory: Normal respiratory effort, no increased work of breathing. GI: Abdomen is soft, nontender, nondistended, no abdominal masses GU: No CVA tenderness.  Lymph: No cervical or inguinal lymphadenopathy. Skin: No rashes, bruises or suspicious lesions. Neurologic: Grossly intact, no focal deficits, moving all 4 extremities. Psychiatric: Normal mood and affect.  Laboratory Data: Lab Results  Component Value Date   WBC 7.6 07/12/2022   HGB 15.4 07/12/2022   HCT 46.4 07/12/2022  MCV 105.5 (H) 07/12/2022   PLT 291 07/12/2022    Lab Results  Component Value Date   CREATININE 1.17 07/12/2022    No results found for: "PSA"  No results found for: "TESTOSTERONE"  Lab Results  Component Value Date   HGBA1C 5.1 06/16/2020    Urinalysis    Component Value Date/Time   COLORURINE RED (A) 05/05/2022 2048   APPEARANCEUR HAZY (A) 05/05/2022 2048   APPEARANCEUR Hazy (A) 04/23/2022 1107   LABSPEC 1.009 05/05/2022 2048   PHURINE 6.0 05/05/2022 2048   GLUCOSEU NEGATIVE 05/05/2022 2048   HGBUR LARGE (A) 05/05/2022 2048   BILIRUBINUR NEGATIVE 05/05/2022 2048   BILIRUBINUR Negative 04/23/2022 1107   KETONESUR NEGATIVE 05/05/2022 2048   PROTEINUR 100 (A) 05/05/2022 2048   NITRITE NEGATIVE 05/05/2022 2048   LEUKOCYTESUR NEGATIVE 05/05/2022 2048    Lab Results  Component Value Date   LABMICR See below: 04/23/2022   WBCUA >30 (A) 04/23/2022   LABEPIT 0-10 04/23/2022   MUCUS Present 04/23/2022   BACTERIA RARE (A) 05/05/2022    Pertinent Imaging:  No results found for this or any previous visit.  Results for orders placed during the hospital encounter of 01/25/18  US Venous Img Lower Bilateral  Narrative CLINICAL DATA:  Bilateral pain and edema  EXAM: BILATERAL LOWER EXTREMITY VENOUS DOPPLER  ULTRASOUND  TECHNIQUE: Gray-scale sonography with graded compression, as well as color Doppler and duplex ultrasound were performed to evaluate the lower extremity deep venous systems from the level of the common femoral vein and including the common femoral, femoral, profunda femoral, popliteal and calf veins including the posterior tibial, peroneal and gastrocnemius veins when visible. The superficial great saphenous vein was also interrogated. Spectral Doppler was utilized to evaluate flow at rest and with distal augmentation maneuvers in the common femoral, femoral and popliteal veins.  COMPARISON:  None.  FINDINGS: RIGHT LOWER EXTREMITY  Common Femoral Vein: No evidence of thrombus. Normal compressibility, respiratory phasicity and response to augmentation.  Saphenofemoral Junction: No evidence of thrombus. Normal compressibility and flow on color Doppler imaging.  Profunda Femoral Vein: No evidence of thrombus. Normal compressibility and flow on color Doppler imaging.  Femoral Vein: No evidence of thrombus. Normal compressibility, respiratory phasicity and response to augmentation.  Popliteal Vein: No evidence of thrombus. Normal compressibility, respiratory phasicity and response to augmentation.  Calf Veins: No evidence of thrombus. Normal compressibility and flow on color Doppler imaging.  Superficial Great Saphenous Vein: No evidence of thrombus. Normal compressibility.  Venous Reflux:  None.  Other Findings:  None.  LEFT LOWER EXTREMITY  Common Femoral Vein: No evidence of thrombus. Normal compressibility, respiratory phasicity and response to augmentation.  Saphenofemoral Junction: No evidence of thrombus. Normal compressibility and flow on color Doppler imaging.  Profunda Femoral Vein: No evidence of thrombus. Normal compressibility and flow on color Doppler imaging.  Femoral Vein: No evidence of thrombus. Normal compressibility, respiratory  phasicity and response to augmentation.  Popliteal Vein: No evidence of thrombus. Normal compressibility, respiratory phasicity and response to augmentation.  Calf Veins: No evidence of thrombus. Normal compressibility and flow on color Doppler imaging.  Superficial Great Saphenous Vein: No evidence of thrombus. Normal compressibility.  Venous Reflux:  None.  Other Findings:  None.  IMPRESSION: No evidence of deep venous thrombosis.   Electronically Signed By: Jerilynn Mages.  Shick M.D. On: 01/27/2018 11:02  No results found for this or any previous visit.  No results found for this or any previous visit.  Results for orders placed during  the hospital encounter of 01/25/18  US RENAL  Narrative CLINICAL DATA:  UTI, fever  EXAM: RENAL / URINARY TRACT ULTRASOUND COMPLETE  COMPARISON:  CT 11/25/2017  FINDINGS: Right Kidney:  Length: 11.3 cm. Normal echotexture. No hydronephrosis. 2.3 cm cyst in the midpole.  Left Kidney:  Length: 11.3 cm. Echogenicity within normal limits. No mass or hydronephrosis visualized.  Bladder:  Prior cystectomy.  IMPRESSION: No hydronephrosis or acute findings.   Electronically Signed By: Rolm Baptise M.D. On: 01/27/2018 10:56  No valid procedures specified. No results found for this or any previous visit.  Results for orders placed during the hospital encounter of 05/05/22  CT Renal Stone Study  Narrative CLINICAL DATA:  Flank pain, kidney stone suspected. Pt with blood in urine started this afternoon. Recent with UTI  EXAM: CT ABDOMEN AND PELVIS WITHOUT CONTRAST  TECHNIQUE: Multidetector CT imaging of the abdomen and pelvis was performed following the standard protocol without IV contrast.  RADIATION DOSE REDUCTION: This exam was performed according to the departmental dose-optimization program which includes automated exposure control, adjustment of the mA and/or kV according to patient size and/or use of iterative  reconstruction technique.  COMPARISON:  CT abdomen pelvis 04/11/2022  FINDINGS: Lower chest: Trace to small right and trace left pleural effusions. Small hiatal hernia.  Hepatobiliary: Several fluid density lesions within the right hepatic lobe likely represent simple renal cysts. No pulmonary nodule. No pulmonary mass. No pleural effusion. No pneumothorax.  Pancreas: No focal lesion. Normal pancreatic contour. No surrounding inflammatory changes. No main pancreatic ductal dilatation.  Spleen: Normal in size without focal abnormality.  Adrenals/Urinary Tract:  No adrenal nodule bilaterally.  Persistent mild left hydronephrosis and severe left hydroureter with associated urothelial thickening. No nephroureterolithiasis. No hydronephrosis on the right.  Status post radical cystoprostatectomy with lower quadrant ileal conduit formation.  Stomach/Bowel: Stomach is within normal limits. No evidence of bowel wall thickening or dilatation. Colonic diverticulosis. The appendix is not definitely identified with no inflammatory changes in the right lower quadrant to suggest acute appendicitis.  Vascular/Lymphatic: No abdominal aorta or iliac aneurysm. At least moderate atherosclerotic plaque of the aorta and its branches. No abdominal, pelvic, or inguinal lymphadenopathy.  Reproductive: Prostatectomy.  Other: No intraperitoneal free fluid. No intraperitoneal free gas. No organized fluid collection.  Musculoskeletal:  No abdominal wall hernia or abnormality.  Similar-appearing expansile lytic lesion of the right inferior pubic rami. No acute displaced fracture.  IMPRESSION: 1. Persistent mild left hydronephrosis and severe left hydroureter with associated urothelial thickening in a patient status post radical cystoprostatectomy with lower quadrant ileal conduit formation. This may reflect chronic changes of obstructive uropathy or reflux. Limited evaluation on this  noncontrast study. Recommend correlation with urinalysis for infection. 2. Trace to small right and trace left pleural effusions. 3. Colonic diverticulosis with no acute diverticulitis. 4. Similar-appearing expansile lytic lesion of the right inferior pubic rami. Finding likely represents an osseous metastasis. 5.  Aortic Atherosclerosis (ICD10-I70.0).   Electronically Signed By: Iven Finn M.D. On: 05/05/2022 23:00   Assessment & Plan:    1. Recurrent UTI -continue hiprex 1g BID - Urinalysis, Routine w reflex microscopic  2. Hydronephrosis, unspecified hydronephrosis type -followup 6 months with renal US   No follow-ups on file.  Nicolette Bang, MD  [facility] [place]

## 2022-09-17 NOTE — Therapy (Addendum)
OUTPATIENT PHYSICAL THERAPY TREATMENT     Patient Name: [name] MRN: [id number] DOB:22-Apr-1941, 82 y.o., male Today's Date: 09/17/2022  END OF SESSION:  PT End of Session - 09/17/22 1123     Visit Number 3    Number of Visits 8    Date for PT Re-Evaluation 10/14/22    Authorization Type Medicare Part A; [employer] MCR    Progress Note Due on Visit 10    PT Start Time 1120    PT Stop Time 1200    PT Time Calculation (min) 40 min    Activity Tolerance Patient limited by fatigue              Past Medical History:  Diagnosis Date   Bladder cancer ([place])    Bone cancer ([place])    BPH (benign prostatic hypertrophy)    Dysuria    GERD (gastroesophageal reflux disease)    History of adenomatous polyp of colon    tubular adenoma 06/ 2018   History of bladder cancer 12/2014   urologist-  dr [name]--  dx High Grade TCC without stromal invasion s/p TURBT and intravesical BCG tx/  recurrent 07/2015 (pTa) TCC  repear intravesical BCG tx    History of hiatal hernia    History of urethral stricture    Nocturia    Past Surgical History:  Procedure Laterality Date   CARDIAC CATHETERIZATION  1997   normal coronary arteries   CARDIOVASCULAR STRESS TEST  12-14-2005   Low risk perfusion study/  very small scar in the inferior septum from mid ventricle to apex,  no ischemia/  mild inferior septal hypokinesis/  ef 58%   CATARACT EXTRACTION W/ INTRAOCULAR LENS  IMPLANT, BILATERAL  2011   COLONOSCOPY  last one 06/ 2018   CYSTOSCOPY WITH BIOPSY N/A 08/12/2015   Procedure: CYSTOSCOPY WITH BIOPSY AND FULGERATION;  Surgeon: [name], MD;  [facility];  Service: Urology;  Laterality: N/A;   CYSTOSCOPY WITH INJECTION N/A 06/29/2017   Procedure: CYSTOSCOPY WITH INJECTION OF INDOCYANINE GREEN DYE;  Surgeon: Alexis Frock, MD;  Location: [facility];  Service: Urology;  Laterality: N/A;   CYSTOSCOPY WITH URETHRAL DILATATION N/A 03/21/2017   Procedure:  CYSTOSCOPY;  Surgeon: [name], MD;  Location: [facility];  Service: Urology;  Laterality: N/A;   ESOPHAGOGASTRODUODENOSCOPY  12-05-2014   IR IMAGING GUIDED PORT INSERTION  02/07/2018   IR NEPHROSTOMY PLACEMENT LEFT  12/25/2021   IR REMOVAL TUN ACCESS W/ PORT W/O FL MOD SED  04/14/2018   TRANSTHORACIC ECHOCARDIOGRAM  01-31-2008   nomral LVF,  ef 55-65%/  mild pulmonic stenosis without regurg.,  peak transpulomonic valve gradient 59mHg   TRANSURETHRAL RESECTION OF BLADDER TUMOR N/A 12/31/2014   Procedure: TRANSURETHRAL RESECTION OF BLADDER TUMOR (TURBT);  Surgeon: MLowella Bandy MD;  [facility]  Service: Urology;  Laterality: N/A;   TRANSURETHRAL RESECTION OF BLADDER TUMOR N/A 03/21/2017   Procedure: POSSIBLE TRANSURETHRAL RESECTION OF BLADDER TUMOR (TURBT);  Surgeon: BNickie Retort MD;  [facility]  Service: Urology;  Laterality: N/A;   Patient Active Problem List   Diagnosis Date Noted   Generalized weakness 06/21/2022   Pleural effusion 06/21/2022   Paroxysmal atrial fibrillation ([place] 04/14/2022   C. 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 ([place] 10/30/2021   Sepsis secondary to urinary source 07/13/2020   Pseudomonas aeruginosa infection 06/18/2020   GERD (  gastroesophageal reflux disease)    Sinus tachycardia    Fever    Dehydration    Rigors    Severe sepsis ([facility])    Elevated lactic acid level    Cancer with pulmonary metastases ([facility]) 03/31/2020   Adverse drug reaction 06/05/2018   Bacteremia associated with intravascular line ([place]) 04/20/2018   Febrile neutropenia ([place]) 04/13/2018   Gram positive sepsis ([place]) 04/13/2018   SIRS (systemic inflammatory response syndrome) ([place]) 04/12/2018   Bacteremia due to Gram-positive bacteria 04/12/2018   Pancytopenia ([place]) 04/12/2018   Status post chemotherapy    UTI (urinary tract infection)  04/11/2018   Port-A-Cath in place 02/20/2018   Encounter for antineoplastic chemotherapy 02/17/2018   Sepsis due to urinary tract infection ([place]) 01/25/2018   Sepsis secondary to UTI ([place]) 01/25/2018   Hyperglycemia    Goals of care, counseling/discussion 01/18/2018   Bone metastasis 12/29/2017   Status post ileal conduit due to bladder cancer 06/29/2017   Bladder cancer ([place]) 06/28/2017   Stage IV malignant neoplasm of urinary bladder /with mets to the bones and lungs 04/20/2017   COPD (chronic obstructive pulmonary disease) ([place]) 03/07/2008    PCP: Dr. [name], MD  REFERRING PROVIDER: Dr. Orson Eva, MD  REFERRING DIAG:  Diagnosis  R53.1 (ICD-10-CM) - Generalized weakness  J90 (ICD-10-CM) - Pleural effusion    THERAPY DIAG:  Muscle weakness (generalized)  Difficulty in walking, not elsewhere classified  Rationale for Evaluation and Treatment: Rehabilitation  ONSET DATE: 3-4 months ago  SUBJECTIVE:   SUBJECTIVE STATEMENT: Patient states that his legs are still weak. However, patient reports that he's getting better with PT. Denies recent falls.  Evaluation:Legs are weak; been going on since hospitalization 3-4 months ago for an infection; laid around a lot afterwards so now am getting out breath and my legs get tired quickly.  My lungs were checked yesterday and look good.  Will have another PET scan in June; not currently taking any cancer treatments.  Urostomy present; bladder cancer 5 years ago.  PERTINENT HISTORY: Urostomy PAIN:  Are you having pain? No  PRECAUTIONS: None  WEIGHT BEARING RESTRICTIONS: No  FALLS:  Has patient fallen in last 6 months? No  LIVING ENVIRONMENT: Lives with: lives with their spouse Lives in: House/apartment Stairs: Yes: Internal: 8 steps; on right going up Has following equipment at home: Single point cane, Walker - 2 wheeled, and chair lift  OCCUPATION: retired  PLOF: Independent  PATIENT GOALS: get my legs  stronger  NEXT MD VISIT: June; cancer doctor  OBJECTIVE:   DIAGNOSTIC FINDINGS: none of legs  PATIENT SURVEYS:  LEFS 51.2%  COGNITION: Overall cognitive status: Within functional limits for tasks assessed     SENSATION: WFL  EDEMA:  None reported   LOWER EXTREMITY MMT:  MMT Right eval Left eval  Hip flexion 4+ 4+  Hip extension    Hip abduction    Hip adduction    Hip internal rotation    Hip external rotation    Knee flexion    Knee extension 4+ 4+  Ankle dorsiflexion 4 4  Ankle plantarflexion    Ankle inversion    Ankle eversion     (Blank rows = not tested)   FUNCTIONAL TESTS:  09/15/2022 5 times sit to stand: 20 sec 6/10 fatigue (was 16.8 sec 8/10 fatigue at evaluation) 2 minute walk test:  339 feet 6/10 fatigue(was 336 ft 6/10 fatigue at evaluation)  Evaluation:  5 times sit to stand: 16.8 sec 8/10 fatigue 2  minute walk test: 336 ft 6/10 fatigue  GAIT: Distance walked: 336 ft at evaluation Assistive device utilized: None Level of assistance: Modified independence Comments: slower than normal gait speed   TODAY'S TREATMENT:                                                                                                                              DATE:  09/17/22 Sit to stands 10X from standard chair no UE Sitting:  LAQ x 3" x 10 x 2 Standing: with bil UE assist heelraise 20X  Toeraise 20X  Hip abduction 10X2 Hip extension 10X2 Marching 10X2 Mini squats x 10 x 2 x 3" Forward Step ups ,4" box, 10 x 2 on each LE (no UE support) Nustep seat 10, arms 8, level 2, 5 minutes  1/49/70 Re-cert; functional test measures Goal review 5 times sit to stand: 20 sec 6/10 fatigue (was 16.8 sec 8/10 fatigue at evaluation) 2 minute walk test:  339 feet 6/10 fatigue(was 336 ft 6/10 fatigue at evaluation) Sit to stands 10X from standard chair no UE Sitting:  HEP review LAQ, march, heel/toe raise Standing: with bil UE assist heelraise 20X  Toeraise 20X  Hip  abduction 10X Hip extension 10X Marching 10X Nustep seat 10, arms 8, level 1 5 minutes (60-70 SPM)  08/18/2022  physical therapy evaluation and HEP instruction    PATIENT EDUCATION:  Educated Updated HEP Person educated: Patient Education method: Consulting civil engineer, Demonstration Education comprehension: verbalized understanding, returned demonstration, verbal cues required, and tactile cues required   HOME EXERCISE PROGRAM: Access Code: [email or web address] URL: [email or web address] Date: 08/18/2022 Prepared by: AP - Rehab Exercises - Sit to Stand  - 2 x daily - 7 x weekly - 1 sets - 5 reps - Seated Long Arc Quad  - 2 x daily - 7 x weekly - 2 sets - 10 reps - Seated March  - 2 x daily - 7 x weekly - 2 sets - 10 reps - Seated Heel Toe Raises  - 2 x daily - 7 x weekly - 2 sets - 10 reps  09/17/22 - Mini Squat with Counter Support  - 1-2 x daily - 5-7 x weekly - 2 sets - 10 reps - 3 hold  Date: 09/15/2022 Prepared by: Roseanne Reno Exercises - Sit to Stand  - 2 x daily - 7 x weekly - 1 sets - 5 reps - Seated Long Arc Quad  - 2 x daily - 7 x weekly - 2 sets - 10 reps - Standing Marching  - 2 x daily - 7 x weekly - 2 sets - 10 reps - Standing Heel Raise with Support  - 2 x daily - 7 x weekly - 2 sets - 10 reps - Standing Hip Abduction with Counter Support  - 2 x daily - 7 x weekly - 2 sets - 10 reps - Standing Hip Extension with Counter Support  - 2 x daily - 7 x weekly -  2 sets - 10 reps   ASSESSMENT:  CLINICAL IMPRESSION: Tolerated all activities with mild to moderate levels of fatigue. Frequent rest periods provided. Required mild to moderate amount of verbal and visual cueing to ensure correct execution of activity especially on the mini squats and step ups. Slight difficulty noted during step ups due to LE weakness. To date, skilled PT is required to address the impairments and improve function.  Pt returns following evaluation 4 weeks prior to continue therapy for LE weakness.   Pt with slower sit to stand time as compared to evaluation but slightly further distance on 2 minute walk test.  Reviewed goals with progression but none met at this point.  Progressed with standing strengthening exercises.  Pt does fatigue quickly requiring short rest breaks between exercises. Updated HEP to include added exercises this session.  Began nustep to help work on activity tolerance in addition to ambulation instructed for home.  Patient will continue to benefit from skilled physical therapy services to address deficits and improve level of function with ADLs and functional mobility tasks.   OBJECTIVE IMPAIRMENTS: cardiopulmonary status limiting activity, decreased activity tolerance, decreased endurance, decreased mobility, difficulty walking, decreased strength, and impaired perceived functional ability.   ACTIVITY LIMITATIONS: carrying, lifting, standing, squatting, stairs, locomotion level, and caring for others  PARTICIPATION LIMITATIONS: cleaning, shopping, community activity, and yard work  [employer] POTENTIAL: Good  CLINICAL DECISION MAKING: Stable/uncomplicated  EVALUATION COMPLEXITY: Low   GOALS: Goals reviewed with patient? Yes  SHORT TERM GOALS: Target date: 09/01/2022 patient will be independent with initial HEP  Baseline: Goal status: IN PROGRESS  2.  Patient will increase the distance on his 2 MWT to 360 ft to demonstrate improved functional gait and mobility Baseline: 336 ft Goal status: IN PROGRESS  LONG TERM GOALS: Target date: 09/15/2022  Patient will be independent in self management strategies to improve quality of life and functional outcomes.  Baseline:  Goal status: IN PROGRESS  2.  Patient will increase the distance on his 2 MWT to 400 ft ft to demonstrate improved functional gait and mobility Baseline: 336 ft Goal status: IN PROGRESS  3.  Patient will improve LEFS score by 5 points to demonstrate improved functional mobility   Baseline:  41/80 Goal status: IN PROGRESS  4.  Patient will increase  leg MMTs to 5/5 without pain to promote return to ambulation community distances with minimal deviation.  Baseline: see above Goal status: IN PROGRESS  PLAN:  PT FREQUENCY: 2x/week  PT DURATION: 4 weeks  PLANNED INTERVENTIONS: Therapeutic exercises, Therapeutic activity, Neuromuscular re-education, Balance training, Gait training, Patient/Family education, Joint manipulation, Joint mobilization, Stair training, Orthotic/Fit training, DME instructions, Aquatic Therapy, Dry Needling, Electrical stimulation, Spinal manipulation, Spinal mobilization, Cryotherapy, Moist heat, Compression bandaging, scar mobilization, Splintting, Taping, Traction, Ultrasound, Ionotophoresis '4mg'$ /ml Dexamethasone, and Manual therapy   PLAN FOR NEXT SESSION: Continue POC and may progress as tolerated with emphasis on lower extremity strengthening, walking program, and cardiopulmonary endurance. Provide written HEP on next session with the updated exercises.   12:29 PM, 09/17/22 Harvie Heck. Adan Baehr, PT, DPT, OCS Board-Certified Clinical Specialist in [place] # ([place]): [id number] [phone]

## 2022-09-17 NOTE — Patient Instructions (Signed)
Urinary Tract Infection, Adult  A urinary tract infection (UTI) is an infection of any part of the urinary tract. The urinary tract includes the kidneys, ureters, bladder, and urethra. These organs make, store, and get rid of urine in the body. An upper UTI affects the ureters and kidneys. A lower UTI affects the bladder and urethra. What are the causes? Most urinary tract infections are caused by bacteria in your genital area around your urethra, where urine leaves your body. These bacteria grow and cause inflammation of your urinary tract. What increases the risk? You are more likely to develop this condition if: You have a urinary catheter that stays in place. You are not able to control when you urinate or have a bowel movement (incontinence). You are male and you: Use a spermicide or diaphragm for birth control. Have low estrogen levels. Are pregnant. You have certain genes that increase your risk. You are sexually active. You take antibiotic medicines. You have a condition that causes your flow of urine to slow down, such as: An enlarged prostate, if you are male. Blockage in your urethra. A kidney stone. A nerve condition that affects your bladder control (neurogenic bladder). Not getting enough to drink, or not urinating often. You have certain medical conditions, such as: Diabetes. A weak disease-fighting system (immunesystem). Sickle cell disease. Gout. Spinal cord injury. What are the signs or symptoms? Symptoms of this condition include: Needing to urinate right away (urgency). Frequent urination. This may include small amounts of urine each time you urinate. Pain or burning with urination. Blood in the urine. Urine that smells bad or unusual. Trouble urinating. Cloudy urine. Vaginal discharge, if you are male. Pain in the abdomen or the lower back. You may also have: Vomiting or a decreased appetite. Confusion. Irritability or tiredness. A fever or  chills. Diarrhea. The first symptom in older adults may be confusion. In some cases, they may not have any symptoms until the infection has worsened. How is this diagnosed? This condition is diagnosed based on your medical history and a physical exam. You may also have other tests, including: Urine tests. Blood tests. Tests for STIs (sexually transmitted infections). If you have had more than one UTI, a cystoscopy or imaging studies may be done to determine the cause of the infections. How is this treated? Treatment for this condition includes: Antibiotic medicine. Over-the-counter medicines to treat discomfort. Drinking enough water to stay hydrated. If you have frequent infections or have other conditions such as a kidney stone, you may need to see a health care provider who specializes in the urinary tract (urologist). In rare cases, urinary tract infections can cause sepsis. Sepsis is a life-threatening condition that occurs when the body responds to an infection. Sepsis is treated in the hospital with IV antibiotics, fluids, and other medicines. Follow these instructions at home:  Medicines Take over-the-counter and prescription medicines only as told by your health care provider. If you were prescribed an antibiotic medicine, take it as told by your health care provider. Do not stop using the antibiotic even if you start to feel better. General instructions Make sure you: Empty your bladder often and completely. Do not hold urine for long periods of time. Empty your bladder after sex. Wipe from front to back after urinating or having a bowel movement if you are male. Use each tissue only one time when you wipe. Drink enough fluid to keep your urine pale yellow. Keep all follow-up visits. This is important. Contact a health   care provider if: Your symptoms do not get better after 1-2 days. Your symptoms go away and then return. Get help right away if: You have severe pain in  your back or your lower abdomen. You have a fever or chills. You have nausea or vomiting. Summary A urinary tract infection (UTI) is an infection of any part of the urinary tract, which includes the kidneys, ureters, bladder, and urethra. Most urinary tract infections are caused by bacteria in your genital area. Treatment for this condition often includes antibiotic medicines. If you were prescribed an antibiotic medicine, take it as told by your health care provider. Do not stop using the antibiotic even if you start to feel better. Keep all follow-up visits. This is important. This information is not intended to replace advice given to you by your health care provider. Make sure you discuss any questions you have with your health care provider. Document Revised: 03/14/2020 Document Reviewed: 03/14/2020 Elsevier Patient Education  2023 Elsevier Inc.  

## 2022-09-20 ENCOUNTER — Ambulatory Visit (HOSPITAL_COMMUNITY): Payer: Medicare Other | Admitting: Physical Therapy

## 2022-09-20 DIAGNOSIS — M6281 Muscle weakness (generalized): Secondary | ICD-10-CM | POA: Diagnosis not present

## 2022-09-20 DIAGNOSIS — R262 Difficulty in walking, not elsewhere classified: Secondary | ICD-10-CM

## 2022-09-20 NOTE — Therapy (Signed)
OUTPATIENT PHYSICAL THERAPY TREATMENT     Patient Name: RUHAAN NORDAHL MRN: [id number] DOB:[birth year], 82 y.o., male Today's Date: 09/20/2022  END OF SESSION:  PT End of Session - 09/20/22 1119     Visit Number 4    Number of Visits 8    Date for PT Re-Evaluation 10/14/22    Authorization Type Medicare Part A; [employer] MCR    Progress Note Due on Visit 10    PT Start Time 1115    PT Stop Time 1155    PT Time Calculation (min) 40 min    Activity Tolerance Patient limited by fatigue              Past Medical History:  Diagnosis Date   Bladder cancer ([place])    Bone cancer ([place])    BPH (benign prostatic hypertrophy)    Dysuria    GERD (gastroesophageal reflux disease)    History of adenomatous polyp of colon    tubular adenoma 06/ 2018   History of bladder cancer 12/2014   urologist-  dr [name]--  dx High Grade TCC without stromal invasion s/p TURBT and intravesical BCG tx/  recurrent 07/2015 (pTa) TCC  repear intravesical BCG tx    History of hiatal hernia    History of urethral stricture    Nocturia    Past Surgical History:  Procedure Laterality Date   CARDIAC CATHETERIZATION  1997   normal coronary arteries   CARDIOVASCULAR STRESS TEST  12-14-2005   Low risk perfusion study/  very small scar in the inferior septum from mid ventricle to apex,  no ischemia/  mild inferior septal hypokinesis/  ef 58%   CATARACT EXTRACTION W/ INTRAOCULAR LENS  IMPLANT, BILATERAL  2011   COLONOSCOPY  last one 06/ 2018   CYSTOSCOPY WITH BIOPSY N/A 08/12/2015   Procedure: CYSTOSCOPY WITH BIOPSY AND FULGERATION;  Surgeon: [name], MD;  Location: [facility];  Service: Urology;  Laterality: N/A;   CYSTOSCOPY WITH INJECTION N/A 06/29/2017   Procedure: CYSTOSCOPY WITH INJECTION OF INDOCYANINE GREEN DYE;  Surgeon: Alexis Frock, MD;  Location: [facility];  Service: Urology;  Laterality: N/A;   CYSTOSCOPY WITH URETHRAL DILATATION N/A 03/21/2017   Procedure:  CYSTOSCOPY;  Surgeon: [name], MD;  [facility];  Service: Urology;  Laterality: N/A;   ESOPHAGOGASTRODUODENOSCOPY  12-05-2014   IR IMAGING GUIDED PORT INSERTION  02/07/2018   IR NEPHROSTOMY PLACEMENT LEFT  12/25/2021   IR REMOVAL TUN ACCESS W/ PORT W/O FL MOD SED  04/14/2018   TRANSTHORACIC ECHOCARDIOGRAM  01-31-2008   nomral LVF,  ef 55-65%/  mild pulmonic stenosis without regurg.,  peak transpulomonic valve gradient 81mHg   TRANSURETHRAL RESECTION OF BLADDER TUMOR N/A 12/31/2014   Procedure: TRANSURETHRAL RESECTION OF BLADDER TUMOR (TURBT);  Surgeon: MLowella Bandy MD;  [facility]  Service: Urology;  Laterality: N/A;   TRANSURETHRAL RESECTION OF BLADDER TUMOR N/A 03/21/2017   Procedure: POSSIBLE TRANSURETHRAL RESECTION OF BLADDER TUMOR (TURBT);  Surgeon: BNickie Retort MD;  [facility]  Service: Urology;  Laterality: N/A;   Patient Active Problem List   Diagnosis Date Noted   Generalized weakness 06/21/2022   Pleural effusion 06/21/2022   Paroxysmal atrial fibrillation ([place] 04/14/2022   C. 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 ([facility] [place] 10/30/2021   Sepsis secondary to urinary source 07/13/2020   Pseudomonas aeruginosa infection 06/18/2020   GERD (  gastroesophageal reflux disease)    Sinus tachycardia    Fever    Dehydration    Rigors    Severe sepsis ([facility])    Elevated lactic acid level    Cancer with pulmonary metastases ([facility]) 03/31/2020   Adverse drug reaction 06/05/2018   Bacteremia associated with intravascular line ([place]) 04/20/2018   Febrile neutropenia ([place]) 04/13/2018   Gram positive sepsis ([place]) 04/13/2018   SIRS (systemic inflammatory response syndrome) ([place]) 04/12/2018   Bacteremia due to Gram-positive bacteria 04/12/2018   Pancytopenia ([place]) 04/12/2018   Status post chemotherapy    UTI (urinary tract infection)  04/11/2018   Port-A-Cath in place 02/20/2018   Encounter for antineoplastic chemotherapy 02/17/2018   Sepsis due to urinary tract infection ([place]) 01/25/2018   Sepsis secondary to UTI ([place]) 01/25/2018   Hyperglycemia    Goals of care, counseling/discussion 01/18/2018   Bone metastasis 12/29/2017   Status post ileal conduit due to bladder cancer 06/29/2017   Bladder cancer ([place]) 06/28/2017   Stage IV malignant neoplasm of urinary bladder /with mets to the bones and lungs 04/20/2017   COPD (chronic obstructive pulmonary disease) ([place]) 03/07/2008    PCP: Dr. [name], MD  REFERRING PROVIDER: Dr. Orson Eva, MD  REFERRING DIAG:  Diagnosis  R53.1 (ICD-10-CM) - Generalized weakness  J90 (ICD-10-CM) - Pleural effusion    THERAPY DIAG:  Muscle weakness (generalized)  Difficulty in walking, not elsewhere classified  Rationale for Evaluation and Treatment: Rehabilitation  ONSET DATE: 3-4 months ago  SUBJECTIVE:   SUBJECTIVE STATEMENT: Patient states he is doing his exercises.  No pain or isssues today.  Evaluation:Legs are weak; been going on since hospitalization 3-4 months ago for an infection; laid around a lot afterwards so now am getting out breath and my legs get tired quickly.  My lungs were checked yesterday and look good.  Will have another PET scan in June; not currently taking any cancer treatments.  Urostomy present; bladder cancer 5 years ago.  PERTINENT HISTORY: Urostomy PAIN:  Are you having pain? No  PRECAUTIONS: None  WEIGHT BEARING RESTRICTIONS: No  FALLS:  Has patient fallen in last 6 months? No  LIVING ENVIRONMENT: Lives with: lives with their spouse Lives in: House/apartment Stairs: Yes: Internal: 8 steps; on right going up Has following equipment at home: Single point cane, Walker - 2 wheeled, and chair lift  OCCUPATION: retired  PLOF: Independent  PATIENT GOALS: get my legs stronger  NEXT MD VISIT: June; cancer doctor  OBJECTIVE:    DIAGNOSTIC FINDINGS: none of legs  PATIENT SURVEYS:  LEFS 51.2%  COGNITION: Overall cognitive status: Within functional limits for tasks assessed     SENSATION: WFL  EDEMA:  None reported   LOWER EXTREMITY MMT:  MMT Right eval Left eval  Hip flexion 4+ 4+  Hip extension    Hip abduction    Hip adduction    Hip internal rotation    Hip external rotation    Knee flexion    Knee extension 4+ 4+  Ankle dorsiflexion 4 4  Ankle plantarflexion    Ankle inversion    Ankle eversion     (Blank rows = not tested)   FUNCTIONAL TESTS:  09/15/2022 5 times sit to stand: 20 sec 6/10 fatigue (was 16.8 sec 8/10 fatigue at evaluation) 2 minute walk test:  339 feet 6/10 fatigue(was 336 ft 6/10 fatigue at evaluation)  Evaluation:  5 times sit to stand: 16.8 sec 8/10 fatigue 2 minute walk test: 336 ft 6/10 fatigue  GAIT: Distance walked: 336 ft at evaluation Assistive device utilized: None Level of assistance: Modified independence Comments: slower than normal gait speed   TODAY'S TREATMENT:                                                                                                                              DATE:  Standing: with bil UE assist heelraise 20X  Toeraise 20X  Hip abduction 20X Hip extension 20X Marching 20X Mini squats 20X Forward Step ups 6" 10 x 2 on each LE 1 UE assist Lateral step ups 4" 10X2 on each LE 1 UE assist Vectors with 1 UE assist 10X3" each side Nustep seat 10, arms 8, level 2, 5 minutes  09/17/22 Sit to stands 10X from standard chair no UE Sitting:  LAQ x 3" x 10 x 2 Standing: with bil UE assist heelraise 20X  Toeraise 20X  Hip abduction 10X2 Hip extension 10X2 Marching 10X2 Mini squats x 10 x 2 x 3" Forward Step ups ,4" box, 10 x 2 on each LE (no UE support) Nustep seat 10, arms 8, level 2, 5 minutes  9/44/96 Re-cert; functional test measures Goal review 5 times sit to stand: 20 sec 6/10 fatigue (was 16.8 sec 8/10 fatigue  at evaluation) 2 minute walk test:  339 feet 6/10 fatigue(was 336 ft 6/10 fatigue at evaluation) Sit to stands 10X from standard chair no UE Sitting:  HEP review LAQ, march, heel/toe raise Standing: with bil UE assist heelraise 20X  Toeraise 20X  Hip abduction 10X Hip extension 10X Marching 10X Nustep seat 10, arms 8, level 1 5 minutes (60-70 SPM)  08/18/2022  physical therapy evaluation and HEP instruction    PATIENT EDUCATION:  Educated Updated HEP Person educated: Patient Education method: Consulting civil engineer, Demonstration Education comprehension: verbalized understanding, returned demonstration, verbal cues required, and tactile cues required   HOME EXERCISE PROGRAM: Access Code: [email or web address] URL: [email or web address] Date: 08/18/2022 Prepared by: AP - Rehab Exercises - Sit to Stand  - 2 x daily - 7 x weekly - 1 sets - 5 reps - Seated Long Arc Quad  - 2 x daily - 7 x weekly - 2 sets - 10 reps - Seated March  - 2 x daily - 7 x weekly - 2 sets - 10 reps - Seated Heel Toe Raises  - 2 x daily - 7 x weekly - 2 sets - 10 reps  09/17/22 - Mini Squat with Counter Support  - 1-2 x daily - 5-7 x weekly - 2 sets - 10 reps - 3 hold  Date: 09/15/2022 Prepared by: Roseanne Reno Exercises - Sit to Stand  - 2 x daily - 7 x weekly - 1 sets - 5 reps - Seated Long Arc Quad  - 2 x daily - 7 x weekly - 2 sets - 10 reps - Standing Marching  - 2 x daily - 7 x weekly - 2 sets - 10 reps - Standing Heel  Raise with Support  - 2 x daily - 7 x weekly - 2 sets - 10 reps - Standing Hip Abduction with Counter Support  - 2 x daily - 7 x weekly - 2 sets - 10 reps - Standing Hip Extension with Counter Support  - 2 x daily - 7 x weekly - 2 sets - 10 reps   ASSESSMENT:  CLINICAL IMPRESSION: Continued focus on LE strength/stabilization exercises.  Cues required to complete therex more slowly/controlled as tends to use momentum.  Pt overall tolerated activities well, however required frequent rest  breaks due to fatigue. Postural cues also required, mainly on squats, hip exercises and step ups. Added vectors with good holds while standing on Lt but cues to increase stance time on Rt.  Difficulty using glute with tendency to flex knee instead.   Ended with nustep to work on activity tolerance.  Pt will continue to benefit from skilled PT to address his deficits and improve his overall function.  OBJECTIVE IMPAIRMENTS: cardiopulmonary status limiting activity, decreased activity tolerance, decreased endurance, decreased mobility, difficulty walking, decreased strength, and impaired perceived functional ability.   ACTIVITY LIMITATIONS: carrying, lifting, standing, squatting, stairs, locomotion level, and caring for others  PARTICIPATION LIMITATIONS: cleaning, shopping, community activity, and yard work  [employer] POTENTIAL: Good  CLINICAL DECISION MAKING: Stable/uncomplicated  EVALUATION COMPLEXITY: Low   GOALS: Goals reviewed with patient? Yes  SHORT TERM GOALS: Target date: 09/01/2022 patient will be independent with initial HEP  Baseline: Goal status: IN PROGRESS  2.  Patient will increase the distance on his 2 MWT to 360 ft to demonstrate improved functional gait and mobility Baseline: 336 ft Goal status: IN PROGRESS  LONG TERM GOALS: Target date: 09/15/2022  Patient will be independent in self management strategies to improve quality of life and functional outcomes.  Baseline:  Goal status: IN PROGRESS  2.  Patient will increase the distance on his 2 MWT to 400 ft ft to demonstrate improved functional gait and mobility Baseline: 336 ft Goal status: IN PROGRESS  3.  Patient will improve LEFS score by 5 points to demonstrate improved functional mobility   Baseline: 41/80 Goal status: IN PROGRESS  4.  Patient will increase  leg MMTs to 5/5 without pain to promote return to ambulation community distances with minimal deviation.  Baseline: see above Goal status: IN  PROGRESS  PLAN:  PT FREQUENCY: 2x/week  PT DURATION: 4 weeks  PLANNED INTERVENTIONS: Therapeutic exercises, Therapeutic activity, Neuromuscular re-education, Balance training, Gait training, Patient/Family education, Joint manipulation, Joint mobilization, Stair training, Orthotic/Fit training, DME instructions, Aquatic Therapy, Dry Needling, Electrical stimulation, Spinal manipulation, Spinal mobilization, Cryotherapy, Moist heat, Compression bandaging, scar mobilization, Splintting, Taping, Traction, Ultrasound, Ionotophoresis '4mg'$ /ml Dexamethasone, and Manual therapy   PLAN FOR NEXT SESSION: Continue POC and may progress as tolerated with emphasis on lower extremity strengthening, walking program, and cardiopulmonary endurance.   11:55 AM, 09/20/22 Teena Irani, PTA/CLT [place] Ph: [phone]

## 2022-09-22 ENCOUNTER — Ambulatory Visit (HOSPITAL_COMMUNITY): Payer: Medicare Other | Admitting: Physical Therapy

## 2022-09-22 DIAGNOSIS — R262 Difficulty in walking, not elsewhere classified: Secondary | ICD-10-CM | POA: Diagnosis not present

## 2022-09-22 DIAGNOSIS — M6281 Muscle weakness (generalized): Secondary | ICD-10-CM

## 2022-09-22 NOTE — Therapy (Signed)
OUTPATIENT PHYSICAL THERAPY TREATMENT     Patient Name: [name] MRN: [id number] DOB:09-Oct-1940, 82 y.o., male Today's Date: 09/20/2022  END OF SESSION:  PT End of Session - 09/20/22 1119     Visit Number 4    Number of Visits 8    Date for PT Re-Evaluation 10/14/22    Authorization Type Medicare Part A; [employer] MCR    Progress Note Due on Visit 10    PT Start Time 1115    PT Stop Time 1155    PT Time Calculation (min) 40 min    Activity Tolerance Patient limited by fatigue              Past Medical History:  Diagnosis Date   Bladder cancer ([place])    Bone cancer ([place])    BPH (benign prostatic hypertrophy)    Dysuria    GERD (gastroesophageal reflux disease)    History of adenomatous polyp of colon    tubular adenoma 06/ 2018   History of bladder cancer 12/2014   urologist-  dr [name]--  dx High Grade TCC without stromal invasion s/p TURBT and intravesical BCG tx/  recurrent 07/2015 (pTa) TCC  repear intravesical BCG tx    History of hiatal hernia    History of urethral stricture    Nocturia    Past Surgical History:  Procedure Laterality Date   CARDIAC CATHETERIZATION  1997   normal coronary arteries   CARDIOVASCULAR STRESS TEST  12-14-2005   Low risk perfusion study/  very small scar in the inferior septum from mid ventricle to apex,  no ischemia/  mild inferior septal hypokinesis/  ef 58%   CATARACT EXTRACTION W/ INTRAOCULAR LENS  IMPLANT, BILATERAL  2011   COLONOSCOPY  last one 06/ 2018   CYSTOSCOPY WITH BIOPSY N/A 08/12/2015   Procedure: CYSTOSCOPY WITH BIOPSY AND FULGERATION;  Surgeon: [name], MD;  Location: [facility];  Service: Urology;  Laterality: N/A;   CYSTOSCOPY WITH INJECTION N/A 06/29/2017   Procedure: CYSTOSCOPY WITH INJECTION OF INDOCYANINE GREEN DYE;  Surgeon: Alexis Frock, MD;  Location: [facility];  Service: Urology;  Laterality: N/A;   CYSTOSCOPY WITH URETHRAL DILATATION N/A 03/21/2017   Procedure:  CYSTOSCOPY;  Surgeon: [name], MD;  Location: [facility];  Service: Urology;  Laterality: N/A;   ESOPHAGOGASTRODUODENOSCOPY  12-05-2014   IR IMAGING GUIDED PORT INSERTION  02/07/2018   IR NEPHROSTOMY PLACEMENT LEFT  12/25/2021   IR REMOVAL TUN ACCESS W/ PORT W/O FL MOD SED  04/14/2018   TRANSTHORACIC ECHOCARDIOGRAM  01-31-2008   nomral LVF,  ef 55-65%/  mild pulmonic stenosis without regurg.,  peak transpulomonic valve gradient 24mHg   TRANSURETHRAL RESECTION OF BLADDER TUMOR N/A 12/31/2014   Procedure: TRANSURETHRAL RESECTION OF BLADDER TUMOR (TURBT);  Surgeon: MLowella Bandy MD;  [facility]  Service: Urology;  Laterality: N/A;   TRANSURETHRAL RESECTION OF BLADDER TUMOR N/A 03/21/2017   Procedure: POSSIBLE TRANSURETHRAL RESECTION OF BLADDER TUMOR (TURBT);  Surgeon: BNickie Retort MD;  [facility]  Service: Urology;  Laterality: N/A;   Patient Active Problem List   Diagnosis Date Noted   Generalized weakness 06/21/2022   Pleural effusion 06/21/2022   Paroxysmal atrial fibrillation ([place] 04/14/2022   C. 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 ([place] 10/30/2021   Sepsis secondary to urinary source 07/13/2020   Pseudomonas aeruginosa infection 06/18/2020   GERD (  gastroesophageal reflux disease)    Sinus tachycardia    Fever    Dehydration    Rigors    Severe sepsis ([facility])    Elevated lactic acid level    Cancer with pulmonary metastases ([facility]) 03/31/2020   Adverse drug reaction 06/05/2018   Bacteremia associated with intravascular line ([place]) 04/20/2018   Febrile neutropenia ([place]) 04/13/2018   Gram positive sepsis ([place]) 04/13/2018   SIRS (systemic inflammatory response syndrome) ([place]) 04/12/2018   Bacteremia due to Gram-positive bacteria 04/12/2018   Pancytopenia ([place]) 04/12/2018   Status post chemotherapy    UTI (urinary tract infection)  04/11/2018   Port-A-Cath in place 02/20/2018   Encounter for antineoplastic chemotherapy 02/17/2018   Sepsis due to urinary tract infection ([place]) 01/25/2018   Sepsis secondary to UTI ([place]) 01/25/2018   Hyperglycemia    Goals of care, counseling/discussion 01/18/2018   Bone metastasis 12/29/2017   Status post ileal conduit due to bladder cancer 06/29/2017   Bladder cancer ([place]) 06/28/2017   Stage IV malignant neoplasm of urinary bladder /with mets to the bones and lungs 04/20/2017   COPD (chronic obstructive pulmonary disease) ([place]) 03/07/2008    PCP: Dr. [name], MD  REFERRING PROVIDER: Dr. Orson Eva, MD  REFERRING DIAG:  Diagnosis  R53.1 (ICD-10-CM) - Generalized weakness  J90 (ICD-10-CM) - Pleural effusion    THERAPY DIAG:  Muscle weakness (generalized)  Difficulty in walking, not elsewhere classified  Rationale for Evaluation and Treatment: Rehabilitation  ONSET DATE: 3-4 months ago  SUBJECTIVE:   SUBJECTIVE STATEMENT: Patient states he is doing his exercises at home.  Getting stronger.  Reports morning his LE's feel the weakest when he first gets up.  Some Rt knee and hip pain from the cancer treatments.  Continues to have the cough he can't get rid of.   Evaluation:Legs are weak; been going on since hospitalization 3-4 months ago for an infection; laid around a lot afterwards so now am getting out breath and my legs get tired quickly.  My lungs were checked yesterday and look good.  Will have another PET scan in June; not currently taking any cancer treatments.  Urostomy present; bladder cancer 5 years ago.  PERTINENT HISTORY: Urostomy; bladder cancer with spread to Rt hip and radiation treatments  PAIN:  Are you having pain? No  PRECAUTIONS: None  WEIGHT BEARING RESTRICTIONS: No  FALLS:  Has patient fallen in last 6 months? No  LIVING ENVIRONMENT: Lives with: lives with their spouse Lives in: House/apartment Stairs: Yes: Internal: 8 steps; on right  going up Has following equipment at home: Single point cane, Walker - 2 wheeled, and chair lift  OCCUPATION: retired  PLOF: Independent  PATIENT GOALS: get my legs stronger  NEXT MD VISIT: June; cancer doctor  OBJECTIVE:   DIAGNOSTIC FINDINGS: none of legs  PATIENT SURVEYS:  LEFS 51.2%  COGNITION: Overall cognitive status: Within functional limits for tasks assessed     SENSATION: WFL  EDEMA:  None reported   LOWER EXTREMITY MMT:  MMT Right eval Left eval  Hip flexion 4+ 4+  Hip extension    Hip abduction    Hip adduction    Hip internal rotation    Hip external rotation    Knee flexion    Knee extension 4+ 4+  Ankle dorsiflexion 4 4  Ankle plantarflexion    Ankle inversion    Ankle eversion     (Blank rows = not tested)   FUNCTIONAL TESTS:  09/15/2022 5 times sit to stand:  20 sec 6/10 fatigue (was 16.8 sec 8/10 fatigue at evaluation) 2 minute walk test:  339 feet 6/10 fatigue(was 336 ft 6/10 fatigue at evaluation)  Evaluation:  5 times sit to stand: 16.8 sec 8/10 fatigue 2 minute walk test: 336 ft 6/10 fatigue  GAIT: Distance walked: 336 ft at evaluation Assistive device utilized: None Level of assistance: Modified independence Comments: slower than normal gait speed   TODAY'S TREATMENT:                                                                                                                              DATE:  09/22/22 Standing: with bil UE assist heelraise 20X  Toeraise 20X  Hip abduction 20X Hip extension 20X 4" forward lunges no UE 20X each Mini squats 20X Forward Step ups 6" 10 x 2 on each LE 1 UE assist and power up Lateral step ups 4" 10X2 on each LE 1 UE assist Stairs 7" with 1 HR 3RT reciprocally Vectors with 1 UE assist 10X3" each side Nustep seat 10, arms 8, level 2, 5 minutes  09/20/22 Standing: with bil UE assist heelraise 20X  Toeraise 20X  Hip abduction 20X Hip extension 20X Marching 20X Mini squats 20X Forward  Step ups 6" 10 x 2 on each LE 1 UE assist Lateral step ups 4" 10X2 on each LE 1 UE assist Vectors with 1 UE assist 10X3" each side Nustep seat 10, arms 8, level 2, 5 minutes  09/17/22 Sit to stands 10X from standard chair no UE Sitting:  LAQ x 3" x 10 x 2 Standing: with bil UE assist heelraise 20X  Toeraise 20X  Hip abduction 10X2 Hip extension 10X2 Marching 10X2 Mini squats x 10 x 2 x 3" Forward Step ups ,4" box, 10 x 2 on each LE (no UE support) Nustep seat 10, arms 8, level 2, 5 minutes  6/62/94 Re-cert; functional test measures Goal review 5 times sit to stand: 20 sec 6/10 fatigue (was 16.8 sec 8/10 fatigue at evaluation) 2 minute walk test:  339 feet 6/10 fatigue(was 336 ft 6/10 fatigue at evaluation) Sit to stands 10X from standard chair no UE Sitting:  HEP review LAQ, march, heel/toe raise Standing: with bil UE assist heelraise 20X  Toeraise 20X  Hip abduction 10X Hip extension 10X Marching 10X Nustep seat 10, arms 8, level 1 5 minutes (60-70 SPM)  08/18/2022  physical therapy evaluation and HEP instruction    PATIENT EDUCATION:  Educated Updated HEP Person educated: Patient Education method: Consulting civil engineer, Demonstration Education comprehension: verbalized understanding, returned demonstration, verbal cues required, and tactile cues required   HOME EXERCISE PROGRAM: Access Code: [email or web address] URL: [email or web address] Date: 08/18/2022 Prepared by: AP - Rehab Exercises - Sit to Stand  - 2 x daily - 7 x weekly - 1 sets - 5 reps - Seated Long Arc Quad  - 2 x daily - 7 x weekly - 2 sets - 10 reps - Seated March  -  2 x daily - 7 x weekly - 2 sets - 10 reps - Seated Heel Toe Raises  - 2 x daily - 7 x weekly - 2 sets - 10 reps  09/17/22 - Mini Squat with Counter Support  - 1-2 x daily - 5-7 x weekly - 2 sets - 10 reps - 3 hold  Date: 09/15/2022 Prepared by: Roseanne Reno Exercises - Sit to Stand  - 2 x daily - 7 x weekly - 1 sets - 5 reps - Seated Long  Arc Quad  - 2 x daily - 7 x weekly - 2 sets - 10 reps - Standing Marching  - 2 x daily - 7 x weekly - 2 sets - 10 reps - Standing Heel Raise with Support  - 2 x daily - 7 x weekly - 2 sets - 10 reps - Standing Hip Abduction with Counter Support  - 2 x daily - 7 x weekly - 2 sets - 10 reps - Standing Hip Extension with Counter Support  - 2 x daily - 7 x weekly - 2 sets - 10 reps   ASSESSMENT:  CLINICAL IMPRESSION: Focused on LE strength/stabilization exercises. Began stair negotiation with ability to complete reciprocally, however noted early fatigue and weakness in LE by last trip and increased shortness of breath.  Added power up movement to forward step ups to incorporate hip flexor strength and added stability challenge.  Pt continues to require Cues to complete therex more slowly/controlled as tends to use momentum.  Began lunges without UE asisst with overall good control, cues for keeping weight over toes as tends to lean into extension.  Pt overall tolerated activities well, however noted increased shortness of breath and overall fatigue.   Pt also c/o continued coughing spells and issues with his nose running more.  Encouraged to call MD and see about these symptoms and possibly earlier return to MD appt.  Ended with nustep to work on activity tolerance.  Pt will continue to benefit from skilled PT to address his deficits and improve his overall function.  OBJECTIVE IMPAIRMENTS: cardiopulmonary status limiting activity, decreased activity tolerance, decreased endurance, decreased mobility, difficulty walking, decreased strength, and impaired perceived functional ability.   ACTIVITY LIMITATIONS: carrying, lifting, standing, squatting, stairs, locomotion level, and caring for others  PARTICIPATION LIMITATIONS: cleaning, shopping, community activity, and yard work  [employer] POTENTIAL: Good  CLINICAL DECISION MAKING: Stable/uncomplicated  EVALUATION COMPLEXITY: Low   GOALS: Goals reviewed  with patient? Yes  SHORT TERM GOALS: Target date: 09/01/2022 patient will be independent with initial HEP  Baseline: Goal status: IN PROGRESS  2.  Patient will increase the distance on his 2 MWT to 360 ft to demonstrate improved functional gait and mobility Baseline: 336 ft Goal status: IN PROGRESS  LONG TERM GOALS: Target date: 09/15/2022  Patient will be independent in self management strategies to improve quality of life and functional outcomes.  Baseline:  Goal status: IN PROGRESS  2.  Patient will increase the distance on his 2 MWT to 400 ft ft to demonstrate improved functional gait and mobility Baseline: 336 ft Goal status: IN PROGRESS  3.  Patient will improve LEFS score by 5 points to demonstrate improved functional mobility   Baseline: 41/80 Goal status: IN PROGRESS  4.  Patient will increase  leg MMTs to 5/5 without pain to promote return to ambulation community distances with minimal deviation.  Baseline: see above Goal status: IN PROGRESS  PLAN:  PT FREQUENCY: 2x/week  PT DURATION: 4  weeks  PLANNED INTERVENTIONS: Therapeutic exercises, Therapeutic activity, Neuromuscular re-education, Balance training, Gait training, Patient/Family education, Joint manipulation, Joint mobilization, Stair training, Orthotic/Fit training, DME instructions, Aquatic Therapy, Dry Needling, Electrical stimulation, Spinal manipulation, Spinal mobilization, Cryotherapy, Moist heat, Compression bandaging, scar mobilization, Splintting, Taping, Traction, Ultrasound, Ionotophoresis '4mg'$ /ml Dexamethasone, and Manual therapy   PLAN FOR NEXT SESSION: Continue POC and may progress as tolerated with emphasis on lower extremity strengthening, walking program, and cardiopulmonary endurance. F/U if able to get earlier appt with MD due to persist ant cough and SOB.  11:55 AM, 09/20/22 Teena Irani, PTA/CLT [place] Ph: [phone]

## 2022-09-27 ENCOUNTER — Ambulatory Visit (INDEPENDENT_AMBULATORY_CARE_PROVIDER_SITE_OTHER): Payer: Medicare Other

## 2022-09-27 ENCOUNTER — Ambulatory Visit (HOSPITAL_COMMUNITY): Payer: Medicare Other

## 2022-09-27 DIAGNOSIS — M6281 Muscle weakness (generalized): Secondary | ICD-10-CM

## 2022-09-27 DIAGNOSIS — R262 Difficulty in walking, not elsewhere classified: Secondary | ICD-10-CM | POA: Diagnosis not present

## 2022-09-27 DIAGNOSIS — L501 Idiopathic urticaria: Secondary | ICD-10-CM | POA: Diagnosis not present

## 2022-09-27 NOTE — Therapy (Signed)
OUTPATIENT PHYSICAL THERAPY TREATMENT     Patient Name: [name] MRN: [id number] DOB:[birth year], 82 y.o., male Today's Date: 09/27/2022  END OF SESSION:  PT End of Session - 09/27/22 1027     Visit Number 6    Number of Visits 8    Date for PT Re-Evaluation 10/14/22    Authorization Type Medicare Part A; [employer] MCR    Progress Note Due on Visit 10    PT Start Time 1028    PT Stop Time 1109    PT Time Calculation (min) 41 min    Activity Tolerance Patient limited by fatigue    Behavior During Therapy [facility] for tasks assessed/performed              Past Medical History:  Diagnosis Date   Bladder cancer ([place])    Bone cancer ([place])    BPH (benign prostatic hypertrophy)    Dysuria    GERD (gastroesophageal reflux disease)    History of adenomatous polyp of colon    tubular adenoma 06/ 2018   History of bladder cancer 12/2014   urologist-  dr [name]--  dx High Grade TCC without stromal invasion s/p TURBT and intravesical BCG tx/  recurrent 07/2015 (pTa) TCC  repear intravesical BCG tx    History of hiatal hernia    History of urethral stricture    Nocturia    Past Surgical History:  Procedure Laterality Date   CARDIAC CATHETERIZATION  1997   normal coronary arteries   CARDIOVASCULAR STRESS TEST  12-14-2005   Low risk perfusion study/  very small scar in the inferior septum from mid ventricle to apex,  no ischemia/  mild inferior septal hypokinesis/  ef 58%   CATARACT EXTRACTION W/ INTRAOCULAR LENS  IMPLANT, BILATERAL  2011   COLONOSCOPY  last one 06/ 2018   CYSTOSCOPY WITH BIOPSY N/A 08/12/2015   Procedure: CYSTOSCOPY WITH BIOPSY AND FULGERATION;  Surgeon: [name], MD;  Location: [facility];  Service: Urology;  Laterality: N/A;   CYSTOSCOPY WITH INJECTION N/A 06/29/2017   Procedure: CYSTOSCOPY WITH INJECTION OF INDOCYANINE GREEN DYE;  Surgeon: Alexis Frock, MD;  Location: [facility];  Service: Urology;  Laterality: N/A;    CYSTOSCOPY WITH URETHRAL DILATATION N/A 03/21/2017   Procedure: CYSTOSCOPY;  Surgeon: [name], MD;  Location: [facility];  Service: Urology;  Laterality: N/A;   ESOPHAGOGASTRODUODENOSCOPY  12-05-2014   IR IMAGING GUIDED PORT INSERTION  02/07/2018   IR NEPHROSTOMY PLACEMENT LEFT  12/25/2021   IR REMOVAL TUN ACCESS W/ PORT W/O FL MOD SED  04/14/2018   TRANSTHORACIC ECHOCARDIOGRAM  01-31-2008   nomral LVF,  ef 55-65%/  mild pulmonic stenosis without regurg.,  peak transpulomonic valve gradient 53mHg   TRANSURETHRAL RESECTION OF BLADDER TUMOR N/A 12/31/2014   Procedure: TRANSURETHRAL RESECTION OF BLADDER TUMOR (TURBT);  Surgeon: MLowella Bandy MD;  [facility]  Service: Urology;  Laterality: N/A;   TRANSURETHRAL RESECTION OF BLADDER TUMOR N/A 03/21/2017   Procedure: POSSIBLE TRANSURETHRAL RESECTION OF BLADDER TUMOR (TURBT);  Surgeon: BNickie Retort MD;  [facility]  Service: Urology;  Laterality: N/A;   Patient Active Problem List   Diagnosis Date Noted   Generalized weakness 06/21/2022   Pleural effusion 06/21/2022   Paroxysmal atrial fibrillation ([facility] 04/14/2022   C. 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 ([facility] 10/30/2021   Sepsis secondary to urinary source  07/13/2020   Pseudomonas aeruginosa infection 06/18/2020   GERD (gastroesophageal reflux disease)    Sinus tachycardia    Fever    Dehydration    Rigors    Severe sepsis (H[facility])    Elevated lactic acid level    Cancer with pulmonary metastases (H[facility]) 03/31/2020   Adverse drug reaction 06/05/2018   Bacteremia associated with intravascular line ([place]) 04/20/2018   Febrile neutropenia ([place]) 04/13/2018   Gram positive sepsis ([place]) 04/13/2018   SIRS (systemic inflammatory response syndrome) ([place]) 04/12/2018   Bacteremia due to Gram-positive bacteria 04/12/2018   Pancytopenia ([place]) 04/12/2018    Status post chemotherapy    UTI (urinary tract infection) 04/11/2018   Port-A-Cath in place 02/20/2018   Encounter for antineoplastic chemotherapy 02/17/2018   Sepsis due to urinary tract infection ([place]) 01/25/2018   Sepsis secondary to UTI ([place]) 01/25/2018   Hyperglycemia    Goals of care, counseling/discussion 01/18/2018   Bone metastasis 12/29/2017   Status post ileal conduit due to bladder cancer 06/29/2017   Bladder cancer ([place]) 06/28/2017   Stage IV malignant neoplasm of urinary bladder /with mets to the bones and lungs 04/20/2017   COPD (chronic obstructive pulmonary disease) ([place]) 03/07/2008    PCP: Dr. [name], MD  REFERRING PROVIDER: Dr. Orson Eva, MD  REFERRING DIAG:  Diagnosis  R53.1 (ICD-10-CM) - Generalized weakness  J90 (ICD-10-CM) - Pleural effusion    THERAPY DIAG:  Muscle weakness (generalized)  Difficulty in walking, not elsewhere classified  Rationale for Evaluation and Treatment: Rehabilitation  ONSET DATE: 3-4 months ago  SUBJECTIVE:   SUBJECTIVE STATEMENT: Continues with cough.  States he sometimes gets up some white phlegm.  Plans to call the doctor this week. Right leg hurts "It always hurts". Reports right hip pain from cancer; states they "won't do anything about it" because it's not getting any bigger or moving.  Sometimes at night it is pretty painful and has to take pain meds.    Evaluation:Legs are weak; been going on since hospitalization 3-4 months ago for an infection; laid around a lot afterwards so now am getting out breath and my legs get tired quickly.  My lungs were checked yesterday and look good.  Will have another PET scan in June; not currently taking any cancer treatments.  Urostomy present; bladder cancer 5 years ago.  PERTINENT HISTORY: Urostomy; bladder cancer with spread to Rt hip and radiation treatments  PAIN:  Are you having pain? No  PRECAUTIONS: None  WEIGHT BEARING RESTRICTIONS: No  FALLS:  Has patient  fallen in last 6 months? No  LIVING ENVIRONMENT: Lives with: lives with their spouse Lives in: House/apartment Stairs: Yes: Internal: 8 steps; on right going up Has following equipment at home: Single point cane, Walker - 2 wheeled, and chair lift  OCCUPATION: retired  PLOF: Independent  PATIENT GOALS: get my legs stronger  NEXT MD VISIT: June; cancer doctor  OBJECTIVE:   DIAGNOSTIC FINDINGS: none of legs  PATIENT SURVEYS:  LEFS 51.2%  COGNITION: Overall cognitive status: Within functional limits for tasks assessed     SENSATION: WFL  EDEMA:  None reported   LOWER EXTREMITY MMT:  MMT Right eval Left eval  Hip flexion 4+ 4+  Hip extension    Hip abduction    Hip adduction    Hip internal rotation    Hip external rotation    Knee flexion    Knee extension 4+ 4+  Ankle dorsiflexion 4 4  Ankle plantarflexion    Ankle inversion  Ankle eversion     (Blank rows = not tested)   FUNCTIONAL TESTS:  09/15/2022 5 times sit to stand: 20 sec 6/10 fatigue (was 16.8 sec 8/10 fatigue at evaluation) 2 minute walk test:  339 feet 6/10 fatigue(was 336 ft 6/10 fatigue at evaluation)  Evaluation:  5 times sit to stand: 16.8 sec 8/10 fatigue 2 minute walk test: 336 ft 6/10 fatigue  GAIT: Distance walked: 336 ft at evaluation Assistive device utilized: None Level of assistance: Modified independence Comments: slower than normal gait speed   TODAY'S TREATMENT:                                                                                                                              DATE:  09/27/22 Heel raises x 20 Toe raises x 20 Hip abduction x 20 Hip extension x 20 each 4" box mini lunges 2 x 10 each Mini squats x 10 4" box step ups  x 10 each 4" box lateral step ups x 10 each  Nustep seat 10; arms 8 level 2 x 5'    09/22/22 Standing: with bil UE assist heelraise 20X  Toeraise 20X  Hip abduction 20X Hip extension 20X 4" forward lunges no UE 20X  each Mini squats 20X Forward Step ups 6" 10 x 2 on each LE 1 UE assist and power up Lateral step ups 4" 10X2 on each LE 1 UE assist Stairs 7" with 1 HR 3RT reciprocally Vectors with 1 UE assist 10X3" each side Nustep seat 10, arms 8, level 2, 5 minutes  09/20/22 Standing: with bil UE assist heelraise 20X  Toeraise 20X  Hip abduction 20X Hip extension 20X Marching 20X Mini squats 20X Forward Step ups 6" 10 x 2 on each LE 1 UE assist Lateral step ups 4" 10X2 on each LE 1 UE assist Vectors with 1 UE assist 10X3" each side Nustep seat 10, arms 8, level 2, 5 minutes  09/17/22 Sit to stands 10X from standard chair no UE Sitting:  LAQ x 3" x 10 x 2 Standing: with bil UE assist heelraise 20X  Toeraise 20X  Hip abduction 10X2 Hip extension 10X2 Marching 10X2 Mini squats x 10 x 2 x 3" Forward Step ups ,4" box, 10 x 2 on each LE (no UE support) Nustep seat 10, arms 8, level 2, 5 minutes  0000000 Re-cert; functional test measures Goal review 5 times sit to stand: 20 sec 6/10 fatigue (was 16.8 sec 8/10 fatigue at evaluation) 2 minute walk test:  339 feet 6/10 fatigue(was 336 ft 6/10 fatigue at evaluation) Sit to stands 10X from standard chair no UE Sitting:  HEP review LAQ, march, heel/toe raise Standing: with bil UE assist heelraise 20X  Toeraise 20X  Hip abduction 10X Hip extension 10X Marching 10X Nustep seat 10, arms 8, level 1 5 minutes (60-70 SPM)  08/18/2022  physical therapy evaluation and HEP instruction    PATIENT EDUCATION:  Educated Updated HEP  Person educated: Patient Education method: Explanation, Demonstration Education comprehension: verbalized understanding, returned demonstration, verbal cues required, and tactile cues required   HOME EXERCISE PROGRAM: Access Code: [email or web address] URL: [email or web address] Date: 08/18/2022 Prepared by: AP - Rehab Exercises - Sit to Stand  - 2 x daily - 7 x weekly - 1 sets - 5 reps - Seated Long Arc Quad  - 2  x daily - 7 x weekly - 2 sets - 10 reps - Seated March  - 2 x daily - 7 x weekly - 2 sets - 10 reps - Seated Heel Toe Raises  - 2 x daily - 7 x weekly - 2 sets - 10 reps  09/17/22 - Mini Squat with Counter Support  - 1-2 x daily - 5-7 x weekly - 2 sets - 10 reps - 3 hold  Date: 09/15/2022 Prepared by: Roseanne Reno Exercises - Sit to Stand  - 2 x daily - 7 x weekly - 1 sets - 5 reps - Seated Long Arc Quad  - 2 x daily - 7 x weekly - 2 sets - 10 reps - Standing Marching  - 2 x daily - 7 x weekly - 2 sets - 10 reps - Standing Heel Raise with Support  - 2 x daily - 7 x weekly - 2 sets - 10 reps - Standing Hip Abduction with Counter Support  - 2 x daily - 7 x weekly - 2 sets - 10 reps - Standing Hip Extension with Counter Support  - 2 x daily - 7 x weekly - 2 sets - 10 reps   ASSESSMENT:  CLINICAL IMPRESSION: Focused on LE strength/stabilization exercises. Needs cues for pacing and technique. Difficulty performing mini squats; has a hard time keeping his knees from shooting forward past his toes.  Decreased balance noted as he starts to fatigue; needs frequent short rest breaks.  Continues with nagging cough during treatment.  Pt will continue to benefit from skilled PT to address his deficits and improve his overall function.  OBJECTIVE IMPAIRMENTS: cardiopulmonary status limiting activity, decreased activity tolerance, decreased endurance, decreased mobility, difficulty walking, decreased strength, and impaired perceived functional ability.   ACTIVITY LIMITATIONS: carrying, lifting, standing, squatting, stairs, locomotion level, and caring for others  PARTICIPATION LIMITATIONS: cleaning, shopping, community activity, and yard work  [employer] POTENTIAL: Good  CLINICAL DECISION MAKING: Stable/uncomplicated  EVALUATION COMPLEXITY: Low   GOALS: Goals reviewed with patient? Yes  SHORT TERM GOALS: Target date: 09/01/2022 patient will be independent with initial HEP  Baseline: Goal status:  IN PROGRESS  2.  Patient will increase the distance on his 2 MWT to 360 ft to demonstrate improved functional gait and mobility Baseline: 336 ft Goal status: IN PROGRESS  LONG TERM GOALS: Target date: 09/15/2022  Patient will be independent in self management strategies to improve quality of life and functional outcomes.  Baseline:  Goal status: IN PROGRESS  2.  Patient will increase the distance on his 2 MWT to 400 ft ft to demonstrate improved functional gait and mobility Baseline: 336 ft Goal status: IN PROGRESS  3.  Patient will improve LEFS score by 5 points to demonstrate improved functional mobility   Baseline: 41/80 Goal status: IN PROGRESS  4.  Patient will increase  leg MMTs to 5/5 without pain to promote return to ambulation community distances with minimal deviation.  Baseline: see above Goal status: IN PROGRESS  PLAN:  PT FREQUENCY: 2x/week  PT DURATION: 4 weeks  PLANNED INTERVENTIONS: Therapeutic exercises, Therapeutic activity, Neuromuscular re-education,  Balance training, Gait training, Patient/Family education, Joint manipulation, Joint mobilization, Stair training, Orthotic/Fit training, DME instructions, Aquatic Therapy, Dry Needling, Electrical stimulation, Spinal manipulation, Spinal mobilization, Cryotherapy, Moist heat, Compression bandaging, scar mobilization, Splintting, Taping, Traction, Ultrasound, Ionotophoresis 82m/ml Dexamethasone, and Manual therapy   PLAN FOR NEXT SESSION: Continue POC and may progress as tolerated with emphasis on lower extremity strengthening, walking program, and cardiopulmonary endurance. F/U if able to get earlier appt with MD due to persist ant cough and SOB.  11:08 AM, 09/27/22 Khayree Delellis Small Reagann Dolce MPT [facility] [place] #[phone]

## 2022-09-29 ENCOUNTER — Ambulatory Visit (HOSPITAL_COMMUNITY): Payer: Medicare Other

## 2022-09-29 DIAGNOSIS — R262 Difficulty in walking, not elsewhere classified: Secondary | ICD-10-CM

## 2022-09-29 DIAGNOSIS — M6281 Muscle weakness (generalized): Secondary | ICD-10-CM

## 2022-09-29 NOTE — Therapy (Signed)
OUTPATIENT PHYSICAL THERAPY TREATMENT     Patient Name: [name] MRN: [id number] DOB:Apr 03, 1941, 82 y.o., male Today's Date: 09/29/2022  END OF SESSION:  PT End of Session - 09/29/22 1039     Visit Number 7    Number of Visits 8    Date for PT Re-Evaluation 10/14/22    Authorization Type Medicare Part A; [employer] MCR    Progress Note Due on Visit 10    PT Start Time 1035    PT Stop Time 1115    PT Time Calculation (min) 40 min    Activity Tolerance Patient tolerated treatment well    Behavior During Therapy WFL for tasks assessed/performed            Past Medical History:  Diagnosis Date   Bladder cancer ([place])    Bone cancer ([place])    BPH (benign prostatic hypertrophy)    Dysuria    GERD (gastroesophageal reflux disease)    History of adenomatous polyp of colon    tubular adenoma 06/ 2018   History of bladder cancer 12/2014   urologist-  dr [name]--  dx High Grade TCC without stromal invasion s/p TURBT and intravesical BCG tx/  recurrent 07/2015 (pTa) TCC  repear intravesical BCG tx    History of hiatal hernia    History of urethral stricture    Nocturia    Past Surgical History:  Procedure Laterality Date   CARDIAC CATHETERIZATION  1997   normal coronary arteries   CARDIOVASCULAR STRESS TEST  12-14-2005   Low risk perfusion study/  very small scar in the inferior septum from mid ventricle to apex,  no ischemia/  mild inferior septal hypokinesis/  ef 58%   CATARACT EXTRACTION W/ INTRAOCULAR LENS  IMPLANT, BILATERAL  2011   COLONOSCOPY  last one 06/ 2018   CYSTOSCOPY WITH BIOPSY N/A 08/12/2015   Procedure: CYSTOSCOPY WITH BIOPSY AND FULGERATION;  Surgeon: [name], MD;  Location: [facility];  Service: Urology;  Laterality: N/A;   CYSTOSCOPY WITH INJECTION N/A 06/29/2017   Procedure: CYSTOSCOPY WITH INJECTION OF INDOCYANINE GREEN DYE;  Surgeon: Alexis Frock, MD;  Location: [facility];  Service: Urology;  Laterality: N/A;    CYSTOSCOPY WITH URETHRAL DILATATION N/A 03/21/2017   Procedure: CYSTOSCOPY;  Surgeon: [name], MD;  Location: [facility];  Service: Urology;  Laterality: N/A;   ESOPHAGOGASTRODUODENOSCOPY  12-05-2014   IR IMAGING GUIDED PORT INSERTION  02/07/2018   IR NEPHROSTOMY PLACEMENT LEFT  12/25/2021   IR REMOVAL TUN ACCESS W/ PORT W/O FL MOD SED  04/14/2018   TRANSTHORACIC ECHOCARDIOGRAM  01-31-2008   nomral LVF,  ef 55-65%/  mild pulmonic stenosis without regurg.,  peak transpulomonic valve gradient 29mHg   TRANSURETHRAL RESECTION OF BLADDER TUMOR N/A 12/31/2014   Procedure: TRANSURETHRAL RESECTION OF BLADDER TUMOR (TURBT);  Surgeon: MLowella Bandy MD;  [facility]  Service: Urology;  Laterality: N/A;   TRANSURETHRAL RESECTION OF BLADDER TUMOR N/A 03/21/2017   Procedure: POSSIBLE TRANSURETHRAL RESECTION OF BLADDER TUMOR (TURBT);  Surgeon: BNickie Retort MD;  [facility]  Service: Urology;  Laterality: N/A;   Patient Active Problem List   Diagnosis Date Noted   Generalized weakness 06/21/2022   Pleural effusion 06/21/2022   Paroxysmal atrial fibrillation ([place] 04/14/2022   C. 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 ([place] 10/30/2021   Sepsis secondary to urinary source 07/13/2020  Pseudomonas aeruginosa infection 06/18/2020   GERD (gastroesophageal reflux disease)    Sinus tachycardia    Fever    Dehydration    Rigors    Severe sepsis (H[facility])    Elevated lactic acid level    Cancer with pulmonary metastases (H[facility]) 03/31/2020   Adverse drug reaction 06/05/2018   Bacteremia associated with intravascular line ([place]) 04/20/2018   Febrile neutropenia ([place]) 04/13/2018   Gram positive sepsis ([place]) 04/13/2018   SIRS (systemic inflammatory response syndrome) ([place]) 04/12/2018   Bacteremia due to Gram-positive bacteria 04/12/2018   Pancytopenia ([place]) 04/12/2018    Status post chemotherapy    UTI (urinary tract infection) 04/11/2018   Port-A-Cath in place 02/20/2018   Encounter for antineoplastic chemotherapy 02/17/2018   Sepsis due to urinary tract infection ([place]) 01/25/2018   Sepsis secondary to UTI ([place]) 01/25/2018   Hyperglycemia    Goals of care, counseling/discussion 01/18/2018   Bone metastasis 12/29/2017   Status post ileal conduit due to bladder cancer 06/29/2017   Bladder cancer ([place]) 06/28/2017   Stage IV malignant neoplasm of urinary bladder /with mets to the bones and lungs 04/20/2017   COPD (chronic obstructive pulmonary disease) ([place]) 03/07/2008    PCP: Dr. [name], MD  REFERRING PROVIDER: Dr. Orson Eva, MD  REFERRING DIAG:  Diagnosis  R53.1 (ICD-10-CM) - Generalized weakness  J90 (ICD-10-CM) - Pleural effusion    THERAPY DIAG:  Muscle weakness (generalized)  Difficulty in walking, not elsewhere classified  Rationale for Evaluation and Treatment: Rehabilitation  ONSET DATE: 3-4 months ago  SUBJECTIVE:   SUBJECTIVE STATEMENT: Currently reports of R hip pain = 3/10. Claims that pain is due to cancer. Patient reports that LE still feels weak. Denies recent falls/trauma. Patient states that his cough is a little better today.  Evaluation:Legs are weak; been going on since hospitalization 3-4 months ago for an infection; laid around a lot afterwards so now am getting out breath and my legs get tired quickly.  My lungs were checked yesterday and look good.  Will have another PET scan in June; not currently taking any cancer treatments.  Urostomy present; bladder cancer 5 years ago.  PERTINENT HISTORY: Urostomy; bladder cancer with spread to Rt hip and radiation treatments  PAIN:  Are you having pain?   PRECAUTIONS: None  WEIGHT BEARING RESTRICTIONS: No  FALLS:  Has patient fallen in last 6 months? No  LIVING ENVIRONMENT: Lives with: lives with their spouse Lives in: House/apartment Stairs: Yes: Internal:  8 steps; on right going up Has following equipment at home: Single point cane, Walker - 2 wheeled, and chair lift  OCCUPATION: retired  PLOF: Independent  PATIENT GOALS: get my legs stronger  NEXT MD VISIT: June; cancer doctor  OBJECTIVE:   DIAGNOSTIC FINDINGS: none of legs  PATIENT SURVEYS:  LEFS 51.2%  COGNITION: Overall cognitive status: Within functional limits for tasks assessed     SENSATION: WFL  EDEMA:  None reported   LOWER EXTREMITY MMT:  MMT Right eval Left eval  Hip flexion 4+ 4+  Hip extension    Hip abduction    Hip adduction    Hip internal rotation    Hip external rotation    Knee flexion    Knee extension 4+ 4+  Ankle dorsiflexion 4 4  Ankle plantarflexion    Ankle inversion    Ankle eversion     (Blank rows = not tested)   FUNCTIONAL TESTS:  09/15/2022 5 times sit to stand: 20 sec 6/10 fatigue (was 16.8  sec 8/10 fatigue at evaluation) 2 minute walk test:  339 feet 6/10 fatigue(was 336 ft 6/10 fatigue at evaluation)  Evaluation:  5 times sit to stand: 16.8 sec 8/10 fatigue 2 minute walk test: 336 ft 6/10 fatigue  GAIT: Distance walked: 336 ft at evaluation Assistive device utilized: None Level of assistance: Modified independence Comments: slower than normal gait speed   TODAY'S TREATMENT:                                                                                                                              DATE:  09/29/22 Seated: LAQ x 3" x 10 x 2 x 2 lb Hamstring stretch x 30" x 3 Gastrocnemius stretch with a strap x 30" Standing: with bil UE assist  Gastrocnemius slant board stretch x 30" x 2 Heel raise, 2 lb, 20X  Toe raise, 2 lb, 20X  Hip abduction, 2 lb, 10X2 Hip extension, 2 lb, 10X2 Marching, 2 lb, 10X2 Hamstring curls, 2 lbs, 10 x 2 Forward Step ups ,6" box, 10 x 2 on each LE (no UE support)  09/27/22 Heel raises x 20 Toe raises x 20 Hip abduction x 20 Hip extension x 20 each 4" box mini lunges 2 x 10  each Mini squats x 10 4" box step ups  x 10 each 4" box lateral step ups x 10 each  Nustep seat 10; arms 8 level 2 x 5'  09/22/22 Standing: with bil UE assist heelraise 20X  Toeraise 20X  Hip abduction 20X Hip extension 20X 4" forward lunges no UE 20X each Mini squats 20X Forward Step ups 6" 10 x 2 on each LE 1 UE assist and power up Lateral step ups 4" 10X2 on each LE 1 UE assist Stairs 7" with 1 HR 3RT reciprocally Vectors with 1 UE assist 10X3" each side Nustep seat 10, arms 8, level 2, 5 minutes  09/20/22 Standing: with bil UE assist heelraise 20X  Toeraise 20X  Hip abduction 20X Hip extension 20X Marching 20X Mini squats 20X Forward Step ups 6" 10 x 2 on each LE 1 UE assist Lateral step ups 4" 10X2 on each LE 1 UE assist Vectors with 1 UE assist 10X3" each side Nustep seat 10, arms 8, level 2, 5 minutes  09/17/22 Sit to stands 10X from standard chair no UE Sitting:  LAQ x 3" x 10 x 2 Standing: with bil UE assist heelraise 20X  Toeraise 20X  Hip abduction 10X2 Hip extension 10X2 Marching 10X2 Mini squats x 10 x 2 x 3" Forward Step ups ,4" box, 10 x 2 on each LE (no UE support) Nustep seat 10, arms 8, level 2, 5 minutes  0000000 Re-cert; functional test measures Goal review 5 times sit to stand: 20 sec 6/10 fatigue (was 16.8 sec 8/10 fatigue at evaluation) 2 minute walk test:  339 feet 6/10 fatigue(was 336 ft 6/10 fatigue at evaluation) Sit to stands 10X from standard chair no  UE Sitting:  HEP review LAQ, march, heel/toe raise Standing: with bil UE assist heelraise 20X  Toeraise 20X  Hip abduction 10X Hip extension 10X Marching 10X Nustep seat 10, arms 8, level 1 5 minutes (60-70 SPM)  08/18/2022  physical therapy evaluation and HEP instruction    PATIENT EDUCATION:  Educated Updated HEP Person educated: Patient Education method: Consulting civil engineer, Demonstration Education comprehension: verbalized understanding, returned demonstration, verbal cues  required, and tactile cues required   HOME EXERCISE PROGRAM: Access Code: [email or web address] URL: [email or web address]  09/29/22 - Seated Hamstring Stretch  - 1-2 x daily - 5-7 x weekly - 3 reps - 30 hold - Seated Calf Stretch with Strap  - 1-2 x daily - 5-7 x weekly - 3 reps - 30 hold 09/17/22 - Mini Squat with Counter Support  - 1-2 x daily - 5-7 x weekly - 2 sets - 10 reps - 3 hold  Date: 09/15/2022 Prepared by: Roseanne Reno Exercises - Sit to Stand  - 2 x daily - 7 x weekly - 1 sets - 5 reps - Seated Long Arc Quad  - 2 x daily - 7 x weekly - 2 sets - 10 reps - Standing Marching  - 2 x daily - 7 x weekly - 2 sets - 10 reps - Standing Heel Raise with Support  - 2 x daily - 7 x weekly - 2 sets - 10 reps - Standing Hip Abduction with Counter Support  - 2 x daily - 7 x weekly - 2 sets - 10 reps - Standing Hip Extension with Counter Support  - 2 x daily - 7 x weekly - 2 sets - 10 reps  Date: 08/18/2022 Prepared by: [facility] - Rehab Exercises - Sit to Stand  - 2 x daily - 7 x weekly - 1 sets - 5 reps - Seated Long Arc Quad  - 2 x daily - 7 x weekly - 2 sets - 10 reps - Seated March  - 2 x daily - 7 x weekly - 2 sets - 10 reps - Seated Heel Toe Raises  - 2 x daily - 7 x weekly - 2 sets - 10 reps  ASSESSMENT:  CLINICAL IMPRESSION:  Interventions today were geared towards LE strengthening and flexibility. Tolerated all activities without worsening of symptoms. Demonstrated mid to moderate levels of fatigue due to weakness.  Rest periods provided. Pacing of activities was slightly slow today. Noted to hold on to the knees for stabilization during step ups indicating quads weakness. Required mild amount of cueing to ensure correct execution of activity. To date, skilled PT is required to address the impairments and improve function.  OBJECTIVE IMPAIRMENTS: cardiopulmonary status limiting activity, decreased activity tolerance, decreased endurance, decreased mobility, difficulty walking,  decreased strength, and impaired perceived functional ability.   ACTIVITY LIMITATIONS: carrying, lifting, standing, squatting, stairs, locomotion level, and caring for others  PARTICIPATION LIMITATIONS: cleaning, shopping, community activity, and yard work  [employer] POTENTIAL: Good  CLINICAL DECISION MAKING: Stable/uncomplicated  EVALUATION COMPLEXITY: Low   GOALS: Goals reviewed with patient? Yes  SHORT TERM GOALS: Target date: 09/01/2022 patient will be independent with initial HEP  Baseline: Goal status: IN PROGRESS  2.  Patient will increase the distance on his 2 MWT to 360 ft to demonstrate improved functional gait and mobility Baseline: 336 ft Goal status: IN PROGRESS  LONG TERM GOALS: Target date: 09/15/2022  Patient will be independent in self management strategies to improve quality of life and functional outcomes.  Baseline:  Goal  status: IN PROGRESS  2.  Patient will increase the distance on his 2 MWT to 400 ft ft to demonstrate improved functional gait and mobility Baseline: 336 ft Goal status: IN PROGRESS  3.  Patient will improve LEFS score by 5 points to demonstrate improved functional mobility   Baseline: 41/80 Goal status: IN PROGRESS  4.  Patient will increase  leg MMTs to 5/5 without pain to promote return to ambulation community distances with minimal deviation.  Baseline: see above Goal status: IN PROGRESS  PLAN:  PT FREQUENCY: 2x/week  PT DURATION: 4 weeks  PLANNED INTERVENTIONS: Therapeutic exercises, Therapeutic activity, Neuromuscular re-education, Balance training, Gait training, Patient/Family education, Joint manipulation, Joint mobilization, Stair training, Orthotic/Fit training, DME instructions, Aquatic Therapy, Dry Needling, Electrical stimulation, Spinal manipulation, Spinal mobilization, Cryotherapy, Moist heat, Compression bandaging, scar mobilization, Splintting, Taping, Traction, Ultrasound, Ionotophoresis 57m/ml Dexamethasone,  and Manual therapy   PLAN FOR NEXT SESSION: Continue POC and may progress as tolerated with emphasis on lower extremity strengthening, walking program, and cardiopulmonary endurance. F/U if able to get earlier appt with MD due to persist ant cough and SOB.  KHarvie Heck Megon Kalina, PT, DPT, OCS Board-Certified Clinical Specialist in [place] ([place]: [id number] 11:02 AM, 09/29/22

## 2022-10-04 ENCOUNTER — Encounter (HOSPITAL_COMMUNITY): Payer: Medicare Other

## 2022-10-06 ENCOUNTER — Ambulatory Visit (HOSPITAL_COMMUNITY): Payer: Medicare Other

## 2022-10-06 DIAGNOSIS — R262 Difficulty in walking, not elsewhere classified: Secondary | ICD-10-CM

## 2022-10-06 DIAGNOSIS — M6281 Muscle weakness (generalized): Secondary | ICD-10-CM | POA: Diagnosis not present

## 2022-10-06 NOTE — Therapy (Signed)
OUTPATIENT PHYSICAL THERAPY TREATMENT/ DISCHARGE VISIT PHYSICAL THERAPY DISCHARGE SUMMARY  Visits from Start of Care: 8  Current functional level related to goals / functional outcomes: See below   Remaining deficits: See below   Education / Equipment: See below   Patient agrees to discharge. Patient goals were partially met. Patient is being discharged due to being pleased with the current functional level.      Patient Name: [name] MRN: [id number] DOB:[birth year], 82 y.o., male Today's Date: 10/06/2022  END OF SESSION:  PT End of Session - 10/06/22 1041     Visit Number 8    Number of Visits 8    Date for PT Re-Evaluation 10/14/22    Authorization Type Medicare Part A; [employer] MCR    Progress Note Due on Visit 10    PT Start Time 1035    PT Stop Time 1115    PT Time Calculation (min) 40 min    Activity Tolerance Patient tolerated treatment well    Behavior During Therapy WFL for tasks assessed/performed            Past Medical History:  Diagnosis Date   Bladder cancer ([place])    Bone cancer ([place])    BPH (benign prostatic hypertrophy)    Dysuria    GERD (gastroesophageal reflux disease)    History of adenomatous polyp of colon    tubular adenoma 06/ 2018   History of bladder cancer 12/2014   urologist-  dr [name]--  dx High Grade TCC without stromal invasion s/p TURBT and intravesical BCG tx/  recurrent 07/2015 (pTa) TCC  repear intravesical BCG tx    History of hiatal hernia    History of urethral stricture    Nocturia    Past Surgical History:  Procedure Laterality Date   CARDIAC CATHETERIZATION  1997   normal coronary arteries   CARDIOVASCULAR STRESS TEST  12-14-2005   Low risk perfusion study/  very small scar in the inferior septum from mid ventricle to apex,  no ischemia/  mild inferior septal hypokinesis/  ef 58%   CATARACT EXTRACTION W/ INTRAOCULAR LENS  IMPLANT, BILATERAL  2011   COLONOSCOPY  last one 06/ 2018   CYSTOSCOPY WITH  BIOPSY N/A 08/12/2015   Procedure: CYSTOSCOPY WITH BIOPSY AND FULGERATION;  Surgeon: [name], MD;  Location[facility];  Service: Urology;  Laterality: N/A;   CYSTOSCOPY WITH INJECTION N/A 06/29/2017   Procedure: CYSTOSCOPY WITH INJECTION OF INDOCYANINE GREEN DYE;  Surgeon: Alexis Frock, MD;  Location: [facility];  Service: Urology;  Laterality: N/A;   CYSTOSCOPY WITH URETHRAL DILATATION N/A 03/21/2017   Procedure: CYSTOSCOPY;  Surgeon: [name], MD;  Location: [facility];  Service: Urology;  Laterality: N/A;   ESOPHAGOGASTRODUODENOSCOPY  12-05-2014   IR IMAGING GUIDED PORT INSERTION  02/07/2018   IR NEPHROSTOMY PLACEMENT LEFT  12/25/2021   IR REMOVAL TUN ACCESS W/ PORT W/O FL MOD SED  04/14/2018   TRANSTHORACIC ECHOCARDIOGRAM  01-31-2008   nomral LVF,  ef 55-65%/  mild pulmonic stenosis without regurg.,  peak transpulomonic valve gradient 43mHg   TRANSURETHRAL RESECTION OF BLADDER TUMOR N/A 12/31/2014   Procedure: TRANSURETHRAL RESECTION OF BLADDER TUMOR (TURBT);  Surgeon: MLowella Bandy MD;  [facility]  Service: Urology;  Laterality: N/A;   TRANSURETHRAL RESECTION OF BLADDER TUMOR N/A 03/21/2017   Procedure: POSSIBLE TRANSURETHRAL RESECTION OF BLADDER TUMOR (TURBT);  Surgeon: BNickie Retort MD;  [facility]  Service: Urology;  Laterality: N/A;   Patient Active Problem List   Diagnosis Date Noted   Generalized weakness 06/21/2022   Pleural effusion 06/21/2022   Paroxysmal atrial fibrillation ([place]) 04/14/2022   C. 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 ([place]) 10/30/2021   Sepsis secondary to urinary source 07/13/2020   Pseudomonas aeruginosa infection 06/18/2020   GERD (gastroesophageal reflux disease)    Sinus tachycardia    Fever    Dehydration    Rigors    Severe sepsis ([facility])    Elevated lactic acid level     Cancer with pulmonary metastases ([facility]) 03/31/2020   Adverse drug reaction 06/05/2018   Bacteremia associated with intravascular line ([place]) 04/20/2018   Febrile neutropenia ([place]) 04/13/2018   Gram positive sepsis ([place]) 04/13/2018   SIRS (systemic inflammatory response syndrome) ([place]) 04/12/2018   Bacteremia due to Gram-positive bacteria 04/12/2018   Pancytopenia ([place]) 04/12/2018   Status post chemotherapy    UTI (urinary tract infection) 04/11/2018   Port-A-Cath in place 02/20/2018   Encounter for antineoplastic chemotherapy 02/17/2018   Sepsis due to urinary tract infection ([place]) 01/25/2018   Sepsis secondary to UTI ([place]) 01/25/2018   Hyperglycemia    Goals of care, counseling/discussion 01/18/2018   Bone metastasis 12/29/2017   Status post ileal conduit due to bladder cancer 06/29/2017   Bladder cancer ([place]) 06/28/2017   Stage IV malignant neoplasm of urinary bladder /with mets to the bones and lungs 04/20/2017   COPD (chronic obstructive pulmonary disease) ([place]) 03/07/2008    PCP: Dr. [name], MD  REFERRING PROVIDER: Dr. Orson Eva, MD  REFERRING DIAG:  Diagnosis  R53.1 (ICD-10-CM) - Generalized weakness  J90 (ICD-10-CM) - Pleural effusion    THERAPY DIAG:  Muscle weakness (generalized)  Difficulty in walking, not elsewhere classified  Rationale for Evaluation and Treatment: Rehabilitation  ONSET DATE: 3-4 months ago  SUBJECTIVE:   SUBJECTIVE STATEMENT: Right hip still some soreness; 5/10; "I don't see a big change " since starting therapy   Evaluation:Legs are weak; been going on since hospitalization 3-4 months ago for an infection; laid around a lot afterwards so now am getting out breath and my legs get tired quickly.  My lungs were checked yesterday and look good.  Will have another PET scan in June; not currently taking any cancer treatments.  Urostomy present; bladder cancer 5 years ago.  PERTINENT HISTORY: Urostomy; bladder cancer with spread to Rt  hip and radiation treatments  PAIN:  Are you having pain?   PRECAUTIONS: None  WEIGHT BEARING RESTRICTIONS: No  FALLS:  Has patient fallen in last 6 months? No  LIVING ENVIRONMENT: Lives with: lives with their spouse Lives in: House/apartment Stairs: Yes: Internal: 8 steps; on right going up Has following equipment at home: Single point cane, Walker - 2 wheeled, and chair lift  OCCUPATION: retired  PLOF: Independent  PATIENT GOALS: get my legs stronger  NEXT MD VISIT: June; cancer doctor  OBJECTIVE:   DIAGNOSTIC FINDINGS: none of legs  PATIENT SURVEYS:  LEFS 51.2%  COGNITION: Overall cognitive status: Within functional limits for tasks assessed     SENSATION: WFL  EDEMA:  None reported   LOWER EXTREMITY MMT:  MMT Right eval Left eval Right 10/06/22 Left 10/06/22  Hip flexion 4+ 4+ 5 5  Hip extension      Hip abduction      Hip adduction      Hip internal rotation  Hip external rotation      Knee flexion      Knee extension 4+ 4+ 5 5  Ankle dorsiflexion 4 4 5 5  $ Ankle plantarflexion      Ankle inversion      Ankle eversion       (Blank rows = not tested)   FUNCTIONAL TESTS:  09/15/2022 5 times sit to stand: 20 sec 6/10 fatigue (was 16.8 sec 8/10 fatigue at evaluation) 2 minute walk test:  339 feet 6/10 fatigue(was 336 ft 6/10 fatigue at evaluation)  Evaluation:  5 times sit to stand: 16.8 sec 8/10 fatigue 2 minute walk test: 336 ft 6/10 fatigue  GAIT: Distance walked: 336 ft at evaluation Assistive device utilized: None Level of assistance: Modified independence Comments: slower than normal gait speed   TODAY'S TREATMENT:                                                                                                                              DATE:  10/06/22 Progress note 5 x sit to stand 14.1 sec 2 MWT 375 ft LEFS 50/80 62.5% MMT's see above Review of HEP  Seated: LAQ's 2 # 2 x 10 Marching 2# 2 x 10 Hamstring stretch with  strap 3 x 20"   Standing: Heel raises 2 x 10 Hip abduction 2 x 10 Hip extension 2 x 10      09/29/22 Seated: LAQ x 3" x 10 x 2 x 2 lb Hamstring stretch x 30" x 3 Gastrocnemius stretch with a strap x 30" Standing: with bil UE assist  Gastrocnemius slant board stretch x 30" x 2 Heel raise, 2 lb, 20X  Toe raise, 2 lb, 20X  Hip abduction, 2 lb, 10X2 Hip extension, 2 lb, 10X2 Marching, 2 lb, 10X2 Hamstring curls, 2 lbs, 10 x 2 Forward Step ups ,6" box, 10 x 2 on each LE (no UE support)  09/27/22 Heel raises x 20 Toe raises x 20 Hip abduction x 20 Hip extension x 20 each 4" box mini lunges 2 x 10 each Mini squats x 10 4" box step ups  x 10 each 4" box lateral step ups x 10 each  Nustep seat 10; arms 8 level 2 x 5'  09/22/22 Standing: with bil UE assist heelraise 20X  Toeraise 20X  Hip abduction 20X Hip extension 20X 4" forward lunges no UE 20X each Mini squats 20X Forward Step ups 6" 10 x 2 on each LE 1 UE assist and power up Lateral step ups 4" 10X2 on each LE 1 UE assist Stairs 7" with 1 HR 3RT reciprocally Vectors with 1 UE assist 10X3" each side Nustep seat 10, arms 8, level 2, 5 minutes  09/20/22 Standing: with bil UE assist heelraise 20X  Toeraise 20X  Hip abduction 20X Hip extension 20X Marching 20X Mini squats 20X Forward Step ups 6" 10 x 2 on each LE 1 UE assist Lateral step ups 4" 10X2 on  each LE 1 UE assist Vectors with 1 UE assist 10X3" each side Nustep seat 10, arms 8, level 2, 5 minutes  09/17/22 Sit to stands 10X from standard chair no UE Sitting:  LAQ x 3" x 10 x 2 Standing: with bil UE assist heelraise 20X  Toeraise 20X  Hip abduction 10X2 Hip extension 10X2 Marching 10X2 Mini squats x 10 x 2 x 3" Forward Step ups ,4" box, 10 x 2 on each LE (no UE support) Nustep seat 10, arms 8, level 2, 5 minutes  0000000 Re-cert; functional test measures Goal review 5 times sit to stand: 20 sec 6/10 fatigue (was 16.8 sec 8/10 fatigue at  evaluation) 2 minute walk test:  339 feet 6/10 fatigue(was 336 ft 6/10 fatigue at evaluation) Sit to stands 10X from standard chair no UE Sitting:  HEP review LAQ, march, heel/toe raise Standing: with bil UE assist heelraise 20X  Toeraise 20X  Hip abduction 10X Hip extension 10X Marching 10X Nustep seat 10, arms 8, level 1 5 minutes (60-70 SPM)  08/18/2022  physical therapy evaluation and HEP instruction    PATIENT EDUCATION:  Educated Updated HEP Person educated: Patient Education method: Consulting civil engineer, Demonstration Education comprehension: verbalized understanding, returned demonstration, verbal cues required, and tactile cues required   HOME EXERCISE PROGRAM: Access Code: [email or web address] URL: [email or web address]  09/29/22 - Seated Hamstring Stretch  - 1-2 x daily - 5-7 x weekly - 3 reps - 30 hold - Seated Calf Stretch with Strap  - 1-2 x daily - 5-7 x weekly - 3 reps - 30 hold 09/17/22 - Mini Squat with Counter Support  - 1-2 x daily - 5-7 x weekly - 2 sets - 10 reps - 3 hold  Date: 09/15/2022 Prepared by: Roseanne Reno Exercises - Sit to Stand  - 2 x daily - 7 x weekly - 1 sets - 5 reps - Seated Long Arc Quad  - 2 x daily - 7 x weekly - 2 sets - 10 reps - Standing Marching  - 2 x daily - 7 x weekly - 2 sets - 10 reps - Standing Heel Raise with Support  - 2 x daily - 7 x weekly - 2 sets - 10 reps - Standing Hip Abduction with Counter Support  - 2 x daily - 7 x weekly - 2 sets - 10 reps - Standing Hip Extension with Counter Support  - 2 x daily - 7 x weekly - 2 sets - 10 reps  Date: 08/18/2022 Prepared by: AP - Rehab Exercises - Sit to Stand  - 2 x daily - 7 x weekly - 1 sets - 5 reps - Seated Long Arc Quad  - 2 x daily - 7 x weekly - 2 sets - 10 reps - Seated March  - 2 x daily - 7 x weekly - 2 sets - 10 reps - Seated Heel Toe Raises  - 2 x daily - 7 x weekly - 2 sets - 10 reps  ASSESSMENT:  CLINICAL IMPRESSION:  Progress note today.  Patient has met 2/2  and 3/4 set rehab goals.  He is agreeable to discharge at this time.  Discussed with patient beginning a walking program and continuing with HEP and he verbalizes understanding.   OBJECTIVE IMPAIRMENTS: cardiopulmonary status limiting activity, decreased activity tolerance, decreased endurance, decreased mobility, difficulty walking, decreased strength, and impaired perceived functional ability.   ACTIVITY LIMITATIONS: carrying, lifting, standing, squatting, stairs, locomotion level, and caring for others  PARTICIPATION LIMITATIONS: cleaning, shopping, community  activity, and yard work  [employer] POTENTIAL: Good  CLINICAL DECISION MAKING: Stable/uncomplicated  EVALUATION COMPLEXITY: Low   GOALS: Goals reviewed with patient? Yes  SHORT TERM GOALS: Target date: 09/01/2022 patient will be independent with initial HEP  Baseline: Goal status: MET  2.  Patient will increase the distance on his 2 MWT to 360 ft to demonstrate improved functional gait and mobility Baseline: 336 ft Goal status: MET  LONG TERM GOALS: Target date: 09/15/2022  Patient will be independent in self management strategies to improve quality of life and functional outcomes.  Baseline:  Goal status: MET  2.  Patient will increase the distance on his 2 MWT to 400 ft ft to demonstrate improved functional gait and mobility Baseline: 336 ft; 375 ft 10/06/22 Goal status: IN PROGRESS  3.  Patient will improve LEFS score by 5 points to demonstrate improved functional mobility   Baseline: 41/80; 50/80 10/06/22 Goal status: MET  4.  Patient will increase  leg MMTs to 5/5 without pain to promote return to ambulation community distances with minimal deviation.  Baseline: see above Goal status: IN PROGRESS  PLAN:  PT FREQUENCY: 2x/week  PT DURATION: 4 weeks  PLANNED INTERVENTIONS: Therapeutic exercises, Therapeutic activity, Neuromuscular re-education, Balance training, Gait training, Patient/Family education, Joint  manipulation, Joint mobilization, Stair training, Orthotic/Fit training, DME instructions, Aquatic Therapy, Dry Needling, Electrical stimulation, Spinal manipulation, Spinal mobilization, Cryotherapy, Moist heat, Compression bandaging, scar mobilization, Splintting, Taping, Traction, Ultrasound, Ionotophoresis 36m/ml Dexamethasone, and Manual therapy   PLAN FOR NEXT SESSION: discharge  11:15 AM, 10/06/22 Anaid Haney Small Jareli Highland MPT [facility] [place] #[phone]

## 2022-10-11 ENCOUNTER — Telehealth: Payer: Self-pay

## 2022-10-11 ENCOUNTER — Ambulatory Visit (INDEPENDENT_AMBULATORY_CARE_PROVIDER_SITE_OTHER): Payer: Medicare Other

## 2022-10-11 DIAGNOSIS — L501 Idiopathic urticaria: Secondary | ICD-10-CM

## 2022-10-11 MED ORDER — FAMOTIDINE 40 MG PO TABS: 40.0000 mg | ORAL_TABLET | Freq: Two times a day (BID) | ORAL | 3 refills | Status: DC

## 2022-10-11 MED ORDER — CETIRIZINE HCL 10 MG PO TABS: 20.0000 mg | ORAL_TABLET | Freq: Two times a day (BID) | ORAL | 2 refills | Status: DC

## 2022-10-11 NOTE — Telephone Encounter (Signed)
Called and spoke to patient and informed him of the note. Patient is scheduled to see Webb Silversmith on Wednesday. I have put the medications in for him to pick up tomorrow.

## 2022-10-11 NOTE — Addendum Note (Signed)
Addended by: Julius Bowels on: 10/11/2022 06:59 PM   Modules accepted: Orders

## 2022-10-11 NOTE — Telephone Encounter (Signed)
Patient came in for his Xolair injections today and expressed that he was having issues with his legs. He showed me his legs which appears to be a rash. Patient is requesting Prednisone be sent in as he expressed that is what Dr. Ernst Bowler prescribed last time. Please advise on refill.

## 2022-10-12 ENCOUNTER — Encounter: Payer: Self-pay | Admitting: Internal Medicine

## 2022-10-12 ENCOUNTER — Ambulatory Visit: Payer: Medicare Other | Attending: Internal Medicine | Admitting: Internal Medicine

## 2022-10-12 VITALS — BP 116/72 | HR 76 | Ht 71.0 in | Wt 173.0 lb

## 2022-10-12 DIAGNOSIS — I480 Paroxysmal atrial fibrillation: Secondary | ICD-10-CM

## 2022-10-12 NOTE — Patient Instructions (Signed)
Medication Instructions:  Your physician recommends that you continue on your current medications as directed. Please refer to the Current Medication list given to you today.  *If you need a refill on your cardiac medications before your next appointment, please call your pharmacy*   Lab Work: NONE   If you have labs (blood work) drawn today and your tests are completely normal, you will receive your results only by: [place] (if you have MyChart) OR A paper copy in the mail If you have any lab test that is abnormal or we need to change your treatment, we will call you to review the results.   Testing/Procedures: NONE     Follow-Up: At [facility], you and your health needs are our priority.  As part of our continuing mission to provide you with exceptional heart care, we have created designated Provider Care Teams.  These Care Teams include your primary Cardiologist (physician) and Advanced Practice Providers (APPs -  Physician Assistants and Nurse Practitioners) who all work together to provide you with the care you need, when you need it.  We recommend signing up for the patient portal called "MyChart".  Sign up information is provided on this After Visit Summary.  MyChart is used to connect with patients for Virtual Visits (Telemedicine).  Patients are able to view lab/test results, encounter notes, upcoming appointments, etc.  Non-urgent messages can be sent to your provider as well.   To learn more about what you can do with MyChart, go to [email or web address].    Your next appointment:   1 year(s)  Provider:   Cristopher Peru, MD    Other Instructions Thank you for choosing [facility]!

## 2022-10-12 NOTE — Progress Notes (Signed)
HPI [name] returns for ongoing evaluation of atrial fib with a CVR. He is a pleasant 82 yo man with bladder CA who was found to have atrial fib with a CVR. He was asymptomatic. He was started on eliquis and after about a month developed hematuria and was treated for a UTI. His hematuria resolved. He feels well and cannot tell when he is in vs out of rhythm. He denies chest pain, sob or syncope.   Allergies  Allergen Reactions   Ciprofloxacin Hives   Zofran [Ondansetron] Rash     Current Outpatient Medications  Medication Sig Dispense Refill   acetaminophen (TYLENOL) 500 MG tablet Take 1,000 mg by mouth every 6 (six) hours as needed for mild pain, fever, moderate pain or headache.     apixaban (ELIQUIS) 5 MG TABS tablet Take 1 tablet (5 mg total) by mouth 2 (two) times daily. 60 tablet 2   EPINEPHrine 0.3 mg/0.3 mL IJ SOAJ injection 0.3 mg once as needed for anaphylaxis.     metoprolol tartrate (LOPRESSOR) 25 MG tablet Take 1 tablet (25 mg total) by mouth 2 (two) times daily. 60 tablet 2   pantoprazole (PROTONIX) 40 MG tablet Take 1 tablet (40 mg total) by mouth daily. 30 tablet 1   polyethylene glycol (MIRALAX / GLYCOLAX) 17 g packet Take 17 g by mouth daily as needed for mild constipation. 14 each 0   azithromycin (ZITHROMAX Z-PAK) 250 MG tablet Take 2 tablets day 1 and then 1 daily for 4 days (Patient not taking: Reported on 10/12/2022) 6 each 0   cetirizine (ZYRTEC) 10 MG tablet Take 2 tablets (20 mg total) by mouth 2 (two) times daily. (Patient not taking: Reported on 10/12/2022) 120 tablet 2   clotrimazole-betamethasone (LOTRISONE) cream Apply 1 application  topically in the morning and at bedtime. (Patient not taking: Reported on 10/12/2022)     famotidine (PEPCID) 40 MG tablet Take 1 tablet (40 mg total) by mouth 2 (two) times daily. (Patient not taking: Reported on 10/12/2022) 60 tablet 3   HYDROmorphone (DILAUDID) 4 MG tablet Take 1 tablet (4 mg total) by mouth every 4 (four)  hours as needed for severe pain. (Patient not taking: Reported on 10/12/2022) 60 tablet 0   lactose free nutrition (BOOST) LIQD Take 237 mLs by mouth 3 (three) times daily between meals.  (Patient not taking: Reported on 10/12/2022)     methenamine (HIPREX) 1 g tablet Take 1 tablet (1 g total) by mouth 2 (two) times daily with a meal. (Patient not taking: Reported on 10/12/2022) 60 tablet 11   oxyCODONE (OXY IR/ROXICODONE) 5 MG immediate release tablet Take 1 tablet (5 mg total) by mouth every 4 (four) hours as needed for severe pain. (Patient not taking: Reported on 10/12/2022) 60 tablet 0   predniSONE (DELTASONE) 10 MG tablet Take two tablets ('20mg'$ ) twice daily for three days, then one tablet ('10mg'$ ) twice daily for three days, then STOP. (Patient not taking: Reported on 10/12/2022) 18 tablet 0   triamcinolone ointment (KENALOG) 0.1 % Apply 1 Application topically 2 (two) times daily. Do not put onto the face. (Patient not taking: Reported on 10/12/2022) 454 g 0   Current Facility-Administered Medications  Medication Dose Route Frequency Provider Last Rate Last Admin   omalizumab Arvid Right) prefilled syringe 300 mg  300 mg Subcutaneous Q14 Days Valentina Shaggy, MD   300 mg at 10/11/22 [id number]     Past Medical History:  Diagnosis Date   Bladder  cancer ([place])    Bone cancer ([place])    BPH (benign prostatic hypertrophy)    Dysuria    GERD (gastroesophageal reflux disease)    History of adenomatous polyp of colon    tubular adenoma 06/ 2018   History of bladder cancer 12/2014   urologist-  dr [name]--  dx High Grade TCC without stromal invasion s/p TURBT and intravesical BCG tx/  recurrent 07/2015 (pTa) TCC  repear intravesical BCG tx    History of hiatal hernia    History of urethral stricture    Nocturia     ROS:   All systems reviewed and negative except as noted in the HPI.   Past Surgical History:  Procedure Laterality Date   CARDIAC CATHETERIZATION  1997   normal coronary arteries    CARDIOVASCULAR STRESS TEST  12-14-2005   Low risk perfusion study/  very small scar in the inferior septum from mid ventricle to apex,  no ischemia/  mild inferior septal hypokinesis/  ef 58%   CATARACT EXTRACTION W/ INTRAOCULAR LENS  IMPLANT, BILATERAL  2011   COLONOSCOPY  last one 06/ 2018   CYSTOSCOPY WITH BIOPSY N/A 08/12/2015   Procedure: CYSTOSCOPY WITH BIOPSY AND FULGERATION;  Surgeon: [name], MD;  Location: [facility];  Service: Urology;  Laterality: N/A;   CYSTOSCOPY WITH INJECTION N/A 06/29/2017   Procedure: CYSTOSCOPY WITH INJECTION OF INDOCYANINE GREEN DYE;  Surgeon: Alexis Frock, MD;  Location: [facility];  Service: Urology;  Laterality: N/A;   CYSTOSCOPY WITH URETHRAL DILATATION N/A 03/21/2017   Procedure: CYSTOSCOPY;  Surgeon: [name], MD;  Location: [facility];  Service: Urology;  Laterality: N/A;   ESOPHAGOGASTRODUODENOSCOPY  12-05-2014   IR IMAGING GUIDED PORT INSERTION  02/07/2018   IR NEPHROSTOMY PLACEMENT LEFT  12/25/2021   IR REMOVAL TUN ACCESS W/ PORT W/O FL MOD SED  04/14/2018   TRANSTHORACIC ECHOCARDIOGRAM  01-31-2008   nomral LVF,  ef 55-65%/  mild pulmonic stenosis without regurg.,  peak transpulomonic valve gradient 46mHg   TRANSURETHRAL RESECTION OF BLADDER TUMOR N/A 12/31/2014   Procedure: TRANSURETHRAL RESECTION OF BLADDER TUMOR (TURBT);  Surgeon: MLowella Bandy MD;  [facility]  Service: Urology;  Laterality: N/A;   TRANSURETHRAL RESECTION OF BLADDER TUMOR N/A 03/21/2017   Procedure: POSSIBLE TRANSURETHRAL RESECTION OF BLADDER TUMOR (TURBT);  Surgeon: BNickie Retort MD;  [facility]  Service: Urology;  Laterality: N/A;     Family History  Problem Relation Age of Onset   Alcoholism Father    Colon cancer Neg Hx    Colon polyps Neg Hx    Diabetes Neg Hx    Kidney disease Neg Hx    Esophageal cancer Neg Hx    Heart disease Neg Hx    Gallbladder disease Neg  Hx    Allergic rhinitis Neg Hx    Angioedema Neg Hx    Asthma Neg Hx    Atopy Neg Hx    Eczema Neg Hx    Immunodeficiency Neg Hx    Urticaria Neg Hx      Social History   Socioeconomic History   Marital status: Married    Spouse name: Not on file   Number of children: 1   Years of education: Not on file   Highest education level: Not on file  Occupational History   Occupation: Retired  Tobacco Use   Smoking status: Former    Packs/day: 1.00    Years: 34.00  Total pack years: 34.00    Types: Cigarettes    Quit date: 12/26/1988    Years since quitting: 33.8   Smokeless tobacco: Never  Vaping Use   Vaping Use: Never used  Substance and Sexual Activity   Alcohol use: Yes    Alcohol/week: 6.0 standard drinks of alcohol    Types: 6 Cans of beer per week    Comment: BEER   Drug use: No   Sexual activity: Not Currently  Other Topics Concern   Not on file  Social History Narrative   Not on file   Social Determinants of Health   Financial Resource Strain: Not on file  Food Insecurity: No Food Insecurity (08/17/2022)   Hunger Vital Sign    Worried About Running Out of Food in the Last Year: Never true    Ran Out of Food in the Last Year: Never true  Transportation Needs: No Transportation Needs (08/17/2022)   PRAPARE - Hydrologist (Medical): No    Lack of Transportation (Non-Medical): No  Physical Activity: Not on file  Stress: Not on file  Social Connections: Not on file  Intimate Partner Violence: Not At Risk (08/17/2022)   Humiliation, Afraid, Rape, and Kick questionnaire    Fear of Current or Ex-Partner: No    Emotionally Abused: No    Physically Abused: No    Sexually Abused: No     BP 116/72   Pulse 76   Ht '5\' 11"'$  (1.803 m)   Wt 173 lb (78.5 kg)   SpO2 96%   BMI 24.13 kg/m   Physical Exam:  Well appearing NAD HEENT: Unremarkable Neck:  No JVD, no thyromegally Lymphatics:  No adenopathy Back:  No CVA tenderness Lungs:   Clear with no wheezes HEART:  Regular rate rhythm, no murmurs, no rubs, no clicks Abd:  soft, positive bowel sounds, no organomegally, no rebound, no guarding Ext:  2 plus pulses, no edema, no cyanosis, no clubbing Skin:  No rashes no nodules Neuro:  CN II through XII intact, motor grossly intact   DEVICE  Normal device function.  See PaceArt for details.   Assess/Plan:  PAF - on exam he is back in rhythm. I asked him to continue his anti-coag. His rates are controlled. If he has more hematuria, he might not be a candidate for eliquis. His age may make him relatively contraindicated for watchman though his overall condition might be an exception.  No indication for AA drug at this time as he is asymptomatic. Coags - his bleeding has stopped. HTN -his bp is well controlled.   Carleene Overlie Aurora Rody,MD

## 2022-10-13 ENCOUNTER — Other Ambulatory Visit: Payer: Self-pay

## 2022-10-13 ENCOUNTER — Ambulatory Visit (INDEPENDENT_AMBULATORY_CARE_PROVIDER_SITE_OTHER): Payer: Medicare Other | Admitting: Family Medicine

## 2022-10-13 VITALS — BP 120/58 | HR 83 | Temp 98.1°F | Resp 18 | Ht 71.0 in | Wt 185.8 lb

## 2022-10-13 DIAGNOSIS — L501 Idiopathic urticaria: Secondary | ICD-10-CM | POA: Diagnosis not present

## 2022-10-13 DIAGNOSIS — I878 Other specified disorders of veins: Secondary | ICD-10-CM

## 2022-10-13 DIAGNOSIS — R21 Rash and other nonspecific skin eruption: Secondary | ICD-10-CM

## 2022-10-13 MED ORDER — CLOBETASOL PROPIONATE 0.05 % EX OINT: TOPICAL_OINTMENT | CUTANEOUS | 0 refills | Status: DC

## 2022-10-13 NOTE — Patient Instructions (Signed)
Chronic urticaria Continue cetirizine 10 mg twice a day as needed for hives or itch Continue Xolair injections 300 mg once every 2 weeks and have access to an epinephrine autoinjector set per protocol If your symptoms re-occur, begin a journal of events that occurred for up to 6 hours before your symptoms began including foods and beverages consumed, soaps or perfumes you had contact with, and medications.   Rash vs venous stasis Continue a daily moisturizing routine Begin clobetasol 0.05% twice a day for the next 1-2 weeks. This will replace triamcinolone for right now Get some compression stockings and wear them during the hours you are awake.  Check in with your primary care provider for further management   Call the clinic if this treatment plan is not working well for you  Follow up in 2 months or sooner if needed.

## 2022-10-13 NOTE — Progress Notes (Unsigned)
[address], [address] [place] [address] Dept: [phone]  FOLLOW UP NOTE  Patient ID: [name], male    DOB: 1941-08-04  Age: 82 y.o. MRN: [id number] Date of Office Visit: 10/13/2022  Assessment  Chief Complaint: Rash (Rash on left leg for been going on about 5 days )  HPI [name] is an 82 year old male who presents to the clinic for evaluation of a rash on his legs.  He was last seen in this clinic on 09/08/2022 by Dr. Ernst Bowler for evaluation of urticaria.  He did require prednisone at his last visit.  He continues Xolair 300 mg once every 2 weeks.  At today's visit, he reports that he developed a red and itchy rash on his right leg about a week ago. He reports that he does periodically get a similar rash and this has been occurring frequently over about the last 10 years. He reports pruritus and denies leg pain. He has recently started taking cetirizine 10 mg twice a day and is currently using a daily moisturizing routine and triamcinolone with mild relief of symptoms. He continues Xolair injections 300 mg once every 2 weeks with moderate relief of hives and itch. He does report that when the medications run out (cetirizine and topical treatments) he stops using them and does not get a refill.   His current medications are listed in the chart.   Drug Allergies:  Allergies  Allergen Reactions   Ciprofloxacin Hives   Zofran [Ondansetron] Rash    Physical Exam: There were no vitals taken for this visit.   Physical Exam Vitals reviewed.  Constitutional:      Appearance: Normal appearance.  HENT:     Head: Normocephalic and atraumatic.     Right Ear: Tympanic membrane normal.     Left Ear: Tympanic membrane normal.     Nose:     Comments: Bilateral nares normal. Pharynx normal. Ears normal. Eyes normal.    Mouth/Throat:     Pharynx: Oropharynx is clear.  Eyes:     Conjunctiva/sclera: Conjunctivae normal.  Cardiovascular:     Rate and Rhythm: Normal  rate and regular rhythm.     Heart sounds: Normal heart sounds. No murmur heard. Pulmonary:     Effort: Pulmonary effort is normal.     Breath sounds: Normal breath sounds.     Comments: Lung sounds clear to auscultation Musculoskeletal:        General: Normal range of motion.     Cervical back: Normal range of motion and neck supple.  Skin:    Comments: Left leg with large erythematous patch covering the shin and lateral aspect of the calf. Edges not well defined. Pitted edema 2+ left leg. Right leg with slight edema in the lower extremity. No open areas or drainage noted.   Neurological:     Mental Status: He is alert and oriented to person, place, and time.  Psychiatric:        Mood and Affect: Mood normal.        Behavior: Behavior normal.        Thought Content: Thought content normal.        Judgment: Judgment normal.      Diagnostics:    Assessment and Plan: No diagnosis found.  No orders of the defined types were placed in this encounter.   Patient Instructions  Chronic urticaria Continue cetirizine 10 mg twice a day as needed for hives or itch Continue Xolair injections 300 mg  once every 2 weeks and have access to an epinephrine autoinjector set per protocol If your symptoms re-occur, begin a journal of events that occurred for up to 6 hours before your symptoms began including foods and beverages consumed, soaps or perfumes you had contact with, and medications.   Rash  Call the clinic if this treatment plan is not working well for you  Follow up in *** or sooner if needed.   No follow-ups on file.    Thank you for the opportunity to care for this patient.  Please do not hesitate to contact me with questions.  Gareth Morgan, FNP [facility]

## 2022-10-14 ENCOUNTER — Encounter: Payer: Self-pay | Admitting: Family Medicine

## 2022-10-14 DIAGNOSIS — I878 Other specified disorders of veins: Secondary | ICD-10-CM | POA: Insufficient documentation

## 2022-10-14 DIAGNOSIS — L501 Idiopathic urticaria: Secondary | ICD-10-CM | POA: Insufficient documentation

## 2022-10-14 DIAGNOSIS — R21 Rash and other nonspecific skin eruption: Secondary | ICD-10-CM | POA: Insufficient documentation

## 2022-10-15 ENCOUNTER — Ambulatory Visit (INDEPENDENT_AMBULATORY_CARE_PROVIDER_SITE_OTHER): Payer: Medicare Other

## 2022-10-15 ENCOUNTER — Ambulatory Visit: Payer: Medicare Other

## 2022-10-15 ENCOUNTER — Encounter: Payer: Self-pay | Admitting: Pulmonary Disease

## 2022-10-15 ENCOUNTER — Ambulatory Visit (INDEPENDENT_AMBULATORY_CARE_PROVIDER_SITE_OTHER): Payer: Medicare Other | Admitting: Pulmonary Disease

## 2022-10-15 ENCOUNTER — Other Ambulatory Visit: Payer: Self-pay | Admitting: Pulmonary Disease

## 2022-10-15 VITALS — BP 118/68 | HR 76 | Ht 71.0 in | Wt 185.0 lb

## 2022-10-15 DIAGNOSIS — J90 Pleural effusion, not elsewhere classified: Secondary | ICD-10-CM

## 2022-10-15 DIAGNOSIS — R0602 Shortness of breath: Secondary | ICD-10-CM | POA: Diagnosis not present

## 2022-10-15 MED ORDER — TRELEGY ELLIPTA 100-62.5-25 MCG/ACT IN AEPB: 1.0000 | INHALATION_SPRAY | Freq: Every day | RESPIRATORY_TRACT | 1 refills | Status: DC

## 2022-10-15 NOTE — Progress Notes (Signed)
[name]    [id number]    02-02-1941  Primary Care Physician:Fusco, Purcell Nails, MD  Referring Physician: [name], [address] [place],  [place] [address]  Chief complaint:   Patient with cough, shortness of breath  HPI:  Chronic cough process  Recently treated with a course of antibiotics and steroids Did have a thoracentesis for significant amount of fluid, cytology was negative  Breathing just has not gone back to baseline  Had radiation treatment to paratracheal lymph node in 2021 and April 2023 History of bladder cancer for which he is on treatment  He had a PET scan in December showing recurrence of right pleural effusion  He had been told about bilateral pleural effusions when he had his previous thoracentesis but the left side was not drained at the time  The PET scan in December did not reveal significant pleural effusion on the left  Continues to have cough and  Reformed smoker quit about 30 to 40 years ago, was not labeled with any lung disease  Recent thoracentesis was significant for exudative effusion  Has been more sedentary lately   Outpatient Encounter Medications as of 10/15/2022  Medication Sig   acetaminophen (TYLENOL) 500 MG tablet Take 1,000 mg by mouth every 6 (six) hours as needed for mild pain, fever, moderate pain or headache. (Patient not taking: Reported on 10/13/2022)   apixaban (ELIQUIS) 5 MG TABS tablet Take 1 tablet (5 mg total) by mouth 2 (two) times daily. (Patient not taking: Reported on 10/13/2022)   azithromycin (ZITHROMAX Z-PAK) 250 MG tablet Take 2 tablets day 1 and then 1 daily for 4 days (Patient not taking: Reported on 10/12/2022)   cetirizine (ZYRTEC) 10 MG tablet Take 2 tablets (20 mg total) by mouth 2 (two) times daily. (Patient not taking: Reported on 10/12/2022)   clobetasol ointment (TEMOVATE) 0.05 % Apply twice a day for the next 1-2 weeks   clotrimazole-betamethasone (LOTRISONE) cream Apply 1  application  topically in the morning and at bedtime.   EPINEPHrine 0.3 mg/0.3 mL IJ SOAJ injection 0.3 mg once as needed for anaphylaxis. (Patient not taking: Reported on 10/13/2022)   famotidine (PEPCID) 40 MG tablet Take 1 tablet (40 mg total) by mouth 2 (two) times daily.   HYDROmorphone (DILAUDID) 4 MG tablet Take 1 tablet (4 mg total) by mouth every 4 (four) hours as needed for severe pain.   lactose free nutrition (BOOST) LIQD Take 237 mLs by mouth 3 (three) times daily between meals.  (Patient not taking: Reported on 10/12/2022)   methenamine (HIPREX) 1 g tablet Take 1 tablet (1 g total) by mouth 2 (two) times daily with a meal. (Patient not taking: Reported on 10/12/2022)   metoprolol tartrate (LOPRESSOR) 25 MG tablet Take 1 tablet (25 mg total) by mouth 2 (two) times daily. (Patient not taking: Reported on 10/13/2022)   oxyCODONE (OXY IR/ROXICODONE) 5 MG immediate release tablet Take 1 tablet (5 mg total) by mouth every 4 (four) hours as needed for severe pain.   pantoprazole (PROTONIX) 40 MG tablet Take 1 tablet (40 mg total) by mouth daily.   polyethylene glycol (MIRALAX / GLYCOLAX) 17 g packet Take 17 g by mouth daily as needed for mild constipation.   predniSONE (DELTASONE) 10 MG tablet Take two tablets ('20mg'$ ) twice daily for three days, then one tablet ('10mg'$ ) twice daily for three days, then STOP.   triamcinolone ointment (KENALOG) 0.1 % Apply 1 Application topically 2 (two)  times daily. Do not put onto the face.   Facility-Administered Encounter Medications as of 10/15/2022  Medication   omalizumab Arvid Right) prefilled syringe 300 mg    Allergies as of 10/15/2022 - Review Complete 10/14/2022  Allergen Reaction Noted   Ciprofloxacin Hives 09/10/2014   Zofran [ondansetron] Rash 02/05/2021    Past Medical History:  Diagnosis Date   Bladder cancer ([place])    Bone cancer ([place])    BPH (benign prostatic hypertrophy)    Dysuria    GERD (gastroesophageal reflux disease)    History of  adenomatous polyp of colon    tubular adenoma 06/ 2018   History of bladder cancer 12/2014   urologist-  dr [name]--  dx High Grade TCC without stromal invasion s/p TURBT and intravesical BCG tx/  recurrent 07/2015 (pTa) TCC  repear intravesical BCG tx    History of hiatal hernia    History of urethral stricture    Nocturia     Past Surgical History:  Procedure Laterality Date   CARDIAC CATHETERIZATION  1997   normal coronary arteries   CARDIOVASCULAR STRESS TEST  12-14-2005   Low risk perfusion study/  very small scar in the inferior septum from mid ventricle to apex,  no ischemia/  mild inferior septal hypokinesis/  ef 58%   CATARACT EXTRACTION W/ INTRAOCULAR LENS  IMPLANT, BILATERAL  2011   COLONOSCOPY  last one 06/ 2018   CYSTOSCOPY WITH BIOPSY N/A 08/12/2015   Procedure: CYSTOSCOPY WITH BIOPSY AND FULGERATION;  Surgeon: [name], MD;  Location: [facility];  Service: Urology;  Laterality: N/A;   CYSTOSCOPY WITH INJECTION N/A 06/29/2017   Procedure: CYSTOSCOPY WITH INJECTION OF INDOCYANINE GREEN DYE;  Surgeon: Alexis Frock, MD;  Location: [facility];  Service: Urology;  Laterality: N/A;   CYSTOSCOPY WITH URETHRAL DILATATION N/A 03/21/2017   Procedure: CYSTOSCOPY;  Surgeon: [name], MD;  Location[facility];  Service: Urology;  Laterality: N/A;   ESOPHAGOGASTRODUODENOSCOPY  12-05-2014   IR IMAGING GUIDED PORT INSERTION  02/07/2018   IR NEPHROSTOMY PLACEMENT LEFT  12/25/2021   IR REMOVAL TUN ACCESS W/ PORT W/O FL MOD SED  04/14/2018   TRANSTHORACIC ECHOCARDIOGRAM  01-31-2008   nomral LVF,  ef 55-65%/  mild pulmonic stenosis without regurg.,  peak transpulomonic valve gradient 74mHg   TRANSURETHRAL RESECTION OF BLADDER TUMOR N/A 12/31/2014   Procedure: TRANSURETHRAL RESECTION OF BLADDER TUMOR (TURBT);  Surgeon: MLowella Bandy MD;  [facility]  Service: Urology;  Laterality: N/A;   TRANSURETHRAL RESECTION OF BLADDER  TUMOR N/A 03/21/2017   Procedure: POSSIBLE TRANSURETHRAL RESECTION OF BLADDER TUMOR (TURBT);  Surgeon: BNickie Retort MD;  [facility]  Service: Urology;  Laterality: N/A;    Family History  Problem Relation Age of Onset   Alcoholism Father    Colon cancer Neg Hx    Colon polyps Neg Hx    Diabetes Neg Hx    Kidney disease Neg Hx    Esophageal cancer Neg Hx    Heart disease Neg Hx    Gallbladder disease Neg Hx    Allergic rhinitis Neg Hx    Angioedema Neg Hx    Asthma Neg Hx    Atopy Neg Hx    Eczema Neg Hx    Immunodeficiency Neg Hx    Urticaria Neg Hx     Social History   Socioeconomic History   Marital status: Married    Spouse name: Not on file  Number of children: 1   Years of education: Not on file   Highest education level: Not on file  Occupational History   Occupation: Retired  Tobacco Use   Smoking status: Former    Packs/day: 1.00    Years: 34.00    Total pack years: 34.00    Types: Cigarettes    Quit date: 12/26/1988    Years since quitting: 33.8   Smokeless tobacco: Never  Vaping Use   Vaping Use: Never used  Substance and Sexual Activity   Alcohol use: Yes    Alcohol/week: 6.0 standard drinks of alcohol    Types: 6 Cans of beer per week    Comment: BEER   Drug use: No   Sexual activity: Not Currently  Other Topics Concern   Not on file  Social History Narrative   Not on file   Social Determinants of Health   Financial Resource Strain: Not on file  Food Insecurity: No Food Insecurity (08/17/2022)   Hunger Vital Sign    Worried About Running Out of Food in the Last Year: Never true    Ran Out of Food in the Last Year: Never true  Transportation Needs: No Transportation Needs (08/17/2022)   PRAPARE - Hydrologist (Medical): No    Lack of Transportation (Non-Medical): No  Physical Activity: Not on file  Stress: Not on file  Social Connections: Not on file  Intimate Partner Violence:  Not At Risk (08/17/2022)   Humiliation, Afraid, Rape, and Kick questionnaire    Fear of Current or Ex-Partner: No    Emotionally Abused: No    Physically Abused: No    Sexually Abused: No    Review of Systems  Respiratory:  Positive for cough and shortness of breath.    There were no vitals filed for this visit.  Physical Exam Constitutional:      Appearance: Normal appearance.  HENT:     Head: Normocephalic.     Mouth/Throat:     Mouth: Mucous membranes are moist.  Eyes:     General: No scleral icterus. Cardiovascular:     Rate and Rhythm: Normal rate and regular rhythm.     Heart sounds: No murmur heard.    No friction rub.  Pulmonary:     Effort: No respiratory distress.     Breath sounds: No stridor. No wheezing or rhonchi.  Abdominal:     General: Abdomen is flat.  Musculoskeletal:     Cervical back: No rigidity or tenderness.  Neurological:     Mental Status: He is alert.  Psychiatric:        Mood and Affect: Mood normal.    Data Reviewed: Previous chest x-ray reviewed  Most recent PET scan reviewed showing right pleural effusion, adenopathy  Assessment:  Recurrent right and right pleural effusion -Cytology negative, exudative effusion  History of bladder cancer, follows up with oncology  Recurrent sustained cough, -Trial with an inhaler is appropriate  Will schedule for pulmonary function test as well to assess pulmonary volumes  Subcarinal lymph node with an SUV of 5.1-being followed by oncology -Does have a PET scan scheduled for June  Plan/Recommendations: X-ray not showing significant effusion at present  Encourage graded activity as tolerated  Obtain pulmonary function test  Prescription for Trelegy sent to pharmacy to be used daily  Follow-up in about 6 weeks  Encouraged to call with any significant concerns   Sherrilyn Rist MD [facility] 10/15/2022, 9:52  AM  CC: [name], MD

## 2022-10-15 NOTE — Patient Instructions (Signed)
We will try an inhaler for your chronic cough -It is to be used on a daily basis -Make sure you rinse your mouth after each use  We will schedule you for breathing study to be done in about 6 weeks, at the time of next visit  Schedule for 6-week follow-up  Graded exercises as tolerated Start walking on a regular basis -This is the only way to get you feeling better and functioning better

## 2022-10-20 DIAGNOSIS — H04123 Dry eye syndrome of bilateral lacrimal glands: Secondary | ICD-10-CM | POA: Diagnosis not present

## 2022-10-25 ENCOUNTER — Ambulatory Visit (INDEPENDENT_AMBULATORY_CARE_PROVIDER_SITE_OTHER): Payer: Medicare Other

## 2022-10-25 DIAGNOSIS — L501 Idiopathic urticaria: Secondary | ICD-10-CM | POA: Diagnosis not present

## 2022-11-08 ENCOUNTER — Ambulatory Visit (INDEPENDENT_AMBULATORY_CARE_PROVIDER_SITE_OTHER): Payer: Medicare Other

## 2022-11-08 DIAGNOSIS — L501 Idiopathic urticaria: Secondary | ICD-10-CM | POA: Diagnosis not present

## 2022-11-11 ENCOUNTER — Telehealth: Payer: Self-pay

## 2022-11-11 NOTE — Telephone Encounter (Signed)
Patients family called asking if the PET CT can be done sooner due to his previous doctor, Dr. Alen Blew, doing the PET every three months.  He is also having increased hip pain that is managed with tylenol.  He is also having PRN right leg and foot swelling for the past two weeks. .  The swelling is better in the mornings but increases during the day.  Denied hot spots/knots with lower legs and calves.  Instructed the family to keep the legs elevated as much as they can during the day and Dr. Delton Coombes would be notified of patients request for the PET.  They have a doctors appointment with Dr. Joyce Gross on Monday morning at 1130 for the lower leg swelling.  Their main concern for the CC is moving the PET CT sooner.

## 2022-11-15 ENCOUNTER — Other Ambulatory Visit (HOSPITAL_COMMUNITY): Payer: Self-pay | Admitting: Internal Medicine

## 2022-11-15 ENCOUNTER — Ambulatory Visit (HOSPITAL_COMMUNITY)
Admission: RE | Admit: 2022-11-15 | Discharge: 2022-11-15 | Disposition: A | Discharge status: Home / Self Care | Payer: Medicare Other | Source: Ambulatory Visit | Attending: Internal Medicine | Admitting: Internal Medicine

## 2022-11-15 ENCOUNTER — Telehealth: Payer: Self-pay | Admitting: *Deleted

## 2022-11-15 DIAGNOSIS — R0600 Dyspnea, unspecified: Secondary | ICD-10-CM

## 2022-11-15 DIAGNOSIS — Z6826 Body mass index (BMI) 26.0-26.9, adult: Secondary | ICD-10-CM | POA: Diagnosis not present

## 2022-11-15 DIAGNOSIS — J90 Pleural effusion, not elsewhere classified: Secondary | ICD-10-CM | POA: Diagnosis not present

## 2022-11-15 DIAGNOSIS — C7951 Secondary malignant neoplasm of bone: Secondary | ICD-10-CM | POA: Diagnosis not present

## 2022-11-15 DIAGNOSIS — M25551 Pain in right hip: Secondary | ICD-10-CM | POA: Diagnosis not present

## 2022-11-15 DIAGNOSIS — R0602 Shortness of breath: Secondary | ICD-10-CM | POA: Diagnosis not present

## 2022-11-15 DIAGNOSIS — J449 Chronic obstructive pulmonary disease, unspecified: Secondary | ICD-10-CM | POA: Diagnosis not present

## 2022-11-15 DIAGNOSIS — C771 Secondary and unspecified malignant neoplasm of intrathoracic lymph nodes: Secondary | ICD-10-CM | POA: Diagnosis not present

## 2022-11-15 DIAGNOSIS — I700 Atherosclerosis of aorta: Secondary | ICD-10-CM | POA: Diagnosis not present

## 2022-11-15 DIAGNOSIS — R059 Cough, unspecified: Secondary | ICD-10-CM | POA: Diagnosis not present

## 2022-11-15 DIAGNOSIS — R5383 Other fatigue: Secondary | ICD-10-CM | POA: Diagnosis not present

## 2022-11-15 DIAGNOSIS — Z936 Other artificial openings of urinary tract status: Secondary | ICD-10-CM | POA: Diagnosis not present

## 2022-11-15 DIAGNOSIS — C679 Malignant neoplasm of bladder, unspecified: Secondary | ICD-10-CM | POA: Diagnosis not present

## 2022-11-15 DIAGNOSIS — C7800 Secondary malignant neoplasm of unspecified lung: Secondary | ICD-10-CM | POA: Diagnosis not present

## 2022-11-15 DIAGNOSIS — J9811 Atelectasis: Secondary | ICD-10-CM | POA: Diagnosis not present

## 2022-11-15 DIAGNOSIS — I10 Essential (primary) hypertension: Secondary | ICD-10-CM | POA: Diagnosis not present

## 2022-11-15 NOTE — Telephone Encounter (Signed)
Patient and wife presented to office following a visit with Dr. Gerarda Fraction for pain in right hip and chest.  Dr. Gerarda Fraction sent patient for CXR and X-Ray of Right hip.  Exam has not been read.  Patient states that pain is new and he is not eating well.  Requested that PET scan be moved up, as well as office visit with Dr. Delton Coombes.  Made them aware that I will bring this to his attention and will follow up with them tomorrow with recommendations, once Dr. Delton Coombes reviews films from today.  Verbalized understanding.

## 2022-11-16 ENCOUNTER — Encounter: Payer: Self-pay | Admitting: Oncology

## 2022-11-16 NOTE — Telephone Encounter (Signed)
Patient presented to office yesterday.  See note from triage nurse.  Patient scheduled for PET.

## 2022-11-24 ENCOUNTER — Ambulatory Visit (INDEPENDENT_AMBULATORY_CARE_PROVIDER_SITE_OTHER): Payer: Medicare Other

## 2022-11-24 DIAGNOSIS — E538 Deficiency of other specified B group vitamins: Secondary | ICD-10-CM | POA: Diagnosis not present

## 2022-11-24 DIAGNOSIS — L501 Idiopathic urticaria: Secondary | ICD-10-CM | POA: Diagnosis not present

## 2022-11-25 ENCOUNTER — Encounter (HOSPITAL_COMMUNITY)
Admission: RE | Admit: 2022-11-25 | Discharge: 2022-11-25 | Disposition: A | Discharge status: Home / Self Care | Payer: Medicare Other | Source: Ambulatory Visit | Attending: Hematology | Admitting: Hematology

## 2022-11-25 DIAGNOSIS — C679 Malignant neoplasm of bladder, unspecified: Secondary | ICD-10-CM | POA: Insufficient documentation

## 2022-11-25 MED ORDER — FLUDEOXYGLUCOSE F - 18 (FDG) INJECTION
10.3500 | Freq: Once | INTRAVENOUS | Status: AC | PRN
  Administered 2022-11-25: 10.35 via INTRAVENOUS

## 2022-11-28 NOTE — Progress Notes (Signed)
[name] [address] [place] [address]    Clinic Day:  12/07/2022  Referring physician: Elfredia Nevins, MD  Patient Care Team: Elfredia Nevins, MD as PCP - General (Internal Medicine) Marinus Maw, MD as PCP - Electrophysiology (Cardiology) Doreatha Massed, MD as Medical Oncologist (Medical Oncology) Therese Sarah, RN as Oncology Nurse Navigator (Medical Oncology)   ASSESSMENT & PLAN:   Assessment: 1.  Metastatic urothelial carcinoma of the bladder to the lungs and bones: - Neoadjuvant chemotherapy with gemcitabine and cisplatin cycle 1 on 05/02/2017 - Cystoprostatectomy, bilateral lymphadenectomy by Dr. Berneice Heinrich on 06/29/2017, pathology T3b N0, 0/13 lymph nodes - Developed metastatic disease in April 2019, biopsy of the right ischium on 12/08/2017 with poorly differentiated carcinoma consistent with metastatic urothelial carcinoma. - XRT to the pelvis completed on 01/16/2018 - Carboplatin and gemcitabine 6 cycles from 01/30/2018 through 05/22/2018 - Pembrolizumab every 3 weeks started on 06/21/2018, discontinued in April 2020 due to skin toxicity - XRT to the right paratracheal lymph node, 50 Gray in 10 fractions completed on 04/23/2020 - XRT to the intrathoracic lymph node completed on 11/27/2021   2.  Social/family history: - Lives at home with his wife. - Worked for [employer].  Quit smoking 40 years ago. - No family history of malignancies.    Plan: 1.  Metastatic urothelial carcinoma of the bladder to the lungs and bones: - PET scan (11/25/2022): No evidence of local recurrence of bladder cancer or metastatic disease.  Previously seen mildly hypermetabolic subcarinal lymph node decreased.  New patchy groundglass opacities medially in both upper lobes likely inflammatory.  Enlarging bilateral pleural effusions with increased dependent atelectasis in both lungs.  Stable treated lytic lesion in the right inferior pubic ramus. - I have  recommended right thoracentesis.  We will send fluid for cytology, LDH, cell count and protein.   2.  Weight loss: - He lost 10 to 15 pounds in the last 6 months due to decreased appetite. - If he continues to lose weight, consider appetite stimulant.  Orders Placed This Encounter  Procedures   US THORACENTESIS ASP PLEURAL SPACE W/IMG GUIDE    Standing Status:   Future    Number of Occurrences:   1    Standing Expiration Date:   11/29/2023    Order Specific Question:   Are labs required for specimen collection?    Answer:   Yes    Order Specific Question:   Lab orders requested (DO NOT place separate lab orders, these will be automatically ordered during procedure specimen collection):    Answer:   Body Fluid Cell Count w/ Differential    Order Specific Question:   Lab orders requested (DO NOT place separate lab orders, these will be automatically ordered during procedure specimen collection):    Answer:   Glucose-Pleural Or Peritoneal Fluid    Order Specific Question:   Lab orders requested (DO NOT place separate lab orders, these will be automatically ordered during procedure specimen collection):    Answer:   Protein-Pleural Or Peritoneal Fluid    Order Specific Question:   Lab orders requested (DO NOT place separate lab orders, these will be automatically ordered during procedure specimen collection):    Answer:   Lactate Dehydrogenase-Body Fluid    Order Specific Question:   Lab orders requested (DO NOT place separate lab orders, these will be automatically ordered during procedure specimen collection):    Answer:   Oncology    Order Specific Question:  Oncology specific labs:    Answer:   Cytology - Non Pap    Order Specific Question:   Reason for Exam (SYMPTOM  OR DIAGNOSIS REQUIRED)    Answer:   pleural effusions    Order Specific Question:   Preferred imaging location?    Answer:   [facility]    Order Specific Question:   Release to patient    Answer:   Immediate    Ambulatory Referral to [facility]    Referral Priority:   Routine    Referral Type:   Consultation    Referral Reason:   Specialty Services Required    Number of Visits Requested:   1      I,Katie Daubenspeck,acting as a scribe for Doreatha Massed, MD.,have documented all relevant documentation on the behalf of Doreatha Massed, MD,as directed by  Doreatha Massed, MD while in the presence of Doreatha Massed, MD.   I, Doreatha Massed MD, have reviewed the above documentation for accuracy and completeness, and I agree with the above.   Doreatha Massed, MD   4/23/20246:05 PM  CHIEF COMPLAINT:   Diagnosis: Metastatic urothelial carcinoma of the bladder    Cancer Staging  No matching staging information was found for the patient.   Prior Therapy: 1. Neoadjuvant gemcitabine/cisplatin on 05/02/17 (one cycle) 2. Cystoprostatectomy and bilateral lymphadenectomy on 06/29/2017 (Dr. Berneice Heinrich) 3. Radiation therapy to the pelvic lesion 01/02/18 - 01/16/18 4. Carboplatin and gemcitabine, 6 cycles 02/06/18 - 05/22/18 5. Pembrolizumab 06/21/18 - 12/13/18 6. Radiation therapy to RUL and right paratracheal nodes 04/09/20 - 04/23/20 7. Radiation therapy to intrathoracic lymph nodes 11/16/21 - 11/27/21  Current Therapy:  surveillance   HISTORY OF PRESENT ILLNESS:   Oncology History  Stage IV malignant neoplasm of urinary bladder /with mets to the bones and lungs  04/20/2017 Initial Diagnosis   Malignant neoplasm of urinary bladder (HCC)   01/30/2018 - 05/22/2018 Chemotherapy   The patient had palonosetron (ALOXI) injection 0.25 mg, 0.25 mg, Intravenous,  Once, 6 of 6 cycles Administration: 0.25 mg (01/30/2018), 0.25 mg (02/20/2018), 0.25 mg (03/13/2018), 0.25 mg (04/03/2018), 0.25 mg (05/01/2018), 0.25 mg (05/22/2018) CARBOplatin (PARAPLATIN) 400 mg in sodium chloride 0.9 % 250 mL chemo infusion, 400 mg (100 % of original dose 400 mg), Intravenous,  Once, 6 of 6 cycles Dose modification: 400  mg (original dose 400 mg, Cycle 1),   (original dose 400 mg, Cycle 2, Reason: Patient Age), 410 mg (original dose 400 mg, Cycle 6) Administration: 400 mg (01/30/2018), 400 mg (02/20/2018), 400 mg (03/13/2018), 410 mg (04/03/2018), 410 mg (05/01/2018), 410 mg (05/22/2018) gemcitabine (GEMZAR) 2,128 mg in sodium chloride 0.9 % 250 mL chemo infusion, 1,000 mg/m2 = 2,128 mg, Intravenous,  Once, 6 of 6 cycles Dose modification: 800 mg/m2 (original dose 1,000 mg/m2, Cycle 4, Reason: Provider Judgment), 800 mg/m2 (original dose 1,000 mg/m2, Cycle 4, Reason: Provider Judgment) Administration: 2,128 mg (01/30/2018), 2,128 mg (02/06/2018), 2,128 mg (02/20/2018), 2,128 mg (02/27/2018), 2,128 mg (03/13/2018), 1,710 mg (04/03/2018), 1,710 mg (04/10/2018), 1,710 mg (05/01/2018), 1,710 mg (05/22/2018) fosaprepitant (EMEND) 150 mg, dexamethasone (DECADRON) 12 mg in sodium chloride 0.9 % 145 mL IVPB, , Intravenous,  Once, 6 of 6 cycles Administration:  (01/30/2018),  (02/20/2018),  (03/13/2018),  (04/03/2018),  (05/01/2018),  (05/22/2018)  for chemotherapy treatment.    Bladder cancer  06/28/2017 Initial Diagnosis   Bladder cancer (HCC)   06/21/2018 - 12/13/2018 Chemotherapy   The patient had pembrolizumab (KEYTRUDA) 200 mg in sodium chloride 0.9 % 50 mL chemo  infusion, 200 mg, Intravenous, Once, 8 of 9 cycles Administration: 200 mg (06/21/2018), 200 mg (07/12/2018), 200 mg (08/02/2018), 200 mg (08/23/2018), 200 mg (09/13/2018), 200 mg (10/04/2018), 200 mg (11/22/2018), 200 mg (12/13/2018)  for chemotherapy treatment.       INTERVAL HISTORY:   Hadley is a 82 y.o. male presenting to clinic today for follow up of metastatic urothelial carcinoma of the bladder. He was last seen by me on 08/17/22 in consultation.  Since his last visit, he underwent restaging PET scan on 11/25/22.  Today, he states that he is doing well overall. His appetite level is at 10%. His energy level is at 10%.  PAST MEDICAL HISTORY:   Past Medical History: Past  Medical History:  Diagnosis Date   Bladder cancer    Bone cancer    BPH (benign prostatic hypertrophy)    Dysuria    GERD (gastroesophageal reflux disease)    History of adenomatous polyp of colon    tubular adenoma 06/ 2018   History of bladder cancer 12/2014   urologist-  dr Sherryl Barters--  dx High Grade TCC without stromal invasion s/p TURBT and intravesical BCG tx/  recurrent 07/2015 (pTa) TCC  repear intravesical BCG tx    History of hiatal hernia    History of urethral stricture    Nocturia     Surgical History: Past Surgical History:  Procedure Laterality Date   CARDIAC CATHETERIZATION  1997   normal coronary arteries   CARDIOVASCULAR STRESS TEST  12-14-2005   Low risk perfusion study/  very small scar in the inferior septum from mid ventricle to apex,  no ischemia/  mild inferior septal hypokinesis/  ef 58%   CATARACT EXTRACTION W/ INTRAOCULAR LENS  IMPLANT, BILATERAL  2011   COLONOSCOPY  last one 06/ 2018   CYSTOSCOPY WITH BIOPSY N/A 08/12/2015   Procedure: CYSTOSCOPY WITH BIOPSY AND FULGERATION;  Surgeon: Hildred Laser, MD;  Location: [facility];  Service: Urology;  Laterality: N/A;   CYSTOSCOPY WITH INJECTION N/A 06/29/2017   Procedure: CYSTOSCOPY WITH INJECTION OF INDOCYANINE GREEN DYE;  Surgeon: Sebastian Ache, MD;  Location: [facility];  Service: Urology;  Laterality: N/A;   CYSTOSCOPY WITH URETHRAL DILATATION N/A 03/21/2017   Procedure: CYSTOSCOPY;  Surgeon: Hildred Laser, MD;  Location: [facility];  Service: Urology;  Laterality: N/A;   ESOPHAGOGASTRODUODENOSCOPY  12-05-2014   IR IMAGING GUIDED PORT INSERTION  02/07/2018   IR NEPHROSTOMY PLACEMENT LEFT  12/25/2021   IR REMOVAL TUN ACCESS W/ PORT W/O FL MOD SED  04/14/2018   TRANSTHORACIC ECHOCARDIOGRAM  01-31-2008   nomral LVF,  ef 55-65%/  mild pulmonic stenosis without regurg.,  peak transpulomonic valve gradient   TRANSURETHRAL RESECTION OF BLADDER TUMOR N/A 12/31/2014    Procedure: TRANSURETHRAL RESECTION OF BLADDER TUMOR (TURBT);  Surgeon: Su Grand, MD;  Location: [facility];  Service: Urology;  Laterality: N/A;   TRANSURETHRAL RESECTION OF BLADDER TUMOR N/A 03/21/2017   Procedure: POSSIBLE TRANSURETHRAL RESECTION OF BLADDER TUMOR (TURBT);  Surgeon: Hildred Laser, MD;  Location: [facility];  Service: Urology;  Laterality: N/A;    Social History: Social History   Socioeconomic History   Marital status: Married    Spouse name: Not on file   Number of children: 1   Years of education: Not on file   Highest education level: Not on file  Occupational History   Occupation: Retired  Tobacco Use   Smoking status: Former  Packs/day: 1.00    Years: 34.00    Additional pack years: 0.00    Total pack years: 34.00    Types: Cigarettes    Quit date: 12/26/1988    Years since quitting: 33.9   Smokeless tobacco: Never  Vaping Use   Vaping Use: Never used  Substance and Sexual Activity   Alcohol use: Yes    Alcohol/week: 6.0 standard drinks of alcohol    Types: 6 Cans of beer per week    Comment: BEER   Drug use: No   Sexual activity: Not Currently  Other Topics Concern   Not on file  Social History Narrative   Not on file   Social Determinants of Health   Financial Resource Strain: Not on file  Food Insecurity: No Food Insecurity (08/17/2022)   Hunger Vital Sign    Worried About Running Out of Food in the Last Year: Never true    Ran Out of Food in the Last Year: Never true  Transportation Needs: No Transportation Needs (08/17/2022)   PRAPARE - Administrator, Civil Service (Medical): No    Lack of Transportation (Non-Medical): No  Physical Activity: Not on file  Stress: Not on file  Social Connections: Not on file  Intimate Partner Violence: Not At Risk (08/17/2022)   Humiliation, Afraid, Rape, and Kick questionnaire    Fear of Current or Ex-Partner: No    Emotionally Abused: No    Physically  Abused: No    Sexually Abused: No    Family History: Family History  Problem Relation Age of Onset   Alcoholism Father    Colon cancer Neg Hx    Colon polyps Neg Hx    Diabetes Neg Hx    Kidney disease Neg Hx    Esophageal cancer Neg Hx    Heart disease Neg Hx    Gallbladder disease Neg Hx    Allergic rhinitis Neg Hx    Angioedema Neg Hx    Asthma Neg Hx    Atopy Neg Hx    Eczema Neg Hx    Immunodeficiency Neg Hx    Urticaria Neg Hx     Current Medications:  Current Outpatient Medications:    acetaminophen (TYLENOL) 500 MG tablet, Take 1,000 mg by mouth every 6 (six) hours as needed for mild pain, fever, moderate pain or headache., Disp: , Rfl:    apixaban (ELIQUIS) 5 MG TABS tablet, Take 1 tablet (5 mg total) by mouth 2 (two) times daily., Disp: 60 tablet, Rfl: 2   methenamine (HIPREX) 1 g tablet, Take 1 tablet (1 g total) by mouth 2 (two) times daily with a meal., Disp: 60 tablet, Rfl: 11   metoprolol tartrate (LOPRESSOR) 25 MG tablet, Take 1 tablet (25 mg total) by mouth 2 (two) times daily., Disp: 60 tablet, Rfl: 2   pantoprazole (PROTONIX) 40 MG tablet, Take 1 tablet (40 mg total) by mouth daily., Disp: 30 tablet, Rfl: 1   oxyCODONE (OXY IR/ROXICODONE) 5 MG immediate release tablet, Take 1 tablet (5 mg total) by mouth every 4 (four) hours as needed for severe pain., Disp: 180 tablet, Rfl: 0  Current Facility-Administered Medications:    omalizumab Geoffry Paradise) prefilled syringe 300 mg, 300 mg, Subcutaneous, Q14 Days, Alfonse Spruce, MD, 300 mg at 11/24/22 6962   Allergies: Allergies  Allergen Reactions   Ciprofloxacin Hives   Zofran [Ondansetron] Rash    REVIEW OF SYSTEMS:   Review of Systems  Constitutional:  Positive for fatigue. Negative for  chills and fever.  HENT:   Negative for lump/mass, mouth sores, nosebleeds, sore throat and trouble swallowing.   Eyes:  Negative for eye problems.  Respiratory:  Positive for cough and shortness of breath.    Cardiovascular:  Positive for leg swelling. Negative for chest pain and palpitations.  Gastrointestinal:  Negative for abdominal pain, constipation, diarrhea, nausea and vomiting.  Genitourinary:  Negative for bladder incontinence, difficulty urinating, dysuria, frequency, hematuria and nocturia.   Musculoskeletal:  Negative for arthralgias, back pain, flank pain, myalgias and neck pain.  Skin:  Negative for itching and rash.  Neurological:  Negative for dizziness, headaches and numbness.  Hematological:  Does not bruise/bleed easily.  Psychiatric/Behavioral:  Positive for sleep disturbance. Negative for depression and suicidal ideas. The patient is not nervous/anxious.   All other systems reviewed and are negative.    VITALS:   Blood pressure 127/84, pulse 79, temperature (!) 97.5 F (36.4 C), temperature source Tympanic, resp. rate 20, weight 183 lb 11.2 oz (83.3 kg), SpO2 96 %.  Wt Readings from Last 3 Encounters:  11/29/22 183 lb 11.2 oz (83.3 kg)  10/15/22 185 lb (83.9 kg)  10/13/22 185 lb 12.8 oz (84.3 kg)    Body mass index is 25.62 kg/m.  Performance status (ECOG): 1 - Symptomatic but completely ambulatory  PHYSICAL EXAM:   Physical Exam Vitals and nursing note reviewed. Exam conducted with a chaperone present.  Constitutional:      Appearance: Normal appearance.  Cardiovascular:     Rate and Rhythm: Normal rate and regular rhythm.     Pulses: Normal pulses.     Heart sounds: Normal heart sounds.  Pulmonary:     Effort: Pulmonary effort is normal.     Breath sounds: Normal breath sounds.  Abdominal:     Palpations: Abdomen is soft. There is no hepatomegaly, splenomegaly or mass.     Tenderness: There is no abdominal tenderness.  Musculoskeletal:     Right lower leg: Edema present.     Left lower leg: Edema present.  Lymphadenopathy:     Cervical: No cervical adenopathy.     Right cervical: No superficial, deep or posterior cervical adenopathy.    Left  cervical: No superficial, deep or posterior cervical adenopathy.     Upper Body:     Right upper body: No supraclavicular or axillary adenopathy.     Left upper body: No supraclavicular or axillary adenopathy.  Neurological:     General: No focal deficit present.     Mental Status: He is alert and oriented to person, place, and time.  Psychiatric:        Mood and Affect: Mood normal.        Behavior: Behavior normal.     LABS:      Latest Ref Rng & Units 07/12/2022    3:49 PM 07/02/2022    9:35 AM 06/22/2022    4:56 AM  CBC  WBC 4.0 - 10.5 K/uL 7.6  9.2  9.8   Hemoglobin 13.0 - 17.0 g/dL 40.9  81.1  91.4   Hematocrit 39.0 - 52.0 % 46.4  41.5  40.1   Platelets 150 - 400 K/uL 291  188  267       Latest Ref Rng & Units 07/12/2022    3:49 PM 07/02/2022    9:35 AM 06/22/2022    4:56 AM  CMP  Glucose 70 - 99 mg/dL 782  956  97   BUN 8 - 23 mg/dL 18  18  22   Creatinine 0.61 - 1.24 mg/dL 4.09  8.11  9.14   Sodium 135 - 145 mmol/L 141  141  142   Potassium 3.5 - 5.1 mmol/L 4.1  3.9  4.0   Chloride 98 - 111 mmol/L 106  109  104   CO2 22 - 32 mmol/L 23  28  27    Calcium 8.9 - 10.3 mg/dL 9.2  9.0  9.1   Total Protein 6.5 - 8.1 g/dL  6.0    Total Bilirubin 0.3 - 1.2 mg/dL  0.5    Alkaline Phos 38 - 126 U/L  56    AST 15 - 41 U/L  13    ALT 0 - 44 U/L  15       No results found for: "CEA1", "CEA" / No results found for: "CEA1", "CEA" No results found for: "PSA1" No results found for: "NWG956" No results found for: "CAN125"  No results found for: "TOTALPROTELP", "ALBUMINELP", "A1GS", "A2GS", "BETS", "BETA2SER", "GAMS", "MSPIKE", "SPEI" Lab Results  Component Value Date   TIBC 249 03/20/2018   FERRITIN 759 (H) 03/20/2018   IRONPCTSAT 20 (L) 03/20/2018   Lab Results  Component Value Date   LDH 96 (L) 06/21/2022     STUDIES:   US THORACENTESIS ASP PLEURAL SPACE W/IMG GUIDE  Result Date: 11/29/2022 INDICATION: Right pleural effusion.  Previous right thoracentesis last  November. EXAM: ULTRASOUND GUIDED RIGHT THORACENTESIS MEDICATIONS: None. COMPLICATIONS: None immediate. PROCEDURE: An ultrasound guided thoracentesis was thoroughly discussed with the patient and questions answered. The benefits, risks, alternatives and complications were also discussed. The patient understands and wishes to proceed with the procedure. Written consent was obtained. Ultrasound was performed to localize and mark an adequate pocket of fluid in the right chest. The area was then prepped and draped in the normal sterile fashion. 1% Lidocaine was used for local anesthesia. Under ultrasound guidance a 6 Fr Safe-T-Centesis catheter was introduced. Thoracentesis was performed. The catheter was removed and a dressing applied. FINDINGS: A total of approximately 1.5 L of serous pleural fluid was removed. Samples were sent to the laboratory as requested by the clinical team. IMPRESSION: Successful ultrasound guided right thoracentesis yielding 1.5 L of pleural fluid. Electronically Signed   By: Obie Dredge M.D.   On: 11/29/2022 13:33   DG Chest 1 View  Result Date: 11/29/2022 CLINICAL DATA:  Status post right thoracentesis. EXAM: CHEST  1 VIEW COMPARISON:  PET-CT dated November 25, 2022. Chest x-ray dated November 15, 2022. FINDINGS: Stable cardiomediastinal silhouette with normal heart size. Decreased now small right and unchanged small left pleural effusions. Mild right and minimal left basilar atelectasis. No pneumothorax. No acute osseous abnormality. IMPRESSION: 1. Decreased now small right and unchanged small left pleural effusions. No pneumothorax. Electronically Signed   By: Obie Dredge M.D.   On: 11/29/2022 13:32   NM PET Image Restag (PS) Skull Base To Thigh  Result Date: 11/29/2022 CLINICAL DATA:  Subsequent treatment strategy for bladder cancer. EXAM: NUCLEAR MEDICINE PET SKULL BASE TO THIGH TECHNIQUE: 10.35 mCi F-18 FDG was injected intravenously. Full-ring PET imaging was performed from  the skull base to thigh after the radiotracer. CT data was obtained and used for attenuation correction and anatomic localization. Fasting blood glucose: 103 mg/dl COMPARISON:  PET-CT 21/30/8657 and 03/15/2022. FINDINGS: Mediastinal blood pool activity: SUV max NECK: No hypermetabolic cervical lymph nodes are identified. No suspicious activity identified within the pharyngeal mucosal space. Incidental CT findings: Bilateral carotid atherosclerosis. CHEST: No significant residual hypermetabolic  mediastinal, hilar or axillary lymph nodes. Interval decreased activity within a previously demonstrated mildly hypermetabolic subcarinal lymph node (currently with an SUV max of 4.3, previously 5.1). Chronic nodule anteriorly in the left upper lobe is stable, measuring 8 mm on image 26/7, without hypermetabolic activity. There are new patchy ground-glass opacities medially in both upper lobes with associated low level metabolic activity (up to an SUV max of 4.1 on the right). These are likely inflammatory/infectious. No suspicious pulmonary nodularity. Incidental CT findings: Enlarging bilateral pleural effusions, moderate to large on the right and moderate on the left. Associated increased dependent atelectasis in both lungs. No residual focal abnormality identified anterior to the left hilum. Atherosclerosis of the aorta, great vessels and coronary arteries. ABDOMEN/PELVIS: There is no hypermetabolic activity within the liver, adrenal glands, spleen or pancreas. Low level activity in the adrenal glands bilaterally is unchanged and within physiologic limits. There is no hypermetabolic nodal activity in the abdomen or pelvis. Stable anorectal activity, also likely physiologic. Incidental CT findings: Stable hepatic cysts, small gallstones, aortic and branch vessel atherosclerosis and postsurgical changes related to prior cystectomy and urinary diversion. SKELETON: Stable treated expansile lytic lesion in the right inferior  pubic ramus and ischium without recurrent hypermetabolic activity. No new osseous lesions are identified to suggest metastatic disease. Incidental CT findings: Stable mild spondylosis and moderate bilateral gynecomastia. IMPRESSION: 1. No evidence of local recurrence of bladder cancer or metastatic disease. 2. A previously demonstrated mildly hypermetabolic subcarinal lymph node has decreased in metabolic activity. 3. New patchy ground-glass opacities medially in both upper lobes with associated low level metabolic activity, likely inflammatory/infectious. 4. Enlarging bilateral pleural effusions with increased dependent atelectasis in both lungs. 5. Stable treated lytic lesion in the right inferior pubic ramus and ischium. 6.  Aortic Atherosclerosis (ICD10-I70.0). Electronically Signed   By: Carey Bullocks M.D.   On: 11/29/2022 10:42   DG HIP UNILAT WITH PELVIS 2-3 VIEWS RIGHT  Result Date: 11/15/2022 CLINICAL DATA:  RIGHT hip pain for 4 years that is worsening, history of bladder cancer metastatic to bone EXAM: DG HIP (WITH OR WITHOUT PELVIS) 2-3V RIGHT COMPARISON:  PET-CT 07/28/2022 FINDINGS: Osseous demineralization. Narrowing of the hip joints bilaterally. Again identified destructive metastatic lesion at the inferior RIGHT pubic ramus. No additional fracture, dislocation, or bone destruction. IMPRESSION: Large destructive metastatic lesion at RIGHT inferior pubic ramus as noted on prior PET-CT. No focal abnormalities of the proximal RIGHT femur or hip joint. Electronically Signed   By: Ulyses Southward M.D.   On: 11/15/2022 17:13   DG Chest 2 View  Result Date: 11/15/2022 CLINICAL DATA:  Dyspnea, shortness of breath, cough for 1 year EXAM: CHEST - 2 VIEW COMPARISON:  10/15/2022 FINDINGS: Normal heart size and pulmonary vascularity. Fullness of LEFT hilum question slightly more prominent than on the previous exam. Persistent small bibasilar effusions and atelectasis. Persistent bronchitic changes. No new  infiltrate or pneumothorax. Bones demineralized. IMPRESSION: Persistent small bibasilar effusions and atelectasis. Persistent bronchitic changes. Fullness of LEFT pulmonary hilum appears slightly more pronounced than on previous exam, mass/adenopathy not entirely excluded; CT chest with contrast recommended for further evaluation. Electronically Signed   By: Ulyses Southward M.D.   On: 11/15/2022 17:10

## 2022-11-29 ENCOUNTER — Ambulatory Visit (HOSPITAL_COMMUNITY)
Admission: RE | Admit: 2022-11-29 | Discharge: 2022-11-29 | Disposition: A | Discharge status: Home / Self Care | Payer: Medicare Other | Source: Ambulatory Visit | Attending: Diagnostic Radiology | Admitting: Diagnostic Radiology

## 2022-11-29 ENCOUNTER — Ambulatory Visit (HOSPITAL_COMMUNITY)
Admission: RE | Admit: 2022-11-29 | Discharge: 2022-11-29 | Disposition: A | Discharge status: Home / Self Care | Payer: Medicare Other | Source: Ambulatory Visit | Attending: Hematology | Admitting: Hematology

## 2022-11-29 ENCOUNTER — Encounter: Payer: Self-pay | Admitting: Hematology

## 2022-11-29 ENCOUNTER — Encounter (HOSPITAL_COMMUNITY): Payer: Self-pay

## 2022-11-29 ENCOUNTER — Other Ambulatory Visit: Payer: Self-pay | Admitting: *Deleted

## 2022-11-29 ENCOUNTER — Inpatient Hospital Stay: Payer: Medicare Other | Attending: Oncology | Admitting: Hematology

## 2022-11-29 VITALS — BP 127/84 | HR 79 | Temp 97.5°F | Resp 20 | Wt 183.7 lb

## 2022-11-29 DIAGNOSIS — R634 Abnormal weight loss: Secondary | ICD-10-CM | POA: Diagnosis not present

## 2022-11-29 DIAGNOSIS — C679 Malignant neoplasm of bladder, unspecified: Secondary | ICD-10-CM | POA: Diagnosis not present

## 2022-11-29 DIAGNOSIS — J90 Pleural effusion, not elsewhere classified: Secondary | ICD-10-CM | POA: Diagnosis not present

## 2022-11-29 DIAGNOSIS — C7951 Secondary malignant neoplasm of bone: Secondary | ICD-10-CM | POA: Insufficient documentation

## 2022-11-29 DIAGNOSIS — C7800 Secondary malignant neoplasm of unspecified lung: Secondary | ICD-10-CM | POA: Insufficient documentation

## 2022-11-29 DIAGNOSIS — J9811 Atelectasis: Secondary | ICD-10-CM | POA: Diagnosis not present

## 2022-11-29 LAB — GLUCOSE, PLEURAL OR PERITONEAL FLUID: Glucose, Fluid: 101 mg/dL

## 2022-11-29 LAB — BODY FLUID CELL COUNT WITH DIFFERENTIAL
Lymphs, Fluid: 61 %
Monocyte-Macrophage-Serous Fluid: 36 % — ABNORMAL LOW (ref 50–90)
Neutrophil Count, Fluid: 3 % (ref 0–25)
Total Nucleated Cell Count, Fluid: 465 cu mm (ref 0–1000)

## 2022-11-29 LAB — GRAM STAIN

## 2022-11-29 LAB — LACTATE DEHYDROGENASE, PLEURAL OR PERITONEAL FLUID: LD, Fluid: 75 U/L — ABNORMAL HIGH (ref 3–23)

## 2022-11-29 LAB — PROTEIN, PLEURAL OR PERITONEAL FLUID: Total protein, fluid: 3.2 g/dL

## 2022-11-29 MED ORDER — LIDOCAINE HCL (PF) 2 % IJ SOLN
INTRAMUSCULAR | Status: AC
  Filled 2022-11-29: qty 10

## 2022-11-29 MED ORDER — LIDOCAINE HCL (PF) 2 % IJ SOLN
10.0000 mL | Freq: Once | INTRAMUSCULAR | Status: AC
  Administered 2022-11-29: 10 mL

## 2022-11-29 NOTE — Patient Instructions (Addendum)
[facility] at [facility] Discharge Instructions   You were seen and examined today by Dr. Ellin Saba.  He reviewed the results of your PET scan. There is no new evidence of cancer is seen on this exam. However, the fluid build up in your lungs has gotten worse since your prior scan.   We will arrange for you to have the fluid drained from your right lung.   Return as scheduled.      Thank you for choosing [facility] at [facility] to provide your oncology and hematology care.  To afford each patient quality time with our provider, please arrive at least 15 minutes before your scheduled appointment time.   If you have a lab appointment with the Cancer Center please come in thru the Main Entrance and check in at the main information desk.  You need to re-schedule your appointment should you arrive 10 or more minutes late.  We strive to give you quality time with our providers, and arriving late affects you and other patients whose appointments are after yours.  Also, if you no show three or more times for appointments you may be dismissed from the clinic at the providers discretion.     Again, thank you for choosing [facility].  Our hope is that these requests will decrease the amount of time that you wait before being seen by our physicians.       _____________________________________________________________  Should you have questions after your visit to [facility], please contact our office at [phone] and follow the prompts.  Our office hours are 8:00 a.m. and 4:30 p.m. Monday - Friday.  Please note that voicemails left after 4:00 p.m. may not be returned until the following business day.  We are closed weekends and major holidays.  You do have access to a nurse 24-7, just call the main number to the clinic [phone] and do not press any options, hold on the line and a nurse will answer the phone.    For  prescription refill requests, have your pharmacy contact our office and allow 72 hours.    Due to Covid, you will need to wear a mask upon entering the hospital. If you do not have a mask, a mask will be given to you at the Main Entrance upon arrival. For doctor visits, patients may have 1 support person age 62 or older with them. For treatment visits, patients can not have anyone with them due to social distancing guidelines and our immunocompromised population.

## 2022-11-29 NOTE — Progress Notes (Signed)
Patient tolerated right sided thoracentesis procedure well today and 1.5 Liters of clear yellow pleural fluid removed and sent to lab for processing. PT ambulatory to xray department at this time with no acute distress noted for post chest xray.

## 2022-11-30 ENCOUNTER — Encounter: Payer: Self-pay | Admitting: Oncology

## 2022-11-30 MED ORDER — OXYCODONE HCL 5 MG PO TABS: 5.0000 mg | ORAL_TABLET | ORAL | 0 refills | Status: DC | PRN

## 2022-12-01 ENCOUNTER — Ambulatory Visit (INDEPENDENT_AMBULATORY_CARE_PROVIDER_SITE_OTHER): Payer: Medicare Other | Admitting: Pulmonary Disease

## 2022-12-01 DIAGNOSIS — R0602 Shortness of breath: Secondary | ICD-10-CM

## 2022-12-01 LAB — PULMONARY FUNCTION TEST
DL/VA % pred: 88 %
DL/VA: 3.43 ml/min/mmHg/L
DLCO cor % pred: 61 %
DLCO cor: 15.26 ml/min/mmHg
DLCO unc % pred: 61 %
DLCO unc: 15.26 ml/min/mmHg
FEF 25-75 Post: 0.58 L/sec
FEF 25-75 Pre: 0.67 L/sec
FEF2575-%Change-Post: -14 %
FEF2575-%Pred-Post: 28 %
FEF2575-%Pred-Pre: 33 %
FEV1-%Change-Post: -7 %
FEV1-%Pred-Post: 37 %
FEV1-%Pred-Pre: 41 %
FEV1-Post: 1.12 L
FEV1-Pre: 1.21 L
FEV1FVC-%Change-Post: -16 %
FEV1FVC-%Pred-Pre: 79 %
FEV6-%Change-Post: 11 %
FEV6-%Pred-Post: 59 %
FEV6-%Pred-Pre: 52 %
FEV6-Post: 2.29 L
FEV6-Pre: 2.06 L
FEV6FVC-%Change-Post: -1 %
FEV6FVC-%Pred-Post: 103 %
FEV6FVC-%Pred-Pre: 104 %
FVC-%Change-Post: 10 %
FVC-%Pred-Post: 56 %
FVC-%Pred-Pre: 51 %
FVC-Post: 2.37 L
FVC-Pre: 2.13 L
Post FEV1/FVC ratio: 47 %
Post FEV6/FVC ratio: 97 %
Pre FEV1/FVC ratio: 57 %
Pre FEV6/FVC Ratio: 98 %
RV % pred: 131 %
RV: 3.6 L
TLC % pred: 70 %
TLC: 5.12 L

## 2022-12-01 LAB — CYTOLOGY - NON PAP

## 2022-12-01 NOTE — Patient Instructions (Signed)
Full PFT performed today. °

## 2022-12-01 NOTE — Progress Notes (Signed)
Full PFT performed today. °

## 2022-12-02 LAB — CULTURE, BODY FLUID W GRAM STAIN -BOTTLE

## 2022-12-03 LAB — CULTURE, BODY FLUID W GRAM STAIN -BOTTLE

## 2022-12-04 LAB — CULTURE, BODY FLUID W GRAM STAIN -BOTTLE: Culture: NO GROWTH

## 2022-12-06 ENCOUNTER — Ambulatory Visit: Payer: Medicare Other

## 2022-12-06 ENCOUNTER — Telehealth: Payer: Self-pay

## 2022-12-06 NOTE — Telephone Encounter (Signed)
Sounds good. Worth a shot.   Malachi Bonds, MD [facility]

## 2022-12-06 NOTE — Telephone Encounter (Signed)
Patient came in today to cancel his Xolair appointment. 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 He wants to hold off for now but doesn't want to sign discontinuation forms.

## 2022-12-07 ENCOUNTER — Encounter: Payer: Self-pay | Admitting: Oncology

## 2022-12-07 NOTE — Progress Notes (Signed)
[facility] [address] [place] [address]    Clinic Day:  12/08/2022  Referring physician: Elfredia Nevins, MD  Patient Care Team: Elfredia Nevins, MD as PCP - General (Internal Medicine) Marinus Maw, MD as PCP - Electrophysiology (Cardiology) Doreatha Massed, MD as Medical Oncologist (Medical Oncology) Therese Sarah, RN as Oncology Nurse Navigator (Medical Oncology)   ASSESSMENT & PLAN:   Assessment: 1.  Metastatic urothelial carcinoma of the bladder to the lungs and bones: - Neoadjuvant chemotherapy with gemcitabine and cisplatin cycle 1 on 05/02/2017 - Cystoprostatectomy, bilateral lymphadenectomy by Dr. Berneice Heinrich on 06/29/2017, pathology T3b N0, 0/13 lymph nodes - Developed metastatic disease in April 2019, biopsy of the right ischium on 12/08/2017 with poorly differentiated carcinoma consistent with metastatic urothelial carcinoma. - XRT to the pelvis completed on 01/16/2018 - Carboplatin and gemcitabine 6 cycles from 01/30/2018 through 05/22/2018 - Pembrolizumab every 3 weeks started on 06/21/2018, discontinued in April 2020 due to skin toxicity - XRT to the right paratracheal lymph node, 50 Gray in 10 fractions completed on 04/23/2020 - XRT to the intrathoracic lymph node completed on 11/27/2021   2.  Social/family history: - Lives at home with his wife. - Worked for [employer].  Quit smoking 40 years ago. - No family history of malignancies.    Plan: 1.  Metastatic urothelial carcinoma of the bladder to the lungs and bones: - PET scan on 11/25/2022: Subcarinal lymph node metabolism decreased.  New patchy groundglass opacities medially in both upper lobes.  Enlarging bilateral pleural effusions. - He had right thoracentesis on 11/29/2023 with 1.5 L removed. - Cytology is negative. - He reports decreased energy levels. - I will give him a trial of antibiotic Augmentin for new infiltrates on the PET scan and dry cough.  If there is no  improvement, we might have to consider them as malignant.  At that point we will consider reintroducing Keytruda as his functional status is declining without any other reason.  I will reevaluate him in 2 weeks.   2.  Weight loss: - He has lost 10 to 15 pounds in the last 6 months due to decreased appetite.  Will consider appetite stimulant if continues to lose.  3.  Dry cough: - Mostly dry cough with occasional white sputum.  Cough is mostly during daytime.  He is taking Delsym twice daily.  Not completely helping. - Will give Tessalon Perles.  Continue follow-up with pulmonology on Friday.  He had PFTs done on 12/01/2022.    No orders of the defined types were placed in this encounter.     I,Katie Daubenspeck,acting as a Neurosurgeon for Doreatha Massed, MD.,have documented all relevant documentation on the behalf of Doreatha Massed, MD,as directed by  Doreatha Massed, MD while in the presence of Doreatha Massed, MD.   I, Doreatha Massed MD, have reviewed the above documentation for accuracy and completeness, and I agree with the above.   Doreatha Massed, MD   4/24/20246:14 PM  CHIEF COMPLAINT:   Diagnosis: Metastatic urothelial carcinoma of the bladder    Cancer Staging  No matching staging information was found for the patient.   Prior Therapy: 1. Neoadjuvant gemcitabine/cisplatin on 05/02/17 (one cycle) 2. Cystoprostatectomy and bilateral lymphadenectomy on 06/29/2017 (Dr. Berneice Heinrich) 3. Radiation therapy to the pelvic lesion 01/02/18 - 01/16/18 4. Carboplatin and gemcitabine, 6 cycles 02/06/18 - 05/22/18 5. Pembrolizumab 06/21/18 - 12/13/18 6. Radiation therapy to RUL and right paratracheal nodes 04/09/20 - 04/23/20 7. Radiation therapy  to intrathoracic lymph nodes 11/16/21 - 11/27/21  Current Therapy:  surveillance    HISTORY OF PRESENT ILLNESS:   Oncology History  Stage IV malignant neoplasm of urinary bladder /with mets to the bones and lungs  04/20/2017 Initial  Diagnosis   Malignant neoplasm of urinary bladder (HCC)   01/30/2018 - 05/22/2018 Chemotherapy   The patient had palonosetron (ALOXI) injection 0.25 mg, 0.25 mg, Intravenous,  Once, 6 of 6 cycles Administration: 0.25 mg (01/30/2018), 0.25 mg (02/20/2018), 0.25 mg (03/13/2018), 0.25 mg (04/03/2018), 0.25 mg (05/01/2018), 0.25 mg (05/22/2018) CARBOplatin (PARAPLATIN) 400 mg in sodium chloride 0.9 % 250 mL chemo infusion, 400 mg (100 % of original dose 400 mg), Intravenous,  Once, 6 of 6 cycles Dose modification: 400 mg (original dose 400 mg, Cycle 1),   (original dose 400 mg, Cycle 2, Reason: Patient Age), 410 mg (original dose 400 mg, Cycle 6) Administration: 400 mg (01/30/2018), 400 mg (02/20/2018), 400 mg (03/13/2018), 410 mg (04/03/2018), 410 mg (05/01/2018), 410 mg (05/22/2018) gemcitabine (GEMZAR) 2,128 mg in sodium chloride 0.9 % 250 mL chemo infusion, 1,000 mg/m2 = 2,128 mg, Intravenous,  Once, 6 of 6 cycles Dose modification: 800 mg/m2 (original dose 1,000 mg/m2, Cycle 4, Reason: Provider Judgment), 800 mg/m2 (original dose 1,000 mg/m2, Cycle 4, Reason: Provider Judgment) Administration: 2,128 mg (01/30/2018), 2,128 mg (02/06/2018), 2,128 mg (02/20/2018), 2,128 mg (02/27/2018), 2,128 mg (03/13/2018), 1,710 mg (04/03/2018), 1,710 mg (04/10/2018), 1,710 mg (05/01/2018), 1,710 mg (05/22/2018) fosaprepitant (EMEND) 150 mg, dexamethasone (DECADRON) 12 mg in sodium chloride 0.9 % 145 mL IVPB, , Intravenous,  Once, 6 of 6 cycles Administration:  (01/30/2018),  (02/20/2018),  (03/13/2018),  (04/03/2018),  (05/01/2018),  (05/22/2018)  for chemotherapy treatment.    Bladder cancer  06/28/2017 Initial Diagnosis   Bladder cancer (HCC)   06/21/2018 - 12/13/2018 Chemotherapy   The patient had pembrolizumab (KEYTRUDA) 200 mg in sodium chloride 0.9 % 50 mL chemo infusion, 200 mg, Intravenous, Once, 8 of 9 cycles Administration: 200 mg (06/21/2018), 200 mg (07/12/2018), 200 mg (08/02/2018), 200 mg (08/23/2018), 200 mg (09/13/2018), 200 mg  (10/04/2018), 200 mg (11/22/2018), 200 mg (12/13/2018)  for chemotherapy treatment.       INTERVAL HISTORY:   [name] is a 82 y.o. male presenting to clinic today for follow up of metastatic urothelial carcinoma of the bladder. He was last seen by me on 11/29/22.  Following his last visit, he was sent for thoracentesis the same day, while yielded 1.5 L of pleural fluid. Cytology from the fluid was negative for malignancy. Culture was also negative.  Today, he states that he is doing well overall. His appetite level is at 40%. His energy level is at 20%.  PAST MEDICAL HISTORY:   Past Medical History: Past Medical History:  Diagnosis Date   Bladder cancer    Bone cancer    BPH (benign prostatic hypertrophy)    Dysuria    GERD (gastroesophageal reflux disease)    History of adenomatous polyp of colon    tubular adenoma 06/ 2018   History of bladder cancer 12/2014   urologist-  dr Sherryl Barters--  dx High Grade TCC without stromal invasion s/p TURBT and intravesical BCG tx/  recurrent 07/2015 (pTa) TCC  repear intravesical BCG tx    History of hiatal hernia    History of urethral stricture    Nocturia     Surgical History: Past Surgical History:  Procedure Laterality Date   CARDIAC CATHETERIZATION  1997   normal coronary arteries   CARDIOVASCULAR STRESS TEST  12-14-2005   Low risk perfusion study/  very small scar in the inferior septum from mid ventricle to apex,  no ischemia/  mild inferior septal hypokinesis/  ef 58%   CATARACT EXTRACTION W/ INTRAOCULAR LENS  IMPLANT, BILATERAL  2011   COLONOSCOPY  last one 06/ 2018   CYSTOSCOPY WITH BIOPSY N/A 08/12/2015   Procedure: CYSTOSCOPY WITH BIOPSY AND FULGERATION;  Surgeon: Hildred Laser, MD;  Location: [facility];  Service: Urology;  Laterality: N/A;   CYSTOSCOPY WITH INJECTION N/A 06/29/2017   Procedure: CYSTOSCOPY WITH INJECTION OF INDOCYANINE GREEN DYE;  Surgeon: Sebastian Ache, MD;  Location: [facility];  Service:  Urology;  Laterality: N/A;   CYSTOSCOPY WITH URETHRAL DILATATION N/A 03/21/2017   Procedure: CYSTOSCOPY;  Surgeon: Hildred Laser, MD;  Location: [facility];  Service: Urology;  Laterality: N/A;   ESOPHAGOGASTRODUODENOSCOPY  12-05-2014   IR IMAGING GUIDED PORT INSERTION  02/07/2018   IR NEPHROSTOMY PLACEMENT LEFT  12/25/2021   IR REMOVAL TUN ACCESS W/ PORT W/O FL MOD SED  04/14/2018   TRANSTHORACIC ECHOCARDIOGRAM  01-31-2008   nomral LVF,  ef 55-65%/  mild pulmonic stenosis without regurg.,  peak transpulomonic valve gradient   TRANSURETHRAL RESECTION OF BLADDER TUMOR N/A 12/31/2014   Procedure: TRANSURETHRAL RESECTION OF BLADDER TUMOR (TURBT);  Surgeon: Su Grand, MD;  Location: [facility];  Service: Urology;  Laterality: N/A;   TRANSURETHRAL RESECTION OF BLADDER TUMOR N/A 03/21/2017   Procedure: POSSIBLE TRANSURETHRAL RESECTION OF BLADDER TUMOR (TURBT);  Surgeon: Hildred Laser, MD;  Location: [facility];  Service: Urology;  Laterality: N/A;    Social History: Social History   Socioeconomic History   Marital status: Married    Spouse name: Not on file   Number of children: 1   Years of education: Not on file   Highest education level: Not on file  Occupational History   Occupation: Retired  Tobacco Use   Smoking status: Former    Packs/day: 1.00    Years: 34.00    Additional pack years: 0.00    Total pack years: 34.00    Types: Cigarettes    Quit date: 12/26/1988    Years since quitting: 33.9   Smokeless tobacco: Never  Vaping Use   Vaping Use: Never used  Substance and Sexual Activity   Alcohol use: Yes    Alcohol/week: 6.0 standard drinks of alcohol    Types: 6 Cans of beer per week    Comment: BEER   Drug use: No   Sexual activity: Not Currently  Other Topics Concern   Not on file  Social History Narrative   Not on file   Social Determinants of Health   Financial Resource Strain: Not on file  Food  Insecurity: No Food Insecurity (08/17/2022)   Hunger Vital Sign    Worried About Running Out of Food in the Last Year: Never true    Ran Out of Food in the Last Year: Never true  Transportation Needs: No Transportation Needs (08/17/2022)   PRAPARE - Administrator, Civil Service (Medical): No    Lack of Transportation (Non-Medical): No  Physical Activity: Not on file  Stress: Not on file  Social Connections: Not on file  Intimate Partner Violence: Not At Risk (08/17/2022)   Humiliation, Afraid, Rape, and Kick questionnaire    Fear of Current or Ex-Partner: No    Emotionally Abused: No    Physically Abused: No  Sexually Abused: No    Family History: Family History  Problem Relation Age of Onset   Alcoholism Father    Colon cancer Neg Hx    Colon polyps Neg Hx    Diabetes Neg Hx    Kidney disease Neg Hx    Esophageal cancer Neg Hx    Heart disease Neg Hx    Gallbladder disease Neg Hx    Allergic rhinitis Neg Hx    Angioedema Neg Hx    Asthma Neg Hx    Atopy Neg Hx    Eczema Neg Hx    Immunodeficiency Neg Hx    Urticaria Neg Hx     Current Medications:  Current Outpatient Medications:    acetaminophen (TYLENOL) 500 MG tablet, Take 1,000 mg by mouth every 6 (six) hours as needed for mild pain, fever, moderate pain or headache., Disp: , Rfl:    amoxicillin-clavulanate (AUGMENTIN) 875-125 MG tablet, Take 1 tablet by mouth 2 (two) times daily., Disp: 14 tablet, Rfl: 0   apixaban (ELIQUIS) 5 MG TABS tablet, Take 1 tablet (5 mg total) by mouth 2 (two) times daily., Disp: 60 tablet, Rfl: 2   benzonatate (TESSALON) 200 MG capsule, Take 1 capsule (200 mg total) by mouth 3 (three) times daily as needed for cough., Disp: 21 capsule, Rfl: 0   methenamine (HIPREX) 1 g tablet, Take 1 tablet (1 g total) by mouth 2 (two) times daily with a meal., Disp: 60 tablet, Rfl: 11   metoprolol tartrate (LOPRESSOR) 25 MG tablet, Take 1 tablet (25 mg total) by mouth 2 (two) times daily., Disp:  60 tablet, Rfl: 2   oxyCODONE (OXY IR/ROXICODONE) 5 MG immediate release tablet, Take 1 tablet (5 mg total) by mouth every 4 (four) hours as needed for severe pain., Disp: 180 tablet, Rfl: 0   pantoprazole (PROTONIX) 40 MG tablet, Take 1 tablet (40 mg total) by mouth daily., Disp: 30 tablet, Rfl: 1  Current Facility-Administered Medications:    omalizumab Geoffry Paradise) prefilled syringe 300 mg, 300 mg, Subcutaneous, Q14 Days, Alfonse Spruce, MD, 300 mg at 11/24/22 4034   Allergies: Allergies  Allergen Reactions   Ciprofloxacin Hives   Zofran [Ondansetron] Rash    REVIEW OF SYSTEMS:   Review of Systems  Constitutional:  Negative for chills, fatigue and fever.  HENT:   Negative for lump/mass, mouth sores, nosebleeds, sore throat and trouble swallowing.   Eyes:  Negative for eye problems.  Respiratory:  Positive for cough and shortness of breath.   Cardiovascular:  Negative for chest pain, leg swelling and palpitations.  Gastrointestinal:  Negative for abdominal pain, constipation, diarrhea, nausea and vomiting.  Genitourinary:  Negative for bladder incontinence, difficulty urinating, dysuria, frequency, hematuria and nocturia.   Musculoskeletal:  Negative for arthralgias, back pain, flank pain, myalgias and neck pain.  Skin:  Negative for itching and rash.  Neurological:  Negative for dizziness, headaches and numbness.  Hematological:  Does not bruise/bleed easily.  Psychiatric/Behavioral:  Positive for sleep disturbance. Negative for depression and suicidal ideas. The patient is not nervous/anxious.   All other systems reviewed and are negative.    VITALS:   Blood pressure 111/70, pulse 86, temperature (!) 97.5 F (36.4 C), temperature source Oral, resp. rate 18, weight 181 lb 14.1 oz (82.5 kg), SpO2 94 %.  Wt Readings from Last 3 Encounters:  12/08/22 181 lb 14.1 oz (82.5 kg)  11/29/22 183 lb 11.2 oz (83.3 kg)  10/15/22 185 lb (83.9 kg)    Body mass index  is 25.37  kg/m.  Performance status (ECOG): 1 - Symptomatic but completely ambulatory  PHYSICAL EXAM:   Physical Exam Vitals and nursing note reviewed. Exam conducted with a chaperone present.  Constitutional:      Appearance: Normal appearance.  Cardiovascular:     Rate and Rhythm: Normal rate and regular rhythm.     Pulses: Normal pulses.     Heart sounds: Normal heart sounds.  Pulmonary:     Effort: Pulmonary effort is normal.     Breath sounds: Normal breath sounds.  Abdominal:     Palpations: Abdomen is soft. There is no hepatomegaly, splenomegaly or mass.     Tenderness: There is no abdominal tenderness.  Musculoskeletal:     Right lower leg: No edema.     Left lower leg: No edema.  Lymphadenopathy:     Cervical: No cervical adenopathy.     Right cervical: No superficial, deep or posterior cervical adenopathy.    Left cervical: No superficial, deep or posterior cervical adenopathy.     Upper Body:     Right upper body: No supraclavicular or axillary adenopathy.     Left upper body: No supraclavicular or axillary adenopathy.  Neurological:     General: No focal deficit present.     Mental Status: He is alert and oriented to person, place, and time.  Psychiatric:        Mood and Affect: Mood normal.        Behavior: Behavior normal.     LABS:      Latest Ref Rng & Units 07/12/2022    3:49 PM 07/02/2022    9:35 AM 06/22/2022    4:56 AM  CBC  WBC 4.0 - 10.5 K/uL 7.6  9.2  9.8   Hemoglobin 13.0 - 17.0 g/dL 16.1  09.6  04.5   Hematocrit 39.0 - 52.0 % 46.4  41.5  40.1   Platelets 150 - 400 K/uL 291  188  267       Latest Ref Rng & Units 07/12/2022    3:49 PM 07/02/2022    9:35 AM 06/22/2022    4:56 AM  CMP  Glucose 70 - 99 mg/dL 409  811  97   BUN 8 - 23 mg/dL 18  18  22    Creatinine 0.61 - 1.24 mg/dL 9.14  7.82  9.56   Sodium 135 - 145 mmol/L 141  141  142   Potassium 3.5 - 5.1 mmol/L 4.1  3.9  4.0   Chloride 98 - 111 mmol/L 106  109  104   CO2 22 - 32 mmol/L 23   28  27    Calcium 8.9 - 10.3 mg/dL 9.2  9.0  9.1   Total Protein 6.5 - 8.1 g/dL  6.0    Total Bilirubin 0.3 - 1.2 mg/dL  0.5    Alkaline Phos 38 - 126 U/L  56    AST 15 - 41 U/L  13    ALT 0 - 44 U/L  15       No results found for: "CEA1", "CEA" / No results found for: "CEA1", "CEA" No results found for: "PSA1" No results found for: "OZH086" No results found for: "CAN125"  No results found for: "TOTALPROTELP", "ALBUMINELP", "A1GS", "A2GS", "BETS", "BETA2SER", "GAMS", "MSPIKE", "SPEI" Lab Results  Component Value Date   TIBC 249 03/20/2018   FERRITIN 759 (H) 03/20/2018   IRONPCTSAT 20 (L) 03/20/2018   Lab Results  Component Value Date   LDH 96 (L)  06/21/2022     STUDIES:   US THORACENTESIS ASP PLEURAL SPACE W/IMG GUIDE  Result Date: 11/29/2022 INDICATION: Right pleural effusion.  Previous right thoracentesis last November. EXAM: ULTRASOUND GUIDED RIGHT THORACENTESIS MEDICATIONS: None. COMPLICATIONS: None immediate. PROCEDURE: An ultrasound guided thoracentesis was thoroughly discussed with the patient and questions answered. The benefits, risks, alternatives and complications were also discussed. The patient understands and wishes to proceed with the procedure. Written consent was obtained. Ultrasound was performed to localize and mark an adequate pocket of fluid in the right chest. The area was then prepped and draped in the normal sterile fashion. 1% Lidocaine was used for local anesthesia. Under ultrasound guidance a 6 Fr Safe-T-Centesis catheter was introduced. Thoracentesis was performed. The catheter was removed and a dressing applied. FINDINGS: A total of approximately 1.5 L of serous pleural fluid was removed. Samples were sent to the laboratory as requested by the clinical team. IMPRESSION: Successful ultrasound guided right thoracentesis yielding 1.5 L of pleural fluid. Electronically Signed   By: Obie Dredge M.D.   On: 11/29/2022 13:33   DG Chest 1 View  Result Date:  11/29/2022 CLINICAL DATA:  Status post right thoracentesis. EXAM: CHEST  1 VIEW COMPARISON:  PET-CT dated November 25, 2022. Chest x-ray dated November 15, 2022. FINDINGS: Stable cardiomediastinal silhouette with normal heart size. Decreased now small right and unchanged small left pleural effusions. Mild right and minimal left basilar atelectasis. No pneumothorax. No acute osseous abnormality. IMPRESSION: 1. Decreased now small right and unchanged small left pleural effusions. No pneumothorax. Electronically Signed   By: Obie Dredge M.D.   On: 11/29/2022 13:32   NM PET Image Restag (PS) Skull Base To Thigh  Result Date: 11/29/2022 CLINICAL DATA:  Subsequent treatment strategy for bladder cancer. EXAM: NUCLEAR MEDICINE PET SKULL BASE TO THIGH TECHNIQUE: 10.35 mCi F-18 FDG was injected intravenously. Full-ring PET imaging was performed from the skull base to thigh after the radiotracer. CT data was obtained and used for attenuation correction and anatomic localization. Fasting blood glucose: 103 mg/dl COMPARISON:  PET-CT 69/62/9528 and 03/15/2022. FINDINGS: Mediastinal blood pool activity: SUV max NECK: No hypermetabolic cervical lymph nodes are identified. No suspicious activity identified within the pharyngeal mucosal space. Incidental CT findings: Bilateral carotid atherosclerosis. CHEST: No significant residual hypermetabolic mediastinal, hilar or axillary lymph nodes. Interval decreased activity within a previously demonstrated mildly hypermetabolic subcarinal lymph node (currently with an SUV max of 4.3, previously 5.1). Chronic nodule anteriorly in the left upper lobe is stable, measuring 8 mm on image 26/7, without hypermetabolic activity. There are new patchy ground-glass opacities medially in both upper lobes with associated low level metabolic activity (up to an SUV max of 4.1 on the right). These are likely inflammatory/infectious. No suspicious pulmonary nodularity. Incidental CT findings: Enlarging  bilateral pleural effusions, moderate to large on the right and moderate on the left. Associated increased dependent atelectasis in both lungs. No residual focal abnormality identified anterior to the left hilum. Atherosclerosis of the aorta, great vessels and coronary arteries. ABDOMEN/PELVIS: There is no hypermetabolic activity within the liver, adrenal glands, spleen or pancreas. Low level activity in the adrenal glands bilaterally is unchanged and within physiologic limits. There is no hypermetabolic nodal activity in the abdomen or pelvis. Stable anorectal activity, also likely physiologic. Incidental CT findings: Stable hepatic cysts, small gallstones, aortic and branch vessel atherosclerosis and postsurgical changes related to prior cystectomy and urinary diversion. SKELETON: Stable treated expansile lytic lesion in the right inferior pubic ramus and ischium without recurrent  hypermetabolic activity. No new osseous lesions are identified to suggest metastatic disease. Incidental CT findings: Stable mild spondylosis and moderate bilateral gynecomastia. IMPRESSION: 1. No evidence of local recurrence of bladder cancer or metastatic disease. 2. A previously demonstrated mildly hypermetabolic subcarinal lymph node has decreased in metabolic activity. 3. New patchy ground-glass opacities medially in both upper lobes with associated low level metabolic activity, likely inflammatory/infectious. 4. Enlarging bilateral pleural effusions with increased dependent atelectasis in both lungs. 5. Stable treated lytic lesion in the right inferior pubic ramus and ischium. 6.  Aortic Atherosclerosis (ICD10-I70.0). Electronically Signed   By: Carey Bullocks M.D.   On: 11/29/2022 10:42   DG HIP UNILAT WITH PELVIS 2-3 VIEWS RIGHT  Result Date: 11/15/2022 CLINICAL DATA:  RIGHT hip pain for 4 years that is worsening, history of bladder cancer metastatic to bone EXAM: DG HIP (WITH OR WITHOUT PELVIS) 2-3V RIGHT COMPARISON:  PET-CT  07/28/2022 FINDINGS: Osseous demineralization. Narrowing of the hip joints bilaterally. Again identified destructive metastatic lesion at the inferior RIGHT pubic ramus. No additional fracture, dislocation, or bone destruction. IMPRESSION: Large destructive metastatic lesion at RIGHT inferior pubic ramus as noted on prior PET-CT. No focal abnormalities of the proximal RIGHT femur or hip joint. Electronically Signed   By: Ulyses Southward M.D.   On: 11/15/2022 17:13   DG Chest 2 View  Result Date: 11/15/2022 CLINICAL DATA:  Dyspnea, shortness of breath, cough for 1 year EXAM: CHEST - 2 VIEW COMPARISON:  10/15/2022 FINDINGS: Normal heart size and pulmonary vascularity. Fullness of LEFT hilum question slightly more prominent than on the previous exam. Persistent small bibasilar effusions and atelectasis. Persistent bronchitic changes. No new infiltrate or pneumothorax. Bones demineralized. IMPRESSION: Persistent small bibasilar effusions and atelectasis. Persistent bronchitic changes. Fullness of LEFT pulmonary hilum appears slightly more pronounced than on previous exam, mass/adenopathy not entirely excluded; CT chest with contrast recommended for further evaluation. Electronically Signed   By: Ulyses Southward M.D.   On: 11/15/2022 17:10

## 2022-12-08 ENCOUNTER — Inpatient Hospital Stay (HOSPITAL_BASED_OUTPATIENT_CLINIC_OR_DEPARTMENT_OTHER): Payer: Medicare Other | Admitting: Hematology

## 2022-12-08 VITALS — BP 111/70 | HR 86 | Temp 97.5°F | Resp 18 | Wt 181.9 lb

## 2022-12-08 DIAGNOSIS — C679 Malignant neoplasm of bladder, unspecified: Secondary | ICD-10-CM | POA: Insufficient documentation

## 2022-12-08 DIAGNOSIS — C7802 Secondary malignant neoplasm of left lung: Secondary | ICD-10-CM | POA: Diagnosis not present

## 2022-12-08 DIAGNOSIS — C7801 Secondary malignant neoplasm of right lung: Secondary | ICD-10-CM | POA: Insufficient documentation

## 2022-12-08 DIAGNOSIS — C7951 Secondary malignant neoplasm of bone: Secondary | ICD-10-CM | POA: Insufficient documentation

## 2022-12-08 DIAGNOSIS — C771 Secondary and unspecified malignant neoplasm of intrathoracic lymph nodes: Secondary | ICD-10-CM

## 2022-12-08 MED ORDER — BENZONATATE 200 MG PO CAPS: 200.0000 mg | ORAL_CAPSULE | Freq: Three times a day (TID) | ORAL | 0 refills | Status: DC | PRN

## 2022-12-08 MED ORDER — AMOXICILLIN-POT CLAVULANATE 875-125 MG PO TABS: 1.0000 | ORAL_TABLET | Freq: Two times a day (BID) | ORAL | 0 refills | Status: DC

## 2022-12-08 NOTE — Patient Instructions (Addendum)
[facility] at [facility] Discharge Instructions   You were seen and examined today by Dr. Ellin Saba.  He reviewed the results of the lab work that was done on the fluid removed from your lungs. There was no sign of malignant (cancer) cells in the fluid.  There was also no sign of infection in this fluid.   See the pulmonologist on Friday as scheduled. We need their input on what may be causing the fluid to accumulate around your lungs.   We will give you a trial of antibiotics to see if your cough has improved.    Thank you for choosing [facility] at [facility] to provide your oncology and hematology care.  To afford each patient quality time with our provider, please arrive at least 15 minutes before your scheduled appointment time.   If you have a lab appointment with the Cancer Center please come in thru the Main Entrance and check in at the main information desk.  You need to re-schedule your appointment should you arrive 10 or more minutes late.  We strive to give you quality time with our providers, and arriving late affects you and other patients whose appointments are after yours.  Also, if you no show three or more times for appointments you may be dismissed from the clinic at the providers discretion.     Again, thank you for choosing [facility].  Our hope is that these requests will decrease the amount of time that you wait before being seen by our physicians.       _____________________________________________________________  Should you have questions after your visit to [facility], please contact our office at [phone] and follow the prompts.  Our office hours are 8:00 a.m. and 4:30 p.m. Monday - Friday.  Please note that voicemails left after 4:00 p.m. may not be returned until the following business day.  We are closed weekends and major holidays.  You do have access to a nurse 24-7, just call the  main number to the clinic [phone] and do not press any options, hold on the line and a nurse will answer the phone.    For prescription refill requests, have your pharmacy contact our office and allow 72 hours.    Due to Covid, you will need to wear a mask upon entering the hospital. If you do not have a mask, a mask will be given to you at the Main Entrance upon arrival. For doctor visits, patients may have 1 support person age 5 or older with them. For treatment visits, patients can not have anyone with them due to social distancing guidelines and our immunocompromised population.

## 2022-12-10 ENCOUNTER — Telehealth: Payer: Self-pay | Admitting: Pulmonary Disease

## 2022-12-10 ENCOUNTER — Ambulatory Visit (INDEPENDENT_AMBULATORY_CARE_PROVIDER_SITE_OTHER): Payer: Medicare Other | Admitting: Pulmonary Disease

## 2022-12-10 ENCOUNTER — Encounter: Payer: Self-pay | Admitting: Pulmonary Disease

## 2022-12-10 VITALS — BP 104/68 | HR 80 | Ht 71.0 in | Wt 182.0 lb

## 2022-12-10 DIAGNOSIS — R0602 Shortness of breath: Secondary | ICD-10-CM | POA: Diagnosis not present

## 2022-12-10 DIAGNOSIS — J90 Pleural effusion, not elsewhere classified: Secondary | ICD-10-CM | POA: Diagnosis not present

## 2022-12-10 LAB — BRAIN NATRIURETIC PEPTIDE: Pro B Natriuretic peptide (BNP): 27 pg/mL (ref 0.0–100.0)

## 2022-12-10 LAB — BASIC METABOLIC PANEL
BUN: 13 mg/dL (ref 6–23)
CO2: 29 mEq/L (ref 19–32)
Calcium: 9.3 mg/dL (ref 8.4–10.5)
Chloride: 103 mEq/L (ref 96–112)
Creatinine, Ser: 1.04 mg/dL (ref 0.40–1.50)
GFR: 67.23 mL/min (ref >60.00)
Glucose, Bld: 97 mg/dL (ref 70–99)
Potassium: 4.3 mEq/L (ref 3.5–5.1)
Sodium: 139 mEq/L (ref 135–145)

## 2022-12-10 NOTE — Patient Instructions (Signed)
Schedule for echocardiogram  Schedule for chest x-ray in 2 to 3 weeks from now  Obtain BMP, BNP-blood work  Schedule for follow-up in [place] in 3 to 4 weeks  Echo and x-ray can be done in [place]  Continue cough medicine  Call us with significant concerns  Continue to try to use Trelegy if he can tolerate it

## 2022-12-10 NOTE — Addendum Note (Signed)
Addended by: [name] on: 12/10/2022 09:27 AM   Modules accepted: Orders

## 2022-12-10 NOTE — Telephone Encounter (Signed)
PT saw Dr. Wynona Neat today and he referred them to Dr. Craige Cotta or Vassie Loll. He wanted them to have a 3-4 wk FU but no appts until July. I set that July appt but I wanted to see if, after reviewing the chart, Dr. Craige Cotta could fit them in sooner. Please call PT to advise. OK to speak to wife @ [phone]

## 2022-12-10 NOTE — Progress Notes (Signed)
[name]    [id number]    12-Dec-1940  Primary Care Physician:Fusco, Lyman Bishop, MD  Referring Physician: Elfredia Nevins, MD [address] [place],  [place] [address]  Chief complaint:   Patient with cough, shortness of breath  HPI:  Chronic cough  Cough is not any better despite trying Trelegy which he has not been able to use frequently because of persistent cough  Recently treated with a course of antibiotics  Recently had a thoracentesis for 1.5 L of fluid on the right side -Fluid is exudative by protein of 3.2 -Cytology have been negative  Decreased appetite Decreased activity level  Continues to cough  Based on PET scan findings, concern for infection was started on a course of antibiotics, cough medicine was also added He just started the cough medicine Unclear whether it is going to help at present  Has had 2 thoracentesis now  Had radiation treatment to paratracheal lymph node in 2021 and April 2023 History of bladder cancer for which he is on treatment  He had a PET scan in December showing recurrence of right pleural effusion  He had been told about bilateral pleural effusions when he had his previous thoracentesis but the left side was not drained at the time  The PET scan in December did not reveal significant pleural effusion on the left Most recent PET scan showed bilateral large pleural effusion, right was drained for 1.5 L  Continues to have cough and  Reformed smoker quit about 30 to 40 years ago, was not labeled with any lung disease  Has been more sedentary lately   Outpatient Encounter Medications as of 12/10/2022  Medication Sig   acetaminophen (TYLENOL) 500 MG tablet Take 1,000 mg by mouth every 6 (six) hours as needed for mild pain, fever, moderate pain or headache.   amoxicillin-clavulanate (AUGMENTIN) 875-125 MG tablet Take 1 tablet by mouth 2 (two) times daily.   apixaban (ELIQUIS) 5 MG TABS tablet Take 1 tablet (5  mg total) by mouth 2 (two) times daily.   benzonatate (TESSALON) 200 MG capsule Take 1 capsule (200 mg total) by mouth 3 (three) times daily as needed for cough.   methenamine (HIPREX) 1 g tablet Take 1 tablet (1 g total) by mouth 2 (two) times daily with a meal.   metoprolol tartrate (LOPRESSOR) 25 MG tablet Take 1 tablet (25 mg total) by mouth 2 (two) times daily.   oxyCODONE (OXY IR/ROXICODONE) 5 MG immediate release tablet Take 1 tablet (5 mg total) by mouth every 4 (four) hours as needed for severe pain.   pantoprazole (PROTONIX) 40 MG tablet Take 1 tablet (40 mg total) by mouth daily.   Facility-Administered Encounter Medications as of 12/10/2022  Medication   omalizumab Geoffry Paradise) prefilled syringe 300 mg    Allergies as of 12/10/2022 - Review Complete 12/10/2022  Allergen Reaction Noted   Ciprofloxacin Hives 09/10/2014   Zofran [ondansetron] Rash 02/05/2021    Past Medical History:  Diagnosis Date   Bladder cancer (HCC)    Bone cancer (HCC)    BPH (benign prostatic hypertrophy)    Dysuria    GERD (gastroesophageal reflux disease)    History of adenomatous polyp of colon    tubular adenoma 06/ 2018   History of bladder cancer 12/2014   urologist-  dr Sherryl Barters--  dx High Grade TCC without stromal invasion s/p TURBT and intravesical BCG tx/  recurrent 07/2015 (pTa) TCC  repear intravesical BCG tx  History of hiatal hernia    History of urethral stricture    Nocturia     Past Surgical History:  Procedure Laterality Date   CARDIAC CATHETERIZATION  1997   normal coronary arteries   CARDIOVASCULAR STRESS TEST  12-14-2005   Low risk perfusion study/  very small scar in the inferior septum from mid ventricle to apex,  no ischemia/  mild inferior septal hypokinesis/  ef 58%   CATARACT EXTRACTION W/ INTRAOCULAR LENS  IMPLANT, BILATERAL  2011   COLONOSCOPY  last one 06/ 2018   CYSTOSCOPY WITH BIOPSY N/A 08/12/2015   Procedure: CYSTOSCOPY WITH BIOPSY AND FULGERATION;  Surgeon:  Hildred Laser, MD;  Location: [facility];  Service: Urology;  Laterality: N/A;   CYSTOSCOPY WITH INJECTION N/A 06/29/2017   Procedure: CYSTOSCOPY WITH INJECTION OF INDOCYANINE GREEN DYE;  Surgeon: Sebastian Ache, MD;  Location: [facility];  Service: Urology;  Laterality: N/A;   CYSTOSCOPY WITH URETHRAL DILATATION N/A 03/21/2017   Procedure: CYSTOSCOPY;  Surgeon: Hildred Laser, MD;  Location: [facility];  Service: Urology;  Laterality: N/A;   ESOPHAGOGASTRODUODENOSCOPY  12-05-2014   IR IMAGING GUIDED PORT INSERTION  02/07/2018   IR NEPHROSTOMY PLACEMENT LEFT  12/25/2021   IR REMOVAL TUN ACCESS W/ PORT W/O FL MOD SED  04/14/2018   TRANSTHORACIC ECHOCARDIOGRAM  01-31-2008   nomral LVF,  ef 55-65%/  mild pulmonic stenosis without regurg.,  peak transpulomonic valve gradient   TRANSURETHRAL RESECTION OF BLADDER TUMOR N/A 12/31/2014   Procedure: TRANSURETHRAL RESECTION OF BLADDER TUMOR (TURBT);  Surgeon: Su Grand, MD;  Location: [facility];  Service: Urology;  Laterality: N/A;   TRANSURETHRAL RESECTION OF BLADDER TUMOR N/A 03/21/2017   Procedure: POSSIBLE TRANSURETHRAL RESECTION OF BLADDER TUMOR (TURBT);  Surgeon: Hildred Laser, MD;  Location: [facility];  Service: Urology;  Laterality: N/A;    Family History  Problem Relation Age of Onset   Alcoholism Father    Colon cancer Neg Hx    Colon polyps Neg Hx    Diabetes Neg Hx    Kidney disease Neg Hx    Esophageal cancer Neg Hx    Heart disease Neg Hx    Gallbladder disease Neg Hx    Allergic rhinitis Neg Hx    Angioedema Neg Hx    Asthma Neg Hx    Atopy Neg Hx    Eczema Neg Hx    Immunodeficiency Neg Hx    Urticaria Neg Hx     Social History   Socioeconomic History   Marital status: Married    Spouse name: Not on file   Number of children: 1   Years of education: Not on file   Highest education level: Not on file  Occupational History   Occupation:  Retired  Tobacco Use   Smoking status: Former    Packs/day: 1.00    Years: 34.00    Additional pack years: 0.00    Total pack years: 34.00    Types: Cigarettes    Quit date: 12/26/1988    Years since quitting: 33.9   Smokeless tobacco: Never  Vaping Use   Vaping Use: Never used  Substance and Sexual Activity   Alcohol use: Yes    Alcohol/week: 6.0 standard drinks of alcohol    Types: 6 Cans of beer per week    Comment: BEER   Drug use: No   Sexual activity: Not Currently  Other Topics Concern   Not on file  Social History Narrative   Not on file   Social Determinants of Health   Financial Resource Strain: Not on file  Food Insecurity: No Food Insecurity (08/17/2022)   Hunger Vital Sign    Worried About Running Out of Food in the Last Year: Never true    Ran Out of Food in the Last Year: Never true  Transportation Needs: No Transportation Needs (08/17/2022)   PRAPARE - Administrator, Civil Service (Medical): No    Lack of Transportation (Non-Medical): No  Physical Activity: Not on file  Stress: Not on file  Social Connections: Not on file  Intimate Partner Violence: Not At Risk (08/17/2022)   Humiliation, Afraid, Rape, and Kick questionnaire    Fear of Current or Ex-Partner: No    Emotionally Abused: No    Physically Abused: No    Sexually Abused: No    Review of Systems  Constitutional:  Positive for fatigue.  Respiratory:  Positive for cough and shortness of breath.    Vitals:   12/10/22 0838  BP: 104/68  Pulse: 80  SpO2: 95%    Physical Exam Constitutional:      Appearance: Normal appearance.  HENT:     Head: Normocephalic.     Mouth/Throat:     Mouth: Mucous membranes are moist.  Eyes:     General: No scleral icterus. Cardiovascular:     Rate and Rhythm: Normal rate and regular rhythm.     Heart sounds: No murmur heard.    No friction rub.  Pulmonary:     Effort: No respiratory distress.     Breath sounds: No stridor. No wheezing or  rhonchi.     Comments: Decreased air entry bilaterally Abdominal:     General: Abdomen is flat.  Musculoskeletal:     Cervical back: No rigidity or tenderness.  Neurological:     Mental Status: He is alert.  Psychiatric:        Mood and Affect: Mood normal.    Data Reviewed: Previous chest x-ray reviewed  Most recent PET scan reviewed showing right pleural effusion, adenopathy  PET scan reviewed, postthoracentesis chest x-ray reviewed-still with bilateral effusion  Last echocardiogram was October 2023-with normal ejection fraction, no mention of diastolic dysfunction  Pulmonary function test shows severe obstruction with no significant bronchodilator response Mild restriction with mild reduction in diffusing capacity  Assessment:  Recurrent bilateral pleural effusion S/p thoracentesis x 2 on the right side -Cytology negative, exudative effusion  Concerned that this may be related to heart failure Will obtain BMP, obtain BNP as well Will schedule him for echocardiogram to assess cardiac function to see if there is any worsening  History of bladder cancer, follows up with oncology -Has seen oncology recently -Ongoing treatment  Recurrent sustained cough, -Trial with an inhaler is appropriate -Has not been able to tolerate Trelegy, anytime he tries to use it starts to cough   Subcarinal lymph node with an SUV of 5.1-being followed by oncology -Does have a PET scan scheduled for June  Plan/Recommendations: Obtain BNP, obtain BMP  Schedule for echocardiogram  Will need to consider cautious diuresis -I think will be appropriate to obtain some lab work prior to starting him on diuresis since his last echocardiogram in October 2023 was within normal limits  Continue to try to use Trelegy  Obtain chest x-ray in 2 to 3 weeks  Can be seen in about 3 to 4 weeks  Patient having difficulty traveling to [facility] for care, is  requesting possibility of seeing somebody in  [place] -Will try and make appointment with one of my colleagues in [place] in about 3 to 4 weeks  -He should get an echocardiogram, blood work, chest x-ray prior to that visit -Diuresis can be considered at that time   Encouraged to give [place] a call if any significant concerns  Encouraged to complete course of antibiotics and continue cough medicine   Virl Diamond MD [facility] 12/10/2022, 8:45 AM  CC: Elfredia Nevins, MD

## 2022-12-13 ENCOUNTER — Inpatient Hospital Stay: Payer: Medicare Other | Admitting: Dietician

## 2022-12-13 NOTE — Progress Notes (Signed)
Nutrition Follow-up:  Patient currently under surveillance for bladder cancer  4/15 - Thoracentesis (-1.5 L) negative for malignancy  Spoke with wife via telephone. Wife reports patient is not feeling well and resting this afternoon. Wife reports patient with persistent cough and shortness of breath. Patient is followed by pulmonology, last seen on 4/26. Patient has decreased appetite. Recalls one slice bacon and one peanut butter cracker this morning. Patient taking sips of water with medication. He is drinking 2 Boost. Wife asking how many he can have in a day. He has been sleeping more. Denies nausea, vomiting, diarrhea, constipation.    Medications: reviewed   Labs: reviewed   Anthropometrics: Last weight 182 lb on 4/26 - stable   4/15 - 183 lb 11.2 oz 2/28 - 185 lb 12.8 oz 1/02 - 182 lb 14.4 oz    NUTRITION DIAGNOSIS: Unintended weight loss - suspect this is ongoing   INTERVENTION:  Encouraged soft moist high protein foods for ease of intake - handout with ideas mailed  Discussed ways to add calories and protein to foods, offered ideas - handout with shake recipes mailed  Suggested increasing Boost 3-4 daily as tolerated   MONITORING, EVALUATION, GOAL: weight trends, intake, plan   NEXT VISIT: To be determined with treatment plan

## 2022-12-13 NOTE — Telephone Encounter (Signed)
Okay with me 

## 2022-12-14 DIAGNOSIS — G894 Chronic pain syndrome: Secondary | ICD-10-CM | POA: Diagnosis not present

## 2022-12-14 DIAGNOSIS — I10 Essential (primary) hypertension: Secondary | ICD-10-CM | POA: Diagnosis not present

## 2022-12-16 ENCOUNTER — Ambulatory Visit (HOSPITAL_COMMUNITY)
Admission: RE | Admit: 2022-12-16 | Discharge: 2022-12-16 | Disposition: A | Discharge status: Home / Self Care | Payer: Medicare Other | Source: Ambulatory Visit | Attending: Hematology | Admitting: Hematology

## 2022-12-16 ENCOUNTER — Other Ambulatory Visit: Payer: Self-pay | Admitting: *Deleted

## 2022-12-16 ENCOUNTER — Telehealth: Payer: Self-pay | Admitting: *Deleted

## 2022-12-16 DIAGNOSIS — C771 Secondary and unspecified malignant neoplasm of intrathoracic lymph nodes: Secondary | ICD-10-CM

## 2022-12-16 DIAGNOSIS — C679 Malignant neoplasm of bladder, unspecified: Secondary | ICD-10-CM

## 2022-12-16 DIAGNOSIS — J90 Pleural effusion, not elsewhere classified: Secondary | ICD-10-CM

## 2022-12-16 DIAGNOSIS — J9811 Atelectasis: Secondary | ICD-10-CM | POA: Diagnosis not present

## 2022-12-16 MED ORDER — IOHEXOL 300 MG/ML  SOLN
75.0000 mL | Freq: Once | INTRAMUSCULAR | Status: AC | PRN
  Administered 2022-12-16: 75 mL via INTRAVENOUS

## 2022-12-16 NOTE — Telephone Encounter (Addendum)
Patient's wife called to advise that tessalon and antibiotic have not been effective.  Complaining of cough, sob, white frothy sputum, weakness.  Denies fever.  Will consult with Dr. Ellin Saba with next steps.  Per Dr. Ellin Saba, patient scheduled for STAT CT of the chest and to come to radiology now.  Verbalized understanding.

## 2022-12-17 ENCOUNTER — Other Ambulatory Visit: Payer: Self-pay | Admitting: *Deleted

## 2022-12-17 ENCOUNTER — Other Ambulatory Visit: Payer: Self-pay

## 2022-12-17 DIAGNOSIS — J90 Pleural effusion, not elsewhere classified: Secondary | ICD-10-CM

## 2022-12-17 NOTE — Progress Notes (Signed)
Patient's wife called clinic reporting that patient wasn't feeling any better.  He is complaining of cough and shortness of breath.  She was asking specifically about any treatments for this.  Dr. Ellin Saba aware and orders given for right thoracentesis.  Patients wife aware and she was provided with the number for central scheduling.  She can call and schedule procedure for a convenient time. She verbalizes understanding and has no further questions.

## 2022-12-20 ENCOUNTER — Inpatient Hospital Stay (HOSPITAL_COMMUNITY)
Admission: EM | Admit: 2022-12-20 | Discharge: 2022-12-24 | DRG: 193 | Disposition: A | Discharge status: Home / Self Care | Payer: Medicare Other | Attending: Family Medicine | Admitting: Family Medicine

## 2022-12-20 ENCOUNTER — Other Ambulatory Visit: Payer: Self-pay

## 2022-12-20 ENCOUNTER — Ambulatory Visit (HOSPITAL_COMMUNITY)
Admission: RE | Admit: 2022-12-20 | Discharge: 2022-12-20 | Disposition: A | Discharge status: Home / Self Care | Payer: Medicare Other | Source: Ambulatory Visit | Attending: Student | Admitting: Student

## 2022-12-20 ENCOUNTER — Emergency Department (HOSPITAL_COMMUNITY): Payer: Medicare Other

## 2022-12-20 ENCOUNTER — Encounter (HOSPITAL_COMMUNITY): Payer: Self-pay | Admitting: Family Medicine

## 2022-12-20 ENCOUNTER — Encounter (HOSPITAL_COMMUNITY): Payer: Self-pay

## 2022-12-20 ENCOUNTER — Ambulatory Visit (HOSPITAL_COMMUNITY)
Admission: RE | Admit: 2022-12-20 | Discharge: 2022-12-20 | Disposition: A | Discharge status: Home / Self Care | Payer: Medicare Other | Source: Ambulatory Visit | Attending: Hematology | Admitting: Hematology

## 2022-12-20 DIAGNOSIS — R531 Weakness: Secondary | ICD-10-CM | POA: Diagnosis not present

## 2022-12-20 DIAGNOSIS — Z881 Allergy status to other antibiotic agents status: Secondary | ICD-10-CM | POA: Diagnosis not present

## 2022-12-20 DIAGNOSIS — Z79899 Other long term (current) drug therapy: Secondary | ICD-10-CM | POA: Diagnosis not present

## 2022-12-20 DIAGNOSIS — I480 Paroxysmal atrial fibrillation: Secondary | ICD-10-CM | POA: Diagnosis present

## 2022-12-20 DIAGNOSIS — Z888 Allergy status to other drugs, medicaments and biological substances status: Secondary | ICD-10-CM

## 2022-12-20 DIAGNOSIS — C679 Malignant neoplasm of bladder, unspecified: Secondary | ICD-10-CM | POA: Diagnosis present

## 2022-12-20 DIAGNOSIS — Z923 Personal history of irradiation: Secondary | ICD-10-CM | POA: Diagnosis not present

## 2022-12-20 DIAGNOSIS — J9601 Acute respiratory failure with hypoxia: Secondary | ICD-10-CM

## 2022-12-20 DIAGNOSIS — J189 Pneumonia, unspecified organism: Principal | ICD-10-CM | POA: Diagnosis present

## 2022-12-20 DIAGNOSIS — J90 Pleural effusion, not elsewhere classified: Secondary | ICD-10-CM | POA: Diagnosis present

## 2022-12-20 DIAGNOSIS — C7800 Secondary malignant neoplasm of unspecified lung: Secondary | ICD-10-CM | POA: Diagnosis present

## 2022-12-20 DIAGNOSIS — Z906 Acquired absence of other parts of urinary tract: Secondary | ICD-10-CM | POA: Diagnosis not present

## 2022-12-20 DIAGNOSIS — Z8551 Personal history of malignant neoplasm of bladder: Secondary | ICD-10-CM | POA: Diagnosis not present

## 2022-12-20 DIAGNOSIS — Z811 Family history of alcohol abuse and dependence: Secondary | ICD-10-CM

## 2022-12-20 DIAGNOSIS — Z936 Other artificial openings of urinary tract status: Secondary | ICD-10-CM | POA: Diagnosis not present

## 2022-12-20 DIAGNOSIS — N400 Enlarged prostate without lower urinary tract symptoms: Secondary | ICD-10-CM | POA: Diagnosis not present

## 2022-12-20 DIAGNOSIS — Z87891 Personal history of nicotine dependence: Secondary | ICD-10-CM

## 2022-12-20 DIAGNOSIS — J440 Chronic obstructive pulmonary disease with acute lower respiratory infection: Secondary | ICD-10-CM | POA: Diagnosis present

## 2022-12-20 DIAGNOSIS — C7911 Secondary malignant neoplasm of bladder: Secondary | ICD-10-CM | POA: Diagnosis not present

## 2022-12-20 DIAGNOSIS — C7951 Secondary malignant neoplasm of bone: Secondary | ICD-10-CM | POA: Diagnosis present

## 2022-12-20 DIAGNOSIS — Z7901 Long term (current) use of anticoagulants: Secondary | ICD-10-CM | POA: Diagnosis not present

## 2022-12-20 DIAGNOSIS — K219 Gastro-esophageal reflux disease without esophagitis: Secondary | ICD-10-CM | POA: Diagnosis present

## 2022-12-20 DIAGNOSIS — I509 Heart failure, unspecified: Secondary | ICD-10-CM | POA: Diagnosis not present

## 2022-12-20 DIAGNOSIS — R0602 Shortness of breath: Secondary | ICD-10-CM | POA: Diagnosis not present

## 2022-12-20 DIAGNOSIS — R5383 Other fatigue: Secondary | ICD-10-CM | POA: Diagnosis not present

## 2022-12-20 LAB — CBC WITH DIFFERENTIAL/PLATELET
Abs Immature Granulocytes: 0.03 10*3/uL (ref 0.00–0.07)
Basophils Absolute: 0 10*3/uL (ref 0.0–0.1)
Basophils Relative: 1 %
Eosinophils Absolute: 0 10*3/uL (ref 0.0–0.5)
Eosinophils Relative: 0 %
HCT: 41.2 % (ref 39.0–52.0)
Hemoglobin: 13.6 g/dL (ref 13.0–17.0)
Immature Granulocytes: 1 %
Lymphocytes Relative: 6 %
Lymphs Abs: 0.4 10*3/uL — ABNORMAL LOW (ref 0.7–4.0)
MCH: 35.6 pg — ABNORMAL HIGH (ref 26.0–34.0)
MCHC: 33 g/dL (ref 30.0–36.0)
MCV: 107.9 fL — ABNORMAL HIGH (ref 80.0–100.0)
Monocytes Absolute: 1 10*3/uL (ref 0.1–1.0)
Monocytes Relative: 15 %
Neutro Abs: 5.1 10*3/uL (ref 1.7–7.7)
Neutrophils Relative %: 77 %
Platelets: 282 10*3/uL (ref 150–400)
RBC: 3.82 MIL/uL — ABNORMAL LOW (ref 4.22–5.81)
RDW: 12.4 % (ref 11.5–15.5)
WBC: 6.5 10*3/uL (ref 4.0–10.5)
nRBC: 0 % (ref 0.0–0.2)

## 2022-12-20 LAB — URINALYSIS, W/ REFLEX TO CULTURE (INFECTION SUSPECTED)
Bilirubin Urine: NEGATIVE
Glucose, UA: NEGATIVE mg/dL
Hgb urine dipstick: NEGATIVE
Ketones, ur: NEGATIVE mg/dL
Leukocytes,Ua: NEGATIVE
Nitrite: NEGATIVE
Protein, ur: NEGATIVE mg/dL
Specific Gravity, Urine: 1.014 (ref 1.005–1.030)
pH: 6 (ref 5.0–8.0)

## 2022-12-20 LAB — COMPREHENSIVE METABOLIC PANEL
ALT: 27 U/L (ref 0–44)
AST: 27 U/L (ref 15–41)
Albumin: 2.8 g/dL — ABNORMAL LOW (ref 3.5–5.0)
Alkaline Phosphatase: 55 U/L (ref 38–126)
Anion gap: 10 (ref 5–15)
BUN: 21 mg/dL (ref 8–23)
CO2: 28 mmol/L (ref 22–32)
Calcium: 8.8 mg/dL — ABNORMAL LOW (ref 8.9–10.3)
Chloride: 98 mmol/L (ref 98–111)
Creatinine, Ser: 1.08 mg/dL (ref 0.61–1.24)
GFR, Estimated: >60 mL/min (ref >60)
Glucose, Bld: 100 mg/dL — ABNORMAL HIGH (ref 70–99)
Potassium: 4.5 mmol/L (ref 3.5–5.1)
Sodium: 136 mmol/L (ref 135–145)
Total Bilirubin: 1 mg/dL (ref 0.3–1.2)
Total Protein: 6.5 g/dL (ref 6.5–8.1)

## 2022-12-20 LAB — CULTURE, BLOOD (ROUTINE X 2)

## 2022-12-20 LAB — TROPONIN I (HIGH SENSITIVITY)
Troponin I (High Sensitivity): 3 ng/L (ref <18)
Troponin I (High Sensitivity): 3 ng/L (ref <18)

## 2022-12-20 LAB — GLUCOSE, CAPILLARY: Glucose-Capillary: 132 mg/dL — ABNORMAL HIGH (ref 70–99)

## 2022-12-20 LAB — LACTIC ACID, PLASMA: Lactic Acid, Venous: 1.1 mmol/L (ref 0.5–1.9)

## 2022-12-20 LAB — MAGNESIUM: Magnesium: 2.5 mg/dL — ABNORMAL HIGH (ref 1.7–2.4)

## 2022-12-20 LAB — URINE CULTURE

## 2022-12-20 MED ORDER — AZITHROMYCIN 250 MG PO TABS
500.0000 mg | ORAL_TABLET | Freq: Once | ORAL | Status: AC
  Administered 2022-12-20: 500 mg via ORAL
  Filled 2022-12-20: qty 2

## 2022-12-20 MED ORDER — PANTOPRAZOLE SODIUM 40 MG PO TBEC
40.0000 mg | DELAYED_RELEASE_TABLET | Freq: Every day | ORAL | Status: DC
  Administered 2022-12-21 – 2022-12-23 (×3): 40 mg via ORAL
  Filled 2022-12-20 (×3): qty 1

## 2022-12-20 MED ORDER — SODIUM CHLORIDE 0.9 % IV BOLUS
500.0000 mL | Freq: Once | INTRAVENOUS | Status: AC
  Administered 2022-12-20: 500 mL via INTRAVENOUS

## 2022-12-20 MED ORDER — LIDOCAINE HCL (PF) 2 % IJ SOLN
10.0000 mL | Freq: Once | INTRAMUSCULAR | Status: AC
  Administered 2022-12-20: 10 mL

## 2022-12-20 MED ORDER — OXYCODONE HCL 5 MG PO TABS: 5.0000 mg | ORAL_TABLET | ORAL | Status: DC | PRN

## 2022-12-20 MED ORDER — METOPROLOL TARTRATE 25 MG PO TABS
ORAL_TABLET | ORAL | Status: AC
  Filled 2022-12-20: qty 1

## 2022-12-20 MED ORDER — IPRATROPIUM-ALBUTEROL 0.5-2.5 (3) MG/3ML IN SOLN
3.0000 mL | Freq: Once | RESPIRATORY_TRACT | Status: AC
  Administered 2022-12-20: 3 mL via RESPIRATORY_TRACT
  Filled 2022-12-20: qty 3

## 2022-12-20 MED ORDER — IOHEXOL 350 MG/ML SOLN
75.0000 mL | Freq: Once | INTRAVENOUS | Status: AC | PRN
  Administered 2022-12-20: 75 mL via INTRAVENOUS

## 2022-12-20 MED ORDER — SODIUM CHLORIDE 0.9 % IV SOLN
500.0000 mg | INTRAVENOUS | Status: DC
  Administered 2022-12-21 – 2022-12-24 (×4): 500 mg via INTRAVENOUS
  Filled 2022-12-20 (×5): qty 5

## 2022-12-20 MED ORDER — METOPROLOL TARTRATE 25 MG PO TABS
25.0000 mg | ORAL_TABLET | Freq: Two times a day (BID) | ORAL | Status: DC
  Administered 2022-12-20 – 2022-12-24 (×4): 25 mg via ORAL
  Filled 2022-12-20 (×7): qty 1

## 2022-12-20 MED ORDER — SODIUM CHLORIDE 0.9 % IV SOLN
1.0000 g | Freq: Once | INTRAVENOUS | Status: AC
  Administered 2022-12-20: 1 g via INTRAVENOUS
  Filled 2022-12-20: qty 10

## 2022-12-20 MED ORDER — SODIUM CHLORIDE 0.9 % IV SOLN
1.0000 g | INTRAVENOUS | Status: DC
  Administered 2022-12-21 – 2022-12-24 (×4): 1 g via INTRAVENOUS
  Filled 2022-12-20 (×4): qty 10

## 2022-12-20 NOTE — ED Notes (Addendum)
Pt saw DR Adewale A . Olalere  at [facility]. ordered an echo for 01/06/23.  Pt is a cancer pt and on blood thinners.

## 2022-12-20 NOTE — Procedures (Signed)
PROCEDURE SUMMARY:  Successful US guided right thoracentesis. Yielded 1.8 liters of clear, dark yellow fluid. Pt tolerated procedure well. No immediate complications.  Patient with O2 sats 88-91 during procedure and did not improve with removal of fluid.  BP stable 102/54 at end of procedure.  Patient reports general malaise, weakness, and requests transfer to the ED for further evaluation.   Specimen was not sent for labs. Sample set aside if needed.  CXR ordered.  EBL < 5 mL  Hoyt Koch PA-C 12/20/2022 10:31 AM

## 2022-12-20 NOTE — Progress Notes (Signed)
Patient tolerated right sided thoracentesis procedure well today and 1.8 Liters of clear yellow pleural fluid removed. Patient taken to chest xray post procedure at this time via wheelchair with no acute distress noted. Patient verbalized understanding of discharge instruction. Patient and wife requesting to be evaluated in the ED due to generalized weakness, cough and shortness of breath worsening over the past month and unchanged by recent antibiotics.

## 2022-12-20 NOTE — ED Provider Notes (Signed)
[place] EMERGENCY DEPARTMENT AT [facility] Provider Note   CSN: [id number] Arrival date & time: 12/20/22  1042     History  Chief Complaint  Patient presents with   Fatigue    [name] is a 82 y.o. male.  HPI      [name] is a 82 y.o. male with past medical history of stage IV bladder cancer with metastatic disease to the bones and lungs, COPD, paroxysmal atrial fibrillation, (anticoagulated on apixaban) GERD who presents to the Emergency Department complaining of generalized weakness, persistent shortness of breath, decreased appetite and cough.  He was here this morning for scheduled right-sided thoracentesis ordered by his oncologist, Dr. Ellin Saba.  He had 1.8 L of fluid removed from right pleural effusion.  States he is scheduled to have another thoracentesis of the left tomorrow.  Reports some improvement of his shortness of breath since having the thoracentesis today.  Recently completed course of Augmentin without improvement.  Cough has been mostly nonproductive.  He endorses dark urine, denies fever, chills, chest or abdominal pain, nausea vomiting or diarrhea.   Home Medications Prior to Admission medications   Medication Sig Start Date End Date Taking? Authorizing Provider  acetaminophen (TYLENOL) 500 MG tablet Take 1,000 mg by mouth every 6 (six) hours as needed for mild pain, fever, moderate pain or headache.   Yes [provider]  apixaban (ELIQUIS) 5 MG TABS tablet Take 1 tablet (5 mg total) by mouth 2 (two) times daily. 04/17/22  Yes Johnson, Clanford L, MD  metoprolol tartrate (LOPRESSOR) 25 MG tablet Take 1 tablet (25 mg total) by mouth 2 (two) times daily. 04/17/22  Yes Johnson, Clanford L, MD  pantoprazole (PROTONIX) 40 MG tablet Take 1 tablet (40 mg total) by mouth daily. 04/17/22  Yes Johnson, Clanford L, MD  amoxicillin-clavulanate (AUGMENTIN) 875-125 MG tablet Take 1 tablet by mouth 2 (two) times daily. Patient not taking:  Reported on 12/20/2022 12/08/22   Doreatha Massed, MD  benzonatate (TESSALON) 200 MG capsule Take 1 capsule (200 mg total) by mouth 3 (three) times daily as needed for cough. Patient not taking: Reported on 12/20/2022 12/08/22   Doreatha Massed, MD  methenamine (HIPREX) 1 g tablet Take 1 tablet (1 g total) by mouth 2 (two) times daily with a meal. Patient not taking: Reported on 12/20/2022 09/17/22   Malen Gauze, MD  oxyCODONE (OXY IR/ROXICODONE) 5 MG immediate release tablet Take 1 tablet (5 mg total) by mouth every 4 (four) hours as needed for severe pain. Patient not taking: Reported on 12/20/2022 11/30/22   Doreatha Massed, MD      Allergies    Ciprofloxacin and Zofran [ondansetron]    Review of Systems   Review of Systems  Constitutional:  Positive for appetite change and fatigue. Negative for chills and fever.  HENT:  Negative for congestion.   Respiratory:  Positive for cough and shortness of breath.   Cardiovascular:  Negative for chest pain, palpitations and leg swelling.  Gastrointestinal:  Negative for abdominal pain, nausea and vomiting.  Musculoskeletal:  Negative for back pain.  Neurological:  Positive for weakness (Generalized weakness). Negative for dizziness and headaches.  Psychiatric/Behavioral:  Negative for confusion.     Physical Exam Updated Vital Signs BP 104/69   Pulse 91   Temp (!) 97.4 F (36.3 C) (Oral)   Resp (!) 22   Ht 5\' 11"  (1.803 m)   Wt 82.5 kg   SpO2 91%   BMI 25.37  kg/m  Physical Exam Vitals and nursing note reviewed.  Constitutional:      General: He is not in acute distress.    Appearance: He is ill-appearing. He is not toxic-appearing.  HENT:     Mouth/Throat:     Mouth: Mucous membranes are moist.     Pharynx: Oropharynx is clear.  Neck:     Vascular: No JVD.  Cardiovascular:     Rate and Rhythm: Regular rhythm. Tachycardia present.     Pulses: Normal pulses.  Pulmonary:     Effort: Pulmonary effort is normal.      Breath sounds: Rhonchi present.  Abdominal:     General: There is no distension.     Palpations: Abdomen is soft.     Tenderness: There is no abdominal tenderness.  Musculoskeletal:        General: Normal range of motion.     Cervical back: Normal range of motion. No tenderness.     Right lower leg: No edema.     Left lower leg: No edema.  Skin:    Capillary Refill: Capillary refill takes less than 2 seconds.  Neurological:     General: No focal deficit present.     Mental Status: He is alert.     Sensory: No sensory deficit.     Motor: No weakness.     ED Results / Procedures / Treatments   Labs (all labs ordered are listed, but only abnormal results are displayed) Labs Reviewed  CBC WITH DIFFERENTIAL/PLATELET - Abnormal; Notable for the following components:      Result Value   RBC 3.82 (*)    MCV 107.9 (*)    MCH 35.6 (*)    Lymphs Abs 0.4 (*)    All other components within normal limits  COMPREHENSIVE METABOLIC PANEL - Abnormal; Notable for the following components:   Glucose, Bld 100 (*)    Calcium 8.8 (*)    Albumin 2.8 (*)    All other components within normal limits  MAGNESIUM - Abnormal; Notable for the following components:   Magnesium 2.5 (*)    All other components within normal limits  URINALYSIS, W/ REFLEX TO CULTURE (INFECTION SUSPECTED) - Abnormal; Notable for the following components:   APPearance HAZY (*)    Bacteria, UA MANY (*)    All other components within normal limits  URINE CULTURE  LACTIC ACID, PLASMA  TROPONIN I (HIGH SENSITIVITY)  TROPONIN I (HIGH SENSITIVITY)    EKG EKG Interpretation  Date/Time:  Monday Dec 20 2022 13:26:32 EDT Ventricular Rate:  96 PR Interval:  172 QRS Duration: 82 QT Interval:  364 QTC Calculation: 460 R Axis:   2 Text Interpretation: Sinus rhythm Atrial premature complex Low voltage, precordial leads Abnormal R-wave progression, early transition Confirmed by Gloris Manchester ([id number]) on 12/20/2022 7:36:00  PM  Radiology CT Angio Chest PE W and/or Wo Contrast  Result Date: 12/20/2022 CLINICAL DATA:  Productive cough and worsening shortness of breath with worsening weakness. Status post thoracentesis EXAM: CT ANGIOGRAPHY CHEST WITH CONTRAST TECHNIQUE: Multidetector CT imaging of the chest was performed using the standard protocol during bolus administration of intravenous contrast. Multiplanar CT image reconstructions and MIPs were obtained to evaluate the vascular anatomy. RADIATION DOSE REDUCTION: This exam was performed according to the departmental dose-optimization program which includes automated exposure control, adjustment of the mA and/or kV according to patient size and/or use of iterative reconstruction technique. CONTRAST:  75mL OMNIPAQUE IOHEXOL 350 MG/ML SOLN COMPARISON:  X-ray earlier 12/20/2022. Chest  CT with contrast 12/16/2022. Older exams as well. FINDINGS: Cardiovascular: Heart is nonenlarged. Trace pericardial fluid. Normal caliber thoracic aorta with mild vascular calcifications. Coronary artery calcifications are seen. There is significant motion throughout the examination. This is nondiagnostic for small and peripheral emboli. No segmental or larger pulmonary embolism identified at this time. Mediastinum/Nodes: Small hiatal hernia. Normal caliber thoracic esophagus. Small thyroid gland. No specific abnormal lymph node enlargement seen in the axillary regions, hilum or mediastinum. Lungs/Pleura: Moderate left and small right pleural effusion identified with the adjacent lung opacities. The right effusion is significantly improved status post thoracentesis. Patchy ground-glass once again seen in the left upper lobe medial and perihilar. Similar distribution but slightly more confluence today. Associated areas of bronchial wall thickening in this location. There is also a left upper lobe anterior spiculated nodule which is stable. Series 5, image 75 this measures 11 x 7 mm. There is patchy  parenchymal opacities in the right upper lung extending to the apex and medially along the right hemithorax extending down to the hilum with narrowing of bronchi. Associated bronchial wall thickening. These areas are similar when adjusting for technique and with the decreased level of the effusion today. There is also some bronchial wall thickening in the right lower lobe but improved aeration to the right lower lobe of the lung status post thoracentesis. Upper Abdomen: The adrenal glands are incompletely included in the imaging field. Benign hepatic cysts. Musculoskeletal: Gynecomastia. Scattered degenerative changes along the spine. Old right-sided rib fractures. Review of the MIP images confirms the above findings. IMPRESSION: Improved right pleural effusion with a small residual status post thoracentesis. No pneumothorax. Persistent left-sided pleural effusion. Multifocal areas of parenchymal opacity are again noted. Areas in the upper lung zones has a similar distribution. Focus on the left is slightly more confluence, perihilar anteriorly. Separate spiculated 11 mm left upper lobe lung nodule. Recommend continued follow up based on prior workup. Significant breathing motion. No segmental or larger pulmonary embolism. Aortic Atherosclerosis (ICD10-I70.0) and Emphysema (ICD10-J43.9). Electronically Signed   By: Karen Kays M.D.   On: 12/20/2022 18:18   US THORACENTESIS ASP PLEURAL SPACE W/IMG GUIDE  Result Date: 12/20/2022 INDICATION: 82 year old male with history of metastatic bladder cancer. EXAM: ULTRASOUND GUIDED RIGHT THORACENTESIS MEDICATIONS: 10 mL 1% lidocaine COMPLICATIONS: None immediate. PROCEDURE: An ultrasound guided thoracentesis was thoroughly discussed with the patient and questions answered. The benefits, risks, alternatives and complications were also discussed. The patient understands and wishes to proceed with the procedure. Written consent was obtained. Ultrasound was performed to  localize and mark an adequate pocket of fluid in the right chest. The area was then prepped and draped in the normal sterile fashion. 1% Lidocaine was used for local anesthesia. Under ultrasound guidance a 6 Fr Safe-T-Centesis catheter was introduced. Thoracentesis was performed. The catheter was removed and a dressing applied. FINDINGS: A total of approximately 1.8 liters of dark yellow, clear fluid was removed. IMPRESSION: Successful ultrasound guided right thoracentesis yielding 1.8 liters of pleural fluid. Read by: Loyce Dys PA-C Electronically Signed   By: Ulyses Southward M.D.   On: 12/20/2022 10:58   DG Chest 1 View  Result Date: 12/20/2022 CLINICAL DATA:  Post thoracentesis EXAM: CHEST  1 VIEW COMPARISON:  11/29/2022 FINDINGS: Normal heart size, mediastinal contours, and pulmonary vascularity. Small LEFT pleural effusion and minimal LEFT basilar atelectasis. Lungs otherwise clear. No acute infiltrate or pneumothorax. Bones demineralized. IMPRESSION: No pneumothorax following thoracentesis. Electronically Signed   By: Angelyn Punt.D.  On: 12/20/2022 10:55    Procedures Procedures    Medications Ordered in ED Medications  sodium chloride 0.9 % bolus 500 mL (has no administration in time range)    ED Course/ Medical Decision Making/ A&P                             Medical Decision Making Patient here evaluation of generalized weakness, persistent cough, persistent shortness of breath and decreased appetite.  The symptoms have been going on for some time.  He underwent thoracentesis this morning of 4 right-sided pleural effusion, removed 1.8 L of fluid.  He notes a slight improvement of his dyspnea after the procedure.  Patient spouse who is at bedside notes hypoxic this morning with O2 sat of 76 on room air. He is had bouts of hypoxia here dropping into the upper 80s on room air he was placed on 2 L nasal oxygen maintaining sats in the mid to lower 90s.  Differential would include but  not limited to pneumonia, PE, viral process, worsening of his metastatic disease, CHF  Amount and/or Complexity of Data Reviewed Labs: ordered.    Details: Labs interpreted by me, no evidence of leukocytosis, hemoglobin unremarkable.  Lactic acid also unremarkable.  Chemistries without significant derangement.  Magnesium 2.5.  Troponin 3 Radiology: ordered.    Details: Chest x-ray without evidence of pneumothorax following thoracentesis.  This x-ray was performed prior to his ER arrival  CT angio of the chest shows improved right pleural effusion with a small residual status post thoracentesis no pneumothorax.  Persistent left-sided pleural effusion with multifocal areas of parenchymal opacity are again noted. ECG/medicine tests: ordered. Discussion of management or test interpretation with external provider(s): Patient here with out clear cause of his worsening hypoxia and cough.  Thoracentesis earlier today without significant improvement.  Has required supplemental oxygen today.  I have discussed findings with his oncologist, Dr. Ellin Saba.  CT angio of the chest was ordered for evaluation of possible PE.  No evidence of large PE but patient continues to have multifocal parenchymal opacities.  IV antibiotics were initiated.  Feel patient would benefit from hospital stay.  Dr. Ellin Saba agreeable to consult  Discussed findings with [facility] hospitalist, Dr. Sharl Ma who agrees to admit           Final Clinical Impression(s) / ED Diagnoses Final diagnoses:  Acute respiratory failure with hypoxia [facility])    Rx / DC Orders ED Discharge Orders     None         Rosey Bath 12/20/22 Wilmon Pali, MD 12/22/22 [id number]

## 2022-12-20 NOTE — ED Triage Notes (Addendum)
Patient brought over to ED for eval post thoracentesis today. Patient requesting ED evaluation due to increased generalized weakness, productive cough and SOB worsening over the past month unrelieved by recent antibiotics and cough medications ordered by Dr. Ellin Saba. Patient had 1.8 liters removed from right pleural effusion today.

## 2022-12-20 NOTE — H&P (Signed)
TRH H&P    Patient Demographics:    [name], is a 82 y.o. male  MRN: [id number]  DOB - Jul 11, 1941  Admit Date - 12/20/2022   Chief complaint-generalized weakness   HPI:    [name]  is a 82 y.o. male, with history of stage IV bladder cancer with metastasis to bones and lungs, s/p cystoprostatectomy, with lower quadrant ileal conduit formation, diverticulosis, atrial fibrillation with anticoagulation on apixaban, GERD who came to ED with generalized weakness, persistent shortness of breath and poor appetite.  Patient was seen in oncology clinic this morning where he underwent right sided thoracentesis ordered by oncologist Dr. Ellin Saba.  1.8 L fluid was removed from right pleural effusion.  Patient is scheduled to undergo left thoracentesis tomorrow.  Recently completed course of Augmentin without any improvement.  As per patient's wife, he was coughing last night and when she checked her pulse ox it was in the 70s.  In the ED he had bouts of hypoxia with O2 sats dropping to upper 80s.  He was placed on 2 L of oxygen via nasal cannula. He denies any fever or chills Denies nausea vomiting or diarrhea Denies abdominal pain or dysuria In the ED CT chest showed improved right pleural effusion with small residual s/p thoracentesis, no pneumothorax.  Persistent left-sided pleural effusion with multifocal areas of parenchymal opacity noted.  Patient was started on Rocephin and Zithromax.  No evidence of PE noted on CTA chest.     Review of systems:    In addition to the HPI above,    All other systems reviewed and are negative.    Past History of the following :    Past Medical History:  Diagnosis Date   Bladder cancer (HCC)    Bone cancer (HCC)    BPH (benign prostatic hypertrophy)    Dysuria    GERD (gastroesophageal reflux disease)    History of adenomatous polyp of colon    tubular adenoma 06/  2018   History of bladder cancer 12/2014   urologist-  dr Sherryl Barters--  dx High Grade TCC without stromal invasion s/p TURBT and intravesical BCG tx/  recurrent 07/2015 (pTa) TCC  repear intravesical BCG tx    History of hiatal hernia    History of urethral stricture    Nocturia       Past Surgical History:  Procedure Laterality Date   CARDIAC CATHETERIZATION  1997   normal coronary arteries   CARDIOVASCULAR STRESS TEST  12-14-2005   Low risk perfusion study/  very small scar in the inferior septum from mid ventricle to apex,  no ischemia/  mild inferior septal hypokinesis/  ef 58%   CATARACT EXTRACTION W/ INTRAOCULAR LENS  IMPLANT, BILATERAL  2011   COLONOSCOPY  last one 06/ 2018   CYSTOSCOPY WITH BIOPSY N/A 08/12/2015   Procedure: CYSTOSCOPY WITH BIOPSY AND FULGERATION;  Surgeon: Hildred Laser, MD;  Location: [facility];  Service: Urology;  Laterality: N/A;   CYSTOSCOPY WITH INJECTION N/A 06/29/2017   Procedure: CYSTOSCOPY WITH INJECTION OF  INDOCYANINE GREEN DYE;  Surgeon: Sebastian Ache, MD;  Location: [facility];  Service: Urology;  Laterality: N/A;   CYSTOSCOPY WITH URETHRAL DILATATION N/A 03/21/2017   Procedure: CYSTOSCOPY;  Surgeon: Hildred Laser, MD;  Location: [facility];  Service: Urology;  Laterality: N/A;   ESOPHAGOGASTRODUODENOSCOPY  12-05-2014   IR IMAGING GUIDED PORT INSERTION  02/07/2018   IR NEPHROSTOMY PLACEMENT LEFT  12/25/2021   IR REMOVAL TUN ACCESS W/ PORT W/O FL MOD SED  04/14/2018   TRANSTHORACIC ECHOCARDIOGRAM  01-31-2008   nomral LVF,  ef 55-65%/  mild pulmonic stenosis without regurg.,  peak transpulomonic valve gradient   TRANSURETHRAL RESECTION OF BLADDER TUMOR N/A 12/31/2014   Procedure: TRANSURETHRAL RESECTION OF BLADDER TUMOR (TURBT);  Surgeon: Su Grand, MD;  Location: [facility];  Service: Urology;  Laterality: N/A;   TRANSURETHRAL RESECTION OF BLADDER TUMOR N/A 03/21/2017   Procedure: POSSIBLE  TRANSURETHRAL RESECTION OF BLADDER TUMOR (TURBT);  Surgeon: Hildred Laser, MD;  Location: [facility];  Service: Urology;  Laterality: N/A;      Social History:      Social History   Tobacco Use   Smoking status: Former    Packs/day: 1.00    Years: 34.00    Additional pack years: 0.00    Total pack years: 34.00    Types: Cigarettes    Quit date: 12/26/1988    Years since quitting: 34.0   Smokeless tobacco: Never  Substance Use Topics   Alcohol use: Yes    Alcohol/week: 6.0 standard drinks of alcohol    Types: 6 Cans of beer per week    Comment: BEER       Family History :     Family History  Problem Relation Age of Onset   Alcoholism Father    Colon cancer Neg Hx    Colon polyps Neg Hx    Diabetes Neg Hx    Kidney disease Neg Hx    Esophageal cancer Neg Hx    Heart disease Neg Hx    Gallbladder disease Neg Hx    Allergic rhinitis Neg Hx    Angioedema Neg Hx    Asthma Neg Hx    Atopy Neg Hx    Eczema Neg Hx    Immunodeficiency Neg Hx    Urticaria Neg Hx       Home Medications:   Prior to Admission medications   Medication Sig Start Date End Date Taking? Authorizing Provider  acetaminophen (TYLENOL) 500 MG tablet Take 1,000 mg by mouth every 6 (six) hours as needed for mild pain, fever, moderate pain or headache.   Yes [provider]  apixaban (ELIQUIS) 5 MG TABS tablet Take 1 tablet (5 mg total) by mouth 2 (two) times daily. 04/17/22  Yes Johnson, Clanford L, MD  metoprolol tartrate (LOPRESSOR) 25 MG tablet Take 1 tablet (25 mg total) by mouth 2 (two) times daily. 04/17/22  Yes Johnson, Clanford L, MD  pantoprazole (PROTONIX) 40 MG tablet Take 1 tablet (40 mg total) by mouth daily. 04/17/22  Yes Johnson, Clanford L, MD  amoxicillin-clavulanate (AUGMENTIN) 875-125 MG tablet Take 1 tablet by mouth 2 (two) times daily. Patient not taking: Reported on 12/20/2022 12/08/22   Doreatha Massed, MD  benzonatate (TESSALON) 200 MG capsule Take  1 capsule (200 mg total) by mouth 3 (three) times daily as needed for cough. Patient not taking: Reported on 12/20/2022 12/08/22   Doreatha Massed, MD  methenamine (HIPREX) 1 g tablet Take  1 tablet (1 g total) by mouth 2 (two) times daily with a meal. Patient not taking: Reported on 12/20/2022 09/17/22   Malen Gauze, MD  oxyCODONE (OXY IR/ROXICODONE) 5 MG immediate release tablet Take 1 tablet (5 mg total) by mouth every 4 (four) hours as needed for severe pain. Patient not taking: Reported on 12/20/2022 11/30/22   Doreatha Massed, MD     Allergies:     Allergies  Allergen Reactions   Ciprofloxacin Hives   Zofran [Ondansetron] Rash     Physical Exam:   Vitals  Blood pressure 112/78, pulse (!) 102, temperature (!) 97.4 F (36.3 C), temperature source Oral, resp. rate (!) 22, height 5\' 11"  (1.803 m), weight 82.5 kg, SpO2 95 %.  1.  General: Appears in no acute distress  2. Psychiatric: Alert, oriented x 3, intact insight and judgment  3. Neurologic: Cranial nerves II through XII grossly intact, no focal deficit noted  4. HEENMT:  Atraumatic normocephalic, extraocular muscles are intact  5. Respiratory : Lungs are clear to auscultation bilaterally  6. Cardiovascular : S1-S2, regular, no murmur auscultated  7. Gastrointestinal:  Abdomen is soft, nontender, no organomegaly  8. Skin:  No rashes noted  9.Musculoskeletal:  No edema in the lower extremities    Data Review:    CBC Recent Labs  Lab 12/20/22 1345  WBC 6.5  HGB 13.6  HCT 41.2  PLT 282  MCV 107.9*  MCH 35.6*  MCHC 33.0  RDW 12.4  LYMPHSABS 0.4*  MONOABS 1.0  EOSABS 0.0  BASOSABS 0.0   ------------------------------------------------------------------------------------------------------------------  Results for orders placed or performed during the hospital encounter of 12/20/22 (from the past 48 hour(s))  Urinalysis, w/ Reflex to Culture (Infection Suspected) -Urine, Suprapubic      Status: Abnormal   Collection Time: 12/20/22  1:44 PM  Result Value Ref Range   Specimen Source URINE, SUPRAPUBIC    Color, Urine YELLOW YELLOW   APPearance HAZY (A) CLEAR   Specific Gravity, Urine 1.014 1.005 - 1.030   pH 6.0 5.0 - 8.0   Glucose, UA NEGATIVE NEGATIVE mg/dL   Hgb urine dipstick NEGATIVE NEGATIVE   Bilirubin Urine NEGATIVE NEGATIVE   Ketones, ur NEGATIVE NEGATIVE mg/dL   Protein, ur NEGATIVE NEGATIVE mg/dL   Nitrite NEGATIVE NEGATIVE   Leukocytes,Ua NEGATIVE NEGATIVE   RBC / HPF 0-5 0 - 5 RBC/hpf   WBC, UA 11-20 0 - 5 WBC/hpf    Comment:        Reflex urine culture not performed if WBC <=10, OR if Squamous epithelial cells >5. If Squamous epithelial cells >5 suggest recollection.    Bacteria, UA MANY (A) NONE SEEN   Squamous Epithelial / HPF 0-5 0 - 5 /HPF   Mucus PRESENT     Comment: Performed at [facility], [address] [place], [place] [address]  CBC with Differential     Status: Abnormal   Collection Time: 12/20/22  1:45 PM  Result Value Ref Range   WBC 6.5 4.0 - 10.5 K/uL   RBC 3.82 (L) 4.22 - 5.81 MIL/uL   Hemoglobin 13.6 13.0 - 17.0 g/dL   HCT 38.7 56.4 - 33.2 %   MCV 107.9 (H) 80.0 - 100.0 fL   MCH 35.6 (H) 26.0 - 34.0 pg   MCHC 33.0 30.0 - 36.0 g/dL   RDW 95.1 88.4 - 16.6 %   Platelets 282 150 - 400 K/uL   nRBC 0.0 0.0 - 0.2 %   Neutrophils Relative % 77 %  Neutro Abs 5.1 1.7 - 7.7 K/uL   Lymphocytes Relative 6 %   Lymphs Abs 0.4 (L) 0.7 - 4.0 K/uL   Monocytes Relative 15 %   Monocytes Absolute 1.0 0.1 - 1.0 K/uL   Eosinophils Relative 0 %   Eosinophils Absolute 0.0 0.0 - 0.5 K/uL   Basophils Relative 1 %   Basophils Absolute 0.0 0.0 - 0.1 K/uL   Immature Granulocytes 1 %   Abs Immature Granulocytes 0.03 0.00 - 0.07 K/uL    Comment: Performed at [facility], [address], [place], [place] [address]  Comprehensive metabolic panel     Status: Abnormal   Collection Time: 12/20/22  1:45 PM  Result Value Ref Range   Sodium 136 135 -  145 mmol/L   Potassium 4.5 3.5 - 5.1 mmol/L   Chloride 98 98 - 111 mmol/L   CO2 28 22 - 32 mmol/L   Glucose, Bld 100 (H) 70 - 99 mg/dL    Comment: Glucose reference range applies only to samples taken after fasting for at least 8 hours.   BUN 21 8 - 23 mg/dL   Creatinine, Ser 6.04 0.61 - 1.24 mg/dL   Calcium 8.8 (L) 8.9 - 10.3 mg/dL   Total Protein 6.5 6.5 - 8.1 g/dL   Albumin 2.8 (L) 3.5 - 5.0 g/dL   AST 27 15 - 41 U/L   ALT 27 0 - 44 U/L   Alkaline Phosphatase 55 38 - 126 U/L   Total Bilirubin 1.0 0.3 - 1.2 mg/dL   GFR, Estimated >54 >09 mL/min    Comment: (NOTE) Calculated using the CKD-EPI Creatinine Equation (2021)    Anion gap 10 5 - 15    Comment: Performed at [facility], [address] [place], [place] [address]  Troponin I (High Sensitivity)     Status: None   Collection Time: 12/20/22  1:45 PM  Result Value Ref Range   Troponin I (High Sensitivity) 3 <18 ng/L    Comment: (NOTE) Elevated high sensitivity troponin I (hsTnI) values and significant  changes across serial measurements may suggest ACS but many other  chronic and acute conditions are known to elevate hsTnI results.  Refer to the "Links" section for chest pain algorithms and additional  guidance. Performed at [facility], [address] [place], [place] [address]   Lactic acid, plasma     Status: None   Collection Time: 12/20/22  1:45 PM  Result Value Ref Range   Lactic Acid, Venous 1.1 0.5 - 1.9 mmol/L    Comment: Performed at [facility], [address] [place], [place] [address]  Magnesium     Status: Abnormal   Collection Time: 12/20/22  1:45 PM  Result Value Ref Range   Magnesium 2.5 (H) 1.7 - 2.4 mg/dL    Comment: Performed at [facility], [address], [place], [place] [address]  Troponin I (High Sensitivity)     Status: None   Collection Time: 12/20/22  3:37 PM  Result Value Ref Range   Troponin I (High Sensitivity) 3 <18 ng/L    Comment: (NOTE) Elevated high sensitivity troponin I  (hsTnI) values and significant  changes across serial measurements may suggest ACS but many other  chronic and acute conditions are known to elevate hsTnI results.  Refer to the "Links" section for chest pain algorithms and additional  guidance. Performed at [facility], [address], [place], [place] [address]     Chemistries  Recent Labs  Lab 12/20/22 1345  NA 136  K 4.5  CL 98  CO2 28  GLUCOSE 100*  BUN 21  CREATININE 1.08  CALCIUM 8.8*  MG 2.5*  AST 27  ALT 27  ALKPHOS 55  BILITOT 1.0   ------------------------------------------------------------------------------------------------------------------  ------------------------------------------------------------------------------------------------------------------ GFR: Estimated Creatinine Clearance: 57.1 mL/min (by C-G formula based on SCr of 1.08 mg/dL). Liver Function Tests: Recent Labs  Lab 12/20/22 1345  AST 27  ALT 27  ALKPHOS 55  BILITOT 1.0  PROT 6.5  ALBUMIN 2.8*   No results for input(s): "LIPASE", "AMYLASE" in the last 168 hours. No results for input(s): "AMMONIA" in the last 168 hours. Coagulation Profile: No results for input(s): "INR", "PROTIME" in the last 168 hours. Cardiac Enzymes: No results for input(s): "CKTOTAL", "CKMB", "CKMBINDEX", "TROPONINI" in the last 168 hours. BNP (last 3 results) Recent Labs    12/10/22 0940  PROBNP 27.0   HbA1C: No results for input(s): "HGBA1C" in the last 72 hours. CBG: No results for input(s): "GLUCAP" in the last 168 hours. Lipid Profile: No results for input(s): "CHOL", "HDL", "LDLCALC", "TRIG", "CHOLHDL", "LDLDIRECT" in the last 72 hours. Thyroid Function Tests: No results for input(s): "TSH", "T4TOTAL", "FREET4", "T3FREE", "THYROIDAB" in the last 72 hours. Anemia Panel: No results for input(s): "VITAMINB12", "FOLATE", "FERRITIN", "TIBC", "IRON", "RETICCTPCT" in the last 72  hours.  --------------------------------------------------------------------------------------------------------------- Urine analysis:    Component Value Date/Time   COLORURINE YELLOW 12/20/2022 1344   APPEARANCEUR HAZY (A) 12/20/2022 1344   APPEARANCEUR Clear 09/17/2022 1023   LABSPEC 1.014 12/20/2022 1344   PHURINE 6.0 12/20/2022 1344   GLUCOSEU NEGATIVE 12/20/2022 1344   HGBUR NEGATIVE 12/20/2022 1344   BILIRUBINUR NEGATIVE 12/20/2022 1344   BILIRUBINUR Negative 09/17/2022 1023   KETONESUR NEGATIVE 12/20/2022 1344   PROTEINUR NEGATIVE 12/20/2022 1344   NITRITE NEGATIVE 12/20/2022 1344   LEUKOCYTESUR NEGATIVE 12/20/2022 1344      Imaging Results:    CT Angio Chest PE W and/or Wo Contrast  Result Date: 12/20/2022 CLINICAL DATA:  Productive cough and worsening shortness of breath with worsening weakness. Status post thoracentesis EXAM: CT ANGIOGRAPHY CHEST WITH CONTRAST TECHNIQUE: Multidetector CT imaging of the chest was performed using the standard protocol during bolus administration of intravenous contrast. Multiplanar CT image reconstructions and MIPs were obtained to evaluate the vascular anatomy. RADIATION DOSE REDUCTION: This exam was performed according to the departmental dose-optimization program which includes automated exposure control, adjustment of the mA and/or kV according to patient size and/or use of iterative reconstruction technique. CONTRAST:  75mL OMNIPAQUE IOHEXOL 350 MG/ML SOLN COMPARISON:  X-ray earlier 12/20/2022. Chest CT with contrast 12/16/2022. Older exams as well. FINDINGS: Cardiovascular: Heart is nonenlarged. Trace pericardial fluid. Normal caliber thoracic aorta with mild vascular calcifications. Coronary artery calcifications are seen. There is significant motion throughout the examination. This is nondiagnostic for small and peripheral emboli. No segmental or larger pulmonary embolism identified at this time. Mediastinum/Nodes: Small hiatal hernia.  Normal caliber thoracic esophagus. Small thyroid gland. No specific abnormal lymph node enlargement seen in the axillary regions, hilum or mediastinum. Lungs/Pleura: Moderate left and small right pleural effusion identified with the adjacent lung opacities. The right effusion is significantly improved status post thoracentesis. Patchy ground-glass once again seen in the left upper lobe medial and perihilar. Similar distribution but slightly more confluence today. Associated areas of bronchial wall thickening in this location. There is also a left upper lobe anterior spiculated nodule which is stable. Series 5, image 75 this measures 11 x 7 mm. There is patchy parenchymal opacities in  the right upper lung extending to the apex and medially along the right hemithorax extending down to the hilum with narrowing of bronchi. Associated bronchial wall thickening. These areas are similar when adjusting for technique and with the decreased level of the effusion today. There is also some bronchial wall thickening in the right lower lobe but improved aeration to the right lower lobe of the lung status post thoracentesis. Upper Abdomen: The adrenal glands are incompletely included in the imaging field. Benign hepatic cysts. Musculoskeletal: Gynecomastia. Scattered degenerative changes along the spine. Old right-sided rib fractures. Review of the MIP images confirms the above findings. IMPRESSION: Improved right pleural effusion with a small residual status post thoracentesis. No pneumothorax. Persistent left-sided pleural effusion. Multifocal areas of parenchymal opacity are again noted. Areas in the upper lung zones has a similar distribution. Focus on the left is slightly more confluence, perihilar anteriorly. Separate spiculated 11 mm left upper lobe lung nodule. Recommend continued follow up based on prior workup. Significant breathing motion. No segmental or larger pulmonary embolism. Aortic Atherosclerosis (ICD10-I70.0)  and Emphysema (ICD10-J43.9). Electronically Signed   By: Karen Kays M.D.   On: 12/20/2022 18:18   US THORACENTESIS ASP PLEURAL SPACE W/IMG GUIDE  Result Date: 12/20/2022 INDICATION: 82 year old male with history of metastatic bladder cancer. EXAM: ULTRASOUND GUIDED RIGHT THORACENTESIS MEDICATIONS: 10 mL 1% lidocaine COMPLICATIONS: None immediate. PROCEDURE: An ultrasound guided thoracentesis was thoroughly discussed with the patient and questions answered. The benefits, risks, alternatives and complications were also discussed. The patient understands and wishes to proceed with the procedure. Written consent was obtained. Ultrasound was performed to localize and mark an adequate pocket of fluid in the right chest. The area was then prepped and draped in the normal sterile fashion. 1% Lidocaine was used for local anesthesia. Under ultrasound guidance a 6 Fr Safe-T-Centesis catheter was introduced. Thoracentesis was performed. The catheter was removed and a dressing applied. FINDINGS: A total of approximately 1.8 liters of dark yellow, clear fluid was removed. IMPRESSION: Successful ultrasound guided right thoracentesis yielding 1.8 liters of pleural fluid. Read by: Loyce Dys PA-C Electronically Signed   By: Ulyses Southward M.D.   On: 12/20/2022 10:58   DG Chest 1 View  Result Date: 12/20/2022 CLINICAL DATA:  Post thoracentesis EXAM: CHEST  1 VIEW COMPARISON:  11/29/2022 FINDINGS: Normal heart size, mediastinal contours, and pulmonary vascularity. Small LEFT pleural effusion and minimal LEFT basilar atelectasis. Lungs otherwise clear. No acute infiltrate or pneumothorax. Bones demineralized. IMPRESSION: No pneumothorax following thoracentesis. Electronically Signed   By: Ulyses Southward M.D.   On: 12/20/2022 10:55    My personal review of EKG: Rhythm NSR, no ST-T changes   Assessment & Plan:    Principal Problem:   CAP (community acquired pneumonia)   Acute hypoxemic respiratory failure-multifactorial  from bilateral pleural effusion, underwent right-sided thoracentesis today, scheduled to undergo left-sided thoracentesis tomorrow morning.  Also found to have multifocal areas of parenchymal opacities in the lung.  Patient started on IV ceftriaxone and Zithromax.  Will obtain blood cultures x 2 Multifocal pneumonia-seen on CTA chest.  Started on ceftriaxone and Zithromax as above. Bilateral pleural effusion-s/p right-sided thoracentesis today yielding 1.8 L of clear dark yellow fluid.  Patient was supposed to undergo left-sided thoracentesis per IR tomorrow.  Will order IR guided thoracentesis from left pleural effusion.  Can send labs for further evaluation. Atrial fibrillation-continue Lopressor which patient takes at home.  Hold apixaban for possible thoracentesis in AM. Stage IV bladder cancer with mets  to bone-s/p cystectomy and ileal conduit.  Followed by oncology and urology as outpatient.    DVT Prophylaxis-   apixaban, on hold for thoracentesis in a.m.  AM Labs Ordered, also please review Full Orders  Family Communication: Admission, patients condition and plan of care including tests being ordered have been discussed with the patient and his wife at bedside* who indicate understanding and agree with the plan and Code Status.  Code Status: Full code  Admission status: Observation/Inpatient :The appropriate admission status for this patient is INPATIENT. Inpatient status is judged to be reasonable and necessary in order to provide the required intensity of service to ensure the patient's safety. The patient's presenting symptoms, physical exam findings, and initial radiographic and laboratory data in the context of their chronic comorbidities is felt to place them at high risk for further clinical deterioration. Furthermore, it is not anticipated that the patient will be medically stable for discharge from the hospital within 2 midnights of admission. The following factors support the  admission status of inpatient.    Time spent in minutes : 60 minutes   Tiarna Koppen S Lannie Yusuf M.D

## 2022-12-21 ENCOUNTER — Inpatient Hospital Stay (HOSPITAL_COMMUNITY): Payer: Medicare Other

## 2022-12-21 ENCOUNTER — Encounter (HOSPITAL_COMMUNITY): Payer: Self-pay | Admitting: Family Medicine

## 2022-12-21 DIAGNOSIS — J189 Pneumonia, unspecified organism: Secondary | ICD-10-CM | POA: Diagnosis not present

## 2022-12-21 LAB — COMPREHENSIVE METABOLIC PANEL
ALT: 21 U/L (ref 0–44)
AST: 22 U/L (ref 15–41)
Albumin: 2.4 g/dL — ABNORMAL LOW (ref 3.5–5.0)
Alkaline Phosphatase: 45 U/L (ref 38–126)
Anion gap: 8 (ref 5–15)
BUN: 17 mg/dL (ref 8–23)
CO2: 26 mmol/L (ref 22–32)
Calcium: 8.5 mg/dL — ABNORMAL LOW (ref 8.9–10.3)
Chloride: 101 mmol/L (ref 98–111)
Creatinine, Ser: 1 mg/dL (ref 0.61–1.24)
GFR, Estimated: >60 mL/min (ref >60)
Glucose, Bld: 93 mg/dL (ref 70–99)
Potassium: 4.1 mmol/L (ref 3.5–5.1)
Sodium: 135 mmol/L (ref 135–145)
Total Bilirubin: 0.8 mg/dL (ref 0.3–1.2)
Total Protein: 5.3 g/dL — ABNORMAL LOW (ref 6.5–8.1)

## 2022-12-21 LAB — BODY FLUID CELL COUNT WITH DIFFERENTIAL
Eos, Fluid: 0 %
Lymphs, Fluid: 94 %
Monocyte-Macrophage-Serous Fluid: 5 % — ABNORMAL LOW (ref 50–90)
Neutrophil Count, Fluid: 1 % (ref 0–25)
Total Nucleated Cell Count, Fluid: 2193 cu mm — ABNORMAL HIGH (ref 0–1000)

## 2022-12-21 LAB — BLOOD GAS, ARTERIAL
Acid-Base Excess: 3.5 mmol/L — ABNORMAL HIGH (ref 0.0–2.0)
Bicarbonate: 27.8 mmol/L (ref 20.0–28.0)
Drawn by: 21310
FIO2: 28 %
O2 Saturation: 95.4 %
Patient temperature: 37
pCO2 arterial: 40 mmHg (ref 32–48)
pH, Arterial: 7.45 (ref 7.35–7.45)
pO2, Arterial: 73 mmHg — ABNORMAL LOW (ref 83–108)

## 2022-12-21 LAB — CBC
HCT: 38 % — ABNORMAL LOW (ref 39.0–52.0)
Hemoglobin: 12.4 g/dL — ABNORMAL LOW (ref 13.0–17.0)
MCH: 35 pg — ABNORMAL HIGH (ref 26.0–34.0)
MCHC: 32.6 g/dL (ref 30.0–36.0)
MCV: 107.3 fL — ABNORMAL HIGH (ref 80.0–100.0)
Platelets: 239 10*3/uL (ref 150–400)
RBC: 3.54 MIL/uL — ABNORMAL LOW (ref 4.22–5.81)
RDW: 12.3 % (ref 11.5–15.5)
WBC: 6.3 10*3/uL (ref 4.0–10.5)
nRBC: 0 % (ref 0.0–0.2)

## 2022-12-21 LAB — URINE CULTURE

## 2022-12-21 LAB — GRAM STAIN: Gram Stain: NONE SEEN

## 2022-12-21 LAB — PROTEIN, PLEURAL OR PERITONEAL FLUID: Total protein, fluid: 3.3 g/dL

## 2022-12-21 LAB — CULTURE, BLOOD (ROUTINE X 2): Special Requests: ADEQUATE

## 2022-12-21 MED ORDER — APIXABAN 5 MG PO TABS
5.0000 mg | ORAL_TABLET | Freq: Two times a day (BID) | ORAL | Status: DC
  Administered 2022-12-21 – 2022-12-24 (×7): 5 mg via ORAL
  Filled 2022-12-21 (×7): qty 1

## 2022-12-21 MED ORDER — LIDOCAINE HCL (PF) 2 % IJ SOLN
10.0000 mL | Freq: Once | INTRAMUSCULAR | Status: AC
  Administered 2022-12-21: 10 mL

## 2022-12-21 NOTE — Progress Notes (Signed)
   12/21/22 1100  Assess: MEWS Score  Temp 98.6 F (37 C)  BP (!) 107/57  MAP (mmHg) 72  Pulse Rate (!) 109  Resp (!) 36 (RN AWARE AND THE RED MEWS)  SpO2 93 %  O2 Device Room Air  Assess: MEWS Score  MEWS Temp 0  MEWS Systolic 0  MEWS Pulse 1  MEWS RR 3  MEWS LOC 0  MEWS Score 4  MEWS Score Color Red  Assess: SIRS CRITERIA  SIRS Temperature  0  SIRS Pulse 1  SIRS Respirations  1  SIRS WBC 0  SIRS Score Sum  2   Pt assessed, O2 sat 93% on RA however pt continues to breath short and rapidly. Pt states he does not feel in distress, laying calmly in bed at this time with wife at bedside. 2L [place] applied for comfort. Will continue to monitor vs at this time.

## 2022-12-21 NOTE — Progress Notes (Signed)
  Transition of Care [facility]) Screening Note   Patient Details  Name: [name] Date of Birth: Jun 26, 1941   Transition of Care [facility]) CM/SW Contact:    Villa Herb, LCSWA Phone Number: 12/21/2022, 10:00 AM    Transition of Care Department [facility]) has reviewed patient and no TOC needs have been identified at this time. We will continue to monitor patient advancement through interdisciplinary progression rounds. If new patient transition needs arise, please place a TOC consult.

## 2022-12-21 NOTE — Progress Notes (Signed)
PROGRESS NOTE    [name]  ZOX:[id number] DOB: 03-Jul-1941 DOA: 12/20/2022 PCP: Elfredia Nevins, MD   Brief Narrative:    [name]  is a 82 y.o. male, with history of stage IV bladder cancer with metastasis to bones and lungs, s/p cystoprostatectomy, with lower quadrant ileal conduit formation, diverticulosis, atrial fibrillation with anticoagulation on apixaban, GERD who came to ED with generalized weakness, persistent shortness of breath and poor appetite.  Patient was seen in oncology clinic this morning where he underwent right sided thoracentesis ordered by oncologist Dr. Ellin Saba.  1.8 L fluid was removed from right pleural effusion.  Patient is scheduled to undergo left thoracentesis tomorrow.  Recently completed course of Augmentin without any improvement.  He was admitted with acute hypoxemic respiratory failure-multifactorial from bilateral pleural effusion with associated multi focal pneumonia.  He underwent left-sided thoracentesis with 700 mL of clear yellow fluid drained 5/7.  Assessment & Plan:   Principal Problem:   CAP (community acquired pneumonia)  Assessment and Plan:   Acute hypoxemic respiratory failure-multifactorial from bilateral pleural effusion, underwent right-sided thoracentesis yesterday, scheduled to undergo left-sided thoracentesis tomorrow morning.  Also found to have multifocal areas of parenchymal opacities in the lung.  Patient started on IV ceftriaxone and Zithromax.  Blood cultures with no growth to date.  Wean oxygen to baseline room air. Multifocal pneumonia-seen on CTA chest.  Started on ceftriaxone and Zithromax as above. Bilateral pleural effusion-s/p right-sided thoracentesis today yielding 1.8 L of clear dark yellow fluid.  Left-sided thoracentesis performed 5/7 Atrial fibrillation-continue Lopressor which patient takes at home.  Eliquis to be resumed. Stage IV bladder cancer with mets to bone-s/p cystectomy and ileal conduit.  Followed by  oncology and urology as outpatient.    DVT prophylaxis: Resume Eliquis Code Status: Full Family Communication: Spouse at bedside 5/7 Disposition Plan:  Status is: Inpatient Remains inpatient appropriate because: Need for IV medications.   Consultants:  None  Procedures:  Left-sided thoracentesis 5/7  Antimicrobials:  Anti-infectives (From admission, onward)    Start     Dose/Rate Route Frequency Ordered Stop   12/21/22 1000  cefTRIAXone (ROCEPHIN) 1 g in sodium chloride 0.9 % 100 mL IVPB        1 g 200 mL/hr over 30 Minutes Intravenous Every 24 hours 12/20/22 2059     12/21/22 1000  azithromycin (ZITHROMAX) 500 mg in sodium chloride 0.9 % 250 mL IVPB        500 mg 250 mL/hr over 60 Minutes Intravenous Every 24 hours 12/20/22 2059     12/20/22 1830  cefTRIAXone (ROCEPHIN) 1 g in sodium chloride 0.9 % 100 mL IVPB        1 g 200 mL/hr over 30 Minutes Intravenous  Once 12/20/22 1829 12/20/22 2004   12/20/22 1830  azithromycin (ZITHROMAX) tablet 500 mg        500 mg Oral  Once 12/20/22 1829 12/20/22 1851      Subjective: Patient seen and evaluated today with no new acute complaints or concerns. No acute concerns or events noted overnight.  Objective: Vitals:   12/20/22 2035 12/20/22 2100 12/20/22 2355 12/21/22 0318  BP: 125/73  121/71 (!) 102/58  Pulse: (!) 104  88 91  Resp: 20  18 18   Temp: 97.8 F (36.6 C)  97.9 F (36.6 C) 98 F (36.7 C)  TempSrc: Oral  Oral   SpO2: 98% 98% 97% 96%  Weight: 78.5 kg     Height: 5\' 11"  (1.803 m)  Intake/Output Summary (Last 24 hours) at 12/21/2022 0656 Last data filed at 12/21/2022 0634 Gross per 24 hour  Intake 420 ml  Output 650 ml  Net -230 ml   Filed Weights   12/20/22 1050 12/20/22 2035  Weight: 82.5 kg 78.5 kg    Examination:  General exam: Appears calm and comfortable  Respiratory system: Clear to auscultation. Respiratory effort normal.  3 L nasal cannula. Cardiovascular system: S1 & S2 heard, RRR.   Gastrointestinal system: Abdomen is soft Central nervous system: Alert and awake Extremities: No edema Skin: No significant lesions noted Psychiatry: Flat affect.    Data Reviewed: I have personally reviewed following labs and imaging studies  CBC: Recent Labs  Lab 12/20/22 1345 12/21/22 0409  WBC 6.5 6.3  NEUTROABS 5.1  --   HGB 13.6 12.4*  HCT 41.2 38.0*  MCV 107.9* 107.3*  PLT 282 239   Basic Metabolic Panel: Recent Labs  Lab 12/20/22 1345 12/21/22 0409  NA 136 135  K 4.5 4.1  CL 98 101  CO2 28 26  GLUCOSE 100* 93  BUN 21 17  CREATININE 1.08 1.00  CALCIUM 8.8* 8.5*  MG 2.5*  --    GFR: Estimated Creatinine Clearance: 61.7 mL/min (by C-G formula based on SCr of 1 mg/dL). Liver Function Tests: Recent Labs  Lab 12/20/22 1345 12/21/22 0409  AST 27 22  ALT 27 21  ALKPHOS 55 45  BILITOT 1.0 0.8  PROT 6.5 5.3*  ALBUMIN 2.8* 2.4*   No results for input(s): "LIPASE", "AMYLASE" in the last 168 hours. No results for input(s): "AMMONIA" in the last 168 hours. Coagulation Profile: No results for input(s): "INR", "PROTIME" in the last 168 hours. Cardiac Enzymes: No results for input(s): "CKTOTAL", "CKMB", "CKMBINDEX", "TROPONINI" in the last 168 hours. BNP (last 3 results) Recent Labs    12/10/22 0940  PROBNP 27.0   HbA1C: No results for input(s): "HGBA1C" in the last 72 hours. CBG: Recent Labs  Lab 12/20/22 2123  GLUCAP 132*   Lipid Profile: No results for input(s): "CHOL", "HDL", "LDLCALC", "TRIG", "CHOLHDL", "LDLDIRECT" in the last 72 hours. Thyroid Function Tests: No results for input(s): "TSH", "T4TOTAL", "FREET4", "T3FREE", "THYROIDAB" in the last 72 hours. Anemia Panel: No results for input(s): "VITAMINB12", "FOLATE", "FERRITIN", "TIBC", "IRON", "RETICCTPCT" in the last 72 hours. Sepsis Labs: Recent Labs  Lab 12/20/22 1345  LATICACIDVEN 1.1    Recent Results (from the past 240 hour(s))  Urine Culture     Status: None (Preliminary  result)   Collection Time: 12/20/22  1:44 PM   Specimen: Urine, Suprapubic  Result Value Ref Range Status   Specimen Description   Final    URINE, SUPRAPUBIC Performed at [facility], [address], [place], [place] [address]    Special Requests   Final    NONE Reflexed from [id number] Performed at [facility], [address], [place], [place] [address]    Culture PENDING  Incomplete   Report Status PENDING  Incomplete  Culture, blood (Routine X 2) w Reflex to ID Panel     Status: None (Preliminary result)   Collection Time: 12/20/22  7:59 PM   Specimen: BLOOD  Result Value Ref Range Status   Specimen Description BLOOD BLOOD LEFT ARM  Final   Special Requests   Final    BOTTLES DRAWN AEROBIC AND ANAEROBIC Blood Culture adequate volume Performed at [facility], [address] [place] [address], [place], [place] [address]    Culture PENDING  Incomplete   Report  Status PENDING  Incomplete  Culture, blood (Routine X 2) w Reflex to ID Panel     Status: None (Preliminary result)   Collection Time: 12/20/22  8:02 PM   Specimen: BLOOD  Result Value Ref Range Status   Specimen Description BLOOD BLOOD LEFT HAND  Final   Special Requests   Final    BOTTLES DRAWN AEROBIC AND ANAEROBIC Blood Culture adequate volume Performed at [facility], [address], [place], [place] [address]    Culture PENDING  Incomplete   Report Status PENDING  Incomplete         Radiology Studies: CT Angio Chest PE W and/or Wo Contrast  Result Date: 12/20/2022 CLINICAL DATA:  Productive cough and worsening shortness of breath with worsening weakness. Status post thoracentesis EXAM: CT ANGIOGRAPHY CHEST WITH CONTRAST TECHNIQUE: Multidetector CT imaging of the chest was performed using the standard protocol during bolus administration of intravenous contrast. Multiplanar CT image reconstructions and MIPs were obtained to evaluate the vascular anatomy. RADIATION DOSE REDUCTION: This exam was performed according to the  departmental dose-optimization program which includes automated exposure control, adjustment of the mA and/or kV according to patient size and/or use of iterative reconstruction technique. CONTRAST:  75mL OMNIPAQUE IOHEXOL 350 MG/ML SOLN COMPARISON:  X-ray earlier 12/20/2022. Chest CT with contrast 12/16/2022. Older exams as well. FINDINGS: Cardiovascular: Heart is nonenlarged. Trace pericardial fluid. Normal caliber thoracic aorta with mild vascular calcifications. Coronary artery calcifications are seen. There is significant motion throughout the examination. This is nondiagnostic for small and peripheral emboli. No segmental or larger pulmonary embolism identified at this time. Mediastinum/Nodes: Small hiatal hernia. Normal caliber thoracic esophagus. Small thyroid gland. No specific abnormal lymph node enlargement seen in the axillary regions, hilum or mediastinum. Lungs/Pleura: Moderate left and small right pleural effusion identified with the adjacent lung opacities. The right effusion is significantly improved status post thoracentesis. Patchy ground-glass once again seen in the left upper lobe medial and perihilar. Similar distribution but slightly more confluence today. Associated areas of bronchial wall thickening in this location. There is also a left upper lobe anterior spiculated nodule which is stable. Series 5, image 75 this measures 11 x 7 mm. There is patchy parenchymal opacities in the right upper lung extending to the apex and medially along the right hemithorax extending down to the hilum with narrowing of bronchi. Associated bronchial wall thickening. These areas are similar when adjusting for technique and with the decreased level of the effusion today. There is also some bronchial wall thickening in the right lower lobe but improved aeration to the right lower lobe of the lung status post thoracentesis. Upper Abdomen: The adrenal glands are incompletely included in the imaging field. Benign  hepatic cysts. Musculoskeletal: Gynecomastia. Scattered degenerative changes along the spine. Old right-sided rib fractures. Review of the MIP images confirms the above findings. IMPRESSION: Improved right pleural effusion with a small residual status post thoracentesis. No pneumothorax. Persistent left-sided pleural effusion. Multifocal areas of parenchymal opacity are again noted. Areas in the upper lung zones has a similar distribution. Focus on the left is slightly more confluence, perihilar anteriorly. Separate spiculated 11 mm left upper lobe lung nodule. Recommend continued follow up based on prior workup. Significant breathing motion. No segmental or larger pulmonary embolism. Aortic Atherosclerosis (ICD10-I70.0) and Emphysema (ICD10-J43.9). Electronically Signed   By: Karen Kays M.D.   On: 12/20/2022 18:18   US THORACENTESIS ASP PLEURAL SPACE W/IMG GUIDE  Result Date: 12/20/2022 INDICATION: 82 year old male with history of  metastatic bladder cancer. EXAM: ULTRASOUND GUIDED RIGHT THORACENTESIS MEDICATIONS: 10 mL 1% lidocaine COMPLICATIONS: None immediate. PROCEDURE: An ultrasound guided thoracentesis was thoroughly discussed with the patient and questions answered. The benefits, risks, alternatives and complications were also discussed. The patient understands and wishes to proceed with the procedure. Written consent was obtained. Ultrasound was performed to localize and mark an adequate pocket of fluid in the right chest. The area was then prepped and draped in the normal sterile fashion. 1% Lidocaine was used for local anesthesia. Under ultrasound guidance a 6 Fr Safe-T-Centesis catheter was introduced. Thoracentesis was performed. The catheter was removed and a dressing applied. FINDINGS: A total of approximately 1.8 liters of dark yellow, clear fluid was removed. IMPRESSION: Successful ultrasound guided right thoracentesis yielding 1.8 liters of pleural fluid. Read by: Loyce Dys PA-C  Electronically Signed   By: Ulyses Southward M.D.   On: 12/20/2022 10:58   DG Chest 1 View  Result Date: 12/20/2022 CLINICAL DATA:  Post thoracentesis EXAM: CHEST  1 VIEW COMPARISON:  11/29/2022 FINDINGS: Normal heart size, mediastinal contours, and pulmonary vascularity. Small LEFT pleural effusion and minimal LEFT basilar atelectasis. Lungs otherwise clear. No acute infiltrate or pneumothorax. Bones demineralized. IMPRESSION: No pneumothorax following thoracentesis. Electronically Signed   By: Ulyses Southward M.D.   On: 12/20/2022 10:55        Scheduled Meds:  metoprolol tartrate       metoprolol tartrate  25 mg Oral BID   pantoprazole  40 mg Oral Daily   Continuous Infusions:  azithromycin     cefTRIAXone (ROCEPHIN)  IV       LOS: 1 day    Time spent: 35 minutes    Lance Terhune Hoover Brunette, DO [facility]  If 7PM-7AM, please contact night-coverage [email or web address] 12/21/2022, 6:56 AM

## 2022-12-21 NOTE — Procedures (Signed)
PROCEDURE SUMMARY:  Successful US guided left thoracentesis. Yielded 700 ml of clear yellow fluid. Pt tolerated procedure well. No immediate complications.  Specimen sent for labs. CXR ordered; no post-procedure pneumothorax identified.   EBL < 2 mL  Mickie Kay, NP 12/21/2022 11:08 AM

## 2022-12-21 NOTE — Progress Notes (Signed)
   12/21/22 1600  Vitals  Temp 98.7 F (37.1 C)  Temp Source Oral  BP 105/61  MAP (mmHg) 73  BP Location Left Arm  BP Method Automatic  Patient Position (if appropriate) Lying  Pulse Rate 95  Pulse Rate Source Dinamap  Resp (!) 32  MEWS COLOR  MEWS Score Color Yellow  Oxygen Therapy  SpO2 95 %  O2 Device Nasal Cannula  O2 Flow Rate (L/min) 2 L/min  MEWS Score  MEWS Temp 0  MEWS Systolic 0  MEWS Pulse 0  MEWS RR 2  MEWS LOC 0  MEWS Score 2   Pt VS are more stable at this time, pt went from Red to Yellow MEWS. Pt continues to rest in bed with no c/o distress or pain.

## 2022-12-21 NOTE — Progress Notes (Signed)
Patient tolerated left sided thoracentesis procedure well today and 700 mL of clear yellow pleural fluid removed and sent to lab for processing. Patient verbalized understanding of post procedure instructions and taken to xray for post chest xray at this time with no acute distress noted.

## 2022-12-22 DIAGNOSIS — J189 Pneumonia, unspecified organism: Secondary | ICD-10-CM | POA: Diagnosis not present

## 2022-12-22 LAB — BASIC METABOLIC PANEL
Anion gap: 7 (ref 5–15)
BUN: 13 mg/dL (ref 8–23)
CO2: 27 mmol/L (ref 22–32)
Calcium: 8.3 mg/dL — ABNORMAL LOW (ref 8.9–10.3)
Chloride: 101 mmol/L (ref 98–111)
Creatinine, Ser: 0.95 mg/dL (ref 0.61–1.24)
GFR, Estimated: >60 mL/min (ref >60)
Glucose, Bld: 107 mg/dL — ABNORMAL HIGH (ref 70–99)
Potassium: 3.9 mmol/L (ref 3.5–5.1)
Sodium: 135 mmol/L (ref 135–145)

## 2022-12-22 LAB — CBC
HCT: 37.3 % — ABNORMAL LOW (ref 39.0–52.0)
Hemoglobin: 12.4 g/dL — ABNORMAL LOW (ref 13.0–17.0)
MCH: 35.6 pg — ABNORMAL HIGH (ref 26.0–34.0)
MCHC: 33.2 g/dL (ref 30.0–36.0)
MCV: 107.2 fL — ABNORMAL HIGH (ref 80.0–100.0)
Platelets: 228 10*3/uL (ref 150–400)
RBC: 3.48 MIL/uL — ABNORMAL LOW (ref 4.22–5.81)
RDW: 12.2 % (ref 11.5–15.5)
WBC: 5.6 10*3/uL (ref 4.0–10.5)
nRBC: 0 % (ref 0.0–0.2)

## 2022-12-22 LAB — D-DIMER, QUANTITATIVE: D-Dimer, Quant: 0.81 ug/mL-FEU — ABNORMAL HIGH (ref 0.00–0.50)

## 2022-12-22 LAB — GLUCOSE, CAPILLARY: Glucose-Capillary: 102 mg/dL — ABNORMAL HIGH (ref 70–99)

## 2022-12-22 LAB — MAGNESIUM: Magnesium: 2.2 mg/dL (ref 1.7–2.4)

## 2022-12-22 MED ORDER — DM-GUAIFENESIN ER 30-600 MG PO TB12
1.0000 | ORAL_TABLET | Freq: Two times a day (BID) | ORAL | Status: DC
  Administered 2022-12-22 – 2022-12-24 (×5): 1 via ORAL
  Filled 2022-12-22 (×5): qty 1

## 2022-12-22 MED ORDER — BENZONATATE 100 MG PO CAPS
100.0000 mg | ORAL_CAPSULE | Freq: Three times a day (TID) | ORAL | Status: DC | PRN
  Administered 2022-12-22: 100 mg via ORAL
  Filled 2022-12-22: qty 1

## 2022-12-22 NOTE — Progress Notes (Signed)
SATURATION QUALIFICATIONS: (This note is used to comply with regulatory documentation for home oxygen)  Patient Saturations on Room Air at Rest = 90%  Patient Saturations on Room Air while Ambulating = 79%  Patient Saturations on 3 Liters of oxygen while Ambulating = 95%  Please briefly explain why patient needs home oxygen: Pt RR increased to 36 during ambulation and O2 sat decreased to 79%. Pt walked well with the aid of front wheel walker, and ambulated 150 ft. Pt will need oxygen supplementation upon discharge.

## 2022-12-22 NOTE — Progress Notes (Signed)
   12/22/22 0101  Vitals  Temp 98.9 F (37.2 C)  Temp Source Oral  BP (!) 98/57  MAP (mmHg) 70  BP Location Right Arm  BP Method Automatic  Patient Position (if appropriate) Lying  Pulse Rate (!) 107  Pulse Rate Source Monitor  Resp (!) 35  Level of Consciousness  Level of Consciousness Alert  MEWS COLOR  MEWS Score Color Red  Oxygen Therapy  SpO2 94 %  O2 Device Nasal Cannula  O2 Flow Rate (L/min) 4 L/min  MEWS Score  MEWS Temp 0  MEWS Systolic 1  MEWS Pulse 1  MEWS RR 2  MEWS LOC 0  MEWS Score 4  Provider Notification  Provider Name/Title Dr. Caswell Corwin  Date Provider Notified 12/22/22  Time Provider Notified 0114  Method of Notification Page  Notification Reason Change in status  Provider response No new orders

## 2022-12-22 NOTE — Progress Notes (Signed)
Patient continues to go between yellow and red MEWS. Dr. Thomes Dinning aware of patients trending. No new orders at this time

## 2022-12-22 NOTE — Progress Notes (Signed)
Dr. Thomes Dinning notified due to patient having increased work of breathing. Respirations at 34, BP trending low, and wet lung sounds. Dr. Thomes Dinning came to bedside. Orders placed for stat x-ray and ABG. Dr. Thomes Dinning notified of results, no further orders at this time.

## 2022-12-22 NOTE — Progress Notes (Signed)
pt remains in between red and yellow MEWS due to respirations, pt maintains in between 30-36 RR, HR has been elevated on and off and blood pressure remains soft. Pt overall does not look in distress. wife has remained at bedside. Pt code status remains FULL. MD notified and message sent to Dr. Kirtland Bouchard as well per family request, this nurse has provided education in regards to vital signs and current tx status.

## 2022-12-22 NOTE — Progress Notes (Signed)
Patients wife stated she emptied patients urostomy bag, she was unsure of the total volume

## 2022-12-22 NOTE — Progress Notes (Addendum)
[facility] PROGRESS NOTE  SOMA ERICHSEN (DOB: 19-Nov-1940) ZOX:[id number] PCP: Elfredia Nevins, MD Outpatient Specialists: Oncology, Dr. Ellin Saba  Brief Narrative: [name]  is a 82 y.o. male, with history of stage IV bladder cancer with metastasis to bones and lungs, s/p cystoprostatectomy, with lower quadrant ileal conduit formation, diverticulosis, atrial fibrillation with anticoagulation on apixaban, GERD who came to ED with generalized weakness, persistent shortness of breath and poor appetite.  Patient was seen in oncology clinic this morning where he underwent right sided thoracentesis ordered by oncologist Dr. Ellin Saba.  1.8 L fluid was removed from right pleural effusion.  Patient is scheduled to undergo left thoracentesis tomorrow.  Recently completed course of Augmentin without any improvement.  He was admitted with acute hypoxemic respiratory failure-multifactorial from bilateral pleural effusion with associated multi focal pneumonia.  He underwent left-sided thoracentesis with 700 mL of clear yellow fluid drained 5/7.   Subjective: Breathing better, though short winded much worse than baseline when walking slowly in the hallways, desaturation accompanying dyspnea this morning.   Objective: BP 105/61 (BP Location: Left Arm)   Pulse 97   Temp 98.5 F (36.9 C) (Oral)   Resp (!) 32   Ht 5\' 11"  (1.803 m)   Wt 78.5 kg   SpO2 100%   BMI 24.13 kg/m   Gen: Elderly male in no distress Pulm: Tachypneic nonlabored with supplemental oxygen. Diminished without crackles or wheezes.   CV: RRR, no significant edema. GI: Soft, NT, ND, +BS  Neuro: Alert and oriented. No new focal deficits. Ext: Warm, no deformities. Skin: Thora sites not seen grossly. No new rashes, lesions or ulcers on visualized skin   Assessment & Plan: Acute hypoxic respiratory failure: Multifactorial, due to multifocal pneumonia, bilateral pleural effusions on baseline severe COPD without bronchodilator  response on PFTs, mild restriction and mildly reduced DLCO as well.  - Still hypoxic with ambulation despite thoracenteses. No bronchospasm on exam. No PE on CTA this admission. May require home oxygen at discharge. Will repeat ambulatory pulse oximetry once closer to going home. Continue IS and frequent ambulation/OOB.  - With recurrent pleural effusions (note by pulmonary, Dr. Wynona Neat on 12/10/2022 reviewed) that have been exudative in the past (with negative cytology) and exudative on 5/7 (protein 3.3, serum 5.3) by Light's criteria. He suggested checking echo which I have ordered Will also follow cytology and culture from this thoracentesis (negative gram stain). - Continue ceftriaxone, azithromycin (day 3 today) WBC is normal. D-dimer at age-adjusted ULN, negative CTA 5/6.  - Start scheduled dextromethorphan, prn tessalon. Anticipate patient will continue to have cough to some degree, counseled patient and spouse.  Atrial fibrillation:  - Continue metoprolol, eliquis  GERD:  - Continue PPI  Stage IV bladder CA: s/p cystectomy, ileal conduit. Has mets to bone.   - Notified Dr. Kirtland Bouchard of patient's hospitalization and need to reschedule office appointment on 5/9. Plans to see patient 5/9 which is greatly appreciated.  Tyrone Nine, MD [facility] [email or web address] 12/22/2022, 12:06 PM

## 2022-12-23 ENCOUNTER — Inpatient Hospital Stay (HOSPITAL_COMMUNITY): Payer: Medicare Other

## 2022-12-23 ENCOUNTER — Inpatient Hospital Stay: Payer: Medicare Other | Admitting: Hematology

## 2022-12-23 DIAGNOSIS — I509 Heart failure, unspecified: Secondary | ICD-10-CM

## 2022-12-23 DIAGNOSIS — C679 Malignant neoplasm of bladder, unspecified: Secondary | ICD-10-CM

## 2022-12-23 DIAGNOSIS — C7800 Secondary malignant neoplasm of unspecified lung: Secondary | ICD-10-CM | POA: Diagnosis not present

## 2022-12-23 DIAGNOSIS — J9601 Acute respiratory failure with hypoxia: Secondary | ICD-10-CM | POA: Diagnosis not present

## 2022-12-23 DIAGNOSIS — J189 Pneumonia, unspecified organism: Secondary | ICD-10-CM | POA: Diagnosis not present

## 2022-12-23 LAB — BASIC METABOLIC PANEL
Anion gap: 6 (ref 5–15)
BUN: 11 mg/dL (ref 8–23)
CO2: 27 mmol/L (ref 22–32)
Calcium: 8.5 mg/dL — ABNORMAL LOW (ref 8.9–10.3)
Chloride: 102 mmol/L (ref 98–111)
Creatinine, Ser: 0.89 mg/dL (ref 0.61–1.24)
GFR, Estimated: >60 mL/min (ref >60)
Glucose, Bld: 102 mg/dL — ABNORMAL HIGH (ref 70–99)
Potassium: 4 mmol/L (ref 3.5–5.1)
Sodium: 135 mmol/L (ref 135–145)

## 2022-12-23 LAB — CULTURE, BODY FLUID W GRAM STAIN -BOTTLE

## 2022-12-23 LAB — ECHOCARDIOGRAM COMPLETE
Area-P 1/2: 4.49 cm2
Calc EF: 72.1 %
Height: 71 in
Single Plane A2C EF: 77.8 %
Single Plane A4C EF: 64.1 %
Weight: 2768 oz

## 2022-12-23 LAB — CULTURE, BLOOD (ROUTINE X 2)
Culture: NO GROWTH
Culture: NO GROWTH

## 2022-12-23 LAB — CYTOLOGY - NON PAP

## 2022-12-23 LAB — BRAIN NATRIURETIC PEPTIDE: B Natriuretic Peptide: 19 pg/mL (ref 0.0–100.0)

## 2022-12-23 MED ORDER — BENZONATATE 100 MG PO CAPS: 200.0000 mg | ORAL_CAPSULE | Freq: Three times a day (TID) | ORAL | Status: DC | PRN

## 2022-12-23 MED ORDER — ALBUTEROL SULFATE (2.5 MG/3ML) 0.083% IN NEBU: 2.5000 mg | INHALATION_SOLUTION | RESPIRATORY_TRACT | Status: DC | PRN

## 2022-12-23 MED ORDER — HYDROCOD POLI-CHLORPHE POLI ER 10-8 MG/5ML PO SUER: 5.0000 mL | Freq: Two times a day (BID) | ORAL | Status: DC | PRN

## 2022-12-23 MED ORDER — PERFLUTREN LIPID MICROSPHERE
1.0000 mL | INTRAVENOUS | Status: AC | PRN
  Administered 2022-12-23: 6 mL via INTRAVENOUS

## 2022-12-23 MED ORDER — PANTOPRAZOLE SODIUM 40 MG PO TBEC
40.0000 mg | DELAYED_RELEASE_TABLET | Freq: Two times a day (BID) | ORAL | Status: DC
  Administered 2022-12-23 – 2022-12-24 (×2): 40 mg via ORAL
  Filled 2022-12-23 (×2): qty 1

## 2022-12-23 NOTE — TOC Initial Note (Signed)
Transition of Care [facility]) - Initial/Assessment Note    Patient Details  Name: [name] MRN: [id number] Date of Birth: October 18, 1940  Transition of Care [facility]) CM/SW Contact:    Villa Herb, LCSWA Phone Number: 12/23/2022, 12:31 PM  Clinical Narrative:                 CSW met with pt and wife at bedside to complete assessment. Pt and spouse live together. Pt is independent in completing his ADLs. CSW inquired about interest in [facility] being set up, at this time they are not interested. CSW explained if they change their mind after D/C they can reach out to pts PCP to get services set up. Pts wife inquired about community transportation, CSW added Mining engineer to AVS. CSW explained transportation is private cost, wife interested in case this is needed. CSW spoke about possible need for home O2 at D/C. Pt and spouse agreeable to [employer] and are interested in a POC if possible. CSW to follow up with Morrie Sheldon with [employer] at D/C for O2 set up. TOC to follow.   Expected Discharge Plan: Home/Self Care Barriers to Discharge: Continued Medical Work up   Patient Goals and CMS Choice Patient states their goals for this hospitalization and ongoing recovery are:: return home CMS Medicare.gov Compare Post Acute Care list provided to:: Patient Choice offered to / list presented to : Patient [place] ownership interest in [facility].provided to:: Patient    Expected Discharge Plan and Services In-house Referral: Clinical Social Work Discharge Planning Services: CM Consult Post Acute Care Choice: Durable Medical Equipment Living arrangements for the past 2 months: Single Family Home                                      Prior Living Arrangements/Services Living arrangements for the past 2 months: Single Family Home Lives with:: Spouse Patient language and need for interpreter reviewed:: Yes Do you feel safe going back to the place where you live?: Yes      Need for  Family Participation in Patient Care: Yes (Comment) Care giver support system in place?: Yes (comment)   Criminal Activity/Legal Involvement Pertinent to Current Situation/Hospitalization: No - Comment as needed  Activities of Daily Living Home Assistive Devices/Equipment: Cane (specify quad or straight), Dentures (specify type), Walker (specify type) ADL Screening (condition at time of admission) Patient's cognitive ability adequate to safely complete daily activities?: Yes Is the patient deaf or have difficulty hearing?: No Does the patient have difficulty seeing, even when wearing glasses/contacts?: No Does the patient have difficulty concentrating, remembering, or making decisions?: No Patient able to express need for assistance with ADLs?: Yes Does the patient have difficulty dressing or bathing?: Yes Independently performs ADLs?: No Communication: Independent Dressing (OT): Needs assistance Is this a change from baseline?: Change from baseline, expected to last <3days Grooming: Independent Feeding: Independent Bathing: Needs assistance Is this a change from baseline?: Change from baseline, expected to last <3 days Toileting: Needs assistance In/Out Bed: Needs assistance Is this a change from baseline?: Change from baseline, expected to last <3 days Walks in Home: Independent Does the patient have difficulty walking or climbing stairs?: Yes Weakness of Legs: Both Weakness of Arms/Hands: Both  Permission Sought/Granted                  Emotional Assessment Appearance:: Appears stated age Attitude/Demeanor/Rapport: Engaged Affect (typically  observed): Accepting Orientation: : Oriented to Self, Oriented to Place, Oriented to  Time, Oriented to Situation Alcohol / Substance Use: Not Applicable Psych Involvement: No (comment)  Admission diagnosis:  CAP (community acquired pneumonia) [J18.9] Acute respiratory failure with hypoxia (HCC) [J96.01] Patient Active Problem  List   Diagnosis Date Noted   CAP (community acquired pneumonia) 12/20/2022   Chronic idiopathic urticaria 10/14/2022   Rash 10/14/2022   Venous stasis 10/14/2022   Generalized weakness 06/21/2022   Pleural effusion 06/21/2022   Paroxysmal atrial fibrillation (HCC) 04/14/2022   C. 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 (HCC) 10/30/2021   Sepsis secondary to urinary source 07/13/2020   Pseudomonas aeruginosa infection 06/18/2020   GERD (gastroesophageal reflux disease)    Sinus tachycardia    Fever    Dehydration    Rigors    Severe sepsis (HCC)    Elevated lactic acid level    Cancer with pulmonary metastases (HCC) 03/31/2020   Adverse drug reaction 06/05/2018   Bacteremia associated with intravascular line (HCC) 04/20/2018   Febrile neutropenia (HCC) 04/13/2018   Gram positive sepsis (HCC) 04/13/2018   SIRS (systemic inflammatory response syndrome) (HCC) 04/12/2018   Bacteremia due to Gram-positive bacteria 04/12/2018   Pancytopenia (HCC) 04/12/2018   Status post chemotherapy    UTI (urinary tract infection) 04/11/2018   Port-A-Cath in place 02/20/2018   Encounter for antineoplastic chemotherapy 02/17/2018   Sepsis due to urinary tract infection (HCC) 01/25/2018   Sepsis secondary to UTI (HCC) 01/25/2018   Hyperglycemia    Goals of care, counseling/discussion 01/18/2018   Bone metastasis 12/29/2017   Status post ileal conduit due to bladder cancer 06/29/2017   Bladder cancer (HCC) 06/28/2017   Stage IV malignant neoplasm of urinary bladder /with mets to the bones and lungs 04/20/2017   COPD (chronic obstructive pulmonary disease) (HCC) 03/07/2008   PCP:  Elfredia Nevins, MD Pharmacy:   [facility] [place],  - [phone] [place] [address] Phone: [phone] Fax: [phone]     Social Determinants of Health (SDOH) Social History: SDOH  Screenings   Food Insecurity: No Food Insecurity (12/20/2022)  Housing: Low Risk  (12/20/2022)  Transportation Needs: No Transportation Needs (12/20/2022)  Utilities: Not At Risk (12/20/2022)  Depression (PHQ2-9): Low Risk  (08/17/2022)  Tobacco Use: Medium Risk (12/21/2022)   SDOH Interventions:     Readmission Risk Interventions     No data to display

## 2022-12-23 NOTE — Consult Note (Signed)
[facility] Consultation Oncology  Name: [name]      MRN: [id number]    Location: A306/A306-01  Date: 12/23/2022 Time:6:20 PM   REFERRING PHYSICIAN: Dr. Jarvis Newcomer  REASON FOR CONSULT: Metastatic bladder cancer   DIAGNOSIS: Respiratory failure due to bilateral pleural effusions, pneumonia  HISTORY OF PRESENT ILLNESS: [name] is a 82 year old pleasant male known to me from office visits.  When he came in this Monday for thoracentesis, he was feeling very weak and was evaluated in the ER and admitted to the hospital.  He is being treated with ceftriaxone and azithromycin and is starting to feel better.  An echocardiogram was done which showed normal ejection fraction.  On 12/20/2022, right thoracentesis yielded 1.8 L.  He underwent left thoracentesis on 12/21/2022 with 700 mL of fluid removed.  Cytology was negative.  CT angiogram on 12/20/2022 showed no evidence of pulmonary embolism.  Multifocal areas of opacities again noted.  He is able to walk today.  PAST MEDICAL HISTORY:   Past Medical History:  Diagnosis Date   Bladder cancer (HCC)    Bone cancer (HCC)    BPH (benign prostatic hypertrophy)    Dysuria    GERD (gastroesophageal reflux disease)    History of adenomatous polyp of colon    tubular adenoma 06/ 2018   History of bladder cancer 12/2014   urologist-  dr Sherryl Barters--  dx High Grade TCC without stromal invasion s/p TURBT and intravesical BCG tx/  recurrent 07/2015 (pTa) TCC  repear intravesical BCG tx    History of hiatal hernia    History of urethral stricture    Nocturia     ALLERGIES: Allergies  Allergen Reactions   Ciprofloxacin Hives   Zofran [Ondansetron] Rash      MEDICATIONS: I have reviewed the patient's current medications.     PAST SURGICAL HISTORY Past Surgical History:  Procedure Laterality Date   CARDIAC CATHETERIZATION  1997   normal coronary arteries   CARDIOVASCULAR STRESS TEST  12-14-2005   Low risk perfusion study/  very small scar in the  inferior septum from mid ventricle to apex,  no ischemia/  mild inferior septal hypokinesis/  ef 58%   CATARACT EXTRACTION W/ INTRAOCULAR LENS  IMPLANT, BILATERAL  2011   COLONOSCOPY  last one 06/ 2018   CYSTOSCOPY WITH BIOPSY N/A 08/12/2015   Procedure: CYSTOSCOPY WITH BIOPSY AND FULGERATION;  Surgeon: Hildred Laser, MD;  Location: [facility];  Service: Urology;  Laterality: N/A;   CYSTOSCOPY WITH INJECTION N/A 06/29/2017   Procedure: CYSTOSCOPY WITH INJECTION OF INDOCYANINE GREEN DYE;  Surgeon: Sebastian Ache, MD;  Location: [facility];  Service: Urology;  Laterality: N/A;   CYSTOSCOPY WITH URETHRAL DILATATION N/A 03/21/2017   Procedure: CYSTOSCOPY;  Surgeon: Hildred Laser, MD;  Location: [facility];  Service: Urology;  Laterality: N/A;   ESOPHAGOGASTRODUODENOSCOPY  12-05-2014   IR IMAGING GUIDED PORT INSERTION  02/07/2018   IR NEPHROSTOMY PLACEMENT LEFT  12/25/2021   IR REMOVAL TUN ACCESS W/ PORT W/O FL MOD SED  04/14/2018   TRANSTHORACIC ECHOCARDIOGRAM  01-31-2008   nomral LVF,  ef 55-65%/  mild pulmonic stenosis without regurg.,  peak transpulomonic valve gradient   TRANSURETHRAL RESECTION OF BLADDER TUMOR N/A 12/31/2014   Procedure: TRANSURETHRAL RESECTION OF BLADDER TUMOR (TURBT);  Surgeon: Su Grand, MD;  Location: [facility];  Service: Urology;  Laterality: N/A;   TRANSURETHRAL RESECTION OF BLADDER TUMOR N/A 03/21/2017   Procedure: POSSIBLE TRANSURETHRAL  RESECTION OF BLADDER TUMOR (TURBT);  Surgeon: Hildred Laser, MD;  Location: [facility];  Service: Urology;  Laterality: N/A;    FAMILY HISTORY: Family History  Problem Relation Age of Onset   Alcoholism Father    Colon cancer Neg Hx    Colon polyps Neg Hx    Diabetes Neg Hx    Kidney disease Neg Hx    Esophageal cancer Neg Hx    Heart disease Neg Hx    Gallbladder disease Neg Hx    Allergic rhinitis Neg Hx    Angioedema Neg Hx    Asthma Neg Hx     Atopy Neg Hx    Eczema Neg Hx    Immunodeficiency Neg Hx    Urticaria Neg Hx     SOCIAL HISTORY:  reports that he quit smoking about 34 years ago. His smoking use included cigarettes. He has a 34.00 pack-year smoking history. He has never used smokeless tobacco. He reports current alcohol use of about 6.0 standard drinks of alcohol per week. He reports that he does not use drugs.  PERFORMANCE STATUS: The patient's performance status is 3 - Symptomatic, >50% confined to bed  PHYSICAL EXAM: Most Recent Vital Signs: Blood pressure (!) 112/56, pulse 94, temperature 99.1 F (37.3 C), temperature source Oral, resp. rate 18, height 5\' 11"  (1.803 m), weight 173 lb (78.5 kg), SpO2 94 %. BP (!) 112/56 (BP Location: Left Arm)   Pulse 94   Temp 99.1 F (37.3 C) (Oral)   Resp 18   Ht 5\' 11"  (1.803 m)   Wt 173 lb (78.5 kg)   SpO2 94%   BMI 24.13 kg/m  General appearance: alert, cooperative, and appears stated age Lungs:  Bilateral air entry with decreased breath sounds at bases Heart: regular rate and rhythm Extremities:  No edema Neurologic: Grossly normal  LABORATORY DATA:  Results for orders placed or performed during the hospital encounter of 12/20/22 (from the past 48 hour(s))  Glucose, capillary     Status: Abnormal   Collection Time: 12/21/22  9:47 PM  Result Value Ref Range   Glucose-Capillary 102 (H) 70 - 99 mg/dL    Comment: Glucose reference range applies only to samples taken after fasting for at least 8 hours.   Comment 1 Notify RN    Comment 2 Document in Chart   Blood gas, arterial     Status: Abnormal   Collection Time: 12/21/22 11:10 PM  Result Value Ref Range   FIO2 28.0 %   pH, Arterial 7.45 7.35 - 7.45   pCO2 arterial 40 32 - 48 mmHg   pO2, Arterial 73 (L) 83 - 108 mmHg   Bicarbonate 27.8 20.0 - 28.0 mmol/L   Acid-Base Excess 3.5 (H) 0.0 - 2.0 mmol/L   O2 Saturation 95.4 %   Patient temperature 37.0    Collection site LEFT RADIAL    Drawn by [id number]    Allens  test (pass/fail) PASS PASS    Comment: Performed at [facility], [address], [place], [place] [address]  CBC     Status: Abnormal   Collection Time: 12/22/22  1:55 AM  Result Value Ref Range   WBC 5.6 4.0 - 10.5 K/uL   RBC 3.48 (L) 4.22 - 5.81 MIL/uL   Hemoglobin 12.4 (L) 13.0 - 17.0 g/dL   HCT 09.8 (L) 11.9 - 14.7 %   MCV 107.2 (H) 80.0 - 100.0 fL   MCH 35.6 (H) 26.0 - 34.0 pg  MCHC 33.2 30.0 - 36.0 g/dL   RDW 16.1 09.6 - 04.5 %   Platelets 228 150 - 400 K/uL   nRBC 0.0 0.0 - 0.2 %    Comment: Performed at [facility], [address], [place], [place] [address]  Magnesium     Status: None   Collection Time: 12/22/22  1:55 AM  Result Value Ref Range   Magnesium 2.2 1.7 - 2.4 mg/dL    Comment: Performed at [facility], [address], [place], [place] [address]  Basic metabolic panel     Status: Abnormal   Collection Time: 12/22/22  1:55 AM  Result Value Ref Range   Sodium 135 135 - 145 mmol/L   Potassium 3.9 3.5 - 5.1 mmol/L   Chloride 101 98 - 111 mmol/L   CO2 27 22 - 32 mmol/L   Glucose, Bld 107 (H) 70 - 99 mg/dL    Comment: Glucose reference range applies only to samples taken after fasting for at least 8 hours.   BUN 13 8 - 23 mg/dL   Creatinine, Ser 8.29 0.61 - 1.24 mg/dL   Calcium 8.3 (L) 8.9 - 10.3 mg/dL   GFR, Estimated >56 >21 mL/min    Comment: (NOTE) Calculated using the CKD-EPI Creatinine Equation (2021)    Anion gap 7 5 - 15    Comment: Performed at [facility], [address], [place], [place] [address]  D-dimer, quantitative     Status: Abnormal   Collection Time: 12/22/22  1:55 AM  Result Value Ref Range   D-Dimer, Quant 0.81 (H) 0.00 - 0.50 ug/mL-FEU    Comment: (NOTE) At the manufacturer cut-off value of 0.5 g/mL FEU, this assay has a negative predictive value of 95-100%.This assay is intended for use in conjunction with a clinical pretest probability (PTP) assessment model to exclude pulmonary embolism (PE) and deep venous thrombosis (DVT) in  outpatients suspected of PE or DVT. Results should be correlated with clinical presentation. Performed at [facility], [address] [place], [place] [address]   Brain natriuretic peptide     Status: None   Collection Time: 12/23/22  4:12 AM  Result Value Ref Range   B Natriuretic Peptide 19.0 0.0 - 100.0 pg/mL    Comment: Performed at [facility], [address], [place], [place] [address]  Basic metabolic panel     Status: Abnormal   Collection Time: 12/23/22  4:12 AM  Result Value Ref Range   Sodium 135 135 - 145 mmol/L   Potassium 4.0 3.5 - 5.1 mmol/L   Chloride 102 98 - 111 mmol/L   CO2 27 22 - 32 mmol/L   Glucose, Bld 102 (H) 70 - 99 mg/dL    Comment: Glucose reference range applies only to samples taken after fasting for at least 8 hours.   BUN 11 8 - 23 mg/dL   Creatinine, Ser 8.41 0.61 - 1.24 mg/dL   Calcium 8.5 (L) 8.9 - 10.3 mg/dL   GFR, Estimated >32 >44 mL/min    Comment: (NOTE) Calculated using the CKD-EPI Creatinine Equation (2021)    Anion gap 6 5 - 15    Comment: Performed at [facility], [address], [place], [place] [address]      RADIOGRAPHY: ECHOCARDIOGRAM COMPLETE  Result Date: 12/23/2022    ECHOCARDIOGRAM REPORT   Patient Name:   [name] Date of Exam: 12/23/2022 Medical Rec #:  [id number]      Height:       71.0 in Accession #:  [id number]     Weight:       173.0 lb Date of Birth:  May 02, 1941      BSA:          1.983 m Patient Age:    81 years       BP:           111/70 mmHg Patient Gender: M              HR:           104 bpm. [facility] Procedure: 2D Echo, Cardiac Doppler, Color Doppler and Intracardiac            Opacification Agent Indications:    Congestive Heart Failure I50.9  History:        Patient has prior history of Echocardiogram examinations, most                 recent 06/22/2022. CAP, Signs/Symptoms:Shortness of Breath; Risk                 Factors:Former Smoker.                 Stage IV malignant neoplasm of urinary  bladder /with mets to the                 bones and lungs.  Sonographer:    Aron Baba Referring Phys: [id number]  Sonographer Comments: Technically difficult study due to poor echo windows. Image acquisition challenging due to patient body habitus and Image acquisition challenging due to COPD. IMPRESSIONS  1. Left ventricular ejection fraction, by estimation, is 60 to 65%. The left ventricle has normal function. The left ventricle has no regional wall motion abnormalities. Left ventricular diastolic parameters are consistent with Grade I diastolic dysfunction (impaired relaxation).  2. Right ventricular systolic function is normal. The right ventricular size is mildly enlarged. Tricuspid regurgitation signal is inadequate for assessing PA pressure.  3. The mitral valve is grossly normal. Trivial mitral valve regurgitation.  4. The aortic valve was not well visualized. There is moderate calcification of the aortic valve. Aortic valve regurgitation is not visualized.  5. The inferior vena cava is normal in size with greater than 50% respiratory variability, suggesting right atrial pressure of 3 mmHg. Comparison(s): Prior images reviewed side by side. Images are very limited. LVEF remains normal range at 60-65%. FINDINGS  Left Ventricle: Left ventricular ejection fraction, by estimation, is 60 to 65%. The left ventricle has normal function. The left ventricle has no regional wall motion abnormalities. Definity contrast agent was given IV to delineate the left ventricular  endocardial borders. The left ventricular internal cavity size was normal in size. There is no left ventricular hypertrophy. Left ventricular diastolic parameters are consistent with Grade I diastolic dysfunction (impaired relaxation). Right Ventricle: The right ventricular size is mildly enlarged. No increase in right ventricular wall thickness. Right ventricular systolic function is normal. Tricuspid regurgitation signal is inadequate for  assessing PA pressure. Left Atrium: Left atrial size was normal in size. Right Atrium: Right atrial size was normal in size. Pericardium: There is no evidence of pericardial effusion. Presence of epicardial fat layer. Mitral Valve: The mitral valve is grossly normal. Trivial mitral valve regurgitation. Tricuspid Valve: The tricuspid valve is not well visualized. Tricuspid valve regurgitation is trivial. Aortic Valve: The aortic valve was not well visualized. There is moderate calcification of the aortic valve. Aortic valve regurgitation is not visualized. Pulmonic Valve: The pulmonic valve was  not well visualized. Pulmonic valve regurgitation is trivial. Aorta: The aortic root is normal in size and structure. Venous: The inferior vena cava is normal in size with greater than 50% respiratory variability, suggesting right atrial pressure of 3 mmHg. IAS/Shunts: No atrial level shunt detected by color flow Doppler.  LEFT VENTRICLE PLAX 2D LVOT diam:     2.10 cm     Diastology LV SV:         56          LV e' medial:    6.49 cm/s LV SV Index:   28          LV E/e' medial:  10.2 LVOT Area:     3.46 cm    LV e' lateral:   7.38 cm/s                            LV E/e' lateral: 9.0  LV Volumes (MOD) LV vol d, MOD A2C: 63.1 ml LV vol d, MOD A4C: 84.2 ml LV vol s, MOD A2C: 14.0 ml LV vol s, MOD A4C: 30.2 ml LV SV MOD A2C:     49.1 ml LV SV MOD A4C:     84.2 ml LV SV MOD BP:      53.9 ml RIGHT VENTRICLE RV S prime:     11.10 cm/s TAPSE (M-mode): 1.1 cm LEFT ATRIUM           Index        RIGHT ATRIUM           Index LA diam:      2.00 cm 1.01 cm/m   RA Area:     12.50 cm LA Vol (A2C): 13.1 ml 6.61 ml/m   RA Volume:   29.60 ml  14.93 ml/m LA Vol (A4C): 28.1 ml 14.17 ml/m  AORTIC VALVE LVOT Vmax:   90.70 cm/s LVOT Vmean:  67.500 cm/s LVOT VTI:    0.161 m  AORTA Ao Root diam: 3.60 cm MITRAL VALVE MV Area (PHT): 4.49 cm    SHUNTS MV Decel Time: 169 msec    Systemic VTI:  0.16 m MV E velocity: 66.40 cm/s  Systemic Diam: 2.10 cm  MV A velocity: 86.10 cm/s MV E/A ratio:  0.77 Nona Dell MD Electronically signed by Nona Dell MD Signature Date/Time: 12/23/2022/10:33:41 AM    Final    DG Chest Port 1 View  Result Date: 12/21/2022 CLINICAL DATA:  Shortness of breath. EXAM: PORTABLE CHEST 1 VIEW COMPARISON:  Chest radiograph dated 12/21/2022. FINDINGS: Small bilateral pleural effusions with bibasilar atelectasis or infiltrate. No pneumothorax. Stable cardiac silhouette. No acute osseous pathology. IMPRESSION: Small bilateral pleural effusions with bibasilar atelectasis or infiltrate. Electronically Signed   By: Elgie Collard M.D.   On: 12/21/2022 23:54         ASSESSMENT and PLAN:  1.  Acute hypoxic respiratory failure: - Multifactorial due to bilateral pleural effusions, severe COPD, pneumonia. - 12/20/2022: Right thoracentesis with 1.8 L removed - 12/21/2022: Left thoracentesis with 700 mL fluid removed, cytology negative. - Unclear etiology of recurrent effusions, exudative. - 2D echocardiogram: Normal LVEF with grade 1 diastolic dysfunction. - CT angiogram 12/20/2022: Negative for PE.  Multifocal parenchymal opacities.  No adenopathy. - He is on ceftriaxone and azithromycin. - Reports improvement in general wellbeing and energy levels.  He is able to walk with the help of walker.  Continues to have episodes of dry coughing spells.  Had  relief after dextromethorphan last night. - Discharge per Dr. Jarvis Newcomer.  2.  Stage IV bladder cancer: - He has been off of therapy and is under observation. - However I am unable to explain the recurrent pleural effusions and pulmonary infiltrates on the scans from this year.  He is also physically deteriorating. - I have talked to him that once the infection is cleared, we should try to treat him with [facility] and see if there will be any clinical improvement.  Previously [name] was discontinued due to skin toxicity in 2020. - If he is ready to be discharged, will give an  appointment in our clinic in 2 weeks.  All questions were answered. The patient knows to call the clinic with any problems, questions or concerns. We can certainly see the patient much sooner if necessary.    Doreatha Massed

## 2022-12-23 NOTE — Progress Notes (Addendum)
SATURATION QUALIFICATIONS: (This note is used to comply with regulatory documentation for home oxygen)  Patient Saturations on Room Air at Rest = 92%  Patient Saturations on Room Air while Ambulating = [phone] with 2L %  Patient Saturations on 2 Liters of oxygen while Ambulating = 86-87%, came up to 95 on 3L   Please briefly explain why patient needs home oxygen: Patient will need home oxygen due to while walking he required oxygen at 3L up to 95%

## 2022-12-23 NOTE — Progress Notes (Signed)
[facility] PROGRESS NOTE  [name] (DOB: January 29, 1941) HYQ:[id number] PCP: Elfredia Nevins, MD Outpatient Specialists: Oncology, Dr. Ellin Saba; Pulmonary, Dr. Wynona Neat.   Brief Narrative: [name]  is an 82 y.o. male, with history of stage IV bladder cancer with metastasis to bones and lungs, s/p cystoprostatectomy, with lower quadrant ileal conduit formation, diverticulosis, atrial fibrillation with anticoagulation on apixaban, GERD who came to ED with generalized weakness, persistent shortness of breath and poor appetite.  Patient was seen in oncology clinic this morning where he underwent right sided thoracentesis ordered by oncologist Dr. Ellin Saba.  1.8 L fluid was removed from right pleural effusion.  Patient is scheduled to undergo left thoracentesis tomorrow.  Recently completed course of Augmentin without any improvement.  He was admitted with acute hypoxemic respiratory failure-multifactorial from bilateral pleural effusion with associated multi focal pneumonia.  He underwent left-sided thoracentesis with 700 mL of clear yellow fluid drained 5/7. This was again exudative by light's criteria with negative gram stain, no culture growth  Subjective: Breathing better, denies orthopnea. Getting echo this morning. Had hours-long coughing spell last night despite antitussives.  Objective: BP 111/70   Pulse 99   Temp 98.1 F (36.7 C)   Resp 18   Ht 5\' 11"  (1.803 m)   Wt 78.5 kg   SpO2 96%   BMI 24.13 kg/m   Gen: No distress, elderly Pulm: Clear bilaterally while getting echo performed, nonlabored still tachypneic. Diminished at bases anterolaterally. CV: Reg, no MRG, no pitting edema GI: Soft, NT, ND, +BS  Neuro: Alert and oriented. No new focal deficits. Ext: Warm, no deformities. Skin: No new rashes, lesions or ulcers on visualized skin   Assessment & Plan: Acute hypoxic respiratory failure: Multifactorial, due to multifocal pneumonia, bilateral pleural effusions on  baseline severe COPD without bronchodilator response on PFTs, mild restriction and mildly reduced DLCO as well.  - Still hypoxic with ambulation despite thoracenteses, sufficient oxygenation with 3L O2 with ambulation which will be arranged at discharge. No bronchospasm on exam and had no significant response to BD in PFTs but we can try albuterol if patient wishes. No PE on CTA this admission.  Will repeat ambulatory pulse oximetry again tomorrow. Continue IS and frequent ambulation/OOB.  - With recurrent pleural effusions (note by pulmonary, Dr. Wynona Neat on 12/10/2022 reviewed) that have been exudative in the past (with negative cytology) and exudative on 5/7 (protein 3.3, serum 5.3) by Light's criteria, again with no malignant cells on cytology and negative gram stain, culture NGTD.   - Echo checked though BNP is normal and limited views due to COPD show preserved, stable LV systolic function. G1DD noted, not clinically in heart failure. - Continue ceftriaxone, azithromycin (day 4 today) WBC is normal. D-dimer at age-adjusted ULN, negative CTA 5/6.  - Start scheduled dextromethorphan, prn tessalon, increase dose. Can give tussionex prn severe coughing spells. Anticipate patient will continue to have cough to some degree, counseled patient and spouse. Augmenting PPI to BID, not on ACE/ARB.  - Has pulmonary follow up to establish care in [place].   Atrial fibrillation:  - Continue metoprolol, eliquis  GERD:  - Continue PPI  Stage IV bladder CA: s/p cystectomy, ileal conduit. Has mets to bone. Also intrathoracic LN which was treated with radiation x2.  - Notified Dr. Kirtland Bouchard of patient's hospitalization and need to reschedule office appointment on 5/9. Plans to see patient 5/9 which is greatly appreciated.  Tyrone Nine, MD [facility] [email or web address] 12/23/2022, 11:27 AM

## 2022-12-23 NOTE — Progress Notes (Signed)
   12/23/22 2154  Vitals  BP (!) 107/59  Pulse Rate 75  MEWS COLOR  MEWS Score Color Green  MEWS Score  MEWS Temp 0  MEWS Systolic 0  MEWS Pulse 0  MEWS RR 0  MEWS LOC 0  MEWS Score 0    Per MD Zierle-Ghosh hold BP med at this time. See MAR.

## 2022-12-23 NOTE — Progress Notes (Signed)
Pt's wife concerned about his persistent cough. PRN Tessalon given with relief. Pt ambulated in room with assist. Vitals stable during the night.

## 2022-12-23 NOTE — Progress Notes (Signed)
  Echocardiogram 2D Echocardiogram has been performed.  Maren Reamer 12/23/2022, 9:43 AM

## 2022-12-24 DIAGNOSIS — J189 Pneumonia, unspecified organism: Secondary | ICD-10-CM | POA: Diagnosis not present

## 2022-12-24 LAB — CULTURE, BODY FLUID W GRAM STAIN -BOTTLE

## 2022-12-24 LAB — CULTURE, BLOOD (ROUTINE X 2)

## 2022-12-24 MED ORDER — DM-GUAIFENESIN ER 30-600 MG PO TB12: 1.0000 | ORAL_TABLET | Freq: Two times a day (BID) | ORAL | Status: DC

## 2022-12-24 MED ORDER — CEFDINIR 300 MG PO CAPS: 300.0000 mg | ORAL_CAPSULE | Freq: Two times a day (BID) | ORAL | 0 refills | Status: AC

## 2022-12-24 MED ORDER — HYDROCOD POLI-CHLORPHE POLI ER 10-8 MG/5ML PO SUER: 5.0000 mL | Freq: Two times a day (BID) | ORAL | 0 refills | Status: DC | PRN

## 2022-12-24 MED ORDER — PANTOPRAZOLE SODIUM 40 MG PO TBEC: 40.0000 mg | DELAYED_RELEASE_TABLET | Freq: Two times a day (BID) | ORAL | 0 refills | Status: DC

## 2022-12-24 NOTE — Discharge Summary (Signed)
Physician Discharge Summary   Patient: [name] MRN: [id number] DOB: 15-Mar-1941  Admit date:     12/20/2022  Discharge date: 12/24/22  Discharge Physician: Tyrone Nine   PCP: Elfredia Nevins, MD   Recommendations at discharge:  Follow up with PCP in the next 1-2 weeks, consider repeat BMP, CBC. Follow up with Dr. Ellin Saba, oncology, 5/23. Possibility of initiating keytruda at that time after infection completely treated. Follow up with Dr. Craige Cotta, pulmonology, 5/30 for chronic cough, recurrent pleural effusion, COPD evaluation. Discharged without exertional hypoxia here.  Discharge Diagnoses: Principal Problem:   CAP (community acquired pneumonia) Active Problems:   Bladder cancer metastasized to lung [facility])   Acute hypoxemic respiratory failure [facility])  Hospital Course: [name]  is an 82 y.o. male, with history of stage IV bladder cancer with metastasis to bones and lungs, s/p cystoprostatectomy, with lower quadrant ileal conduit formation, diverticulosis, atrial fibrillation with anticoagulation on apixaban, GERD who came to ED with generalized weakness, persistent shortness of breath and poor appetite.  Patient was seen in oncology clinic this morning where he underwent right sided thoracentesis ordered by oncologist Dr. Ellin Saba.  1.8 L fluid was removed from right pleural effusion.  Patient is scheduled to undergo left thoracentesis tomorrow.  Recently completed course of Augmentin without any improvement.  He was admitted with acute hypoxemic respiratory failure-multifactorial from bilateral pleural effusion with associated multi focal pneumonia.  He underwent left-sided thoracentesis with 700 mL of clear yellow fluid drained 5/7. This was again exudative by light's criteria with negative gram stain, no culture growth, negative cytology. His symptoms have improved, and he is cleared for discharge with plans for oncology and pulmonology follow up in the coming weeks. Please see  below for details.  Assessment and Plan: Acute hypoxic respiratory failure: Multifactorial, due to multifocal pneumonia, bilateral pleural effusions on baseline severe COPD without bronchodilator response on PFTs, mild restriction and mildly reduced DLCO as well.  - Continue IS and frequent ambulation/OOB.  - With recurrent pleural effusions (note by pulmonary, Dr. Wynona Neat on 12/10/2022 reviewed) that have been exudative in the past (with negative cytology) and exudative on 5/7 (protein 3.3, serum 5.3) by Light's criteria, again with no malignant cells on cytology and negative gram stain, culture NGTD.   - Echo checked though BNP is normal and limited views due to COPD show preserved, stable LV systolic function. G1DD noted, not clinically in heart failure. - Improved on ceftriaxone, azithromycin (day 5 today). WBC is normal. D-dimer at age-adjusted ULN, negative CTA 5/6. Given presence of effusions, will complete an additional 5 days of omnicef. - Continue mucinex DM, augmented PPI to BID empirically. Not on ACE/ARB. Can give tussionex prn severe coughing spells.  - Has pulmonary follow up to establish care in [place] with Dr. Craige Cotta 5/30.    Atrial fibrillation:  - Continue metoprolol, eliquis   GERD:  - Continue PPI   Stage IV bladder CA: s/p cystectomy, ileal conduit. Has mets to bone. Also intrathoracic LN which was treated with radiation x2.  - Dr. Ellin Saba spoke with patient and family at length 5/9. Will follow up after discharge.  Notified Dr. Kirtland Bouchard of patient's hospitalization and need to reschedule office appointment on 5/9. Plans to see patient 5/9 which is greatly appreciated.  Consultants: Oncology, Dr. Ellin Saba on 5/9 Procedures performed: Thoracentesis 5/6 (1.8L right sided) and 5/7 (700cc left sided) Disposition: Home, home health ordered but may be declined by patient Diet recommendation:  Cardiac diet DISCHARGE MEDICATION: Allergies as  of 12/24/2022       Reactions    Ciprofloxacin Hives   Zofran [ondansetron] Rash        Medication List     STOP taking these medications    amoxicillin-clavulanate 875-125 MG tablet Commonly known as: AUGMENTIN   benzonatate 200 MG capsule Commonly known as: TESSALON   methenamine 1 g tablet Commonly known as: Hiprex   oxyCODONE 5 MG immediate release tablet Commonly known as: Oxy IR/ROXICODONE       TAKE these medications    acetaminophen 500 MG tablet Commonly known as: TYLENOL Take 1,000 mg by mouth every 6 (six) hours as needed for mild pain, fever, moderate pain or headache.   apixaban 5 MG Tabs tablet Commonly known as: ELIQUIS Take 1 tablet (5 mg total) by mouth 2 (two) times daily.   cefdinir 300 MG capsule Commonly known as: OMNICEF Take 1 capsule (300 mg total) by mouth 2 (two) times daily for 5 days.   chlorpheniramine-HYDROcodone 10-8 MG/5ML Commonly known as: TUSSIONEX Take 5 mLs by mouth every 12 (twelve) hours as needed (severe cough).   dextromethorphan-guaiFENesin 30-600 MG 12hr tablet Commonly known as: MUCINEX DM Take 1 tablet by mouth 2 (two) times daily.   metoprolol tartrate 25 MG tablet Commonly known as: LOPRESSOR Take 1 tablet (25 mg total) by mouth 2 (two) times daily.   pantoprazole 40 MG tablet Commonly known as: PROTONIX Take 1 tablet (40 mg total) by mouth 2 (two) times daily. What changed: when to take this        Follow-up Information     [employer]. Call.   Why: Call number and press for option 1. They will be able to assist with private paid transportation if needed. Contact information: [phone]        Elfredia Nevins, MD. Call.   Specialty: Internal Medicine Why: Call PCP if you change your mind about home health needs. Contact information: [address] [place] [address] [phone]         Doreatha Massed, MD Follow up on 01/06/2023.   Specialty: Hematology Contact information: [address] [place] [address] [phone]         Coralyn Helling, MD. Go on 01/13/2023.   Specialties: Pulmonary Disease, Sleep Medicine Contact information: [address] [place] [address] [phone]                Discharge Exam: Filed Weights   12/20/22 1050 12/20/22 2035  Weight: 82.5 kg 78.5 kg  BP 116/66 (BP Location: Left Arm)   Pulse (!) 102   Temp 97.7 F (36.5 C) (Oral)   Resp 18   Ht 5\' 11"  (1.803 m)   Wt 78.5 kg   SpO2 92%   BMI 24.13 kg/m   Elderly pleasant male in chair in no distress breathing room air with normal respiratory effort, clear lung sounds  Condition at discharge: stable  The results of significant diagnostics from this hospitalization (including imaging, microbiology, ancillary and laboratory) are listed below for reference.   Imaging Studies: ECHOCARDIOGRAM COMPLETE  Result Date: 12/23/2022    ECHOCARDIOGRAM REPORT   Patient Name:   [name] Date of Exam: 12/23/2022 Medical Rec #:  [id number]      Height:       71.0 in Accession #:    [id number]     Weight:       173.0 lb Date of Birth:  10-03-1940      BSA:  1.983 m Patient Age:    81 years       BP:           111/70 mmHg Patient Gender: M              HR:           104 bpm. [facility] Procedure: 2D Echo, Cardiac Doppler, Color Doppler and Intracardiac            Opacification Agent Indications:    Congestive Heart Failure I50.9  History:        Patient has prior history of Echocardiogram examinations, most                 recent 06/22/2022. CAP, Signs/Symptoms:Shortness of Breath; Risk                 Factors:Former Smoker.                 Stage IV malignant neoplasm of urinary bladder /with mets to the                 bones and lungs.  Sonographer:    Aron Baba Referring Phys: [id number] Tyrone Nine  Sonographer Comments: Technically difficult study due to poor echo windows. Image acquisition challenging due to patient body habitus and Image acquisition challenging due  to COPD. IMPRESSIONS  1. Left ventricular ejection fraction, by estimation, is 60 to 65%. The left ventricle has normal function. The left ventricle has no regional wall motion abnormalities. Left ventricular diastolic parameters are consistent with Grade I diastolic dysfunction (impaired relaxation).  2. Right ventricular systolic function is normal. The right ventricular size is mildly enlarged. Tricuspid regurgitation signal is inadequate for assessing PA pressure.  3. The mitral valve is grossly normal. Trivial mitral valve regurgitation.  4. The aortic valve was not well visualized. There is moderate calcification of the aortic valve. Aortic valve regurgitation is not visualized.  5. The inferior vena cava is normal in size with greater than 50% respiratory variability, suggesting right atrial pressure of 3 mmHg. Comparison(s): Prior images reviewed side by side. Images are very limited. LVEF remains normal range at 60-65%. FINDINGS  Left Ventricle: Left ventricular ejection fraction, by estimation, is 60 to 65%. The left ventricle has normal function. The left ventricle has no regional wall motion abnormalities. Definity contrast agent was given IV to delineate the left ventricular  endocardial borders. The left ventricular internal cavity size was normal in size. There is no left ventricular hypertrophy. Left ventricular diastolic parameters are consistent with Grade I diastolic dysfunction (impaired relaxation). Right Ventricle: The right ventricular size is mildly enlarged. No increase in right ventricular wall thickness. Right ventricular systolic function is normal. Tricuspid regurgitation signal is inadequate for assessing PA pressure. Left Atrium: Left atrial size was normal in size. Right Atrium: Right atrial size was normal in size. Pericardium: There is no evidence of pericardial effusion. Presence of epicardial fat layer. Mitral Valve: The mitral valve is grossly normal. Trivial mitral valve  regurgitation. Tricuspid Valve: The tricuspid valve is not well visualized. Tricuspid valve regurgitation is trivial. Aortic Valve: The aortic valve was not well visualized. There is moderate calcification of the aortic valve. Aortic valve regurgitation is not visualized. Pulmonic Valve: The pulmonic valve was not well visualized. Pulmonic valve regurgitation is trivial. Aorta: The aortic root is normal in size and structure. Venous: The inferior vena cava is normal in size with greater than 50% respiratory variability, suggesting  right atrial pressure of 3 mmHg. IAS/Shunts: No atrial level shunt detected by color flow Doppler.  LEFT VENTRICLE PLAX 2D LVOT diam:     2.10 cm     Diastology LV SV:         56          LV e' medial:    6.49 cm/s LV SV Index:   28          LV E/e' medial:  10.2 LVOT Area:     3.46 cm    LV e' lateral:   7.38 cm/s                            LV E/e' lateral: 9.0  LV Volumes (MOD) LV vol d, MOD A2C: 63.1 ml LV vol d, MOD A4C: 84.2 ml LV vol s, MOD A2C: 14.0 ml LV vol s, MOD A4C: 30.2 ml LV SV MOD A2C:     49.1 ml LV SV MOD A4C:     84.2 ml LV SV MOD BP:      53.9 ml RIGHT VENTRICLE RV S prime:     11.10 cm/s TAPSE (M-mode): 1.1 cm LEFT ATRIUM           Index        RIGHT ATRIUM           Index LA diam:      2.00 cm 1.01 cm/m   RA Area:     12.50 cm LA Vol (A2C): 13.1 ml 6.61 ml/m   RA Volume:   29.60 ml  14.93 ml/m LA Vol (A4C): 28.1 ml 14.17 ml/m  AORTIC VALVE LVOT Vmax:   90.70 cm/s LVOT Vmean:  67.500 cm/s LVOT VTI:    0.161 m  AORTA Ao Root diam: 3.60 cm MITRAL VALVE MV Area (PHT): 4.49 cm    SHUNTS MV Decel Time: 169 msec    Systemic VTI:  0.16 m MV E velocity: 66.40 cm/s  Systemic Diam: 2.10 cm MV A velocity: 86.10 cm/s MV E/A ratio:  0.77 Nona Dell MD Electronically signed by Nona Dell MD Signature Date/Time: 12/23/2022/10:33:41 AM    Final    DG Chest Port 1 View  Result Date: 12/21/2022 CLINICAL DATA:  Shortness of breath. EXAM: PORTABLE CHEST 1 VIEW COMPARISON:   Chest radiograph dated 12/21/2022. FINDINGS: Small bilateral pleural effusions with bibasilar atelectasis or infiltrate. No pneumothorax. Stable cardiac silhouette. No acute osseous pathology. IMPRESSION: Small bilateral pleural effusions with bibasilar atelectasis or infiltrate. Electronically Signed   By: Elgie Collard M.D.   On: 12/21/2022 23:54   US THORACENTESIS ASP PLEURAL SPACE W/IMG GUIDE  Result Date: 12/21/2022 INDICATION: Patient with a history of metastatic bladder cancer with pleural effusions. Interventional Radiology asked to perform a diagnostic and therapeutic left thoracentesis. Patient is s/p Right thoracentesis 12/20/22. EXAM: ULTRASOUND GUIDED THORACENTESIS MEDICATIONS: 1% lidocaine 10 ml COMPLICATIONS: None immediate. PROCEDURE: An ultrasound guided thoracentesis was thoroughly discussed with the patient and questions answered. The benefits, risks, alternatives and complications were also discussed. The patient understands and wishes to proceed with the procedure. Written consent was obtained. Ultrasound was performed to localize and mark an adequate pocket of fluid in the left chest. The area was then prepped and draped in the normal sterile fashion. 1% Lidocaine was used for local anesthesia. Under ultrasound guidance a 6 Fr Safe-T-Centesis catheter was introduced. Thoracentesis was performed. The catheter was removed and a dressing applied. FINDINGS: A total  of approximately 700 ml of clear yellow fluid was removed. Samples were sent to the laboratory as requested by the clinical team. IMPRESSION: Successful ultrasound guided left thoracentesis yielding 700 ml of pleural fluid. Read by: Alwyn Ren, NP Electronically Signed   By: Ulyses Southward M.D.   On: 12/21/2022 11:38   DG Chest 1 View  Result Date: 12/21/2022 CLINICAL DATA:  Post thoracentesis EXAM: CHEST  1 VIEW COMPARISON:  12/20/2022 FINDINGS: Normal heart size and mediastinal contours. Tiny residual bibasilar effusions.  Accentuated LEFT perihilar markings stable. No acute infiltrate or pneumothorax. IMPRESSION: No pneumothorax following thoracentesis. Electronically Signed   By: Ulyses Southward M.D.   On: 12/21/2022 10:49   CT Angio Chest PE W and/or Wo Contrast  Result Date: 12/20/2022 CLINICAL DATA:  Productive cough and worsening shortness of breath with worsening weakness. Status post thoracentesis EXAM: CT ANGIOGRAPHY CHEST WITH CONTRAST TECHNIQUE: Multidetector CT imaging of the chest was performed using the standard protocol during bolus administration of intravenous contrast. Multiplanar CT image reconstructions and MIPs were obtained to evaluate the vascular anatomy. RADIATION DOSE REDUCTION: This exam was performed according to the departmental dose-optimization program which includes automated exposure control, adjustment of the mA and/or kV according to patient size and/or use of iterative reconstruction technique. CONTRAST:  75mL OMNIPAQUE IOHEXOL 350 MG/ML SOLN COMPARISON:  X-ray earlier 12/20/2022. Chest CT with contrast 12/16/2022. Older exams as well. FINDINGS: Cardiovascular: Heart is nonenlarged. Trace pericardial fluid. Normal caliber thoracic aorta with mild vascular calcifications. Coronary artery calcifications are seen. There is significant motion throughout the examination. This is nondiagnostic for small and peripheral emboli. No segmental or larger pulmonary embolism identified at this time. Mediastinum/Nodes: Small hiatal hernia. Normal caliber thoracic esophagus. Small thyroid gland. No specific abnormal lymph node enlargement seen in the axillary regions, hilum or mediastinum. Lungs/Pleura: Moderate left and small right pleural effusion identified with the adjacent lung opacities. The right effusion is significantly improved status post thoracentesis. Patchy ground-glass once again seen in the left upper lobe medial and perihilar. Similar distribution but slightly more confluence today. Associated areas  of bronchial wall thickening in this location. There is also a left upper lobe anterior spiculated nodule which is stable. Series 5, image 75 this measures 11 x 7 mm. There is patchy parenchymal opacities in the right upper lung extending to the apex and medially along the right hemithorax extending down to the hilum with narrowing of bronchi. Associated bronchial wall thickening. These areas are similar when adjusting for technique and with the decreased level of the effusion today. There is also some bronchial wall thickening in the right lower lobe but improved aeration to the right lower lobe of the lung status post thoracentesis. Upper Abdomen: The adrenal glands are incompletely included in the imaging field. Benign hepatic cysts. Musculoskeletal: Gynecomastia. Scattered degenerative changes along the spine. Old right-sided rib fractures. Review of the MIP images confirms the above findings. IMPRESSION: Improved right pleural effusion with a small residual status post thoracentesis. No pneumothorax. Persistent left-sided pleural effusion. Multifocal areas of parenchymal opacity are again noted. Areas in the upper lung zones has a similar distribution. Focus on the left is slightly more confluence, perihilar anteriorly. Separate spiculated 11 mm left upper lobe lung nodule. Recommend continued follow up based on prior workup. Significant breathing motion. No segmental or larger pulmonary embolism. Aortic Atherosclerosis (ICD10-I70.0) and Emphysema (ICD10-J43.9). Electronically Signed   By: Karen Kays M.D.   On: 12/20/2022 18:18   US THORACENTESIS ASP PLEURAL SPACE W/IMG  GUIDE  Result Date: 12/20/2022 INDICATION: 82 year old male with history of metastatic bladder cancer. EXAM: ULTRASOUND GUIDED RIGHT THORACENTESIS MEDICATIONS: 10 mL 1% lidocaine COMPLICATIONS: None immediate. PROCEDURE: An ultrasound guided thoracentesis was thoroughly discussed with the patient and questions answered. The benefits, risks,  alternatives and complications were also discussed. The patient understands and wishes to proceed with the procedure. Written consent was obtained. Ultrasound was performed to localize and mark an adequate pocket of fluid in the right chest. The area was then prepped and draped in the normal sterile fashion. 1% Lidocaine was used for local anesthesia. Under ultrasound guidance a 6 Fr Safe-T-Centesis catheter was introduced. Thoracentesis was performed. The catheter was removed and a dressing applied. FINDINGS: A total of approximately 1.8 liters of dark yellow, clear fluid was removed. IMPRESSION: Successful ultrasound guided right thoracentesis yielding 1.8 liters of pleural fluid. Read by: Loyce Dys PA-C Electronically Signed   By: Ulyses Southward M.D.   On: 12/20/2022 10:58   DG Chest 1 View  Result Date: 12/20/2022 CLINICAL DATA:  Post thoracentesis EXAM: CHEST  1 VIEW COMPARISON:  11/29/2022 FINDINGS: Normal heart size, mediastinal contours, and pulmonary vascularity. Small LEFT pleural effusion and minimal LEFT basilar atelectasis. Lungs otherwise clear. No acute infiltrate or pneumothorax. Bones demineralized. IMPRESSION: No pneumothorax following thoracentesis. Electronically Signed   By: Ulyses Southward M.D.   On: 12/20/2022 10:55   CT Chest W Contrast  Result Date: 12/16/2022 CLINICAL DATA:  Staging of bladder cancer.  Lung infiltrates. * Tracking Code: BO * EXAM: CT CHEST WITH CONTRAST TECHNIQUE: Multidetector CT imaging of the chest was performed during intravenous contrast administration. RADIATION DOSE REDUCTION: This exam was performed according to the departmental dose-optimization program which includes automated exposure control, adjustment of the mA and/or kV according to patient size and/or use of iterative reconstruction technique. CONTRAST:  75mL OMNIPAQUE IOHEXOL 300 MG/ML  SOLN COMPARISON:  PET-CT 11/25/2022 FINDINGS: Cardiovascular: Coronary, aortic arch, and branch vessel atherosclerotic  vascular disease. Mediastinum/Nodes: Unremarkable Lungs/Pleura: Large bilateral pleural effusions. Atelectasis medially in the right upper lobe and right lung apex. A left upper lobe nodule measures 1.0 by 0.7 cm on image 37 series 4, previously 1.2 by 0.6 cm on 06/21/2022. This was not appreciably hypermetabolic on 11/25/2022. 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 Upper Abdomen: Abdominal aortic atherosclerosis. Otherwise no significant findings. Musculoskeletal: Old healed right lateral rib fractures. Bilateral gynecomastia. IMPRESSION: 1. Large bilateral pleural effusions. 2. 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 3. Stable 1.0 by 0.7 cm left upper lobe nodule. This has not changed substantially over the last 4 years. This nodule was not appreciably hypermetabolic on 11/25/2022. 4. Bilateral gynecomastia. 5. Aortic arch and branch vessel atherosclerotic vascular disease. 6. Old healed right lateral rib fractures. 7. Atelectasis medially in the right upper lobe and right lung apex. Aortic Atherosclerosis (ICD10-I70.0). Electronically Signed   By: Gaylyn Rong M.D.   On: 12/16/2022 18:02   US THORACENTESIS ASP PLEURAL SPACE W/IMG GUIDE  Result Date: 11/29/2022 INDICATION: Right pleural effusion.  Previous right thoracentesis last November. EXAM: ULTRASOUND GUIDED RIGHT THORACENTESIS MEDICATIONS: None. COMPLICATIONS: None immediate. PROCEDURE: An ultrasound guided thoracentesis was thoroughly discussed with the patient and questions answered. The benefits, risks, alternatives and complications were also discussed. The patient understands and wishes to proceed with the procedure. Written consent was obtained. Ultrasound was performed to localize and mark an adequate pocket of  fluid in the right chest. The area was then prepped and draped in the normal  sterile fashion. 1% Lidocaine was used for local anesthesia. Under ultrasound guidance a 6 Fr Safe-T-Centesis catheter was introduced. Thoracentesis was performed. The catheter was removed and a dressing applied. FINDINGS: A total of approximately 1.5 L of serous pleural fluid was removed. Samples were sent to the laboratory as requested by the clinical team. IMPRESSION: Successful ultrasound guided right thoracentesis yielding 1.5 L of pleural fluid. Electronically Signed   By: Obie Dredge M.D.   On: 11/29/2022 13:33   DG Chest 1 View  Result Date: 11/29/2022 CLINICAL DATA:  Status post right thoracentesis. EXAM: CHEST  1 VIEW COMPARISON:  PET-CT dated November 25, 2022. Chest x-ray dated November 15, 2022. FINDINGS: Stable cardiomediastinal silhouette with normal heart size. Decreased now small right and unchanged small left pleural effusions. Mild right and minimal left basilar atelectasis. No pneumothorax. No acute osseous abnormality. IMPRESSION: 1. Decreased now small right and unchanged small left pleural effusions. No pneumothorax. Electronically Signed   By: Obie Dredge M.D.   On: 11/29/2022 13:32   NM PET Image Restag (PS) Skull Base To Thigh  Result Date: 11/29/2022 CLINICAL DATA:  Subsequent treatment strategy for bladder cancer. EXAM: NUCLEAR MEDICINE PET SKULL BASE TO THIGH TECHNIQUE: 10.35 mCi F-18 FDG was injected intravenously. Full-ring PET imaging was performed from the skull base to thigh after the radiotracer. CT data was obtained and used for attenuation correction and anatomic localization. Fasting blood glucose: 103 mg/dl COMPARISON:  PET-CT 82/95/6213 and 03/15/2022. FINDINGS: Mediastinal blood pool activity: SUV max NECK: No hypermetabolic cervical lymph nodes are identified. No suspicious activity identified within the pharyngeal mucosal space. Incidental CT findings: Bilateral carotid atherosclerosis. CHEST: No significant residual hypermetabolic mediastinal, hilar or axillary  lymph nodes. Interval decreased activity within a previously demonstrated mildly hypermetabolic subcarinal lymph node (currently with an SUV max of 4.3, previously 5.1). Chronic nodule anteriorly in the left upper lobe is stable, measuring 8 mm on image 26/7, without hypermetabolic activity. There are new patchy ground-glass opacities medially in both upper lobes with associated low level metabolic activity (up to an SUV max of 4.1 on the right). These are likely inflammatory/infectious. No suspicious pulmonary nodularity. Incidental CT findings: Enlarging bilateral pleural effusions, moderate to large on the right and moderate on the left. Associated increased dependent atelectasis in both lungs. No residual focal abnormality identified anterior to the left hilum. Atherosclerosis of the aorta, great vessels and coronary arteries. ABDOMEN/PELVIS: There is no hypermetabolic activity within the liver, adrenal glands, spleen or pancreas. Low level activity in the adrenal glands bilaterally is unchanged and within physiologic limits. There is no hypermetabolic nodal activity in the abdomen or pelvis. Stable anorectal activity, also likely physiologic. Incidental CT findings: Stable hepatic cysts, small gallstones, aortic and branch vessel atherosclerosis and postsurgical changes related to prior cystectomy and urinary diversion. SKELETON: Stable treated expansile lytic lesion in the right inferior pubic ramus and ischium without recurrent hypermetabolic activity. No new osseous lesions are identified to suggest metastatic disease. Incidental CT findings: Stable mild spondylosis and moderate bilateral gynecomastia. IMPRESSION: 1. No evidence of local recurrence of bladder cancer or metastatic disease. 2. A previously demonstrated mildly hypermetabolic subcarinal lymph node has decreased in metabolic activity. 3. New patchy ground-glass opacities medially in both upper lobes with associated low level metabolic activity,  likely inflammatory/infectious. 4. Enlarging bilateral pleural effusions with increased dependent atelectasis in both lungs. 5. Stable treated lytic lesion in the  right inferior pubic ramus and ischium. 6.  Aortic Atherosclerosis (ICD10-I70.0). Electronically Signed   By: Carey Bullocks M.D.   On: 11/29/2022 10:42    Microbiology: Results for orders placed or performed during the hospital encounter of 12/20/22  Urine Culture     Status: Abnormal   Collection Time: 12/20/22  1:44 PM   Specimen: Urine, Suprapubic  Result Value Ref Range Status   Specimen Description   Final    URINE, SUPRAPUBIC Performed at [facility], [address] [place], [place] [address]    Special Requests   Final    NONE Reflexed from [id number] Performed at [facility], [address] [place], [place] [address]    Culture MULTIPLE SPECIES PRESENT, SUGGEST RECOLLECTION (A)  Final   Report Status 12/21/2022 FINAL  Final  Culture, blood (Routine X 2) w Reflex to ID Panel     Status: None (Preliminary result)   Collection Time: 12/20/22  7:59 PM   Specimen: BLOOD  Result Value Ref Range Status   Specimen Description BLOOD BLOOD LEFT ARM  Final   Special Requests   Final    BOTTLES DRAWN AEROBIC AND ANAEROBIC Blood Culture adequate volume   Culture   Final    NO GROWTH 4 DAYS Performed at [facility], [address] [place], [place] [address]    Report Status PENDING  Incomplete  Culture, blood (Routine X 2) w Reflex to ID Panel     Status: None (Preliminary result)   Collection Time: 12/20/22  8:02 PM   Specimen: BLOOD  Result Value Ref Range Status   Specimen Description BLOOD BLOOD LEFT HAND  Final   Special Requests   Final    BOTTLES DRAWN AEROBIC AND ANAEROBIC Blood Culture adequate volume   Culture   Final    NO GROWTH 4 DAYS Performed at [facility], [address], [place], [place] [address]    Report Status PENDING  Incomplete  Gram stain     Status: None   Collection Time: 12/21/22  10:10 AM   Specimen: Pleura  Result Value Ref Range Status   Specimen Description PLEURAL  Final   Special Requests NONE  Final   Gram Stain   Final    NO ORGANISMS SEEN CYTOSPIN SMEAR WBC PRESENT, PREDOMINANTLY MONONUCLEAR Performed at [facility], [address] [place], [place] [address]    Report Status 12/21/2022 FINAL  Final  Culture, body fluid w Gram Stain-bottle     Status: None (Preliminary result)   Collection Time: 12/21/22 10:10 AM   Specimen: Pleura  Result Value Ref Range Status   Specimen Description PLEURAL  Final   Special Requests 10CC BOTTLES DRAWN AEROBIC AND ANAEROBIC  Final   Culture   Final    NO GROWTH 3 DAYS Performed at [facility], [address], [place], [place] [address]    Report Status PENDING  Incomplete    Labs: CBC: Recent Labs  Lab 12/20/22 1345 12/21/22 0409 12/22/22 0155  WBC 6.5 6.3 5.6  NEUTROABS 5.1  --   --   HGB 13.6 12.4* 12.4*  HCT 41.2 38.0* 37.3*  MCV 107.9* 107.3* 107.2*  PLT 282 239 228   Basic Metabolic Panel: Recent Labs  Lab 12/20/22 1345 12/21/22 0409 12/22/22 0155 12/23/22 0412  NA 136 135 135 135  K 4.5 4.1 3.9 4.0  CL 98 101 101 102  CO2 28 26 27 27   GLUCOSE 100* 93 107* 102*  BUN 21 17 13  11  CREATININE 1.08 1.00 0.95 0.89  CALCIUM 8.8* 8.5* 8.3* 8.5*  MG 2.5*  --  2.2  --    Liver Function Tests: Recent Labs  Lab 12/20/22 1345 12/21/22 0409  AST 27 22  ALT 27 21  ALKPHOS 55 45  BILITOT 1.0 0.8  PROT 6.5 5.3*  ALBUMIN 2.8* 2.4*   CBG: Recent Labs  Lab 12/20/22 2123 12/21/22 2147  GLUCAP 132* 102*    Discharge time spent: greater than 30 minutes.  Signed: Tyrone Nine, MD [facility] 12/24/2022

## 2022-12-24 NOTE — TOC Transition Note (Signed)
Transition of Care [facility]) - CM/SW Discharge Note   Patient Details  Name: [name] MRN: [id number] Date of Birth: 09/21/1940  [facility] CM/SW Contact:  Villa Herb, LCSWA Phone Number: 12/24/2022, 12:36 PM  Clinical Narrative:    CSW updated that pt will not need to D/C home with home O2. Pt and wife are not interested in [facility] being set up prior to D/C. CSW did add information for how to follow up about HH and community transportation. TOC signing off.   Final next level of care: Home/Self Care Barriers to Discharge: Continued Medical Work up   Patient Goals and CMS Choice CMS Medicare.gov Compare Post Acute Care list provided to:: Patient Choice offered to / list presented to : Patient  Discharge Placement                         Discharge Plan and Services Additional resources added to the After Visit Summary for   In-house Referral: Clinical Social Work Discharge Planning Services: CM Consult Post Acute Care Choice: Durable Medical Equipment                               Social Determinants of Health (SDOH) Interventions SDOH Screenings   Food Insecurity: No Food Insecurity (12/20/2022)  Housing: Low Risk  (12/20/2022)  Transportation Needs: No Transportation Needs (12/20/2022)  Utilities: Not At Risk (12/20/2022)  Depression (PHQ2-9): Low Risk  (08/17/2022)  Tobacco Use: Medium Risk (12/21/2022)     Readmission Risk Interventions     No data to display

## 2022-12-24 NOTE — Progress Notes (Signed)
Patient vitals stable, green mews. No complaints of pain. Patient has slept the majority of the shift. Per patient wife, patient hasn't gotten much sleep since he was admitted on 5/6.

## 2022-12-24 NOTE — Care Management Important Message (Signed)
Important Message  Patient Details  Name: KAILER ZIPAY MRN: [id number] Date of Birth: 01/08/1941   Medicare Important Message Given:  Yes     Corey Harold 12/24/2022, 9:45 AM

## 2022-12-25 LAB — CULTURE, BLOOD (ROUTINE X 2): Special Requests: ADEQUATE

## 2022-12-25 LAB — CULTURE, BODY FLUID W GRAM STAIN -BOTTLE: Culture: NO GROWTH

## 2022-12-26 LAB — CULTURE, BODY FLUID W GRAM STAIN -BOTTLE

## 2022-12-27 ENCOUNTER — Inpatient Hospital Stay: Payer: Medicare Other | Admitting: Hematology

## 2023-01-05 ENCOUNTER — Other Ambulatory Visit: Payer: Self-pay

## 2023-01-05 DIAGNOSIS — D539 Nutritional anemia, unspecified: Secondary | ICD-10-CM

## 2023-01-05 DIAGNOSIS — C771 Secondary and unspecified malignant neoplasm of intrathoracic lymph nodes: Secondary | ICD-10-CM

## 2023-01-05 NOTE — Progress Notes (Signed)
[name] [address], [place] [address]    Clinic Day:  01/06/2023  Referring physician: Elfredia Nevins, MD  Patient Care Team: Elfredia Nevins, MD as PCP - General (Internal Medicine) Marinus Maw, MD as PCP - Electrophysiology (Cardiology) Doreatha Massed, MD as Medical Oncologist (Medical Oncology) Therese Sarah, RN as Oncology Nurse Navigator (Medical Oncology)   ASSESSMENT & PLAN:   Assessment: 1.  Metastatic urothelial carcinoma of the bladder to the lungs and bones: - Neoadjuvant chemotherapy with gemcitabine and cisplatin cycle 1 on 05/02/2017 - Cystoprostatectomy, bilateral lymphadenectomy by Dr. Berneice Heinrich on 06/29/2017, pathology T3b N0, 0/13 lymph nodes - Developed metastatic disease in April 2019, biopsy of the right ischium on 12/08/2017 with poorly differentiated carcinoma consistent with metastatic urothelial carcinoma. - XRT to the pelvis completed on 01/16/2018 - Carboplatin and gemcitabine 6 cycles from 01/30/2018 through 05/22/2018 - Pembrolizumab every 3 weeks started on 06/21/2018, discontinued in April 2020 due to skin toxicity - XRT to the right paratracheal lymph node, 50 Gray in 10 fractions completed on 04/23/2020 - XRT to the intrathoracic lymph node completed on 11/27/2021  - 12/20/2022: Right thoracentesis with 1.8 L removed - 12/21/2022: Left thoracentesis with 700 mL fluid removed, cytology negative. - Unclear etiology of recurrent effusions, exudative. - 2D echocardiogram: Normal LVEF with grade 1 diastolic dysfunction. - CT angiogram 12/20/2022: Negative for PE.  Multifocal parenchymal opacities.  No adenopathy.  2.  Social/family history: - Lives at home with his wife. - Worked for [employer].  Quit smoking 40 years ago. - No family history of malignancies.    Plan: 1.  Metastatic urothelial carcinoma of the bladder to the lungs and bones: - He was recently hospitalized and discharged 2 weeks ago. - He had  bilateral thoracentesis done. - History feels weak.  Cough is better. - He is not moving much per wife. - Auscultation: Right lung base decreased breath sounds.  Otherwise lungs are clear. - We had a prolonged discussion about his current status.  I cannot explain the recurrent pleural effusions and new small lung infiltrates/groundglass opacities compared to scan from last year. - I think it is reasonable to give a trial of restarting treatment with pembrolizumab and see if his overall condition improves.  He was reported to have skin toxicity with pembrolizumab.  Patient does not recollect/remember any serious skin toxicity from pembrolizumab. - I reviewed labs today: Normal LFTs with low albumin.  Creatinine is normal.  CBC grossly normal.  TSH is 6.6. - We will plan to start him on pembrolizumab 200 mg every 3 weeks starting tomorrow. - I will make a referral to physical therapy evaluation and treatment for deconditioning. - RTC 3 weeks for follow-up.   2.  Dry cough: - Likely from lung infiltrates. - Continue Mucinex D.  Tussionex given by hospitalist was completed. - Wife bought over-the-counter Delsym which is helping better.  Continue Delsym.  3.  Loss of appetite/weight loss: - He is drinking 2 cans of boost per day. - We will start him on Megace 400 mg twice daily.    Orders Placed This Encounter  Procedures   Magnesium    Standing Status:   Future    Standing Expiration Date:   01/07/2024   CBC with Differential    Standing Status:   Future    Standing Expiration Date:   01/07/2024   Comprehensive metabolic panel    Standing Status:   Future    Standing  Expiration Date:   01/07/2024   T4    Standing Status:   Future    Standing Expiration Date:   01/07/2024   TSH    Standing Status:   Future    Standing Expiration Date:   01/07/2024   Magnesium    Standing Status:   Future    Standing Expiration Date:   01/28/2024   CBC with Differential    Standing Status:   Future     Standing Expiration Date:   01/28/2024   Comprehensive metabolic panel    Standing Status:   Future    Standing Expiration Date:   01/28/2024   Magnesium    Standing Status:   Future    Standing Expiration Date:   02/18/2024   CBC with Differential    Standing Status:   Future    Standing Expiration Date:   02/18/2024   Comprehensive metabolic panel    Standing Status:   Future    Standing Expiration Date:   02/18/2024   T4    Standing Status:   Future    Standing Expiration Date:   02/18/2024   TSH    Standing Status:   Future    Standing Expiration Date:   02/18/2024   Magnesium    Standing Status:   Future    Standing Expiration Date:   03/10/2024   CBC with Differential    Standing Status:   Future    Standing Expiration Date:   03/10/2024   Comprehensive metabolic panel    Standing Status:   Future    Standing Expiration Date:   03/10/2024   Magnesium    Standing Status:   Future    Standing Expiration Date:   03/31/2024   CBC with Differential    Standing Status:   Future    Standing Expiration Date:   03/31/2024   Comprehensive metabolic panel    Standing Status:   Future    Standing Expiration Date:   03/31/2024   Magnesium    Standing Status:   Future    Standing Expiration Date:   04/21/2024   CBC with Differential    Standing Status:   Future    Standing Expiration Date:   04/21/2024   Comprehensive metabolic panel    Standing Status:   Future    Standing Expiration Date:   04/21/2024   T4    Standing Status:   Future    Standing Expiration Date:   04/21/2024   TSH    Standing Status:   Future    Standing Expiration Date:   04/21/2024   Magnesium    Standing Status:   Future    Standing Expiration Date:   05/12/2024   CBC with Differential    Standing Status:   Future    Standing Expiration Date:   05/12/2024   Comprehensive metabolic panel    Standing Status:   Future    Standing Expiration Date:   05/12/2024      I,Katie Daubenspeck,acting as a scribe for Doreatha Massed, MD.,have documented all relevant documentation on the behalf of Doreatha Massed, MD,as directed by  Doreatha Massed, MD while in the presence of Doreatha Massed, MD.   I, Doreatha Massed MD, have reviewed the above documentation for accuracy and completeness, and I agree with the above.   Doreatha Massed, MD   5/23/202412:49 PM  CHIEF COMPLAINT:   Diagnosis: Metastatic urothelial carcinoma of the bladder    Cancer Staging  No matching  staging information was found for the patient.   Prior Therapy: 1. Neoadjuvant gemcitabine/cisplatin on 05/02/17 (one cycle) 2. Cystoprostatectomy and bilateral lymphadenectomy on 06/29/2017 (Dr. Berneice Heinrich) 3. Radiation therapy to the pelvic lesion 01/02/18 - 01/16/18 4. Carboplatin and gemcitabine, 6 cycles 02/06/18 - 05/22/18 5. Pembrolizumab 06/21/18 - 12/13/18 6. Radiation therapy to RUL and right paratracheal nodes 04/09/20 - 04/23/20 7. Radiation therapy to intrathoracic lymph nodes 11/16/21 - 11/27/21  Current Therapy:  surveillance    HISTORY OF PRESENT ILLNESS:   Oncology History  Stage IV malignant neoplasm of urinary bladder /with mets to the bones and lungs  04/20/2017 Initial Diagnosis   Malignant neoplasm of urinary bladder (HCC)   01/30/2018 - 05/22/2018 Chemotherapy   The patient had palonosetron (ALOXI) injection 0.25 mg, 0.25 mg, Intravenous,  Once, 6 of 6 cycles Administration: 0.25 mg (01/30/2018), 0.25 mg (02/20/2018), 0.25 mg (03/13/2018), 0.25 mg (04/03/2018), 0.25 mg (05/01/2018), 0.25 mg (05/22/2018) CARBOplatin (PARAPLATIN) 400 mg in sodium chloride 0.9 % 250 mL chemo infusion, 400 mg (100 % of original dose 400 mg), Intravenous,  Once, 6 of 6 cycles Dose modification: 400 mg (original dose 400 mg, Cycle 1),   (original dose 400 mg, Cycle 2, Reason: Patient Age), 410 mg (original dose 400 mg, Cycle 6) Administration: 400 mg (01/30/2018), 400 mg (02/20/2018), 400 mg (03/13/2018), 410 mg (04/03/2018), 410 mg (05/01/2018), 410  mg (05/22/2018) gemcitabine (GEMZAR) 2,128 mg in sodium chloride 0.9 % 250 mL chemo infusion, 1,000 mg/m2 = 2,128 mg, Intravenous,  Once, 6 of 6 cycles Dose modification: 800 mg/m2 (original dose 1,000 mg/m2, Cycle 4, Reason: Provider Judgment), 800 mg/m2 (original dose 1,000 mg/m2, Cycle 4, Reason: Provider Judgment) Administration: 2,128 mg (01/30/2018), 2,128 mg (02/06/2018), 2,128 mg (02/20/2018), 2,128 mg (02/27/2018), 2,128 mg (03/13/2018), 1,710 mg (04/03/2018), 1,710 mg (04/10/2018), 1,710 mg (05/01/2018), 1,710 mg (05/22/2018) fosaprepitant (EMEND) 150 mg, dexamethasone (DECADRON) 12 mg in sodium chloride 0.9 % 145 mL IVPB, , Intravenous,  Once, 6 of 6 cycles Administration:  (01/30/2018),  (02/20/2018),  (03/13/2018),  (04/03/2018),  (05/01/2018),  (05/22/2018)  for chemotherapy treatment.    01/07/2023 -  Chemotherapy   Patient is on Treatment Plan : BLADDER Pembrolizumab (200) q21d     Bladder cancer (HCC)  06/28/2017 Initial Diagnosis   Bladder cancer (HCC)   06/21/2018 - 12/13/2018 Chemotherapy   The patient had pembrolizumab (KEYTRUDA) 200 mg in sodium chloride 0.9 % 50 mL chemo infusion, 200 mg, Intravenous, Once, 8 of 9 cycles Administration: 200 mg (06/21/2018), 200 mg (07/12/2018), 200 mg (08/02/2018), 200 mg (08/23/2018), 200 mg (09/13/2018), 200 mg (10/04/2018), 200 mg (11/22/2018), 200 mg (12/13/2018)  for chemotherapy treatment.       INTERVAL HISTORY:   Devry is a 82 y.o. male presenting to clinic today for follow up of Metastatic urothelial carcinoma of the bladder. He was last seen by me on 12/23/22 while he was in the hospital.  Today, he states that he is doing well overall. His appetite level is at 10%. His energy level is at 10%.  PAST MEDICAL HISTORY:   Past Medical History: Past Medical History:  Diagnosis Date   Bladder cancer (HCC)    Bone cancer (HCC)    BPH (benign prostatic hypertrophy)    Dysuria    GERD (gastroesophageal reflux disease)    History of adenomatous polyp  of colon    tubular adenoma 06/ 2018   History of bladder cancer 12/2014   urologist-  dr Sherryl Barters--  dx High Grade TCC without stromal invasion  s/p TURBT and intravesical BCG tx/  recurrent 07/2015 (pTa) TCC  repear intravesical BCG tx    History of hiatal hernia    History of urethral stricture    Nocturia     Surgical History: Past Surgical History:  Procedure Laterality Date   CARDIAC CATHETERIZATION  1997   normal coronary arteries   CARDIOVASCULAR STRESS TEST  12-14-2005   Low risk perfusion study/  very small scar in the inferior septum from mid ventricle to apex,  no ischemia/  mild inferior septal hypokinesis/  ef 58%   CATARACT EXTRACTION W/ INTRAOCULAR LENS  IMPLANT, BILATERAL  2011   COLONOSCOPY  last one 06/ 2018   CYSTOSCOPY WITH BIOPSY N/A 08/12/2015   Procedure: CYSTOSCOPY WITH BIOPSY AND FULGERATION;  Surgeon: Hildred Laser, MD;  Location: [facility];  Service: Urology;  Laterality: N/A;   CYSTOSCOPY WITH INJECTION N/A 06/29/2017   Procedure: CYSTOSCOPY WITH INJECTION OF INDOCYANINE GREEN DYE;  Surgeon: Sebastian Ache, MD;  Location: [facility];  Service: Urology;  Laterality: N/A;   CYSTOSCOPY WITH URETHRAL DILATATION N/A 03/21/2017   Procedure: CYSTOSCOPY;  Surgeon: Hildred Laser, MD;  Location: [facility];  Service: Urology;  Laterality: N/A;   ESOPHAGOGASTRODUODENOSCOPY  12-05-2014   IR IMAGING GUIDED PORT INSERTION  02/07/2018   IR NEPHROSTOMY PLACEMENT LEFT  12/25/2021   IR REMOVAL TUN ACCESS W/ PORT W/O FL MOD SED  04/14/2018   TRANSTHORACIC ECHOCARDIOGRAM  01-31-2008   nomral LVF,  ef 55-65%/  mild pulmonic stenosis without regurg.,  peak transpulomonic valve gradient   TRANSURETHRAL RESECTION OF BLADDER TUMOR N/A 12/31/2014   Procedure: TRANSURETHRAL RESECTION OF BLADDER TUMOR (TURBT);  Surgeon: Su Grand, MD;  Location: [facility];  Service: Urology;  Laterality: N/A;   TRANSURETHRAL RESECTION OF BLADDER  TUMOR N/A 03/21/2017   Procedure: POSSIBLE TRANSURETHRAL RESECTION OF BLADDER TUMOR (TURBT);  Surgeon: Hildred Laser, MD;  Location: [facility];  Service: Urology;  Laterality: N/A;    Social History: Social History   Socioeconomic History   Marital status: Married    Spouse name: Not on file   Number of children: 1   Years of education: Not on file   Highest education level: Not on file  Occupational History   Occupation: Retired  Tobacco Use   Smoking status: Former    Packs/day: 1.00    Years: 34.00    Additional pack years: 0.00    Total pack years: 34.00    Types: Cigarettes    Quit date: 12/26/1988    Years since quitting: 34.0   Smokeless tobacco: Never  Vaping Use   Vaping Use: Never used  Substance and Sexual Activity   Alcohol use: Yes    Alcohol/week: 6.0 standard drinks of alcohol    Types: 6 Cans of beer per week    Comment: BEER   Drug use: No   Sexual activity: Not Currently  Other Topics Concern   Not on file  Social History Narrative   Not on file   Social Determinants of Health   Financial Resource Strain: Not on file  Food Insecurity: No Food Insecurity (12/20/2022)   Hunger Vital Sign    Worried About Running Out of Food in the Last Year: Never true    Ran Out of Food in the Last Year: Never true  Transportation Needs: No Transportation Needs (12/20/2022)   PRAPARE - Administrator, Civil Service (Medical): No  Lack of Transportation (Non-Medical): No  Physical Activity: Not on file  Stress: Not on file  Social Connections: Not on file  Intimate Partner Violence: Not At Risk (12/20/2022)   Humiliation, Afraid, Rape, and Kick questionnaire    Fear of Current or Ex-Partner: No    Emotionally Abused: No    Physically Abused: No    Sexually Abused: No    Family History: Family History  Problem Relation Age of Onset   Alcoholism Father    Colon cancer Neg Hx    Colon polyps Neg Hx    Diabetes Neg Hx    Kidney  disease Neg Hx    Esophageal cancer Neg Hx    Heart disease Neg Hx    Gallbladder disease Neg Hx    Allergic rhinitis Neg Hx    Angioedema Neg Hx    Asthma Neg Hx    Atopy Neg Hx    Eczema Neg Hx    Immunodeficiency Neg Hx    Urticaria Neg Hx     Current Medications:  Current Outpatient Medications:    acetaminophen (TYLENOL) 500 MG tablet, Take 1,000 mg by mouth every 6 (six) hours as needed for mild pain, fever, moderate pain or headache., Disp: , Rfl:    apixaban (ELIQUIS) 5 MG TABS tablet, Take 1 tablet (5 mg total) by mouth 2 (two) times daily., Disp: 60 tablet, Rfl: 2   chlorpheniramine-HYDROcodone (TUSSIONEX) 10-8 MG/5ML, Take 5 mLs by mouth every 12 (twelve) hours as needed (severe cough)., Disp: 70 mL, Rfl: 0   dextromethorphan-guaiFENesin (MUCINEX DM) 30-600 MG 12hr tablet, Take 1 tablet by mouth 2 (two) times daily., Disp: , Rfl:    megestrol (MEGACE) 400 MG/10ML suspension, Take 10 mLs (400 mg total) by mouth 2 (two) times daily., Disp: 480 mL, Rfl: 3   methenamine (HIPREX) 1 g tablet, Take 1 g by mouth 2 (two) times daily., Disp: , Rfl:    metoprolol tartrate (LOPRESSOR) 25 MG tablet, Take 1 tablet (25 mg total) by mouth 2 (two) times daily., Disp: 60 tablet, Rfl: 2   pantoprazole (PROTONIX) 40 MG tablet, Take 1 tablet (40 mg total) by mouth 2 (two) times daily., Disp: 60 tablet, Rfl: 0  Current Facility-Administered Medications:    omalizumab Geoffry Paradise) prefilled syringe 300 mg, 300 mg, Subcutaneous, Q14 Days, Alfonse Spruce, MD, 300 mg at 11/24/22 6045   Allergies: Allergies  Allergen Reactions   Ciprofloxacin Hives   Zofran [Ondansetron] Rash    REVIEW OF SYSTEMS:   Review of Systems  Constitutional:  Negative for chills, fatigue and fever.  HENT:   Negative for lump/mass, mouth sores, nosebleeds, sore throat and trouble swallowing.   Eyes:  Negative for eye problems.  Respiratory:  Positive for cough. Negative for shortness of breath.   Cardiovascular:   Negative for chest pain, leg swelling and palpitations.  Gastrointestinal:  Negative for abdominal pain, constipation, diarrhea, nausea and vomiting.  Genitourinary:  Negative for bladder incontinence, difficulty urinating, dysuria, frequency, hematuria and nocturia.   Musculoskeletal:  Negative for arthralgias, back pain, flank pain, myalgias and neck pain.  Skin:  Negative for itching and rash.  Neurological:  Negative for dizziness, headaches and numbness.  Hematological:  Does not bruise/bleed easily.  Psychiatric/Behavioral:  Negative for depression, sleep disturbance and suicidal ideas. The patient is not nervous/anxious.   All other systems reviewed and are negative.    VITALS:   Blood pressure 114/84, pulse 86, temperature (!) 96.6 F (35.9 C), temperature source Tympanic, resp.  rate 16, weight 172 lb 6.4 oz (78.2 kg), SpO2 94 %.  Wt Readings from Last 3 Encounters:  01/06/23 172 lb 6.4 oz (78.2 kg)  12/20/22 173 lb (78.5 kg)  12/10/22 182 lb (82.6 kg)    Body mass index is 24.04 kg/m.  Performance status (ECOG): 1 - Symptomatic but completely ambulatory  PHYSICAL EXAM:   Physical Exam Vitals and nursing note reviewed. Exam conducted with a chaperone present.  Constitutional:      Appearance: Normal appearance.  Cardiovascular:     Rate and Rhythm: Normal rate and regular rhythm.     Pulses: Normal pulses.     Heart sounds: Normal heart sounds.  Pulmonary:     Effort: Pulmonary effort is normal.     Breath sounds: Normal breath sounds.  Abdominal:     Palpations: Abdomen is soft. There is no hepatomegaly, splenomegaly or mass.     Tenderness: There is no abdominal tenderness.  Musculoskeletal:     Right lower leg: No edema.     Left lower leg: No edema.  Lymphadenopathy:     Cervical: No cervical adenopathy.     Right cervical: No superficial, deep or posterior cervical adenopathy.    Left cervical: No superficial, deep or posterior cervical adenopathy.      Upper Body:     Right upper body: No supraclavicular or axillary adenopathy.     Left upper body: No supraclavicular or axillary adenopathy.  Neurological:     General: No focal deficit present.     Mental Status: He is alert and oriented to person, place, and time.  Psychiatric:        Mood and Affect: Mood normal.        Behavior: Behavior normal.     LABS:      Latest Ref Rng & Units 01/06/2023   10:13 AM 12/22/2022    1:55 AM 12/21/2022    4:09 AM  CBC  WBC 4.0 - 10.5 K/uL 7.1  5.6  6.3   Hemoglobin 13.0 - 17.0 g/dL 01.0  93.2  35.5   Hematocrit 39.0 - 52.0 % 46.2  37.3  38.0   Platelets 150 - 400 K/uL 264  228  239       Latest Ref Rng & Units 01/06/2023   10:13 AM 12/23/2022    4:12 AM 12/22/2022    1:55 AM  CMP  Glucose 70 - 99 mg/dL 732  202  542   BUN 8 - 23 mg/dL 15  11  13    Creatinine 0.61 - 1.24 mg/dL 7.06  2.37  6.28   Sodium 135 - 145 mmol/L 139  135  135   Potassium 3.5 - 5.1 mmol/L 3.9  4.0  3.9   Chloride 98 - 111 mmol/L 104  102  101   CO2 22 - 32 mmol/L 24  27  27    Calcium 8.9 - 10.3 mg/dL 8.9  8.5  8.3   Total Protein 6.5 - 8.1 g/dL 6.0     Total Bilirubin 0.3 - 1.2 mg/dL 0.6     Alkaline Phos 38 - 126 U/L 61     AST 15 - 41 U/L 22     ALT 0 - 44 U/L 20        No results found for: "CEA1", "CEA" / No results found for: "CEA1", "CEA" No results found for: "PSA1" No results found for: "BTD176" No results found for: "CAN125"  No results found for: "TOTALPROTELP", "ALBUMINELP", "A1GS", "  A2GS", "BETS", "BETA2SER", "GAMS", "MSPIKE", "SPEI" Lab Results  Component Value Date   TIBC 249 03/20/2018   FERRITIN 759 (H) 03/20/2018   IRONPCTSAT 20 (L) 03/20/2018   Lab Results  Component Value Date   LDH 96 (L) 06/21/2022     STUDIES:   ECHOCARDIOGRAM COMPLETE  Result Date: 12/23/2022    ECHOCARDIOGRAM REPORT   Patient Name:   [name] Date of Exam: 12/23/2022 Medical Rec #:  [id number]      Height:       71.0 in Accession #:    [id number]      Weight:       173.0 lb Date of Birth:  [birth year]      BSA:          1.983 m Patient Age:    81 years       BP:           111/70 mmHg Patient Gender: M              HR:           104 bpm. [facility] Procedure: 2D Echo, Cardiac Doppler, Color Doppler and Intracardiac            Opacification Agent Indications:    Congestive Heart Failure I50.9  History:        Patient has prior history of Echocardiogram examinations, most                 recent 06/22/2022. CAP, Signs/Symptoms:Shortness of Breath; Risk                 Factors:Former Smoker.                 Stage IV malignant neoplasm of urinary bladder /with mets to the                 bones and lungs.  Sonographer:    Aron Baba Referring Phys: [id number]  Sonographer Comments: Technically difficult study due to poor echo windows. Image acquisition challenging due to patient body habitus and Image acquisition challenging due to COPD. IMPRESSIONS  1. Left ventricular ejection fraction, by estimation, is 60 to 65%. The left ventricle has normal function. The left ventricle has no regional wall motion abnormalities. Left ventricular diastolic parameters are consistent with Grade I diastolic dysfunction (impaired relaxation).  2. Right ventricular systolic function is normal. The right ventricular size is mildly enlarged. Tricuspid regurgitation signal is inadequate for assessing PA pressure.  3. The mitral valve is grossly normal. Trivial mitral valve regurgitation.  4. The aortic valve was not well visualized. There is moderate calcification of the aortic valve. Aortic valve regurgitation is not visualized.  5. The inferior vena cava is normal in size with greater than 50% respiratory variability, suggesting right atrial pressure of 3 mmHg. Comparison(s): Prior images reviewed side by side. Images are very limited. LVEF remains normal range at 60-65%. FINDINGS  Left Ventricle: Left ventricular ejection fraction, by estimation, is 60 to 65%. The  left ventricle has normal function. The left ventricle has no regional wall motion abnormalities. Definity contrast agent was given IV to delineate the left ventricular  endocardial borders. The left ventricular internal cavity size was normal in size. There is no left ventricular hypertrophy. Left ventricular diastolic parameters are consistent with Grade I diastolic dysfunction (impaired relaxation). Right Ventricle: The right ventricular size is mildly enlarged. No increase in right ventricular wall thickness. Right ventricular systolic  function is normal. Tricuspid regurgitation signal is inadequate for assessing PA pressure. Left Atrium: Left atrial size was normal in size. Right Atrium: Right atrial size was normal in size. Pericardium: There is no evidence of pericardial effusion. Presence of epicardial fat layer. Mitral Valve: The mitral valve is grossly normal. Trivial mitral valve regurgitation. Tricuspid Valve: The tricuspid valve is not well visualized. Tricuspid valve regurgitation is trivial. Aortic Valve: The aortic valve was not well visualized. There is moderate calcification of the aortic valve. Aortic valve regurgitation is not visualized. Pulmonic Valve: The pulmonic valve was not well visualized. Pulmonic valve regurgitation is trivial. Aorta: The aortic root is normal in size and structure. Venous: The inferior vena cava is normal in size with greater than 50% respiratory variability, suggesting right atrial pressure of 3 mmHg. IAS/Shunts: No atrial level shunt detected by color flow Doppler.  LEFT VENTRICLE PLAX 2D LVOT diam:     2.10 cm     Diastology LV SV:         56          LV e' medial:    6.49 cm/s LV SV Index:   28          LV E/e' medial:  10.2 LVOT Area:     3.46 cm    LV e' lateral:   7.38 cm/s                            LV E/e' lateral: 9.0  LV Volumes (MOD) LV vol d, MOD A2C: 63.1 ml LV vol d, MOD A4C: 84.2 ml LV vol s, MOD A2C: 14.0 ml LV vol s, MOD A4C: 30.2 ml LV SV MOD A2C:      49.1 ml LV SV MOD A4C:     84.2 ml LV SV MOD BP:      53.9 ml RIGHT VENTRICLE RV S prime:     11.10 cm/s TAPSE (M-mode): 1.1 cm LEFT ATRIUM           Index        RIGHT ATRIUM           Index LA diam:      2.00 cm 1.01 cm/m   RA Area:     12.50 cm LA Vol (A2C): 13.1 ml 6.61 ml/m   RA Volume:   29.60 ml  14.93 ml/m LA Vol (A4C): 28.1 ml 14.17 ml/m  AORTIC VALVE LVOT Vmax:   90.70 cm/s LVOT Vmean:  67.500 cm/s LVOT VTI:    0.161 m  AORTA Ao Root diam: 3.60 cm MITRAL VALVE MV Area (PHT): 4.49 cm    SHUNTS MV Decel Time: 169 msec    Systemic VTI:  0.16 m MV E velocity: 66.40 cm/s  Systemic Diam: 2.10 cm MV A velocity: 86.10 cm/s MV E/A ratio:  0.77 Nona Dell MD Electronically signed by Nona Dell MD Signature Date/Time: 12/23/2022/10:33:41 AM    Final    DG Chest Port 1 View  Result Date: 12/21/2022 CLINICAL DATA:  Shortness of breath. EXAM: PORTABLE CHEST 1 VIEW COMPARISON:  Chest radiograph dated 12/21/2022. FINDINGS: Small bilateral pleural effusions with bibasilar atelectasis or infiltrate. No pneumothorax. Stable cardiac silhouette. No acute osseous pathology. IMPRESSION: Small bilateral pleural effusions with bibasilar atelectasis or infiltrate. Electronically Signed   By: Elgie Collard M.D.   On: 12/21/2022 23:54   US THORACENTESIS ASP PLEURAL SPACE W/IMG GUIDE  Result Date: 12/21/2022 INDICATION: Patient with a history  of metastatic bladder cancer with pleural effusions. Interventional Radiology asked to perform a diagnostic and therapeutic left thoracentesis. Patient is s/p Right thoracentesis 12/20/22. EXAM: ULTRASOUND GUIDED THORACENTESIS MEDICATIONS: 1% lidocaine 10 ml COMPLICATIONS: None immediate. PROCEDURE: An ultrasound guided thoracentesis was thoroughly discussed with the patient and questions answered. The benefits, risks, alternatives and complications were also discussed. The patient understands and wishes to proceed with the procedure. Written consent was obtained.  Ultrasound was performed to localize and mark an adequate pocket of fluid in the left chest. The area was then prepped and draped in the normal sterile fashion. 1% Lidocaine was used for local anesthesia. Under ultrasound guidance a 6 Fr Safe-T-Centesis catheter was introduced. Thoracentesis was performed. The catheter was removed and a dressing applied. FINDINGS: A total of approximately 700 ml of clear yellow fluid was removed. Samples were sent to the laboratory as requested by the clinical team. IMPRESSION: Successful ultrasound guided left thoracentesis yielding 700 ml of pleural fluid. Read by: Alwyn Ren, NP Electronically Signed   By: Ulyses Southward M.D.   On: 12/21/2022 11:38   DG Chest 1 View  Result Date: 12/21/2022 CLINICAL DATA:  Post thoracentesis EXAM: CHEST  1 VIEW COMPARISON:  12/20/2022 FINDINGS: Normal heart size and mediastinal contours. Tiny residual bibasilar effusions. Accentuated LEFT perihilar markings stable. No acute infiltrate or pneumothorax. IMPRESSION: No pneumothorax following thoracentesis. Electronically Signed   By: Ulyses Southward M.D.   On: 12/21/2022 10:49   CT Angio Chest PE W and/or Wo Contrast  Result Date: 12/20/2022 CLINICAL DATA:  Productive cough and worsening shortness of breath with worsening weakness. Status post thoracentesis EXAM: CT ANGIOGRAPHY CHEST WITH CONTRAST TECHNIQUE: Multidetector CT imaging of the chest was performed using the standard protocol during bolus administration of intravenous contrast. Multiplanar CT image reconstructions and MIPs were obtained to evaluate the vascular anatomy. RADIATION DOSE REDUCTION: This exam was performed according to the departmental dose-optimization program which includes automated exposure control, adjustment of the mA and/or kV according to patient size and/or use of iterative reconstruction technique. CONTRAST:  75mL OMNIPAQUE IOHEXOL 350 MG/ML SOLN COMPARISON:  X-ray earlier 12/20/2022. Chest CT with contrast  12/16/2022. Older exams as well. FINDINGS: Cardiovascular: Heart is nonenlarged. Trace pericardial fluid. Normal caliber thoracic aorta with mild vascular calcifications. Coronary artery calcifications are seen. There is significant motion throughout the examination. This is nondiagnostic for small and peripheral emboli. No segmental or larger pulmonary embolism identified at this time. Mediastinum/Nodes: Small hiatal hernia. Normal caliber thoracic esophagus. Small thyroid gland. No specific abnormal lymph node enlargement seen in the axillary regions, hilum or mediastinum. Lungs/Pleura: Moderate left and small right pleural effusion identified with the adjacent lung opacities. The right effusion is significantly improved status post thoracentesis. Patchy ground-glass once again seen in the left upper lobe medial and perihilar. Similar distribution but slightly more confluence today. Associated areas of bronchial wall thickening in this location. There is also a left upper lobe anterior spiculated nodule which is stable. Series 5, image 75 this measures 11 x 7 mm. There is patchy parenchymal opacities in the right upper lung extending to the apex and medially along the right hemithorax extending down to the hilum with narrowing of bronchi. Associated bronchial wall thickening. These areas are similar when adjusting for technique and with the decreased level of the effusion today. There is also some bronchial wall thickening in the right lower lobe but improved aeration to the right lower lobe of the lung status post thoracentesis. Upper Abdomen: The adrenal  glands are incompletely included in the imaging field. Benign hepatic cysts. Musculoskeletal: Gynecomastia. Scattered degenerative changes along the spine. Old right-sided rib fractures. Review of the MIP images confirms the above findings. IMPRESSION: Improved right pleural effusion with a small residual status post thoracentesis. No pneumothorax. Persistent  left-sided pleural effusion. Multifocal areas of parenchymal opacity are again noted. Areas in the upper lung zones has a similar distribution. Focus on the left is slightly more confluence, perihilar anteriorly. Separate spiculated 11 mm left upper lobe lung nodule. Recommend continued follow up based on prior workup. Significant breathing motion. No segmental or larger pulmonary embolism. Aortic Atherosclerosis (ICD10-I70.0) and Emphysema (ICD10-J43.9). Electronically Signed   By: Karen Kays M.D.   On: 12/20/2022 18:18   US THORACENTESIS ASP PLEURAL SPACE W/IMG GUIDE  Result Date: 12/20/2022 INDICATION: 82 year old male with history of metastatic bladder cancer. EXAM: ULTRASOUND GUIDED RIGHT THORACENTESIS MEDICATIONS: 10 mL 1% lidocaine COMPLICATIONS: None immediate. PROCEDURE: An ultrasound guided thoracentesis was thoroughly discussed with the patient and questions answered. The benefits, risks, alternatives and complications were also discussed. The patient understands and wishes to proceed with the procedure. Written consent was obtained. Ultrasound was performed to localize and mark an adequate pocket of fluid in the right chest. The area was then prepped and draped in the normal sterile fashion. 1% Lidocaine was used for local anesthesia. Under ultrasound guidance a 6 Fr Safe-T-Centesis catheter was introduced. Thoracentesis was performed. The catheter was removed and a dressing applied. FINDINGS: A total of approximately 1.8 liters of dark yellow, clear fluid was removed. IMPRESSION: Successful ultrasound guided right thoracentesis yielding 1.8 liters of pleural fluid. Read by: Loyce Dys PA-C Electronically Signed   By: Ulyses Southward M.D.   On: 12/20/2022 10:58   DG Chest 1 View  Result Date: 12/20/2022 CLINICAL DATA:  Post thoracentesis EXAM: CHEST  1 VIEW COMPARISON:  11/29/2022 FINDINGS: Normal heart size, mediastinal contours, and pulmonary vascularity. Small LEFT pleural effusion and minimal  LEFT basilar atelectasis. Lungs otherwise clear. No acute infiltrate or pneumothorax. Bones demineralized. IMPRESSION: No pneumothorax following thoracentesis. Electronically Signed   By: Ulyses Southward M.D.   On: 12/20/2022 10:55   CT Chest W Contrast  Result Date: 12/16/2022 CLINICAL DATA:  Staging of bladder cancer.  Lung infiltrates. * Tracking Code: BO * EXAM: CT CHEST WITH CONTRAST TECHNIQUE: Multidetector CT imaging of the chest was performed during intravenous contrast administration. RADIATION DOSE REDUCTION: This exam was performed according to the departmental dose-optimization program which includes automated exposure control, adjustment of the mA and/or kV according to patient size and/or use of iterative reconstruction technique. CONTRAST:  75mL OMNIPAQUE IOHEXOL 300 MG/ML  SOLN COMPARISON:  PET-CT 11/25/2022 FINDINGS: Cardiovascular: Coronary, aortic arch, and branch vessel atherosclerotic vascular disease. Mediastinum/Nodes: Unremarkable Lungs/Pleura: Large bilateral pleural effusions. Atelectasis medially in the right upper lobe and right lung apex. A left upper lobe nodule measures 1.0 by 0.7 cm on image 37 series 4, previously 1.2 by 0.6 cm on 06/21/2022. This was not appreciably hypermetabolic on 11/25/2022. 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 Upper Abdomen: Abdominal aortic atherosclerosis. Otherwise no significant findings. Musculoskeletal: Old healed right lateral rib fractures. Bilateral gynecomastia. IMPRESSION: 1. Large bilateral pleural effusions. 2. 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 3. Stable 1.0 by 0.7 cm left upper lobe nodule. This has not changed substantially over the last 4 years. This nodule was not appreciably  hypermetabolic on 11/25/2022. 4. Bilateral gynecomastia. 5. Aortic arch and branch vessel atherosclerotic  vascular disease. 6. Old healed right lateral rib fractures. 7. Atelectasis medially in the right upper lobe and right lung apex. Aortic Atherosclerosis (ICD10-I70.0). Electronically Signed   By: Gaylyn Rong M.D.   On: 12/16/2022 18:02

## 2023-01-06 ENCOUNTER — Inpatient Hospital Stay: Payer: Medicare Other | Attending: Oncology | Admitting: Hematology

## 2023-01-06 ENCOUNTER — Inpatient Hospital Stay: Payer: Medicare Other

## 2023-01-06 ENCOUNTER — Ambulatory Visit (HOSPITAL_COMMUNITY): Admission: RE | Admit: 2023-01-06 | Payer: Medicare Other | Source: Ambulatory Visit

## 2023-01-06 ENCOUNTER — Telehealth: Payer: Self-pay | Admitting: Pulmonary Disease

## 2023-01-06 ENCOUNTER — Encounter: Payer: Self-pay | Admitting: Hematology

## 2023-01-06 VITALS — BP 114/84 | HR 86 | Temp 96.6°F | Resp 16 | Wt 172.4 lb

## 2023-01-06 DIAGNOSIS — C679 Malignant neoplasm of bladder, unspecified: Secondary | ICD-10-CM

## 2023-01-06 DIAGNOSIS — C7951 Secondary malignant neoplasm of bone: Secondary | ICD-10-CM | POA: Insufficient documentation

## 2023-01-06 DIAGNOSIS — C7801 Secondary malignant neoplasm of right lung: Secondary | ICD-10-CM | POA: Diagnosis not present

## 2023-01-06 DIAGNOSIS — Z5112 Encounter for antineoplastic immunotherapy: Secondary | ICD-10-CM | POA: Insufficient documentation

## 2023-01-06 DIAGNOSIS — C7802 Secondary malignant neoplasm of left lung: Secondary | ICD-10-CM | POA: Insufficient documentation

## 2023-01-06 DIAGNOSIS — Z87891 Personal history of nicotine dependence: Secondary | ICD-10-CM | POA: Diagnosis not present

## 2023-01-06 DIAGNOSIS — D539 Nutritional anemia, unspecified: Secondary | ICD-10-CM

## 2023-01-06 DIAGNOSIS — Z79620 Long term (current) use of immunosuppressive biologic: Secondary | ICD-10-CM | POA: Diagnosis not present

## 2023-01-06 DIAGNOSIS — C771 Secondary and unspecified malignant neoplasm of intrathoracic lymph nodes: Secondary | ICD-10-CM

## 2023-01-06 LAB — COMPREHENSIVE METABOLIC PANEL
ALT: 20 U/L (ref 0–44)
AST: 22 U/L (ref 15–41)
Albumin: 2.8 g/dL — ABNORMAL LOW (ref 3.5–5.0)
Alkaline Phosphatase: 61 U/L (ref 38–126)
Anion gap: 11 (ref 5–15)
BUN: 15 mg/dL (ref 8–23)
CO2: 24 mmol/L (ref 22–32)
Calcium: 8.9 mg/dL (ref 8.9–10.3)
Chloride: 104 mmol/L (ref 98–111)
Creatinine, Ser: 1.16 mg/dL (ref 0.61–1.24)
GFR, Estimated: >60 mL/min (ref >60)
Glucose, Bld: 126 mg/dL — ABNORMAL HIGH (ref 70–99)
Potassium: 3.9 mmol/L (ref 3.5–5.1)
Sodium: 139 mmol/L (ref 135–145)
Total Bilirubin: 0.6 mg/dL (ref 0.3–1.2)
Total Protein: 6 g/dL — ABNORMAL LOW (ref 6.5–8.1)

## 2023-01-06 LAB — CBC WITH DIFFERENTIAL/PLATELET
Abs Immature Granulocytes: 0.04 10*3/uL (ref 0.00–0.07)
Basophils Absolute: 0.1 10*3/uL (ref 0.0–0.1)
Basophils Relative: 1 %
Eosinophils Absolute: 0.2 10*3/uL (ref 0.0–0.5)
Eosinophils Relative: 3 %
HCT: 46.2 % (ref 39.0–52.0)
Hemoglobin: 15 g/dL (ref 13.0–17.0)
Immature Granulocytes: 1 %
Lymphocytes Relative: 7 %
Lymphs Abs: 0.5 10*3/uL — ABNORMAL LOW (ref 0.7–4.0)
MCH: 35.5 pg — ABNORMAL HIGH (ref 26.0–34.0)
MCHC: 32.5 g/dL (ref 30.0–36.0)
MCV: 109.5 fL — ABNORMAL HIGH (ref 80.0–100.0)
Monocytes Absolute: 0.8 10*3/uL (ref 0.1–1.0)
Monocytes Relative: 11 %
Neutro Abs: 5.5 10*3/uL (ref 1.7–7.7)
Neutrophils Relative %: 77 %
Platelets: 264 10*3/uL (ref 150–400)
RBC: 4.22 MIL/uL (ref 4.22–5.81)
RDW: 13.5 % (ref 11.5–15.5)
WBC: 7.1 10*3/uL (ref 4.0–10.5)
nRBC: 0 % (ref 0.0–0.2)

## 2023-01-06 LAB — TSH: TSH: 6.645 u[IU]/mL — ABNORMAL HIGH (ref 0.350–4.500)

## 2023-01-06 LAB — MAGNESIUM: Magnesium: 2.2 mg/dL (ref 1.7–2.4)

## 2023-01-06 MED ORDER — MEGESTROL ACETATE 400 MG/10ML PO SUSP: 400.0000 mg | Freq: Two times a day (BID) | ORAL | 3 refills | Status: DC

## 2023-01-06 NOTE — Progress Notes (Signed)
ON PATHWAY REGIMEN - Bladder  No Change  Continue With Treatment as Ordered.  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

## 2023-01-06 NOTE — Telephone Encounter (Signed)
[name] from [facility] [place] calling .  PT had a Echo recently (12/23/22 ?) and wonders if Dr. Val Eagle still wants one.  Please call to advise @ [phone] or Internal Message [place]

## 2023-01-06 NOTE — Telephone Encounter (Signed)
Secure chat between Dr. Wynona Neat and Cyndra Numbers performed. Per Dr. Val Eagle, since pt had echo 5/9, the echo from today 5/23 is not needed so it has been cancelled.   Called and spoke with pt's spouse letting her know this was done and she verbalized understanding. Nothing further needed.

## 2023-01-06 NOTE — Patient Instructions (Addendum)
[facility] at [facility] Discharge Instructions   You were seen and examined today by Dr. Ellin Saba.  He reviewed the results of your lab work from yesterday which are normal/stable.   We will plan to restart you on treatment with Keytruda. We have you scheduled for 1030 tomorrow. This will be given every 3 weeks.   We are going to put you on an appetite stimulant called Megace. This is a liquid that you take twice a day.   We will refer you to physical therapy. They will see you and work with you to get your strength built back up.   Return as scheduled.    Thank you for choosing [facility] at [facility] to provide your oncology and hematology care.  To afford each patient quality time with our provider, please arrive at least 15 minutes before your scheduled appointment time.   If you have a lab appointment with the Cancer Center please come in thru the Main Entrance and check in at the main information desk.  You need to re-schedule your appointment should you arrive 10 or more minutes late.  We strive to give you quality time with our providers, and arriving late affects you and other patients whose appointments are after yours.  Also, if you no show three or more times for appointments you may be dismissed from the clinic at the providers discretion.     Again, thank you for choosing [facility].  Our hope is that these requests will decrease the amount of time that you wait before being seen by our physicians.       _____________________________________________________________  Should you have questions after your visit to [facility], please contact our office at [phone] and follow the prompts.  Our office hours are 8:00 a.m. and 4:30 p.m. Monday - Friday.  Please note that voicemails left after 4:00 p.m. may not be returned until the following business day.  We are closed weekends and major holidays.   You do have access to a nurse 24-7, just call the main number to the clinic [phone] and do not press any options, hold on the line and a nurse will answer the phone.    For prescription refill requests, have your pharmacy contact our office and allow 72 hours.    Due to Covid, you will need to wear a mask upon entering the hospital. If you do not have a mask, a mask will be given to you at the Main Entrance upon arrival. For doctor visits, patients may have 1 support person age 82 or older with them. For treatment visits, patients can not have anyone with them due to social distancing guidelines and our immunocompromised population.

## 2023-01-06 NOTE — Progress Notes (Signed)
START ON PATHWAY REGIMEN - Bladder     A cycle is every 42 days:     Pembrolizumab   **Always confirm dose/schedule in your pharmacy ordering system**  Patient Characteristics: Advanced/Metastatic Disease, Second Line, FGFR3 Alteration Absent or Unknown, Prior Platinum-Based Therapy and No Prior PD-1/PD-L1 Inhibitor Therapeutic Status: Advanced/Metastatic Disease Line of Therapy: Second Line FGFR3 Alteration Status: Quantity Not Sufficient Intent of Therapy: Non-Curative / Palliative Intent, Discussed with Patient

## 2023-01-06 NOTE — Progress Notes (Signed)
Pharmacist Chemotherapy Monitoring - Initial Assessment    Anticipated start date: 01/07/23   The following has been reviewed per standard work regarding the patient's treatment regimen: The patient's diagnosis, treatment plan and drug doses, and organ/hematologic function Lab orders and baseline tests specific to treatment regimen  The treatment plan start date, drug sequencing, and pre-medications Prior authorization status  Patient's documented medication list, including drug-drug interaction screen and prescriptions for anti-emetics and supportive care specific to the treatment regimen The drug concentrations, fluid compatibility, administration routes, and timing of the medications to be used The patient's access for treatment and lifetime cumulative dose history, if applicable  The patient's medication allergies and previous infusion related reactions, if applicable   Changes made to treatment plan:  N/A  Follow up needed:  N/A   Stephens Shire, [facility], 01/06/2023  2:29 PM

## 2023-01-07 ENCOUNTER — Inpatient Hospital Stay: Payer: Medicare Other

## 2023-01-07 ENCOUNTER — Other Ambulatory Visit: Payer: Self-pay

## 2023-01-07 VITALS — BP 102/62 | HR 69 | Temp 98.6°F | Resp 16

## 2023-01-07 DIAGNOSIS — C679 Malignant neoplasm of bladder, unspecified: Secondary | ICD-10-CM | POA: Diagnosis not present

## 2023-01-07 DIAGNOSIS — C7802 Secondary malignant neoplasm of left lung: Secondary | ICD-10-CM | POA: Diagnosis not present

## 2023-01-07 DIAGNOSIS — Z79620 Long term (current) use of immunosuppressive biologic: Secondary | ICD-10-CM | POA: Diagnosis not present

## 2023-01-07 DIAGNOSIS — C7951 Secondary malignant neoplasm of bone: Secondary | ICD-10-CM | POA: Diagnosis not present

## 2023-01-07 DIAGNOSIS — C7801 Secondary malignant neoplasm of right lung: Secondary | ICD-10-CM | POA: Diagnosis not present

## 2023-01-07 DIAGNOSIS — Z5112 Encounter for antineoplastic immunotherapy: Secondary | ICD-10-CM | POA: Diagnosis not present

## 2023-01-07 MED ORDER — SODIUM CHLORIDE 0.9 % IV SOLN: Freq: Once | INTRAVENOUS | Status: AC

## 2023-01-07 MED ORDER — SODIUM CHLORIDE 0.9 % IV SOLN
200.0000 mg | Freq: Once | INTRAVENOUS | Status: AC
  Administered 2023-01-07: 200 mg via INTRAVENOUS
  Filled 2023-01-07: qty 8

## 2023-01-07 MED ORDER — HEPARIN SOD (PORK) LOCK FLUSH 100 UNIT/ML IV SOLN: 500.0000 [IU] | Freq: Once | INTRAVENOUS | Status: DC | PRN

## 2023-01-07 MED ORDER — SODIUM CHLORIDE 0.9% FLUSH: 10.0000 mL | INTRAVENOUS | Status: DC | PRN

## 2023-01-07 NOTE — Progress Notes (Signed)
Patient presents today for C1D1 Keytruda infusion. Patient is in satisfactory condition with no new complaints voiced.  Vital signs are stable.  Labs reviewed by Dr.Katragadda on 01/06/23 and all labs are within treatment parameters.  We will proceed with treatment per MD orders.    Treatment given today per MD orders. Tolerated infusion without adverse affects. Vital signs stable. No complaints at this time. Discharged from clinic via wheelchair in stable condition. Alert and oriented x 3. F/U with [facility] as scheduled.

## 2023-01-07 NOTE — Patient Instructions (Signed)
[facility]  Discharge Instructions: Thank you for choosing [facility] to provide your oncology and hematology care.  If you have a lab appointment with the Cancer Center - please note that after April 8th, 2024, all labs will be drawn in the cancer center.  You do not have to check in or register with the main entrance as you have in the past but will complete your check-in in the cancer center.  Wear comfortable clothing and clothing appropriate for easy access to any Portacath or PICC line.   We strive to give you quality time with your provider. You may need to reschedule your appointment if you arrive late (15 or more minutes).  Arriving late affects you and other patients whose appointments are after yours.  Also, if you miss three or more appointments without notifying the office, you may be dismissed from the clinic at the provider's discretion.      For prescription refill requests, have your pharmacy contact our office and allow 72 hours for refills to be completed.    Today you received the following chemotherapy and/or immunotherapy agents Keytruda   To help prevent nausea and vomiting after your treatment, we encourage you to take your nausea medication as directed.  Pembrolizumab Injection What is this medication? PEMBROLIZUMAB (PEM broe LIZ ue mab) treats some types of cancer. It works by helping your immune system slow or stop the spread of cancer cells. It is a monoclonal antibody. This medicine may be used for other purposes; ask your health care provider or pharmacist if you have questions. COMMON BRAND NAME(S): Keytruda What should I tell my care team before I take this medication? They need to know if you have any of these conditions: Allogeneic stem cell transplant (uses someone else's stem cells) Autoimmune diseases, such as Crohn disease, ulcerative colitis, lupus History of chest radiation Nervous system problems, such as  Guillain-Barre syndrome, myasthenia gravis Organ transplant An unusual or allergic reaction to pembrolizumab, other medications, foods, dyes, or preservatives Pregnant or trying to get pregnant Breast-feeding How should I use this medication? This medication is injected into a vein. It is given by your care team in a hospital or clinic setting. A special MedGuide will be given to you before each treatment. Be sure to read this information carefully each time. Talk to your care team about the use of this medication in children. While it may be prescribed for children as young as 6 months for selected conditions, precautions do apply. Overdosage: If you think you have taken too much of this medicine contact a poison control center or emergency room at once. NOTE: This medicine is only for you. Do not share this medicine with others. What if I miss a dose? Keep appointments for follow-up doses. It is important not to miss your dose. Call your care team if you are unable to keep an appointment. What may interact with this medication? Interactions have not been studied. This list may not describe all possible interactions. Give your health care provider a list of all the medicines, herbs, non-prescription drugs, or dietary supplements you use. Also tell them if you smoke, drink alcohol, or use illegal drugs. Some items may interact with your medicine. What should I watch for while using this medication? Your condition will be monitored carefully while you are receiving this medication. You may need blood work while taking this medication. This medication may cause serious skin reactions. They can happen weeks to months  after starting the medication. Contact your care team right away if you notice fevers or flu-like symptoms with a rash. The rash may be red or purple and then turn into blisters or peeling of the skin. You may also notice a red rash with swelling of the face, lips, or lymph nodes in your  neck or under your arms. Tell your care team right away if you have any change in your eyesight. Talk to your care team if you may be pregnant. Serious birth defects can occur if you take this medication during pregnancy and for 4 months after the last dose. You will need a negative pregnancy test before starting this medication. Contraception is recommended while taking this medication and for 4 months after the last dose. Your care team can help you find the option that works for you. Do not breastfeed while taking this medication and for 4 months after the last dose. What side effects may I notice from receiving this medication? Side effects that you should report to your care team as soon as possible: Allergic reactions--skin rash, itching, hives, swelling of the face, lips, tongue, or throat Dry cough, shortness of breath or trouble breathing Eye pain, redness, irritation, or discharge with blurry or decreased vision Heart muscle inflammation--unusual weakness or fatigue, shortness of breath, chest pain, fast or irregular heartbeat, dizziness, swelling of the ankles, feet, or hands Hormone gland problems--headache, sensitivity to light, unusual weakness or fatigue, dizziness, fast or irregular heartbeat, increased sensitivity to cold or heat, excessive sweating, constipation, hair loss, increased thirst or amount of urine, tremors or shaking, irritability Infusion reactions--chest pain, shortness of breath or trouble breathing, feeling faint or lightheaded Kidney injury (glomerulonephritis)--decrease in the amount of urine, red or dark brown urine, foamy or bubbly urine, swelling of the ankles, hands, or feet Liver injury--right upper belly pain, loss of appetite, nausea, light-colored stool, dark yellow or brown urine, yellowing skin or eyes, unusual weakness or fatigue Pain, tingling, or numbness in the hands or feet, muscle weakness, change in vision, confusion or trouble speaking, loss of  balance or coordination, trouble walking, seizures Rash, fever, and swollen lymph nodes Redness, blistering, peeling, or loosening of the skin, including inside the mouth Sudden or severe stomach pain, bloody diarrhea, fever, nausea, vomiting Side effects that usually do not require medical attention (report to your care team if they continue or are bothersome): Bone, joint, or muscle pain Diarrhea Fatigue Loss of appetite Nausea Skin rash This list may not describe all possible side effects. Call your doctor for medical advice about side effects. You may report side effects to FDA at 1-800-FDA-1088. Where should I keep my medication? This medication is given in a hospital or clinic. It will not be stored at home. NOTE: This sheet is a summary. It may not cover all possible information. If you have questions about this medicine, talk to your doctor, pharmacist, or health care provider.  2024 Elsevier/Gold Standard (2021-12-15 00:00:00)   BELOW ARE SYMPTOMS THAT SHOULD BE REPORTED IMMEDIATELY: *FEVER GREATER THAN 100.4 F (38 C) OR HIGHER *CHILLS OR SWEATING *NAUSEA AND VOMITING THAT IS NOT CONTROLLED WITH YOUR NAUSEA MEDICATION *UNUSUAL SHORTNESS OF BREATH *UNUSUAL BRUISING OR BLEEDING *URINARY PROBLEMS (pain or burning when urinating, or frequent urination) *BOWEL PROBLEMS (unusual diarrhea, constipation, pain near the anus) TENDERNESS IN MOUTH AND THROAT WITH OR WITHOUT PRESENCE OF ULCERS (sore throat, sores in mouth, or a toothache) UNUSUAL RASH, SWELLING OR PAIN  UNUSUAL VAGINAL DISCHARGE OR ITCHING   Items  with * indicate a potential emergency and should be followed up as soon as possible or go to the Emergency Department if any problems should occur.  Please show the CHEMOTHERAPY ALERT CARD or IMMUNOTHERAPY ALERT CARD at check-in to the Emergency Department and triage nurse.  Should you have questions after your visit or need to cancel or reschedule your appointment, please  contact [facility] AT [facility] [phone]  and follow the prompts.  Office hours are 8:00 a.m. to 4:30 p.m. Monday - Friday. Please note that voicemails left after 4:00 p.m. may not be returned until the following business day.  We are closed weekends and major holidays. You have access to a nurse at all times for urgent questions. Please call the main number to the clinic [phone] and follow the prompts.  For any non-urgent questions, you may also contact your provider using MyChart. We now offer e-Visits for anyone 72 and older to request care online for non-urgent symptoms. For details visit [email or web address].   Also download the MyChart app! Go to the app store, search "MyChart", open the app, select [facility], and log in with your MyChart username and password.

## 2023-01-13 ENCOUNTER — Ambulatory Visit (INDEPENDENT_AMBULATORY_CARE_PROVIDER_SITE_OTHER): Payer: Medicare Other | Admitting: Pulmonary Disease

## 2023-01-13 ENCOUNTER — Encounter: Payer: Self-pay | Admitting: Pulmonary Disease

## 2023-01-13 VITALS — BP 123/88 | HR 104 | Ht 71.0 in | Wt 173.0 lb

## 2023-01-13 DIAGNOSIS — J441 Chronic obstructive pulmonary disease with (acute) exacerbation: Secondary | ICD-10-CM | POA: Diagnosis not present

## 2023-01-13 MED ORDER — AMOXICILLIN-POT CLAVULANATE 875-125 MG PO TABS: 1.0000 | ORAL_TABLET | Freq: Two times a day (BID) | ORAL | 0 refills | Status: DC

## 2023-01-13 MED ORDER — PREDNISONE 10 MG PO TABS: ORAL_TABLET | ORAL | 0 refills | Status: AC

## 2023-01-13 MED ORDER — BUDESONIDE-FORMOTEROL FUMARATE 160-4.5 MCG/ACT IN AERO: 2.0000 | INHALATION_SPRAY | Freq: Two times a day (BID) | RESPIRATORY_TRACT | 6 refills | Status: AC

## 2023-01-13 NOTE — Patient Instructions (Signed)
Prednisone 10 mg pill >> 4 pills daily for 2 days, 3 pills daily for 2 days, 2 pills daily for 2 days, 1 pill daily for 2 days  Augmentin 1 pill twice daily for 7 days  Symbicort two puffs in the morning and two puffs in the evening and rinse your mouth after each use  Follow up in 4 weeks

## 2023-01-13 NOTE — Progress Notes (Signed)
[name], Critical Care, and Sleep Medicine  Chief Complaint  Patient presents with   Follow-up    Pt f/u sent [place] [facility] per Dr.Olalere. Pt complains of increased SOB & coughing w/ white sputum coming up. States that he was admitted on 5/9 for PNA. He has finished all abx prescribed by hospitalist.     Past Surgical History:  He  has a past surgical history that includes Colonoscopy (last one 06/ 2018); Esophagogastroduodenoscopy (12-05-2014); Cardiovascular stress test (12-14-2005); transthoracic echocardiogram (01-31-2008); Cardiac catheterization (1997); Cataract extraction w/ intraocular lens  implant, bilateral (2011); Transurethral resection of bladder tumor (N/A, 12/31/2014); Cystoscopy with biopsy (N/A, 08/12/2015); Cystoscopy with urethral dilatation (N/A, 03/21/2017); Transurethral resection of bladder tumor (N/A, 03/21/2017); Cystoscopy with injection (N/A, 06/29/2017); IR IMAGING GUIDED PORT INSERTION (02/07/2018); IR REMOVAL TUN ACCESS W/ PORT W/O FL MOD SED (04/14/2018); and IR NEPHROSTOMY PLACEMENT LEFT (12/25/2021).  Past Medical History:  Bladder cancer, BPH, GERD, Colon polyps, Hiatal hernia, Urethral stricture  Constitutional:  BP 123/88   Pulse (!) 104   Ht 5\' 11"  (1.803 m)   Wt 173 lb (78.5 kg)   SpO2 94%   BMI 24.13 kg/m   Brief Summary:  [name] is a 82 y.o. male with cough, dyspnea and pleural effusion.      Subjective:   He is here with his wife.    Previously seen by Dr. Wynona Neat.  He was in hospital recently for pneumonia.  He feels weak and doesn't have much appetite.  He has cough with white sputum.  Has wheezing in his chest.  Gets short of breath with any activity.  Albuterol seems to help some, but doesn't last.  Physical Exam:   Appearance - well kempt   ENMT - no sinus tenderness, no oral exudate, no LAN, Mallampati 2 airway, no stridor  Respiratory - bilateral expiratory wheezing  CV - s1s2 regular rate and rhythm, no  murmurs  Ext - no clubbing, no edema  Skin - no rashes  Psych - normal mood and affect   [name] testing:  Rt pleural fluid 06/21/22 >> protein 3.3, LDH 80, WBC 350 (85%L), cytology negative Rt pleural fluid 11/29/22 >> protein 3.2, LDH 75, WBC 465 (61%L), cytology negative PFT 12/01/22 >> FEV1 1.21 (41%), FEV1% 57, TLC 5.12 (70%), DLCO 61% Lt pleural fluid 12/21/22 >> protein 3.3, WBC 2193 (94%L), cytology negative  Chest Imaging:  CT angio chest 12/20/22 >> mod Lt and small Rt effusions, patchy GGO in LUL, 11 x 7 mm nodule LUL, patchy opacities RUL, narrowing of Rt bronchi  Cardiac Tests:  Echo 12/23/22 >> EF 60 to 65%, grade 1 DD  Social History:  He  reports that he quit smoking about 34 years ago. His smoking use included cigarettes. He has a 34.00 pack-year smoking history. He has never used smokeless tobacco. He reports current alcohol use of about 6.0 standard drinks of alcohol per week. He reports that he does not use drugs.  Family History:  His family history includes Alcoholism in his father.     Assessment/Plan:   COPD with acute exacerbation. - he had trouble with previous trial of trelegy due to throat irritation - will give him course of augmentin and prednisone - will start symbicort 160 two puffs bid - prn albuterol  Pleural effusions. - improved after recent thoracentesis - monitor clinically  Chronic idiopathic urticaria. - followed by allergy/asthma - on xolair  Paroxysmal atrial fibrillation. - followed by Dr. Lewayne Bunting with  cardiology  Metastatic urothelial carcinoma of bladder with lung and bone involvement. - followed by Dr. Ellin Saba with oncology  Time Spent Involved in Patient Care on Day of Examination:  47 minutes  Follow up:   Patient Instructions  Prednisone 10 mg pill >> 4 pills daily for 2 days, 3 pills daily for 2 days, 2 pills daily for 2 days, 1 pill daily for 2 days  Augmentin 1 pill twice daily for 7 days  Symbicort  two puffs in the morning and two puffs in the evening and rinse your mouth after each use  Follow up in 4 weeks  Medication List:   Allergies as of 01/13/2023       Reactions   Ciprofloxacin Hives   Zofran [ondansetron] Rash        Medication List        Accurate as of Jan 13, 2023  2:46 PM. If you have any questions, ask your nurse or doctor.          acetaminophen 500 MG tablet Commonly known as: TYLENOL Take 1,000 mg by mouth every 6 (six) hours as needed for mild pain, fever, moderate pain or headache.   amoxicillin-clavulanate 875-125 MG tablet Commonly known as: AUGMENTIN Take 1 tablet by mouth 2 (two) times daily. Started by: Coralyn Helling, MD   apixaban 5 MG Tabs tablet Commonly known as: ELIQUIS Take 1 tablet (5 mg total) by mouth 2 (two) times daily.   budesonide-formoterol 160-4.5 MCG/ACT inhaler Commonly known as: Symbicort Inhale 2 puffs into the lungs 2 (two) times daily. Started by: Coralyn Helling, MD   chlorpheniramine-HYDROcodone 10-8 MG/5ML Commonly known as: TUSSIONEX Take 5 mLs by mouth every 12 (twelve) hours as needed (severe cough).   dextromethorphan-guaiFENesin 30-600 MG 12hr tablet Commonly known as: MUCINEX DM Take 1 tablet by mouth 2 (two) times daily.   megestrol 400 MG/10ML suspension Commonly known as: MEGACE Take 10 mLs (400 mg total) by mouth 2 (two) times daily.   methenamine 1 g tablet Commonly known as: HIPREX Take 1 g by mouth 2 (two) times daily.   metoprolol tartrate 25 MG tablet Commonly known as: LOPRESSOR Take 1 tablet (25 mg total) by mouth 2 (two) times daily.   pantoprazole 40 MG tablet Commonly known as: PROTONIX Take 1 tablet (40 mg total) by mouth 2 (two) times daily.   predniSONE 10 MG tablet Commonly known as: DELTASONE Take 4 tablets (40 mg total) by mouth daily with breakfast for 2 days, THEN 3 tablets (30 mg total) daily with breakfast for 2 days, THEN 2 tablets (20 mg total) daily with breakfast for 2  days, THEN 1 tablet (10 mg total) daily with breakfast for 2 days. Start taking on: Jan 13, 2023 Started by: Coralyn Helling, MD        Signature:  Coralyn Helling, MD [name]/Critical Care Pager - [phone] 01/13/2023, 2:46 PM

## 2023-01-31 NOTE — Progress Notes (Signed)
[facility] [address] [place] [address]    Clinic Day:  02/01/2023  Referring physician: Elfredia Nevins, MD  Patient Care Team: Elfredia Nevins, MD as PCP - General (Internal Medicine) Marinus Maw, MD as PCP - Electrophysiology (Cardiology) Doreatha Massed, MD as Medical Oncologist (Medical Oncology) Therese Sarah, RN as Oncology Nurse Navigator (Medical Oncology)   ASSESSMENT & PLAN:   Assessment: 1.  Metastatic urothelial carcinoma of the bladder to the lungs and bones: - Neoadjuvant chemotherapy with gemcitabine and cisplatin cycle 1 on 05/02/2017 - Cystoprostatectomy, bilateral lymphadenectomy by Dr. Berneice Heinrich on 06/29/2017, pathology T3b N0, 0/13 lymph nodes - Developed metastatic disease in April 2019, biopsy of the right ischium on 12/08/2017 with poorly differentiated carcinoma consistent with metastatic urothelial carcinoma. - XRT to the pelvis completed on 01/16/2018 - Carboplatin and gemcitabine 6 cycles from 01/30/2018 through 05/22/2018 - Pembrolizumab every 3 weeks started on 06/21/2018, discontinued in April 2020 due to skin toxicity - XRT to the right paratracheal lymph node, 50 Gray in 10 fractions completed on 04/23/2020 - XRT to the intrathoracic lymph node completed on 11/27/2021  - 12/20/2022: Right thoracentesis with 1.8 L removed - 12/21/2022: Left thoracentesis with 700 mL fluid removed, cytology negative. - Unclear etiology of recurrent effusions, exudative. - 2D echocardiogram: Normal LVEF with grade 1 diastolic dysfunction. - CT angiogram 12/20/2022: Negative for PE.  Multifocal parenchymal opacities.  No adenopathy. - Keytruda started on 01/07/2023.   2.  Social/family history: - Lives at home with his wife. - Worked for [employer].  Quit smoking 40 years ago. - No family history of malignancies.    Plan: 1.  Metastatic urothelial carcinoma of the bladder to the lungs and bones: - He received the first infusion of  Keytruda on 01/07/2023. - She was evaluated by Dr. Craige Cotta and was given prednisone for 4 days tapering on 01/13/2023. - Wife reports that he developed a rash, itching 10 days ago.  Otherwise did not have any major side effects from [facility]. - Reviewed labs today: Normal LFTs.  Creatinine normal.  CBC grossly normal.  TSH was 6.6. - Auscultation: Decreased breath sounds at bilateral bases. - Proceed with cycle 2 today.  Will obtain chest x-ray in 3 weeks to see if he needs thoracentesis.   2.  Dry cough: - Likely from lung infiltrates.  He has cough with occasional clear white sputum.  Continue Robitussin DM daily as needed.   3.  Loss of appetite/weight loss: - He took Megace for few days and stopped it.  He thought he was eating better.  Today he lost about 4 pounds in the last 3 weeks. - I have recommended that he start back on Megace and take it at least once daily.  4.  Immunotherapy induced skin rash: - He has erythematous maculopapular rash on the left neck and right middle back below the scapula.  He is using hydrocortisone cream. - Will send him prednisone 10 mg daily.  Will also give him betamethasone dipropionate cream twice daily in place of cortisone cream. - Will give him Atarax 25 mg 3 times daily as needed for intense itching.    Orders Placed This Encounter  Procedures   DG Chest 2 View    Standing Status:   Future    Standing Expiration Date:   02/01/2024    Order Specific Question:   Reason for Exam (SYMPTOM  OR DIAGNOSIS REQUIRED)    Answer:   cough  Order Specific Question:   Preferred imaging location?    Answer:   [facility]    Order Specific Question:   Release to patient    Answer:   Immediate   Magnesium    Standing Status:   Future    Standing Expiration Date:   06/02/2024   CBC with Differential    Standing Status:   Future    Standing Expiration Date:   06/02/2024   Comprehensive metabolic panel    Standing Status:   Future    Standing  Expiration Date:   06/02/2024   Magnesium    Standing Status:   Future    Standing Expiration Date:   06/23/2024   CBC with Differential    Standing Status:   Future    Standing Expiration Date:   06/23/2024   Comprehensive metabolic panel    Standing Status:   Future    Standing Expiration Date:   06/23/2024   T4    Standing Status:   Future    Standing Expiration Date:   06/23/2024   TSH    Standing Status:   Future    Standing Expiration Date:   06/23/2024      I,Katie Daubenspeck,acting as a scribe for Doreatha Massed, MD.,have documented all relevant documentation on the behalf of Doreatha Massed, MD,as directed by  Doreatha Massed, MD while in the presence of Doreatha Massed, MD.   I, Doreatha Massed MD, have reviewed the above documentation for accuracy and completeness, and I agree with the above.   Doreatha Massed, MD   6/18/20242:45 PM  CHIEF COMPLAINT:   Diagnosis: Metastatic urothelial carcinoma of the bladder    Cancer Staging  No matching staging information was found for the patient.   Prior Therapy: 1. Neoadjuvant gemcitabine/cisplatin on 05/02/17 (one cycle) 2. Cystoprostatectomy and bilateral lymphadenectomy on 06/29/2017 (Dr. Berneice Heinrich) 3. Radiation therapy to the pelvic lesion 01/02/18 - 01/16/18 4. Carboplatin and gemcitabine, 6 cycles 02/06/18 - 05/22/18 5. Pembrolizumab 06/21/18 - 12/13/18 6. Radiation therapy to RUL and right paratracheal nodes 04/09/20 - 04/23/20 7. Radiation therapy to intrathoracic lymph nodes 11/16/21 - 11/27/21  Current Therapy:  pembrolizumab    HISTORY OF PRESENT ILLNESS:   Oncology History  Stage IV malignant neoplasm of urinary bladder /with mets to the bones and lungs  04/20/2017 Initial Diagnosis   Malignant neoplasm of urinary bladder (HCC)   01/30/2018 - 05/22/2018 Chemotherapy   The patient had palonosetron (ALOXI) injection 0.25 mg, 0.25 mg, Intravenous,  Once, 6 of 6 cycles Administration: 0.25 mg  (01/30/2018), 0.25 mg (02/20/2018), 0.25 mg (03/13/2018), 0.25 mg (04/03/2018), 0.25 mg (05/01/2018), 0.25 mg (05/22/2018) CARBOplatin (PARAPLATIN) 400 mg in sodium chloride 0.9 % 250 mL chemo infusion, 400 mg (100 % of original dose 400 mg), Intravenous,  Once, 6 of 6 cycles Dose modification: 400 mg (original dose 400 mg, Cycle 1),   (original dose 400 mg, Cycle 2, Reason: Patient Age), 410 mg (original dose 400 mg, Cycle 6) Administration: 400 mg (01/30/2018), 400 mg (02/20/2018), 400 mg (03/13/2018), 410 mg (04/03/2018), 410 mg (05/01/2018), 410 mg (05/22/2018) gemcitabine (GEMZAR) 2,128 mg in sodium chloride 0.9 % 250 mL chemo infusion, 1,000 mg/m2 = 2,128 mg, Intravenous,  Once, 6 of 6 cycles Dose modification: 800 mg/m2 (original dose 1,000 mg/m2, Cycle 4, Reason: Provider Judgment), 800 mg/m2 (original dose 1,000 mg/m2, Cycle 4, Reason: Provider Judgment) Administration: 2,128 mg (01/30/2018), 2,128 mg (02/06/2018), 2,128 mg (02/20/2018), 2,128 mg (02/27/2018), 2,128 mg (03/13/2018), 1,710 mg (04/03/2018), 1,710  mg (04/10/2018), 1,710 mg (05/01/2018), 1,710 mg (05/22/2018) fosaprepitant (EMEND) 150 mg, dexamethasone (DECADRON) 12 mg in sodium chloride 0.9 % 145 mL IVPB, , Intravenous,  Once, 6 of 6 cycles Administration:  (01/30/2018),  (02/20/2018),  (03/13/2018),  (04/03/2018),  (05/01/2018),  (05/22/2018)  for chemotherapy treatment.    01/07/2023 -  Chemotherapy   Patient is on Treatment Plan : BLADDER Pembrolizumab (200) q21d     Bladder cancer (HCC)  06/28/2017 Initial Diagnosis   Bladder cancer (HCC)   06/21/2018 - 12/13/2018 Chemotherapy   The patient had pembrolizumab (KEYTRUDA) 200 mg in sodium chloride 0.9 % 50 mL chemo infusion, 200 mg, Intravenous, Once, 8 of 9 cycles Administration: 200 mg (06/21/2018), 200 mg (07/12/2018), 200 mg (08/02/2018), 200 mg (08/23/2018), 200 mg (09/13/2018), 200 mg (10/04/2018), 200 mg (11/22/2018), 200 mg (12/13/2018)  for chemotherapy treatment.       INTERVAL HISTORY:   Arsh  is a 82 y.o. male presenting to clinic today for follow up of Metastatic urothelial carcinoma of the bladder. He was last seen by me on 01/06/23.  Today, he states that he is doing well overall. His appetite level is at 25%. His energy level is at 10%.  PAST MEDICAL HISTORY:   Past Medical History: Past Medical History:  Diagnosis Date   Bladder cancer (HCC)    Bone cancer (HCC)    BPH (benign prostatic hypertrophy)    Dysuria    GERD (gastroesophageal reflux disease)    History of adenomatous polyp of colon    tubular adenoma 06/ 2018   History of bladder cancer 12/2014   urologist-  dr Sherryl Barters--  dx High Grade TCC without stromal invasion s/p TURBT and intravesical BCG tx/  recurrent 07/2015 (pTa) TCC  repear intravesical BCG tx    History of hiatal hernia    History of urethral stricture    Nocturia     Surgical History: Past Surgical History:  Procedure Laterality Date   CARDIAC CATHETERIZATION  1997   normal coronary arteries   CARDIOVASCULAR STRESS TEST  12-14-2005   Low risk perfusion study/  very small scar in the inferior septum from mid ventricle to apex,  no ischemia/  mild inferior septal hypokinesis/  ef 58%   CATARACT EXTRACTION W/ INTRAOCULAR LENS  IMPLANT, BILATERAL  2011   COLONOSCOPY  last one 06/ 2018   CYSTOSCOPY WITH BIOPSY N/A 08/12/2015   Procedure: CYSTOSCOPY WITH BIOPSY AND FULGERATION;  Surgeon: Hildred Laser, MD;  Location: [facility];  Service: Urology;  Laterality: N/A;   CYSTOSCOPY WITH INJECTION N/A 06/29/2017   Procedure: CYSTOSCOPY WITH INJECTION OF INDOCYANINE GREEN DYE;  Surgeon: Sebastian Ache, MD;  Location: [facility];  Service: Urology;  Laterality: N/A;   CYSTOSCOPY WITH URETHRAL DILATATION N/A 03/21/2017   Procedure: CYSTOSCOPY;  Surgeon: Hildred Laser, MD;  Location: [facility];  Service: Urology;  Laterality: N/A;   ESOPHAGOGASTRODUODENOSCOPY  12-05-2014   IR IMAGING GUIDED PORT INSERTION  02/07/2018    IR NEPHROSTOMY PLACEMENT LEFT  12/25/2021   IR REMOVAL TUN ACCESS W/ PORT W/O FL MOD SED  04/14/2018   TRANSTHORACIC ECHOCARDIOGRAM  01-31-2008   nomral LVF,  ef 55-65%/  mild pulmonic stenosis without regurg.,  peak transpulomonic valve gradient   TRANSURETHRAL RESECTION OF BLADDER TUMOR N/A 12/31/2014   Procedure: TRANSURETHRAL RESECTION OF BLADDER TUMOR (TURBT);  Surgeon: Su Grand, MD;  Location: [facility];  Service: Urology;  Laterality: N/A;   TRANSURETHRAL RESECTION OF BLADDER  TUMOR N/A 03/21/2017   Procedure: POSSIBLE TRANSURETHRAL RESECTION OF BLADDER TUMOR (TURBT);  Surgeon: Hildred Laser, MD;  Location: [facility];  Service: Urology;  Laterality: N/A;    Social History: Social History   Socioeconomic History   Marital status: Married    Spouse name: Not on file   Number of children: 1   Years of education: Not on file   Highest education level: Not on file  Occupational History   Occupation: Retired  Tobacco Use   Smoking status: Former    Packs/day: 1.00    Years: 34.00    Additional pack years: 0.00    Total pack years: 34.00    Types: Cigarettes    Quit date: 12/26/1988    Years since quitting: 34.1   Smokeless tobacco: Never  Vaping Use   Vaping Use: Never used  Substance and Sexual Activity   Alcohol use: Yes    Alcohol/week: 6.0 standard drinks of alcohol    Types: 6 Cans of beer per week    Comment: BEER   Drug use: No   Sexual activity: Not Currently  Other Topics Concern   Not on file  Social History Narrative   Not on file   Social Determinants of Health   Financial Resource Strain: Not on file  Food Insecurity: No Food Insecurity (12/20/2022)   Hunger Vital Sign    Worried About Running Out of Food in the Last Year: Never true    Ran Out of Food in the Last Year: Never true  Transportation Needs: No Transportation Needs (12/20/2022)   PRAPARE - Administrator, Civil Service (Medical): No     Lack of Transportation (Non-Medical): No  Physical Activity: Not on file  Stress: Not on file  Social Connections: Not on file  Intimate Partner Violence: Not At Risk (12/20/2022)   Humiliation, Afraid, Rape, and Kick questionnaire    Fear of Current or Ex-Partner: No    Emotionally Abused: No    Physically Abused: No    Sexually Abused: No    Family History: Family History  Problem Relation Age of Onset   Alcoholism Father    Colon cancer Neg Hx    Colon polyps Neg Hx    Diabetes Neg Hx    Kidney disease Neg Hx    Esophageal cancer Neg Hx    Heart disease Neg Hx    Gallbladder disease Neg Hx    Allergic rhinitis Neg Hx    Angioedema Neg Hx    Asthma Neg Hx    Atopy Neg Hx    Eczema Neg Hx    Immunodeficiency Neg Hx    Urticaria Neg Hx     Current Medications:  Current Outpatient Medications:    acetaminophen (TYLENOL) 500 MG tablet, Take 1,000 mg by mouth every 6 (six) hours as needed for mild pain, fever, moderate pain or headache., Disp: , Rfl:    amoxicillin-clavulanate (AUGMENTIN) 875-125 MG tablet, Take 1 tablet by mouth 2 (two) times daily., Disp: 14 tablet, Rfl: 0   apixaban (ELIQUIS) 5 MG TABS tablet, Take 1 tablet (5 mg total) by mouth 2 (two) times daily., Disp: 60 tablet, Rfl: 2   betamethasone dipropionate 0.05 % cream, Apply topically 2 (two) times daily., Disp: 60 g, Rfl: 2   budesonide-formoterol (SYMBICORT) 160-4.5 MCG/ACT inhaler, Inhale 2 puffs into the lungs 2 (two) times daily., Disp: 1 each, Rfl: 6   chlorpheniramine-HYDROcodone (TUSSIONEX) 10-8 MG/5ML, Take 5 mLs by mouth  every 12 (twelve) hours as needed (severe cough)., Disp: 70 mL, Rfl: 0   dextromethorphan-guaiFENesin (MUCINEX DM) 30-600 MG 12hr tablet, Take 1 tablet by mouth 2 (two) times daily., Disp: , Rfl:    hydrOXYzine (ATARAX) 25 MG tablet, Take 1 tablet (25 mg total) by mouth 3 (three) times daily as needed., Disp: 90 tablet, Rfl: 1   megestrol (MEGACE) 400 MG/10ML suspension, Take 10 mLs  (400 mg total) by mouth 2 (two) times daily., Disp: 480 mL, Rfl: 3   methenamine (HIPREX) 1 g tablet, Take 1 g by mouth 2 (two) times daily., Disp: , Rfl:    metoprolol tartrate (LOPRESSOR) 25 MG tablet, Take 1 tablet (25 mg total) by mouth 2 (two) times daily., Disp: 60 tablet, Rfl: 2   pantoprazole (PROTONIX) 40 MG tablet, Take 1 tablet (40 mg total) by mouth 2 (two) times daily., Disp: 60 tablet, Rfl: 0   predniSONE (DELTASONE) 10 MG tablet, Take 1 tablet (10 mg total) by mouth daily with breakfast., Disp: 30 tablet, Rfl: 4  Current Facility-Administered Medications:    omalizumab Geoffry Paradise) prefilled syringe 300 mg, 300 mg, Subcutaneous, Q14 Days, Alfonse Spruce, MD, 300 mg at 11/24/22 1610  Facility-Administered Medications Ordered in Other Visits:    pembrolizumab (KEYTRUDA) 200 mg in sodium chloride 0.9 % 50 mL chemo infusion, 200 mg, Intravenous, Once, Doreatha Massed, MD   Allergies: Allergies  Allergen Reactions   Ciprofloxacin Hives   Zofran [Ondansetron] Rash    REVIEW OF SYSTEMS:   Review of Systems  Constitutional:  Negative for chills, fatigue and fever.  HENT:   Negative for lump/mass, mouth sores, nosebleeds, sore throat and trouble swallowing.   Eyes:  Negative for eye problems.  Respiratory:  Positive for cough and shortness of breath.   Cardiovascular:  Negative for chest pain, leg swelling and palpitations.  Gastrointestinal:  Negative for abdominal pain, constipation, diarrhea, nausea and vomiting.  Genitourinary:  Negative for bladder incontinence, difficulty urinating, dysuria, frequency, hematuria and nocturia.   Musculoskeletal:  Negative for arthralgias, back pain, flank pain, myalgias and neck pain.  Skin:  Positive for itching and rash.  Neurological:  Negative for dizziness, headaches and numbness.  Hematological:  Does not bruise/bleed easily.  Psychiatric/Behavioral:  Positive for sleep disturbance. Negative for depression and suicidal ideas.  The patient is not nervous/anxious.   All other systems reviewed and are negative.    VITALS:   Blood pressure 111/78, pulse (!) 114, temperature 98.7 F (37.1 C), temperature source Tympanic, resp. rate 20, weight 168 lb 4.8 oz (76.3 kg), SpO2 96 %.  Wt Readings from Last 3 Encounters:  02/01/23 168 lb 4.8 oz (76.3 kg)  01/13/23 173 lb (78.5 kg)  01/06/23 172 lb 6.4 oz (78.2 kg)    Body mass index is 23.47 kg/m.  Performance status (ECOG): 1 - Symptomatic but completely ambulatory  PHYSICAL EXAM:   Physical Exam Vitals and nursing note reviewed. Exam conducted with a chaperone present.  Constitutional:      Appearance: Normal appearance.  Cardiovascular:     Rate and Rhythm: Normal rate and regular rhythm.     Pulses: Normal pulses.     Heart sounds: Normal heart sounds.  Pulmonary:     Effort: Pulmonary effort is normal.     Breath sounds: Normal breath sounds.  Abdominal:     Palpations: Abdomen is soft. There is no hepatomegaly, splenomegaly or mass.     Tenderness: There is no abdominal tenderness.  Musculoskeletal:  Right lower leg: No edema.     Left lower leg: No edema.  Lymphadenopathy:     Cervical: No cervical adenopathy.     Right cervical: No superficial, deep or posterior cervical adenopathy.    Left cervical: No superficial, deep or posterior cervical adenopathy.     Upper Body:     Right upper body: No supraclavicular or axillary adenopathy.     Left upper body: No supraclavicular or axillary adenopathy.  Neurological:     General: No focal deficit present.     Mental Status: He is alert and oriented to person, place, and time.  Psychiatric:        Mood and Affect: Mood normal.        Behavior: Behavior normal.     LABS:      Latest Ref Rng & Units 02/01/2023   12:57 PM 01/06/2023   10:13 AM 12/22/2022    1:55 AM  CBC  WBC 4.0 - 10.5 K/uL 10.1  7.1  5.6   Hemoglobin 13.0 - 17.0 g/dL 08.6  57.8  46.9   Hematocrit 39.0 - 52.0 % 43.4  46.2   37.3   Platelets 150 - 400 K/uL 277  264  228       Latest Ref Rng & Units 02/01/2023   12:57 PM 01/06/2023   10:13 AM 12/23/2022    4:12 AM  CMP  Glucose 70 - 99 mg/dL 629  528  413   BUN 8 - 23 mg/dL 19  15  11    Creatinine 0.61 - 1.24 mg/dL 2.44  0.10  2.72   Sodium 135 - 145 mmol/L 137  139  135   Potassium 3.5 - 5.1 mmol/L 3.5  3.9  4.0   Chloride 98 - 111 mmol/L 107  104  102   CO2 22 - 32 mmol/L 19  24  27    Calcium 8.9 - 10.3 mg/dL 8.7  8.9  8.5   Total Protein 6.5 - 8.1 g/dL 6.5  6.0    Total Bilirubin 0.3 - 1.2 mg/dL 0.7  0.6    Alkaline Phos 38 - 126 U/L 55  61    AST 15 - 41 U/L 25  22    ALT 0 - 44 U/L 31  20       No results found for: "CEA1", "CEA" / No results found for: "CEA1", "CEA" No results found for: "PSA1" No results found for: "ZDG644" No results found for: "CAN125"  No results found for: "TOTALPROTELP", "ALBUMINELP", "A1GS", "A2GS", "BETS", "BETA2SER", "GAMS", "MSPIKE", "SPEI" Lab Results  Component Value Date   TIBC 249 03/20/2018   FERRITIN 759 (H) 03/20/2018   IRONPCTSAT 20 (L) 03/20/2018   Lab Results  Component Value Date   LDH 96 (L) 06/21/2022     STUDIES:   No results found.

## 2023-02-01 ENCOUNTER — Inpatient Hospital Stay (HOSPITAL_BASED_OUTPATIENT_CLINIC_OR_DEPARTMENT_OTHER): Payer: Medicare Other | Admitting: Hematology

## 2023-02-01 ENCOUNTER — Inpatient Hospital Stay: Payer: Medicare Other | Attending: Oncology

## 2023-02-01 ENCOUNTER — Inpatient Hospital Stay: Payer: Medicare Other

## 2023-02-01 VITALS — BP 114/79 | HR 99 | Temp 97.7°F | Resp 16

## 2023-02-01 VITALS — BP 111/78 | HR 114 | Temp 98.7°F | Resp 20 | Wt 168.3 lb

## 2023-02-01 DIAGNOSIS — C771 Secondary and unspecified malignant neoplasm of intrathoracic lymph nodes: Secondary | ICD-10-CM

## 2023-02-01 DIAGNOSIS — C679 Malignant neoplasm of bladder, unspecified: Secondary | ICD-10-CM

## 2023-02-01 DIAGNOSIS — Z5112 Encounter for antineoplastic immunotherapy: Secondary | ICD-10-CM | POA: Diagnosis not present

## 2023-02-01 DIAGNOSIS — C7802 Secondary malignant neoplasm of left lung: Secondary | ICD-10-CM | POA: Diagnosis not present

## 2023-02-01 DIAGNOSIS — C7801 Secondary malignant neoplasm of right lung: Secondary | ICD-10-CM | POA: Insufficient documentation

## 2023-02-01 DIAGNOSIS — C7951 Secondary malignant neoplasm of bone: Secondary | ICD-10-CM | POA: Diagnosis not present

## 2023-02-01 LAB — COMPREHENSIVE METABOLIC PANEL
ALT: 31 U/L (ref 0–44)
AST: 25 U/L (ref 15–41)
Albumin: 2.8 g/dL — ABNORMAL LOW (ref 3.5–5.0)
Alkaline Phosphatase: 55 U/L (ref 38–126)
Anion gap: 11 (ref 5–15)
BUN: 19 mg/dL (ref 8–23)
CO2: 19 mmol/L — ABNORMAL LOW (ref 22–32)
Calcium: 8.7 mg/dL — ABNORMAL LOW (ref 8.9–10.3)
Chloride: 107 mmol/L (ref 98–111)
Creatinine, Ser: 0.91 mg/dL (ref 0.61–1.24)
GFR, Estimated: >60 mL/min (ref >60)
Glucose, Bld: 115 mg/dL — ABNORMAL HIGH (ref 70–99)
Potassium: 3.5 mmol/L (ref 3.5–5.1)
Sodium: 137 mmol/L (ref 135–145)
Total Bilirubin: 0.7 mg/dL (ref 0.3–1.2)
Total Protein: 6.5 g/dL (ref 6.5–8.1)

## 2023-02-01 LAB — CBC WITH DIFFERENTIAL/PLATELET
Abs Immature Granulocytes: 0.03 10*3/uL (ref 0.00–0.07)
Basophils Absolute: 0.1 10*3/uL (ref 0.0–0.1)
Basophils Relative: 1 %
Eosinophils Absolute: 0.3 10*3/uL (ref 0.0–0.5)
Eosinophils Relative: 3 %
HCT: 43.4 % (ref 39.0–52.0)
Hemoglobin: 14.3 g/dL (ref 13.0–17.0)
Immature Granulocytes: 0 %
Lymphocytes Relative: 9 %
Lymphs Abs: 0.9 10*3/uL (ref 0.7–4.0)
MCH: 35.5 pg — ABNORMAL HIGH (ref 26.0–34.0)
MCHC: 32.9 g/dL (ref 30.0–36.0)
MCV: 107.7 fL — ABNORMAL HIGH (ref 80.0–100.0)
Monocytes Absolute: 1.1 10*3/uL — ABNORMAL HIGH (ref 0.1–1.0)
Monocytes Relative: 11 %
Neutro Abs: 7.7 10*3/uL (ref 1.7–7.7)
Neutrophils Relative %: 76 %
Platelets: 277 10*3/uL (ref 150–400)
RBC: 4.03 MIL/uL — ABNORMAL LOW (ref 4.22–5.81)
RDW: 13.5 % (ref 11.5–15.5)
WBC: 10.1 10*3/uL (ref 4.0–10.5)
nRBC: 0 % (ref 0.0–0.2)

## 2023-02-01 LAB — MAGNESIUM: Magnesium: 2 mg/dL (ref 1.7–2.4)

## 2023-02-01 MED ORDER — SODIUM CHLORIDE 0.9 % IV SOLN
200.0000 mg | Freq: Once | INTRAVENOUS | Status: AC
  Administered 2023-02-01: 200 mg via INTRAVENOUS
  Filled 2023-02-01: qty 8

## 2023-02-01 MED ORDER — HYDROXYZINE HCL 25 MG PO TABS: 25.0000 mg | ORAL_TABLET | Freq: Three times a day (TID) | ORAL | 1 refills | Status: DC | PRN

## 2023-02-01 MED ORDER — BETAMETHASONE DIPROPIONATE 0.05 % EX CREA: TOPICAL_CREAM | Freq: Two times a day (BID) | CUTANEOUS | 2 refills | Status: DC

## 2023-02-01 MED ORDER — PREDNISONE 10 MG PO TABS: 10.0000 mg | ORAL_TABLET | Freq: Every day | ORAL | 4 refills | Status: DC

## 2023-02-01 MED ORDER — SODIUM CHLORIDE 0.9 % IV SOLN: Freq: Once | INTRAVENOUS | Status: AC

## 2023-02-01 NOTE — Patient Instructions (Signed)
[facility]  Discharge Instructions: Thank you for choosing [facility] to provide your oncology and hematology care.  If you have a lab appointment with the Cancer Center - please note that after April 8th, 2024, all labs will be drawn in the cancer center.  You do not have to check in or register with the main entrance as you have in the past but will complete your check-in in the cancer center.  Wear comfortable clothing and clothing appropriate for easy access to any Portacath or PICC line.   We strive to give you quality time with your provider. You may need to reschedule your appointment if you arrive late (15 or more minutes).  Arriving late affects you and other patients whose appointments are after yours.  Also, if you miss three or more appointments without notifying the office, you may be dismissed from the clinic at the provider's discretion.      For prescription refill requests, have your pharmacy contact our office and allow 72 hours for refills to be completed.    Today you received the following chemotherapy and/or immunotherapy agents Keytruda      To help prevent nausea and vomiting after your treatment, we encourage you to take your nausea medication as directed.  BELOW ARE SYMPTOMS THAT SHOULD BE REPORTED IMMEDIATELY: *FEVER GREATER THAN 100.4 F (38 C) OR HIGHER *CHILLS OR SWEATING *NAUSEA AND VOMITING THAT IS NOT CONTROLLED WITH YOUR NAUSEA MEDICATION *UNUSUAL SHORTNESS OF BREATH *UNUSUAL BRUISING OR BLEEDING *URINARY PROBLEMS (pain or burning when urinating, or frequent urination) *BOWEL PROBLEMS (unusual diarrhea, constipation, pain near the anus) TENDERNESS IN MOUTH AND THROAT WITH OR WITHOUT PRESENCE OF ULCERS (sore throat, sores in mouth, or a toothache) UNUSUAL RASH, SWELLING OR PAIN  UNUSUAL VAGINAL DISCHARGE OR ITCHING   Items with * indicate a potential emergency and should be followed up as soon as possible or go to the  Emergency Department if any problems should occur.  Please show the CHEMOTHERAPY ALERT CARD or IMMUNOTHERAPY ALERT CARD at check-in to the Emergency Department and triage nurse.  Should you have questions after your visit or need to cancel or reschedule your appointment, please contact [facility] [phone]  and follow the prompts.  Office hours are 8:00 a.m. to 4:30 p.m. Monday - Friday. Please note that voicemails left after 4:00 p.m. may not be returned until the following business day.  We are closed weekends and major holidays. You have access to a nurse at all times for urgent questions. Please call the main number to the clinic [phone] and follow the prompts.  For any non-urgent questions, you may also contact your provider using MyChart. We now offer e-Visits for anyone 18 and older to request care online for non-urgent symptoms. For details visit [email or web address].   Also download the MyChart app! Go to the app store, search "MyChart", open the app, select [facility], and log in with your MyChart username and password.   

## 2023-02-01 NOTE — Patient Instructions (Addendum)
[facility]  Discharge Instructions  You were seen and examined today by Dr. Ellin Saba.  Dr. Ellin Saba discussed your most recent lab work which revealed that everything looks stable.  Dr. Ellin Saba wants you to take the Megace once daily to help with your appetite. You can mix it with orange juice to help with the taste.  Dr. Ellin Saba sent in Prednisone 10 to be taken once daily,  Betamethasone cream to take twice daily, Atarax to take three times daily as needed to help with the itching.  Follow-up as scheduled in 3 weeks.    Thank you for choosing [facility] to provide your oncology and hematology care.   To afford each patient quality time with our provider, please arrive at least 15 minutes before your scheduled appointment time. You may need to reschedule your appointment if you arrive late (10 or more minutes). Arriving late affects you and other patients whose appointments are after yours.  Also, if you miss three or more appointments without notifying the office, you may be dismissed from the clinic at the provider's discretion.    Again, thank you for choosing [facility].  Our hope is that these requests will decrease the amount of time that you wait before being seen by our physicians.   If you have a lab appointment with the Cancer Center - please note that after April 8th, all labs will be drawn in the cancer center.  You do not have to check in or register with the main entrance as you have in the past but will complete your check-in at the cancer center.            _____________________________________________________________  Should you have questions after your visit to [facility], please contact our office at [phone] and follow the prompts.  Our office hours are 8:00 a.m. to 4:30 p.m. Monday - Thursday and 8:00 a.m. to 2:30 p.m. Friday.  Please note that voicemails left after  4:00 p.m. may not be returned until the following business day.  We are closed weekends and all major holidays.  You do have access to a nurse 24-7, just call the main number to the clinic [phone] and do not press any options, hold on the line and a nurse will answer the phone.    For prescription refill requests, have your pharmacy contact our office and allow 72 hours.    Masks are no longer required in the cancer centers. If you would like for your care team to wear a mask while they are taking care of you, please let them know. You may have one support person who is at least 82 years old accompany you for your appointments.

## 2023-02-01 NOTE — Progress Notes (Signed)
Treatment given per orders. Patient tolerated it well without problems. Vitals stable and discharged home from clinic via wheelchair Follow up as scheduled.  

## 2023-02-01 NOTE — Progress Notes (Signed)
Patient has been assessed, vital signs and labs have been reviewed by Dr. Katragadda. ANC, Creatinine, LFTs, and Platelets are within treatment parameters per Dr. Katragadda. The patient is good to proceed with treatment at this time. Primary RN and pharmacy aware.  

## 2023-02-01 NOTE — Progress Notes (Signed)
Ok to treat with HR 114  Dr. Carilyn Goodpasture, PharmD

## 2023-02-09 ENCOUNTER — Ambulatory Visit (HOSPITAL_COMMUNITY): Payer: Medicare Other | Admitting: Physical Therapy

## 2023-02-09 ENCOUNTER — Other Ambulatory Visit: Payer: Self-pay

## 2023-02-09 ENCOUNTER — Ambulatory Visit (HOSPITAL_COMMUNITY): Payer: Medicare Other | Attending: Hematology | Admitting: Physical Therapy

## 2023-02-09 DIAGNOSIS — R262 Difficulty in walking, not elsewhere classified: Secondary | ICD-10-CM | POA: Insufficient documentation

## 2023-02-09 DIAGNOSIS — M6281 Muscle weakness (generalized): Secondary | ICD-10-CM | POA: Insufficient documentation

## 2023-02-09 NOTE — Therapy (Signed)
OUTPATIENT PHYSICAL THERAPY LOWER EXTREMITY EVALUATION   Patient Name: [name] MRN: [id number] DOB:1941-07-14, 82 y.o., male Today's Date: 02/09/2023  END OF SESSION:  PT End of Session - 02/09/23 1727     Visit Number 1    Number of Visits 8    Date for PT Re-Evaluation 04/06/23    Authorization Type medicare A    Progress Note Due on Visit 10    PT Start Time 1430    PT Stop Time 1515    PT Time Calculation (min) 45 min    Activity Tolerance Patient tolerated treatment well             Past Medical History:  Diagnosis Date   Bladder cancer (HCC)    Bone cancer (HCC)    BPH (benign prostatic hypertrophy)    Dysuria    GERD (gastroesophageal reflux disease)    History of adenomatous polyp of colon    tubular adenoma 06/ 2018   History of bladder cancer 12/2014   urologist-  dr Sherryl Barters--  dx High Grade TCC without stromal invasion s/p TURBT and intravesical BCG tx/  recurrent 07/2015 (pTa) TCC  repear intravesical BCG tx    History of hiatal hernia    History of urethral stricture    Nocturia    Past Surgical History:  Procedure Laterality Date   CARDIAC CATHETERIZATION  1997   normal coronary arteries   CARDIOVASCULAR STRESS TEST  12-14-2005   Low risk perfusion study/  very small scar in the inferior septum from mid ventricle to apex,  no ischemia/  mild inferior septal hypokinesis/  ef 58%   CATARACT EXTRACTION W/ INTRAOCULAR LENS  IMPLANT, BILATERAL  2011   COLONOSCOPY  last one 06/ 2018   CYSTOSCOPY WITH BIOPSY N/A 08/12/2015   Procedure: CYSTOSCOPY WITH BIOPSY AND FULGERATION;  Surgeon: Hildred Laser, MD;  Location: [facility];  Service: Urology;  Laterality: N/A;   CYSTOSCOPY WITH INJECTION N/A 06/29/2017   Procedure: CYSTOSCOPY WITH INJECTION OF INDOCYANINE GREEN DYE;  Surgeon: Sebastian Ache, MD;  Location: [facility];  Service: Urology;  Laterality: N/A;   CYSTOSCOPY WITH URETHRAL DILATATION N/A 03/21/2017   Procedure: CYSTOSCOPY;   Surgeon: Hildred Laser, MD;  Location: [facility];  Service: Urology;  Laterality: N/A;   ESOPHAGOGASTRODUODENOSCOPY  12-05-2014   IR IMAGING GUIDED PORT INSERTION  02/07/2018   IR NEPHROSTOMY PLACEMENT LEFT  12/25/2021   IR REMOVAL TUN ACCESS W/ PORT W/O FL MOD SED  04/14/2018   TRANSTHORACIC ECHOCARDIOGRAM  01-31-2008   nomral LVF,  ef 55-65%/  mild pulmonic stenosis without regurg.,  peak transpulomonic valve gradient   TRANSURETHRAL RESECTION OF BLADDER TUMOR N/A 12/31/2014   Procedure: TRANSURETHRAL RESECTION OF BLADDER TUMOR (TURBT);  Surgeon: Su Grand, MD;  Location: [facility];  Service: Urology;  Laterality: N/A;   TRANSURETHRAL RESECTION OF BLADDER TUMOR N/A 03/21/2017   Procedure: POSSIBLE TRANSURETHRAL RESECTION OF BLADDER TUMOR (TURBT);  Surgeon: Hildred Laser, MD;  Location: [facility];  Service: Urology;  Laterality: N/A;   Patient Active Problem List   Diagnosis Date Noted   Acute hypoxemic respiratory failure (HCC) 12/23/2022   CAP (community acquired pneumonia) 12/20/2022   Chronic idiopathic urticaria 10/14/2022   Rash 10/14/2022   Venous stasis 10/14/2022   Generalized weakness 06/21/2022   Pleural effusion 06/21/2022   Paroxysmal atrial fibrillation (HCC) 04/14/2022   C. difficile diarrhea 12/26/2021   Pyelonephritis 12/23/2021   Metastatic  cancer to intrathoracic lymph nodes (HCC) 10/30/2021   Sepsis secondary to urinary source 07/13/2020   Pseudomonas aeruginosa infection 06/18/2020   GERD (gastroesophageal reflux disease)    Sinus tachycardia    Fever    Dehydration    Rigors    Severe sepsis (HCC)    Elevated lactic acid level    Bladder cancer metastasized to lung (HCC) 03/31/2020   Adverse drug reaction 06/05/2018   Bacteremia associated with intravascular line (HCC) 04/20/2018   Febrile neutropenia (HCC) 04/13/2018   Gram positive sepsis (HCC) 04/13/2018   SIRS (systemic inflammatory  response syndrome) (HCC) 04/12/2018   Bacteremia due to Gram-positive bacteria 04/12/2018   Pancytopenia (HCC) 04/12/2018   Status post chemotherapy    UTI (urinary tract infection) 04/11/2018   Port-A-Cath in place 02/20/2018   Encounter for antineoplastic chemotherapy 02/17/2018   Sepsis due to urinary tract infection (HCC) 01/25/2018   Sepsis secondary to UTI (HCC) 01/25/2018   Hyperglycemia    Goals of care, counseling/discussion 01/18/2018   Bone metastasis 12/29/2017   Status post ileal conduit due to bladder cancer 06/29/2017   Bladder cancer (HCC) 06/28/2017   Stage IV malignant neoplasm of urinary bladder /with mets to the bones and lungs 04/20/2017   COPD (chronic obstructive pulmonary disease) (HCC) 03/07/2008    PCP: Elfredia Nevins  REFERRING PROVIDER: Doreatha Massed, Judie Petit  REFERRING DIAG:  Diagnosis Description  Please refer to outpatient PT for strengthening/conditioning    THERAPY DIAG:  Muscle weakness Difficulty in walking  Rationale for Evaluation and Treatment: Rehabilitation  ONSET DATE: 12/20/22   SUBJECTIVE STATEMENT: Mr. Soy states that since his diagnosis he feels as if he runs out of breath and energy.  The pt has been using a walker/wheelchair  if he goes to any MD appointments.    PERTINENT HISTORY: Per hospitalist at discharge from hospital on 12/20/22, (pt hospitalized on 5/6)[name]  is an 82 y.o. male, with history of stage IV bladder cancer with metastasis to bones and lungs, s/p cystoprostatectomy, with lower quadrant ileal conduit formation, diverticulosis, atrial fibrillation with anticoagulation on apixaban, GERD who came to ED with generalized weakness, persistent shortness of breath and poor appetite.  Patient was seen in oncology clinic this morning where he underwent right sided thoracentesis ordered by oncologist Dr. Ellin Saba.  1.8 L fluid was removed from right pleural effusion.  Patient is scheduled to undergo left  thoracentesis tomorrow.  Recently completed course of Augmentin without any improvement.  He was admitted with acute hypoxemic respiratory failure-multifactorial from bilateral pleural effusion with associated multi focal pneumonia.  He underwent left-sided thoracentesis with 700 mL of clear yellow fluid drained 5/7. This was again exudative by light's criteria with negative gram stain, no culture growth, negative cytology PAIN:  Are you having pain? No  PRECAUTIONS: Fall  WEIGHT BEARING RESTRICTIONS: No  FALLS:  Has patient fallen in last 6 months? No  LIVING ENVIRONMENT: Lives with: lives with their family Lives in: House/apartment Stairs: Yes: Internal: 8 steps; Pt has a lift chair and External: 1 steps; none Has following equipment at home: Single point cane and Walker - 2 wheeled  OCCUPATION: retired  PLOF:  Pt energy level has been down for the past 6 months prior to this he was able to walk where he wanted.  PATIENT GOALS: walk better.   NEXT MD VISIT: July 9th   OBJECTIVE:   COGNITION: Overall cognitive status: Within functional limits for tasks assessed     LOWER EXTREMITY MMT:  MMT Right eval Left eval  Hip flexion 3 3  Hip extension    Hip abduction 3- 3  Hip adduction    Hip internal rotation    Hip external rotation    Knee flexion    Knee extension 4 5  Ankle dorsiflexion    Ankle plantarflexion    Ankle inversion    Ankle eversion     (Blank rows = not tested) FUNCTIONAL TESTS:  30 seconds chair stand test: using UE: 8 ; 6 is poor and 9 is below average 2 minute walk test: 226 ft pt SOB      TODAY'S TREATMENT:                                                                                                                              DATE: 02/09/23:  Eval Diaphragmatic breathing LAQ Rt x 10 Sit to stand x 5     PATIENT EDUCATION:  Education details: HEP Person educated: Patient and Spouse Education method: Explanation Education  comprehension: returned demonstration  HOME EXERCISE PROGRAM: Access Code: [email or web address] URL: [email or web address] Date: 02/09/2023 Prepared by: Virgina Organ  Exercises - Seated Long Arc Quad  - 3 x daily - 7 x weekly - 1 sets - 10 reps - 5" hold - Sit to Stand with Counter Support  - 1 x daily - 7 x weekly - 1 sets - 10 reps - Seated Diaphragmatic Breathing  - 1 x daily - 7 x weekly - 1 sets - 10 reps  ASSESSMENT:  CLINICAL IMPRESSION: Patient is a 82 y.o. male who was seen today for physical therapy evaluation and treatment for muscle weakness and decreased activity tolerance.  Evaluation demonstrates decreased activity tolerance, decreased strength, decreased balance and decreased mobility.  Mr. Soja will benefit from skilled PT to address these issues and improve his functional ability.   OBJECTIVE IMPAIRMENTS: decreased activity tolerance, decreased balance, and decreased strength.   ACTIVITY LIMITATIONS: carrying, lifting, standing, squatting, stairs, and locomotion level  PARTICIPATION LIMITATIONS: cleaning, shopping, community activity, and yard work  PERSONAL FACTORS: Fitness and 1-2 comorbidities: active cancer, COPD  are also affecting patient's functional outcome.   REHAB POTENTIAL: Fair    CLINICAL DECISION MAKING: Evolving/moderate complexity  EVALUATION COMPLEXITY: Moderate   GOALS: Goals reviewed with patient? No  SHORT TERM GOALS: Target date: 03/09/23 PT to be I in HEP in order to be able to walk for 325 ft without being SOB Baseline: Goal status: INITIAL  LONG TERM GOALS: Target date: 04/06/23  PT to be I in advanced HEP in order to be able to walk 500 ft to be able to walk to  MD visits. Baseline:  Goal status: INITIAL  2.  PT LE strength to improve to be able to complete 12 sit to stand in 30 seconds. For decreased risk of falls  Baseline:  Goal status: INITIAL   PLAN:  PT FREQUENCY: 1x/week  PT DURATION: 8 weeks  PLANNED  INTERVENTIONS: Therapeutic exercises, Therapeutic activity, Balance training, Gait training, Patient/Family education, and Self Care  PLAN FOR NEXT SESSION: check pt 2 minute walk test each visit.  Advanced HEP but not to quickly as pt has low activity tolerance .  PT will be coming in one time a week only.  Virgina Organ, PT CLT [phone] 02/09/2023, 5:40 PM

## 2023-02-14 ENCOUNTER — Ambulatory Visit (HOSPITAL_COMMUNITY): Payer: Medicare Other | Attending: Hematology | Admitting: Physical Therapy

## 2023-02-14 DIAGNOSIS — M6281 Muscle weakness (generalized): Secondary | ICD-10-CM | POA: Diagnosis not present

## 2023-02-14 DIAGNOSIS — R262 Difficulty in walking, not elsewhere classified: Secondary | ICD-10-CM | POA: Diagnosis not present

## 2023-02-14 NOTE — Therapy (Signed)
OUTPATIENT PHYSICAL THERAPY TREATMENT   Patient Name: [name] MRN: [id number] DOB:[birth year], 82 y.o., male Today's Date: 02/14/2023  END OF SESSION:  PT End of Session - 02/14/23 1602     Visit Number 2    Number of Visits 8    Date for PT Re-Evaluation 04/06/23    Authorization Type medicare A    Progress Note Due on Visit 10    PT Start Time 1606    PT Stop Time 1645    PT Time Calculation (min) 39 min    Activity Tolerance Patient tolerated treatment well             Past Medical History:  Diagnosis Date   Bladder cancer (HCC)    Bone cancer (HCC)    BPH (benign prostatic hypertrophy)    Dysuria    GERD (gastroesophageal reflux disease)    History of adenomatous polyp of colon    tubular adenoma 06/ 2018   History of bladder cancer 12/2014   urologist-  dr Sherryl Barters--  dx High Grade TCC without stromal invasion s/p TURBT and intravesical BCG tx/  recurrent 07/2015 (pTa) TCC  repear intravesical BCG tx    History of hiatal hernia    History of urethral stricture    Nocturia    Past Surgical History:  Procedure Laterality Date   CARDIAC CATHETERIZATION  1997   normal coronary arteries   CARDIOVASCULAR STRESS TEST  12-14-2005   Low risk perfusion study/  very small scar in the inferior septum from mid ventricle to apex,  no ischemia/  mild inferior septal hypokinesis/  ef 58%   CATARACT EXTRACTION W/ INTRAOCULAR LENS  IMPLANT, BILATERAL  2011   COLONOSCOPY  last one 06/ 2018   CYSTOSCOPY WITH BIOPSY N/A 08/12/2015   Procedure: CYSTOSCOPY WITH BIOPSY AND FULGERATION;  Surgeon: Hildred Laser, MD;  Location: [facility];  Service: Urology;  Laterality: N/A;   CYSTOSCOPY WITH INJECTION N/A 06/29/2017   Procedure: CYSTOSCOPY WITH INJECTION OF INDOCYANINE GREEN DYE;  Surgeon: Sebastian Ache, MD;  Location: [facility];  Service: Urology;  Laterality: N/A;   CYSTOSCOPY WITH URETHRAL DILATATION N/A 03/21/2017   Procedure: CYSTOSCOPY;  Surgeon: Hildred Laser, MD;  Location: [facility];  Service: Urology;  Laterality: N/A;   ESOPHAGOGASTRODUODENOSCOPY  12-05-2014   IR IMAGING GUIDED PORT INSERTION  02/07/2018   IR NEPHROSTOMY PLACEMENT LEFT  12/25/2021   IR REMOVAL TUN ACCESS W/ PORT W/O FL MOD SED  04/14/2018   TRANSTHORACIC ECHOCARDIOGRAM  01-31-2008   nomral LVF,  ef 55-65%/  mild pulmonic stenosis without regurg.,  peak transpulomonic valve gradient   TRANSURETHRAL RESECTION OF BLADDER TUMOR N/A 12/31/2014   Procedure: TRANSURETHRAL RESECTION OF BLADDER TUMOR (TURBT);  Surgeon: Su Grand, MD;  Location: [facility];  Service: Urology;  Laterality: N/A;   TRANSURETHRAL RESECTION OF BLADDER TUMOR N/A 03/21/2017   Procedure: POSSIBLE TRANSURETHRAL RESECTION OF BLADDER TUMOR (TURBT);  Surgeon: Hildred Laser, MD;  Location: [facility];  Service: Urology;  Laterality: N/A;   Patient Active Problem List   Diagnosis Date Noted   Acute hypoxemic respiratory failure (HCC) 12/23/2022   CAP (community acquired pneumonia) 12/20/2022   Chronic idiopathic urticaria 10/14/2022   Rash 10/14/2022   Venous stasis 10/14/2022   Generalized weakness 06/21/2022   Pleural effusion 06/21/2022   Paroxysmal atrial fibrillation (HCC) 04/14/2022   C. difficile diarrhea 12/26/2021   Pyelonephritis 12/23/2021   Metastatic cancer to  intrathoracic lymph nodes (HCC) 10/30/2021   Sepsis secondary to urinary source 07/13/2020   Pseudomonas aeruginosa infection 06/18/2020   GERD (gastroesophageal reflux disease)    Sinus tachycardia    Fever    Dehydration    Rigors    Severe sepsis (HCC)    Elevated lactic acid level    Bladder cancer metastasized to lung (HCC) 03/31/2020   Adverse drug reaction 06/05/2018   Bacteremia associated with intravascular line (HCC) 04/20/2018   Febrile neutropenia (HCC) 04/13/2018   Gram positive sepsis (HCC) 04/13/2018   SIRS (systemic inflammatory response syndrome)  (HCC) 04/12/2018   Bacteremia due to Gram-positive bacteria 04/12/2018   Pancytopenia (HCC) 04/12/2018   Status post chemotherapy    UTI (urinary tract infection) 04/11/2018   Port-A-Cath in place 02/20/2018   Encounter for antineoplastic chemotherapy 02/17/2018   Sepsis due to urinary tract infection (HCC) 01/25/2018   Sepsis secondary to UTI (HCC) 01/25/2018   Hyperglycemia    Goals of care, counseling/discussion 01/18/2018   Bone metastasis 12/29/2017   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  Reports compliance with HEP given.  No pain just weakness.  Sates they are getting pulmonary rehab also.   Evaluation: [name] states that since his diagnosis he feels as if he runs out of breath and energy.  The pt has been using a walker/wheelchair  if he goes to any MD appointments.    PERTINENT HISTORY: Per hospitalist at discharge from hospital on 12/20/22, (pt hospitalized on 5/6)[name]  is an 82 y.o. male, with history of stage IV bladder cancer with metastasis to bones and lungs, s/p cystoprostatectomy, with lower quadrant ileal conduit formation, diverticulosis, atrial fibrillation with anticoagulation on apixaban, GERD who came to ED with generalized weakness, persistent shortness of breath and poor appetite.  Patient was seen in oncology clinic this morning where he underwent right sided thoracentesis ordered by  oncologist Dr. Ellin Saba.  1.8 L fluid was removed from right pleural effusion.  Patient is scheduled to undergo left thoracentesis tomorrow.  Recently completed course of Augmentin without any improvement.  He was admitted with acute hypoxemic respiratory failure-multifactorial from bilateral pleural effusion with associated multi focal pneumonia.  He underwent left-sided thoracentesis with 700 mL of clear yellow fluid drained 5/7. This was again exudative by light's criteria with negative gram stain, no culture growth, negative cytology PAIN:  Are you having pain? No  PRECAUTIONS: Fall  WEIGHT BEARING RESTRICTIONS: No  FALLS:  Has patient fallen in last 6 months? No  LIVING ENVIRONMENT: Lives with: lives with their family Lives in: House/apartment Stairs: Yes: Internal: 8 steps; Pt has a lift chair and External: 1 steps; none Has following equipment at home: Single point cane and Walker - 2 wheeled  OCCUPATION: retired  PLOF:  Pt energy level has been down for the past 6 months prior to this he was able to walk where he wanted.  PATIENT GOALS: walk better.   NEXT MD VISIT: July  9th   OBJECTIVE:   COGNITION: Overall cognitive status: Within functional limits for tasks assessed     LOWER EXTREMITY MMT:  MMT Right eval Left eval  Hip flexion 3 3  Hip extension    Hip abduction 3- 3  Hip adduction    Hip internal rotation    Hip external rotation    Knee flexion    Knee extension 4 5  Ankle dorsiflexion    Ankle plantarflexion    Ankle inversion    Ankle eversion     (Blank rows = not tested) FUNCTIONAL TESTS:  30 seconds chair stand test: using UE: 8 ; 6 is poor and 9 is below average 2 minute walk test: 226 ft pt SOB      TODAY'S TREATMENT:                                                                                                                              DATE:  02/14/23 Sit to stands 10X with bil UE  295 no AD; SOB Diaphragmatic  breathing Standing:  heelraises 10X  Hip abduction 10X  Hip extension 10X Supine:  Bridge 10X  SLR 10X each Sidelying hip abduction 10X each  02/09/23:  Eval Diaphragmatic breathing LAQ Rt x 10 Sit to stand x 5     PATIENT EDUCATION:  Education details: HEP Person educated: Patient and Spouse Education method: Explanation Education comprehension: returned demonstration  HOME EXERCISE PROGRAM: Access Code: [email or web address] URL: [email or web address]  Date: 02/14/2023 Prepared by: Emeline Gins Exercises - Supine Bridge  - 1 x daily - 7 x weekly - 1 sets - 10 reps - Small Range Straight Leg Raise  - 1 x daily - 7 x weekly - 1 sets - 10 reps - Sidelying Hip Abduction  - 1 x daily - 7 x weekly - 1 sets - 10 reps - Standing Heel Raise with Support  - 1 x daily - 7 x weekly - 1 sets - 10 reps - Standing Hip Abduction with Counter Support  - 1 x daily - 7 x weekly - 1 sets - 10 reps - Standing Hip Extension with Counter Support  - 1 x daily - 7 x weekly - 1 sets - 10 reps   Date: 02/09/2023 Prepared by: Virgina Organ Exercises - Seated Long Arc Quad  - 3 x daily - 7 x weekly - 1 sets - 10 reps - 5" hold - Sit to Stand with Counter Support  - 1 x daily - 7 x weekly - 1 sets - 10 reps - Seated Diaphragmatic Breathing  - 1 x daily - 7 x weekly - 1 sets - 10 reps  ASSESSMENT:  CLINICAL IMPRESSION: Reviewed goals/POC moving forward.  PT able to recall therex given and reports working on his diaphragmatic breathing.  Pt SOB with minimal exertion today.  Encouraged to get up and walk every hour at home and complete therex 2X day and could  break up half of the exercises for each session.  Pt and spouse understood instructions.  Updated HEP to include exercises given today to target weak mm.  PT requires cues to complete therex more slowly/controlled as tends to use momentum.  Pt will continue to benefit from skilled PT to address his deficits and improve his functional ability.    OBJECTIVE IMPAIRMENTS: decreased activity tolerance, decreased balance, and decreased strength.   ACTIVITY LIMITATIONS: carrying, lifting, standing, squatting, stairs, and locomotion level  PARTICIPATION LIMITATIONS: cleaning, shopping, community activity, and yard work  PERSONAL FACTORS: Fitness and 1-2 comorbidities: active cancer, COPD  are also affecting patient's functional outcome.   REHAB POTENTIAL: Fair    CLINICAL DECISION MAKING: Evolving/moderate complexity  EVALUATION COMPLEXITY: Moderate   GOALS: Goals reviewed with patient? No  SHORT TERM GOALS: Target date: 03/09/23 PT to be I in HEP in order to be able to walk for 325 ft without being SOB Baseline: Goal status: INITIAL  LONG TERM GOALS: Target date: 04/06/23  PT to be I in advanced HEP in order to be able to walk 500 ft to be able to walk to  MD visits. Baseline:  Goal status: INITIAL  2.  PT LE strength to improve to be able to complete 12 sit to stand in 30 seconds. For decreased risk of falls  Baseline:  Goal status: INITIAL   PLAN:  PT FREQUENCY: 1x/week  PT DURATION: 8 weeks  PLANNED INTERVENTIONS: Therapeutic exercises, Therapeutic activity, Balance training, Gait training, Patient/Family education, and Self Care  PLAN FOR NEXT SESSION: check pt 2 minute walk test each visit.  Advanced HEP but not to quickly as pt has low activity tolerance .  PT will be coming in one time a week only. *therapist tried to give pt/spouse appt week of July 8th but declined due to other visits scheduled that week-AF.     Lurena Nida, PTA/CLT [facility] Ph: [phone]  02/14/2023, 5:11 PM

## 2023-02-15 ENCOUNTER — Ambulatory Visit (HOSPITAL_COMMUNITY)
Admission: RE | Admit: 2023-02-15 | Discharge: 2023-02-15 | Disposition: A | Discharge status: Home / Self Care | Payer: Medicare Other | Source: Ambulatory Visit | Attending: Hematology | Admitting: Hematology

## 2023-02-15 DIAGNOSIS — J90 Pleural effusion, not elsewhere classified: Secondary | ICD-10-CM | POA: Diagnosis not present

## 2023-02-15 DIAGNOSIS — C771 Secondary and unspecified malignant neoplasm of intrathoracic lymph nodes: Secondary | ICD-10-CM | POA: Diagnosis not present

## 2023-02-15 DIAGNOSIS — C679 Malignant neoplasm of bladder, unspecified: Secondary | ICD-10-CM | POA: Diagnosis not present

## 2023-02-15 DIAGNOSIS — R059 Cough, unspecified: Secondary | ICD-10-CM | POA: Diagnosis not present

## 2023-02-21 ENCOUNTER — Encounter: Payer: Self-pay | Admitting: Pulmonary Disease

## 2023-02-21 ENCOUNTER — Ambulatory Visit (INDEPENDENT_AMBULATORY_CARE_PROVIDER_SITE_OTHER): Payer: Medicare Other | Admitting: Pulmonary Disease

## 2023-02-21 VITALS — BP 102/65 | HR 75 | Ht 71.0 in | Wt 169.6 lb

## 2023-02-21 DIAGNOSIS — J4489 Other specified chronic obstructive pulmonary disease: Secondary | ICD-10-CM | POA: Diagnosis not present

## 2023-02-21 DIAGNOSIS — J90 Pleural effusion, not elsewhere classified: Secondary | ICD-10-CM | POA: Diagnosis not present

## 2023-02-21 NOTE — Patient Instructions (Signed)
Will arrange for appointment with interventional radiology to have fluid drained from around your right lung  Follow up in 3 months

## 2023-02-21 NOTE — Progress Notes (Signed)
[facility]  Chief Complaint  Patient presents with   Follow-up    Past Surgical History:  He  has a past surgical history that includes Colonoscopy (last one 06/ 2018); Esophagogastroduodenoscopy (12-05-2014); Cardiovascular stress test (12-14-2005); transthoracic echocardiogram (01-31-2008); Cardiac catheterization (1997); Cataract extraction w/ intraocular lens  implant, bilateral (2011); Transurethral resection of bladder tumor (N/A, 12/31/2014); Cystoscopy with biopsy (N/A, 08/12/2015); Cystoscopy with urethral dilatation (N/A, 03/21/2017); Transurethral resection of bladder tumor (N/A, 03/21/2017); Cystoscopy with injection (N/A, 06/29/2017); IR IMAGING GUIDED PORT INSERTION (02/07/2018); IR REMOVAL TUN ACCESS W/ PORT W/O FL MOD SED (04/14/2018); and IR NEPHROSTOMY PLACEMENT LEFT (12/25/2021).  Past Medical History:  Bladder cancer, BPH, GERD, Colon polyps, Hiatal hernia, Urethral stricture  Constitutional:  BP 102/65   Pulse 75   Ht 5\' 11"  (1.803 m)   Wt 169 lb 9.6 oz (76.9 kg)   SpO2 95% Comment: RA  BMI 23.65 kg/m   Brief Summary:  [name] is a 82 y.o. male with cough, dyspnea and pleural effusion.      Subjective:   He is here with his wife.    He had chest xray on 02/15/23.  Showed recurrence of Rt pleural effusion.  Not having fever, chest pain or hemoptysis.  Has cough with clear sputum.  Breathing has improved so he stopped using symbicort.  Physical Exam:   Appearance - well kempt   ENMT - no sinus tenderness, no oral exudate, no LAN, Mallampati 2 airway, no stridor  Respiratory - decreased BS Rt base, no wheeze  CV - s1s2 regular rate and rhythm, no murmurs  Ext - no clubbing, no edema  Skin - no rashes  Psych - normal mood and affect    Pulmonary testing:  Rt pleural fluid 06/21/22 >> protein 3.3, LDH 80, WBC 350 (85%L), cytology negative Rt pleural fluid 11/29/22 >> protein 3.2, LDH 75, WBC 465 (61%L),  cytology negative PFT 12/01/22 >> FEV1 1.21 (41%), FEV1% 57, TLC 5.12 (70%), DLCO 61% Lt pleural fluid 12/21/22 >> protein 3.3, WBC 2193 (94%L), cytology negative  Chest Imaging:  CT angio chest 12/20/22 >> mod Lt and small Rt effusions, patchy GGO in LUL, 11 x 7 mm nodule LUL, patchy opacities RUL, narrowing of Rt bronchi  Cardiac Tests:  Echo 12/23/22 >> EF 60 to 65%, grade 1 DD  Social History:  He  reports that he quit smoking about 34 years ago. His smoking use included cigarettes. He has a 34.00 pack-year smoking history. He has never used smokeless tobacco. He reports current alcohol use of about 6.0 standard drinks of alcohol per week. He reports that he does not use drugs.  Family History:  His family history includes Alcoholism in his father.     Assessment/Plan:   COPD with chronic bronchitis. - he had trouble with previous trial of trelegy due to throat irritation - advised him to resume using symbicort 160 two puffs bid - prn albuterol  Recurrent right pleural effusions. - will arrange for IR to do Rt thoracentesis - will ask to have fluid sent for glucose, protein, LDH, cell count, gram stain and culture, and cytology  Chronic idiopathic urticaria. - followed by allergy/asthma - on xolair  Paroxysmal atrial fibrillation. - followed by Dr. Lewayne Bunting with cardiology  Metastatic urothelial carcinoma of bladder with lung and bone involvement. - followed by Dr. Ellin Saba with oncology  Time Spent Involved in Patient Care on Day of Examination:  37 minutes  Follow  up:   Patient Instructions  Will arrange for appointment with interventional radiology to have fluid drained from around your right lung  Follow up in 3 months  Medication List:   Allergies as of 02/21/2023       Reactions   Ciprofloxacin Hives   Zofran [ondansetron] Rash        Medication List        Accurate as of February 21, 2023  1:35 PM. If you have any questions, ask your nurse or  doctor.          STOP taking these medications    amoxicillin-clavulanate 875-125 MG tablet Commonly known as: AUGMENTIN Stopped by: Coralyn Helling, MD       TAKE these medications    acetaminophen 500 MG tablet Commonly known as: TYLENOL Take 1,000 mg by mouth every 6 (six) hours as needed for mild pain, fever, moderate pain or headache.   apixaban 5 MG Tabs tablet Commonly known as: ELIQUIS Take 1 tablet (5 mg total) by mouth 2 (two) times daily.   betamethasone dipropionate 0.05 % cream Apply topically 2 (two) times daily.   budesonide-formoterol 160-4.5 MCG/ACT inhaler Commonly known as: Symbicort Inhale 2 puffs into the lungs 2 (two) times daily.   chlorpheniramine-HYDROcodone 10-8 MG/5ML Commonly known as: TUSSIONEX Take 5 mLs by mouth every 12 (twelve) hours as needed (severe cough).   dextromethorphan-guaiFENesin 30-600 MG 12hr tablet Commonly known as: MUCINEX DM Take 1 tablet by mouth 2 (two) times daily.   hydrOXYzine 25 MG tablet Commonly known as: ATARAX Take 1 tablet (25 mg total) by mouth 3 (three) times daily as needed.   megestrol 400 MG/10ML suspension Commonly known as: MEGACE Take 10 mLs (400 mg total) by mouth 2 (two) times daily.   methenamine 1 g tablet Commonly known as: HIPREX Take 1 g by mouth 2 (two) times daily.   metoprolol tartrate 25 MG tablet Commonly known as: LOPRESSOR Take 1 tablet (25 mg total) by mouth 2 (two) times daily.   pantoprazole 40 MG tablet Commonly known as: PROTONIX Take 1 tablet (40 mg total) by mouth 2 (two) times daily.   predniSONE 10 MG tablet Commonly known as: DELTASONE Take 1 tablet (10 mg total) by mouth daily with breakfast.        Signature:  Coralyn Helling, MD [facility] Pulmonary/Critical Care Pager - [phone] 02/21/2023, 1:35 PM

## 2023-02-22 ENCOUNTER — Ambulatory Visit: Payer: Medicare Other | Admitting: Pulmonary Disease

## 2023-02-22 ENCOUNTER — Inpatient Hospital Stay: Payer: Medicare Other | Attending: Oncology

## 2023-02-22 ENCOUNTER — Inpatient Hospital Stay (HOSPITAL_BASED_OUTPATIENT_CLINIC_OR_DEPARTMENT_OTHER): Payer: Medicare Other | Admitting: Hematology

## 2023-02-22 ENCOUNTER — Other Ambulatory Visit: Payer: Self-pay | Admitting: *Deleted

## 2023-02-22 DIAGNOSIS — Z5112 Encounter for antineoplastic immunotherapy: Secondary | ICD-10-CM | POA: Insufficient documentation

## 2023-02-22 DIAGNOSIS — J90 Pleural effusion, not elsewhere classified: Secondary | ICD-10-CM | POA: Diagnosis not present

## 2023-02-22 DIAGNOSIS — C679 Malignant neoplasm of bladder, unspecified: Secondary | ICD-10-CM | POA: Diagnosis not present

## 2023-02-22 DIAGNOSIS — Z87891 Personal history of nicotine dependence: Secondary | ICD-10-CM | POA: Insufficient documentation

## 2023-02-22 DIAGNOSIS — Z79620 Long term (current) use of immunosuppressive biologic: Secondary | ICD-10-CM | POA: Diagnosis not present

## 2023-02-22 DIAGNOSIS — C7802 Secondary malignant neoplasm of left lung: Secondary | ICD-10-CM | POA: Diagnosis not present

## 2023-02-22 DIAGNOSIS — C7801 Secondary malignant neoplasm of right lung: Secondary | ICD-10-CM | POA: Insufficient documentation

## 2023-02-22 DIAGNOSIS — C7951 Secondary malignant neoplasm of bone: Secondary | ICD-10-CM | POA: Diagnosis not present

## 2023-02-22 DIAGNOSIS — R0602 Shortness of breath: Secondary | ICD-10-CM | POA: Insufficient documentation

## 2023-02-22 LAB — CBC WITH DIFFERENTIAL/PLATELET
Abs Immature Granulocytes: 0.1 10*3/uL — ABNORMAL HIGH (ref 0.00–0.07)
Basophils Absolute: 0.1 10*3/uL (ref 0.0–0.1)
Basophils Relative: 1 %
Eosinophils Absolute: 0.2 10*3/uL (ref 0.0–0.5)
Eosinophils Relative: 2 %
HCT: 45.5 % (ref 39.0–52.0)
Hemoglobin: 14.8 g/dL (ref 13.0–17.0)
Immature Granulocytes: 1 %
Lymphocytes Relative: 6 %
Lymphs Abs: 0.9 10*3/uL (ref 0.7–4.0)
MCH: 35.6 pg — ABNORMAL HIGH (ref 26.0–34.0)
MCHC: 32.5 g/dL (ref 30.0–36.0)
MCV: 109.4 fL — ABNORMAL HIGH (ref 80.0–100.0)
Monocytes Absolute: 1.5 10*3/uL — ABNORMAL HIGH (ref 0.1–1.0)
Monocytes Relative: 10 %
Neutro Abs: 11.8 10*3/uL — ABNORMAL HIGH (ref 1.7–7.7)
Neutrophils Relative %: 80 %
Platelets: 210 10*3/uL (ref 150–400)
RBC: 4.16 MIL/uL — ABNORMAL LOW (ref 4.22–5.81)
RDW: 13.9 % (ref 11.5–15.5)
WBC: 14.8 10*3/uL — ABNORMAL HIGH (ref 4.0–10.5)
nRBC: 0 % (ref 0.0–0.2)

## 2023-02-22 LAB — COMPREHENSIVE METABOLIC PANEL
ALT: 37 U/L (ref 0–44)
AST: 20 U/L (ref 15–41)
Albumin: 2.9 g/dL — ABNORMAL LOW (ref 3.5–5.0)
Alkaline Phosphatase: 49 U/L (ref 38–126)
Anion gap: 10 (ref 5–15)
BUN: 18 mg/dL (ref 8–23)
CO2: 20 mmol/L — ABNORMAL LOW (ref 22–32)
Calcium: 8.4 mg/dL — ABNORMAL LOW (ref 8.9–10.3)
Chloride: 108 mmol/L (ref 98–111)
Creatinine, Ser: 0.94 mg/dL (ref 0.61–1.24)
GFR, Estimated: >60 mL/min (ref >60)
Glucose, Bld: 113 mg/dL — ABNORMAL HIGH (ref 70–99)
Potassium: 3.7 mmol/L (ref 3.5–5.1)
Sodium: 138 mmol/L (ref 135–145)
Total Bilirubin: 0.9 mg/dL (ref 0.3–1.2)
Total Protein: 5.9 g/dL — ABNORMAL LOW (ref 6.5–8.1)

## 2023-02-22 LAB — MAGNESIUM: Magnesium: 2.2 mg/dL (ref 1.7–2.4)

## 2023-02-22 LAB — TSH: TSH: 3.324 u[IU]/mL (ref 0.350–4.500)

## 2023-02-22 MED ORDER — METHYLPHENIDATE HCL 5 MG PO TABS: 5.0000 mg | ORAL_TABLET | Freq: Every day | ORAL | 0 refills | Status: DC | PRN

## 2023-02-22 NOTE — Progress Notes (Signed)
Patient has been examined by Dr. Ellin Saba. Vital signs and labs have been reviewed by MD - ANC, Creatinine, LFTs, hemoglobin, and platelets are within treatment parameters per M.D. - pt may proceed with treatment tomorrow, 02/23/23. Primary RN and pharmacy notified.

## 2023-02-22 NOTE — Progress Notes (Signed)
[facility] [address] [place] [address]    Clinic Day:  02/22/2023  Referring physician: Elfredia Nevins, MD  Patient Care Team: Elfredia Nevins, MD as PCP - General (Internal Medicine) Marinus Maw, MD as PCP - Electrophysiology (Cardiology) Doreatha Massed, MD as Medical Oncologist (Medical Oncology) Therese Sarah, RN as Oncology Nurse Navigator (Medical Oncology)   ASSESSMENT & PLAN:   Assessment: 1.  Metastatic urothelial carcinoma of the bladder to the lungs and bones: - Neoadjuvant chemotherapy with gemcitabine and cisplatin cycle 1 on 05/02/2017 - Cystoprostatectomy, bilateral lymphadenectomy by Dr. Berneice Heinrich on 06/29/2017, pathology T3b N0, 0/13 lymph nodes - Developed metastatic disease in April 2019, biopsy of the right ischium on 12/08/2017 with poorly differentiated carcinoma consistent with metastatic urothelial carcinoma. - XRT to the pelvis completed on 01/16/2018 - Carboplatin and gemcitabine 6 cycles from 01/30/2018 through 05/22/2018 - Pembrolizumab every 3 weeks started on 06/21/2018, discontinued in April 2020 due to skin toxicity - XRT to the right paratracheal lymph node, 50 Gray in 10 fractions completed on 04/23/2020 - XRT to the intrathoracic lymph node completed on 11/27/2021  - 12/20/2022: Right thoracentesis with 1.8 L removed - 12/21/2022: Left thoracentesis with 700 mL fluid removed, cytology negative. - Unclear etiology of recurrent effusions, exudative. - 2D echocardiogram: Normal LVEF with grade 1 diastolic dysfunction. - CT angiogram 12/20/2022: Negative for PE.  Multifocal parenchymal opacities.  No adenopathy. - Keytruda started on 01/07/2023.   2.  Social/family history: - Lives at home with his wife. - Worked for [employer].  Quit smoking 40 years ago. - No family history of malignancies.    Plan: 1.  Metastatic urothelial carcinoma of the bladder to the lungs and bones: - He denies any immunotherapy  related side effects. - Reviewed chest x-ray from 02/15/2023: Moderate right-sided pleural effusion. - We discussed option of thoracentesis.  He reports that his symptoms have not changed much.  Hence recommend observation at this time. - Reviewed labs today: Normal LFTs with albumin 2.9.  CBC with elevated white count from prednisone.  TSH is normal at 3.3. - He will proceed with cycle 3 tomorrow. - I will give him a trial of Ritalin 5 mg in the mornings as needed for fatigue. - RTC 3 weeks for follow-up.  Will consider imaging after cycle 4.   2.  Dry cough: - Cough also improved.  Use Robitussin DM as needed.   3.  Loss of appetite/weight loss: - He is taking Megace intermittently.  He is eating better and gained 2 pounds.  4.  Immunotherapy induced skin rash: - Rash and itching has improved after the start of prednisone. - Continue prednisone 10 mg daily.    No orders of the defined types were placed in this encounter.     Alben Deeds Teague,acting as a Neurosurgeon for Doreatha Massed, MD.,have documented all relevant documentation on the behalf of Doreatha Massed, MD,as directed by  Doreatha Massed, MD while in the presence of Doreatha Massed, MD.  I, Doreatha Massed MD, have reviewed the above documentation for accuracy and completeness, and I agree with the above.    Doreatha Massed, MD   7/9/20245:18 PM  CHIEF COMPLAINT:   Diagnosis: Metastatic urothelial carcinoma of the bladder    Cancer Staging  No matching staging information was found for the patient.   Prior Therapy: 1. Neoadjuvant gemcitabine/cisplatin on 05/02/17 (one cycle) 2. Cystoprostatectomy and bilateral lymphadenectomy on 06/29/2017 (Dr. Berneice Heinrich) 3. Radiation therapy to  the pelvic lesion 01/02/18 - 01/16/18 4. Carboplatin and gemcitabine, 6 cycles 02/06/18 - 05/22/18 5. Pembrolizumab 06/21/18 - 12/13/18 6. Radiation therapy to RUL and right paratracheal nodes 04/09/20 - 04/23/20 7. Radiation  therapy to intrathoracic lymph nodes 11/16/21 - 11/27/21  Current Therapy:  pembrolizumab    HISTORY OF PRESENT ILLNESS:   Oncology History  Stage IV malignant neoplasm of urinary bladder /with mets to the bones and lungs  04/20/2017 Initial Diagnosis   Malignant neoplasm of urinary bladder (HCC)   01/30/2018 - 05/22/2018 Chemotherapy   The patient had palonosetron (ALOXI) injection 0.25 mg, 0.25 mg, Intravenous,  Once, 6 of 6 cycles Administration: 0.25 mg (01/30/2018), 0.25 mg (02/20/2018), 0.25 mg (03/13/2018), 0.25 mg (04/03/2018), 0.25 mg (05/01/2018), 0.25 mg (05/22/2018) CARBOplatin (PARAPLATIN) 400 mg in sodium chloride 0.9 % 250 mL chemo infusion, 400 mg (100 % of original dose 400 mg), Intravenous,  Once, 6 of 6 cycles Dose modification: 400 mg (original dose 400 mg, Cycle 1),   (original dose 400 mg, Cycle 2, Reason: Patient Age), 410 mg (original dose 400 mg, Cycle 6) Administration: 400 mg (01/30/2018), 400 mg (02/20/2018), 400 mg (03/13/2018), 410 mg (04/03/2018), 410 mg (05/01/2018), 410 mg (05/22/2018) gemcitabine (GEMZAR) 2,128 mg in sodium chloride 0.9 % 250 mL chemo infusion, 1,000 mg/m2 = 2,128 mg, Intravenous,  Once, 6 of 6 cycles Dose modification: 800 mg/m2 (original dose 1,000 mg/m2, Cycle 4, Reason: Provider Judgment), 800 mg/m2 (original dose 1,000 mg/m2, Cycle 4, Reason: Provider Judgment) Administration: 2,128 mg (01/30/2018), 2,128 mg (02/06/2018), 2,128 mg (02/20/2018), 2,128 mg (02/27/2018), 2,128 mg (03/13/2018), 1,710 mg (04/03/2018), 1,710 mg (04/10/2018), 1,710 mg (05/01/2018), 1,710 mg (05/22/2018) fosaprepitant (EMEND) 150 mg, dexamethasone (DECADRON) 12 mg in sodium chloride 0.9 % 145 mL IVPB, , Intravenous,  Once, 6 of 6 cycles Administration:  (01/30/2018),  (02/20/2018),  (03/13/2018),  (04/03/2018),  (05/01/2018),  (05/22/2018)  for chemotherapy treatment.    01/07/2023 -  Chemotherapy   Patient is on Treatment Plan : BLADDER Pembrolizumab (200) q21d     Bladder cancer (HCC)   06/28/2017 Initial Diagnosis   Bladder cancer (HCC)   06/21/2018 - 12/13/2018 Chemotherapy   The patient had pembrolizumab (KEYTRUDA) 200 mg in sodium chloride 0.9 % 50 mL chemo infusion, 200 mg, Intravenous, Once, 8 of 9 cycles Administration: 200 mg (06/21/2018), 200 mg (07/12/2018), 200 mg (08/02/2018), 200 mg (08/23/2018), 200 mg (09/13/2018), 200 mg (10/04/2018), 200 mg (11/22/2018), 200 mg (12/13/2018)  for chemotherapy treatment.       INTERVAL HISTORY:   [name] is a 82 y.o. male presenting to clinic today for follow up of Metastatic urothelial carcinoma of the bladder. He was last seen by me on 02/01/23.  Since our last visit, the patient under went a DG Chest 2 View on 7/7 that showed moderate right-sided pleural effusion and prominence of main pulmonary artery which could indicate underlying pulmonic stenosis.  Today, he states that he is doing well overall. His appetite level is at 70%. His energy level is at 50%.  PAST MEDICAL HISTORY:   Past Medical History: Past Medical History:  Diagnosis Date   Bladder cancer (HCC)    Bone cancer (HCC)    BPH (benign prostatic hypertrophy)    Dysuria    GERD (gastroesophageal reflux disease)    History of adenomatous polyp of colon    tubular adenoma 06/ 2018   History of bladder cancer 12/2014   urologist-  dr Sherryl Barters--  dx High Grade TCC without stromal invasion s/p TURBT and  intravesical BCG tx/  recurrent 07/2015 (pTa) TCC  repear intravesical BCG tx    History of hiatal hernia    History of urethral stricture    Nocturia     Surgical History: Past Surgical History:  Procedure Laterality Date   CARDIAC CATHETERIZATION  1997   normal coronary arteries   CARDIOVASCULAR STRESS TEST  12-14-2005   Low risk perfusion study/  very small scar in the inferior septum from mid ventricle to apex,  no ischemia/  mild inferior septal hypokinesis/  ef 58%   CATARACT EXTRACTION W/ INTRAOCULAR LENS  IMPLANT, BILATERAL  2011   COLONOSCOPY  last  one 06/ 2018   CYSTOSCOPY WITH BIOPSY N/A 08/12/2015   Procedure: CYSTOSCOPY WITH BIOPSY AND FULGERATION;  Surgeon: Hildred Laser, MD;  Location: [facility];  Service: Urology;  Laterality: N/A;   CYSTOSCOPY WITH INJECTION N/A 06/29/2017   Procedure: CYSTOSCOPY WITH INJECTION OF INDOCYANINE GREEN DYE;  Surgeon: Sebastian Ache, MD;  Location: [facility];  Service: Urology;  Laterality: N/A;   CYSTOSCOPY WITH URETHRAL DILATATION N/A 03/21/2017   Procedure: CYSTOSCOPY;  Surgeon: Hildred Laser, MD;  Location: [facility];  Service: Urology;  Laterality: N/A;   ESOPHAGOGASTRODUODENOSCOPY  12-05-2014   IR IMAGING GUIDED PORT INSERTION  02/07/2018   IR NEPHROSTOMY PLACEMENT LEFT  12/25/2021   IR REMOVAL TUN ACCESS W/ PORT W/O FL MOD SED  04/14/2018   TRANSTHORACIC ECHOCARDIOGRAM  01-31-2008   nomral LVF,  ef 55-65%/  mild pulmonic stenosis without regurg.,  peak transpulomonic valve gradient   TRANSURETHRAL RESECTION OF BLADDER TUMOR N/A 12/31/2014   Procedure: TRANSURETHRAL RESECTION OF BLADDER TUMOR (TURBT);  Surgeon: Su Grand, MD;  Location: [facility];  Service: Urology;  Laterality: N/A;   TRANSURETHRAL RESECTION OF BLADDER TUMOR N/A 03/21/2017   Procedure: POSSIBLE TRANSURETHRAL RESECTION OF BLADDER TUMOR (TURBT);  Surgeon: Hildred Laser, MD;  Location: [facility];  Service: Urology;  Laterality: N/A;    Social History: Social History   Socioeconomic History   Marital status: Married    Spouse name: Not on file   Number of children: 1   Years of education: Not on file   Highest education level: Not on file  Occupational History   Occupation: Retired  Tobacco Use   Smoking status: Former    Packs/day: 1.00    Years: 34.00    Additional pack years: 0.00    Total pack years: 34.00    Types: Cigarettes    Quit date: 12/26/1988    Years since quitting: 34.1   Smokeless tobacco: Never  Vaping Use   Vaping  Use: Never used  Substance and Sexual Activity   Alcohol use: Yes    Alcohol/week: 6.0 standard drinks of alcohol    Types: 6 Cans of beer per week    Comment: BEER   Drug use: No   Sexual activity: Not Currently  Other Topics Concern   Not on file  Social History Narrative   Not on file   Social Determinants of Health   Financial Resource Strain: Not on file  Food Insecurity: No Food Insecurity (12/20/2022)   Hunger Vital Sign    Worried About Running Out of Food in the Last Year: Never true    Ran Out of Food in the Last Year: Never true  Transportation Needs: No Transportation Needs (12/20/2022)   PRAPARE - Transportation    Lack of Transportation (Medical): No    Lack  of Transportation (Non-Medical): No  Physical Activity: Not on file  Stress: Not on file  Social Connections: Not on file  Intimate Partner Violence: Not At Risk (12/20/2022)   Humiliation, Afraid, Rape, and Kick questionnaire    Fear of Current or Ex-Partner: No    Emotionally Abused: No    Physically Abused: No    Sexually Abused: No    Family History: Family History  Problem Relation Age of Onset   Alcoholism Father    Colon cancer Neg Hx    Colon polyps Neg Hx    Diabetes Neg Hx    Kidney disease Neg Hx    Esophageal cancer Neg Hx    Heart disease Neg Hx    Gallbladder disease Neg Hx    Allergic rhinitis Neg Hx    Angioedema Neg Hx    Asthma Neg Hx    Atopy Neg Hx    Eczema Neg Hx    Immunodeficiency Neg Hx    Urticaria Neg Hx     Current Medications:  Current Outpatient Medications:    acetaminophen (TYLENOL) 500 MG tablet, Take 1,000 mg by mouth every 6 (six) hours as needed for mild pain, fever, moderate pain or headache., Disp: , Rfl:    apixaban (ELIQUIS) 5 MG TABS tablet, Take 1 tablet (5 mg total) by mouth 2 (two) times daily., Disp: 60 tablet, Rfl: 2   betamethasone dipropionate 0.05 % cream, Apply topically 2 (two) times daily., Disp: 60 g, Rfl: 2   budesonide-formoterol  (SYMBICORT) 160-4.5 MCG/ACT inhaler, Inhale 2 puffs into the lungs 2 (two) times daily., Disp: 1 each, Rfl: 6   chlorpheniramine-HYDROcodone (TUSSIONEX) 10-8 MG/5ML, Take 5 mLs by mouth every 12 (twelve) hours as needed (severe cough)., Disp: 70 mL, Rfl: 0   dextromethorphan-guaiFENesin (MUCINEX DM) 30-600 MG 12hr tablet, Take 1 tablet by mouth 2 (two) times daily., Disp: , Rfl:    hydrOXYzine (ATARAX) 25 MG tablet, Take 1 tablet (25 mg total) by mouth 3 (three) times daily as needed., Disp: 90 tablet, Rfl: 1   megestrol (MEGACE) 400 MG/10ML suspension, Take 10 mLs (400 mg total) by mouth 2 (two) times daily., Disp: 480 mL, Rfl: 3   methenamine (HIPREX) 1 g tablet, Take 1 g by mouth 2 (two) times daily., Disp: , Rfl:    metoprolol tartrate (LOPRESSOR) 25 MG tablet, Take 1 tablet (25 mg total) by mouth 2 (two) times daily., Disp: 60 tablet, Rfl: 2   pantoprazole (PROTONIX) 40 MG tablet, Take 1 tablet (40 mg total) by mouth 2 (two) times daily., Disp: 60 tablet, Rfl: 0   predniSONE (DELTASONE) 10 MG tablet, Take 1 tablet (10 mg total) by mouth daily with breakfast., Disp: 30 tablet, Rfl: 4   methylphenidate (RITALIN) 5 MG tablet, Take 1 tablet (5 mg total) by mouth daily as needed., Disp: 30 tablet, Rfl: 0  Current Facility-Administered Medications:    omalizumab Geoffry Paradise) prefilled syringe 300 mg, 300 mg, Subcutaneous, Q14 Days, Alfonse Spruce, MD, 300 mg at 11/24/22 1610   Allergies: Allergies  Allergen Reactions   Ciprofloxacin Hives   Zofran [Ondansetron] Rash    REVIEW OF SYSTEMS:   Review of Systems  Constitutional:  Negative for chills, fatigue and fever.  HENT:   Negative for lump/mass, mouth sores, nosebleeds, sore throat and trouble swallowing.   Eyes:  Negative for eye problems.  Respiratory:  Positive for cough. Negative for shortness of breath.   Cardiovascular:  Negative for chest pain, leg swelling and palpitations.  Gastrointestinal:  Negative for abdominal pain,  constipation, diarrhea, nausea and vomiting.  Genitourinary:  Negative for bladder incontinence, difficulty urinating, dysuria, frequency, hematuria and nocturia.   Musculoskeletal:  Negative for arthralgias, back pain, flank pain, myalgias and neck pain.  Skin:  Negative for itching and rash.  Neurological:  Negative for dizziness, headaches and numbness.  Hematological:  Does not bruise/bleed easily.  Psychiatric/Behavioral:  Negative for depression, sleep disturbance and suicidal ideas. The patient is not nervous/anxious.   All other systems reviewed and are negative.    VITALS:   Blood pressure 109/75, pulse 90, temperature 98.3 F (36.8 C), resp. rate 18, weight 170 lb 4.8 oz (77.2 kg), SpO2 97 %.  Wt Readings from Last 3 Encounters:  02/22/23 170 lb 4.8 oz (77.2 kg)  02/21/23 169 lb 9.6 oz (76.9 kg)  02/01/23 168 lb 4.8 oz (76.3 kg)    Body mass index is 23.75 kg/m.  Performance status (ECOG): 1 - Symptomatic but completely ambulatory  PHYSICAL EXAM:   Physical Exam Vitals and nursing note reviewed. Exam conducted with a chaperone present.  Constitutional:      Appearance: Normal appearance.  Cardiovascular:     Rate and Rhythm: Normal rate and regular rhythm.     Pulses: Normal pulses.     Heart sounds: Normal heart sounds.  Pulmonary:     Effort: Pulmonary effort is normal.     Breath sounds: Normal breath sounds.     Comments: Decreased breath sounds at the right base. Abdominal:     Palpations: Abdomen is soft. There is no hepatomegaly, splenomegaly or mass.     Tenderness: There is no abdominal tenderness.  Musculoskeletal:     Right lower leg: No edema.     Left lower leg: No edema.  Lymphadenopathy:     Cervical: No cervical adenopathy.     Right cervical: No superficial, deep or posterior cervical adenopathy.    Left cervical: No superficial, deep or posterior cervical adenopathy.     Upper Body:     Right upper body: No supraclavicular or axillary  adenopathy.     Left upper body: No supraclavicular or axillary adenopathy.  Neurological:     General: No focal deficit present.     Mental Status: He is alert and oriented to person, place, and time.  Psychiatric:        Mood and Affect: Mood normal.        Behavior: Behavior normal.     LABS:      Latest Ref Rng & Units 02/22/2023   12:22 PM 02/01/2023   12:57 PM 01/06/2023   10:13 AM  CBC  WBC 4.0 - 10.5 K/uL 14.8  10.1  7.1   Hemoglobin 13.0 - 17.0 g/dL 16.1  09.6  04.5   Hematocrit 39.0 - 52.0 % 45.5  43.4  46.2   Platelets 150 - 400 K/uL 210  277  264       Latest Ref Rng & Units 02/22/2023   12:22 PM 02/01/2023   12:57 PM 01/06/2023   10:13 AM  CMP  Glucose 70 - 99 mg/dL 409  811  914   BUN 8 - 23 mg/dL 18  19  15    Creatinine 0.61 - 1.24 mg/dL 7.82  9.56  2.13   Sodium 135 - 145 mmol/L 138  137  139   Potassium 3.5 - 5.1 mmol/L 3.7  3.5  3.9   Chloride 98 - 111 mmol/L 108  107  104  CO2 22 - 32 mmol/L 20  19  24    Calcium 8.9 - 10.3 mg/dL 8.4  8.7  8.9   Total Protein 6.5 - 8.1 g/dL 5.9  6.5  6.0   Total Bilirubin 0.3 - 1.2 mg/dL 0.9  0.7  0.6   Alkaline Phos 38 - 126 U/L 49  55  61   AST 15 - 41 U/L 20  25  22    ALT 0 - 44 U/L 37  31  20      No results found for: "CEA1", "CEA" / No results found for: "CEA1", "CEA" No results found for: "PSA1" No results found for: "ZDG644" No results found for: "CAN125"  No results found for: "TOTALPROTELP", "ALBUMINELP", "A1GS", "A2GS", "BETS", "BETA2SER", "GAMS", "MSPIKE", "SPEI" Lab Results  Component Value Date   TIBC 249 03/20/2018   FERRITIN 759 (H) 03/20/2018   IRONPCTSAT 20 (L) 03/20/2018   Lab Results  Component Value Date   LDH 96 (L) 06/21/2022     STUDIES:   DG Chest 2 View  Result Date: 02/20/2023 CLINICAL DATA:  cough EXAM: CHEST - 2 VIEW COMPARISON:  12/21/2022 FINDINGS: Cardiac silhouette is unremarkable. There is prominence of main pulmonary artery segment which may indicate underlying pulmonic  stenosis. No pneumothorax identified. There is a moderate right-sided pleural effusion with some loculation along the fissures. The lungs are clear. The visualized skeletal structures are unremarkable. IMPRESSION: Moderate right-sided pleural effusion. Prominence of main pulmonary artery which could indicate underlying pulmonic stenosis. Electronically Signed   By: Layla Maw M.D.   On: 02/20/2023 15:02

## 2023-02-22 NOTE — Patient Instructions (Addendum)
[facility] at [facility] Discharge Instructions   You were seen and examined today by Dr. Ellin Saba.  He reviewed the results of your lab work which are normal/stable.   We will proceed with your treatment tomorrow.   Return as scheduled.    Thank you for choosing [facility] at [facility] to provide your oncology and hematology care.  To afford each patient quality time with our provider, please arrive at least 15 minutes before your scheduled appointment time.   If you have a lab appointment with the Cancer Center please come in thru the Main Entrance and check in at the main information desk.  You need to re-schedule your appointment should you arrive 10 or more minutes late.  We strive to give you quality time with our providers, and arriving late affects you and other patients whose appointments are after yours.  Also, if you no show three or more times for appointments you may be dismissed from the clinic at the providers discretion.     Again, thank you for choosing [facility].  Our hope is that these requests will decrease the amount of time that you wait before being seen by our physicians.       _____________________________________________________________  Should you have questions after your visit to [facility], please contact our office at [phone] and follow the prompts.  Our office hours are 8:00 a.m. and 4:30 p.m. Monday - Friday.  Please note that voicemails left after 4:00 p.m. may not be returned until the following business day.  We are closed weekends and major holidays.  You do have access to a nurse 24-7, just call the main number to the clinic [phone] and do not press any options, hold on the line and a nurse will answer the phone.    For prescription refill requests, have your pharmacy contact our office and allow 72 hours.    Due to Covid, you will need to wear a mask upon  entering the hospital. If you do not have a mask, a mask will be given to you at the Main Entrance upon arrival. For doctor visits, patients may have 1 support person age 15 or older with them. For treatment visits, patients can not have anyone with them due to social distancing guidelines and our immunocompromised population.

## 2023-02-23 ENCOUNTER — Inpatient Hospital Stay: Payer: Medicare Other

## 2023-02-23 VITALS — BP 96/59 | HR 67 | Temp 97.9°F | Resp 18

## 2023-02-23 DIAGNOSIS — C679 Malignant neoplasm of bladder, unspecified: Secondary | ICD-10-CM

## 2023-02-23 DIAGNOSIS — C7801 Secondary malignant neoplasm of right lung: Secondary | ICD-10-CM | POA: Diagnosis not present

## 2023-02-23 DIAGNOSIS — C7802 Secondary malignant neoplasm of left lung: Secondary | ICD-10-CM | POA: Diagnosis not present

## 2023-02-23 DIAGNOSIS — Z5112 Encounter for antineoplastic immunotherapy: Secondary | ICD-10-CM | POA: Diagnosis not present

## 2023-02-23 DIAGNOSIS — C7951 Secondary malignant neoplasm of bone: Secondary | ICD-10-CM | POA: Diagnosis not present

## 2023-02-23 DIAGNOSIS — Z87891 Personal history of nicotine dependence: Secondary | ICD-10-CM | POA: Diagnosis not present

## 2023-02-23 MED ORDER — SODIUM CHLORIDE 0.9 % IV SOLN: Freq: Once | INTRAVENOUS | Status: AC

## 2023-02-23 MED ORDER — SODIUM CHLORIDE 0.9 % IV SOLN
200.0000 mg | Freq: Once | INTRAVENOUS | Status: AC
  Administered 2023-02-23: 200 mg via INTRAVENOUS
  Filled 2023-02-23: qty 8

## 2023-02-23 NOTE — Progress Notes (Signed)
Patient presents today for Keytruda infusion.  Patient is in satisfactory condition with no new complaints voiced.  Vital signs are stable.  Labs from 02/22/2023 reviewed and all labs are within treatment parameters.  IV placed in right AC.  IV flushed well with good blood return noted. We will proceed with treatment per MD orders.    Patient tolerated treatment well with no complaints voiced.  Patient left via wheelchair with wife in stable condition.  Vital signs stable at discharge.  Follow up as scheduled.

## 2023-02-23 NOTE — Patient Instructions (Signed)
[facility]  Discharge Instructions: Thank you for choosing [facility] to provide your oncology and hematology care.  If you have a lab appointment with the Cancer Center - please note that after April 8th, 2024, all labs will be drawn in the cancer center.  You do not have to check in or register with the main entrance as you have in the past but will complete your check-in in the cancer center.  Wear comfortable clothing and clothing appropriate for easy access to any Portacath or PICC line.   We strive to give you quality time with your provider. You may need to reschedule your appointment if you arrive late (15 or more minutes).  Arriving late affects you and other patients whose appointments are after yours.  Also, if you miss three or more appointments without notifying the office, you may be dismissed from the clinic at the provider's discretion.      For prescription refill requests, have your pharmacy contact our office and allow 72 hours for refills to be completed.    Today you received the following chemotherapy and/or immunotherapy agents Keytruda. Pembrolizumab Injection What is this medication? PEMBROLIZUMAB (PEM broe LIZ ue mab) treats some types of cancer. It works by helping your immune system slow or stop the spread of cancer cells. It is a monoclonal antibody. This medicine may be used for other purposes; ask your health care provider or pharmacist if you have questions. COMMON BRAND NAME(S): Keytruda What should I tell my care team before I take this medication? They need to know if you have any of these conditions: Allogeneic stem cell transplant (uses someone else's stem cells) Autoimmune diseases, such as Crohn disease, ulcerative colitis, lupus History of chest radiation Nervous system problems, such as Guillain-Barre syndrome, myasthenia gravis Organ transplant An unusual or allergic reaction to pembrolizumab, other medications,  foods, dyes, or preservatives Pregnant or trying to get pregnant Breast-feeding How should I use this medication? This medication is injected into a vein. It is given by your care team in a hospital or clinic setting. A special MedGuide will be given to you before each treatment. Be sure to read this information carefully each time. Talk to your care team about the use of this medication in children. While it may be prescribed for children as young as 6 months for selected conditions, precautions do apply. Overdosage: If you think you have taken too much of this medicine contact a poison control center or emergency room at once. NOTE: This medicine is only for you. Do not share this medicine with others. What if I miss a dose? Keep appointments for follow-up doses. It is important not to miss your dose. Call your care team if you are unable to keep an appointment. What may interact with this medication? Interactions have not been studied. This list may not describe all possible interactions. Give your health care provider a list of all the medicines, herbs, non-prescription drugs, or dietary supplements you use. Also tell them if you smoke, drink alcohol, or use illegal drugs. Some items may interact with your medicine. What should I watch for while using this medication? Your condition will be monitored carefully while you are receiving this medication. You may need blood work while taking this medication. This medication may cause serious skin reactions. They can happen weeks to months after starting the medication. Contact your care team right away if you notice fevers or flu-like symptoms with a rash. The rash   may be red or purple and then turn into blisters or peeling of the skin. You may also notice a red rash with swelling of the face, lips, or lymph nodes in your neck or under your arms. Tell your care team right away if you have any change in your eyesight. Talk to your care team if you  may be pregnant. Serious birth defects can occur if you take this medication during pregnancy and for 4 months after the last dose. You will need a negative pregnancy test before starting this medication. Contraception is recommended while taking this medication and for 4 months after the last dose. Your care team can help you find the option that works for you. Do not breastfeed while taking this medication and for 4 months after the last dose. What side effects may I notice from receiving this medication? Side effects that you should report to your care team as soon as possible: Allergic reactions--skin rash, itching, hives, swelling of the face, lips, tongue, or throat Dry cough, shortness of breath or trouble breathing Eye pain, redness, irritation, or discharge with blurry or decreased vision Heart muscle inflammation--unusual weakness or fatigue, shortness of breath, chest pain, fast or irregular heartbeat, dizziness, swelling of the ankles, feet, or hands Hormone gland problems--headache, sensitivity to light, unusual weakness or fatigue, dizziness, fast or irregular heartbeat, increased sensitivity to cold or heat, excessive sweating, constipation, hair loss, increased thirst or amount of urine, tremors or shaking, irritability Infusion reactions--chest pain, shortness of breath or trouble breathing, feeling faint or lightheaded Kidney injury (glomerulonephritis)--decrease in the amount of urine, red or dark [name] urine, foamy or bubbly urine, swelling of the ankles, hands, or feet Liver injury--right upper belly pain, loss of appetite, nausea, light-colored stool, dark yellow or [name] urine, yellowing skin or eyes, unusual weakness or fatigue Pain, tingling, or numbness in the hands or feet, muscle weakness, change in vision, confusion or trouble speaking, loss of balance or coordination, trouble walking, seizures Rash, fever, and swollen lymph nodes Redness, blistering, peeling, or loosening  of the skin, including inside the mouth Sudden or severe stomach pain, bloody diarrhea, fever, nausea, vomiting Side effects that usually do not require medical attention (report to your care team if they continue or are bothersome): Bone, joint, or muscle pain Diarrhea Fatigue Loss of appetite Nausea Skin rash This list may not describe all possible side effects. Call your doctor for medical advice about side effects. You may report side effects to FDA at 1-800-FDA-1088. Where should I keep my medication? This medication is given in a hospital or clinic. It will not be stored at home. NOTE: This sheet is a summary. It may not cover all possible information. If you have questions about this medicine, talk to your doctor, pharmacist, or health care provider.  2024 Elsevier/Gold Standard (2021-12-15 00:00:00)       To help prevent nausea and vomiting after your treatment, we encourage you to take your nausea medication as directed.  BELOW ARE SYMPTOMS THAT SHOULD BE REPORTED IMMEDIATELY: *FEVER GREATER THAN 100.4 F (38 C) OR HIGHER *CHILLS OR SWEATING *NAUSEA AND VOMITING THAT IS NOT CONTROLLED WITH YOUR NAUSEA MEDICATION *UNUSUAL SHORTNESS OF BREATH *UNUSUAL BRUISING OR BLEEDING *URINARY PROBLEMS (pain or burning when urinating, or frequent urination) *BOWEL PROBLEMS (unusual diarrhea, constipation, pain near the anus) TENDERNESS IN MOUTH AND THROAT WITH OR WITHOUT PRESENCE OF ULCERS (sore throat, sores in mouth, or a toothache) UNUSUAL RASH, SWELLING OR PAIN  UNUSUAL VAGINAL DISCHARGE OR ITCHING     Items with * indicate a potential emergency and should be followed up as soon as possible or go to the Emergency Department if any problems should occur.  Please show the CHEMOTHERAPY ALERT CARD or IMMUNOTHERAPY ALERT CARD at check-in to the Emergency Department and triage nurse.  Should you have questions after your visit or need to cancel or reschedule your appointment, please contact  [facility] [phone]  and follow the prompts.  Office hours are 8:00 a.m. to 4:30 p.m. Monday - Friday. Please note that voicemails left after 4:00 p.m. may not be returned until the following business day.  We are closed weekends and major holidays. You have access to a nurse at all times for urgent questions. Please call the main number to the clinic [phone] and follow the prompts.  For any non-urgent questions, you may also contact your provider using MyChart. We now offer e-Visits for anyone 18 and older to request care online for non-urgent symptoms. For details visit [email or web address].   Also download the MyChart app! Go to the app store, search "MyChart", open the app, select [facility], and log in with your MyChart username and password.   

## 2023-02-24 ENCOUNTER — Encounter (HOSPITAL_COMMUNITY): Payer: Self-pay | Admitting: Physical Therapy

## 2023-02-24 ENCOUNTER — Other Ambulatory Visit (HOSPITAL_COMMUNITY): Payer: Medicare Other

## 2023-02-24 ENCOUNTER — Other Ambulatory Visit: Payer: Medicare Other

## 2023-03-01 ENCOUNTER — Ambulatory Visit (HOSPITAL_COMMUNITY): Payer: Medicare Other | Admitting: Physical Therapy

## 2023-03-01 DIAGNOSIS — M6281 Muscle weakness (generalized): Secondary | ICD-10-CM

## 2023-03-01 DIAGNOSIS — R262 Difficulty in walking, not elsewhere classified: Secondary | ICD-10-CM

## 2023-03-01 NOTE — Therapy (Signed)
OUTPATIENT PHYSICAL THERAPY TREATMENT   Patient Name: [name] MRN: [id number] DOB:Feb 04, 1941, 82 y.o., male Today's Date: 03/01/2023  END OF SESSION:  PT End of Session - 03/01/23 1515    Visit Number 3    Number of Visits 8    Date for PT Re-Evaluation 04/06/23    Authorization Type medicare A    Progress Note Due on Visit 10    PT Start Time 1433    PT Stop Time 1515    PT Time Calculation (min) 42 min    Activity Tolerance Patient tolerated treatment well             Past Medical History:  Diagnosis Date   Bladder cancer (HCC)    Bone cancer (HCC)    BPH (benign prostatic hypertrophy)    Dysuria    GERD (gastroesophageal reflux disease)    History of adenomatous polyp of colon    tubular adenoma 06/ 2018   History of bladder cancer 12/2014   urologist-  dr Sherryl Barters--  dx High Grade TCC without stromal invasion s/p TURBT and intravesical BCG tx/  recurrent 07/2015 (pTa) TCC  repear intravesical BCG tx    History of hiatal hernia    History of urethral stricture    Nocturia    Past Surgical History:  Procedure Laterality Date   CARDIAC CATHETERIZATION  1997   normal coronary arteries   CARDIOVASCULAR STRESS TEST  12-14-2005   Low risk perfusion study/  very small scar in the inferior septum from mid ventricle to apex,  no ischemia/  mild inferior septal hypokinesis/  ef 58%   CATARACT EXTRACTION W/ INTRAOCULAR LENS  IMPLANT, BILATERAL  2011   COLONOSCOPY  last one 06/ 2018   CYSTOSCOPY WITH BIOPSY N/A 08/12/2015   Procedure: CYSTOSCOPY WITH BIOPSY AND FULGERATION;  Surgeon: Hildred Laser, MD;  Location[facility];  Service: Urology;  Laterality: N/A;   CYSTOSCOPY WITH INJECTION N/A 06/29/2017   Procedure: CYSTOSCOPY WITH INJECTION OF INDOCYANINE GREEN DYE;  Surgeon: Sebastian Ache, MD;  Location: [facility];  Service: Urology;  Laterality: N/A;   CYSTOSCOPY WITH URETHRAL DILATATION N/A 03/21/2017   Procedure: CYSTOSCOPY;  Surgeon: Hildred Laser, MD;  Location: [facility];  Service: Urology;  Laterality: N/A;   ESOPHAGOGASTRODUODENOSCOPY  12-05-2014   IR IMAGING GUIDED PORT INSERTION  02/07/2018   IR NEPHROSTOMY PLACEMENT LEFT  12/25/2021   IR REMOVAL TUN ACCESS W/ PORT W/O FL MOD SED  04/14/2018   TRANSTHORACIC ECHOCARDIOGRAM  01-31-2008   nomral LVF,  ef 55-65%/  mild pulmonic stenosis without regurg.,  peak transpulomonic valve gradient   TRANSURETHRAL RESECTION OF BLADDER TUMOR N/A 12/31/2014   Procedure: TRANSURETHRAL RESECTION OF BLADDER TUMOR (TURBT);  Surgeon: Su Grand, MD;  Location: [facility];  Service: Urology;  Laterality: N/A;   TRANSURETHRAL RESECTION OF BLADDER TUMOR N/A 03/21/2017   Procedure: POSSIBLE TRANSURETHRAL RESECTION OF BLADDER TUMOR (TURBT);  Surgeon: Hildred Laser, MD;  Location: [facility];  Service: Urology;  Laterality: N/A;   Patient Active Problem List   Diagnosis Date Noted   Acute hypoxemic respiratory failure (HCC) 12/23/2022   CAP (community acquired pneumonia) 12/20/2022   Chronic idiopathic urticaria 10/14/2022   Rash 10/14/2022   Venous stasis 10/14/2022   Generalized weakness 06/21/2022   Pleural effusion 06/21/2022   Paroxysmal atrial fibrillation (HCC) 04/14/2022   C. 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) 03/31/2020   Adverse drug reaction 06/05/2018   Bacteremia associated with intravascular line (HCC) 04/20/2018   Febrile neutropenia (HCC) 04/13/2018   Gram positive sepsis (HCC) 04/13/2018   SIRS (systemic inflammatory response syndrome)  (HCC) 04/12/2018   Bacteremia due to Gram-positive bacteria 04/12/2018   Pancytopenia (HCC) 04/12/2018   Status post chemotherapy    UTI (urinary tract infection) 04/11/2018   Port-A-Cath in place 02/20/2018   Encounter for antineoplastic chemotherapy 02/17/2018   Sepsis due to urinary tract infection (HCC) 01/25/2018   Sepsis secondary to UTI (HCC) 01/25/2018   Hyperglycemia    Goals of care, counseling/discussion 01/18/2018   Bone metastasis 12/29/2017   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  Reports compliance with HEP given.  No pain just weakness.  Sates they are getting pulmonary rehab also.   Evaluation: [name] states that since his diagnosis he feels as if he runs out of breath and energy.  The pt has been using a walker/wheelchair  if he goes to any MD appointments.    PERTINENT HISTORY: Per hospitalist at discharge from hospital on 12/20/22, (pt hospitalized on 5/6)[name]  is an 82 y.o. male, with history of stage IV bladder cancer with metastasis to bones and lungs, s/p cystoprostatectomy, with lower quadrant ileal conduit formation, diverticulosis, atrial fibrillation with anticoagulation on apixaban, GERD who came to ED with generalized weakness, persistent shortness of breath and poor appetite.  Patient was seen in oncology clinic this morning where he underwent right sided thoracentesis ordered by  oncologist Dr. Ellin Saba.  1.8 L fluid was removed from right pleural effusion.  Patient is scheduled to undergo left thoracentesis tomorrow.  Recently completed course of Augmentin without any improvement.  He was admitted with acute hypoxemic respiratory failure-multifactorial from bilateral pleural effusion with associated multi focal pneumonia.  He underwent left-sided thoracentesis with 700 mL of clear yellow fluid drained 5/7. This was again exudative by light's criteria with negative gram stain, no culture growth, negative cytology PAIN:  Are you having pain? No  PRECAUTIONS: Fall  WEIGHT BEARING RESTRICTIONS: No  FALLS:  Has patient fallen in last 6 months? No  LIVING ENVIRONMENT: Lives with: lives with their family Lives in: House/apartment Stairs: Yes: Internal: 8 steps; Pt has a lift chair and External: 1 steps; none Has following equipment at home: Single point cane and Walker - 2 wheeled  OCCUPATION: retired  PLOF:  Pt energy level has been down for the past 6 months prior to this he was able to walk where he wanted.  PATIENT GOALS: walk better.   NEXT MD VISIT: July 9th  OBJECTIVE:   COGNITION: Overall cognitive status: Within functional limits for tasks assessed     LOWER EXTREMITY MMT:  MMT Right eval Left eval  Hip flexion 3 3  Hip extension    Hip abduction 3- 3  Hip adduction    Hip internal rotation    Hip external rotation    Knee flexion    Knee extension 4 5  Ankle dorsiflexion    Ankle plantarflexion    Ankle inversion    Ankle eversion     (Blank rows = not tested) FUNCTIONAL TESTS:  30 seconds chair stand test: using UE: 8 ; 6 is poor and 9 is below average 2 minute walk test: 226 ft pt SOB      TODAY'S TREATMENT:                                                                                                                              DATE:  03/01/23 Nustep level 2  Marching x 10  Sit to stand x 5 reps x 2 set  Heel raise x  10 Minisquat x 10   Hip extension x 10  Side stepping x 2 Reps  2 minute walk test 226 ft. Forward lunge x 5 B  02/14/23 Sit to stands 10X with bil UE  295 no AD; SOB Diaphragmatic breathing Standing:  heelraises 10X  Hip abduction 10X  Hip extension 10X Supine:  Bridge 10X  SLR 10X each Sidelying hip abduction 10X each  02/09/23:  Eval Diaphragmatic breathing LAQ Rt x 10 Sit to stand x 5     PATIENT EDUCATION:  Education details: HEP Person educated: Patient and Spouse Education method: Explanation Education comprehension: returned demonstration  HOME EXERCISE PROGRAM: Access Code: [email or web address] URL: [email or web address]  Date: 02/14/2023 Prepared by: Emeline Gins Exercises - Supine Bridge  - 1 x daily - 7 x weekly - 1 sets - 10 reps - Small Range Straight Leg Raise  - 1 x daily - 7 x weekly - 1 sets - 10 reps - Sidelying Hip Abduction  - 1 x daily - 7 x weekly - 1 sets - 10 reps - Standing Heel Raise with Support  - 1 x daily - 7 x weekly - 1 sets - 10 reps - Standing Hip Abduction with Counter Support  - 1 x daily - 7 x weekly - 1 sets - 10 reps - Standing Hip Extension with Counter Support  - 1 x daily - 7 x weekly - 1 sets - 10 reps   Date: 02/09/2023 Prepared by: Virgina Organ Exercises - Seated Long Arc Quad  - 3 x daily - 7 x weekly - 1 sets - 10 reps - 5" hold - Sit to Stand with Counter Support  - 1 x daily - 7 x weekly - 1 sets - 10 reps - Seated Diaphragmatic Breathing  - 1 x daily - 7 x weekly - 1 sets - 10 reps  ASSESSMENT:  CLINICAL IMPRESSION: Pt needed significant verbal cuing for proper technique of exercises.  Pt needed several rest breaks throughout treatment.   Pt 2 minute walk test with decreased distance compared to last week    Encouraged to get up and walk every hour at home and complete therex 2X day and could break up half of the exercises for each session.  Pt and spouse understood instructions.  No update of HEP.    Pt  will continue to benefit from skilled PT to address his deficits and improve his functional ability.   OBJECTIVE IMPAIRMENTS: decreased activity tolerance, decreased balance, and decreased strength.   ACTIVITY LIMITATIONS: carrying, lifting, standing, squatting, stairs, and locomotion level  PARTICIPATION LIMITATIONS: cleaning, shopping, community activity, and yard work  PERSONAL FACTORS: Fitness and 1-2 comorbidities: active cancer, COPD  are also affecting patient's functional outcome.   REHAB POTENTIAL: Fair    CLINICAL DECISION MAKING: Evolving/moderate complexity  EVALUATION COMPLEXITY: Moderate   GOALS: Goals reviewed with patient? No  SHORT TERM GOALS: Target date: 03/09/23 PT to be I in HEP in order to be able to walk for 325 ft without being SOB Baseline: Goal status: on-going  LONG TERM GOALS: Target date: 04/06/23  PT to be I in advanced HEP in order to be able to walk 500 ft to be able to walk to  MD visits. Baseline:  Goal status: on-going  2.  PT LE strength to improve to be able to complete 12 sit to stand in 30 seconds. For decreased risk of falls  Baseline:  Goal status: on-going   PLAN:  PT FREQUENCY: 1x/week  PT DURATION: 8 weeks  PLANNED INTERVENTIONS: Therapeutic exercises, Therapeutic activity, Balance training, Gait training, Patient/Family education, and Self Care  PLAN FOR NEXT SESSION: check pt 2 minute walk test each visit.  Advanced HEP but not to quickly as pt has low activity tolerance .  PT will be coming in one time a week only. *therapist tried to give pt/spouse appt week of July 8th but declined due to other visits scheduled that week-AF.     Virgina Organ, PT CLT [phone]  03/01/2023, 3:15 PM

## 2023-03-03 ENCOUNTER — Ambulatory Visit: Payer: Medicare Other | Admitting: Hematology

## 2023-03-08 ENCOUNTER — Ambulatory Visit (HOSPITAL_COMMUNITY): Payer: Medicare Other | Admitting: Physical Therapy

## 2023-03-08 ENCOUNTER — Encounter (HOSPITAL_COMMUNITY): Payer: Self-pay | Admitting: Physical Therapy

## 2023-03-08 DIAGNOSIS — M6281 Muscle weakness (generalized): Secondary | ICD-10-CM

## 2023-03-08 DIAGNOSIS — R262 Difficulty in walking, not elsewhere classified: Secondary | ICD-10-CM | POA: Diagnosis not present

## 2023-03-08 NOTE — Therapy (Signed)
OUTPATIENT PHYSICAL THERAPY TREATMENT   Patient Name: [name] MRN: [id number] DOB:08-Apr-1941, 82 y.o., male Today's Date: 03/08/2023  END OF SESSION:  PT End of Session - 03/01/23 1515    Visit Number 3    Number of Visits 8    Date for PT Re-Evaluation 04/06/23    Authorization Type medicare A    Progress Note Due on Visit 10    PT Start Time 1433    PT Stop Time 1515    PT Time Calculation (min) 42 min    Activity Tolerance Patient tolerated treatment well             Past Medical History:  Diagnosis Date   Bladder cancer (HCC)    Bone cancer (HCC)    BPH (benign prostatic hypertrophy)    Dysuria    GERD (gastroesophageal reflux disease)    History of adenomatous polyp of colon    tubular adenoma 06/ 2018   History of bladder cancer 12/2014   urologist-  dr [name]--  dx High Grade TCC without stromal invasion s/p TURBT and intravesical BCG tx/  recurrent 07/2015 (pTa) TCC  repear intravesical BCG tx    History of hiatal hernia    History of urethral stricture    Nocturia    Past Surgical History:  Procedure Laterality Date   CARDIAC CATHETERIZATION  1997   normal coronary arteries   CARDIOVASCULAR STRESS TEST  12-14-2005   Low risk perfusion study/  very small scar in the inferior septum from mid ventricle to apex,  no ischemia/  mild inferior septal hypokinesis/  ef 58%   CATARACT EXTRACTION W/ INTRAOCULAR LENS  IMPLANT, BILATERAL  2011   COLONOSCOPY  last one 06/ 2018   CYSTOSCOPY WITH BIOPSY N/A 08/12/2015   Procedure: CYSTOSCOPY WITH BIOPSY AND FULGERATION;  Surgeon: Lance Laser, MD;  Location: [facility];  Service: Urology;  Laterality: N/A;   CYSTOSCOPY WITH INJECTION N/A 06/29/2017   Procedure: CYSTOSCOPY WITH INJECTION OF INDOCYANINE GREEN DYE;  Surgeon: Lance Ache, MD;  Location: [facility];  Service: Urology;  Laterality: N/A;   CYSTOSCOPY WITH URETHRAL DILATATION N/A 03/21/2017   Procedure: CYSTOSCOPY;  Surgeon: Lance Laser, MD;  Location: [facility];  Service: Urology;  Laterality: N/A;   ESOPHAGOGASTRODUODENOSCOPY  12-05-2014   IR IMAGING GUIDED PORT INSERTION  02/07/2018   IR NEPHROSTOMY PLACEMENT LEFT  12/25/2021   IR REMOVAL TUN ACCESS W/ PORT W/O FL MOD SED  04/14/2018   TRANSTHORACIC ECHOCARDIOGRAM  01-31-2008   nomral LVF,  ef 55-65%/  mild pulmonic stenosis without regurg.,  peak transpulomonic valve gradient   TRANSURETHRAL RESECTION OF BLADDER TUMOR N/A 12/31/2014   Procedure: TRANSURETHRAL RESECTION OF BLADDER TUMOR (TURBT);  Surgeon: Lance Grand, MD;  Location: [facility];  Service: Urology;  Laterality: N/A;   TRANSURETHRAL RESECTION OF BLADDER TUMOR N/A 03/21/2017   Procedure: POSSIBLE TRANSURETHRAL RESECTION OF BLADDER TUMOR (TURBT);  Surgeon: Lance Laser, MD;  Location: [facility];  Service: Urology;  Laterality: N/A;   Patient Active Problem List   Diagnosis Date Noted   Acute hypoxemic respiratory failure (HCC) 12/23/2022   CAP (community acquired pneumonia) 12/20/2022   Chronic idiopathic urticaria 10/14/2022   Rash 10/14/2022   Venous stasis 10/14/2022   Generalized weakness 06/21/2022   Pleural effusion 06/21/2022   Paroxysmal atrial fibrillation (HCC) 04/14/2022   C. 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) 03/31/2020   Adverse drug reaction 06/05/2018   Bacteremia associated with intravascular line (HCC) 04/20/2018   Febrile neutropenia (HCC) 04/13/2018   Gram positive sepsis (HCC) 04/13/2018   SIRS (systemic inflammatory response syndrome)  (HCC) 04/12/2018   Bacteremia due to Gram-positive bacteria 04/12/2018   Pancytopenia (HCC) 04/12/2018   Status post chemotherapy    UTI (urinary tract infection) 04/11/2018   Port-A-Cath in place 02/20/2018   Encounter for antineoplastic chemotherapy 02/17/2018   Sepsis due to urinary tract infection (HCC) 01/25/2018   Sepsis secondary to UTI (HCC) 01/25/2018   Hyperglycemia    Goals of care, counseling/discussion 01/18/2018   Bone metastasis 12/29/2017   Status post ileal conduit due to bladder cancer 06/29/2017   Bladder cancer (HCC) 06/28/2017   Stage IV malignant neoplasm of urinary bladder /with mets to the bones and lungs 04/20/2017   COPD (chronic obstructive pulmonary disease) (HCC) 03/07/2008    PCP: [name]  REFERRING PROVIDER: Doreatha [name], [name]  REFERRING DIAG:  Diagnosis Description  Please refer to outpatient PT for strengthening/conditioning    THERAPY DIAG:  Muscle weakness Difficulty in walking  Rationale for Evaluation and Treatment: Rehabilitation  ONSET DATE: 12/20/22   SUBJECTIVE STATEMENT: Pt accompanied by spouse.  Patient states he isnt as good as he was before. Getting weak.   Evaluation: [name] states that since his diagnosis he feels as if he runs out of breath and energy.  The pt has been using a walker/wheelchair  if he goes to any MD appointments.    PERTINENT HISTORY: Per hospitalist at discharge from hospital on 12/20/22, (pt hospitalized on 5/6)[name]  is an 82 y.o. male, with history of stage IV bladder cancer with metastasis to bones and lungs, s/p cystoprostatectomy, with lower quadrant ileal conduit formation, diverticulosis, atrial fibrillation with anticoagulation on apixaban, GERD who came to ED with generalized weakness, persistent shortness of breath and poor appetite.  Patient was seen in oncology clinic this morning where he underwent right sided thoracentesis ordered by oncologist Dr. Ellin [name].  1.8 L fluid was  removed from right pleural effusion.  Patient is scheduled to undergo left thoracentesis tomorrow.  Recently completed course of Augmentin without any improvement.  He was admitted with acute hypoxemic respiratory failure-multifactorial from bilateral pleural effusion with associated multi focal pneumonia.  He underwent left-sided thoracentesis with 700 mL of clear yellow fluid drained 5/7. This was again exudative by light's criteria with negative gram stain, no culture growth, negative cytology PAIN:  Are you having pain? No  PRECAUTIONS: Fall  WEIGHT BEARING RESTRICTIONS: No  FALLS:  Has patient fallen in last 6 months? No  LIVING ENVIRONMENT: Lives with: lives with their family Lives in: House/apartment Stairs: Yes: Internal: 8 steps; Pt has a lift chair and External: 1 steps; none Has following equipment at home: Single point cane and Walker - 2 wheeled  OCCUPATION: retired  PLOF:  Pt energy level has been down for the past 6 months prior to this he was able to walk where he wanted.  PATIENT GOALS: walk better.   NEXT MD VISIT: July 9th   OBJECTIVE:   COGNITION:  Overall cognitive status: Within functional limits for tasks assessed     LOWER EXTREMITY MMT:  MMT Right eval Left eval  Hip flexion 3 3  Hip extension    Hip abduction 3- 3  Hip adduction    Hip internal rotation    Hip external rotation    Knee flexion    Knee extension 4 5  Ankle dorsiflexion    Ankle plantarflexion    Ankle inversion    Ankle eversion     (Blank rows = not tested) FUNCTIONAL TESTS:  30 seconds chair stand test: using UE: 8 ; 6 is poor and 9 is below average 2 minute walk test: 226 ft pt SOB      TODAY'S TREATMENT:                                                                                                                              DATE:  03/08/23 Nustep, seat 9,  level 3 for dynamic warm up and conditioning Step taps 4 inch 2 x 10 bilateral  : 325 feet,  RPE 5/  10 SLS with vectors 5 x 5 second holds bilateral 2 HHA Step up 4  1 x 10 bilateral; RPE 5/ 10 Lateral stepping 4 x 10 feet bilateral  STS 2 x 5 with blue foam on seat, RPE 5/ 10  03/01/23 Nustep level 2  Marching x 10  Sit to stand x 5 reps x 2 set  Heel raise x 10 Minisquat x 10   Hip extension x 10  Side stepping x 2 Reps  2 minute walk test 226 ft. Forward lunge x 5 B   02/14/23 Sit to stands 10X with bil UE  295 no AD; SOB Diaphragmatic breathing Standing:  heelraises 10X  Hip abduction 10X  Hip extension 10X Supine:  Bridge 10X  SLR 10X each Sidelying hip abduction 10X each  02/09/23:  Eval Diaphragmatic breathing LAQ Rt x 10 Sit to stand x 5     PATIENT EDUCATION:  Education details: HEP, increasing reps at home Person educated: Patient and Spouse Education method: Explanation Education comprehension: returned demonstration  HOME EXERCISE PROGRAM: Access Code: [email or web address] URL: [email or web address]  Date: 02/14/2023 Prepared by: Emeline Gins Exercises - Supine Bridge  - 1 x daily - 7 x weekly - 1 sets - 10 reps - Small Range Straight Leg Raise  - 1 x daily - 7 x weekly - 1 sets - 10 reps - Sidelying Hip Abduction  - 1 x daily - 7 x weekly - 1 sets - 10 reps - Standing Heel Raise with Support  - 1 x daily - 7 x weekly - 1 sets - 10 reps - Standing Hip Abduction with Counter Support  - 1 x daily - 7 x weekly - 1 sets - 10 reps - Standing Hip Extension with Counter Support  - 1 x daily - 7 x weekly - 1 sets - 10 reps  Date: 02/09/2023 Prepared by: Virgina Organ Exercises - Seated Long Arc Quad  - 3 x daily - 7 x weekly - 1 sets - 10 reps - 5" hold - Sit to Stand with Counter Support  - 1 x daily - 7 x weekly - 1 sets - 10 reps - Seated Diaphragmatic Breathing  - 1 x daily - 7 x weekly - 1 sets - 10 reps  ASSESSMENT:  CLINICAL IMPRESSION: Began session on nustep for dynamic warm up and conditioning. Improving activity tolerance with  . Frequent cueing for posture throughout session with fair carry over. Continued with functional strengthening which is tolerated well. Continued difficulty with STS due to weakness, mechanics improve with foam on seat for elevated surface but min g/min A required without UE support.  Patient will continue to benefit from physical therapy in order to improve function and reduce impairment.   OBJECTIVE IMPAIRMENTS: decreased activity tolerance, decreased balance, and decreased strength.   ACTIVITY LIMITATIONS: carrying, lifting, standing, squatting, stairs, and locomotion level  PARTICIPATION LIMITATIONS: cleaning, shopping, community activity, and yard work  PERSONAL FACTORS: Fitness and 1-2 comorbidities: active cancer, COPD  are also affecting patient's functional outcome.   REHAB POTENTIAL: Fair    CLINICAL DECISION MAKING: Evolving/moderate complexity  EVALUATION COMPLEXITY: Moderate   GOALS: Goals reviewed with patient? No  SHORT TERM GOALS: Target date: 03/09/23 PT to be I in HEP in order to be able to walk for 325 ft without being SOB Baseline: Goal status: on-going  LONG TERM GOALS: Target date: 04/06/23  PT to be I in advanced HEP in order to be able to walk 500 ft to be able to walk to  MD visits. Baseline:  Goal status: on-going  2.  PT LE strength to improve to be able to complete 12 sit to stand in 30 seconds. For decreased risk of falls  Baseline:  Goal status: on-going   PLAN:  PT FREQUENCY: 1x/week  PT DURATION: 8 weeks  PLANNED INTERVENTIONS: Therapeutic exercises, Therapeutic activity, Balance training, Gait training, Patient/Family education, and Self Care  PLAN FOR NEXT SESSION: check pt 2 minute walk test each visit.  Advanced HEP but not to quickly as pt has low activity tolerance .  PT will be coming in one time a week only.    9:48 AM, 03/08/23 Wyman Songster PT, DPT Physical Therapist at [facility]

## 2023-03-15 ENCOUNTER — Ambulatory Visit (HOSPITAL_COMMUNITY): Payer: Medicare Other | Admitting: Physical Therapy

## 2023-03-15 ENCOUNTER — Encounter (HOSPITAL_COMMUNITY): Payer: Self-pay | Admitting: Physical Therapy

## 2023-03-15 ENCOUNTER — Other Ambulatory Visit: Payer: Self-pay

## 2023-03-15 DIAGNOSIS — M6281 Muscle weakness (generalized): Secondary | ICD-10-CM | POA: Diagnosis not present

## 2023-03-15 DIAGNOSIS — R262 Difficulty in walking, not elsewhere classified: Secondary | ICD-10-CM | POA: Diagnosis not present

## 2023-03-15 NOTE — Therapy (Signed)
OUTPATIENT PHYSICAL THERAPY TREATMENT   Patient Name: [name] MRN: [id number] DOB:07-15-1941, 82 y.o., male Today's Date: 03/15/2023  END OF SESSION:   PT End of Session - 03/15/23 0943     Visit Number 5    Number of Visits 8    Date for PT Re-Evaluation 04/06/23    Authorization Type medicare A    Progress Note Due on Visit 10    PT Start Time 0945    PT Stop Time 1025    PT Time Calculation (min) 40 min    Activity Tolerance Patient tolerated treatment well                  Past Medical History:  Diagnosis Date   Bladder cancer (HCC)    Bone cancer (HCC)    BPH (benign prostatic hypertrophy)    Dysuria    GERD (gastroesophageal reflux disease)    History of adenomatous polyp of colon    tubular adenoma 06/ 2018   History of bladder cancer 12/2014   urologist-  dr [name]--  dx High Grade TCC without stromal invasion s/p TURBT and intravesical BCG tx/  recurrent 07/2015 (pTa) TCC  repear intravesical BCG tx    History of hiatal hernia    History of urethral stricture    Nocturia    Past Surgical History:  Procedure Laterality Date   CARDIAC CATHETERIZATION  1997   normal coronary arteries   CARDIOVASCULAR STRESS TEST  12-14-2005   Low risk perfusion study/  very small scar in the inferior septum from mid ventricle to apex,  no ischemia/  mild inferior septal hypokinesis/  ef 58%   CATARACT EXTRACTION W/ INTRAOCULAR LENS  IMPLANT, BILATERAL  2011   COLONOSCOPY  last one 06/ 2018   CYSTOSCOPY WITH BIOPSY N/A 08/12/2015   Procedure: CYSTOSCOPY WITH BIOPSY AND FULGERATION;  Surgeon: Lance Laser, MD;  Location: [facility];  Service: Urology;  Laterality: N/A;   CYSTOSCOPY WITH INJECTION N/A 06/29/2017   Procedure: CYSTOSCOPY WITH INJECTION OF INDOCYANINE GREEN DYE;  Surgeon: Lance Ache, MD;  Location: [facility];  Service: Urology;  Laterality: N/A;   CYSTOSCOPY WITH URETHRAL DILATATION N/A 03/21/2017   Procedure: CYSTOSCOPY;  Surgeon:  Lance Laser, MD;  Location: [facility];  Service: Urology;  Laterality: N/A;   ESOPHAGOGASTRODUODENOSCOPY  12-05-2014   IR IMAGING GUIDED PORT INSERTION  02/07/2018   IR NEPHROSTOMY PLACEMENT LEFT  12/25/2021   IR REMOVAL TUN ACCESS W/ PORT W/O FL MOD SED  04/14/2018   TRANSTHORACIC ECHOCARDIOGRAM  01-31-2008   nomral LVF,  ef 55-65%/  mild pulmonic stenosis without regurg.,  peak transpulomonic valve gradient   TRANSURETHRAL RESECTION OF BLADDER TUMOR N/A 12/31/2014   Procedure: TRANSURETHRAL RESECTION OF BLADDER TUMOR (TURBT);  Surgeon: Lance Grand, MD;  Location: [facility];  Service: Urology;  Laterality: N/A;   TRANSURETHRAL RESECTION OF BLADDER TUMOR N/A 03/21/2017   Procedure: POSSIBLE TRANSURETHRAL RESECTION OF BLADDER TUMOR (TURBT);  Surgeon: Lance Laser, MD;  Location: [facility];  Service: Urology;  Laterality: N/A;   Patient Active Problem List   Diagnosis Date Noted   Acute hypoxemic respiratory failure (HCC) 12/23/2022   CAP (community acquired pneumonia) 12/20/2022   Chronic idiopathic urticaria 10/14/2022   Rash 10/14/2022   Venous stasis 10/14/2022   Generalized weakness 06/21/2022   Pleural effusion 06/21/2022   Paroxysmal atrial fibrillation (HCC) 04/14/2022   C. difficile diarrhea 12/26/2021   Pyelonephritis  12/23/2021   Metastatic cancer to intrathoracic lymph nodes (HCC) 10/30/2021   Sepsis secondary to urinary source 07/13/2020   Pseudomonas aeruginosa infection 06/18/2020   GERD (gastroesophageal reflux disease)    Sinus tachycardia    Fever    Dehydration    Rigors    Severe sepsis (HCC)    Elevated lactic acid level    Bladder cancer metastasized to lung (HCC) 03/31/2020   Adverse drug reaction 06/05/2018   Bacteremia associated with intravascular line (HCC) 04/20/2018   Febrile neutropenia (HCC) 04/13/2018   Gram positive sepsis (HCC) 04/13/2018   SIRS (systemic inflammatory response  syndrome) (HCC) 04/12/2018   Bacteremia due to Gram-positive bacteria 04/12/2018   Pancytopenia (HCC) 04/12/2018   Status post chemotherapy    UTI (urinary tract infection) 04/11/2018   Port-A-Cath in place 02/20/2018   Encounter for antineoplastic chemotherapy 02/17/2018   Sepsis due to urinary tract infection (HCC) 01/25/2018   Sepsis secondary to UTI (HCC) 01/25/2018   Hyperglycemia    Goals of care, counseling/discussion 01/18/2018   Bone metastasis 12/29/2017   Status post ileal conduit due to bladder cancer 06/29/2017   Bladder cancer (HCC) 06/28/2017   Stage IV malignant neoplasm of urinary bladder /with mets to the bones and lungs 04/20/2017   COPD (chronic obstructive pulmonary disease) (HCC) 03/07/2008    PCP: [name]  REFERRING PROVIDER: Doreatha [name], [name]  REFERRING DIAG:  Diagnosis Description  Please refer to outpatient PT for strengthening/conditioning    THERAPY DIAG:  Muscle weakness Difficulty in walking  Rationale for Evaluation and Treatment: Rehabilitation  ONSET DATE: 12/20/22   SUBJECTIVE STATEMENT: Pt accompanied by spouse.  Patient states he is doing alright, feels rough and runs out of energy.   Evaluation: [name] states that since his diagnosis he feels as if he runs out of breath and energy.  The pt has been using a walker/wheelchair  if he goes to any MD appointments.    PERTINENT HISTORY: Per hospitalist at discharge from hospital on 12/20/22, (pt hospitalized on 5/6)[name]  is an 82 y.o. male, with history of stage IV bladder cancer with metastasis to bones and lungs, s/p cystoprostatectomy, with lower quadrant ileal conduit formation, diverticulosis, atrial fibrillation with anticoagulation on apixaban, GERD who came to ED with generalized weakness, persistent shortness of breath and poor appetite.  Patient was seen in oncology clinic this morning where he underwent right sided thoracentesis ordered by oncologist Dr.  Ellin [name].  1.8 L fluid was removed from right pleural effusion.  Patient is scheduled to undergo left thoracentesis tomorrow.  Recently completed course of Augmentin without any improvement.  He was admitted with acute hypoxemic respiratory failure-multifactorial from bilateral pleural effusion with associated multi focal pneumonia.  He underwent left-sided thoracentesis with 700 mL of clear yellow fluid drained 5/7. This was again exudative by light's criteria with negative gram stain, no culture growth, negative cytology PAIN:  Are you having pain? No  PRECAUTIONS: Fall  WEIGHT BEARING RESTRICTIONS: No  FALLS:  Has patient fallen in last 6 months? No  LIVING ENVIRONMENT: Lives with: lives with their family Lives in: House/apartment Stairs: Yes: Internal: 8 steps; Pt has a lift chair and External: 1 steps; none Has following equipment at home: Single point cane and Walker - 2 wheeled  OCCUPATION: retired  PLOF:  Pt energy level has been down for the past 6 months prior to this he was able to walk where he wanted.  PATIENT GOALS: walk better.   NEXT MD VISIT:  July 9th   OBJECTIVE:   COGNITION: Overall cognitive status: Within functional limits for tasks assessed     LOWER EXTREMITY MMT:  MMT Right eval Left eval  Hip flexion 3 3  Hip extension    Hip abduction 3- 3  Hip adduction    Hip internal rotation    Hip external rotation    Knee flexion    Knee extension 4 5  Ankle dorsiflexion    Ankle plantarflexion    Ankle inversion    Ankle eversion     (Blank rows = not tested) FUNCTIONAL TESTS:  30 seconds chair stand test: using UE: 8 ; 6 is poor and 9 is below average 2 minute walk test: 226 ft pt SOB      TODAY'S TREATMENT:                                                                                                                              DATE:  03/15/23 Nustep, seat 9,  level 3 for dynamic warm up and conditioning Step up 6 inch 1 x 10 bilateral,  RPE 8/ 10 : able to ambulate 226 feet in 1:14 requiring rest break following due to increased coughing/fatigue; 340 feet,  RPE 8-9/10 Standing hip abduction 2 x 10 bilateral  Tandem stance 2 x 30 second holds  03/08/23 Nustep, seat 9,  level 3 for dynamic warm up and conditioning Step taps 4 inch 2 x 10 bilateral  : 325 feet,  RPE 5/ 10 SLS with vectors 5 x 5 second holds bilateral 2 HHA Step up 4  1 x 10 bilateral; RPE 5/ 10 Lateral stepping 4 x 10 feet bilateral  STS 2 x 5 with blue foam on seat, RPE 5/ 10  03/01/23 Nustep level 2  Marching x 10  Sit to stand x 5 reps x 2 set  Heel raise x 10 Minisquat x 10   Hip extension x 10  Side stepping x 2 Reps  2 minute walk test 226 ft. Forward lunge x 5 B   02/14/23 Sit to stands 10X with bil UE  295 no AD; SOB Diaphragmatic breathing Standing:  heelraises 10X  Hip abduction 10X  Hip extension 10X Supine:  Bridge 10X  SLR 10X each Sidelying hip abduction 10X each  02/09/23:  Eval Diaphragmatic breathing LAQ Rt x 10 Sit to stand x 5     PATIENT EDUCATION:  Education details: HEP, increasing reps at home Person educated: Patient and Spouse Education method: Explanation Education comprehension: returned demonstration  HOME EXERCISE PROGRAM: Access Code: [email or web address] URL: [email or web address]  Date: 02/14/2023 Prepared by: Emeline Gins Exercises - Supine Bridge  - 1 x daily - 7 x weekly - 1 sets - 10 reps - Small Range Straight Leg Raise  - 1 x daily - 7 x weekly - 1 sets - 10 reps - Sidelying Hip Abduction  - 1 x daily - 7 x weekly - 1  sets - 10 reps - Standing Heel Raise with Support  - 1 x daily - 7 x weekly - 1 sets - 10 reps - Standing Hip Abduction with Counter Support  - 1 x daily - 7 x weekly - 1 sets - 10 reps - Standing Hip Extension with Counter Support  - 1 x daily - 7 x weekly - 1 sets - 10 reps   Date: 02/09/2023 Prepared by: Virgina Organ Exercises - Seated Long Arc Quad  -  3 x daily - 7 x weekly - 1 sets - 10 reps - 5" hold - Sit to Stand with Counter Support  - 1 x daily - 7 x weekly - 1 sets - 10 reps - Seated Diaphragmatic Breathing  - 1 x daily - 7 x weekly - 1 sets - 10 reps  ASSESSMENT:  CLINICAL IMPRESSION: Began session on nustep for dynamic warm up and conditioning. Initial attempt at , patient began coughing requiring rest break midway through. Improving activity tolerance with . Frequent cueing for posture throughout session with fair carry over. Continued with functional strengthening which is tolerated well. Patient with increased fatigue today likely due to poor sleep last night but requires more frequent and longer rest breaks today. Did not advance HEP as patient with increased fatigue.  Patient will continue to benefit from physical therapy in order to improve function and reduce impairment.   OBJECTIVE IMPAIRMENTS: decreased activity tolerance, decreased balance, and decreased strength.   ACTIVITY LIMITATIONS: carrying, lifting, standing, squatting, stairs, and locomotion level  PARTICIPATION LIMITATIONS: cleaning, shopping, community activity, and yard work  PERSONAL FACTORS: Fitness and 1-2 comorbidities: active cancer, COPD  are also affecting patient's functional outcome.   REHAB POTENTIAL: Fair    CLINICAL DECISION MAKING: Evolving/moderate complexity  EVALUATION COMPLEXITY: Moderate   GOALS: Goals reviewed with patient? No  SHORT TERM GOALS: Target date: 03/09/23 PT to be I in HEP in order to be able to walk for 325 ft without being SOB Baseline: Goal status: on-going  LONG TERM GOALS: Target date: 04/06/23  PT to be I in advanced HEP in order to be able to walk 500 ft to be able to walk to  MD visits. Baseline:  Goal status: on-going  2.  PT LE strength to improve to be able to complete 12 sit to stand in 30 seconds. For decreased risk of falls  Baseline:  Goal status: on-going   PLAN:  PT FREQUENCY:  1x/week  PT DURATION: 8 weeks  PLANNED INTERVENTIONS: Therapeutic exercises, Therapeutic activity, Balance training, Gait training, Patient/Family education, and Self Care  PLAN FOR NEXT SESSION: check pt 2 minute walk test each visit.  Advanced HEP but not to quickly as pt has low activity tolerance .  PT will be coming in one time a week only.    9:43 AM, 03/15/23 Wyman Songster PT, DPT Physical Therapist at [facility]

## 2023-03-16 ENCOUNTER — Inpatient Hospital Stay: Payer: Medicare Other

## 2023-03-16 ENCOUNTER — Ambulatory Visit (HOSPITAL_COMMUNITY)
Admission: RE | Admit: 2023-03-16 | Discharge: 2023-03-16 | Disposition: A | Discharge status: Home / Self Care | Payer: Medicare Other | Source: Ambulatory Visit | Attending: Hematology | Admitting: Hematology

## 2023-03-16 ENCOUNTER — Inpatient Hospital Stay (HOSPITAL_BASED_OUTPATIENT_CLINIC_OR_DEPARTMENT_OTHER): Payer: Medicare Other | Admitting: Hematology

## 2023-03-16 VITALS — BP 124/86 | HR 84 | Temp 98.0°F | Resp 20 | Wt 173.0 lb

## 2023-03-16 VITALS — BP 106/67 | HR 79 | Temp 98.0°F | Resp 20

## 2023-03-16 DIAGNOSIS — R0602 Shortness of breath: Secondary | ICD-10-CM | POA: Insufficient documentation

## 2023-03-16 DIAGNOSIS — C679 Malignant neoplasm of bladder, unspecified: Secondary | ICD-10-CM | POA: Diagnosis not present

## 2023-03-16 DIAGNOSIS — J90 Pleural effusion, not elsewhere classified: Secondary | ICD-10-CM | POA: Insufficient documentation

## 2023-03-16 DIAGNOSIS — Z87891 Personal history of nicotine dependence: Secondary | ICD-10-CM | POA: Diagnosis not present

## 2023-03-16 DIAGNOSIS — C7951 Secondary malignant neoplasm of bone: Secondary | ICD-10-CM | POA: Diagnosis not present

## 2023-03-16 DIAGNOSIS — C7802 Secondary malignant neoplasm of left lung: Secondary | ICD-10-CM | POA: Diagnosis not present

## 2023-03-16 DIAGNOSIS — Z5112 Encounter for antineoplastic immunotherapy: Secondary | ICD-10-CM | POA: Diagnosis not present

## 2023-03-16 DIAGNOSIS — R9389 Abnormal findings on diagnostic imaging of other specified body structures: Secondary | ICD-10-CM | POA: Diagnosis not present

## 2023-03-16 DIAGNOSIS — C7801 Secondary malignant neoplasm of right lung: Secondary | ICD-10-CM | POA: Diagnosis not present

## 2023-03-16 LAB — CBC WITH DIFFERENTIAL/PLATELET
Abs Immature Granulocytes: 0.05 10*3/uL (ref 0.00–0.07)
Basophils Absolute: 0.1 10*3/uL (ref 0.0–0.1)
Basophils Relative: 1 %
Eosinophils Absolute: 0.3 10*3/uL (ref 0.0–0.5)
Eosinophils Relative: 3 %
HCT: 44 % (ref 39.0–52.0)
Hemoglobin: 14.4 g/dL (ref 13.0–17.0)
Immature Granulocytes: 1 %
Lymphocytes Relative: 8 %
Lymphs Abs: 0.7 10*3/uL (ref 0.7–4.0)
MCH: 36.1 pg — ABNORMAL HIGH (ref 26.0–34.0)
MCHC: 32.7 g/dL (ref 30.0–36.0)
MCV: 110.3 fL — ABNORMAL HIGH (ref 80.0–100.0)
Monocytes Absolute: 0.8 10*3/uL (ref 0.1–1.0)
Monocytes Relative: 9 %
Neutro Abs: 7.1 10*3/uL (ref 1.7–7.7)
Neutrophils Relative %: 78 %
Platelets: 270 10*3/uL (ref 150–400)
RBC: 3.99 MIL/uL — ABNORMAL LOW (ref 4.22–5.81)
RDW: 14.2 % (ref 11.5–15.5)
WBC: 9.1 10*3/uL (ref 4.0–10.5)
nRBC: 0 % (ref 0.0–0.2)

## 2023-03-16 LAB — COMPREHENSIVE METABOLIC PANEL
ALT: 17 U/L (ref 0–44)
AST: 13 U/L — ABNORMAL LOW (ref 15–41)
Albumin: 2.8 g/dL — ABNORMAL LOW (ref 3.5–5.0)
Alkaline Phosphatase: 61 U/L (ref 38–126)
Anion gap: 7 (ref 5–15)
BUN: 15 mg/dL (ref 8–23)
CO2: 25 mmol/L (ref 22–32)
Calcium: 8.6 mg/dL — ABNORMAL LOW (ref 8.9–10.3)
Chloride: 107 mmol/L (ref 98–111)
Creatinine, Ser: 1.08 mg/dL (ref 0.61–1.24)
GFR, Estimated: >60 mL/min (ref >60)
Glucose, Bld: 103 mg/dL — ABNORMAL HIGH (ref 70–99)
Potassium: 3.9 mmol/L (ref 3.5–5.1)
Sodium: 139 mmol/L (ref 135–145)
Total Bilirubin: 0.6 mg/dL (ref 0.3–1.2)
Total Protein: 5.5 g/dL — ABNORMAL LOW (ref 6.5–8.1)

## 2023-03-16 LAB — MAGNESIUM: Magnesium: 2.2 mg/dL (ref 1.7–2.4)

## 2023-03-16 MED ORDER — SODIUM CHLORIDE 0.9 % IV SOLN
200.0000 mg | Freq: Once | INTRAVENOUS | Status: AC
  Administered 2023-03-16: 200 mg via INTRAVENOUS
  Filled 2023-03-16: qty 8

## 2023-03-16 MED ORDER — SODIUM CHLORIDE 0.9 % IV SOLN: Freq: Once | INTRAVENOUS | Status: AC

## 2023-03-16 NOTE — Patient Instructions (Signed)

## 2023-03-16 NOTE — Progress Notes (Signed)
[facility] [address], [place] [address]    Clinic Day:  03/16/2023  Referring physician: Elfredia Nevins, MD  Patient Care Team: Elfredia Nevins, MD as PCP - General (Internal Medicine) Marinus Maw, MD as PCP - Electrophysiology (Cardiology) Doreatha Massed, MD as Medical Oncologist (Medical Oncology) Therese Sarah, RN as Oncology Nurse Navigator (Medical Oncology)   ASSESSMENT & PLAN:   Assessment: 1.  Metastatic urothelial carcinoma of the bladder to the lungs and bones: - Neoadjuvant chemotherapy with gemcitabine and cisplatin cycle 1 on 05/02/2017 - Cystoprostatectomy, bilateral lymphadenectomy by Dr. Berneice Heinrich on 06/29/2017, pathology T3b N0, 0/13 lymph nodes - Developed metastatic disease in April 2019, biopsy of the right ischium on 12/08/2017 with poorly differentiated carcinoma consistent with metastatic urothelial carcinoma. - XRT to the pelvis completed on 01/16/2018 - Carboplatin and gemcitabine 6 cycles from 01/30/2018 through 05/22/2018 - Pembrolizumab every 3 weeks started on 06/21/2018, discontinued in April 2020 due to skin toxicity - XRT to the right paratracheal lymph node, 50 Gray in 10 fractions completed on 04/23/2020 - XRT to the intrathoracic lymph node completed on 11/27/2021  - 12/20/2022: Right thoracentesis with 1.8 L removed - 12/21/2022: Left thoracentesis with 700 mL fluid removed, cytology negative. - Unclear etiology of recurrent effusions, exudative. - 2D echocardiogram: Normal LVEF with grade 1 diastolic dysfunction. - CT angiogram 12/20/2022: Negative for PE.  Multifocal parenchymal opacities.  No adenopathy. - Keytruda started on 01/07/2023.   2.  Social/family history: - Lives at home with his wife. - Worked for [employer].  Quit smoking 40 years ago. - No family history of malignancies.    Plan: 1.  Metastatic urothelial carcinoma of the bladder to the lungs and bones: - He has tolerated last cycle of  Keytruda reasonably well. - He took Ritalin 5 mg which did not help much. - I have told him to increase Ritalin to 10 mg in the mornings as needed to see if it helps with fatigue. - Labs today: Normal LFTs and creatinine.  CBC grossly normal.  TSH is 3.3. - Proceed with Keytruda today.  RTC 3 weeks for follow-up.  Will order PET scan prior to next visit.   2.  Dry cough: - He has cough in the mornings with expectoration of clear sputum.  After he coughs up secretions, cough improves.  He is not on any cough suppressants. - He complains of dyspnea on exertion.  Auscultation: Decreased breath sounds in the lower part of the right lung, similar to 3 weeks ago.  Will obtain chest x-ray to see if there is any worsening of pleural fluid accumulation.  If there is, will consider thoracentesis.   3.  Loss of appetite/weight loss: - He stopped taking Megace.  He is eating better and continuing to gain weight.  He gained 3 pounds.  4.  Immunotherapy induced skin rash: - Rash and itching has improved.  Continue prednisone 10 mg daily.    Orders Placed This Encounter  Procedures   DG Chest 2 View    Standing Status:   Future    Number of Occurrences:   1    Standing Expiration Date:   03/15/2024    Order Specific Question:   Reason for Exam (SYMPTOM  OR DIAGNOSIS REQUIRED)    Answer:   shortness of breath    Order Specific Question:   Preferred imaging location?    Answer:   [facility]   NM PET Image Restag (  PS) Skull Base To Thigh    Standing Status:   Future    Standing Expiration Date:   03/15/2024    Order Specific Question:   If indicated for the ordered procedure, I authorize the administration of a radiopharmaceutical per Radiology protocol    Answer:   Yes    Order Specific Question:   Preferred imaging location?    Answer:   [place]      I,Helena R Teague,acting as a Neurosurgeon for Doreatha Massed, MD.,have documented all relevant documentation on the behalf of Doreatha Massed, MD,as directed by  Doreatha Massed, MD while in the presence of Doreatha Massed, MD.  I, Doreatha Massed MD, have reviewed the above documentation for accuracy and completeness, and I agree with the above.     Doreatha Massed, MD   7/31/20245:32 PM  CHIEF COMPLAINT:   Diagnosis: Metastatic urothelial carcinoma of the bladder    Cancer Staging  No matching staging information was found for the patient.    Prior Therapy: 1. Neoadjuvant gemcitabine/cisplatin on 05/02/17 (one cycle) 2. Cystoprostatectomy and bilateral lymphadenectomy on 06/29/2017 (Dr. Berneice Heinrich) 3. Radiation therapy to the pelvic lesion 01/02/18 - 01/16/18 4. Carboplatin and gemcitabine, 6 cycles 02/06/18 - 05/22/18 5. Pembrolizumab 06/21/18 - 12/13/18 6. Radiation therapy to RUL and right paratracheal nodes 04/09/20 - 04/23/20 7. Radiation therapy to intrathoracic lymph nodes 11/16/21 - 11/27/21  Current Therapy:  pembrolizumab    HISTORY OF PRESENT ILLNESS:   Oncology History  Stage IV malignant neoplasm of urinary bladder /with mets to the bones and lungs  04/20/2017 Initial Diagnosis   Malignant neoplasm of urinary bladder (HCC)   01/30/2018 - 05/22/2018 Chemotherapy   The patient had palonosetron (ALOXI) injection 0.25 mg, 0.25 mg, Intravenous,  Once, 6 of 6 cycles Administration: 0.25 mg (01/30/2018), 0.25 mg (02/20/2018), 0.25 mg (03/13/2018), 0.25 mg (04/03/2018), 0.25 mg (05/01/2018), 0.25 mg (05/22/2018) CARBOplatin (PARAPLATIN) 400 mg in sodium chloride 0.9 % 250 mL chemo infusion, 400 mg (100 % of original dose 400 mg), Intravenous,  Once, 6 of 6 cycles Dose modification: 400 mg (original dose 400 mg, Cycle 1),   (original dose 400 mg, Cycle 2, Reason: Patient Age), 410 mg (original dose 400 mg, Cycle 6) Administration: 400 mg (01/30/2018), 400 mg (02/20/2018), 400 mg (03/13/2018), 410 mg (04/03/2018), 410 mg (05/01/2018), 410 mg (05/22/2018) gemcitabine (GEMZAR) 2,128 mg in sodium chloride 0.9 % 250 mL  chemo infusion, 1,000 mg/m2 = 2,128 mg, Intravenous,  Once, 6 of 6 cycles Dose modification: 800 mg/m2 (original dose 1,000 mg/m2, Cycle 4, Reason: Provider Judgment), 800 mg/m2 (original dose 1,000 mg/m2, Cycle 4, Reason: Provider Judgment) Administration: 2,128 mg (01/30/2018), 2,128 mg (02/06/2018), 2,128 mg (02/20/2018), 2,128 mg (02/27/2018), 2,128 mg (03/13/2018), 1,710 mg (04/03/2018), 1,710 mg (04/10/2018), 1,710 mg (05/01/2018), 1,710 mg (05/22/2018) fosaprepitant (EMEND) 150 mg, dexamethasone (DECADRON) 12 mg in sodium chloride 0.9 % 145 mL IVPB, , Intravenous,  Once, 6 of 6 cycles Administration:  (01/30/2018),  (02/20/2018),  (03/13/2018),  (04/03/2018),  (05/01/2018),  (05/22/2018)  for chemotherapy treatment.    01/07/2023 -  Chemotherapy   Patient is on Treatment Plan : BLADDER Pembrolizumab (200) q21d     Bladder cancer (HCC)  06/28/2017 Initial Diagnosis   Bladder cancer (HCC)   06/21/2018 - 12/13/2018 Chemotherapy   The patient had pembrolizumab (KEYTRUDA) 200 mg in sodium chloride 0.9 % 50 mL chemo infusion, 200 mg, Intravenous, Once, 8 of 9 cycles Administration: 200 mg (06/21/2018), 200 mg (07/12/2018), 200 mg (08/02/2018), 200 mg (  08/23/2018), 200 mg (09/13/2018), 200 mg (10/04/2018), 200 mg (11/22/2018), 200 mg (12/13/2018)  for chemotherapy treatment.       INTERVAL HISTORY:   [name] is a 82 y.o. male presenting to clinic today for follow up of Metastatic urothelial carcinoma of the bladder. He was last seen by me on 02/22/23.  Today, he states that he is doing well overall. His appetite level is at 75%. His energy level is at 50%.  His cough and SOB have worsened. His cough occasionally produces white sputum and says he cannot breath until he coughs up the sputum. This occurs most frequently in the morning and at night. When he walks he experiences SOB and has to rest frequently, so he feels as if he cannot walk. His wife notes he will not take cough medicine. He is eating better and he has  gained 3 pounds since our last visit. He has been taking ritalin and is unaware if it has an effect on his energy.   PAST MEDICAL HISTORY:   Past Medical History: Past Medical History:  Diagnosis Date   Bladder cancer (HCC)    Bone cancer (HCC)    BPH (benign prostatic hypertrophy)    Dysuria    GERD (gastroesophageal reflux disease)    History of adenomatous polyp of colon    tubular adenoma 06/ 2018   History of bladder cancer 12/2014   urologist-  dr Sherryl Barters--  dx High Grade TCC without stromal invasion s/p TURBT and intravesical BCG tx/  recurrent 07/2015 (pTa) TCC  repear intravesical BCG tx    History of hiatal hernia    History of urethral stricture    Nocturia     Surgical History: Past Surgical History:  Procedure Laterality Date   CARDIAC CATHETERIZATION  1997   normal coronary arteries   CARDIOVASCULAR STRESS TEST  12-14-2005   Low risk perfusion study/  very small scar in the inferior septum from mid ventricle to apex,  no ischemia/  mild inferior septal hypokinesis/  ef 58%   CATARACT EXTRACTION W/ INTRAOCULAR LENS  IMPLANT, BILATERAL  2011   COLONOSCOPY  last one 06/ 2018   CYSTOSCOPY WITH BIOPSY N/A 08/12/2015   Procedure: CYSTOSCOPY WITH BIOPSY AND FULGERATION;  Surgeon: Hildred Laser, MD;  Location: [facility];  Service: Urology;  Laterality: N/A;   CYSTOSCOPY WITH INJECTION N/A 06/29/2017   Procedure: CYSTOSCOPY WITH INJECTION OF INDOCYANINE GREEN DYE;  Surgeon: Sebastian Ache, MD;  Location: [facility];  Service: Urology;  Laterality: N/A;   CYSTOSCOPY WITH URETHRAL DILATATION N/A 03/21/2017   Procedure: CYSTOSCOPY;  Surgeon: Hildred Laser, MD;  Location: [facility];  Service: Urology;  Laterality: N/A;   ESOPHAGOGASTRODUODENOSCOPY  12-05-2014   IR IMAGING GUIDED PORT INSERTION  02/07/2018   IR NEPHROSTOMY PLACEMENT LEFT  12/25/2021   IR REMOVAL TUN ACCESS W/ PORT W/O FL MOD SED  04/14/2018   TRANSTHORACIC ECHOCARDIOGRAM   01-31-2008   nomral LVF,  ef 55-65%/  mild pulmonic stenosis without regurg.,  peak transpulomonic valve gradient   TRANSURETHRAL RESECTION OF BLADDER TUMOR N/A 12/31/2014   Procedure: TRANSURETHRAL RESECTION OF BLADDER TUMOR (TURBT);  Surgeon: Su Grand, MD;  Location: [facility];  Service: Urology;  Laterality: N/A;   TRANSURETHRAL RESECTION OF BLADDER TUMOR N/A 03/21/2017   Procedure: POSSIBLE TRANSURETHRAL RESECTION OF BLADDER TUMOR (TURBT);  Surgeon: Hildred Laser, MD;  Location: [facility];  Service: Urology;  Laterality: N/A;    Social  History: Social History   Socioeconomic History   Marital status: Married    Spouse name: Not on file   Number of children: 1   Years of education: Not on file   Highest education level: Not on file  Occupational History   Occupation: Retired  Tobacco Use   Smoking status: Former    Current packs/day: 0.00    Average packs/day: 1 pack/day for 34.0 years (34.0 ttl pk-yrs)    Types: Cigarettes    Start date: 12/27/1954    Quit date: 12/26/1988    Years since quitting: 34.2   Smokeless tobacco: Never  Vaping Use   Vaping status: Never Used  Substance and Sexual Activity   Alcohol use: Yes    Alcohol/week: 6.0 standard drinks of alcohol    Types: 6 Cans of beer per week    Comment: BEER   Drug use: No   Sexual activity: Not Currently  Other Topics Concern   Not on file  Social History Narrative   Not on file   Social Determinants of Health   Financial Resource Strain: Not on file  Food Insecurity: No Food Insecurity (12/20/2022)   Hunger Vital Sign    Worried About Running Out of Food in the Last Year: Never true    Ran Out of Food in the Last Year: Never true  Transportation Needs: No Transportation Needs (12/20/2022)   PRAPARE - Administrator, Civil Service (Medical): No    Lack of Transportation (Non-Medical): No  Physical Activity: Not on file  Stress: Not on file  Social  Connections: Unknown (12/28/2021)   Received from [facility]   Social Network    Social Network: Not on file  Intimate Partner Violence: Not At Risk (12/20/2022)   Humiliation, Afraid, Rape, and Kick questionnaire    Fear of Current or Ex-Partner: No    Emotionally Abused: No    Physically Abused: No    Sexually Abused: No    Family History: Family History  Problem Relation Age of Onset   Alcoholism Father    Colon cancer Neg Hx    Colon polyps Neg Hx    Diabetes Neg Hx    Kidney disease Neg Hx    Esophageal cancer Neg Hx    Heart disease Neg Hx    Gallbladder disease Neg Hx    Allergic rhinitis Neg Hx    Angioedema Neg Hx    Asthma Neg Hx    Atopy Neg Hx    Eczema Neg Hx    Immunodeficiency Neg Hx    Urticaria Neg Hx     Current Medications:  Current Outpatient Medications:    acetaminophen (TYLENOL) 500 MG tablet, Take 1,000 mg by mouth every 6 (six) hours as needed for mild pain, fever, moderate pain or headache., Disp: , Rfl:    apixaban (ELIQUIS) 5 MG TABS tablet, Take 1 tablet (5 mg total) by mouth 2 (two) times daily., Disp: 60 tablet, Rfl: 2   betamethasone dipropionate 0.05 % cream, Apply topically 2 (two) times daily., Disp: 60 g, Rfl: 2   budesonide-formoterol (SYMBICORT) 160-4.5 MCG/ACT inhaler, Inhale 2 puffs into the lungs 2 (two) times daily., Disp: 1 each, Rfl: 6   chlorpheniramine-HYDROcodone (TUSSIONEX) 10-8 MG/5ML, Take 5 mLs by mouth every 12 (twelve) hours as needed (severe cough)., Disp: 70 mL, Rfl: 0   dextromethorphan-guaiFENesin (MUCINEX DM) 30-600 MG 12hr tablet, Take 1 tablet by mouth 2 (two) times daily., Disp: , Rfl:    hydrOXYzine (  ATARAX) 25 MG tablet, Take 1 tablet (25 mg total) by mouth 3 (three) times daily as needed., Disp: 90 tablet, Rfl: 1   megestrol (MEGACE) 400 MG/10ML suspension, Take 10 mLs (400 mg total) by mouth 2 (two) times daily., Disp: 480 mL, Rfl: 3   methenamine (HIPREX) 1 g tablet, Take 1 g by mouth 2 (two) times daily.,  Disp: , Rfl:    methylphenidate (RITALIN) 5 MG tablet, Take 1 tablet (5 mg total) by mouth daily as needed., Disp: 30 tablet, Rfl: 0   metoprolol tartrate (LOPRESSOR) 25 MG tablet, Take 1 tablet (25 mg total) by mouth 2 (two) times daily., Disp: 60 tablet, Rfl: 2   pantoprazole (PROTONIX) 40 MG tablet, Take 1 tablet (40 mg total) by mouth 2 (two) times daily., Disp: 60 tablet, Rfl: 0   predniSONE (DELTASONE) 10 MG tablet, Take 1 tablet (10 mg total) by mouth daily with breakfast., Disp: 30 tablet, Rfl: 4  Current Facility-Administered Medications:    omalizumab Geoffry Paradise) prefilled syringe 300 mg, 300 mg, Subcutaneous, Q14 Days, Alfonse Spruce, MD, 300 mg at 11/24/22 1914   Allergies: Allergies  Allergen Reactions   Ciprofloxacin Hives   Zofran [Ondansetron] Rash    REVIEW OF SYSTEMS:   Review of Systems  Constitutional:  Negative for chills, fatigue and fever.  HENT:   Negative for lump/mass, mouth sores, nosebleeds, sore throat and trouble swallowing.   Eyes:  Negative for eye problems.  Respiratory:  Positive for cough (productive) and shortness of breath.   Cardiovascular:  Negative for chest pain, leg swelling and palpitations.  Gastrointestinal:  Negative for abdominal pain, constipation, diarrhea, nausea and vomiting.  Genitourinary:  Negative for bladder incontinence, difficulty urinating, dysuria, frequency, hematuria and nocturia.   Musculoskeletal:  Negative for arthralgias, back pain, flank pain, myalgias and neck pain.  Skin:  Negative for itching and rash.  Neurological:  Negative for dizziness, headaches and numbness.  Hematological:  Does not bruise/bleed easily.  Psychiatric/Behavioral:  Negative for depression, sleep disturbance and suicidal ideas. The patient is not nervous/anxious.   All other systems reviewed and are negative.    VITALS:   Blood pressure 124/86, pulse 84, temperature 98 F (36.7 C), temperature source Tympanic, resp. rate 20, weight 173  lb (78.5 kg), SpO2 97%.  Wt Readings from Last 3 Encounters:  03/16/23 173 lb (78.5 kg)  02/22/23 170 lb 4.8 oz (77.2 kg)  02/21/23 169 lb 9.6 oz (76.9 kg)    Body mass index is 24.13 kg/m.  Performance status (ECOG): 1 - Symptomatic but completely ambulatory  PHYSICAL EXAM:   Physical Exam Vitals and nursing note reviewed. Exam conducted with a chaperone present.  Constitutional:      Appearance: Normal appearance.  Cardiovascular:     Rate and Rhythm: Normal rate and regular rhythm.     Pulses: Normal pulses.     Heart sounds: Normal heart sounds.  Pulmonary:     Effort: Pulmonary effort is normal.     Breath sounds: Decreased breath sounds (in the right lower lung) present.  Abdominal:     Palpations: Abdomen is soft. There is no hepatomegaly, splenomegaly or mass.     Tenderness: There is no abdominal tenderness.  Musculoskeletal:     Right lower leg: No edema.     Left lower leg: No edema.  Lymphadenopathy:     Cervical: No cervical adenopathy.     Right cervical: No superficial, deep or posterior cervical adenopathy.    Left cervical: No  superficial, deep or posterior cervical adenopathy.     Upper Body:     Right upper body: No supraclavicular or axillary adenopathy.     Left upper body: No supraclavicular or axillary adenopathy.  Neurological:     General: No focal deficit present.     Mental Status: He is alert and oriented to person, place, and time.  Psychiatric:        Mood and Affect: Mood normal.        Behavior: Behavior normal.     LABS:      Latest Ref Rng & Units 03/16/2023   12:25 PM 02/22/2023   12:22 PM 02/01/2023   12:57 PM  CBC  WBC 4.0 - 10.5 K/uL 9.1  14.8  10.1   Hemoglobin 13.0 - 17.0 g/dL 09.8  11.9  14.7   Hematocrit 39.0 - 52.0 % 44.0  45.5  43.4   Platelets 150 - 400 K/uL 270  210  277       Latest Ref Rng & Units 03/16/2023   12:25 PM 02/22/2023   12:22 PM 02/01/2023   12:57 PM  CMP  Glucose 70 - 99 mg/dL 829  562  130   BUN 8  - 23 mg/dL 15  18  19    Creatinine 0.61 - 1.24 mg/dL 8.65  7.84  6.96   Sodium 135 - 145 mmol/L 139  138  137   Potassium 3.5 - 5.1 mmol/L 3.9  3.7  3.5   Chloride 98 - 111 mmol/L 107  108  107   CO2 22 - 32 mmol/L 25  20  19    Calcium 8.9 - 10.3 mg/dL 8.6  8.4  8.7   Total Protein 6.5 - 8.1 g/dL 5.5  5.9  6.5   Total Bilirubin 0.3 - 1.2 mg/dL 0.6  0.9  0.7   Alkaline Phos 38 - 126 U/L 61  49  55   AST 15 - 41 U/L 13  20  25    ALT 0 - 44 U/L 17  37  31      No results found for: "CEA1", "CEA" / No results found for: "CEA1", "CEA" No results found for: "PSA1" No results found for: "EXB284" No results found for: "CAN125"  No results found for: "TOTALPROTELP", "ALBUMINELP", "A1GS", "A2GS", "BETS", "BETA2SER", "GAMS", "MSPIKE", "SPEI" Lab Results  Component Value Date   TIBC 249 03/20/2018   FERRITIN 759 (H) 03/20/2018   IRONPCTSAT 20 (L) 03/20/2018   Lab Results  Component Value Date   LDH 96 (L) 06/21/2022     STUDIES:   DG Chest 2 View  Result Date: 02/20/2023 CLINICAL DATA:  cough EXAM: CHEST - 2 VIEW COMPARISON:  12/21/2022 FINDINGS: Cardiac silhouette is unremarkable. There is prominence of main pulmonary artery segment which may indicate underlying pulmonic stenosis. No pneumothorax identified. There is a moderate right-sided pleural effusion with some loculation along the fissures. The lungs are clear. The visualized skeletal structures are unremarkable. IMPRESSION: Moderate right-sided pleural effusion. Prominence of main pulmonary artery which could indicate underlying pulmonic stenosis. Electronically Signed   By: Layla Maw M.D.   On: 02/20/2023 15:02

## 2023-03-16 NOTE — Progress Notes (Signed)
Patient presents today for Keytruda infusion. Patient is in satisfactory condition with no new complaints voiced.  Vital signs are stable.  Labs reviewed by Dr. Ellin Saba during the office visit and all labs are within treatment parameters.  We will proceed with treatment per MD orders.   Treatment given today per MD orders. Tolerated infusion without adverse affects. Vital signs stable. No complaints at this time. Discharged from clinic via wheelchair in stable condition. Alert and oriented x 3. F/U with [facility] as scheduled.

## 2023-03-16 NOTE — Patient Instructions (Signed)
[facility]  Discharge Instructions: Thank you for choosing [facility] to provide your oncology and hematology care.  If you have a lab appointment with the Cancer Center - please note that after April 8th, 2024, all labs will be drawn in the cancer center.  You do not have to check in or register with the main entrance as you have in the past but will complete your check-in in the cancer center.  Wear comfortable clothing and clothing appropriate for easy access to any Portacath or PICC line.   We strive to give you quality time with your provider. You may need to reschedule your appointment if you arrive late (15 or more minutes).  Arriving late affects you and other patients whose appointments are after yours.  Also, if you miss three or more appointments without notifying the office, you may be dismissed from the clinic at the provider's discretion.      For prescription refill requests, have your pharmacy contact our office and allow 72 hours for refills to be completed.    Today you received the following chemotherapy and/or immunotherapy agents Keytruda   To help prevent nausea and vomiting after your treatment, we encourage you to take your nausea medication as directed.  Pembrolizumab Injection What is this medication? PEMBROLIZUMAB (PEM broe LIZ ue mab) treats some types of cancer. It works by helping your immune system slow or stop the spread of cancer cells. It is a monoclonal antibody. This medicine may be used for other purposes; ask your health care provider or pharmacist if you have questions. COMMON BRAND NAME(S): Keytruda What should I tell my care team before I take this medication? They need to know if you have any of these conditions: Allogeneic stem cell transplant (uses someone else's stem cells) Autoimmune diseases, such as Crohn disease, ulcerative colitis, lupus History of chest radiation Nervous system problems, such as  Guillain-Barre syndrome, myasthenia gravis Organ transplant An unusual or allergic reaction to pembrolizumab, other medications, foods, dyes, or preservatives Pregnant or trying to get pregnant Breast-feeding How should I use this medication? This medication is injected into a vein. It is given by your care team in a hospital or clinic setting. A special MedGuide will be given to you before each treatment. Be sure to read this information carefully each time. Talk to your care team about the use of this medication in children. While it may be prescribed for children as young as 6 months for selected conditions, precautions do apply. Overdosage: If you think you have taken too much of this medicine contact a poison control center or emergency room at once. NOTE: This medicine is only for you. Do not share this medicine with others. What if I miss a dose? Keep appointments for follow-up doses. It is important not to miss your dose. Call your care team if you are unable to keep an appointment. What may interact with this medication? Interactions have not been studied. This list may not describe all possible interactions. Give your health care provider a list of all the medicines, herbs, non-prescription drugs, or dietary supplements you use. Also tell them if you smoke, drink alcohol, or use illegal drugs. Some items may interact with your medicine. What should I watch for while using this medication? Your condition will be monitored carefully while you are receiving this medication. You may need blood work while taking this medication. This medication may cause serious skin reactions. They can happen weeks to months   after starting the medication. Contact your care team right away if you notice fevers or flu-like symptoms with a rash. The rash may be red or purple and then turn into blisters or peeling of the skin. You may also notice a red rash with swelling of the face, lips, or lymph nodes in your  neck or under your arms. Tell your care team right away if you have any change in your eyesight. Talk to your care team if you may be pregnant. Serious birth defects can occur if you take this medication during pregnancy and for 4 months after the last dose. You will need a negative pregnancy test before starting this medication. Contraception is recommended while taking this medication and for 4 months after the last dose. Your care team can help you find the option that works for you. Do not breastfeed while taking this medication and for 4 months after the last dose. What side effects may I notice from receiving this medication? Side effects that you should report to your care team as soon as possible: Allergic reactions--skin rash, itching, hives, swelling of the face, lips, tongue, or throat Dry cough, shortness of breath or trouble breathing Eye pain, redness, irritation, or discharge with blurry or decreased vision Heart muscle inflammation--unusual weakness or fatigue, shortness of breath, chest pain, fast or irregular heartbeat, dizziness, swelling of the ankles, feet, or hands Hormone gland problems--headache, sensitivity to light, unusual weakness or fatigue, dizziness, fast or irregular heartbeat, increased sensitivity to cold or heat, excessive sweating, constipation, hair loss, increased thirst or amount of urine, tremors or shaking, irritability Infusion reactions--chest pain, shortness of breath or trouble breathing, feeling faint or lightheaded Kidney injury (glomerulonephritis)--decrease in the amount of urine, red or dark brown urine, foamy or bubbly urine, swelling of the ankles, hands, or feet Liver injury--right upper belly pain, loss of appetite, nausea, light-colored stool, dark yellow or brown urine, yellowing skin or eyes, unusual weakness or fatigue Pain, tingling, or numbness in the hands or feet, muscle weakness, change in vision, confusion or trouble speaking, loss of  balance or coordination, trouble walking, seizures Rash, fever, and swollen lymph nodes Redness, blistering, peeling, or loosening of the skin, including inside the mouth Sudden or severe stomach pain, bloody diarrhea, fever, nausea, vomiting Side effects that usually do not require medical attention (report to your care team if they continue or are bothersome): Bone, joint, or muscle pain Diarrhea Fatigue Loss of appetite Nausea Skin rash This list may not describe all possible side effects. Call your doctor for medical advice about side effects. You may report side effects to FDA at 1-800-FDA-1088. Where should I keep my medication? This medication is given in a hospital or clinic. It will not be stored at home. NOTE: This sheet is a summary. It may not cover all possible information. If you have questions about this medicine, talk to your doctor, pharmacist, or health care provider.  2024 Elsevier/Gold Standard (2021-12-15 00:00:00)   BELOW ARE SYMPTOMS THAT SHOULD BE REPORTED IMMEDIATELY: *FEVER GREATER THAN 100.4 F (38 C) OR HIGHER *CHILLS OR SWEATING *NAUSEA AND VOMITING THAT IS NOT CONTROLLED WITH YOUR NAUSEA MEDICATION *UNUSUAL SHORTNESS OF BREATH *UNUSUAL BRUISING OR BLEEDING *URINARY PROBLEMS (pain or burning when urinating, or frequent urination) *BOWEL PROBLEMS (unusual diarrhea, constipation, pain near the anus) TENDERNESS IN MOUTH AND THROAT WITH OR WITHOUT PRESENCE OF ULCERS (sore throat, sores in mouth, or a toothache) UNUSUAL RASH, SWELLING OR PAIN  UNUSUAL VAGINAL DISCHARGE OR ITCHING   Items   with * indicate a potential emergency and should be followed up as soon as possible or go to the Emergency Department if any problems should occur.  Please show the CHEMOTHERAPY ALERT CARD or IMMUNOTHERAPY ALERT CARD at check-in to the Emergency Department and triage nurse.  Should you have questions after your visit or need to cancel or reschedule your appointment, please  contact [facility] [phone]  and follow the prompts.  Office hours are 8:00 a.m. to 4:30 p.m. Monday - Friday. Please note that voicemails left after 4:00 p.m. may not be returned until the following business day.  We are closed weekends and major holidays. You have access to a nurse at all times for urgent questions. Please call the main number to the clinic [phone] and follow the prompts.  For any non-urgent questions, you may also contact your provider using MyChart. We now offer e-Visits for anyone 18 and older to request care online for non-urgent symptoms. For details visit [email or web address].   Also download the MyChart app! Go to the app store, search "MyChart", open the app, select [place], and log in with your MyChart username and password.   

## 2023-03-22 ENCOUNTER — Ambulatory Visit (HOSPITAL_COMMUNITY): Payer: Medicare Other | Attending: Hematology | Admitting: Physical Therapy

## 2023-03-22 DIAGNOSIS — M6281 Muscle weakness (generalized): Secondary | ICD-10-CM | POA: Insufficient documentation

## 2023-03-22 DIAGNOSIS — R262 Difficulty in walking, not elsewhere classified: Secondary | ICD-10-CM | POA: Insufficient documentation

## 2023-03-22 NOTE — Therapy (Signed)
OUTPATIENT PHYSICAL THERAPY TREATMENT   Patient Name: [name] MRN: [id number] DOB:1941-06-03, 82 y.o., male Today's Date: 03/22/2023  END OF SESSION:   PT End of Session - 03/22/23 1158    Visit Number 6    Number of Visits 8    Date for PT Re-Evaluation 04/06/23    Authorization Type medicare A    Progress Note Due on Visit 10    PT Start Time 1118   PT Stop Time 1158   PT Time Calculation (min) 40 min    Activity Tolerance Patient tolerated treatment well;Patient limited by fatigue                  Past Medical History:  Diagnosis Date   Bladder cancer (HCC)    Bone cancer (HCC)    BPH (benign prostatic hypertrophy)    Dysuria    GERD (gastroesophageal reflux disease)    History of adenomatous polyp of colon    tubular adenoma 06/ 2018   History of bladder cancer 12/2014   urologist-  dr Sherryl Barters--  dx High Grade TCC without stromal invasion s/p TURBT and intravesical BCG tx/  recurrent 07/2015 (pTa) TCC  repear intravesical BCG tx    History of hiatal hernia    History of urethral stricture    Nocturia    Past Surgical History:  Procedure Laterality Date   CARDIAC CATHETERIZATION  1997   normal coronary arteries   CARDIOVASCULAR STRESS TEST  12-14-2005   Low risk perfusion study/  very small scar in the inferior septum from mid ventricle to apex,  no ischemia/  mild inferior septal hypokinesis/  ef 58%   CATARACT EXTRACTION W/ INTRAOCULAR LENS  IMPLANT, BILATERAL  2011   COLONOSCOPY  last one 06/ 2018   CYSTOSCOPY WITH BIOPSY N/A 08/12/2015   Procedure: CYSTOSCOPY WITH BIOPSY AND FULGERATION;  Surgeon: Hildred Laser, MD;  Location[facility];  Service: Urology;  Laterality: N/A;   CYSTOSCOPY WITH INJECTION N/A 06/29/2017   Procedure: CYSTOSCOPY WITH INJECTION OF INDOCYANINE GREEN DYE;  Surgeon: Sebastian Ache, MD;  Location: [facility];  Service: Urology;  Laterality: N/A;   CYSTOSCOPY WITH URETHRAL DILATATION N/A 03/21/2017    Procedure: CYSTOSCOPY;  Surgeon: Hildred Laser, MD;  Location: [facility];  Service: Urology;  Laterality: N/A;   ESOPHAGOGASTRODUODENOSCOPY  12-05-2014   IR IMAGING GUIDED PORT INSERTION  02/07/2018   IR NEPHROSTOMY PLACEMENT LEFT  12/25/2021   IR REMOVAL TUN ACCESS W/ PORT W/O FL MOD SED  04/14/2018   TRANSTHORACIC ECHOCARDIOGRAM  01-31-2008   nomral LVF,  ef 55-65%/  mild pulmonic stenosis without regurg.,  peak transpulomonic valve gradient   TRANSURETHRAL RESECTION OF BLADDER TUMOR N/A 12/31/2014   Procedure: TRANSURETHRAL RESECTION OF BLADDER TUMOR (TURBT);  Surgeon: Su Grand, MD;  Location: [facility];  Service: Urology;  Laterality: N/A;   TRANSURETHRAL RESECTION OF BLADDER TUMOR N/A 03/21/2017   Procedure: POSSIBLE TRANSURETHRAL RESECTION OF BLADDER TUMOR (TURBT);  Surgeon: Hildred Laser, MD;  Location: [facility];  Service: Urology;  Laterality: N/A;   Patient Active Problem List   Diagnosis Date Noted   Acute hypoxemic respiratory failure (HCC) 12/23/2022   CAP (community acquired pneumonia) 12/20/2022   Chronic idiopathic urticaria 10/14/2022   Rash 10/14/2022   Venous stasis 10/14/2022   Generalized weakness 06/21/2022   Pleural effusion 06/21/2022   Paroxysmal atrial fibrillation (HCC) 04/14/2022   C. difficile diarrhea 12/26/2021   Pyelonephritis  12/23/2021   Metastatic cancer to intrathoracic lymph nodes (HCC) 10/30/2021   Sepsis secondary to urinary source 07/13/2020   Pseudomonas aeruginosa infection 06/18/2020   GERD (gastroesophageal reflux disease)    Sinus tachycardia    Fever    Dehydration    Rigors    Severe sepsis (HCC)    Elevated lactic acid level    Bladder cancer metastasized to lung (HCC) 03/31/2020   Adverse drug reaction 06/05/2018   Bacteremia associated with intravascular line (HCC) 04/20/2018   Febrile neutropenia (HCC) 04/13/2018   Gram positive sepsis (HCC) 04/13/2018   SIRS  (systemic inflammatory response syndrome) (HCC) 04/12/2018   Bacteremia due to Gram-positive bacteria 04/12/2018   Pancytopenia (HCC) 04/12/2018   Status post chemotherapy    UTI (urinary tract infection) 04/11/2018   Port-A-Cath in place 02/20/2018   Encounter for antineoplastic chemotherapy 02/17/2018   Sepsis due to urinary tract infection (HCC) 01/25/2018   Sepsis secondary to UTI (HCC) 01/25/2018   Hyperglycemia    Goals of care, counseling/discussion 01/18/2018   Bone metastasis 12/29/2017   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  Patient states he is doing alright, feels rough and runs out of energy.   Evaluation: [name] states that since his diagnosis he feels as if he runs out of breath and energy.  The pt has been using a walker/wheelchair  if he goes to any MD appointments.    PERTINENT HISTORY: Per hospitalist at discharge from hospital on 12/20/22, (pt hospitalized on 5/6)[name]  is an 82 y.o. male, with history of stage IV bladder cancer with metastasis to bones and lungs, s/p cystoprostatectomy, with lower quadrant ileal conduit formation, diverticulosis, atrial fibrillation with anticoagulation on apixaban, GERD who came to ED with generalized weakness, persistent shortness of breath and poor appetite.  Patient was seen in oncology clinic this morning where he underwent right sided thoracentesis  ordered by oncologist Dr. Ellin Saba.  1.8 L fluid was removed from right pleural effusion.  Patient is scheduled to undergo left thoracentesis tomorrow.  Recently completed course of Augmentin without any improvement.  He was admitted with acute hypoxemic respiratory failure-multifactorial from bilateral pleural effusion with associated multi focal pneumonia.  He underwent left-sided thoracentesis with 700 mL of clear yellow fluid drained 5/7. This was again exudative by light's criteria with negative gram stain, no culture growth, negative cytology PAIN:  Are you having pain? No  PRECAUTIONS: Fall  WEIGHT BEARING RESTRICTIONS: No  FALLS:  Has patient fallen in last 6 months? No  LIVING ENVIRONMENT: Lives with: lives with their family Lives in: House/apartment Stairs: Yes: Internal: 8 steps; Pt has a lift chair and External: 1 steps; none Has following equipment at home: Single point cane and Walker - 2 wheeled  OCCUPATION: retired  PLOF:  Pt energy level has been down for the past 6 months prior to this he was able to walk where he wanted.  PATIENT GOALS: walk better.   NEXT MD VISIT:  July 9th   OBJECTIVE:   COGNITION: Overall cognitive status: Within functional limits for tasks assessed     LOWER EXTREMITY MMT:  MMT Right eval Left eval  Hip flexion 3 3  Hip extension    Hip abduction 3- 3  Hip adduction    Hip internal rotation    Hip external rotation    Knee flexion    Knee extension 4 5  Ankle dorsiflexion    Ankle plantarflexion    Ankle inversion    Ankle eversion     (Blank rows = not tested) FUNCTIONAL TESTS:  30 seconds chair stand test: using UE: 8 ; 6 is poor and 9 is below average 2 minute walk test: 226 ft pt SOB      TODAY'S TREATMENT:                                                                                                                              DATE:  03/22/23 2 MWT:  341 ft; total walking 391 ft  Tandem stance x 5 with both rt  and Lt in front  Side step at counter x 2 RT 6" step up x 10 B  Heel raise x 15 Squat x 10 Lunge x 10 March x 10   03/15/23 Nustep, seat 9,  level 3 for dynamic warm up and conditioning Step up 6 inch 1 x 10 bilateral, RPE 8/ 10 : able to ambulate 226 feet in 1:14 requiring rest break following due to increased coughing/fatigue; 340 feet,  RPE 8-9/10 Standing hip abduction 2 x 10 bilateral  Tandem stance 2 x 30 second holds  03/08/23 Nustep, seat 9,  level 3 for dynamic warm up and conditioning Step taps 4 inch 2 x 10 bilateral  : 325 feet,  RPE 5/ 10 SLS with vectors 5 x 5 second holds bilateral 2 HHA Step up 4  1 x 10 bilateral; RPE 5/ 10 Lateral stepping 4 x 10 feet bilateral  STS 2 x 5 with blue foam on seat, RPE 5/ 10  03/01/23 Nustep level 2  Marching x 10  Sit to stand x 5 reps x 2 set  Heel raise x 10 Minisquat x 10   Hip extension x 10  Side stepping x 2 Reps  2 minute walk test 226 ft. Forward lunge x 5 B   02/14/23 Sit to stands 10X with bil UE  295 no AD; SOB Diaphragmatic breathing Standing:  heelraises 10X  Hip abduction 10X  Hip extension 10X Supine:  Bridge 10X  SLR 10X each Sidelying hip abduction 10X each  02/09/23:  Eval Diaphragmatic breathing LAQ Rt x 10 Sit to stand x 5     PATIENT EDUCATION:  Education details: HEP, increasing reps at home Person educated: Patient and Spouse Education method: Explanation Education comprehension: returned demonstration  HOME EXERCISE PROGRAM: Access Code: [email or web address] URL: [email or web address] 03/22/23 - Side Stepping with Counter Support  - 1  x daily - 7 x weekly - 1 sets - 3 reps - Walking with Counter Support  - 1 x daily - 7 x weekly - 1 sets - 5 reps Date: 02/14/2023 Prepared by: Emeline Gins Exercises - Supine Bridge  - 1 x daily - 7 x weekly - 1 sets - 10 reps - Small Range Straight Leg Raise  - 1 x daily - 7 x weekly - 1 sets - 10 reps - Sidelying Hip Abduction  - 1 x  daily - 7 x weekly - 1 sets - 10 reps - Standing Heel Raise with Support  - 1 x daily - 7 x weekly - 1 sets - 10 reps - Standing Hip Abduction with Counter Support  - 1 x daily - 7 x weekly - 1 sets - 10 reps - Standing Hip Extension with Counter Support  - 1 x daily - 7 x weekly - 1 sets - 10 reps   Date: 02/09/2023 Prepared by: Virgina Organ Exercises - Seated Long Arc Quad  - 3 x daily - 7 x weekly - 1 sets - 10 reps - 5" hold - Sit to Stand with Counter Support  - 1 x daily - 7 x weekly - 1 sets - 10 reps - Seated Diaphragmatic Breathing  - 1 x daily - 7 x weekly - 1 sets - 10 reps  ASSESSMENT:  CLINICAL IMPRESSION: Pt improved significantly in his 2 MWT.  Pt continues to need frequent rest breaks due to being SOB.  Pt with increased knee pain following squats.  Pt may benefit from pulmonary rehab following PT>  Frequent cueing for posture throughout session with fair carry over. Continued with functional strengthening which is tolerated well. Advanced HEP  Patient will continue to benefit from physical therapy in order to improve function and reduce impairment.   OBJECTIVE IMPAIRMENTS: decreased activity tolerance, decreased balance, and decreased strength.   ACTIVITY LIMITATIONS: carrying, lifting, standing, squatting, stairs, and locomotion level  PARTICIPATION LIMITATIONS: cleaning, shopping, community activity, and yard work  PERSONAL FACTORS: Fitness and 1-2 comorbidities: active cancer, COPD  are also affecting patient's functional outcome.   REHAB POTENTIAL: Fair    CLINICAL DECISION MAKING: Evolving/moderate complexity  EVALUATION COMPLEXITY: Moderate   GOALS: Goals reviewed with patient? No  SHORT TERM GOALS: Target date: 03/09/23 PT to be I in HEP in order to be able to walk for 325 ft without being SOB Baseline: Goal status: met  LONG TERM GOALS: Target date: 04/06/23  PT to be I in advanced HEP in order to be able to walk 500 ft to be able to walk to  MD  visits. Baseline:  Goal status: on-going  2.  PT LE strength to improve to be able to complete 12 sit to stand in 30 seconds. For decreased risk of falls  Baseline:  Goal status: on-going   PLAN:  PT FREQUENCY: 1x/week  PT DURATION: 8 weeks  PLANNED INTERVENTIONS: Therapeutic exercises, Therapeutic activity, Balance training, Gait training, Patient/Family education, and Self Care  PLAN FOR NEXT SESSION: check pt 2 minute walk test each visit.  Advanced HEP but not to quickly as pt has low activity tolerance .  PT will be coming in one time a week only.   Virgina Organ PT/CLT  11:55 AM, 03/22/23

## 2023-03-25 ENCOUNTER — Other Ambulatory Visit: Payer: Self-pay

## 2023-03-25 MED ORDER — METHYLPHENIDATE HCL 10 MG PO TABS: 10.0000 mg | ORAL_TABLET | Freq: Every day | ORAL | 0 refills | Status: DC | PRN

## 2023-03-29 ENCOUNTER — Telehealth: Payer: Self-pay

## 2023-03-29 NOTE — Telephone Encounter (Signed)
Wife called and reports they will hold on getting renal ultrasound. Wife will call centralized scheduling to cancel renal ultrasound.

## 2023-03-30 ENCOUNTER — Encounter (HOSPITAL_COMMUNITY): Payer: Medicare Other | Admitting: Physical Therapy

## 2023-03-31 ENCOUNTER — Other Ambulatory Visit: Payer: Self-pay

## 2023-03-31 ENCOUNTER — Encounter (HOSPITAL_COMMUNITY)
Admission: RE | Admit: 2023-03-31 | Discharge: 2023-03-31 | Disposition: A | Discharge status: Home / Self Care | Payer: Medicare Other | Source: Ambulatory Visit | Attending: Hematology | Admitting: Hematology

## 2023-03-31 DIAGNOSIS — J90 Pleural effusion, not elsewhere classified: Secondary | ICD-10-CM | POA: Insufficient documentation

## 2023-03-31 DIAGNOSIS — C679 Malignant neoplasm of bladder, unspecified: Secondary | ICD-10-CM | POA: Insufficient documentation

## 2023-03-31 DIAGNOSIS — C799 Secondary malignant neoplasm of unspecified site: Secondary | ICD-10-CM | POA: Diagnosis not present

## 2023-03-31 MED ORDER — FLUDEOXYGLUCOSE F - 18 (FDG) INJECTION
8.0900 | Freq: Once | INTRAVENOUS | Status: AC | PRN
  Administered 2023-03-31: 8.09 via INTRAVENOUS

## 2023-04-04 ENCOUNTER — Ambulatory Visit (HOSPITAL_COMMUNITY): Admission: RE | Admit: 2023-04-04 | Payer: Medicare Other | Source: Ambulatory Visit

## 2023-04-05 NOTE — Progress Notes (Signed)
[facility] [address] [place] [address]    Clinic Day:  04/06/2023  Referring physician: Elfredia Nevins, MD  Patient Care Team: Elfredia Nevins, MD as PCP - General (Internal Medicine) Marinus Maw, MD as PCP - Electrophysiology (Cardiology) Doreatha Massed, MD as Medical Oncologist (Medical Oncology) Therese Sarah, RN as Oncology Nurse Navigator (Medical Oncology)   ASSESSMENT & PLAN:   Assessment: 1.  Metastatic urothelial carcinoma of the bladder to the lungs and bones: - Neoadjuvant chemotherapy with gemcitabine and cisplatin cycle 1 on 05/02/2017 - Cystoprostatectomy, bilateral lymphadenectomy by Dr. Berneice Heinrich on 06/29/2017, pathology T3b N0, 0/13 lymph nodes - Developed metastatic disease in April 2019, biopsy of the right ischium on 12/08/2017 with poorly differentiated carcinoma consistent with metastatic urothelial carcinoma. - XRT to the pelvis completed on 01/16/2018 - Carboplatin and gemcitabine 6 cycles from 01/30/2018 through 05/22/2018 - Pembrolizumab every 3 weeks started on 06/21/2018, discontinued in April 2020 due to skin toxicity - XRT to the right paratracheal lymph node, 50 Gray in 10 fractions completed on 04/23/2020 - XRT to the intrathoracic lymph node completed on 11/27/2021  - 12/20/2022: Right thoracentesis with 1.8 L removed - 12/21/2022: Left thoracentesis with 700 mL fluid removed, cytology negative. - Unclear etiology of recurrent effusions, exudative. - 2D echocardiogram: Normal LVEF with grade 1 diastolic dysfunction. - CT angiogram 12/20/2022: Negative for PE.  Multifocal parenchymal opacities.  No adenopathy. - Keytruda started on 01/07/2023.   2.  Social/family history: - Lives at home with his wife. - Worked for [employer].  Quit smoking 40 years ago. - No family history of malignancies.    Plan: 1.  Metastatic urothelial carcinoma of the bladder to the lungs and bones: - He has received 4 cycles of  Keytruda. - Reviewed PET scan from 03/31/2023: Lung lesions have completely resolved.  No evidence of metastatic disease. - He has significant right pleural effusion.  He has some cough and shortness of breath. - We will schedule him for thoracentesis next week. - Reviewed labs today: Normal LFTs.  Albumin 2.7.  CBC grossly normal.  TSH is 3.3. - Proceed with Keytruda today and in 3 weeks.  RTC 6 weeks for follow-up.   2.  Dry cough: - He is not using any cough medicine.  Hopefully thoracentesis will improve some of it.   3.  Loss of appetite/weight loss: - He is eating better and gained 1 pound.   4.  Immunotherapy induced skin rash: - Rash and itching has improved.  Continue prednisone 10 mg daily.  5.  Cancer-related fatigue: - Continue to use Ritalin 10 mg in the mornings as needed.  It is helping.    Orders Placed This Encounter  Procedures   US THORACENTESIS ASP PLEURAL SPACE W/IMG GUIDE    Standing Status:   Future    Standing Expiration Date:   04/05/2024    Order Specific Question:   Are labs required for specimen collection?    Answer:   No    Order Specific Question:   Reason for Exam (SYMPTOM  OR DIAGNOSIS REQUIRED)    Answer:   pleural effusion    Order Specific Question:   Preferred imaging location?    Answer:   [facility]      I,Katie Daubenspeck,acting as a scribe for Doreatha Massed, MD.,have documented all relevant documentation on the behalf of Doreatha Massed, MD,as directed by  Doreatha Massed, MD while in the presence of Doreatha Massed,  MD.   Margaret Pyle MD, have reviewed the above documentation for accuracy and completeness, and I agree with the above.   Doreatha Massed, MD   8/21/20245:41 PM  CHIEF COMPLAINT:   Diagnosis: Metastatic urothelial carcinoma of the bladder    Cancer Staging  No matching staging information was found for the patient.    Prior Therapy: 1. Neoadjuvant gemcitabine/cisplatin on  05/02/17 (one cycle) 2. Cystoprostatectomy and bilateral lymphadenectomy on 06/29/2017 (Dr. Berneice Heinrich) 3. Radiation therapy to the pelvic lesion 01/02/18 - 01/16/18 4. Carboplatin and gemcitabine, 6 cycles 02/06/18 - 05/22/18 5. Pembrolizumab 06/21/18 - 12/13/18 6. Radiation therapy to RUL and right paratracheal nodes 04/09/20 - 04/23/20 7. Radiation therapy to intrathoracic lymph nodes 11/16/21 - 11/27/21  Current Therapy:  pembrolizumab    HISTORY OF PRESENT ILLNESS:   Oncology History  Stage IV malignant neoplasm of urinary bladder /with mets to the bones and lungs  04/20/2017 Initial Diagnosis   Malignant neoplasm of urinary bladder (HCC)   01/30/2018 - 05/22/2018 Chemotherapy   The patient had palonosetron (ALOXI) injection 0.25 mg, 0.25 mg, Intravenous,  Once, 6 of 6 cycles Administration: 0.25 mg (01/30/2018), 0.25 mg (02/20/2018), 0.25 mg (03/13/2018), 0.25 mg (04/03/2018), 0.25 mg (05/01/2018), 0.25 mg (05/22/2018) CARBOplatin (PARAPLATIN) 400 mg in sodium chloride 0.9 % 250 mL chemo infusion, 400 mg (100 % of original dose 400 mg), Intravenous,  Once, 6 of 6 cycles Dose modification: 400 mg (original dose 400 mg, Cycle 1),   (original dose 400 mg, Cycle 2, Reason: Patient Age), 410 mg (original dose 400 mg, Cycle 6) Administration: 400 mg (01/30/2018), 400 mg (02/20/2018), 400 mg (03/13/2018), 410 mg (04/03/2018), 410 mg (05/01/2018), 410 mg (05/22/2018) gemcitabine (GEMZAR) 2,128 mg in sodium chloride 0.9 % 250 mL chemo infusion, 1,000 mg/m2 = 2,128 mg, Intravenous,  Once, 6 of 6 cycles Dose modification: 800 mg/m2 (original dose 1,000 mg/m2, Cycle 4, Reason: Provider Judgment), 800 mg/m2 (original dose 1,000 mg/m2, Cycle 4, Reason: Provider Judgment) Administration: 2,128 mg (01/30/2018), 2,128 mg (02/06/2018), 2,128 mg (02/20/2018), 2,128 mg (02/27/2018), 2,128 mg (03/13/2018), 1,710 mg (04/03/2018), 1,710 mg (04/10/2018), 1,710 mg (05/01/2018), 1,710 mg (05/22/2018) fosaprepitant (EMEND) 150 mg, dexamethasone (DECADRON)  12 mg in sodium chloride 0.9 % 145 mL IVPB, , Intravenous,  Once, 6 of 6 cycles Administration:  (01/30/2018),  (02/20/2018),  (03/13/2018),  (04/03/2018),  (05/01/2018),  (05/22/2018)  for chemotherapy treatment.    01/07/2023 -  Chemotherapy   Patient is on Treatment Plan : BLADDER Pembrolizumab (200) q21d     Bladder cancer (HCC)  06/28/2017 Initial Diagnosis   Bladder cancer (HCC)   06/21/2018 - 12/13/2018 Chemotherapy   The patient had pembrolizumab (KEYTRUDA) 200 mg in sodium chloride 0.9 % 50 mL chemo infusion, 200 mg, Intravenous, Once, 8 of 9 cycles Administration: 200 mg (06/21/2018), 200 mg (07/12/2018), 200 mg (08/02/2018), 200 mg (08/23/2018), 200 mg (09/13/2018), 200 mg (10/04/2018), 200 mg (11/22/2018), 200 mg (12/13/2018)  for chemotherapy treatment.       INTERVAL HISTORY:   [name] is a 82 y.o. male presenting to clinic today for follow up of Metastatic urothelial carcinoma of the bladder. He was last seen by me on 03/16/23.  Since his last visit, he underwent restaging PET scan on 03/31/23.   Today, he states that he is doing well overall. His appetite level is at 75%. His energy level is at 50%.  PAST MEDICAL HISTORY:   Past Medical History: Past Medical History:  Diagnosis Date   Bladder cancer (HCC)  Bone cancer (HCC)    BPH (benign prostatic hypertrophy)    Dysuria    GERD (gastroesophageal reflux disease)    History of adenomatous polyp of colon    tubular adenoma 06/ 2018   History of bladder cancer 12/2014   urologist-  dr Sherryl Barters--  dx High Grade TCC without stromal invasion s/p TURBT and intravesical BCG tx/  recurrent 07/2015 (pTa) TCC  repear intravesical BCG tx    History of hiatal hernia    History of urethral stricture    Nocturia     Surgical History: Past Surgical History:  Procedure Laterality Date   CARDIAC CATHETERIZATION  1997   normal coronary arteries   CARDIOVASCULAR STRESS TEST  12-14-2005   Low risk perfusion study/  very small scar in the  inferior septum from mid ventricle to apex,  no ischemia/  mild inferior septal hypokinesis/  ef 58%   CATARACT EXTRACTION W/ INTRAOCULAR LENS  IMPLANT, BILATERAL  2011   COLONOSCOPY  last one 06/ 2018   CYSTOSCOPY WITH BIOPSY N/A 08/12/2015   Procedure: CYSTOSCOPY WITH BIOPSY AND FULGERATION;  Surgeon: Hildred Laser, MD;  Location: [facility];  Service: Urology;  Laterality: N/A;   CYSTOSCOPY WITH INJECTION N/A 06/29/2017   Procedure: CYSTOSCOPY WITH INJECTION OF INDOCYANINE GREEN DYE;  Surgeon: Sebastian Ache, MD;  Location: [facility];  Service: Urology;  Laterality: N/A;   CYSTOSCOPY WITH URETHRAL DILATATION N/A 03/21/2017   Procedure: CYSTOSCOPY;  Surgeon: Hildred Laser, MD;  Location: [facility];  Service: Urology;  Laterality: N/A;   ESOPHAGOGASTRODUODENOSCOPY  12-05-2014   IR IMAGING GUIDED PORT INSERTION  02/07/2018   IR NEPHROSTOMY PLACEMENT LEFT  12/25/2021   IR REMOVAL TUN ACCESS W/ PORT W/O FL MOD SED  04/14/2018   TRANSTHORACIC ECHOCARDIOGRAM  01-31-2008   nomral LVF,  ef 55-65%/  mild pulmonic stenosis without regurg.,  peak transpulomonic valve gradient   TRANSURETHRAL RESECTION OF BLADDER TUMOR N/A 12/31/2014   Procedure: TRANSURETHRAL RESECTION OF BLADDER TUMOR (TURBT);  Surgeon: Su Grand, MD;  Location: [facility];  Service: Urology;  Laterality: N/A;   TRANSURETHRAL RESECTION OF BLADDER TUMOR N/A 03/21/2017   Procedure: POSSIBLE TRANSURETHRAL RESECTION OF BLADDER TUMOR (TURBT);  Surgeon: Hildred Laser, MD;  Location: [facility];  Service: Urology;  Laterality: N/A;    Social History: Social History   Socioeconomic History   Marital status: Married    Spouse name: Not on file   Number of children: 1   Years of education: Not on file   Highest education level: Not on file  Occupational History   Occupation: Retired  Tobacco Use   Smoking status: Former    Current packs/day: 0.00     Average packs/day: 1 pack/day for 34.0 years (34.0 ttl pk-yrs)    Types: Cigarettes    Start date: 12/27/1954    Quit date: 12/26/1988    Years since quitting: 34.2   Smokeless tobacco: Never  Vaping Use   Vaping status: Never Used  Substance and Sexual Activity   Alcohol use: Yes    Alcohol/week: 6.0 standard drinks of alcohol    Types: 6 Cans of beer per week    Comment: BEER   Drug use: No   Sexual activity: Not Currently  Other Topics Concern   Not on file  Social History Narrative   Not on file   Social Determinants of Health   Financial Resource Strain: Not on file  Food  Insecurity: No Food Insecurity (12/20/2022)   Hunger Vital Sign    Worried About Running Out of Food in the Last Year: Never true    Ran Out of Food in the Last Year: Never true  Transportation Needs: No Transportation Needs (12/20/2022)   PRAPARE - Administrator, Civil Service (Medical): No    Lack of Transportation (Non-Medical): No  Physical Activity: Not on file  Stress: Not on file  Social Connections: Unknown (12/28/2021)   Received from [facility]   Social Network    Social Network: Not on file  Intimate Partner Violence: Not At Risk (12/20/2022)   Humiliation, Afraid, Rape, and Kick questionnaire    Fear of Current or Ex-Partner: No    Emotionally Abused: No    Physically Abused: No    Sexually Abused: No    Family History: Family History  Problem Relation Age of Onset   Alcoholism Father    Colon cancer Neg Hx    Colon polyps Neg Hx    Diabetes Neg Hx    Kidney disease Neg Hx    Esophageal cancer Neg Hx    Heart disease Neg Hx    Gallbladder disease Neg Hx    Allergic rhinitis Neg Hx    Angioedema Neg Hx    Asthma Neg Hx    Atopy Neg Hx    Eczema Neg Hx    Immunodeficiency Neg Hx    Urticaria Neg Hx     Current Medications:  Current Outpatient Medications:    acetaminophen (TYLENOL) 500 MG tablet, Take 1,000 mg by mouth every 6 (six) hours as needed for mild  pain, fever, moderate pain or headache., Disp: , Rfl:    apixaban (ELIQUIS) 5 MG TABS tablet, Take 1 tablet (5 mg total) by mouth 2 (two) times daily., Disp: 60 tablet, Rfl: 2   betamethasone dipropionate 0.05 % cream, Apply topically 2 (two) times daily., Disp: 60 g, Rfl: 2   budesonide-formoterol (SYMBICORT) 160-4.5 MCG/ACT inhaler, Inhale 2 puffs into the lungs 2 (two) times daily., Disp: 1 each, Rfl: 6   chlorpheniramine-HYDROcodone (TUSSIONEX) 10-8 MG/5ML, Take 5 mLs by mouth every 12 (twelve) hours as needed (severe cough)., Disp: 70 mL, Rfl: 0   dextromethorphan-guaiFENesin (MUCINEX DM) 30-600 MG 12hr tablet, Take 1 tablet by mouth 2 (two) times daily., Disp: , Rfl:    hydrOXYzine (ATARAX) 25 MG tablet, Take 1 tablet (25 mg total) by mouth 3 (three) times daily as needed., Disp: 90 tablet, Rfl: 1   megestrol (MEGACE) 400 MG/10ML suspension, Take 10 mLs (400 mg total) by mouth 2 (two) times daily., Disp: 480 mL, Rfl: 3   methenamine (HIPREX) 1 g tablet, Take 1 g by mouth 2 (two) times daily., Disp: , Rfl:    methylphenidate (RITALIN) 10 MG tablet, Take 1 tablet (10 mg total) by mouth daily as needed., Disp: 30 tablet, Rfl: 0   metoprolol tartrate (LOPRESSOR) 25 MG tablet, Take 1 tablet (25 mg total) by mouth 2 (two) times daily., Disp: 60 tablet, Rfl: 2   pantoprazole (PROTONIX) 40 MG tablet, Take 1 tablet (40 mg total) by mouth 2 (two) times daily., Disp: 60 tablet, Rfl: 0   predniSONE (DELTASONE) 10 MG tablet, Take 1 tablet (10 mg total) by mouth daily with breakfast., Disp: 30 tablet, Rfl: 4  Current Facility-Administered Medications:    omalizumab Geoffry Paradise) prefilled syringe 300 mg, 300 mg, Subcutaneous, Q14 Days, Alfonse Spruce, MD, 300 mg at 11/24/22 1610  Facility-Administered Medications Ordered  in Other Visits:    heparin lock flush 100 unit/mL, 500 Units, Intracatheter, Once PRN, Doreatha Massed, MD   sodium chloride flush (NS) 0.9 % injection 10 mL, 10 mL,  Intracatheter, PRN, Doreatha Massed, MD   Allergies: Allergies  Allergen Reactions   Ciprofloxacin Hives   Zofran [Ondansetron] Rash    REVIEW OF SYSTEMS:   Review of Systems  Constitutional:  Negative for chills, fatigue and fever.  HENT:   Negative for lump/mass, mouth sores, nosebleeds, sore throat and trouble swallowing.   Eyes:  Negative for eye problems.  Respiratory:  Positive for cough and shortness of breath.   Cardiovascular:  Negative for chest pain, leg swelling and palpitations.  Gastrointestinal:  Negative for abdominal pain, constipation, diarrhea, nausea and vomiting.  Genitourinary:  Negative for bladder incontinence, difficulty urinating, dysuria, frequency, hematuria and nocturia.   Musculoskeletal:  Negative for arthralgias, back pain, flank pain, myalgias and neck pain.  Skin:  Negative for itching and rash.  Neurological:  Negative for dizziness, headaches and numbness.  Hematological:  Does not bruise/bleed easily.  Psychiatric/Behavioral:  Negative for depression, sleep disturbance and suicidal ideas. The patient is not nervous/anxious.   All other systems reviewed and are negative.    VITALS:   Blood pressure 123/83, pulse 65, temperature (!) 96.4 F (35.8 C), temperature source Tympanic, resp. rate 19, height 5\' 11"  (1.803 m), weight 174 lb 4 oz (79 kg), SpO2 99%.  Wt Readings from Last 3 Encounters:  04/06/23 174 lb 4 oz (79 kg)  03/16/23 173 lb (78.5 kg)  02/22/23 170 lb 4.8 oz (77.2 kg)    Body mass index is 24.3 kg/m.  Performance status (ECOG): 1 - Symptomatic but completely ambulatory  PHYSICAL EXAM:   Physical Exam Vitals and nursing note reviewed. Exam conducted with a chaperone present.  Constitutional:      Appearance: Normal appearance.  Cardiovascular:     Rate and Rhythm: Normal rate and regular rhythm.     Pulses: Normal pulses.     Heart sounds: Normal heart sounds.  Pulmonary:     Effort: Pulmonary effort is normal.      Breath sounds: Normal breath sounds.     Comments: Decreased breath sounds in the right one third of the lung. Abdominal:     Palpations: Abdomen is soft. There is no hepatomegaly, splenomegaly or mass.     Tenderness: There is no abdominal tenderness.  Musculoskeletal:     Right lower leg: No edema.     Left lower leg: No edema.  Lymphadenopathy:     Cervical: No cervical adenopathy.     Right cervical: No superficial, deep or posterior cervical adenopathy.    Left cervical: No superficial, deep or posterior cervical adenopathy.     Upper Body:     Right upper body: No supraclavicular or axillary adenopathy.     Left upper body: No supraclavicular or axillary adenopathy.  Neurological:     General: No focal deficit present.     Mental Status: He is alert and oriented to person, place, and time.  Psychiatric:        Mood and Affect: Mood normal.        Behavior: Behavior normal.     LABS:      Latest Ref Rng & Units 04/06/2023   12:14 PM 03/16/2023   12:25 PM 02/22/2023   12:22 PM  CBC  WBC 4.0 - 10.5 K/uL 9.5  9.1  14.8   Hemoglobin 13.0 - 17.0  g/dL 16.1  09.6  04.5   Hematocrit 39.0 - 52.0 % 42.3  44.0  45.5   Platelets 150 - 400 K/uL 212  270  210       Latest Ref Rng & Units 04/06/2023   12:14 PM 03/16/2023   12:25 PM 02/22/2023   12:22 PM  CMP  Glucose 70 - 99 mg/dL 409  811  914   BUN 8 - 23 mg/dL 14  15  18    Creatinine 0.61 - 1.24 mg/dL 7.82  9.56  2.13   Sodium 135 - 145 mmol/L 139  139  138   Potassium 3.5 - 5.1 mmol/L 3.7  3.9  3.7   Chloride 98 - 111 mmol/L 106  107  108   CO2 22 - 32 mmol/L 25  25  20    Calcium 8.9 - 10.3 mg/dL 8.4  8.6  8.4   Total Protein 6.5 - 8.1 g/dL 5.2  5.5  5.9   Total Bilirubin 0.3 - 1.2 mg/dL 0.4  0.6  0.9   Alkaline Phos 38 - 126 U/L 47  61  49   AST 15 - 41 U/L 14  13  20    ALT 0 - 44 U/L 17  17  37      No results found for: "CEA1", "CEA" / No results found for: "CEA1", "CEA" No results found for: "PSA1" No results  found for: "YQM578" No results found for: "CAN125"  No results found for: "TOTALPROTELP", "ALBUMINELP", "A1GS", "A2GS", "BETS", "BETA2SER", "GAMS", "MSPIKE", "SPEI" Lab Results  Component Value Date   TIBC 249 03/20/2018   FERRITIN 759 (H) 03/20/2018   IRONPCTSAT 20 (L) 03/20/2018   Lab Results  Component Value Date   LDH 96 (L) 06/21/2022     STUDIES:   NM PET Image Restag (PS) Skull Base To Thigh  Result Date: 04/06/2023 CLINICAL DATA:  Subsequent treatment strategy for bladder carcinoma. EXAM: NUCLEAR MEDICINE PET SKULL BASE TO THIGH TECHNIQUE: 8.1 mCi F-18 FDG was injected intravenously. Full-ring PET imaging was performed from the skull base to thigh after the radiotracer. CT data was obtained and used for attenuation correction and anatomic localization. Fasting blood glucose: 95 mg/dl COMPARISON:  PET-CT scan 11/25/2022 FINDINGS: Mediastinal blood pool activity: SUV max Liver activity: SUV max NA NECK: No hypermetabolic lymph nodes in the neck. Incidental CT findings: None. CHEST: Bilateral pleural effusions. Large effusion on the RIGHT and moderate effusion LEFT. Pattern similar to prior comparison PET-CT scan. No suspicious pulmonary nodules. No hypermetabolic mediastinal lymph nodes. Incidental CT findings: None. ABDOMEN/PELVIS: Post cystectomy. No abnormal metabolic tissue in the pelvis. It conduit anatomy. No evidence obstruction. Low-density lesions in the liver without metabolic activity consistent with benign cysts. No lymphadenopathy. Incidental CT findings: None. SKELETON: Large expansile lesion within the RIGHT inferior pubic ramus not changed from prior. No metabolic activity. Reported treated metastatic lesion. Incidental CT findings: None. IMPRESSION: 1. No evidence of bladder cancer recurrence in the pelvis. 2. No evidence metastatic adenopathy in the abdomen pelvis. 3. Stable treated metastatic lesion in the RIGHT inferior pubic ramus. 4. Stable bilateral pleural effusions.  5. No interval change.  No evidence metastatic disease. Electronically Signed   By: Genevive Bi M.D.   On: 04/06/2023 11:10   DG Chest 2 View  Result Date: 03/23/2023 CLINICAL DATA:  History of pleural effusion and shortness of breath EXAM: CHEST - 2 VIEW COMPARISON:  Multiple chest x-rays, most recently February 15, 2023 FINDINGS: The cardiomediastinal silhouette is unchanged  in contour. No focal pulmonary opacity. Decreased, now small right pleural effusion. Persistent elevation of the right hemidiaphragm. No pneumothorax. The visualized upper abdomen is unremarkable. No acute osseous abnormality. IMPRESSION: Decreased small right pleural effusion. Electronically Signed   By: Jacob Moores M.D.   On: 03/23/2023 08:21

## 2023-04-06 ENCOUNTER — Inpatient Hospital Stay: Payer: Medicare Other

## 2023-04-06 ENCOUNTER — Inpatient Hospital Stay (HOSPITAL_BASED_OUTPATIENT_CLINIC_OR_DEPARTMENT_OTHER): Payer: Medicare Other | Admitting: Hematology

## 2023-04-06 ENCOUNTER — Inpatient Hospital Stay: Payer: Medicare Other | Attending: Oncology

## 2023-04-06 VITALS — BP 123/83 | HR 65 | Temp 96.4°F | Resp 19 | Ht 71.0 in | Wt 174.2 lb

## 2023-04-06 VITALS — BP 111/57 | HR 63 | Temp 96.7°F | Resp 18

## 2023-04-06 DIAGNOSIS — Z5112 Encounter for antineoplastic immunotherapy: Secondary | ICD-10-CM | POA: Diagnosis not present

## 2023-04-06 DIAGNOSIS — C7802 Secondary malignant neoplasm of left lung: Secondary | ICD-10-CM | POA: Insufficient documentation

## 2023-04-06 DIAGNOSIS — J90 Pleural effusion, not elsewhere classified: Secondary | ICD-10-CM | POA: Diagnosis not present

## 2023-04-06 DIAGNOSIS — C679 Malignant neoplasm of bladder, unspecified: Secondary | ICD-10-CM

## 2023-04-06 DIAGNOSIS — C7951 Secondary malignant neoplasm of bone: Secondary | ICD-10-CM | POA: Insufficient documentation

## 2023-04-06 DIAGNOSIS — C7801 Secondary malignant neoplasm of right lung: Secondary | ICD-10-CM | POA: Diagnosis not present

## 2023-04-06 LAB — CBC WITH DIFFERENTIAL/PLATELET
Abs Immature Granulocytes: 0.08 10*3/uL — ABNORMAL HIGH (ref 0.00–0.07)
Basophils Absolute: 0.1 10*3/uL (ref 0.0–0.1)
Basophils Relative: 1 %
Eosinophils Absolute: 0.2 10*3/uL (ref 0.0–0.5)
Eosinophils Relative: 2 %
HCT: 42.3 % (ref 39.0–52.0)
Hemoglobin: 13.6 g/dL (ref 13.0–17.0)
Immature Granulocytes: 1 %
Lymphocytes Relative: 8 %
Lymphs Abs: 0.8 10*3/uL (ref 0.7–4.0)
MCH: 36.3 pg — ABNORMAL HIGH (ref 26.0–34.0)
MCHC: 32.2 g/dL (ref 30.0–36.0)
MCV: 112.8 fL — ABNORMAL HIGH (ref 80.0–100.0)
Monocytes Absolute: 0.7 10*3/uL (ref 0.1–1.0)
Monocytes Relative: 7 %
Neutro Abs: 7.8 10*3/uL — ABNORMAL HIGH (ref 1.7–7.7)
Neutrophils Relative %: 81 %
Platelets: 212 10*3/uL (ref 150–400)
RBC: 3.75 MIL/uL — ABNORMAL LOW (ref 4.22–5.81)
RDW: 13.9 % (ref 11.5–15.5)
WBC: 9.5 10*3/uL (ref 4.0–10.5)
nRBC: 0 % (ref 0.0–0.2)

## 2023-04-06 LAB — COMPREHENSIVE METABOLIC PANEL
ALT: 17 U/L (ref 0–44)
AST: 14 U/L — ABNORMAL LOW (ref 15–41)
Albumin: 2.7 g/dL — ABNORMAL LOW (ref 3.5–5.0)
Alkaline Phosphatase: 47 U/L (ref 38–126)
Anion gap: 8 (ref 5–15)
BUN: 14 mg/dL (ref 8–23)
CO2: 25 mmol/L (ref 22–32)
Calcium: 8.4 mg/dL — ABNORMAL LOW (ref 8.9–10.3)
Chloride: 106 mmol/L (ref 98–111)
Creatinine, Ser: 0.98 mg/dL (ref 0.61–1.24)
GFR, Estimated: >60 mL/min (ref >60)
Glucose, Bld: 110 mg/dL — ABNORMAL HIGH (ref 70–99)
Potassium: 3.7 mmol/L (ref 3.5–5.1)
Sodium: 139 mmol/L (ref 135–145)
Total Bilirubin: 0.4 mg/dL (ref 0.3–1.2)
Total Protein: 5.2 g/dL — ABNORMAL LOW (ref 6.5–8.1)

## 2023-04-06 LAB — MAGNESIUM: Magnesium: 2.2 mg/dL (ref 1.7–2.4)

## 2023-04-06 MED ORDER — HEPARIN SOD (PORK) LOCK FLUSH 100 UNIT/ML IV SOLN: 500.0000 [IU] | Freq: Once | INTRAVENOUS | Status: DC | PRN

## 2023-04-06 MED ORDER — SODIUM CHLORIDE 0.9% FLUSH: 10.0000 mL | INTRAVENOUS | Status: DC | PRN

## 2023-04-06 MED ORDER — SODIUM CHLORIDE 0.9 % IV SOLN
200.0000 mg | Freq: Once | INTRAVENOUS | Status: AC
  Administered 2023-04-06: 200 mg via INTRAVENOUS
  Filled 2023-04-06: qty 8

## 2023-04-06 MED ORDER — SODIUM CHLORIDE 0.9 % IV SOLN: Freq: Once | INTRAVENOUS | Status: AC

## 2023-04-06 NOTE — Progress Notes (Signed)
Patient presents today for Keytruda infusion per providers order.  Vital signs and labs reviewed by MD.  Message received from Allison Anderson RN/Dr. Katragadda patient okay for treatment.  Treatment given today per MD orders.  Stable during infusion without adverse affects.  Vital signs stable.  No complaints at this time.  Discharge from clinic ambulatory in stable condition.  Alert and oriented X 3.  Follow up with  Cancer Center as scheduled.  

## 2023-04-06 NOTE — Patient Instructions (Addendum)
[facility] at [facility] Discharge Instructions   You were seen and examined today by Dr. Ellin Saba.  He reviewed the results of your lab work which are normal/stable.   He reviewed the results of your PET scan. It is showing improvement in the cancer in the lungs. There is no evidence of cancer in the bladder.   We will proceed with your treatment today.   Return as scheduled.    Thank you for choosing [facility] at [facility] to provide your oncology and hematology care.  To afford each patient quality time with our provider, please arrive at least 15 minutes before your scheduled appointment time.   If you have a lab appointment with the Cancer Center please come in thru the Main Entrance and check in at the main information desk.  You need to re-schedule your appointment should you arrive 10 or more minutes late.  We strive to give you quality time with our providers, and arriving late affects you and other patients whose appointments are after yours.  Also, if you no show three or more times for appointments you may be dismissed from the clinic at the providers discretion.     Again, thank you for choosing [facility].  Our hope is that these requests will decrease the amount of time that you wait before being seen by our physicians.       _____________________________________________________________  Should you have questions after your visit to [facility], please contact our office at [phone] and follow the prompts.  Our office hours are 8:00 a.m. and 4:30 p.m. Monday - Friday.  Please note that voicemails left after 4:00 p.m. may not be returned until the following business day.  We are closed weekends and major holidays.  You do have access to a nurse 24-7, just call the main number to the clinic [phone] and do not press any options, hold on the line and a nurse will answer the phone.    For  prescription refill requests, have your pharmacy contact our office and allow 72 hours.    Due to Covid, you will need to wear a mask upon entering the hospital. If you do not have a mask, a mask will be given to you at the Main Entrance upon arrival. For doctor visits, patients may have 1 support person age 45 or older with them. For treatment visits, patients can not have anyone with them due to social distancing guidelines and our immunocompromised population.

## 2023-04-06 NOTE — Patient Instructions (Signed)
[facility]  Discharge Instructions: Thank you for choosing [facility] to provide your oncology and hematology care.  If you have a lab appointment with the Cancer Center - please note that after April 8th, 2024, all labs will be drawn in the cancer center.  You do not have to check in or register with the main entrance as you have in the past but will complete your check-in in the cancer center.  Wear comfortable clothing and clothing appropriate for easy access to any Portacath or PICC line.   We strive to give you quality time with your provider. You may need to reschedule your appointment if you arrive late (15 or more minutes).  Arriving late affects you and other patients whose appointments are after yours.  Also, if you miss three or more appointments without notifying the office, you may be dismissed from the clinic at the provider's discretion.      For prescription refill requests, have your pharmacy contact our office and allow 72 hours for refills to be completed.    Today you received the following chemotherapy and/or immunotherapy agents Keytruda      To help prevent nausea and vomiting after your treatment, we encourage you to take your nausea medication as directed.  BELOW ARE SYMPTOMS THAT SHOULD BE REPORTED IMMEDIATELY: *FEVER GREATER THAN 100.4 F (38 C) OR HIGHER *CHILLS OR SWEATING *NAUSEA AND VOMITING THAT IS NOT CONTROLLED WITH YOUR NAUSEA MEDICATION *UNUSUAL SHORTNESS OF BREATH *UNUSUAL BRUISING OR BLEEDING *URINARY PROBLEMS (pain or burning when urinating, or frequent urination) *BOWEL PROBLEMS (unusual diarrhea, constipation, pain near the anus) TENDERNESS IN MOUTH AND THROAT WITH OR WITHOUT PRESENCE OF ULCERS (sore throat, sores in mouth, or a toothache) UNUSUAL RASH, SWELLING OR PAIN  UNUSUAL VAGINAL DISCHARGE OR ITCHING   Items with * indicate a potential emergency and should be followed up as soon as possible or go to the  Emergency Department if any problems should occur.  Please show the CHEMOTHERAPY ALERT CARD or IMMUNOTHERAPY ALERT CARD at check-in to the Emergency Department and triage nurse.  Should you have questions after your visit or need to cancel or reschedule your appointment, please contact [facility] [phone]  and follow the prompts.  Office hours are 8:00 a.m. to 4:30 p.m. Monday - Friday. Please note that voicemails left after 4:00 p.m. may not be returned until the following business day.  We are closed weekends and major holidays. You have access to a nurse at all times for urgent questions. Please call the main number to the clinic [phone] and follow the prompts.  For any non-urgent questions, you may also contact your provider using MyChart. We now offer e-Visits for anyone 18 and older to request care online for non-urgent symptoms. For details visit [email or web address].   Also download the MyChart app! Go to the app store, search "MyChart", open the app, select [place], and log in with your MyChart username and password.   

## 2023-04-06 NOTE — Progress Notes (Signed)
Patient has been examined by Dr. Katragadda. Vital signs and labs have been reviewed by MD - ANC, Creatinine, LFTs, hemoglobin, and platelets are within treatment parameters per M.D. - pt may proceed with treatment.  Primary RN and pharmacy notified.  

## 2023-04-07 ENCOUNTER — Ambulatory Visit (HOSPITAL_COMMUNITY): Payer: Medicare Other | Admitting: Physical Therapy

## 2023-04-07 ENCOUNTER — Encounter (HOSPITAL_COMMUNITY): Payer: Self-pay | Admitting: Physical Therapy

## 2023-04-07 DIAGNOSIS — M6281 Muscle weakness (generalized): Secondary | ICD-10-CM | POA: Diagnosis not present

## 2023-04-07 DIAGNOSIS — R262 Difficulty in walking, not elsewhere classified: Secondary | ICD-10-CM | POA: Diagnosis not present

## 2023-04-07 NOTE — Therapy (Signed)
OUTPATIENT PHYSICAL THERAPY TREATMENT   Patient Name: [name] MRN: [id number] DOB:12-Sep-1940, 82 y.o., male Today's Date: 04/07/2023  PHYSICAL THERAPY DISCHARGE SUMMARY  Visits from Start of Care: 7  Current functional level related to goals / functional outcomes: See below   Remaining deficits: See below   Education / Equipment: See below   Patient agrees to discharge. Patient goals were met. Patient is being discharged due to meeting the stated rehab goals.   END OF SESSION:   PT End of Session - 04/07/23 0901     Visit Number 7    Number of Visits 8    Date for PT Re-Evaluation 04/06/23    Authorization Type medicare A    Progress Note Due on Visit 10    PT Start Time 0901    PT Stop Time 0933    PT Time Calculation (min) 32 min    Activity Tolerance Patient tolerated treatment well;Patient limited by fatigue    Behavior During Therapy [facility] for tasks assessed/performed                       Past Medical History:  Diagnosis Date   Bladder cancer (HCC)    Bone cancer (HCC)    BPH (benign prostatic hypertrophy)    Dysuria    GERD (gastroesophageal reflux disease)    History of adenomatous polyp of colon    tubular adenoma 06/ 2018   History of bladder cancer 12/2014   urologist-  dr Sherryl Barters--  dx High Grade TCC without stromal invasion s/p TURBT and intravesical BCG tx/  recurrent 07/2015 (pTa) TCC  repear intravesical BCG tx    History of hiatal hernia    History of urethral stricture    Nocturia    Past Surgical History:  Procedure Laterality Date   CARDIAC CATHETERIZATION  1997   normal coronary arteries   CARDIOVASCULAR STRESS TEST  12-14-2005   Low risk perfusion study/  very small scar in the inferior septum from mid ventricle to apex,  no ischemia/  mild inferior septal hypokinesis/  ef 58%   CATARACT EXTRACTION W/ INTRAOCULAR LENS  IMPLANT, BILATERAL  2011   COLONOSCOPY  last one 06/ 2018   CYSTOSCOPY WITH BIOPSY N/A 08/12/2015    Procedure: CYSTOSCOPY WITH BIOPSY AND FULGERATION;  Surgeon: Hildred Laser, MD;  Location: [facility];  Service: Urology;  Laterality: N/A;   CYSTOSCOPY WITH INJECTION N/A 06/29/2017   Procedure: CYSTOSCOPY WITH INJECTION OF INDOCYANINE GREEN DYE;  Surgeon: Sebastian Ache, MD;  Location: [facility];  Service: Urology;  Laterality: N/A;   CYSTOSCOPY WITH URETHRAL DILATATION N/A 03/21/2017   Procedure: CYSTOSCOPY;  Surgeon: Hildred Laser, MD;  [facility];  Service: Urology;  Laterality: N/A;   ESOPHAGOGASTRODUODENOSCOPY  12-05-2014   IR IMAGING GUIDED PORT INSERTION  02/07/2018   IR NEPHROSTOMY PLACEMENT LEFT  12/25/2021   IR REMOVAL TUN ACCESS W/ PORT W/O FL MOD SED  04/14/2018   TRANSTHORACIC ECHOCARDIOGRAM  01-31-2008   nomral LVF,  ef 55-65%/  mild pulmonic stenosis without regurg.,  peak transpulomonic valve gradient   TRANSURETHRAL RESECTION OF BLADDER TUMOR N/A 12/31/2014   Procedure: TRANSURETHRAL RESECTION OF BLADDER TUMOR (TURBT);  Surgeon: Su Grand, MD;  Location: [facility];  Service: Urology;  Laterality: N/A;   TRANSURETHRAL RESECTION OF BLADDER TUMOR N/A 03/21/2017   Procedure: POSSIBLE TRANSURETHRAL RESECTION OF BLADDER TUMOR (TURBT);  Surgeon: Hildred Laser, MD;  Location:  [facility];  Service: Urology;  Laterality: N/A;   Patient Active Problem List   Diagnosis Date Noted   Acute hypoxemic respiratory failure (HCC) 12/23/2022   CAP (community acquired pneumonia) 12/20/2022   Chronic idiopathic urticaria 10/14/2022   Rash 10/14/2022   Venous stasis 10/14/2022   Generalized weakness 06/21/2022   Pleural effusion 06/21/2022   Paroxysmal atrial fibrillation (HCC) 04/14/2022   C. 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 (HCC) 10/30/2021   Sepsis secondary to urinary source 07/13/2020   Pseudomonas aeruginosa infection  06/18/2020   GERD (gastroesophageal reflux disease)    Sinus tachycardia    Fever    Dehydration    Rigors    Severe sepsis (HCC)    Elevated lactic acid level    Bladder cancer metastasized to lung (HCC) 03/31/2020   Adverse drug reaction 06/05/2018   Bacteremia associated with intravascular line (HCC) 04/20/2018   Febrile neutropenia (HCC) 04/13/2018   Gram positive sepsis (HCC) 04/13/2018   SIRS (systemic inflammatory response syndrome) (HCC) 04/12/2018   Bacteremia due to Gram-positive bacteria 04/12/2018   Pancytopenia (HCC) 04/12/2018   Status post chemotherapy    UTI (urinary tract infection) 04/11/2018   Port-A-Cath in place 02/20/2018   Encounter for antineoplastic chemotherapy 02/17/2018   Sepsis due to urinary tract infection (HCC) 01/25/2018   Sepsis secondary to UTI (HCC) 01/25/2018   Hyperglycemia    Goals of care, counseling/discussion 01/18/2018   Bone metastasis 12/29/2017   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  Patient states he feels like he has made some progress. Difficulty walking/endurance and he gets out of breath. Getting fluid removed. Has been doing  HEP a little bit but not a lot. Patient states about 50% improvement since beginning PT. He feels like he can do it at home.   Evaluation: Mr. Esbenshade states that since his diagnosis he feels as if he runs out of breath and energy.  The pt  has been using a walker/wheelchair  if he goes to any MD appointments.    PERTINENT HISTORY: Per hospitalist at discharge from hospital on 12/20/22, (pt hospitalized on 5/6)Dionysios Reinbold  is an 82 y.o. male, with history of stage IV bladder cancer with metastasis to bones and lungs, s/p cystoprostatectomy, with lower quadrant ileal conduit formation, diverticulosis, atrial fibrillation with anticoagulation on apixaban, GERD who came to ED with generalized weakness, persistent shortness of breath and poor appetite.  Patient was seen in oncology clinic this morning where he underwent right sided thoracentesis ordered by oncologist Dr. Ellin Saba.  1.8 L fluid was removed from right pleural effusion.  Patient is scheduled to undergo left thoracentesis tomorrow.  Recently completed course of Augmentin without any improvement.  He was admitted with acute hypoxemic respiratory failure-multifactorial from bilateral pleural effusion with associated multi focal pneumonia.  He underwent left-sided thoracentesis with 700 mL of clear yellow fluid drained 5/7. This was again exudative by light's  criteria with negative gram stain, no culture growth, negative cytology PAIN:  Are you having pain? No  PRECAUTIONS: Fall  WEIGHT BEARING RESTRICTIONS: No  FALLS:  Has patient fallen in last 6 months? No  LIVING ENVIRONMENT: Lives with: lives with their family Lives in: House/apartment Stairs: Yes: Internal: 8 steps; Pt has a lift chair and External: 1 steps; none Has following equipment at home: Single point cane and Walker - 2 wheeled  OCCUPATION: retired  PLOF:  Pt energy level has been down for the past 6 months prior to this he was able to walk where he wanted.  PATIENT GOALS: walk better.   NEXT MD VISIT: July 9th   OBJECTIVE:   COGNITION: Overall cognitive status: Within functional limits for tasks assessed     LOWER EXTREMITY MMT:  MMT Right eval Left eval Right 04/07/23 Left 04/07/23  Hip  flexion 3 3 4+ 4+  Hip extension      Hip abduction 3- 3    Hip adduction      Hip internal rotation      Hip external rotation      Knee flexion   5 5  Knee extension 4 5 5 5   Ankle dorsiflexion      Ankle plantarflexion      Ankle inversion      Ankle eversion       (Blank rows = not tested) FUNCTIONAL TESTS:  30 seconds chair stand test: using UE: 8 ; 6 is poor and 9 is below average 2 minute walk test: 226 ft pt SOB    Reassessment 04/07/23 FUNCTIONAL TESTS:  30 seconds chair stand test: using UE: 10 ; 6 is poor and 9 is below average 2 minute walk test: 370 feet; RPE 6/10 500 feet in 2:41; RPE 7/10  TODAY'S TREATMENT:                                                                                                                              DATE:  04/07/23 Reassessment 2 minute walk test: 370 feet; RPE 6/10 30 seconds chair stand test: using UE: 10 reps 500 feet in 2:41; RPE 7/10 Review of HEP and education   03/22/23 2 MWT:  341 ft; total walking 391 ft  Tandem stance x 5 with both rt and Lt in front  Side step at counter x 2 RT 6" step up x 10 B  Heel raise x 15 Squat x 10 Lunge x 10 March x 10   03/15/23 Nustep, seat 9,  level 3 for dynamic warm up and conditioning Step up 6 inch 1 x 10 bilateral, RPE 8/ 10 : able to ambulate 226 feet in 1:14 requiring rest break following due to increased coughing/fatigue; 340 feet,  RPE 8-9/10 Standing hip abduction 2 x 10 bilateral  Tandem stance 2 x 30 second holds  03/08/23 Nustep, seat 9,  level 3 for dynamic warm up and conditioning Step taps 4  inch 2 x 10 bilateral  : 325 feet,  RPE 5/ 10 SLS with vectors 5 x 5 second holds bilateral 2 HHA Step up 4  1 x 10 bilateral; RPE 5/ 10 Lateral stepping 4 x 10 feet bilateral  STS 2 x 5 with blue foam on seat, RPE 5/ 10  03/01/23 Nustep level 2  Marching x 10  Sit to stand x 5 reps x 2 set  Heel raise x 10 Minisquat x 10   Hip extension x 10  Side stepping x 2  Reps  2 minute walk test 226 ft. Forward lunge x 5 B   02/14/23 Sit to stands 10X with bil UE  295 no AD; SOB Diaphragmatic breathing Standing:  heelraises 10X  Hip abduction 10X  Hip extension 10X Supine:  Bridge 10X  SLR 10X each Sidelying hip abduction 10X each  02/09/23:  Eval Diaphragmatic breathing LAQ Rt x 10 Sit to stand x 5     PATIENT EDUCATION:  Education details: HEP, increasing reps at home, progressing walking/exercise as able, returning to PT if needed Person educated: Patient and Spouse Education method: Explanation Education comprehension: returned demonstration  HOME EXERCISE PROGRAM: Access Code: [email or web address] URL: [email or web address]  04/07/23- Step Up  - 1 x daily - 7 x weekly - 2 sets - 10 reps - Side Stepping with Counter Support  - 1 x daily - 7 x weekly - 3 sets - 10 reps  03/22/23 - Side Stepping with Counter Support  - 1 x daily - 7 x weekly - 1 sets - 3 reps - Walking with Counter Support  - 1 x daily - 7 x weekly - 1 sets - 5 reps Date: 02/14/2023 Prepared by: Emeline Gins Exercises - Supine Bridge  - 1 x daily - 7 x weekly - 1 sets - 10 reps - Small Range Straight Leg Raise  - 1 x daily - 7 x weekly - 1 sets - 10 reps - Sidelying Hip Abduction  - 1 x daily - 7 x weekly - 1 sets - 10 reps - Standing Heel Raise with Support  - 1 x daily - 7 x weekly - 1 sets - 10 reps - Standing Hip Abduction with Counter Support  - 1 x daily - 7 x weekly - 1 sets - 10 reps - Standing Hip Extension with Counter Support  - 1 x daily - 7 x weekly - 1 sets - 10 reps   Date: 02/09/2023 Prepared by: Virgina Organ Exercises - Seated Long Arc Quad  - 3 x daily - 7 x weekly - 1 sets - 10 reps - 5" hold - Sit to Stand with Counter Support  - 1 x daily - 7 x weekly - 1 sets - 10 reps - Seated Diaphragmatic Breathing  - 1 x daily - 7 x weekly - 1 sets - 10 reps  ASSESSMENT:  CLINICAL IMPRESSION: Patient has met 1/1 short term goals and 1/2 long  term goals with ability to complete HEP and improvement in symptoms, strength, ROM, activity tolerance, gait, balance, and functional mobility. Remaining goals not met due to continued deficits in functional mobility, strength, endurance. Patient has made good progress toward remaining goals with 2 additional STS in 30 seconds vs evaluation. Patient wishes to transition to self management with HEP. Patient will continue to benefit from skilled physical therapy in order to improve function and reduce impairment.    OBJECTIVE IMPAIRMENTS: decreased activity tolerance, decreased balance, and decreased strength.  ACTIVITY LIMITATIONS: carrying, lifting, standing, squatting, stairs, and locomotion level  PARTICIPATION LIMITATIONS: cleaning, shopping, community activity, and yard work  PERSONAL FACTORS: Fitness and 1-2 comorbidities: active cancer, COPD  are also affecting patient's functional outcome.   REHAB POTENTIAL: Fair    CLINICAL DECISION MAKING: Evolving/moderate complexity  EVALUATION COMPLEXITY: Moderate   GOALS: Goals reviewed with patient? No  SHORT TERM GOALS: Target date: 03/09/23 PT to be I in HEP in order to be able to walk for 325 ft without being SOB Baseline: Goal status: met  LONG TERM GOALS: Target date: 04/06/23  PT to be I in advanced HEP in order to be able to walk 500 ft to be able to walk to  MD visits. Baseline:  Goal status: MET  2.  PT LE strength to improve to be able to complete 12 sit to stand in 30 seconds. For decreased risk of falls  Baseline: 10 reps in 30 seconds with UE use Goal status: on-going   PLAN:  PT FREQUENCY: 1x/week  PT DURATION: 8 weeks  PLANNED INTERVENTIONS: Therapeutic exercises, Therapeutic activity, Balance training, Gait training, Patient/Family education, and Self Care  PLAN FOR NEXT SESSION:n/a   Wyman Songster, PT 04/07/2023, 9:35 AM

## 2023-04-08 ENCOUNTER — Ambulatory Visit (INDEPENDENT_AMBULATORY_CARE_PROVIDER_SITE_OTHER): Payer: Medicare Other | Admitting: Urology

## 2023-04-08 VITALS — BP 116/79 | HR 86

## 2023-04-08 DIAGNOSIS — Z09 Encounter for follow-up examination after completed treatment for conditions other than malignant neoplasm: Secondary | ICD-10-CM

## 2023-04-08 DIAGNOSIS — Z87440 Personal history of urinary (tract) infections: Secondary | ICD-10-CM

## 2023-04-08 DIAGNOSIS — C679 Malignant neoplasm of bladder, unspecified: Secondary | ICD-10-CM

## 2023-04-08 DIAGNOSIS — N390 Urinary tract infection, site not specified: Secondary | ICD-10-CM

## 2023-04-08 MED ORDER — METHENAMINE HIPPURATE 1 G PO TABS: 1.0000 g | ORAL_TABLET | Freq: Two times a day (BID) | ORAL | 11 refills | Status: DC

## 2023-04-08 NOTE — Progress Notes (Unsigned)
04/08/2023 9:26 AM   [name] Mar 10, 1941 [id number]  Referring provider: Elfredia Nevins, MD [address] [place],  [place] [address]  No chief complaint on file.   HPI: No UTI since last visit. PET shows no evidence of metastatic disease.    PMH: Past Medical History:  Diagnosis Date   Bladder cancer (HCC)    Bone cancer (HCC)    BPH (benign prostatic hypertrophy)    Dysuria    GERD (gastroesophageal reflux disease)    History of adenomatous polyp of colon    tubular adenoma 06/ 2018   History of bladder cancer 12/2014   urologist-  dr Sherryl Barters--  dx High Grade TCC without stromal invasion s/p TURBT and intravesical BCG tx/  recurrent 07/2015 (pTa) TCC  repear intravesical BCG tx    History of hiatal hernia    History of urethral stricture    Nocturia     Surgical History: Past Surgical History:  Procedure Laterality Date   CARDIAC CATHETERIZATION  1997   normal coronary arteries   CARDIOVASCULAR STRESS TEST  12-14-2005   Low risk perfusion study/  very small scar in the inferior septum from mid ventricle to apex,  no ischemia/  mild inferior septal hypokinesis/  ef 58%   CATARACT EXTRACTION W/ INTRAOCULAR LENS  IMPLANT, BILATERAL  2011   COLONOSCOPY  last one 06/ 2018   CYSTOSCOPY WITH BIOPSY N/A 08/12/2015   Procedure: CYSTOSCOPY WITH BIOPSY AND FULGERATION;  Surgeon: Hildred Laser, MD;  Location: [facility];  Service: Urology;  Laterality: N/A;   CYSTOSCOPY WITH INJECTION N/A 06/29/2017   Procedure: CYSTOSCOPY WITH INJECTION OF INDOCYANINE GREEN DYE;  Surgeon: Sebastian Ache, MD;  Location: [facility];  Service: Urology;  Laterality: N/A;   CYSTOSCOPY WITH URETHRAL DILATATION N/A 03/21/2017   Procedure: CYSTOSCOPY;  Surgeon: Hildred Laser, MD;  Location: [facility];  Service: Urology;  Laterality: N/A;   ESOPHAGOGASTRODUODENOSCOPY  12-05-2014   IR IMAGING GUIDED PORT INSERTION  02/07/2018   IR NEPHROSTOMY PLACEMENT  LEFT  12/25/2021   IR REMOVAL TUN ACCESS W/ PORT W/O FL MOD SED  04/14/2018   TRANSTHORACIC ECHOCARDIOGRAM  01-31-2008   nomral LVF,  ef 55-65%/  mild pulmonic stenosis without regurg.,  peak transpulomonic valve gradient   TRANSURETHRAL RESECTION OF BLADDER TUMOR N/A 12/31/2014   Procedure: TRANSURETHRAL RESECTION OF BLADDER TUMOR (TURBT);  Surgeon: Su Grand, MD;  Location: [facility];  Service: Urology;  Laterality: N/A;   TRANSURETHRAL RESECTION OF BLADDER TUMOR N/A 03/21/2017   Procedure: POSSIBLE TRANSURETHRAL RESECTION OF BLADDER TUMOR (TURBT);  Surgeon: Hildred Laser, MD;  Location: [facility];  Service: Urology;  Laterality: N/A;    Home Medications:  Allergies as of 04/08/2023       Reactions   Ciprofloxacin Hives   Zofran [ondansetron] Rash        Medication List        Accurate as of April 08, 2023  9:26 AM. If you have any questions, ask your nurse or doctor.          acetaminophen 500 MG tablet Commonly known as: TYLENOL Take 1,000 mg by mouth every 6 (six) hours as needed for mild pain, fever, moderate pain or headache.   apixaban 5 MG Tabs tablet Commonly known as: ELIQUIS Take 1 tablet (5 mg total) by mouth 2 (two) times daily.   betamethasone dipropionate 0.05 % cream Apply topically 2 (two) times daily.  budesonide-formoterol 160-4.5 MCG/ACT inhaler Commonly known as: Symbicort Inhale 2 puffs into the lungs 2 (two) times daily.   chlorpheniramine-HYDROcodone 10-8 MG/5ML Commonly known as: TUSSIONEX Take 5 mLs by mouth every 12 (twelve) hours as needed (severe cough).   dextromethorphan-guaiFENesin 30-600 MG 12hr tablet Commonly known as: MUCINEX DM Take 1 tablet by mouth 2 (two) times daily.   hydrOXYzine 25 MG tablet Commonly known as: ATARAX Take 1 tablet (25 mg total) by mouth 3 (three) times daily as needed.   megestrol 400 MG/10ML suspension Commonly known as: MEGACE Take 10 mLs (400 mg total)  by mouth 2 (two) times daily.   methenamine 1 g tablet Commonly known as: HIPREX Take 1 g by mouth 2 (two) times daily.   methylphenidate 10 MG tablet Commonly known as: RITALIN Take 1 tablet (10 mg total) by mouth daily as needed.   metoprolol tartrate 25 MG tablet Commonly known as: LOPRESSOR Take 1 tablet (25 mg total) by mouth 2 (two) times daily.   pantoprazole 40 MG tablet Commonly known as: PROTONIX Take 1 tablet (40 mg total) by mouth 2 (two) times daily.   predniSONE 10 MG tablet Commonly known as: DELTASONE Take 1 tablet (10 mg total) by mouth daily with breakfast.        Allergies:  Allergies  Allergen Reactions   Ciprofloxacin Hives   Zofran [Ondansetron] Rash    Family History: Family History  Problem Relation Age of Onset   Alcoholism Father    Colon cancer Neg Hx    Colon polyps Neg Hx    Diabetes Neg Hx    Kidney disease Neg Hx    Esophageal cancer Neg Hx    Heart disease Neg Hx    Gallbladder disease Neg Hx    Allergic rhinitis Neg Hx    Angioedema Neg Hx    Asthma Neg Hx    Atopy Neg Hx    Eczema Neg Hx    Immunodeficiency Neg Hx    Urticaria Neg Hx     Social History:  reports that he quit smoking about 34 years ago. His smoking use included cigarettes. He started smoking about 68 years ago. He has a 34 pack-year smoking history. He has never used smokeless tobacco. He reports current alcohol use of about 6.0 standard drinks of alcohol per week. He reports that he does not use drugs.  ROS: All other review of systems were reviewed and are negative except what is noted above in HPI  Physical Exam: BP 116/79   Pulse 86   Constitutional:  Alert and oriented, No acute distress. HEENT: [place] AT, moist mucus membranes.  Trachea midline, no masses. Cardiovascular: No clubbing, cyanosis, or edema. Respiratory: Normal respiratory effort, no increased work of breathing. GI: Abdomen is soft, nontender, nondistended, no abdominal masses GU: No CVA  tenderness.  Lymph: No cervical or inguinal lymphadenopathy. Skin: No rashes, bruises or suspicious lesions. Neurologic: Grossly intact, no focal deficits, moving all 4 extremities. Psychiatric: Normal mood and affect.  Laboratory Data: Lab Results  Component Value Date   WBC 9.5 04/06/2023   HGB 13.6 04/06/2023   HCT 42.3 04/06/2023   MCV 112.8 (H) 04/06/2023   PLT 212 04/06/2023    Lab Results  Component Value Date   CREATININE 0.98 04/06/2023    No results found for: "PSA"  No results found for: "TESTOSTERONE"  Lab Results  Component Value Date   HGBA1C 5.1 06/16/2020    Urinalysis    Component Value Date/Time  COLORURINE YELLOW 12/20/2022 1344   APPEARANCEUR HAZY (A) 12/20/2022 1344   APPEARANCEUR Clear 09/17/2022 1023   LABSPEC 1.014 12/20/2022 1344   PHURINE 6.0 12/20/2022 1344   GLUCOSEU NEGATIVE 12/20/2022 1344   HGBUR NEGATIVE 12/20/2022 1344   BILIRUBINUR NEGATIVE 12/20/2022 1344   BILIRUBINUR Negative 09/17/2022 1023   KETONESUR NEGATIVE 12/20/2022 1344   PROTEINUR NEGATIVE 12/20/2022 1344   NITRITE NEGATIVE 12/20/2022 1344   LEUKOCYTESUR NEGATIVE 12/20/2022 1344    Lab Results  Component Value Date   LABMICR See below: 09/17/2022   WBCUA 6-10 (A) 09/17/2022   LABEPIT 0-10 09/17/2022   MUCUS Present 04/23/2022   BACTERIA MANY (A) 12/20/2022    Pertinent Imaging: *** No results found for this or any previous visit.  Results for orders placed during the hospital encounter of 01/25/18  US Venous Img Lower Bilateral  Narrative CLINICAL DATA:  Bilateral pain and edema  EXAM: BILATERAL LOWER EXTREMITY VENOUS DOPPLER ULTRASOUND  TECHNIQUE: Gray-scale sonography with graded compression, as well as color Doppler and duplex ultrasound were performed to evaluate the lower extremity deep venous systems from the level of the common femoral vein and including the common femoral, femoral, profunda femoral, popliteal and calf veins including the  posterior tibial, peroneal and gastrocnemius veins when visible. The superficial great saphenous vein was also interrogated. Spectral Doppler was utilized to evaluate flow at rest and with distal augmentation maneuvers in the common femoral, femoral and popliteal veins.  COMPARISON:  None.  FINDINGS: RIGHT LOWER EXTREMITY  Common Femoral Vein: No evidence of thrombus. Normal compressibility, respiratory phasicity and response to augmentation.  Saphenofemoral Junction: No evidence of thrombus. Normal compressibility and flow on color Doppler imaging.  Profunda Femoral Vein: No evidence of thrombus. Normal compressibility and flow on color Doppler imaging.  Femoral Vein: No evidence of thrombus. Normal compressibility, respiratory phasicity and response to augmentation.  Popliteal Vein: No evidence of thrombus. Normal compressibility, respiratory phasicity and response to augmentation.  Calf Veins: No evidence of thrombus. Normal compressibility and flow on color Doppler imaging.  Superficial Great Saphenous Vein: No evidence of thrombus. Normal compressibility.  Venous Reflux:  None.  Other Findings:  None.  LEFT LOWER EXTREMITY  Common Femoral Vein: No evidence of thrombus. Normal compressibility, respiratory phasicity and response to augmentation.  Saphenofemoral Junction: No evidence of thrombus. Normal compressibility and flow on color Doppler imaging.  Profunda Femoral Vein: No evidence of thrombus. Normal compressibility and flow on color Doppler imaging.  Femoral Vein: No evidence of thrombus. Normal compressibility, respiratory phasicity and response to augmentation.  Popliteal Vein: No evidence of thrombus. Normal compressibility, respiratory phasicity and response to augmentation.  Calf Veins: No evidence of thrombus. Normal compressibility and flow on color Doppler imaging.  Superficial Great Saphenous Vein: No evidence of thrombus.  Normal compressibility.  Venous Reflux:  None.  Other Findings:  None.  IMPRESSION: No evidence of deep venous thrombosis.   Electronically Signed By: Judie Petit.  Shick M.D. On: 01/27/2018 11:02  No results found for this or any previous visit.  No results found for this or any previous visit.  Results for orders placed during the hospital encounter of 01/25/18  US RENAL  Narrative CLINICAL DATA:  UTI, fever  EXAM: RENAL / URINARY TRACT ULTRASOUND COMPLETE  COMPARISON:  CT 11/25/2017  FINDINGS: Right Kidney:  Length: 11.3 cm. Normal echotexture. No hydronephrosis. 2.3 cm cyst in the midpole.  Left Kidney:  Length: 11.3 cm. Echogenicity within normal limits. No mass or hydronephrosis visualized.  Bladder:  Prior cystectomy.  IMPRESSION: No hydronephrosis or acute findings.   Electronically Signed By: Charlett Nose M.D. On: 01/27/2018 10:56  No valid procedures specified. No results found for this or any previous visit.  Results for orders placed during the hospital encounter of 05/05/22  CT Renal Stone Study  Narrative CLINICAL DATA:  Flank pain, kidney stone suspected. Pt with blood in urine started this afternoon. Recent with UTI  EXAM: CT ABDOMEN AND PELVIS WITHOUT CONTRAST  TECHNIQUE: Multidetector CT imaging of the abdomen and pelvis was performed following the standard protocol without IV contrast.  RADIATION DOSE REDUCTION: This exam was performed according to the departmental dose-optimization program which includes automated exposure control, adjustment of the mA and/or kV according to patient size and/or use of iterative reconstruction technique.  COMPARISON:  CT abdomen pelvis 04/11/2022  FINDINGS: Lower chest: Trace to small right and trace left pleural effusions. Small hiatal hernia.  Hepatobiliary: Several fluid density lesions within the right hepatic lobe likely represent simple renal cysts. No pulmonary nodule. No pulmonary  mass. No pleural effusion. No pneumothorax.  Pancreas: No focal lesion. Normal pancreatic contour. No surrounding inflammatory changes. No main pancreatic ductal dilatation.  Spleen: Normal in size without focal abnormality.  Adrenals/Urinary Tract:  No adrenal nodule bilaterally.  Persistent mild left hydronephrosis and severe left hydroureter with associated urothelial thickening. No nephroureterolithiasis. No hydronephrosis on the right.  Status post radical cystoprostatectomy with lower quadrant ileal conduit formation.  Stomach/Bowel: Stomach is within normal limits. No evidence of bowel wall thickening or dilatation. Colonic diverticulosis. The appendix is not definitely identified with no inflammatory changes in the right lower quadrant to suggest acute appendicitis.  Vascular/Lymphatic: No abdominal aorta or iliac aneurysm. At least moderate atherosclerotic plaque of the aorta and its branches. No abdominal, pelvic, or inguinal lymphadenopathy.  Reproductive: Prostatectomy.  Other: No intraperitoneal free fluid. No intraperitoneal free gas. No organized fluid collection.  Musculoskeletal:  No abdominal wall hernia or abnormality.  Similar-appearing expansile lytic lesion of the right inferior pubic rami. No acute displaced fracture.  IMPRESSION: 1. Persistent mild left hydronephrosis and severe left hydroureter with associated urothelial thickening in a patient status post radical cystoprostatectomy with lower quadrant ileal conduit formation. This may reflect chronic changes of obstructive uropathy or reflux. Limited evaluation on this noncontrast study. Recommend correlation with urinalysis for infection. 2. Trace to small right and trace left pleural effusions. 3. Colonic diverticulosis with no acute diverticulitis. 4. Similar-appearing expansile lytic lesion of the right inferior pubic rami. Finding likely represents an osseous metastasis. 5.  Aortic  Atherosclerosis (ICD10-I70.0).   Electronically Signed By: Tish Frederickson M.D. On: 05/05/2022 23:00   Assessment & Plan:    1. recurrent UTI -continue hiprex 1g BID   No follow-ups on file.  Wilkie Aye, MD  [facility] [place]

## 2023-04-10 ENCOUNTER — Other Ambulatory Visit: Payer: Self-pay

## 2023-04-12 ENCOUNTER — Telehealth: Payer: Self-pay | Admitting: Urology

## 2023-04-12 NOTE — Telephone Encounter (Signed)
FYI-see below- 

## 2023-04-12 NOTE — Telephone Encounter (Signed)
Patient wife called she is going to cancel the [place] for tomorrow , she said Dr Ronne Binning told them that the PET scan was good and the [place] was no longer needed.

## 2023-04-13 ENCOUNTER — Ambulatory Visit (HOSPITAL_COMMUNITY)
Admission: RE | Admit: 2023-04-13 | Discharge: 2023-04-13 | Disposition: A | Discharge status: Home / Self Care | Payer: Medicare Other | Source: Ambulatory Visit | Attending: Interventional Radiology | Admitting: Interventional Radiology

## 2023-04-13 ENCOUNTER — Ambulatory Visit (HOSPITAL_COMMUNITY)
Admission: RE | Admit: 2023-04-13 | Discharge: 2023-04-13 | Disposition: A | Discharge status: Home / Self Care | Payer: Medicare Other | Source: Ambulatory Visit | Attending: Hematology | Admitting: Hematology

## 2023-04-13 ENCOUNTER — Encounter: Payer: Self-pay | Admitting: Urology

## 2023-04-13 ENCOUNTER — Encounter (HOSPITAL_COMMUNITY): Payer: Self-pay

## 2023-04-13 DIAGNOSIS — J90 Pleural effusion, not elsewhere classified: Secondary | ICD-10-CM

## 2023-04-13 DIAGNOSIS — Z8551 Personal history of malignant neoplasm of bladder: Secondary | ICD-10-CM | POA: Insufficient documentation

## 2023-04-13 DIAGNOSIS — C679 Malignant neoplasm of bladder, unspecified: Secondary | ICD-10-CM

## 2023-04-13 MED ORDER — LIDOCAINE HCL (PF) 2 % IJ SOLN
INTRAMUSCULAR | Status: AC
  Filled 2023-04-13: qty 10

## 2023-04-13 MED ORDER — LIDOCAINE HCL (PF) 2 % IJ SOLN
10.0000 mL | Freq: Once | INTRAMUSCULAR | Status: AC
  Administered 2023-04-13: 10 mL

## 2023-04-13 NOTE — Progress Notes (Signed)
Patient tolerated right sided thoracentesis procedure well today and 2.2 Liters of clear yellow pleural fluid removed. Patient verbalized understanding of discharge instructions and ambulatory to chest xray department at this time for post chest xray with no acute distress noted.

## 2023-04-13 NOTE — Patient Instructions (Signed)
Urinary Tract Infection, Adult  A urinary tract infection (UTI) is an infection of any part of the urinary tract. The urinary tract includes the kidneys, ureters, bladder, and urethra. These organs make, store, and get rid of urine in the body. An upper UTI affects the ureters and kidneys. A lower UTI affects the bladder and urethra. What are the causes? Most urinary tract infections are caused by bacteria in your genital area around your urethra, where urine leaves your body. These bacteria grow and cause inflammation of your urinary tract. What increases the risk? You are more likely to develop this condition if: You have a urinary catheter that stays in place. You are not able to control when you urinate or have a bowel movement (incontinence). You are male and you: Use a spermicide or diaphragm for birth control. Have low estrogen levels. Are pregnant. You have certain genes that increase your risk. You are sexually active. You take antibiotic medicines. You have a condition that causes your flow of urine to slow down, such as: An enlarged prostate, if you are male. Blockage in your urethra. A kidney stone. A nerve condition that affects your bladder control (neurogenic bladder). Not getting enough to drink, or not urinating often. You have certain medical conditions, such as: Diabetes. A weak disease-fighting system (immunesystem). Sickle cell disease. Gout. Spinal cord injury. What are the signs or symptoms? Symptoms of this condition include: Needing to urinate right away (urgency). Frequent urination. This may include small amounts of urine each time you urinate. Pain or burning with urination. Blood in the urine. Urine that smells bad or unusual. Trouble urinating. Cloudy urine. Vaginal discharge, if you are male. Pain in the abdomen or the lower back. You may also have: Vomiting or a decreased appetite. Confusion. Irritability or tiredness. A fever or  chills. Diarrhea. The first symptom in older adults may be confusion. In some cases, they may not have any symptoms until the infection has worsened. How is this diagnosed? This condition is diagnosed based on your medical history and a physical exam. You may also have other tests, including: Urine tests. Blood tests. Tests for STIs (sexually transmitted infections). If you have had more than one UTI, a cystoscopy or imaging studies may be done to determine the cause of the infections. How is this treated? Treatment for this condition includes: Antibiotic medicine. Over-the-counter medicines to treat discomfort. Drinking enough water to stay hydrated. If you have frequent infections or have other conditions such as a kidney stone, you may need to see a health care provider who specializes in the urinary tract (urologist). In rare cases, urinary tract infections can cause sepsis. Sepsis is a life-threatening condition that occurs when the body responds to an infection. Sepsis is treated in the hospital with IV antibiotics, fluids, and other medicines. Follow these instructions at home:  Medicines Take over-the-counter and prescription medicines only as told by your health care provider. If you were prescribed an antibiotic medicine, take it as told by your health care provider. Do not stop using the antibiotic even if you start to feel better. General instructions Make sure you: Empty your bladder often and completely. Do not hold urine for long periods of time. Empty your bladder after sex. Wipe from front to back after urinating or having a bowel movement if you are male. Use each tissue only one time when you wipe. Drink enough fluid to keep your urine pale yellow. Keep all follow-up visits. This is important. Contact a health   care provider if: Your symptoms do not get better after 1-2 days. Your symptoms go away and then return. Get help right away if: You have severe pain in  your back or your lower abdomen. You have a fever or chills. You have nausea or vomiting. Summary A urinary tract infection (UTI) is an infection of any part of the urinary tract, which includes the kidneys, ureters, bladder, and urethra. Most urinary tract infections are caused by bacteria in your genital area. Treatment for this condition often includes antibiotic medicines. If you were prescribed an antibiotic medicine, take it as told by your health care provider. Do not stop using the antibiotic even if you start to feel better. Keep all follow-up visits. This is important. This information is not intended to replace advice given to you by your health care provider. Make sure you discuss any questions you have with your health care provider. Document Revised: 03/09/2020 Document Reviewed: 03/14/2020 Elsevier Patient Education  2024 Elsevier Inc.  

## 2023-04-27 ENCOUNTER — Inpatient Hospital Stay: Payer: Medicare Other | Attending: Oncology

## 2023-04-27 ENCOUNTER — Inpatient Hospital Stay: Payer: Medicare Other | Admitting: Hematology

## 2023-04-27 ENCOUNTER — Inpatient Hospital Stay: Payer: Medicare Other

## 2023-04-27 ENCOUNTER — Other Ambulatory Visit: Payer: Self-pay | Admitting: *Deleted

## 2023-04-27 VITALS — BP 103/72 | HR 60 | Temp 97.5°F | Resp 18

## 2023-04-27 DIAGNOSIS — C7801 Secondary malignant neoplasm of right lung: Secondary | ICD-10-CM | POA: Diagnosis not present

## 2023-04-27 DIAGNOSIS — Z5112 Encounter for antineoplastic immunotherapy: Secondary | ICD-10-CM | POA: Insufficient documentation

## 2023-04-27 DIAGNOSIS — C679 Malignant neoplasm of bladder, unspecified: Secondary | ICD-10-CM | POA: Insufficient documentation

## 2023-04-27 DIAGNOSIS — C7802 Secondary malignant neoplasm of left lung: Secondary | ICD-10-CM | POA: Diagnosis not present

## 2023-04-27 DIAGNOSIS — C7951 Secondary malignant neoplasm of bone: Secondary | ICD-10-CM | POA: Diagnosis not present

## 2023-04-27 DIAGNOSIS — Z79620 Long term (current) use of immunosuppressive biologic: Secondary | ICD-10-CM | POA: Insufficient documentation

## 2023-04-27 DIAGNOSIS — J90 Pleural effusion, not elsewhere classified: Secondary | ICD-10-CM

## 2023-04-27 LAB — COMPREHENSIVE METABOLIC PANEL
ALT: 17 U/L (ref 0–44)
AST: 13 U/L — ABNORMAL LOW (ref 15–41)
Albumin: 2.6 g/dL — ABNORMAL LOW (ref 3.5–5.0)
Alkaline Phosphatase: 51 U/L (ref 38–126)
Anion gap: 6 (ref 5–15)
BUN: 13 mg/dL (ref 8–23)
CO2: 28 mmol/L (ref 22–32)
Calcium: 8.4 mg/dL — ABNORMAL LOW (ref 8.9–10.3)
Chloride: 103 mmol/L (ref 98–111)
Creatinine, Ser: 1.01 mg/dL (ref 0.61–1.24)
GFR, Estimated: >60 mL/min (ref >60)
Glucose, Bld: 74 mg/dL (ref 70–99)
Potassium: 4 mmol/L (ref 3.5–5.1)
Sodium: 137 mmol/L (ref 135–145)
Total Bilirubin: 0.4 mg/dL (ref 0.3–1.2)
Total Protein: 5.4 g/dL — ABNORMAL LOW (ref 6.5–8.1)

## 2023-04-27 LAB — CBC WITH DIFFERENTIAL/PLATELET
Abs Immature Granulocytes: 0.1 10*3/uL — ABNORMAL HIGH (ref 0.00–0.07)
Basophils Absolute: 0.1 10*3/uL (ref 0.0–0.1)
Basophils Relative: 1 %
Eosinophils Absolute: 0.2 10*3/uL (ref 0.0–0.5)
Eosinophils Relative: 2 %
HCT: 42 % (ref 39.0–52.0)
Hemoglobin: 13.7 g/dL (ref 13.0–17.0)
Immature Granulocytes: 1 %
Lymphocytes Relative: 8 %
Lymphs Abs: 0.9 10*3/uL (ref 0.7–4.0)
MCH: 36.3 pg — ABNORMAL HIGH (ref 26.0–34.0)
MCHC: 32.6 g/dL (ref 30.0–36.0)
MCV: 111.4 fL — ABNORMAL HIGH (ref 80.0–100.0)
Monocytes Absolute: 1 10*3/uL (ref 0.1–1.0)
Monocytes Relative: 9 %
Neutro Abs: 9.2 10*3/uL — ABNORMAL HIGH (ref 1.7–7.7)
Neutrophils Relative %: 79 %
Platelets: 243 10*3/uL (ref 150–400)
RBC: 3.77 MIL/uL — ABNORMAL LOW (ref 4.22–5.81)
RDW: 12.9 % (ref 11.5–15.5)
WBC: 11.5 10*3/uL — ABNORMAL HIGH (ref 4.0–10.5)
nRBC: 0 % (ref 0.0–0.2)

## 2023-04-27 LAB — TSH: TSH: 5.904 u[IU]/mL — ABNORMAL HIGH (ref 0.350–4.500)

## 2023-04-27 LAB — MAGNESIUM: Magnesium: 2.3 mg/dL (ref 1.7–2.4)

## 2023-04-27 MED ORDER — SODIUM CHLORIDE 0.9 % IV SOLN
200.0000 mg | Freq: Once | INTRAVENOUS | Status: AC
  Administered 2023-04-27: 200 mg via INTRAVENOUS
  Filled 2023-04-27: qty 8

## 2023-04-27 MED ORDER — SODIUM CHLORIDE 0.9 % IV SOLN: Freq: Once | INTRAVENOUS | Status: AC

## 2023-04-27 NOTE — Progress Notes (Signed)
Patient presents today for Keytruda infusion per providers order.  Vital signs and labs within parameters for treatment.    Peripheral IV started and blood return noted pre and post infusion.  Treatment given today per MD orders.  Stable during infusion without adverse affects.  Vital signs stable.  No complaints at this time.  Discharge from clinic ambulatory in stable condition.  Alert and oriented X 3.  Follow up with [facility] as scheduled.

## 2023-04-27 NOTE — Patient Instructions (Signed)
[facility]  Discharge Instructions: Thank you for choosing [facility] to provide your oncology and hematology care.  If you have a lab appointment with the Cancer Center - please note that after April 8th, 2024, all labs will be drawn in the cancer center.  You do not have to check in or register with the main entrance as you have in the past but will complete your check-in in the cancer center.  Wear comfortable clothing and clothing appropriate for easy access to any Portacath or PICC line.   We strive to give you quality time with your provider. You may need to reschedule your appointment if you arrive late (15 or more minutes).  Arriving late affects you and other patients whose appointments are after yours.  Also, if you miss three or more appointments without notifying the office, you may be dismissed from the clinic at the provider's discretion.      For prescription refill requests, have your pharmacy contact our office and allow 72 hours for refills to be completed.    Today you received the following chemotherapy and/or immunotherapy agents Keytruda      To help prevent nausea and vomiting after your treatment, we encourage you to take your nausea medication as directed.  BELOW ARE SYMPTOMS THAT SHOULD BE REPORTED IMMEDIATELY: *FEVER GREATER THAN 100.4 F (38 C) OR HIGHER *CHILLS OR SWEATING *NAUSEA AND VOMITING THAT IS NOT CONTROLLED WITH YOUR NAUSEA MEDICATION *UNUSUAL SHORTNESS OF BREATH *UNUSUAL BRUISING OR BLEEDING *URINARY PROBLEMS (pain or burning when urinating, or frequent urination) *BOWEL PROBLEMS (unusual diarrhea, constipation, pain near the anus) TENDERNESS IN MOUTH AND THROAT WITH OR WITHOUT PRESENCE OF ULCERS (sore throat, sores in mouth, or a toothache) UNUSUAL RASH, SWELLING OR PAIN  UNUSUAL VAGINAL DISCHARGE OR ITCHING   Items with * indicate a potential emergency and should be followed up as soon as possible or go to the  Emergency Department if any problems should occur.  Please show the CHEMOTHERAPY ALERT CARD or IMMUNOTHERAPY ALERT CARD at check-in to the Emergency Department and triage nurse.  Should you have questions after your visit or need to cancel or reschedule your appointment, please contact [facility] [phone]  and follow the prompts.  Office hours are 8:00 a.m. to 4:30 p.m. Monday - Friday. Please note that voicemails left after 4:00 p.m. may not be returned until the following business day.  We are closed weekends and major holidays. You have access to a nurse at all times for urgent questions. Please call the main number to the clinic [phone] and follow the prompts.  For any non-urgent questions, you may also contact your provider using MyChart. We now offer e-Visits for anyone 18 and older to request care online for non-urgent symptoms. For details visit [email or web address].   Also download the MyChart app! Go to the app store, search "MyChart", open the app, select [place], and log in with your MyChart username and password.   

## 2023-04-28 LAB — T4: T4, Total: 5.6 ug/dL (ref 4.5–12.0)

## 2023-05-09 ENCOUNTER — Encounter: Payer: Medicare Other | Admitting: Dietician

## 2023-05-09 ENCOUNTER — Telehealth: Payer: Self-pay | Admitting: Dietician

## 2023-05-09 NOTE — Telephone Encounter (Signed)
Nutrition Follow-up:  Patient with metastatic prostate cancer. He is receiving [place] q21d.   Spoke with wife of pt via telephone. Pt is not at home at time of call. Wife provides history. Wife reports pt appetite is not as good as it used to be, however this has improved. He is eating 2-3 meals daily. He likes biscuit/gravy for breakfast. Patient had a hot dog for lunch. She is asking how often he can have these. Wife reports pt enjoys snacking on hoop cheese. Pt has not been drinking Boost recently. Wife reports they have a refrigerator full of them. Denies nausea, vomiting, diarrhea, constipation.   Medications: reviewed   Labs: 9/11 - albumin 2.6  Anthropometrics: Last wt 174 lb 4 oz on 8/21 - trending up  7/31 - 173 lb 7/9 - 170 lb 4.8 oz 6/18 - 168 lb 4.8 oz    NUTRITION DIAGNOSIS: Unintended wt loss - stable   INTERVENTION:  Educated on foods with protein, encouraged protein source at every meal Discussed soft moist foods for ease of intake given SOB - thoracentesis pending  Recommend daily Boost for added calories and protien   MONITORING, EVALUATION, GOAL: wt trends, intake   NEXT VISIT: To be scheduled as needed. Wife has contact information. Encouraged to call with nutrition questions/concerns

## 2023-05-18 ENCOUNTER — Inpatient Hospital Stay: Payer: Medicare Other

## 2023-05-18 ENCOUNTER — Inpatient Hospital Stay: Payer: Medicare Other | Attending: Oncology

## 2023-05-18 ENCOUNTER — Encounter: Payer: Self-pay | Admitting: Hematology

## 2023-05-18 ENCOUNTER — Inpatient Hospital Stay (HOSPITAL_BASED_OUTPATIENT_CLINIC_OR_DEPARTMENT_OTHER): Payer: Medicare Other | Admitting: Hematology

## 2023-05-18 VITALS — BP 105/56 | HR 69 | Temp 97.4°F | Resp 18

## 2023-05-18 VITALS — BP 122/73 | HR 71 | Temp 97.0°F | Resp 20 | Wt 176.0 lb

## 2023-05-18 DIAGNOSIS — C7801 Secondary malignant neoplasm of right lung: Secondary | ICD-10-CM | POA: Insufficient documentation

## 2023-05-18 DIAGNOSIS — C7802 Secondary malignant neoplasm of left lung: Secondary | ICD-10-CM | POA: Diagnosis not present

## 2023-05-18 DIAGNOSIS — Z23 Encounter for immunization: Secondary | ICD-10-CM | POA: Diagnosis not present

## 2023-05-18 DIAGNOSIS — C7951 Secondary malignant neoplasm of bone: Secondary | ICD-10-CM | POA: Diagnosis not present

## 2023-05-18 DIAGNOSIS — C679 Malignant neoplasm of bladder, unspecified: Secondary | ICD-10-CM | POA: Diagnosis not present

## 2023-05-18 DIAGNOSIS — Z79620 Long term (current) use of immunosuppressive biologic: Secondary | ICD-10-CM | POA: Diagnosis not present

## 2023-05-18 DIAGNOSIS — Z5112 Encounter for antineoplastic immunotherapy: Secondary | ICD-10-CM | POA: Insufficient documentation

## 2023-05-18 LAB — CBC WITH DIFFERENTIAL/PLATELET
Abs Immature Granulocytes: 0.05 10*3/uL (ref 0.00–0.07)
Basophils Absolute: 0.1 10*3/uL (ref 0.0–0.1)
Basophils Relative: 2 %
Eosinophils Absolute: 0.2 10*3/uL (ref 0.0–0.5)
Eosinophils Relative: 2 %
HCT: 43.5 % (ref 39.0–52.0)
Hemoglobin: 14.2 g/dL (ref 13.0–17.0)
Immature Granulocytes: 1 %
Lymphocytes Relative: 11 %
Lymphs Abs: 0.8 10*3/uL (ref 0.7–4.0)
MCH: 35.9 pg — ABNORMAL HIGH (ref 26.0–34.0)
MCHC: 32.6 g/dL (ref 30.0–36.0)
MCV: 110.1 fL — ABNORMAL HIGH (ref 80.0–100.0)
Monocytes Absolute: 0.7 10*3/uL (ref 0.1–1.0)
Monocytes Relative: 9 %
Neutro Abs: 5.9 10*3/uL (ref 1.7–7.7)
Neutrophils Relative %: 75 %
Platelets: 237 10*3/uL (ref 150–400)
RBC: 3.95 MIL/uL — ABNORMAL LOW (ref 4.22–5.81)
RDW: 12.6 % (ref 11.5–15.5)
WBC: 7.8 10*3/uL (ref 4.0–10.5)
nRBC: 0 % (ref 0.0–0.2)

## 2023-05-18 LAB — COMPREHENSIVE METABOLIC PANEL
ALT: 15 U/L (ref 0–44)
AST: 12 U/L — ABNORMAL LOW (ref 15–41)
Albumin: 2.8 g/dL — ABNORMAL LOW (ref 3.5–5.0)
Alkaline Phosphatase: 52 U/L (ref 38–126)
Anion gap: 5 (ref 5–15)
BUN: 14 mg/dL (ref 8–23)
CO2: 29 mmol/L (ref 22–32)
Calcium: 8.5 mg/dL — ABNORMAL LOW (ref 8.9–10.3)
Chloride: 108 mmol/L (ref 98–111)
Creatinine, Ser: 1.25 mg/dL — ABNORMAL HIGH (ref 0.61–1.24)
GFR, Estimated: 57 mL/min — ABNORMAL LOW (ref >60)
Glucose, Bld: 96 mg/dL (ref 70–99)
Potassium: 4.1 mmol/L (ref 3.5–5.1)
Sodium: 142 mmol/L (ref 135–145)
Total Bilirubin: 0.6 mg/dL (ref 0.3–1.2)
Total Protein: 5.6 g/dL — ABNORMAL LOW (ref 6.5–8.1)

## 2023-05-18 LAB — MAGNESIUM: Magnesium: 2.3 mg/dL (ref 1.7–2.4)

## 2023-05-18 MED ORDER — SODIUM CHLORIDE 0.9 % IV SOLN
200.0000 mg | Freq: Once | INTRAVENOUS | Status: AC
  Administered 2023-05-18: 200 mg via INTRAVENOUS
  Filled 2023-05-18: qty 8

## 2023-05-18 MED ORDER — SODIUM CHLORIDE 0.9 % IV SOLN: Freq: Once | INTRAVENOUS | Status: AC

## 2023-05-18 MED ORDER — HEPARIN SOD (PORK) LOCK FLUSH 100 UNIT/ML IV SOLN: 500.0000 [IU] | Freq: Once | INTRAVENOUS | Status: DC | PRN

## 2023-05-18 MED ORDER — SODIUM CHLORIDE 0.9% FLUSH: 10.0000 mL | INTRAVENOUS | Status: DC | PRN

## 2023-05-18 NOTE — Progress Notes (Signed)
[facility] [address], [place] [address]    Clinic Day:  05/18/2023  Referring physician: Elfredia Nevins, MD  Patient Care Team: Elfredia Nevins, MD as PCP - General (Internal Medicine) Marinus Maw, MD as PCP - Electrophysiology (Cardiology) Doreatha Massed, MD as Medical Oncologist (Medical Oncology) Therese Sarah, RN as Oncology Nurse Navigator (Medical Oncology)   ASSESSMENT & PLAN:   Assessment: 1.  Metastatic urothelial carcinoma of the bladder to the lungs and bones: - Neoadjuvant chemotherapy with gemcitabine and cisplatin cycle 1 on 05/02/2017 - Cystoprostatectomy, bilateral lymphadenectomy by Dr. Berneice Heinrich on 06/29/2017, pathology T3b N0, 0/13 lymph nodes - Developed metastatic disease in April 2019, biopsy of the right ischium on 12/08/2017 with poorly differentiated carcinoma consistent with metastatic urothelial carcinoma. - XRT to the pelvis completed on 01/16/2018 - Carboplatin and gemcitabine 6 cycles from 01/30/2018 through 05/22/2018 - Pembrolizumab every 3 weeks started on 06/21/2018, discontinued in April 2020 due to skin toxicity - XRT to the right paratracheal lymph node, 50 Gray in 10 fractions completed on 04/23/2020 - XRT to the intrathoracic lymph node completed on 11/27/2021  - 12/20/2022: Right thoracentesis with 1.8 L removed - 12/21/2022: Left thoracentesis with 700 mL fluid removed, cytology negative. - Unclear etiology of recurrent effusions, exudative. - 2D echocardiogram: Normal LVEF with grade 1 diastolic dysfunction. - CT angiogram 12/20/2022: Negative for PE.  Multifocal parenchymal opacities.  No adenopathy. - Keytruda started on 01/07/2023.   2.  Social/family history: - Lives at home with his wife. - Worked for [employer].  Quit smoking 40 years ago. - No family history of malignancies.    Plan: 1.  Metastatic urothelial carcinoma of the bladder to the lungs and bones: - PET scan (03/31/2023): Lung  lesions have completely resolved.  No evidence of metastatic disease. - He had a right thoracentesis on 04/13/2023 with 2.2 L removed.  He felt better with improvement in cough for 1 day. - Physical exam: Decreased breath sounds in the right lung base, lower one third. - Labs: Normal LFTs.  Albumin 2.8.  CBC grossly normal.  TSH is 5.9.  He has mild swelling on the dorsum of the feet.  Advised to wear compression socks. - Continue treatment today and in 3 weeks.  RTC 6 weeks for follow-up.  Will plan on repeating PET scan prior to next visit.  He will follow-up with pulmonology for continuing cough.   2.  Dry cough: - He reports having cough with the clear mucus mostly in the mornings but occasionally during daytime. - Continue Robitussin 1-2 times daily as needed.   3.  Loss of appetite/weight loss: - He is continuing to eat better.  His weight has improved by 2 pounds since last visit.   4.  Immunotherapy induced skin rash: - Continue prednisone 10 mg daily.  Rash and itching has improved.   5.  Cancer-related fatigue: - Continue Ritalin 10 mg in the mornings as needed which is helping.    Orders Placed This Encounter  Procedures   NM PET Image Restag (PS) Skull Base To Thigh    Standing Status:   Future    Standing Expiration Date:   05/17/2024    Order Specific Question:   If indicated for the ordered procedure, I authorize the administration of a radiopharmaceutical per Radiology protocol    Answer:   Yes    Order Specific Question:   Preferred imaging location?    Answer:   [facility]   Magnesium    Standing Status:   Future    Standing Expiration Date:   07/19/2024   CBC with Differential    Standing Status:   Future    Standing Expiration Date:   07/19/2024   Comprehensive metabolic panel    Standing Status:   Future    Standing Expiration Date:   07/19/2024   Magnesium    Standing Status:   Future    Standing Expiration Date:   08/09/2024   CBC with Differential     Standing Status:   Future    Standing Expiration Date:   08/09/2024   Comprehensive metabolic panel    Standing Status:   Future    Standing Expiration Date:   08/09/2024   Magnesium    Standing Status:   Future    Standing Expiration Date:   08/30/2024   CBC with Differential    Standing Status:   Future    Standing Expiration Date:   08/30/2024   Comprehensive metabolic panel    Standing Status:   Future    Standing Expiration Date:   08/30/2024   T4    Standing Status:   Future    Standing Expiration Date:   08/30/2024   TSH    Standing Status:   Future    Standing Expiration Date:   08/30/2024   Magnesium    Standing Status:   Future    Standing Expiration Date:   09/20/2024   CBC with Differential    Standing Status:   Future    Standing Expiration Date:   09/20/2024   Comprehensive metabolic panel    Standing Status:   Future    Standing Expiration Date:   09/20/2024      I,Katie Daubenspeck,acting as a scribe for Doreatha Massed, MD.,have documented all relevant documentation on the behalf of Doreatha Massed, MD,as directed by  Doreatha Massed, MD while in the presence of Doreatha Massed, MD.   I, Doreatha Massed MD, have reviewed the above documentation for accuracy and completeness, and I agree with the above.   Doreatha Massed, MD   10/2/20241:32 PM  CHIEF COMPLAINT:   Diagnosis: Metastatic urothelial carcinoma of the bladder    Cancer Staging  No matching staging information was found for the patient.    Prior Therapy: 1. Neoadjuvant gemcitabine/cisplatin on 05/02/17 (one cycle) 2. Cystoprostatectomy and bilateral lymphadenectomy on 06/29/2017 (Dr. Berneice Heinrich) 3. Radiation therapy to the pelvic lesion 01/02/18 - 01/16/18 4. Carboplatin and gemcitabine, 6 cycles 02/06/18 - 05/22/18 5. Pembrolizumab 06/21/18 - 12/13/18 6. Radiation therapy to RUL and right paratracheal nodes 04/09/20 - 04/23/20 7. Radiation therapy to intrathoracic lymph nodes 11/16/21 -  11/27/21  Current Therapy:  pembrolizumab    HISTORY OF PRESENT ILLNESS:   Oncology History  Stage IV malignant neoplasm of urinary bladder /with mets to the bones and lungs  04/20/2017 Initial Diagnosis   Malignant neoplasm of urinary bladder (HCC)   01/30/2018 - 05/22/2018 Chemotherapy   The patient had palonosetron (ALOXI) injection 0.25 mg, 0.25 mg, Intravenous,  Once, 6 of 6 cycles Administration: 0.25 mg (01/30/2018), 0.25 mg (02/20/2018), 0.25 mg (03/13/2018), 0.25 mg (04/03/2018), 0.25 mg (05/01/2018), 0.25 mg (05/22/2018) CARBOplatin (PARAPLATIN) 400 mg in sodium chloride 0.9 % 250 mL chemo infusion, 400 mg (100 % of original dose 400 mg), Intravenous,  Once, 6 of 6 cycles Dose modification: 400 mg (original dose 400 mg, Cycle 1),   (original dose 400 mg, Cycle 2, Reason: Patient Age), 75  mg (original dose 400 mg, Cycle 6) Administration: 400 mg (01/30/2018), 400 mg (02/20/2018), 400 mg (03/13/2018), 410 mg (04/03/2018), 410 mg (05/01/2018), 410 mg (05/22/2018) gemcitabine (GEMZAR) 2,128 mg in sodium chloride 0.9 % 250 mL chemo infusion, 1,000 mg/m2 = 2,128 mg, Intravenous,  Once, 6 of 6 cycles Dose modification: 800 mg/m2 (original dose 1,000 mg/m2, Cycle 4, Reason: Provider Judgment), 800 mg/m2 (original dose 1,000 mg/m2, Cycle 4, Reason: Provider Judgment) Administration: 2,128 mg (01/30/2018), 2,128 mg (02/06/2018), 2,128 mg (02/20/2018), 2,128 mg (02/27/2018), 2,128 mg (03/13/2018), 1,710 mg (04/03/2018), 1,710 mg (04/10/2018), 1,710 mg (05/01/2018), 1,710 mg (05/22/2018) fosaprepitant (EMEND) 150 mg, dexamethasone (DECADRON) 12 mg in sodium chloride 0.9 % 145 mL IVPB, , Intravenous,  Once, 6 of 6 cycles Administration:  (01/30/2018),  (02/20/2018),  (03/13/2018),  (04/03/2018),  (05/01/2018),  (05/22/2018)  for chemotherapy treatment.    01/07/2023 -  Chemotherapy   Patient is on Treatment Plan : BLADDER Pembrolizumab (200) q21d     Bladder cancer (HCC)  06/28/2017 Initial Diagnosis   Bladder cancer (HCC)    06/21/2018 - 12/13/2018 Chemotherapy   The patient had pembrolizumab (KEYTRUDA) 200 mg in sodium chloride 0.9 % 50 mL chemo infusion, 200 mg, Intravenous, Once, 8 of 9 cycles Administration: 200 mg (06/21/2018), 200 mg (07/12/2018), 200 mg (08/02/2018), 200 mg (08/23/2018), 200 mg (09/13/2018), 200 mg (10/04/2018), 200 mg (11/22/2018), 200 mg (12/13/2018)  for chemotherapy treatment.       INTERVAL HISTORY:   [name] is a 82 y.o. male presenting to clinic today for follow up of Metastatic urothelial carcinoma of the bladder. He was last seen by me on 04/06/23.  Since his last visit, he underwent repeat therapeutic right thoracentesis on 04/13/23.  Today, he states that he is doing well overall. His appetite level is at 75%. His energy level is at 10%.  PAST MEDICAL HISTORY:  0 Past Medical History: Past Medical History:  Diagnosis Date   Bladder cancer (HCC)    Bone cancer (HCC)    BPH (benign prostatic hypertrophy)    Dysuria    GERD (gastroesophageal reflux disease)    History of adenomatous polyp of colon    tubular adenoma 06/ 2018   History of bladder cancer 12/2014   urologist-  dr Sherryl Barters--  dx High Grade TCC without stromal invasion s/p TURBT and intravesical BCG tx/  recurrent 07/2015 (pTa) TCC  repear intravesical BCG tx    History of hiatal hernia    History of urethral stricture    Nocturia     Surgical History: Past Surgical History:  Procedure Laterality Date   CARDIAC CATHETERIZATION  1997   normal coronary arteries   CARDIOVASCULAR STRESS TEST  12-14-2005   Low risk perfusion study/  very small scar in the inferior septum from mid ventricle to apex,  no ischemia/  mild inferior septal hypokinesis/  ef 58%   CATARACT EXTRACTION W/ INTRAOCULAR LENS  IMPLANT, BILATERAL  2011   COLONOSCOPY  last one 06/ 2018   CYSTOSCOPY WITH BIOPSY N/A 08/12/2015   Procedure: CYSTOSCOPY WITH BIOPSY AND FULGERATION;  Surgeon: Hildred Laser, MD;  Location: [facility];   Service: Urology;  Laterality: N/A;   CYSTOSCOPY WITH INJECTION N/A 06/29/2017   Procedure: CYSTOSCOPY WITH INJECTION OF INDOCYANINE GREEN DYE;  Surgeon: Sebastian Ache, MD;  Location: [facility];  Service: Urology;  Laterality: N/A;   CYSTOSCOPY WITH URETHRAL DILATATION N/A 03/21/2017   Procedure: CYSTOSCOPY;  Surgeon: Hildred Laser, MD;  Location: [facility];  Service: Urology;  Laterality: N/A;   ESOPHAGOGASTRODUODENOSCOPY  12-05-2014   IR IMAGING GUIDED PORT INSERTION  02/07/2018   IR NEPHROSTOMY PLACEMENT LEFT  12/25/2021   IR REMOVAL TUN ACCESS W/ PORT W/O FL MOD SED  04/14/2018   TRANSTHORACIC ECHOCARDIOGRAM  01-31-2008   nomral LVF,  ef 55-65%/  mild pulmonic stenosis without regurg.,  peak transpulomonic valve gradient   TRANSURETHRAL RESECTION OF BLADDER TUMOR N/A 12/31/2014   Procedure: TRANSURETHRAL RESECTION OF BLADDER TUMOR (TURBT);  Surgeon: Su Grand, MD;  Location: [facility];  Service: Urology;  Laterality: N/A;   TRANSURETHRAL RESECTION OF BLADDER TUMOR N/A 03/21/2017   Procedure: POSSIBLE TRANSURETHRAL RESECTION OF BLADDER TUMOR (TURBT);  Surgeon: Hildred Laser, MD;  Location: [facility];  Service: Urology;  Laterality: N/A;    Social History: Social History   Socioeconomic History   Marital status: Married    Spouse name: Not on file   Number of children: 1   Years of education: Not on file   Highest education level: Not on file  Occupational History   Occupation: Retired  Tobacco Use   Smoking status: Former    Current packs/day: 0.00    Average packs/day: 1 pack/day for 34.0 years (34.0 ttl pk-yrs)    Types: Cigarettes    Start date: 12/27/1954    Quit date: 12/26/1988    Years since quitting: 34.4   Smokeless tobacco: Never  Vaping Use   Vaping status: Never Used  Substance and Sexual Activity   Alcohol use: Yes    Alcohol/week: 6.0 standard drinks of alcohol    Types: 6 Cans of beer per week     Comment: BEER   Drug use: No   Sexual activity: Not Currently  Other Topics Concern   Not on file  Social History Narrative   Not on file   Social Determinants of Health   Financial Resource Strain: Not on file  Food Insecurity: No Food Insecurity (12/20/2022)   Hunger Vital Sign    Worried About Running Out of Food in the Last Year: Never true    Ran Out of Food in the Last Year: Never true  Transportation Needs: No Transportation Needs (12/20/2022)   PRAPARE - Administrator, Civil Service (Medical): No    Lack of Transportation (Non-Medical): No  Physical Activity: Not on file  Stress: Not on file  Social Connections: Unknown (12/28/2021)   [facility] from [facility]   Social Network    Social Network: Not on file  Intimate Partner Violence: Not At Risk (12/20/2022)   Humiliation, Afraid, Rape, and Kick questionnaire    Fear of Current or Ex-Partner: No    Emotionally Abused: No    Physically Abused: No    Sexually Abused: No    Family History: Family History  Problem Relation Age of Onset   Alcoholism Father    Colon cancer Neg Hx    Colon polyps Neg Hx    Diabetes Neg Hx    Kidney disease Neg Hx    Esophageal cancer Neg Hx    Heart disease Neg Hx    Gallbladder disease Neg Hx    Allergic rhinitis Neg Hx    Angioedema Neg Hx    Asthma Neg Hx    Atopy Neg Hx    Eczema Neg Hx    Immunodeficiency Neg Hx    Urticaria Neg Hx     Current Medications:  Current Outpatient Medications:  acetaminophen (TYLENOL) 500 MG tablet, Take 1,000 mg by mouth every 6 (six) hours as needed for mild pain, fever, moderate pain or headache., Disp: , Rfl:    apixaban (ELIQUIS) 5 MG TABS tablet, Take 1 tablet (5 mg total) by mouth 2 (two) times daily., Disp: 60 tablet, Rfl: 2   betamethasone dipropionate 0.05 % cream, Apply topically 2 (two) times daily., Disp: 60 g, Rfl: 2   budesonide-formoterol (SYMBICORT) 160-4.5 MCG/ACT inhaler, Inhale 2 puffs into the  lungs 2 (two) times daily., Disp: 1 each, Rfl: 6   chlorpheniramine-HYDROcodone (TUSSIONEX) 10-8 MG/5ML, Take 5 mLs by mouth every 12 (twelve) hours as needed (severe cough)., Disp: 70 mL, Rfl: 0   dextromethorphan-guaiFENesin (MUCINEX DM) 30-600 MG 12hr tablet, Take 1 tablet by mouth 2 (two) times daily., Disp: , Rfl:    hydrOXYzine (ATARAX) 25 MG tablet, Take 1 tablet (25 mg total) by mouth 3 (three) times daily as needed., Disp: 90 tablet, Rfl: 1   megestrol (MEGACE) 400 MG/10ML suspension, Take 10 mLs (400 mg total) by mouth 2 (two) times daily., Disp: 480 mL, Rfl: 3   methenamine (HIPREX) 1 g tablet, Take 1 tablet (1 g total) by mouth 2 (two) times daily., Disp: 60 tablet, Rfl: 11   methylphenidate (RITALIN) 10 MG tablet, Take 1 tablet (10 mg total) by mouth daily as needed., Disp: 30 tablet, Rfl: 0   metoprolol tartrate (LOPRESSOR) 25 MG tablet, Take 1 tablet (25 mg total) by mouth 2 (two) times daily., Disp: 60 tablet, Rfl: 2   pantoprazole (PROTONIX) 40 MG tablet, Take 1 tablet (40 mg total) by mouth 2 (two) times daily., Disp: 60 tablet, Rfl: 0   predniSONE (DELTASONE) 10 MG tablet, Take 1 tablet (10 mg total) by mouth daily with breakfast., Disp: 30 tablet, Rfl: 4  Current Facility-Administered Medications:    omalizumab Geoffry Paradise) prefilled syringe 300 mg, 300 mg, Subcutaneous, Q14 Days, Alfonse Spruce, MD, 300 mg at 11/24/22 1610  Facility-Administered Medications Ordered in Other Visits:    0.9 %  sodium chloride infusion, , Intravenous, Once, Doreatha Massed, MD   heparin lock flush 100 unit/mL, 500 Units, Intracatheter, Once PRN, Doreatha Massed, MD   pembrolizumab [facility]) 200 mg in sodium chloride 0.9 % 50 mL chemo infusion, 200 mg, Intravenous, Once, Doreatha Massed, MD   sodium chloride flush (NS) 0.9 % injection 10 mL, 10 mL, Intracatheter, PRN, Doreatha Massed, MD   Allergies: Allergies  Allergen Reactions   Ciprofloxacin Hives   Zofran  [Ondansetron] Rash    REVIEW OF SYSTEMS:   Review of Systems  Constitutional:  Negative for chills, fatigue and fever.  HENT:   Negative for lump/mass, mouth sores, nosebleeds, sore throat and trouble swallowing.   Eyes:  Negative for eye problems.  Respiratory:  Positive for cough and shortness of breath.   Cardiovascular:  Positive for leg swelling. Negative for chest pain and palpitations.  Gastrointestinal:  Negative for abdominal pain, constipation, diarrhea, nausea and vomiting.  Genitourinary:  Negative for bladder incontinence, difficulty urinating, dysuria, frequency, hematuria and nocturia.   Musculoskeletal:  Negative for arthralgias, back pain, flank pain, myalgias and neck pain.  Skin:  Negative for itching and rash.  Neurological:  Negative for dizziness, headaches and numbness.  Hematological:  Does not bruise/bleed easily.  Psychiatric/Behavioral:  Negative for depression, sleep disturbance and suicidal ideas. The patient is not nervous/anxious.   All other systems reviewed and are negative.    VITALS:   Blood pressure 122/73, pulse 71, temperature (!)  97 F (36.1 C), temperature source Tympanic, resp. rate 20, weight 176 lb (79.8 kg), SpO2 97%.  Wt Readings from Last 3 Encounters:  05/18/23 176 lb (79.8 kg)  04/06/23 174 lb 4 oz (79 kg)  03/16/23 173 lb (78.5 kg)    Body mass index is 24.55 kg/m.  Performance status (ECOG): 1 - Symptomatic but completely ambulatory  PHYSICAL EXAM:   Physical Exam Vitals and nursing note reviewed. Exam conducted with a chaperone present.  Constitutional:      Appearance: Normal appearance.  Cardiovascular:     Rate and Rhythm: Normal rate and regular rhythm.     Pulses: Normal pulses.     Heart sounds: Normal heart sounds.  Pulmonary:     Effort: Pulmonary effort is normal.     Breath sounds: Normal breath sounds.  Abdominal:     Palpations: Abdomen is soft. There is no hepatomegaly, splenomegaly or mass.      Tenderness: There is no abdominal tenderness.  Musculoskeletal:     Right lower leg: No edema.     Left lower leg: No edema.  Lymphadenopathy:     Cervical: No cervical adenopathy.     Right cervical: No superficial, deep or posterior cervical adenopathy.    Left cervical: No superficial, deep or posterior cervical adenopathy.     Upper Body:     Right upper body: No supraclavicular or axillary adenopathy.     Left upper body: No supraclavicular or axillary adenopathy.  Neurological:     General: No focal deficit present.     Mental Status: He is alert and oriented to person, place, and time.  Psychiatric:        Mood and Affect: Mood normal.        Behavior: Behavior normal.     LABS:      Latest Ref Rng & Units 05/18/2023   12:03 PM 04/27/2023   12:00 PM 04/06/2023   12:14 PM  CBC  WBC 4.0 - 10.5 K/uL 7.8  11.5  9.5   Hemoglobin 13.0 - 17.0 g/dL 82.9  56.2  13.0   Hematocrit 39.0 - 52.0 % 43.5  42.0  42.3   Platelets 150 - 400 K/uL 237  243  212       Latest Ref Rng & Units 05/18/2023   12:03 PM 04/27/2023   12:00 PM 04/06/2023   12:14 PM  CMP  Glucose 70 - 99 mg/dL 96  74  865   BUN 8 - 23 mg/dL 14  13  14    Creatinine 0.61 - 1.24 mg/dL 7.84  6.96  2.95   Sodium 135 - 145 mmol/L 142  137  139   Potassium 3.5 - 5.1 mmol/L 4.1  4.0  3.7   Chloride 98 - 111 mmol/L 108  103  106   CO2 22 - 32 mmol/L 29  28  25    Calcium 8.9 - 10.3 mg/dL 8.5  8.4  8.4   Total Protein 6.5 - 8.1 g/dL 5.6  5.4  5.2   Total Bilirubin 0.3 - 1.2 mg/dL 0.6  0.4  0.4   Alkaline Phos 38 - 126 U/L 52  51  47   AST 15 - 41 U/L 12  13  14    ALT 0 - 44 U/L 15  17  17       No results found for: "CEA1", "CEA" / No results found for: "CEA1", "CEA" No results found for: "PSA1" No results found for: "MWU132" No results  found for: "CAN125"  No results found for: "TOTALPROTELP", "ALBUMINELP", "A1GS", "A2GS", "BETS", "BETA2SER", "GAMS", "MSPIKE", "SPEI" Lab Results  Component Value Date   TIBC 249  03/20/2018   FERRITIN 759 (H) 03/20/2018   IRONPCTSAT 20 (L) 03/20/2018   Lab Results  Component Value Date   LDH 96 (L) 06/21/2022     STUDIES:   No results found.

## 2023-05-18 NOTE — Patient Instructions (Signed)

## 2023-05-18 NOTE — Patient Instructions (Signed)
[facility]  Discharge Instructions: Thank you for choosing [facility] to provide your oncology and hematology care.  If you have a lab appointment with the Cancer Center - please note that after April 8th, 2024, all labs will be drawn in the cancer center.  You do not have to check in or register with the main entrance as you have in the past but will complete your check-in in the cancer center.  Wear comfortable clothing and clothing appropriate for easy access to any Portacath or PICC line.   We strive to give you quality time with your provider. You may need to reschedule your appointment if you arrive late (15 or more minutes).  Arriving late affects you and other patients whose appointments are after yours.  Also, if you miss three or more appointments without notifying the office, you may be dismissed from the clinic at the provider's discretion.      For prescription refill requests, have your pharmacy contact our office and allow 72 hours for refills to be completed.    Today you received the following chemotherapy and/or immunotherapy agents Keytruda      To help prevent nausea and vomiting after your treatment, we encourage you to take your nausea medication as directed.  BELOW ARE SYMPTOMS THAT SHOULD BE REPORTED IMMEDIATELY: *FEVER GREATER THAN 100.4 F (38 C) OR HIGHER *CHILLS OR SWEATING *NAUSEA AND VOMITING THAT IS NOT CONTROLLED WITH YOUR NAUSEA MEDICATION *UNUSUAL SHORTNESS OF BREATH *UNUSUAL BRUISING OR BLEEDING *URINARY PROBLEMS (pain or burning when urinating, or frequent urination) *BOWEL PROBLEMS (unusual diarrhea, constipation, pain near the anus) TENDERNESS IN MOUTH AND THROAT WITH OR WITHOUT PRESENCE OF ULCERS (sore throat, sores in mouth, or a toothache) UNUSUAL RASH, SWELLING OR PAIN  UNUSUAL VAGINAL DISCHARGE OR ITCHING   Items with * indicate a potential emergency and should be followed up as soon as possible or go to the  Emergency Department if any problems should occur.  Please show the CHEMOTHERAPY ALERT CARD or IMMUNOTHERAPY ALERT CARD at check-in to the Emergency Department and triage nurse.  Should you have questions after your visit or need to cancel or reschedule your appointment, please contact [facility] [phone]  and follow the prompts.  Office hours are 8:00 a.m. to 4:30 p.m. Monday - Friday. Please note that voicemails left after 4:00 p.m. may not be returned until the following business day.  We are closed weekends and major holidays. You have access to a nurse at all times for urgent questions. Please call the main number to the clinic [phone] and follow the prompts.  For any non-urgent questions, you may also contact your provider using MyChart. We now offer e-Visits for anyone 18 and older to request care online for non-urgent symptoms. For details visit [email or web address].   Also download the MyChart app! Go to the app store, search "MyChart", open the app, select [place], and log in with your MyChart username and password.   

## 2023-05-18 NOTE — Progress Notes (Signed)
Patient presents today for Keytruda infusion per providers order.  Vital signs and labs reviewed by MD.  Patient has no new complaints at this time.    Peripheral IV started and blood return noted pre and post infusion.  Stable during infusion without adverse affects.  Vital signs stable.  No complaints at this time.  Discharge from clinic ambulatory in stable condition.  Alert and oriented X 3.  Follow up with [facility] as scheduled.

## 2023-05-25 ENCOUNTER — Ambulatory Visit (HOSPITAL_COMMUNITY)
Admission: RE | Admit: 2023-05-25 | Discharge: 2023-05-25 | Disposition: A | Discharge status: Home / Self Care | Payer: Medicare Other | Source: Ambulatory Visit | Attending: Hematology | Admitting: Hematology

## 2023-05-25 ENCOUNTER — Encounter (HOSPITAL_COMMUNITY): Payer: Self-pay

## 2023-05-25 ENCOUNTER — Other Ambulatory Visit: Payer: Self-pay | Admitting: *Deleted

## 2023-05-25 ENCOUNTER — Ambulatory Visit (HOSPITAL_COMMUNITY)
Admission: RE | Admit: 2023-05-25 | Discharge: 2023-05-25 | Disposition: A | Discharge status: Home / Self Care | Payer: Medicare Other | Source: Ambulatory Visit | Attending: Physician Assistant | Admitting: Physician Assistant

## 2023-05-25 DIAGNOSIS — Z48813 Encounter for surgical aftercare following surgery on the respiratory system: Secondary | ICD-10-CM | POA: Diagnosis not present

## 2023-05-25 DIAGNOSIS — C679 Malignant neoplasm of bladder, unspecified: Secondary | ICD-10-CM | POA: Diagnosis not present

## 2023-05-25 DIAGNOSIS — J90 Pleural effusion, not elsewhere classified: Secondary | ICD-10-CM

## 2023-05-25 MED ORDER — LIDOCAINE HCL (PF) 2 % IJ SOLN
10.0000 mL | Freq: Once | INTRAMUSCULAR | Status: AC
  Administered 2023-05-25: 10 mL

## 2023-05-25 NOTE — Progress Notes (Signed)
Patient tolerated right sided thoracentesis procedure well today and 2.1 Liters of clear yellow fluid removed. Patient verbalized understanding of discharge instructions and taken to chest xray post procedure via wheelchair with no acute distress noted.

## 2023-05-25 NOTE — Procedures (Signed)
PROCEDURE SUMMARY:  Successful image-guided right thoracentesis. Yielded 2.1 liters of clear yellow fluid. Patient tolerated procedure well. EBL: Zero No immediate complications.  Specimen was not sent for labs. Post procedure CXR shows no pneumothorax.  Please see imaging section of Epic for full dictation.  Villa Herb PA-C 05/25/2023 9:55 AM

## 2023-05-27 ENCOUNTER — Other Ambulatory Visit: Payer: Self-pay | Admitting: Hematology

## 2023-05-27 ENCOUNTER — Ambulatory Visit (HOSPITAL_COMMUNITY)
Admission: RE | Admit: 2023-05-27 | Discharge: 2023-05-27 | Disposition: A | Discharge status: Home / Self Care | Payer: Medicare Other | Source: Ambulatory Visit | Attending: Hematology | Admitting: Hematology

## 2023-05-27 ENCOUNTER — Encounter (HOSPITAL_COMMUNITY): Payer: Self-pay

## 2023-05-27 ENCOUNTER — Encounter: Payer: Self-pay | Admitting: Hematology

## 2023-05-27 DIAGNOSIS — J90 Pleural effusion, not elsewhere classified: Secondary | ICD-10-CM

## 2023-05-27 DIAGNOSIS — Z8551 Personal history of malignant neoplasm of bladder: Secondary | ICD-10-CM | POA: Diagnosis not present

## 2023-05-27 MED ORDER — LIDOCAINE HCL (PF) 2 % IJ SOLN
INTRAMUSCULAR | Status: AC
  Filled 2023-05-27: qty 20

## 2023-05-31 ENCOUNTER — Encounter: Payer: Self-pay | Admitting: Internal Medicine

## 2023-05-31 ENCOUNTER — Ambulatory Visit: Payer: Medicare Other | Attending: Internal Medicine | Admitting: Internal Medicine

## 2023-05-31 VITALS — BP 118/78 | HR 74 | Ht 71.5 in | Wt 176.0 lb

## 2023-05-31 DIAGNOSIS — I480 Paroxysmal atrial fibrillation: Secondary | ICD-10-CM | POA: Diagnosis not present

## 2023-05-31 MED ORDER — APIXABAN 5 MG PO TABS: 5.0000 mg | ORAL_TABLET | Freq: Two times a day (BID) | ORAL | 0 refills | Status: DC

## 2023-05-31 NOTE — Progress Notes (Signed)
HPI [name] returns for ongoing evaluation of atrial fib with a CVR. He is a pleasant 82 yo man with bladder CA who was found to have atrial fib with a CVR. He was asymptomatic. He was started on eliquis and after about a month developed hematuria and was treated for a UTI. His hematuria resolved. He feels well and cannot tell when he is in vs out of rhythm. He denies chest pain, sob or syncope.   Allergies  Allergen Reactions   Ciprofloxacin Hives   Zofran [Ondansetron] Rash     Current Outpatient Medications  Medication Sig Dispense Refill   acetaminophen (TYLENOL) 500 MG tablet Take 1,000 mg by mouth every 6 (six) hours as needed for mild pain, fever, moderate pain or headache.     apixaban (ELIQUIS) 5 MG TABS tablet Take 1 tablet (5 mg total) by mouth 2 (two) times daily. 60 tablet 2   budesonide-formoterol (SYMBICORT) 160-4.5 MCG/ACT inhaler Inhale 2 puffs into the lungs 2 (two) times daily. 1 each 6   chlorpheniramine-HYDROcodone (TUSSIONEX) 10-8 MG/5ML Take 5 mLs by mouth every 12 (twelve) hours as needed (severe cough). 70 mL 0   dextromethorphan-guaiFENesin (MUCINEX DM) 30-600 MG 12hr tablet Take 1 tablet by mouth 2 (two) times daily.     hydrOXYzine (ATARAX) 25 MG tablet Take 1 tablet (25 mg total) by mouth 3 (three) times daily as needed. 90 tablet 1   methenamine (HIPREX) 1 g tablet Take 1 tablet (1 g total) by mouth 2 (two) times daily. 60 tablet 11   methylphenidate (RITALIN) 10 MG tablet Take 1 tablet (10 mg total) by mouth daily as needed. 30 tablet 0   metoprolol tartrate (LOPRESSOR) 25 MG tablet Take 1 tablet (25 mg total) by mouth 2 (two) times daily. 60 tablet 2   pantoprazole (PROTONIX) 40 MG tablet Take 1 tablet (40 mg total) by mouth 2 (two) times daily. 60 tablet 0   predniSONE (DELTASONE) 10 MG tablet Take 1 tablet (10 mg total) by mouth daily with breakfast. 30 tablet 4   betamethasone dipropionate 0.05 % cream Apply topically 2 (two) times daily. (Patient  not taking: Reported on 05/31/2023) 60 g 2   megestrol (MEGACE) 400 MG/10ML suspension Take 10 mLs (400 mg total) by mouth 2 (two) times daily. (Patient not taking: Reported on 05/31/2023) 480 mL 3   Current Facility-Administered Medications  Medication Dose Route Frequency Provider Last Rate Last Admin   omalizumab Geoffry Paradise) prefilled syringe 300 mg  300 mg Subcutaneous Q14 Days Alfonse Spruce, MD   300 mg at 11/24/22 1610     Past Medical History:  Diagnosis Date   Bladder cancer (HCC)    Bone cancer (HCC)    BPH (benign prostatic hypertrophy)    Dysuria    GERD (gastroesophageal reflux disease)    History of adenomatous polyp of colon    tubular adenoma 06/ 2018   History of bladder cancer 12/2014   urologist-  dr Sherryl Barters--  dx High Grade TCC without stromal invasion s/p TURBT and intravesical BCG tx/  recurrent 07/2015 (pTa) TCC  repear intravesical BCG tx    History of hiatal hernia    History of urethral stricture    Nocturia     ROS:   All systems reviewed and negative except as noted in the HPI.   Past Surgical History:  Procedure Laterality Date   CARDIAC CATHETERIZATION  1997   normal coronary arteries   CARDIOVASCULAR STRESS TEST  12-14-2005   Low risk perfusion study/  very small scar in the inferior septum from mid ventricle to apex,  no ischemia/  mild inferior septal hypokinesis/  ef 58%   CATARACT EXTRACTION W/ INTRAOCULAR LENS  IMPLANT, BILATERAL  2011   COLONOSCOPY  last one 06/ 2018   CYSTOSCOPY WITH BIOPSY N/A 08/12/2015   Procedure: CYSTOSCOPY WITH BIOPSY AND FULGERATION;  Surgeon: Hildred Laser, MD;  Location: [facility];  Service: Urology;  Laterality: N/A;   CYSTOSCOPY WITH INJECTION N/A 06/29/2017   Procedure: CYSTOSCOPY WITH INJECTION OF INDOCYANINE GREEN DYE;  Surgeon: Sebastian Ache, MD;  Location: [facility];  Service: Urology;  Laterality: N/A;   CYSTOSCOPY WITH URETHRAL DILATATION N/A 03/21/2017   Procedure: CYSTOSCOPY;   Surgeon: Hildred Laser, MD;  Location: [facility];  Service: Urology;  Laterality: N/A;   ESOPHAGOGASTRODUODENOSCOPY  12-05-2014   IR IMAGING GUIDED PORT INSERTION  02/07/2018   IR NEPHROSTOMY PLACEMENT LEFT  12/25/2021   IR REMOVAL TUN ACCESS W/ PORT W/O FL MOD SED  04/14/2018   TRANSTHORACIC ECHOCARDIOGRAM  01-31-2008   nomral LVF,  ef 55-65%/  mild pulmonic stenosis without regurg.,  peak transpulomonic valve gradient   TRANSURETHRAL RESECTION OF BLADDER TUMOR N/A 12/31/2014   Procedure: TRANSURETHRAL RESECTION OF BLADDER TUMOR (TURBT);  Surgeon: Su Grand, MD;  Location: [facility];  Service: Urology;  Laterality: N/A;   TRANSURETHRAL RESECTION OF BLADDER TUMOR N/A 03/21/2017   Procedure: POSSIBLE TRANSURETHRAL RESECTION OF BLADDER TUMOR (TURBT);  Surgeon: Hildred Laser, MD;  Location: [facility];  Service: Urology;  Laterality: N/A;     Family History  Problem Relation Age of Onset   Alcoholism Father    Colon cancer Neg Hx    Colon polyps Neg Hx    Diabetes Neg Hx    Kidney disease Neg Hx    Esophageal cancer Neg Hx    Heart disease Neg Hx    Gallbladder disease Neg Hx    Allergic rhinitis Neg Hx    Angioedema Neg Hx    Asthma Neg Hx    Atopy Neg Hx    Eczema Neg Hx    Immunodeficiency Neg Hx    Urticaria Neg Hx      Social History   Socioeconomic History   Marital status: Married    Spouse name: Not on file   Number of children: 1   Years of education: Not on file   Highest education level: Not on file  Occupational History   Occupation: Retired  Tobacco Use   Smoking status: Former    Current packs/day: 0.00    Average packs/day: 1 pack/day for 34.0 years (34.0 ttl pk-yrs)    Types: Cigarettes    Start date: 12/27/1954    Quit date: 12/26/1988    Years since quitting: 34.4   Smokeless tobacco: Never  Vaping Use   Vaping status: Never Used  Substance and Sexual Activity   Alcohol use: Yes     Alcohol/week: 6.0 standard drinks of alcohol    Types: 6 Cans of beer per week    Comment: BEER   Drug use: No   Sexual activity: Not Currently  Other Topics Concern   Not on file  Social History Narrative   Not on file   Social Determinants of Health   Financial Resource Strain: Not on file  Food Insecurity: No Food Insecurity (12/20/2022)   Hunger Vital Sign    Worried About  Running Out of Food in the Last Year: Never true    Ran Out of Food in the Last Year: Never true  Transportation Needs: No Transportation Needs (12/20/2022)   PRAPARE - Administrator, Civil Service (Medical): No    Lack of Transportation (Non-Medical): No  Physical Activity: Not on file  Stress: Not on file  Social Connections: Unknown (12/28/2021)   [facility]   Social Network    Social Network: Not on file  Intimate Partner Violence: Not At Risk (12/20/2022)   Humiliation, Afraid, Rape, and Kick questionnaire    Fear of Current or Ex-Partner: No    Emotionally Abused: No    Physically Abused: No    Sexually Abused: No     BP 118/78 (BP Location: Left Arm)   Pulse 74   Ht 5' 11.5" (1.816 m)   Wt 176 lb (79.8 kg)   SpO2 96%   BMI 24.20 kg/m   Physical Exam:  Well appearing NAD HEENT: Unremarkable Neck:  No JVD, no thyromegally Lymphatics:  No adenopathy Back:  No CVA tenderness Lungs:  Clear HEART:  Regular rate rhythm, no murmurs, no rubs, no clicks Abd:  soft, positive bowel sounds, no organomegally, no rebound, no guarding Ext:  2 plus pulses, no edema, no cyanosis, no clubbing Skin:  No rashes no nodules Neuro:  CN II through XII intact, motor grossly intact  Assess/Plan:  PAF - on exam he is back in rhythm. I asked him to continue his anti-coag. His rates are controlled. He would like to switch blood thinners to something cheaper. I offered him Pradaxa and he will think on this.  No indication for AA drug at this time as he is  asymptomatic. Coags - his bleeding has stopped. HTN -his bp is well controlled.   Sharlot Gowda Edin Kon,MD

## 2023-05-31 NOTE — Patient Instructions (Signed)
Medication Instructions:  Your physician recommends that you continue on your current medications as directed. Please refer to the Current Medication list given to you today.  *If you need a refill on your cardiac medications before your next appointment, please call your pharmacy*   Lab Work: NONE   If you have labs (blood work) drawn today and your tests are completely normal, you will receive your results only by: MyChart Message (if you have MyChart) OR A paper copy in the mail If you have any lab test that is abnormal or we need to change your treatment, we will call you to review the results.   Testing/Procedures: NONE    Follow-Up: At [facility], you and your health needs are our priority.  As part of our continuing mission to provide you with exceptional heart care, we have created designated Provider Care Teams.  These Care Teams include your primary Cardiologist (physician) and Advanced Practice Providers (APPs -  Physician Assistants and Nurse Practitioners) who all work together to provide you with the care you need, when you need it.  We recommend signing up for the patient portal called "MyChart".  Sign up information is provided on this After Visit Summary.  MyChart is used to connect with patients for Virtual Visits (Telemedicine).  Patients are able to view lab/test results, encounter notes, upcoming appointments, etc.  Non-urgent messages can be sent to your provider as well.   To learn more about what you can do with MyChart, go to [email or web address].    Your next appointment:    June   Provider:   Lewayne Bunting, MD    Other Instructions Thank you for choosing [facility]!

## 2023-06-06 ENCOUNTER — Other Ambulatory Visit: Payer: Self-pay

## 2023-06-06 ENCOUNTER — Other Ambulatory Visit: Payer: Self-pay | Admitting: Hematology

## 2023-06-06 DIAGNOSIS — C679 Malignant neoplasm of bladder, unspecified: Secondary | ICD-10-CM

## 2023-06-06 NOTE — Progress Notes (Signed)
Lab orders entered

## 2023-06-07 ENCOUNTER — Inpatient Hospital Stay: Payer: Medicare Other

## 2023-06-07 ENCOUNTER — Inpatient Hospital Stay: Payer: Medicare Other | Admitting: Hematology

## 2023-06-07 VITALS — BP 107/59 | HR 70 | Temp 96.9°F | Resp 16 | Wt 174.5 lb

## 2023-06-07 DIAGNOSIS — C7802 Secondary malignant neoplasm of left lung: Secondary | ICD-10-CM | POA: Diagnosis not present

## 2023-06-07 DIAGNOSIS — C679 Malignant neoplasm of bladder, unspecified: Secondary | ICD-10-CM

## 2023-06-07 DIAGNOSIS — C7801 Secondary malignant neoplasm of right lung: Secondary | ICD-10-CM | POA: Diagnosis not present

## 2023-06-07 DIAGNOSIS — Z5112 Encounter for antineoplastic immunotherapy: Secondary | ICD-10-CM | POA: Diagnosis not present

## 2023-06-07 DIAGNOSIS — C7951 Secondary malignant neoplasm of bone: Secondary | ICD-10-CM | POA: Diagnosis not present

## 2023-06-07 DIAGNOSIS — Z23 Encounter for immunization: Secondary | ICD-10-CM | POA: Diagnosis not present

## 2023-06-07 LAB — COMPREHENSIVE METABOLIC PANEL
ALT: 17 U/L (ref 0–44)
AST: 17 U/L (ref 15–41)
Albumin: 2.5 g/dL — ABNORMAL LOW (ref 3.5–5.0)
Alkaline Phosphatase: 60 U/L (ref 38–126)
Anion gap: 9 (ref 5–15)
BUN: 15 mg/dL (ref 8–23)
CO2: 22 mmol/L (ref 22–32)
Calcium: 8.3 mg/dL — ABNORMAL LOW (ref 8.9–10.3)
Chloride: 108 mmol/L (ref 98–111)
Creatinine, Ser: 1.2 mg/dL (ref 0.61–1.24)
GFR, Estimated: >60 mL/min (ref >60)
Glucose, Bld: 77 mg/dL (ref 70–99)
Potassium: 3.6 mmol/L (ref 3.5–5.1)
Sodium: 139 mmol/L (ref 135–145)
Total Bilirubin: 0.5 mg/dL (ref 0.3–1.2)
Total Protein: 4.9 g/dL — ABNORMAL LOW (ref 6.5–8.1)

## 2023-06-07 LAB — CBC WITH DIFFERENTIAL/PLATELET
Abs Immature Granulocytes: 0.06 10*3/uL (ref 0.00–0.07)
Basophils Absolute: 0.1 10*3/uL (ref 0.0–0.1)
Basophils Relative: 1 %
Eosinophils Absolute: 0.3 10*3/uL (ref 0.0–0.5)
Eosinophils Relative: 2 %
HCT: 43.4 % (ref 39.0–52.0)
Hemoglobin: 14.3 g/dL (ref 13.0–17.0)
Immature Granulocytes: 1 %
Lymphocytes Relative: 7 %
Lymphs Abs: 0.8 10*3/uL (ref 0.7–4.0)
MCH: 35.9 pg — ABNORMAL HIGH (ref 26.0–34.0)
MCHC: 32.9 g/dL (ref 30.0–36.0)
MCV: 109 fL — ABNORMAL HIGH (ref 80.0–100.0)
Monocytes Absolute: 1.2 10*3/uL — ABNORMAL HIGH (ref 0.1–1.0)
Monocytes Relative: 10 %
Neutro Abs: 8.9 10*3/uL — ABNORMAL HIGH (ref 1.7–7.7)
Neutrophils Relative %: 79 %
Platelets: 230 10*3/uL (ref 150–400)
RBC: 3.98 MIL/uL — ABNORMAL LOW (ref 4.22–5.81)
RDW: 12.8 % (ref 11.5–15.5)
WBC: 11.3 10*3/uL — ABNORMAL HIGH (ref 4.0–10.5)
nRBC: 0 % (ref 0.0–0.2)

## 2023-06-07 LAB — MAGNESIUM: Magnesium: 2.2 mg/dL (ref 1.7–2.4)

## 2023-06-07 MED ORDER — SODIUM CHLORIDE 0.9 % IV SOLN: Freq: Once | INTRAVENOUS | Status: AC

## 2023-06-07 MED ORDER — INFLUENZA VAC A&B SURF ANT ADJ 0.5 ML IM SUSY
0.5000 mL | PREFILLED_SYRINGE | Freq: Once | INTRAMUSCULAR | Status: AC
  Administered 2023-06-07: 0.5 mL via INTRAMUSCULAR
  Filled 2023-06-07: qty 0.5

## 2023-06-07 MED ORDER — SODIUM CHLORIDE 0.9 % IV SOLN
200.0000 mg | Freq: Once | INTRAVENOUS | Status: AC
  Administered 2023-06-07: 200 mg via INTRAVENOUS
  Filled 2023-06-07: qty 8

## 2023-06-07 NOTE — Patient Instructions (Signed)
[facility]  Discharge Instructions: Thank you for choosing [facility] to provide your oncology and hematology care.  If you have a lab appointment with the Cancer Center - please note that after April 8th, 2024, all labs will be drawn in the cancer center.  You do not have to check in or register with the main entrance as you have in the past but will complete your check-in in the cancer center.  Wear comfortable clothing and clothing appropriate for easy access to any Portacath or PICC line.   We strive to give you quality time with your provider. You may need to reschedule your appointment if you arrive late (15 or more minutes).  Arriving late affects you and other patients whose appointments are after yours.  Also, if you miss three or more appointments without notifying the office, you may be dismissed from the clinic at the provider's discretion.      For prescription refill requests, have your pharmacy contact our office and allow 72 hours for refills to be completed.    Today you received the following chemotherapy and/or immunotherapy agents Keytruda      To help prevent nausea and vomiting after your treatment, we encourage you to take your nausea medication as directed.  BELOW ARE SYMPTOMS THAT SHOULD BE REPORTED IMMEDIATELY: *FEVER GREATER THAN 100.4 F (38 C) OR HIGHER *CHILLS OR SWEATING *NAUSEA AND VOMITING THAT IS NOT CONTROLLED WITH YOUR NAUSEA MEDICATION *UNUSUAL SHORTNESS OF BREATH *UNUSUAL BRUISING OR BLEEDING *URINARY PROBLEMS (pain or burning when urinating, or frequent urination) *BOWEL PROBLEMS (unusual diarrhea, constipation, pain near the anus) TENDERNESS IN MOUTH AND THROAT WITH OR WITHOUT PRESENCE OF ULCERS (sore throat, sores in mouth, or a toothache) UNUSUAL RASH, SWELLING OR PAIN  UNUSUAL VAGINAL DISCHARGE OR ITCHING   Items with * indicate a potential emergency and should be followed up as soon as possible or go to the  Emergency Department if any problems should occur.  Please show the CHEMOTHERAPY ALERT CARD or IMMUNOTHERAPY ALERT CARD at check-in to the Emergency Department and triage nurse.  Should you have questions after your visit or need to cancel or reschedule your appointment, please contact [facility] [phone]  and follow the prompts.  Office hours are 8:00 a.m. to 4:30 p.m. Monday - Friday. Please note that voicemails left after 4:00 p.m. may not be returned until the following business day.  We are closed weekends and major holidays. You have access to a nurse at all times for urgent questions. Please call the main number to the clinic [phone] and follow the prompts.  For any non-urgent questions, you may also contact your provider using MyChart. We now offer e-Visits for anyone 18 and older to request care online for non-urgent symptoms. For details visit [email or web address].   Also download the MyChart app! Go to the app store, search "MyChart", open the app, select [place], and log in with your MyChart username and password.   

## 2023-06-07 NOTE — Progress Notes (Signed)
Treatment given per orders. Patient tolerated it well without problems. Vitals stable and discharged home from clinic ambulatory. Follow up as scheduled.  

## 2023-06-23 ENCOUNTER — Ambulatory Visit (HOSPITAL_COMMUNITY)
Admission: RE | Admit: 2023-06-23 | Discharge: 2023-06-23 | Disposition: A | Discharge status: Home / Self Care | Payer: Medicare Other | Source: Ambulatory Visit | Attending: Hematology | Admitting: Hematology

## 2023-06-23 DIAGNOSIS — K7689 Other specified diseases of liver: Secondary | ICD-10-CM | POA: Diagnosis not present

## 2023-06-23 DIAGNOSIS — J90 Pleural effusion, not elsewhere classified: Secondary | ICD-10-CM | POA: Diagnosis not present

## 2023-06-23 DIAGNOSIS — C679 Malignant neoplasm of bladder, unspecified: Secondary | ICD-10-CM | POA: Insufficient documentation

## 2023-06-23 DIAGNOSIS — C7951 Secondary malignant neoplasm of bone: Secondary | ICD-10-CM | POA: Diagnosis present

## 2023-06-23 MED ORDER — FLUDEOXYGLUCOSE F - 18 (FDG) INJECTION
8.5500 | Freq: Once | INTRAVENOUS | Status: AC | PRN
  Administered 2023-06-23: 8.55 via INTRAVENOUS

## 2023-06-28 ENCOUNTER — Inpatient Hospital Stay (HOSPITAL_BASED_OUTPATIENT_CLINIC_OR_DEPARTMENT_OTHER): Payer: Medicare Other | Admitting: Hematology

## 2023-06-28 ENCOUNTER — Inpatient Hospital Stay: Payer: Medicare Other

## 2023-06-28 ENCOUNTER — Inpatient Hospital Stay: Payer: Medicare Other | Attending: Oncology

## 2023-06-28 VITALS — BP 141/77 | HR 61 | Temp 96.2°F | Resp 16 | Wt 178.8 lb

## 2023-06-28 VITALS — BP 119/59 | HR 68 | Temp 97.8°F | Resp 18

## 2023-06-28 DIAGNOSIS — Z5112 Encounter for antineoplastic immunotherapy: Secondary | ICD-10-CM | POA: Insufficient documentation

## 2023-06-28 DIAGNOSIS — C7801 Secondary malignant neoplasm of right lung: Secondary | ICD-10-CM | POA: Insufficient documentation

## 2023-06-28 DIAGNOSIS — C7802 Secondary malignant neoplasm of left lung: Secondary | ICD-10-CM | POA: Diagnosis not present

## 2023-06-28 DIAGNOSIS — J90 Pleural effusion, not elsewhere classified: Secondary | ICD-10-CM

## 2023-06-28 DIAGNOSIS — Z79620 Long term (current) use of immunosuppressive biologic: Secondary | ICD-10-CM | POA: Diagnosis not present

## 2023-06-28 DIAGNOSIS — C679 Malignant neoplasm of bladder, unspecified: Secondary | ICD-10-CM | POA: Insufficient documentation

## 2023-06-28 DIAGNOSIS — C7951 Secondary malignant neoplasm of bone: Secondary | ICD-10-CM | POA: Diagnosis not present

## 2023-06-28 LAB — COMPREHENSIVE METABOLIC PANEL
ALT: 15 U/L (ref 0–44)
AST: 15 U/L (ref 15–41)
Albumin: 2.6 g/dL — ABNORMAL LOW (ref 3.5–5.0)
Alkaline Phosphatase: 50 U/L (ref 38–126)
Anion gap: 9 (ref 5–15)
BUN: 16 mg/dL (ref 8–23)
CO2: 24 mmol/L (ref 22–32)
Calcium: 8.1 mg/dL — ABNORMAL LOW (ref 8.9–10.3)
Chloride: 105 mmol/L (ref 98–111)
Creatinine, Ser: 1.13 mg/dL (ref 0.61–1.24)
GFR, Estimated: >60 mL/min (ref >60)
Glucose, Bld: 89 mg/dL (ref 70–99)
Potassium: 3.5 mmol/L (ref 3.5–5.1)
Sodium: 138 mmol/L (ref 135–145)
Total Bilirubin: 0.6 mg/dL (ref <1.2)
Total Protein: 5.2 g/dL — ABNORMAL LOW (ref 6.5–8.1)

## 2023-06-28 LAB — CBC WITH DIFFERENTIAL/PLATELET
Abs Immature Granulocytes: 0.07 10*3/uL (ref 0.00–0.07)
Basophils Absolute: 0.1 10*3/uL (ref 0.0–0.1)
Basophils Relative: 1 %
Eosinophils Absolute: 0.2 10*3/uL (ref 0.0–0.5)
Eosinophils Relative: 2 %
HCT: 45.2 % (ref 39.0–52.0)
Hemoglobin: 14.3 g/dL (ref 13.0–17.0)
Immature Granulocytes: 1 %
Lymphocytes Relative: 10 %
Lymphs Abs: 1 10*3/uL (ref 0.7–4.0)
MCH: 34.7 pg — ABNORMAL HIGH (ref 26.0–34.0)
MCHC: 31.6 g/dL (ref 30.0–36.0)
MCV: 109.7 fL — ABNORMAL HIGH (ref 80.0–100.0)
Monocytes Absolute: 0.8 10*3/uL (ref 0.1–1.0)
Monocytes Relative: 9 %
Neutro Abs: 7.2 10*3/uL (ref 1.7–7.7)
Neutrophils Relative %: 77 %
Platelets: 206 10*3/uL (ref 150–400)
RBC: 4.12 MIL/uL — ABNORMAL LOW (ref 4.22–5.81)
RDW: 13.2 % (ref 11.5–15.5)
WBC: 9.3 10*3/uL (ref 4.0–10.5)
nRBC: 0 % (ref 0.0–0.2)

## 2023-06-28 LAB — TSH: TSH: 6.291 u[IU]/mL — ABNORMAL HIGH (ref 0.350–4.500)

## 2023-06-28 LAB — MAGNESIUM: Magnesium: 2.2 mg/dL (ref 1.7–2.4)

## 2023-06-28 MED ORDER — PEMBROLIZUMAB CHEMO INJECTION 100 MG/4ML
200.0000 mg | Freq: Once | INTRAVENOUS | Status: AC
  Administered 2023-06-28: 200 mg via INTRAVENOUS
  Filled 2023-06-28: qty 8

## 2023-06-28 MED ORDER — SODIUM CHLORIDE 0.9 % IV SOLN: Freq: Once | INTRAVENOUS | Status: AC

## 2023-06-28 NOTE — Patient Instructions (Signed)
[facility] at [facility] Discharge Instructions   You were seen and examined today by Dr. Ellin Saba.  He reviewed the results of your lab work which are normal/stable.   He reviewed the results of your PET scan which did not show any evidence of cancer. It did show some fluid on the lungs.   We will proceed with your treatment today.   Return as scheduled.    Thank you for choosing  Cancer Center at [facility] to provide your oncology and hematology care.  To afford each patient quality time with our provider, please arrive at least 15 minutes before your scheduled appointment time.   If you have a lab appointment with the Cancer Center please come in thru the Main Entrance and check in at the main information desk.  You need to re-schedule your appointment should you arrive 10 or more minutes late.  We strive to give you quality time with our providers, and arriving late affects you and other patients whose appointments are after yours.  Also, if you no show three or more times for appointments you may be dismissed from the clinic at the providers discretion.     Again, thank you for choosing [facility].  Our hope is that these requests will decrease the amount of time that you wait before being seen by our physicians.       _____________________________________________________________  Should you have questions after your visit to [facility], please contact our office at [phone] and follow the prompts.  Our office hours are 8:00 a.m. and 4:30 p.m. Monday - Friday.  Please note that voicemails left after 4:00 p.m. may not be returned until the following business day.  We are closed weekends and major holidays.  You do have access to a nurse 24-7, just call the main number to the clinic [phone] and do not press any options, hold on the line and a nurse will answer the phone.    For prescription refill  requests, have your pharmacy contact our office and allow 72 hours.    Due to Covid, you will need to wear a mask upon entering the hospital. If you do not have a mask, a mask will be given to you at the Main Entrance upon arrival. For doctor visits, patients may have 1 support person age 28 or older with them. For treatment visits, patients can not have anyone with them due to social distancing guidelines and our immunocompromised population.

## 2023-06-28 NOTE — Progress Notes (Signed)
Patient has been examined by Dr. Katragadda. Vital signs and labs have been reviewed by MD - ANC, Creatinine, LFTs, hemoglobin, and platelets are within treatment parameters per M.D. - pt may proceed with treatment.  Primary RN and pharmacy notified.  

## 2023-06-28 NOTE — Progress Notes (Signed)
Patient presents today for treatment [name]) and follow up visit with Dr. Ellin Saba. Labs and vital signs within parameters for treatment. Patient has no complaints today pertaining to

## 2023-06-28 NOTE — Progress Notes (Signed)
[facility] [address] [place] [address]    Clinic Day:  06/28/2023  Referring physician: Elfredia Nevins, MD  Patient Care Team: Elfredia Nevins, MD as PCP - General (Internal Medicine) Marinus Maw, MD as PCP - Electrophysiology (Cardiology) Doreatha Massed, MD as Medical Oncologist (Medical Oncology) Therese Sarah, RN as Oncology Nurse Navigator (Medical Oncology)   ASSESSMENT & PLAN:   Assessment: 1.  Metastatic urothelial carcinoma of the bladder to the lungs and bones: - Neoadjuvant chemotherapy with gemcitabine and cisplatin cycle 1 on 05/02/2017 - Cystoprostatectomy, bilateral lymphadenectomy by Dr. Berneice Heinrich on 06/29/2017, pathology T3b N0, 0/13 lymph nodes - Developed metastatic disease in April 2019, biopsy of the right ischium on 12/08/2017 with poorly differentiated carcinoma consistent with metastatic urothelial carcinoma. - XRT to the pelvis completed on 01/16/2018 - Carboplatin and gemcitabine 6 cycles from 01/30/2018 through 05/22/2018 - Pembrolizumab every 3 weeks started on 06/21/2018, discontinued in April 2020 due to skin toxicity - XRT to the right paratracheal lymph node, 50 Gray in 10 fractions completed on 04/23/2020 - XRT to the intrathoracic lymph node completed on 11/27/2021  - 12/20/2022: Right thoracentesis with 1.8 L removed - 12/21/2022: Left thoracentesis with 700 mL fluid removed, cytology negative. - Unclear etiology of recurrent effusions, exudative. - 2D echocardiogram: Normal LVEF with grade 1 diastolic dysfunction. - CT angiogram 12/20/2022: Negative for PE.  Multifocal parenchymal opacities.  No adenopathy. - Keytruda started on 01/07/2023.   2.  Social/family history: - Lives at home with his wife. - Worked for [employer].  Quit smoking 40 years ago. - No family history of malignancies.    Plan: 1.  Metastatic urothelial carcinoma of the bladder to the lungs and bones: - 8 cycles of Keytruda completed  on 06/07/2023. - Last right thoracentesis on 05/25/2023-: 2.1 L removed. - PET scan (06/23/2023): No evidence of recurrence in the chest, abdomen or pelvis.  No evidence of active skeletal metastatic disease.  Stable large right pleural effusion and small left pleural effusion. - We have discussed the option of Pleurx catheter given recurrent pleural effusion from malignancy.  He would like to continue thoracentesis as needed for now and will think about the Pleurx catheter. - He can have thoracentesis every 4 to 6 weeks as needed. - Reviewed labs today: Almond 2.6 and rest of LFTs are normal.  CBC was grossly normal.  TSH is 6.2, Previously 5.9.  Will closely monitor. - Proceed with Keytruda every 3 weeks.  RTC 9 weeks for follow-up.   2.  Dry cough: - Cough with clear mucus is stable.  He is using inhaler which is helping.  He reports that cough gets better after thoracentesis.   3.  Loss of appetite/weight loss: - He is eating better and continuing to gain weight.   4.  Immunotherapy induced skin rash: - Rash and itching has improved since prednisone started.  Continue prednisone 10 mg daily.   5.  Cancer-related fatigue: - Continue Ritalin 10 mg in the mornings as needed which is helping with his fatigue.    Orders Placed This Encounter  Procedures   US THORACENTESIS ASP PLEURAL SPACE W/IMG GUIDE    Standing Status:   Standing    Number of Occurrences:   20    Standing Expiration Date:   06/27/2024    Order Specific Question:   Are labs required for specimen collection?    Answer:   No    Order Specific Question:  Reason for Exam (SYMPTOM  OR DIAGNOSIS REQUIRED)    Answer:   malignant pleural effusion    Order Specific Question:   Preferred imaging location?    Answer:   [facility]      I,Helena R Teague,acting as a scribe for Doreatha Massed, MD.,have documented all relevant documentation on the behalf of Doreatha Massed, MD,as directed by  Doreatha Massed, MD while in the presence of Doreatha Massed, MD.  I, Doreatha Massed MD, have reviewed the above documentation for accuracy and completeness, and I agree with the above.    Doreatha Massed, MD   11/12/202412:44 PM  CHIEF COMPLAINT:   Diagnosis: Metastatic urothelial carcinoma of the bladder    Cancer Staging  No matching staging information was found for the patient.    Prior Therapy: 1. Neoadjuvant gemcitabine/cisplatin on 05/02/17 (one cycle) 2. Cystoprostatectomy and bilateral lymphadenectomy on 06/29/2017 (Dr. Berneice Heinrich) 3. Radiation therapy to the pelvic lesion 01/02/18 - 01/16/18 4. Carboplatin and gemcitabine, 6 cycles 02/06/18 - 05/22/18 5. Pembrolizumab 06/21/18 - 12/13/18 6. Radiation therapy to RUL and right paratracheal nodes 04/09/20 - 04/23/20 7. Radiation therapy to intrathoracic lymph nodes 11/16/21 - 11/27/21  Current Therapy:  pembrolizumab    HISTORY OF PRESENT ILLNESS:   Oncology History  Stage IV malignant neoplasm of urinary bladder /with mets to the bones and lungs  04/20/2017 Initial Diagnosis   Malignant neoplasm of urinary bladder (HCC)   01/30/2018 - 05/22/2018 Chemotherapy   The patient had palonosetron (ALOXI) injection 0.25 mg, 0.25 mg, Intravenous,  Once, 6 of 6 cycles Administration: 0.25 mg (01/30/2018), 0.25 mg (02/20/2018), 0.25 mg (03/13/2018), 0.25 mg (04/03/2018), 0.25 mg (05/01/2018), 0.25 mg (05/22/2018) CARBOplatin (PARAPLATIN) 400 mg in sodium chloride 0.9 % 250 mL chemo infusion, 400 mg (100 % of original dose 400 mg), Intravenous,  Once, 6 of 6 cycles Dose modification: 400 mg (original dose 400 mg, Cycle 1),   (original dose 400 mg, Cycle 2, Reason: Patient Age), 410 mg (original dose 400 mg, Cycle 6) Administration: 400 mg (01/30/2018), 400 mg (02/20/2018), 400 mg (03/13/2018), 410 mg (04/03/2018), 410 mg (05/01/2018), 410 mg (05/22/2018) gemcitabine (GEMZAR) 2,128 mg in sodium chloride 0.9 % 250 mL chemo infusion, 1,000 mg/m2 = 2,128 mg,  Intravenous,  Once, 6 of 6 cycles Dose modification: 800 mg/m2 (original dose 1,000 mg/m2, Cycle 4, Reason: Provider Judgment), 800 mg/m2 (original dose 1,000 mg/m2, Cycle 4, Reason: Provider Judgment) Administration: 2,128 mg (01/30/2018), 2,128 mg (02/06/2018), 2,128 mg (02/20/2018), 2,128 mg (02/27/2018), 2,128 mg (03/13/2018), 1,710 mg (04/03/2018), 1,710 mg (04/10/2018), 1,710 mg (05/01/2018), 1,710 mg (05/22/2018) fosaprepitant (EMEND) 150 mg, dexamethasone (DECADRON) 12 mg in sodium chloride 0.9 % 145 mL IVPB, , Intravenous,  Once, 6 of 6 cycles Administration:  (01/30/2018),  (02/20/2018),  (03/13/2018),  (04/03/2018),  (05/01/2018),  (05/22/2018)  for chemotherapy treatment.    01/07/2023 -  Chemotherapy   Patient is on Treatment Plan : BLADDER Pembrolizumab (200) q21d     Bladder cancer (HCC)  06/28/2017 Initial Diagnosis   Bladder cancer (HCC)   06/21/2018 - 12/13/2018 Chemotherapy   The patient had pembrolizumab (KEYTRUDA) 200 mg in sodium chloride 0.9 % 50 mL chemo infusion, 200 mg, Intravenous, Once, 8 of 9 cycles Administration: 200 mg (06/21/2018), 200 mg (07/12/2018), 200 mg (08/02/2018), 200 mg (08/23/2018), 200 mg (09/13/2018), 200 mg (10/04/2018), 200 mg (11/22/2018), 200 mg (12/13/2018)  for chemotherapy treatment.       INTERVAL HISTORY:   [name] is a 82 y.o. male presenting to clinic  today for follow up of Metastatic urothelial carcinoma of the bladder. He was last seen by me on 05/18/23.  Since his last visit, he underwent restaging PET on 06/23/23 that found: no evidence of recurrent neoplasm in the neck, chest, abdomen, or pelvis; no evidence of active skeletal metastasis; stable large RIGHT pleural effusion and small LEFT effusion; and non metabolic erosive and expansile in the inferior RIGHT pubic ramus unchanged from prior. His last right thoracentesis was on 05/25/23 with 2.1 L removed.   Today, he states that he is doing well overall. His appetite level is at 75%. His energy level is at  75%. He is accompanied by his wife. His wife reports he is consistently coughing and produces phlegm. He also notes SOB. After his last pleural effusion, his cough improved, though it worsened 2 days later. He denies any skin rashes and is taking prednisone as prescribed. He is taking Ritalin as prescribed, which is effective in giving him energy. He is not taking cough medication, though he has been using an inhaler.   PAST MEDICAL HISTORY:  0 Past Medical History: Past Medical History:  Diagnosis Date   Bladder cancer (HCC)    Bone cancer (HCC)    BPH (benign prostatic hypertrophy)    Dysuria    GERD (gastroesophageal reflux disease)    History of adenomatous polyp of colon    tubular adenoma 06/ 2018   History of bladder cancer 12/2014   urologist-  dr Sherryl Barters--  dx High Grade TCC without stromal invasion s/p TURBT and intravesical BCG tx/  recurrent 07/2015 (pTa) TCC  repear intravesical BCG tx    History of hiatal hernia    History of urethral stricture    Nocturia     Surgical History: Past Surgical History:  Procedure Laterality Date   CARDIAC CATHETERIZATION  1997   normal coronary arteries   CARDIOVASCULAR STRESS TEST  12-14-2005   Low risk perfusion study/  very small scar in the inferior septum from mid ventricle to apex,  no ischemia/  mild inferior septal hypokinesis/  ef 58%   CATARACT EXTRACTION W/ INTRAOCULAR LENS  IMPLANT, BILATERAL  2011   COLONOSCOPY  last one 06/ 2018   CYSTOSCOPY WITH BIOPSY N/A 08/12/2015   Procedure: CYSTOSCOPY WITH BIOPSY AND FULGERATION;  Surgeon: Hildred Laser, MD;  Location: [facility];  Service: Urology;  Laterality: N/A;   CYSTOSCOPY WITH INJECTION N/A 06/29/2017   Procedure: CYSTOSCOPY WITH INJECTION OF INDOCYANINE GREEN DYE;  Surgeon: Sebastian Ache, MD;  Location: [facility];  Service: Urology;  Laterality: N/A;   CYSTOSCOPY WITH URETHRAL DILATATION N/A 03/21/2017   Procedure: CYSTOSCOPY;  Surgeon: Hildred Laser,  MD;  Location: [facility];  Service: Urology;  Laterality: N/A;   ESOPHAGOGASTRODUODENOSCOPY  12-05-2014   IR IMAGING GUIDED PORT INSERTION  02/07/2018   IR NEPHROSTOMY PLACEMENT LEFT  12/25/2021   IR REMOVAL TUN ACCESS W/ PORT W/O FL MOD SED  04/14/2018   TRANSTHORACIC ECHOCARDIOGRAM  01-31-2008   nomral LVF,  ef 55-65%/  mild pulmonic stenosis without regurg.,  peak transpulomonic valve gradient   TRANSURETHRAL RESECTION OF BLADDER TUMOR N/A 12/31/2014   Procedure: TRANSURETHRAL RESECTION OF BLADDER TUMOR (TURBT);  Surgeon: Su Grand, MD;  Location: [facility];  Service: Urology;  Laterality: N/A;   TRANSURETHRAL RESECTION OF BLADDER TUMOR N/A 03/21/2017   Procedure: POSSIBLE TRANSURETHRAL RESECTION OF BLADDER TUMOR (TURBT);  Surgeon: Hildred Laser, MD;  Location: [facility];  Service: Urology;  Laterality: N/A;    Social History: Social History   Socioeconomic History   Marital status: Married    Spouse name: Not on file   Number of children: 1   Years of education: Not on file   Highest education level: Not on file  Occupational History   Occupation: Retired  Tobacco Use   Smoking status: Former    Current packs/day: 0.00    Average packs/day: 1 pack/day for 34.0 years (34.0 ttl pk-yrs)    Types: Cigarettes    Start date: 12/27/1954    Quit date: 12/26/1988    Years since quitting: 34.5   Smokeless tobacco: Never  Vaping Use   Vaping status: Never Used  Substance and Sexual Activity   Alcohol use: Yes    Alcohol/week: 6.0 standard drinks of alcohol    Types: 6 Cans of beer per week    Comment: BEER   Drug use: No   Sexual activity: Not Currently  Other Topics Concern   Not on file  Social History Narrative   Not on file   Social Determinants of Health   Financial Resource Strain: Not on file  Food Insecurity: No Food Insecurity (12/20/2022)   Hunger Vital Sign    Worried About Running Out of Food in the Last  Year: Never true    Ran Out of Food in the Last Year: Never true  Transportation Needs: No Transportation Needs (12/20/2022)   PRAPARE - Administrator, Civil Service (Medical): No    Lack of Transportation (Non-Medical): No  Physical Activity: Not on file  Stress: Not on file  Social Connections: Unknown (12/28/2021)   [facility] from [facility]   Social Network    Social Network: Not on file  Intimate Partner Violence: Not At Risk (12/20/2022)   Humiliation, Afraid, Rape, and Kick questionnaire    Fear of Current or Ex-Partner: No    Emotionally Abused: No    Physically Abused: No    Sexually Abused: No    Family History: Family History  Problem Relation Age of Onset   Alcoholism Father    Colon cancer Neg Hx    Colon polyps Neg Hx    Diabetes Neg Hx    Kidney disease Neg Hx    Esophageal cancer Neg Hx    Heart disease Neg Hx    Gallbladder disease Neg Hx    Allergic rhinitis Neg Hx    Angioedema Neg Hx    Asthma Neg Hx    Atopy Neg Hx    Eczema Neg Hx    Immunodeficiency Neg Hx    Urticaria Neg Hx     Current Medications:  Current Outpatient Medications:    acetaminophen (TYLENOL) 500 MG tablet, Take 1,000 mg by mouth every 6 (six) hours as needed for mild pain, fever, moderate pain or headache., Disp: , Rfl:    apixaban (ELIQUIS) 5 MG TABS tablet, Take 1 tablet (5 mg total) by mouth 2 (two) times daily., Disp: 28 tablet, Rfl: 0   betamethasone dipropionate 0.05 % cream, Apply topically 2 (two) times daily., Disp: 60 g, Rfl: 2   budesonide-formoterol (SYMBICORT) 160-4.5 MCG/ACT inhaler, Inhale 2 puffs into the lungs 2 (two) times daily., Disp: 1 each, Rfl: 6   chlorpheniramine-HYDROcodone (TUSSIONEX) 10-8 MG/5ML, Take 5 mLs by mouth every 12 (twelve) hours as needed (severe cough)., Disp: 70 mL, Rfl: 0   dextromethorphan-guaiFENesin (MUCINEX DM) 30-600 MG 12hr tablet, Take 1 tablet by mouth  2 (two) times daily., Disp: , Rfl:    hydrOXYzine  (ATARAX) 25 MG tablet, Take 1 tablet (25 mg total) by mouth 3 (three) times daily as needed., Disp: 90 tablet, Rfl: 1   megestrol (MEGACE) 400 MG/10ML suspension, Take 10 mLs (400 mg total) by mouth 2 (two) times daily., Disp: 480 mL, Rfl: 3   methenamine (HIPREX) 1 g tablet, Take 1 tablet (1 g total) by mouth 2 (two) times daily., Disp: 60 tablet, Rfl: 11   methylphenidate (RITALIN) 10 MG tablet, TAKE 1 TAB DAILY IF NEEDED. MAX 3 TABS/DAY., Disp: 30 tablet, Rfl: 0   metoprolol tartrate (LOPRESSOR) 25 MG tablet, Take 1 tablet (25 mg total) by mouth 2 (two) times daily., Disp: 60 tablet, Rfl: 2   pantoprazole (PROTONIX) 40 MG tablet, Take 1 tablet (40 mg total) by mouth 2 (two) times daily., Disp: 60 tablet, Rfl: 0   predniSONE (DELTASONE) 10 MG tablet, Take 1 tablet (10 mg total) by mouth daily with breakfast., Disp: 30 tablet, Rfl: 4  Current Facility-Administered Medications:    omalizumab Geoffry Paradise) prefilled syringe 300 mg, 300 mg, Subcutaneous, Q14 Days, Alfonse Spruce, MD, 300 mg at 11/24/22 7425   Allergies: Allergies  Allergen Reactions   Ciprofloxacin Hives   Zofran [Ondansetron] Rash    REVIEW OF SYSTEMS:   Review of Systems  Constitutional:  Negative for chills, fatigue and fever.  HENT:   Negative for lump/mass, mouth sores, nosebleeds, sore throat and trouble swallowing.   Eyes:  Negative for eye problems.  Respiratory:  Positive for cough and shortness of breath.   Cardiovascular:  Negative for chest pain, leg swelling and palpitations.  Gastrointestinal:  Negative for abdominal pain, constipation, diarrhea, nausea and vomiting.  Genitourinary:  Negative for bladder incontinence, difficulty urinating, dysuria, frequency, hematuria and nocturia.   Musculoskeletal:  Negative for arthralgias, back pain, flank pain, myalgias and neck pain.  Skin:  Negative for itching and rash.  Neurological:  Negative for dizziness, headaches and numbness.  Hematological:  Does not  bruise/bleed easily.  Psychiatric/Behavioral:  Negative for depression, sleep disturbance and suicidal ideas. The patient is not nervous/anxious.   All other systems reviewed and are negative.    VITALS:   Blood pressure (!) 141/77, pulse 61, temperature (!) 96.2 F (35.7 C), temperature source Oral, resp. rate 16, weight 178 lb 12.8 oz (81.1 kg), SpO2 100%.  Wt Readings from Last 3 Encounters:  06/28/23 178 lb 12.8 oz (81.1 kg)  06/07/23 174 lb 8 oz (79.2 kg)  05/31/23 176 lb (79.8 kg)    Body mass index is 24.59 kg/m.  Performance status (ECOG): 1 - Symptomatic but completely ambulatory  PHYSICAL EXAM:   Physical Exam Vitals and nursing note reviewed. Exam conducted with a chaperone present.  Constitutional:      Appearance: Normal appearance.  Cardiovascular:     Rate and Rhythm: Normal rate and regular rhythm.     Pulses: Normal pulses.     Heart sounds: Normal heart sounds.  Pulmonary:     Effort: Pulmonary effort is normal.     Breath sounds: Normal breath sounds.  Abdominal:     Palpations: Abdomen is soft. There is no hepatomegaly, splenomegaly or mass.     Tenderness: There is no abdominal tenderness.  Musculoskeletal:     Right lower leg: No edema.     Left lower leg: No edema.  Lymphadenopathy:     Cervical: No cervical adenopathy.     Right cervical: No superficial, deep  or posterior cervical adenopathy.    Left cervical: No superficial, deep or posterior cervical adenopathy.     Upper Body:     Right upper body: No supraclavicular or axillary adenopathy.     Left upper body: No supraclavicular or axillary adenopathy.  Neurological:     General: No focal deficit present.     Mental Status: He is alert and oriented to person, place, and time.  Psychiatric:        Mood and Affect: Mood normal.        Behavior: Behavior normal.     LABS:      Latest Ref Rng & Units 06/28/2023   10:44 AM 06/07/2023   12:09 PM 05/18/2023   12:03 PM  CBC  WBC 4.0 -  10.5 K/uL 9.3  11.3  7.8   Hemoglobin 13.0 - 17.0 g/dL 84.1  32.4  40.1   Hematocrit 39.0 - 52.0 % 45.2  43.4  43.5   Platelets 150 - 400 K/uL 206  230  237       Latest Ref Rng & Units 06/28/2023   10:44 AM 06/07/2023   12:09 PM 05/18/2023   12:03 PM  CMP  Glucose 70 - 99 mg/dL 89  77  96   BUN 8 - 23 mg/dL 16  15  14    Creatinine 0.61 - 1.24 mg/dL 0.27  2.53  6.64   Sodium 135 - 145 mmol/L 138  139  142   Potassium 3.5 - 5.1 mmol/L 3.5  3.6  4.1   Chloride 98 - 111 mmol/L 105  108  108   CO2 22 - 32 mmol/L 24  22  29    Calcium 8.9 - 10.3 mg/dL 8.1  8.3  8.5   Total Protein 6.5 - 8.1 g/dL 5.2  4.9  5.6   Total Bilirubin <1.2 mg/dL 0.6  0.5  0.6   Alkaline Phos 38 - 126 U/L 50  60  52   AST 15 - 41 U/L 15  17  12    ALT 0 - 44 U/L 15  17  15       No results found for: "CEA1", "CEA" / No results found for: "CEA1", "CEA" No results found for: "PSA1" No results found for: "QIH474" No results found for: "CAN125"  No results found for: "TOTALPROTELP", "ALBUMINELP", "A1GS", "A2GS", "BETS", "BETA2SER", "GAMS", "MSPIKE", "SPEI" Lab Results  Component Value Date   TIBC 249 03/20/2018   FERRITIN 759 (H) 03/20/2018   IRONPCTSAT 20 (L) 03/20/2018   Lab Results  Component Value Date   LDH 96 (L) 06/21/2022     STUDIES:   NM PET Image Restag (PS) Skull Base To Thigh  Result Date: 06/28/2023 CLINICAL DATA:  Subsequent treatment strategy for tumor type. EXAM: NUCLEAR MEDICINE PET SKULL BASE TO THIGH TECHNIQUE: 8.6 mCi F-18 FDG was injected intravenously. Full-ring PET imaging was performed from the skull base to thigh after the radiotracer. CT data was obtained and used for attenuation correction and anatomic localization. Fasting blood glucose: 90 mg/dl COMPARISON:  25/95/6387 FINDINGS: NECK: No hypermetabolic lymph nodes in the neck. Incidental CT findings: None. CHEST: no hypermetabolic mediastinal lymph nodes. No hypermetabolic pulmonary nodules. No axillary adenopathy. Large  layering RIGHT pleural effusion is unchanged. Atelectasis in the medial RIGHT upper lobe is unchanged. Small LEFT effusion is unchanged. Incidental CT findings: None. ABDOMEN/PELVIS: Post cystectomy and ileal conduit anatomy. Small cyst in the RIGHT hepatic lobe. No evidence of liver metastasis. No hypermetabolic lymph nodes in  the abdomen pelvis. Incidental CT findings: None. SKELETON: Destructive lesion in the inferior RIGHT pubic ramus unchanged and without metabolic activity. No evidence of active skeletal metastasis. Incidental CT findings: None. IMPRESSION: 1. No evidence of recurrent neoplasm in the neck, chest, abdomen, or pelvis. 2. No evidence of active skeletal metastasis. 3. Stable large RIGHT pleural effusion and small LEFT effusion. 4. Non metabolic erosive and expansile in the inferior RIGHT pubic ramus unchanged from prior. Finding consistent with treated metastasis. Electronically Signed   By: Genevive Bi M.D.   On: 06/28/2023 11:09

## 2023-06-29 LAB — T4: T4, Total: 6.1 ug/dL (ref 4.5–12.0)

## 2023-07-06 ENCOUNTER — Encounter: Payer: Self-pay | Admitting: Hematology

## 2023-07-06 ENCOUNTER — Other Ambulatory Visit: Payer: Self-pay | Admitting: Hematology

## 2023-07-06 DIAGNOSIS — J90 Pleural effusion, not elsewhere classified: Secondary | ICD-10-CM

## 2023-07-08 ENCOUNTER — Ambulatory Visit (HOSPITAL_COMMUNITY)
Admission: RE | Admit: 2023-07-08 | Discharge: 2023-07-08 | Disposition: A | Discharge status: Home / Self Care | Payer: Medicare Other | Source: Ambulatory Visit | Attending: Hematology | Admitting: Hematology

## 2023-07-08 ENCOUNTER — Encounter: Payer: Self-pay | Admitting: Hematology

## 2023-07-08 ENCOUNTER — Encounter (HOSPITAL_COMMUNITY): Payer: Self-pay

## 2023-07-08 ENCOUNTER — Encounter: Payer: Self-pay | Admitting: Oncology

## 2023-07-08 ENCOUNTER — Ambulatory Visit (HOSPITAL_COMMUNITY)
Admission: RE | Admit: 2023-07-08 | Discharge: 2023-07-08 | Disposition: A | Discharge status: Home / Self Care | Payer: Medicare Other | Source: Ambulatory Visit | Attending: Interventional Radiology | Admitting: Interventional Radiology

## 2023-07-08 ENCOUNTER — Other Ambulatory Visit: Payer: Self-pay

## 2023-07-08 DIAGNOSIS — C679 Malignant neoplasm of bladder, unspecified: Secondary | ICD-10-CM | POA: Insufficient documentation

## 2023-07-08 DIAGNOSIS — J90 Pleural effusion, not elsewhere classified: Secondary | ICD-10-CM

## 2023-07-08 DIAGNOSIS — Z48813 Encounter for surgical aftercare following surgery on the respiratory system: Secondary | ICD-10-CM | POA: Diagnosis not present

## 2023-07-08 DIAGNOSIS — J910 Malignant pleural effusion: Secondary | ICD-10-CM | POA: Insufficient documentation

## 2023-07-08 DIAGNOSIS — C801 Malignant (primary) neoplasm, unspecified: Secondary | ICD-10-CM | POA: Diagnosis not present

## 2023-07-08 MED ORDER — LIDOCAINE HCL (PF) 2 % IJ SOLN
10.0000 mL | Freq: Once | INTRAMUSCULAR | Status: AC
  Administered 2023-07-08: 10 mL

## 2023-07-08 MED ORDER — LIDOCAINE HCL (PF) 2 % IJ SOLN
INTRAMUSCULAR | Status: AC
  Filled 2023-07-08: qty 10

## 2023-07-08 MED ORDER — APIXABAN 5 MG PO TABS: 5.0000 mg | ORAL_TABLET | Freq: Two times a day (BID) | ORAL | 0 refills | Status: DC

## 2023-07-08 NOTE — Progress Notes (Signed)
Patient tolerated right sided thoracentesis procedure well today and 2 Liters of clear yellow pleural fluid removed. Patient had post chest xray read by radiologist then left with wife via wheelchair with no acute distress noted. Patient verbalized understanding of discharge instructions.

## 2023-07-18 ENCOUNTER — Other Ambulatory Visit: Payer: Self-pay | Admitting: Hematology

## 2023-07-19 ENCOUNTER — Inpatient Hospital Stay: Payer: Medicare Other

## 2023-07-19 ENCOUNTER — Inpatient Hospital Stay: Payer: Medicare Other | Attending: Oncology

## 2023-07-19 VITALS — BP 99/63 | HR 63 | Temp 98.0°F | Resp 18 | Wt 172.4 lb

## 2023-07-19 DIAGNOSIS — Z5112 Encounter for antineoplastic immunotherapy: Secondary | ICD-10-CM | POA: Insufficient documentation

## 2023-07-19 DIAGNOSIS — C679 Malignant neoplasm of bladder, unspecified: Secondary | ICD-10-CM | POA: Diagnosis not present

## 2023-07-19 DIAGNOSIS — C7951 Secondary malignant neoplasm of bone: Secondary | ICD-10-CM | POA: Insufficient documentation

## 2023-07-19 DIAGNOSIS — C7801 Secondary malignant neoplasm of right lung: Secondary | ICD-10-CM | POA: Diagnosis not present

## 2023-07-19 DIAGNOSIS — Z79620 Long term (current) use of immunosuppressive biologic: Secondary | ICD-10-CM | POA: Diagnosis not present

## 2023-07-19 DIAGNOSIS — C7802 Secondary malignant neoplasm of left lung: Secondary | ICD-10-CM | POA: Insufficient documentation

## 2023-07-19 LAB — CBC WITH DIFFERENTIAL/PLATELET
Abs Immature Granulocytes: 0.07 10*3/uL (ref 0.00–0.07)
Basophils Absolute: 0.1 10*3/uL (ref 0.0–0.1)
Basophils Relative: 1 %
Eosinophils Absolute: 0.2 10*3/uL (ref 0.0–0.5)
Eosinophils Relative: 2 %
HCT: 44.4 % (ref 39.0–52.0)
Hemoglobin: 14.3 g/dL (ref 13.0–17.0)
Immature Granulocytes: 1 %
Lymphocytes Relative: 11 %
Lymphs Abs: 1.1 10*3/uL (ref 0.7–4.0)
MCH: 35.3 pg — ABNORMAL HIGH (ref 26.0–34.0)
MCHC: 32.2 g/dL (ref 30.0–36.0)
MCV: 109.6 fL — ABNORMAL HIGH (ref 80.0–100.0)
Monocytes Absolute: 0.8 10*3/uL (ref 0.1–1.0)
Monocytes Relative: 8 %
Neutro Abs: 7.6 10*3/uL (ref 1.7–7.7)
Neutrophils Relative %: 77 %
Platelets: 248 10*3/uL (ref 150–400)
RBC: 4.05 MIL/uL — ABNORMAL LOW (ref 4.22–5.81)
RDW: 13.6 % (ref 11.5–15.5)
WBC: 9.9 10*3/uL (ref 4.0–10.5)
nRBC: 0 % (ref 0.0–0.2)

## 2023-07-19 LAB — COMPREHENSIVE METABOLIC PANEL
ALT: 16 U/L (ref 0–44)
AST: 14 U/L — ABNORMAL LOW (ref 15–41)
Albumin: 2.6 g/dL — ABNORMAL LOW (ref 3.5–5.0)
Alkaline Phosphatase: 45 U/L (ref 38–126)
Anion gap: 9 (ref 5–15)
BUN: 18 mg/dL (ref 8–23)
CO2: 23 mmol/L (ref 22–32)
Calcium: 8.8 mg/dL — ABNORMAL LOW (ref 8.9–10.3)
Chloride: 112 mmol/L — ABNORMAL HIGH (ref 98–111)
Creatinine, Ser: 1.19 mg/dL (ref 0.61–1.24)
GFR, Estimated: >60 mL/min (ref >60)
Glucose, Bld: 90 mg/dL (ref 70–99)
Potassium: 3.5 mmol/L (ref 3.5–5.1)
Sodium: 144 mmol/L (ref 135–145)
Total Bilirubin: 0.5 mg/dL (ref <1.2)
Total Protein: 5.2 g/dL — ABNORMAL LOW (ref 6.5–8.1)

## 2023-07-19 LAB — MAGNESIUM: Magnesium: 2.3 mg/dL (ref 1.7–2.4)

## 2023-07-19 MED ORDER — PEMBROLIZUMAB CHEMO INJECTION 100 MG/4ML
200.0000 mg | Freq: Once | INTRAVENOUS | Status: AC
  Administered 2023-07-19: 200 mg via INTRAVENOUS
  Filled 2023-07-19: qty 8

## 2023-07-19 MED ORDER — SODIUM CHLORIDE 0.9 % IV SOLN
Freq: Once | INTRAVENOUS | Status: AC
Start: 2023-07-19 — End: 2023-07-19

## 2023-07-19 NOTE — Progress Notes (Signed)
 Patient tolerated chemotherapy with no complaints voiced.  Side effects with management reviewed with understanding verbalized. IV site clean and dry with no bruising or swelling noted at site.  Good blood return noted before and after administration of chemotherapy.  Band aid applied.  Patient left in satisfactory condition with VSS and no s/s of distress noted. All follow ups as scheduled.   [name] [employer]

## 2023-07-19 NOTE — Patient Instructions (Signed)
[facility] [place] - A DEPT OF [facility]  Discharge Instructions: Thank you for choosing [facility] to provide your oncology and hematology care.  If you have a lab appointment with the Cancer Center - please note that after April 8th, 2024, all labs will be drawn in the cancer center.  You do not have to check in or register with the main entrance as you have in the past but will complete your check-in in the cancer center.  Wear comfortable clothing and clothing appropriate for easy access to any Portacath or PICC line.   We strive to give you quality time with your provider. You may need to reschedule your appointment if you arrive late (15 or more minutes).  Arriving late affects you and other patients whose appointments are after yours.  Also, if you miss three or more appointments without notifying the office, you may be dismissed from the clinic at the provider's discretion.      For prescription refill requests, have your pharmacy contact our office and allow 72 hours for refills to be completed.    Today you received the following chemotherapy and/or immunotherapy agents [name]   To help prevent nausea and vomiting after your treatment, we encourage you to take your nausea medication as directed.  BELOW ARE SYMPTOMS THAT SHOULD BE REPORTED IMMEDIATELY: *FEVER GREATER THAN 100.4 F (38 C) OR HIGHER *CHILLS OR SWEATING *NAUSEA AND VOMITING THAT IS NOT CONTROLLED WITH YOUR NAUSEA MEDICATION *UNUSUAL SHORTNESS OF BREATH *UNUSUAL BRUISING OR BLEEDING *URINARY PROBLEMS (pain or burning when urinating, or frequent urination) *BOWEL PROBLEMS (unusual diarrhea, constipation, pain near the anus) TENDERNESS IN MOUTH AND THROAT WITH OR WITHOUT PRESENCE OF ULCERS (sore throat, sores in mouth, or a toothache) UNUSUAL RASH, SWELLING OR PAIN  UNUSUAL VAGINAL DISCHARGE OR ITCHING   Items with * indicate a potential emergency and should be followed up as  soon as possible or go to the Emergency Department if any problems should occur.  Please show the CHEMOTHERAPY ALERT CARD or IMMUNOTHERAPY ALERT CARD at check-in to the Emergency Department and triage nurse.  Should you have questions after your visit or need to cancel or reschedule your appointment, please contact [facility] [place] [facility] - A DEPT OF Eligha Bridegroom [facility] [phone]  and follow the prompts.  Office hours are 8:00 a.m. to 4:30 p.m. Monday - Friday. Please note that voicemails left after 4:00 p.m. may not be returned until the following business day.  We are closed weekends and major holidays. You have access to a nurse at all times for urgent questions. Please call the main number to the clinic [phone] and follow the prompts.  For any non-urgent questions, you may also contact your provider using MyChart. We now offer e-Visits for anyone 28 and older to request care online for non-urgent symptoms. For details visit [email or web address].   Also download the MyChart app! Go to the app store, search "MyChart", open the app, select [place], and log in with your MyChart username and password.

## 2023-07-20 ENCOUNTER — Other Ambulatory Visit (HOSPITAL_COMMUNITY): Payer: Medicare Other

## 2023-07-20 ENCOUNTER — Encounter: Payer: Self-pay | Admitting: Hematology

## 2023-07-20 ENCOUNTER — Other Ambulatory Visit: Payer: Self-pay | Admitting: Hematology

## 2023-07-20 ENCOUNTER — Encounter (HOSPITAL_COMMUNITY): Payer: Self-pay

## 2023-07-20 ENCOUNTER — Ambulatory Visit (HOSPITAL_COMMUNITY)
Admission: RE | Admit: 2023-07-20 | Discharge: 2023-07-20 | Disposition: A | Discharge status: Home / Self Care | Payer: Medicare Other | Source: Ambulatory Visit | Attending: Physician Assistant | Admitting: Physician Assistant

## 2023-07-20 ENCOUNTER — Ambulatory Visit (HOSPITAL_COMMUNITY)
Admission: RE | Admit: 2023-07-20 | Discharge: 2023-07-20 | Disposition: A | Discharge status: Home / Self Care | Payer: Medicare Other | Source: Ambulatory Visit | Attending: Hematology | Admitting: Hematology

## 2023-07-20 ENCOUNTER — Other Ambulatory Visit: Payer: Self-pay | Admitting: *Deleted

## 2023-07-20 ENCOUNTER — Ambulatory Visit (HOSPITAL_COMMUNITY): Admission: RE | Admit: 2023-07-20 | Payer: Medicare Other | Source: Ambulatory Visit

## 2023-07-20 DIAGNOSIS — J910 Malignant pleural effusion: Secondary | ICD-10-CM | POA: Diagnosis not present

## 2023-07-20 DIAGNOSIS — Z48813 Encounter for surgical aftercare following surgery on the respiratory system: Secondary | ICD-10-CM | POA: Diagnosis not present

## 2023-07-20 DIAGNOSIS — J90 Pleural effusion, not elsewhere classified: Secondary | ICD-10-CM | POA: Diagnosis not present

## 2023-07-20 DIAGNOSIS — C679 Malignant neoplasm of bladder, unspecified: Secondary | ICD-10-CM | POA: Diagnosis not present

## 2023-07-20 MED ORDER — LIDOCAINE HCL (PF) 2 % IJ SOLN
10.0000 mL | Freq: Once | INTRAMUSCULAR | Status: AC
  Administered 2023-07-20: 10 mL

## 2023-07-20 NOTE — Procedures (Signed)
PROCEDURE SUMMARY:  Successful image-guided right thoracentesis. Yielded 1.1 liters of clear yellow fluid, patient requested to stop at this amount due to discomfort after removal of larger volumes previously. Significant residual pleural fluid remains on post procedure [place]. Patient tolerated procedure well. EBL < 1 mL No immediate complications.  Specimen was not sent for labs. Post procedure CXR shows no pneumothorax.  Please see imaging section of Epic for full dictation.  Villa Herb PA-C 07/20/2023 2:49 PM

## 2023-07-20 NOTE — Progress Notes (Signed)
Patient tolerated right sided thoracentesis procedure well today and 1.1 Liters of clear yellow pleural fluid removed. Patient verbalized understanding of discharge instructions, had post chest xray read by radiologist and ambulatory at departure with no acute distress noted.

## 2023-07-21 ENCOUNTER — Inpatient Hospital Stay: Payer: Medicare Other

## 2023-07-21 ENCOUNTER — Encounter: Payer: Self-pay | Admitting: Hematology

## 2023-07-21 ENCOUNTER — Other Ambulatory Visit: Payer: Self-pay | Admitting: Hematology

## 2023-07-21 ENCOUNTER — Inpatient Hospital Stay: Payer: Medicare Other | Admitting: Hematology

## 2023-07-21 DIAGNOSIS — J90 Pleural effusion, not elsewhere classified: Secondary | ICD-10-CM

## 2023-08-03 ENCOUNTER — Ambulatory Visit (HOSPITAL_COMMUNITY)
Admission: RE | Admit: 2023-08-03 | Discharge: 2023-08-03 | Disposition: A | Discharge status: Home / Self Care | Payer: Medicare Other | Source: Ambulatory Visit | Attending: Diagnostic Radiology | Admitting: Diagnostic Radiology

## 2023-08-03 ENCOUNTER — Ambulatory Visit (HOSPITAL_COMMUNITY)
Admission: RE | Admit: 2023-08-03 | Discharge: 2023-08-03 | Disposition: A | Discharge status: Home / Self Care | Payer: Medicare Other | Source: Ambulatory Visit | Attending: Hematology | Admitting: Hematology

## 2023-08-03 ENCOUNTER — Encounter (HOSPITAL_COMMUNITY): Payer: Self-pay

## 2023-08-03 DIAGNOSIS — J984 Other disorders of lung: Secondary | ICD-10-CM | POA: Diagnosis not present

## 2023-08-03 DIAGNOSIS — J90 Pleural effusion, not elsewhere classified: Secondary | ICD-10-CM | POA: Insufficient documentation

## 2023-08-03 NOTE — Procedures (Signed)
INDICATION: Symptomatic right pleural effusion EXAM: ULTRASOUND GUIDED RIGHT THORACENTESIS GRIP-IR: Category: Fluids  Subcategory: Thoracentesis  Follow-Up: None  MEDICATIONS: None. COMPLICATIONS: None immediate. PROCEDURE: An ultrasound guided thoracentesis was thoroughly discussed with the patient and questions answered.  The benefits, risks (including pneumothorax, hemorrhage, and infection), and alternatives to the procedure were discussed.  The patient understands and wishes to proceed with the procedure.  Written consent was obtained.  Ultrasound was performed to localize and mark an adequate pocket of fluid in the right chest.  The area was then prepped and draped in the normal sterile fashion. 1% Lidocaine was used for local anesthesia.  Under ultrasound guidance a 6 Fr Safe-T-Centesis style catheter was introduced. Thoracentesis was performed. The catheter was removed and a dressing applied.  FINDINGS: A total of approximately 2.1 of straw-colored fluid was removed.  IMPRESSION: Successful ultrasound guided right thoracentesis yielding 2.1 L of pleural fluid.

## 2023-08-03 NOTE — Progress Notes (Signed)
Patient tolerated right sided Thoracentesis procedure well today and 2.1 Liters of clear yellow pleural fluid removed. Patient verbalized understanding of discharge instructions and ambulatory to xray department at this time for post chest xray with no acute distress noted.

## 2023-08-11 ENCOUNTER — Inpatient Hospital Stay: Payer: Medicare Other | Admitting: Hematology

## 2023-08-11 ENCOUNTER — Inpatient Hospital Stay: Payer: Medicare Other

## 2023-08-11 VITALS — BP 100/62 | HR 77 | Temp 98.3°F | Resp 18 | Wt 169.9 lb

## 2023-08-11 DIAGNOSIS — C7801 Secondary malignant neoplasm of right lung: Secondary | ICD-10-CM | POA: Diagnosis not present

## 2023-08-11 DIAGNOSIS — Z79620 Long term (current) use of immunosuppressive biologic: Secondary | ICD-10-CM | POA: Diagnosis not present

## 2023-08-11 DIAGNOSIS — C679 Malignant neoplasm of bladder, unspecified: Secondary | ICD-10-CM

## 2023-08-11 DIAGNOSIS — C7802 Secondary malignant neoplasm of left lung: Secondary | ICD-10-CM | POA: Diagnosis not present

## 2023-08-11 DIAGNOSIS — C7951 Secondary malignant neoplasm of bone: Secondary | ICD-10-CM | POA: Diagnosis not present

## 2023-08-11 DIAGNOSIS — Z5112 Encounter for antineoplastic immunotherapy: Secondary | ICD-10-CM | POA: Diagnosis not present

## 2023-08-11 LAB — CBC WITH DIFFERENTIAL/PLATELET
Abs Immature Granulocytes: 0.14 10*3/uL — ABNORMAL HIGH (ref 0.00–0.07)
Basophils Absolute: 0.1 10*3/uL (ref 0.0–0.1)
Basophils Relative: 1 %
Eosinophils Absolute: 0.2 10*3/uL (ref 0.0–0.5)
Eosinophils Relative: 1 %
HCT: 48.8 % (ref 39.0–52.0)
Hemoglobin: 15.4 g/dL (ref 13.0–17.0)
Immature Granulocytes: 1 %
Lymphocytes Relative: 7 %
Lymphs Abs: 1.3 10*3/uL (ref 0.7–4.0)
MCH: 35.8 pg — ABNORMAL HIGH (ref 26.0–34.0)
MCHC: 31.6 g/dL (ref 30.0–36.0)
MCV: 113.5 fL — ABNORMAL HIGH (ref 80.0–100.0)
Monocytes Absolute: 1.4 10*3/uL — ABNORMAL HIGH (ref 0.1–1.0)
Monocytes Relative: 7 %
Neutro Abs: 15.3 10*3/uL — ABNORMAL HIGH (ref 1.7–7.7)
Neutrophils Relative %: 83 %
Platelets: 186 10*3/uL (ref 150–400)
RBC: 4.3 MIL/uL (ref 4.22–5.81)
RDW: 14.4 % (ref 11.5–15.5)
WBC: 18.3 10*3/uL — ABNORMAL HIGH (ref 4.0–10.5)
nRBC: 0 % (ref 0.0–0.2)

## 2023-08-11 LAB — COMPREHENSIVE METABOLIC PANEL
ALT: 9 U/L (ref 0–44)
AST: 17 U/L (ref 15–41)
Albumin: 2.7 g/dL — ABNORMAL LOW (ref 3.5–5.0)
Alkaline Phosphatase: 49 U/L (ref 38–126)
Anion gap: 8 (ref 5–15)
BUN: 20 mg/dL (ref 8–23)
CO2: 19 mmol/L — ABNORMAL LOW (ref 22–32)
Calcium: 8.5 mg/dL — ABNORMAL LOW (ref 8.9–10.3)
Chloride: 112 mmol/L — ABNORMAL HIGH (ref 98–111)
Creatinine, Ser: 1.18 mg/dL (ref 0.61–1.24)
GFR, Estimated: >60 mL/min (ref >60)
Glucose, Bld: 105 mg/dL — ABNORMAL HIGH (ref 70–99)
Potassium: 4 mmol/L (ref 3.5–5.1)
Sodium: 139 mmol/L (ref 135–145)
Total Bilirubin: 1 mg/dL (ref <1.2)
Total Protein: 5.7 g/dL — ABNORMAL LOW (ref 6.5–8.1)

## 2023-08-11 LAB — MAGNESIUM: Magnesium: 2.5 mg/dL — ABNORMAL HIGH (ref 1.7–2.4)

## 2023-08-11 MED ORDER — PEMBROLIZUMAB CHEMO INJECTION 100 MG/4ML
200.0000 mg | Freq: Once | INTRAVENOUS | Status: AC
  Administered 2023-08-11: 200 mg via INTRAVENOUS
  Filled 2023-08-11: qty 200

## 2023-08-11 MED ORDER — SODIUM CHLORIDE 0.9 % IV SOLN: Freq: Once | INTRAVENOUS | Status: AC

## 2023-08-11 NOTE — Patient Instructions (Signed)
[facility] [place] - [facility]  Discharge Instructions: Thank you for choosing [facility] to provide your oncology and hematology care.  If you have a lab appointment with the Cancer Center - please note that after April 8th, 2024, all labs will be drawn in the cancer center.  You do not have to check in or register with the main entrance as you have in the past but will complete your check-in in the cancer center.  Wear comfortable clothing and clothing appropriate for easy access to any Portacath or PICC line.   We strive to give you quality time with your provider. You may need to reschedule your appointment if you arrive late (15 or more minutes).  Arriving late affects you and other patients whose appointments are after yours.  Also, if you miss three or more appointments without notifying the office, you may be dismissed from the clinic at the provider's discretion.      For prescription refill requests, have your pharmacy contact our office and allow 72 hours for refills to be completed.    Today you received the following chemotherapy and/or immunotherapy agents Keytruda, return as scheduled.   To help prevent nausea and vomiting after your treatment, we encourage you to take your nausea medication as directed.  BELOW ARE SYMPTOMS THAT SHOULD BE REPORTED IMMEDIATELY: *FEVER GREATER THAN 100.4 F (38 C) OR HIGHER *CHILLS OR SWEATING *NAUSEA AND VOMITING THAT IS NOT CONTROLLED WITH YOUR NAUSEA MEDICATION *UNUSUAL SHORTNESS OF BREATH *UNUSUAL BRUISING OR BLEEDING *URINARY PROBLEMS (pain or burning when urinating, or frequent urination) *BOWEL PROBLEMS (unusual diarrhea, constipation, pain near the anus) TENDERNESS IN MOUTH AND THROAT WITH OR WITHOUT PRESENCE OF ULCERS (sore throat, sores in mouth, or a toothache) UNUSUAL RASH, SWELLING OR PAIN  UNUSUAL VAGINAL DISCHARGE OR ITCHING   Items with * indicate a potential emergency and  should be followed up as soon as possible or go to the Emergency Department if any problems should occur.  Please show the CHEMOTHERAPY ALERT CARD or IMMUNOTHERAPY ALERT CARD at check-in to the Emergency Department and triage nurse.  Should you have questions after your visit or need to cancel or reschedule your appointment, please contact [facility] [place] - A DEPT OF [facility]igh[facility] Bridegroom [facility] [phone]  and follow the prompts.  Office hours are 8:00 a.m. to 4:30 p.m. Monday - Friday. Please note that voicemails left after 4:00 p.m. may not be returned until the following business day.  We are closed weekends and major holidays. You have access to a nurse at all times for urgent questions. Please call the main number to the clinic [phone] and follow the prompts.  For any non-urgent questions, you may also contact your provider using MyChart. We now offer e-Visits for anyone 58 and older to request care online for non-urgent symptoms. For details visit [email or web address].   Also download the MyChart app! Go to the app store, search "MyChart", open the app, select [place], and log in with your MyChart username and password.

## 2023-08-11 NOTE — Progress Notes (Signed)
Patient presents today for [facility], Patient reports SOB when walking around and productive cough with thick white film, WBC 18.2 Dr. Ellin Saba made aware of symptoms, okay to treat per Dr. Ellin Saba. Patient tolerated chemotherapy with no complaints voiced. Side effects with management reviewed understanding verbalized. IV site clean and dry with no bruising or swelling noted at site. Good blood return noted before and after administration of chemotherapy. Band aid applied. Patient left in satisfactory condition with VSS and no s/s of distress noted.

## 2023-08-18 ENCOUNTER — Ambulatory Visit (HOSPITAL_COMMUNITY): Admission: RE | Admit: 2023-08-18 | Payer: Medicare Other | Source: Ambulatory Visit

## 2023-08-19 ENCOUNTER — Ambulatory Visit (HOSPITAL_COMMUNITY)
Admission: RE | Admit: 2023-08-19 | Discharge: 2023-08-19 | Disposition: A | Discharge status: Home / Self Care | Payer: Medicare Other | Source: Ambulatory Visit | Attending: Hematology | Admitting: Hematology

## 2023-08-19 ENCOUNTER — Ambulatory Visit (HOSPITAL_COMMUNITY)
Admission: RE | Admit: 2023-08-19 | Discharge: 2023-08-19 | Disposition: A | Discharge status: Home / Self Care | Payer: Medicare Other | Source: Ambulatory Visit | Attending: Physician Assistant | Admitting: Physician Assistant

## 2023-08-19 DIAGNOSIS — J90 Pleural effusion, not elsewhere classified: Secondary | ICD-10-CM | POA: Diagnosis not present

## 2023-08-19 DIAGNOSIS — Z48813 Encounter for surgical aftercare following surgery on the respiratory system: Secondary | ICD-10-CM | POA: Diagnosis not present

## 2023-08-19 NOTE — Procedures (Signed)
 PROCEDURE SUMMARY:  Successful image-guided right thoracentesis. Yielded 1.2 liters of clear yellow fluid. Patient requested to stop at this amount due to chest pain with previous procedures. Significant residual fluid remains on post procedure US . Patient tolerated procedure well. EBL < 1 mL No immediate complications.  Specimen was not sent for labs. Post procedure CXR shows no pneumothorax.  Please see imaging section of Epic for full dictation.  Clotilda DELENA Hesselbach PA-C 08/19/2023 10:10 AM

## 2023-08-19 NOTE — Progress Notes (Signed)
 Pt brought to US  Rm2 in no obvious distresss. Procedure explained, consent obtained. Prepped and draped in sterile fashion, local anesthetic admin without complication. Access obtained at R post thorax. 1.2L total clear amber serous fluid retrieved under vacutainer suction. Access removed without complication, no bleeding, no hematoma. Bandage place. Was taken to xray for CXR.

## 2023-08-24 DIAGNOSIS — C7800 Secondary malignant neoplasm of unspecified lung: Secondary | ICD-10-CM | POA: Diagnosis not present

## 2023-08-24 DIAGNOSIS — Z1331 Encounter for screening for depression: Secondary | ICD-10-CM | POA: Diagnosis not present

## 2023-08-24 DIAGNOSIS — J449 Chronic obstructive pulmonary disease, unspecified: Secondary | ICD-10-CM | POA: Diagnosis not present

## 2023-08-24 DIAGNOSIS — Z936 Other artificial openings of urinary tract status: Secondary | ICD-10-CM | POA: Diagnosis not present

## 2023-08-24 DIAGNOSIS — C679 Malignant neoplasm of bladder, unspecified: Secondary | ICD-10-CM | POA: Diagnosis not present

## 2023-08-24 DIAGNOSIS — I700 Atherosclerosis of aorta: Secondary | ICD-10-CM | POA: Diagnosis not present

## 2023-08-24 DIAGNOSIS — Z6823 Body mass index (BMI) 23.0-23.9, adult: Secondary | ICD-10-CM | POA: Diagnosis not present

## 2023-08-24 DIAGNOSIS — C7951 Secondary malignant neoplasm of bone: Secondary | ICD-10-CM | POA: Diagnosis not present

## 2023-08-24 DIAGNOSIS — L8991 Pressure ulcer of unspecified site, stage 1: Secondary | ICD-10-CM | POA: Diagnosis not present

## 2023-08-24 DIAGNOSIS — C771 Secondary and unspecified malignant neoplasm of intrathoracic lymph nodes: Secondary | ICD-10-CM | POA: Diagnosis not present

## 2023-08-24 DIAGNOSIS — E559 Vitamin D deficiency, unspecified: Secondary | ICD-10-CM | POA: Diagnosis not present

## 2023-08-24 DIAGNOSIS — Z0001 Encounter for general adult medical examination with abnormal findings: Secondary | ICD-10-CM | POA: Diagnosis not present

## 2023-08-24 DIAGNOSIS — I10 Essential (primary) hypertension: Secondary | ICD-10-CM | POA: Diagnosis not present

## 2023-08-24 DIAGNOSIS — Z125 Encounter for screening for malignant neoplasm of prostate: Secondary | ICD-10-CM | POA: Diagnosis not present

## 2023-08-24 DIAGNOSIS — E782 Mixed hyperlipidemia: Secondary | ICD-10-CM | POA: Diagnosis not present

## 2023-08-26 ENCOUNTER — Other Ambulatory Visit: Payer: Self-pay

## 2023-08-31 ENCOUNTER — Other Ambulatory Visit: Payer: Self-pay | Admitting: Hematology

## 2023-09-01 ENCOUNTER — Inpatient Hospital Stay: Payer: Medicare Other | Admitting: Dietician

## 2023-09-01 ENCOUNTER — Inpatient Hospital Stay: Payer: Medicare Other | Admitting: Hematology

## 2023-09-01 ENCOUNTER — Inpatient Hospital Stay: Payer: Medicare Other | Attending: Oncology

## 2023-09-01 ENCOUNTER — Inpatient Hospital Stay: Payer: Medicare Other

## 2023-09-01 VITALS — BP 104/71 | HR 73 | Resp 18

## 2023-09-01 VITALS — BP 108/73 | HR 93 | Temp 98.3°F | Resp 18 | Wt 168.4 lb

## 2023-09-01 DIAGNOSIS — C679 Malignant neoplasm of bladder, unspecified: Secondary | ICD-10-CM

## 2023-09-01 DIAGNOSIS — J90 Pleural effusion, not elsewhere classified: Secondary | ICD-10-CM | POA: Diagnosis not present

## 2023-09-01 DIAGNOSIS — C7801 Secondary malignant neoplasm of right lung: Secondary | ICD-10-CM | POA: Insufficient documentation

## 2023-09-01 DIAGNOSIS — Z5112 Encounter for antineoplastic immunotherapy: Secondary | ICD-10-CM | POA: Insufficient documentation

## 2023-09-01 DIAGNOSIS — C7951 Secondary malignant neoplasm of bone: Secondary | ICD-10-CM | POA: Insufficient documentation

## 2023-09-01 DIAGNOSIS — Z79620 Long term (current) use of immunosuppressive biologic: Secondary | ICD-10-CM | POA: Diagnosis not present

## 2023-09-01 DIAGNOSIS — Z87891 Personal history of nicotine dependence: Secondary | ICD-10-CM | POA: Insufficient documentation

## 2023-09-01 LAB — CBC WITH DIFFERENTIAL/PLATELET
Abs Immature Granulocytes: 0.03 10*3/uL (ref 0.00–0.07)
Basophils Absolute: 0.1 10*3/uL (ref 0.0–0.1)
Basophils Relative: 1 %
Eosinophils Absolute: 0.3 10*3/uL (ref 0.0–0.5)
Eosinophils Relative: 3 %
HCT: 47.1 % (ref 39.0–52.0)
Hemoglobin: 15.6 g/dL (ref 13.0–17.0)
Immature Granulocytes: 0 %
Lymphocytes Relative: 8 %
Lymphs Abs: 0.7 10*3/uL (ref 0.7–4.0)
MCH: 36.6 pg — ABNORMAL HIGH (ref 26.0–34.0)
MCHC: 33.1 g/dL (ref 30.0–36.0)
MCV: 110.6 fL — ABNORMAL HIGH (ref 80.0–100.0)
Monocytes Absolute: 0.8 10*3/uL (ref 0.1–1.0)
Monocytes Relative: 9 %
Neutro Abs: 7.6 10*3/uL (ref 1.7–7.7)
Neutrophils Relative %: 79 %
Platelets: 176 10*3/uL (ref 150–400)
RBC: 4.26 MIL/uL (ref 4.22–5.81)
RDW: 14.3 % (ref 11.5–15.5)
Smear Review: ADEQUATE
WBC: 9.6 10*3/uL (ref 4.0–10.5)
nRBC: 0 % (ref 0.0–0.2)

## 2023-09-01 LAB — COMPREHENSIVE METABOLIC PANEL
ALT: 14 U/L (ref 0–44)
AST: 13 U/L — ABNORMAL LOW (ref 15–41)
Albumin: 2.8 g/dL — ABNORMAL LOW (ref 3.5–5.0)
Alkaline Phosphatase: 57 U/L (ref 38–126)
Anion gap: 8 (ref 5–15)
BUN: 20 mg/dL (ref 8–23)
CO2: 22 mmol/L (ref 22–32)
Calcium: 8.7 mg/dL — ABNORMAL LOW (ref 8.9–10.3)
Chloride: 109 mmol/L (ref 98–111)
Creatinine, Ser: 1.04 mg/dL (ref 0.61–1.24)
GFR, Estimated: >60 mL/min (ref >60)
Glucose, Bld: 116 mg/dL — ABNORMAL HIGH (ref 70–99)
Potassium: 3.7 mmol/L (ref 3.5–5.1)
Sodium: 139 mmol/L (ref 135–145)
Total Bilirubin: 0.9 mg/dL (ref 0.0–1.2)
Total Protein: 5.5 g/dL — ABNORMAL LOW (ref 6.5–8.1)

## 2023-09-01 LAB — MAGNESIUM: Magnesium: 2.2 mg/dL (ref 1.7–2.4)

## 2023-09-01 LAB — TSH: TSH: 3.157 u[IU]/mL (ref 0.350–4.500)

## 2023-09-01 MED ORDER — HEPARIN SOD (PORK) LOCK FLUSH 100 UNIT/ML IV SOLN
500.0000 [IU] | Freq: Once | INTRAVENOUS | Status: AC | PRN
  Administered 2023-09-01: 500 [IU]

## 2023-09-01 MED ORDER — SODIUM CHLORIDE 0.9% FLUSH
10.0000 mL | INTRAVENOUS | Status: DC | PRN
  Administered 2023-09-01: 10 mL

## 2023-09-01 MED ORDER — SODIUM CHLORIDE 0.9 % IV SOLN
200.0000 mg | Freq: Once | INTRAVENOUS | Status: AC
  Administered 2023-09-01: 200 mg via INTRAVENOUS
  Filled 2023-09-01: qty 8

## 2023-09-01 MED ORDER — SODIUM CHLORIDE 0.9 % IV SOLN: Freq: Once | INTRAVENOUS | Status: AC

## 2023-09-01 NOTE — Progress Notes (Signed)
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 He is receiving [place] q21d.   Therapeutic thoracentesis: 1/3 - yield 1.2 L 12/18 - yield 2.1 L 12/4 - yield 1.1 L  Met with pt and wife in infusion. Wife reports patient has a poor appetite. Patient previously taking megace for appetite, but stopped due to bad taste. He will restart this today per MD. Patient is drinking one Boost. He does not drink much water. Wife states he drinks Pepsi all day long.   Medications: reviewed   Labs: glucose 116, albumin 2.8  Anthropometrics: Wt 168 lb 6.9 oz today (fluctuations r/t repeat thoracentesis)  12/26 - 169 lb 14.4 oz 12/3 - 172 lb 6.4 oz  11/12 - 178 lb 12.8 oz   NUTRITION DIAGNOSIS: Unintended wt loss - ongoing    INTERVENTION:  Educated on small frequent meals/snacks high in calories and protein Suggested soft moist high protein foods for ease of intake Continue drinking Boost Plus, recommend 2-3/day Handouts with snack ideas, protein foods, and cook book provided  Resume megace for appetite per MD    MONITORING, EVALUATION, GOAL: weight trends, intake    NEXT VISIT: Thursday February 6 during infusion

## 2023-09-01 NOTE — Progress Notes (Signed)
Keytruda given today per MD orders. Tolerated infusion without adverse affects. Vital signs stable. No complaints at this time. Discharged from clinic by wheel chair in stable condition. Alert and oriented x 3. F/U with [facility] as scheduled.

## 2023-09-01 NOTE — Progress Notes (Signed)
[facility] [address], [place] [address]    Clinic Day:  09/01/2023  Referring physician: Elfredia Nevins, MD  Patient Care Team: Elfredia Nevins, MD as PCP - General (Internal Medicine) Marinus Maw, MD as PCP - Electrophysiology (Cardiology) Doreatha Massed, MD as Medical Oncologist (Medical Oncology) Therese Sarah, RN as Oncology Nurse Navigator (Medical Oncology)   ASSESSMENT & PLAN:   Assessment: 1.  Metastatic urothelial carcinoma of the bladder to the lungs and bones: - Neoadjuvant chemotherapy with gemcitabine and cisplatin cycle 1 on 05/02/2017 - Cystoprostatectomy, bilateral lymphadenectomy by Dr. Berneice Heinrich on 06/29/2017, pathology T3b N0, 0/13 lymph nodes - Developed metastatic disease in April 2019, biopsy of the right ischium on 12/08/2017 with poorly differentiated carcinoma consistent with metastatic urothelial carcinoma. - XRT to the pelvis completed on 01/16/2018 - Carboplatin and gemcitabine 6 cycles from 01/30/2018 through 05/22/2018 - Pembrolizumab every 3 weeks started on 06/21/2018, discontinued in April 2020 due to skin toxicity - XRT to the right paratracheal lymph node, 50 Gray in 10 fractions completed on 04/23/2020 - XRT to the intrathoracic lymph node completed on 11/27/2021  - 12/20/2022: Right thoracentesis with 1.8 L removed - 12/21/2022: Left thoracentesis with 700 mL fluid removed, cytology negative. - Unclear etiology of recurrent effusions, exudative. - 2D echocardiogram: Normal LVEF with grade 1 diastolic dysfunction. - CT angiogram 12/20/2022: Negative for PE.  Multifocal parenchymal opacities.  No adenopathy. - Keytruda started on 01/07/2023.   2.  Social/family history: - Lives at home with his wife. - Worked for [employer].  Quit smoking 40 years ago. - No family history of malignancies.    Plan: 1.  Metastatic urothelial carcinoma of the bladder to the lungs and bones: - Last PET scan on 06/23/2023 with  no evidence of recurrence in the chest, abdomen and pelvis.  No evidence of active skeletal metastatic disease.  Stable large right pleural effusion and small left pleural effusion. - Reviewed labs today: Normal LFTs and creatinine.  Albumin is low at 2.8.  CBC grossly normal.  TSH is 3.1. - He does not have any immunotherapy related side effects.  He reportedly had bleeding into the urostomy bag and Eliquis (A-fib) was stopped. - Continue Keytruda every 3 weeks.  RTC 6 weeks with repeat CT CAP with contrast.   2.  Recurrent right pleural effusion: - Thoracentesis on 08/19/2023, 1.2 L.  On 08/03/2023 with 2.1 L removed.  On 07/20/2023 with 1.1 L removed.  On 07/08/2023 2 L removed. - He is arranged to have thoracentesis every 2 weeks if needed.  His breathing has improved.  His cough has improved after fluid removed.   3.  Loss of appetite/weight loss: - He lost about 10 pounds in the last 2 months.  Reports decreased appetite.  He is eating 2 meals per day.  Averages about 1 boost per day. - Will start him back on Megace 400 mg twice daily.   4.  Immunotherapy induced skin rash: - Continue prednisone 10 mg daily.  Rash and itching has improved.   5.  Cancer-related fatigue: - Continue Ritalin 10 mg in the mornings as needed.  He has not been requiring lately.    Orders Placed This Encounter  Procedures   US THORACENTESIS ASP PLEURAL SPACE W/IMG GUIDE    Standing Status:   Future    Expiration Date:   08/31/2024    Are labs required for specimen collection?:   No    Reason for  Exam (SYMPTOM  OR DIAGNOSIS REQUIRED):   pleural effusion    Preferred imaging location?:   [facility]   US THORACENTESIS ASP PLEURAL SPACE W/IMG GUIDE    Standing Status:   Future    Expiration Date:   08/31/2024    Are labs required for specimen collection?:   No    Reason for Exam (SYMPTOM  OR DIAGNOSIS REQUIRED):   pleural effusion    Preferred imaging location?:   [facility]   US  THORACENTESIS ASP PLEURAL SPACE W/IMG GUIDE    Standing Status:   Future    Expiration Date:   08/31/2024    Are labs required for specimen collection?:   No    Reason for Exam (SYMPTOM  OR DIAGNOSIS REQUIRED):   pleural effusion    Preferred imaging location?:   [facility]   US THORACENTESIS ASP PLEURAL SPACE W/IMG GUIDE    Standing Status:   Future    Expiration Date:   08/31/2024    Are labs required for specimen collection?:   No    Reason for Exam (SYMPTOM  OR DIAGNOSIS REQUIRED):   pleural effusion    Preferred imaging location?:   [facility]   US THORACENTESIS ASP PLEURAL SPACE W/IMG GUIDE    Standing Status:   Future    Expiration Date:   08/31/2024    Are labs required for specimen collection?:   No    Reason for Exam (SYMPTOM  OR DIAGNOSIS REQUIRED):   pleural effusion    Preferred imaging location?:   [facility]   CT CHEST ABDOMEN PELVIS W CONTRAST    Standing Status:   Future    Expected Date:   10/02/2023    Expiration Date:   08/31/2024    If indicated for the ordered procedure, I authorize the administration of contrast media per Radiology protocol:   Yes    Does the patient have a contrast media/X-ray dye allergy?:   No    Preferred imaging location?:   [facility]    If indicated for the ordered procedure, I authorize the administration of oral contrast media per Radiology protocol:   Yes   Magnesium    Standing Status:   Future    Expected Date:   10/11/2023    Expiration Date:   10/10/2024   CBC with Differential    Standing Status:   Future    Expected Date:   10/11/2023    Expiration Date:   10/10/2024   Comprehensive metabolic panel    Standing Status:   Future    Expected Date:   10/11/2023    Expiration Date:   10/10/2024   Magnesium    Standing Status:   Future    Expected Date:   11/01/2023    Expiration Date:   10/31/2024   CBC with Differential    Standing Status:   Future    Expected Date:   11/01/2023    Expiration Date:    10/31/2024   Comprehensive metabolic panel    Standing Status:   Future    Expected Date:   11/01/2023    Expiration Date:   10/31/2024   T4    Standing Status:   Future    Expected Date:   11/01/2023    Expiration Date:   10/31/2024   TSH    Standing Status:   Future    Expected Date:   11/01/2023    Expiration Date:  10/31/2024      I,Helena R Teague,acting as a scribe for Doreatha Massed, MD.,have documented all relevant documentation on the behalf of Doreatha Massed, MD,as directed by  Doreatha Massed, MD while in the presence of Doreatha Massed, MD.  I, Doreatha Massed MD, have reviewed the above documentation for accuracy and completeness, and I agree with the above.     Doreatha Massed, MD   1/16/20254:21 PM  CHIEF COMPLAINT:   Diagnosis: Metastatic urothelial carcinoma of the bladder    Cancer Staging  No matching staging information was found for the patient.    Prior Therapy: 1. Neoadjuvant gemcitabine/cisplatin on 05/02/17 (one cycle) 2. Cystoprostatectomy and bilateral lymphadenectomy on 06/29/2017 (Dr. Berneice Heinrich) 3. Radiation therapy to the pelvic lesion 01/02/18 - 01/16/18 4. Carboplatin and gemcitabine, 6 cycles 02/06/18 - 05/22/18 5. Pembrolizumab 06/21/18 - 12/13/18 6. Radiation therapy to RUL and right paratracheal nodes 04/09/20 - 04/23/20 7. Radiation therapy to intrathoracic lymph nodes 11/16/21 - 11/27/21  Current Therapy:  pembrolizumab    HISTORY OF PRESENT ILLNESS:   Oncology History  Stage IV malignant neoplasm of urinary bladder /with mets to the bones and lungs  04/20/2017 Initial Diagnosis   Malignant neoplasm of urinary bladder (HCC)   01/30/2018 - 05/22/2018 Chemotherapy   The patient had palonosetron (ALOXI) injection 0.25 mg, 0.25 mg, Intravenous,  Once, 6 of 6 cycles Administration: 0.25 mg (01/30/2018), 0.25 mg (02/20/2018), 0.25 mg (03/13/2018), 0.25 mg (04/03/2018), 0.25 mg (05/01/2018), 0.25 mg (05/22/2018) CARBOplatin (PARAPLATIN)  400 mg in sodium chloride 0.9 % 250 mL chemo infusion, 400 mg (100 % of original dose 400 mg), Intravenous,  Once, 6 of 6 cycles Dose modification: 400 mg (original dose 400 mg, Cycle 1),   (original dose 400 mg, Cycle 2, Reason: Patient Age), 410 mg (original dose 400 mg, Cycle 6) Administration: 400 mg (01/30/2018), 400 mg (02/20/2018), 400 mg (03/13/2018), 410 mg (04/03/2018), 410 mg (05/01/2018), 410 mg (05/22/2018) gemcitabine (GEMZAR) 2,128 mg in sodium chloride 0.9 % 250 mL chemo infusion, 1,000 mg/m2 = 2,128 mg, Intravenous,  Once, 6 of 6 cycles Dose modification: 800 mg/m2 (original dose 1,000 mg/m2, Cycle 4, Reason: Provider Judgment), 800 mg/m2 (original dose 1,000 mg/m2, Cycle 4, Reason: Provider Judgment) Administration: 2,128 mg (01/30/2018), 2,128 mg (02/06/2018), 2,128 mg (02/20/2018), 2,128 mg (02/27/2018), 2,128 mg (03/13/2018), 1,710 mg (04/03/2018), 1,710 mg (04/10/2018), 1,710 mg (05/01/2018), 1,710 mg (05/22/2018) fosaprepitant (EMEND) 150 mg, dexamethasone (DECADRON) 12 mg in sodium chloride 0.9 % 145 mL IVPB, , Intravenous,  Once, 6 of 6 cycles Administration:  (01/30/2018),  (02/20/2018),  (03/13/2018),  (04/03/2018),  (05/01/2018),  (05/22/2018)  for chemotherapy treatment.    01/07/2023 -  Chemotherapy   Patient is on Treatment Plan : BLADDER Pembrolizumab (200) q21d     Bladder cancer (HCC)  06/28/2017 Initial Diagnosis   Bladder cancer (HCC)   06/21/2018 - 12/13/2018 Chemotherapy   The patient had pembrolizumab (KEYTRUDA) 200 mg in sodium chloride 0.9 % 50 mL chemo infusion, 200 mg, Intravenous, Once, 8 of 9 cycles Administration: 200 mg (06/21/2018), 200 mg (07/12/2018), 200 mg (08/02/2018), 200 mg (08/23/2018), 200 mg (09/13/2018), 200 mg (10/04/2018), 200 mg (11/22/2018), 200 mg (12/13/2018)  for chemotherapy treatment.       INTERVAL HISTORY:   Dailin is a 83 y.o. male presenting to clinic today for follow up of Metastatic urothelial carcinoma of the bladder. He was last seen by me on  06/28/23.  Today, he states that he is doing well overall. His appetite level is at 40%.  His energy level is at 25%. He is accompanied by his wife.   His wife notes he has decreased appetite. He has lost 10 pounds since his last visit. He drinks 1 Boost/Ensure a day. Megace improved appetite when he was first given it and he is agreeable to take it again. His breathing and cough have improved after his last thoracentesis. He was prescribed Vitamin D and Synthroid at his last PCP visit in early January 2025. He is taking Ritalin as needed, which effectively improves energy levels.   His wife notes he had multiple episodes of hematuria and epistaxis at the end of 2024. His PCP recommended he discontinue Eliquis and symptoms have since resolved.  PAST MEDICAL HISTORY:  0 Past Medical History: Past Medical History:  Diagnosis Date   Bladder cancer (HCC)    Bone cancer (HCC)    BPH (benign prostatic hypertrophy)    Dysuria    GERD (gastroesophageal reflux disease)    History of adenomatous polyp of colon    tubular adenoma 06/ 2018   History of bladder cancer 12/2014   urologist-  dr Sherryl Barters--  dx High Grade TCC without stromal invasion s/p TURBT and intravesical BCG tx/  recurrent 07/2015 (pTa) TCC  repear intravesical BCG tx    History of hiatal hernia    History of urethral stricture    Nocturia     Surgical History: Past Surgical History:  Procedure Laterality Date   CARDIAC CATHETERIZATION  1997   normal coronary arteries   CARDIOVASCULAR STRESS TEST  12-14-2005   Low risk perfusion study/  very small scar in the inferior septum from mid ventricle to apex,  no ischemia/  mild inferior septal hypokinesis/  ef 58%   CATARACT EXTRACTION W/ INTRAOCULAR LENS  IMPLANT, BILATERAL  2011   COLONOSCOPY  last one 06/ 2018   CYSTOSCOPY WITH BIOPSY N/A 08/12/2015   Procedure: CYSTOSCOPY WITH BIOPSY AND FULGERATION;  Surgeon: Hildred Laser, MD;  Location: [facility];   Service: Urology;  Laterality: N/A;   CYSTOSCOPY WITH INJECTION N/A 06/29/2017   Procedure: CYSTOSCOPY WITH INJECTION OF INDOCYANINE GREEN DYE;  Surgeon: Sebastian Ache, MD;  Location: [facility];  Service: Urology;  Laterality: N/A;   CYSTOSCOPY WITH URETHRAL DILATATION N/A 03/21/2017   Procedure: CYSTOSCOPY;  Surgeon: Hildred Laser, MD;  Location: [facility];  Service: Urology;  Laterality: N/A;   ESOPHAGOGASTRODUODENOSCOPY  12-05-2014   IR IMAGING GUIDED PORT INSERTION  02/07/2018   IR NEPHROSTOMY PLACEMENT LEFT  12/25/2021   IR REMOVAL TUN ACCESS W/ PORT W/O FL MOD SED  04/14/2018   TRANSTHORACIC ECHOCARDIOGRAM  01-31-2008   nomral LVF,  ef 55-65%/  mild pulmonic stenosis without regurg.,  peak transpulomonic valve gradient   TRANSURETHRAL RESECTION OF BLADDER TUMOR N/A 12/31/2014   Procedure: TRANSURETHRAL RESECTION OF BLADDER TUMOR (TURBT);  Surgeon: Su Grand, MD;  Location: [facility];  Service: Urology;  Laterality: N/A;   TRANSURETHRAL RESECTION OF BLADDER TUMOR N/A 03/21/2017   Procedure: POSSIBLE TRANSURETHRAL RESECTION OF BLADDER TUMOR (TURBT);  Surgeon: Hildred Laser, MD;  Location: [facility];  Service: Urology;  Laterality: N/A;    Social History: Social History   Socioeconomic History   Marital status: Married    Spouse name: Not on file   Number of children: 1   Years of education: Not on file   Highest education level: Not on file  Occupational History   Occupation: Retired  Tobacco Use  Smoking status: Former    Current packs/day: 0.00    Average packs/day: 1 pack/day for 34.0 years (34.0 ttl pk-yrs)    Types: Cigarettes    Start date: 12/27/1954    Quit date: 12/26/1988    Years since quitting: 34.7   Smokeless tobacco: Never  Vaping Use   Vaping status: Never Used  Substance and Sexual Activity   Alcohol use: Yes    Alcohol/week: 6.0 standard drinks of alcohol    Types: 6 Cans of beer per week     Comment: BEER   Drug use: No   Sexual activity: Not Currently  Other Topics Concern   Not on file  Social History Narrative   Not on file   Social Drivers of Health   Financial Resource Strain: Not on file  Food Insecurity: No Food Insecurity (12/20/2022)   Hunger Vital Sign    Worried About Running Out of Food in the Last Year: Never true    Ran Out of Food in the Last Year: Never true  Transportation Needs: No Transportation Needs (12/20/2022)   PRAPARE - Administrator, Civil Service (Medical): No    Lack of Transportation (Non-Medical): No  Physical Activity: Not on file  Stress: Not on file  Social Connections: Unknown (12/28/2021)   [facility]   Social Network    Social Network: Not on file  Intimate Partner Violence: Not At Risk (12/20/2022)   Humiliation, Afraid, Rape, and Kick questionnaire    Fear of Current or Ex-Partner: No    Emotionally Abused: No    Physically Abused: No    Sexually Abused: No    Family History: Family History  Problem Relation Age of Onset   Alcoholism Father    Colon cancer Neg Hx    Colon polyps Neg Hx    Diabetes Neg Hx    Kidney disease Neg Hx    Esophageal cancer Neg Hx    Heart disease Neg Hx    Gallbladder disease Neg Hx    Allergic rhinitis Neg Hx    Angioedema Neg Hx    Asthma Neg Hx    Atopy Neg Hx    Eczema Neg Hx    Immunodeficiency Neg Hx    Urticaria Neg Hx     Current Medications:  Current Outpatient Medications:    acetaminophen (TYLENOL) 500 MG tablet, Take 1,000 mg by mouth every 6 (six) hours as needed for mild pain, fever, moderate pain or headache., Disp: , Rfl:    betamethasone dipropionate 0.05 % cream, Apply topically 2 (two) times daily., Disp: 60 g, Rfl: 2   budesonide-formoterol (SYMBICORT) 160-4.5 MCG/ACT inhaler, Inhale 2 puffs into the lungs 2 (two) times daily., Disp: 1 each, Rfl: 6   chlorpheniramine-HYDROcodone (TUSSIONEX) 10-8 MG/5ML, Take 5 mLs by mouth  every 12 (twelve) hours as needed (severe cough)., Disp: 70 mL, Rfl: 0   dextromethorphan-guaiFENesin (MUCINEX DM) 30-600 MG 12hr tablet, Take 1 tablet by mouth 2 (two) times daily., Disp: , Rfl:    hydrOXYzine (ATARAX) 25 MG tablet, Take 1 tablet (25 mg total) by mouth 3 (three) times daily as needed., Disp: 90 tablet, Rfl: 0   levothyroxine (SYNTHROID) 75 MCG tablet, Take 75 mcg by mouth daily., Disp: , Rfl:    megestrol (MEGACE) 400 MG/10ML suspension, Take 10 mLs (400 mg total) by mouth 2 (two) times daily., Disp: 480 mL, Rfl: 3   methenamine (HIPREX) 1 g tablet, Take 1 tablet (1 g  total) by mouth 2 (two) times daily., Disp: 60 tablet, Rfl: 11   methylphenidate (RITALIN) 10 MG tablet, TAKE 1 TAB DAILY IF NEEDED. MAX 3 TABS/DAY., Disp: 30 tablet, Rfl: 0   metoprolol tartrate (LOPRESSOR) 25 MG tablet, Take 1 tablet (25 mg total) by mouth 2 (two) times daily., Disp: 60 tablet, Rfl: 2   pantoprazole (PROTONIX) 40 MG tablet, Take 1 tablet (40 mg total) by mouth 2 (two) times daily., Disp: 60 tablet, Rfl: 0   predniSONE (DELTASONE) 10 MG tablet, Take 1 tablet (10 mg total) by mouth daily with breakfast., Disp: 30 tablet, Rfl: 4   SSD 1 % cream, Apply topically 2 (two) times daily., Disp: , Rfl:    Vitamin D, Ergocalciferol, (DRISDOL) 1.25 MG (50000 UNIT) CAPS capsule, Take 50,000 Units by mouth once a week., Disp: , Rfl:    apixaban (ELIQUIS) 5 MG TABS tablet, Take 1 tablet (5 mg total) by mouth 2 (two) times daily. (Patient not taking: Reported on 09/01/2023), Disp: 28 tablet, Rfl: 0   apixaban (ELIQUIS) 5 MG TABS tablet, Take 1 tablet (5 mg total) by mouth 2 (two) times daily. (Patient not taking: Reported on 09/01/2023), Disp: 28 tablet, Rfl: 0  Current Facility-Administered Medications:    omalizumab Geoffry Paradise) prefilled syringe 300 mg, 300 mg, Subcutaneous, Q14 Days, Alfonse Spruce, MD, 300 mg at 11/24/22 9147  Facility-Administered Medications Ordered in Other Visits:    sodium chloride  flush (NS) 0.9 % injection 10 mL, 10 mL, Intracatheter, PRN, Doreatha Massed, MD, 10 mL at 09/01/23 1603   Allergies: Allergies  Allergen Reactions   Ciprofloxacin Hives   Zofran [Ondansetron] Rash    REVIEW OF SYSTEMS:   Review of Systems  Constitutional:  Negative for chills, fatigue and fever.  HENT:   Negative for lump/mass, mouth sores, nosebleeds, sore throat and trouble swallowing.   Eyes:  Negative for eye problems.  Respiratory:  Positive for cough and shortness of breath.   Cardiovascular:  Negative for chest pain, leg swelling and palpitations.  Gastrointestinal:  Negative for abdominal pain, constipation, diarrhea, nausea and vomiting.  Genitourinary:  Negative for bladder incontinence, difficulty urinating, dysuria, frequency, hematuria and nocturia.   Musculoskeletal:  Negative for arthralgias, back pain, flank pain, myalgias and neck pain.  Skin:  Negative for itching and rash.  Neurological:  Negative for dizziness, headaches and numbness.  Hematological:  Does not bruise/bleed easily.  Psychiatric/Behavioral:  Negative for depression, sleep disturbance and suicidal ideas. The patient is not nervous/anxious.   All other systems reviewed and are negative.    VITALS:   Blood pressure 108/73, pulse 93, temperature 98.3 F (36.8 C), temperature source Oral, resp. rate 18, weight 168 lb 6.9 oz (76.4 kg), SpO2 94%.  Wt Readings from Last 3 Encounters:  09/01/23 168 lb 6.9 oz (76.4 kg)  08/11/23 169 lb 14.4 oz (77.1 kg)  07/19/23 172 lb 6.4 oz (78.2 kg)    Body mass index is 23.16 kg/m.  Performance status (ECOG): 1 - Symptomatic but completely ambulatory  PHYSICAL EXAM:   Physical Exam Vitals and nursing note reviewed. Exam conducted with a chaperone present.  Constitutional:      Appearance: Normal appearance.  Cardiovascular:     Rate and Rhythm: Normal rate and regular rhythm.     Pulses: Normal pulses.     Heart sounds: Normal heart sounds.   Pulmonary:     Effort: Pulmonary effort is normal.     Breath sounds: Examination of the right-lower field reveals  decreased breath sounds. Decreased breath sounds present.  Abdominal:     Palpations: Abdomen is soft. There is no hepatomegaly, splenomegaly or mass.     Tenderness: There is no abdominal tenderness.  Musculoskeletal:     Right lower leg: No edema.     Left lower leg: No edema.  Lymphadenopathy:     Cervical: No cervical adenopathy.     Right cervical: No superficial, deep or posterior cervical adenopathy.    Left cervical: No superficial, deep or posterior cervical adenopathy.     Upper Body:     Right upper body: No supraclavicular or axillary adenopathy.     Left upper body: No supraclavicular or axillary adenopathy.  Neurological:     General: No focal deficit present.     Mental Status: He is alert and oriented to person, place, and time.  Psychiatric:        Mood and Affect: Mood normal.        Behavior: Behavior normal.     LABS:      Latest Ref Rng & Units 09/01/2023   12:49 PM 08/11/2023    9:14 AM 07/19/2023   12:23 PM  CBC  WBC 4.0 - 10.5 K/uL 9.6  18.3  9.9   Hemoglobin 13.0 - 17.0 g/dL 56.4  33.2  95.1   Hematocrit 39.0 - 52.0 % 47.1  48.8  44.4   Platelets 150 - 400 K/uL 176  186  248       Latest Ref Rng & Units 09/01/2023   12:49 PM 08/11/2023    9:14 AM 07/19/2023   12:23 PM  CMP  Glucose 70 - 99 mg/dL 884  166  90   BUN 8 - 23 mg/dL 20  20  18    Creatinine 0.61 - 1.24 mg/dL 0.63  0.16  0.10   Sodium 135 - 145 mmol/L 139  139  144   Potassium 3.5 - 5.1 mmol/L 3.7  4.0  3.5   Chloride 98 - 111 mmol/L 109  112  112   CO2 22 - 32 mmol/L 22  19  23    Calcium 8.9 - 10.3 mg/dL 8.7  8.5  8.8   Total Protein 6.5 - 8.1 g/dL 5.5  5.7  5.2   Total Bilirubin 0.0 - 1.2 mg/dL 0.9  1.0  0.5   Alkaline Phos 38 - 126 U/L 57  49  45   AST 15 - 41 U/L 13  17  14    ALT 0 - 44 U/L 14  9  16       No results found for: "CEA1", "CEA" / No results found  for: "CEA1", "CEA" No results found for: "PSA1" No results found for: "XNA355" No results found for: "CAN125"  No results found for: "TOTALPROTELP", "ALBUMINELP", "A1GS", "A2GS", "BETS", "BETA2SER", "GAMS", "MSPIKE", "SPEI" Lab Results  Component Value Date   TIBC 249 03/20/2018   FERRITIN 759 (H) 03/20/2018   IRONPCTSAT 20 (L) 03/20/2018   Lab Results  Component Value Date   LDH 96 (L) 06/21/2022     STUDIES:   US THORACENTESIS ASP PLEURAL SPACE W/IMG GUIDE Result Date: 08/19/2023 INDICATION: Patient with history of metastatic bladder cancer, recurrent bilateral pleural effusions. Request for therapeutic thoracentesis. EXAM: ULTRASOUND GUIDED RIGHT THORACENTESIS MEDICATIONS: 6 mL 1% lidocaine COMPLICATIONS: None immediate. PROCEDURE: An ultrasound guided thoracentesis was thoroughly discussed with the patient and questions answered. The benefits, risks, alternatives and complications were also discussed. The patient understands and wishes to proceed with the  procedure. Written consent was obtained. Ultrasound was performed to localize and mark an adequate pocket of fluid in the right chest. The area was then prepped and draped in the normal sterile fashion. 1% Lidocaine was used for local anesthesia. Under ultrasound guidance a 19 gauge, 7-cm, Yueh catheter was introduced. Thoracentesis was performed. The catheter was removed and a dressing applied. FINDINGS: A total of approximately 1.2 L of clear yellow fluid was removed. IMPRESSION: Successful ultrasound guided right thoracentesis yielding 1.2 L of pleural fluid. Performed by Lynnette Caffey, PA-C Electronically Signed   By: Marliss Coots M.D.   On: 08/19/2023 11:24   DG Chest 1 View Result Date: 08/19/2023 CLINICAL DATA:  Post right-sided thoracentesis EXAM: CHEST  1 VIEW COMPARISON:  08/03/2023; 07/20/2023 FINDINGS: Unchanged trace residual right-sided pleural effusion post thoracentesis. No pneumothorax. Unchanged cardiac silhouette and  mediastinal contours. Unchanged trace left-sided pleural effusion. No evidence of edema. No acute osseous abnormalities. IMPRESSION: Unchanged trace residual right-sided pleural effusion post thoracentesis. No pneumothorax. Electronically Signed   By: Simonne Come M.D.   On: 08/19/2023 11:03   US THORACENTESIS ASP PLEURAL SPACE W/IMG GUIDE Result Date: 08/03/2023 INDICATION: Symptomatic right pleural effusion EXAM: ULTRASOUND GUIDED RIGHT THORACENTESIS MEDICATIONS: None. COMPLICATIONS: None immediate. PROCEDURE: An ultrasound guided thoracentesis was thoroughly discussed with the patient and questions answered. The benefits, risks (including pneumothorax, hemorrhage, and infection), and alternatives to the procedure were discussed. The patient understands and wishes to proceed with the procedure. Written consent was obtained. Ultrasound was performed to localize and mark an adequate pocket of fluid in the right chest. The area was then prepped and draped in the normal sterile fashion. 1% Lidocaine was used for local anesthesia. Under ultrasound guidance a 6 Fr Safe-T-Centesis style catheter was introduced. Thoracentesis was performed. The catheter was removed and a dressing applied. FINDINGS: A total of approximately 2.1 of straw-colored fluid was removed. IMPRESSION: Successful ultrasound guided right thoracentesis yielding 2.1 L of pleural fluid. Electronically Signed   By: Gaylyn Rong M.D.   On: 08/03/2023 12:30   DG Chest 1 View Result Date: 08/03/2023 CLINICAL DATA:  Status post right thoracentesis, right pleural effusion EXAM: CHEST  1 VIEW COMPARISON:  07/20/2023 FINDINGS: Small residual bilateral pleural effusions. No pneumothorax. Stable scarring in the right lung and left perihilar region. Heart size within normal limits. IMPRESSION: 1. Small residual bilateral pleural effusions. No pneumothorax. 2. Stable scarring in the right lung and left perihilar region. Electronically Signed   By:  Gaylyn Rong M.D.   On: 08/03/2023 12:27

## 2023-09-01 NOTE — Patient Instructions (Signed)
[facility] [place] - [facility]  Discharge Instructions: Thank you for choosing [facility] to provide your oncology and hematology care.  If you have a lab appointment with the Cancer Center - please note that after April 8th, 2024, all labs will be drawn in the cancer center.  You do not have to check in or register with the main entrance as you have in the past but will complete your check-in in the cancer center.  Wear comfortable clothing and clothing appropriate for easy access to any Portacath or PICC line.   We strive to give you quality time with your provider. You may need to reschedule your appointment if you arrive late (15 or more minutes).  Arriving late affects you and other patients whose appointments are after yours.  Also, if you miss three or more appointments without notifying the office, you may be dismissed from the clinic at the provider's discretion.      For prescription refill requests, have your pharmacy contact our office and allow 72 hours for refills to be completed.    Today you received the following chemotherapy and/or immunotherapy agents keytruda Pembrolizumab Injection What is this medication? PEMBROLIZUMAB (PEM broe LIZ ue mab) treats some types of cancer. It works by helping your immune system slow or stop the spread of cancer cells. It is a monoclonal antibody. This medicine may be used for other purposes; ask your health care provider or pharmacist if you have questions. COMMON BRAND NAME(S): Keytruda What should I tell my care team before I take this medication? They need to know if you have any of these conditions: Allogeneic stem cell transplant (uses someone else's stem cells) Autoimmune diseases, such as Crohn disease, ulcerative colitis, lupus History of chest radiation Nervous system problems, such as Guillain-Barre syndrome, myasthenia gravis Organ transplant An unusual or allergic reaction to  pembrolizumab, other medications, foods, dyes, or preservatives Pregnant or trying to get pregnant Breast-feeding How should I use this medication? This medication is injected into a vein. It is given by your care team in a hospital or clinic setting. A special MedGuide will be given to you before each treatment. Be sure to read this information carefully each time. Talk to your care team about the use of this medication in children. While it may be prescribed for children as young as 6 months for selected conditions, precautions do apply. Overdosage: If you think you have taken too much of this medicine contact a poison control center or emergency room at once. NOTE: This medicine is only for you. Do not share this medicine with others. What if I miss a dose? Keep appointments for follow-up doses. It is important not to miss your dose. Call your care team if you are unable to keep an appointment. What may interact with this medication? Interactions have not been studied. This list may not describe all possible interactions. Give your health care provider a list of all the medicines, herbs, non-prescription drugs, or dietary supplements you use. Also tell them if you smoke, drink alcohol, or use illegal drugs. Some items may interact with your medicine. What should I watch for while using this medication? Your condition will be monitored carefully while you are receiving this medication. You may need blood work while taking this medication. This medication may cause serious skin reactions. They can happen weeks to months after starting the medication. Contact your care team right away if you notice  fevers or flu-like symptoms with a rash. The rash may be red or purple and then turn into blisters or peeling of the skin. You may also notice a red rash with swelling of the face, lips, or lymph nodes in your neck or under your arms. Tell your care team right away if you have any change in your  eyesight. Talk to your care team if you may be pregnant. Serious birth defects can occur if you take this medication during pregnancy and for 4 months after the last dose. You will need a negative pregnancy test before starting this medication. Contraception is recommended while taking this medication and for 4 months after the last dose. Your care team can help you find the option that works for you. Do not breastfeed while taking this medication and for 4 months after the last dose. What side effects may I notice from receiving this medication? Side effects that you should report to your care team as soon as possible: Allergic reactions--skin rash, itching, hives, swelling of the face, lips, tongue, or throat Dry cough, shortness of breath or trouble breathing Eye pain, redness, irritation, or discharge with blurry or decreased vision Heart muscle inflammation--unusual weakness or fatigue, shortness of breath, chest pain, fast or irregular heartbeat, dizziness, swelling of the ankles, feet, or hands Hormone gland problems--headache, sensitivity to light, unusual weakness or fatigue, dizziness, fast or irregular heartbeat, increased sensitivity to cold or heat, excessive sweating, constipation, hair loss, increased thirst or amount of urine, tremors or shaking, irritability Infusion reactions--chest pain, shortness of breath or trouble breathing, feeling faint or lightheaded Kidney injury (glomerulonephritis)--decrease in the amount of urine, red or dark brown urine, foamy or bubbly urine, swelling of the ankles, hands, or feet Liver injury--right upper belly pain, loss of appetite, nausea, light-colored stool, dark yellow or brown urine, yellowing skin or eyes, unusual weakness or fatigue Pain, tingling, or numbness in the hands or feet, muscle weakness, change in vision, confusion or trouble speaking, loss of balance or coordination, trouble walking, seizures Rash, fever, and swollen lymph  nodes Redness, blistering, peeling, or loosening of the skin, including inside the mouth Sudden or severe stomach pain, bloody diarrhea, fever, nausea, vomiting Side effects that usually do not require medical attention (report to your care team if they continue or are bothersome): Bone, joint, or muscle pain Diarrhea Fatigue Loss of appetite Nausea Skin rash This list may not describe all possible side effects. Call your doctor for medical advice about side effects. You may report side effects to FDA at 1-800-FDA-1088. Where should I keep my medication? This medication is given in a hospital or clinic. It will not be stored at home. NOTE: This sheet is a summary. It may not cover all possible information. If you have questions about this medicine, talk to your doctor, pharmacist, or health care provider.  2024 Elsevier/Gold Standard (2021-12-15 00:00:00)       To help prevent nausea and vomiting after your treatment, we encourage you to take your nausea medication as directed.  BELOW ARE SYMPTOMS THAT SHOULD BE REPORTED IMMEDIATELY: *FEVER GREATER THAN 100.4 F (38 C) OR HIGHER *CHILLS OR SWEATING *NAUSEA AND VOMITING THAT IS NOT CONTROLLED WITH YOUR NAUSEA MEDICATION *UNUSUAL SHORTNESS OF BREATH *UNUSUAL BRUISING OR BLEEDING *URINARY PROBLEMS (pain or burning when urinating, or frequent urination) *BOWEL PROBLEMS (unusual diarrhea, constipation, pain near the anus) TENDERNESS IN MOUTH AND THROAT WITH OR WITHOUT PRESENCE OF ULCERS (sore throat, sores in mouth, or a toothache) UNUSUAL RASH, SWELLING  OR PAIN  UNUSUAL VAGINAL DISCHARGE OR ITCHING   Items with * indicate a potential emergency and should be followed up as soon as possible or go to the Emergency Department if any problems should occur.  Please show the CHEMOTHERAPY ALERT CARD or IMMUNOTHERAPY ALERT CARD at check-in to the Emergency Department and triage nurse.  Should you have questions after your visit or need to  cancel or reschedule your appointment, please contact [facility] [place] - A DEPT OF [facility] [phone]  and follow the prompts.  Office hours are 8:00 a.m. to 4:30 p.m. Monday - Friday. Please note that voicemails left after 4:00 p.m. may not be returned until the following business day.  We are closed weekends and major holidays. You have access to a nurse at all times for urgent questions. Please call the main number to the clinic [phone] and follow the prompts.  For any non-urgent questions, you may also contact your provider using MyChart. We now offer e-Visits for anyone 42 and older to request care online for non-urgent symptoms. For details visit [email or web address].   Also download the MyChart app! Go to the app store, search "MyChart", open the app, select [place], and log in with your MyChart username and password.

## 2023-09-01 NOTE — Progress Notes (Signed)
Patient has been examined by Dr. Katragadda. Vital signs and labs have been reviewed by MD - ANC, Creatinine, LFTs, hemoglobin, and platelets are within treatment parameters per M.D. - pt may proceed with treatment.  Primary RN and pharmacy notified.  

## 2023-09-01 NOTE — Patient Instructions (Signed)

## 2023-09-01 NOTE — Progress Notes (Signed)
Patient presents today for [facility], patient okay for treatment per Dr. Ellin Saba. Labs within treatment parameters.

## 2023-09-02 ENCOUNTER — Ambulatory Visit (HOSPITAL_COMMUNITY)
Admission: RE | Admit: 2023-09-02 | Discharge: 2023-09-02 | Disposition: A | Discharge status: Home / Self Care | Payer: Medicare Other | Source: Ambulatory Visit | Attending: Interventional Radiology | Admitting: Interventional Radiology

## 2023-09-02 ENCOUNTER — Ambulatory Visit (HOSPITAL_COMMUNITY)
Admission: RE | Admit: 2023-09-02 | Discharge: 2023-09-02 | Disposition: A | Discharge status: Home / Self Care | Payer: Medicare Other | Source: Ambulatory Visit | Attending: Hematology | Admitting: Hematology

## 2023-09-02 ENCOUNTER — Other Ambulatory Visit: Payer: Self-pay

## 2023-09-02 DIAGNOSIS — Z48813 Encounter for surgical aftercare following surgery on the respiratory system: Secondary | ICD-10-CM | POA: Diagnosis not present

## 2023-09-02 DIAGNOSIS — J90 Pleural effusion, not elsewhere classified: Secondary | ICD-10-CM | POA: Insufficient documentation

## 2023-09-02 LAB — T4: T4, Total: 9.1 ug/dL (ref 4.5–12.0)

## 2023-09-02 MED ORDER — LIDOCAINE HCL (PF) 2 % IJ SOLN
10.0000 mL | Freq: Once | INTRAMUSCULAR | Status: AC
  Administered 2023-09-02: 10 mL

## 2023-09-02 MED ORDER — LIDOCAINE HCL (PF) 2 % IJ SOLN
INTRAMUSCULAR | Status: AC
  Filled 2023-09-02: qty 10

## 2023-09-02 NOTE — Progress Notes (Signed)
Patient tolerated right sided thoracentesis procedure well today and 1.2 Liters of clear yellow pleural fluid removed. Patient verbalized understanding of discharge instructions and ambulatory to xray to have post chest xray at this time with no acute distress noted.

## 2023-09-16 ENCOUNTER — Ambulatory Visit (HOSPITAL_COMMUNITY)
Admission: RE | Admit: 2023-09-16 | Discharge: 2023-09-16 | Disposition: A | Discharge status: Home / Self Care | Payer: Medicare Other | Source: Ambulatory Visit | Attending: Physician Assistant | Admitting: Physician Assistant

## 2023-09-16 ENCOUNTER — Ambulatory Visit (HOSPITAL_COMMUNITY)
Admission: RE | Admit: 2023-09-16 | Discharge: 2023-09-16 | Disposition: A | Discharge status: Home / Self Care | Payer: Medicare Other | Source: Ambulatory Visit | Attending: Hematology | Admitting: Hematology

## 2023-09-16 ENCOUNTER — Encounter (HOSPITAL_COMMUNITY): Payer: Self-pay

## 2023-09-16 DIAGNOSIS — R918 Other nonspecific abnormal finding of lung field: Secondary | ICD-10-CM | POA: Diagnosis not present

## 2023-09-16 DIAGNOSIS — J90 Pleural effusion, not elsewhere classified: Secondary | ICD-10-CM | POA: Insufficient documentation

## 2023-09-16 DIAGNOSIS — C679 Malignant neoplasm of bladder, unspecified: Secondary | ICD-10-CM | POA: Insufficient documentation

## 2023-09-16 DIAGNOSIS — Z48813 Encounter for surgical aftercare following surgery on the respiratory system: Secondary | ICD-10-CM | POA: Diagnosis not present

## 2023-09-16 MED ORDER — LIDOCAINE HCL (PF) 2 % IJ SOLN
INTRAMUSCULAR | Status: AC
  Filled 2023-09-16: qty 10

## 2023-09-16 MED ORDER — LIDOCAINE HCL (PF) 2 % IJ SOLN
10.0000 mL | Freq: Once | INTRAMUSCULAR | Status: AC
  Administered 2023-09-16: 10 mL

## 2023-09-16 NOTE — Procedures (Signed)
PROCEDURE SUMMARY:  Successful image-guided right thoracentesis. Yielded 2.3 liters of clear yellow fluid. Patient tolerated procedure well. EBL: Zero No immediate complications.  Specimen was not sent for labs. Post procedure CXR shows no pneumothorax.  Please see imaging section of Epic for full dictation.  Villa Herb PA-C 09/16/2023 10:28 AM

## 2023-09-16 NOTE — Progress Notes (Signed)
Patient tolerated right sided thoracentesis procedure well today and 2.3 Liters of clear yellow pleural fluid removed. Patient transported via wheelchair at this time to have post chest xray completed with no acute distress noted and verbalized understanding of discharge instructions.

## 2023-09-22 ENCOUNTER — Other Ambulatory Visit: Payer: Medicare Other

## 2023-09-22 ENCOUNTER — Inpatient Hospital Stay: Payer: Medicare Other

## 2023-09-22 ENCOUNTER — Inpatient Hospital Stay: Payer: Medicare Other | Admitting: Dietician

## 2023-09-22 ENCOUNTER — Inpatient Hospital Stay: Payer: Medicare Other | Attending: Oncology

## 2023-09-22 VITALS — BP 104/55 | HR 91 | Temp 97.6°F | Resp 19

## 2023-09-22 DIAGNOSIS — C7801 Secondary malignant neoplasm of right lung: Secondary | ICD-10-CM | POA: Diagnosis not present

## 2023-09-22 DIAGNOSIS — C679 Malignant neoplasm of bladder, unspecified: Secondary | ICD-10-CM

## 2023-09-22 DIAGNOSIS — Z79620 Long term (current) use of immunosuppressive biologic: Secondary | ICD-10-CM | POA: Diagnosis not present

## 2023-09-22 DIAGNOSIS — C7802 Secondary malignant neoplasm of left lung: Secondary | ICD-10-CM | POA: Diagnosis not present

## 2023-09-22 DIAGNOSIS — C7951 Secondary malignant neoplasm of bone: Secondary | ICD-10-CM | POA: Insufficient documentation

## 2023-09-22 DIAGNOSIS — Z5112 Encounter for antineoplastic immunotherapy: Secondary | ICD-10-CM | POA: Diagnosis not present

## 2023-09-22 LAB — CBC WITH DIFFERENTIAL/PLATELET
Abs Immature Granulocytes: 0.06 10*3/uL (ref 0.00–0.07)
Basophils Absolute: 0.1 10*3/uL (ref 0.0–0.1)
Basophils Relative: 1 %
Eosinophils Absolute: 0.2 10*3/uL (ref 0.0–0.5)
Eosinophils Relative: 2 %
HCT: 44.5 % (ref 39.0–52.0)
Hemoglobin: 14.3 g/dL (ref 13.0–17.0)
Immature Granulocytes: 1 %
Lymphocytes Relative: 8 %
Lymphs Abs: 0.7 10*3/uL (ref 0.7–4.0)
MCH: 35.3 pg — ABNORMAL HIGH (ref 26.0–34.0)
MCHC: 32.1 g/dL (ref 30.0–36.0)
MCV: 109.9 fL — ABNORMAL HIGH (ref 80.0–100.0)
Monocytes Absolute: 0.7 10*3/uL (ref 0.1–1.0)
Monocytes Relative: 7 %
Neutro Abs: 8 10*3/uL — ABNORMAL HIGH (ref 1.7–7.7)
Neutrophils Relative %: 81 %
Platelets: 195 10*3/uL (ref 150–400)
RBC: 4.05 MIL/uL — ABNORMAL LOW (ref 4.22–5.81)
RDW: 13.5 % (ref 11.5–15.5)
WBC: 9.8 10*3/uL (ref 4.0–10.5)
nRBC: 0 % (ref 0.0–0.2)

## 2023-09-22 LAB — COMPREHENSIVE METABOLIC PANEL
ALT: 11 U/L (ref 0–44)
AST: 15 U/L (ref 15–41)
Albumin: 2.3 g/dL — ABNORMAL LOW (ref 3.5–5.0)
Alkaline Phosphatase: 43 U/L (ref 38–126)
Anion gap: 8 (ref 5–15)
BUN: 15 mg/dL (ref 8–23)
CO2: 23 mmol/L (ref 22–32)
Calcium: 8.4 mg/dL — ABNORMAL LOW (ref 8.9–10.3)
Chloride: 108 mmol/L (ref 98–111)
Creatinine, Ser: 1.05 mg/dL (ref 0.61–1.24)
GFR, Estimated: >60 mL/min (ref >60)
Glucose, Bld: 90 mg/dL (ref 70–99)
Potassium: 3.8 mmol/L (ref 3.5–5.1)
Sodium: 139 mmol/L (ref 135–145)
Total Bilirubin: 0.8 mg/dL (ref 0.0–1.2)
Total Protein: 5.1 g/dL — ABNORMAL LOW (ref 6.5–8.1)

## 2023-09-22 LAB — MAGNESIUM: Magnesium: 2.2 mg/dL (ref 1.7–2.4)

## 2023-09-22 MED ORDER — SODIUM CHLORIDE 0.9 % IV SOLN
200.0000 mg | Freq: Once | INTRAVENOUS | Status: AC
  Administered 2023-09-22: 200 mg via INTRAVENOUS
  Filled 2023-09-22: qty 8

## 2023-09-22 MED ORDER — SODIUM CHLORIDE 0.9 % IV SOLN: Freq: Once | INTRAVENOUS | Status: AC

## 2023-09-22 NOTE — Patient Instructions (Signed)
 [facility] [place] - A DEPT OF [facility]  Discharge Instructions: Thank you for choosing  Cancer Center to provide your oncology and hematology care.  If you have a lab appointment with the Cancer Center - please note that after April 8th, 2024, all labs will be drawn in the cancer center.  You do not have to check in or register with the main entrance as you have in the past but will complete your check-in in the cancer center.  Wear comfortable clothing and clothing appropriate for easy access to any Portacath or PICC line.   We strive to give you quality time with your provider. You may need to reschedule your appointment if you arrive late (15 or more minutes).  Arriving late affects you and other patients whose appointments are after yours.  Also, if you miss three or more appointments without notifying the office, you may be dismissed from the clinic at the provider's discretion.      For prescription refill requests, have your pharmacy contact our office and allow 72 hours for refills to be completed.    Today you received the following chemotherapy and/or immunotherapy agents Keytruda. Pembrolizumab Injection What is this medication? PEMBROLIZUMAB (PEM broe LIZ ue mab) treats some types of cancer. It works by helping your immune system slow or stop the spread of cancer cells. It is a monoclonal antibody. This medicine may be used for other purposes; ask your health care provider or pharmacist if you have questions. COMMON BRAND NAME(S): Keytruda What should I tell my care team before I take this medication? They need to know if you have any of these conditions: Allogeneic stem cell transplant (uses someone else's stem cells) Autoimmune diseases, such as Crohn disease, ulcerative colitis, lupus History of chest radiation Nervous system problems, such as Guillain-Barre syndrome, myasthenia gravis Organ transplant An unusual or allergic reaction to  pembrolizumab, other medications, foods, dyes, or preservatives Pregnant or trying to get pregnant Breast-feeding How should I use this medication? This medication is injected into a vein. It is given by your care team in a hospital or clinic setting. A special MedGuide will be given to you before each treatment. Be sure to read this information carefully each time. Talk to your care team about the use of this medication in children. While it may be prescribed for children as young as 6 months for selected conditions, precautions do apply. Overdosage: If you think you have taken too much of this medicine contact a poison control center or emergency room at once. NOTE: This medicine is only for you. Do not share this medicine with others. What if I miss a dose? Keep appointments for follow-up doses. It is important not to miss your dose. Call your care team if you are unable to keep an appointment. What may interact with this medication? Interactions have not been studied. This list may not describe all possible interactions. Give your health care provider a list of all the medicines, herbs, non-prescription drugs, or dietary supplements you use. Also tell them if you smoke, drink alcohol, or use illegal drugs. Some items may interact with your medicine. What should I watch for while using this medication? Your condition will be monitored carefully while you are receiving this medication. You may need blood work while taking this medication. This medication may cause serious skin reactions. They can happen weeks to months after starting the medication. Contact your care team right away if you notice  fevers or flu-like symptoms with a rash. The rash may be red or purple and then turn into blisters or peeling of the skin. You may also notice a red rash with swelling of the face, lips, or lymph nodes in your neck or under your arms. Tell your care team right away if you have any change in your  eyesight. Talk to your care team if you may be pregnant. Serious birth defects can occur if you take this medication during pregnancy and for 4 months after the last dose. You will need a negative pregnancy test before starting this medication. Contraception is recommended while taking this medication and for 4 months after the last dose. Your care team can help you find the option that works for you. Do not breastfeed while taking this medication and for 4 months after the last dose. What side effects may I notice from receiving this medication? Side effects that you should report to your care team as soon as possible: Allergic reactions--skin rash, itching, hives, swelling of the face, lips, tongue, or throat Dry cough, shortness of breath or trouble breathing Eye pain, redness, irritation, or discharge with blurry or decreased vision Heart muscle inflammation--unusual weakness or fatigue, shortness of breath, chest pain, fast or irregular heartbeat, dizziness, swelling of the ankles, feet, or hands Hormone gland problems--headache, sensitivity to light, unusual weakness or fatigue, dizziness, fast or irregular heartbeat, increased sensitivity to cold or heat, excessive sweating, constipation, hair loss, increased thirst or amount of urine, tremors or shaking, irritability Infusion reactions--chest pain, shortness of breath or trouble breathing, feeling faint or lightheaded Kidney injury (glomerulonephritis)--decrease in the amount of urine, red or dark brown urine, foamy or bubbly urine, swelling of the ankles, hands, or feet Liver injury--right upper belly pain, loss of appetite, nausea, light-colored stool, dark yellow or brown urine, yellowing skin or eyes, unusual weakness or fatigue Pain, tingling, or numbness in the hands or feet, muscle weakness, change in vision, confusion or trouble speaking, loss of balance or coordination, trouble walking, seizures Rash, fever, and swollen lymph  nodes Redness, blistering, peeling, or loosening of the skin, including inside the mouth Sudden or severe stomach pain, bloody diarrhea, fever, nausea, vomiting Side effects that usually do not require medical attention (report to your care team if they continue or are bothersome): Bone, joint, or muscle pain Diarrhea Fatigue Loss of appetite Nausea Skin rash This list may not describe all possible side effects. Call your doctor for medical advice about side effects. You may report side effects to FDA at 1-800-FDA-1088. Where should I keep my medication? This medication is given in a hospital or clinic. It will not be stored at home. NOTE: This sheet is a summary. It may not cover all possible information. If you have questions about this medicine, talk to your doctor, pharmacist, or health care provider.  2024 Elsevier/Gold Standard (2021-12-15 00:00:00)       To help prevent nausea and vomiting after your treatment, we encourage you to take your nausea medication as directed.  BELOW ARE SYMPTOMS THAT SHOULD BE REPORTED IMMEDIATELY: *FEVER GREATER THAN 100.4 F (38 C) OR HIGHER *CHILLS OR SWEATING *NAUSEA AND VOMITING THAT IS NOT CONTROLLED WITH YOUR NAUSEA MEDICATION *UNUSUAL SHORTNESS OF BREATH *UNUSUAL BRUISING OR BLEEDING *URINARY PROBLEMS (pain or burning when urinating, or frequent urination) *BOWEL PROBLEMS (unusual diarrhea, constipation, pain near the anus) TENDERNESS IN MOUTH AND THROAT WITH OR WITHOUT PRESENCE OF ULCERS (sore throat, sores in mouth, or a toothache) UNUSUAL RASH, SWELLING  OR PAIN  UNUSUAL VAGINAL DISCHARGE OR ITCHING   Items with * indicate a potential emergency and should be followed up as soon as possible or go to the Emergency Department if any problems should occur.  Please show the CHEMOTHERAPY ALERT CARD or IMMUNOTHERAPY ALERT CARD at check-in to the Emergency Department and triage nurse.  Should you have questions after your visit or need to  cancel or reschedule your appointment, please contact [facility] CANCER CTR [place] - A DEPT OF [facility] [phone]  and follow the prompts.  Office hours are 8:00 a.m. to 4:30 p.m. Monday - Friday. Please note that voicemails left after 4:00 p.m. may not be returned until the following business day.  We are closed weekends and major holidays. You have access to a nurse at all times for urgent questions. Please call the main number to the clinic [phone] and follow the prompts.  For any non-urgent questions, you may also contact your provider using MyChart. We now offer e-Visits for anyone 42 and older to request care online for non-urgent symptoms. For details visit [email or web address].   Also download the MyChart app! Go to the app store, search "MyChart", open the app, select [place], and log in with your MyChart username and password.

## 2023-09-22 NOTE — Progress Notes (Signed)
 Patient presents today for Keytruda  infusion. Labs within parameters for treatment.   Keytruda  given today per MD orders. Tolerated infusion without adverse affects. Vital signs stable. No complaints at this time. Discharged from clinic  by wheel chair in stable condition. Alert and oriented x 3. F/U with [facility] as scheduled.

## 2023-09-22 NOTE — Progress Notes (Signed)
 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 He is receiving keytruda  q21d.    Therapeutic thoracentesis: 1/31 - yield 2.3 L 1/17 - yield 1.2 L 1/3 - yield 1.2 L 12/18 - yield 2.1 L 12/4 - yield 1.1 L  Met with patient and wife in infusion. Patient eating pack of cheese nabs at visit. Wife reports offering small portions of food has been helpful. He usually is able to finish the plate. Patient is doing this once a day at supper. Patient is not taking megace  as prescribed. Wife reports he is taking this once daily instead of two. Patient has had increased coughing spells recently. Wife states he is exhausted after this and does not want to eat or drink. Patient has not been drinking Boost lately due to increased mucous/phlegm. Previously drinking 2-3/day. He does not drink water . Wife says he likes juices and continues to drink a lot of Pepsi. Patient denies nausea, vomiting, diarrhea, constipation.    Medications: prednisone , drisdol , ritalin , megace    Labs: albumin 2.3  Anthropometrics: No new wts to trend  1/16 - 168 lb 6.9 oz 12/26 - 169 lb 14.4 oz    NUTRITION DIAGNOSIS: Unintended wt loss - suspect ongoing    INTERVENTION:  Continue offering small portions - encourage high calorie high protein foods Suggested pouring Boost over ice for thinner consistency - recommend 3/day as tolerated Take appetite stimulant as prescribed     MONITORING, EVALUATION, GOAL: wt trends, intake   NEXT VISIT: Monday March 3 during infusion

## 2023-09-30 ENCOUNTER — Ambulatory Visit (HOSPITAL_COMMUNITY)
Admission: RE | Admit: 2023-09-30 | Discharge: 2023-09-30 | Disposition: A | Discharge status: Home / Self Care | Payer: Medicare Other | Source: Ambulatory Visit | Attending: Physician Assistant | Admitting: Physician Assistant

## 2023-09-30 ENCOUNTER — Ambulatory Visit (HOSPITAL_COMMUNITY)
Admission: RE | Admit: 2023-09-30 | Discharge: 2023-09-30 | Disposition: A | Discharge status: Home / Self Care | Payer: Medicare Other | Source: Ambulatory Visit | Attending: Hematology | Admitting: Hematology

## 2023-09-30 ENCOUNTER — Encounter (HOSPITAL_COMMUNITY): Payer: Self-pay

## 2023-09-30 DIAGNOSIS — J90 Pleural effusion, not elsewhere classified: Secondary | ICD-10-CM | POA: Diagnosis not present

## 2023-09-30 DIAGNOSIS — R918 Other nonspecific abnormal finding of lung field: Secondary | ICD-10-CM | POA: Diagnosis not present

## 2023-09-30 DIAGNOSIS — Z48813 Encounter for surgical aftercare following surgery on the respiratory system: Secondary | ICD-10-CM | POA: Diagnosis not present

## 2023-09-30 MED ORDER — LIDOCAINE HCL (PF) 2 % IJ SOLN
10.0000 mL | Freq: Once | INTRAMUSCULAR | Status: AC
  Administered 2023-09-30: 10 mL via INTRADERMAL

## 2023-09-30 NOTE — Procedures (Signed)
PROCEDURE SUMMARY:  Successful image-guided right thoracentesis. Yielded 1.9 liters of clear yellow fluid. Patient tolerated procedure well. EBL < 1 mL No immediate complications.  Specimen was not sent for labs. Post procedure CXR shows no pneumothorax.  Patient noted to have new left pleural effusion which appears amenable to thoracentesis in the future.  Please see imaging section of Epic for full dictation.  Villa Herb PA-C 09/30/2023 10:56 AM

## 2023-09-30 NOTE — Progress Notes (Signed)
Thoracentesis completed. 1.9 L removed from right lung. VSS. Instructions given and pt verbalized understanding. Pt in wheelchair and wheeled out of facility.

## 2023-09-30 NOTE — Progress Notes (Signed)
Pt became diaphoretic after procedure. Pt placed on 2 L [place] for support. VSS.

## 2023-09-30 NOTE — Progress Notes (Signed)
Patient had been taken off nasal cannula oxygen and stated "I feel better and can go home now." Chest xray ready by Dr. Miles Costain and approval to discharge patient if he clinically felt better. Patient's wife updated and patient left via wheelchair with her with no acute distress noted.

## 2023-10-03 ENCOUNTER — Ambulatory Visit (HOSPITAL_COMMUNITY)
Admission: RE | Admit: 2023-10-03 | Discharge: 2023-10-03 | Disposition: A | Discharge status: Home / Self Care | Payer: Medicare Other | Source: Ambulatory Visit | Attending: Student | Admitting: Student

## 2023-10-03 ENCOUNTER — Ambulatory Visit (HOSPITAL_COMMUNITY)
Admission: RE | Admit: 2023-10-03 | Discharge: 2023-10-03 | Disposition: A | Discharge status: Home / Self Care | Payer: Medicare Other | Source: Ambulatory Visit | Attending: Hematology | Admitting: Hematology

## 2023-10-03 ENCOUNTER — Encounter (HOSPITAL_COMMUNITY): Payer: Self-pay

## 2023-10-03 VITALS — BP 114/73 | HR 95 | Resp 17

## 2023-10-03 DIAGNOSIS — J90 Pleural effusion, not elsewhere classified: Secondary | ICD-10-CM | POA: Insufficient documentation

## 2023-10-03 DIAGNOSIS — Z95828 Presence of other vascular implants and grafts: Secondary | ICD-10-CM

## 2023-10-03 DIAGNOSIS — Z48813 Encounter for surgical aftercare following surgery on the respiratory system: Secondary | ICD-10-CM | POA: Diagnosis not present

## 2023-10-03 MED ORDER — LIDOCAINE HCL (PF) 2 % IJ SOLN
10.0000 mL | Freq: Once | INTRAMUSCULAR | Status: AC
  Administered 2023-10-03: 10 mL via INTRADERMAL

## 2023-10-03 MED ORDER — LIDOCAINE HCL (PF) 2 % IJ SOLN
INTRAMUSCULAR | Status: AC
  Filled 2023-10-03: qty 10

## 2023-10-03 MED ORDER — LIDOCAINE HCL (PF) 2 % IJ SOLN
INTRAMUSCULAR | Status: AC
Start: 2023-10-03 — End: ?
  Filled 2023-10-03: qty 10

## 2023-10-03 NOTE — Progress Notes (Signed)
 Pt had left sided thoracentesis completed today. removed successfully. Chest xray completed. Tolerated well. VSS. Pt wheeled out of facility in no acute distress.

## 2023-10-03 NOTE — Procedures (Signed)
 PROCEDURE SUMMARY:  Successful US guided left thoracentesis. Yielded 950 mL of straw colored fluid. Pt tolerated procedure well. No immediate complications.  No labs requested. CXR ordered.No evidence of post procedure pneumothorax upon review by Dr. Donne Hazel.   EBL < 5 mL  Maisa Bedingfield N Beniah Magnan PA-C 10/03/2023 11:09 AM

## 2023-10-04 ENCOUNTER — Other Ambulatory Visit: Payer: Self-pay

## 2023-10-07 ENCOUNTER — Ambulatory Visit (HOSPITAL_COMMUNITY)
Admission: RE | Admit: 2023-10-07 | Discharge: 2023-10-07 | Disposition: A | Discharge status: Home / Self Care | Payer: Medicare Other | Source: Ambulatory Visit | Attending: Hematology | Admitting: Hematology

## 2023-10-07 ENCOUNTER — Ambulatory Visit (HOSPITAL_COMMUNITY)
Admission: RE | Admit: 2023-10-07 | Discharge: 2023-10-07 | Disposition: A | Discharge status: Home / Self Care | Payer: Medicare Other | Source: Ambulatory Visit | Attending: Physician Assistant | Admitting: Physician Assistant

## 2023-10-07 ENCOUNTER — Encounter (HOSPITAL_COMMUNITY): Payer: Self-pay

## 2023-10-07 ENCOUNTER — Ambulatory Visit (HOSPITAL_COMMUNITY)
Admission: RE | Admit: 2023-10-07 | Discharge: 2023-10-07 | Discharge status: Home / Self Care | Payer: Medicare Other | Source: Ambulatory Visit | Attending: Hematology | Admitting: Hematology

## 2023-10-07 DIAGNOSIS — K8020 Calculus of gallbladder without cholecystitis without obstruction: Secondary | ICD-10-CM | POA: Diagnosis not present

## 2023-10-07 DIAGNOSIS — C679 Malignant neoplasm of bladder, unspecified: Secondary | ICD-10-CM | POA: Insufficient documentation

## 2023-10-07 DIAGNOSIS — R0989 Other specified symptoms and signs involving the circulatory and respiratory systems: Secondary | ICD-10-CM | POA: Diagnosis not present

## 2023-10-07 DIAGNOSIS — Z48813 Encounter for surgical aftercare following surgery on the respiratory system: Secondary | ICD-10-CM | POA: Diagnosis not present

## 2023-10-07 DIAGNOSIS — J90 Pleural effusion, not elsewhere classified: Secondary | ICD-10-CM | POA: Insufficient documentation

## 2023-10-07 DIAGNOSIS — R918 Other nonspecific abnormal finding of lung field: Secondary | ICD-10-CM | POA: Diagnosis not present

## 2023-10-07 MED ORDER — IOHEXOL 300 MG/ML  SOLN: 50.0000 mL | Freq: Once | INTRAMUSCULAR | Status: DC | PRN

## 2023-10-07 MED ORDER — LIDOCAINE HCL (PF) 2 % IJ SOLN
10.0000 mL | Freq: Once | INTRAMUSCULAR | Status: AC
  Administered 2023-10-07: 10 mL

## 2023-10-07 MED ORDER — IOHEXOL 300 MG/ML  SOLN
100.0000 mL | Freq: Once | INTRAMUSCULAR | Status: AC | PRN
  Administered 2023-10-07: 100 mL via INTRAVENOUS

## 2023-10-07 NOTE — Procedures (Signed)
 PROCEDURE SUMMARY:  Successful image-guided right thoracentesis. Yielded 1.6 liters of clear yellow fluid. Patient tolerated procedure well. EBL: Zero No immediate complications.  Specimen was not sent for labs. Post procedure CXR shows no pneumothorax.  Please see imaging section of Epic for full dictation.  Villa Herb PA-C 10/07/2023 11:14 AM

## 2023-10-07 NOTE — Progress Notes (Signed)
 Right sided Thoracentesis procedure and post chest xray completed at this time and 1.6 Liters of clear yellow pleural fluid removed. Patient transported back to waiting room at this time with wife and drinking contrast for CT scan scheduled for 12:00 today with no acute distress and verbalized understanding of discharge instructions.

## 2023-10-10 ENCOUNTER — Ambulatory Visit: Payer: Medicare Other | Admitting: Urology

## 2023-10-10 VITALS — BP 116/79 | HR 81

## 2023-10-10 DIAGNOSIS — Z87440 Personal history of urinary (tract) infections: Secondary | ICD-10-CM | POA: Diagnosis not present

## 2023-10-10 DIAGNOSIS — C679 Malignant neoplasm of bladder, unspecified: Secondary | ICD-10-CM

## 2023-10-10 DIAGNOSIS — N390 Urinary tract infection, site not specified: Secondary | ICD-10-CM

## 2023-10-10 NOTE — Progress Notes (Unsigned)
 10/10/2023 11:30 AM   [name] Jan 11, 1941 [id number]  Referring provider: Elfredia Nevins, MD [address] [place],  [place] [address]  Followup frequent UTI   HPI: [name] is a 82yo here for followup for recurrent UTI. He has been going every 1-2 weeks for thoracentesis. He had 3 episodes of gross hematuria and then he stopped his eliquis and the hematuria resolved. No UTI since last visit.    PMH: Past Medical History:  Diagnosis Date   Bladder cancer (HCC)    Bone cancer (HCC)    BPH (benign prostatic hypertrophy)    Dysuria    GERD (gastroesophageal reflux disease)    History of adenomatous polyp of colon    tubular adenoma 06/ 2018   History of bladder cancer 12/2014   urologist-  dr Sherryl Barters--  dx High Grade TCC without stromal invasion s/p TURBT and intravesical BCG tx/  recurrent 07/2015 (pTa) TCC  repear intravesical BCG tx    History of hiatal hernia    History of urethral stricture    Nocturia     Surgical History: Past Surgical History:  Procedure Laterality Date   CARDIAC CATHETERIZATION  1997   normal coronary arteries   CARDIOVASCULAR STRESS TEST  12-14-2005   Low risk perfusion study/  very small scar in the inferior septum from mid ventricle to apex,  no ischemia/  mild inferior septal hypokinesis/  ef 58%   CATARACT EXTRACTION W/ INTRAOCULAR LENS  IMPLANT, BILATERAL  2011   COLONOSCOPY  last one 06/ 2018   CYSTOSCOPY WITH BIOPSY N/A 08/12/2015   Procedure: CYSTOSCOPY WITH BIOPSY AND FULGERATION;  Surgeon: Hildred Laser, MD;  Location: [facility];  Service: Urology;  Laterality: N/A;   CYSTOSCOPY WITH INJECTION N/A 06/29/2017   Procedure: CYSTOSCOPY WITH INJECTION OF INDOCYANINE GREEN DYE;  Surgeon: Sebastian Ache, MD;  Location: [facility];  Service: Urology;  Laterality: N/A;   CYSTOSCOPY WITH URETHRAL DILATATION N/A 03/21/2017   Procedure: CYSTOSCOPY;  Surgeon: Hildred Laser, MD;  Location: [facility];   Service: Urology;  Laterality: N/A;   ESOPHAGOGASTRODUODENOSCOPY  12-05-2014   IR IMAGING GUIDED PORT INSERTION  02/07/2018   IR NEPHROSTOMY PLACEMENT LEFT  12/25/2021   IR REMOVAL TUN ACCESS W/ PORT W/O FL MOD SED  04/14/2018   TRANSTHORACIC ECHOCARDIOGRAM  01-31-2008   nomral LVF,  ef 55-65%/  mild pulmonic stenosis without regurg.,  peak transpulomonic valve gradient   TRANSURETHRAL RESECTION OF BLADDER TUMOR N/A 12/31/2014   Procedure: TRANSURETHRAL RESECTION OF BLADDER TUMOR (TURBT);  Surgeon: Su Grand, MD;  Location: [facility];  Service: Urology;  Laterality: N/A;   TRANSURETHRAL RESECTION OF BLADDER TUMOR N/A 03/21/2017   Procedure: POSSIBLE TRANSURETHRAL RESECTION OF BLADDER TUMOR (TURBT);  Surgeon: Hildred Laser, MD;  Location: [facility];  Service: Urology;  Laterality: N/A;    Home Medications:  Allergies as of 10/10/2023       Reactions   Ciprofloxacin Hives   Zofran [ondansetron] Rash        Medication List        Accurate as of October 10, 2023 11:30 AM. If you have any questions, ask your nurse or doctor.          acetaminophen 500 MG tablet Commonly known as: TYLENOL Take 1,000 mg by mouth every 6 (six) hours as needed for mild pain, fever, moderate pain or headache.   apixaban 5 MG Tabs tablet Commonly known as: ELIQUIS  Take 1 tablet (5 mg total) by mouth 2 (two) times daily.   apixaban 5 MG Tabs tablet Commonly known as: Eliquis Take 1 tablet (5 mg total) by mouth 2 (two) times daily.   betamethasone dipropionate 0.05 % cream Apply topically 2 (two) times daily.   budesonide-formoterol 160-4.5 MCG/ACT inhaler Commonly known as: Symbicort Inhale 2 puffs into the lungs 2 (two) times daily.   chlorpheniramine-HYDROcodone 10-8 MG/5ML Commonly known as: TUSSIONEX Take 5 mLs by mouth every 12 (twelve) hours as needed (severe cough).   dextromethorphan-guaiFENesin 30-600 MG 12hr tablet Commonly known as:  MUCINEX DM Take 1 tablet by mouth 2 (two) times daily.   hydrOXYzine 25 MG tablet Commonly known as: ATARAX Take 1 tablet (25 mg total) by mouth 3 (three) times daily as needed.   levothyroxine 75 MCG tablet Commonly known as: SYNTHROID Take 75 mcg by mouth daily.   megestrol 400 MG/10ML suspension Commonly known as: MEGACE Take 10 mLs (400 mg total) by mouth 2 (two) times daily.   methenamine 1 g tablet Commonly known as: HIPREX Take 1 tablet (1 g total) by mouth 2 (two) times daily.   methylphenidate 10 MG tablet Commonly known as: RITALIN TAKE 1 TAB DAILY IF NEEDED. MAX 3 TABS/DAY.   metoprolol tartrate 25 MG tablet Commonly known as: LOPRESSOR Take 1 tablet (25 mg total) by mouth 2 (two) times daily.   pantoprazole 40 MG tablet Commonly known as: PROTONIX Take 1 tablet (40 mg total) by mouth 2 (two) times daily.   predniSONE 10 MG tablet Commonly known as: DELTASONE Take 1 tablet (10 mg total) by mouth daily with breakfast.   SSD 1 % cream Generic drug: silver sulfADIAZINE Apply topically 2 (two) times daily.   Vitamin D (Ergocalciferol) 1.25 MG (50000 UNIT) Caps capsule Commonly known as: DRISDOL Take 50,000 Units by mouth once a week.        Allergies:  Allergies  Allergen Reactions   Ciprofloxacin Hives   Zofran [Ondansetron] Rash    Family History: Family History  Problem Relation Age of Onset   Alcoholism Father    Colon cancer Neg Hx    Colon polyps Neg Hx    Diabetes Neg Hx    Kidney disease Neg Hx    Esophageal cancer Neg Hx    Heart disease Neg Hx    Gallbladder disease Neg Hx    Allergic rhinitis Neg Hx    Angioedema Neg Hx    Asthma Neg Hx    Atopy Neg Hx    Eczema Neg Hx    Immunodeficiency Neg Hx    Urticaria Neg Hx     Social History:  reports that he quit smoking about 34 years ago. His smoking use included cigarettes. He started smoking about 68 years ago. He has a 34 pack-year smoking history. He has never used smokeless  tobacco. He reports current alcohol use of about 6.0 standard drinks of alcohol per week. He reports that he does not use drugs.  ROS: All other review of systems were reviewed and are negative except what is noted above in HPI  Physical Exam: BP 116/79   Pulse 81   Constitutional:  Alert and oriented, No acute distress. HEENT: [place] AT, moist mucus membranes.  Trachea midline, no masses. Cardiovascular: No clubbing, cyanosis, or edema. Respiratory: Normal respiratory effort, no increased work of breathing. GI: Abdomen is soft, nontender, nondistended, no abdominal masses GU: No CVA tenderness.  Lymph: No cervical or inguinal lymphadenopathy. Skin: No  rashes, bruises or suspicious lesions. Neurologic: Grossly intact, no focal deficits, moving all 4 extremities. Psychiatric: Normal mood and affect.  Laboratory Data: Lab Results  Component Value Date   WBC 9.8 09/22/2023   HGB 14.3 09/22/2023   HCT 44.5 09/22/2023   MCV 109.9 (H) 09/22/2023   PLT 195 09/22/2023    Lab Results  Component Value Date   CREATININE 1.05 09/22/2023    No results found for: "PSA"  No results found for: "TESTOSTERONE"  Lab Results  Component Value Date   HGBA1C 5.1 06/16/2020    Urinalysis    Component Value Date/Time   COLORURINE YELLOW 12/20/2022 1344   APPEARANCEUR HAZY (A) 12/20/2022 1344   APPEARANCEUR Clear 09/17/2022 1023   LABSPEC 1.014 12/20/2022 1344   PHURINE 6.0 12/20/2022 1344   GLUCOSEU NEGATIVE 12/20/2022 1344   HGBUR NEGATIVE 12/20/2022 1344   BILIRUBINUR NEGATIVE 12/20/2022 1344   BILIRUBINUR Negative 09/17/2022 1023   KETONESUR NEGATIVE 12/20/2022 1344   PROTEINUR NEGATIVE 12/20/2022 1344   NITRITE NEGATIVE 12/20/2022 1344   LEUKOCYTESUR NEGATIVE 12/20/2022 1344    Lab Results  Component Value Date   LABMICR See below: 09/17/2022   WBCUA 6-10 (A) 09/17/2022   LABEPIT 0-10 09/17/2022   MUCUS Present 04/23/2022   BACTERIA MANY (A) 12/20/2022    Pertinent  Imaging:  No results found for this or any previous visit.  Results for orders placed during the hospital encounter of 01/25/18  US Venous Img Lower Bilateral  Narrative CLINICAL DATA:  Bilateral pain and edema  EXAM: BILATERAL LOWER EXTREMITY VENOUS DOPPLER ULTRASOUND  TECHNIQUE: Gray-scale sonography with graded compression, as well as color Doppler and duplex ultrasound were performed to evaluate the lower extremity deep venous systems from the level of the common femoral vein and including the common femoral, femoral, profunda femoral, popliteal and calf veins including the posterior tibial, peroneal and gastrocnemius veins when visible. The superficial great saphenous vein was also interrogated. Spectral Doppler was utilized to evaluate flow at rest and with distal augmentation maneuvers in the common femoral, femoral and popliteal veins.  COMPARISON:  None.  FINDINGS: RIGHT LOWER EXTREMITY  Common Femoral Vein: No evidence of thrombus. Normal compressibility, respiratory phasicity and response to augmentation.  Saphenofemoral Junction: No evidence of thrombus. Normal compressibility and flow on color Doppler imaging.  Profunda Femoral Vein: No evidence of thrombus. Normal compressibility and flow on color Doppler imaging.  Femoral Vein: No evidence of thrombus. Normal compressibility, respiratory phasicity and response to augmentation.  Popliteal Vein: No evidence of thrombus. Normal compressibility, respiratory phasicity and response to augmentation.  Calf Veins: No evidence of thrombus. Normal compressibility and flow on color Doppler imaging.  Superficial Great Saphenous Vein: No evidence of thrombus. Normal compressibility.  Venous Reflux:  None.  Other Findings:  None.  LEFT LOWER EXTREMITY  Common Femoral Vein: No evidence of thrombus. Normal compressibility, respiratory phasicity and response to augmentation.  Saphenofemoral Junction: No  evidence of thrombus. Normal compressibility and flow on color Doppler imaging.  Profunda Femoral Vein: No evidence of thrombus. Normal compressibility and flow on color Doppler imaging.  Femoral Vein: No evidence of thrombus. Normal compressibility, respiratory phasicity and response to augmentation.  Popliteal Vein: No evidence of thrombus. Normal compressibility, respiratory phasicity and response to augmentation.  Calf Veins: No evidence of thrombus. Normal compressibility and flow on color Doppler imaging.  Superficial Great Saphenous Vein: No evidence of thrombus. Normal compressibility.  Venous Reflux:  None.  Other Findings:  None.  IMPRESSION: No evidence  of deep venous thrombosis.   Electronically Signed By: Judie Petit.  Shick M.D. On: 01/27/2018 11:02  No results found for this or any previous visit.  No results found for this or any previous visit.  Results for orders placed during the hospital encounter of 01/25/18  US RENAL  Narrative CLINICAL DATA:  UTI, fever  EXAM: RENAL / URINARY TRACT ULTRASOUND COMPLETE  COMPARISON:  CT 11/25/2017  FINDINGS: Right Kidney:  Length: 11.3 cm. Normal echotexture. No hydronephrosis. 2.3 cm cyst in the midpole.  Left Kidney:  Length: 11.3 cm. Echogenicity within normal limits. No mass or hydronephrosis visualized.  Bladder:  Prior cystectomy.  IMPRESSION: No hydronephrosis or acute findings.   Electronically Signed By: Charlett Nose M.D. On: 01/27/2018 10:56  No results found for this or any previous visit.  No results found for this or any previous visit.  Results for orders placed during the hospital encounter of 05/05/22  CT Renal Stone Study  Narrative CLINICAL DATA:  Flank pain, kidney stone suspected. Pt with blood in urine started this afternoon. Recent with UTI  EXAM: CT ABDOMEN AND PELVIS WITHOUT CONTRAST  TECHNIQUE: Multidetector CT imaging of the abdomen and pelvis was  performed following the standard protocol without IV contrast.  RADIATION DOSE REDUCTION: This exam was performed according to the departmental dose-optimization program which includes automated exposure control, adjustment of the mA and/or kV according to patient size and/or use of iterative reconstruction technique.  COMPARISON:  CT abdomen pelvis 04/11/2022  FINDINGS: Lower chest: Trace to small right and trace left pleural effusions. Small hiatal hernia.  Hepatobiliary: Several fluid density lesions within the right hepatic lobe likely represent simple renal cysts. No pulmonary nodule. No pulmonary mass. No pleural effusion. No pneumothorax.  Pancreas: No focal lesion. Normal pancreatic contour. No surrounding inflammatory changes. No main pancreatic ductal dilatation.  Spleen: Normal in size without focal abnormality.  Adrenals/Urinary Tract:  No adrenal nodule bilaterally.  Persistent mild left hydronephrosis and severe left hydroureter with associated urothelial thickening. No nephroureterolithiasis. No hydronephrosis on the right.  Status post radical cystoprostatectomy with lower quadrant ileal conduit formation.  Stomach/Bowel: Stomach is within normal limits. No evidence of bowel wall thickening or dilatation. Colonic diverticulosis. The appendix is not definitely identified with no inflammatory changes in the right lower quadrant to suggest acute appendicitis.  Vascular/Lymphatic: No abdominal aorta or iliac aneurysm. At least moderate atherosclerotic plaque of the aorta and its branches. No abdominal, pelvic, or inguinal lymphadenopathy.  Reproductive: Prostatectomy.  Other: No intraperitoneal free fluid. No intraperitoneal free gas. No organized fluid collection.  Musculoskeletal:  No abdominal wall hernia or abnormality.  Similar-appearing expansile lytic lesion of the right inferior pubic rami. No acute displaced fracture.  IMPRESSION: 1.  Persistent mild left hydronephrosis and severe left hydroureter with associated urothelial thickening in a patient status post radical cystoprostatectomy with lower quadrant ileal conduit formation. This may reflect chronic changes of obstructive uropathy or reflux. Limited evaluation on this noncontrast study. Recommend correlation with urinalysis for infection. 2. Trace to small right and trace left pleural effusions. 3. Colonic diverticulosis with no acute diverticulitis. 4. Similar-appearing expansile lytic lesion of the right inferior pubic rami. Finding likely represents an osseous metastasis. 5.  Aortic Atherosclerosis (ICD10-I70.0).   Electronically Signed By: Tish Frederickson M.D. On: 05/05/2022 23:00   Assessment & Plan:    1. Malignant neoplasm of urinary bladder, unspecified site [facility]) (Primary) Management per [facility]  2. Recurrent UTI Continue hiprex 1g BID  No follow-ups on file.  Wilkie Aye, MD  [facility] [place]

## 2023-10-11 ENCOUNTER — Other Ambulatory Visit: Payer: Self-pay

## 2023-10-12 ENCOUNTER — Encounter: Payer: Self-pay | Admitting: Internal Medicine

## 2023-10-12 ENCOUNTER — Ambulatory Visit: Payer: Medicare Other | Attending: Internal Medicine | Admitting: Internal Medicine

## 2023-10-12 VITALS — BP 131/86 | HR 98 | Ht 71.0 in | Wt 166.0 lb

## 2023-10-12 DIAGNOSIS — I480 Paroxysmal atrial fibrillation: Secondary | ICD-10-CM | POA: Diagnosis not present

## 2023-10-12 NOTE — Progress Notes (Signed)
 HPI [name] returns for ongoing evaluation of atrial fib with a CVR. He is a pleasant 83 yo man with bladder CA who was found to have atrial fib with a CVR. He was asymptomatic. He was started on eliquis and after about a month developed hematuria and was treated for a UTI. His hematuria resolved. He had recurrent hematuria and epistaxis and is now off of her eliquis. He has developed bilateral pleural effusions and is undergoing regular thoracentesis. Unclear if this is due to met CA though a PET scan was reported as negative.  Allergies  Allergen Reactions   Ciprofloxacin Hives   Zofran [Ondansetron] Rash     Current Outpatient Medications  Medication Sig Dispense Refill   acetaminophen (TYLENOL) 500 MG tablet Take 1,000 mg by mouth every 6 (six) hours as needed for mild pain, fever, moderate pain or headache.     betamethasone dipropionate 0.05 % cream Apply topically 2 (two) times daily. 60 g 2   budesonide-formoterol (SYMBICORT) 160-4.5 MCG/ACT inhaler Inhale 2 puffs into the lungs 2 (two) times daily. 1 each 6   chlorpheniramine-HYDROcodone (TUSSIONEX) 10-8 MG/5ML Take 5 mLs by mouth every 12 (twelve) hours as needed (severe cough). 70 mL 0   dextromethorphan-guaiFENesin (MUCINEX DM) 30-600 MG 12hr tablet Take 1 tablet by mouth 2 (two) times daily.     hydrOXYzine (ATARAX) 25 MG tablet Take 1 tablet (25 mg total) by mouth 3 (three) times daily as needed. 90 tablet 0   levothyroxine (SYNTHROID) 75 MCG tablet Take 75 mcg by mouth daily.     megestrol (MEGACE) 400 MG/10ML suspension Take 10 mLs (400 mg total) by mouth 2 (two) times daily. 480 mL 3   methenamine (HIPREX) 1 g tablet Take 1 tablet (1 g total) by mouth 2 (two) times daily. 60 tablet 11   methylphenidate (RITALIN) 10 MG tablet TAKE 1 TAB DAILY IF NEEDED. MAX 3 TABS/DAY. 30 tablet 0   metoprolol tartrate (LOPRESSOR) 25 MG tablet Take 1 tablet (25 mg total) by mouth 2 (two) times daily. 60 tablet 2   pantoprazole  (PROTONIX) 40 MG tablet Take 1 tablet (40 mg total) by mouth 2 (two) times daily. 60 tablet 0   predniSONE (DELTASONE) 10 MG tablet Take 1 tablet (10 mg total) by mouth daily with breakfast. 30 tablet 4   SSD 1 % cream Apply topically 2 (two) times daily.     Vitamin D, Ergocalciferol, (DRISDOL) 1.25 MG (50000 UNIT) CAPS capsule Take 50,000 Units by mouth once a week.     apixaban (ELIQUIS) 5 MG TABS tablet Take 1 tablet (5 mg total) by mouth 2 (two) times daily. (Patient not taking: Reported on 10/12/2023) 28 tablet 0   apixaban (ELIQUIS) 5 MG TABS tablet Take 1 tablet (5 mg total) by mouth 2 (two) times daily. (Patient not taking: Reported on 10/12/2023) 28 tablet 0   Current Facility-Administered Medications  Medication Dose Route Frequency Provider Last Rate Last Admin   omalizumab Geoffry Paradise) prefilled syringe 300 mg  300 mg Subcutaneous Q14 Days Alfonse Spruce, MD   300 mg at 11/24/22 [id number]     Past Medical History:  Diagnosis Date   Bladder cancer [facility])    Bone cancer (HCC)    BPH (benign prostatic hypertrophy)    Dysuria    GERD (gastroesophageal reflux disease)    History of adenomatous polyp of colon    tubular adenoma 06/ 2018   History of bladder cancer 12/2014  urologist-  dr Sherryl Barters--  dx High Grade TCC without stromal invasion s/p TURBT and intravesical BCG tx/  recurrent 07/2015 (pTa) TCC  repear intravesical BCG tx    History of hiatal hernia    History of urethral stricture    Nocturia     ROS:   All systems reviewed and negative except as noted in the HPI.   Past Surgical History:  Procedure Laterality Date   CARDIAC CATHETERIZATION  1997   normal coronary arteries   CARDIOVASCULAR STRESS TEST  12-14-2005   Low risk perfusion study/  very small scar in the inferior septum from mid ventricle to apex,  no ischemia/  mild inferior septal hypokinesis/  ef 58%   CATARACT EXTRACTION W/ INTRAOCULAR LENS  IMPLANT, BILATERAL  2011   COLONOSCOPY  last one 06/ 2018    CYSTOSCOPY WITH BIOPSY N/A 08/12/2015   Procedure: CYSTOSCOPY WITH BIOPSY AND FULGERATION;  Surgeon: Hildred Laser, MD;  Location: [facility];  Service: Urology;  Laterality: N/A;   CYSTOSCOPY WITH INJECTION N/A 06/29/2017   Procedure: CYSTOSCOPY WITH INJECTION OF INDOCYANINE GREEN DYE;  Surgeon: Sebastian Ache, MD;  Location: [facility];  Service: Urology;  Laterality: N/A;   CYSTOSCOPY WITH URETHRAL DILATATION N/A 03/21/2017   Procedure: CYSTOSCOPY;  Surgeon: Hildred Laser, MD;  Location: [facility];  Service: Urology;  Laterality: N/A;   ESOPHAGOGASTRODUODENOSCOPY  12-05-2014   IR IMAGING GUIDED PORT INSERTION  02/07/2018   IR NEPHROSTOMY PLACEMENT LEFT  12/25/2021   IR REMOVAL TUN ACCESS W/ PORT W/O FL MOD SED  04/14/2018   TRANSTHORACIC ECHOCARDIOGRAM  01-31-2008   nomral LVF,  ef 55-65%/  mild pulmonic stenosis without regurg.,  peak transpulomonic valve gradient   TRANSURETHRAL RESECTION OF BLADDER TUMOR N/A 12/31/2014   Procedure: TRANSURETHRAL RESECTION OF BLADDER TUMOR (TURBT);  Surgeon: Su Grand, MD;  Location: [facility];  Service: Urology;  Laterality: N/A;   TRANSURETHRAL RESECTION OF BLADDER TUMOR N/A 03/21/2017   Procedure: POSSIBLE TRANSURETHRAL RESECTION OF BLADDER TUMOR (TURBT);  Surgeon: Hildred Laser, MD;  Location: [facility];  Service: Urology;  Laterality: N/A;     Family History  Problem Relation Age of Onset   Alcoholism Father    Colon cancer Neg Hx    Colon polyps Neg Hx    Diabetes Neg Hx    Kidney disease Neg Hx    Esophageal cancer Neg Hx    Heart disease Neg Hx    Gallbladder disease Neg Hx    Allergic rhinitis Neg Hx    Angioedema Neg Hx    Asthma Neg Hx    Atopy Neg Hx    Eczema Neg Hx    Immunodeficiency Neg Hx    Urticaria Neg Hx      Social History   Socioeconomic History   Marital status: Married    Spouse name: Not on file   Number of children: 1   Years  of education: Not on file   Highest education level: Not on file  Occupational History   Occupation: Retired  Tobacco Use   Smoking status: Former    Current packs/day: 0.00    Average packs/day: 1 pack/day for 34.0 years (34.0 ttl pk-yrs)    Types: Cigarettes    Start date: 12/27/1954    Quit date: 12/26/1988    Years since quitting: 34.8   Smokeless tobacco: Never  Vaping Use   Vaping status: Never Used  Substance and Sexual  Activity   Alcohol use: Yes    Alcohol/week: 6.0 standard drinks of alcohol    Types: 6 Cans of beer per week    Comment: BEER   Drug use: No   Sexual activity: Not Currently  Other Topics Concern   Not on file  Social History Narrative   Not on file   Social Drivers of Health   Financial Resource Strain: Not on file  Food Insecurity: No Food Insecurity (12/20/2022)   Hunger Vital Sign    Worried About Running Out of Food in the Last Year: Never true    Ran Out of Food in the Last Year: Never true  Transportation Needs: No Transportation Needs (12/20/2022)   PRAPARE - Administrator, Civil Service (Medical): No    Lack of Transportation (Non-Medical): No  Physical Activity: Not on file  Stress: Not on file  Social Connections: Unknown (12/28/2021)   Received from [facility]   Social Network    Social Network: Not on file  Intimate Partner Violence: Not At Risk (12/20/2022)   Humiliation, Afraid, Rape, and Kick questionnaire    Fear of Current or Ex-Partner: No    Emotionally Abused: No    Physically Abused: No    Sexually Abused: No     BP 131/86   Pulse 98   Ht 5\' 11"  (1.803 m)   Wt 166 lb (75.3 kg)   SpO2 98%   BMI 23.15 kg/m   Physical Exam:  Chronically ill appearing NAD HEENT: Unremarkable Neck:  No JVD, no thyromegally Lymphatics:  No adenopathy Back:  No CVA tenderness Lungs:  scatted rales and egophony in both bases.  HEART:  Regular rate rhythm, no murmurs, no rubs, no clicks Abd:  soft, positive  bowel sounds, no organomegally, no rebound, no guarding Ext:  2 plus pulses, no edema, no cyanosis, no clubbing Skin:  No rashes no nodules Neuro:  CN II through XII intact, motor grossly intact  DEVICE  Normal device function.  See PaceArt for details.   Assess/Plan:  PAF - on exam he is back in rhythm. I asked him to continue his anti-coag. His rates are controlled. He will remain off of blood thinners due to bleeding.  Coags - his bleeding has stopped. HTN -his bp is well controlled.   Sharlot Gowda Khyle Goodell,MD

## 2023-10-12 NOTE — Patient Instructions (Signed)

## 2023-10-13 ENCOUNTER — Encounter: Payer: Self-pay | Admitting: Urology

## 2023-10-13 NOTE — Patient Instructions (Signed)
 Urinary Tract Infection, Male A urinary tract infection (UTI) is an infection in your urinary tract. The urinary tract is made up of the organs that make, store, and get rid of pee (urine) in your body. These organs include: The kidneys. The ureters. The bladder. The urethra. What are the causes? Most UTIs are caused by germs called bacteria. They may be in or near your genitals. These germs grow and cause swelling in your urinary tract. What increases the risk? You're more likely to get a UTI if: You have a soft tube called a catheter that drains your pee. You can't control when you pee or poop. You have trouble peeing because of: A prostate that's bigger than normal. A kidney stone. A urinary blockage. A nerve condition that affects your bladder. Not getting enough to drink. You're sexually active. You're an older adult. You're also more likely to get a UTI if you have other health problems, such as: Diabetes. A weak immune system. Your immune system is your body's defense system. Sickle cell disease. Injury of the spine. What are the signs or symptoms? Symptoms may include: Needing to pee right away. Peeing small amounts often. Trouble getting the stream started. Pain or burning when you pee. Blood in your pee. Pee that smells bad or odd. Pain in your belly or lower back. You may also: Feel confused. This may be the first symptom in older adults. Vomit. Not feel hungry. Feel tired or easily annoyed. Have a fever or chills. How is this diagnosed? A UTI is diagnosed based on your medical history and an exam. You may also have other tests. These may include: Pee tests. Blood tests. Tests for sexually transmitted infections (STIs). If you've had more than one UTI, you may need to have imaging studies done to find out why you keep getting them. How is this treated? A UTI can be treated by: Taking antibiotics or other medicines. Drinking enough fluid to keep your pee  pale yellow. In rare cases, a UTI can cause a very bad condition called sepsis. Sepsis may be treated in the hospital. Follow these instructions at home: Medicines Take your medicines only as told by your health care provider. If you were given antibiotics, take them as told by your provider. Do not stop taking them even if you start to feel better. General instructions Make sure you: Pee often and fully. Do not hold your pee for a long time. Pee after you have sex. Contact a health care provider if: Your symptoms don't get better after 1-2 days of taking antibiotics. Your symptoms go away and then come back. You have a fever or chills. You vomit or feel like you may vomit. Get help right away if: You have very bad pain in your back or lower belly. You faint. This information is not intended to replace advice given to you by your health care provider. Make sure you discuss any questions you have with your health care provider. Document Revised: 11/05/2022 Document Reviewed: 11/05/2022 Elsevier Patient Education  2024 [employer].

## 2023-10-14 ENCOUNTER — Ambulatory Visit (HOSPITAL_COMMUNITY)
Admission: RE | Admit: 2023-10-14 | Discharge: 2023-10-14 | Disposition: A | Discharge status: Home / Self Care | Payer: Medicare Other | Source: Ambulatory Visit | Attending: Hematology | Admitting: Hematology

## 2023-10-14 ENCOUNTER — Ambulatory Visit (HOSPITAL_COMMUNITY)
Admission: RE | Admit: 2023-10-14 | Discharge: 2023-10-14 | Disposition: A | Discharge status: Home / Self Care | Payer: Medicare Other | Source: Ambulatory Visit | Attending: Student | Admitting: Student

## 2023-10-14 ENCOUNTER — Encounter (HOSPITAL_COMMUNITY): Payer: Self-pay

## 2023-10-14 ENCOUNTER — Other Ambulatory Visit: Payer: Self-pay

## 2023-10-14 DIAGNOSIS — J90 Pleural effusion, not elsewhere classified: Secondary | ICD-10-CM | POA: Insufficient documentation

## 2023-10-14 DIAGNOSIS — C771 Secondary and unspecified malignant neoplasm of intrathoracic lymph nodes: Secondary | ICD-10-CM

## 2023-10-14 DIAGNOSIS — R0602 Shortness of breath: Secondary | ICD-10-CM

## 2023-10-14 DIAGNOSIS — D539 Nutritional anemia, unspecified: Secondary | ICD-10-CM

## 2023-10-14 DIAGNOSIS — J9811 Atelectasis: Secondary | ICD-10-CM | POA: Diagnosis not present

## 2023-10-14 DIAGNOSIS — Z48813 Encounter for surgical aftercare following surgery on the respiratory system: Secondary | ICD-10-CM | POA: Diagnosis not present

## 2023-10-14 DIAGNOSIS — C679 Malignant neoplasm of bladder, unspecified: Secondary | ICD-10-CM

## 2023-10-14 MED ORDER — LIDOCAINE HCL (PF) 2 % IJ SOLN
10.0000 mL | Freq: Once | INTRAMUSCULAR | Status: AC
  Administered 2023-10-14: 10 mL

## 2023-10-14 MED ORDER — LIDOCAINE HCL (PF) 2 % IJ SOLN
INTRAMUSCULAR | Status: AC
Start: 2023-10-14 — End: ?
  Filled 2023-10-14: qty 10

## 2023-10-14 NOTE — Progress Notes (Signed)
 Patient tolerated Right sided Thoracentesis procedure well today and 1.8 Liters of pleural fluid removed. Patient verbalized understanding of discharge instructions and transported via wheelchair to xray at this time for post chest xray with no acute distress noted.

## 2023-10-14 NOTE — Procedures (Signed)
 PROCEDURE SUMMARY:  Successful US guided right thoracentesis. Yielded 1.8 L of clear yellow fluid. Pt tolerated procedure well. No immediate complications.  Specimen not sent for labs. CXR ordered; no post-procedure pneumothorax identified.   EBL < 2 mL  Mickie Kay, NP 10/14/2023 11:13 AM

## 2023-10-17 ENCOUNTER — Inpatient Hospital Stay: Payer: Medicare Other | Admitting: Dietician

## 2023-10-17 ENCOUNTER — Inpatient Hospital Stay (HOSPITAL_BASED_OUTPATIENT_CLINIC_OR_DEPARTMENT_OTHER): Payer: Medicare Other | Admitting: Hematology

## 2023-10-17 ENCOUNTER — Inpatient Hospital Stay: Payer: Medicare Other | Attending: Oncology

## 2023-10-17 ENCOUNTER — Inpatient Hospital Stay: Payer: Medicare Other

## 2023-10-17 VITALS — BP 119/58 | HR 98 | Resp 20

## 2023-10-17 VITALS — BP 104/77 | HR 110 | Temp 98.3°F | Resp 20 | Ht 71.0 in | Wt 162.0 lb

## 2023-10-17 DIAGNOSIS — J90 Pleural effusion, not elsewhere classified: Secondary | ICD-10-CM

## 2023-10-17 DIAGNOSIS — C7951 Secondary malignant neoplasm of bone: Secondary | ICD-10-CM | POA: Insufficient documentation

## 2023-10-17 DIAGNOSIS — C771 Secondary and unspecified malignant neoplasm of intrathoracic lymph nodes: Secondary | ICD-10-CM

## 2023-10-17 DIAGNOSIS — C7801 Secondary malignant neoplasm of right lung: Secondary | ICD-10-CM | POA: Insufficient documentation

## 2023-10-17 DIAGNOSIS — C7802 Secondary malignant neoplasm of left lung: Secondary | ICD-10-CM | POA: Insufficient documentation

## 2023-10-17 DIAGNOSIS — Z79633 Long term (current) use of mitotic inhibitor: Secondary | ICD-10-CM | POA: Diagnosis not present

## 2023-10-17 DIAGNOSIS — R530 Neoplastic (malignant) related fatigue: Secondary | ICD-10-CM | POA: Insufficient documentation

## 2023-10-17 DIAGNOSIS — D539 Nutritional anemia, unspecified: Secondary | ICD-10-CM

## 2023-10-17 DIAGNOSIS — C679 Malignant neoplasm of bladder, unspecified: Secondary | ICD-10-CM

## 2023-10-17 DIAGNOSIS — Z5112 Encounter for antineoplastic immunotherapy: Secondary | ICD-10-CM | POA: Diagnosis not present

## 2023-10-17 DIAGNOSIS — R0602 Shortness of breath: Secondary | ICD-10-CM

## 2023-10-17 LAB — COMPREHENSIVE METABOLIC PANEL
ALT: 11 U/L (ref 0–44)
AST: 17 U/L (ref 15–41)
Albumin: 2.3 g/dL — ABNORMAL LOW (ref 3.5–5.0)
Alkaline Phosphatase: 53 U/L (ref 38–126)
Anion gap: 10 (ref 5–15)
BUN: 21 mg/dL (ref 8–23)
CO2: 18 mmol/L — ABNORMAL LOW (ref 22–32)
Calcium: 8.6 mg/dL — ABNORMAL LOW (ref 8.9–10.3)
Chloride: 107 mmol/L (ref 98–111)
Creatinine, Ser: 1.17 mg/dL (ref 0.61–1.24)
GFR, Estimated: >60 mL/min (ref >60)
Glucose, Bld: 117 mg/dL — ABNORMAL HIGH (ref 70–99)
Potassium: 4.8 mmol/L (ref 3.5–5.1)
Sodium: 135 mmol/L (ref 135–145)
Total Bilirubin: 0.6 mg/dL (ref 0.0–1.2)
Total Protein: 5.2 g/dL — ABNORMAL LOW (ref 6.5–8.1)

## 2023-10-17 LAB — CBC WITH DIFFERENTIAL/PLATELET
Abs Immature Granulocytes: 0.06 10*3/uL (ref 0.00–0.07)
Basophils Absolute: 0.1 10*3/uL (ref 0.0–0.1)
Basophils Relative: 1 %
Eosinophils Absolute: 0.1 10*3/uL (ref 0.0–0.5)
Eosinophils Relative: 1 %
HCT: 47.9 % (ref 39.0–52.0)
Hemoglobin: 15.7 g/dL (ref 13.0–17.0)
Immature Granulocytes: 1 %
Lymphocytes Relative: 5 %
Lymphs Abs: 0.6 10*3/uL — ABNORMAL LOW (ref 0.7–4.0)
MCH: 35.7 pg — ABNORMAL HIGH (ref 26.0–34.0)
MCHC: 32.8 g/dL (ref 30.0–36.0)
MCV: 108.9 fL — ABNORMAL HIGH (ref 80.0–100.0)
Monocytes Absolute: 0.4 10*3/uL (ref 0.1–1.0)
Monocytes Relative: 3 %
Neutro Abs: 12.1 10*3/uL — ABNORMAL HIGH (ref 1.7–7.7)
Neutrophils Relative %: 89 %
Platelets: 247 10*3/uL (ref 150–400)
RBC: 4.4 MIL/uL (ref 4.22–5.81)
RDW: 13.6 % (ref 11.5–15.5)
WBC: 13.3 10*3/uL — ABNORMAL HIGH (ref 4.0–10.5)
nRBC: 0 % (ref 0.0–0.2)

## 2023-10-17 LAB — MAGNESIUM: Magnesium: 2.1 mg/dL (ref 1.7–2.4)

## 2023-10-17 LAB — TSH: TSH: 0.578 u[IU]/mL (ref 0.350–4.500)

## 2023-10-17 MED ORDER — PREDNISONE 10 MG PO TABS: 10.0000 mg | ORAL_TABLET | Freq: Every day | ORAL | 3 refills | Status: AC

## 2023-10-17 MED ORDER — PREDNISONE 10 MG PO TABS: 10.0000 mg | ORAL_TABLET | Freq: Every day | ORAL | 4 refills | Status: DC

## 2023-10-17 MED ORDER — SODIUM CHLORIDE 0.9 % IV SOLN: Freq: Once | INTRAVENOUS | Status: AC

## 2023-10-17 MED ORDER — SODIUM CHLORIDE 0.9 % IV SOLN
200.0000 mg | Freq: Once | INTRAVENOUS | Status: AC
  Administered 2023-10-17: 200 mg via INTRAVENOUS
  Filled 2023-10-17: qty 8

## 2023-10-17 NOTE — Patient Instructions (Signed)
 [facility] [place] - A DEPT OF [facility]  Discharge Instructions: Thank you for choosing [facility] to provide your oncology and hematology care.  If you have a lab appointment with the Cancer Center - please note that after April 8th, 2024, all labs will be drawn in the cancer center.  You do not have to check in or register with the main entrance as you have in the past but will complete your check-in in the cancer center.  Wear comfortable clothing and clothing appropriate for easy access to any Portacath or PICC line.   We strive to give you quality time with your provider. You may need to reschedule your appointment if you arrive late (15 or more minutes).  Arriving late affects you and other patients whose appointments are after yours.  Also, if you miss three or more appointments without notifying the office, you may be dismissed from the clinic at the provider's discretion.      For prescription refill requests, have your pharmacy contact our office and allow 72 hours for refills to be completed.    Today you received the following chemotherapy and/or immunotherapy agents [name]      To help prevent nausea and vomiting after your treatment, we encourage you to take your nausea medication as directed.  BELOW ARE SYMPTOMS THAT SHOULD BE REPORTED IMMEDIATELY: *FEVER GREATER THAN 100.4 F (38 C) OR HIGHER *CHILLS OR SWEATING *NAUSEA AND VOMITING THAT IS NOT CONTROLLED WITH YOUR NAUSEA MEDICATION *UNUSUAL SHORTNESS OF BREATH *UNUSUAL BRUISING OR BLEEDING *URINARY PROBLEMS (pain or burning when urinating, or frequent urination) *BOWEL PROBLEMS (unusual diarrhea, constipation, pain near the anus) TENDERNESS IN MOUTH AND THROAT WITH OR WITHOUT PRESENCE OF ULCERS (sore throat, sores in mouth, or a toothache) UNUSUAL RASH, SWELLING OR PAIN  UNUSUAL VAGINAL DISCHARGE OR ITCHING   Items with * indicate a potential emergency and should be followed up  as soon as possible or go to the Emergency Department if any problems should occur.  Please show the CHEMOTHERAPY ALERT CARD or IMMUNOTHERAPY ALERT CARD at check-in to the Emergency Department and triage nurse.  Should you have questions after your visit or need to cancel or reschedule your appointment, please contact [facility] [place] - A DEPT OF Eligh[facility] Brideg[name] [phone]  and follow the prompts.  Office hours are 8:00 a.m. to 4:30 p.m. Monday - Friday. Please note that voicemails left after 4:00 p.m. may not be returned until the following business day.  We are closed weekends and major holidays. You have access to a nurse at all times for urgent questions. Please call the main number to the clinic [phone] and follow the prompts.  For any non-urgent questions, you may also contact your provider using MyChart. We now offer e-Visits for anyone 10 and older to request care online for non-urgent symptoms. For details visit [email or web address].   Also download the MyChart app! Go to the app store, search "MyChart", open the app, select [facility], and log in with your MyChart username and password.

## 2023-10-17 NOTE — Progress Notes (Signed)
 Patient tolerated chemotherapy with no complaints voiced.  Side effects with management reviewed with understanding verbalized.  IV site clean and dry with no bruising or swelling noted at site.  Good blood return noted before and after administration of chemotherapy.  Band aid applied.  Patient left in satisfactory condition with VSS and no s/s of distress noted. All follow ups as scheduled. Pt escorted via wheelchair to be driven home by wife.   [name] [employer]

## 2023-10-17 NOTE — Patient Instructions (Addendum)
[facility] Discharge Instructions   You were seen and examined today by Dr. Katragadda.  He reviewed the results of your lab work which are normal/stable.   He reviewed the results of your CT scan which is stable.   We will proceed with your treatment today.   Return as scheduled.    Thank you for choosing [facility] at [facility] to provide your oncology and hematology care.  To afford each patient quality time with our provider, please arrive at least 15 minutes before your scheduled appointment time.   If you have a lab appointment with the Cancer Center please come in thru the Main Entrance and check in at the main information desk.  You need to re-schedule your appointment should you arrive 10 or more minutes late.  We strive to give you quality time with our providers, and arriving late affects you and other patients whose appointments are after yours.  Also, if you no show three or more times for appointments you may be dismissed from the clinic at the providers discretion.     Again, thank you for choosing [facility].  Our hope is that these requests will decrease the amount of time that you wait before being seen by our physicians.       _____________________________________________________________  Should you have questions after your visit to [facility], please contact our office at [phone] and follow the prompts.  Our office hours are 8:00 a.m. and 4:30 p.m. Monday - Friday.  Please note that voicemails left after 4:00 p.m. may not be returned until the following business day.  We are closed weekends and major holidays.  You do have access to a nurse 24-7, just call the main number to the clinic [phone] and do not press any options, hold on the line and a nurse will answer the phone.    For prescription refill requests, have your pharmacy contact our office and allow 72 hours.     Due to Covid, you will need to wear a mask upon entering the hospital. If you do not have a mask, a mask will be given to you at the Main Entrance upon arrival. For doctor visits, patients may have 1 support person age 18 or older with them. For treatment visits, patients can not have anyone with them due to social distancing guidelines and our immunocompromised population.      

## 2023-10-17 NOTE — Progress Notes (Signed)
 [facility] [address] [place] [address]    Clinic Day:  10/17/2023  Referring physician: Elfredia Nevins, MD  Patient Care Team: Elfredia Nevins, MD as PCP - General (Internal Medicine) Marinus Maw, MD as PCP - Electrophysiology (Cardiology) Doreatha Massed, MD as Medical Oncologist (Medical Oncology) Therese Sarah, RN as Oncology Nurse Navigator (Medical Oncology)   ASSESSMENT & PLAN:   Assessment: 1.  Metastatic urothelial carcinoma of the bladder to the lungs and bones: - Neoadjuvant chemotherapy with gemcitabine and cisplatin cycle 1 on 05/02/2017 - Cystoprostatectomy, bilateral lymphadenectomy by Dr. Berneice Heinrich on 06/29/2017, pathology T3b N0, 0/13 lymph nodes - Developed metastatic disease in April 2019, biopsy of the right ischium on 12/08/2017 with poorly differentiated carcinoma consistent with metastatic urothelial carcinoma. - XRT to the pelvis completed on 01/16/2018 - Carboplatin and gemcitabine 6 cycles from 01/30/2018 through 05/22/2018 - Pembrolizumab every 3 weeks started on 06/21/2018, discontinued in April 2020 due to skin toxicity - XRT to the right paratracheal lymph node, 50 Gray in 10 fractions completed on 04/23/2020 - XRT to the intrathoracic lymph node completed on 11/27/2021  - 12/20/2022: Right thoracentesis with 1.8 L removed - 12/21/2022: Left thoracentesis with 700 mL fluid removed, cytology negative. - Unclear etiology of recurrent effusions, exudative. - 2D echocardiogram: Normal LVEF with grade 1 diastolic dysfunction. - CT angiogram 12/20/2022: Negative for PE.  Multifocal parenchymal opacities.  No adenopathy. - Keytruda started on 01/07/2023.   2.  Social/family history: - Lives at home with his wife. - Worked for [employer].  Quit smoking 40 years ago. - No family history of malignancies.    Plan: 1.  Metastatic urothelial carcinoma of the bladder to the lungs and bones: - PET scan on 06/23/2023: No evidence  of recurrence in the chest, abdomen and pelvis.  No evidence of skeletal metastasis. - Reviewed CT CAP (10/07/2023): Small bilateral pleural effusions.  Loculated fluid at the right lung apex.  No new developing masses or lymphadenopathy.  Stable destructive lesion involving the right inferior pubic ramus. - Labs today: Normal LFTs.  Albumin low at 2.3.  CBC grossly normal.  TSH is 0.57. - Continue treatment today and in 3 weeks.  RTC 6 weeks for follow-up.   2.  Recurrent right pleural effusion: - He had right thoracentesis on 10/04/2023 (1.8 L), 10/07/2023 (1.6 L), 09/30/2023 (1.9 L), 09/16/2023 (2.3 L).  He had left thoracentesis on 10/03/2023 (950 mL). - He is requiring right thoracentesis more frequently. - We discussed the role of Pleurx catheter.  He would like to think about it and let us know.   3.  Loss of appetite/weight loss: - Continue Megace 400 mg twice daily.  He is eating 1 good meal per day and drinks 1 Ensure per day.   4.  Immunotherapy induced skin rash: - Continue prednisone 10 mg daily.  He takes it when he gets rash and itching along with hydroxyzine.   5.  Cancer-related fatigue: - Continue Ritalin 10 mg in the morning as needed.  He is not requiring on regular basis.    Orders Placed This Encounter  Procedures   US THORACENTESIS ASP PLEURAL SPACE W/IMG GUIDE    Standing Status:   Future    Expiration Date:   10/16/2024    Are labs required for specimen collection?:   No    Reason for Exam (SYMPTOM  OR DIAGNOSIS REQUIRED):   pleural effusion    Preferred imaging location?:  [facility]   US THORACENTESIS ASP PLEURAL SPACE W/IMG GUIDE    Standing Status:   Future    Expiration Date:   10/16/2024    Are labs required for specimen collection?:   No    Reason for Exam (SYMPTOM  OR DIAGNOSIS REQUIRED):   pleural effusion    Preferred imaging location?:   [facility]   US THORACENTESIS ASP PLEURAL SPACE W/IMG GUIDE    Standing Status:   Future     Expiration Date:   10/16/2024    Are labs required for specimen collection?:   No    Reason for Exam (SYMPTOM  OR DIAGNOSIS REQUIRED):   pleural effusion    Preferred imaging location?:   [facility]   US THORACENTESIS ASP PLEURAL SPACE W/IMG GUIDE    Standing Status:   Future    Expiration Date:   10/16/2024    Are labs required for specimen collection?:   No    Reason for Exam (SYMPTOM  OR DIAGNOSIS REQUIRED):   pleural effusion    Preferred imaging location?:   [facility]   [place] THORACENTESIS ASP PLEURAL SPACE W/IMG GUIDE    Standing Status:   Future    Expiration Date:   10/16/2024    Are labs required for specimen collection?:   No    Reason for Exam (SYMPTOM  OR DIAGNOSIS REQUIRED):   pleural effusion    Preferred imaging location?:   [facility]   US THORACENTESIS ASP PLEURAL SPACE W/IMG GUIDE    Standing Status:   Future    Expiration Date:   10/16/2024    Are labs required for specimen collection?:   No    Reason for Exam (SYMPTOM  OR DIAGNOSIS REQUIRED):   pleural effusion    Preferred imaging location?:   [facility]   US THORACENTESIS ASP PLEURAL SPACE W/IMG GUIDE    Standing Status:   Future    Expiration Date:   10/16/2024    Are labs required for specimen collection?:   No    Reason for Exam (SYMPTOM  OR DIAGNOSIS REQUIRED):   pleural effusion    Preferred imaging location?:   [facility]   US THORACENTESIS ASP PLEURAL SPACE W/IMG GUIDE    Standing Status:   Future    Expiration Date:   10/16/2024    Are labs required for specimen collection?:   No    Reason for Exam (SYMPTOM  OR DIAGNOSIS REQUIRED):   pleural effusion    Preferred imaging location?:   [facility]   US THORACENTESIS ASP PLEURAL SPACE W/IMG GUIDE    Standing Status:   Future    Expiration Date:   10/16/2024    Are labs required for specimen collection?:   No    Reason for Exam (SYMPTOM  OR DIAGNOSIS REQUIRED):   pleural effusion    Preferred imaging location?:    [facility]   US THORACENTESIS ASP PLEURAL SPACE W/IMG GUIDE    Standing Status:   Future    Expiration Date:   10/16/2024    Are labs required for specimen collection?:   No    Reason for Exam (SYMPTOM  OR DIAGNOSIS REQUIRED):   pleural effusion    Preferred imaging location?:   [facility]   US THORACENTESIS ASP PLEURAL SPACE W/IMG GUIDE    Standing Status:   Future    Expiration Date:   10/16/2024  Are labs required for specimen collection?:   No    Reason for Exam (SYMPTOM  OR DIAGNOSIS REQUIRED):   pleural effusion    Preferred imaging location?:   [facility]   US THORACENTESIS ASP PLEURAL SPACE W/IMG GUIDE    Standing Status:   Future    Expiration Date:   10/16/2024    Are labs required for specimen collection?:   No    Reason for Exam (SYMPTOM  OR DIAGNOSIS REQUIRED):   pleural effusion    Preferred imaging location?:   [facility]   Magnesium    Standing Status:   Future    Expected Date:   11/22/2023    Expiration Date:   11/21/2024   CBC with Differential    Standing Status:   Future    Expected Date:   11/22/2023    Expiration Date:   11/21/2024   Comprehensive metabolic panel    Standing Status:   Future    Expected Date:   11/22/2023    Expiration Date:   11/21/2024   Magnesium    Standing Status:   Future    Expected Date:   12/13/2023    Expiration Date:   12/12/2024   CBC with Differential    Standing Status:   Future    Expected Date:   12/13/2023    Expiration Date:   12/12/2024   Comprehensive metabolic panel    Standing Status:   Future    Expected Date:   12/13/2023    Expiration Date:   12/12/2024   Magnesium    Standing Status:   Future    Expected Date:   01/03/2024    Expiration Date:   01/02/2025   CBC with Differential    Standing Status:   Future    Expected Date:   01/03/2024    Expiration Date:   01/02/2025   Comprehensive metabolic panel    Standing Status:   Future    Expected Date:   01/03/2024    Expiration Date:    01/02/2025   T4    Standing Status:   Future    Expected Date:   01/03/2024    Expiration Date:   01/02/2025   TSH    Standing Status:   Future    Expected Date:   01/03/2024    Expiration Date:   01/02/2025   Magnesium    Standing Status:   Future    Expected Date:   01/24/2024    Expiration Date:   01/23/2025   CBC with Differential    Standing Status:   Future    Expected Date:   01/24/2024    Expiration Date:   01/23/2025   Comprehensive metabolic panel    Standing Status:   Future    Expected Date:   01/24/2024    Expiration Date:   01/23/2025   T4    Standing Status:   Future    Expected Date:   01/24/2024    Expiration Date:   01/23/2025   TSH    Standing Status:   Future    Expected Date:   01/24/2024    Expiration Date:   01/23/2025   Magnesium    Standing Status:   Future    Expected Date:   02/14/2024    Expiration Date:   02/13/2025   CBC with Differential    Standing Status:   Future    Expected Date:   02/14/2024    Expiration Date:  02/13/2025   Comprehensive metabolic panel    Standing Status:   Future    Expected Date:   02/14/2024    Expiration Date:   02/13/2025   T4    Standing Status:   Future    Expected Date:   02/14/2024    Expiration Date:   02/13/2025   TSH    Standing Status:   Future    Expected Date:   02/14/2024    Expiration Date:   02/13/2025      Mikeal Hawthorne R Teague,acting as a scribe for Doreatha Massed, MD.,have documented all relevant documentation on the behalf of Doreatha Massed, MD,as directed by  Doreatha Massed, MD while in the presence of Doreatha Massed, MD.  I, Doreatha Massed MD, have reviewed the above documentation for accuracy and completeness, and I agree with the above.      Doreatha Massed, MD   3/3/20252:48 PM  CHIEF COMPLAINT:   Diagnosis: Metastatic urothelial carcinoma of the bladder    Cancer Staging  No matching staging information was found for the patient.    Prior Therapy: 1. Neoadjuvant  gemcitabine/cisplatin on 05/02/17 (one cycle) 2. Cystoprostatectomy and bilateral lymphadenectomy on 06/29/2017 (Dr. Berneice Heinrich) 3. Radiation therapy to the pelvic lesion 01/02/18 - 01/16/18 4. Carboplatin and gemcitabine, 6 cycles 02/06/18 - 05/22/18 5. Pembrolizumab 06/21/18 - 12/13/18 6. Radiation therapy to RUL and right paratracheal nodes 04/09/20 - 04/23/20 7. Radiation therapy to intrathoracic lymph nodes 11/16/21 - 11/27/21  Current Therapy:  pembrolizumab    HISTORY OF PRESENT ILLNESS:   Oncology History  Stage IV malignant neoplasm of urinary bladder /with mets to the bones and lungs  04/20/2017 Initial Diagnosis   Malignant neoplasm of urinary bladder (HCC)   01/30/2018 - 05/22/2018 Chemotherapy   The patient had palonosetron (ALOXI) injection 0.25 mg, 0.25 mg, Intravenous,  Once, 6 of 6 cycles Administration: 0.25 mg (01/30/2018), 0.25 mg (02/20/2018), 0.25 mg (03/13/2018), 0.25 mg (04/03/2018), 0.25 mg (05/01/2018), 0.25 mg (05/22/2018) CARBOplatin (PARAPLATIN) 400 mg in sodium chloride 0.9 % 250 mL chemo infusion, 400 mg (100 % of original dose 400 mg), Intravenous,  Once, 6 of 6 cycles Dose modification: 400 mg (original dose 400 mg, Cycle 1),   (original dose 400 mg, Cycle 2, Reason: Patient Age), 410 mg (original dose 400 mg, Cycle 6) Administration: 400 mg (01/30/2018), 400 mg (02/20/2018), 400 mg (03/13/2018), 410 mg (04/03/2018), 410 mg (05/01/2018), 410 mg (05/22/2018) gemcitabine (GEMZAR) 2,128 mg in sodium chloride 0.9 % 250 mL chemo infusion, 1,000 mg/m2 = 2,128 mg, Intravenous,  Once, 6 of 6 cycles Dose modification: 800 mg/m2 (original dose 1,000 mg/m2, Cycle 4, Reason: Provider Judgment), 800 mg/m2 (original dose 1,000 mg/m2, Cycle 4, Reason: Provider Judgment) Administration: 2,128 mg (01/30/2018), 2,128 mg (02/06/2018), 2,128 mg (02/20/2018), 2,128 mg (02/27/2018), 2,128 mg (03/13/2018), 1,710 mg (04/03/2018), 1,710 mg (04/10/2018), 1,710 mg (05/01/2018), 1,710 mg (05/22/2018) fosaprepitant (EMEND) 150 mg,  dexamethasone (DECADRON) 12 mg in sodium chloride 0.9 % 145 mL IVPB, , Intravenous,  Once, 6 of 6 cycles Administration:  (01/30/2018),  (02/20/2018),  (03/13/2018),  (04/03/2018),  (05/01/2018),  (05/22/2018)  for chemotherapy treatment.    01/07/2023 -  Chemotherapy   Patient is on Treatment Plan : BLADDER Pembrolizumab (200) q21d     Bladder cancer (HCC)  06/28/2017 Initial Diagnosis   Bladder cancer (HCC)   06/21/2018 - 12/13/2018 Chemotherapy   The patient had pembrolizumab (KEYTRUDA) 200 mg in sodium chloride 0.9 % 50 mL chemo infusion, 200 mg, Intravenous, Once, 8 of 9  cycles Administration: 200 mg (06/21/2018), 200 mg (07/12/2018), 200 mg (08/02/2018), 200 mg (08/23/2018), 200 mg (09/13/2018), 200 mg (10/04/2018), 200 mg (11/22/2018), 200 mg (12/13/2018)  for chemotherapy treatment.       INTERVAL HISTORY:   Vint is a 83 y.o. male presenting to clinic today for follow up of Metastatic urothelial carcinoma of the bladder. He was last seen by me on 09/01/23.  Since his last visit, he underwent CT C/A/P on 10/07/23 that found: New small bilateral pleural effusions identified. Some are on the left to the prior PET-CT. Right-sided effusion is smaller. Persistent adjacent opacity. There is some loculated fluid at the right lung apex as well with persistent nodular opacity in the right upper lobe extending back to the hilum with some distortion. Stable bronchiectasis with nodular contour to the bronchi in the medial aspect of lingula. No developing new mass lesion, lymph node enlargement. Gallstones. Surgical changes of cystoprostatectomy with ileal loop conduit. Stable destructive lesion involving the right inferior pubic ramus.  Today, he states that he is doing well overall. His appetite level is at 25%. His energy level is at 25%.   PAST MEDICAL HISTORY:  0 Past Medical History: Past Medical History:  Diagnosis Date   Bladder cancer (HCC)    Bone cancer (HCC)    BPH (benign prostatic  hypertrophy)    Dysuria    GERD (gastroesophageal reflux disease)    History of adenomatous polyp of colon    tubular adenoma 06/ 2018   History of bladder cancer 12/2014   urologist-  dr Sherryl Barters--  dx High Grade TCC without stromal invasion s/p TURBT and intravesical BCG tx/  recurrent 07/2015 (pTa) TCC  repear intravesical BCG tx    History of hiatal hernia    History of urethral stricture    Nocturia     Surgical History: Past Surgical History:  Procedure Laterality Date   CARDIAC CATHETERIZATION  1997   normal coronary arteries   CARDIOVASCULAR STRESS TEST  12-14-2005   Low risk perfusion study/  very small scar in the inferior septum from mid ventricle to apex,  no ischemia/  mild inferior septal hypokinesis/  ef 58%   CATARACT EXTRACTION W/ INTRAOCULAR LENS  IMPLANT, BILATERAL  2011   COLONOSCOPY  last one 06/ 2018   CYSTOSCOPY WITH BIOPSY N/A 08/12/2015   Procedure: CYSTOSCOPY WITH BIOPSY AND FULGERATION;  Surgeon: Hildred Laser, MD;  Location: [facility];  Service: Urology;  Laterality: N/A;   CYSTOSCOPY WITH INJECTION N/A 06/29/2017   Procedure: CYSTOSCOPY WITH INJECTION OF INDOCYANINE GREEN DYE;  Surgeon: Sebastian Ache, MD;  Location: [facility];  Service: Urology;  Laterality: N/A;   CYSTOSCOPY WITH URETHRAL DILATATION N/A 03/21/2017   Procedure: CYSTOSCOPY;  Surgeon: Hildred Laser, MD;  Location: [facility];  Service: Urology;  Laterality: N/A;   ESOPHAGOGASTRODUODENOSCOPY  12-05-2014   IR IMAGING GUIDED PORT INSERTION  02/07/2018   IR NEPHROSTOMY PLACEMENT LEFT  12/25/2021   IR REMOVAL TUN ACCESS W/ PORT W/O FL MOD SED  04/14/2018   TRANSTHORACIC ECHOCARDIOGRAM  01-31-2008   nomral LVF,  ef 55-65%/  mild pulmonic stenosis without regurg.,  peak transpulomonic valve gradient   TRANSURETHRAL RESECTION OF BLADDER TUMOR N/A 12/31/2014   Procedure: TRANSURETHRAL RESECTION OF BLADDER TUMOR (TURBT);  Surgeon: Su Grand, MD;  Location:  [facility];  Service: Urology;  Laterality: N/A;   TRANSURETHRAL RESECTION OF BLADDER TUMOR N/A 03/21/2017   Procedure: POSSIBLE TRANSURETHRAL RESECTION OF  BLADDER TUMOR (TURBT);  Surgeon: Hildred Laser, MD;  Location: [facility];  Service: Urology;  Laterality: N/A;    Social History: Social History   Socioeconomic History   Marital status: Married    Spouse name: Not on file   Number of children: 1   Years of education: Not on file   Highest education level: Not on file  Occupational History   Occupation: Retired  Tobacco Use   Smoking status: Former    Current packs/day: 0.00    Average packs/day: 1 pack/day for 34.0 years (34.0 ttl pk-yrs)    Types: Cigarettes    Start date: 12/27/1954    Quit date: 12/26/1988    Years since quitting: 34.8   Smokeless tobacco: Never  Vaping Use   Vaping status: Never Used  Substance and Sexual Activity   Alcohol use: Yes    Alcohol/week: 6.0 standard drinks of alcohol    Types: 6 Cans of beer per week    Comment: BEER   Drug use: No   Sexual activity: Not Currently  Other Topics Concern   Not on file  Social History Narrative   Not on file   Social Drivers of Health   Financial Resource Strain: Not on file  Food Insecurity: No Food Insecurity (12/20/2022)   Hunger Vital Sign    Worried About Running Out of Food in the Last Year: Never true    Ran Out of Food in the Last Year: Never true  Transportation Needs: No Transportation Needs (12/20/2022)   PRAPARE - Administrator, Civil Service (Medical): No    Lack of Transportation (Non-Medical): No  Physical Activity: Not on file  Stress: Not on file  Social Connections: Unknown (12/28/2021)   Received from [facility]   Social Network    Social Network: Not on file  Intimate Partner Violence: Not At Risk (12/20/2022)   Humiliation, Afraid, Rape, and Kick questionnaire    Fear of Current or Ex-Partner: No    Emotionally  Abused: No    Physically Abused: No    Sexually Abused: No    Family History: Family History  Problem Relation Age of Onset   Alcoholism Father    Colon cancer Neg Hx    Colon polyps Neg Hx    Diabetes Neg Hx    Kidney disease Neg Hx    Esophageal cancer Neg Hx    Heart disease Neg Hx    Gallbladder disease Neg Hx    Allergic rhinitis Neg Hx    Angioedema Neg Hx    Asthma Neg Hx    Atopy Neg Hx    Eczema Neg Hx    Immunodeficiency Neg Hx    Urticaria Neg Hx     Current Medications:  Current Outpatient Medications:    acetaminophen (TYLENOL) 500 MG tablet, Take 1,000 mg by mouth every 6 (six) hours as needed for mild pain, fever, moderate pain or headache., Disp: , Rfl:    apixaban (ELIQUIS) 5 MG TABS tablet, Take 1 tablet (5 mg total) by mouth 2 (two) times daily., Disp: 28 tablet, Rfl: 0   apixaban (ELIQUIS) 5 MG TABS tablet, Take 1 tablet (5 mg total) by mouth 2 (two) times daily., Disp: 28 tablet, Rfl: 0   betamethasone dipropionate 0.05 % cream, Apply topically 2 (two) times daily., Disp: 60 g, Rfl: 2   budesonide-formoterol (SYMBICORT) 160-4.5 MCG/ACT inhaler, Inhale 2 puffs into the lungs 2 (two) times daily., Disp: 1  each, Rfl: 6   chlorpheniramine-HYDROcodone (TUSSIONEX) 10-8 MG/5ML, Take 5 mLs by mouth every 12 (twelve) hours as needed (severe cough)., Disp: 70 mL, Rfl: 0   dextromethorphan-guaiFENesin (MUCINEX DM) 30-600 MG 12hr tablet, Take 1 tablet by mouth 2 (two) times daily., Disp: , Rfl:    hydrOXYzine (ATARAX) 25 MG tablet, Take 1 tablet (25 mg total) by mouth 3 (three) times daily as needed., Disp: 90 tablet, Rfl: 0   levothyroxine (SYNTHROID) 75 MCG tablet, Take 75 mcg by mouth daily., Disp: , Rfl:    megestrol (MEGACE) 400 MG/10ML suspension, Take 10 mLs (400 mg total) by mouth 2 (two) times daily., Disp: 480 mL, Rfl: 3   methenamine (HIPREX) 1 g tablet, Take 1 tablet (1 g total) by mouth 2 (two) times daily., Disp: 60 tablet, Rfl: 11   methylphenidate  (RITALIN) 10 MG tablet, TAKE 1 TAB DAILY IF NEEDED. MAX 3 TABS/DAY., Disp: 30 tablet, Rfl: 0   metoprolol tartrate (LOPRESSOR) 25 MG tablet, Take 1 tablet (25 mg total) by mouth 2 (two) times daily., Disp: 60 tablet, Rfl: 2   pantoprazole (PROTONIX) 40 MG tablet, Take 1 tablet (40 mg total) by mouth 2 (two) times daily., Disp: 60 tablet, Rfl: 0   SSD 1 % cream, Apply topically 2 (two) times daily., Disp: , Rfl:    Vitamin D, Ergocalciferol, (DRISDOL) 1.25 MG (50000 UNIT) CAPS capsule, Take 50,000 Units by mouth once a week., Disp: , Rfl:    predniSONE (DELTASONE) 10 MG tablet, Take 1 tablet (10 mg total) by mouth daily with breakfast., Disp: 90 tablet, Rfl: 3  Current Facility-Administered Medications:    omalizumab Geoffry Paradise) prefilled syringe 300 mg, 300 mg, Subcutaneous, Q14 Days, Alfonse Spruce, MD, 300 mg at 11/24/22 1610  Facility-Administered Medications Ordered in Other Visits:    pembrolizumab (KEYTRUDA) 200 mg in sodium chloride 0.9 % 50 mL chemo infusion, 200 mg, Intravenous, Once, Doreatha Massed, MD   Allergies: Allergies  Allergen Reactions   Ciprofloxacin Hives   Zofran [Ondansetron] Rash    REVIEW OF SYSTEMS:   Review of Systems  Constitutional:  Negative for chills, fatigue and fever.  HENT:   Negative for lump/mass, mouth sores, nosebleeds, sore throat and trouble swallowing.   Eyes:  Negative for eye problems.  Respiratory:  Positive for cough and shortness of breath.   Cardiovascular:  Negative for chest pain, leg swelling and palpitations.  Gastrointestinal:  Negative for abdominal pain, constipation, diarrhea, nausea and vomiting.  Genitourinary:  Negative for bladder incontinence, difficulty urinating, dysuria, frequency, hematuria and nocturia.   Musculoskeletal:  Negative for arthralgias, back pain, flank pain, myalgias and neck pain.  Skin:  Negative for itching and rash.  Neurological:  Negative for dizziness, headaches and numbness.   Hematological:  Does not bruise/bleed easily.  Psychiatric/Behavioral:  Negative for depression, sleep disturbance and suicidal ideas. The patient is not nervous/anxious.   All other systems reviewed and are negative.    VITALS:   Blood pressure 104/77, pulse (!) 110, temperature 98.3 F (36.8 C), temperature source Tympanic, resp. rate 20, height 5\' 11"  (1.803 m), weight 162 lb (73.5 kg), SpO2 96%.  Wt Readings from Last 3 Encounters:  10/17/23 162 lb (73.5 kg)  10/12/23 166 lb (75.3 kg)  09/01/23 168 lb 6.9 oz (76.4 kg)    Body mass index is 22.59 kg/m.  Performance status (ECOG): 1 - Symptomatic but completely ambulatory  PHYSICAL EXAM:   Physical Exam Vitals and nursing note reviewed. Exam conducted with a  chaperone present.  Constitutional:      Appearance: Normal appearance.  Cardiovascular:     Rate and Rhythm: Normal rate and regular rhythm.     Pulses: Normal pulses.     Heart sounds: Normal heart sounds.  Pulmonary:     Effort: Pulmonary effort is normal.     Breath sounds: Normal breath sounds.  Abdominal:     Palpations: Abdomen is soft. There is no hepatomegaly, splenomegaly or mass.     Tenderness: There is no abdominal tenderness.  Musculoskeletal:     Right lower leg: No edema.     Left lower leg: No edema.  Lymphadenopathy:     Cervical: No cervical adenopathy.     Right cervical: No superficial, deep or posterior cervical adenopathy.    Left cervical: No superficial, deep or posterior cervical adenopathy.     Upper Body:     Right upper body: No supraclavicular or axillary adenopathy.     Left upper body: No supraclavicular or axillary adenopathy.  Neurological:     General: No focal deficit present.     Mental Status: He is alert and oriented to person, place, and time.  Psychiatric:        Mood and Affect: Mood normal.        Behavior: Behavior normal.     LABS:      Latest Ref Rng & Units 10/17/2023   12:42 PM 09/22/2023   12:26 PM  09/01/2023   12:49 PM  CBC  WBC 4.0 - 10.5 K/uL 13.3  9.8  9.6   Hemoglobin 13.0 - 17.0 g/dL 16.1  09.6  04.5   Hematocrit 39.0 - 52.0 % 47.9  44.5  47.1   Platelets 150 - 400 K/uL 247  195  176       Latest Ref Rng & Units 10/17/2023   12:42 PM 09/22/2023   12:26 PM 09/01/2023   12:49 PM  CMP  Glucose 70 - 99 mg/dL 409  90  811   BUN 8 - 23 mg/dL 21  15  20    Creatinine 0.61 - 1.24 mg/dL 9.14  7.82  9.56   Sodium 135 - 145 mmol/L 135  139  139   Potassium 3.5 - 5.1 mmol/L 4.8  3.8  3.7   Chloride 98 - 111 mmol/L 107  108  109   CO2 22 - 32 mmol/L 18  23  22    Calcium 8.9 - 10.3 mg/dL 8.6  8.4  8.7   Total Protein 6.5 - 8.1 g/dL 5.2  5.1  5.5   Total Bilirubin 0.0 - 1.2 mg/dL 0.6  0.8  0.9   Alkaline Phos 38 - 126 U/L 53  43  57   AST 15 - 41 U/L 17  15  13    ALT 0 - 44 U/L 11  11  14       No results found for: "CEA1", "CEA" / No results found for: "CEA1", "CEA" No results found for: "PSA1" No results found for: "OZH086" No results found for: "CAN125"  No results found for: "TOTALPROTELP", "ALBUMINELP", "A1GS", "A2GS", "BETS", "BETA2SER", "GAMS", "MSPIKE", "SPEI" Lab Results  Component Value Date   TIBC 249 03/20/2018   FERRITIN 759 (H) 03/20/2018   IRONPCTSAT 20 (L) 03/20/2018   Lab Results  Component Value Date   LDH 96 (L) 06/21/2022     STUDIES:   CT CHEST ABDOMEN PELVIS W CONTRAST Result Date: 10/14/2023 CLINICAL DATA:  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 Thoracentesis. * Tracking Code: BO * EXAM: CT CHEST, ABDOMEN, AND PELVIS WITH CONTRAST TECHNIQUE: Multidetector CT imaging of the chest, abdomen and pelvis was performed following the standard protocol during bolus administration of intravenous contrast. RADIATION DOSE REDUCTION: This exam was performed according to the departmental dose-optimization program which includes automated exposure control, adjustment of the mA and/or kV according to patient size and/or use of iterative reconstruction  technique. CONTRAST:  OMNIPAQUE IOHEXOL 300 MG/ML  SOLN COMPARISON:  PET-CT scan 06/23/2023.  Standard CTA 12/20/2022. FINDINGS: CT CHEST FINDINGS Cardiovascular: Heart is nonenlarged. Trace pericardial fluid. Coronary calcifications are seen. The thoracic aorta is normal course and caliber with partially calcified atherosclerotic plaque. Calcifications are also seen along the aortic valve. Mediastinum/Nodes: Slight wall thickening along the patulous esophagus, nonspecific. Small thyroid gland. No specific abnormal lymph node enlargement identified in the axillary regions, hilum or mediastinum. Only a few small less than 1 cm size nodes are seen in the mediastinum, not pathologic by size criteria. Example anteriorly on series 2, image 34. Lungs/Pleura: Small bilateral pleural effusions are seen, left slightly greater than right. Actually smaller in size on the right compared to the study of November 2024. There is more opacity at the right lower lobe adjacent to the effusion. Atelectasis versus infiltrate would be in the differential. Small nodular area at the left lung apex is again identified. Previously measuring 11 x 7 mm and today 10 by 6 mm on series 3, image 41. There is also focal area bronchiectasis seen in the lingula medial and anterior with some irregular contour to the bronchi in this location with ground-glass. Areas similar to the PET-CT scan. Please correlate with history. There is debris along the trachea extending into the right central bronchi. Persistent nodular opacity at the right lung apex medially as well as an area of loculated pleural fluid at the right lung apex. This opacity extends back to the hilum and there are areas of bronchial narrowing. This is similar to the prior PET-CT. This area was not hypermetabolic above blood pool. Musculoskeletal: Gynecomastia. 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 Osteopenia. Healed right-sided rib fractures. CT  ABDOMEN PELVIS FINDINGS Hepatobiliary: Stones in the gallbladder. Multiple benign-appearing hepatic cystic lesions. Patent portal vein. Pancreas: Few calcifications seen along the pancreas, nonspecific. No obvious pancreatic mass. Spleen: Normal in size without focal abnormality. Adrenals/Urinary Tract: Adrenal glands are preserved. There is mild bilateral renal atrophy. No enhancing mass. Extrarenal pelvis noted to the left kidney. Otherwise no collecting system dilatation. Prominent bilateral renal sinus fat. The ureters have normal course and caliber down to the right lower quadrant ileal loop conduit. Previous cystectomy. Prostate is also surgically absent. Stomach/Bowel: Oral contrast was administered. The large bowel has a normal course and caliber scattered stool. Few left-sided colonic diverticula. Distended stomach with contrast. Small bowel is nondilated. Vascular/Lymphatic: Scattered vascular calcifications. Noncalcified plaque as well along the normal caliber aorta and branch vessels. Normal caliber IVC. No discrete abnormal lymph node enlargement identified in the abdomen and pelvis. Reproductive: Evidence of prior cystoprostatectomy. Small amount of fluid in the surgical bed on the right side, similar previous. Other: Anasarca. No free intra-air. No developing free fluid in the abdomen and pelvis. Musculoskeletal: Degenerative changes of the spine and pelvis. Destructive lesion involving the right inferior pubic ramus is again identified and is similar in appearance to previous. IMPRESSION: New small bilateral pleural effusions identified. Some are on the left to the prior PET-CT. Right-sided effusion is smaller. Persistent  adjacent opacity. There is some loculated fluid at the right lung apex as well with persistent nodular opacity in the right upper lobe extending back to the hilum with some distortion. Please correlate with recent thoracentesis. Stable bronchiectasis with nodular contour to the  bronchi in the medial aspect of lingula. No developing new mass lesion, lymph node enlargement. Gallstones. Surgical changes of cystoprostatectomy with ileal loop conduit. Stable destructive lesion involving the right inferior pubic ramus. Electronically Signed   By: Karen Kays M.D.   On: 10/14/2023 14:26   US THORACENTESIS ASP PLEURAL SPACE W/IMG GUIDE Result Date: 10/14/2023 INDICATION: Patient with a history of metastatic bladder cancer with recurrent bilateral pleural effusions. Interventional radiology asked to perform a therapeutic thoracentesis. EXAM: ULTRASOUND GUIDED THORACENTESIS MEDICATIONS: 1% lidocaine 10 mL COMPLICATIONS: None immediate. PROCEDURE: An ultrasound guided thoracentesis was thoroughly discussed with the patient and questions answered. The benefits, risks, alternatives and complications were also discussed. The patient understands and wishes to proceed with the procedure. Written consent was obtained. Ultrasound was performed to localize and mark an adequate pocket of fluid in the right chest. The area was then prepped and draped in the normal sterile fashion. 1% Lidocaine was used for local anesthesia. Under ultrasound guidance a 6 Fr Safe-T-Centesis catheter was introduced. Thoracentesis was performed. The catheter was removed and a dressing applied. FINDINGS: A total of approximately 1.8 L of clear yellow fluid was removed. IMPRESSION: Successful ultrasound guided right thoracentesis yielding 1.8 L of pleural fluid. Procedure performed by Alwyn Ren NP Electronically Signed   By: Marliss Coots M.D.   On: 10/14/2023 12:57   DG Chest 1 View Result Date: 10/14/2023 CLINICAL DATA:  Status post right-sided thoracentesis EXAM: CHEST  1 VIEW COMPARISON:  CT scan of the chest, abdomen and pelvis 10/07/2023 FINDINGS: No evidence of pneumothorax or other complication. Diminished volume of right-sided pleural effusion. Persistent right lower lobe atelectasis. Small left-sided pleural  effusion persists. No focal airspace infiltrate or pulmonary edema. No acute osseous abnormality. IMPRESSION: 1. Decreased right-sided pleural effusion. 2. No evidence of pneumothorax or other complication. 3. Persistent right lower lobe atelectasis/collapse. 4. Small left pleural effusion persists. Electronically Signed   By: Malachy Moan M.D.   On: 10/14/2023 11:03   US THORACENTESIS ASP PLEURAL SPACE W/IMG GUIDE Result Date: 10/07/2023 INDICATION: Patient with history of metastatic bladder cancer, recurrent pleural effusions. Request for therapeutic right thoracentesis. EXAM: ULTRASOUND GUIDED RIGHT THORACENTESIS MEDICATIONS: 6 mL 1% lidocaine COMPLICATIONS: None immediate. PROCEDURE: An ultrasound guided thoracentesis was thoroughly discussed with the patient and questions answered. The benefits, risks, alternatives and complications were also discussed. The patient understands and wishes to proceed with the procedure. Written consent was obtained. Ultrasound was performed to localize and mark an adequate pocket of fluid in the right chest. The area was then prepped and draped in the normal sterile fashion. 1% Lidocaine was used for local anesthesia. Under ultrasound guidance a 6 Fr Safe-T-Centesis catheter was introduced. Thoracentesis was performed. The catheter was removed and a dressing applied. FINDINGS: A total of approximately 1.6 L of clear yellow fluid was removed. IMPRESSION: Successful ultrasound guided right thoracentesis yielding 1.6 L of pleural fluid. Performed by Lynnette Caffey, PA-C Electronically Signed   By: Marliss Coots M.D.   On: 10/07/2023 12:36   DG Chest 1 View Result Date: 10/07/2023 CLINICAL DATA:  Status post right thoracentesis EXAM: CHEST  1 VIEW COMPARISON:  Chest radiograph dated 10/03/2023 FINDINGS: Patient is rotated to the right. Asymmetrically low right lung volumes.  Dense right basilar opacity. Moderate right pleural effusion. Trace left pleural effusion. No  pneumothorax. Right heart border is obscured. No acute osseous abnormality. IMPRESSION: 1. Moderate right pleural effusion with dense right basilar opacity, likely atelectasis. No pneumothorax. 2. Trace left pleural effusion. Electronically Signed   By: Agustin Cree M.D.   On: 10/07/2023 10:56   US THORACENTESIS ASP PLEURAL SPACE W/IMG GUIDE Result Date: 10/03/2023 INDICATION: Patient is a 83 y/o male with history of metastatic bladder cancer with recurrent pleural effusions. Patient presents for a therapeutic left sided thoracentesis due to a left pleural effusion. EXAM: ULTRASOUND GUIDED LEFT THORACENTESIS MEDICATIONS: 7 mL lidocaine 1 % COMPLICATIONS: None immediate. PROCEDURE: An ultrasound guided thoracentesis was thoroughly discussed with the patient and questions answered. The benefits, risks, alternatives and complications were also discussed. The patient understands and wishes to proceed with the procedure. Written consent was obtained. Ultrasound was performed to localize and mark an adequate pocket of fluid in the left chest. The area was then prepped and draped in the normal sterile fashion. 1% Lidocaine was used for local anesthesia. Under ultrasound guidance a 6 Fr Safe-T-Centesis catheter was introduced. Thoracentesis was performed. The catheter was removed and a dressing applied. FINDINGS: A total of approximately 950 mL of straw colored fluid was removed. IMPRESSION: Successful ultrasound guided left thoracentesis yielding 950 mL of pleural fluid. Performed by Philipp Ovens PA-C Electronically Signed   By: Marliss Coots M.D.   On: 10/03/2023 12:42   DG Chest 1 View Result Date: 10/03/2023 CLINICAL DATA:  [id number] Status post thoracentesis [id number]. Post LEFT thoracentesis. EXAM: PORTABLE CHEST 1 VIEW COMPARISON:  IR thoracentesis, earlier same day. CT chest, 12/20/2022. Chest XR, 09/30/2023. FINDINGS: Cardiomediastinal silhouette is within normal limits. Well inflated LEFT chest. No LEFT  pneumothorax or residual pleural effusion. Relative improved aeration of the RIGHT chest, with incomplete inflation of the RIGHT lower lobe and pneumothorax ex vacuo. Atelectatic collapse of the middle lobe. Small volume RIGHT pleural effusion. No interval osseous abnormality. IMPRESSION: 1. No pneumothorax or residual pleural effusion post LEFT thoracentesis. 2. Improving aeration of the RIGHT chest, with middle lobe atelectatic collapse and incomplete inflation of the RIGHT lower lobe with pneumothorax ex vacuo. Electronically Signed   By: Roanna Banning M.D.   On: 10/03/2023 11:12   DG Chest Portable 1 View Result Date: 09/30/2023 CLINICAL DATA:  post right thoracentesis EXAM: PORTABLE CHEST 1 VIEW COMPARISON:  IR thoracentesis, earlier same day. Chest XR, 09/16/2023 and 09/02/2023. CT chest, 12/20/2022. FINDINGS: Cardiomediastinal silhouette is within normal limits given technique and degree of patient rotation. The LEFT chest is well inflated. Small volume residual RIGHT pleural effusion without pneumothorax. Patchy RIGHT basilar opacity. No acute osseous abnormality. IMPRESSION: 1. Small volume residual RIGHT pleural effusion without pneumothorax post RIGHT thoracentesis. 2. Patchy RIGHT basilar opacity is likely to represent atelectasis, however superimposed pneumonia can appear similar. Electronically Signed   By: Roanna Banning M.D.   On: 09/30/2023 13:08   US THORACENTESIS ASP PLEURAL SPACE W/IMG GUIDE Result Date: 09/30/2023 INDICATION: Patient with history of metastatic bladder cancer, recurrent pleural effusion. Request for therapeutic thoracentesis. EXAM: ULTRASOUND GUIDED RIGHT THORACENTESIS MEDICATIONS: 6 mL 1% lidocaine COMPLICATIONS: None immediate. PROCEDURE: An ultrasound guided thoracentesis was thoroughly discussed with the patient and questions answered. The benefits, risks, alternatives and complications were also discussed. The patient understands and wishes to proceed with the procedure.  Written consent was obtained. Ultrasound was performed to localize and mark an adequate pocket of fluid in the right  chest. The area was then prepped and draped in the normal sterile fashion. 1% Lidocaine was used for local anesthesia. Under ultrasound guidance a 6 Fr Safe-T-Centesis catheter was introduced. Thoracentesis was performed. The catheter was removed and a dressing applied. FINDINGS: A total of approximately 1.9 L of clear yellow fluid was removed. IMPRESSION: Successful ultrasound guided therapeutic RIGHT thoracentesis yielding 1.9 L of pleural fluid. Performed by Lynnette Caffey, PA-C Electronically Signed   By: Roanna Banning M.D.   On: 09/30/2023 13:03

## 2023-10-19 LAB — T4: T4, Total: 11.3 ug/dL (ref 4.5–12.0)

## 2023-10-21 ENCOUNTER — Encounter (HOSPITAL_COMMUNITY): Payer: Self-pay

## 2023-10-21 ENCOUNTER — Ambulatory Visit (HOSPITAL_COMMUNITY)
Admission: RE | Admit: 2023-10-21 | Discharge: 2023-10-21 | Disposition: A | Discharge status: Home / Self Care | Source: Ambulatory Visit | Attending: Radiology | Admitting: Radiology

## 2023-10-21 ENCOUNTER — Ambulatory Visit (HOSPITAL_COMMUNITY)
Admission: RE | Admit: 2023-10-21 | Discharge: 2023-10-21 | Disposition: A | Discharge status: Home / Self Care | Source: Ambulatory Visit | Attending: Hematology | Admitting: Hematology

## 2023-10-21 DIAGNOSIS — C679 Malignant neoplasm of bladder, unspecified: Secondary | ICD-10-CM | POA: Diagnosis not present

## 2023-10-21 DIAGNOSIS — J90 Pleural effusion, not elsewhere classified: Secondary | ICD-10-CM | POA: Insufficient documentation

## 2023-10-21 MED ORDER — LIDOCAINE HCL (PF) 2 % IJ SOLN
10.0000 mL | Freq: Once | INTRAMUSCULAR | Status: AC
  Administered 2023-10-21: 10 mL

## 2023-10-21 NOTE — Progress Notes (Signed)
 Patient tolerated left sided Thoracentesis procedure well today and 1.2 Liters of clear yellow pleural fluid removed. Patient verbalized understanding of discharge instructions and transported via wheelchair to xray at this time for post chest xray with no acute distress noted.

## 2023-10-28 ENCOUNTER — Ambulatory Visit (HOSPITAL_COMMUNITY)
Admission: RE | Admit: 2023-10-28 | Discharge: 2023-10-28 | Disposition: A | Discharge status: Home / Self Care | Source: Ambulatory Visit | Attending: Radiology | Admitting: Radiology

## 2023-10-28 ENCOUNTER — Ambulatory Visit (HOSPITAL_COMMUNITY)
Admission: RE | Admit: 2023-10-28 | Discharge: 2023-10-28 | Disposition: A | Discharge status: Home / Self Care | Source: Ambulatory Visit | Attending: Hematology | Admitting: Hematology

## 2023-10-28 ENCOUNTER — Encounter (HOSPITAL_COMMUNITY): Payer: Self-pay

## 2023-10-28 DIAGNOSIS — J984 Other disorders of lung: Secondary | ICD-10-CM | POA: Diagnosis not present

## 2023-10-28 DIAGNOSIS — J90 Pleural effusion, not elsewhere classified: Secondary | ICD-10-CM | POA: Insufficient documentation

## 2023-10-28 DIAGNOSIS — Z48813 Encounter for surgical aftercare following surgery on the respiratory system: Secondary | ICD-10-CM | POA: Diagnosis not present

## 2023-10-28 MED ORDER — LIDOCAINE HCL (PF) 2 % IJ SOLN
10.0000 mL | Freq: Once | INTRAMUSCULAR | Status: AC
Start: 2023-10-28 — End: 2023-10-28
  Administered 2023-10-28: 10 mL

## 2023-10-28 MED ORDER — LIDOCAINE HCL (PF) 2 % IJ SOLN
INTRAMUSCULAR | Status: AC
  Filled 2023-10-28: qty 10

## 2023-10-28 NOTE — Progress Notes (Signed)
 Patient tolerated right sided Thoracentesis procedure well today and 1.5 Liters of clear yellow pleural fluid removed. Patient verbalized understanding of discharge instructions and transported via wheelchair at this time for post chest xray with no acute distress noted.

## 2023-10-28 NOTE — Procedures (Signed)
 Ultrasound-guided therapeutic right sided thoracentesis performed yielding 1.5 liters of straw colored fluid. No immediate complications.   Follow-up chest x-ray pending. EBL is < 2 ml.

## 2023-11-04 ENCOUNTER — Ambulatory Visit (HOSPITAL_COMMUNITY)
Admission: RE | Admit: 2023-11-04 | Discharge: 2023-11-04 | Disposition: A | Discharge status: Home / Self Care | Source: Ambulatory Visit | Attending: Radiology | Admitting: Radiology

## 2023-11-04 ENCOUNTER — Ambulatory Visit (HOSPITAL_COMMUNITY)
Admission: RE | Admit: 2023-11-04 | Discharge: 2023-11-04 | Disposition: A | Discharge status: Home / Self Care | Source: Ambulatory Visit | Attending: Hematology | Admitting: Hematology

## 2023-11-04 ENCOUNTER — Encounter (HOSPITAL_COMMUNITY): Payer: Self-pay

## 2023-11-04 DIAGNOSIS — J90 Pleural effusion, not elsewhere classified: Secondary | ICD-10-CM | POA: Diagnosis not present

## 2023-11-04 MED ORDER — LIDOCAINE HCL (PF) 2 % IJ SOLN
INTRAMUSCULAR | Status: AC
  Filled 2023-11-04: qty 20

## 2023-11-04 MED ORDER — LIDOCAINE HCL (PF) 2 % IJ SOLN
10.0000 mL | Freq: Once | INTRAMUSCULAR | Status: AC
  Administered 2023-11-04: 10 mL via INTRADERMAL

## 2023-11-04 NOTE — Progress Notes (Addendum)
 Pt had Right Thoracentesis completed. 1.9 liters removed. Pt tolerated well. VSS. Chest xray completed at 1044.  Pt wheeled out with wife via wheelchair after chest xray read by radiologist

## 2023-11-04 NOTE — Procedures (Signed)
 Ultrasound-guided therapeutic right sided  thoracentesis performed yielding 1.9 liters of straw colored fluid. No immediate complications Follow-up chest x-ray pending. EBL is < 2 ml.

## 2023-11-07 ENCOUNTER — Inpatient Hospital Stay: Payer: Medicare Other

## 2023-11-07 ENCOUNTER — Inpatient Hospital Stay: Payer: Medicare Other | Admitting: Dietician

## 2023-11-07 VITALS — BP 113/68 | HR 88 | Temp 97.6°F | Resp 18 | Wt 166.0 lb

## 2023-11-07 DIAGNOSIS — R530 Neoplastic (malignant) related fatigue: Secondary | ICD-10-CM | POA: Diagnosis not present

## 2023-11-07 DIAGNOSIS — C7951 Secondary malignant neoplasm of bone: Secondary | ICD-10-CM | POA: Diagnosis not present

## 2023-11-07 DIAGNOSIS — Z5112 Encounter for antineoplastic immunotherapy: Secondary | ICD-10-CM | POA: Diagnosis not present

## 2023-11-07 DIAGNOSIS — C679 Malignant neoplasm of bladder, unspecified: Secondary | ICD-10-CM

## 2023-11-07 DIAGNOSIS — C7802 Secondary malignant neoplasm of left lung: Secondary | ICD-10-CM | POA: Diagnosis not present

## 2023-11-07 DIAGNOSIS — C7801 Secondary malignant neoplasm of right lung: Secondary | ICD-10-CM | POA: Diagnosis not present

## 2023-11-07 LAB — COMPREHENSIVE METABOLIC PANEL
ALT: 25 U/L (ref 0–44)
AST: 22 U/L (ref 15–41)
Albumin: 2.6 g/dL — ABNORMAL LOW (ref 3.5–5.0)
Alkaline Phosphatase: 41 U/L (ref 38–126)
Anion gap: 9 (ref 5–15)
BUN: 21 mg/dL (ref 8–23)
CO2: 25 mmol/L (ref 22–32)
Calcium: 8.8 mg/dL — ABNORMAL LOW (ref 8.9–10.3)
Chloride: 106 mmol/L (ref 98–111)
Creatinine, Ser: 0.89 mg/dL (ref 0.61–1.24)
GFR, Estimated: >60 mL/min (ref >60)
Glucose, Bld: 111 mg/dL — ABNORMAL HIGH (ref 70–99)
Potassium: 4.5 mmol/L (ref 3.5–5.1)
Sodium: 140 mmol/L (ref 135–145)
Total Bilirubin: 0.4 mg/dL (ref 0.0–1.2)
Total Protein: 5.6 g/dL — ABNORMAL LOW (ref 6.5–8.1)

## 2023-11-07 LAB — CBC WITH DIFFERENTIAL/PLATELET
Abs Immature Granulocytes: 0.08 10*3/uL — ABNORMAL HIGH (ref 0.00–0.07)
Basophils Absolute: 0.1 10*3/uL (ref 0.0–0.1)
Basophils Relative: 1 %
Eosinophils Absolute: 0.1 10*3/uL (ref 0.0–0.5)
Eosinophils Relative: 1 %
HCT: 48.6 % (ref 39.0–52.0)
Hemoglobin: 15.6 g/dL (ref 13.0–17.0)
Immature Granulocytes: 1 %
Lymphocytes Relative: 5 %
Lymphs Abs: 0.6 10*3/uL — ABNORMAL LOW (ref 0.7–4.0)
MCH: 35.9 pg — ABNORMAL HIGH (ref 26.0–34.0)
MCHC: 32.1 g/dL (ref 30.0–36.0)
MCV: 111.7 fL — ABNORMAL HIGH (ref 80.0–100.0)
Monocytes Absolute: 0.5 10*3/uL (ref 0.1–1.0)
Monocytes Relative: 4 %
Neutro Abs: 10.8 10*3/uL — ABNORMAL HIGH (ref 1.7–7.7)
Neutrophils Relative %: 88 %
Platelets: 242 10*3/uL (ref 150–400)
RBC: 4.35 MIL/uL (ref 4.22–5.81)
RDW: 13.9 % (ref 11.5–15.5)
WBC: 12.2 10*3/uL — ABNORMAL HIGH (ref 4.0–10.5)
nRBC: 0 % (ref 0.0–0.2)

## 2023-11-07 LAB — TSH: TSH: 1.041 u[IU]/mL (ref 0.350–4.500)

## 2023-11-07 LAB — MAGNESIUM: Magnesium: 2.3 mg/dL (ref 1.7–2.4)

## 2023-11-07 MED ORDER — SODIUM CHLORIDE 0.9 % IV SOLN: Freq: Once | INTRAVENOUS | Status: AC

## 2023-11-07 MED ORDER — SODIUM CHLORIDE 0.9 % IV SOLN
200.0000 mg | Freq: Once | INTRAVENOUS | Status: AC
  Administered 2023-11-07: 200 mg via INTRAVENOUS
  Filled 2023-11-07: qty 8

## 2023-11-07 NOTE — Patient Instructions (Signed)
 [facility] - A DEPT OF [facility]  Discharge Instructions: Thank you for choosing [facility] to provide your oncology and hematology care.  If you have a lab appointment with the Cancer Center - please note that after April 8th, 2024, all labs will be drawn in the cancer center.  You do not have to check in or register with the main entrance as you have in the past but will complete your check-in in the cancer center.  Wear comfortable clothing and clothing appropriate for easy access to any Portacath or PICC line.   We strive to give you quality time with your provider. You may need to reschedule your appointment if you arrive late (15 or more minutes).  Arriving late affects you and other patients whose appointments are after yours.  Also, if you miss three or more appointments without notifying the office, you may be dismissed from the clinic at the provider's discretion.      For prescription refill requests, have your pharmacy contact our office and allow 72 hours for refills to be completed.    Today you received the following chemotherapy and/or immunotherapy agents pembrulizumab     To help prevent nausea and vomiting after your treatment, we encourage you to take your nausea medication as directed.  BELOW ARE SYMPTOMS THAT SHOULD BE REPORTED IMMEDIATELY: *FEVER GREATER THAN 100.4 F (38 C) OR HIGHER *CHILLS OR SWEATING *NAUSEA AND VOMITING THAT IS NOT CONTROLLED WITH YOUR NAUSEA MEDICATION *UNUSUAL SHORTNESS OF BREATH *UNUSUAL BRUISING OR BLEEDING *URINARY PROBLEMS (pain or burning when urinating, or frequent urination) *BOWEL PROBLEMS (unusual diarrhea, constipation, pain near the anus) TENDERNESS IN MOUTH AND THROAT WITH OR WITHOUT PRESENCE OF ULCERS (sore throat, sores in mouth, or a toothache) UNUSUAL RASH, SWELLING OR PAIN  UNUSUAL VAGINAL DISCHARGE OR ITCHING   Items with * indicate a potential emergency and should be followed  up as soon as possible or go to the Emergency Department if any problems should occur.  Please show the CHEMOTHERAPY ALERT CARD or IMMUNOTHERAPY ALERT CARD at check-in to the Emergency Department and triage nurse.  Should you have questions after your visit or need to cancel or reschedule your appointment, please contact [facility]  - A DEPT OF [facility] [phone]  and follow the prompts.  Office hours are 8:00 a.m. to 4:30 p.m. Monday - Friday. Please note that voicemails left after 4:00 p.m. may not be returned until the following business day.  We are closed weekends and major holidays. You have access to a nurse at all times for urgent questions. Please call the main number to the clinic [phone] and follow the prompts.  For any non-urgent questions, you may also contact your provider using MyChart. We now offer e-Visits for anyone 49 and older to request care online for non-urgent symptoms. For details visit [email or web address].   Also download the MyChart app! Go to the app store, search "MyChart", open the app, select , and log in with your MyChart username and password.

## 2023-11-07 NOTE — Progress Notes (Signed)
 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 He is receiving [place] q21d.   Therapeutic thoracentesis: 3/21 - 1.9 L on right 3/14 - 1.5 L on right 3/07 - 1.2 L on left  2/28 - 1.8 L on right 2/21 - 1.6 L on right  Met with pt and wife in infusion. Pt eating peanut butter crackers at visit. Wife reports pt mostly "nik-naks" (chicken drumstick, oatmeal cookies, biscuit/gravy, ice cream). Likes to drink pepsi and V8 juice. Pt drinks water with medications. He stopped taking megace. Pt agreeable to increasing Boost 2x/day.   Medications: reviewed   Labs: albumin 2.6  Anthropometrics: Wt 166 lb today increased (fluid status affecting actual wt)  3/3 - 162 lb 2/26 - 166 lb 1/16 - 168 lb 6.9 oz    NUTRITION DIAGNOSIS: Unintended wt loss - likely ongoing   INTERVENTION:  Continue strategies for increasing calories and protein with small meals/snacks  Encourage high calorie high protein foods in soft moist textures  Provided samples of [place] protein powder for pt to try     MONITORING, EVALUATION, GOAL: wt trends, intake   NEXT VISIT: To be scheduled

## 2023-11-07 NOTE — Progress Notes (Signed)
 Labs reviewed, no new issues. Ok to treat per Guidelines.  Treatment given per orders. Patient tolerated it well without problems. Vitals stable and discharged home from clinic via wheelchair. Follow up as scheduled.

## 2023-11-08 LAB — T4: T4, Total: 9.3 ug/dL (ref 4.5–12.0)

## 2023-11-11 ENCOUNTER — Encounter (HOSPITAL_COMMUNITY): Payer: Self-pay

## 2023-11-11 ENCOUNTER — Ambulatory Visit (HOSPITAL_COMMUNITY)
Admission: RE | Admit: 2023-11-11 | Discharge: 2023-11-11 | Disposition: A | Discharge status: Home / Self Care | Source: Ambulatory Visit | Attending: Hematology | Admitting: Hematology

## 2023-11-11 ENCOUNTER — Ambulatory Visit (HOSPITAL_COMMUNITY)
Admission: RE | Admit: 2023-11-11 | Discharge: 2023-11-11 | Disposition: A | Discharge status: Home / Self Care | Source: Ambulatory Visit | Attending: Student | Admitting: Student

## 2023-11-11 DIAGNOSIS — Z48813 Encounter for surgical aftercare following surgery on the respiratory system: Secondary | ICD-10-CM | POA: Diagnosis not present

## 2023-11-11 DIAGNOSIS — J40 Bronchitis, not specified as acute or chronic: Secondary | ICD-10-CM | POA: Diagnosis not present

## 2023-11-11 DIAGNOSIS — J90 Pleural effusion, not elsewhere classified: Secondary | ICD-10-CM | POA: Insufficient documentation

## 2023-11-11 MED ORDER — LIDOCAINE HCL (PF) 2 % IJ SOLN
INTRAMUSCULAR | Status: AC
  Filled 2023-11-11: qty 10

## 2023-11-11 MED ORDER — LIDOCAINE HCL (PF) 2 % IJ SOLN
10.0000 mL | Freq: Once | INTRAMUSCULAR | Status: AC
  Administered 2023-11-11: 10 mL

## 2023-11-11 NOTE — Progress Notes (Signed)
 Patient tolerated Left sided Thoracentesis procedure well today and 1 Liters of clear yellow fluid removed. Patient verbalized understanding of discharge instructions and transported via wheelchair at this time for post chest xray with no acute distress noted.

## 2023-11-11 NOTE — Procedures (Signed)
 PROCEDURE SUMMARY:  Successful US guided left thoracentesis. Yielded 1 L of clear yellow+ fluid. Pt tolerated procedure well. No immediate complications.  Specimen not sent for labs. CXR ordered; no post-procedure pneumothorax identified.   EBL < 2 mL  Mickie Kay, NP 11/11/2023 11:15 AM

## 2023-11-15 ENCOUNTER — Other Ambulatory Visit: Payer: Self-pay | Admitting: Hematology

## 2023-11-21 ENCOUNTER — Encounter (HOSPITAL_COMMUNITY): Payer: Self-pay

## 2023-11-21 ENCOUNTER — Ambulatory Visit (HOSPITAL_COMMUNITY)
Admission: RE | Admit: 2023-11-21 | Discharge: 2023-11-21 | Disposition: A | Discharge status: Home / Self Care | Source: Ambulatory Visit | Attending: Physician Assistant | Admitting: Physician Assistant

## 2023-11-21 ENCOUNTER — Ambulatory Visit (HOSPITAL_COMMUNITY)
Admission: RE | Admit: 2023-11-21 | Discharge: 2023-11-21 | Disposition: A | Discharge status: Home / Self Care | Source: Ambulatory Visit | Attending: Hematology | Admitting: Hematology

## 2023-11-21 DIAGNOSIS — J90 Pleural effusion, not elsewhere classified: Secondary | ICD-10-CM | POA: Diagnosis not present

## 2023-11-21 DIAGNOSIS — R918 Other nonspecific abnormal finding of lung field: Secondary | ICD-10-CM | POA: Diagnosis not present

## 2023-11-21 DIAGNOSIS — C679 Malignant neoplasm of bladder, unspecified: Secondary | ICD-10-CM | POA: Diagnosis not present

## 2023-11-21 DIAGNOSIS — C799 Secondary malignant neoplasm of unspecified site: Secondary | ICD-10-CM | POA: Insufficient documentation

## 2023-11-21 DIAGNOSIS — J984 Other disorders of lung: Secondary | ICD-10-CM | POA: Diagnosis not present

## 2023-11-21 MED ORDER — LIDOCAINE HCL (PF) 2 % IJ SOLN
10.0000 mL | Freq: Once | INTRAMUSCULAR | Status: AC
  Administered 2023-11-21: 10 mL

## 2023-11-21 NOTE — Procedures (Addendum)
 PROCEDURE SUMMARY:  Successful image-guided therapeutic thoracentesis from the right chest.  Yielded 1.4 liters of yellow fluid.  No immediate complications.  EBL: < 2 mL Patient tolerated well.   Post-procedure chest x-ray ordered and completed.   Please see imaging section of Epic for full dictation.  Lesha Jager NP 11/21/2023 11:31 AM

## 2023-11-21 NOTE — Progress Notes (Signed)
 Patient tolerated right sided Thoracentesis procedure well today and 1.4 Liters of pleural fluid removed. Patient verbalized understanding of discharge instructions and transported via wheelchair to xray at this time for post chest xray with no acute distress noted.

## 2023-11-29 ENCOUNTER — Inpatient Hospital Stay (HOSPITAL_BASED_OUTPATIENT_CLINIC_OR_DEPARTMENT_OTHER): Admitting: Hematology

## 2023-11-29 ENCOUNTER — Inpatient Hospital Stay: Attending: Oncology

## 2023-11-29 ENCOUNTER — Inpatient Hospital Stay

## 2023-11-29 VITALS — BP 115/69 | HR 80 | Temp 97.7°F | Resp 18 | Wt 166.0 lb

## 2023-11-29 DIAGNOSIS — C679 Malignant neoplasm of bladder, unspecified: Secondary | ICD-10-CM

## 2023-11-29 DIAGNOSIS — C7800 Secondary malignant neoplasm of unspecified lung: Secondary | ICD-10-CM | POA: Insufficient documentation

## 2023-11-29 DIAGNOSIS — Z5112 Encounter for antineoplastic immunotherapy: Secondary | ICD-10-CM | POA: Insufficient documentation

## 2023-11-29 DIAGNOSIS — C771 Secondary and unspecified malignant neoplasm of intrathoracic lymph nodes: Secondary | ICD-10-CM

## 2023-11-29 DIAGNOSIS — C7951 Secondary malignant neoplasm of bone: Secondary | ICD-10-CM | POA: Diagnosis not present

## 2023-11-29 DIAGNOSIS — J90 Pleural effusion, not elsewhere classified: Secondary | ICD-10-CM

## 2023-11-29 DIAGNOSIS — Z79620 Long term (current) use of immunosuppressive biologic: Secondary | ICD-10-CM | POA: Insufficient documentation

## 2023-11-29 LAB — COMPREHENSIVE METABOLIC PANEL WITH GFR
ALT: 17 U/L (ref 0–44)
AST: 25 U/L (ref 15–41)
Albumin: 2.8 g/dL — ABNORMAL LOW (ref 3.5–5.0)
Alkaline Phosphatase: 54 U/L (ref 38–126)
Anion gap: 12 (ref 5–15)
BUN: 22 mg/dL (ref 8–23)
CO2: 25 mmol/L (ref 22–32)
Calcium: 9 mg/dL (ref 8.9–10.3)
Chloride: 103 mmol/L (ref 98–111)
Creatinine, Ser: 0.93 mg/dL (ref 0.61–1.24)
GFR, Estimated: >60 mL/min (ref >60)
Glucose, Bld: 116 mg/dL — ABNORMAL HIGH (ref 70–99)
Potassium: 3.9 mmol/L (ref 3.5–5.1)
Sodium: 140 mmol/L (ref 135–145)
Total Bilirubin: 0.5 mg/dL (ref 0.0–1.2)
Total Protein: 6 g/dL — ABNORMAL LOW (ref 6.5–8.1)

## 2023-11-29 LAB — CBC WITH DIFFERENTIAL/PLATELET
Abs Immature Granulocytes: 0.06 10*3/uL (ref 0.00–0.07)
Basophils Absolute: 0.1 10*3/uL (ref 0.0–0.1)
Basophils Relative: 1 %
Eosinophils Absolute: 0.2 10*3/uL (ref 0.0–0.5)
Eosinophils Relative: 2 %
HCT: 48.6 % (ref 39.0–52.0)
Hemoglobin: 15.5 g/dL (ref 13.0–17.0)
Immature Granulocytes: 1 %
Lymphocytes Relative: 7 %
Lymphs Abs: 0.7 10*3/uL (ref 0.7–4.0)
MCH: 36.3 pg — ABNORMAL HIGH (ref 26.0–34.0)
MCHC: 31.9 g/dL (ref 30.0–36.0)
MCV: 113.8 fL — ABNORMAL HIGH (ref 80.0–100.0)
Monocytes Absolute: 0.4 10*3/uL (ref 0.1–1.0)
Monocytes Relative: 4 %
Neutro Abs: 9 10*3/uL — ABNORMAL HIGH (ref 1.7–7.7)
Neutrophils Relative %: 85 %
Platelets: 303 10*3/uL (ref 150–400)
RBC: 4.27 MIL/uL (ref 4.22–5.81)
RDW: 13.6 % (ref 11.5–15.5)
WBC: 10.5 10*3/uL (ref 4.0–10.5)
nRBC: 0 % (ref 0.0–0.2)

## 2023-11-29 LAB — MAGNESIUM: Magnesium: 2.3 mg/dL (ref 1.7–2.4)

## 2023-11-29 MED ORDER — SODIUM CHLORIDE 0.9 % IV SOLN
200.0000 mg | Freq: Once | INTRAVENOUS | Status: AC
  Administered 2023-11-29: 200 mg via INTRAVENOUS
  Filled 2023-11-29: qty 8

## 2023-11-29 MED ORDER — SODIUM CHLORIDE 0.9 % IV SOLN: Freq: Once | INTRAVENOUS | Status: AC

## 2023-11-29 NOTE — Progress Notes (Signed)
 Patient presents today for Keytruda infusion. Patient is in satisfactory condition with no new complaints voiced.  Vital signs are stable.  Labs reviewed by Dr. Ellin Saba during the office visit and all labs are within treatment parameters.  We will proceed with treatment per MD orders.

## 2023-11-29 NOTE — Progress Notes (Signed)
 [facility] [address], [place] [address]    Clinic Day:  11/29/2023  Referring physician: Elfredia Nevins, MD  Patient Care Team: Elfredia Nevins, MD as PCP - General (Internal Medicine) Marinus Maw, MD as PCP - Electrophysiology (Cardiology) Doreatha Massed, MD as Medical Oncologist (Medical Oncology) Therese Sarah, RN as Oncology Nurse Navigator (Medical Oncology)   ASSESSMENT & PLAN:   Assessment: 1.  Metastatic urothelial carcinoma of the bladder to the lungs and bones: - Neoadjuvant chemotherapy with gemcitabine and cisplatin cycle 1 on 05/02/2017 - Cystoprostatectomy, bilateral lymphadenectomy by Dr. Berneice Heinrich on 06/29/2017, pathology T3b N0, 0/13 lymph nodes - Developed metastatic disease in April 2019, biopsy of the right ischium on 12/08/2017 with poorly differentiated carcinoma consistent with metastatic urothelial carcinoma. - XRT to the pelvis completed on 01/16/2018 - Carboplatin and gemcitabine 6 cycles from 01/30/2018 through 05/22/2018 - Pembrolizumab every 3 weeks started on 06/21/2018, discontinued in April 2020 due to skin toxicity - XRT to the right paratracheal lymph node, 50 Gray in 10 fractions completed on 04/23/2020 - XRT to the intrathoracic lymph node completed on 11/27/2021  - 12/20/2022: Right thoracentesis with 1.8 L removed - 12/21/2022: Left thoracentesis with 700 mL fluid removed, cytology negative. - Unclear etiology of recurrent effusions, exudative. - 2D echocardiogram: Normal LVEF with grade 1 diastolic dysfunction. - CT angiogram 12/20/2022: Negative for PE.  Multifocal parenchymal opacities.  No adenopathy. - Keytruda started on 01/07/2023.   2.  Social/family history: - Lives at home with his wife. - Worked for [employer].  Quit smoking 40 years ago. - No family history of malignancies.    Plan: 1.  Metastatic urothelial carcinoma of the bladder to the lungs and bones: - He does not report any  immunotherapy related side effects. - He has chronic cough for which he takes Robitussin as needed.  Clear expectoration. - Labs today: Normal LFTs and creatinine.  CBC was normal.  Last TSH is 1.041. - She may proceed with Keytruda today and in 3 weeks.  RT 6 weeks for follow-up with repeat CT CAP with contrast.   2.  Recurrent right pleural effusion: - He is having thoracentesis on a weekly basis on the right side and infrequently on the left side.  Last right thoracentesis on 11/21/2023, 1.4 L removed.  Last left thoracentesis on 11/11/2023, 1 L removed. - We previously discussed role of Pleurx catheter but he declined.   3.  Loss of appetite/weight loss: - He is not taking Megace.  He is eating 1 meal per day and maintaining his weight.  He also drinks 2 to 3 cans of boost per day.  His wife is planning to start him on some protein powder.   4.  Immunotherapy induced skin rash: - Continue prednisone 10 mg daily.  Use hydroxyzine for itching as needed.   5.  Cancer-related fatigue: - May continue Ritalin 10 mg in the mornings as needed.    Orders Placed This Encounter  Procedures   US THORACENTESIS ASP PLEURAL SPACE W/IMG GUIDE    Standing Status:   Future    Expiration Date:   11/28/2024    Are labs required for specimen collection?:   No    Reason for Exam (SYMPTOM  OR DIAGNOSIS REQUIRED):   pleural effusion    Preferred imaging location?:   [facility]   US THORACENTESIS ASP PLEURAL SPACE W/IMG GUIDE    Standing Status:   Future  Expiration Date:   11/28/2024    Are labs required for specimen collection?:   No    Reason for Exam (SYMPTOM  OR DIAGNOSIS REQUIRED):   pleural effusion    Preferred imaging location?:   [facility]   US THORACENTESIS ASP PLEURAL SPACE W/IMG GUIDE    Standing Status:   Future    Expiration Date:   11/28/2024    Are labs required for specimen collection?:   No    Reason for Exam (SYMPTOM  OR DIAGNOSIS REQUIRED):   pleural effusion     Preferred imaging location?:   [facility]   US THORACENTESIS ASP PLEURAL SPACE W/IMG GUIDE    Standing Status:   Future    Expiration Date:   11/28/2024    Are labs required for specimen collection?:   No    Reason for Exam (SYMPTOM  OR DIAGNOSIS REQUIRED):   pleural effusion    Preferred imaging location?:   [facility]   US THORACENTESIS ASP PLEURAL SPACE W/IMG GUIDE    Standing Status:   Future    Expiration Date:   11/28/2024    Are labs required for specimen collection?:   No    Reason for Exam (SYMPTOM  OR DIAGNOSIS REQUIRED):   pleural effusion    Preferred imaging location?:   [facility]   US THORACENTESIS ASP PLEURAL SPACE W/IMG GUIDE    Standing Status:   Future    Expiration Date:   11/28/2024    Are labs required for specimen collection?:   No    Reason for Exam (SYMPTOM  OR DIAGNOSIS REQUIRED):   pleural effusion    Preferred imaging location?:   [facility]   US THORACENTESIS ASP PLEURAL SPACE W/IMG GUIDE    Standing Status:   Future    Expiration Date:   11/28/2024    Are labs required for specimen collection?:   No    Reason for Exam (SYMPTOM  OR DIAGNOSIS REQUIRED):   pleural effusion    Preferred imaging location?:   [facility]   US THORACENTESIS ASP PLEURAL SPACE W/IMG GUIDE    Standing Status:   Future    Expiration Date:   11/28/2024    Are labs required for specimen collection?:   No    Reason for Exam (SYMPTOM  OR DIAGNOSIS REQUIRED):   pleural effusion    Preferred imaging location?:   [facility]   US THORACENTESIS ASP PLEURAL SPACE W/IMG GUIDE    Standing Status:   Future    Expiration Date:   11/28/2024    Are labs required for specimen collection?:   No    Reason for Exam (SYMPTOM  OR DIAGNOSIS REQUIRED):   pleural effusion    Preferred imaging location?:   [facility]   US THORACENTESIS ASP PLEURAL SPACE W/IMG GUIDE    Standing Status:   Future    Expiration Date:   11/28/2024    Are labs required  for specimen collection?:   No    Reason for Exam (SYMPTOM  OR DIAGNOSIS REQUIRED):   pleural effusion    Preferred imaging location?:   [facility]   US THORACENTESIS ASP PLEURAL SPACE W/IMG GUIDE    Standing Status:   Future    Expiration Date:   11/28/2024    Are labs required for specimen collection?:   No    Reason for Exam (SYMPTOM  OR DIAGNOSIS REQUIRED):   pleural  effusion    Preferred imaging location?:   [facility]   US THORACENTESIS ASP PLEURAL SPACE W/IMG GUIDE    Standing Status:   Future    Expiration Date:   11/28/2024    Are labs required for specimen collection?:   No    Reason for Exam (SYMPTOM  OR DIAGNOSIS REQUIRED):   pleural effusion    Preferred imaging location?:   [facility]   CT CHEST ABDOMEN PELVIS W CONTRAST    Standing Status:   Future    Expected Date:   12/29/2023    Expiration Date:   11/28/2024    If indicated for the ordered procedure, I authorize the administration of contrast media per Radiology protocol:   Yes    Does the patient have a contrast media/X-ray dye allergy?:   No    Preferred imaging location?:   [facility]    If indicated for the ordered procedure, I authorize the administration of oral contrast media per Radiology protocol:   Yes      I,Helena R Teague,acting as a scribe for Doreatha Massed, MD.,have documented all relevant documentation on the behalf of Doreatha Massed, MD,as directed by  Doreatha Massed, MD while in the presence of Doreatha Massed, MD.  I, Doreatha Massed MD, have reviewed the above documentation for accuracy and completeness, and I agree with the above.       Doreatha Massed, MD   4/15/20251:31 PM  CHIEF COMPLAINT:   Diagnosis: Metastatic urothelial carcinoma of the bladder    Cancer Staging  No matching staging information was found for the patient.    Prior Therapy: 1. Neoadjuvant gemcitabine/cisplatin on 05/02/17 (one cycle) 2.  Cystoprostatectomy and bilateral lymphadenectomy on 06/29/2017 (Dr. Berneice Heinrich) 3. Radiation therapy to the pelvic lesion 01/02/18 - 01/16/18 4. Carboplatin and gemcitabine, 6 cycles 02/06/18 - 05/22/18 5. Pembrolizumab 06/21/18 - 12/13/18 6. Radiation therapy to RUL and right paratracheal nodes 04/09/20 - 04/23/20 7. Radiation therapy to intrathoracic lymph nodes 11/16/21 - 11/27/21  Current Therapy:  pembrolizumab    HISTORY OF PRESENT ILLNESS:   Oncology History  Stage IV malignant neoplasm of urinary bladder /with mets to the bones and lungs  04/20/2017 Initial Diagnosis   Malignant neoplasm of urinary bladder (HCC)   01/30/2018 - 05/22/2018 Chemotherapy   The patient had palonosetron (ALOXI) injection 0.25 mg, 0.25 mg, Intravenous,  Once, 6 of 6 cycles Administration: 0.25 mg (01/30/2018), 0.25 mg (02/20/2018), 0.25 mg (03/13/2018), 0.25 mg (04/03/2018), 0.25 mg (05/01/2018), 0.25 mg (05/22/2018) CARBOplatin (PARAPLATIN) 400 mg in sodium chloride 0.9 % 250 mL chemo infusion, 400 mg (100 % of original dose 400 mg), Intravenous,  Once, 6 of 6 cycles Dose modification: 400 mg (original dose 400 mg, Cycle 1),   (original dose 400 mg, Cycle 2, Reason: Patient Age), 410 mg (original dose 400 mg, Cycle 6) Administration: 400 mg (01/30/2018), 400 mg (02/20/2018), 400 mg (03/13/2018), 410 mg (04/03/2018), 410 mg (05/01/2018), 410 mg (05/22/2018) gemcitabine (GEMZAR) 2,128 mg in sodium chloride 0.9 % 250 mL chemo infusion, 1,000 mg/m2 = 2,128 mg, Intravenous,  Once, 6 of 6 cycles Dose modification: 800 mg/m2 (original dose 1,000 mg/m2, Cycle 4, Reason: Provider Judgment), 800 mg/m2 (original dose 1,000 mg/m2, Cycle 4, Reason: Provider Judgment) Administration: 2,128 mg (01/30/2018), 2,128 mg (02/06/2018), 2,128 mg (02/20/2018), 2,128 mg (02/27/2018), 2,128 mg (03/13/2018), 1,710 mg (04/03/2018), 1,710 mg (04/10/2018), 1,710 mg (05/01/2018), 1,710 mg (05/22/2018) fosaprepitant (EMEND) 150 mg, dexamethasone (DECADRON) 12 mg in sodium chloride  0.9 %  145 mL IVPB, , Intravenous,  Once, 6 of 6 cycles Administration:  (01/30/2018),  (02/20/2018),  (03/13/2018),  (04/03/2018),  (05/01/2018),  (05/22/2018)  for chemotherapy treatment.    01/07/2023 -  Chemotherapy   Patient is on Treatment Plan : BLADDER Pembrolizumab (200) q21d     Bladder cancer (HCC)  06/28/2017 Initial Diagnosis   Bladder cancer (HCC)   06/21/2018 - 12/13/2018 Chemotherapy   The patient had pembrolizumab (KEYTRUDA) 200 mg in sodium chloride 0.9 % 50 mL chemo infusion, 200 mg, Intravenous, Once, 8 of 9 cycles Administration: 200 mg (06/21/2018), 200 mg (07/12/2018), 200 mg (08/02/2018), 200 mg (08/23/2018), 200 mg (09/13/2018), 200 mg (10/04/2018), 200 mg (11/22/2018), 200 mg (12/13/2018)  for chemotherapy treatment.       INTERVAL HISTORY:   Dorell is a 83 y.o. male presenting to clinic today for follow up of Metastatic urothelial carcinoma of the bladder. He was last seen by me on 10/17/23.  Today, he states that he is doing well overall. His appetite level is at 70%. His energy level is at 0%. Jeriah is accompanied by his wife. His wife notes he has been doing weekly thoracentesis. He reports 2-3 days of relief after thoracentesis. His appetite has somewhat improved, though he is not taking Megace. Srihan eats 1 meal a day and drinks 2-3 Boosts a day. His wife states she has ordered him protein powder. He is not taking Ritalin. He is taking Prednisone as prescribed and hydroxyzine as prescribed.   Sherard notes a cough producing white phlegm. He has associated SOB when coughing and has been treating his cough with a cough syrup. He denies any diarrhea.   PAST MEDICAL HISTORY:  0 Past Medical History: Past Medical History:  Diagnosis Date   Bladder cancer (HCC)    Bone cancer (HCC)    BPH (benign prostatic hypertrophy)    Dysuria    GERD (gastroesophageal reflux disease)    History of adenomatous polyp of colon    tubular adenoma 06/ 2018   History of bladder cancer  12/2014   urologist-  dr Sherryl Barters--  dx High Grade TCC without stromal invasion s/p TURBT and intravesical BCG tx/  recurrent 07/2015 (pTa) TCC  repear intravesical BCG tx    History of hiatal hernia    History of urethral stricture    Nocturia     Surgical History: Past Surgical History:  Procedure Laterality Date   CARDIAC CATHETERIZATION  1997   normal coronary arteries   CARDIOVASCULAR STRESS TEST  12-14-2005   Low risk perfusion study/  very small scar in the inferior septum from mid ventricle to apex,  no ischemia/  mild inferior septal hypokinesis/  ef 58%   CATARACT EXTRACTION W/ INTRAOCULAR LENS  IMPLANT, BILATERAL  2011   COLONOSCOPY  last one 06/ 2018   CYSTOSCOPY WITH BIOPSY N/A 08/12/2015   Procedure: CYSTOSCOPY WITH BIOPSY AND FULGERATION;  Surgeon: Hildred Laser, MD;  Location: [facility];  Service: Urology;  Laterality: N/A;   CYSTOSCOPY WITH INJECTION N/A 06/29/2017   Procedure: CYSTOSCOPY WITH INJECTION OF INDOCYANINE GREEN DYE;  Surgeon: Sebastian Ache, MD;  Location: [facility];  Service: Urology;  Laterality: N/A;   CYSTOSCOPY WITH URETHRAL DILATATION N/A 03/21/2017   Procedure: CYSTOSCOPY;  Surgeon: Hildred Laser, MD;  Location: [facility];  Service: Urology;  Laterality: N/A;   ESOPHAGOGASTRODUODENOSCOPY  12-05-2014   IR IMAGING GUIDED PORT INSERTION  02/07/2018   IR NEPHROSTOMY PLACEMENT LEFT  12/25/2021  IR REMOVAL TUN ACCESS W/ PORT W/O FL MOD SED  04/14/2018   TRANSTHORACIC ECHOCARDIOGRAM  01-31-2008   nomral LVF,  ef 55-65%/  mild pulmonic stenosis without regurg.,  peak transpulomonic valve gradient   TRANSURETHRAL RESECTION OF BLADDER TUMOR N/A 12/31/2014   Procedure: TRANSURETHRAL RESECTION OF BLADDER TUMOR (TURBT);  Surgeon: Jock Muller, MD;  Location: [facility];  Service: Urology;  Laterality: N/A;   TRANSURETHRAL RESECTION OF BLADDER TUMOR N/A 03/21/2017   Procedure: POSSIBLE TRANSURETHRAL RESECTION  OF BLADDER TUMOR (TURBT);  Surgeon: Bart Born, MD;  Location: [facility];  Service: Urology;  Laterality: N/A;    Social History: Social History   Socioeconomic History   Marital status: Married    Spouse name: Not on file   Number of children: 1   Years of education: Not on file   Highest education level: Not on file  Occupational History   Occupation: Retired  Tobacco Use   Smoking status: Former    Current packs/day: 0.00    Average packs/day: 1 pack/day for 34.0 years (34.0 ttl pk-yrs)    Types: Cigarettes    Start date: 12/27/1954    Quit date: 12/26/1988    Years since quitting: 34.9   Smokeless tobacco: Never  Vaping Use   Vaping status: Never Used  Substance and Sexual Activity   Alcohol use: Yes    Alcohol/week: 6.0 standard drinks of alcohol    Types: 6 Cans of beer per week    Comment: BEER   Drug use: No   Sexual activity: Not Currently  Other Topics Concern   Not on file  Social History Narrative   Not on file   Social Drivers of Health   Financial Resource Strain: Not on file  Food Insecurity: No Food Insecurity (12/20/2022)   Hunger Vital Sign    Worried About Running Out of Food in the Last Year: Never true    Ran Out of Food in the Last Year: Never true  Transportation Needs: No Transportation Needs (12/20/2022)   PRAPARE - Administrator, Civil Service (Medical): No    Lack of Transportation (Non-Medical): No  Physical Activity: Not on file  Stress: Not on file  Social Connections: Unknown (12/28/2021)   [facility] from [facility]   Social Network    Social Network: Not on file  Intimate Partner Violence: Not At Risk (12/20/2022)   Humiliation, Afraid, Rape, and Kick questionnaire    Fear of Current or Ex-Partner: No    Emotionally Abused: No    Physically Abused: No    Sexually Abused: No    Family History: Family History  Problem Relation Age of Onset   Alcoholism Father    Colon cancer  Neg Hx    Colon polyps Neg Hx    Diabetes Neg Hx    Kidney disease Neg Hx    Esophageal cancer Neg Hx    Heart disease Neg Hx    Gallbladder disease Neg Hx    Allergic rhinitis Neg Hx    Angioedema Neg Hx    Asthma Neg Hx    Atopy Neg Hx    Eczema Neg Hx    Immunodeficiency Neg Hx    Urticaria Neg Hx     Current Medications:  Current Outpatient Medications:    acetaminophen (TYLENOL) 500 MG tablet, Take 1,000 mg by mouth every 6 (six) hours as needed for mild pain, fever, moderate pain or headache., Disp: ,  Rfl:    apixaban (ELIQUIS) 5 MG TABS tablet, Take 1 tablet (5 mg total) by mouth 2 (two) times daily., Disp: 28 tablet, Rfl: 0   apixaban (ELIQUIS) 5 MG TABS tablet, Take 1 tablet (5 mg total) by mouth 2 (two) times daily., Disp: 28 tablet, Rfl: 0   betamethasone dipropionate 0.05 % cream, Apply topically 2 (two) times daily., Disp: 60 g, Rfl: 2   budesonide-formoterol (SYMBICORT) 160-4.5 MCG/ACT inhaler, Inhale 2 puffs into the lungs 2 (two) times daily., Disp: 1 each, Rfl: 6   chlorpheniramine-HYDROcodone (TUSSIONEX) 10-8 MG/5ML, Take 5 mLs by mouth every 12 (twelve) hours as needed (severe cough)., Disp: 70 mL, Rfl: 0   dextromethorphan-guaiFENesin (MUCINEX DM) 30-600 MG 12hr tablet, Take 1 tablet by mouth 2 (two) times daily., Disp: , Rfl:    hydrOXYzine (ATARAX) 25 MG tablet, Take 1 tablet (25 mg total) by mouth 3 (three) times daily as needed., Disp: 90 tablet, Rfl: 0   levothyroxine (SYNTHROID) 75 MCG tablet, Take 75 mcg by mouth daily., Disp: , Rfl:    megestrol (MEGACE) 400 MG/10ML suspension, Take 10 mLs (400 mg total) by mouth 2 (two) times daily., Disp: 480 mL, Rfl: 3   methenamine (HIPREX) 1 g tablet, Take 1 tablet (1 g total) by mouth 2 (two) times daily., Disp: 60 tablet, Rfl: 11   methylphenidate (RITALIN) 10 MG tablet, TAKE 1 TAB DAILY IF NEEDED. MAX 3 TABS/DAY., Disp: 30 tablet, Rfl: 0   metoprolol tartrate (LOPRESSOR) 25 MG tablet, Take 1 tablet (25 mg total) by  mouth 2 (two) times daily., Disp: 60 tablet, Rfl: 2   pantoprazole (PROTONIX) 40 MG tablet, Take 1 tablet (40 mg total) by mouth 2 (two) times daily., Disp: 60 tablet, Rfl: 0   predniSONE (DELTASONE) 10 MG tablet, Take 1 tablet (10 mg total) by mouth daily with breakfast., Disp: 90 tablet, Rfl: 3   SSD 1 % cream, Apply topically 2 (two) times daily., Disp: , Rfl:    Vitamin D, Ergocalciferol, (DRISDOL) 1.25 MG (50000 UNIT) CAPS capsule, Take 50,000 Units by mouth once a week., Disp: , Rfl:   Current Facility-Administered Medications:    omalizumab (XOLAIR) prefilled syringe 300 mg, 300 mg, Subcutaneous, Q14 Days, Rochester Chuck, MD, 300 mg at 11/24/22 4098   Allergies: Allergies  Allergen Reactions   Ciprofloxacin Hives   Zofran [Ondansetron] Rash    REVIEW OF SYSTEMS:   Review of Systems  Constitutional:  Negative for chills, fatigue and fever.  HENT:   Negative for lump/mass, mouth sores, nosebleeds, sore throat and trouble swallowing.   Eyes:  Negative for eye problems.  Respiratory:  Positive for cough and shortness of breath. Negative for hemoptysis.   Cardiovascular:  Negative for chest pain, leg swelling and palpitations.  Gastrointestinal:  Negative for abdominal pain, constipation, diarrhea, nausea and vomiting.  Genitourinary:  Negative for bladder incontinence, difficulty urinating, dysuria, frequency, hematuria and nocturia.   Musculoskeletal:  Negative for arthralgias, back pain, flank pain, myalgias and neck pain.  Skin:  Negative for itching and rash.  Neurological:  Negative for dizziness, headaches and numbness.  Hematological:  Does not bruise/bleed easily.  Psychiatric/Behavioral:  Negative for depression, sleep disturbance and suicidal ideas. The patient is not nervous/anxious.   All other systems reviewed and are negative.    VITALS:   There were no vitals taken for this visit.  Wt Readings from Last 3 Encounters:  11/07/23 166 lb (75.3 kg)  10/17/23  162 lb (73.5 kg)  10/12/23  166 lb (75.3 kg)    There is no height or weight on file to calculate BMI.  Performance status (ECOG): 1 - Symptomatic but completely ambulatory  PHYSICAL EXAM:   Physical Exam Vitals and nursing note reviewed. Exam conducted with a chaperone present.  Constitutional:      Appearance: Normal appearance.  Cardiovascular:     Rate and Rhythm: Normal rate and regular rhythm.     Pulses: Normal pulses.     Heart sounds: Normal heart sounds.  Pulmonary:     Effort: Pulmonary effort is normal.     Breath sounds: Normal breath sounds.  Abdominal:     Palpations: Abdomen is soft. There is no hepatomegaly, splenomegaly or mass.     Tenderness: There is no abdominal tenderness.  Musculoskeletal:     Right lower leg: No edema.     Left lower leg: No edema.  Lymphadenopathy:     Cervical: No cervical adenopathy.     Right cervical: No superficial, deep or posterior cervical adenopathy.    Left cervical: No superficial, deep or posterior cervical adenopathy.     Upper Body:     Right upper body: No supraclavicular or axillary adenopathy.     Left upper body: No supraclavicular or axillary adenopathy.  Neurological:     General: No focal deficit present.     Mental Status: He is alert and oriented to person, place, and time.  Psychiatric:        Mood and Affect: Mood normal.        Behavior: Behavior normal.     LABS:      Latest Ref Rng & Units 11/29/2023   12:15 PM 11/07/2023   12:15 PM 10/17/2023   12:42 PM  CBC  WBC 4.0 - 10.5 K/uL 10.5  12.2  13.3   Hemoglobin 13.0 - 17.0 g/dL 78.2  95.6  21.3   Hematocrit 39.0 - 52.0 % 48.6  48.6  47.9   Platelets 150 - 400 K/uL 303  242  247       Latest Ref Rng & Units 11/29/2023   12:15 PM 11/07/2023   12:15 PM 10/17/2023   12:42 PM  CMP  Glucose 70 - 99 mg/dL 086  578  469   BUN 8 - 23 mg/dL 22  21  21    Creatinine 0.61 - 1.24 mg/dL 6.29  5.28  4.13   Sodium 135 - 145 mmol/L 140  140  135   Potassium 3.5  - 5.1 mmol/L 3.9  4.5  4.8   Chloride 98 - 111 mmol/L 103  106  107   CO2 22 - 32 mmol/L 25  25  18    Calcium 8.9 - 10.3 mg/dL 9.0  8.8  8.6   Total Protein 6.5 - 8.1 g/dL 6.0  5.6  5.2   Total Bilirubin 0.0 - 1.2 mg/dL 0.5  0.4  0.6   Alkaline Phos 38 - 126 U/L 54  41  53   AST 15 - 41 U/L 25  22  17    ALT 0 - 44 U/L 17  25  11       No results found for: "CEA1", "CEA" / No results found for: "CEA1", "CEA" No results found for: "PSA1" No results found for: "KGM010" No results found for: "CAN125"  No results found for: "TOTALPROTELP", "ALBUMINELP", "A1GS", "A2GS", "BETS", "BETA2SER", "GAMS", "MSPIKE", "SPEI" Lab Results  Component Value Date   TIBC 249 03/20/2018   FERRITIN 759 (H) 03/20/2018  IRONPCTSAT 20 (L) 03/20/2018   Lab Results  Component Value Date   LDH 96 (L) 06/21/2022     STUDIES:   US THORACENTESIS ASP PLEURAL SPACE W/IMG GUIDE Result Date: 11/21/2023 INDICATION: Patient history of metastatic bladder cancer with recurrent bilateral pleural effusions. Known to IR from previous thoracenteses, most recent being 11/11/23 (1L), 11/04/23 (1.9L). EXAM: ULTRASOUND GUIDED THERAPEUTIC THORACENTESIS MEDICATIONS: 6 mL 1% lidocaine COMPLICATIONS: None immediate. PROCEDURE: An ultrasound guided thoracentesis was thoroughly discussed with the patient and questions answered. The benefits, risks, alternatives and complications were also discussed. The patient understands and wishes to proceed with the procedure. Written consent was obtained. Ultrasound was performed to localize and mark an adequate pocket of fluid in the right chest. The area was then prepped and draped in the normal sterile fashion. 1% Lidocaine was used for local anesthesia. Under ultrasound guidance a 6 Fr Safe-T-Centesis catheter was introduced. Thoracentesis was performed. The catheter was removed and a dressing applied. FINDINGS: A total of approximately 1.4 liters of yellow fluid was removed. Samples were sent to the  laboratory as requested by the clinical team. IMPRESSION: Successful ultrasound guided therapeutic right thoracentesis yielding 1.4 liters of pleural fluid. Post-procedural chest x-ray ordered. Performed by Carlton Adam, NP Electronically Signed   By: Marliss Coots M.D.   On: 11/21/2023 14:21   DG Chest 1 View Result Date: 11/21/2023 CLINICAL DATA:  Status post right right-sided pleural effusion EXAM: CHEST  1 VIEW COMPARISON:  11/11/2023 FINDINGS: Volume loss in the right hemithorax. Tracheal deviation right. Normal heart size. Pleuroparenchymal opacity in the inferior right hemithorax is relatively similar. The apical pleural fluid component is decreased. No pneumothorax. The left lung is clear with persistent interstitial coarsening. There may be trace left pleural fluid. IMPRESSION: No pneumothorax after right-sided thoracentesis. Possible trace left pleural fluid. Electronically Signed   By: Jeronimo Greaves M.D.   On: 11/21/2023 12:10   US THORACENTESIS ASP PLEURAL SPACE W/IMG GUIDE Result Date: 11/11/2023 INDICATION: Patient history of metastatic bladder cancer with recurrent bilateral pleural effusions. Interventional Radiology asked to perform a therapeutic thoracentesis. EXAM: ULTRASOUND GUIDED THORACENTESIS MEDICATIONS: 1% lidocaine 10 mL COMPLICATIONS: None immediate. PROCEDURE: An ultrasound guided thoracentesis was thoroughly discussed with the patient and questions answered. The benefits, risks, alternatives and complications were also discussed. The patient understands and wishes to proceed with the procedure. Written consent was obtained. Ultrasound was performed to localize and mark an adequate pocket of fluid in the left chest. The area was then prepped and draped in the normal sterile fashion. 1% Lidocaine was used for local anesthesia. Under ultrasound guidance a 6 Fr Safe-T-Centesis catheter was introduced. Thoracentesis was performed. The catheter was removed and a dressing applied.  FINDINGS: A total of approximately 1 L of clear yellow fluid was removed. IMPRESSION: Successful ultrasound guided left thoracentesis yielding 1 L of pleural fluid. Procedure performed by Alwyn Ren, NP Electronically Signed   By: Marliss Coots M.D.   On: 11/11/2023 12:54   DG Chest 1 View Result Date: 11/11/2023 CLINICAL DATA:  Status post right thoracentesis. 1 L of pleural fluid was removed. EXAM: CHEST  1 VIEW COMPARISON:  Prior chest x-ray 11/04/2023; 10/28/2023 FINDINGS: No evidence of pneumothorax or other complication following right-sided thoracentesis. Less reinflation of the right lung than seen previously. The left lung remains relatively clear. Bronchitic changes are stable. Cardiac and mediastinal contours are unchanged. No acute osseous abnormality. IMPRESSION: No evidence of pneumothorax following right-sided thoracentesis. Electronically Signed   By: Malachy Moan  M.D.   On: 11/11/2023 11:04   US  THORACENTESIS ASP PLEURAL SPACE W/IMG GUIDE Result Date: 11/04/2023 INDICATION: 83 y.o. Male. History of metastatic bladder cancer with recurrent bilateral pleural effusions. Request is for therapeutic right sided thoracentesis EXAM: ULTRASOUND GUIDED THERAPEUTIC RIGHT SIDED THORACENTESIS MEDICATIONS: lidocaine 1% - 10 ml COMPLICATIONS: None immediate. PROCEDURE: An ultrasound guided thoracentesis was thoroughly discussed with the patient and questions answered. The benefits, risks, alternatives and complications were also discussed. The patient understands and wishes to proceed with the procedure. Written consent was obtained. Ultrasound was performed to localize and mark an adequate pocket of fluid in the right chest. The area was then prepped and draped in the normal sterile fashion. 1% Lidocaine was used for local anesthesia. Under ultrasound guidance a 6 Fr Safe-T-Centesis catheter was introduced. Thoracentesis was performed. The catheter was removed and a dressing applied. FINDINGS: A  total of approximately 1.9 liters of straw colored fluid was removed. IMPRESSION: Successful ultrasound guided therapeutic right sided thoracentesis yielding 1.9 liters of pleural fluid. Performed by Reagan Camera NP Electronically Signed   By: Creasie Doctor M.D.   On: 11/04/2023 13:43   DG Chest Portable 1 View Result Date: 11/04/2023 CLINICAL DATA:  83 year old male status post right side thoracentesis EXAM: PORTABLE CHEST 1 VIEW COMPARISON:  Portable chest 10/28/2023 and earlier. FINDINGS: Portable AP upright view at 1037 hours. Decreased right pleural effusion and no pneumothorax identified. Improved right lung base ventilation. Stable cardiac size and mediastinal contours. Stable left lung ventilation. No acute osseous abnormality identified. Negative visible bowel gas. IMPRESSION: Decreased right pleural effusion and no pneumothorax following thoracentesis. Electronically Signed   By: Marlise Simpers M.D.   On: 11/04/2023 11:03

## 2023-11-29 NOTE — Patient Instructions (Signed)

## 2023-11-29 NOTE — Patient Instructions (Signed)
 [facility] [place] - A DEPT OF [facility]  Discharge Instructions: Thank you for choosing [facility] to provide your oncology and hematology care.  If you have a lab appointment with the Cancer Center - please note that after April 8th, 2024, all labs will be drawn in the cancer center.  You do not have to check in or register with the main entrance as you have in the past but will complete your check-in in the cancer center.  Wear comfortable clothing and clothing appropriate for easy access to any Portacath or PICC line.   We strive to give you quality time with your provider. You may need to reschedule your appointment if you arrive late (15 or more minutes).  Arriving late affects you and other patients whose appointments are after yours.  Also, if you miss three or more appointments without notifying the office, you may be dismissed from the clinic at the provider's discretion.      For prescription refill requests, have your pharmacy contact our office and allow 72 hours for refills to be completed.    Today you received the following chemotherapy and/or immunotherapy agents [name]      To help prevent nausea and vomiting after your treatment, we encourage you to take your nausea medication as directed.  BELOW ARE SYMPTOMS THAT SHOULD BE REPORTED IMMEDIATELY: *FEVER GREATER THAN 100.4 F (38 C) OR HIGHER *CHILLS OR SWEATING *NAUSEA AND VOMITING THAT IS NOT CONTROLLED WITH YOUR NAUSEA MEDICATION *UNUSUAL SHORTNESS OF BREATH *UNUSUAL BRUISING OR BLEEDING *URINARY PROBLEMS (pain or burning when urinating, or frequent urination) *BOWEL PROBLEMS (unusual diarrhea, constipation, pain near the anus) TENDERNESS IN MOUTH AND THROAT WITH OR WITHOUT PRESENCE OF ULCERS (sore throat, sores in mouth, or a toothache) UNUSUAL RASH, SWELLING OR PAIN  UNUSUAL VAGINAL DISCHARGE OR ITCHING   Items with * indicate a potential emergency and should be followed up  as soon as possible or go to the Emergency Department if any problems should occur.  Please show the CHEMOTHERAPY ALERT CARD or IMMUNOTHERAPY ALERT CARD at check-in to the Emergency Department and triage nurse.  Should you have questions after your visit or need to cancel or reschedule your appointment, please contact [facility] [place] - A DEPT OF Eligh[facility] Brideg[name] [phone]  and follow the prompts.  Office hours are 8:00 a.m. to 4:30 p.m. Monday - Friday. Please note that voicemails left after 4:00 p.m. may not be returned until the following business day.  We are closed weekends and major holidays. You have access to a nurse at all times for urgent questions. Please call the main number to the clinic [phone] and follow the prompts.  For any non-urgent questions, you may also contact your provider using MyChart. We now offer e-Visits for anyone 10 and older to request care online for non-urgent symptoms. For details visit [email or web address].   Also download the MyChart app! Go to the app store, search "MyChart", open the app, select [facility], and log in with your MyChart username and password.

## 2023-11-29 NOTE — Progress Notes (Signed)
Patient tolerated therapy with no complaints voiced.  Side effects with management reviewed with understanding verbalized.  Peripheral IV site clean and dry with no bruising or swelling noted at site.  Good blood return noted before and after administration of therapy.  Band aid applied.  Patient left in satisfactory condition with VSS and no s/s of distress noted.

## 2023-11-30 ENCOUNTER — Encounter (HOSPITAL_COMMUNITY): Payer: Self-pay

## 2023-11-30 ENCOUNTER — Ambulatory Visit (HOSPITAL_COMMUNITY)
Admission: RE | Admit: 2023-11-30 | Discharge: 2023-11-30 | Disposition: A | Discharge status: Home / Self Care | Source: Ambulatory Visit | Attending: Physician Assistant | Admitting: Physician Assistant

## 2023-11-30 ENCOUNTER — Ambulatory Visit (HOSPITAL_COMMUNITY)
Admission: RE | Admit: 2023-11-30 | Discharge: 2023-11-30 | Disposition: A | Discharge status: Home / Self Care | Source: Ambulatory Visit | Attending: Hematology | Admitting: Hematology

## 2023-11-30 DIAGNOSIS — J90 Pleural effusion, not elsewhere classified: Secondary | ICD-10-CM | POA: Insufficient documentation

## 2023-11-30 DIAGNOSIS — J929 Pleural plaque without asbestos: Secondary | ICD-10-CM | POA: Diagnosis not present

## 2023-11-30 DIAGNOSIS — R918 Other nonspecific abnormal finding of lung field: Secondary | ICD-10-CM | POA: Diagnosis not present

## 2023-11-30 DIAGNOSIS — Z48813 Encounter for surgical aftercare following surgery on the respiratory system: Secondary | ICD-10-CM | POA: Diagnosis not present

## 2023-11-30 DIAGNOSIS — R9389 Abnormal findings on diagnostic imaging of other specified body structures: Secondary | ICD-10-CM | POA: Diagnosis not present

## 2023-11-30 MED ORDER — LIDOCAINE HCL (PF) 2 % IJ SOLN
10.0000 mL | Freq: Once | INTRAMUSCULAR | Status: AC
  Administered 2023-11-30: 10 mL

## 2023-11-30 MED ORDER — LIDOCAINE HCL (PF) 2 % IJ SOLN
INTRAMUSCULAR | Status: AC
  Filled 2023-11-30: qty 20

## 2023-11-30 NOTE — Progress Notes (Signed)
 Post one hour chest xray completed and read by Radiologist (Dr. Marlena Sima) and okay for patient to d/c to home with wife. Patient and wife verbalized understanding of discharge instructions and left with no acute distress noted.

## 2023-11-30 NOTE — Progress Notes (Signed)
 Right sided Thoracentesis procedure completed today and 1.5 Liters of pleural fluid removed. Patient held after completion of post chest xray due to shortness of breath and coughing. Radiologist gave order to hold patient and report chest xray in one hour and standing to make sure no pneumothorax. Patient and wife resting comfortably at this time and aware of needed repeat chest xray and patient had no acute distress noted now with vital signs within normal limits.

## 2023-11-30 NOTE — Procedures (Signed)
 PROCEDURE SUMMARY:  Successful image-guided right thoracentesis. Yielded 1.5 liters of clear yellow fluid. Procedure aborted at this amount per patient request due to previous post procedure pain with larger volumes. Significant pleural effusion remains on post procedure US . Patient tolerated procedure well. EBL < 1 mL No immediate complications.  Specimen was not sent for labs. Initial post procedure CXR shows possible small pneumothorax, repeat CXR in 1 hour showed no acute concerns. Patient reports feeling at baseline and was discharged home with return precautions.  Please see imaging section of Epic for full dictation.  Marilu Shown PA-C 11/30/2023 11:41 AM

## 2023-12-09 ENCOUNTER — Encounter (HOSPITAL_COMMUNITY): Payer: Self-pay

## 2023-12-09 ENCOUNTER — Ambulatory Visit (HOSPITAL_COMMUNITY)
Admission: RE | Admit: 2023-12-09 | Discharge: 2023-12-09 | Disposition: A | Discharge status: Home / Self Care | Source: Ambulatory Visit | Attending: Hematology | Admitting: Hematology

## 2023-12-09 VITALS — BP 102/62 | HR 92 | Temp 98.1°F | Resp 19

## 2023-12-09 DIAGNOSIS — S22068A Other fracture of T7-T8 thoracic vertebra, initial encounter for closed fracture: Secondary | ICD-10-CM | POA: Insufficient documentation

## 2023-12-09 DIAGNOSIS — X58XXXA Exposure to other specified factors, initial encounter: Secondary | ICD-10-CM | POA: Insufficient documentation

## 2023-12-09 DIAGNOSIS — Z9889 Other specified postprocedural states: Secondary | ICD-10-CM

## 2023-12-09 DIAGNOSIS — C771 Secondary and unspecified malignant neoplasm of intrathoracic lymph nodes: Secondary | ICD-10-CM | POA: Diagnosis not present

## 2023-12-09 DIAGNOSIS — R911 Solitary pulmonary nodule: Secondary | ICD-10-CM | POA: Insufficient documentation

## 2023-12-09 DIAGNOSIS — J939 Pneumothorax, unspecified: Secondary | ICD-10-CM | POA: Diagnosis not present

## 2023-12-09 DIAGNOSIS — C7951 Secondary malignant neoplasm of bone: Secondary | ICD-10-CM | POA: Insufficient documentation

## 2023-12-09 DIAGNOSIS — C679 Malignant neoplasm of bladder, unspecified: Secondary | ICD-10-CM | POA: Insufficient documentation

## 2023-12-09 DIAGNOSIS — Z48813 Encounter for surgical aftercare following surgery on the respiratory system: Secondary | ICD-10-CM | POA: Diagnosis not present

## 2023-12-09 DIAGNOSIS — J90 Pleural effusion, not elsewhere classified: Secondary | ICD-10-CM | POA: Insufficient documentation

## 2023-12-09 DIAGNOSIS — J929 Pleural plaque without asbestos: Secondary | ICD-10-CM | POA: Diagnosis not present

## 2023-12-09 MED ORDER — LIDOCAINE HCL (PF) 2 % IJ SOLN
INTRAMUSCULAR | Status: AC
Start: 2023-12-09 — End: ?
  Filled 2023-12-09: qty 10

## 2023-12-09 MED ORDER — LIDOCAINE HCL (PF) 2 % IJ SOLN
10.0000 mL | Freq: Once | INTRAMUSCULAR | Status: AC
  Administered 2023-12-09: 10 mL via INTRADERMAL

## 2023-12-09 NOTE — Procedures (Signed)
 PROCEDURE SUMMARY:  Successful US  guided left thoracentesis. Yielded 1.0 liters of clear, yellow fluid. Pt tolerated procedure well. No immediate complications.  Specimen was not sent for labs. CXR ordered.  EBL < 5 mL  Lillian Rein PA-C 12/09/2023 9:32 AM

## 2023-12-09 NOTE — Progress Notes (Signed)
 Pt had left sided thoracentesis. Removed 1 L of yellow pleural fluid. VSS. No distress. Chest xray completed. Pt wheeled out of facility

## 2023-12-12 ENCOUNTER — Ambulatory Visit (HOSPITAL_COMMUNITY)
Admission: RE | Admit: 2023-12-12 | Discharge: 2023-12-12 | Disposition: A | Discharge status: Home / Self Care | Source: Ambulatory Visit | Attending: Hematology | Admitting: Hematology

## 2023-12-12 ENCOUNTER — Ambulatory Visit (HOSPITAL_COMMUNITY)
Admission: RE | Admit: 2023-12-12 | Discharge: 2023-12-12 | Disposition: A | Discharge status: Home / Self Care | Source: Ambulatory Visit | Attending: Radiology | Admitting: Radiology

## 2023-12-12 ENCOUNTER — Encounter (HOSPITAL_COMMUNITY): Payer: Self-pay

## 2023-12-12 DIAGNOSIS — R0989 Other specified symptoms and signs involving the circulatory and respiratory systems: Secondary | ICD-10-CM | POA: Diagnosis not present

## 2023-12-12 DIAGNOSIS — R918 Other nonspecific abnormal finding of lung field: Secondary | ICD-10-CM | POA: Diagnosis not present

## 2023-12-12 DIAGNOSIS — J90 Pleural effusion, not elsewhere classified: Secondary | ICD-10-CM | POA: Diagnosis not present

## 2023-12-12 DIAGNOSIS — Z48813 Encounter for surgical aftercare following surgery on the respiratory system: Secondary | ICD-10-CM | POA: Diagnosis not present

## 2023-12-12 DIAGNOSIS — Z8551 Personal history of malignant neoplasm of bladder: Secondary | ICD-10-CM | POA: Insufficient documentation

## 2023-12-12 DIAGNOSIS — C679 Malignant neoplasm of bladder, unspecified: Secondary | ICD-10-CM | POA: Diagnosis not present

## 2023-12-12 MED ORDER — LIDOCAINE HCL (PF) 2 % IJ SOLN
10.0000 mL | Freq: Once | INTRAMUSCULAR | Status: AC
  Administered 2023-12-12: 10 mL

## 2023-12-12 MED ORDER — LIDOCAINE HCL (PF) 2 % IJ SOLN
INTRAMUSCULAR | Status: AC
  Filled 2023-12-12: qty 10

## 2023-12-12 NOTE — Procedures (Signed)
 Ultrasound-guided therapeutic right sided thoracentesis performed yielding 1.3 liters of straw colored fluid. No immediate complications/ Follow-up chest x-ray pending. EBL is < 2 ml.

## 2023-12-12 NOTE — Progress Notes (Signed)
 Right sided Thoracentesis completed today and 1.3 Liters of pleural fluid removed. Patient and wife verbalized understanding of discharge instructions and patient left via wheelchair after post chest xray read by radiologist today with no acute distress noted.

## 2023-12-13 ENCOUNTER — Emergency Department (HOSPITAL_COMMUNITY)

## 2023-12-13 ENCOUNTER — Encounter (HOSPITAL_COMMUNITY): Payer: Self-pay | Admitting: Emergency Medicine

## 2023-12-13 ENCOUNTER — Other Ambulatory Visit: Payer: Self-pay

## 2023-12-13 ENCOUNTER — Emergency Department (HOSPITAL_COMMUNITY)
Admission: EM | Admit: 2023-12-13 | Discharge: 2023-12-13 | Disposition: A | Discharge status: Home / Self Care | Attending: Emergency Medicine | Admitting: Emergency Medicine

## 2023-12-13 DIAGNOSIS — M545 Low back pain, unspecified: Secondary | ICD-10-CM | POA: Diagnosis not present

## 2023-12-13 DIAGNOSIS — M533 Sacrococcygeal disorders, not elsewhere classified: Secondary | ICD-10-CM | POA: Insufficient documentation

## 2023-12-13 DIAGNOSIS — S0990XA Unspecified injury of head, initial encounter: Secondary | ICD-10-CM | POA: Diagnosis not present

## 2023-12-13 DIAGNOSIS — Z7901 Long term (current) use of anticoagulants: Secondary | ICD-10-CM | POA: Insufficient documentation

## 2023-12-13 DIAGNOSIS — I700 Atherosclerosis of aorta: Secondary | ICD-10-CM | POA: Diagnosis not present

## 2023-12-13 DIAGNOSIS — W1839XA Other fall on same level, initial encounter: Secondary | ICD-10-CM | POA: Diagnosis not present

## 2023-12-13 DIAGNOSIS — S51811A Laceration without foreign body of right forearm, initial encounter: Secondary | ICD-10-CM | POA: Insufficient documentation

## 2023-12-13 DIAGNOSIS — S300XXA Contusion of lower back and pelvis, initial encounter: Secondary | ICD-10-CM | POA: Insufficient documentation

## 2023-12-13 DIAGNOSIS — M79631 Pain in right forearm: Secondary | ICD-10-CM | POA: Diagnosis not present

## 2023-12-13 DIAGNOSIS — Z8551 Personal history of malignant neoplasm of bladder: Secondary | ICD-10-CM | POA: Diagnosis not present

## 2023-12-13 DIAGNOSIS — R519 Headache, unspecified: Secondary | ICD-10-CM | POA: Insufficient documentation

## 2023-12-13 DIAGNOSIS — I6782 Cerebral ischemia: Secondary | ICD-10-CM | POA: Diagnosis not present

## 2023-12-13 DIAGNOSIS — Y9301 Activity, walking, marching and hiking: Secondary | ICD-10-CM | POA: Diagnosis not present

## 2023-12-13 DIAGNOSIS — G9389 Other specified disorders of brain: Secondary | ICD-10-CM | POA: Diagnosis not present

## 2023-12-13 DIAGNOSIS — M48061 Spinal stenosis, lumbar region without neurogenic claudication: Secondary | ICD-10-CM | POA: Diagnosis not present

## 2023-12-13 DIAGNOSIS — M25552 Pain in left hip: Secondary | ICD-10-CM | POA: Diagnosis not present

## 2023-12-13 DIAGNOSIS — Y92009 Unspecified place in unspecified non-institutional (private) residence as the place of occurrence of the external cause: Secondary | ICD-10-CM | POA: Diagnosis not present

## 2023-12-13 DIAGNOSIS — M25551 Pain in right hip: Secondary | ICD-10-CM | POA: Insufficient documentation

## 2023-12-13 NOTE — ED Triage Notes (Signed)
 Pt wife states he  fell x2 days ago and is c/o of lower back/ coccyx pain.

## 2023-12-13 NOTE — Discharge Instructions (Signed)
 Please follow-up closely with your primary care doctor on an outpatient basis.  Return to emergency department immediately for any new or worsening symptoms.

## 2023-12-13 NOTE — ED Notes (Signed)
 Pt presents to ED after a fall on Sunday 4/27, states he fell on his tailbone. He has injury to his right arm as well, it is wrapped from home currently. Has a hx of bone and bladder cancer, and lung cancer and is in remission.States the reason for the fall was "I got off balance".

## 2023-12-13 NOTE — ED Provider Notes (Signed)
 [place] EMERGENCY DEPARTMENT AT [facility] Provider Note   CSN: [id number] Arrival date & time: 12/13/23  8295     History  Chief Complaint  Patient presents with   Tailbone Pain    [name] is a 83 y.o. male.  Patient is an 83 year old male who presents emergency department the chief complaint of low back pain, pelvis pain, right forearm pain following a fall which occurred 2 days ago.  Patient notes he had mechanical fall while walking into his home.  He is accompanied by his wife who helps provide the history.  Patient states that he is unsure if he struck his head during the fall.  He denies any pain to his neck.  He denies any other long bone or joint pain at this point.  He denies any pain to his hips or bilateral lower extremities.  He notes that the pain is worse with attempted ambulation.        Home Medications Prior to Admission medications   Medication Sig Start Date End Date Taking? Authorizing Provider  acetaminophen  (TYLENOL ) 500 MG tablet Take 1,000 mg by mouth every 6 (six) hours as needed for mild pain, fever, moderate pain or headache.    [provider]  apixaban  (ELIQUIS ) 5 MG TABS tablet Take 1 tablet (5 mg total) by mouth 2 (two) times daily. 05/31/23   Tammie Fall, MD  apixaban  (ELIQUIS ) 5 MG TABS tablet Take 1 tablet (5 mg total) by mouth 2 (two) times daily. 07/08/23   Tammie Fall, MD  betamethasone  dipropionate 0.05 % cream Apply topically 2 (two) times daily. 02/01/23   Katragadda, Sreedhar, MD  budesonide -formoterol  (SYMBICORT ) 160-4.5 MCG/ACT inhaler Inhale 2 puffs into the lungs 2 (two) times daily. 01/13/23   Sood, Vineet, MD  chlorpheniramine-HYDROcodone  (TUSSIONEX) 10-8 MG/5ML Take 5 mLs by mouth every 12 (twelve) hours as needed (severe cough). 12/24/22   Wynetta Heckle, MD  dextromethorphan -guaiFENesin  (MUCINEX  DM) 30-600 MG 12hr tablet Take 1 tablet by mouth 2 (two) times daily. 12/24/22   Wynetta Heckle, MD   hydrOXYzine  (ATARAX ) 25 MG tablet Take 1 tablet (25 mg total) by mouth 3 (three) times daily as needed. 11/15/23   Paulett Boros, MD  levothyroxine  (SYNTHROID ) 75 MCG tablet Take 75 mcg by mouth daily. 08/26/23   [provider]  megestrol  (MEGACE ) 400 MG/10ML suspension Take 10 mLs (400 mg total) by mouth 2 (two) times daily. 01/06/23   Paulett Boros, MD  methenamine  (HIPREX ) 1 g tablet Take 1 tablet (1 g total) by mouth 2 (two) times daily. 04/08/23   McKenzie, Arden Beck, MD  methylphenidate  (RITALIN ) 10 MG tablet TAKE 1 TAB DAILY IF NEEDED. MAX 3 TABS/DAY. 07/18/23   Paulett Boros, MD  metoprolol  tartrate (LOPRESSOR ) 25 MG tablet Take 1 tablet (25 mg total) by mouth 2 (two) times daily. 04/17/22   Johnson, Clanford L, MD  pantoprazole  (PROTONIX ) 40 MG tablet Take 1 tablet (40 mg total) by mouth 2 (two) times daily. 12/24/22   Wynetta Heckle, MD  predniSONE  (DELTASONE ) 10 MG tablet Take 1 tablet (10 mg total) by mouth daily with breakfast. 10/17/23   Paulett Boros, MD  SSD 1 % cream Apply topically 2 (two) times daily. 08/30/23   [provider]  Vitamin D, Ergocalciferol, (DRISDOL) 1.25 MG (50000 UNIT) CAPS capsule Take 50,000 Units by mouth once a week. 08/25/23   [provider]      Allergies    Ciprofloxacin  and Zofran  [  ondansetron ]    Review of Systems   Review of Systems  Musculoskeletal:        Low back pain, pelvis pain, right forearm pain  All other systems reviewed and are negative.   Physical Exam Updated Vital Signs BP 101/77 (BP Location: Right Arm)   Temp 98 F (36.7 C)   Ht 5\' 11"  (1.803 m)   Wt 75 kg   BMI 23.06 kg/m  Physical Exam Vitals and nursing note reviewed.  Constitutional:      Appearance: Normal appearance.  HENT:     Head: Normocephalic and atraumatic.     Nose: Nose normal.     Mouth/Throat:     Mouth: Mucous membranes are moist.  Eyes:     Extraocular Movements: Extraocular movements intact.      Conjunctiva/sclera: Conjunctivae normal.     Pupils: Pupils are equal, round, and reactive to light.  Cardiovascular:     Rate and Rhythm: Normal rate and regular rhythm.     Pulses: Normal pulses.     Heart sounds: Normal heart sounds. No murmur heard.    No gallop.  Pulmonary:     Effort: Pulmonary effort is normal. No respiratory distress.     Breath sounds: Normal breath sounds. No stridor. No wheezing, rhonchi or rales.  Abdominal:     General: Abdomen is flat. Bowel sounds are normal. There is no distension.     Palpations: Abdomen is soft.     Tenderness: There is no abdominal tenderness. There is no guarding.  Musculoskeletal:        General: Normal range of motion.     Cervical back: Normal range of motion and neck supple. No rigidity or tenderness.     Comments: Tenderness palpation noted over the right forearm, nontender palpation of remainder bilateral extremities, radial pulse 2+ lower extremities, sensation intact distally, no obvious deformity, no skin breakdown or ulceration, no overlying erythema or warmth Tenderness palpation noted over the posterior pelvis, nontender palpation of the bilateral hips, knees or feet, DP and PT pulse are 2+ distally, sensation intact distally, full range of motion noted throughout, no obvious deformity or bruising, no skin breakdown ulceration, no lacerations or abrasions Tenderness palpation over lower lumbar spine, nontender palpation over thoracic spine, no step-off or deformity, no CVA tenderness  Skin:    General: Skin is warm and dry.     Findings: No rash.     Comments: Bandage skin tear to right forearm  Neurological:     General: No focal deficit present.     Mental Status: He is alert and oriented to person, place, and time. Mental status is at baseline.  Psychiatric:        Mood and Affect: Mood normal.        Behavior: Behavior normal.        Thought Content: Thought content normal.        Judgment: Judgment normal.      ED Results / Procedures / Treatments   Labs (all labs ordered are listed, but only abnormal results are displayed) Labs Reviewed - No data to display  EKG None  Radiology DG Chest 1 View Result Date: 12/12/2023 CLINICAL DATA:  Status post right thoracentesis. Weakness and wheezing. Known metastatic urothelial carcinoma. EXAM: CHEST  1 VIEW COMPARISON:  12/09/2023 FINDINGS: Stable heart size. Stable mediastinal widening with right paratracheal prominence. Very low right-sided lung volume after thoracentesis without pneumothorax. Some residual right pleural fluid suspected. Left lung demonstrates normal aeration.  No significant left pleural fluid. No pulmonary edema. IMPRESSION: Very low right-sided lung volume after thoracentesis without pneumothorax. Some residual right pleural fluid suspected. Electronically Signed   By: Erica Hau M.D.   On: 12/12/2023 10:58   US  THORACENTESIS ASP PLEURAL SPACE W/IMG GUIDE Result Date: 12/12/2023 INDICATION: 83 y.o. Male. History of bladder cancer with recurrent bilateral pleural effusions. Request is for right sided thoracentesis. EXAM: ULTRASOUND GUIDED THERAPEUTIC RIGHT SIDED THORACENTESIS MEDICATIONS: Lidocaine  1 % - 10 mls COMPLICATIONS: None immediate. PROCEDURE: An ultrasound guided thoracentesis was thoroughly discussed with the patient and questions answered. The benefits, risks, alternatives and complications were also discussed. The patient understands and wishes to proceed with the procedure. Written consent was obtained. Ultrasound was performed to localize and mark an adequate pocket of fluid in the right chest. The area was then prepped and draped in the normal sterile fashion. 1% Lidocaine  was used for local anesthesia. Under ultrasound guidance a 6 Fr Safe-T-Centesis catheter was introduced. Thoracentesis was performed. The catheter was removed and a dressing applied. FINDINGS: A total of approximately 1.3 liters of straw colored fluid  was removed. IMPRESSION: Successful ultrasound guided right sided therapeutic thoracentesis yielding 1.3 liters of pleural fluid. Performed by: Reagan Camera Electronically Signed   By: Erica Hau M.D.   On: 12/12/2023 10:54    Procedures Procedures    Medications Ordered in ED Medications - No data to display  ED Course/ Medical Decision Making/ A&P                                 Medical Decision Making Amount and/or Complexity of Data Reviewed Radiology: ordered.   This patient presents to the ED for concern of fall, low back pain, pelvis pain differential diagnosis includes fracture, contusion, vertebral fracture    Additional history obtained:  Additional history obtained from spouse External records from outside source obtained and reviewed including medical records    Imaging Studies ordered:  I ordered imaging studies including CT scan of head, CT scan of the lumbar spine and pelvis, x-ray of right forearm I independently visualized and interpreted imaging which showed no acute intracranial hemorrhage, no vertebral fracture, no pelvis fracture, no acute fracture of the right forearm I agree with the radiologist interpretation   Problem List / ED Course:  Patient is doing well at this time and is stable for discharge home.  Discussed with patient and wife that CT scans and x-rays demonstrated no signs of acute osseous injury.  Lesion to the pelvis is stable at this point.  He is already followed by oncology for this.  Patient had no tenderness over thoracic or cervical spine.  He had no chest wall or abdominal tenderness.  He had no other long bone or joint pain noted on physical exam.  The need for close follow-up with primary care doctor was discussed as well as strict turn precautions for any new or worsening symptoms.  Patient and spouse voiced understanding and had no additional questions.   Social Determinants of Health:  None            Final Clinical Impression(s) / ED Diagnoses Final diagnoses:  None    Rx / DC Orders ED Discharge Orders     None         Emmalene Hare 12/13/23 1240    Tonya Fredrickson, MD 12/13/23 1715

## 2023-12-13 NOTE — ED Notes (Signed)
 Patient transported to CT

## 2023-12-16 ENCOUNTER — Ambulatory Visit (HOSPITAL_COMMUNITY)
Admission: RE | Admit: 2023-12-16 | Discharge: 2023-12-16 | Disposition: A | Discharge status: Home / Self Care | Source: Ambulatory Visit | Attending: Hematology | Admitting: Hematology

## 2023-12-20 ENCOUNTER — Inpatient Hospital Stay: Admitting: Hematology

## 2023-12-20 ENCOUNTER — Inpatient Hospital Stay

## 2023-12-20 ENCOUNTER — Inpatient Hospital Stay: Attending: Oncology

## 2023-12-20 VITALS — BP 111/64 | HR 79 | Temp 96.6°F | Resp 20 | Wt 166.8 lb

## 2023-12-20 DIAGNOSIS — Z79620 Long term (current) use of immunosuppressive biologic: Secondary | ICD-10-CM | POA: Insufficient documentation

## 2023-12-20 DIAGNOSIS — C679 Malignant neoplasm of bladder, unspecified: Secondary | ICD-10-CM | POA: Insufficient documentation

## 2023-12-20 DIAGNOSIS — Z5112 Encounter for antineoplastic immunotherapy: Secondary | ICD-10-CM | POA: Diagnosis not present

## 2023-12-20 LAB — COMPREHENSIVE METABOLIC PANEL WITH GFR
ALT: 16 U/L (ref 0–44)
AST: 21 U/L (ref 15–41)
Albumin: 2.7 g/dL — ABNORMAL LOW (ref 3.5–5.0)
Alkaline Phosphatase: 64 U/L (ref 38–126)
Anion gap: 9 (ref 5–15)
BUN: 20 mg/dL (ref 8–23)
CO2: 24 mmol/L (ref 22–32)
Calcium: 9 mg/dL (ref 8.9–10.3)
Chloride: 103 mmol/L (ref 98–111)
Creatinine, Ser: 0.86 mg/dL (ref 0.61–1.24)
GFR, Estimated: >60 mL/min (ref >60)
Glucose, Bld: 131 mg/dL — ABNORMAL HIGH (ref 70–99)
Potassium: 4.2 mmol/L (ref 3.5–5.1)
Sodium: 136 mmol/L (ref 135–145)
Total Bilirubin: 0.5 mg/dL (ref 0.0–1.2)
Total Protein: 5.8 g/dL — ABNORMAL LOW (ref 6.5–8.1)

## 2023-12-20 LAB — CBC WITH DIFFERENTIAL/PLATELET
Abs Immature Granulocytes: 0.06 10*3/uL (ref 0.00–0.07)
Basophils Absolute: 0.1 10*3/uL (ref 0.0–0.1)
Basophils Relative: 1 %
Eosinophils Absolute: 0 10*3/uL (ref 0.0–0.5)
Eosinophils Relative: 0 %
HCT: 45.8 % (ref 39.0–52.0)
Hemoglobin: 15.2 g/dL (ref 13.0–17.0)
Immature Granulocytes: 1 %
Lymphocytes Relative: 4 %
Lymphs Abs: 0.4 10*3/uL — ABNORMAL LOW (ref 0.7–4.0)
MCH: 36.8 pg — ABNORMAL HIGH (ref 26.0–34.0)
MCHC: 33.2 g/dL (ref 30.0–36.0)
MCV: 110.9 fL — ABNORMAL HIGH (ref 80.0–100.0)
Monocytes Absolute: 0.3 10*3/uL (ref 0.1–1.0)
Monocytes Relative: 3 %
Neutro Abs: 9.5 10*3/uL — ABNORMAL HIGH (ref 1.7–7.7)
Neutrophils Relative %: 91 %
Platelets: 287 10*3/uL (ref 150–400)
RBC: 4.13 MIL/uL — ABNORMAL LOW (ref 4.22–5.81)
RDW: 12.9 % (ref 11.5–15.5)
WBC: 10.4 10*3/uL (ref 4.0–10.5)
nRBC: 0 % (ref 0.0–0.2)

## 2023-12-20 LAB — MAGNESIUM: Magnesium: 2.1 mg/dL (ref 1.7–2.4)

## 2023-12-20 MED ORDER — SODIUM CHLORIDE 0.9 % IV SOLN: Freq: Once | INTRAVENOUS | Status: AC

## 2023-12-20 MED ORDER — SODIUM CHLORIDE 0.9 % IV SOLN
200.0000 mg | Freq: Once | INTRAVENOUS | Status: AC
  Administered 2023-12-20: 200 mg via INTRAVENOUS
  Filled 2023-12-20: qty 8

## 2023-12-20 NOTE — Progress Notes (Signed)
Patient tolerated therapy with no complaints voiced.  Side effects with management reviewed with understanding verbalized.  Peripheral IV site clean and dry with no bruising or swelling noted at site.  Good blood return noted before and after administration of therapy.  Band aid applied.  Patient left in satisfactory condition with VSS and no s/s of distress noted.

## 2023-12-20 NOTE — Patient Instructions (Signed)
 [facility] [place] - A DEPT OF [facility]  Discharge Instructions: Thank you for choosing [facility] to provide your oncology and hematology care.  If you have a lab appointment with the Cancer Center - please note that after April 8th, 2024, all labs will be drawn in the cancer center.  You do not have to check in or register with the main entrance as you have in the past but will complete your check-in in the cancer center.  Wear comfortable clothing and clothing appropriate for easy access to any Portacath or PICC line.   We strive to give you quality time with your provider. You may need to reschedule your appointment if you arrive late (15 or more minutes).  Arriving late affects you and other patients whose appointments are after yours.  Also, if you miss three or more appointments without notifying the office, you may be dismissed from the clinic at the provider's discretion.      For prescription refill requests, have your pharmacy contact our office and allow 72 hours for refills to be completed.    Today you received the following chemotherapy and/or immunotherapy agents [name]      To help prevent nausea and vomiting after your treatment, we encourage you to take your nausea medication as directed.  BELOW ARE SYMPTOMS THAT SHOULD BE REPORTED IMMEDIATELY: *FEVER GREATER THAN 100.4 F (38 C) OR HIGHER *CHILLS OR SWEATING *NAUSEA AND VOMITING THAT IS NOT CONTROLLED WITH YOUR NAUSEA MEDICATION *UNUSUAL SHORTNESS OF BREATH *UNUSUAL BRUISING OR BLEEDING *URINARY PROBLEMS (pain or burning when urinating, or frequent urination) *BOWEL PROBLEMS (unusual diarrhea, constipation, pain near the anus) TENDERNESS IN MOUTH AND THROAT WITH OR WITHOUT PRESENCE OF ULCERS (sore throat, sores in mouth, or a toothache) UNUSUAL RASH, SWELLING OR PAIN  UNUSUAL VAGINAL DISCHARGE OR ITCHING   Items with * indicate a potential emergency and should be followed up  as soon as possible or go to the Emergency Department if any problems should occur.  Please show the CHEMOTHERAPY ALERT CARD or IMMUNOTHERAPY ALERT CARD at check-in to the Emergency Department and triage nurse.  Should you have questions after your visit or need to cancel or reschedule your appointment, please contact [facility] [place] - A DEPT OF Eligh[facility] Brideg[name] [phone]  and follow the prompts.  Office hours are 8:00 a.m. to 4:30 p.m. Monday - Friday. Please note that voicemails left after 4:00 p.m. may not be returned until the following business day.  We are closed weekends and major holidays. You have access to a nurse at all times for urgent questions. Please call the main number to the clinic [phone] and follow the prompts.  For any non-urgent questions, you may also contact your provider using MyChart. We now offer e-Visits for anyone 10 and older to request care online for non-urgent symptoms. For details visit [email or web address].   Also download the MyChart app! Go to the app store, search "MyChart", open the app, select [facility], and log in with your MyChart username and password.

## 2023-12-23 ENCOUNTER — Encounter (HOSPITAL_COMMUNITY): Payer: Self-pay

## 2023-12-23 ENCOUNTER — Ambulatory Visit (HOSPITAL_COMMUNITY)
Admission: RE | Admit: 2023-12-23 | Discharge: 2023-12-23 | Disposition: A | Discharge status: Home / Self Care | Source: Ambulatory Visit | Attending: Hematology | Admitting: Hematology

## 2023-12-23 ENCOUNTER — Ambulatory Visit (HOSPITAL_COMMUNITY)
Admission: RE | Admit: 2023-12-23 | Discharge: 2023-12-23 | Disposition: A | Discharge status: Home / Self Care | Source: Ambulatory Visit | Attending: Radiology | Admitting: Radiology

## 2023-12-23 DIAGNOSIS — C771 Secondary and unspecified malignant neoplasm of intrathoracic lymph nodes: Secondary | ICD-10-CM | POA: Diagnosis not present

## 2023-12-23 DIAGNOSIS — J90 Pleural effusion, not elsewhere classified: Secondary | ICD-10-CM | POA: Diagnosis not present

## 2023-12-23 DIAGNOSIS — R911 Solitary pulmonary nodule: Secondary | ICD-10-CM | POA: Diagnosis not present

## 2023-12-23 DIAGNOSIS — Z5112 Encounter for antineoplastic immunotherapy: Secondary | ICD-10-CM | POA: Diagnosis not present

## 2023-12-23 DIAGNOSIS — I700 Atherosclerosis of aorta: Secondary | ICD-10-CM | POA: Diagnosis not present

## 2023-12-23 DIAGNOSIS — C679 Malignant neoplasm of bladder, unspecified: Secondary | ICD-10-CM | POA: Diagnosis not present

## 2023-12-23 DIAGNOSIS — Z79620 Long term (current) use of immunosuppressive biologic: Secondary | ICD-10-CM | POA: Diagnosis not present

## 2023-12-23 DIAGNOSIS — I771 Stricture of artery: Secondary | ICD-10-CM | POA: Diagnosis not present

## 2023-12-23 DIAGNOSIS — Z48813 Encounter for surgical aftercare following surgery on the respiratory system: Secondary | ICD-10-CM | POA: Diagnosis not present

## 2023-12-23 DIAGNOSIS — C7951 Secondary malignant neoplasm of bone: Secondary | ICD-10-CM | POA: Diagnosis not present

## 2023-12-23 DIAGNOSIS — S22068A Other fracture of T7-T8 thoracic vertebra, initial encounter for closed fracture: Secondary | ICD-10-CM | POA: Diagnosis not present

## 2023-12-23 MED ORDER — LIDOCAINE HCL (PF) 2 % IJ SOLN
10.0000 mL | Freq: Once | INTRAMUSCULAR | Status: AC
  Administered 2023-12-23: 10 mL

## 2023-12-23 MED ORDER — LIDOCAINE HCL (PF) 2 % IJ SOLN
INTRAMUSCULAR | Status: AC
  Filled 2023-12-23: qty 10

## 2023-12-23 NOTE — Progress Notes (Signed)
 Patient tolerated Right sided Thoracentesis procedure well today and 1.8 Liters of pleural fluid removed. Patient verbalized understanding of discharge instructions and transported via wheelchair to xray at this time for post chest xray with no acute distress noted.

## 2023-12-23 NOTE — Procedures (Signed)
 Ultrasound-guided therapeutic right sided therapeutic thoracentesis performed yielding 1.9 liters of straw colored fluid. No immediate complications.  Follow-up chest x-ray pending. EBL is < 2 ml. Patient unable to tolerate additional fluid removal.

## 2023-12-29 ENCOUNTER — Inpatient Hospital Stay: Admitting: Dietician

## 2023-12-29 ENCOUNTER — Telehealth: Payer: Self-pay | Admitting: Dietician

## 2023-12-29 NOTE — Telephone Encounter (Signed)
 Nutrition Follow-up:   Patient with metastatic bladder cancer to lungs and bones with recurrent bilateral pleural effusions. He is receiving keytruda  q21d.   Noted 4/29 ED visit after mechanical fall at home   Spoke with wife via telephone. Patient sleeping at time of call. She reports poor appetite persists. He has increased intake of Boost to 3/day. Patient nibbles on foods. Recalls few bites of pintos and cornbread last night. Had a single chicken wing and couple bites of mashed potatoes the prior evening. Pt daughter purchased wt gain protein powder. Wife says she added this to Boost, however this was too sweet for patient. She is asking what else she can try mixing this with.   Medications: reviewed   Labs: 5/6 - glucose 131, albumin 2.7  Anthropometrics: Last wt 166 lb 12.8 oz on 5/6 - stable  4/15 - 166 lb 3/24 - 166 lb   NUTRITION DIAGNOSIS: Unintended wt loss - stable   INTERVENTION:  Continue 3 Boost Plus Suggested mixing choc protein powder with water /coconut water , ice cream, milk Encourage nibbles of high calorie high protein foods throughout the day     MONITORING, EVALUATION, GOAL: wt trends, intake   NEXT VISIT: To be scheduled as needed

## 2023-12-30 ENCOUNTER — Ambulatory Visit (HOSPITAL_COMMUNITY)
Admission: RE | Admit: 2023-12-30 | Discharge: 2023-12-30 | Disposition: A | Discharge status: Home / Self Care | Source: Ambulatory Visit | Attending: Student | Admitting: Student

## 2023-12-30 ENCOUNTER — Other Ambulatory Visit (HOSPITAL_COMMUNITY)

## 2023-12-30 ENCOUNTER — Ambulatory Visit (HOSPITAL_COMMUNITY)
Admission: RE | Admit: 2023-12-30 | Discharge: 2023-12-30 | Disposition: A | Discharge status: Home / Self Care | Source: Ambulatory Visit | Attending: Hematology | Admitting: Hematology

## 2023-12-30 DIAGNOSIS — Z48813 Encounter for surgical aftercare following surgery on the respiratory system: Secondary | ICD-10-CM | POA: Diagnosis not present

## 2023-12-30 DIAGNOSIS — J90 Pleural effusion, not elsewhere classified: Secondary | ICD-10-CM | POA: Insufficient documentation

## 2023-12-30 DIAGNOSIS — R9389 Abnormal findings on diagnostic imaging of other specified body structures: Secondary | ICD-10-CM | POA: Diagnosis not present

## 2023-12-30 MED ORDER — LIDOCAINE HCL (PF) 2 % IJ SOLN
INTRAMUSCULAR | Status: AC
  Filled 2023-12-30: qty 10

## 2023-12-30 MED ORDER — LIDOCAINE HCL (PF) 2 % IJ SOLN
10.0000 mL | Freq: Once | INTRAMUSCULAR | Status: AC
  Administered 2023-12-30: 10 mL

## 2023-12-30 NOTE — Procedures (Signed)
 PROCEDURE SUMMARY:  Successful US  guided right thoracentesis. Yielded 1.1 liters of AIR AND FLUID in the setting of previously known hydropneumothorax. Pt tolerated procedure well. No immediate complications.  Specimen was not sent for labs. CXR ordered.  EBL < 5 mL  Lillian Rein PA-C 12/30/2023 10:05 AM

## 2023-12-30 NOTE — Progress Notes (Signed)
 Patient tolerated Right sided Thoracentesis procedure well today and 1.1 Liters of yellow pleural fluid removed. Patient verbalized understanding of discharge instructions and transported via wheelchair to xray at this time for post chest xray with no acute distress noted.

## 2024-01-02 ENCOUNTER — Other Ambulatory Visit: Payer: Self-pay | Admitting: *Deleted

## 2024-01-03 ENCOUNTER — Ambulatory Visit (HOSPITAL_COMMUNITY)
Admission: RE | Admit: 2024-01-03 | Discharge: 2024-01-03 | Disposition: A | Discharge status: Home / Self Care | Source: Ambulatory Visit | Attending: Hematology | Admitting: Hematology

## 2024-01-03 DIAGNOSIS — C771 Secondary and unspecified malignant neoplasm of intrathoracic lymph nodes: Secondary | ICD-10-CM

## 2024-01-03 DIAGNOSIS — C679 Malignant neoplasm of bladder, unspecified: Secondary | ICD-10-CM

## 2024-01-03 DIAGNOSIS — J90 Pleural effusion, not elsewhere classified: Secondary | ICD-10-CM | POA: Diagnosis not present

## 2024-01-03 DIAGNOSIS — R911 Solitary pulmonary nodule: Secondary | ICD-10-CM | POA: Diagnosis not present

## 2024-01-03 DIAGNOSIS — C7951 Secondary malignant neoplasm of bone: Secondary | ICD-10-CM | POA: Diagnosis not present

## 2024-01-03 DIAGNOSIS — S22068A Other fracture of T7-T8 thoracic vertebra, initial encounter for closed fracture: Secondary | ICD-10-CM | POA: Diagnosis not present

## 2024-01-03 MED ORDER — IOHEXOL 300 MG/ML  SOLN
100.0000 mL | Freq: Once | INTRAMUSCULAR | Status: AC | PRN
  Administered 2024-01-03: 100 mL via INTRAVENOUS

## 2024-01-04 ENCOUNTER — Ambulatory Visit (HOSPITAL_COMMUNITY)
Admission: RE | Admit: 2024-01-04 | Discharge: 2024-01-04 | Disposition: A | Discharge status: Home / Self Care | Source: Ambulatory Visit | Attending: Student | Admitting: Student

## 2024-01-04 ENCOUNTER — Encounter (HOSPITAL_COMMUNITY): Payer: Self-pay

## 2024-01-04 ENCOUNTER — Ambulatory Visit (HOSPITAL_COMMUNITY)
Admission: RE | Admit: 2024-01-04 | Discharge: 2024-01-04 | Disposition: A | Discharge status: Home / Self Care | Source: Ambulatory Visit | Attending: Hematology | Admitting: Hematology

## 2024-01-04 DIAGNOSIS — S22068A Other fracture of T7-T8 thoracic vertebra, initial encounter for closed fracture: Secondary | ICD-10-CM | POA: Diagnosis not present

## 2024-01-04 DIAGNOSIS — C7951 Secondary malignant neoplasm of bone: Secondary | ICD-10-CM | POA: Diagnosis not present

## 2024-01-04 DIAGNOSIS — Z5112 Encounter for antineoplastic immunotherapy: Secondary | ICD-10-CM | POA: Diagnosis not present

## 2024-01-04 DIAGNOSIS — J90 Pleural effusion, not elsewhere classified: Secondary | ICD-10-CM | POA: Diagnosis not present

## 2024-01-04 DIAGNOSIS — C679 Malignant neoplasm of bladder, unspecified: Secondary | ICD-10-CM | POA: Diagnosis not present

## 2024-01-04 DIAGNOSIS — C771 Secondary and unspecified malignant neoplasm of intrathoracic lymph nodes: Secondary | ICD-10-CM | POA: Diagnosis not present

## 2024-01-04 DIAGNOSIS — R911 Solitary pulmonary nodule: Secondary | ICD-10-CM | POA: Diagnosis not present

## 2024-01-04 DIAGNOSIS — Z79620 Long term (current) use of immunosuppressive biologic: Secondary | ICD-10-CM | POA: Diagnosis not present

## 2024-01-04 MED ORDER — LIDOCAINE HCL (PF) 2 % IJ SOLN
10.0000 mL | Freq: Once | INTRAMUSCULAR | Status: AC
  Administered 2024-01-04: 10 mL

## 2024-01-04 NOTE — Procedures (Signed)
 PROCEDURE SUMMARY:  Successful US  guided right thoracentesis. Yielded 1.5 L of clear yellow fluid. Pt tolerated procedure well. No immediate complications.  Specimen not sent for labs. CXR ordered; no post-procedure pneumothorax identified.   EBL < 2 mL  Fawn Hooks, NP 01/04/2024 2:22 PM

## 2024-01-04 NOTE — Progress Notes (Signed)
 Patient tolerated Right sided Thoracentesis procedure well today and 1.5 Liters of clear yellow pleural fluid removed. Patient verbalized understanding of discharge instructions and transported via wheelchair to xray at this time for post chest xray with no acute distress noted.

## 2024-01-06 ENCOUNTER — Ambulatory Visit (HOSPITAL_COMMUNITY)

## 2024-01-10 ENCOUNTER — Inpatient Hospital Stay

## 2024-01-10 ENCOUNTER — Inpatient Hospital Stay (HOSPITAL_BASED_OUTPATIENT_CLINIC_OR_DEPARTMENT_OTHER): Admitting: Hematology

## 2024-01-10 VITALS — BP 108/65 | HR 86 | Temp 97.0°F | Resp 18

## 2024-01-10 VITALS — BP 102/79 | HR 89 | Temp 98.4°F | Resp 22 | Wt 162.0 lb

## 2024-01-10 DIAGNOSIS — Z5112 Encounter for antineoplastic immunotherapy: Secondary | ICD-10-CM | POA: Diagnosis not present

## 2024-01-10 DIAGNOSIS — C679 Malignant neoplasm of bladder, unspecified: Secondary | ICD-10-CM

## 2024-01-10 DIAGNOSIS — Z79620 Long term (current) use of immunosuppressive biologic: Secondary | ICD-10-CM | POA: Diagnosis not present

## 2024-01-10 LAB — COMPREHENSIVE METABOLIC PANEL WITH GFR
ALT: 11 U/L (ref 0–44)
AST: 15 U/L (ref 15–41)
Albumin: 2.6 g/dL — ABNORMAL LOW (ref 3.5–5.0)
Alkaline Phosphatase: 61 U/L (ref 38–126)
Anion gap: 9 (ref 5–15)
BUN: 27 mg/dL — ABNORMAL HIGH (ref 8–23)
CO2: 26 mmol/L (ref 22–32)
Calcium: 9 mg/dL (ref 8.9–10.3)
Chloride: 105 mmol/L (ref 98–111)
Creatinine, Ser: 0.85 mg/dL (ref 0.61–1.24)
GFR, Estimated: >60 mL/min (ref >60)
Glucose, Bld: 122 mg/dL — ABNORMAL HIGH (ref 70–99)
Potassium: 4.3 mmol/L (ref 3.5–5.1)
Sodium: 140 mmol/L (ref 135–145)
Total Bilirubin: 0.7 mg/dL (ref 0.0–1.2)
Total Protein: 5.7 g/dL — ABNORMAL LOW (ref 6.5–8.1)

## 2024-01-10 LAB — CBC WITH DIFFERENTIAL/PLATELET
Abs Immature Granulocytes: 0.03 10*3/uL (ref 0.00–0.07)
Basophils Absolute: 0.1 10*3/uL (ref 0.0–0.1)
Basophils Relative: 1 %
Eosinophils Absolute: 0.7 10*3/uL — ABNORMAL HIGH (ref 0.0–0.5)
Eosinophils Relative: 6 %
HCT: 46.3 % (ref 39.0–52.0)
Hemoglobin: 15.3 g/dL (ref 13.0–17.0)
Immature Granulocytes: 0 %
Lymphocytes Relative: 9 %
Lymphs Abs: 1 10*3/uL (ref 0.7–4.0)
MCH: 36.6 pg — ABNORMAL HIGH (ref 26.0–34.0)
MCHC: 33 g/dL (ref 30.0–36.0)
MCV: 110.8 fL — ABNORMAL HIGH (ref 80.0–100.0)
Monocytes Absolute: 0.8 10*3/uL (ref 0.1–1.0)
Monocytes Relative: 8 %
Neutro Abs: 8.3 10*3/uL — ABNORMAL HIGH (ref 1.7–7.7)
Neutrophils Relative %: 76 %
Platelets: 277 10*3/uL (ref 150–400)
RBC: 4.18 MIL/uL — ABNORMAL LOW (ref 4.22–5.81)
RDW: 12.6 % (ref 11.5–15.5)
WBC: 11 10*3/uL — ABNORMAL HIGH (ref 4.0–10.5)
nRBC: 0 % (ref 0.0–0.2)

## 2024-01-10 LAB — MAGNESIUM: Magnesium: 2.2 mg/dL (ref 1.7–2.4)

## 2024-01-10 LAB — TSH: TSH: 3.958 u[IU]/mL (ref 0.350–4.500)

## 2024-01-10 MED ORDER — SODIUM CHLORIDE 0.9 % IV SOLN
200.0000 mg | Freq: Once | INTRAVENOUS | Status: AC
  Administered 2024-01-10: 200 mg via INTRAVENOUS
  Filled 2024-01-10: qty 8

## 2024-01-10 MED ORDER — SODIUM CHLORIDE 0.9 % IV SOLN: Freq: Once | INTRAVENOUS | Status: AC

## 2024-01-10 MED ORDER — MEGESTROL ACETATE 400 MG/10ML PO SUSP: 400.0000 mg | Freq: Two times a day (BID) | ORAL | 3 refills | Status: DC

## 2024-01-10 NOTE — Progress Notes (Signed)
 Patient presents today for Keytruda  infusion. Patient is in satisfactory condition with no new complaints voiced.  Vital signs are stable.  Labs reviewed by Dr. Cheree Cords during the office visit and all labs are within treatment parameters.  We will proceed with treatment per MD orders.   Peripheral IV started with good blood return pre and post infusion.  Treatment given today per MD orders. Tolerated infusion without adverse affects. Vital signs stable. No complaints at this time. Discharged from clinic via wheelchair in stable condition. Alert and oriented x 3. F/U with [facility] as scheduled.

## 2024-01-10 NOTE — Patient Instructions (Signed)
 [facility] [place] - A DEPT OF [facility]  Discharge Instructions: Thank you for choosing [facility] to provide your oncology and hematology care.  If you have a lab appointment with the Cancer Center - please note that after April 8th, 2024, all labs will be drawn in the cancer center.  You do not have to check in or register with the main entrance as you have in the past but will complete your check-in in the cancer center.  Wear comfortable clothing and clothing appropriate for easy access to any Portacath or PICC line.   We strive to give you quality time with your provider. You may need to reschedule your appointment if you arrive late (15 or more minutes).  Arriving late affects you and other patients whose appointments are after yours.  Also, if you miss three or more appointments without notifying the office, you may be dismissed from the clinic at the provider's discretion.      For prescription refill requests, have your pharmacy contact our office and allow 72 hours for refills to be completed.    Today you received the following chemotherapy and/or immunotherapy agents Keytruda      To help prevent nausea and vomiting after your treatment, we encourage you to take your nausea medication as directed.  BELOW ARE SYMPTOMS THAT SHOULD BE REPORTED IMMEDIATELY: *FEVER GREATER THAN 100.4 F (38 C) OR HIGHER *CHILLS OR SWEATING *NAUSEA AND VOMITING THAT IS NOT CONTROLLED WITH YOUR NAUSEA MEDICATION *UNUSUAL SHORTNESS OF BREATH *UNUSUAL BRUISING OR BLEEDING *URINARY PROBLEMS (pain or burning when urinating, or frequent urination) *BOWEL PROBLEMS (unusual diarrhea, constipation, pain near the anus) TENDERNESS IN MOUTH AND THROAT WITH OR WITHOUT PRESENCE OF ULCERS (sore throat, sores in mouth, or a toothache) UNUSUAL RASH, SWELLING OR PAIN  UNUSUAL VAGINAL DISCHARGE OR ITCHING   Items with * indicate a potential emergency and should be followed up  as soon as possible or go to the Emergency Department if any problems should occur.  Please show the CHEMOTHERAPY ALERT CARD or IMMUNOTHERAPY ALERT CARD at check-in to the Emergency Department and triage nurse.  Should you have questions after your visit or need to cancel or reschedule your appointment, please contact [facility]  - A DEPT OF [facility] [phone]  and follow the prompts.  Office hours are 8:00 a.m. to 4:30 p.m. Monday - Friday. Please note that voicemails left after 4:00 p.m. may not be returned until the following business day.  We are closed weekends and major holidays. You have access to a nurse at all times for urgent questions. Please call the main number to the clinic [phone] and follow the prompts.  For any non-urgent questions, you may also contact your provider using MyChart. We now offer e-Visits for anyone 30 and older to request care online for non-urgent symptoms. For details visit [email or web address].   Also download the MyChart app! Go to the app store, search "MyChart", open the app, select [place], and log in with your MyChart username and password.

## 2024-01-10 NOTE — Progress Notes (Signed)
 Patient has been examined by Dr. Ellin Saba. Vital signs and labs have been reviewed by MD - ANC, Creatinine, LFTs, hemoglobin, and platelets are within treatment parameters per M.D. - pt may proceed with treatment.  Primary RN and pharmacy notified.

## 2024-01-10 NOTE — Progress Notes (Signed)
 [facility] [address] [place] [address]    Clinic Day:  01/10/2024  Referring physician: Kathyleen Parkins, MD  Patient Care Team: Kathyleen Parkins, MD as PCP - General (Internal Medicine) Tammie Fall, MD as PCP - Electrophysiology (Cardiology) Paulett Boros, MD as Medical Oncologist (Medical Oncology) Gerhard Knuckles, RN as Oncology Nurse Navigator (Medical Oncology)   ASSESSMENT & PLAN:   Assessment: 1.  Metastatic urothelial carcinoma of the bladder to the lungs and bones: - Neoadjuvant chemotherapy with gemcitabine  and cisplatin  cycle 1 on 05/02/2017 - Cystoprostatectomy, bilateral lymphadenectomy by Dr. Secundino Dach on 06/29/2017, pathology T3b N0, 0/13 lymph nodes - Developed metastatic disease in April 2019, biopsy of the right ischium on 12/08/2017 with poorly differentiated carcinoma consistent with metastatic urothelial carcinoma. - XRT to the pelvis completed on 01/16/2018 - Carboplatin  and gemcitabine  6 cycles from 01/30/2018 through 05/22/2018 - Pembrolizumab  every 3 weeks started on 06/21/2018, discontinued in April 2020 due to skin toxicity - XRT to the right paratracheal lymph node, 50 Gray in 10 fractions completed on 04/23/2020 - XRT to the intrathoracic lymph node completed on 11/27/2021  - 12/20/2022: Right thoracentesis with 1.8 L removed - 12/21/2022: Left thoracentesis with 700 mL fluid removed, cytology negative. - Unclear etiology of recurrent effusions, exudative. - 2D echocardiogram: Normal LVEF with grade 1 diastolic dysfunction. - CT angiogram 12/20/2022: Negative for PE.  Multifocal parenchymal opacities.  No adenopathy. - Keytruda  started on 01/07/2023.   2.  Social/family history: - Lives at home with his wife. - Worked for [employer].  Quit smoking 40 years ago. - No family history of malignancies.    Plan: 1.  Metastatic urothelial carcinoma of the bladder to the lungs and bones: - He does not have any  immunotherapy related side effects.  Chronic cough is stable with Robitussin. - Reviewed CT CAP from 01/03/2024: Stable destructive lesion in the right inferior pubic ramus and stable 9 mm nonspecific left upper lobe nodule.  Annual age-indeterminate compression fracture of the T7 superior endplate. - He reported a fall about a month ago when he slipped at home.  He does not report any pain at the T7 fracture site. - Labs today: Albumin low at 2.6.  Rest of LFTs are normal.  Creatinine is normal.  CBC was grossly normal.  TSH is 3.9. - Continue Keytruda  today and every 3 weeks.  RTC 6 weeks for follow-up.   2.  Recurrent right pleural effusion: - He is having thoracentesis almost once a week, most number of times on the right side and occasionally on the left side.  We have again discussed the role of Pleurx catheter.  He has declined.   3.  Loss of appetite/weight loss: - He lost 4 pounds since last visit.  He is drinking 3 cans of boost high-protein per day.  Appetite is low.  He will start back on Megace  400 mg twice daily.  We have sent the prescription.   4.  Immunotherapy induced skin rash: - Continue prednisone  10 mg daily.  Use hydroxyzine  for itching as needed.  This is well-controlled.   5.  Cancer-related fatigue: - Continue Ritalin  10 mg in the mornings as needed.    Orders Placed This Encounter  Procedures   Magnesium     Standing Status:   Future    Expected Date:   03/13/2024    Expiration Date:   03/13/2025   CBC with Differential    Standing Status:   Future  Expected Date:   03/13/2024    Expiration Date:   03/13/2025   Comprehensive metabolic panel    Standing Status:   Future    Expected Date:   03/13/2024    Expiration Date:   03/13/2025   T4    Standing Status:   Future    Expected Date:   03/13/2024    Expiration Date:   03/13/2025   TSH    Standing Status:   Future    Expected Date:   03/13/2024    Expiration Date:   03/13/2025   Magnesium     Standing Status:    Future    Expected Date:   04/03/2024    Expiration Date:   04/03/2025   CBC with Differential    Standing Status:   Future    Expected Date:   04/03/2024    Expiration Date:   04/03/2025   Comprehensive metabolic panel    Standing Status:   Future    Expected Date:   04/03/2024    Expiration Date:   04/03/2025   T4    Standing Status:   Future    Expected Date:   04/03/2024    Expiration Date:   04/03/2025   TSH    Standing Status:   Future    Expected Date:   04/03/2024    Expiration Date:   04/03/2025      Hurman Maiden R Teague,acting as a scribe for Paulett Boros, MD.,have documented all relevant documentation on the behalf of Paulett Boros, MD,as directed by  Paulett Boros, MD while in the presence of Paulett Boros, MD.  I, Paulett Boros MD, have reviewed the above documentation for accuracy and completeness, and I agree with the above.      Paulett Boros, MD   5/27/20251:27 PM  CHIEF COMPLAINT:   Diagnosis: Metastatic urothelial carcinoma of the bladder    Cancer Staging  No matching staging information was found for the patient.    Prior Therapy: 1. Neoadjuvant gemcitabine /cisplatin  on 05/02/17 (one cycle) 2. Cystoprostatectomy and bilateral lymphadenectomy on 06/29/2017 (Dr. Secundino Dach) 3. Radiation therapy to the pelvic lesion 01/02/18 - 01/16/18 4. Carboplatin  and gemcitabine , 6 cycles 02/06/18 - 05/22/18 5. Pembrolizumab  06/21/18 - 12/13/18 6. Radiation therapy to RUL and right paratracheal nodes 04/09/20 - 04/23/20 7. Radiation therapy to intrathoracic lymph nodes 11/16/21 - 11/27/21  Current Therapy:  pembrolizumab     HISTORY OF PRESENT ILLNESS:   Oncology History  Stage IV malignant neoplasm of urinary bladder /with mets to the bones and lungs  04/20/2017 Initial Diagnosis   Malignant neoplasm of urinary bladder (HCC)   01/30/2018 - 05/22/2018 Chemotherapy   The patient had palonosetron  (ALOXI ) injection 0.25 mg, 0.25 mg, Intravenous,  Once, 6  of 6 cycles Administration: 0.25 mg (01/30/2018), 0.25 mg (02/20/2018), 0.25 mg (03/13/2018), 0.25 mg (04/03/2018), 0.25 mg (05/01/2018), 0.25 mg (05/22/2018) CARBOplatin  (PARAPLATIN ) 400 mg in sodium chloride  0.9 % 250 mL chemo infusion, 400 mg (100 % of original dose 400 mg), Intravenous,  Once, 6 of 6 cycles Dose modification: 400 mg (original dose 400 mg, Cycle 1),   (original dose 400 mg, Cycle 2, Reason: Patient Age), 410 mg (original dose 400 mg, Cycle 6) Administration: 400 mg (01/30/2018), 400 mg (02/20/2018), 400 mg (03/13/2018), 410 mg (04/03/2018), 410 mg (05/01/2018), 410 mg (05/22/2018) gemcitabine  (GEMZAR ) 2,128 mg in sodium chloride  0.9 % 250 mL chemo infusion, 1,000 mg/m2 = 2,128 mg, Intravenous,  Once, 6 of 6 cycles Dose modification: 800 mg/m2 (original dose 1,000 mg/m2,  Cycle 4, Reason: Provider Judgment), 800 mg/m2 (original dose 1,000 mg/m2, Cycle 4, Reason: Provider Judgment) Administration: 2,128 mg (01/30/2018), 2,128 mg (02/06/2018), 2,128 mg (02/20/2018), 2,128 mg (02/27/2018), 2,128 mg (03/13/2018), 1,710 mg (04/03/2018), 1,710 mg (04/10/2018), 1,710 mg (05/01/2018), 1,710 mg (05/22/2018) fosaprepitant  (EMEND ) 150 mg, dexamethasone  (DECADRON ) 12 mg in sodium chloride  0.9 % 145 mL IVPB, , Intravenous,  Once, 6 of 6 cycles Administration:  (01/30/2018),  (02/20/2018),  (03/13/2018),  (04/03/2018),  (05/01/2018),  (05/22/2018)  for chemotherapy treatment.    01/07/2023 -  Chemotherapy   Patient is on Treatment Plan : BLADDER Pembrolizumab  (200) q21d     Bladder cancer (HCC)  06/28/2017 Initial Diagnosis   Bladder cancer (HCC)   06/21/2018 - 12/13/2018 Chemotherapy   The patient had pembrolizumab  (KEYTRUDA ) 200 mg in sodium chloride  0.9 % 50 mL chemo infusion, 200 mg, Intravenous, Once, 8 of 9 cycles Administration: 200 mg (06/21/2018), 200 mg (07/12/2018), 200 mg (08/02/2018), 200 mg (08/23/2018), 200 mg (09/13/2018), 200 mg (10/04/2018), 200 mg (11/22/2018), 200 mg (12/13/2018)  for chemotherapy treatment.        INTERVAL HISTORY:   Monti is a 83 y.o. male presenting to clinic today for follow up of Metastatic urothelial carcinoma of the bladder. He was last seen by me on 11/29/23.  Today, he states that he is doing well overall. His appetite level is at 0%. His energy level is at 0%. Awab is accompanied by his wife.   Boen states he fell 1 month ago and notes lower back pain. He denies any fracture drom the fall when he presented to the ED after.   He is receiving weekly thoracentesis and has had right thoracentesis the last 3 times. He notes improvement in breathing after thoracentesis. He does not wish to have a PleurX catheter at this time.  Lonnie has lost 4 pounds since his last visit. He does not eat much, but drinks 3 cans of Boost a day. He has not started drinking protein powder mixed with milk. Elbie is not taking Megace . He is taking prednisone  as prescribed and Ritalin  prn.    PAST MEDICAL HISTORY:  0 Past Medical History: Past Medical History:  Diagnosis Date   Bladder cancer (HCC)    Bone cancer (HCC)    BPH (benign prostatic hypertrophy)    Dysuria    GERD (gastroesophageal reflux disease)    History of adenomatous polyp of colon    tubular adenoma 06/ 2018   History of bladder cancer 12/2014   urologist-  dr Domingo Friend--  dx High Grade TCC without stromal invasion s/p TURBT and intravesical BCG tx/  recurrent 07/2015 (pTa) TCC  repear intravesical BCG tx    History of hiatal hernia    History of urethral stricture    Nocturia     Surgical History: Past Surgical History:  Procedure Laterality Date   CARDIAC CATHETERIZATION  1997   normal coronary arteries   CARDIOVASCULAR STRESS TEST  12-14-2005   Low risk perfusion study/  very small scar in the inferior septum from mid ventricle to apex,  no ischemia/  mild inferior septal hypokinesis/  ef 58%   CATARACT EXTRACTION W/ INTRAOCULAR LENS  IMPLANT, BILATERAL  2011   COLONOSCOPY  last one 06/ 2018   CYSTOSCOPY  WITH BIOPSY N/A 08/12/2015   Procedure: CYSTOSCOPY WITH BIOPSY AND FULGERATION;  Surgeon: Bart Born, MD;  Location: [facility];  Service: Urology;  Laterality: N/A;   CYSTOSCOPY WITH INJECTION N/A 06/29/2017   Procedure:  CYSTOSCOPY WITH INJECTION OF INDOCYANINE GREEN DYE;  Surgeon: Osborn Blaze, MD;  Location: [facility];  Service: Urology;  Laterality: N/A;   CYSTOSCOPY WITH URETHRAL DILATATION N/A 03/21/2017   Procedure: CYSTOSCOPY;  Surgeon: Bart Born, MD;  Location: [facility];  Service: Urology;  Laterality: N/A;   ESOPHAGOGASTRODUODENOSCOPY  12-05-2014   IR IMAGING GUIDED PORT INSERTION  02/07/2018   IR NEPHROSTOMY PLACEMENT LEFT  12/25/2021   IR REMOVAL TUN ACCESS W/ PORT W/O FL MOD SED  04/14/2018   TRANSTHORACIC ECHOCARDIOGRAM  01-31-2008   nomral LVF,  ef 55-65%/  mild pulmonic stenosis without regurg.,  peak transpulomonic valve gradient   TRANSURETHRAL RESECTION OF BLADDER TUMOR N/A 12/31/2014   Procedure: TRANSURETHRAL RESECTION OF BLADDER TUMOR (TURBT);  Surgeon: Jock Muller, MD;  Location: [facility];  Service: Urology;  Laterality: N/A;   TRANSURETHRAL RESECTION OF BLADDER TUMOR N/A 03/21/2017   Procedure: POSSIBLE TRANSURETHRAL RESECTION OF BLADDER TUMOR (TURBT);  Surgeon: Bart Born, MD;  Location: [facility];  Service: Urology;  Laterality: N/A;    Social History: Social History   Socioeconomic History   Marital status: Married    Spouse name: Not on file   Number of children: 1   Years of education: Not on file   Highest education level: Not on file  Occupational History   Occupation: Retired  Tobacco Use   Smoking status: Former    Current packs/day: 0.00    Average packs/day: 1 pack/day for 34.0 years (34.0 ttl pk-yrs)    Types: Cigarettes    Start date: 12/27/1954    Quit date: 12/26/1988    Years since quitting: 35.0   Smokeless tobacco: Never  Vaping Use   Vaping  status: Never Used  Substance and Sexual Activity   Alcohol use: Yes    Alcohol/week: 6.0 standard drinks of alcohol    Types: 6 Cans of beer per week    Comment: BEER   Drug use: No   Sexual activity: Not Currently  Other Topics Concern   Not on file  Social History Narrative   Not on file   Social Drivers of Health   Financial Resource Strain: Not on file  Food Insecurity: No Food Insecurity (12/20/2022)   Hunger Vital Sign    Worried About Running Out of Food in the Last Year: Never true    Ran Out of Food in the Last Year: Never true  Transportation Needs: No Transportation Needs (12/20/2022)   PRAPARE - Administrator, Civil Service (Medical): No    Lack of Transportation (Non-Medical): No  Physical Activity: Not on file  Stress: Not on file  Social Connections: Unknown (12/28/2021)   [facility]   Social Network    Social Network: Not on file  Intimate Partner Violence: Not At Risk (12/20/2022)   Humiliation, Afraid, Rape, and Kick questionnaire    Fear of Current or Ex-Partner: No    Emotionally Abused: No    Physically Abused: No    Sexually Abused: No    Family History: Family History  Problem Relation Age of Onset   Alcoholism Father    Colon cancer Neg Hx    Colon polyps Neg Hx    Diabetes Neg Hx    Kidney disease Neg Hx    Esophageal cancer Neg Hx    Heart disease Neg Hx    Gallbladder disease Neg Hx    Allergic rhinitis Neg Hx  Angioedema Neg Hx    Asthma Neg Hx    Atopy Neg Hx    Eczema Neg Hx    Immunodeficiency Neg Hx    Urticaria Neg Hx     Current Medications:  Current Outpatient Medications:    acetaminophen  (TYLENOL ) 500 MG tablet, Take 1,000 mg by mouth every 6 (six) hours as needed for mild pain, fever, moderate pain or headache., Disp: , Rfl:    apixaban  (ELIQUIS ) 5 MG TABS tablet, Take 1 tablet (5 mg total) by mouth 2 (two) times daily., Disp: 28 tablet, Rfl: 0   apixaban  (ELIQUIS ) 5 MG TABS  tablet, Take 1 tablet (5 mg total) by mouth 2 (two) times daily., Disp: 28 tablet, Rfl: 0   betamethasone  dipropionate 0.05 % cream, Apply topically 2 (two) times daily., Disp: 60 g, Rfl: 2   budesonide -formoterol  (SYMBICORT ) 160-4.5 MCG/ACT inhaler, Inhale 2 puffs into the lungs 2 (two) times daily., Disp: 1 each, Rfl: 6   chlorpheniramine-HYDROcodone  (TUSSIONEX) 10-8 MG/5ML, Take 5 mLs by mouth every 12 (twelve) hours as needed (severe cough)., Disp: 70 mL, Rfl: 0   dextromethorphan -guaiFENesin  (MUCINEX  DM) 30-600 MG 12hr tablet, Take 1 tablet by mouth 2 (two) times daily., Disp: , Rfl:    hydrOXYzine  (ATARAX ) 25 MG tablet, Take 1 tablet (25 mg total) by mouth 3 (three) times daily as needed., Disp: 90 tablet, Rfl: 0   levothyroxine  (SYNTHROID ) 75 MCG tablet, Take 75 mcg by mouth daily., Disp: , Rfl:    methenamine  (HIPREX ) 1 g tablet, Take 1 tablet (1 g total) by mouth 2 (two) times daily., Disp: 60 tablet, Rfl: 11   methylphenidate  (RITALIN ) 10 MG tablet, TAKE 1 TAB DAILY IF NEEDED. MAX 3 TABS/DAY., Disp: 30 tablet, Rfl: 0   metoprolol  tartrate (LOPRESSOR ) 25 MG tablet, Take 1 tablet (25 mg total) by mouth 2 (two) times daily., Disp: 60 tablet, Rfl: 2   pantoprazole  (PROTONIX ) 40 MG tablet, Take 1 tablet (40 mg total) by mouth 2 (two) times daily., Disp: 60 tablet, Rfl: 0   predniSONE  (DELTASONE ) 10 MG tablet, Take 1 tablet (10 mg total) by mouth daily with breakfast., Disp: 90 tablet, Rfl: 3   SSD 1 % cream, Apply topically 2 (two) times daily., Disp: , Rfl:    Vitamin D, Ergocalciferol, (DRISDOL) 1.25 MG (50000 UNIT) CAPS capsule, Take 50,000 Units by mouth once a week., Disp: , Rfl:    megestrol  (MEGACE ) 400 MG/10ML suspension, Take 10 mLs (400 mg total) by mouth 2 (two) times daily., Disp: 480 mL, Rfl: 3  Current Facility-Administered Medications:    omalizumab  (XOLAIR ) prefilled syringe 300 mg, 300 mg, Subcutaneous, Q14 Days, Rochester Chuck, MD, 300 mg at 11/24/22 1610    Allergies: Allergies  Allergen Reactions   Ciprofloxacin  Hives   Zofran  [Ondansetron ] Rash    REVIEW OF SYSTEMS:   Review of Systems  Constitutional:  Negative for chills, fatigue and fever.  HENT:   Negative for lump/mass, mouth sores, nosebleeds, sore throat and trouble swallowing.   Eyes:  Negative for eye problems.  Respiratory:  Positive for cough and shortness of breath.   Cardiovascular:  Negative for chest pain, leg swelling and palpitations.  Gastrointestinal:  Negative for abdominal pain, constipation, diarrhea, nausea and vomiting.  Genitourinary:  Negative for bladder incontinence, difficulty urinating, dysuria, frequency, hematuria and nocturia.   Musculoskeletal:  Positive for back pain (5/10 severity). Negative for arthralgias, flank pain, myalgias and neck pain.  Skin:  Negative for itching and rash.  Neurological:  Negative for dizziness, headaches and numbness.  Hematological:  Does not bruise/bleed easily.  Psychiatric/Behavioral:  Negative for depression, sleep disturbance and suicidal ideas. The patient is not nervous/anxious.   All other systems reviewed and are negative.    VITALS:   Blood pressure 102/79, pulse 89, temperature 98.4 F (36.9 C), temperature source Oral, resp. rate (!) 22, weight 162 lb (73.5 kg), SpO2 97%.  Wt Readings from Last 3 Encounters:  01/10/24 162 lb (73.5 kg)  12/20/23 166 lb 12.8 oz (75.7 kg)  12/13/23 165 lb 5.5 oz (75 kg)    Body mass index is 22.59 kg/m.  Performance status (ECOG): 1 - Symptomatic but completely ambulatory  PHYSICAL EXAM:   Physical Exam Vitals and nursing note reviewed. Exam conducted with a chaperone present.  Constitutional:      Appearance: Normal appearance.  Cardiovascular:     Rate and Rhythm: Normal rate and regular rhythm.     Pulses: Normal pulses.     Heart sounds: Normal heart sounds.  Pulmonary:     Effort: Pulmonary effort is normal.     Breath sounds: Normal breath sounds.   Abdominal:     Palpations: Abdomen is soft. There is no hepatomegaly, splenomegaly or mass.     Tenderness: There is no abdominal tenderness.  Musculoskeletal:     Right lower leg: No edema.     Left lower leg: No edema.  Lymphadenopathy:     Cervical: No cervical adenopathy.     Right cervical: No superficial, deep or posterior cervical adenopathy.    Left cervical: No superficial, deep or posterior cervical adenopathy.     Upper Body:     Right upper body: No supraclavicular or axillary adenopathy.     Left upper body: No supraclavicular or axillary adenopathy.  Neurological:     General: No focal deficit present.     Mental Status: He is alert and oriented to person, place, and time.  Psychiatric:        Mood and Affect: Mood normal.        Behavior: Behavior normal.     LABS:      Latest Ref Rng & Units 01/10/2024   12:12 PM 12/20/2023    1:12 PM 11/29/2023   12:15 PM  CBC  WBC 4.0 - 10.5 K/uL 11.0  10.4  10.5   Hemoglobin 13.0 - 17.0 g/dL 16.1  09.6  04.5   Hematocrit 39.0 - 52.0 % 46.3  45.8  48.6   Platelets 150 - 400 K/uL 277  287  303       Latest Ref Rng & Units 01/10/2024   12:12 PM 12/20/2023    1:12 PM 11/29/2023   12:15 PM  CMP  Glucose 70 - 99 mg/dL 409  811  914   BUN 8 - 23 mg/dL 27  20  22    Creatinine 0.61 - 1.24 mg/dL 7.82  9.56  2.13   Sodium 135 - 145 mmol/L 140  136  140   Potassium 3.5 - 5.1 mmol/L 4.3  4.2  3.9   Chloride 98 - 111 mmol/L 105  103  103   CO2 22 - 32 mmol/L 26  24  25    Calcium 8.9 - 10.3 mg/dL 9.0  9.0  9.0   Total Protein 6.5 - 8.1 g/dL 5.7  5.8  6.0   Total Bilirubin 0.0 - 1.2 mg/dL 0.7  0.5  0.5   Alkaline Phos 38 - 126 U/L 61  64  54   AST 15 - 41 U/L 15  21  25    ALT 0 - 44 U/L 11  16  17       No results found for: "CEA1", "CEA" / No results found for: "CEA1", "CEA" No results found for: "PSA1" No results found for: "ZOX096" No results found for: "CAN125"  No results found for: "TOTALPROTELP", "ALBUMINELP", "A1GS",  "A2GS", "BETS", "BETA2SER", "GAMS", "MSPIKE", "SPEI" Lab Results  Component Value Date   TIBC 249 03/20/2018   FERRITIN 759 (H) 03/20/2018   IRONPCTSAT 20 (L) 03/20/2018   Lab Results  Component Value Date   LDH 96 (L) 06/21/2022     STUDIES:   DG Chest 1 View Result Date: 01/04/2024 CLINICAL DATA:  Status post thoracentesis. EXAM: CHEST  1 VIEW COMPARISON:  Chest radiograph dated 12/30/2023. FINDINGS: Interval decrease in the size of the right pleural effusion status post thoracentesis. No pneumothorax. The cardiac silhouette. No acute osseous pathology. IMPRESSION: Interval decrease in the size of the right pleural effusion status post thoracentesis. No pneumothorax. Electronically Signed   By: Angus Bark M.D.   On: 01/04/2024 15:29   US  THORACENTESIS ASP PLEURAL SPACE W/IMG GUIDE Result Date: 01/04/2024 INDICATION: Patient with a history of metastatic bladder cancer recurrent bilateral pleural effusions. Interventional radiology asked to perform a therapeutic thoracentesis. EXAM: ULTRASOUND GUIDED THORACENTESIS MEDICATIONS: 1% lidocaine  10 mL COMPLICATIONS: None immediate. PROCEDURE: An ultrasound guided thoracentesis was thoroughly discussed with the patient and questions answered. The benefits, risks, alternatives and complications were also discussed. The patient understands and wishes to proceed with the procedure. Written consent was obtained. Ultrasound was performed to localize and mark an adequate pocket of fluid in the right chest. The area was then prepped and draped in the normal sterile fashion. 1% Lidocaine  was used for local anesthesia. Under ultrasound guidance a 6 Fr Safe-T-Centesis catheter was introduced. Thoracentesis was performed. The catheter was removed and a dressing applied. FINDINGS: A total of approximately 1.5 L of clear yellow fluid was removed. IMPRESSION: Successful ultrasound guided right thoracentesis yielding 1.5 L of pleural fluid. Procedure performed by  Jetta Morrow, NP Electronically Signed   By: Creasie Doctor M.D.   On: 01/04/2024 15:12   CT CHEST ABDOMEN PELVIS W CONTRAST Result Date: 01/04/2024 EXAMINATION: CT CHEST ABDOMEN PELVIS W CONTRAST CLINICAL INDICATION: Male, 83 years old. Metastatic disease evaluation TECHNIQUE: Axial CT of the chest, abdomen, and pelvis with 100 mL Omnipaque  300 intravenous contrast. Multiplanar reformations provided. Unless otherwise specified, incidental thyroid , adrenal, renal lesions do not require dedicated imaging follow up. Additionally, any mentioned pulmonary nodules do not require dedicated imaging follow-up based on the Fleischner guidelines unless otherwise specified. Coronary calcifications are not identified unless otherwise specified. COMPARISON: 12/13/2023, 10/07/2023 FINDINGS: CHEST: The thyroid  is normal. The thoracic aorta is nonaneurysmal. Scattered atherosclerotic changes are present. The main pulmonary artery is normal in caliber. The heart is normal in size. There are coronary calcifications. There is a small hiatal hernia. There is no adenopathy. Large right and moderate left pleural effusions are seen, of which the right pleural effusion slightly larger compared to prior. There is bilateral gynecomastia. The trachea and mainstem bronchi are patent. Passive atelectasis is noted throughout the right lung. There is a stable 9 mm left upper lobe pulmonary nodule (series 2 image 17). ABDOMEN/PELVIS: The liver contains a few small cysts. There is cholelithiasis. The spleen is normal. The pancreas is normal. The adrenals are normal. The kidneys are normal other than a right renal cyst.  The abdominal aorta is normal in caliber. Scattered atherosclerotic changes are present. The patient is status post cystoprostatectomy with right sided ileal conduit urinary diversion. There is colonic diverticulosis. Large and small bowel loops are otherwise within normal limits. No free fluid or adenopathy. BONES: There is a  stable destructive lesion again noted within the right inferior pubic ramus. There is diffuse osseous demineralization. There are degenerative changes of the spine. There is a new age-indeterminate compression fracture of the superior endplate of T7. IMPRESSION: Stable destructive lesion within the right inferior pubic ramus. New age-indeterminate compression fracture of T7. Stable nonspecific 9 mm left upper lobe pulmonary nodule. Stable moderate left and enlarging large right pleural effusions. DOSE REDUCTION: All CT scans are performed using radiation dose reduction techniques, when applicable. Technical factors are evaluated and adjusted to ensure appropriate moderation of exposure. Electronically signed by: [place] Engel MD 01/04/2024 07:05 AM EDT RP Workstation: [id number]   US  THORACENTESIS ASP PLEURAL SPACE W/IMG GUIDE Result Date: 12/30/2023 INDICATION: 83 year old male with recurrent bilateral pleural effusions. Request for therapeutic thoracentesis. EXAM: ULTRASOUND GUIDED RIGHT THORACENTESIS MEDICATIONS: 10 mL 1% lidocaine  COMPLICATIONS: None immediate. PROCEDURE: An ultrasound guided thoracentesis was thoroughly discussed with the patient and questions answered. The benefits, risks, alternatives and complications were also discussed. The patient understands and wishes to proceed with the procedure. Written consent was obtained. Ultrasound was performed to localize and mark an adequate pocket of fluid in the right chest. The area was then prepped and draped in the normal sterile fashion. 1% Lidocaine  was used for local anesthesia. Under ultrasound guidance a 19 gauge, 7-cm, Yueh catheter was introduced. Thoracentesis was performed. The catheter was removed and a dressing applied. FINDINGS: A total of approximately 1.1 liters of a combination of air and thin, yellow fluid was removed. IMPRESSION: Successful ultrasound guided RIGHT thoracentesis yielding 1.1 L of pleural fluid. A significant amount of air  was also extracted. Performed by: Kacie Matthews PA-C Electronically Signed   By: Art Largo M.D.   On: 12/30/2023 13:58   DG Chest 1 View Result Date: 12/30/2023 CLINICAL DATA:  Status post thoracentesis. EXAM: CHEST  1 VIEW portable COMPARISON:  12/23/2023 x-ray and older FINDINGS: Tiny left and moderate right pleural effusion are again seen. There is some mild opacity at the right lung base as well. Stable cardiopericardial silhouette. Slight shift of the mediastinum from left-to-right. Masslike fullness along the right lung hilum is again seen. No pneumothorax identified. No edema. IMPRESSION: No significant oval change. No pneumothorax seen post thoracentesis. Electronically Signed   By: Adrianna Horde M.D.   On: 12/30/2023 10:13   US  THORACENTESIS ASP PLEURAL SPACE W/IMG GUIDE Result Date: 12/23/2023 INDICATION: 83 year old male. History of bladder cancer with recurrent bilateral pleural effusions. Request for right sided therapeutic thoracentesis. EXAM: ULTRASOUND GUIDED RIGHT SIDED THERAPEUTIC THORACENTESIS MEDICATIONS: Lidocaine  1% 10 mL COMPLICATIONS: None immediate. PROCEDURE: An ultrasound guided thoracentesis was thoroughly discussed with the patient and questions answered. The benefits, risks, alternatives and complications were also discussed. The patient understands and wishes to proceed with the procedure. Written consent was obtained. Ultrasound was performed to localize and mark an adequate pocket of fluid in the right chest. The area was then prepped and draped in the normal sterile fashion. 1% Lidocaine  was used for local anesthesia. Under ultrasound guidance a 8 Fr Safe-T-Centesis catheter was introduced. Thoracentesis was performed. The catheter was removed and a dressing applied. FINDINGS: A total of approximately 1.9 L of straw-colored fluid was removed. IMPRESSION: Successful  ultrasound guided therapeutic right-sided thoracentesis yielding 1.9 L of pleural fluid. Performed by Reagan Camera NP Electronically Signed   By: Creasie Doctor M.D.   On: 12/23/2023 12:41   DG Chest 1 View Result Date: 12/23/2023 CLINICAL DATA:  [id number] Status post thoracentesis [id number] EXAM: CHEST  1 VIEW COMPARISON:  December 12, 2023 FINDINGS: Improved aeration of the right lung with decreasing volume of the right pleural effusion, which is now moderate in volume. Right basilar atelectasis. No pneumothorax. Trace left pleural effusion. No airspace consolidation in the left lung. No cardiomegaly. Tortuous aorta with aortic atherosclerosis. No acute fracture or destructive lesion. IMPRESSION: Improved aeration of the right lung with decreasing volume of right pleural effusion, which is now moderate in volume. No pneumothorax. Electronically Signed   By: Rance Burrows M.D.   On: 12/23/2023 10:53   CT Lumbar Spine Wo Contrast Result Date: 12/13/2023 CLINICAL DATA:  Ataxia, lumbar trauma. Fall 2 days ago. Low back and coccygeal pain. EXAM: CT LUMBAR SPINE WITHOUT CONTRAST TECHNIQUE: Multidetector CT imaging of the lumbar spine was performed without intravenous contrast administration. Multiplanar CT image reconstructions were also generated. RADIATION DOSE REDUCTION: This exam was performed according to the departmental dose-optimization program which includes automated exposure control, adjustment of the mA and/or kV according to patient size and/or use of iterative reconstruction technique. COMPARISON:  Lumbar spine MRI 10/21/2017 FINDINGS: Segmentation: 5 lumbar type vertebrae. Alignment: Normal. Vertebrae: No acute fracture or suspicious lesion. Mild deformity of the left L4 transverse process, possibly reflecting a remote, healed fracture. Paraspinal and other soft tissues: Partially visualized pleural effusions. Aortic atherosclerosis. Small stones in the included portion of the gallbladder. Partially visualized postoperative changes of the bowel. Disc levels: Similar appearance of mild lumbar spondylosis and  moderate facet hypertrophy compared to the prior MRI without evidence of high-grade spinal canal stenosis. Evidence of left greater than right lateral recess stenosis at L3-4 and bilateral lateral recess stenosis at L4-5. Up to mild neural foraminal stenosis from L3-4 through L5-S1. IMPRESSION: 1. No acute osseous abnormality. 2. Lumbar disc and facet degeneration without high-grade spinal stenosis. 3. Lateral recess and mild neural foraminal stenosis as above. 4. Partially visualized pleural effusions. 5.  Aortic Atherosclerosis (ICD10-I70.0). Electronically Signed   By: Aundra Lee M.D.   On: 12/13/2023 12:21   CT PELVIS WO CONTRAST Result Date: 12/13/2023 CLINICAL DATA:  Lower back and coccygeal pain after fall 2 days ago. EXAM: CT PELVIS WITHOUT CONTRAST TECHNIQUE: Multidetector CT imaging of the pelvis was performed following the standard protocol without intravenous contrast. RADIATION DOSE REDUCTION: This exam was performed according to the departmental dose-optimization program which includes automated exposure control, adjustment of the mA and/or kV according to patient size and/or use of iterative reconstruction technique. COMPARISON:  October 07, 2023. FINDINGS: Urinary Tract:  Status post cystectomy. Bowel:  Unremarkable visualized pelvic bowel loops. Vascular/Lymphatic: Aortic atherosclerosis.  No adenopathy. Reproductive:  Status post prostatectomy. Other:  No ascites or hernia is noted. Musculoskeletal: Stable destructive lesion is again noted in right inferior pubic ramus. No acute fracture or dislocation is noted. IMPRESSION: Stable destructive lesion is again noted in right inferior pubic ramus as noted on prior studies. No acute fracture or or other bony abnormality is noted. Status post cystectomy and prostatectomy. Aortic Atherosclerosis (ICD10-I70.0). Electronically Signed   By: Rosalene Colon M.D.   On: 12/13/2023 12:20   CT Head Wo Contrast Result Date: 12/13/2023 CLINICAL DATA:   Head trauma, minor (Age >= 65y).  Fall  2 days ago. EXAM: CT HEAD WITHOUT CONTRAST TECHNIQUE: Contiguous axial images were obtained from the base of the skull through the vertex without intravenous contrast. RADIATION DOSE REDUCTION: This exam was performed according to the departmental dose-optimization program which includes automated exposure control, adjustment of the mA and/or kV according to patient size and/or use of iterative reconstruction technique. COMPARISON:  Head MRI 02/03/2021 FINDINGS: Brain: There is no evidence of an acute infarct, intracranial hemorrhage, mass, midline shift, or extra-axial fluid collection. Patchy to confluent hypodensities in the cerebral white matter are nonspecific but compatible with moderate chronic small vessel ischemic disease. 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 Mild global cerebral atrophy is within normal limits for age. Vascular: Calcified atherosclerosis at the skull base. No hyperdense vessel. Skull: No fracture or suspicious lesion. Sinuses/Orbits: Visualized paranasal sinuses and mastoid air cells are clear. Bilateral cataract extraction. Other: None. IMPRESSION: 1. No evidence of acute intracranial abnormality. 2. Moderate chronic small vessel ischemic disease. Electronically Signed   By: Aundra Lee M.D.   On: 12/13/2023 12:13   DG Forearm Right Result Date: 12/13/2023 CLINICAL DATA:  Right forearm pain after falling 2 days ago. EXAM: RIGHT FOREARM - 2 VIEW COMPARISON:  None Available. FINDINGS: The bones appear demineralized. No evidence of acute fracture or dislocation. The joint spaces appear preserved. No evidence of elbow joint effusion. There is mild fragmented spurring at the olecranon process. Scattered vascular calcifications are noted. No evidence of foreign body or soft tissue emphysema. IMPRESSION: No evidence of acute fracture or  dislocation. Electronically Signed   By: Elmon Hagedorn M.D.   On: 12/13/2023 11:31   DG Chest 1 View Result Date: 12/12/2023 CLINICAL DATA:  Status post right thoracentesis. Weakness and wheezing. Known metastatic urothelial carcinoma. EXAM: CHEST  1 VIEW COMPARISON:  12/09/2023 FINDINGS: Stable heart size. Stable mediastinal widening with right paratracheal prominence. Very low right-sided lung volume after thoracentesis without pneumothorax. Some residual right pleural fluid suspected. Left lung demonstrates normal aeration. No significant left pleural fluid. No pulmonary edema. IMPRESSION: Very low right-sided lung volume after thoracentesis without pneumothorax. Some residual right pleural fluid suspected. Electronically Signed   By: Erica Hau M.D.   On: 12/12/2023 10:58   US  THORACENTESIS ASP PLEURAL SPACE W/IMG GUIDE Result Date: 12/12/2023 INDICATION: 83 y.o. Male. History of bladder cancer with recurrent bilateral pleural effusions. Request is for right sided thoracentesis. EXAM: ULTRASOUND GUIDED THERAPEUTIC RIGHT SIDED THORACENTESIS MEDICATIONS: Lidocaine  1 % - 10 mls COMPLICATIONS: None immediate. PROCEDURE: An ultrasound guided thoracentesis was thoroughly discussed with the patient and questions answered. The benefits, risks, alternatives and complications were also discussed. The patient understands and wishes to proceed with the procedure. Written consent was obtained. Ultrasound was performed to localize and mark an adequate pocket of fluid in the right chest. The area was then prepped and draped in the normal sterile fashion. 1% Lidocaine  was used for local anesthesia. Under ultrasound guidance a 6 Fr Safe-T-Centesis catheter was introduced. Thoracentesis was performed. The catheter was removed and a dressing applied. FINDINGS: A total of approximately 1.3 liters of straw colored fluid was removed. IMPRESSION: Successful ultrasound guided right sided therapeutic thoracentesis yielding  1.3 liters of pleural fluid. Performed by: Reagan Camera Electronically Signed   By: Erica Hau M.D.   On: 12/12/2023 10:54

## 2024-01-10 NOTE — Patient Instructions (Addendum)
[facility] Discharge Instructions   You were seen and examined today by Dr. Katragadda.  He reviewed the results of your lab work which are normal/stable.   He reviewed the results of your CT scan which is stable.   We will proceed with your treatment today.   Return as scheduled.    Thank you for choosing [facility] at [facility] to provide your oncology and hematology care.  To afford each patient quality time with our provider, please arrive at least 15 minutes before your scheduled appointment time.   If you have a lab appointment with the Cancer Center please come in thru the Main Entrance and check in at the main information desk.  You need to re-schedule your appointment should you arrive 10 or more minutes late.  We strive to give you quality time with our providers, and arriving late affects you and other patients whose appointments are after yours.  Also, if you no show three or more times for appointments you may be dismissed from the clinic at the providers discretion.     Again, thank you for choosing [facility].  Our hope is that these requests will decrease the amount of time that you wait before being seen by our physicians.       _____________________________________________________________  Should you have questions after your visit to [facility], please contact our office at [phone] and follow the prompts.  Our office hours are 8:00 a.m. and 4:30 p.m. Monday - Friday.  Please note that voicemails left after 4:00 p.m. may not be returned until the following business day.  We are closed weekends and major holidays.  You do have access to a nurse 24-7, just call the main number to the clinic [phone] and do not press any options, hold on the line and a nurse will answer the phone.    For prescription refill requests, have your pharmacy contact our office and allow 72 hours.     Due to Covid, you will need to wear a mask upon entering the hospital. If you do not have a mask, a mask will be given to you at the Main Entrance upon arrival. For doctor visits, patients may have 1 support person age 18 or older with them. For treatment visits, patients can not have anyone with them due to social distancing guidelines and our immunocompromised population.      

## 2024-01-11 LAB — T4: T4, Total: 8.2 ug/dL (ref 4.5–12.0)

## 2024-01-13 ENCOUNTER — Ambulatory Visit (HOSPITAL_COMMUNITY)
Admission: RE | Admit: 2024-01-13 | Discharge: 2024-01-13 | Disposition: A | Discharge status: Home / Self Care | Source: Ambulatory Visit | Attending: Radiology | Admitting: Radiology

## 2024-01-13 ENCOUNTER — Encounter (HOSPITAL_COMMUNITY): Payer: Self-pay

## 2024-01-13 ENCOUNTER — Ambulatory Visit (HOSPITAL_COMMUNITY)
Admission: RE | Admit: 2024-01-13 | Discharge: 2024-01-13 | Disposition: A | Discharge status: Home / Self Care | Source: Ambulatory Visit | Attending: Hematology | Admitting: Hematology

## 2024-01-13 DIAGNOSIS — S22068A Other fracture of T7-T8 thoracic vertebra, initial encounter for closed fracture: Secondary | ICD-10-CM | POA: Diagnosis not present

## 2024-01-13 DIAGNOSIS — Z48813 Encounter for surgical aftercare following surgery on the respiratory system: Secondary | ICD-10-CM | POA: Diagnosis not present

## 2024-01-13 DIAGNOSIS — R911 Solitary pulmonary nodule: Secondary | ICD-10-CM | POA: Diagnosis not present

## 2024-01-13 DIAGNOSIS — C679 Malignant neoplasm of bladder, unspecified: Secondary | ICD-10-CM | POA: Diagnosis not present

## 2024-01-13 DIAGNOSIS — C771 Secondary and unspecified malignant neoplasm of intrathoracic lymph nodes: Secondary | ICD-10-CM | POA: Diagnosis not present

## 2024-01-13 DIAGNOSIS — Z79620 Long term (current) use of immunosuppressive biologic: Secondary | ICD-10-CM | POA: Diagnosis not present

## 2024-01-13 DIAGNOSIS — J90 Pleural effusion, not elsewhere classified: Secondary | ICD-10-CM | POA: Diagnosis not present

## 2024-01-13 DIAGNOSIS — C7951 Secondary malignant neoplasm of bone: Secondary | ICD-10-CM | POA: Diagnosis not present

## 2024-01-13 DIAGNOSIS — Z5112 Encounter for antineoplastic immunotherapy: Secondary | ICD-10-CM | POA: Diagnosis not present

## 2024-01-13 MED ORDER — LIDOCAINE HCL (PF) 2 % IJ SOLN
10.0000 mL | Freq: Once | INTRAMUSCULAR | Status: AC
  Administered 2024-01-13: 10 mL

## 2024-01-13 MED ORDER — LIDOCAINE HCL (PF) 2 % IJ SOLN
INTRAMUSCULAR | Status: AC
  Filled 2024-01-13: qty 10

## 2024-01-13 NOTE — Progress Notes (Signed)
 Patient tolerated Right Thoracentesis procedure well today and 1.3 Liters of clear yellow pleural fluid removed. Patient verbalized understanding of discharge instructions and transported via wheelchair to xray at this time for post chest xray with no acute distress noted.

## 2024-01-20 ENCOUNTER — Ambulatory Visit (HOSPITAL_COMMUNITY)
Admission: RE | Admit: 2024-01-20 | Discharge: 2024-01-20 | Disposition: A | Discharge status: Home / Self Care | Source: Ambulatory Visit | Attending: Hematology | Admitting: Hematology

## 2024-01-20 ENCOUNTER — Encounter (HOSPITAL_COMMUNITY): Payer: Self-pay

## 2024-01-20 DIAGNOSIS — Z48813 Encounter for surgical aftercare following surgery on the respiratory system: Secondary | ICD-10-CM | POA: Diagnosis not present

## 2024-01-20 DIAGNOSIS — Z8551 Personal history of malignant neoplasm of bladder: Secondary | ICD-10-CM | POA: Insufficient documentation

## 2024-01-20 DIAGNOSIS — J90 Pleural effusion, not elsewhere classified: Secondary | ICD-10-CM | POA: Insufficient documentation

## 2024-01-20 DIAGNOSIS — J9811 Atelectasis: Secondary | ICD-10-CM | POA: Diagnosis not present

## 2024-01-20 NOTE — Progress Notes (Signed)
 Patient tolerated Right Thoracentesis procedure well today and 1.6 Liters of yellow pleural fluid removed. Patient verbalized understanding of discharge instructions and transported via wheelchair at this time to xray for post chest xray with no acute distress noted.

## 2024-01-27 ENCOUNTER — Ambulatory Visit (HOSPITAL_COMMUNITY)
Admission: RE | Admit: 2024-01-27 | Discharge: 2024-01-27 | Disposition: A | Discharge status: Home / Self Care | Source: Ambulatory Visit | Attending: Physician Assistant | Admitting: Physician Assistant

## 2024-01-27 ENCOUNTER — Encounter (HOSPITAL_COMMUNITY): Payer: Self-pay

## 2024-01-27 ENCOUNTER — Ambulatory Visit (HOSPITAL_COMMUNITY)
Admission: RE | Admit: 2024-01-27 | Discharge: 2024-01-27 | Disposition: A | Discharge status: Home / Self Care | Source: Ambulatory Visit | Attending: Hematology | Admitting: Hematology

## 2024-01-27 DIAGNOSIS — Z48813 Encounter for surgical aftercare following surgery on the respiratory system: Secondary | ICD-10-CM | POA: Diagnosis not present

## 2024-01-27 DIAGNOSIS — J90 Pleural effusion, not elsewhere classified: Secondary | ICD-10-CM

## 2024-01-27 DIAGNOSIS — Z8551 Personal history of malignant neoplasm of bladder: Secondary | ICD-10-CM | POA: Diagnosis not present

## 2024-01-27 DIAGNOSIS — J9811 Atelectasis: Secondary | ICD-10-CM | POA: Diagnosis not present

## 2024-01-27 MED ORDER — LIDOCAINE HCL (PF) 2 % IJ SOLN
INTRAMUSCULAR | Status: AC
  Filled 2024-01-27: qty 10

## 2024-01-27 NOTE — Procedures (Signed)
 PROCEDURE SUMMARY:  Successful image-guided right thoracentesis. Yielded 1.6 liters of clear yellow fluid. Patient tolerated procedure well. EBL: Zero No immediate complications.  Specimen was not sent for labs. Post procedure CXR shows no pneumothorax.  Please see imaging section of Epic for full dictation.  Marilu Shown PA-C 01/27/2024 9:30 AM

## 2024-01-27 NOTE — Progress Notes (Signed)
 Patient tolerated Right Thoracentesis procedure well today and 1.6 Liters of clear yellow pleural fluid removed. Patient verbalized understanding of discharge instructions and transported via wheelchair to xray this time for post chest xray with no acute distress noted.

## 2024-01-31 ENCOUNTER — Inpatient Hospital Stay: Attending: Oncology

## 2024-01-31 ENCOUNTER — Inpatient Hospital Stay

## 2024-01-31 ENCOUNTER — Inpatient Hospital Stay: Admitting: Hematology

## 2024-01-31 VITALS — BP 101/55 | HR 84 | Temp 97.1°F | Resp 18

## 2024-01-31 DIAGNOSIS — C7951 Secondary malignant neoplasm of bone: Secondary | ICD-10-CM | POA: Insufficient documentation

## 2024-01-31 DIAGNOSIS — Z79620 Long term (current) use of immunosuppressive biologic: Secondary | ICD-10-CM | POA: Diagnosis not present

## 2024-01-31 DIAGNOSIS — C7802 Secondary malignant neoplasm of left lung: Secondary | ICD-10-CM | POA: Insufficient documentation

## 2024-01-31 DIAGNOSIS — Z5112 Encounter for antineoplastic immunotherapy: Secondary | ICD-10-CM | POA: Insufficient documentation

## 2024-01-31 DIAGNOSIS — C679 Malignant neoplasm of bladder, unspecified: Secondary | ICD-10-CM

## 2024-01-31 DIAGNOSIS — C7801 Secondary malignant neoplasm of right lung: Secondary | ICD-10-CM | POA: Diagnosis not present

## 2024-01-31 LAB — COMPREHENSIVE METABOLIC PANEL WITH GFR
ALT: 11 U/L (ref 0–44)
AST: 17 U/L (ref 15–41)
Albumin: 2.7 g/dL — ABNORMAL LOW (ref 3.5–5.0)
Alkaline Phosphatase: 39 U/L (ref 38–126)
Anion gap: 10 (ref 5–15)
BUN: 27 mg/dL — ABNORMAL HIGH (ref 8–23)
CO2: 23 mmol/L (ref 22–32)
Calcium: 9 mg/dL (ref 8.9–10.3)
Chloride: 105 mmol/L (ref 98–111)
Creatinine, Ser: 0.89 mg/dL (ref 0.61–1.24)
GFR, Estimated: >60 mL/min (ref >60)
Glucose, Bld: 105 mg/dL — ABNORMAL HIGH (ref 70–99)
Potassium: 4.6 mmol/L (ref 3.5–5.1)
Sodium: 138 mmol/L (ref 135–145)
Total Bilirubin: 0.5 mg/dL (ref 0.0–1.2)
Total Protein: 5.9 g/dL — ABNORMAL LOW (ref 6.5–8.1)

## 2024-01-31 LAB — TSH: TSH: 0.423 u[IU]/mL (ref 0.350–4.500)

## 2024-01-31 LAB — CBC WITH DIFFERENTIAL/PLATELET
Abs Immature Granulocytes: 0.13 10*3/uL — ABNORMAL HIGH (ref 0.00–0.07)
Basophils Absolute: 0.1 10*3/uL (ref 0.0–0.1)
Basophils Relative: 0 %
Eosinophils Absolute: 0 10*3/uL (ref 0.0–0.5)
Eosinophils Relative: 0 %
HCT: 47 % (ref 39.0–52.0)
Hemoglobin: 15.6 g/dL (ref 13.0–17.0)
Immature Granulocytes: 1 %
Lymphocytes Relative: 3 %
Lymphs Abs: 0.5 10*3/uL — ABNORMAL LOW (ref 0.7–4.0)
MCH: 35.6 pg — ABNORMAL HIGH (ref 26.0–34.0)
MCHC: 33.2 g/dL (ref 30.0–36.0)
MCV: 107.3 fL — ABNORMAL HIGH (ref 80.0–100.0)
Monocytes Absolute: 1 10*3/uL (ref 0.1–1.0)
Monocytes Relative: 7 %
Neutro Abs: 14.2 10*3/uL — ABNORMAL HIGH (ref 1.7–7.7)
Neutrophils Relative %: 89 %
Platelets: 314 10*3/uL (ref 150–400)
RBC: 4.38 MIL/uL (ref 4.22–5.81)
RDW: 12.2 % (ref 11.5–15.5)
WBC: 15.9 10*3/uL — ABNORMAL HIGH (ref 4.0–10.5)
nRBC: 0 % (ref 0.0–0.2)

## 2024-01-31 LAB — MAGNESIUM: Magnesium: 2.3 mg/dL (ref 1.7–2.4)

## 2024-01-31 MED ORDER — SODIUM CHLORIDE 0.9 % IV SOLN: Freq: Once | INTRAVENOUS | Status: AC

## 2024-01-31 MED ORDER — SODIUM CHLORIDE 0.9 % IV SOLN
200.0000 mg | Freq: Once | INTRAVENOUS | Status: AC
  Administered 2024-01-31: 200 mg via INTRAVENOUS
  Filled 2024-01-31: qty 8

## 2024-01-31 NOTE — Progress Notes (Signed)
 Patient presents today for Keytruda  infusion . Vital signs and lab work within parameters for treatment. MAR reviewed.

## 2024-01-31 NOTE — Progress Notes (Signed)
 Keytruda  given today per MD orders. Tolerated infusion without adverse affects. Vital signs stable. No complaints at this time. Discharged from via wheelchair ambulatory in stable condition. Alert and oriented x 3. F/U with [facility] as scheduled.

## 2024-01-31 NOTE — Patient Instructions (Signed)
 [facility] [place] - A DEPT OF [facility]  Discharge Instructions: Thank you for choosing [facility] to provide your oncology and hematology care.  If you have a lab appointment with the Cancer Center - please note that after April 8th, 2024, all labs will be drawn in the cancer center.  You do not have to check in or register with the main entrance as you have in the past but will complete your check-in in the cancer center.  Wear comfortable clothing and clothing appropriate for easy access to any Portacath or PICC line.   We strive to give you quality time with your provider. You may need to reschedule your appointment if you arrive late (15 or more minutes).  Arriving late affects you and other patients whose appointments are after yours.  Also, if you miss three or more appointments without notifying the office, you may be dismissed from the clinic at the provider's discretion.      For prescription refill requests, have your pharmacy contact our office and allow 72 hours for refills to be completed.    Today you received the following chemotherapy and/or immunotherapy agents Keytruda   To help prevent nausea and vomiting after your treatment, we encourage you to take your nausea medication as directed.  Pembrolizumab Injection What is this medication? PEMBROLIZUMAB (PEM broe LIZ ue mab) treats some types of cancer. It works by helping your immune system slow or stop the spread of cancer cells. It is a monoclonal antibody. This medicine may be used for other purposes; ask your health care provider or pharmacist if you have questions. COMMON BRAND NAME(S): Keytruda What should I tell my care team before I take this medication? They need to know if you have any of these conditions: Allogeneic stem cell transplant (uses someone else's stem cells) Autoimmune diseases, such as Crohn disease, ulcerative colitis, lupus History of chest radiation Nervous  system problems, such as Guillain-Barre syndrome, myasthenia gravis Organ transplant An unusual or allergic reaction to pembrolizumab, other medications, foods, dyes, or preservatives Pregnant or trying to get pregnant Breast-feeding How should I use this medication? This medication is injected into a vein. It is given by your care team in a hospital or clinic setting. A special MedGuide will be given to you before each treatment. Be sure to read this information carefully each time. Talk to your care team about the use of this medication in children. While it may be prescribed for children as young as 6 months for selected conditions, precautions do apply. Overdosage: If you think you have taken too much of this medicine contact a poison control center or emergency room at once. NOTE: This medicine is only for you. Do not share this medicine with others. What if I miss a dose? Keep appointments for follow-up doses. It is important not to miss your dose. Call your care team if you are unable to keep an appointment. What may interact with this medication? Interactions have not been studied. This list may not describe all possible interactions. Give your health care provider a list of all the medicines, herbs, non-prescription drugs, or dietary supplements you use. Also tell them if you smoke, drink alcohol, or use illegal drugs. Some items may interact with your medicine. What should I watch for while using this medication? Your condition will be monitored carefully while you are receiving this medication. You may need blood work while taking this medication. This medication may cause  serious skin reactions. They can happen weeks to months after starting the medication. Contact your care team right away if you notice fevers or flu-like symptoms with a rash. The rash may be red or purple and then turn into blisters or peeling of the skin. You may also notice a red rash with swelling of the face,  lips, or lymph nodes in your neck or under your arms. Tell your care team right away if you have any change in your eyesight. Talk to your care team if you may be pregnant. Serious birth defects can occur if you take this medication during pregnancy and for 4 months after the last dose. You will need a negative pregnancy test before starting this medication. Contraception is recommended while taking this medication and for 4 months after the last dose. Your care team can help you find the option that works for you. Do not breastfeed while taking this medication and for 4 months after the last dose. What side effects may I notice from receiving this medication? Side effects that you should report to your care team as soon as possible: Allergic reactions--skin rash, itching, hives, swelling of the face, lips, tongue, or throat Dry cough, shortness of breath or trouble breathing Eye pain, redness, irritation, or discharge with blurry or decreased vision Heart muscle inflammation--unusual weakness or fatigue, shortness of breath, chest pain, fast or irregular heartbeat, dizziness, swelling of the ankles, feet, or hands Hormone gland problems--headache, sensitivity to light, unusual weakness or fatigue, dizziness, fast or irregular heartbeat, increased sensitivity to cold or heat, excessive sweating, constipation, hair loss, increased thirst or amount of urine, tremors or shaking, irritability Infusion reactions--chest pain, shortness of breath or trouble breathing, feeling faint or lightheaded Kidney injury (glomerulonephritis)--decrease in the amount of urine, red or dark brown urine, foamy or bubbly urine, swelling of the ankles, hands, or feet Liver injury--right upper belly pain, loss of appetite, nausea, light-colored stool, dark yellow or brown urine, yellowing skin or eyes, unusual weakness or fatigue Pain, tingling, or numbness in the hands or feet, muscle weakness, change in vision, confusion or  trouble speaking, loss of balance or coordination, trouble walking, seizures Rash, fever, and swollen lymph nodes Redness, blistering, peeling, or loosening of the skin, including inside the mouth Sudden or severe stomach pain, bloody diarrhea, fever, nausea, vomiting Side effects that usually do not require medical attention (report to your care team if they continue or are bothersome): Bone, joint, or muscle pain Diarrhea Fatigue Loss of appetite Nausea Skin rash This list may not describe all possible side effects. Call your doctor for medical advice about side effects. You may report side effects to FDA at 1-800-FDA-1088. Where should I keep my medication? This medication is given in a hospital or clinic. It will not be stored at home. NOTE: This sheet is a summary. It may not cover all possible information. If you have questions about this medicine, talk to your doctor, pharmacist, or health care provider.  2024 Elsevier/Gold Standard (2021-12-15 00:00:00)  BELOW ARE SYMPTOMS THAT SHOULD BE REPORTED IMMEDIATELY: *FEVER GREATER THAN 100.4 F (38 C) OR HIGHER *CHILLS OR SWEATING *NAUSEA AND VOMITING THAT IS NOT CONTROLLED WITH YOUR NAUSEA MEDICATION *UNUSUAL SHORTNESS OF BREATH *UNUSUAL BRUISING OR BLEEDING *URINARY PROBLEMS (pain or burning when urinating, or frequent urination) *BOWEL PROBLEMS (unusual diarrhea, constipation, pain near the anus) TENDERNESS IN MOUTH AND THROAT WITH OR WITHOUT PRESENCE OF ULCERS (sore throat, sores in mouth, or a toothache) UNUSUAL RASH, SWELLING OR PAIN  UNUSUAL VAGINAL DISCHARGE OR ITCHING   Items with * indicate a potential emergency and should be followed up as soon as possible or go to the Emergency Department if any problems should occur.  Please show the CHEMOTHERAPY ALERT CARD or IMMUNOTHERAPY ALERT CARD at check-in to the Emergency Department and triage nurse.  Should you have questions after your visit or need to cancel or reschedule  your appointment, please contact [facility] [place] - A DEPT OF [facility] [phone]  and follow the prompts.  Office hours are 8:00 a.m. to 4:30 p.m. Monday - Friday. Please note that voicemails left after 4:00 p.m. may not be returned until the following business day.  We are closed weekends and major holidays. You have access to a nurse at all times for urgent questions. Please call the main number to the clinic [phone] and follow the prompts.  For any non-urgent questions, you may also contact your provider using MyChart. We now offer e-Visits for anyone 43 and older to request care online for non-urgent symptoms. For details visit [email or web address].   Also download the MyChart app! Go to the app store, search "MyChart", open the app, select [place], and log in with your MyChart username and password.

## 2024-02-01 LAB — T4: T4, Total: 8.2 ug/dL (ref 4.5–12.0)

## 2024-02-03 ENCOUNTER — Ambulatory Visit (HOSPITAL_COMMUNITY)
Admission: RE | Admit: 2024-02-03 | Discharge: 2024-02-03 | Disposition: A | Discharge status: Home / Self Care | Source: Ambulatory Visit | Attending: Hematology | Admitting: Hematology

## 2024-02-03 ENCOUNTER — Ambulatory Visit (HOSPITAL_COMMUNITY)
Admission: RE | Admit: 2024-02-03 | Discharge: 2024-02-03 | Disposition: A | Discharge status: Home / Self Care | Source: Ambulatory Visit | Attending: Radiology | Admitting: Radiology

## 2024-02-03 VITALS — BP 131/72 | HR 70 | Temp 98.3°F | Resp 16

## 2024-02-03 DIAGNOSIS — J90 Pleural effusion, not elsewhere classified: Secondary | ICD-10-CM

## 2024-02-03 DIAGNOSIS — Z8551 Personal history of malignant neoplasm of bladder: Secondary | ICD-10-CM | POA: Diagnosis not present

## 2024-02-03 DIAGNOSIS — R918 Other nonspecific abnormal finding of lung field: Secondary | ICD-10-CM | POA: Diagnosis not present

## 2024-02-03 DIAGNOSIS — R531 Weakness: Secondary | ICD-10-CM

## 2024-02-03 DIAGNOSIS — J984 Other disorders of lung: Secondary | ICD-10-CM | POA: Diagnosis not present

## 2024-02-03 DIAGNOSIS — J939 Pneumothorax, unspecified: Secondary | ICD-10-CM | POA: Diagnosis not present

## 2024-02-03 MED ORDER — LIDOCAINE HCL (PF) 2 % IJ SOLN
10.0000 mL | Freq: Once | INTRAMUSCULAR | Status: AC
  Administered 2024-02-03: 10 mL via INTRADERMAL

## 2024-02-03 MED ORDER — LIDOCAINE HCL (PF) 2 % IJ SOLN
INTRAMUSCULAR | Status: AC
  Filled 2024-02-03: qty 10

## 2024-02-03 NOTE — Progress Notes (Signed)
 Patient brought to US  Rm 1 per WC, no acute distress. No SOB, alert oriented. Thoracentesis discussed, explained, consent obtained. Prepped and draped in sterile fashion. Local anesthetic given with no adverse event. Access obtained at Left post thorax. Clear serous fluid obtained under suction, 600cc.Access removed, bandage placed. No hematoma, no SOB. Stat CXR ordered, transported to Xray per WC in no obvious distress.

## 2024-02-10 ENCOUNTER — Ambulatory Visit (HOSPITAL_COMMUNITY)
Admission: RE | Admit: 2024-02-10 | Discharge: 2024-02-10 | Disposition: A | Discharge status: Home / Self Care | Source: Ambulatory Visit | Attending: Hematology | Admitting: Hematology

## 2024-02-10 ENCOUNTER — Ambulatory Visit (HOSPITAL_COMMUNITY)
Admission: RE | Admit: 2024-02-10 | Discharge: 2024-02-10 | Disposition: A | Discharge status: Home / Self Care | Source: Ambulatory Visit | Attending: Radiology | Admitting: Radiology

## 2024-02-10 ENCOUNTER — Encounter (HOSPITAL_COMMUNITY): Payer: Self-pay

## 2024-02-10 DIAGNOSIS — J90 Pleural effusion, not elsewhere classified: Secondary | ICD-10-CM

## 2024-02-10 DIAGNOSIS — Z48813 Encounter for surgical aftercare following surgery on the respiratory system: Secondary | ICD-10-CM | POA: Diagnosis not present

## 2024-02-10 DIAGNOSIS — Z8551 Personal history of malignant neoplasm of bladder: Secondary | ICD-10-CM | POA: Diagnosis not present

## 2024-02-10 DIAGNOSIS — I771 Stricture of artery: Secondary | ICD-10-CM | POA: Diagnosis not present

## 2024-02-10 DIAGNOSIS — R9389 Abnormal findings on diagnostic imaging of other specified body structures: Secondary | ICD-10-CM | POA: Diagnosis not present

## 2024-02-10 MED ORDER — LIDOCAINE HCL (PF) 2 % IJ SOLN
INTRAMUSCULAR | Status: AC
  Filled 2024-02-10: qty 10

## 2024-02-10 MED ORDER — LIDOCAINE HCL (PF) 2 % IJ SOLN
10.0000 mL | Freq: Once | INTRAMUSCULAR | Status: AC
  Administered 2024-02-10: 10 mL via INTRADERMAL

## 2024-02-10 NOTE — Progress Notes (Signed)
 Patient transported t oUS per WC in no acute distress. US  imaging of chest obtained. Thoracentesis explained, consent obtained. Prepped and draped in sterile manner. Local anesthetic admin without adverse event.  Access obtained at R post thoraxe, clear serous fluid draining under vacutainer suction. Slight cough, with no SOB noted. 1.3 L clear serous fluid retrieved under vacutainer suction. Tolerated well. Access removed without difficulty, no bleeding or hematoma. Bandage placed. Transported to [facility] for chest film.

## 2024-02-10 NOTE — Procedures (Signed)
 Ultrasound-guided therapeutic right sided thoracentesis performed yielding 1.30 liters of cloudy yellow colored fluid. No immediate complications. Follow-up chest x-ray pending. EBL is < 2 ml.

## 2024-02-15 ENCOUNTER — Encounter (HOSPITAL_COMMUNITY): Payer: Self-pay

## 2024-02-15 ENCOUNTER — Ambulatory Visit (HOSPITAL_COMMUNITY)
Admission: RE | Admit: 2024-02-15 | Discharge: 2024-02-15 | Disposition: A | Discharge status: Home / Self Care | Source: Ambulatory Visit | Attending: Hematology | Admitting: Hematology

## 2024-02-15 ENCOUNTER — Ambulatory Visit (HOSPITAL_COMMUNITY)
Admission: RE | Admit: 2024-02-15 | Discharge: 2024-02-15 | Disposition: A | Discharge status: Home / Self Care | Source: Ambulatory Visit | Attending: Radiology | Admitting: Radiology

## 2024-02-15 DIAGNOSIS — Z8551 Personal history of malignant neoplasm of bladder: Secondary | ICD-10-CM | POA: Insufficient documentation

## 2024-02-15 DIAGNOSIS — R918 Other nonspecific abnormal finding of lung field: Secondary | ICD-10-CM | POA: Diagnosis not present

## 2024-02-15 DIAGNOSIS — Z48813 Encounter for surgical aftercare following surgery on the respiratory system: Secondary | ICD-10-CM | POA: Diagnosis not present

## 2024-02-15 DIAGNOSIS — J90 Pleural effusion, not elsewhere classified: Secondary | ICD-10-CM | POA: Diagnosis not present

## 2024-02-15 DIAGNOSIS — R9389 Abnormal findings on diagnostic imaging of other specified body structures: Secondary | ICD-10-CM | POA: Diagnosis not present

## 2024-02-15 MED ORDER — LIDOCAINE HCL (PF) 2 % IJ SOLN
10.0000 mL | Freq: Once | INTRAMUSCULAR | Status: AC
  Administered 2024-02-15: 10 mL via INTRADERMAL

## 2024-02-15 NOTE — Progress Notes (Signed)
 Patient brought to US  Rm1 per WC, in no acute distress. Thoracentesis explained, US  imaging obtained, consent obtained. Local anesthetic admin without adverse event. Access obtained at right post thorax, hazy, serous fluid retrieved under vacutainer suction. 1350cc total returned under suction. Slight cough. No SOB. Access removed, dermabond placed, bandage placed. No bleeding, no hematoma. Transported to xray for chest film.

## 2024-02-15 NOTE — Procedures (Signed)
 Ultrasound-guided therapeutic right sided thoracentesis performed yielding 1.35 liters of hazy yellow  colored fluid. No immediate complications. Follow-up chest x-ray pending. EBL is < 2 ml.

## 2024-02-16 ENCOUNTER — Ambulatory Visit (HOSPITAL_COMMUNITY)

## 2024-02-21 ENCOUNTER — Inpatient Hospital Stay: Attending: Oncology

## 2024-02-21 ENCOUNTER — Encounter: Payer: Self-pay | Admitting: Hematology

## 2024-02-21 ENCOUNTER — Inpatient Hospital Stay (HOSPITAL_BASED_OUTPATIENT_CLINIC_OR_DEPARTMENT_OTHER): Admitting: Hematology

## 2024-02-21 ENCOUNTER — Inpatient Hospital Stay

## 2024-02-21 VITALS — BP 137/75 | HR 84 | Temp 98.0°F | Resp 18 | Ht 71.0 in | Wt 176.0 lb

## 2024-02-21 VITALS — BP 127/67 | HR 78 | Temp 97.8°F | Resp 18

## 2024-02-21 DIAGNOSIS — C771 Secondary and unspecified malignant neoplasm of intrathoracic lymph nodes: Secondary | ICD-10-CM | POA: Diagnosis not present

## 2024-02-21 DIAGNOSIS — C7802 Secondary malignant neoplasm of left lung: Secondary | ICD-10-CM | POA: Insufficient documentation

## 2024-02-21 DIAGNOSIS — Z79620 Long term (current) use of immunosuppressive biologic: Secondary | ICD-10-CM | POA: Diagnosis not present

## 2024-02-21 DIAGNOSIS — Z5112 Encounter for antineoplastic immunotherapy: Secondary | ICD-10-CM | POA: Diagnosis not present

## 2024-02-21 DIAGNOSIS — C679 Malignant neoplasm of bladder, unspecified: Secondary | ICD-10-CM

## 2024-02-21 DIAGNOSIS — Z87891 Personal history of nicotine dependence: Secondary | ICD-10-CM | POA: Insufficient documentation

## 2024-02-21 DIAGNOSIS — C7951 Secondary malignant neoplasm of bone: Secondary | ICD-10-CM | POA: Insufficient documentation

## 2024-02-21 DIAGNOSIS — C7801 Secondary malignant neoplasm of right lung: Secondary | ICD-10-CM | POA: Diagnosis not present

## 2024-02-21 LAB — CBC WITH DIFFERENTIAL/PLATELET
Abs Immature Granulocytes: 0.09 K/uL — ABNORMAL HIGH (ref 0.00–0.07)
Basophils Absolute: 0.1 K/uL (ref 0.0–0.1)
Basophils Relative: 1 %
Eosinophils Absolute: 0.2 K/uL (ref 0.0–0.5)
Eosinophils Relative: 2 %
HCT: 47 % (ref 39.0–52.0)
Hemoglobin: 14.8 g/dL (ref 13.0–17.0)
Immature Granulocytes: 1 %
Lymphocytes Relative: 4 %
Lymphs Abs: 0.5 K/uL — ABNORMAL LOW (ref 0.7–4.0)
MCH: 33.9 pg (ref 26.0–34.0)
MCHC: 31.5 g/dL (ref 30.0–36.0)
MCV: 107.6 fL — ABNORMAL HIGH (ref 80.0–100.0)
Monocytes Absolute: 0.5 K/uL (ref 0.1–1.0)
Monocytes Relative: 4 %
Neutro Abs: 11.2 K/uL — ABNORMAL HIGH (ref 1.7–7.7)
Neutrophils Relative %: 88 %
Platelets: 293 K/uL (ref 150–400)
RBC: 4.37 MIL/uL (ref 4.22–5.81)
RDW: 12.9 % (ref 11.5–15.5)
WBC: 12.6 K/uL — ABNORMAL HIGH (ref 4.0–10.5)
nRBC: 0 % (ref 0.0–0.2)

## 2024-02-21 LAB — COMPREHENSIVE METABOLIC PANEL WITH GFR
ALT: 27 U/L (ref 0–44)
AST: 21 U/L (ref 15–41)
Albumin: 2.6 g/dL — ABNORMAL LOW (ref 3.5–5.0)
Alkaline Phosphatase: 42 U/L (ref 38–126)
Anion gap: 8 (ref 5–15)
BUN: 27 mg/dL — ABNORMAL HIGH (ref 8–23)
CO2: 24 mmol/L (ref 22–32)
Calcium: 8.7 mg/dL — ABNORMAL LOW (ref 8.9–10.3)
Chloride: 108 mmol/L (ref 98–111)
Creatinine, Ser: 0.93 mg/dL (ref 0.61–1.24)
GFR, Estimated: >60 mL/min (ref >60)
Glucose, Bld: 140 mg/dL — ABNORMAL HIGH (ref 70–99)
Potassium: 4.8 mmol/L (ref 3.5–5.1)
Sodium: 140 mmol/L (ref 135–145)
Total Bilirubin: 0.5 mg/dL (ref 0.0–1.2)
Total Protein: 5.9 g/dL — ABNORMAL LOW (ref 6.5–8.1)

## 2024-02-21 LAB — TSH: TSH: 0.856 u[IU]/mL (ref 0.350–4.500)

## 2024-02-21 LAB — MAGNESIUM: Magnesium: 2.4 mg/dL (ref 1.7–2.4)

## 2024-02-21 MED ORDER — SODIUM CHLORIDE 0.9 % IV SOLN
200.0000 mg | Freq: Once | INTRAVENOUS | Status: AC
  Administered 2024-02-21: 200 mg via INTRAVENOUS
  Filled 2024-02-21: qty 8

## 2024-02-21 MED ORDER — SODIUM CHLORIDE 0.9 % IV SOLN: Freq: Once | INTRAVENOUS | Status: AC

## 2024-02-21 NOTE — Progress Notes (Signed)
 Patient has been examined by Dr. Ellin Saba. Vital signs and labs have been reviewed by MD - ANC, Creatinine, LFTs, hemoglobin, and platelets are within treatment parameters per M.D. - pt may proceed with treatment.  Primary RN and pharmacy notified.

## 2024-02-21 NOTE — Patient Instructions (Signed)

## 2024-02-21 NOTE — Patient Instructions (Signed)
 [facility] [place] - A DEPT OF [facility]  Discharge Instructions: Thank you for choosing [facility] to provide your oncology and hematology care.  If you have a lab appointment with the Cancer Center - please note that after April 8th, 2024, all labs will be drawn in the cancer center.  You do not have to check in or register with the main entrance as you have in the past but will complete your check-in in the cancer center.  Wear comfortable clothing and clothing appropriate for easy access to any Portacath or PICC line.   We strive to give you quality time with your provider. You may need to reschedule your appointment if you arrive late (15 or more minutes).  Arriving late affects you and other patients whose appointments are after yours.  Also, if you miss three or more appointments without notifying the office, you may be dismissed from the clinic at the provider's discretion.      For prescription refill requests, have your pharmacy contact our office and allow 72 hours for refills to be completed.    Today you received the following chemotherapy and/or immunotherapy agents Keytruda, return as scheduled.   To help prevent nausea and vomiting after your treatment, we encourage you to take your nausea medication as directed.  BELOW ARE SYMPTOMS THAT SHOULD BE REPORTED IMMEDIATELY: *FEVER GREATER THAN 100.4 F (38 C) OR HIGHER *CHILLS OR SWEATING *NAUSEA AND VOMITING THAT IS NOT CONTROLLED WITH YOUR NAUSEA MEDICATION *UNUSUAL SHORTNESS OF BREATH *UNUSUAL BRUISING OR BLEEDING *URINARY PROBLEMS (pain or burning when urinating, or frequent urination) *BOWEL PROBLEMS (unusual diarrhea, constipation, pain near the anus) TENDERNESS IN MOUTH AND THROAT WITH OR WITHOUT PRESENCE OF ULCERS (sore throat, sores in mouth, or a toothache) UNUSUAL RASH, SWELLING OR PAIN  UNUSUAL VAGINAL DISCHARGE OR ITCHING   Items with * indicate a potential emergency and  should be followed up as soon as possible or go to the Emergency Department if any problems should occur.  Please show the CHEMOTHERAPY ALERT CARD or IMMUNOTHERAPY ALERT CARD at check-in to the Emergency Department and triage nurse.  Should you have questions after your visit or need to cancel or reschedule your appointment, please contact [facility] [place] - A DEPT OF [facility] [phone]  and follow the prompts.  Office hours are 8:00 a.m. to 4:30 p.m. Monday - Friday. Please note that voicemails left after 4:00 p.m. may not be returned until the following business day.  We are closed weekends and major holidays. You have access to a nurse at all times for urgent questions. Please call the main number to the clinic [phone] and follow the prompts.  For any non-urgent questions, you may also contact your provider using MyChart. We now offer e-Visits for anyone 58 and older to request care online for non-urgent symptoms. For details visit [email or web address].   Also download the MyChart app! Go to the app store, search "MyChart", open the app, select [place], and log in with your MyChart username and password.

## 2024-02-21 NOTE — Progress Notes (Signed)
 Patient okay for treatment per Dr. Katragadda. Patient tolerated therapy with no complaints voiced. Side effects with management reviewed with understanding verbalized. IV site clean and dry with no bruising or swelling noted at site. Good blood return noted before and after administration of therapy. Band aid applied. Patient left in satisfactory condition with VSS and no s/s of distress noted.

## 2024-02-21 NOTE — Progress Notes (Signed)
 [name] [address] [place] [address]    Clinic Day:  02/21/2024  Referring physician: Bertell Satterfield, MD  Patient Care Team: Bertell Satterfield, MD as PCP - General (Internal Medicine) Waddell Danelle ORN, MD as PCP - Electrophysiology (Cardiology) Rogers Hai, MD as Medical Oncologist (Medical Oncology) Celestia Joesph SQUIBB, RN as Oncology Nurse Navigator (Medical Oncology)   ASSESSMENT & PLAN:   Assessment: 1.  Metastatic urothelial carcinoma of the bladder to the lungs and bones: - Neoadjuvant chemotherapy with gemcitabine  and cisplatin  cycle 1 on 05/02/2017 - Cystoprostatectomy, bilateral lymphadenectomy by Dr. Alvaro on 06/29/2017, pathology T3b N0, 0/13 lymph nodes - Developed metastatic disease in April 2019, biopsy of the right ischium on 12/08/2017 with poorly differentiated carcinoma consistent with metastatic urothelial carcinoma. - XRT to the pelvis completed on 01/16/2018 - Carboplatin  and gemcitabine  6 cycles from 01/30/2018 through 05/22/2018 - Pembrolizumab  every 3 weeks started on 06/21/2018, discontinued in April 2020 due to skin toxicity - XRT to the right paratracheal lymph node, 50 Gray in 10 fractions completed on 04/23/2020 - XRT to the intrathoracic lymph node completed on 11/27/2021  - 12/20/2022: Right thoracentesis with 1.8 L removed - 12/21/2022: Left thoracentesis with 700 mL fluid removed, cytology negative. - Unclear etiology of recurrent effusions, exudative. - 2D echocardiogram: Normal LVEF with grade 1 diastolic dysfunction. - CT angiogram 12/20/2022: Negative for PE.  Multifocal parenchymal opacities.  No adenopathy. - Keytruda  started on 01/07/2023.   2.  Social/family history: - Lives at home with his wife. - Worked for [employer].  Quit smoking 40 years ago. - No family history of malignancies.    Plan: 1.  Metastatic urothelial carcinoma of the bladder to the lungs and bones: - CT CAP (01/03/2024): Stable  destructive lesion in the right inferior pubic ramus and stable 9 mm nonspecific left upper lobe nodule.  Annual age-indeterminate compression fracture of the T7 superior endplate. - He does not report any immunotherapy related side effects. - Labs today: Normal LFTs with albumin 2.6.  CBC grossly normal with mild leukocytosis from steroid.  TSH is 0.8. - He will proceed with Keytruda  today and in 3 weeks.  RTC 6 weeks with repeat CT CAP with contrast. - He may continue Keytruda  for a total time of 2 years and if there is no evidence of progression, will consider discontinuing it and monitoring.   2.  Recurrent right pleural effusion: - He is having right thoracentesis almost once a week and averaging around 1.3 L.  Occasionally has left thoracentesis.  We have talked about Pleurx catheter in the past and today.  He does not want to have any.   3.  Loss of appetite/weight loss: - When he started taking Megace  twice daily, his appetite has improved tremendously.  As result Megace  was discontinued 2 weeks ago.  He is continuing to drink 3 to 4 cans of boost high-protein per day.  According to the wife, he is eating half a gallon of ice cream daily.  He has gained close to 14 pounds.   4.  Immunotherapy induced skin rash: - Continue prednisone  10 mg daily.  Use hydroxyzine  for itching as needed.   5.  Cancer-related fatigue: - Continue Ritalin  10 mg in the mornings as needed.    Orders Placed This Encounter  Procedures   CT CHEST ABDOMEN PELVIS W CONTRAST    Standing Status:   Future    Expected Date:   03/23/2024    Expiration  Date:   02/20/2025    If indicated for the ordered procedure, I authorize the administration of contrast media per Radiology protocol:   Yes    Does the patient have a contrast media/X-ray dye allergy?:   No    Preferred imaging location?:   [facility]    If indicated for the ordered procedure, I authorize the administration of oral contrast media per Radiology  protocol:   Yes      I,Helena R Teague,acting as a scribe for Alean Stands, MD.,have documented all relevant documentation on the behalf of Alean Stands, MD,as directed by  Alean Stands, MD while in the presence of Alean Stands, MD.  I, Alean Stands MD, have reviewed the above documentation for accuracy and completeness, and I agree with the above.      Alean Stands, MD   7/8/20252:33 PM  CHIEF COMPLAINT:   Diagnosis: Metastatic urothelial carcinoma of the bladder    Cancer Staging  No matching staging information was found for the patient.    Prior Therapy: 1. Neoadjuvant gemcitabine /cisplatin  on 05/02/17 (one cycle) 2. Cystoprostatectomy and bilateral lymphadenectomy on 06/29/2017 (Dr. Alvaro) 3. Radiation therapy to the pelvic lesion 01/02/18 - 01/16/18 4. Carboplatin  and gemcitabine , 6 cycles 02/06/18 - 05/22/18 5. Pembrolizumab  06/21/18 - 12/13/18 6. Radiation therapy to RUL and right paratracheal nodes 04/09/20 - 04/23/20 7. Radiation therapy to intrathoracic lymph nodes 11/16/21 - 11/27/21  Current Therapy:  pembrolizumab     HISTORY OF PRESENT ILLNESS:   Oncology History  Stage IV malignant neoplasm of urinary bladder /with mets to the bones and lungs  04/20/2017 Initial Diagnosis   Malignant neoplasm of urinary bladder (HCC)   01/30/2018 - 05/22/2018 Chemotherapy   The patient had palonosetron  (ALOXI ) injection 0.25 mg, 0.25 mg, Intravenous,  Once, 6 of 6 cycles Administration: 0.25 mg (01/30/2018), 0.25 mg (02/20/2018), 0.25 mg (03/13/2018), 0.25 mg (04/03/2018), 0.25 mg (05/01/2018), 0.25 mg (05/22/2018) CARBOplatin  (PARAPLATIN ) 400 mg in sodium chloride  0.9 % 250 mL chemo infusion, 400 mg (100 % of original dose 400 mg), Intravenous,  Once, 6 of 6 cycles Dose modification: 400 mg (original dose 400 mg, Cycle 1),   (original dose 400 mg, Cycle 2, Reason: Patient Age), 410 mg (original dose 400 mg, Cycle 6) Administration: 400 mg (01/30/2018),  400 mg (02/20/2018), 400 mg (03/13/2018), 410 mg (04/03/2018), 410 mg (05/01/2018), 410 mg (05/22/2018) gemcitabine  (GEMZAR ) 2,128 mg in sodium chloride  0.9 % 250 mL chemo infusion, 1,000 mg/m2 = 2,128 mg, Intravenous,  Once, 6 of 6 cycles Dose modification: 800 mg/m2 (original dose 1,000 mg/m2, Cycle 4, Reason: Provider Judgment), 800 mg/m2 (original dose 1,000 mg/m2, Cycle 4, Reason: Provider Judgment) Administration: 2,128 mg (01/30/2018), 2,128 mg (02/06/2018), 2,128 mg (02/20/2018), 2,128 mg (02/27/2018), 2,128 mg (03/13/2018), 1,710 mg (04/03/2018), 1,710 mg (04/10/2018), 1,710 mg (05/01/2018), 1,710 mg (05/22/2018) fosaprepitant  (EMEND ) 150 mg, dexamethasone  (DECADRON ) 12 mg in sodium chloride  0.9 % 145 mL IVPB, , Intravenous,  Once, 6 of 6 cycles Administration:  (01/30/2018),  (02/20/2018),  (03/13/2018),  (04/03/2018),  (05/01/2018),  (05/22/2018)  for chemotherapy treatment.    01/07/2023 -  Chemotherapy   Patient is on Treatment Plan : BLADDER Pembrolizumab  (200) q21d     Bladder cancer (HCC)  06/28/2017 Initial Diagnosis   Bladder cancer (HCC)   06/21/2018 - 12/13/2018 Chemotherapy   The patient had pembrolizumab  (KEYTRUDA ) 200 mg in sodium chloride  0.9 % 50 mL chemo infusion, 200 mg, Intravenous, Once, 8 of 9 cycles Administration: 200 mg (06/21/2018), 200 mg (07/12/2018), 200  mg (08/02/2018), 200 mg (08/23/2018), 200 mg (09/13/2018), 200 mg (10/04/2018), 200 mg (11/22/2018), 200 mg (12/13/2018)  for chemotherapy treatment.       INTERVAL HISTORY:   [name] is a 83 y.o. male presenting to clinic today for follow up of Metastatic urothelial carcinoma of the bladder. He was last seen by me on 01/10/24.  Today, he states that he is doing well overall. His appetite level is at 100%. His energy level is at 50%. [name] is accompanied by his wife.   His wife reports he is eating well due to taking Megace  and craves lots of sweets. He has gained over 10 pounds since his last visit and will drink 3-4 Boosts/Ensures a  day. Mael quit taking megace  2 weeks ago when he began craving sweets and his appetite has remained the same since then. He is eating a half a gallon of ice cream a day, including a pint of ice cream and multiple chocolate popsicles.   His itching is well controlled with prednisone  and hydroxyzine , though itching and rash will appear if he does not take the medications.    [name] reports an improvement in breathing and feeling well overall after thoracentesis.   PAST MEDICAL HISTORY:  0 Past Medical History: Past Medical History:  Diagnosis Date   Bladder cancer (HCC)    Bone cancer (HCC)    BPH (benign prostatic hypertrophy)    Dysuria    GERD (gastroesophageal reflux disease)    History of adenomatous polyp of colon    tubular adenoma 06/ 2018   History of bladder cancer 12/2014   urologist-  dr chauncey--  dx High Grade TCC without stromal invasion s/p TURBT and intravesical BCG tx/  recurrent 07/2015 (pTa) TCC  repear intravesical BCG tx    History of hiatal hernia    History of urethral stricture    Nocturia     Surgical History: Past Surgical History:  Procedure Laterality Date   CARDIAC CATHETERIZATION  1997   normal coronary arteries   CARDIOVASCULAR STRESS TEST  12-14-2005   Low risk perfusion study/  very small scar in the inferior septum from mid ventricle to apex,  no ischemia/  mild inferior septal hypokinesis/  ef 58%   CATARACT EXTRACTION W/ INTRAOCULAR LENS  IMPLANT, BILATERAL  2011   COLONOSCOPY  last one 06/ 2018   CYSTOSCOPY WITH BIOPSY N/A 08/12/2015   Procedure: CYSTOSCOPY WITH BIOPSY AND FULGERATION;  Surgeon: Redell Lynwood chauncey, MD;  Location: [facility];  Service: Urology;  Laterality: N/A;   CYSTOSCOPY WITH INJECTION N/A 06/29/2017   Procedure: CYSTOSCOPY WITH INJECTION OF INDOCYANINE GREEN DYE;  Surgeon: Alvaro Hummer, MD;  Location: [facility];  Service: Urology;  Laterality: N/A;   CYSTOSCOPY WITH URETHRAL DILATATION N/A 03/21/2017    Procedure: CYSTOSCOPY;  Surgeon: chauncey Redell Lynwood, MD;  Location: [name];  Service: Urology;  Laterality: N/A;   ESOPHAGOGASTRODUODENOSCOPY  12-05-2014   IR IMAGING GUIDED PORT INSERTION  02/07/2018   IR NEPHROSTOMY PLACEMENT LEFT  12/25/2021   IR REMOVAL TUN ACCESS W/ PORT W/O FL MOD SED  04/14/2018   TRANSTHORACIC ECHOCARDIOGRAM  01-31-2008   nomral LVF,  ef 55-65%/  mild pulmonic stenosis without regurg.,  peak transpulomonic valve gradient   TRANSURETHRAL RESECTION OF BLADDER TUMOR N/A 12/31/2014   Procedure: TRANSURETHRAL RESECTION OF BLADDER TUMOR (TURBT);  Surgeon: Oliva Oiler, MD;  Location: [name];  Service: Urology;  Laterality: N/A;   TRANSURETHRAL RESECTION OF BLADDER  TUMOR N/A 03/21/2017   Procedure: POSSIBLE TRANSURETHRAL RESECTION OF BLADDER TUMOR (TURBT);  Surgeon: Chauncey Redell Agent, MD;  Location: [facility];  Service: Urology;  Laterality: N/A;    Social History: Social History   Socioeconomic History   Marital status: Married    Spouse name: Not on file   Number of children: 1   Years of education: Not on file   Highest education level: Not on file  Occupational History   Occupation: Retired  Tobacco Use   Smoking status: Former    Current packs/day: 0.00    Average packs/day: 1 pack/day for 34.0 years (34.0 ttl pk-yrs)    Types: Cigarettes    Start date: 12/27/1954    Quit date: 12/26/1988    Years since quitting: 35.1   Smokeless tobacco: Never  Vaping Use   Vaping status: Never Used  Substance and Sexual Activity   Alcohol use: Yes    Alcohol/week: 6.0 standard drinks of alcohol    Types: 6 Cans of beer per week    Comment: BEER   Drug use: No   Sexual activity: Not Currently  Other Topics Concern   Not on file  Social History Narrative   Not on file   Social Drivers of Health   Financial Resource Strain: Not on file  Food Insecurity: No Food Insecurity (12/20/2022)   Hunger Vital Sign     Worried About Running Out of Food in the Last Year: Never true    Ran Out of Food in the Last Year: Never true  Transportation Needs: No Transportation Needs (12/20/2022)   PRAPARE - Administrator, Civil Service (Medical): No    Lack of Transportation (Non-Medical): No  Physical Activity: Not on file  Stress: Not on file  Social Connections: Unknown (12/28/2021)   Received from [facility]   Social Network    Social Network: Not on file  Intimate Partner Violence: Not At Risk (12/20/2022)   Humiliation, Afraid, Rape, and Kick questionnaire    Fear of Current or Ex-Partner: No    Emotionally Abused: No    Physically Abused: No    Sexually Abused: No    Family History: Family History  Problem Relation Age of Onset   Alcoholism Father    Colon cancer Neg Hx    Colon polyps Neg Hx    Diabetes Neg Hx    Kidney disease Neg Hx    Esophageal cancer Neg Hx    Heart disease Neg Hx    Gallbladder disease Neg Hx    Allergic rhinitis Neg Hx    Angioedema Neg Hx    Asthma Neg Hx    Atopy Neg Hx    Eczema Neg Hx    Immunodeficiency Neg Hx    Urticaria Neg Hx     Current Medications:  Current Outpatient Medications:    acetaminophen  (TYLENOL ) 500 MG tablet, Take 1,000 mg by mouth every 6 (six) hours as needed for mild pain, fever, moderate pain or headache., Disp: , Rfl:    apixaban  (ELIQUIS ) 5 MG TABS tablet, Take 1 tablet (5 mg total) by mouth 2 (two) times daily., Disp: 28 tablet, Rfl: 0   apixaban  (ELIQUIS ) 5 MG TABS tablet, Take 1 tablet (5 mg total) by mouth 2 (two) times daily., Disp: 28 tablet, Rfl: 0   betamethasone  dipropionate 0.05 % cream, Apply topically 2 (two) times daily., Disp: 60 g, Rfl: 2   budesonide -formoterol  (SYMBICORT ) 160-4.5 MCG/ACT inhaler, Inhale 2 puffs into  the lungs 2 (two) times daily., Disp: 1 each, Rfl: 6   chlorpheniramine-HYDROcodone  (TUSSIONEX) 10-8 MG/5ML, Take 5 mLs by mouth every 12 (twelve) hours as needed (severe cough)., Disp: 70 mL,  Rfl: 0   dextromethorphan -guaiFENesin  (MUCINEX  DM) 30-600 MG 12hr tablet, Take 1 tablet by mouth 2 (two) times daily., Disp: , Rfl:    hydrOXYzine  (ATARAX ) 25 MG tablet, Take 1 tablet (25 mg total) by mouth 3 (three) times daily as needed., Disp: 90 tablet, Rfl: 0   levothyroxine  (SYNTHROID ) 75 MCG tablet, Take 75 mcg by mouth daily., Disp: , Rfl:    megestrol  (MEGACE ) 400 MG/10ML suspension, Take 10 mLs (400 mg total) by mouth 2 (two) times daily., Disp: 480 mL, Rfl: 3   methenamine  (HIPREX ) 1 g tablet, Take 1 tablet (1 g total) by mouth 2 (two) times daily., Disp: 60 tablet, Rfl: 11   methylphenidate  (RITALIN ) 10 MG tablet, TAKE 1 TAB DAILY IF NEEDED. MAX 3 TABS/DAY., Disp: 30 tablet, Rfl: 0   metoprolol  tartrate (LOPRESSOR ) 25 MG tablet, Take 1 tablet (25 mg total) by mouth 2 (two) times daily., Disp: 60 tablet, Rfl: 2   pantoprazole  (PROTONIX ) 40 MG tablet, Take 1 tablet (40 mg total) by mouth 2 (two) times daily., Disp: 60 tablet, Rfl: 0   predniSONE  (DELTASONE ) 10 MG tablet, Take 1 tablet (10 mg total) by mouth daily with breakfast., Disp: 90 tablet, Rfl: 3   SSD 1 % cream, Apply topically 2 (two) times daily., Disp: , Rfl:    Vitamin D, Ergocalciferol, (DRISDOL) 1.25 MG (50000 UNIT) CAPS capsule, Take 50,000 Units by mouth once a week., Disp: , Rfl:   Current Facility-Administered Medications:    omalizumab  (XOLAIR ) prefilled syringe 300 mg, 300 mg, Subcutaneous, Q14 Days, Iva Marty Saltness, MD, 300 mg at 11/24/22 9143  Facility-Administered Medications Ordered in Other Visits:    pembrolizumab  (KEYTRUDA ) 200 mg in sodium chloride  0.9 % 50 mL chemo infusion, 200 mg, Intravenous, Once, Rogers Hai, MD   Allergies: Allergies  Allergen Reactions   Ciprofloxacin  Hives   Zofran  [Ondansetron ] Rash    REVIEW OF SYSTEMS:   Review of Systems  Constitutional:  Negative for chills, fatigue and fever.  HENT:   Negative for lump/mass, mouth sores, nosebleeds, sore throat and  trouble swallowing.   Eyes:  Negative for eye problems.  Respiratory:  Positive for cough and shortness of breath.   Cardiovascular:  Negative for chest pain, leg swelling and palpitations.  Gastrointestinal:  Positive for constipation. Negative for abdominal pain, diarrhea, nausea and vomiting.  Genitourinary:  Negative for bladder incontinence, difficulty urinating, dysuria, frequency, hematuria and nocturia.   Musculoskeletal:  Negative for arthralgias, back pain, flank pain, myalgias and neck pain.  Skin:  Negative for itching and rash.  Neurological:  Negative for dizziness, headaches and numbness.  Hematological:  Does not bruise/bleed easily.  Psychiatric/Behavioral:  Negative for depression, sleep disturbance and suicidal ideas. The patient is not nervous/anxious.   All other systems reviewed and are negative.    VITALS:   Blood pressure 137/75, pulse 84, temperature 98 F (36.7 C), temperature source Tympanic, resp. rate 18, height 5' 11 (1.803 m), weight 176 lb (79.8 kg), SpO2 97%.  Wt Readings from Last 3 Encounters:  02/21/24 176 lb (79.8 kg)  01/10/24 162 lb (73.5 kg)  12/20/23 166 lb 12.8 oz (75.7 kg)    Body mass index is 24.55 kg/m.  Performance status (ECOG): 1 - Symptomatic but completely ambulatory  PHYSICAL EXAM:   Physical Exam  Vitals and nursing note reviewed. Exam conducted with a chaperone present.  Constitutional:      Appearance: Normal appearance.  Cardiovascular:     Rate and Rhythm: Normal rate and regular rhythm.     Pulses: Normal pulses.     Heart sounds: Normal heart sounds.  Pulmonary:     Effort: Pulmonary effort is normal.     Breath sounds: Examination of the right-lower field reveals decreased breath sounds. Decreased breath sounds present.  Abdominal:     Palpations: Abdomen is soft. There is no hepatomegaly, splenomegaly or mass.     Tenderness: There is no abdominal tenderness.  Musculoskeletal:     Right lower leg: No edema.      Left lower leg: No edema.  Lymphadenopathy:     Cervical: No cervical adenopathy.     Right cervical: No superficial, deep or posterior cervical adenopathy.    Left cervical: No superficial, deep or posterior cervical adenopathy.     Upper Body:     Right upper body: No supraclavicular or axillary adenopathy.     Left upper body: No supraclavicular or axillary adenopathy.  Neurological:     General: No focal deficit present.     Mental Status: He is alert and oriented to person, place, and time.  Psychiatric:        Mood and Affect: Mood normal.        Behavior: Behavior normal.     LABS:      Latest Ref Rng & Units 02/21/2024    1:04 PM 01/31/2024   12:49 PM 01/10/2024   12:12 PM  CBC  WBC 4.0 - 10.5 K/uL 12.6  15.9  11.0   Hemoglobin 13.0 - 17.0 g/dL 85.1  84.3  84.6   Hematocrit 39.0 - 52.0 % 47.0  47.0  46.3   Platelets 150 - 400 K/uL 293  314  277       Latest Ref Rng & Units 02/21/2024    1:04 PM 01/31/2024   12:49 PM 01/10/2024   12:12 PM  CMP  Glucose 70 - 99 mg/dL 859  894  877   BUN 8 - 23 mg/dL 27  27  27    Creatinine 0.61 - 1.24 mg/dL 9.06  9.10  9.14   Sodium 135 - 145 mmol/L 140  138  140   Potassium 3.5 - 5.1 mmol/L 4.8  4.6  4.3   Chloride 98 - 111 mmol/L 108  105  105   CO2 22 - 32 mmol/L 24  23  26    Calcium 8.9 - 10.3 mg/dL 8.7  9.0  9.0   Total Protein 6.5 - 8.1 g/dL 5.9  5.9  5.7   Total Bilirubin 0.0 - 1.2 mg/dL 0.5  0.5  0.7   Alkaline Phos 38 - 126 U/L 42  39  61   AST 15 - 41 U/L 21  17  15    ALT 0 - 44 U/L 27  11  11       No results found for: CEA1, CEA / No results found for: CEA1, CEA No results found for: PSA1 No results found for: CAN199 No results found for: CAN125  No results found for: TOTALPROTELP, ALBUMINELP, A1GS, A2GS, BETS, BETA2SER, GAMS, MSPIKE, SPEI Lab Results  Component Value Date   TIBC 249 03/20/2018   FERRITIN 759 (H) 03/20/2018   IRONPCTSAT 20 (L) 03/20/2018   Lab Results  Component  Value Date   LDH 96 (L) 06/21/2022  STUDIES:   US  THORACENTESIS ASP PLEURAL SPACE W/IMG GUIDE Result Date: 02/15/2024 INDICATION: 83 year old male. History of metastatic bladder cancer with recurrent bilateral pleural effusions. Request is for therapeutic thoracentesis. EXAM: ULTRASOUND GUIDED THERAPEUTIC RIGHT-SIDED THORACENTESIS MEDICATIONS: Lidocaine  1% 10 mL COMPLICATIONS: None immediate. PROCEDURE: An ultrasound guided thoracentesis was thoroughly discussed with the patient and questions answered. The benefits, risks, alternatives and complications were also discussed. The patient understands and wishes to proceed with the procedure. Written consent was obtained. Ultrasound was performed to localize and mark an adequate pocket of fluid in the right chest. The area was then prepped and draped in the normal sterile fashion. 1% Lidocaine  was used for local anesthesia. Under ultrasound guidance a 6 Fr Safe-T-Centesis catheter was introduced. Thoracentesis was performed. The catheter was removed and a dressing applied. FINDINGS: A total of approximately 1.35 L of hazy yellow fluid was removed. IMPRESSION: Successful ultrasound guided therapeutic right-sided thoracentesis yielding 1.35 hazy yellow colored pleural fluid. Performed by Delon Beagle NP Electronically Signed   By: Ester Sides M.D.   On: 02/15/2024 10:34   DG Chest Port 1V same Day Result Date: 02/15/2024 CLINICAL DATA:  83 year old male status post right thoracentesis. EXAM: PORTABLE CHEST 1 VIEW COMPARISON:  02/10/2024 FINDINGS: Unchanged rightward shift of the mediastinum with blunting of the right costophrenic angle. Possible small ovoid apical pneumothorax. Unchanged scarring and likely degree of passive atelectasis in the right lung base. Persistent trace left pleural effusion. The left lung is otherwise clear. The visualized skeletal structures are unremarkable. IMPRESSION: 1. Possible small right apical pneumothorax. Likely ex  vacuo physiology. Recommend clinical correlation. 2. Persistent small to moderate right pleural effusion after thoracentesis. Electronically Signed   By: Ester Sides M.D.   On: 02/15/2024 10:31   US  THORACENTESIS ASP PLEURAL SPACE W/IMG GUIDE Result Date: 02/10/2024 INDICATION: 83 year old male. History of metastatic bladder cancer with recurrent bilateral pleural effusions. Request is for therapeutic thoracentesis. EXAM: ULTRASOUND GUIDED THERAPEUTIC RIGHT-SIDED THORACENTESIS MEDICATIONS: Lidocaine  1% 10 mL COMPLICATIONS: None immediate. PROCEDURE: An ultrasound guided thoracentesis was thoroughly discussed with the patient and questions answered. The benefits, risks, alternatives and complications were also discussed. The patient understands and wishes to proceed with the procedure. Written consent was obtained. Ultrasound was performed to localize and mark an adequate pocket of fluid in the right chest. The area was then prepped and draped in the normal sterile fashion. 1% Lidocaine  was used for local anesthesia. Under ultrasound guidance a 6 Fr Safe-T-Centesis catheter was introduced. Thoracentesis was performed. The catheter was removed and a dressing applied. FINDINGS: A total of approximately 1.3 L of cloudy yellow fluid was removed. IMPRESSION: Successful ultrasound guided therapeutic right-sided thoracentesis yielding 1.3 of pleural fluid. Performed by Delon Beagle NP Electronically Signed   By: Juliene Balder M.D.   On: 02/10/2024 10:18   DG Chest 1 View Result Date: 02/10/2024 CLINICAL DATA:  Status post right-sided thoracentesis. EXAM: CHEST  1 VIEW portable upright COMPARISON:  X-ray 02/03/2024 and older FINDINGS: Slight interval decrease in the right-sided pleural effusion. Significant residual. Adjacent lung opacities. Tiny left pleural effusion. Normal cardiopericardial silhouette. Tortuous ectatic aorta. There is some volume loss of shift of the mediastinum from left-to-right. No edema. No  pneumothorax. IMPRESSION: Interval slight decrease in right-sided pleural effusion post thoracentesis. No pneumothorax identified. Tiny left pleural effusion. Electronically Signed   By: Ranell Bring M.D.   On: 02/10/2024 09:28   DG Chest 1 View Addendum Date: 02/03/2024 ADDENDUM REPORT: 02/03/2024 13:06 ADDENDUM: Upon further review, a  tiny apical left pneumothorax (less than 5 percent) is identified. Addended critical Value/emergent results were called by telephone at the time of interpretation on 02/03/2024 at 1:05 pm to provider KEVIN BRUNING , who verbally acknowledged these results. Electronically Signed   By: Selinda DELENA Blue M.D.   On: 02/03/2024 13:06   Result Date: 02/03/2024 CLINICAL DATA:  [id number] Recurrent left pleural effusion [id number], post thoracentesis EXAM: CHEST  1 VIEW COMPARISON:  01/27/2024 chest radiograph. FINDINGS: Stable cardiomediastinal silhouette with normal heart size. No pneumothorax. No significant residual left pleural effusion. Chronic loculated moderate to large right pleural effusion, similar. Chronic volume loss in the right hemithorax and hazy opacity at the right lung base. No acute left lung opacity. IMPRESSION: 1. No pneumothorax. No significant residual left pleural effusion. 2. Chronic loculated moderate to large right pleural effusion, similar. 3. Chronic volume loss in the right hemithorax and hazy opacity at the right lung base, favor atelectasis. Electronically Signed: By: Selinda DELENA Blue M.D. On: 02/03/2024 13:00   US  THORACENTESIS ASP PLEURAL SPACE W/IMG GUIDE Result Date: 02/03/2024 INDICATION: Shortness of breath. Recurrent left pleural effusion. Request for therapeutic thoracentesis. EXAM: ULTRASOUND GUIDED LEFT THORACENTESIS MEDICATIONS: % plain lidocaine , COMPLICATIONS: None immediate. PROCEDURE: An ultrasound guided thoracentesis was thoroughly discussed with the patient and questions answered. The benefits, risks, alternatives and complications were also  discussed. The patient understands and wishes to proceed with the procedure. Written consent was obtained. Ultrasound was performed to localize and mark an adequate pocket of fluid in the left chest. The area was then prepped and draped in the normal sterile fashion. 1% Lidocaine  was used for local anesthesia. Under ultrasound guidance a 6 Fr Safe-T-Centesis catheter was introduced. Thoracentesis was performed. The catheter was removed and a dressing applied. FINDINGS: A total of approximately 600 mL of clear yellow fluid was removed. IMPRESSION: Successful ultrasound guided left thoracentesis yielding 600 mL of pleural fluid. Procedure performed by Franky Rusk PA-C and supervised by Dr. Wilkie Lent Electronically Signed   By: Wilkie Lent M.D.   On: 02/03/2024 10:31   US  THORACENTESIS ASP PLEURAL SPACE W/IMG GUIDE Result Date: 01/27/2024 INDICATION: Patient with history of metastatic bladder cancer, recurrent bilateral pleural effusions. Request for therapeutic right thoracentesis. EXAM: ULTRASOUND GUIDED RIGHT THORACENTESIS MEDICATIONS: 6 mL 1% lidocaine  COMPLICATIONS: None immediate. PROCEDURE: An ultrasound guided thoracentesis was thoroughly discussed with the patient and questions answered. The benefits, risks, alternatives and complications were also discussed. The patient understands and wishes to proceed with the procedure. Written consent was obtained. Ultrasound was performed to localize and mark an adequate pocket of fluid in the right chest. The area was then prepped and draped in the normal sterile fashion. 1% Lidocaine  was used for local anesthesia. Under ultrasound guidance a 19 gauge, 7-cm, Yueh catheter was introduced. Thoracentesis was performed. The catheter was removed and a dressing applied. FINDINGS: A total of approximately 1.6 L of clear yellow fluid was removed. IMPRESSION: Successful ultrasound guided right thoracentesis yielding 1.6 L of pleural fluid. Performed by Clotilda Hesselbach, PA-C Electronically Signed   By: Wilkie Lent M.D.   On: 01/27/2024 10:28   DG Chest 1 View Result Date: 01/27/2024 CLINICAL DATA:  Status post thoracentesis. EXAM: CHEST  1 VIEW COMPARISON:  January 20, 2024. FINDINGS: Stable cardiomediastinal silhouette. Minimal left basilar atelectasis is noted with small left pleural effusion. Stable small right pleural effusion. Stable right apical pleural thickening or loculated effusion with associated scarring. No definite pneumothorax is noted. IMPRESSION: Stable bilateral lung findings as  noted above. No definite pneumothorax. Electronically Signed   By: Lynwood Landy Raddle M.D.   On: 01/27/2024 09:43

## 2024-02-22 LAB — T4: T4, Total: 8.2 ug/dL (ref 4.5–12.0)

## 2024-02-24 ENCOUNTER — Other Ambulatory Visit: Payer: Self-pay | Admitting: Hematology

## 2024-02-24 ENCOUNTER — Ambulatory Visit (HOSPITAL_COMMUNITY)
Admission: RE | Admit: 2024-02-24 | Discharge: 2024-02-24 | Disposition: A | Discharge status: Home / Self Care | Source: Ambulatory Visit | Attending: Hematology | Admitting: Hematology

## 2024-02-24 ENCOUNTER — Encounter (HOSPITAL_COMMUNITY): Payer: Self-pay

## 2024-02-24 DIAGNOSIS — J90 Pleural effusion, not elsewhere classified: Secondary | ICD-10-CM | POA: Diagnosis not present

## 2024-02-24 DIAGNOSIS — Z48813 Encounter for surgical aftercare following surgery on the respiratory system: Secondary | ICD-10-CM | POA: Diagnosis not present

## 2024-02-24 DIAGNOSIS — R9389 Abnormal findings on diagnostic imaging of other specified body structures: Secondary | ICD-10-CM | POA: Diagnosis not present

## 2024-02-24 MED ORDER — LIDOCAINE HCL (PF) 2 % IJ SOLN
INTRAMUSCULAR | Status: AC
Start: 2024-02-24 — End: 2024-02-24
  Filled 2024-02-24: qty 10

## 2024-02-24 MED ORDER — LIDOCAINE HCL (PF) 2 % IJ SOLN
10.0000 mL | Freq: Once | INTRAMUSCULAR | Status: AC
  Administered 2024-02-24: 10 mL via INTRADERMAL

## 2024-02-24 NOTE — Procedures (Signed)
 PROCEDURE SUMMARY:  Successful image-guided right thoracentesis. Yielded 1.2L of  hazy yellow fluid. Pt tolerated procedure well. No immediate complications. EBL = trace   Specimen was not sent for labs. CXR ordered.  Please see imaging section of Epic for full dictation.  Chudney Scheffler H Chika Cichowski PA-C 02/24/2024 9:30 AM

## 2024-02-24 NOTE — Progress Notes (Signed)
 Patient brought to US  RM1 in no acute distress.Thoracentesis explained, consent obtained. Local anesthetic admin with no adverse reaction, access obtained at Right pos thorax. Clear yeliow serous fluid retrieved under vacutainer suction. 1.2L total retreived, Access removed without difficulty. Bandage placed. No bleeding or hematoma. Transported to radiology waiting per [facility] to care of spouse.

## 2024-03-02 ENCOUNTER — Encounter (HOSPITAL_COMMUNITY): Payer: Self-pay

## 2024-03-02 ENCOUNTER — Ambulatory Visit (HOSPITAL_COMMUNITY)
Admission: RE | Admit: 2024-03-02 | Discharge: 2024-03-02 | Disposition: A | Discharge status: Home / Self Care | Source: Ambulatory Visit | Attending: Hematology | Admitting: Hematology

## 2024-03-02 ENCOUNTER — Ambulatory Visit (HOSPITAL_COMMUNITY)
Admission: RE | Admit: 2024-03-02 | Discharge: 2024-03-02 | Disposition: A | Discharge status: Home / Self Care | Source: Ambulatory Visit | Attending: Physician Assistant | Admitting: Physician Assistant

## 2024-03-02 DIAGNOSIS — R918 Other nonspecific abnormal finding of lung field: Secondary | ICD-10-CM | POA: Diagnosis not present

## 2024-03-02 DIAGNOSIS — J90 Pleural effusion, not elsewhere classified: Secondary | ICD-10-CM | POA: Diagnosis not present

## 2024-03-02 MED ORDER — LIDOCAINE HCL (PF) 2 % IJ SOLN
INTRAMUSCULAR | Status: AC
  Filled 2024-03-02: qty 10

## 2024-03-02 MED ORDER — LIDOCAINE HCL (PF) 2 % IJ SOLN
10.0000 mL | Freq: Once | INTRAMUSCULAR | Status: AC
  Administered 2024-03-02: 10 mL

## 2024-03-02 NOTE — Progress Notes (Signed)
 Patient tolerated Right Thoracentesis procedure well today and 1.2 Liters of pleural fluid removed. Patient verbalized understanding of discharge instructions and transported via wheelchair to xray at this time for post chest xray with no acute distress noted.

## 2024-03-02 NOTE — Procedures (Signed)
 PROCEDURE SUMMARY:  Successful image-guided right thoracentesis. Yielded 1.2 liters of clear yellow fluid. Patient tolerated procedure well. EBL: Zero No immediate complications.  Specimen was not sent for labs. Post procedure CXR shows no pneumothorax.  Please see imaging section of Epic for full dictation.  Clotilda DELENA Hesselbach PA-C 03/02/2024 9:56 AM

## 2024-03-09 ENCOUNTER — Encounter (HOSPITAL_COMMUNITY): Payer: Self-pay

## 2024-03-09 ENCOUNTER — Ambulatory Visit (HOSPITAL_COMMUNITY)
Admission: RE | Admit: 2024-03-09 | Discharge: 2024-03-09 | Disposition: A | Discharge status: Home / Self Care | Source: Ambulatory Visit | Attending: Physician Assistant | Admitting: Physician Assistant

## 2024-03-09 ENCOUNTER — Ambulatory Visit (HOSPITAL_COMMUNITY)
Admission: RE | Admit: 2024-03-09 | Discharge: 2024-03-09 | Disposition: A | Discharge status: Home / Self Care | Source: Ambulatory Visit | Attending: Hematology | Admitting: Hematology

## 2024-03-09 DIAGNOSIS — Z48813 Encounter for surgical aftercare following surgery on the respiratory system: Secondary | ICD-10-CM | POA: Diagnosis not present

## 2024-03-09 DIAGNOSIS — J90 Pleural effusion, not elsewhere classified: Secondary | ICD-10-CM | POA: Diagnosis not present

## 2024-03-09 DIAGNOSIS — R918 Other nonspecific abnormal finding of lung field: Secondary | ICD-10-CM | POA: Diagnosis not present

## 2024-03-09 DIAGNOSIS — C679 Malignant neoplasm of bladder, unspecified: Secondary | ICD-10-CM | POA: Insufficient documentation

## 2024-03-09 MED ORDER — LIDOCAINE HCL (PF) 2 % IJ SOLN
INTRAMUSCULAR | Status: AC
Start: 2024-03-09 — End: 2024-03-09
  Filled 2024-03-09: qty 20

## 2024-03-09 MED ORDER — LIDOCAINE HCL (PF) 2 % IJ SOLN
10.0000 mL | Freq: Once | INTRAMUSCULAR | Status: AC
  Administered 2024-03-09: 10 mL

## 2024-03-09 NOTE — Progress Notes (Signed)
 Patient tolerated Right Thoracentesis procedure well today and 1.2 Liters of yellow pleural fluid removed. Patient verbalized understanding of discharge instructions and transported via wheelchair to xray at this time for post chest xray with no acute distress noted.

## 2024-03-09 NOTE — Procedures (Signed)
 PROCEDURE SUMMARY:  Successful image-guided right thoracentesis. Yielded 1.2 liters of clear yellow fluid. Patient tolerated procedure well. EBL < 1 mL No immediate complications.  Specimen was not sent for labs. Post procedure CXR shows no pneumothorax.  Please see imaging section of Epic for full dictation.  Clotilda DELENA Hesselbach PA-C 03/09/2024 9:26 AM

## 2024-03-12 ENCOUNTER — Inpatient Hospital Stay: Admitting: Hematology

## 2024-03-12 ENCOUNTER — Inpatient Hospital Stay

## 2024-03-12 ENCOUNTER — Encounter: Payer: Self-pay | Admitting: Hematology

## 2024-03-12 ENCOUNTER — Inpatient Hospital Stay: Admitting: Dietician

## 2024-03-12 VITALS — BP 116/79 | HR 91 | Temp 97.2°F | Resp 18

## 2024-03-12 DIAGNOSIS — C7802 Secondary malignant neoplasm of left lung: Secondary | ICD-10-CM | POA: Diagnosis not present

## 2024-03-12 DIAGNOSIS — Z5112 Encounter for antineoplastic immunotherapy: Secondary | ICD-10-CM | POA: Diagnosis not present

## 2024-03-12 DIAGNOSIS — C7801 Secondary malignant neoplasm of right lung: Secondary | ICD-10-CM | POA: Diagnosis not present

## 2024-03-12 DIAGNOSIS — C679 Malignant neoplasm of bladder, unspecified: Secondary | ICD-10-CM

## 2024-03-12 DIAGNOSIS — C7951 Secondary malignant neoplasm of bone: Secondary | ICD-10-CM | POA: Diagnosis not present

## 2024-03-12 DIAGNOSIS — Z79620 Long term (current) use of immunosuppressive biologic: Secondary | ICD-10-CM | POA: Diagnosis not present

## 2024-03-12 LAB — COMPREHENSIVE METABOLIC PANEL WITH GFR
ALT: 14 U/L (ref 0–44)
AST: 19 U/L (ref 15–41)
Albumin: 2.7 g/dL — ABNORMAL LOW (ref 3.5–5.0)
Alkaline Phosphatase: 47 U/L (ref 38–126)
Anion gap: 8 (ref 5–15)
BUN: 22 mg/dL (ref 8–23)
CO2: 24 mmol/L (ref 22–32)
Calcium: 8.7 mg/dL — ABNORMAL LOW (ref 8.9–10.3)
Chloride: 108 mmol/L (ref 98–111)
Creatinine, Ser: 1 mg/dL (ref 0.61–1.24)
GFR, Estimated: >60 mL/min (ref >60)
Glucose, Bld: 91 mg/dL (ref 70–99)
Potassium: 4.4 mmol/L (ref 3.5–5.1)
Sodium: 140 mmol/L (ref 135–145)
Total Bilirubin: 0.8 mg/dL (ref 0.0–1.2)
Total Protein: 6 g/dL — ABNORMAL LOW (ref 6.5–8.1)

## 2024-03-12 LAB — CBC WITH DIFFERENTIAL/PLATELET
Abs Immature Granulocytes: 0.09 K/uL — ABNORMAL HIGH (ref 0.00–0.07)
Basophils Absolute: 0.1 K/uL (ref 0.0–0.1)
Basophils Relative: 1 %
Eosinophils Absolute: 0.7 K/uL — ABNORMAL HIGH (ref 0.0–0.5)
Eosinophils Relative: 7 %
HCT: 47.1 % (ref 39.0–52.0)
Hemoglobin: 15.4 g/dL (ref 13.0–17.0)
Immature Granulocytes: 1 %
Lymphocytes Relative: 6 %
Lymphs Abs: 0.6 K/uL — ABNORMAL LOW (ref 0.7–4.0)
MCH: 34.5 pg — ABNORMAL HIGH (ref 26.0–34.0)
MCHC: 32.7 g/dL (ref 30.0–36.0)
MCV: 105.6 fL — ABNORMAL HIGH (ref 80.0–100.0)
Monocytes Absolute: 1 K/uL (ref 0.1–1.0)
Monocytes Relative: 9 %
Neutro Abs: 8.6 K/uL — ABNORMAL HIGH (ref 1.7–7.7)
Neutrophils Relative %: 76 %
Platelets: 253 K/uL (ref 150–400)
RBC: 4.46 MIL/uL (ref 4.22–5.81)
RDW: 13.9 % (ref 11.5–15.5)
WBC: 11.2 K/uL — ABNORMAL HIGH (ref 4.0–10.5)
nRBC: 0 % (ref 0.0–0.2)

## 2024-03-12 LAB — MAGNESIUM: Magnesium: 2.5 mg/dL — ABNORMAL HIGH (ref 1.7–2.4)

## 2024-03-12 LAB — TSH: TSH: 4.286 u[IU]/mL (ref 0.350–4.500)

## 2024-03-12 MED ORDER — SODIUM CHLORIDE 0.9 % IV SOLN
200.0000 mg | Freq: Once | INTRAVENOUS | Status: AC
  Administered 2024-03-12: 200 mg via INTRAVENOUS
  Filled 2024-03-12: qty 8

## 2024-03-12 MED ORDER — SODIUM CHLORIDE 0.9 % IV SOLN: Freq: Once | INTRAVENOUS | Status: AC

## 2024-03-12 NOTE — Patient Instructions (Signed)
 [facility] [place] - A DEPT OF [facility]  Discharge Instructions: Thank you for choosing  Cancer Center to provide your oncology and hematology care.  If you have a lab appointment with the Cancer Center - please note that after April 8th, 2024, all labs will be drawn in the cancer center.  You do not have to check in or register with the main entrance as you have in the past but will complete your check-in in the cancer center.  Wear comfortable clothing and clothing appropriate for easy access to any Portacath or PICC line.   We strive to give you quality time with your provider. You may need to reschedule your appointment if you arrive late (15 or more minutes).  Arriving late affects you and other patients whose appointments are after yours.  Also, if you miss three or more appointments without notifying the office, you may be dismissed from the clinic at the provider's discretion.      For prescription refill requests, have your pharmacy contact our office and allow 72 hours for refills to be completed.    Today you received the following chemotherapy and/or immunotherapy agents Keytruda. Pembrolizumab Injection What is this medication? PEMBROLIZUMAB (PEM broe LIZ ue mab) treats some types of cancer. It works by helping your immune system slow or stop the spread of cancer cells. It is a monoclonal antibody. This medicine may be used for other purposes; ask your health care provider or pharmacist if you have questions. COMMON BRAND NAME(S): Keytruda What should I tell my care team before I take this medication? They need to know if you have any of these conditions: Allogeneic stem cell transplant (uses someone else's stem cells) Autoimmune diseases, such as Crohn disease, ulcerative colitis, lupus History of chest radiation Nervous system problems, such as Guillain-Barre syndrome, myasthenia gravis Organ transplant An unusual or allergic reaction to  pembrolizumab, other medications, foods, dyes, or preservatives Pregnant or trying to get pregnant Breast-feeding How should I use this medication? This medication is injected into a vein. It is given by your care team in a hospital or clinic setting. A special MedGuide will be given to you before each treatment. Be sure to read this information carefully each time. Talk to your care team about the use of this medication in children. While it may be prescribed for children as young as 6 months for selected conditions, precautions do apply. Overdosage: If you think you have taken too much of this medicine contact a poison control center or emergency room at once. NOTE: This medicine is only for you. Do not share this medicine with others. What if I miss a dose? Keep appointments for follow-up doses. It is important not to miss your dose. Call your care team if you are unable to keep an appointment. What may interact with this medication? Interactions have not been studied. This list may not describe all possible interactions. Give your health care provider a list of all the medicines, herbs, non-prescription drugs, or dietary supplements you use. Also tell them if you smoke, drink alcohol, or use illegal drugs. Some items may interact with your medicine. What should I watch for while using this medication? Your condition will be monitored carefully while you are receiving this medication. You may need blood work while taking this medication. This medication may cause serious skin reactions. They can happen weeks to months after starting the medication. Contact your care team right away if you notice  fevers or flu-like symptoms with a rash. The rash may be red or purple and then turn into blisters or peeling of the skin. You may also notice a red rash with swelling of the face, lips, or lymph nodes in your neck or under your arms. Tell your care team right away if you have any change in your  eyesight. Talk to your care team if you may be pregnant. Serious birth defects can occur if you take this medication during pregnancy and for 4 months after the last dose. You will need a negative pregnancy test before starting this medication. Contraception is recommended while taking this medication and for 4 months after the last dose. Your care team can help you find the option that works for you. Do not breastfeed while taking this medication and for 4 months after the last dose. What side effects may I notice from receiving this medication? Side effects that you should report to your care team as soon as possible: Allergic reactions--skin rash, itching, hives, swelling of the face, lips, tongue, or throat Dry cough, shortness of breath or trouble breathing Eye pain, redness, irritation, or discharge with blurry or decreased vision Heart muscle inflammation--unusual weakness or fatigue, shortness of breath, chest pain, fast or irregular heartbeat, dizziness, swelling of the ankles, feet, or hands Hormone gland problems--headache, sensitivity to light, unusual weakness or fatigue, dizziness, fast or irregular heartbeat, increased sensitivity to cold or heat, excessive sweating, constipation, hair loss, increased thirst or amount of urine, tremors or shaking, irritability Infusion reactions--chest pain, shortness of breath or trouble breathing, feeling faint or lightheaded Kidney injury (glomerulonephritis)--decrease in the amount of urine, red or dark brown urine, foamy or bubbly urine, swelling of the ankles, hands, or feet Liver injury--right upper belly pain, loss of appetite, nausea, light-colored stool, dark yellow or brown urine, yellowing skin or eyes, unusual weakness or fatigue Pain, tingling, or numbness in the hands or feet, muscle weakness, change in vision, confusion or trouble speaking, loss of balance or coordination, trouble walking, seizures Rash, fever, and swollen lymph  nodes Redness, blistering, peeling, or loosening of the skin, including inside the mouth Sudden or severe stomach pain, bloody diarrhea, fever, nausea, vomiting Side effects that usually do not require medical attention (report to your care team if they continue or are bothersome): Bone, joint, or muscle pain Diarrhea Fatigue Loss of appetite Nausea Skin rash This list may not describe all possible side effects. Call your doctor for medical advice about side effects. You may report side effects to FDA at 1-800-FDA-1088. Where should I keep my medication? This medication is given in a hospital or clinic. It will not be stored at home. NOTE: This sheet is a summary. It may not cover all possible information. If you have questions about this medicine, talk to your doctor, pharmacist, or health care provider.  2024 Elsevier/Gold Standard (2021-12-15 00:00:00)       To help prevent nausea and vomiting after your treatment, we encourage you to take your nausea medication as directed.  BELOW ARE SYMPTOMS THAT SHOULD BE REPORTED IMMEDIATELY: *FEVER GREATER THAN 100.4 F (38 C) OR HIGHER *CHILLS OR SWEATING *NAUSEA AND VOMITING THAT IS NOT CONTROLLED WITH YOUR NAUSEA MEDICATION *UNUSUAL SHORTNESS OF BREATH *UNUSUAL BRUISING OR BLEEDING *URINARY PROBLEMS (pain or burning when urinating, or frequent urination) *BOWEL PROBLEMS (unusual diarrhea, constipation, pain near the anus) TENDERNESS IN MOUTH AND THROAT WITH OR WITHOUT PRESENCE OF ULCERS (sore throat, sores in mouth, or a toothache) UNUSUAL RASH, SWELLING  OR PAIN  UNUSUAL VAGINAL DISCHARGE OR ITCHING   Items with * indicate a potential emergency and should be followed up as soon as possible or go to the Emergency Department if any problems should occur.  Please show the CHEMOTHERAPY ALERT CARD or IMMUNOTHERAPY ALERT CARD at check-in to the Emergency Department and triage nurse.  Should you have questions after your visit or need to  cancel or reschedule your appointment, please contact [facility] CANCER CTR [place] - A DEPT OF [facility] [phone]  and follow the prompts.  Office hours are 8:00 a.m. to 4:30 p.m. Monday - Friday. Please note that voicemails left after 4:00 p.m. may not be returned until the following business day.  We are closed weekends and major holidays. You have access to a nurse at all times for urgent questions. Please call the main number to the clinic [phone] and follow the prompts.  For any non-urgent questions, you may also contact your provider using MyChart. We now offer e-Visits for anyone 42 and older to request care online for non-urgent symptoms. For details visit [email or web address].   Also download the MyChart app! Go to the app store, search "MyChart", open the app, select [place], and log in with your MyChart username and password.

## 2024-03-12 NOTE — Progress Notes (Signed)
 Patient presents today for chemotherapy infusion.  Patient is in satisfactory condition with no new complaints voiced.  Vital signs are stable.  Labs reviewed and all labs are within treatment parameters.  IV placed in L hand after multiple attempts by multiple RN.  We will proceed with treatment per MD orders.    Patient tolerated treatment well with no complaints voiced.  Patient left via wheelchair with wife in stable condition.  Vital signs stable at discharge.  Follow up as scheduled.

## 2024-03-13 ENCOUNTER — Other Ambulatory Visit

## 2024-03-13 ENCOUNTER — Ambulatory Visit

## 2024-03-13 LAB — T4: T4, Total: 8 ug/dL (ref 4.5–12.0)

## 2024-03-16 ENCOUNTER — Encounter (HOSPITAL_COMMUNITY): Payer: Self-pay

## 2024-03-16 ENCOUNTER — Ambulatory Visit (HOSPITAL_COMMUNITY)
Admission: RE | Admit: 2024-03-16 | Discharge: 2024-03-16 | Disposition: A | Discharge status: Home / Self Care | Source: Ambulatory Visit | Attending: Radiology | Admitting: Radiology

## 2024-03-16 ENCOUNTER — Ambulatory Visit (HOSPITAL_COMMUNITY)
Admission: RE | Admit: 2024-03-16 | Discharge: 2024-03-16 | Disposition: A | Discharge status: Home / Self Care | Source: Ambulatory Visit | Attending: Hematology | Admitting: Hematology

## 2024-03-16 ENCOUNTER — Other Ambulatory Visit (HOSPITAL_COMMUNITY)

## 2024-03-16 DIAGNOSIS — J90 Pleural effusion, not elsewhere classified: Secondary | ICD-10-CM | POA: Insufficient documentation

## 2024-03-16 DIAGNOSIS — R918 Other nonspecific abnormal finding of lung field: Secondary | ICD-10-CM | POA: Insufficient documentation

## 2024-03-16 DIAGNOSIS — Z48813 Encounter for surgical aftercare following surgery on the respiratory system: Secondary | ICD-10-CM | POA: Diagnosis not present

## 2024-03-16 DIAGNOSIS — Z9889 Other specified postprocedural states: Secondary | ICD-10-CM | POA: Insufficient documentation

## 2024-03-16 MED ORDER — LIDOCAINE HCL (PF) 2 % IJ SOLN
INTRAMUSCULAR | Status: AC
  Filled 2024-03-16: qty 20

## 2024-03-16 MED ORDER — LIDOCAINE HCL (PF) 2 % IJ SOLN
10.0000 mL | Freq: Once | INTRAMUSCULAR | Status: AC
  Administered 2024-03-16: 10 mL

## 2024-03-16 NOTE — Progress Notes (Signed)
 Patient tolerated Right sided Thoracentesis procedure well today and 1.1 Liters of pleural fluid removed. Patient verbalized understanding of discharges instructions and transported via wheelchair to xray at this time for post chest xray with no acute distress noted.

## 2024-03-19 ENCOUNTER — Inpatient Hospital Stay: Attending: Oncology | Admitting: Dietician

## 2024-03-19 ENCOUNTER — Telehealth: Payer: Self-pay | Admitting: Dietician

## 2024-03-19 DIAGNOSIS — C7801 Secondary malignant neoplasm of right lung: Secondary | ICD-10-CM | POA: Insufficient documentation

## 2024-03-19 DIAGNOSIS — Z79620 Long term (current) use of immunosuppressive biologic: Secondary | ICD-10-CM | POA: Insufficient documentation

## 2024-03-19 DIAGNOSIS — C679 Malignant neoplasm of bladder, unspecified: Secondary | ICD-10-CM | POA: Insufficient documentation

## 2024-03-19 DIAGNOSIS — C7802 Secondary malignant neoplasm of left lung: Secondary | ICD-10-CM | POA: Insufficient documentation

## 2024-03-19 DIAGNOSIS — C7951 Secondary malignant neoplasm of bone: Secondary | ICD-10-CM | POA: Insufficient documentation

## 2024-03-19 DIAGNOSIS — Z5112 Encounter for antineoplastic immunotherapy: Secondary | ICD-10-CM | POA: Insufficient documentation

## 2024-03-19 NOTE — Telephone Encounter (Signed)
 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 He is receiving keytruda  q21d.   Spoke with wife of pt via telephone for nutrition follow-up. Wife reports patient does not want real food anymore. He is eating ice cream, SF popsicles, tomato sandwiches with mayo, and watermelon. Wife says he goes through box of popsicles every 3 days. Patient is also drinking 3-4 Boost Plus. She is concerned about his sugar. Requested RD to review last labs (7/28) which showed normal glucose. Patient takes stool softener as needed for constipation.    Medications: reviewed   Labs: 7/28 - albumin 2.7 (increased from 2.6 on 7/8)  Anthropometrics: Last wt 176 lb on 7/8 - increased from 162 lb on 5/27  5/6 - 166 lb 12.8 oz    NUTRITION DIAGNOSIS: Unintended wt loss - improved    INTERVENTION:  Encourage high calorie high protein foods for wt maintenance Continue 3 Boost Plus/equivalent    MONITORING, EVALUATION, GOAL: wt trends, intake   NEXT VISIT: Monday August 25 via telephone (wife aware)

## 2024-03-23 ENCOUNTER — Encounter (HOSPITAL_COMMUNITY): Payer: Self-pay

## 2024-03-23 ENCOUNTER — Ambulatory Visit (HOSPITAL_COMMUNITY)
Admission: RE | Admit: 2024-03-23 | Discharge: 2024-03-23 | Disposition: A | Discharge status: Home / Self Care | Source: Ambulatory Visit | Attending: Physician Assistant | Admitting: Physician Assistant

## 2024-03-23 ENCOUNTER — Other Ambulatory Visit (HOSPITAL_COMMUNITY)

## 2024-03-23 ENCOUNTER — Ambulatory Visit (HOSPITAL_COMMUNITY)
Admission: RE | Admit: 2024-03-23 | Discharge: 2024-03-23 | Disposition: A | Discharge status: Home / Self Care | Source: Ambulatory Visit | Attending: Hematology | Admitting: Hematology

## 2024-03-23 DIAGNOSIS — J90 Pleural effusion, not elsewhere classified: Secondary | ICD-10-CM | POA: Insufficient documentation

## 2024-03-23 DIAGNOSIS — Z8551 Personal history of malignant neoplasm of bladder: Secondary | ICD-10-CM | POA: Diagnosis not present

## 2024-03-23 DIAGNOSIS — R918 Other nonspecific abnormal finding of lung field: Secondary | ICD-10-CM | POA: Diagnosis not present

## 2024-03-23 DIAGNOSIS — Z48813 Encounter for surgical aftercare following surgery on the respiratory system: Secondary | ICD-10-CM | POA: Diagnosis not present

## 2024-03-23 DIAGNOSIS — R0989 Other specified symptoms and signs involving the circulatory and respiratory systems: Secondary | ICD-10-CM | POA: Diagnosis not present

## 2024-03-23 MED ORDER — LIDOCAINE HCL (PF) 2 % IJ SOLN
10.0000 mL | Freq: Once | INTRAMUSCULAR | Status: AC
  Administered 2024-03-23: 10 mL

## 2024-03-23 MED ORDER — LIDOCAINE HCL (PF) 2 % IJ SOLN
INTRAMUSCULAR | Status: AC
  Filled 2024-03-23: qty 10

## 2024-03-23 NOTE — Progress Notes (Signed)
 Patient tolerated Right sided Thoracentesis procedure well today and 1.1 Liters of clear yellow pleural fluid removed. Patient verbalized understanding of discharge instructions and transported via wheelchair to xray at this time for post chest xray with no acute distress noted.

## 2024-03-23 NOTE — Procedures (Signed)
 PROCEDURE SUMMARY:  Successful image-guided right thoracentesis. Yielded 1.1 liters of clear yellow fluid. Patient tolerated procedure well. EBL: Zero No immediate complications.  Specimen was not sent for labs. Post procedure CXR shows no pneumothorax.  Please see imaging section of Epic for full dictation.  Clotilda DELENA Hesselbach PA-C 03/23/2024 10:26 AM

## 2024-03-27 ENCOUNTER — Ambulatory Visit (HOSPITAL_COMMUNITY)
Admission: RE | Admit: 2024-03-27 | Discharge: 2024-03-27 | Disposition: A | Discharge status: Home / Self Care | Source: Ambulatory Visit | Attending: Hematology | Admitting: Hematology

## 2024-03-27 DIAGNOSIS — C771 Secondary and unspecified malignant neoplasm of intrathoracic lymph nodes: Secondary | ICD-10-CM | POA: Diagnosis not present

## 2024-03-27 DIAGNOSIS — C7951 Secondary malignant neoplasm of bone: Secondary | ICD-10-CM | POA: Diagnosis not present

## 2024-03-27 DIAGNOSIS — C679 Malignant neoplasm of bladder, unspecified: Secondary | ICD-10-CM | POA: Insufficient documentation

## 2024-03-27 DIAGNOSIS — J90 Pleural effusion, not elsewhere classified: Secondary | ICD-10-CM | POA: Diagnosis not present

## 2024-03-27 MED ORDER — IOHEXOL 300 MG/ML  SOLN
100.0000 mL | Freq: Once | INTRAMUSCULAR | Status: AC | PRN
  Administered 2024-03-27 (×2): 100 mL via INTRAVENOUS

## 2024-03-30 ENCOUNTER — Ambulatory Visit (HOSPITAL_COMMUNITY)
Admission: RE | Admit: 2024-03-30 | Discharge: 2024-03-30 | Disposition: A | Discharge status: Home / Self Care | Source: Ambulatory Visit | Attending: Physician Assistant | Admitting: Physician Assistant

## 2024-03-30 ENCOUNTER — Other Ambulatory Visit: Payer: Self-pay

## 2024-03-30 ENCOUNTER — Ambulatory Visit (HOSPITAL_COMMUNITY)
Admission: RE | Admit: 2024-03-30 | Discharge: 2024-03-30 | Disposition: A | Discharge status: Home / Self Care | Source: Ambulatory Visit | Attending: Hematology | Admitting: Hematology

## 2024-03-30 ENCOUNTER — Other Ambulatory Visit (HOSPITAL_COMMUNITY)

## 2024-03-30 DIAGNOSIS — Z48813 Encounter for surgical aftercare following surgery on the respiratory system: Secondary | ICD-10-CM | POA: Diagnosis not present

## 2024-03-30 DIAGNOSIS — C799 Secondary malignant neoplasm of unspecified site: Secondary | ICD-10-CM | POA: Insufficient documentation

## 2024-03-30 DIAGNOSIS — C679 Malignant neoplasm of bladder, unspecified: Secondary | ICD-10-CM | POA: Diagnosis not present

## 2024-03-30 DIAGNOSIS — J90 Pleural effusion, not elsewhere classified: Secondary | ICD-10-CM | POA: Diagnosis not present

## 2024-03-30 DIAGNOSIS — I517 Cardiomegaly: Secondary | ICD-10-CM | POA: Diagnosis not present

## 2024-03-30 MED ORDER — LIDOCAINE HCL (PF) 2 % IJ SOLN
INTRAMUSCULAR | Status: AC
  Filled 2024-03-30: qty 10

## 2024-03-30 MED ORDER — LIDOCAINE HCL (PF) 2 % IJ SOLN
10.0000 mL | Freq: Once | INTRAMUSCULAR | Status: AC
  Administered 2024-03-30: 10 mL via INTRADERMAL

## 2024-03-30 NOTE — Progress Notes (Incomplete)
 Patient brought to US  Rm1 per WC in no obvious distress. Right thoracentesis explained, consent obtained. Prepped and draped with sterile technique. Local anesthetic admin without adverse reaction. US  imaging obtained. Access obtained at Right post thorax, 1100 cc clear amber fluid retrieved. Access removed without complication, bandage placed. No bleeding or hematoma.

## 2024-03-30 NOTE — Procedures (Signed)
 PROCEDURE SUMMARY:  Successful image-guided right thoracentesis. Yielded 1.1 liters of clear, amber fluid. Patient tolerated procedure well. EBL < 1 mL No immediate complications.  Specimen was not sent for labs. Post procedure CXR shows no pneumothorax.  Please see imaging section of Epic for full dictation.  Clotilda DELENA Hesselbach PA-C 03/30/2024 9:06 AM

## 2024-04-02 NOTE — Progress Notes (Incomplete)
 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 male was transferred to my care after his prior physician has left.   ASSESSMENT & PLAN:   Assessment & Plan: [name]  is a 83 y.o. 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  Complicated oncology history as below Started on Keytruda  last year with for unexplained pleural effusions and lung nodules. Path unavailable and cytology from pleural effusion did not show any malignant cells  -Patient has no complications from Keytruda .  Tolerating well. - We reviewed the recent CT scan together.  Stable nodule along with stable pleural effusion. - Continue Keytruda  200 mg every 3 weeks. -If patient has no improvement in his pleural effusion or lung lesions, will consider discussing possible biopsy Vs treatment break to assess for response as patient did not have a biopsy proven diagnosis prior to the start of Keytruda  - Labs reviewed today: CMP: Creatinine: Normal, LFTs: Normal, CBC: Leukocytosis present but no anemia or thrombocytopenia.  TSH: Normal - Repeat CT scan in 3 months.  Due 06/2024  Return to clinic in 6 weeks with labs prior to infusion Pleural effusion Patient has recurrent pleural effusion in bilateral lungs along with some lung nodules. Cytology negative for malignant cells Getting frequent thoracentesis usually right greater than left, mostly weekly  - Patient is  reluctant to get Pleurx catheter placed. - Continue weekly thoracentesis as scheduled. Rash Patient has a history of immunotherapy induced rash and is on prednisone  10 mg daily  - Continue prednisone  10 mg daily Chronic cough Patient reports chronic cough that is responsive to Robitussin as guaifenesin  Likely secondary to pleural effusions  - Continue guaifenesin  and Robitussin as needed    The patient understands the plans discussed today and is in agreement with them.  He knows to contact our office if he develops concerns prior to his next appointment.  60 minutes of total time was spent for this patient encounter, including preparation, face-to-face counseling with the patient and coordination of care, physical exam, and documentation of the encounter. > 50% of the time was spent on counseling as documented under my assessment and plan.    [name],acting as a Neurosurgeon for [name], MD.,have documented all relevant documentation on the behalf of [name], MD,as directed by  [name], MD while in the presence of [name], MD.  I, [name] MD, have reviewed the above documentation for accuracy and completeness, and I agree with the above.     [name], MD  [facility] [place] - A DEPT OF [facility] [address] [place] [address] Dept: [phone] Dept Fax: [phone]   Orders Placed This Encounter  Procedures   US  THORACENTESIS ASP PLEURAL SPACE W/IMG GUIDE    Standing Status:   Future    Expiration Date:   04/03/2025    Are labs required for specimen collection?:   No  Reason for Exam (SYMPTOM  OR DIAGNOSIS REQUIRED):   pleural effusion    Preferred imaging location?:   [facility]   US  THORACENTESIS ASP PLEURAL SPACE W/IMG GUIDE    Standing Status:   Future    Expiration Date:   04/03/2025    Are labs required for specimen collection?:   No     Reason for Exam (SYMPTOM  OR DIAGNOSIS REQUIRED):   pleural effusin    Preferred imaging location?:   [facility]   US  THORACENTESIS ASP PLEURAL SPACE W/IMG GUIDE    Standing Status:   Future    Expiration Date:   04/03/2025    Are labs required for specimen collection?:   No    Reason for Exam (SYMPTOM  OR DIAGNOSIS REQUIRED):   pleural effusion    Preferred imaging location?:   [facility]   US  THORACENTESIS ASP PLEURAL SPACE W/IMG GUIDE    Standing Status:   Future    Expiration Date:   04/03/2025    Are labs required for specimen collection?:   No    Reason for Exam (SYMPTOM  OR DIAGNOSIS REQUIRED):   pleural effusion    Preferred imaging location?:   [facility]   US  THORACENTESIS ASP PLEURAL SPACE W/IMG GUIDE    Standing Status:   Future    Expiration Date:   04/03/2025    Are labs required for specimen collection?:   No    Reason for Exam (SYMPTOM  OR DIAGNOSIS REQUIRED):   pleural effusion    Preferred imaging location?:   [facility]   US  THORACENTESIS ASP PLEURAL SPACE W/IMG GUIDE    Standing Status:   Future    Expiration Date:   04/03/2025    Are labs required for specimen collection?:   No    Reason for Exam (SYMPTOM  OR DIAGNOSIS REQUIRED):   pleural effusion    Preferred imaging location?:   [facility]   US  THORACENTESIS ASP PLEURAL SPACE W/IMG GUIDE    Standing Status:   Future    Expiration Date:   04/03/2025    Are labs required for specimen collection?:   No    Reason for Exam (SYMPTOM  OR DIAGNOSIS REQUIRED):   pleural effusion    Preferred imaging location?:   [facility]   US  THORACENTESIS ASP PLEURAL SPACE W/IMG GUIDE    Standing Status:   Future    Expiration Date:   04/03/2025    Are labs required for specimen collection?:   No    Reason for Exam (SYMPTOM  OR DIAGNOSIS REQUIRED):   pleural effusion    Preferred imaging location?:   [facility]   US  THORACENTESIS ASP PLEURAL SPACE W/IMG GUIDE    Standing  Status:   Future    Expiration Date:   04/03/2025    Are labs required for specimen collection?:   No    Reason for Exam (SYMPTOM  OR DIAGNOSIS REQUIRED):   pleural effusion    Preferred imaging location?:   [facility]   US  THORACENTESIS ASP PLEURAL SPACE W/IMG GUIDE    Standing Status:   Future    Expiration Date:   04/03/2025    Are labs required for specimen collection?:   No    Reason for Exam (SYMPTOM  OR DIAGNOSIS REQUIRED):   pleural effusion    Preferred imaging location?:   [facility]     ONCOLOGY HISTORY:  I have reviewed his chart and materials related to his cancer extensively and collaborated history with the patient. 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 There continues to be some borderline wall thickening in the jejunum, query chronic inflammatory process such as celiac disease. Hepatic cysts. Right renal hypodense lesions are probably cysts but technically too small to characterize. -12/31/2014: Cystoscopy with TURBT of two tumors on left lateral wall of bladder, 2 cm and 3 cm in size.  Pathology of transurethral resection: High-grade papillary urothelial carcinoma. No definite areas of invasive disease are identified. Smooth muscle is present for evaluation. TURBT Pathology: High-grade papillary urothelial carcinoma. No invasive disease is identified.  Smooth muscle is present for histologic evaluation. -12/2014-04/2015: Induction intravesical BCG  -08/12/2015: Bladder biopsy. Pathology: Non-invasive high-grade papillary urothelial carcinoma. Muscularis propia is present and not involved.  -08/2015-08/2016: Induction intravesical BCG followed by maintenance BCG -03/21/2017: TURBT. Pathology: Invasive high-grade papillary urothelial carcinoma involving detrusor  muscle.  -05/02/2017: Neoadjuvant chemotherapy with gemcitabine  and cisplatin . Just received 1 cycle and did not want further chemotherapy -06/29/2017: Cystoprostatectomy with bilateral lymphadenectomy.  Pathology: Infiltrative high-grade urothelial carcinoma, 2.5 cm tumor size. The carcinoma macroscopically invades perivesical soft tissue (pT3b). Lymphovascular and perineural invasion is identified. Margins of resection negative for carcinoma. 13/13 lymph nodes negative for metastatic carcinoma.  -10/2017: ??Likely MRI showing Right ischium lytic lesion -12/08/2017: Right ischium biopsy. Pathology: Poorly differentiated carcinoma. Comment: PD-L1 [id number] CPS: (90%) -01/16/2018: XRT to the pelvis completed -01/30/2018-05/22/2018: 6 cycles of Carboplatin  and gemcitabine  completed -03/31/2018: CT CAP: Redemonstration of bilateral pulmonary metastasis. The majority of nodules are similar in size. One index nodule has minimally decreased. Progression of right inferior pubic ramus osseous metastasis. No new sites of soft tissue metastasis identified. -06/19/2018: MRI hip: Large expansile and destructive centrally necrotic mass of the right inferior pubic ramus, with pathologic fracture along its anterior margin, and with notable soft tissue extension. -06/21/2018-12/13/2018: Pembrolizumab  every 3 weeks, discontinued due to skin toxicity -01/24/2019: CT CAP: The dominant left upper lobe pulmonary nodule is stable but the other smaller pulmonary nodules demonstrate reduced size/conspicuity. Essentially stable lytic mass of the right inferior pubic ramus. -03/05/2020: CT CAP: Enlarging right upper lobe pulmonary nodule compatible with enlarging neoplasm. Whether this represents a metastatic lesion or a primary bronchogenic neoplasm is uncertain. There is also an enlarged right paratracheal lymph node measuring 1.9 cm in short axis, new compared to the prior study, concerning for nodal metastatic disease. Large lytic  lesion in the right inferior pubic ramus, similar to prior examinations, compatible with a metastatic lesion. -04/23/2020: 50 Gray in 10 fractions XRT completed to right paratracheal lymph node -10/22/2021: PET:  Hypermetabolic subcarinal, AP window, and bilateral hilar adenopathy. The 1.4 cm subcarinal lymph node is particularly hypermetabolic maximum SUV 21.1. Low-grade activity in the right apical density, maximum SUV 3.1, probably representing sequela of prior radiation therapy. 9 mm anterior left upper lobe nodule has a maximum SUV of 1.6. The large lytic lesion of the right pubic bone and ischium is relatively photopenic, favoring treated malignancy. -11/16/2021-11/27/2021: SBRT to PET positive intrathoracic lymph nodes -03/15/2022: PET: Overall decrease in hypermetabolism involving mediastinal and hilar lymph nodes, compatible with response to therapy. Probable post treatment scarring in the apical segment right upper lobe. 10 mm nodular lesion in the anterior left upper lobe. Focal anorectal  hypermetabolism. Expansile lytic lesion and pathologic fracture involving the right inferior pubic ramus, without abnormal hypermetabolism, findings possibly representing treated metastatic disease. -11/25/2022: PET: No evidence of local recurrence of bladder cancer or metastatic disease. A previously demonstrated mildly hypermetabolic subcarinal lymph node has decreased in metabolic activity. New patchy ground-glass opacities medially in both upper lobes with associated low level metabolic activity, likely inflammatory/infectious. Enlarging bilateral pleural effusions with increased dependent atelectasis in both lungs. Stable treated lytic lesion in the right inferior pubic ramus and ischium. -12/20/2022: CT angio chest: Improved right pleural effusion with a small residual status post thoracentesis. No pneumothorax. Persistent left-sided pleural effusion. Multifocal areas of parenchymal opacity are again noted.  Separate spiculated 11 mm left upper lobe lung nodule. -12/20/2022: Right thoracentesis with 1.8 L of fluid removed -12/21/2022: Left thoracentesis with 700 mL of fluid removed. Cytology was negative for carcinoma.  (No path to prove urothelial carcinoma) -01/07/2023-current: Keytruda  200 mg every 3 weeks -03/27/2024: CT CAP: Post treatment collapse/consolidation in the posterior segment right upper lobe with a rounded masslike area of low attenuation posteromedially. Difficult to exclude residual disease. Stable anterior segment left upper lobe pulmonary nodule. Stable osseous metastatic disease. Cysto prostatectomy with a right lower quadrant ileal conduit. Large loculated right pleural effusion, stable, with rounded atelectasis in the posterior right lower lobe. Small to moderate left pleural effusion, decreased.  Current Treatment:  Keytruda  200 mg every 3 weeks  INTERVAL HISTORY:   [name] is here today for follow up. Patient is accompanied by his wife. His wife notes [name] has a productive cough with white sputum. He is taking Robitussin, which improves his cough for 5-6 hours at a time. He denies any SOB and is requiring thoracentesis weekly, typically right thoracentesis more than left. He does not wish to have a PleurX catheter.   [name] continues to have an itching rash along the right upper extremity and is taking Prednisone  10 mg. He denies any diarrhea due to Keytruda  which is controlled.   I have reviewed the past medical history, past surgical history, social history and family history with the patient and they are unchanged from previous note.  ALLERGIES:  is allergic to ciprofloxacin  and zofran  [ondansetron ].  MEDICATIONS:  Current Outpatient Medications  Medication Sig Dispense Refill   acetaminophen  (TYLENOL ) 500 MG tablet Take 1,000 mg by mouth every 6 (six) hours as needed for mild pain, fever, moderate pain or headache.     budesonide -formoterol  (SYMBICORT )  160-4.5 MCG/ACT inhaler Inhale 2 puffs into the lungs 2 (two) times daily. 1 each 6   levothyroxine  (SYNTHROID ) 75 MCG tablet Take 75 mcg by mouth daily.     metoprolol  tartrate (LOPRESSOR ) 25 MG tablet Take 1 tablet (25 mg total) by mouth 2 (two) times daily. 60 tablet 2   pantoprazole  (PROTONIX ) 40 MG tablet Take 1 tablet (40 mg total) by mouth 2 (two) times daily. 60 tablet 0   predniSONE  (DELTASONE ) 10 MG tablet Take 1 tablet (10 mg total) by mouth daily with breakfast. 90 tablet 3   hydrOXYzine  (ATARAX ) 25 MG tablet Take 1 tablet (25 mg total) by mouth 3 (three) times daily as needed. 90 tablet 0   megestrol  (MEGACE ) 400 MG/10ML suspension Take 10 mLs (400 mg total) by mouth 2 (two) times daily. (Patient not taking: Reported on 04/03/2024) 480 mL 3   methenamine  (HIPREX ) 1 g tablet Take 1 tablet (1 g total) by mouth 2 (two) times daily. (Patient not taking: Reported on 04/03/2024) 60 tablet  11   methylphenidate  (RITALIN ) 10 MG tablet TAKE 1 TAB DAILY IF NEEDED. MAX 3 TABS/DAY. (Patient not taking: Reported on 04/03/2024) 30 tablet 0   SSD 1 % cream Apply topically 2 (two) times daily. (Patient not taking: Reported on 04/03/2024)     Vitamin D, Ergocalciferol, (DRISDOL) 1.25 MG (50000 UNIT) CAPS capsule Take 50,000 Units by mouth once a week. (Patient not taking: Reported on 04/03/2024)     Current Facility-Administered Medications  Medication Dose Route Frequency Provider Last Rate Last Admin   omalizumab  (XOLAIR ) prefilled syringe 300 mg  300 mg Subcutaneous Q14 Days Iva Marty Saltness, MD   300 mg at 11/24/22 9143    REVIEW OF SYSTEMS:   Constitutional: Denies fevers, chills or abnormal weight loss Eyes: Denies blurriness of vision Ears, nose, mouth, throat, and face: Denies mucositis or sore throat Respiratory: Denies cough, dyspnea or wheezes Cardiovascular: Denies palpitation, chest discomfort or lower extremity swelling Gastrointestinal:  Denies nausea, heartburn or change in bowel  habits Skin: Denies abnormal skin rashes Lymphatics: Denies new lymphadenopathy or easy bruising Neurological:Denies numbness, tingling or new weaknesses Behavioral/Psych: Mood is stable, no new changes  All other systems were reviewed with the patient and are negative.   VITALS:  Blood pressure 137/77, pulse 86, temperature (!) 97.5 F (36.4 C), temperature source Tympanic, resp. rate 18, height 5' 11.5 (1.816 m), weight 179 lb (81.2 kg), SpO2 97%.  Wt Readings from Last 3 Encounters:  04/03/24 179 lb (81.2 kg)  02/21/24 176 lb (79.8 kg)  01/10/24 162 lb (73.5 kg)    Body mass index is 24.62 kg/m.  Performance status (ECOG): 2 - Symptomatic, <50% confined to bed  PHYSICAL EXAM:   GENERAL:alert, no distress and comfortable SKIN:Erythematous rash- multiple papular, tiny in the inner side of his right upper arm. LYMPH:  no palpable lymphadenopathy in the cervical, axillary or inguinal LUNGS: clear to auscultation and percussion with normal breathing effort HEART: regular rate & rhythm and no murmurs and no lower extremity edema ABDOMEN:abdomen soft, non-tender and normal bowel sounds Musculoskeletal:no cyanosis of digits and no clubbing  NEURO: alert & oriented x 3 with fluent speech, no focal motor/sensory deficits  LABORATORY DATA:  I have reviewed the data as listed    Component Value Date/Time   NA 139 04/03/2024 0942   NA 140 04/30/2019 1033   NA 141 05/23/2017 0828   K 4.2 04/03/2024 0942   K 4.0 05/23/2017 0828   CL 106 04/03/2024 0942   CO2 21 (L) 04/03/2024 0942   CO2 27 05/23/2017 0828   GLUCOSE 114 (H) 04/03/2024 0942   GLUCOSE 97 05/23/2017 0828   BUN 26 (H) 04/03/2024 0942   BUN 16 04/30/2019 1033   BUN 12.5 05/23/2017 0828   CREATININE 1.03 04/03/2024 0942   CREATININE 1.07 07/02/2022 0935   CREATININE 0.8 05/23/2017 0828   CALCIUM 9.0 04/03/2024 0942   CALCIUM 9.0 05/23/2017 0828   PROT 5.9 (L) 04/03/2024 0942   PROT 5.7 (L) 04/30/2019 1033   PROT  5.6 (L) 05/23/2017 0828   ALBUMIN 2.8 (L) 04/03/2024 0942   ALBUMIN 3.7 04/30/2019 1033   ALBUMIN 2.5 (L) 05/23/2017 0828   AST 18 04/03/2024 0942   AST 13 (L) 07/02/2022 0935   AST 13 05/23/2017 0828   ALT 18 04/03/2024 0942   ALT 15 07/02/2022 0935   ALT 15 05/23/2017 0828   ALKPHOS 44 04/03/2024 0942   ALKPHOS 57 05/23/2017 0828   BILITOT 0.6 04/03/2024 9057  BILITOT 0.5 07/02/2022 0935   BILITOT 0.37 05/23/2017 0828   GFRNONAA >60 04/03/2024 0942   GFRNONAA >60 07/02/2022 0935   GFRAA >60 03/05/2020 1407     Lab Results  Component Value Date   WBC 17.0 (H) 04/03/2024   NEUTROABS 14.1 (H) 04/03/2024   HGB 14.8 04/03/2024   HCT 45.6 04/03/2024   MCV 104.3 (H) 04/03/2024   PLT 322 04/03/2024     Latest Reference Range & Units 04/03/24 09:42  TSH 0.350 - 4.500 uIU/mL 4.077    RADIOGRAPHIC STUDIES: I have personally reviewed the radiological images as listed and agreed with the findings in the report.  CT CHEST ABDOMEN PELVIS W CONTRAST CLINICAL DATA:  Bladder cancer, evaluate for metastatic disease. * Tracking Code: BO *  EXAM: CT CHEST, ABDOMEN, AND PELVIS WITH CONTRAST  TECHNIQUE: Multidetector CT imaging of the chest, abdomen and pelvis was performed following the standard protocol during bolus administration of intravenous contrast.  RADIATION DOSE REDUCTION: This exam was performed according to the departmental dose-optimization program which includes automated exposure control, adjustment of the mA and/or kV according to patient size and/or use of iterative reconstruction technique.  CONTRAST:  OMNIPAQUE  IOHEXOL  300 MG/ML  SOLN  COMPARISON:  01/03/2024.  FINDINGS: CT CHEST FINDINGS  Cardiovascular: Atherosclerotic calcification of the aorta, aortic valve and coronary arteries. Heart size normal. No pericardial effusion.  Mediastinum/Nodes: No pathologically enlarged mediastinal, hilar or axillary lymph nodes. Soft tissue thickening in  the right hilum unchanged. Bilateral gynecomastia. Esophagus is grossly unremarkable.  Lungs/Pleura: Large loculated right pleural effusion, stable. Post radiation collapse/consolidation in the posterior segment right upper lobe, with overall improved aeration. However, there is a low-attenuation masslike component posteromedially, measuring 2.5 x 3.3 cm (2/21), also seen on 01/03/2024. Additional chronic appearing rounded atelectasis in the posterior right lower lobe. Spiculated anterior segment right upper lobe nodule measures 5 x 10 mm (3/30), stable. Small to moderate left effusion, minimally decreased in size. Post treatment narrowing of the distal right mainstem bronchus, bronchus intermedius and right upper and right middle lobe bronchi. Debris in the airway.  Musculoskeletal: Osteopenia.  T7 compression fracture, as before.  CT ABDOMEN PELVIS FINDINGS  Hepatobiliary: Hepatic cysts. Small gallstones. No biliary ductal dilatation.  Pancreas: Negative.  Spleen: Negative.  Adrenals/Urinary Tract: Adrenal glands are unremarkable. Renal cortical thinning bilaterally. Subcentimeter low-attenuation lesion in the right kidney, too small to characterize. No specific follow-up necessary. Ureters are decompressed. Cysto prostatectomy. Right lower quadrant ileal conduit.  Stomach/Bowel: Small hiatal hernia. Proximal gastric wall thickening, similar. Stomach and small bowel are unremarkable with the exception of an ileal conduit creation. Appendix is not readily visualized. Colon is unremarkable.  Vascular/Lymphatic: Atherosclerotic calcification of the aorta. No pathologically enlarged lymph nodes.  Reproductive: Cysto prostatectomy.  Other: No free fluid.  Mesenteries and peritoneum are unremarkable.  Musculoskeletal: Expansile lytic lesion and pathologic fracture in the right inferior pubic ramus, as before. Old right superior pubic ramus fracture. Degenerative changes in  the spine. Osteopenia.  IMPRESSION: 1. Post treatment collapse/consolidation in the posterior segment right upper lobe with a rounded masslike area of low attenuation posteromedially. Difficult to exclude residual disease. Recommend attention on follow-up. If a more aggressive approach is desired, PET could be considered. 2. Stable anterior segment left upper lobe pulmonary nodule. Again, recommend attention on follow-up. 3. Stable osseous metastatic disease. 4. Cysto prostatectomy with a right lower quadrant ileal conduit. 5. Large loculated right pleural effusion, stable, with rounded atelectasis in the posterior right lower lobe.  Small to moderate left pleural effusion, decreased. 6. Cholelithiasis. 7. Aortic atherosclerosis (ICD10-I70.0). Coronary artery calcification.  Electronically Signed   By: Newell Eke M.D.   On: 04/02/2024 09:09

## 2024-04-03 ENCOUNTER — Inpatient Hospital Stay

## 2024-04-03 ENCOUNTER — Encounter: Payer: Self-pay | Admitting: Oncology

## 2024-04-03 ENCOUNTER — Inpatient Hospital Stay (HOSPITAL_BASED_OUTPATIENT_CLINIC_OR_DEPARTMENT_OTHER): Admitting: Oncology

## 2024-04-03 VITALS — BP 113/66 | HR 73 | Resp 19

## 2024-04-03 VITALS — BP 137/77 | HR 86 | Temp 97.5°F | Resp 18 | Ht 71.5 in | Wt 179.0 lb

## 2024-04-03 DIAGNOSIS — J90 Pleural effusion, not elsewhere classified: Secondary | ICD-10-CM

## 2024-04-03 DIAGNOSIS — C679 Malignant neoplasm of bladder, unspecified: Secondary | ICD-10-CM | POA: Diagnosis not present

## 2024-04-03 DIAGNOSIS — R21 Rash and other nonspecific skin eruption: Secondary | ICD-10-CM | POA: Diagnosis not present

## 2024-04-03 DIAGNOSIS — Z79620 Long term (current) use of immunosuppressive biologic: Secondary | ICD-10-CM | POA: Diagnosis not present

## 2024-04-03 DIAGNOSIS — R059 Cough, unspecified: Secondary | ICD-10-CM | POA: Insufficient documentation

## 2024-04-03 DIAGNOSIS — Z5112 Encounter for antineoplastic immunotherapy: Secondary | ICD-10-CM | POA: Diagnosis not present

## 2024-04-03 DIAGNOSIS — C7802 Secondary malignant neoplasm of left lung: Secondary | ICD-10-CM | POA: Diagnosis not present

## 2024-04-03 DIAGNOSIS — R053 Chronic cough: Secondary | ICD-10-CM

## 2024-04-03 DIAGNOSIS — C7801 Secondary malignant neoplasm of right lung: Secondary | ICD-10-CM | POA: Diagnosis not present

## 2024-04-03 DIAGNOSIS — C7951 Secondary malignant neoplasm of bone: Secondary | ICD-10-CM | POA: Diagnosis not present

## 2024-04-03 LAB — COMPREHENSIVE METABOLIC PANEL WITH GFR
ALT: 18 U/L (ref 0–44)
AST: 18 U/L (ref 15–41)
Albumin: 2.8 g/dL — ABNORMAL LOW (ref 3.5–5.0)
Alkaline Phosphatase: 44 U/L (ref 38–126)
Anion gap: 12 (ref 5–15)
BUN: 26 mg/dL — ABNORMAL HIGH (ref 8–23)
CO2: 21 mmol/L — ABNORMAL LOW (ref 22–32)
Calcium: 9 mg/dL (ref 8.9–10.3)
Chloride: 106 mmol/L (ref 98–111)
Creatinine, Ser: 1.03 mg/dL (ref 0.61–1.24)
GFR, Estimated: >60 mL/min (ref >60)
Glucose, Bld: 114 mg/dL — ABNORMAL HIGH (ref 70–99)
Potassium: 4.2 mmol/L (ref 3.5–5.1)
Sodium: 139 mmol/L (ref 135–145)
Total Bilirubin: 0.6 mg/dL (ref 0.0–1.2)
Total Protein: 5.9 g/dL — ABNORMAL LOW (ref 6.5–8.1)

## 2024-04-03 LAB — CBC WITH DIFFERENTIAL/PLATELET
Abs Immature Granulocytes: 0.22 K/uL — ABNORMAL HIGH (ref 0.00–0.07)
Basophils Absolute: 0.2 K/uL — ABNORMAL HIGH (ref 0.0–0.1)
Basophils Relative: 1 %
Eosinophils Absolute: 0.6 K/uL — ABNORMAL HIGH (ref 0.0–0.5)
Eosinophils Relative: 3 %
HCT: 45.6 % (ref 39.0–52.0)
Hemoglobin: 14.8 g/dL (ref 13.0–17.0)
Immature Granulocytes: 1 %
Lymphocytes Relative: 4 %
Lymphs Abs: 0.7 K/uL (ref 0.7–4.0)
MCH: 33.9 pg (ref 26.0–34.0)
MCHC: 32.5 g/dL (ref 30.0–36.0)
MCV: 104.3 fL — ABNORMAL HIGH (ref 80.0–100.0)
Monocytes Absolute: 1.2 K/uL — ABNORMAL HIGH (ref 0.1–1.0)
Monocytes Relative: 7 %
Neutro Abs: 14.1 K/uL — ABNORMAL HIGH (ref 1.7–7.7)
Neutrophils Relative %: 84 %
Platelets: 322 K/uL (ref 150–400)
RBC: 4.37 MIL/uL (ref 4.22–5.81)
RDW: 14.4 % (ref 11.5–15.5)
WBC: 17 K/uL — ABNORMAL HIGH (ref 4.0–10.5)
nRBC: 0 % (ref 0.0–0.2)

## 2024-04-03 LAB — MAGNESIUM: Magnesium: 2.3 mg/dL (ref 1.7–2.4)

## 2024-04-03 LAB — TSH: TSH: 4.077 u[IU]/mL (ref 0.350–4.500)

## 2024-04-03 MED ORDER — SODIUM CHLORIDE 0.9 % IV SOLN
200.0000 mg | Freq: Once | INTRAVENOUS | Status: AC
  Administered 2024-04-03: 200 mg via INTRAVENOUS
  Filled 2024-04-03: qty 8

## 2024-04-03 MED ORDER — SODIUM CHLORIDE 0.9 % IV SOLN: Freq: Once | INTRAVENOUS | Status: AC

## 2024-04-03 MED ORDER — HYDROXYZINE HCL 25 MG PO TABS
25.0000 mg | ORAL_TABLET | Freq: Three times a day (TID) | ORAL | 0 refills | Status: AC | PRN
Start: 2024-04-03 — End: ?

## 2024-04-03 NOTE — Assessment & Plan Note (Signed)
 Patient has recurrent pleural effusion in bilateral lungs along with some lung nodules. Cytology negative for malignant cells Getting frequent thoracentesis usually right greater than left, mostly weekly  - Patient is reluctant to get Pleurx catheter placed. - Continue weekly thoracentesis as scheduled.

## 2024-04-03 NOTE — Assessment & Plan Note (Addendum)
 Patient reports chronic cough that is responsive to Robitussin as guaifenesin  Likely secondary to pleural effusions  - Continue guaifenesin  and Robitussin as needed

## 2024-04-03 NOTE — Assessment & Plan Note (Addendum)
 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  Complicated oncology history as below Started on Keytruda  last year with for unexplained pleural effusions and lung nodules. Path unavailable and cytology from pleural effusion did not show any malignant cells  -Patient has no complications from Keytruda .  Tolerating well. - We reviewed the recent CT scan together.  Stable nodule along with stable pleural effusion. - Continue Keytruda  200 mg every 3 weeks. -If patient has no improvement in his pleural effusion or lung lesions, will consider discussing possible biopsy Vs treatment break to assess for response as patient did not have a biopsy proven diagnosis prior to the start of Keytruda  - Labs reviewed today: CMP: Creatinine: Normal, LFTs: Normal, CBC: Leukocytosis present but no anemia or thrombocytopenia.  TSH: Normal - Repeat CT scan in 3 months.  Due 06/2024  Return to clinic in 6 weeks with labs prior to infusion

## 2024-04-03 NOTE — Patient Instructions (Signed)
 [facility] at [facility] Discharge Instructions   You were seen and examined today by Dr. Davonna.  She reviewed the results of your lab work which are normal/stable.   She reviewed the results of your CT scan which is stable.   We will proceed with your treatment today.   Return as scheduled.    Thank you for choosing [facility] at [address] [facility] to provide your oncology and hematology care.  To afford each patient quality time with our provider, please arrive at least 15 minutes before your scheduled appointment time.   If you have a lab appointment with the Cancer Center please come in thru the Main Entrance and check in at the main information desk.  You need to re-schedule your appointment should you arrive 10 or more minutes late.  We strive to give you quality time with our providers, and arriving late affects you and other patients whose appointments are after yours.  Also, if you no show three or more times for appointments you may be dismissed from the clinic at the providers discretion.     Again, thank you for choosing [facility].  Our hope is that these requests will decrease the amount of time that you wait before being seen by our physicians.       _____________________________________________________________  Should you have questions after your visit to [facility], please contact our office at [phone] and follow the prompts.  Our office hours are 8:00 a.m. and 4:30 p.m. Monday - Friday.  Please note that voicemails left after 4:00 p.m. may not be returned until the following business day.  We are closed weekends and major holidays.  You do have access to a nurse 24-7, just call the main number to the clinic [phone] and do not press any options, hold on the line and a nurse will answer the phone.    For prescription refill requests, have your pharmacy contact our office and allow 72 hours.     Due to Covid, you will need to wear a mask upon entering the hospital. If you do not have a mask, a mask will be given to you at the Main Entrance upon arrival. For doctor visits, patients may have 1 support person age 35 or older with them. For treatment visits, patients can not have anyone with them due to social distancing guidelines and our immunocompromised population.

## 2024-04-03 NOTE — Patient Instructions (Signed)
 [facility] [place] - A DEPT OF [facility]  Discharge Instructions: Thank you for choosing [facility] to provide your oncology and hematology care.  If you have a lab appointment with the Cancer Center - please note that after April 8th, 2024, all labs will be drawn in the cancer center.  You do not have to check in or register with the main entrance as you have in the past but will complete your check-in in the cancer center.  Wear comfortable clothing and clothing appropriate for easy access to any Portacath or PICC line.   We strive to give you quality time with your provider. You may need to reschedule your appointment if you arrive late (15 or more minutes).  Arriving late affects you and other patients whose appointments are after yours.  Also, if you miss three or more appointments without notifying the office, you may be dismissed from the clinic at the provider's discretion.      For prescription refill requests, have your pharmacy contact our office and allow 72 hours for refills to be completed.    Today you received the following chemotherapy and/or immunotherapy agents keytruda     To help prevent nausea and vomiting after your treatment, we encourage you to take your nausea medication as directed.  BELOW ARE SYMPTOMS THAT SHOULD BE REPORTED IMMEDIATELY: *FEVER GREATER THAN 100.4 F (38 C) OR HIGHER *CHILLS OR SWEATING *NAUSEA AND VOMITING THAT IS NOT CONTROLLED WITH YOUR NAUSEA MEDICATION *UNUSUAL SHORTNESS OF BREATH *UNUSUAL BRUISING OR BLEEDING *URINARY PROBLEMS (pain or burning when urinating, or frequent urination) *BOWEL PROBLEMS (unusual diarrhea, constipation, pain near the anus) TENDERNESS IN MOUTH AND THROAT WITH OR WITHOUT PRESENCE OF ULCERS (sore throat, sores in mouth, or a toothache) UNUSUAL RASH, SWELLING OR PAIN  UNUSUAL VAGINAL DISCHARGE OR ITCHING   Items with * indicate a potential emergency and should be followed up as  soon as possible or go to the Emergency Department if any problems should occur.  Please show the CHEMOTHERAPY ALERT CARD or IMMUNOTHERAPY ALERT CARD at check-in to the Emergency Department and triage nurse.  Should you have questions after your visit or need to cancel or reschedule your appointment, please contact [facility] [place] - A DEPT OF [facility] [phone]  and follow the prompts.  Office hours are 8:00 a.m. to 4:30 p.m. Monday - Friday. Please note that voicemails left after 4:00 p.m. may not be returned until the following business day.  We are closed weekends and major holidays. You have access to a nurse at all times for urgent questions. Please call the main number to the clinic [phone] and follow the prompts.  For any non-urgent questions, you may also contact your provider using MyChart. We now offer e-Visits for anyone 47 and older to request care online for non-urgent symptoms. For details visit [email or web address].   Also download the MyChart app! Go to the app store, search MyChart, open the app, select  Run, and log in with your MyChart username and password.

## 2024-04-03 NOTE — Assessment & Plan Note (Addendum)
 Patient has a history of immunotherapy induced rash and is on prednisone  10 mg daily  - Continue prednisone  10 mg daily

## 2024-04-03 NOTE — Progress Notes (Signed)
 Patient tolerated therapy with no complaints voiced.  Side effects with management reviewed with understanding verbalized.  Peripheral IV  site clean and dry with no bruising or swelling noted at site.  Good blood return noted before and after administration of therapy.  Band aid applied.  Patient left in satisfactory condition with VSS and no s/s of distress noted.

## 2024-04-04 LAB — T4: T4, Total: 7.6 ug/dL (ref 4.5–12.0)

## 2024-04-06 ENCOUNTER — Ambulatory Visit (HOSPITAL_COMMUNITY)
Admission: RE | Admit: 2024-04-06 | Discharge: 2024-04-06 | Disposition: A | Discharge status: Home / Self Care | Source: Ambulatory Visit | Attending: Hematology | Admitting: Hematology

## 2024-04-06 ENCOUNTER — Ambulatory Visit (HOSPITAL_COMMUNITY)
Admission: RE | Admit: 2024-04-06 | Discharge: 2024-04-06 | Disposition: A | Discharge status: Home / Self Care | Source: Ambulatory Visit | Attending: Physician Assistant | Admitting: Physician Assistant

## 2024-04-06 ENCOUNTER — Encounter (HOSPITAL_COMMUNITY): Payer: Self-pay

## 2024-04-06 DIAGNOSIS — J984 Other disorders of lung: Secondary | ICD-10-CM | POA: Diagnosis not present

## 2024-04-06 DIAGNOSIS — J90 Pleural effusion, not elsewhere classified: Secondary | ICD-10-CM | POA: Diagnosis not present

## 2024-04-06 DIAGNOSIS — Z8551 Personal history of malignant neoplasm of bladder: Secondary | ICD-10-CM | POA: Insufficient documentation

## 2024-04-06 DIAGNOSIS — Z48813 Encounter for surgical aftercare following surgery on the respiratory system: Secondary | ICD-10-CM | POA: Diagnosis not present

## 2024-04-06 MED ORDER — LIDOCAINE HCL (PF) 2 % IJ SOLN
INTRAMUSCULAR | Status: AC
  Filled 2024-04-06: qty 10

## 2024-04-06 MED ORDER — LIDOCAINE HCL (PF) 2 % IJ SOLN
10.0000 mL | Freq: Once | INTRAMUSCULAR | Status: AC
  Administered 2024-04-06: 10 mL

## 2024-04-06 NOTE — Progress Notes (Signed)
 Patient tolerated Right Thoracentesis well today and 1.1 Liters of amber colored fluid removed. Patient verbalized understanding of discharge instructions and transported via wheelchair to xray at this time for post chest xray with no acute distress noted.

## 2024-04-06 NOTE — Procedures (Signed)
 PROCEDURE SUMMARY:  Successful image-guided right thoracentesis. Yielded 1.1 liters of clear, amber fluid. Patient tolerated procedure well. EBL < 1 mL No immediate complications.  Specimen was not sent for labs. Post procedure CXR shows no pneumothorax.  Please see imaging section of Epic for full dictation.  Clotilda DELENA Hesselbach PA-C 04/06/2024 9:27 AM

## 2024-04-09 ENCOUNTER — Inpatient Hospital Stay: Admitting: Dietician

## 2024-04-09 ENCOUNTER — Telehealth: Payer: Self-pay | Admitting: Dietician

## 2024-04-09 NOTE — Telephone Encounter (Signed)
Attempted to reach patient via telephone for nutrition follow-up. Left VM with request for return call. Contact information provided.

## 2024-04-11 ENCOUNTER — Ambulatory Visit: Payer: Medicare Other | Admitting: Urology

## 2024-04-11 ENCOUNTER — Encounter: Payer: Self-pay | Admitting: Urology

## 2024-04-11 VITALS — BP 102/65 | HR 89

## 2024-04-11 DIAGNOSIS — N390 Urinary tract infection, site not specified: Secondary | ICD-10-CM

## 2024-04-11 DIAGNOSIS — Z87440 Personal history of urinary (tract) infections: Secondary | ICD-10-CM | POA: Diagnosis not present

## 2024-04-11 DIAGNOSIS — C679 Malignant neoplasm of bladder, unspecified: Secondary | ICD-10-CM | POA: Diagnosis not present

## 2024-04-11 LAB — URINALYSIS, ROUTINE W REFLEX MICROSCOPIC
Bilirubin, UA: NEGATIVE
Glucose, UA: NEGATIVE
Ketones, UA: NEGATIVE
Nitrite, UA: POSITIVE — AB
Protein,UA: NEGATIVE
RBC, UA: NEGATIVE
Specific Gravity, UA: 1.01 (ref 1.005–1.030)
Urobilinogen, Ur: 0.2 mg/dL (ref 0.2–1.0)
pH, UA: 7 (ref 5.0–7.5)

## 2024-04-11 LAB — MICROSCOPIC EXAMINATION

## 2024-04-11 NOTE — Patient Instructions (Signed)
 Urinary Tract Infection, Male A urinary tract infection (UTI) is an infection in your urinary tract. The urinary tract is made up of the organs that make, store, and get rid of pee (urine) in your body. These organs include: The kidneys. The ureters. The bladder. The urethra. What are the causes? Most UTIs are caused by germs called bacteria. They may be in or near your genitals. These germs grow and cause swelling in your urinary tract. What increases the risk? You're more likely to get a UTI if: You have a soft tube called a catheter that drains your pee. You can't control when you pee or poop. You have trouble peeing because of: A prostate that's bigger than normal. A kidney stone. A urinary blockage. A nerve condition that affects your bladder. Not getting enough to drink. You're sexually active. You're an older adult. You're also more likely to get a UTI if you have other health problems, such as: Diabetes. A weak immune system. Your immune system is your body's defense system. Sickle cell disease. Injury of the spine. What are the signs or symptoms? Symptoms may include: Needing to pee right away. Peeing small amounts often. Trouble getting the stream started. Pain or burning when you pee. Blood in your pee. Pee that smells bad or odd. Pain in your belly or lower back. You may also: Feel confused. This may be the first symptom in older adults. Vomit. Not feel hungry. Feel tired or easily annoyed. Have a fever or chills. How is this diagnosed? A UTI is diagnosed based on your medical history and an exam. You may also have other tests. These may include: Pee tests. Blood tests. Tests for sexually transmitted infections (STIs). If you've had more than one UTI, you may need to have imaging studies done to find out why you keep getting them. How is this treated? A UTI can be treated by: Taking antibiotics or other medicines. Drinking enough fluid to keep your pee  pale yellow. In rare cases, a UTI can cause a very bad condition called sepsis. Sepsis may be treated in the hospital. Follow these instructions at home: Medicines Take your medicines only as told by your health care provider. If you were given antibiotics, take them as told by your provider. Do not stop taking them even if you start to feel better. General instructions Make sure you: Pee often and fully. Do not hold your pee for a long time. Pee after you have sex. Contact a health care provider if: Your symptoms don't get better after 1-2 days of taking antibiotics. Your symptoms go away and then come back. You have a fever or chills. You vomit or feel like you may vomit. Get help right away if: You have very bad pain in your back or lower belly. You faint. This information is not intended to replace advice given to you by your health care provider. Make sure you discuss any questions you have with your health care provider. Document Revised: 07/13/2023 Document Reviewed: 11/05/2022 Elsevier Patient Education  2025 [employer].

## 2024-04-11 NOTE — Progress Notes (Signed)
 04/11/2024 10:39 AM   [name] 04/26/1941 [id number]  Referring provider: Bertell Satterfield, MD [address] [place],  [place] [address]  Followup frequent UTI   HPI: Mr Varma is a 83yo here for followup for frequent UTI.  No UTis since last visit. He remains on hiprex  1g BID. He is having thoracentesis weekly for pleural effusions. CT 8/12 showed stable lung nodules. He is receiving Keytruda . No hematuria.    PMH: Past Medical History:  Diagnosis Date   Bladder cancer (HCC)    Bone cancer (HCC)    BPH (benign prostatic hypertrophy)    Dysuria    GERD (gastroesophageal reflux disease)    History of adenomatous polyp of colon    tubular adenoma 06/ 2018   History of bladder cancer 12/2014   urologist-  dr chauncey--  dx High Grade TCC without stromal invasion s/p TURBT and intravesical BCG tx/  recurrent 07/2015 (pTa) TCC  repear intravesical BCG tx    History of hiatal hernia    History of urethral stricture    Nocturia     Surgical History: Past Surgical History:  Procedure Laterality Date   CARDIAC CATHETERIZATION  1997   normal coronary arteries   CARDIOVASCULAR STRESS TEST  12-14-2005   Low risk perfusion study/  very small scar in the inferior septum from mid ventricle to apex,  no ischemia/  mild inferior septal hypokinesis/  ef 58%   CATARACT EXTRACTION W/ INTRAOCULAR LENS  IMPLANT, BILATERAL  2011   COLONOSCOPY  last one 06/ 2018   CYSTOSCOPY WITH BIOPSY N/A 08/12/2015   Procedure: CYSTOSCOPY WITH BIOPSY AND FULGERATION;  Surgeon: Redell Lynwood chauncey, MD;  Location: [facility];  Service: Urology;  Laterality: N/A;   CYSTOSCOPY WITH INJECTION N/A 06/29/2017   Procedure: CYSTOSCOPY WITH INJECTION OF INDOCYANINE GREEN DYE;  Surgeon: Alvaro Hummer, MD;  Location: [facility];  Service: Urology;  Laterality: N/A;   CYSTOSCOPY WITH URETHRAL DILATATION N/A 03/21/2017   Procedure: CYSTOSCOPY;  Surgeon: chauncey Redell Lynwood, MD;  Location: [facility];  Service: Urology;  Laterality: N/A;   ESOPHAGOGASTRODUODENOSCOPY  12-05-2014   IR IMAGING GUIDED PORT INSERTION  02/07/2018   IR NEPHROSTOMY PLACEMENT LEFT  12/25/2021   IR REMOVAL TUN ACCESS W/ PORT W/O FL MOD SED  04/14/2018   TRANSTHORACIC ECHOCARDIOGRAM  01-31-2008   nomral LVF,  ef 55-65%/  mild pulmonic stenosis without regurg.,  peak transpulomonic valve gradient   TRANSURETHRAL RESECTION OF BLADDER TUMOR N/A 12/31/2014   Procedure: TRANSURETHRAL RESECTION OF BLADDER TUMOR (TURBT);  Surgeon: Oliva Oiler, MD;  Location: [facility];  Service: Urology;  Laterality: N/A;   TRANSURETHRAL RESECTION OF BLADDER TUMOR N/A 03/21/2017   Procedure: POSSIBLE TRANSURETHRAL RESECTION OF BLADDER TUMOR (TURBT);  Surgeon: chauncey Redell Lynwood, MD;  Location: [facility];  Service: Urology;  Laterality: N/A;    Home Medications:  Allergies as of 04/11/2024       Reactions   Ciprofloxacin  Hives   Zofran  [ondansetron ] Rash        Medication List        Accurate as of April 11, 2024 10:39 AM. If you have any questions, ask your nurse or doctor.          STOP taking these medications    megestrol  400 MG/10ML suspension Commonly known as: MEGACE    methenamine  1 g tablet Commonly known as: HIPREX    methylphenidate  10 MG tablet Commonly known as: RITALIN   TAKE these medications    acetaminophen  500 MG tablet Commonly known as: TYLENOL  Take 1,000 mg by mouth every 6 (six) hours as needed for mild pain, fever, moderate pain or headache.   budesonide -formoterol  160-4.5 MCG/ACT inhaler Commonly known as: Symbicort  Inhale 2 puffs into the lungs 2 (two) times daily.   hydrOXYzine  25 MG tablet Commonly known as: ATARAX  Take 1 tablet (25 mg total) by mouth 3 (three) times daily as needed.   levothyroxine  75 MCG tablet Commonly known as: SYNTHROID  Take 75 mcg by mouth daily.   metoprolol  tartrate 25 MG tablet Commonly known  as: LOPRESSOR  Take 1 tablet (25 mg total) by mouth 2 (two) times daily.   pantoprazole  40 MG tablet Commonly known as: PROTONIX  Take 1 tablet (40 mg total) by mouth 2 (two) times daily.   predniSONE  10 MG tablet Commonly known as: DELTASONE  Take 1 tablet (10 mg total) by mouth daily with breakfast.   SSD 1 % cream Generic drug: silver sulfADIAZINE Apply topically 2 (two) times daily.   Vitamin D (Ergocalciferol) 1.25 MG (50000 UNIT) Caps capsule Commonly known as: DRISDOL Take 50,000 Units by mouth once a week.        Allergies:  Allergies  Allergen Reactions   Ciprofloxacin  Hives   Zofran  [Ondansetron ] Rash    Family History: Family History  Problem Relation Age of Onset   Alcoholism Father    Colon cancer Neg Hx    Colon polyps Neg Hx    Diabetes Neg Hx    Kidney disease Neg Hx    Esophageal cancer Neg Hx    Heart disease Neg Hx    Gallbladder disease Neg Hx    Allergic rhinitis Neg Hx    Angioedema Neg Hx    Asthma Neg Hx    Atopy Neg Hx    Eczema Neg Hx    Immunodeficiency Neg Hx    Urticaria Neg Hx     Social History:  reports that he quit smoking about 35 years ago. His smoking use included cigarettes. He started smoking about 69 years ago. He has a 34 pack-year smoking history. He has never used smokeless tobacco. He reports current alcohol use of about 6.0 standard drinks of alcohol per week. He reports that he does not use drugs.  ROS: All other review of systems were reviewed and are negative except what is noted above in HPI  Physical Exam: BP 102/65   Pulse 89   Constitutional:  Alert and oriented, No acute distress. HEENT: Micro AT, moist mucus membranes.  Trachea midline, no masses. Cardiovascular: No clubbing, cyanosis, or edema. Respiratory: Normal respiratory effort, no increased work of breathing. GI: Abdomen is soft, nontender, nondistended, no abdominal masses GU: No CVA tenderness.  Lymph: No cervical or inguinal  lymphadenopathy. Skin: No rashes, bruises or suspicious lesions. Neurologic: Grossly intact, no focal deficits, moving all 4 extremities. Psychiatric: Normal mood and affect.  Laboratory Data: Lab Results  Component Value Date   WBC 17.0 (H) 04/03/2024   HGB 14.8 04/03/2024   HCT 45.6 04/03/2024   MCV 104.3 (H) 04/03/2024   PLT 322 04/03/2024    Lab Results  Component Value Date   CREATININE 1.03 04/03/2024    No results found for: PSA  No results found for: TESTOSTERONE  Lab Results  Component Value Date   HGBA1C 5.1 06/16/2020    Urinalysis    Component Value Date/Time   COLORURINE YELLOW 12/20/2022 1344   APPEARANCEUR HAZY (A) 12/20/2022 1344  APPEARANCEUR Clear 09/17/2022 1023   LABSPEC 1.014 12/20/2022 1344   PHURINE 6.0 12/20/2022 1344   GLUCOSEU NEGATIVE 12/20/2022 1344   HGBUR NEGATIVE 12/20/2022 1344   BILIRUBINUR NEGATIVE 12/20/2022 1344   BILIRUBINUR Negative 09/17/2022 1023   KETONESUR NEGATIVE 12/20/2022 1344   PROTEINUR NEGATIVE 12/20/2022 1344   NITRITE NEGATIVE 12/20/2022 1344   LEUKOCYTESUR NEGATIVE 12/20/2022 1344    Lab Results  Component Value Date   LABMICR See below: 09/17/2022   WBCUA 6-10 (A) 09/17/2022   LABEPIT 0-10 09/17/2022   MUCUS Present 04/23/2022   BACTERIA MANY (A) 12/20/2022    Pertinent Imaging: CT 03/27/2024: Images reviewed and discussed with the patient  No results found for this or any previous visit.  No results found for this or any previous visit.  No results found for this or any previous visit.  No results found for this or any previous visit.  No results found for this or any previous visit.  No results found for this or any previous visit.  No results found for this or any previous visit.  Results for orders placed during the hospital encounter of 05/05/22  CT Renal Stone Study  Narrative CLINICAL DATA:  Flank pain, kidney stone suspected. Pt with blood in urine started this afternoon.  Recent with UTI  EXAM: CT ABDOMEN AND PELVIS WITHOUT CONTRAST  TECHNIQUE: Multidetector CT imaging of the abdomen and pelvis was performed following the standard protocol without IV contrast.  RADIATION DOSE REDUCTION: This exam was performed according to the departmental dose-optimization program which includes automated exposure control, adjustment of the mA and/or kV according to patient size and/or use of iterative reconstruction technique.  COMPARISON:  CT abdomen pelvis 04/11/2022  FINDINGS: Lower chest: Trace to small right and trace left pleural effusions. Small hiatal hernia.  Hepatobiliary: Several fluid density lesions within the right hepatic lobe likely represent simple renal cysts. No pulmonary nodule. No pulmonary mass. No pleural effusion. No pneumothorax.  Pancreas: No focal lesion. Normal pancreatic contour. No surrounding inflammatory changes. No main pancreatic ductal dilatation.  Spleen: Normal in size without focal abnormality.  Adrenals/Urinary Tract:  No adrenal nodule bilaterally.  Persistent mild left hydronephrosis and severe left hydroureter with associated urothelial thickening. No nephroureterolithiasis. No hydronephrosis on the right.  Status post radical cystoprostatectomy with lower quadrant ileal conduit formation.  Stomach/Bowel: Stomach is within normal limits. No evidence of bowel wall thickening or dilatation. Colonic diverticulosis. The appendix is not definitely identified with no inflammatory changes in the right lower quadrant to suggest acute appendicitis.  Vascular/Lymphatic: No abdominal aorta or iliac aneurysm. At least moderate atherosclerotic plaque of the aorta and its branches. No abdominal, pelvic, or inguinal lymphadenopathy.  Reproductive: Prostatectomy.  Other: No intraperitoneal free fluid. No intraperitoneal free gas. No organized fluid collection.  Musculoskeletal:  No abdominal wall hernia or  abnormality.  Similar-appearing expansile lytic lesion of the right inferior pubic rami. No acute displaced fracture.  IMPRESSION: 1. Persistent mild left hydronephrosis and severe left hydroureter with associated urothelial thickening in a patient status post radical cystoprostatectomy with lower quadrant ileal conduit formation. This may reflect chronic changes of obstructive uropathy or reflux. Limited evaluation on this noncontrast study. Recommend correlation with urinalysis for infection. 2. Trace to small right and trace left pleural effusions. 3. Colonic diverticulosis with no acute diverticulitis. 4. Similar-appearing expansile lytic lesion of the right inferior pubic rami. Finding likely represents an osseous metastasis. 5.  Aortic Atherosclerosis (ICD10-I70.0).   Electronically Signed By: Morgane  Naveau M.D.  On: 05/05/2022 23:00   Assessment & Plan:    1. Malignant neoplasm of urinary bladder, unspecified site (HCC) (Primary) -management per medical oncology - Urinalysis, Routine w reflex microscopic  2. Recurrent UTI -hiprex  1g BID  No follow-ups on file.  Belvie Clara, MD  [facility] [place]

## 2024-04-12 ENCOUNTER — Other Ambulatory Visit: Payer: Self-pay

## 2024-04-13 ENCOUNTER — Encounter (HOSPITAL_COMMUNITY): Payer: Self-pay

## 2024-04-13 ENCOUNTER — Ambulatory Visit (HOSPITAL_COMMUNITY)
Admission: RE | Admit: 2024-04-13 | Discharge: 2024-04-13 | Disposition: A | Discharge status: Home / Self Care | Source: Ambulatory Visit | Attending: Oncology | Admitting: Oncology

## 2024-04-13 ENCOUNTER — Ambulatory Visit (HOSPITAL_COMMUNITY)
Admission: RE | Admit: 2024-04-13 | Discharge: 2024-04-13 | Disposition: A | Discharge status: Home / Self Care | Source: Ambulatory Visit | Attending: Student | Admitting: Student

## 2024-04-13 DIAGNOSIS — J90 Pleural effusion, not elsewhere classified: Secondary | ICD-10-CM | POA: Diagnosis not present

## 2024-04-13 DIAGNOSIS — Z48813 Encounter for surgical aftercare following surgery on the respiratory system: Secondary | ICD-10-CM | POA: Diagnosis not present

## 2024-04-13 MED ORDER — LIDOCAINE HCL (PF) 2 % IJ SOLN
10.0000 mL | Freq: Once | INTRAMUSCULAR | Status: AC
  Administered 2024-04-13: 10 mL

## 2024-04-13 NOTE — Procedures (Signed)
 PROCEDURE SUMMARY:  Successful US  guided right thoracentesis. Yielded 1.0 liters of dark yellow fluid. Pt tolerated procedure well. No immediate complications.  Specimen was not sent for labs. CXR ordered.  EBL < 5 mL  Solmon Selmer Ku PA-C 04/13/2024 11:34 AM

## 2024-04-13 NOTE — Progress Notes (Signed)
 Patient tolerated Right sided Thoracentesis procedure well today and 1 Liter of pleural fluid removed. Patient verbalized understanding of discharge instructions and transported via wheelchair to xray at this time for post chest xray with no acute distress noted.

## 2024-04-20 ENCOUNTER — Encounter (HOSPITAL_COMMUNITY): Payer: Self-pay

## 2024-04-20 ENCOUNTER — Ambulatory Visit (HOSPITAL_COMMUNITY)
Admission: RE | Admit: 2024-04-20 | Discharge: 2024-04-20 | Disposition: A | Discharge status: Home / Self Care | Source: Ambulatory Visit | Attending: Physician Assistant | Admitting: Physician Assistant

## 2024-04-20 ENCOUNTER — Ambulatory Visit (HOSPITAL_COMMUNITY)
Admission: RE | Admit: 2024-04-20 | Discharge: 2024-04-20 | Disposition: A | Discharge status: Home / Self Care | Source: Ambulatory Visit | Attending: Oncology | Admitting: Oncology

## 2024-04-20 DIAGNOSIS — R918 Other nonspecific abnormal finding of lung field: Secondary | ICD-10-CM | POA: Diagnosis not present

## 2024-04-20 DIAGNOSIS — Z48813 Encounter for surgical aftercare following surgery on the respiratory system: Secondary | ICD-10-CM | POA: Diagnosis not present

## 2024-04-20 DIAGNOSIS — J90 Pleural effusion, not elsewhere classified: Secondary | ICD-10-CM | POA: Diagnosis not present

## 2024-04-20 MED ORDER — LIDOCAINE HCL (PF) 2 % IJ SOLN
10.0000 mL | Freq: Once | INTRAMUSCULAR | Status: AC
  Administered 2024-04-20: 10 mL

## 2024-04-20 MED ORDER — LIDOCAINE HCL (PF) 2 % IJ SOLN
INTRAMUSCULAR | Status: AC
  Filled 2024-04-20: qty 10

## 2024-04-20 NOTE — Progress Notes (Signed)
 Patient tolerated Right sided Thoracentesis procedure well today and 1 Liters of yellow pleural fluid removed. Patient verbalized understanding of discharge instructions and transported via wheelchair to xray at this time for post chest xray with no acute distress noted.

## 2024-04-20 NOTE — Procedures (Signed)
 PROCEDURE SUMMARY:  Successful image-guided right thoracentesis. Yielded 1.0 liters of clear yellow fluid. Patient tolerated procedure well. EBL < 1 mL No immediate complications.  Specimen was not sent for labs. Post procedure CXR shows no pneumothorax.  Please see imaging section of Epic for full dictation.  Clotilda DELENA Hesselbach PA-C 04/20/2024 11:34 AM

## 2024-04-24 ENCOUNTER — Inpatient Hospital Stay

## 2024-04-24 ENCOUNTER — Encounter: Payer: Self-pay | Admitting: Oncology

## 2024-04-24 ENCOUNTER — Inpatient Hospital Stay: Attending: Oncology

## 2024-04-24 VITALS — BP 107/63 | HR 80 | Temp 97.3°F | Resp 18 | Wt 183.8 lb

## 2024-04-24 DIAGNOSIS — C679 Malignant neoplasm of bladder, unspecified: Secondary | ICD-10-CM

## 2024-04-24 DIAGNOSIS — C7802 Secondary malignant neoplasm of left lung: Secondary | ICD-10-CM | POA: Diagnosis not present

## 2024-04-24 DIAGNOSIS — Z5112 Encounter for antineoplastic immunotherapy: Secondary | ICD-10-CM | POA: Insufficient documentation

## 2024-04-24 DIAGNOSIS — Z23 Encounter for immunization: Secondary | ICD-10-CM | POA: Insufficient documentation

## 2024-04-24 DIAGNOSIS — C7801 Secondary malignant neoplasm of right lung: Secondary | ICD-10-CM | POA: Diagnosis not present

## 2024-04-24 DIAGNOSIS — Z79620 Long term (current) use of immunosuppressive biologic: Secondary | ICD-10-CM | POA: Diagnosis not present

## 2024-04-24 DIAGNOSIS — C7951 Secondary malignant neoplasm of bone: Secondary | ICD-10-CM | POA: Insufficient documentation

## 2024-04-24 LAB — CBC WITH DIFFERENTIAL/PLATELET
Abs Immature Granulocytes: 0.12 K/uL — ABNORMAL HIGH (ref 0.00–0.07)
Basophils Absolute: 0.1 K/uL (ref 0.0–0.1)
Basophils Relative: 1 %
Eosinophils Absolute: 0.2 K/uL (ref 0.0–0.5)
Eosinophils Relative: 2 %
HCT: 45 % (ref 39.0–52.0)
Hemoglobin: 14.5 g/dL (ref 13.0–17.0)
Immature Granulocytes: 1 %
Lymphocytes Relative: 3 %
Lymphs Abs: 0.4 K/uL — ABNORMAL LOW (ref 0.7–4.0)
MCH: 34.3 pg — ABNORMAL HIGH (ref 26.0–34.0)
MCHC: 32.2 g/dL (ref 30.0–36.0)
MCV: 106.4 fL — ABNORMAL HIGH (ref 80.0–100.0)
Monocytes Absolute: 0.6 K/uL (ref 0.1–1.0)
Monocytes Relative: 5 %
Neutro Abs: 11.7 K/uL — ABNORMAL HIGH (ref 1.7–7.7)
Neutrophils Relative %: 88 %
Platelets: 260 K/uL (ref 150–400)
RBC: 4.23 MIL/uL (ref 4.22–5.81)
RDW: 14.6 % (ref 11.5–15.5)
WBC: 13.2 K/uL — ABNORMAL HIGH (ref 4.0–10.5)
nRBC: 0 % (ref 0.0–0.2)

## 2024-04-24 LAB — TSH: TSH: 1.653 u[IU]/mL (ref 0.350–4.500)

## 2024-04-24 LAB — MAGNESIUM: Magnesium: 2.3 mg/dL (ref 1.7–2.4)

## 2024-04-24 LAB — COMPREHENSIVE METABOLIC PANEL WITH GFR
ALT: 12 U/L (ref 0–44)
AST: 15 U/L (ref 15–41)
Albumin: 2.7 g/dL — ABNORMAL LOW (ref 3.5–5.0)
Alkaline Phosphatase: 46 U/L (ref 38–126)
Anion gap: 8 (ref 5–15)
BUN: 25 mg/dL — ABNORMAL HIGH (ref 8–23)
CO2: 28 mmol/L (ref 22–32)
Calcium: 9 mg/dL (ref 8.9–10.3)
Chloride: 102 mmol/L (ref 98–111)
Creatinine, Ser: 1.03 mg/dL (ref 0.61–1.24)
GFR, Estimated: >60 mL/min (ref >60)
Glucose, Bld: 173 mg/dL — ABNORMAL HIGH (ref 70–99)
Potassium: 4.5 mmol/L (ref 3.5–5.1)
Sodium: 138 mmol/L (ref 135–145)
Total Bilirubin: 0.6 mg/dL (ref 0.0–1.2)
Total Protein: 5.7 g/dL — ABNORMAL LOW (ref 6.5–8.1)

## 2024-04-24 MED ORDER — SODIUM CHLORIDE 0.9 % IV SOLN: Freq: Once | INTRAVENOUS | Status: AC

## 2024-04-24 MED ORDER — SODIUM CHLORIDE 0.9 % IV SOLN
200.0000 mg | Freq: Once | INTRAVENOUS | Status: AC
  Administered 2024-04-24: 200 mg via INTRAVENOUS
  Filled 2024-04-24: qty 200

## 2024-04-24 NOTE — Progress Notes (Signed)
 Labs reviewed, ok to treat today per parameters.   Treatment given per orders. Patient tolerated it well without problems. Vitals stable and discharged home from clinic via wheelchair. Follow up as scheduled.

## 2024-04-24 NOTE — Patient Instructions (Signed)
 [facility] [place] - A DEPT OF . [facility]  Discharge Instructions: Thank you for choosing [facility] to provide your oncology and hematology care.  If you have a lab appointment with the Cancer Center - please note that after April 8th, 2024, all labs will be drawn in the cancer center.  You do not have to check in or register with the main entrance as you have in the past but will complete your check-in in the cancer center.  Wear comfortable clothing and clothing appropriate for easy access to any Portacath or PICC line.   We strive to give you quality time with your provider. You may need to reschedule your appointment if you arrive late (15 or more minutes).  Arriving late affects you and other patients whose appointments are after yours.  Also, if you miss three or more appointments without notifying the office, you may be dismissed from the clinic at the provider's discretion.      For prescription refill requests, have your pharmacy contact our office and allow 72 hours for refills to be completed.    Today you received the following chemotherapy and/or immunotherapy agents Pembrolizumab    To help prevent nausea and vomiting after your treatment, we encourage you to take your nausea medication as directed.  BELOW ARE SYMPTOMS THAT SHOULD BE REPORTED IMMEDIATELY: *FEVER GREATER THAN 100.4 F (38 C) OR HIGHER *CHILLS OR SWEATING *NAUSEA AND VOMITING THAT IS NOT CONTROLLED WITH YOUR NAUSEA MEDICATION *UNUSUAL SHORTNESS OF BREATH *UNUSUAL BRUISING OR BLEEDING *URINARY PROBLEMS (pain or burning when urinating, or frequent urination) *BOWEL PROBLEMS (unusual diarrhea, constipation, pain near the anus) TENDERNESS IN MOUTH AND THROAT WITH OR WITHOUT PRESENCE OF ULCERS (sore throat, sores in mouth, or a toothache) UNUSUAL RASH, SWELLING OR PAIN  UNUSUAL VAGINAL DISCHARGE OR ITCHING   Items with * indicate a potential emergency and should be followed  up as soon as possible or go to the Emergency Department if any problems should occur.  Please show the CHEMOTHERAPY ALERT CARD or IMMUNOTHERAPY ALERT CARD at check-in to the Emergency Department and triage nurse.  Should you have questions after your visit or need to cancel or reschedule your appointment, please contact [facility] [place] - A DEPT OF [facility] [phone]  and follow the prompts.  Office hours are 8:00 a.m. to 4:30 p.m. Monday - Friday. Please note that voicemails left after 4:00 p.m. may not be returned until the following business day.  We are closed weekends and major holidays. You have access to a nurse at all times for urgent questions. Please call the main number to the clinic [phone] and follow the prompts.  For any non-urgent questions, you may also contact your provider using MyChart. We now offer e-Visits for anyone 23 and older to request care online for non-urgent symptoms. For details visit [email or web address].   Also download the MyChart app! Go to the app store, search MyChart, open the app, select [place], and log in with your MyChart username and password.

## 2024-04-25 LAB — T4: T4, Total: 7.9 ug/dL (ref 4.5–12.0)

## 2024-04-27 ENCOUNTER — Ambulatory Visit (HOSPITAL_COMMUNITY)
Admission: RE | Admit: 2024-04-27 | Discharge: 2024-04-27 | Disposition: A | Discharge status: Home / Self Care | Source: Ambulatory Visit | Attending: Physician Assistant | Admitting: Physician Assistant

## 2024-04-27 ENCOUNTER — Ambulatory Visit (HOSPITAL_COMMUNITY)
Admission: RE | Admit: 2024-04-27 | Discharge: 2024-04-27 | Disposition: A | Discharge status: Home / Self Care | Source: Ambulatory Visit | Attending: Oncology | Admitting: Oncology

## 2024-04-27 ENCOUNTER — Encounter (HOSPITAL_COMMUNITY): Payer: Self-pay

## 2024-04-27 DIAGNOSIS — R918 Other nonspecific abnormal finding of lung field: Secondary | ICD-10-CM | POA: Diagnosis not present

## 2024-04-27 DIAGNOSIS — J90 Pleural effusion, not elsewhere classified: Secondary | ICD-10-CM | POA: Insufficient documentation

## 2024-04-27 DIAGNOSIS — Z8551 Personal history of malignant neoplasm of bladder: Secondary | ICD-10-CM | POA: Insufficient documentation

## 2024-04-27 DIAGNOSIS — Z48813 Encounter for surgical aftercare following surgery on the respiratory system: Secondary | ICD-10-CM | POA: Diagnosis not present

## 2024-04-27 NOTE — Procedures (Signed)
 PROCEDURE SUMMARY:  Successful image-guided right thoracentesis. Yielded 900 milliliters of clear yellow fluid. Patient tolerated procedure well. EBL < 1 mL No immediate complications.  Specimen was not sent for labs. Post procedure CXR shows no pneumothorax.  Please see imaging section of Epic for full dictation.  Clotilda DELENA Hesselbach PA-C 04/27/2024 11:09 AM

## 2024-04-27 NOTE — Progress Notes (Signed)
 Patient brought to US  Rm1 per WC in no acute distress, on RA. Thoracentesis explained, consent obtained. US  imaging obtained. Local anesthetic admin with no adverse reaction, access obtained at Right post thorax. Clear amber serous fluid retrieved under vacutainer suction. 900cc total removed, tolerated well. Transported to Xray for chest film with DC plan and instructions.

## 2024-05-04 ENCOUNTER — Ambulatory Visit (HOSPITAL_COMMUNITY)
Admission: RE | Admit: 2024-05-04 | Discharge: 2024-05-04 | Disposition: A | Discharge status: Home / Self Care | Source: Ambulatory Visit | Attending: Physician Assistant | Admitting: Physician Assistant

## 2024-05-04 ENCOUNTER — Ambulatory Visit (HOSPITAL_COMMUNITY)
Admission: RE | Admit: 2024-05-04 | Discharge: 2024-05-04 | Disposition: A | Discharge status: Home / Self Care | Source: Ambulatory Visit | Attending: Oncology | Admitting: Oncology

## 2024-05-04 ENCOUNTER — Encounter (HOSPITAL_COMMUNITY): Payer: Self-pay

## 2024-05-04 DIAGNOSIS — J90 Pleural effusion, not elsewhere classified: Secondary | ICD-10-CM | POA: Diagnosis not present

## 2024-05-04 DIAGNOSIS — C679 Malignant neoplasm of bladder, unspecified: Secondary | ICD-10-CM | POA: Diagnosis not present

## 2024-05-04 MED ORDER — LIDOCAINE HCL (PF) 2 % IJ SOLN
10.0000 mL | Freq: Once | INTRAMUSCULAR | Status: AC
  Administered 2024-05-04: 10 mL

## 2024-05-04 MED ORDER — LIDOCAINE HCL (PF) 2 % IJ SOLN
INTRAMUSCULAR | Status: AC
  Filled 2024-05-04: qty 10

## 2024-05-04 NOTE — Procedures (Signed)
 PROCEDURE SUMMARY:  Successful image-guided right thoracentesis. Yielded 800 milliliters of clear yellow fluid. Patient tolerated procedure well. EBL < 1 mL. No immediate complications.  Specimen was not sent for labs. Post procedure CXR shows no pneumothorax.  Please see imaging section of Epic for full dictation.  Clotilda DELENA Hesselbach PA-C 05/04/2024 1:36 PM

## 2024-05-04 NOTE — Progress Notes (Signed)
 Patient tolerated Right Thoracentesis procedure well today and 800 mL of pleural fluid removed. Patient verbalized understanding of discharge instructions and transported via wheelchair to xray at this time for post chest xray with no acute distress noted.

## 2024-05-08 ENCOUNTER — Other Ambulatory Visit: Payer: Self-pay | Admitting: Oncology

## 2024-05-08 DIAGNOSIS — C679 Malignant neoplasm of bladder, unspecified: Secondary | ICD-10-CM

## 2024-05-11 ENCOUNTER — Ambulatory Visit (HOSPITAL_COMMUNITY)
Admission: RE | Admit: 2024-05-11 | Discharge: 2024-05-11 | Disposition: A | Discharge status: Home / Self Care | Source: Ambulatory Visit | Attending: Oncology | Admitting: Oncology

## 2024-05-11 ENCOUNTER — Encounter (HOSPITAL_COMMUNITY): Payer: Self-pay

## 2024-05-11 VITALS — BP 136/64 | HR 84 | Temp 98.1°F | Resp 18

## 2024-05-11 DIAGNOSIS — J90 Pleural effusion, not elsewhere classified: Secondary | ICD-10-CM | POA: Insufficient documentation

## 2024-05-11 DIAGNOSIS — C50919 Malignant neoplasm of unspecified site of unspecified female breast: Secondary | ICD-10-CM | POA: Diagnosis not present

## 2024-05-11 DIAGNOSIS — Z9889 Other specified postprocedural states: Secondary | ICD-10-CM

## 2024-05-11 MED ORDER — LIDOCAINE HCL (PF) 2 % IJ SOLN
INTRAMUSCULAR | Status: AC
  Filled 2024-05-11: qty 10

## 2024-05-11 MED ORDER — LIDOCAINE HCL (PF) 2 % IJ SOLN
10.0000 mL | Freq: Once | INTRAMUSCULAR | Status: AC
Start: 2024-05-11 — End: 2024-05-11
  Administered 2024-05-11: 10 mL via INTRADERMAL

## 2024-05-11 NOTE — Procedures (Signed)
 PROCEDURE SUMMARY:  Successful image-guided right thoracentesis. Yielded 800 milliliters of clear, yellow fluid. Patient tolerated procedure well. EBL < 1 mL No immediate complications.  Specimen was not sent for labs. Post procedure CXR shows no pneumothorax.  Please see imaging section of Epic for full dictation.  Clotilda DELENA Hesselbach PA-C 05/11/2024 9:14 AM

## 2024-05-11 NOTE — Progress Notes (Signed)
 Patient had right thoracentesis performed. Tolerated well. Removed 800cc of pleural fluid. Pt verbalized understanding of discharge instructions. Pt wheeled to xray in no acute distress.

## 2024-05-15 ENCOUNTER — Inpatient Hospital Stay

## 2024-05-15 ENCOUNTER — Inpatient Hospital Stay (HOSPITAL_BASED_OUTPATIENT_CLINIC_OR_DEPARTMENT_OTHER): Admitting: Oncology

## 2024-05-15 VITALS — BP 123/73 | HR 79 | Temp 97.6°F | Resp 18

## 2024-05-15 VITALS — BP 118/79 | HR 86 | Temp 97.7°F | Resp 18 | Wt 185.0 lb

## 2024-05-15 DIAGNOSIS — R21 Rash and other nonspecific skin eruption: Secondary | ICD-10-CM | POA: Diagnosis not present

## 2024-05-15 DIAGNOSIS — J90 Pleural effusion, not elsewhere classified: Secondary | ICD-10-CM

## 2024-05-15 DIAGNOSIS — C7802 Secondary malignant neoplasm of left lung: Secondary | ICD-10-CM | POA: Diagnosis not present

## 2024-05-15 DIAGNOSIS — C679 Malignant neoplasm of bladder, unspecified: Secondary | ICD-10-CM

## 2024-05-15 DIAGNOSIS — C771 Secondary and unspecified malignant neoplasm of intrathoracic lymph nodes: Secondary | ICD-10-CM

## 2024-05-15 DIAGNOSIS — R053 Chronic cough: Secondary | ICD-10-CM

## 2024-05-15 DIAGNOSIS — Z5112 Encounter for antineoplastic immunotherapy: Secondary | ICD-10-CM | POA: Diagnosis not present

## 2024-05-15 DIAGNOSIS — Z23 Encounter for immunization: Secondary | ICD-10-CM | POA: Diagnosis not present

## 2024-05-15 DIAGNOSIS — C7951 Secondary malignant neoplasm of bone: Secondary | ICD-10-CM | POA: Diagnosis not present

## 2024-05-15 DIAGNOSIS — C7801 Secondary malignant neoplasm of right lung: Secondary | ICD-10-CM | POA: Diagnosis not present

## 2024-05-15 LAB — CBC WITH DIFFERENTIAL/PLATELET
Abs Immature Granulocytes: 0.09 K/uL — ABNORMAL HIGH (ref 0.00–0.07)
Basophils Absolute: 0.1 K/uL (ref 0.0–0.1)
Basophils Relative: 1 %
Eosinophils Absolute: 0.7 K/uL — ABNORMAL HIGH (ref 0.0–0.5)
Eosinophils Relative: 7 %
HCT: 45.3 % (ref 39.0–52.0)
Hemoglobin: 14.7 g/dL (ref 13.0–17.0)
Immature Granulocytes: 1 %
Lymphocytes Relative: 6 %
Lymphs Abs: 0.6 K/uL — ABNORMAL LOW (ref 0.7–4.0)
MCH: 34.2 pg — ABNORMAL HIGH (ref 26.0–34.0)
MCHC: 32.5 g/dL (ref 30.0–36.0)
MCV: 105.3 fL — ABNORMAL HIGH (ref 80.0–100.0)
Monocytes Absolute: 1.2 K/uL — ABNORMAL HIGH (ref 0.1–1.0)
Monocytes Relative: 12 %
Neutro Abs: 7.5 K/uL (ref 1.7–7.7)
Neutrophils Relative %: 73 %
Platelets: 262 K/uL (ref 150–400)
RBC: 4.3 MIL/uL (ref 4.22–5.81)
RDW: 13.6 % (ref 11.5–15.5)
WBC: 10.3 K/uL (ref 4.0–10.5)
nRBC: 0 % (ref 0.0–0.2)

## 2024-05-15 LAB — COMPREHENSIVE METABOLIC PANEL WITH GFR
ALT: 10 U/L (ref 0–44)
AST: 15 U/L (ref 15–41)
Albumin: 3.4 g/dL — ABNORMAL LOW (ref 3.5–5.0)
Alkaline Phosphatase: 60 U/L (ref 38–126)
Anion gap: 10 (ref 5–15)
BUN: 25 mg/dL — ABNORMAL HIGH (ref 8–23)
CO2: 26 mmol/L (ref 22–32)
Calcium: 9.6 mg/dL (ref 8.9–10.3)
Chloride: 103 mmol/L (ref 98–111)
Creatinine, Ser: 1.01 mg/dL (ref 0.61–1.24)
GFR, Estimated: >60 mL/min (ref >60)
Glucose, Bld: 103 mg/dL — ABNORMAL HIGH (ref 70–99)
Potassium: 4.2 mmol/L (ref 3.5–5.1)
Sodium: 139 mmol/L (ref 135–145)
Total Bilirubin: 0.4 mg/dL (ref 0.0–1.2)
Total Protein: 5.9 g/dL — ABNORMAL LOW (ref 6.5–8.1)

## 2024-05-15 LAB — MAGNESIUM: Magnesium: 2.4 mg/dL (ref 1.7–2.4)

## 2024-05-15 LAB — TSH: TSH: 3.91 u[IU]/mL (ref 0.350–4.500)

## 2024-05-15 MED ORDER — SODIUM CHLORIDE 0.9 % IV SOLN: Freq: Once | INTRAVENOUS | Status: AC

## 2024-05-15 MED ORDER — INFLUENZA VAC SPLIT HIGH-DOSE 0.5 ML IM SUSY
0.5000 mL | PREFILLED_SYRINGE | INTRAMUSCULAR | Status: AC
  Administered 2024-05-15: 0.5 mL via INTRAMUSCULAR
  Filled 2024-05-15: qty 0.5

## 2024-05-15 MED ORDER — SODIUM CHLORIDE 0.9 % IV SOLN
400.0000 mg | Freq: Once | INTRAVENOUS | Status: AC
  Administered 2024-05-15: 400 mg via INTRAVENOUS
  Filled 2024-05-15: qty 16

## 2024-05-15 NOTE — Assessment & Plan Note (Signed)
 Patient reports chronic cough that is responsive to Robitussin as guaifenesin  Likely secondary to pleural effusions  - Continue guaifenesin  and Robitussin as needed

## 2024-05-15 NOTE — Assessment & Plan Note (Signed)
 Patient has recurrent pleural effusion in bilateral lungs along with some lung nodules. Cytology negative for malignant cells Getting frequent thoracentesis usually right greater than left, mostly weekly  - Patient is reluctant to get Pleurx catheter placed. - Continue weekly thoracentesis as scheduled.

## 2024-05-15 NOTE — Progress Notes (Signed)
 Patient tolerated chemotherapy with no complaints voiced.  Side effects with management reviewed with understanding verbalized.  IV site clean and dry with no bruising or swelling noted at site.  Good blood return noted before and after administration of chemotherapy.  Band aid applied.  Patient left in satisfactory condition with VSS and no s/s of distress noted. All follow ups as scheduled.   [name] [employer]

## 2024-05-15 NOTE — Patient Instructions (Signed)
 [facility] [place] - A DEPT OF [facility]  Discharge Instructions: Thank you for choosing [facility] to provide your oncology and hematology care.  If you have a lab appointment with the Cancer Center - please note that after April 8th, 2024, all labs will be drawn in the cancer center.  You do not have to check in or register with the main entrance as you have in the past but will complete your check-in in the cancer center.  Wear comfortable clothing and clothing appropriate for easy access to any Portacath or PICC line.   We strive to give you quality time with your provider. You may need to reschedule your appointment if you arrive late (15 or more minutes).  Arriving late affects you and other patients whose appointments are after yours.  Also, if you miss three or more appointments without notifying the office, you may be dismissed from the clinic at the provider's discretion.      For prescription refill requests, have your pharmacy contact our office and allow 72 hours for refills to be completed.    Today you received the following chemotherapy and/or immunotherapy agents keytruda     To help prevent nausea and vomiting after your treatment, we encourage you to take your nausea medication as directed.  BELOW ARE SYMPTOMS THAT SHOULD BE REPORTED IMMEDIATELY: *FEVER GREATER THAN 100.4 F (38 C) OR HIGHER *CHILLS OR SWEATING *NAUSEA AND VOMITING THAT IS NOT CONTROLLED WITH YOUR NAUSEA MEDICATION *UNUSUAL SHORTNESS OF BREATH *UNUSUAL BRUISING OR BLEEDING *URINARY PROBLEMS (pain or burning when urinating, or frequent urination) *BOWEL PROBLEMS (unusual diarrhea, constipation, pain near the anus) TENDERNESS IN MOUTH AND THROAT WITH OR WITHOUT PRESENCE OF ULCERS (sore throat, sores in mouth, or a toothache) UNUSUAL RASH, SWELLING OR PAIN  UNUSUAL VAGINAL DISCHARGE OR ITCHING   Items with * indicate a potential emergency and should be followed up as  soon as possible or go to the Emergency Department if any problems should occur.  Please show the CHEMOTHERAPY ALERT CARD or IMMUNOTHERAPY ALERT CARD at check-in to the Emergency Department and triage nurse.  Should you have questions after your visit or need to cancel or reschedule your appointment, please contact [facility] [place] - A DEPT OF [facility] [phone]  and follow the prompts.  Office hours are 8:00 a.m. to 4:30 p.m. Monday - Friday. Please note that voicemails left after 4:00 p.m. may not be returned until the following business day.  We are closed weekends and major holidays. You have access to a nurse at all times for urgent questions. Please call the main number to the clinic [phone] and follow the prompts.  For any non-urgent questions, you may also contact your provider using MyChart. We now offer e-Visits for anyone 47 and older to request care online for non-urgent symptoms. For details visit [email or web address].   Also download the MyChart app! Go to the app store, search MyChart, open the app, select  Run, and log in with your MyChart username and password.

## 2024-05-15 NOTE — Assessment & Plan Note (Addendum)
 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  Complicated oncology history as below Started on Keytruda  last year with for unexplained pleural effusions and lung nodules. Path unavailable and cytology from pleural effusion did not show any malignant cells   -Patient has no complications from Keytruda .  Tolerating well. -Discussed in detail with patient that patient does not have a biopsy-proven diagnosis at this time and that his pleural effusions are not improving.  He does have stable findings on CT scan.  Discussed giving a treatment break to see if patient has progression or consider biopsy.  Patient at this time decided to proceed with treatment until at least next scans. - Will change Keytruda  to 400 mg every 6 weeks. - Labs reviewed today: CMP: Creatinine: Normal, LFTs: Normal, CBC: WBC normal, normal hemoglobin and platelets.  TSH: Normal - Repeat CT scan in 3 months.  Due 06/2024   Return to clinic in 6 weeks with labs prior to infusion

## 2024-05-15 NOTE — Progress Notes (Signed)
 Orders received to modify:  Keytruda  dose to 400 mg q 6 weeks.  Plan updated.  V.O. Dr Ivery Molt, PharmD

## 2024-05-15 NOTE — Assessment & Plan Note (Addendum)
 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

## 2024-05-15 NOTE — Progress Notes (Signed)
 Patient Care Team: Bertell Satterfield, MD as PCP - General (Internal Medicine) Waddell Danelle ORN, MD as PCP - Electrophysiology (Cardiology) Celestia Joesph SQUIBB, RN as Oncology Nurse Navigator (Medical Oncology)  Clinic Day:  05/15/2024  Referring physician: Bertell Satterfield, MD   CHIEF COMPLAINT:  CC: Metastatic urothelial carcinoma of the bladder to the lungs and bones    ASSESSMENT & PLAN:   Assessment & Plan: [name]  is a 83 y.o. male with Metastatic urothelial carcinoma of the bladder to the lungs and bones  Assessment & Plan Malignant neoplasm of urinary bladder, unspecified site [facility]) Metastatic urothelial carcinoma of the bladder to the lungs and bones s/p several lines of treatment.  Complicated oncology history as below Started on Keytruda  last year with for unexplained pleural effusions and lung nodules. Path unavailable and cytology from pleural effusion did not show any malignant cells   -Patient has no complications from Keytruda .  Tolerating well. -Discussed in detail with patient that patient does not have a biopsy-proven diagnosis at this time and that his pleural effusions are not improving.  He does have stable findings on CT scan.  Discussed giving a treatment break to see if patient has progression or consider biopsy.  Patient at this time decided to proceed with treatment until at least next scans. - Will change Keytruda  to 400 mg every 6 weeks. - Labs reviewed today: CMP: Creatinine: Normal, LFTs: Normal, CBC: WBC normal, normal hemoglobin and platelets.  TSH: Normal - Repeat CT scan in 3 months.  Due 06/2024   Return to clinic in 6 weeks with labs prior to infusion Pleural effusion Patient has recurrent pleural effusion in bilateral lungs along with some lung nodules. Cytology negative for malignant cells Getting frequent thoracentesis usually right greater than left, mostly weekly   - Patient is reluctant to get Pleurx catheter placed. - Continue weekly  thoracentesis as scheduled. Rash Patient has a history of immunotherapy induced rash and is on prednisone  10 mg daily   - Continue prednisone  10 mg daily -Continue hydroxyzine  25 mg 3 times a day as needed Chronic cough Patient reports chronic cough that is responsive to Robitussin as guaifenesin  Likely secondary to pleural effusions   - Continue guaifenesin  and Robitussin as needed    The patient understands the plans discussed today and is in agreement with them.  He knows to contact our office if he develops concerns prior to his next appointment.  30 minutes of total time was spent for this patient encounter, including preparation, face-to-face counseling with the patient and coordination of care, physical exam, and documentation of the encounter. > 50% of the time was spent on counseling as documented under my assessment and plan.    LILLETTE Verneta JONELLE Teague,acting as a Neurosurgeon for [name], MD.,have documented all relevant documentation on the behalf of [name], MD,as directed by  [name], MD while in the presence of [name], MD.  I, [name] MD, have reviewed the above documentation for accuracy and completeness, and I agree with the above.     [name], MD   CANCER CENTER [facility] [place] - A DEPT OF [facility] [address] [place] [address] Dept: [phone] Dept Fax: [phone]   No orders of the defined types were placed in this encounter.    ONCOLOGY HISTORY:   I have reviewed his chart and materials related to his cancer extensively and collaborated history with the patient. Summary of  oncologic history is as follows:   Metastatic urothelial carcinoma of the bladder to the lungs and bones:   -11/26/2014: CT entero AP: High suspicion for urothelial carcinoma along the left posterior urinary bladder wall. There continues to be some borderline wall thickening in the jejunum,  query chronic inflammatory process such as celiac disease. Hepatic cysts. Right renal hypodense lesions are probably cysts but technically too small to characterize. -12/31/2014: Cystoscopy with TURBT of two tumors on left lateral wall of bladder, 2 cm and 3 cm in size.  Pathology of transurethral resection: High-grade papillary urothelial carcinoma. No definite areas of invasive disease are identified. Smooth muscle is present for evaluation. TURBT Pathology: High-grade papillary urothelial carcinoma. No invasive disease is identified.  Smooth muscle is present for histologic evaluation. -12/2014-04/2015: Induction intravesical BCG  -08/12/2015: Bladder biopsy. Pathology: Non-invasive high-grade papillary urothelial carcinoma. Muscularis propia is present and not involved.  -08/2015-08/2016: Induction intravesical BCG followed by maintenance BCG -03/21/2017: TURBT. Pathology: Invasive high-grade papillary urothelial carcinoma involving detrusor muscle.  -05/02/2017: Neoadjuvant chemotherapy with gemcitabine  and cisplatin . Just received 1 cycle and did not want further chemotherapy -06/29/2017: Cystoprostatectomy with bilateral lymphadenectomy.  Pathology: Infiltrative high-grade urothelial carcinoma, 2.5 cm tumor size. The carcinoma macroscopically invades perivesical soft tissue (pT3b). Lymphovascular and perineural invasion is identified. Margins of resection negative for carcinoma. 13/13 lymph nodes negative for metastatic carcinoma.  -10/2017: ??Likely MRI showing Right ischium lytic lesion -12/08/2017: Right ischium biopsy. Pathology: Poorly differentiated carcinoma. Comment: PD-L1 [id number] CPS: (90%) -01/16/2018: XRT to the pelvis completed -01/30/2018-05/22/2018: 6 cycles of Carboplatin  and gemcitabine  completed -03/31/2018: CT CAP: Redemonstration of bilateral pulmonary metastasis. The majority of nodules are similar in size. One index nodule has minimally decreased. Progression of right inferior  pubic ramus osseous metastasis. No new sites of soft tissue metastasis identified. -06/19/2018: MRI hip: Large expansile and destructive centrally necrotic mass of the right inferior pubic ramus, with pathologic fracture along its anterior margin, and with notable soft tissue extension. -06/21/2018-12/13/2018: Pembrolizumab  every 3 weeks, discontinued due to skin toxicity -01/24/2019: CT CAP: The dominant left upper lobe pulmonary nodule is stable but the other smaller pulmonary nodules demonstrate reduced size/conspicuity. Essentially stable lytic mass of the right inferior pubic ramus. -03/05/2020: CT CAP: Enlarging right upper lobe pulmonary nodule compatible with enlarging neoplasm. Whether this represents a metastatic lesion or a primary bronchogenic neoplasm is uncertain. There is also an enlarged right paratracheal lymph node measuring 1.9 cm in short axis, new compared to the prior study, concerning for nodal metastatic disease. Large lytic lesion in the right inferior pubic ramus, similar to prior examinations, compatible with a metastatic lesion. -04/23/2020: 50 Gray in 10 fractions XRT completed to right paratracheal lymph node -10/22/2021: PET:  Hypermetabolic subcarinal, AP window, and bilateral hilar adenopathy. The 1.4 cm subcarinal lymph node is particularly hypermetabolic maximum SUV 21.1. Low-grade activity in the right apical density, maximum SUV 3.1, probably representing sequela of prior radiation therapy. 9 mm anterior left upper lobe nodule has a maximum SUV of 1.6. The large lytic lesion of the right pubic bone and ischium is relatively photopenic, favoring treated malignancy. -11/16/2021-11/27/2021: SBRT to PET positive intrathoracic lymph nodes -03/15/2022: PET: Overall decrease in hypermetabolism involving mediastinal and hilar lymph nodes, compatible with response to therapy. Probable post treatment scarring in the apical segment right upper lobe. 10 mm nodular lesion in the anterior  left upper lobe. Focal anorectal hypermetabolism. Expansile lytic lesion and pathologic fracture involving the right inferior pubic ramus, without abnormal hypermetabolism, findings possibly representing  treated metastatic disease. -11/25/2022: PET: No evidence of local recurrence of bladder cancer or metastatic disease. A previously demonstrated mildly hypermetabolic subcarinal lymph node has decreased in metabolic activity. New patchy ground-glass opacities medially in both upper lobes with associated low level metabolic activity, likely inflammatory/infectious. Enlarging bilateral pleural effusions with increased dependent atelectasis in both lungs. Stable treated lytic lesion in the right inferior pubic ramus and ischium. -12/20/2022: CT angio chest: Improved right pleural effusion with a small residual status post thoracentesis. No pneumothorax. Persistent left-sided pleural effusion. Multifocal areas of parenchymal opacity are again noted. Separate spiculated 11 mm left upper lobe lung nodule. -12/20/2022: Right thoracentesis with 1.8 L of fluid removed -12/21/2022: Left thoracentesis with 700 mL of fluid removed. Cytology was negative for carcinoma.  (No path to prove urothelial carcinoma) -01/07/2023-current: Keytruda  200 mg every 3 weeks  -05/15/2024: Change Keytruda  to 400 mg every 6 weeks -03/27/2024: CT CAP: Post treatment collapse/consolidation in the posterior segment right upper lobe with a rounded masslike area of low attenuation posteromedially. Difficult to exclude residual disease. Stable anterior segment left upper lobe pulmonary nodule. Stable osseous metastatic disease. Cysto prostatectomy with a right lower quadrant ileal conduit. Large loculated right pleural effusion, stable, with rounded atelectasis in the posterior right lower lobe. Small to moderate left pleural effusion, decreased.  Current Treatment:  Keytruda  400 mg every 6 weeks  INTERVAL HISTORY:   KACEY VICUNA is here  today for follow up. Patient is accompanied by his wife.   He is requiring weekly thoracentesis for pleural effusions. We discussed the possibility of discontinuing Keytruda  as bilateral pleural effusions have not significantly improved since starting Keytruda  nor is there a pathology confirming the diagnosis of carcinoma. Joell's wife reports his condition has improved since starting Keytruda  in terms of strength and that the amount of fluid pulled per thoracentesis session has slightly decreased from 900 mL to 800 mL. I have recommended that Olajuwon get a Pleurx catheter, but he is hesitant about getting another bag. We will continue Keytruda  at this visit, though treatment will now occur every 6 weeks  The patient's wife states he eats a large amount of ice cream daily and is concerned about his blood sugar. He is drinking 3 Boosts a day. [place] is taking synthroid , prednisone , and hydroxyzine  as prescribed.    I have reviewed the past medical history, past surgical history, social history and family history with the patient and they are unchanged from previous note.  ALLERGIES:  is allergic to ciprofloxacin  and zofran  [ondansetron ].  MEDICATIONS:  Current Outpatient Medications  Medication Sig Dispense Refill   acetaminophen  (TYLENOL ) 500 MG tablet Take 1,000 mg by mouth every 6 (six) hours as needed for mild pain, fever, moderate pain or headache.     budesonide -formoterol  (SYMBICORT ) 160-4.5 MCG/ACT inhaler Inhale 2 puffs into the lungs 2 (two) times daily. 1 each 6   hydrOXYzine  (ATARAX ) 25 MG tablet Take 1 tablet (25 mg total) by mouth 3 (three) times daily as needed. 90 tablet 0   levothyroxine  (SYNTHROID ) 50 MCG tablet Take 50 mcg by mouth daily.     metoprolol  tartrate (LOPRESSOR ) 25 MG tablet Take 1 tablet (25 mg total) by mouth 2 (two) times daily. 60 tablet 2   pantoprazole  (PROTONIX ) 40 MG tablet Take 1 tablet (40 mg total) by mouth 2 (two) times daily. 60 tablet 0   predniSONE   (DELTASONE ) 10 MG tablet Take 1 tablet (10 mg total) by mouth daily with breakfast. (Patient taking differently: Take 10  mg by mouth daily with breakfast. Takes every other day) 90 tablet 3   Vitamin D, Ergocalciferol, (DRISDOL) 1.25 MG (50000 UNIT) CAPS capsule Take 50,000 Units by mouth once a week.     SSD 1 % cream Apply topically 2 (two) times daily. (Patient not taking: Reported on 05/15/2024)     Current Facility-Administered Medications  Medication Dose Route Frequency Provider Last Rate Last Admin   omalizumab  (XOLAIR ) prefilled syringe 300 mg  300 mg Subcutaneous Q14 Days Iva Marty Saltness, MD   300 mg at 11/24/22 9143    REVIEW OF SYSTEMS:   Constitutional: Denies fevers, chills or abnormal weight loss Eyes: Denies blurriness of vision Ears, nose, mouth, throat, and face: Denies mucositis or sore throat Respiratory: Denies cough, dyspnea or wheezes Cardiovascular: Denies palpitation, chest discomfort or lower extremity swelling Gastrointestinal:  Denies nausea, heartburn or change in bowel habits Skin: Denies abnormal skin rashes Lymphatics: Denies new lymphadenopathy or easy bruising Neurological:Denies numbness, tingling or new weaknesses Behavioral/Psych: Mood is stable, no new changes  All other systems were reviewed with the patient and are negative.   VITALS:  Blood pressure 118/79, pulse 86, temperature 97.7 F (36.5 C), temperature source Tympanic, resp. rate 18, weight 185 lb (83.9 kg), SpO2 96%.  Wt Readings from Last 3 Encounters:  05/15/24 185 lb (83.9 kg)  04/24/24 183 lb 12.8 oz (83.4 kg)  04/03/24 179 lb (81.2 kg)    Body mass index is 25.44 kg/m.  Performance status (ECOG): 2 - Symptomatic, <50% confined to bed  PHYSICAL EXAM:   GENERAL:alert, no distress and comfortable SKIN:Erythematous rash- multiple papular, tiny in the inner side of his right upper arm. LYMPH:  no palpable lymphadenopathy in the cervical, axillary or inguinal LUNGS: clear to  auscultation and percussion with normal breathing effort HEART: regular rate & rhythm and no murmurs and no lower extremity edema ABDOMEN:abdomen soft, non-tender and normal bowel sounds Musculoskeletal:no cyanosis of digits and no clubbing  NEURO: alert & oriented x 3 with fluent speech, no focal motor/sensory deficits  LABORATORY DATA:  I have reviewed the data as listed    Component Value Date/Time   NA 139 05/15/2024 0938   NA 140 04/30/2019 1033   NA 141 05/23/2017 0828   K 4.2 05/15/2024 0938   K 4.0 05/23/2017 0828   CL 103 05/15/2024 0938   CO2 26 05/15/2024 0938   CO2 27 05/23/2017 0828   GLUCOSE 103 (H) 05/15/2024 0938   GLUCOSE 97 05/23/2017 0828   BUN 25 (H) 05/15/2024 0938   BUN 16 04/30/2019 1033   BUN 12.5 05/23/2017 0828   CREATININE 1.01 05/15/2024 0938   CREATININE 1.07 07/02/2022 0935   CREATININE 0.8 05/23/2017 0828   CALCIUM 9.6 05/15/2024 0938   CALCIUM 9.0 05/23/2017 0828   PROT 5.9 (L) 05/15/2024 0938   PROT 5.7 (L) 04/30/2019 1033   PROT 5.6 (L) 05/23/2017 0828   ALBUMIN 3.4 (L) 05/15/2024 0938   ALBUMIN 3.7 04/30/2019 1033   ALBUMIN 2.5 (L) 05/23/2017 0828   AST 15 05/15/2024 0938   AST 13 (L) 07/02/2022 0935   AST 13 05/23/2017 0828   ALT 10 05/15/2024 0938   ALT 15 07/02/2022 0935   ALT 15 05/23/2017 0828   ALKPHOS 60 05/15/2024 0938   ALKPHOS 57 05/23/2017 0828   BILITOT 0.4 05/15/2024 0938   BILITOT 0.5 07/02/2022 0935   BILITOT 0.37 05/23/2017 0828   GFRNONAA >60 05/15/2024 0938   GFRNONAA >60 07/02/2022 0935   GFRAA >  60 03/05/2020 1407     Lab Results  Component Value Date   WBC 10.3 05/15/2024   NEUTROABS 7.5 05/15/2024   HGB 14.7 05/15/2024   HCT 45.3 05/15/2024   MCV 105.3 (H) 05/15/2024   PLT 262 05/15/2024     Latest Reference Range & Units 05/15/24 09:38  TSH 0.350 - 4.500 uIU/mL 3.910    RADIOGRAPHIC STUDIES: I have personally reviewed the radiological images as listed and agreed with the findings in the  report.  US  THORACENTESIS ASP PLEURAL SPACE W/IMG GUIDE INDICATION: Patient with history of metastatic bladder cancer, recurrent right pleural effusion. Request for therapeutic right thoracentesis.  EXAM: ULTRASOUND GUIDED RIGHT THORACENTESIS  MEDICATIONS: 5 mL 1% lidocaine   COMPLICATIONS: None immediate.  PROCEDURE: An ultrasound guided thoracentesis was thoroughly discussed with the patient and questions answered. The benefits, risks, alternatives and complications were also discussed. The patient understands and wishes to proceed with the procedure. Written consent was obtained.  Ultrasound was performed to localize and mark an adequate pocket of fluid in the right chest. The area was then prepped and draped in the normal sterile fashion. 1% Lidocaine  was used for local anesthesia. Under ultrasound guidance a 6 Fr Safe-T-Centesis catheter was introduced. Thoracentesis was performed. The catheter was removed and a dressing applied.  FINDINGS: A total of approximately 800 mL of clear yellow fluid was removed.  IMPRESSION: Successful ultrasound guided right thoracentesis yielding 800 mL of pleural fluid.  Performed by Clotilda Hesselbach, PA-C  Electronically Signed   By: Ester Sides M.D.   On: 05/11/2024 13:27 DG Chest Portable 1 View CLINICAL DATA:  post thora right  EXAM: DG CHEST 1V PORT  COMPARISON:  05/04/2024, 03/27/2024, on  FINDINGS: No new airspace consolidation or pneumothorax. Similar appearance of the moderate volume, multiloculated right pleural effusion. Unchanged small left pleural effusion. Rightward shift of the cardiomediastinal structures, due to volume loss on the right. No cardiomegaly. Tortuous aorta with aortic atherosclerosis. No acute fracture or destructive lesions. Multilevel thoracic osteophytosis.  IMPRESSION: Redemonstration of the bilateral pleural effusions, multiloculated on the right, but similar in size. No  pneumothorax.  Electronically Signed   By: Rogelia Myers M.D.   On: 05/11/2024 09:36

## 2024-05-16 LAB — T4: T4, Total: 7.6 ug/dL (ref 4.5–12.0)

## 2024-05-18 ENCOUNTER — Ambulatory Visit (HOSPITAL_COMMUNITY)
Admission: RE | Admit: 2024-05-18 | Discharge: 2024-05-18 | Disposition: A | Discharge status: Home / Self Care | Source: Ambulatory Visit | Attending: Oncology | Admitting: Oncology

## 2024-05-18 ENCOUNTER — Encounter (HOSPITAL_COMMUNITY): Payer: Self-pay

## 2024-05-18 ENCOUNTER — Ambulatory Visit (HOSPITAL_COMMUNITY)
Admission: RE | Admit: 2024-05-18 | Discharge: 2024-05-18 | Disposition: A | Discharge status: Home / Self Care | Source: Ambulatory Visit | Attending: Student | Admitting: Student

## 2024-05-18 DIAGNOSIS — J90 Pleural effusion, not elsewhere classified: Secondary | ICD-10-CM | POA: Diagnosis not present

## 2024-05-18 MED ORDER — LIDOCAINE HCL (PF) 2 % IJ SOLN
10.0000 mL | Freq: Once | INTRAMUSCULAR | Status: AC
Start: 2024-05-18 — End: 2024-05-18
  Administered 2024-05-18: 10 mL

## 2024-05-18 MED ORDER — LIDOCAINE HCL (PF) 2 % IJ SOLN
INTRAMUSCULAR | Status: AC
  Filled 2024-05-18: qty 10

## 2024-05-18 NOTE — Progress Notes (Signed)
 Patient tolerated Right Thoracentesis procedure well today and 850 mL of yellow pleural fluid removed. Patient verbalized understanding of discharge instructions and transported via wheelchair to xray at this time for post chest xray with no acute distress noted.

## 2024-05-18 NOTE — Procedures (Signed)
 PROCEDURE SUMMARY:  Successful US  guided right thoracentesis. Yielded 850 mL of yellow, clear fluid. Pt tolerated procedure well. No immediate complications.  Specimen was not sent for labs. CXR ordered.  EBL < 5 mL  Solmon Selmer Ku PA-C 05/18/2024 1:11 PM

## 2024-05-25 ENCOUNTER — Ambulatory Visit (HOSPITAL_COMMUNITY)
Admission: RE | Admit: 2024-05-25 | Discharge: 2024-05-25 | Disposition: A | Discharge status: Home / Self Care | Source: Ambulatory Visit | Attending: Physician Assistant | Admitting: Physician Assistant

## 2024-05-25 ENCOUNTER — Encounter (HOSPITAL_COMMUNITY): Payer: Self-pay

## 2024-05-25 ENCOUNTER — Ambulatory Visit (HOSPITAL_COMMUNITY)
Admission: RE | Admit: 2024-05-25 | Discharge: 2024-05-25 | Disposition: A | Discharge status: Home / Self Care | Source: Ambulatory Visit | Attending: Oncology | Admitting: Oncology

## 2024-05-25 DIAGNOSIS — Z8551 Personal history of malignant neoplasm of bladder: Secondary | ICD-10-CM | POA: Diagnosis not present

## 2024-05-25 DIAGNOSIS — J90 Pleural effusion, not elsewhere classified: Secondary | ICD-10-CM | POA: Insufficient documentation

## 2024-05-25 MED ORDER — LIDOCAINE HCL (PF) 2 % IJ SOLN
10.0000 mL | Freq: Once | INTRAMUSCULAR | Status: AC
Start: 2024-05-25 — End: 2024-05-25
  Administered 2024-05-25: 10 mL

## 2024-05-25 MED ORDER — LIDOCAINE HCL (PF) 2 % IJ SOLN
INTRAMUSCULAR | Status: AC
  Filled 2024-05-25: qty 10

## 2024-05-25 NOTE — Progress Notes (Signed)
 Patient tolerated Right Thoracentesis procedure well today and 900 mL of pleural fluid removed. Patient verbalized understanding of discharge instructions and transported via wheelchair to xray at this time for post chest xray with no acute distress noted.

## 2024-05-25 NOTE — Procedures (Signed)
 PROCEDURE SUMMARY:  Successful image-guided right thoracentesis. Yielded 900 milliliters of clear yellow fluid. Patient tolerated procedure well. EBL < 1 mL. No immediate complications.  Specimen was not sent for labs. Post procedure CXR shows no pneumothorax.  Please see imaging section of Epic for full dictation.  Clotilda DELENA Hesselbach PA-C 05/25/2024 10:14 AM

## 2024-06-01 ENCOUNTER — Other Ambulatory Visit (HOSPITAL_COMMUNITY): Payer: Self-pay | Admitting: Oncology

## 2024-06-01 ENCOUNTER — Ambulatory Visit (HOSPITAL_COMMUNITY)
Admission: RE | Admit: 2024-06-01 | Discharge: 2024-06-01 | Disposition: A | Source: Ambulatory Visit | Attending: Oncology

## 2024-06-01 ENCOUNTER — Encounter (HOSPITAL_COMMUNITY): Payer: Self-pay

## 2024-06-01 ENCOUNTER — Ambulatory Visit (HOSPITAL_COMMUNITY)
Admission: RE | Admit: 2024-06-01 | Discharge: 2024-06-01 | Disposition: A | Source: Ambulatory Visit | Attending: Physician Assistant

## 2024-06-01 DIAGNOSIS — Z9889 Other specified postprocedural states: Secondary | ICD-10-CM | POA: Insufficient documentation

## 2024-06-01 DIAGNOSIS — J948 Other specified pleural conditions: Secondary | ICD-10-CM | POA: Insufficient documentation

## 2024-06-01 DIAGNOSIS — R9389 Abnormal findings on diagnostic imaging of other specified body structures: Secondary | ICD-10-CM | POA: Diagnosis not present

## 2024-06-01 DIAGNOSIS — J90 Pleural effusion, not elsewhere classified: Secondary | ICD-10-CM

## 2024-06-01 DIAGNOSIS — Z8551 Personal history of malignant neoplasm of bladder: Secondary | ICD-10-CM | POA: Diagnosis not present

## 2024-06-01 MED ORDER — LIDOCAINE HCL (PF) 2 % IJ SOLN
10.0000 mL | Freq: Once | INTRAMUSCULAR | Status: AC
  Administered 2024-06-01: 10 mL via INTRADERMAL

## 2024-06-01 MED ORDER — LIDOCAINE HCL (PF) 2 % IJ SOLN
INTRAMUSCULAR | Status: AC
  Filled 2024-06-01: qty 10

## 2024-06-01 NOTE — Progress Notes (Signed)
 Patient brought ot US  Rm 1 in no obvious dsitress. Thoracentesis explained, consent obtained. Prepped and draped in sterile fashion. US  imaging obtained. Local anesthetic admin without adverse reaction. Access obtained at R post thorax. With no difficulty. 700cc total retreived. Access removed without complication, bandage in place. No bleeding or hematoma present. Dc'd to care of family.

## 2024-06-01 NOTE — Procedures (Signed)
 PROCEDURE SUMMARY:  Successful image-guided right thoracentesis. Yielded 700 milliliters of clear yellow fluid. Patient tolerated procedure well. EBL < 1 mL No immediate complications.  Specimen was not sent for labs. Post procedure CXR shows no pneumothorax.  Please see imaging section of Epic for full dictation.  Clotilda DELENA Hesselbach PA-C 06/01/2024 11:01 AM

## 2024-06-05 ENCOUNTER — Inpatient Hospital Stay

## 2024-06-08 ENCOUNTER — Encounter (HOSPITAL_COMMUNITY): Payer: Self-pay

## 2024-06-08 ENCOUNTER — Ambulatory Visit (HOSPITAL_COMMUNITY)
Admission: RE | Admit: 2024-06-08 | Discharge: 2024-06-08 | Disposition: A | Discharge status: Home / Self Care | Source: Ambulatory Visit | Attending: Oncology | Admitting: Oncology

## 2024-06-08 ENCOUNTER — Ambulatory Visit (HOSPITAL_COMMUNITY)
Admission: RE | Admit: 2024-06-08 | Discharge: 2024-06-08 | Disposition: A | Discharge status: Home / Self Care | Source: Ambulatory Visit | Attending: Physician Assistant | Admitting: Physician Assistant

## 2024-06-08 DIAGNOSIS — J90 Pleural effusion, not elsewhere classified: Secondary | ICD-10-CM | POA: Diagnosis not present

## 2024-06-08 MED ORDER — LIDOCAINE HCL (PF) 2 % IJ SOLN
INTRAMUSCULAR | Status: AC
  Filled 2024-06-08: qty 10

## 2024-06-08 MED ORDER — LIDOCAINE HCL (PF) 2 % IJ SOLN
10.0000 mL | Freq: Once | INTRAMUSCULAR | Status: AC
  Administered 2024-06-08: 10 mL

## 2024-06-08 NOTE — Progress Notes (Signed)
 Patient tolerated Right Thoracentesis procedure well today and 800 mL of clear yellow pleural fluid removed. Patient verbalized understanding of discharge instructions and transported via wheelchair to xray for post chest xray with no acute distress noted.

## 2024-06-08 NOTE — Procedures (Signed)
 PROCEDURE SUMMARY:  Successful image-guided right thoracentesis. Yielded 800 milliliters of clear yellow fluid. Patient tolerated procedure well. EBL < 1 mL. No immediate complications.  Specimen was not sent for labs. Post procedure CXR shows no pneumothorax.  Please see imaging section of Epic for full dictation.  Clotilda DELENA Hesselbach PA-C 06/08/2024 10:20 AM

## 2024-06-15 ENCOUNTER — Ambulatory Visit (HOSPITAL_COMMUNITY)
Admission: RE | Admit: 2024-06-15 | Discharge: 2024-06-15 | Disposition: A | Discharge status: Home / Self Care | Source: Ambulatory Visit | Attending: Oncology | Admitting: Oncology

## 2024-06-15 ENCOUNTER — Encounter (HOSPITAL_COMMUNITY): Payer: Self-pay

## 2024-06-15 DIAGNOSIS — J90 Pleural effusion, not elsewhere classified: Secondary | ICD-10-CM | POA: Insufficient documentation

## 2024-06-15 DIAGNOSIS — Z8551 Personal history of malignant neoplasm of bladder: Secondary | ICD-10-CM | POA: Diagnosis not present

## 2024-06-15 MED ORDER — LIDOCAINE HCL (PF) 2 % IJ SOLN
10.0000 mL | Freq: Once | INTRAMUSCULAR | Status: AC
  Administered 2024-06-15: 10 mL via INTRADERMAL

## 2024-06-15 NOTE — Procedures (Signed)
 EXAM: ULTRASOUND GUIDED RIGHT THORACENTESIS 06/15/2024 10:44:13 AM  TECHNIQUE: Informed consent was obtained after a detailed explanation of the procedure including risks, benefits, and alternatives. Universal protocol was performed. Appropriate location determined under ultrasound.  The chest was prepped and draped in sterile fashion and local anesthesia was achieved with lidocaine . A thoracentesis catheter was advanced under ultrasound guidance into right pleural effusion and thoracentesis was performed yielding 850 ml of straw-colored fluid . The patient tolerated the procedure well.   COMPARISON: None provided.  CLINICAL HISTORY: Pleural effusion.  FINDINGS: A total of [850 cc] was removed.  IMPRESSION: 1. Successful ultrasound guided thoracentesis.

## 2024-06-15 NOTE — Progress Notes (Signed)
 Pt completed right US  guided thoracentesis. Tolerated well. VSS. Removed 850cc yellow pleural fluid. Pt wheeled to xray for post xray with no acute distress noted.

## 2024-06-22 ENCOUNTER — Ambulatory Visit (HOSPITAL_COMMUNITY)
Admission: RE | Admit: 2024-06-22 | Discharge: 2024-06-22 | Disposition: A | Discharge status: Home / Self Care | Source: Ambulatory Visit | Attending: Physician Assistant | Admitting: Physician Assistant

## 2024-06-22 ENCOUNTER — Ambulatory Visit (HOSPITAL_COMMUNITY)
Admission: RE | Admit: 2024-06-22 | Discharge: 2024-06-22 | Disposition: A | Discharge status: Home / Self Care | Source: Ambulatory Visit | Attending: Oncology | Admitting: Oncology

## 2024-06-22 DIAGNOSIS — Z8551 Personal history of malignant neoplasm of bladder: Secondary | ICD-10-CM | POA: Insufficient documentation

## 2024-06-22 DIAGNOSIS — J90 Pleural effusion, not elsewhere classified: Secondary | ICD-10-CM | POA: Diagnosis present

## 2024-06-22 MED ORDER — LIDOCAINE HCL (PF) 2 % IJ SOLN
10.0000 mL | Freq: Once | INTRAMUSCULAR | Status: AC
  Administered 2024-06-22: 10 mL via INTRADERMAL

## 2024-06-22 MED ORDER — LIDOCAINE HCL (PF) 2 % IJ SOLN
INTRAMUSCULAR | Status: AC
  Filled 2024-06-22: qty 10

## 2024-06-22 NOTE — Procedures (Signed)
 PROCEDURE SUMMARY:  Successful image-guided right thoracentesis. Yielded 600 mL of clear yellow fluid. Patient tolerated procedure well. EBL < 1 mL No immediate complications.  Specimen was sent for labs. Post procedure CXR pending.  Please see imaging section of Epic for full dictation.  Clotilda DELENA Hesselbach PA-C 06/22/2024 1:48 PM

## 2024-06-22 NOTE — Progress Notes (Signed)
 Patients brought to US  RM 1 per WC in no obvious distress. Thoracentesis explained. Consent obtained. Prepped and draped in sterile manner. US  imaging obtained. Local anesthetic admin without adverse event. Access obtained at post thorax, clear amber serous fluid retrieved under vacutainer suction. 600cc total removed without complication. Access removed without difficulty, intact. Bandage placed. No bleeding or hematoma evident.

## 2024-06-26 NOTE — Progress Notes (Signed)
 " Patient Care Team: Bertell Satterfield, MD as PCP - General (Internal Medicine) Waddell Danelle ORN, MD as PCP - Electrophysiology (Cardiology) Celestia Joesph SQUIBB, RN as Oncology Nurse Navigator (Medical Oncology)  Clinic Day:  06/26/2024  Referring physician: Bertell Satterfield, MD   CHIEF COMPLAINT:  CC: Metastatic urothelial carcinoma of the bladder to the lungs and bones    ASSESSMENT & PLAN:   Assessment & Plan: [name]  is a 83 y.o. male with Metastatic urothelial carcinoma of the bladder to the lungs and bones  Malignant neoplasm of urinary bladder Metastatic urothelial carcinoma of the bladder to the lungs and bones s/p several lines of treatment.  Complicated oncology history as below Started on Keytruda  last year with for unexplained pleural effusions and lung nodules. Path unavailable and cytology from pleural effusion did not show any malignant cells   -Patient has no complications from Keytruda .  Tolerating well. -Re-discussed in detail with patient that patient does not have a biopsy-proven diagnosis at this time and that his pleural effusions are not improving.  He does have stable findings on CT scan.  Discussed giving a treatment break to see if patient has progression or consider biopsy.  Patient at this time decided to proceed with treatment until at least next scans. - Labs reviewed today: CMP: Creatinine: Normal, Magnesium  :2.5, LFTs: Normal, CBC: WBC: 11.8, Hb:1.0, PLT:250.  TSH: 2.440 -Physical exam stable today. Proceed with treatment today.  - Continue Keytruda  to 400 mg every 6 weeks. - Repeat CT scan in 3 months.  Will order prior to next visit.    Return to clinic in 6 weeks with labs prior to infusion  Pleural effusion Patient has recurrent pleural effusion in bilateral lungs along with some lung nodules. Cytology negative for malignant cells Getting frequent thoracentesis usually right greater than left, mostly weekly. Thoracentesis volumes have gone down  significantly.    - Patient is reluctant to get Pleurx catheter placed. - Continue weekly thoracentesis as scheduled.  Rash Patient has a history of immunotherapy induced rash and is on prednisone  10 mg daily   - Continue prednisone  10 mg daily -Continue hydroxyzine  25 mg 3 times a day as needed  Chronic cough Patient reports chronic cough that is responsive to Robitussin and guaifenesin  Likely secondary to pleural effusions   - Continue guaifenesin  and Robitussin as needed  The patient understands the plans discussed today and is in agreement with them.  He knows to contact our office if he develops concerns prior to his next appointment.  The total time spent in the appointment was 23 minutes for the encounter with patient, including review of chart and various tests results, discussions about plan of care and coordination of care plan    [name], MD  [facility] [place] - A DEPT OF [facility] [address] [place] [address] Dept: [phone] Dept Fax: [phone]   No orders of the defined types were placed in this encounter.    ONCOLOGY HISTORY:   I have reviewed his chart and materials related to his cancer extensively and collaborated history with the patient. 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 There continues to be some borderline wall thickening in the jejunum, query chronic inflammatory process such as celiac disease.  Hepatic cysts. Right renal hypodense lesions are probably cysts but technically too small to characterize. -12/31/2014: Cystoscopy with TURBT of two tumors on left lateral wall of bladder, 2 cm and 3 cm in size.  Pathology of transurethral resection: High-grade papillary urothelial  carcinoma. No definite areas of invasive disease are identified. Smooth muscle is present for evaluation. TURBT Pathology: High-grade papillary urothelial carcinoma. No invasive disease is identified.  Smooth muscle is present for histologic evaluation. -12/2014-04/2015: Induction intravesical BCG  -08/12/2015: Bladder biopsy. Pathology: Non-invasive high-grade papillary urothelial carcinoma. Muscularis propia is present and not involved.  -08/2015-08/2016: Induction intravesical BCG followed by maintenance BCG -03/21/2017: TURBT. Pathology: Invasive high-grade papillary urothelial carcinoma involving detrusor muscle.  -05/02/2017: Neoadjuvant chemotherapy with gemcitabine  and cisplatin . Just received 1 cycle and did not want further chemotherapy -06/29/2017: Cystoprostatectomy with bilateral lymphadenectomy.  Pathology: Infiltrative high-grade urothelial carcinoma, 2.5 cm tumor size. The carcinoma macroscopically invades perivesical soft tissue (pT3b). Lymphovascular and perineural invasion is identified. Margins of resection negative for carcinoma. 13/13 lymph nodes negative for metastatic carcinoma.  -10/2017: ??Likely MRI showing Right ischium lytic lesion -12/08/2017: Right ischium biopsy. Pathology: Poorly differentiated carcinoma. Comment: PD-L1 [id number] CPS: (90%) -01/16/2018: XRT to the pelvis completed -01/30/2018-05/22/2018: 6 cycles of Carboplatin  and gemcitabine  completed -03/31/2018: CT CAP: Redemonstration of bilateral pulmonary metastasis. The majority of nodules are similar in size. One index nodule has minimally decreased. Progression of right inferior pubic ramus osseous metastasis. No new sites of soft tissue metastasis identified. -06/19/2018: MRI hip: Large expansile and destructive centrally necrotic mass of the right inferior pubic ramus, with pathologic fracture along its anterior margin, and with notable soft tissue extension. -06/21/2018-12/13/2018: Pembrolizumab  every 3 weeks,  discontinued due to skin toxicity -01/24/2019: CT CAP: The dominant left upper lobe pulmonary nodule is stable but the other smaller pulmonary nodules demonstrate reduced size/conspicuity. Essentially stable lytic mass of the right inferior pubic ramus. -03/05/2020: CT CAP: Enlarging right upper lobe pulmonary nodule compatible with enlarging neoplasm. Whether this represents a metastatic lesion or a primary bronchogenic neoplasm is uncertain. There is also an enlarged right paratracheal lymph node measuring 1.9 cm in short axis, new compared to the prior study, concerning for nodal metastatic disease. Large lytic lesion in the right inferior pubic ramus, similar to prior examinations, compatible with a metastatic lesion. -04/23/2020: 50 Gray in 10 fractions XRT completed to right paratracheal lymph node -10/22/2021: PET:  Hypermetabolic subcarinal, AP window, and bilateral hilar adenopathy. The 1.4 cm subcarinal lymph node is particularly hypermetabolic maximum SUV 21.1. Low-grade activity in the right apical density, maximum SUV 3.1, probably representing sequela of prior radiation therapy. 9 mm anterior left upper lobe nodule has a maximum SUV of 1.6. The large lytic lesion of the right pubic bone and ischium is relatively photopenic, favoring treated malignancy. -11/16/2021-11/27/2021: SBRT to PET positive intrathoracic lymph nodes -03/15/2022: PET: Overall decrease in hypermetabolism involving mediastinal and hilar lymph nodes, compatible with response to therapy. Probable post treatment scarring in the apical segment right upper lobe. 10 mm nodular lesion in the anterior left upper lobe. Focal anorectal hypermetabolism. Expansile lytic lesion and pathologic fracture involving the right inferior pubic ramus, without abnormal hypermetabolism, findings possibly representing treated metastatic disease. -11/25/2022: PET: No evidence of local recurrence of bladder cancer or metastatic disease. A previously  demonstrated mildly hypermetabolic subcarinal lymph node has decreased in metabolic activity. New patchy ground-glass opacities medially in both upper lobes with associated low level metabolic activity, likely inflammatory/infectious. Enlarging bilateral pleural effusions with increased dependent atelectasis in  both lungs. Stable treated lytic lesion in the right inferior pubic ramus and ischium. -12/20/2022: CT angio chest: Improved right pleural effusion with a small residual status post thoracentesis. No pneumothorax. Persistent left-sided pleural effusion. Multifocal areas of parenchymal opacity are again noted. Separate spiculated 11 mm left upper lobe lung nodule. -12/20/2022: Right thoracentesis with 1.8 L of fluid removed -12/21/2022: Left thoracentesis with 700 mL of fluid removed. Cytology was negative for carcinoma.  (No path to prove urothelial carcinoma) -01/07/2023-current: Keytruda  200 mg every 3 weeks  -05/15/2024: Change Keytruda  to 400 mg every 6 weeks -03/27/2024: CT CAP: Post treatment collapse/consolidation in the posterior segment right upper lobe with a rounded masslike area of low attenuation posteromedially. Difficult to exclude residual disease. Stable anterior segment left upper lobe pulmonary nodule. Stable osseous metastatic disease. Cysto prostatectomy with a right lower quadrant ileal conduit. Large loculated right pleural effusion, stable, with rounded atelectasis in the posterior right lower lobe. Small to moderate left pleural effusion, decreased.  Current Treatment:  Keytruda  400 mg every 6 weeks  INTERVAL HISTORY:   Discussed the use of AI scribe software for clinical note transcription with the patient, who gave verbal consent to proceed.  History of Present Illness [name] is an 83 year old male who presents for a follow-up visit regarding his metastatic bladder carcinoma  He has a good appetite but primarily consumes Boost nutritional drinks, averaging  three bottles a day, along with sweet foods like ice creams and candies. He enjoys ice cream, particularly strawberry and vanilla, and can consume large quantities if allowed. He also eats fruits like clementines and mandarin oranges, about three to four per day, and apples. Efforts are being made to shift his sweet intake towards fruits.  He is currently taking several medications including Tylenol , Symbicort , thyroid  medication, and a cough suppressant. He is also on 10 mg of prednisone  daily for a rash, and Atarax  for itching, which he takes every morning.  He has been experiencing congestion and cough, which have improved with the use of Mucinex . He continues to undergo fluid removal twice a week, with recent sessions removing around 600 - .    I have reviewed the past medical history, past surgical history, social history and family history with the patient and they are unchanged from previous note.  ALLERGIES:  is allergic to ciprofloxacin  and zofran  [ondansetron ].  MEDICATIONS:  Current Outpatient Medications  Medication Sig Dispense Refill   acetaminophen  (TYLENOL ) 500 MG tablet Take 1,000 mg by mouth every 6 (six) hours as needed for mild pain, fever, moderate pain or headache.     budesonide -formoterol  (SYMBICORT ) 160-4.5 MCG/ACT inhaler Inhale 2 puffs into the lungs 2 (two) times daily. 1 each 6   hydrOXYzine  (ATARAX ) 25 MG tablet Take 1 tablet (25 mg total) by mouth 3 (three) times daily as needed. 90 tablet 0   levothyroxine  (SYNTHROID ) 50 MCG tablet Take 50 mcg by mouth daily.     metoprolol  tartrate (LOPRESSOR ) 25 MG tablet Take 1 tablet (25 mg total) by mouth 2 (two) times daily. 60 tablet 2   pantoprazole  (PROTONIX ) 40 MG tablet Take 1 tablet (40 mg total) by mouth 2 (two) times daily. 60 tablet 0   predniSONE  (DELTASONE ) 10 MG tablet Take 1 tablet (10 mg total) by mouth daily with breakfast. (Patient taking differently: Take 10 mg by mouth daily with breakfast. Takes every  other day) 90 tablet 3   SSD 1 % cream Apply topically 2 (two) times daily. (Patient  not taking: Reported on 05/15/2024)     Vitamin D , Ergocalciferol , (DRISDOL ) 1.25 MG (50000 UNIT) CAPS capsule Take 50,000 Units by mouth once a week.     Current Facility-Administered Medications  Medication Dose Route Frequency Provider Last Rate Last Admin   omalizumab  (XOLAIR ) prefilled syringe 300 mg  300 mg Subcutaneous Q14 Days Iva Marty Saltness, MD   300 mg at 11/24/22 9143    REVIEW OF SYSTEMS:   Constitutional: Denies fevers, chills or abnormal weight loss Eyes: Denies blurriness of vision Ears, nose, mouth, throat, and face: Denies mucositis or sore throat Respiratory: Denies cough, dyspnea or wheezes Cardiovascular: Denies palpitation, chest discomfort or lower extremity swelling Gastrointestinal:  Denies nausea, heartburn or change in bowel habits Skin: Denies abnormal skin rashes Lymphatics: Denies new lymphadenopathy or easy bruising Neurological:Denies numbness, tingling or new weaknesses Behavioral/Psych: Mood is stable, no new changes  All other systems were reviewed with the patient and are negative.   VITALS:  There were no vitals taken for this visit.  Wt Readings from Last 3 Encounters:  05/15/24 185 lb (83.9 kg)  04/24/24 183 lb 12.8 oz (83.4 kg)  04/03/24 179 lb (81.2 kg)    There is no height or weight on file to calculate BMI.  Performance status (ECOG): 2 - Symptomatic, <50% confined to bed  PHYSICAL EXAM:   GENERAL:alert, no distress and comfortable SKIN:Erythematous rash- multiple papular, tiny in the inner side of his right upper arm. LYMPH:  no palpable lymphadenopathy in the cervical, axillary or inguinal LUNGS: Rales present in right upper lobe, decreased breath sounds in left lower lobe.  HEART: regular rate & rhythm and no murmurs and no lower extremity edema ABDOMEN:abdomen soft, non-tender and normal bowel sounds Musculoskeletal:no cyanosis of digits  and no clubbing  NEURO: alert & oriented x 3 with fluent speech  LABORATORY DATA:  I have reviewed the data as listed    Component Value Date/Time   NA 139 05/15/2024 0938   NA 140 04/30/2019 1033   NA 141 05/23/2017 0828   K 4.2 05/15/2024 0938   K 4.0 05/23/2017 0828   CL 103 05/15/2024 0938   CO2 26 05/15/2024 0938   CO2 27 05/23/2017 0828   GLUCOSE 103 (H) 05/15/2024 0938   GLUCOSE 97 05/23/2017 0828   BUN 25 (H) 05/15/2024 0938   BUN 16 04/30/2019 1033   BUN 12.5 05/23/2017 0828   CREATININE 1.01 05/15/2024 0938   CREATININE 1.07 07/02/2022 0935   CREATININE 0.8 05/23/2017 0828   CALCIUM 9.6 05/15/2024 0938   CALCIUM 9.0 05/23/2017 0828   PROT 5.9 (L) 05/15/2024 0938   PROT 5.7 (L) 04/30/2019 1033   PROT 5.6 (L) 05/23/2017 0828   ALBUMIN 3.4 (L) 05/15/2024 0938   ALBUMIN 3.7 04/30/2019 1033   ALBUMIN 2.5 (L) 05/23/2017 0828   AST 15 05/15/2024 0938   AST 13 (L) 07/02/2022 0935   AST 13 05/23/2017 0828   ALT 10 05/15/2024 0938   ALT 15 07/02/2022 0935   ALT 15 05/23/2017 0828   ALKPHOS 60 05/15/2024 0938   ALKPHOS 57 05/23/2017 0828   BILITOT 0.4 05/15/2024 0938   BILITOT 0.5 07/02/2022 0935   BILITOT 0.37 05/23/2017 0828   GFRNONAA >60 05/15/2024 0938   GFRNONAA >60 07/02/2022 0935   GFRAA >60 03/05/2020 1407     Lab Results  Component Value Date   WBC 10.3 05/15/2024   NEUTROABS 7.5 05/15/2024   HGB 14.7 05/15/2024   HCT 45.3 05/15/2024  MCV 105.3 (H) 05/15/2024   PLT 262 05/15/2024     Latest Reference Range & Units 06/27/24 08:48  TSH 0.350 - 4.500 uIU/mL 2.440    RADIOGRAPHIC STUDIES: I have personally reviewed the radiological images as listed and agreed with the findings in the report.  DG Chest 1 View CLINICAL DATA:  Status post thoracentesis  EXAM: DG CHEST 1V  COMPARISON:  Chest x-ray performed June 15, 2024  FINDINGS: Similar appearance of right-sided effusion, consolidation, and volume loss. Unchanged appearance of the  heart and mediastinum. Trace left pleural effusion. No pneumothorax.  IMPRESSION: 1. No pneumothorax. 2. Similar appearance of residual right effusion, consolidation, and volume loss.  Electronically Signed   By: Maude Naegeli M.D.   On: 06/26/2024 10:42    "

## 2024-06-27 ENCOUNTER — Inpatient Hospital Stay (HOSPITAL_BASED_OUTPATIENT_CLINIC_OR_DEPARTMENT_OTHER): Admitting: Oncology

## 2024-06-27 ENCOUNTER — Encounter: Payer: Self-pay | Admitting: Oncology

## 2024-06-27 ENCOUNTER — Inpatient Hospital Stay: Attending: Oncology

## 2024-06-27 ENCOUNTER — Inpatient Hospital Stay

## 2024-06-27 VITALS — BP 118/72 | HR 72 | Temp 97.9°F | Resp 17 | Wt 190.0 lb

## 2024-06-27 VITALS — BP 114/63 | HR 74 | Temp 98.6°F | Resp 16

## 2024-06-27 DIAGNOSIS — C679 Malignant neoplasm of bladder, unspecified: Secondary | ICD-10-CM | POA: Insufficient documentation

## 2024-06-27 DIAGNOSIS — C7801 Secondary malignant neoplasm of right lung: Secondary | ICD-10-CM | POA: Diagnosis not present

## 2024-06-27 DIAGNOSIS — C7802 Secondary malignant neoplasm of left lung: Secondary | ICD-10-CM | POA: Diagnosis not present

## 2024-06-27 DIAGNOSIS — Z79620 Long term (current) use of immunosuppressive biologic: Secondary | ICD-10-CM | POA: Diagnosis not present

## 2024-06-27 DIAGNOSIS — J90 Pleural effusion, not elsewhere classified: Secondary | ICD-10-CM | POA: Diagnosis not present

## 2024-06-27 DIAGNOSIS — R053 Chronic cough: Secondary | ICD-10-CM

## 2024-06-27 DIAGNOSIS — C7951 Secondary malignant neoplasm of bone: Secondary | ICD-10-CM | POA: Insufficient documentation

## 2024-06-27 DIAGNOSIS — R21 Rash and other nonspecific skin eruption: Secondary | ICD-10-CM

## 2024-06-27 DIAGNOSIS — Z5112 Encounter for antineoplastic immunotherapy: Secondary | ICD-10-CM | POA: Insufficient documentation

## 2024-06-27 DIAGNOSIS — C771 Secondary and unspecified malignant neoplasm of intrathoracic lymph nodes: Secondary | ICD-10-CM

## 2024-06-27 LAB — CBC WITH DIFFERENTIAL/PLATELET
Abs Immature Granulocytes: 0.11 K/uL — ABNORMAL HIGH (ref 0.00–0.07)
Basophils Absolute: 0.1 K/uL (ref 0.0–0.1)
Basophils Relative: 1 %
Eosinophils Absolute: 0.5 K/uL (ref 0.0–0.5)
Eosinophils Relative: 4 %
HCT: 46.5 % (ref 39.0–52.0)
Hemoglobin: 15 g/dL (ref 13.0–17.0)
Immature Granulocytes: 1 %
Lymphocytes Relative: 4 %
Lymphs Abs: 0.4 K/uL — ABNORMAL LOW (ref 0.7–4.0)
MCH: 33.4 pg (ref 26.0–34.0)
MCHC: 32.3 g/dL (ref 30.0–36.0)
MCV: 103.6 fL — ABNORMAL HIGH (ref 80.0–100.0)
Monocytes Absolute: 1.1 K/uL — ABNORMAL HIGH (ref 0.1–1.0)
Monocytes Relative: 10 %
Neutro Abs: 9.5 K/uL — ABNORMAL HIGH (ref 1.7–7.7)
Neutrophils Relative %: 80 %
Platelets: 250 K/uL (ref 150–400)
RBC: 4.49 MIL/uL (ref 4.22–5.81)
RDW: 12.9 % (ref 11.5–15.5)
WBC: 11.8 K/uL — ABNORMAL HIGH (ref 4.0–10.5)
nRBC: 0 % (ref 0.0–0.2)

## 2024-06-27 LAB — COMPREHENSIVE METABOLIC PANEL WITH GFR
ALT: 16 U/L (ref 0–44)
AST: 20 U/L (ref 15–41)
Albumin: 3.5 g/dL (ref 3.5–5.0)
Alkaline Phosphatase: 55 U/L (ref 38–126)
Anion gap: 8 (ref 5–15)
BUN: 17 mg/dL (ref 8–23)
CO2: 29 mmol/L (ref 22–32)
Calcium: 9.3 mg/dL (ref 8.9–10.3)
Chloride: 102 mmol/L (ref 98–111)
Creatinine, Ser: 0.95 mg/dL (ref 0.61–1.24)
GFR, Estimated: >60 mL/min (ref >60)
Glucose, Bld: 98 mg/dL (ref 70–99)
Potassium: 4.1 mmol/L (ref 3.5–5.1)
Sodium: 140 mmol/L (ref 135–145)
Total Bilirubin: 0.3 mg/dL (ref 0.0–1.2)
Total Protein: 6 g/dL — ABNORMAL LOW (ref 6.5–8.1)

## 2024-06-27 LAB — TSH: TSH: 2.44 u[IU]/mL (ref 0.350–4.500)

## 2024-06-27 LAB — MAGNESIUM: Magnesium: 2.5 mg/dL — ABNORMAL HIGH (ref 1.7–2.4)

## 2024-06-27 MED ORDER — SODIUM CHLORIDE 0.9 % IV SOLN
400.0000 mg | Freq: Once | INTRAVENOUS | Status: AC
  Administered 2024-06-27: 400 mg via INTRAVENOUS
  Filled 2024-06-27: qty 16

## 2024-06-27 MED ORDER — SODIUM CHLORIDE 0.9 % IV SOLN: Freq: Once | INTRAVENOUS | Status: AC

## 2024-06-27 NOTE — Patient Instructions (Signed)
 [facility] [place] - A DEPT OF [facility]  Discharge Instructions: Thank you for choosing [facility] to provide your oncology and hematology care.  If you have a lab appointment with the Cancer Center - please note that after April 8th, 2024, all labs will be drawn in the cancer center.  You do not have to check in or register with the main entrance as you have in the past but will complete your check-in in the cancer center.  Wear comfortable clothing and clothing appropriate for easy access to any Portacath or PICC line.   We strive to give you quality time with your provider. You may need to reschedule your appointment if you arrive late (15 or more minutes).  Arriving late affects you and other patients whose appointments are after yours.  Also, if you miss three or more appointments without notifying the office, you may be dismissed from the clinic at the provider's discretion.      For prescription refill requests, have your pharmacy contact our office and allow 72 hours for refills to be completed.    Today you received the following chemotherapy and/or immunotherapy agents keytruda     To help prevent nausea and vomiting after your treatment, we encourage you to take your nausea medication as directed.  BELOW ARE SYMPTOMS THAT SHOULD BE REPORTED IMMEDIATELY: *FEVER GREATER THAN 100.4 F (38 C) OR HIGHER *CHILLS OR SWEATING *NAUSEA AND VOMITING THAT IS NOT CONTROLLED WITH YOUR NAUSEA MEDICATION *UNUSUAL SHORTNESS OF BREATH *UNUSUAL BRUISING OR BLEEDING *URINARY PROBLEMS (pain or burning when urinating, or frequent urination) *BOWEL PROBLEMS (unusual diarrhea, constipation, pain near the anus) TENDERNESS IN MOUTH AND THROAT WITH OR WITHOUT PRESENCE OF ULCERS (sore throat, sores in mouth, or a toothache) UNUSUAL RASH, SWELLING OR PAIN  UNUSUAL VAGINAL DISCHARGE OR ITCHING   Items with * indicate a potential emergency and should be followed up as  soon as possible or go to the Emergency Department if any problems should occur.  Please show the CHEMOTHERAPY ALERT CARD or IMMUNOTHERAPY ALERT CARD at check-in to the Emergency Department and triage nurse.  Should you have questions after your visit or need to cancel or reschedule your appointment, please contact [facility] [place] - A DEPT OF [facility] [phone]  and follow the prompts.  Office hours are 8:00 a.m. to 4:30 p.m. Monday - Friday. Please note that voicemails left after 4:00 p.m. may not be returned until the following business day.  We are closed weekends and major holidays. You have access to a nurse at all times for urgent questions. Please call the main number to the clinic [phone] and follow the prompts.  For any non-urgent questions, you may also contact your provider using MyChart. We now offer e-Visits for anyone 47 and older to request care online for non-urgent symptoms. For details visit [email or web address].   Also download the MyChart app! Go to the app store, search MyChart, open the app, select  Run, and log in with your MyChart username and password.

## 2024-06-27 NOTE — Progress Notes (Signed)
 Patient tolerated chemotherapy with no complaints voiced.  Side effects with management reviewed with understanding verbalized.  IV site clean and dry with no bruising or swelling noted at site.  Good blood return noted before and after administration of chemotherapy.  Band aid applied.  Patient left in satisfactory condition with VSS and no s/s of distress noted. All follow ups as scheduled.   [name] [employer]

## 2024-06-27 NOTE — Patient Instructions (Signed)

## 2024-06-27 NOTE — Progress Notes (Signed)
 Patient has been examined by Dr. Davonna. Vital signs and labs have been reviewed by MD - ANC, Creatinine, LFTs, hemoglobin, and platelets have been reviewed by M.D. - pt may proceed with treatment.  Primary RN and pharmacy notified.

## 2024-06-28 ENCOUNTER — Encounter: Payer: Self-pay | Admitting: Oncology

## 2024-06-28 LAB — T4: T4, Total: 7.7 ug/dL (ref 4.5–12.0)

## 2024-06-29 ENCOUNTER — Ambulatory Visit (HOSPITAL_COMMUNITY)

## 2024-07-06 ENCOUNTER — Ambulatory Visit (HOSPITAL_COMMUNITY)
Admission: RE | Admit: 2024-07-06 | Discharge: 2024-07-06 | Disposition: A | Discharge status: Home / Self Care | Source: Ambulatory Visit | Attending: Physician Assistant | Admitting: Physician Assistant

## 2024-07-06 ENCOUNTER — Ambulatory Visit (HOSPITAL_COMMUNITY)
Admission: RE | Admit: 2024-07-06 | Discharge: 2024-07-06 | Disposition: A | Discharge status: Home / Self Care | Source: Ambulatory Visit | Attending: Oncology | Admitting: Oncology

## 2024-07-06 DIAGNOSIS — J90 Pleural effusion, not elsewhere classified: Secondary | ICD-10-CM | POA: Insufficient documentation

## 2024-07-06 DIAGNOSIS — R9389 Abnormal findings on diagnostic imaging of other specified body structures: Secondary | ICD-10-CM | POA: Diagnosis not present

## 2024-07-06 MED ORDER — LIDOCAINE HCL (PF) 2 % IJ SOLN
10.0000 mL | Freq: Once | INTRAMUSCULAR | Status: AC
  Administered 2024-07-06: 10 mL via INTRADERMAL

## 2024-07-06 MED ORDER — LIDOCAINE HCL (PF) 2 % IJ SOLN
INTRAMUSCULAR | Status: AC
Start: 2024-07-06 — End: 2024-07-06
  Filled 2024-07-06: qty 10

## 2024-07-06 MED ORDER — LIDOCAINE HCL (PF) 2 % IJ SOLN
INTRAMUSCULAR | Status: AC
  Filled 2024-07-06: qty 10

## 2024-07-06 NOTE — Procedures (Signed)
.  PROCEDURE SUMMARY:  Successful image-guided right thoracentesis. Yielded 800 milliliters of clear yellow fluid. Patient tolerated procedure well. EBL < 1 mL No immediate complications.  Specimen was not sent for labs. Post procedure CXR shows no pneumothorax.  Please see imaging section of Epic for full dictation.  Clotilda DELENA Hesselbach PA-C 07/06/2024 1:30 PM

## 2024-07-06 NOTE — Progress Notes (Signed)
 Patient brought ot US  Rm 1 in no obvious distress. Thoracentesis explained. Consent obtained. Prepped and draped in sterile fashion. US  imaging obtained. Local anesthetic admin without adverse event. Access obtained at Right post thorax. Clear amber serous fluid drawn off under vacutainer suction. 800cc total retrieved without complication. Access removed without incident, bandage placed. No bleeding no hematoma. Transported to Xray.

## 2024-07-09 ENCOUNTER — Other Ambulatory Visit: Payer: Self-pay | Admitting: *Deleted

## 2024-07-09 DIAGNOSIS — J90 Pleural effusion, not elsewhere classified: Secondary | ICD-10-CM

## 2024-07-13 ENCOUNTER — Ambulatory Visit (HOSPITAL_COMMUNITY)

## 2024-07-16 ENCOUNTER — Ambulatory Visit: Admitting: Physician Assistant

## 2024-07-16 ENCOUNTER — Encounter: Payer: Self-pay | Admitting: Physician Assistant

## 2024-07-16 VITALS — BP 109/73 | HR 68 | Temp 98.1°F | Resp 95 | Ht 71.5 in | Wt 188.5 lb

## 2024-07-16 DIAGNOSIS — C7951 Secondary malignant neoplasm of bone: Secondary | ICD-10-CM

## 2024-07-16 DIAGNOSIS — I4891 Unspecified atrial fibrillation: Secondary | ICD-10-CM

## 2024-07-16 DIAGNOSIS — C679 Malignant neoplasm of bladder, unspecified: Secondary | ICD-10-CM

## 2024-07-16 DIAGNOSIS — Z7689 Persons encountering health services in other specified circumstances: Secondary | ICD-10-CM

## 2024-07-16 DIAGNOSIS — C7800 Secondary malignant neoplasm of unspecified lung: Secondary | ICD-10-CM

## 2024-07-16 MED ORDER — VITAMIN D (ERGOCALCIFEROL) 1.25 MG (50000 UNIT) PO CAPS: 50000.0000 [IU] | ORAL_CAPSULE | ORAL | 1 refills | Status: AC

## 2024-07-16 NOTE — Assessment & Plan Note (Signed)
 Bladder cancer with metastasis to pelvic bone and lung. Managed by oncology with current 6 weeks Keytruda  infusions. Malignant pleural effusion causing recurrent fluid accumulation. Cough and congestion related to fluid accumulation. Mucinex  used for symptom management. - Continue current treatment plan and follow-up with oncology team. - Continue Mucinex  for cough and congestion as needed. - Continue weekly drainage of pleural effusion. - Discuss with oncology team regarding management of pleural effusion and cough.

## 2024-07-16 NOTE — Progress Notes (Signed)
 New Patient Office Visit  Subjective    Patient ID: [name], male    DOB: Mar 04, 1941  Age: 83 y.o. MRN: [id number]  CC:  Chief Complaint  Patient presents with   New Patient (Initial Visit)    Patient is a new patient today. Wants to discuss medication he has been on and still needs to take them (Vit D) with the thyroid  medication.  He has infusion every 6 weeks now.  He has a bag from having bladder cancer.  He has fluid in his lungs    HPI ZIARE CRYDER presents to establish care  Discussed the use of AI scribe software for clinical note transcription with the patient, who gave verbal consent to proceed.  History of Present Illness [name] is an 83 year old male with metastatic bladder cancer who presents to establish care and management of ongoing symptoms.  He has metastatic bladder cancer with pelvic bone and lung involvement. He had bladder removal in 2021 and now uses a urostomy bag. He receives Keytruda  infusions for cancer every six weeks.  He has recurrent pleural effusions for the past six months, now requiring thoracentesis about every other week. He has persistent cough and congestion and takes Mucinex  every 15 to 16 hours as needed for relief.  He has atrial fibrillation treated with metoprolol . He takes prednisone  and hydroxyzine  for a cancer treatment related rash. He stopped using a Symbicort  inhaler by preference. Additionally, he takes levothyroxine  for thyroid  related cancer treatment complications.   His appetite is decreased, likely related to cancer treatment. He mainly takes Boost drinks and ice cream and tries to eat at least one regular meal daily.      Outpatient Encounter Medications as of 07/16/2024  Medication Sig   acetaminophen  (TYLENOL ) 500 MG tablet Take 1,000 mg by mouth every 6 (six) hours as needed for mild pain, fever, moderate pain or headache.   budesonide -formoterol  (SYMBICORT ) 160-4.5 MCG/ACT inhaler Inhale 2 puffs into  the lungs 2 (two) times daily.   hydrOXYzine  (ATARAX ) 25 MG tablet Take 1 tablet (25 mg total) by mouth 3 (three) times daily as needed.   levothyroxine  (SYNTHROID ) 50 MCG tablet Take 50 mcg by mouth daily.   metoprolol  tartrate (LOPRESSOR ) 25 MG tablet Take 1 tablet (25 mg total) by mouth 2 (two) times daily.   pantoprazole  (PROTONIX ) 40 MG tablet Take 1 tablet (40 mg total) by mouth 2 (two) times daily.   predniSONE  (DELTASONE ) 10 MG tablet Take 1 tablet (10 mg total) by mouth daily with breakfast. (Patient not taking: Reported on 06/27/2024)   SSD 1 % cream Apply topically 2 (two) times daily. (Patient not taking: Reported on 06/27/2024)   Vitamin D , Ergocalciferol , (DRISDOL ) 1.25 MG (50000 UNIT) CAPS capsule Take 1 capsule (50,000 Units total) by mouth once a week.   [DISCONTINUED] Vitamin D , Ergocalciferol , (DRISDOL ) 1.25 MG (50000 UNIT) CAPS capsule Take 50,000 Units by mouth once a week.   Facility-Administered Encounter Medications as of 07/16/2024  Medication   omalizumab  (XOLAIR ) prefilled syringe 300 mg   [COMPLETED] pembrolizumab  (KEYTRUDA ) 400 mg in sodium chloride  0.9 % 50 mL chemo infusion    Past Medical History:  Diagnosis Date   Bladder cancer (HCC)    Bone cancer (HCC)    BPH (benign prostatic hypertrophy)    Dysuria    GERD (gastroesophageal reflux disease)    History of adenomatous polyp of colon    tubular adenoma 06/ 2018   History of bladder  cancer 12/2014   urologist-  dr budzyn--  dx High Grade TCC without stromal invasion s/p TURBT and intravesical BCG tx/  recurrent 07/2015 (pTa) TCC  repear intravesical BCG tx    History of hiatal hernia    History of urethral stricture    Nocturia     Past Surgical History:  Procedure Laterality Date   CARDIAC CATHETERIZATION  1997   normal coronary arteries   CARDIOVASCULAR STRESS TEST  12-14-2005   Low risk perfusion study/  very small scar in the inferior septum from mid ventricle to apex,  no ischemia/  mild  inferior septal hypokinesis/  ef 58%   CATARACT EXTRACTION W/ INTRAOCULAR LENS  IMPLANT, BILATERAL  2011   COLONOSCOPY  last one 06/ 2018   CYSTOSCOPY WITH BIOPSY N/A 08/12/2015   Procedure: CYSTOSCOPY WITH BIOPSY AND FULGERATION;  Surgeon: Redell Lynwood Napoleon, MD;  Location[facility];  Service: Urology;  Laterality: N/A;   CYSTOSCOPY WITH INJECTION N/A 06/29/2017   Procedure: CYSTOSCOPY WITH INJECTION OF INDOCYANINE GREEN DYE;  Surgeon: Alvaro Hummer, MD;  Location: [facility];  Service: Urology;  Laterality: N/A;   CYSTOSCOPY WITH URETHRAL DILATATION N/A 03/21/2017   Procedure: CYSTOSCOPY;  Surgeon: Napoleon Redell Lynwood, MD;  Location: [facility];  Service: Urology;  Laterality: N/A;   ESOPHAGOGASTRODUODENOSCOPY  12-05-2014   IR IMAGING GUIDED PORT INSERTION  02/07/2018   IR NEPHROSTOMY PLACEMENT LEFT  12/25/2021   IR REMOVAL TUN ACCESS W/ PORT W/O FL MOD SED  04/14/2018   TRANSTHORACIC ECHOCARDIOGRAM  01-31-2008   nomral LVF,  ef 55-65%/  mild pulmonic stenosis without regurg.,  peak transpulomonic valve gradient   TRANSURETHRAL RESECTION OF BLADDER TUMOR N/A 12/31/2014   Procedure: TRANSURETHRAL RESECTION OF BLADDER TUMOR (TURBT);  Surgeon: Oliva Oiler, MD;  Location: [facility];  Service: Urology;  Laterality: N/A;   TRANSURETHRAL RESECTION OF BLADDER TUMOR N/A 03/21/2017   Procedure: POSSIBLE TRANSURETHRAL RESECTION OF BLADDER TUMOR (TURBT);  Surgeon: Napoleon Redell Lynwood, MD;  Location: [facility];  Service: Urology;  Laterality: N/A;    Family History  Problem Relation Age of Onset   Alcoholism Father    Colon cancer Neg Hx    Colon polyps Neg Hx    Diabetes Neg Hx    Kidney disease Neg Hx    Esophageal cancer Neg Hx    Heart disease Neg Hx    Gallbladder disease Neg Hx    Allergic rhinitis Neg Hx    Angioedema Neg Hx    Asthma Neg Hx    Atopy Neg Hx    Eczema Neg Hx    Immunodeficiency Neg Hx    Urticaria Neg Hx      Social History   Socioeconomic History   Marital status: Married    Spouse name: Not on file   Number of children: 1   Years of education: Not on file   Highest education level: Not on file  Occupational History   Occupation: Retired  Tobacco Use   Smoking status: Former    Current packs/day: 0.00    Average packs/day: 1 pack/day for 34.0 years (34.0 ttl pk-yrs)    Types: Cigarettes    Start date: 12/27/1954    Quit date: 12/26/1988    Years since quitting: 35.5   Smokeless tobacco: Never  Vaping Use   Vaping status: Never Used  Substance and Sexual Activity   Alcohol use: Yes    Alcohol/week: 6.0 standard drinks of  alcohol    Types: 6 Cans of beer per week    Comment: BEER   Drug use: No   Sexual activity: Not Currently  Other Topics Concern   Not on file  Social History Narrative   Not on file   Social Drivers of Health   Financial Resource Strain: Not on file  Food Insecurity: No Food Insecurity (12/20/2022)   Hunger Vital Sign    Worried About Running Out of Food in the Last Year: Never true    Ran Out of Food in the Last Year: Never true  Transportation Needs: No Transportation Needs (12/20/2022)   PRAPARE - Administrator, Civil Service (Medical): No    Lack of Transportation (Non-Medical): No  Physical Activity: Not on file  Stress: Not on file  Social Connections: Unknown (12/28/2021)   [facility] from [facility]   Social Network    Social Network: Not on file  Intimate Partner Violence: Not At Risk (12/20/2022)   Humiliation, Afraid, Rape, and Kick questionnaire    Fear of Current or Ex-Partner: No    Emotionally Abused: No    Physically Abused: No    Sexually Abused: No    Review of Systems  Constitutional:  Positive for malaise/fatigue. Negative for chills and fever.  Eyes:  Negative for blurred vision and double vision.  Respiratory:  Positive for cough and sputum production. Negative for shortness of breath and wheezing.    Cardiovascular:  Negative for chest pain and palpitations.  Skin:  Positive for itching and rash.  Neurological:  Negative for dizziness and headaches.      Objective    BP 109/73 (BP Location: Left Arm, Patient Position: Sitting)   Pulse 68   Temp 98.1 F (36.7 C)   Resp (!) 95   Ht 5' 11.5 (1.816 m)   Wt 188 lb 8 oz (85.5 kg)   BMI 25.92 kg/m   Physical Exam Constitutional:      General: He is not in acute distress.    Appearance: Normal appearance. He is normal weight. He is not ill-appearing.  HENT:     Head: Normocephalic and atraumatic.     Mouth/Throat:     Mouth: Mucous membranes are moist.     Pharynx: Oropharynx is clear.  Eyes:     Extraocular Movements: Extraocular movements intact.     Conjunctiva/sclera: Conjunctivae normal.  Cardiovascular:     Rate and Rhythm: Normal rate and regular rhythm.     Heart sounds: Normal heart sounds. No murmur heard. Pulmonary:     Effort: Pulmonary effort is normal.     Breath sounds: Rales present. No wheezing or rhonchi.  Skin:    General: Skin is warm and dry.  Neurological:     General: No focal deficit present.     Mental Status: He is alert and oriented to person, place, and time.  Psychiatric:        Mood and Affect: Mood normal.        Behavior: Behavior normal.        Assessment & Plan:  Encounter to establish care  Malignant neoplasm of urinary bladder, unspecified site [facility]) Assessment & Plan: Bladder cancer with metastasis to pelvic bone and lung. Managed by oncology with current 6 weeks Keytruda  infusions. Malignant pleural effusion causing recurrent fluid accumulation. Cough and congestion related to fluid accumulation. Mucinex  used for symptom management. - Continue current treatment plan and follow-up with oncology team. - Continue Mucinex  for cough and  congestion as needed. - Continue weekly drainage of pleural effusion. - Discuss with oncology team regarding management of pleural effusion and  cough.   Other orders -     Vitamin D (Ergocalciferol); Take 1 capsule (50,000 Units total) by mouth once a week.  Dispense: 6 capsule; Refill: 1   Return in about 6 months (around 01/14/2025) for F/u and Medicare AWV.   Charmaine Aubrina Nieman, PA-C

## 2024-07-17 ENCOUNTER — Other Ambulatory Visit: Payer: Self-pay

## 2024-07-20 ENCOUNTER — Ambulatory Visit (HOSPITAL_COMMUNITY)

## 2024-07-23 ENCOUNTER — Ambulatory Visit (HOSPITAL_COMMUNITY): Admission: RE | Admit: 2024-07-23

## 2024-07-25 ENCOUNTER — Ambulatory Visit (HOSPITAL_COMMUNITY): Admission: RE | Admit: 2024-07-25 | Discharge: 2024-07-25 | Attending: Oncology

## 2024-07-25 ENCOUNTER — Ambulatory Visit (HOSPITAL_COMMUNITY)
Admission: RE | Admit: 2024-07-25 | Discharge: 2024-07-25 | Disposition: A | Discharge status: Home / Self Care | Source: Ambulatory Visit | Attending: Physician Assistant | Admitting: Physician Assistant

## 2024-07-25 ENCOUNTER — Encounter (HOSPITAL_COMMUNITY): Payer: Self-pay

## 2024-07-25 DIAGNOSIS — Z8551 Personal history of malignant neoplasm of bladder: Secondary | ICD-10-CM | POA: Diagnosis not present

## 2024-07-25 DIAGNOSIS — J90 Pleural effusion, not elsewhere classified: Secondary | ICD-10-CM | POA: Insufficient documentation

## 2024-07-25 DIAGNOSIS — Z9889 Other specified postprocedural states: Secondary | ICD-10-CM | POA: Diagnosis not present

## 2024-07-25 MED ORDER — LIDOCAINE HCL (PF) 2 % IJ SOLN
INTRAMUSCULAR | Status: AC
  Filled 2024-07-25: qty 10

## 2024-07-25 MED ORDER — LIDOCAINE HCL (PF) 2 % IJ SOLN
10.0000 mL | Freq: Once | INTRAMUSCULAR | Status: AC
  Administered 2024-07-25: 10 mL

## 2024-07-25 NOTE — Progress Notes (Signed)
 Patient tolerated Right Thoracentesis procedure well today and 800 ml of clear yellow pleural fluid removed. Patient verbalized understanding of discharge instructions and transported via wheelchair to xray at this time for post chest xray with no acute distress noted.

## 2024-07-25 NOTE — Procedures (Signed)
 PROCEDURE SUMMARY:  Successful image-guided right thoracentesis. Yielded 800 milliliters of clear yellow fluid. Patient tolerated procedure well. EBL < 1 mL. No immediate complications.  Specimen was not sent for labs.  Post procedure CXR shows no definitive pneumothorax, possible small apical pneumothorax. Patient denies dyspnea or chest pain, reports he feels fine overall. No indication for repeat CXR at this time, discussed ED return precautions if patient were to become symptomatic which he understands.  Please see imaging section of Epic for full dictation.  Clotilda DELENA Hesselbach PA-C 07/25/2024 10:43 AM

## 2024-07-27 ENCOUNTER — Telehealth: Payer: Self-pay | Admitting: Physician Assistant

## 2024-07-27 NOTE — Telephone Encounter (Signed)
 Prescription for metoprolol  tartrate (LOPRESSOR ) 25 MG tablet   Prescribe by Clanford Johnson last filled 04/17/22  [facility]

## 2024-07-30 ENCOUNTER — Other Ambulatory Visit: Payer: Self-pay | Admitting: Physician Assistant

## 2024-07-30 MED ORDER — METOPROLOL TARTRATE 25 MG PO TABS: 25.0000 mg | ORAL_TABLET | Freq: Two times a day (BID) | ORAL | 2 refills | Status: AC

## 2024-07-30 NOTE — Telephone Encounter (Unsigned)
 Copied from CRM #[id number]. Topic: Clinical - Medication Refill >> Jul 30, 2024  9:27 AM Selinda RAMAN wrote: Medication: metoprolol  tartrate (LOPRESSOR ) 25 MG tablet  Has the patient contacted their pharmacy? Yes   This is the patient's preferred pharmacy:  [facility] [place], [place] - [id number] [place] [address] Phone: [phone] Fax: [phone]  Is this the correct pharmacy for this prescription? Yes If no, delete pharmacy and type the correct one.   Has the prescription been filled recently? No  Is the patient out of the medication? No but he only has a few days left  Has the patient been seen for an appointment in the last year OR does the patient have an upcoming appointment? Yes  Can we respond through MyChart? No he would prefer a call or text please  Paek the spouse of the patient wants to make sure this is a 3 month supply or #180 tablets as was last prescribed by Dr Bertell at [place]. Please assist patient further.

## 2024-07-31 ENCOUNTER — Other Ambulatory Visit: Payer: Self-pay | Admitting: Oncology

## 2024-07-31 ENCOUNTER — Ambulatory Visit (HOSPITAL_COMMUNITY)
Admission: RE | Admit: 2024-07-31 | Discharge: 2024-07-31 | Disposition: A | Source: Ambulatory Visit | Attending: Oncology

## 2024-07-31 DIAGNOSIS — C771 Secondary and unspecified malignant neoplasm of intrathoracic lymph nodes: Secondary | ICD-10-CM

## 2024-07-31 DIAGNOSIS — C679 Malignant neoplasm of bladder, unspecified: Secondary | ICD-10-CM

## 2024-07-31 MED ORDER — IOHEXOL 300 MG/ML  SOLN
100.0000 mL | Freq: Once | INTRAMUSCULAR | Status: AC | PRN
  Administered 2024-07-31: 16:00:00 100 mL via INTRAVENOUS

## 2024-08-01 ENCOUNTER — Other Ambulatory Visit: Payer: Self-pay | Admitting: *Deleted

## 2024-08-01 MED ORDER — HYDROXYZINE HCL 25 MG PO TABS: 25.0000 mg | ORAL_TABLET | Freq: Three times a day (TID) | ORAL | 0 refills | Status: AC | PRN

## 2024-08-02 ENCOUNTER — Encounter: Payer: Self-pay | Admitting: Oncology

## 2024-08-02 ENCOUNTER — Telehealth: Payer: Self-pay | Admitting: Pharmacy Technician

## 2024-08-02 NOTE — Telephone Encounter (Signed)
 Oral Oncology Patient Advocate Encounter   Re-authorization   Received notification that prior authorization for hydroxyzine  is required.   PA submitted on CMM via Latent Key [id number] Status is pending     [name] (Patty) Chet Burnet, CPhT  [facility], [place], [place] [facility] - Oral Chemotherapy Patient Advocate Specialist III Phone: [phone]  Fax: [phone]

## 2024-08-02 NOTE — Telephone Encounter (Signed)
 Oral Oncology Patient Advocate Encounter  Prior Authorization for hydroxyzine  has been approved.    PA# [id number] Effective dates: 08/02/2024 through 08/02/2025  Barbette (Patty) Chet Burnet, CPhT  [facility] - [facility], [place], [place] Hematology/Oncology - Oral Chemotherapy Patient Advocate Specialist III Phone: [phone]  Fax: [phone]

## 2024-08-03 ENCOUNTER — Ambulatory Visit (HOSPITAL_COMMUNITY)
Admission: RE | Admit: 2024-08-03 | Discharge: 2024-08-03 | Disposition: A | Discharge status: Home / Self Care | Source: Ambulatory Visit | Attending: Oncology | Admitting: Oncology

## 2024-08-03 ENCOUNTER — Ambulatory Visit (HOSPITAL_COMMUNITY)
Admission: RE | Admit: 2024-08-03 | Discharge: 2024-08-03 | Disposition: A | Source: Ambulatory Visit | Attending: Physician Assistant

## 2024-08-03 ENCOUNTER — Encounter (HOSPITAL_COMMUNITY): Payer: Self-pay

## 2024-08-03 DIAGNOSIS — C799 Secondary malignant neoplasm of unspecified site: Secondary | ICD-10-CM | POA: Diagnosis not present

## 2024-08-03 DIAGNOSIS — J90 Pleural effusion, not elsewhere classified: Secondary | ICD-10-CM | POA: Diagnosis present

## 2024-08-03 DIAGNOSIS — C679 Malignant neoplasm of bladder, unspecified: Secondary | ICD-10-CM | POA: Diagnosis not present

## 2024-08-03 MED ORDER — LIDOCAINE HCL (PF) 2 % IJ SOLN
10.0000 mL | Freq: Once | INTRAMUSCULAR | Status: AC
  Administered 2024-08-03: 10 mL

## 2024-08-03 MED ORDER — LIDOCAINE HCL (PF) 2 % IJ SOLN
INTRAMUSCULAR | Status: AC
  Filled 2024-08-03: qty 10

## 2024-08-03 NOTE — Procedures (Signed)
 PROCEDURE SUMMARY:  Successful image-guided right thoracentesis. Yielded 500 milliliters of clear yellow fluid. Patient tolerated procedure well. EBL < 1 mL. No immediate complications.  Specimen was not sent for labs. Post procedure CXR shows no pneumothorax.  Please see imaging section of Epic for full dictation.  Clotilda DELENA Hesselbach PA-C 08/03/2024 10:25 AM

## 2024-08-03 NOTE — Progress Notes (Signed)
 Patient tolerated Right Thoracentesis procedure well today and 500 ml of clear yellow pleural fluid removed. Patient verbalized understanding of discharge instructions and transported via wheelchair to xray at this time for post chest xray with no acute distress noted. Wife updated of all upcoming appointments for radiology.

## 2024-08-07 ENCOUNTER — Inpatient Hospital Stay: Admitting: Oncology

## 2024-08-07 ENCOUNTER — Inpatient Hospital Stay: Attending: Oncology

## 2024-08-07 ENCOUNTER — Inpatient Hospital Stay

## 2024-08-07 VITALS — BP 109/56 | HR 80 | Temp 98.6°F | Resp 20

## 2024-08-07 VITALS — BP 139/77 | HR 89 | Temp 98.6°F | Resp 22 | Wt 195.5 lb

## 2024-08-07 DIAGNOSIS — C679 Malignant neoplasm of bladder, unspecified: Secondary | ICD-10-CM

## 2024-08-07 DIAGNOSIS — C7801 Secondary malignant neoplasm of right lung: Secondary | ICD-10-CM | POA: Insufficient documentation

## 2024-08-07 DIAGNOSIS — C7802 Secondary malignant neoplasm of left lung: Secondary | ICD-10-CM | POA: Diagnosis not present

## 2024-08-07 DIAGNOSIS — R053 Chronic cough: Secondary | ICD-10-CM | POA: Diagnosis not present

## 2024-08-07 DIAGNOSIS — R21 Rash and other nonspecific skin eruption: Secondary | ICD-10-CM

## 2024-08-07 DIAGNOSIS — Z79620 Long term (current) use of immunosuppressive biologic: Secondary | ICD-10-CM | POA: Diagnosis not present

## 2024-08-07 DIAGNOSIS — Z5112 Encounter for antineoplastic immunotherapy: Secondary | ICD-10-CM | POA: Diagnosis present

## 2024-08-07 DIAGNOSIS — J90 Pleural effusion, not elsewhere classified: Secondary | ICD-10-CM | POA: Diagnosis not present

## 2024-08-07 DIAGNOSIS — C7951 Secondary malignant neoplasm of bone: Secondary | ICD-10-CM | POA: Diagnosis not present

## 2024-08-07 LAB — COMPREHENSIVE METABOLIC PANEL WITH GFR
ALT: 12 U/L (ref 0–44)
AST: 16 U/L (ref 15–41)
Albumin: 3.6 g/dL (ref 3.5–5.0)
Alkaline Phosphatase: 60 U/L (ref 38–126)
Anion gap: 5 (ref 5–15)
BUN: 27 mg/dL — ABNORMAL HIGH (ref 8–23)
CO2: 34 mmol/L — ABNORMAL HIGH (ref 22–32)
Calcium: 9.7 mg/dL (ref 8.9–10.3)
Chloride: 104 mmol/L (ref 98–111)
Creatinine, Ser: 0.99 mg/dL (ref 0.61–1.24)
GFR, Estimated: >60 mL/min
Glucose, Bld: 121 mg/dL — ABNORMAL HIGH (ref 70–99)
Potassium: 4.6 mmol/L (ref 3.5–5.1)
Sodium: 143 mmol/L (ref 135–145)
Total Bilirubin: 0.4 mg/dL (ref 0.0–1.2)
Total Protein: 6.2 g/dL — ABNORMAL LOW (ref 6.5–8.1)

## 2024-08-07 LAB — TSH: TSH: 2.91 u[IU]/mL (ref 0.350–4.500)

## 2024-08-07 LAB — CBC WITH DIFFERENTIAL/PLATELET
Abs Immature Granulocytes: 0.12 K/uL — ABNORMAL HIGH (ref 0.00–0.07)
Basophils Absolute: 0.1 K/uL (ref 0.0–0.1)
Basophils Relative: 1 %
Eosinophils Absolute: 0.3 K/uL (ref 0.0–0.5)
Eosinophils Relative: 3 %
HCT: 48.1 % (ref 39.0–52.0)
Hemoglobin: 15.3 g/dL (ref 13.0–17.0)
Immature Granulocytes: 1 %
Lymphocytes Relative: 3 %
Lymphs Abs: 0.4 K/uL — ABNORMAL LOW (ref 0.7–4.0)
MCH: 32.3 pg (ref 26.0–34.0)
MCHC: 31.8 g/dL (ref 30.0–36.0)
MCV: 101.7 fL — ABNORMAL HIGH (ref 80.0–100.0)
Monocytes Absolute: 0.8 K/uL (ref 0.1–1.0)
Monocytes Relative: 6 %
Neutro Abs: 11.1 K/uL — ABNORMAL HIGH (ref 1.7–7.7)
Neutrophils Relative %: 86 %
Platelets: 277 K/uL (ref 150–400)
RBC: 4.73 MIL/uL (ref 4.22–5.81)
RDW: 14 % (ref 11.5–15.5)
WBC: 12.8 K/uL — ABNORMAL HIGH (ref 4.0–10.5)
nRBC: 0 % (ref 0.0–0.2)

## 2024-08-07 LAB — MAGNESIUM: Magnesium: 2.4 mg/dL (ref 1.7–2.4)

## 2024-08-07 MED ORDER — SODIUM CHLORIDE 0.9 % IV SOLN: Freq: Once | INTRAVENOUS | Status: AC

## 2024-08-07 MED ORDER — SODIUM CHLORIDE 0.9 % IV SOLN
400.0000 mg | Freq: Once | INTRAVENOUS | Status: AC
  Administered 2024-08-07: 400 mg via INTRAVENOUS
  Filled 2024-08-07: qty 16

## 2024-08-07 NOTE — Progress Notes (Signed)
Patient tolerated chemotherapy with no complaints voiced.  Side effects with management reviewed with understanding verbalized.  Peripheral IV site clean and dry with no bruising or swelling noted at site.  Good blood return noted before and after administration of chemotherapy.  Band aid applied.  Patient left in satisfactory condition with VSS and no s/s of distress noted.

## 2024-08-07 NOTE — Progress Notes (Signed)
 Labs meet parameters for treatment today. Ok to proceed

## 2024-08-07 NOTE — Patient Instructions (Signed)
 [facility] [place] - A DEPT OF [facility]  Discharge Instructions: Thank you for choosing [facility] to provide your oncology and hematology care.  If you have a lab appointment with the Cancer Center - please note that after April 8th, 2024, all labs will be drawn in the cancer center.  You do not have to check in or register with the main entrance as you have in the past but will complete your check-in in the cancer center.  Wear comfortable clothing and clothing appropriate for easy access to any Portacath or PICC line.   We strive to give you quality time with your provider. You may need to reschedule your appointment if you arrive late (15 or more minutes).  Arriving late affects you and other patients whose appointments are after yours.  Also, if you miss three or more appointments without notifying the office, you may be dismissed from the clinic at the provider's discretion.      For prescription refill requests, have your pharmacy contact our office and allow 72 hours for refills to be completed.    Today you received the following chemotherapy and/or immunotherapy agents keytruda     To help prevent nausea and vomiting after your treatment, we encourage you to take your nausea medication as directed.  BELOW ARE SYMPTOMS THAT SHOULD BE REPORTED IMMEDIATELY: *FEVER GREATER THAN 100.4 F (38 C) OR HIGHER *CHILLS OR SWEATING *NAUSEA AND VOMITING THAT IS NOT CONTROLLED WITH YOUR NAUSEA MEDICATION *UNUSUAL SHORTNESS OF BREATH *UNUSUAL BRUISING OR BLEEDING *URINARY PROBLEMS (pain or burning when urinating, or frequent urination) *BOWEL PROBLEMS (unusual diarrhea, constipation, pain near the anus) TENDERNESS IN MOUTH AND THROAT WITH OR WITHOUT PRESENCE OF ULCERS (sore throat, sores in mouth, or a toothache) UNUSUAL RASH, SWELLING OR PAIN  UNUSUAL VAGINAL DISCHARGE OR ITCHING   Items with * indicate a potential emergency and should be followed up as  soon as possible or go to the Emergency Department if any problems should occur.  Please show the CHEMOTHERAPY ALERT CARD or IMMUNOTHERAPY ALERT CARD at check-in to the Emergency Department and triage nurse.  Should you have questions after your visit or need to cancel or reschedule your appointment, please contact [facility] [place] - A DEPT OF [facility] [phone]  and follow the prompts.  Office hours are 8:00 a.m. to 4:30 p.m. Monday - Friday. Please note that voicemails left after 4:00 p.m. may not be returned until the following business day.  We are closed weekends and major holidays. You have access to a nurse at all times for urgent questions. Please call the main number to the clinic [phone] and follow the prompts.  For any non-urgent questions, you may also contact your provider using MyChart. We now offer e-Visits for anyone 47 and older to request care online for non-urgent symptoms. For details visit [email or web address].   Also download the MyChart app! Go to the app store, search MyChart, open the app, select  Run, and log in with your MyChart username and password.

## 2024-08-07 NOTE — Progress Notes (Signed)
 " Patient Care Team: Grooms, Charmaine, [place] as PCP - General (Physician Assistant) Waddell Danelle ORN, MD as PCP - Electrophysiology (Cardiology) Celestia Joesph SQUIBB, RN as Oncology Nurse Navigator (Medical Oncology)  Clinic Day:  08/08/2024  Referring physician: Bertell Satterfield, MD   CHIEF COMPLAINT:  CC: Metastatic urothelial carcinoma of the bladder to the lungs and bones    ASSESSMENT & PLAN:   Assessment & Plan: ARNOL MCGIBBON  is a 83 y.o. male with Metastatic urothelial carcinoma of the bladder to the lungs and bones  Malignant neoplasm of urinary bladder Metastatic urothelial carcinoma of the bladder to the lungs and bones s/p several lines of treatment.  Complicated oncology history as below Started on Keytruda  last year with for unexplained pleural effusions and lung nodules. Path unavailable and cytology from pleural effusion did not show any malignant cells   -Patient has no complications from Keytruda .  Tolerating well. -Re-discussed in detail with patient that patient does not have a biopsy-proven diagnosis at this time and that his pleural effusions are not improving.  He does have stable findings on CT scan.  Discussed giving a treatment break to see if patient has progression or consider biopsy.  Patient at this time decided to proceed with treatment until at least next scans. - Labs reviewed today: CMP: Stable, CBC: WBC: 12.8, Hb:15.3, PLT:277.  TSH: 2.910, T4: 7.5 -Physical exam stable today. Proceed with treatment today.  -CT scan done prior to visit but read pending at this time. Will call with results.  - Continue Keytruda  to 400 mg every 6 weeks.   Return to clinic in 6 weeks with labs prior to infusion  Pleural effusion Patient has recurrent pleural effusion in bilateral lungs along with some lung nodules. Cytology negative for malignant cells Getting frequent thoracentesis usually right greater than left, mostly weekly. Thoracentesis volumes have gone down  significantly.    - Patient is reluctant to get Pleurx catheter placed. - Continue weekly thoracentesis as scheduled.  Rash Patient has a history of immunotherapy induced rash and is on prednisone  10 mg daily   - Continue prednisone  10 mg daily -Continue hydroxyzine  25 mg 3 times a day as needed  Chronic cough Patient reports chronic cough that is responsive to Robitussin and guaifenesin  Likely secondary to pleural effusions   - Continue guaifenesin  and Robitussin as needed  The patient understands the plans discussed today and is in agreement with them.  He knows to contact our office if he develops concerns prior to his next appointment.  The total time spent in the appointment was 14 minutes for the encounter with patient, including review of chart and various tests results, discussions about plan of care and coordination of care plan   I,Helena R Teague,acting as a scribe for [name], MD.,have documented all relevant documentation on the behalf of [name], MD,as directed by  [name], MD while in the presence of [name], MD.  I, [name] MD, have reviewed the above documentation for accuracy and completeness, and I agree with the above.     [name], MD  [facility] [place] - A DEPT OF [facility] [address] [place] [address] Dept: [phone] Dept Fax: [phone]   No orders of the defined types were placed in this encounter.    ONCOLOGY HISTORY:   I have reviewed his chart and materials related to his cancer extensively and collaborated history with the patient.  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 There continues to be some borderline wall thickening in the  jejunum, query chronic inflammatory process such as celiac disease. Hepatic cysts. Right renal hypodense lesions are probably cysts but technically too small to characterize. -12/31/2014: Cystoscopy with TURBT of two tumors on left lateral wall of bladder, 2 cm and 3 cm in size.  Pathology of transurethral resection: High-grade papillary urothelial carcinoma. No definite areas of invasive disease are identified. Smooth muscle is present for evaluation. TURBT Pathology: High-grade papillary urothelial carcinoma. No invasive disease is identified.  Smooth muscle is present for histologic evaluation. -12/2014-04/2015: Induction intravesical BCG  -08/12/2015: Bladder biopsy. Pathology: Non-invasive high-grade papillary urothelial carcinoma. Muscularis propia is present and not involved.  -08/2015-08/2016: Induction intravesical BCG followed by maintenance BCG -03/21/2017: TURBT. Pathology: Invasive high-grade papillary urothelial carcinoma involving detrusor muscle.  -05/02/2017: Neoadjuvant chemotherapy with gemcitabine  and cisplatin . Just received 1 cycle and did not want further chemotherapy -06/29/2017: Cystoprostatectomy with bilateral lymphadenectomy.  Pathology: Infiltrative high-grade urothelial carcinoma, 2.5 cm tumor size. The carcinoma macroscopically invades perivesical soft tissue (pT3b). Lymphovascular and perineural invasion is identified. Margins of resection negative for carcinoma. 13/13 lymph nodes negative for metastatic carcinoma.  -10/2017: ??Likely MRI showing Right ischium lytic lesion -12/08/2017: Right ischium biopsy. Pathology: Poorly differentiated carcinoma. Comment: PD-L1 [id number] CPS: (90%) -01/16/2018: XRT to the pelvis completed -01/30/2018-05/22/2018: 6 cycles of Carboplatin  and gemcitabine  completed -03/31/2018: CT CAP: Redemonstration of bilateral pulmonary metastasis. The majority of nodules are similar in size. One index nodule has minimally decreased. Progression of right  inferior pubic ramus osseous metastasis. No new sites of soft tissue metastasis identified. -06/19/2018: MRI hip: Large expansile and destructive centrally necrotic mass of the right inferior pubic ramus, with pathologic fracture along its anterior margin, and with notable soft tissue extension. -06/21/2018-12/13/2018: Pembrolizumab  every 3 weeks, discontinued due to skin toxicity -01/24/2019: CT CAP: The dominant left upper lobe pulmonary nodule is stable but the other smaller pulmonary nodules demonstrate reduced size/conspicuity. Essentially stable lytic mass of the right inferior pubic ramus. -03/05/2020: CT CAP: Enlarging right upper lobe pulmonary nodule compatible with enlarging neoplasm. Whether this represents a metastatic lesion or a primary bronchogenic neoplasm is uncertain. There is also an enlarged right paratracheal lymph node measuring 1.9 cm in short axis, new compared to the prior study, concerning for nodal metastatic disease. Large lytic lesion in the right inferior pubic ramus, similar to prior examinations, compatible with a metastatic lesion. -04/23/2020: 50 Gray in 10 fractions XRT completed to right paratracheal lymph node -10/22/2021: PET:  Hypermetabolic subcarinal, AP window, and bilateral hilar adenopathy. The 1.4 cm subcarinal lymph node is particularly hypermetabolic maximum SUV 21.1. Low-grade activity in the right apical density, maximum SUV 3.1, probably representing sequela of prior radiation therapy. 9 mm anterior left upper lobe nodule has a maximum SUV of 1.6. The large lytic lesion of the right pubic bone and ischium is relatively photopenic, favoring treated malignancy. -11/16/2021-11/27/2021: SBRT to PET positive intrathoracic lymph nodes -03/15/2022: PET: Overall decrease in hypermetabolism involving mediastinal and hilar lymph nodes, compatible with response to therapy. Probable post treatment scarring in the apical segment right upper lobe. 10 mm nodular lesion in the  anterior left upper lobe. Focal anorectal hypermetabolism. Expansile lytic lesion and pathologic fracture involving the right inferior pubic ramus, without abnormal hypermetabolism, findings  possibly representing treated metastatic disease. -11/25/2022: PET: No evidence of local recurrence of bladder cancer or metastatic disease. A previously demonstrated mildly hypermetabolic subcarinal lymph node has decreased in metabolic activity. New patchy ground-glass opacities medially in both upper lobes with associated low level metabolic activity, likely inflammatory/infectious. Enlarging bilateral pleural effusions with increased dependent atelectasis in both lungs. Stable treated lytic lesion in the right inferior pubic ramus and ischium. -12/20/2022: CT angio chest: Improved right pleural effusion with a small residual status post thoracentesis. No pneumothorax. Persistent left-sided pleural effusion. Multifocal areas of parenchymal opacity are again noted. Separate spiculated 11 mm left upper lobe lung nodule. -12/20/2022: Right thoracentesis with 1.8 L of fluid removed -12/21/2022: Left thoracentesis with 700 mL of fluid removed. Cytology was negative for carcinoma.  (No path to prove urothelial carcinoma) -01/07/2023-current: Keytruda  200 mg every 3 weeks  -05/15/2024: Change Keytruda  to 400 mg every 6 weeks -03/27/2024: CT CAP: Post treatment collapse/consolidation in the posterior segment right upper lobe with a rounded masslike area of low attenuation posteromedially. Difficult to exclude residual disease. Stable anterior segment left upper lobe pulmonary nodule. Stable osseous metastatic disease. Cysto prostatectomy with a right lower quadrant ileal conduit. Large loculated right pleural effusion, stable, with rounded atelectasis in the posterior right lower lobe. Small to moderate left pleural effusion, decreased.  Current Treatment:  Keytruda  400 mg every 6 weeks  INTERVAL HISTORY:   [name] is a 83 y.o. male presenting to the clinic today for follow-up of metastatic urothelial carcinoma. Patient is accompanied by his wife today.   Ossiel is requiring thoracentesis about every 2 weeks. His wife notes he has had a productive cough since September 2025 and has been taking Mucinex  to control symptoms.   Gracyn appears well for treatment and he denies any recent fevers.   I have reviewed the past medical history, past surgical history, social history and family history with the patient and they are unchanged from previous note.  ALLERGIES:  is allergic to ciprofloxacin  and zofran  [ondansetron ].  MEDICATIONS:  Current Outpatient Medications  Medication Sig Dispense Refill   acetaminophen  (TYLENOL ) 500 MG tablet Take 1,000 mg by mouth every 6 (six) hours as needed for mild pain, fever, moderate pain or headache.     budesonide -formoterol  (SYMBICORT ) 160-4.5 MCG/ACT inhaler Inhale 2 puffs into the lungs 2 (two) times daily. 1 each 6   hydrOXYzine  (ATARAX ) 25 MG tablet Take 1 tablet (25 mg total) by mouth 3 (three) times daily as needed. 90 tablet 0   levothyroxine  (SYNTHROID ) 50 MCG tablet Take 50 mcg by mouth daily.     metoprolol  tartrate (LOPRESSOR ) 25 MG tablet Take 1 tablet (25 mg total) by mouth 2 (two) times daily. 60 tablet 2   pantoprazole  (PROTONIX ) 40 MG tablet Take 1 tablet (40 mg total) by mouth 2 (two) times daily. 60 tablet 0   predniSONE  (DELTASONE ) 10 MG tablet Take 1 tablet (10 mg total) by mouth daily with breakfast. 90 tablet 3   SSD 1 % cream Apply topically 2 (two) times daily.     Vitamin D , Ergocalciferol , (DRISDOL ) 1.25 MG (50000 UNIT) CAPS capsule Take 1 capsule (50,000 Units total) by mouth once a week. 6 capsule 1   Current Facility-Administered Medications  Medication Dose Route Frequency Provider Last Rate Last Admin   omalizumab  (XOLAIR ) prefilled syringe 300 mg  300 mg Subcutaneous Q14 Days Gallagher, Joel Louis, MD   300 mg at 11/24/22 0856     VITALS:  Blood pressure  139/77, pulse 89, temperature 98.6 F (37 C), temperature source Oral, resp. rate (!) 22, weight 195 lb 8.8 oz (88.7 kg), SpO2 94%.  Wt Readings from Last 3 Encounters:  08/07/24 195 lb 8.8 oz (88.7 kg)  07/16/24 188 lb 8 oz (85.5 kg)  06/27/24 190 lb (86.2 kg)    Body mass index is 26.89 kg/m.  Performance status (ECOG): 2 - Symptomatic, <50% confined to bed  PHYSICAL EXAM:   GENERAL:alert, no distress and comfortable SKIN:Erythematous rash- multiple papular, tiny in the inner side of his right upper arm. LYMPH:  no palpable lymphadenopathy in the cervical, axillary or inguinal LUNGS: Rales present in right upper lobe, decreased breath sounds in left lower lobe.  HEART: regular rate & rhythm and no murmurs and no lower extremity edema ABDOMEN:abdomen soft, non-tender and normal bowel sounds Musculoskeletal:no cyanosis of digits and no clubbing  NEURO: alert & oriented x 3 with fluent speech  LABORATORY DATA:  I have reviewed the data as listed    Component Value Date/Time   NA 143 08/07/2024 0921   NA 140 04/30/2019 1033   NA 141 05/23/2017 0828   K 4.6 08/07/2024 0921   K 4.0 05/23/2017 0828   CL 104 08/07/2024 0921   CO2 34 (H) 08/07/2024 0921   CO2 27 05/23/2017 0828   GLUCOSE 121 (H) 08/07/2024 0921   GLUCOSE 97 05/23/2017 0828   BUN 27 (H) 08/07/2024 0921   BUN 16 04/30/2019 1033   BUN 12.5 05/23/2017 0828   CREATININE 0.99 08/07/2024 0921   CREATININE 1.07 07/02/2022 0935   CREATININE 0.8 05/23/2017 0828   CALCIUM 9.7 08/07/2024 0921   CALCIUM 9.0 05/23/2017 0828   PROT 6.2 (L) 08/07/2024 0921   PROT 5.7 (L) 04/30/2019 1033   PROT 5.6 (L) 05/23/2017 0828   ALBUMIN 3.6 08/07/2024 0921   ALBUMIN 3.7 04/30/2019 1033   ALBUMIN 2.5 (L) 05/23/2017 0828   AST 16 08/07/2024 0921   AST 13 (L) 07/02/2022 0935   AST 13 05/23/2017 0828   ALT 12 08/07/2024 0921   ALT 15 07/02/2022 0935   ALT 15 05/23/2017 0828   ALKPHOS 60 08/07/2024  0921   ALKPHOS 57 05/23/2017 0828   BILITOT 0.4 08/07/2024 0921   BILITOT 0.5 07/02/2022 0935   BILITOT 0.37 05/23/2017 0828   GFRNONAA >60 08/07/2024 0921   GFRNONAA >60 07/02/2022 0935   GFRAA >60 03/05/2020 1407     Lab Results  Component Value Date   WBC 12.8 (H) 08/07/2024   NEUTROABS 11.1 (H) 08/07/2024   HGB 15.3 08/07/2024   HCT 48.1 08/07/2024   MCV 101.7 (H) 08/07/2024   PLT 277 08/07/2024    Latest Reference Range & Units 08/07/24 09:20 08/07/24 09:21  TSH 0.350 - 4.500 uIU/mL 2.910   Thyroxine (T4) 4.5 - 12.0 ug/dL  7.5    RADIOGRAPHIC STUDIES: I have personally reviewed the radiological images as listed and agreed with the findings in the report.  CT CHEST ABDOMEN PELVIS W CONTRAST CLINICAL DATA:  Metastatic disease evaluation. * Tracking Code: BO *  EXAM: CT CHEST, ABDOMEN, AND PELVIS WITH CONTRAST  TECHNIQUE: Multidetector CT imaging of the chest, abdomen and pelvis was performed following the standard protocol during bolus administration of intravenous contrast.  RADIATION DOSE REDUCTION: This exam was performed according to the departmental dose-optimization program which includes automated exposure control, adjustment of the mA and/or kV according to patient size and/or use of iterative reconstruction technique.  CONTRAST:  OMNIPAQUE  IOHEXOL  300  MG/ML  SOLN  COMPARISON:  CT scan chest, abdomen and pelvis from 03/27/2024.  FINDINGS: CT CHEST FINDINGS  Cardiovascular: Normal cardiac size. No pericardial effusion. No aortic aneurysm. There are coronary artery calcifications, in keeping with coronary artery disease. There are also mild peripheral atherosclerotic vascular calcifications of thoracic aorta and its major branches.  Mediastinum/Nodes: Visualized thyroid  gland appears grossly unremarkable. No solid / cystic mediastinal masses. The esophagus is nondistended precluding optimal assessment. There is a small sliding hiatal hernia.  There is a stable prevascular lymph node measuring 8 x 13 mm (series 2, image 32). No new axillary, mediastinal or hilar lymphadenopathy by size criteria.  Lungs/Pleura: The central tracheo-bronchial tree is patent. There is mild, smooth, circumferential thickening of the segmental and subsegmental bronchial walls, throughout bilateral lungs, which is nonspecific. Findings are most commonly seen with bronchitis or reactive airway disease, such as asthma. Redemonstration of chronic volume loss in the right hemithorax. There is loculated moderate right pleural effusion which overall appears essentially unchanged since the prior study. There are associated atelectatic changes in the right upper lobe and rounded atelectasis in the right lower lobe, also unchanged. Redemonstration of heterogeneous mixed peripherally hyperattenuating and centrally hypoattenuating masslike area in the right upper lobe, posteromedially measuring 2.9 x 3.3 cm, which previously measured up to 2.5 x 3.3 cm. There is also stable irregular heterogeneous soft tissue surrounding the central right bronchial tree without occlusion.  There is also small-to-moderate left pleural effusion, overall increased since the prior study. There is an irregular solid noncalcified nodule in the anterior segment of left upper lobe measuring 6 x 8 mm which previously measured up to 4 x 7 mm. There is stable subpleural atelectasis in the inferior aspect of left upper lobe anterior segment (series 5, image 80). Otherwise, no new mass, consolidation, or suspicious nodules in the left lung.  Musculoskeletal: Bilateral mild-to-moderate symmetric gynecomastia noted. The visualized soft tissues of the chest wall are grossly unremarkable. No suspicious osseous lesions. There are mild to moderate multilevel degenerative changes in the visualized spine. Redemonstration of mild superior endplate compression deformity of T7 vertebrae. No  significant retropulsion or spinal canal compromise.  CT ABDOMEN PELVIS FINDINGS  Hepatobiliary: The liver is normal in size. Non-cirrhotic configuration. No suspicious mass. There are stable 4 simple cysts in the liver with largest in the segment 6 measuring up to 2.0 x 2.6 cm. No intrahepatic or extrahepatic bile duct dilation. Redemonstration of several subcentimeter sized calcified dependent gallstones without imaging signs of acute cholecystitis.  Pancreas: There is multiple dystrophic calcifications mainly in the pancreatic head/uncinate process, likely sequela of previous episodes of pancreatitis. The pancreas is otherwise unremarkable. No focal mass, peripancreatic fat stranding or peripancreatic collection.  Spleen: Within normal limits. No focal lesion.  Adrenals/Urinary Tract: Adrenal glands are unremarkable. Mild diffuse atrophy of bilateral kidneys. No suspicious mass. There several scattered subcentimeter sized simple cysts in bilateral kidneys. No nephroureterolithiasis or obstructive uropathy. Surgically absent urinary bladder. Right midabdomen urostomy noted.  Stomach/Bowel: No disproportionate dilation of the small or large bowel loops. No evidence of abnormal bowel wall thickening or inflammatory changes. The appendix was not visualized; however there is no acute inflammatory process in the right lower quadrant. Right lower quadrant enteroenteric anastomosis noted likely related to urostomy. There is a small diverticulum arising from the second part of duodenum.  Vascular/Lymphatic: No ascites or pneumoperitoneum. No abdominal or pelvic lymphadenopathy, by size criteria. No aneurysmal dilation of the major abdominal arteries. There are moderate peripheral atherosclerotic  vascular calcifications of the aorta and its major branches.  Reproductive: Patient is status post surgical removal of prostate and bilateral seminal vesicles.  Other: Right mid abdomen  urostomy. Periumbilical surgical scar. The soft tissues and abdominal wall are otherwise unremarkable.  Musculoskeletal: Redemonstration of expansile lytic lesion with destruction of right inferior pubic ramus, essentially similar to the prior study. There is associated soft tissue component, which is also grossly unchanged. There are mild - moderate multilevel degenerative changes in the visualized spine.  IMPRESSION: 1. Redemonstration of heterogeneous masslike area in the right upper lobe, posteromedially, which is slightly increased in size since the prior study. There is also interval increase in the size of left upper lobe nodule. 2. Stable loculated right pleural effusion and right lung atelectasis. 3. Interval increase in the size of small-to-moderate left pleural effusion. 4. Stable expansile lytic lesion with destruction of right inferior pubic ramus. 5. No new metastatic disease identified within the abdomen or pelvis. 6. Multiple other nonacute observations, as described above.  Aortic Atherosclerosis (ICD10-I70.0).  Electronically Signed   By: Ree Molt M.D.   On: 08/07/2024 10:28    "

## 2024-08-08 ENCOUNTER — Encounter: Payer: Self-pay | Admitting: Oncology

## 2024-08-08 LAB — T4: T4, Total: 7.5 ug/dL (ref 4.5–12.0)

## 2024-08-13 ENCOUNTER — Encounter: Payer: Self-pay | Admitting: *Deleted

## 2024-08-13 ENCOUNTER — Other Ambulatory Visit: Payer: Self-pay | Admitting: *Deleted

## 2024-08-13 DIAGNOSIS — C679 Malignant neoplasm of bladder, unspecified: Secondary | ICD-10-CM

## 2024-08-13 DIAGNOSIS — C771 Secondary and unspecified malignant neoplasm of intrathoracic lymph nodes: Secondary | ICD-10-CM

## 2024-08-13 DIAGNOSIS — R918 Other nonspecific abnormal finding of lung field: Secondary | ICD-10-CM

## 2024-08-17 ENCOUNTER — Ambulatory Visit (HOSPITAL_COMMUNITY)
Admission: RE | Admit: 2024-08-17 | Discharge: 2024-08-17 | Disposition: A | Discharge status: Home / Self Care | Source: Ambulatory Visit | Attending: Physician Assistant | Admitting: Physician Assistant

## 2024-08-17 ENCOUNTER — Encounter (HOSPITAL_COMMUNITY): Payer: Self-pay

## 2024-08-17 ENCOUNTER — Ambulatory Visit (HOSPITAL_COMMUNITY)
Admission: RE | Admit: 2024-08-17 | Discharge: 2024-08-17 | Disposition: A | Discharge status: Home / Self Care | Source: Ambulatory Visit | Attending: Oncology | Admitting: Oncology

## 2024-08-17 DIAGNOSIS — J90 Pleural effusion, not elsewhere classified: Secondary | ICD-10-CM | POA: Insufficient documentation

## 2024-08-17 MED ORDER — LIDOCAINE HCL (PF) 2 % IJ SOLN
INTRAMUSCULAR | Status: AC
  Filled 2024-08-17: qty 10

## 2024-08-17 MED ORDER — LIDOCAINE HCL (PF) 2 % IJ SOLN
10.0000 mL | Freq: Once | INTRAMUSCULAR | Status: AC
  Administered 2024-08-17: 10 mL

## 2024-08-17 NOTE — Progress Notes (Signed)
 Patient tolerated Right Thoracentesis procedure well today and 700 mL of clear yellow pleural fluid removed. Patient verbalized understanding of discharge instructions and transported via wheelchair to xray at this time for post chest xray with no acute distress noted.

## 2024-08-17 NOTE — Procedures (Signed)
 PROCEDURE SUMMARY:  Successful image-guided right thoracentesis. Yielded 700 milliliters of clear yellow fluid. Patient tolerated procedure well. EBL < 1 mL. No immediate complications.  Specimen was not sent for labs. Post procedure CXR shows no pneumothorax.  Please see imaging section of Epic for full dictation.  Clotilda DELENA Hesselbach PA-C 08/17/2024 11:50 AM

## 2024-08-28 ENCOUNTER — Other Ambulatory Visit: Payer: Self-pay | Admitting: Physician Assistant

## 2024-08-31 ENCOUNTER — Other Ambulatory Visit (HOSPITAL_COMMUNITY): Payer: Self-pay | Admitting: Oncology

## 2024-08-31 ENCOUNTER — Encounter (HOSPITAL_COMMUNITY): Payer: Self-pay

## 2024-08-31 ENCOUNTER — Ambulatory Visit (HOSPITAL_COMMUNITY)
Admission: RE | Admit: 2024-08-31 | Discharge: 2024-08-31 | Disposition: A | Discharge status: Home / Self Care | Source: Ambulatory Visit | Attending: Oncology | Admitting: Oncology

## 2024-08-31 DIAGNOSIS — R52 Pain, unspecified: Secondary | ICD-10-CM

## 2024-08-31 DIAGNOSIS — J90 Pleural effusion, not elsewhere classified: Secondary | ICD-10-CM | POA: Insufficient documentation

## 2024-08-31 MED ORDER — LIDOCAINE HCL (PF) 2 % IJ SOLN
INTRAMUSCULAR | Status: AC
  Filled 2024-08-31: qty 10

## 2024-08-31 NOTE — Procedures (Signed)
 PROCEDURE SUMMARY:  Successful image-guided right thoracentesis. Yielded 700 milliliters of clear yellow fluid. Patient tolerated procedure well. EBL < 1 mL. No immediate complications.  Specimen was not sent for labs. Post procedure CXR shows no pneumothorax.  Please see imaging section of Epic for full dictation.  Clotilda DELENA Hesselbach PA-C 08/31/2024 11:45 AM

## 2024-08-31 NOTE — Progress Notes (Signed)
 Patient brought to US  1 in no obvious distress. Thoracentesis explained, consent obtained. Prepped and draped in sterile manner. Local anesthetic admin without adverse reaction. Access obtained without difficulty. Clear amber serous fluid retrieved under vacutainer suction. 700cc total removed without distress. Access removed, bandage placed. Escorted to radiology waiting area for to DC to spouse. No SOB, no obvious distress.

## 2024-09-06 ENCOUNTER — Ambulatory Visit (HOSPITAL_COMMUNITY)
Admission: RE | Admit: 2024-09-06 | Discharge: 2024-09-06 | Disposition: A | Discharge status: Home / Self Care | Source: Ambulatory Visit | Attending: Oncology | Admitting: Oncology

## 2024-09-06 DIAGNOSIS — R918 Other nonspecific abnormal finding of lung field: Secondary | ICD-10-CM | POA: Insufficient documentation

## 2024-09-06 DIAGNOSIS — C771 Secondary and unspecified malignant neoplasm of intrathoracic lymph nodes: Secondary | ICD-10-CM | POA: Diagnosis present

## 2024-09-06 DIAGNOSIS — C679 Malignant neoplasm of bladder, unspecified: Secondary | ICD-10-CM | POA: Diagnosis present

## 2024-09-06 MED ORDER — FLUDEOXYGLUCOSE F - 18 (FDG) INJECTION
10.3500 | Freq: Once | INTRAVENOUS | Status: AC | PRN
  Administered 2024-09-06: 10.35 via INTRAVENOUS

## 2024-09-13 ENCOUNTER — Inpatient Hospital Stay: Admitting: Oncology

## 2024-09-14 ENCOUNTER — Ambulatory Visit (HOSPITAL_COMMUNITY)

## 2024-09-18 ENCOUNTER — Inpatient Hospital Stay

## 2024-09-18 ENCOUNTER — Inpatient Hospital Stay: Admitting: Oncology

## 2024-09-21 ENCOUNTER — Ambulatory Visit (HOSPITAL_COMMUNITY): Admission: RE | Admit: 2024-09-21 | Source: Ambulatory Visit

## 2024-09-21 DIAGNOSIS — J90 Pleural effusion, not elsewhere classified: Secondary | ICD-10-CM

## 2024-09-21 MED ORDER — LIDOCAINE HCL (PF) 2 % IJ SOLN
10.0000 mL | Freq: Once | INTRAMUSCULAR | Status: AC
  Administered 2024-09-21: 10 mL via INTRADERMAL

## 2024-09-21 MED ORDER — LIDOCAINE HCL (PF) 2 % IJ SOLN
INTRAMUSCULAR | Status: AC
  Filled 2024-09-21: qty 10

## 2024-09-21 NOTE — Progress Notes (Signed)
 Patient brought to US  RM1 per WC in no acute distress. Thoracentesis explained, consent obtained. Prepped and draped in sterile fashion. US  imaging obtained. Local anesthetic admin without complication. Access obtained at Right post thorax without difficulty. Clear amber serous fluid withdrawn under vacutainer suction. 650cc total removed, tolerated well. Access withdrawn without complication, bandage placed. No bleeding or hematoma. Traqnsported to xray.

## 2024-09-21 NOTE — Procedures (Signed)
 PROCEDURE SUMMARY:  Successful US  guided right thoracentesis. Yielded 650 mL of clear, yellow fluid. Pt tolerated procedure well. No immediate complications.  Specimen was not sent for labs. CXR ordered.  EBL < 5 mL  Solmon Selmer Ku PA-C 09/21/2024 11:51 AM

## 2024-09-25 ENCOUNTER — Inpatient Hospital Stay: Attending: Oncology

## 2024-09-25 ENCOUNTER — Inpatient Hospital Stay: Admitting: Oncology

## 2024-09-26 ENCOUNTER — Inpatient Hospital Stay

## 2024-10-12 ENCOUNTER — Ambulatory Visit: Admitting: Urology

## 2024-10-12 ENCOUNTER — Ambulatory Visit (HOSPITAL_COMMUNITY)

## 2024-10-17 ENCOUNTER — Ambulatory Visit: Admitting: Urology

## 2024-11-02 ENCOUNTER — Other Ambulatory Visit (HOSPITAL_COMMUNITY)

## 2024-11-07 ENCOUNTER — Inpatient Hospital Stay

## 2024-11-07 ENCOUNTER — Inpatient Hospital Stay: Admitting: Oncology

## 2025-01-14 ENCOUNTER — Ambulatory Visit: Admitting: Physician Assistant
